# Patient Record
Sex: Male | Born: 1960 | Race: Black or African American | Hispanic: No | Smoking: Former smoker
Health system: Southern US, Community
[De-identification: ages and names within clinical notes are randomized; demographics above are authoritative.]

## PROBLEM LIST (undated history)

## (undated) DIAGNOSIS — I350 Nonrheumatic aortic (valve) stenosis: Secondary | ICD-10-CM

## (undated) DIAGNOSIS — R0600 Dyspnea, unspecified: Secondary | ICD-10-CM

## (undated) DIAGNOSIS — E119 Type 2 diabetes mellitus without complications: Secondary | ICD-10-CM

## (undated) DIAGNOSIS — K000 Anodontia: Secondary | ICD-10-CM

## (undated) DIAGNOSIS — R011 Cardiac murmur, unspecified: Secondary | ICD-10-CM

## (undated) DIAGNOSIS — J45909 Unspecified asthma, uncomplicated: Secondary | ICD-10-CM

## (undated) DIAGNOSIS — J449 Chronic obstructive pulmonary disease, unspecified: Secondary | ICD-10-CM

## (undated) DIAGNOSIS — K08109 Complete loss of teeth, unspecified cause, unspecified class: Secondary | ICD-10-CM

## (undated) DIAGNOSIS — E785 Hyperlipidemia, unspecified: Secondary | ICD-10-CM

## (undated) DIAGNOSIS — A0472 Enterocolitis due to Clostridium difficile, not specified as recurrent: Secondary | ICD-10-CM

## (undated) DIAGNOSIS — I739 Peripheral vascular disease, unspecified: Secondary | ICD-10-CM

## (undated) DIAGNOSIS — F1910 Other psychoactive substance abuse, uncomplicated: Secondary | ICD-10-CM

## (undated) DIAGNOSIS — H409 Unspecified glaucoma: Secondary | ICD-10-CM

## (undated) DIAGNOSIS — L309 Dermatitis, unspecified: Secondary | ICD-10-CM

## (undated) DIAGNOSIS — G709 Myoneural disorder, unspecified: Secondary | ICD-10-CM

## (undated) DIAGNOSIS — J42 Unspecified chronic bronchitis: Secondary | ICD-10-CM

## (undated) DIAGNOSIS — M1990 Unspecified osteoarthritis, unspecified site: Secondary | ICD-10-CM

## (undated) DIAGNOSIS — D649 Anemia, unspecified: Secondary | ICD-10-CM

## (undated) DIAGNOSIS — F419 Anxiety disorder, unspecified: Secondary | ICD-10-CM

## (undated) DIAGNOSIS — I10 Essential (primary) hypertension: Secondary | ICD-10-CM

## (undated) DIAGNOSIS — I2510 Atherosclerotic heart disease of native coronary artery without angina pectoris: Secondary | ICD-10-CM

## (undated) HISTORY — DX: Myoneural disorder, unspecified: G70.9

## (undated) HISTORY — DX: Essential (primary) hypertension: I10

## (undated) HISTORY — DX: Hyperlipidemia, unspecified: E78.5

## (undated) HISTORY — DX: Complete loss of teeth, unspecified cause, unspecified class: K08.109

## (undated) HISTORY — DX: Enterocolitis due to Clostridium difficile, not specified as recurrent: A04.72

## (undated) HISTORY — PX: GLAUCOMA SURGERY: SHX656

## (undated) HISTORY — DX: Dermatitis, unspecified: L30.9

## (undated) HISTORY — PX: APPENDECTOMY: SHX54

## (undated) HISTORY — DX: Other psychoactive substance abuse, uncomplicated: F19.10

## (undated) HISTORY — PX: MULTIPLE TOOTH EXTRACTIONS: SHX2053

## (undated) HISTORY — DX: Anodontia: K00.0

## (undated) HISTORY — PX: SKIN GRAFT: SHX250

---

## 2014-05-21 ENCOUNTER — Encounter (HOSPITAL_COMMUNITY): Payer: Self-pay | Admitting: Emergency Medicine

## 2014-05-21 ENCOUNTER — Emergency Department (HOSPITAL_COMMUNITY): Payer: Medicare (Managed Care)

## 2014-05-21 ENCOUNTER — Emergency Department (HOSPITAL_COMMUNITY)
Admission: EM | Admit: 2014-05-21 | Discharge: 2014-05-21 | Disposition: A | Discharge status: Home / Self Care | Payer: Medicare (Managed Care) | Attending: Emergency Medicine | Admitting: Emergency Medicine

## 2014-05-21 DIAGNOSIS — K529 Noninfective gastroenteritis and colitis, unspecified: Secondary | ICD-10-CM

## 2014-05-21 DIAGNOSIS — F17200 Nicotine dependence, unspecified, uncomplicated: Secondary | ICD-10-CM | POA: Insufficient documentation

## 2014-05-21 DIAGNOSIS — E119 Type 2 diabetes mellitus without complications: Secondary | ICD-10-CM | POA: Insufficient documentation

## 2014-05-21 DIAGNOSIS — Z9089 Acquired absence of other organs: Secondary | ICD-10-CM | POA: Insufficient documentation

## 2014-05-21 DIAGNOSIS — J45909 Unspecified asthma, uncomplicated: Secondary | ICD-10-CM | POA: Diagnosis not present

## 2014-05-21 DIAGNOSIS — R1031 Right lower quadrant pain: Secondary | ICD-10-CM | POA: Diagnosis not present

## 2014-05-21 DIAGNOSIS — K5289 Other specified noninfective gastroenteritis and colitis: Secondary | ICD-10-CM | POA: Insufficient documentation

## 2014-05-21 DIAGNOSIS — R109 Unspecified abdominal pain: Secondary | ICD-10-CM | POA: Diagnosis present

## 2014-05-21 DIAGNOSIS — Z79899 Other long term (current) drug therapy: Secondary | ICD-10-CM | POA: Diagnosis not present

## 2014-05-21 HISTORY — DX: Unspecified asthma, uncomplicated: J45.909

## 2014-05-21 LAB — COMPREHENSIVE METABOLIC PANEL
ALT: 35 U/L (ref 0–53)
AST: 25 U/L (ref 0–37)
Albumin: 3.5 g/dL (ref 3.5–5.2)
Alkaline Phosphatase: 145 U/L — ABNORMAL HIGH (ref 39–117)
Anion gap: 14 (ref 5–15)
BUN: 15 mg/dL (ref 6–23)
CO2: 23 mEq/L (ref 19–32)
Calcium: 8.9 mg/dL (ref 8.4–10.5)
Chloride: 101 mEq/L (ref 96–112)
Creatinine, Ser: 0.89 mg/dL (ref 0.50–1.35)
GFR calc Af Amer: >90 mL/min (ref >90)
GFR calc non Af Amer: >90 mL/min (ref >90)
Glucose, Bld: 259 mg/dL — ABNORMAL HIGH (ref 70–99)
Potassium: 4.7 mEq/L (ref 3.7–5.3)
Sodium: 138 mEq/L (ref 137–147)
Total Bilirubin: 0.2 mg/dL — ABNORMAL LOW (ref 0.3–1.2)
Total Protein: 7.7 g/dL (ref 6.0–8.3)

## 2014-05-21 LAB — URINALYSIS, ROUTINE W REFLEX MICROSCOPIC
Bilirubin Urine: NEGATIVE
Glucose, UA: NEGATIVE mg/dL
Hgb urine dipstick: NEGATIVE
Ketones, ur: NEGATIVE mg/dL
Leukocytes, UA: NEGATIVE
Nitrite: NEGATIVE
Protein, ur: NEGATIVE mg/dL
Specific Gravity, Urine: 1.021 (ref 1.005–1.030)
Urobilinogen, UA: 1 mg/dL (ref 0.0–1.0)
pH: 5.5 (ref 5.0–8.0)

## 2014-05-21 LAB — CBC WITH DIFFERENTIAL/PLATELET
Basophils Absolute: 0 10*3/uL (ref 0.0–0.1)
Basophils Relative: 1 % (ref 0–1)
Eosinophils Absolute: 0.6 10*3/uL (ref 0.0–0.7)
Eosinophils Relative: 13 % — ABNORMAL HIGH (ref 0–5)
HCT: 38.4 % — ABNORMAL LOW (ref 39.0–52.0)
Hemoglobin: 12.6 g/dL — ABNORMAL LOW (ref 13.0–17.0)
Lymphocytes Relative: 23 % (ref 12–46)
Lymphs Abs: 1.1 10*3/uL (ref 0.7–4.0)
MCH: 27.4 pg (ref 26.0–34.0)
MCHC: 32.8 g/dL (ref 30.0–36.0)
MCV: 83.5 fL (ref 78.0–100.0)
Monocytes Absolute: 0.5 10*3/uL (ref 0.1–1.0)
Monocytes Relative: 11 % (ref 3–12)
Neutro Abs: 2.5 10*3/uL (ref 1.7–7.7)
Neutrophils Relative %: 52 % (ref 43–77)
Platelets: 256 10*3/uL (ref 150–400)
RBC: 4.6 MIL/uL (ref 4.22–5.81)
RDW: 12.8 % (ref 11.5–15.5)
WBC: 4.7 10*3/uL (ref 4.0–10.5)

## 2014-05-21 LAB — LIPASE, BLOOD: Lipase: 34 U/L (ref 11–59)

## 2014-05-21 MED ORDER — IBUPROFEN 800 MG PO TABS: 800.0000 mg | ORAL_TABLET | Freq: Three times a day (TID) | ORAL | Status: DC | PRN

## 2014-05-21 MED ORDER — HYDROMORPHONE HCL PF 1 MG/ML IJ SOLN
1.0000 mg | Freq: Once | INTRAMUSCULAR | Status: AC
  Administered 2014-05-21: 1 mg via INTRAVENOUS
  Filled 2014-05-21: qty 1

## 2014-05-21 MED ORDER — IOHEXOL 300 MG/ML  SOLN
100.0000 mL | Freq: Once | INTRAMUSCULAR | Status: AC | PRN
  Administered 2014-05-21: 100 mL via INTRAVENOUS

## 2014-05-21 MED ORDER — METRONIDAZOLE 500 MG PO TABS: 500.0000 mg | ORAL_TABLET | Freq: Two times a day (BID) | ORAL | Status: DC

## 2014-05-21 MED ORDER — CIPROFLOXACIN HCL 500 MG PO TABS: 500.0000 mg | ORAL_TABLET | Freq: Two times a day (BID) | ORAL | Status: DC

## 2014-05-21 MED ORDER — OXYCODONE-ACETAMINOPHEN 5-325 MG PO TABS: 1.0000 | ORAL_TABLET | Freq: Four times a day (QID) | ORAL | Status: DC | PRN

## 2014-05-21 MED ORDER — SODIUM CHLORIDE 0.9 % IV BOLUS (SEPSIS)
1000.0000 mL | Freq: Once | INTRAVENOUS | Status: AC
  Administered 2014-05-21: 1000 mL via INTRAVENOUS

## 2014-05-21 MED ORDER — ONDANSETRON HCL 4 MG/2ML IJ SOLN
4.0000 mg | Freq: Once | INTRAMUSCULAR | Status: AC
  Administered 2014-05-21: 4 mg via INTRAVENOUS
  Filled 2014-05-21: qty 2

## 2014-05-21 NOTE — ED Notes (Signed)
Pt continues to be monitored by blood pressure and pulse ox. Pt asked to provide urine specimen. Pt stated he could not provide one at this time. Pt has urinal at bedside.

## 2014-05-21 NOTE — Discharge Instructions (Signed)

## 2014-05-21 NOTE — ED Provider Notes (Signed)
Medical screening examination/treatment/procedure(s) were performed by non-physician practitioner and as supervising physician I was immediately available for consultation/collaboration.   EKG Interpretation None        Hoy Morn, MD 05/21/14 2209

## 2014-05-21 NOTE — ED Notes (Signed)
Pt placed into gown and on monitor upon arrival to room. Pt monitored by blood pressure and pulse ox.  

## 2014-05-21 NOTE — ED Notes (Addendum)
Pt reportgs having abd pain for a long time with periods of it getting severe; pt crying and guarding at triage; pt reports being seen at [place] [facility] yesterday for same

## 2014-05-21 NOTE — ED Provider Notes (Signed)
CSN: [id number]     Arrival date & time 05/21/14  1305 History   First MD Initiated Contact with Patient 05/21/14 1328     Chief Complaint  Patient presents with  . Abdominal Pain     (Consider location/radiation/quality/duration/timing/severity/associated sxs/prior Treatment) HPI Patient presents to the emergency department with right lower abdominal pain in the area where his appendectomy scar is located.  Patient, states the pain is a burning type pain.  Patient, states, that he has not had fever, chest pain, shortness of breath, weakness, dizziness back pain, dysuria, nausea, vomiting, diarrhea, blurred vision, headache, or ectopy.  The patient, states, that he did not take any medications prior to arrival.  He states he was seen one month ago in [facility] for similar symptoms.  He states the pain medicine, again helped, but he ran out of the medicine Past Medical History  Diagnosis Date  . Asthma   . Diabetes mellitus without complication    Past Surgical History  Procedure Laterality Date  . Skin graft    . Appendectomy     History reviewed. No pertinent family history. History  Substance Use Topics  . Smoking status: Current Every Day Smoker -- 0.50 packs/day    Types: Cigarettes  . Smokeless tobacco: Not on file  . Alcohol Use: No    Review of Systems  All other systems negative except as documented in the HPI. All pertinent positives and negatives as reviewed in the HPI.  Allergies  Review of patient's allergies indicates no known allergies.  Home Medications   Prior to Admission medications   Medication Sig Start Date End Date Taking? Authorizing Provider  albuterol (PROVENTIL HFA;VENTOLIN HFA) 108 (90 BASE) MCG/ACT inhaler Inhale 2 puffs into the lungs every 6 (six) hours as needed for wheezing or shortness of breath.   Yes Historical Provider, MD  docusate sodium (COLACE) 100 MG capsule Take 200 mg by mouth daily as needed for mild constipation.   Yes  Historical Provider, MD  glipiZIDE (GLUCOTROL) 10 MG tablet Take 10 mg by mouth 2 (two) times daily.   Yes Historical Provider, MD  metFORMIN (GLUCOPHAGE) 1000 MG tablet Take 1,000 mg by mouth 2 (two) times daily with a meal.   Yes Historical Provider, MD  pregabalin (LYRICA) 75 MG capsule Take 75 mg by mouth 2 (two) times daily.   Yes Historical Provider, MD  Sennosides (LAXATIVE) 25 MG TABS Take 1 tablet by mouth daily as needed (for constipation).   Yes Historical Provider, MD   BP 138/70  Pulse 87  Temp(Src) 98.7 F (37.1 C) (Oral)  Resp 16  Ht 6\' 3"  (1.905 m)  Wt 208 lb (94.348 kg)  BMI 26.00 kg/m2  SpO2 97% Physical Exam  Nursing note and vitals reviewed. Constitutional: He appears well-developed and well-nourished. No distress.  HENT:  Head: Normocephalic and atraumatic.  Mouth/Throat: Oropharynx is clear and moist.  Eyes: Pupils are equal, round, and reactive to light.  Neck: Normal range of motion. Neck supple.  Cardiovascular: Normal rate, regular rhythm and normal heart sounds.  Exam reveals no gallop and no friction rub.   No murmur heard. Pulmonary/Chest: Effort normal and breath sounds normal. No respiratory distress.  Abdominal: Soft. Normal appearance and bowel sounds are normal. He exhibits no distension. There is tenderness. There is no rebound and no guarding. No hernia.    Skin: Skin is warm and dry. No rash noted. No erythema.    ED Course  Procedures (including critical care  time) Labs Review Labs Reviewed  CBC WITH DIFFERENTIAL - Abnormal; Notable for the following:    Hemoglobin 12.6 (*)    HCT 38.4 (*)    Eosinophils Relative 13 (*)    All other components within normal limits  COMPREHENSIVE METABOLIC PANEL - Abnormal; Notable for the following:    Glucose, Bld 259 (*)    Alkaline Phosphatase 145 (*)    Total Bilirubin 0.2 (*)    All other components within normal limits  LIPASE, BLOOD  URINALYSIS, ROUTINE W REFLEX MICROSCOPIC    Imaging  Review No results found.  The patient has relief of his symptoms following pain medication.  He is awaiting CT scan.   Brent General, PA-C 05/21/14 [phone]

## 2014-05-21 NOTE — ED Notes (Signed)
Pt continues to be monitored by blood pressure and pulse ox. Pt asked to provide urine specimen pt stated he could not provide one at this time. Pt provided with urinal.

## 2014-05-21 NOTE — ED Provider Notes (Signed)
[name]  Pain has been ongoing for 1 month, persistent, burning sensation, nonradiating, progressively worse now that he does not have his pain medication. No other associated symptoms. CT scan ordered.  Patient lives in [facility].  6:53 PM CT scan demonstrated a mild bowel wall thickening of the descending colon and sigmoid colon which is nonspecific but suggestive of a mild segmental colitis. Ischemic colitis is also on the differential although the mesenteric vasculature appear quite patent. With this specific finding, patient will be discharged with Cipro and Flagyl, pain medication, and recommend close followup with PCP for further care. At this time patient is stable for discharge. Return precautions discussed.  Pt has normal renal function and likely able to tolerates cipro.  BP 128/78  Pulse 81  Temp(Src) 98.7 F (37.1 C) (Oral)  Resp 16  Ht 6\' 3"  (1.905 m)  Wt 208 lb (94.348 kg)  BMI 26.00 kg/m2  SpO2 97%  I have reviewed nursing notes and vital signs. I personally reviewed the imaging tests through PACS system  I reviewed available ER/hospitalization records thought the EMR  Results for orders placed during the hospital encounter of 05/21/14  CBC WITH DIFFERENTIAL      Result Value Ref Range   WBC 4.7  4.0 - 10.5 K/uL   RBC 4.60  4.22 - 5.81 MIL/uL   Hemoglobin 12.6 (*) 13.0 - 17.0 g/dL   HCT 38.4 (*) 39.0 - 52.0 %   MCV 83.5  78.0 - 100.0 fL   MCH 27.4  26.0 - 34.0 pg   MCHC 32.8  30.0 - 36.0 g/dL   RDW 12.8  11.5 - 15.5 %   Platelets 256  150 - 400 K/uL   Neutrophils Relative % 52  43 - 77 %   Neutro Abs 2.5  1.7 - 7.7 K/uL   Lymphocytes Relative 23  12 - 46 %   Lymphs Abs 1.1  0.7 - 4.0 K/uL   Monocytes Relative 11  3 - 12 %   Monocytes Absolute 0.5  0.1 - 1.0 K/uL   Eosinophils Relative 13 (*) 0 - 5 %   Eosinophils Absolute 0.6  0.0 - 0.7 K/uL   Basophils Relative 1  0 - 1 %   Basophils  Absolute 0.0  0.0 - 0.1 K/uL  COMPREHENSIVE METABOLIC PANEL      Result Value Ref Range   Sodium 138  137 - 147 mEq/L   Potassium 4.7  3.7 - 5.3 mEq/L   Chloride 101  96 - 112 mEq/L   CO2 23  19 - 32 mEq/L   Glucose, Bld 259 (*) 70 - 99 mg/dL   BUN 15  6 - 23 mg/dL   Creatinine, Ser 0.89  0.50 - 1.35 mg/dL   Calcium 8.9  8.4 - 10.5 mg/dL   Total Protein 7.7  6.0 - 8.3 g/dL   Albumin 3.5  3.5 - 5.2 g/dL   AST 25  0 - 37 U/L   ALT 35  0 - 53 U/L   Alkaline Phosphatase 145 (*) 39 - 117 U/L   Total Bilirubin 0.2 (*) 0.3 - 1.2 mg/dL   GFR calc non Af Amer >90  >90 mL/min   GFR calc Af Amer >90  >90 mL/min   Anion gap 14  5 - 15  LIPASE, BLOOD      Result Value Ref Range   Lipase 34  11 -  59 U/L  URINALYSIS, ROUTINE W REFLEX MICROSCOPIC      Result Value Ref Range   Color, Urine YELLOW  YELLOW   APPearance CLEAR  CLEAR   Specific Gravity, Urine 1.021  1.005 - 1.030   pH 5.5  5.0 - 8.0   Glucose, UA NEGATIVE  NEGATIVE mg/dL   Hgb urine dipstick NEGATIVE  NEGATIVE   Bilirubin Urine NEGATIVE  NEGATIVE   Ketones, ur NEGATIVE  NEGATIVE mg/dL   Protein, ur NEGATIVE  NEGATIVE mg/dL   Urobilinogen, UA 1.0  0.0 - 1.0 mg/dL   Nitrite NEGATIVE  NEGATIVE   Leukocytes, UA NEGATIVE  NEGATIVE   Ct Abdomen Pelvis W Contrast  05/21/2014   CLINICAL DATA:  Severe abdominal pain,  EXAM: CT ABDOMEN AND PELVIS WITH CONTRAST  TECHNIQUE: Multidetector CT imaging of the abdomen and pelvis was performed using the standard protocol following bolus administration of intravenous contrast.  CONTRAST:  167mL OMNIPAQUE IOHEXOL 300 MG/ML  SOLN  COMPARISON:  CT 05/07/2014  FINDINGS: Lung bases are clear.  No pericardial fluid.  No focal hepatic lesion. Gallbladder is collapsed. The pancreas, spleen, adrenal glands, kidneys are normal.  The stomach, small bowel, and cecum are normal. Colon and rectosigmoid colon are normal. There is mild thickening of the bowel wall involving the mid descending colon and sigmoid  colon. Bowel wall thickening measure 4 to 6 mm on image 66.  Abdominal aorta normal caliber. The SMA and celiac trunk are widely patent. The IMA is patent. No retroperitoneal periportal lymphadenopathy.  No free fluid the pelvis. Moderate volume of stool in the rectum. Prostate gland and bladder normal. No pelvic lymphadenopathy. No inguinal hernia.  IMPRESSION: 1. Mild bowel wall thickening of the descending colon adn sigmoid colon is nonspecific but is suggestive of mild segmental colitis. Differential would include inflammatory infectious colitis including inflammatory bowel disease. Ischemic colitis is also in the differential although the mesenteric vasculature appears widely patent. 2. Gallbladder is collapsed. 3. Patient status post appendectomy.   Electronically Signed   By: Suzy Bouchard M.D.   On: 05/21/2014 18:31      Domenic Moras, PA-C 05/21/14 1859

## 2014-05-21 NOTE — ED Notes (Signed)
Pts vital signs updated pts iv removed pt awaiting discharge paperwork at bedside.

## 2014-05-21 NOTE — ED Notes (Signed)
Pt remains monitored by blood pressure and pulse ox.  

## 2014-05-22 NOTE — ED Provider Notes (Signed)
Medical screening examination/treatment/procedure(s) were performed by non-physician practitioner and as supervising physician I was immediately available for consultation/collaboration.   EKG Interpretation None       Orlie Dakin, MD 05/22/14 [phone]

## 2014-05-24 ENCOUNTER — Telehealth (HOSPITAL_BASED_OUTPATIENT_CLINIC_OR_DEPARTMENT_OTHER): Payer: Self-pay | Admitting: Emergency Medicine

## 2014-06-04 ENCOUNTER — Emergency Department (HOSPITAL_COMMUNITY)
Admission: EM | Admit: 2014-06-04 | Discharge: 2014-06-05 | Disposition: A | Discharge status: Home / Self Care | Payer: Medicare (Managed Care) | Attending: Emergency Medicine | Admitting: Emergency Medicine

## 2014-06-04 ENCOUNTER — Encounter (HOSPITAL_COMMUNITY): Payer: Self-pay | Admitting: Emergency Medicine

## 2014-06-04 DIAGNOSIS — E119 Type 2 diabetes mellitus without complications: Secondary | ICD-10-CM | POA: Diagnosis not present

## 2014-06-04 DIAGNOSIS — Z792 Long term (current) use of antibiotics: Secondary | ICD-10-CM | POA: Insufficient documentation

## 2014-06-04 DIAGNOSIS — R1031 Right lower quadrant pain: Secondary | ICD-10-CM | POA: Insufficient documentation

## 2014-06-04 DIAGNOSIS — F17200 Nicotine dependence, unspecified, uncomplicated: Secondary | ICD-10-CM | POA: Diagnosis not present

## 2014-06-04 DIAGNOSIS — Z79899 Other long term (current) drug therapy: Secondary | ICD-10-CM | POA: Diagnosis not present

## 2014-06-04 DIAGNOSIS — J45909 Unspecified asthma, uncomplicated: Secondary | ICD-10-CM | POA: Insufficient documentation

## 2014-06-04 DIAGNOSIS — Z8719 Personal history of other diseases of the digestive system: Secondary | ICD-10-CM | POA: Insufficient documentation

## 2014-06-04 DIAGNOSIS — R109 Unspecified abdominal pain: Secondary | ICD-10-CM

## 2014-06-04 LAB — CBC WITH DIFFERENTIAL/PLATELET
Basophils Absolute: 0 10*3/uL (ref 0.0–0.1)
Basophils Relative: 1 % (ref 0–1)
Eosinophils Absolute: 0.5 10*3/uL (ref 0.0–0.7)
Eosinophils Relative: 12 % — ABNORMAL HIGH (ref 0–5)
HCT: 39 % (ref 39.0–52.0)
Hemoglobin: 12.8 g/dL — ABNORMAL LOW (ref 13.0–17.0)
Lymphocytes Relative: 19 % (ref 12–46)
Lymphs Abs: 0.8 10*3/uL (ref 0.7–4.0)
MCH: 26.9 pg (ref 26.0–34.0)
MCHC: 32.8 g/dL (ref 30.0–36.0)
MCV: 81.9 fL (ref 78.0–100.0)
Monocytes Absolute: 0.6 10*3/uL (ref 0.1–1.0)
Monocytes Relative: 13 % — ABNORMAL HIGH (ref 3–12)
Neutro Abs: 2.5 10*3/uL (ref 1.7–7.7)
Neutrophils Relative %: 57 % (ref 43–77)
Platelets: 280 10*3/uL (ref 150–400)
RBC: 4.76 MIL/uL (ref 4.22–5.81)
RDW: 12.7 % (ref 11.5–15.5)
WBC: 4.4 10*3/uL (ref 4.0–10.5)

## 2014-06-04 LAB — COMPREHENSIVE METABOLIC PANEL
ALT: 44 U/L (ref 0–53)
AST: 32 U/L (ref 0–37)
Albumin: 3.5 g/dL (ref 3.5–5.2)
Alkaline Phosphatase: 135 U/L — ABNORMAL HIGH (ref 39–117)
Anion gap: 12 (ref 5–15)
BUN: 10 mg/dL (ref 6–23)
CO2: 26 mEq/L (ref 19–32)
Calcium: 9.1 mg/dL (ref 8.4–10.5)
Chloride: 98 mEq/L (ref 96–112)
Creatinine, Ser: 0.81 mg/dL (ref 0.50–1.35)
GFR calc Af Amer: >90 mL/min (ref >90)
GFR calc non Af Amer: >90 mL/min (ref >90)
Glucose, Bld: 138 mg/dL — ABNORMAL HIGH (ref 70–99)
Potassium: 4.3 mEq/L (ref 3.7–5.3)
Sodium: 136 mEq/L — ABNORMAL LOW (ref 137–147)
Total Bilirubin: 0.2 mg/dL — ABNORMAL LOW (ref 0.3–1.2)
Total Protein: 7.7 g/dL (ref 6.0–8.3)

## 2014-06-04 LAB — URINALYSIS, ROUTINE W REFLEX MICROSCOPIC
Bilirubin Urine: NEGATIVE
Glucose, UA: NEGATIVE mg/dL
Hgb urine dipstick: NEGATIVE
Ketones, ur: 15 mg/dL — AB
Leukocytes, UA: NEGATIVE
Nitrite: NEGATIVE
Protein, ur: NEGATIVE mg/dL
Specific Gravity, Urine: 1.029 (ref 1.005–1.030)
Urobilinogen, UA: 0.2 mg/dL (ref 0.0–1.0)
pH: 5.5 (ref 5.0–8.0)

## 2014-06-04 LAB — LIPASE, BLOOD: Lipase: 20 U/L (ref 11–59)

## 2014-06-04 MED ORDER — KETOROLAC TROMETHAMINE 15 MG/ML IJ SOLN
15.0000 mg | Freq: Once | INTRAMUSCULAR | Status: AC
  Administered 2014-06-04: 15 mg via INTRAMUSCULAR
  Filled 2014-06-04: qty 1

## 2014-06-04 MED ORDER — OXYCODONE-ACETAMINOPHEN 5-325 MG PO TABS
1.0000 | ORAL_TABLET | Freq: Once | ORAL | Status: AC
  Administered 2014-06-04: 1 via ORAL
  Filled 2014-06-04: qty 1

## 2014-06-04 MED ORDER — HYDROMORPHONE HCL PF 1 MG/ML IJ SOLN
1.0000 mg | Freq: Once | INTRAMUSCULAR | Status: AC
  Administered 2014-06-04: 1 mg via INTRAMUSCULAR
  Filled 2014-06-04: qty 1

## 2014-06-04 MED ORDER — TRAMADOL HCL 50 MG PO TABS: 50.0000 mg | ORAL_TABLET | Freq: Four times a day (QID) | ORAL | Status: DC | PRN

## 2014-06-04 MED ORDER — FENTANYL CITRATE 0.05 MG/ML IJ SOLN
50.0000 ug | Freq: Once | INTRAMUSCULAR | Status: AC
  Administered 2014-06-04: 50 ug via NASAL
  Filled 2014-06-04: qty 2

## 2014-06-04 NOTE — ED Notes (Signed)
Pt reports being seen here on 8/14 for RLQ pain, ct scan showed colitis. Reports taking all meds as prescribed but still having severe pain and now diarrhea.

## 2014-06-04 NOTE — ED Provider Notes (Signed)
CSN: [id number]     Arrival date & time 06/04/14  1658 History   First MD Initiated Contact with Patient 06/04/14 2229     Chief Complaint  Patient presents with  . Abdominal Pain     (Consider location/radiation/quality/duration/timing/severity/associated sxs/prior Treatment) HPI  53 year old male with abdominal pain. Right sided. Ongoing for over a month. Patient was recently seen in the emergency room and a CT abdomen pelvis which was significant for segmental colitis. He was treated with pain medication and antibiotics. Reports that the pain initially improved somewhat but not completely go away. He is out of his pain medication and is requesting more. No fevers or chills. No vomiting or diarrhea. No urinary complaints. Patient reports recently relocated from [place]. No established local care.  Past Medical History  Diagnosis Date  . Asthma   . Diabetes mellitus without complication    Past Surgical History  Procedure Laterality Date  . Skin graft    . Appendectomy     History reviewed. No pertinent family history. History  Substance Use Topics  . Smoking status: Current Every Day Smoker -- 0.50 packs/day    Types: Cigarettes  . Smokeless tobacco: Not on file  . Alcohol Use: No    Review of Systems  All systems reviewed and negative, other than as noted in HPI.   Allergies  Review of patient's allergies indicates no known allergies.  Home Medications   Prior to Admission medications   Medication Sig Start Date End Date Taking? Authorizing Provider  albuterol (PROVENTIL HFA;VENTOLIN HFA) 108 (90 BASE) MCG/ACT inhaler Inhale 2 puffs into the lungs every 6 (six) hours as needed for wheezing or shortness of breath.   Yes Historical Provider, MD  docusate sodium (COLACE) 100 MG capsule Take 200 mg by mouth daily as needed for mild constipation.   Yes Historical Provider, MD  glipiZIDE (GLUCOTROL) 10 MG tablet Take 10 mg by mouth 2 (two) times daily.   Yes  Historical Provider, MD  ibuprofen (ADVIL,MOTRIN) 800 MG tablet Take 800 mg by mouth every 8 (eight) hours as needed for moderate pain. 05/21/14  Yes Resa Miner Lawyer, PA-C  metFORMIN (GLUCOPHAGE) 1000 MG tablet Take 1,000 mg by mouth 2 (two) times daily with a meal.   Yes Historical Provider, MD  oxyCODONE-acetaminophen (PERCOCET/ROXICET) 5-325 MG per tablet Take 1 tablet by mouth every 6 (six) hours as needed for severe pain. 05/21/14  Yes Resa Miner Lawyer, PA-C  pregabalin (LYRICA) 75 MG capsule Take 75 mg by mouth 2 (two) times daily.   Yes Historical Provider, MD  Sennosides (LAXATIVE) 25 MG TABS Take 1 tablet by mouth daily as needed (for constipation).   Yes Historical Provider, MD  ciprofloxacin (CIPRO) 500 MG tablet Take 500 mg by mouth 2 (two) times daily. Completed course of medication on 06-03-14 05/21/14   Domenic Moras, PA-C  metroNIDAZOLE (FLAGYL) 500 MG tablet Take 500 mg by mouth 2 (two) times daily. Completed course of medication on 06-03-14 05/21/14   Domenic Moras, PA-C   BP 144/84  Pulse 78  Temp(Src) 99.2 F (37.3 C) (Oral)  Resp 20  SpO2 100% Physical Exam  Nursing note and vitals reviewed. Constitutional: He appears well-developed and well-nourished. No distress.  Laying in bed. No acute distress. Talkative/pleasant  HENT:  Head: Normocephalic and atraumatic.  Eyes: Conjunctivae are normal. Right eye exhibits no discharge. Left eye exhibits no discharge.  Neck: Neck supple.  Cardiovascular: Normal rate, regular rhythm and normal heart sounds.  Exam reveals  no gallop and no friction rub.   No murmur heard. Pulmonary/Chest: Effort normal and breath sounds normal. No respiratory distress.  Abdominal: Soft. He exhibits no distension. There is tenderness.  Mild right-sided abdominal tenderness without rebound or guarding. When distracted patient with talking and asking more questions palpation in same area did not illicit any obvious response.   Genitourinary:  No CVA  tenderness  Musculoskeletal: He exhibits no edema and no tenderness.  Neurological: He is alert.  Skin: Skin is warm and dry.  Psychiatric: He has a normal mood and affect. His behavior is normal. Thought content normal.    ED Course  Procedures (including critical care time) Labs Review Labs Reviewed  CBC WITH DIFFERENTIAL - Abnormal; Notable for the following:    Hemoglobin 12.8 (*)    Monocytes Relative 13 (*)    Eosinophils Relative 12 (*)    All other components within normal limits  COMPREHENSIVE METABOLIC PANEL - Abnormal; Notable for the following:    Sodium 136 (*)    Glucose, Bld 138 (*)    Alkaline Phosphatase 135 (*)    Total Bilirubin 0.2 (*)    All other components within normal limits  URINALYSIS, ROUTINE W REFLEX MICROSCOPIC - Abnormal; Notable for the following:    Color, Urine AMBER (*)    Ketones, ur 15 (*)    All other components within normal limits  LIPASE, BLOOD    Imaging Review No results found.   EKG Interpretation None      MDM   Final diagnoses:  Abdominal pain, unspecified abdominal location    53 year old male with abdominal pain. Recently seen for the same complaints. CT then significant for colitis. He reports compliance with his medications that his pain has been persistent. He is essentially here for pain medication. He is afebrile. He appears well. He does have some tenderness on exam but is actually distractible. He requested something to eat in the emergency room and had a [place] sandwich and ate it w/o complaint. Whether this is continued pain from previously noted colitis versus something else I am not sure. My suspicion for abscess, perforation or other potential MRSA pathology is very low. Overall he appears very well. His workup today has been pretty reassuring. Do not feel that he needs further imaging at this point. Will treat his pain. Return precautions were discussed. I feel is appropriate for discharge at this time.  Virgel Manifold, MD 06/09/14 1343

## 2014-06-04 NOTE — Discharge Instructions (Signed)
Abdominal Pain Many things can cause abdominal pain. Usually, abdominal pain is not caused by a disease and will improve without treatment. It can often be observed and treated at home. Your health care provider will do a physical exam and possibly order blood tests and X-rays to help determine the seriousness of your pain. However, in many cases, more time must pass before a clear cause of the pain can be found. Before that point, your health care provider may not know if you need more testing or further treatment. HOME CARE INSTRUCTIONS  Monitor your abdominal pain for any changes. The following actions may help to alleviate any discomfort you are experiencing:  Only take over-the-counter or prescription medicines as directed by your health care provider.  Do not take laxatives unless directed to do so by your health care provider.  Try a clear liquid diet (broth, tea, or water) as directed by your health care provider. Slowly move to a bland diet as tolerated. SEEK MEDICAL CARE IF:  You have unexplained abdominal pain.  You have abdominal pain associated with nausea or diarrhea.  You have pain when you urinate or have a bowel movement.  You experience abdominal pain that wakes you in the night.  You have abdominal pain that is worsened or improved by eating food.  You have abdominal pain that is worsened with eating fatty foods.  You have a fever. SEEK IMMEDIATE MEDICAL CARE IF:   Your pain does not go away within 2 hours.  You keep throwing up (vomiting).  Your pain is felt only in portions of the abdomen, such as the right side or the left lower portion of the abdomen.  You pass bloody or black tarry stools. MAKE SURE YOU:  Understand these instructions.   Will watch your condition.   Will get help right away if you are not doing well or get worse.  Document Released: 07/04/2005 Document Revised: 09/29/2013 Document Reviewed: 06/03/2013 [facility] Patient Information  2015 [place], [place]. This information is not intended to replace advice given to you by your health care provider. Make sure you discuss any questions you have with your health care provider.   Emergency Department Resource Guide 1) Find a Doctor and Pay Out of Pocket Although you won't have to find out who is covered by your insurance plan, it is a good idea to ask around and get recommendations. You will then need to call the office and see if the doctor you have chosen will accept you as a new patient and what types of options they offer for patients who are self-pay. Some doctors offer discounts or will set up payment plans for their patients who do not have insurance, but you will need to ask so you aren't surprised when you get to your appointment.  2) Contact Your Local Health Department Not all health departments have doctors that can see patients for sick visits, but many do, so it is worth a call to see if yours does. If you don't know where your local health department is, you can check in your phone book. The CDC also has a tool to help you locate your state's health department, and many state websites also have listings of all of their local health departments.  3) Find a [facility] If your illness is not likely to be very severe or complicated, you may want to try a walk in clinic. These are popping up all over the country in pharmacies, drugstores, and shopping centers. They're  usually staffed by nurse practitioners or physician assistants that have been trained to treat common illnesses and complaints. They're usually fairly quick and inexpensive. However, if you have serious medical issues or chronic medical problems, these are probably not your best option.  No Primary Care Doctor: - Call Health Connect at  [phone] - they can help you locate a primary care doctor that  accepts your insurance, provides certain services, etc. - Physician Referral Service- [phone]  Chronic  Pain Problems: Organization         Address  Phone   Notes  [facility]  [phone] Patients need to be referred by their primary care doctor.   Medication Assistance: Organization         Address  Phone   Notes  [facility] Medication [facility] [address] [place], [place] [address] [phone] --Must be a resident of [facility] -- Must have NO insurance coverage whatsoever (no Medicaid/ Medicare, etc.) -- The pt. MUST have a primary care doctor that directs their care regularly and follows them in the community   [facility]ist  [phone]   [employer]  [phone]    Agencies that provide inexpensive medical care: Organization         Address  Phone   Notes  [place]  [phone]   [facility]    [phone]   [facility] [place], [place] [address] [phone]   [place] [address] [place] [phone]   Planned Parenthood    [phone]   [facility]    [phone]   [place] and [place] [address], [place] Phone:  [phone], Fax:  [phone] Hours of Operation:  9 am - 6 pm, M-F.  Also accepts Medicaid/Medicare and self-pay.  [facility] for [place], [address], [place] Phone: [phone], Fax: [phone]. Hours of Operation:  8:30 am - 5:30 pm, M-F.  Also accepts Medicaid and self-pay.  [facility] [address], [place] Phone: [phone]   [place], [place], [place] [phone], Ext. [phone] Mondays & Thursdays: 7-9 AM.  First 15 patients are seen on a first come, first serve basis.    [place] Providers:  Organization         Address  Phone   Notes  [facility] [address], [place] [phone] Also  accepts self-pay patients.  [facility] [id number] [place], [place]  [phone]   [place], [address], [place] [phone]   [facility] [address] [place] [phone]   Lucianne Lei [address], [place]   [phone] Only accepts [place] Access [place] patients after they have their name applied to their card.   Self-Pay (no insurance) in [facility]:  Organization         Address  Phone   Notes  Sickle Cell Patients, [facility] Internal Medicine [place] [phone]   [facility] [place] [phone]   [facility] Urgent [place]  [place], [address], [place] [phone]   [facility]/Dr. Osei-Bonsu  [address], [place] or [address], [place] [phone] Phone number for both [place] and [place] locations is the same.  [facility] [address], [place] [phone]   [facility] [address], [place] or [address] [phone]   [facility] [address] [place] [phone], phone; [phone], fax Sees patients 1st and 3rd Saturday of every month.  Must not qualify for public or private insurance (i.e. Medicaid, Medicare, [place] Health Choice, Veterans' Benefits)  Household income should be no more than 200% of the poverty level The clinic cannot treat you if you are pregnant or think you are pregnant  Sexually transmitted diseases are not treated at the clinic.    Dental Care: Organization         Address  Phone  Notes  [facility] of [facility] [place] [phone] Accepts children up to age 33 who are enrolled in [place] or [place]; pregnant  women with a Medicaid card; and children who have applied for Medicaid or [place] Health Choice, but were declined, whose parents can pay a reduced fee at time of service.  [facility] Department of [facility]  [address], [place] [phone] Accepts children up to age 48 who are enrolled in [place] or [place]; pregnant women with a Medicaid card; and children who have applied for Medicaid or [place] Health Choice, but were declined, whose parents can pay a reduced fee at time of service.  [place] Adult Dental Access PROGRAM  [place] [phone] Patients are seen by appointment only. Walk-ins are not accepted. [place] will see patients 42 years of age and older. Monday - Tuesday (8am-5pm) Most Wednesdays (8:30-5pm) $30 per visit, cash only  [facility] Adult Dental Access PROGRAM  [address], [facility] [phone] Patients are seen by appointment only. Walk-ins are not accepted. [place] will see patients 31 years of age and older. One Wednesday Evening (Monthly: Volunteer Based).  $30 per visit, cash only  [place]  [phone] for adults; Children under age 65, call Graduate Pediatric Dentistry at [phone]. Children aged 5-14, please call [phone] to request a pediatric application.  Dental services are provided in all areas of dental care including fillings, crowns and bridges, complete and partial dentures, implants, gum treatment, root canals, and extractions. Preventive care is also provided. Treatment is provided to both adults and children. Patients are selected via a lottery and there is often a waiting list.   [facility] [address], [place]  [phone] [email or web address]   [facility] [address] [place], [place] [phone], Ext. [phone] Second and Fourth Thursday of each month, opens at 6:30 AM; Clinic ends at 9 AM.  Patients are  seen on a first-come first-served basis, and a limited number are seen during each clinic.   [facility]  [address] Hillard Danker [place], [place] [phone]   Eligibility Requirements You must have lived in [place], [place], or [place] counties for at least the last three months.   You cannot be eligible for state or federal sponsored [employer], including [employer], [place], or [employer].   You generally cannot be eligible for healthcare insurance through  your employer.    How to apply: Eligibility screenings are held every Tuesday and Wednesday afternoon from 1:00 pm until 4:00 pm. You do not need an appointment for the interview!  [facility] [address] [place], [place]   [place]  [place] Department  [place]  [phone]    Behavioral Health Resources in the Community: Intensive Outpatient Programs Organization         Address  Phone  Notes  [place]. [address] [place], [place] [phone]   [facility] Outpatient [address] [place], [place]   ADS: Alcohol & Drug Svcs [address], [place], [place]   [place] [address],  [place], [place] or [phone]   Substance Abuse Resources Organization         Address  Phone  Notes  Alcohol and Drug Services  [phone]   [place]  [phone]   [place]   Chinita Pester  [phone]   Residential & Outpatient Substance Abuse Program  [phone]   Psychological Services Organization         Address  Phone  Notes  [facility] [place]  [place]  [phone]   [place] [address], [place] or [phone]    Mobile Crisis  Teams Organization         Address  Phone  Notes  [facility], Mobile Crisis Care Unit  [phone]   [facility]ive Psychotherapeutic Services  [address] [place], [place]   [place] [address], [place] [phone]    Self-Help/Support Groups Organization         Address  Phone             Notes  [place]. of [place] - variety of support groups  [place] Call for more information  Narcotics Anonymous (NA), [facility] [address], [employer]   2 meetings at this location   Special educational needs teacher         Address  Phone  Notes  [facility]idential [facility] [place],    [place]  [phone]   [facility]  [address], [place] [phone], [place], [place]   [place], [place] [phone] Admissions: 8am-3pm M-F  Incentives Substance [place] [address],    [place], [place] [phone]   [facility] [address] [place], [place], [place]   [facility] [address],  [place], [place]   [facility] - Intensive Outpatient [address], [place], [place]   [facility].,  [place], [place] [phone] or [phone]   [facility] Services (RTS) [address] [place], [place] Accepts Medicaid  [facility] [place] [address],  [place] [phone] Substance Abuse/Addiction Treatment   [facility] Organization         Address  Phone  Notes  [facility]  [phone]   Domenic Schwab, PhD [address] [place], [place]   [phone] or [phone]   [place], [place] [phone]  [facility] [address], [place], [place] [phone]  Insurance/Medicaid/sponsorship through [facility]  °[facility] [address]                                    [place], [place] [phone] Therapy/tele-psych/case  °[facility] [address]  ° [place], [place] [phone]    °Dr. Arfeen  [phone]   °[facility] [place]  [employer] [place] [facility] 1) [address],  °2) [address], [place] °3)  [address], [place] [phone] °([phone] ° °([phone]   °[place] Child Abuse Hotline [phone]) [phone] or ([phone] (After Hours)    ° ° ° ° °

## 2014-09-06 ENCOUNTER — Emergency Department (HOSPITAL_COMMUNITY): Payer: Medicare (Managed Care)

## 2014-09-06 ENCOUNTER — Encounter (HOSPITAL_COMMUNITY): Payer: Self-pay | Admitting: *Deleted

## 2014-09-06 ENCOUNTER — Inpatient Hospital Stay (HOSPITAL_COMMUNITY)
Admission: EM | Admit: 2014-09-06 | Discharge: 2014-09-09 | DRG: 373 | Disposition: A | Discharge status: Home / Self Care | Payer: Medicare (Managed Care) | Attending: Internal Medicine | Admitting: Internal Medicine

## 2014-09-06 DIAGNOSIS — I10 Essential (primary) hypertension: Secondary | ICD-10-CM | POA: Diagnosis present

## 2014-09-06 DIAGNOSIS — A047 Enterocolitis due to Clostridium difficile: Secondary | ICD-10-CM | POA: Diagnosis present

## 2014-09-06 DIAGNOSIS — R1013 Epigastric pain: Secondary | ICD-10-CM | POA: Diagnosis not present

## 2014-09-06 DIAGNOSIS — E119 Type 2 diabetes mellitus without complications: Secondary | ICD-10-CM

## 2014-09-06 DIAGNOSIS — Z79899 Other long term (current) drug therapy: Secondary | ICD-10-CM

## 2014-09-06 DIAGNOSIS — K56609 Unspecified intestinal obstruction, unspecified as to partial versus complete obstruction: Secondary | ICD-10-CM

## 2014-09-06 DIAGNOSIS — A0472 Enterocolitis due to Clostridium difficile, not specified as recurrent: Secondary | ICD-10-CM | POA: Diagnosis present

## 2014-09-06 DIAGNOSIS — R109 Unspecified abdominal pain: Secondary | ICD-10-CM

## 2014-09-06 DIAGNOSIS — F17210 Nicotine dependence, cigarettes, uncomplicated: Secondary | ICD-10-CM | POA: Diagnosis present

## 2014-09-06 DIAGNOSIS — J45909 Unspecified asthma, uncomplicated: Secondary | ICD-10-CM | POA: Diagnosis present

## 2014-09-06 DIAGNOSIS — K529 Noninfective gastroenteritis and colitis, unspecified: Secondary | ICD-10-CM

## 2014-09-06 DIAGNOSIS — J4520 Mild intermittent asthma, uncomplicated: Secondary | ICD-10-CM

## 2014-09-06 LAB — CBC WITH DIFFERENTIAL/PLATELET
Basophils Absolute: 0 10*3/uL (ref 0.0–0.1)
Basophils Relative: 0 % (ref 0–1)
Eosinophils Absolute: 0.9 10*3/uL — ABNORMAL HIGH (ref 0.0–0.7)
Eosinophils Relative: 11 % — ABNORMAL HIGH (ref 0–5)
HEMATOCRIT: 44.4 % (ref 39.0–52.0)
Hemoglobin: 14.9 g/dL (ref 13.0–17.0)
LYMPHS ABS: 1.1 10*3/uL (ref 0.7–4.0)
LYMPHS PCT: 14 % (ref 12–46)
MCH: 27.1 pg (ref 26.0–34.0)
MCHC: 33.6 g/dL (ref 30.0–36.0)
MCV: 80.9 fL (ref 78.0–100.0)
Monocytes Absolute: 1 10*3/uL (ref 0.1–1.0)
Monocytes Relative: 13 % — ABNORMAL HIGH (ref 3–12)
NEUTROS ABS: 4.8 10*3/uL (ref 1.7–7.7)
Neutrophils Relative %: 62 % (ref 43–77)
PLATELETS: 332 10*3/uL (ref 150–400)
RBC: 5.49 MIL/uL (ref 4.22–5.81)
RDW: 13.1 % (ref 11.5–15.5)
WBC: 7.8 10*3/uL (ref 4.0–10.5)

## 2014-09-06 LAB — URINALYSIS, ROUTINE W REFLEX MICROSCOPIC
GLUCOSE, UA: 100 mg/dL — AB
Hgb urine dipstick: NEGATIVE
KETONES UR: 15 mg/dL — AB
Leukocytes, UA: NEGATIVE
Nitrite: NEGATIVE
Protein, ur: NEGATIVE mg/dL
Specific Gravity, Urine: 1.03 (ref 1.005–1.030)
Urobilinogen, UA: 0.2 mg/dL (ref 0.0–1.0)
pH: 5 (ref 5.0–8.0)

## 2014-09-06 LAB — COMPREHENSIVE METABOLIC PANEL
ALK PHOS: 120 U/L — AB (ref 39–117)
ALT: 26 U/L (ref 0–53)
AST: 18 U/L (ref 0–37)
Albumin: 3.4 g/dL — ABNORMAL LOW (ref 3.5–5.2)
Anion gap: 16 — ABNORMAL HIGH (ref 5–15)
BUN: 22 mg/dL (ref 6–23)
CO2: 21 meq/L (ref 19–32)
Calcium: 8.9 mg/dL (ref 8.4–10.5)
Chloride: 95 mEq/L — ABNORMAL LOW (ref 96–112)
Creatinine, Ser: 1.12 mg/dL (ref 0.50–1.35)
GFR, EST AFRICAN AMERICAN: 84 mL/min — AB (ref >90)
GFR, EST NON AFRICAN AMERICAN: 72 mL/min — AB (ref >90)
GLUCOSE: 174 mg/dL — AB (ref 70–99)
Potassium: 4.8 mEq/L (ref 3.7–5.3)
SODIUM: 132 meq/L — AB (ref 137–147)
Total Bilirubin: 0.4 mg/dL (ref 0.3–1.2)
Total Protein: 7.9 g/dL (ref 6.0–8.3)

## 2014-09-06 LAB — I-STAT CG4 LACTIC ACID, ED: LACTIC ACID, VENOUS: 0.88 mmol/L (ref 0.5–2.2)

## 2014-09-06 LAB — LIPASE, BLOOD: Lipase: 19 U/L (ref 11–59)

## 2014-09-06 MED ORDER — ACETAMINOPHEN 325 MG PO TABS: 650.0000 mg | ORAL_TABLET | Freq: Four times a day (QID) | ORAL | Status: DC | PRN

## 2014-09-06 MED ORDER — HYDROMORPHONE HCL 1 MG/ML IJ SOLN
1.0000 mg | Freq: Once | INTRAMUSCULAR | Status: AC
  Administered 2014-09-06: 1 mg via INTRAVENOUS
  Filled 2014-09-06: qty 1

## 2014-09-06 MED ORDER — METRONIDAZOLE IN NACL 5-0.79 MG/ML-% IV SOLN
500.0000 mg | Freq: Once | INTRAVENOUS | Status: AC
  Administered 2014-09-06: 500 mg via INTRAVENOUS
  Filled 2014-09-06: qty 100

## 2014-09-06 MED ORDER — SODIUM CHLORIDE 0.9 % IV BOLUS (SEPSIS)
1000.0000 mL | Freq: Once | INTRAVENOUS | Status: AC
  Administered 2014-09-06: 1000 mL via INTRAVENOUS

## 2014-09-06 MED ORDER — ONDANSETRON HCL 4 MG PO TABS: 4.0000 mg | ORAL_TABLET | Freq: Four times a day (QID) | ORAL | Status: DC | PRN

## 2014-09-06 MED ORDER — ONDANSETRON HCL 4 MG/2ML IJ SOLN
4.0000 mg | Freq: Once | INTRAMUSCULAR | Status: AC
  Administered 2014-09-06: 4 mg via INTRAVENOUS
  Filled 2014-09-06: qty 2

## 2014-09-06 MED ORDER — MORPHINE SULFATE 2 MG/ML IJ SOLN
1.0000 mg | INTRAMUSCULAR | Status: DC | PRN
  Administered 2014-09-07 (×3): 1 mg via INTRAVENOUS
  Filled 2014-09-06 (×3): qty 1

## 2014-09-06 MED ORDER — METRONIDAZOLE IN NACL 5-0.79 MG/ML-% IV SOLN
500.0000 mg | Freq: Three times a day (TID) | INTRAVENOUS | Status: DC
  Administered 2014-09-07 – 2014-09-09 (×7): 500 mg via INTRAVENOUS
  Filled 2014-09-06 (×9): qty 100

## 2014-09-06 MED ORDER — CIPROFLOXACIN IN D5W 400 MG/200ML IV SOLN
400.0000 mg | Freq: Once | INTRAVENOUS | Status: AC
  Administered 2014-09-06: 400 mg via INTRAVENOUS
  Filled 2014-09-06: qty 200

## 2014-09-06 MED ORDER — ALBUTEROL SULFATE (2.5 MG/3ML) 0.083% IN NEBU: 2.5000 mg | INHALATION_SOLUTION | Freq: Four times a day (QID) | RESPIRATORY_TRACT | Status: DC | PRN

## 2014-09-06 MED ORDER — ONDANSETRON HCL 4 MG/2ML IJ SOLN: 4.0000 mg | Freq: Four times a day (QID) | INTRAMUSCULAR | Status: DC | PRN

## 2014-09-06 MED ORDER — INSULIN ASPART 100 UNIT/ML ~~LOC~~ SOLN
0.0000 [IU] | SUBCUTANEOUS | Status: DC
  Administered 2014-09-07 (×2): 1 [IU] via SUBCUTANEOUS
  Administered 2014-09-07: 2 [IU] via SUBCUTANEOUS

## 2014-09-06 MED ORDER — IOHEXOL 300 MG/ML  SOLN
100.0000 mL | Freq: Once | INTRAMUSCULAR | Status: AC | PRN
  Administered 2014-09-06: 100 mL via INTRAVENOUS

## 2014-09-06 MED ORDER — SODIUM CHLORIDE 0.9 % IV SOLN
INTRAVENOUS | Status: DC
  Administered 2014-09-07 (×2): via INTRAVENOUS
  Administered 2014-09-07: 1000 mL via INTRAVENOUS

## 2014-09-06 MED ORDER — ACETAMINOPHEN 650 MG RE SUPP: 650.0000 mg | Freq: Four times a day (QID) | RECTAL | Status: DC | PRN

## 2014-09-06 NOTE — Progress Notes (Signed)
Received report from ED. Mariaisabel Bodiford Thacker, RN 

## 2014-09-06 NOTE — ED Provider Notes (Signed)
CSN: [id number]     Arrival date & time 09/06/14  1506 History   First MD Initiated Contact with Patient 09/06/14 1821     Chief Complaint  Patient presents with  . Abdominal Pain     (Consider location/radiation/quality/duration/timing/severity/associated sxs/prior Treatment) HPI Richard [name] is a 53 y.o. male who presents to emergency department complaining of abdominal pain for the last 3 days. Patient states his pain is diffuse. Associated nausea vomiting, states unable to have a bowel movement. States feels like his abdomen is distended. History of the same, several months ago, was diagnosed with colitis. Patient states pain is similar but it feels worse today. He denies any blood per rectum. No bloody emesis. States he is having fatigue, decreased appetite, body aches. States pain is burning. No radiation. No medications taken from a pain before coming in  Past Medical History  Diagnosis Date  . Asthma   . Diabetes mellitus without complication    Past Surgical History  Procedure Laterality Date  . Skin graft    . Appendectomy     History reviewed. No pertinent family history. History  Substance Use Topics  . Smoking status: Current Every Day Smoker -- 0.50 packs/day    Types: Cigarettes  . Smokeless tobacco: Not on file  . Alcohol Use: No    Review of Systems  Constitutional: Positive for chills and fatigue. Negative for fever.  Respiratory: Negative for cough, chest tightness and shortness of breath.   Cardiovascular: Negative for chest pain, palpitations and leg swelling.  Gastrointestinal: Positive for nausea, vomiting and abdominal pain. Negative for diarrhea, constipation, blood in stool and abdominal distention.  Genitourinary: Negative for dysuria, urgency, frequency and hematuria.  Musculoskeletal: Positive for myalgias and arthralgias. Negative for neck pain and neck stiffness.  Skin: Negative for rash.  Allergic/Immunologic: Negative for immunocompromised  state.  Neurological: Positive for weakness. Negative for dizziness, light-headedness, numbness and headaches.  All other systems reviewed and are negative.     Allergies  Review of patient's allergies indicates no known allergies.  Home Medications   Prior to Admission medications   Medication Sig Start Date End Date Taking? Authorizing Provider  albuterol (PROVENTIL HFA;VENTOLIN HFA) 108 (90 BASE) MCG/ACT inhaler Inhale 2 puffs into the lungs every 6 (six) hours as needed for wheezing or shortness of breath.   Yes Historical Provider, MD  docusate sodium (COLACE) 100 MG capsule Take 200 mg by mouth daily as needed for mild constipation.   Yes Historical Provider, MD  glipiZIDE (GLUCOTROL) 10 MG tablet Take 10 mg by mouth 2 (two) times daily.   Yes Historical Provider, MD  ibuprofen (ADVIL,MOTRIN) 800 MG tablet Take 800 mg by mouth every 8 (eight) hours as needed for moderate pain. 05/21/14  Yes Resa Miner Lawyer, PA-C  lisinopril (PRINIVIL,ZESTRIL) 2.5 MG tablet Take 2.5 mg by mouth daily.   Yes Historical Provider, MD  metFORMIN (GLUCOPHAGE) 1000 MG tablet Take 1,000 mg by mouth 2 (two) times daily with a meal.   Yes Historical Provider, MD  pregabalin (LYRICA) 75 MG capsule Take 75 mg by mouth 2 (two) times daily.   Yes Historical Provider, MD   BP 101/60 mmHg  Pulse 101  Temp(Src) 97.9 F (36.6 C) (Oral)  Resp 20  Ht 6\' 2"  (1.88 m)  Wt 200 lb (90.719 kg)  BMI 25.67 kg/m2  SpO2 100% Physical Exam  Constitutional: He appears well-developed and well-nourished.  Appears to be uncomfortable  HENT:  Head: Normocephalic and atraumatic.  Eyes: Conjunctivae  are normal.  Neck: Neck supple.  Cardiovascular: Normal rate, regular rhythm and normal heart sounds.   Pulmonary/Chest: Effort normal. No respiratory distress. He has no wheezes. He has no rales.  Abdominal: Soft. Bowel sounds are normal. He exhibits no distension. There is tenderness. There is guarding. There is no rebound.   Diffuse tenderness with guarding.  Musculoskeletal: He exhibits no edema.  Neurological: He is alert.  Skin: Skin is warm and dry.  Nursing note and vitals reviewed.   ED Course  Procedures (including critical care time) Labs Review Labs Reviewed  CBC WITH DIFFERENTIAL - Abnormal; Notable for the following:    Monocytes Relative 13 (*)    Eosinophils Relative 11 (*)    Eosinophils Absolute 0.9 (*)    All other components within normal limits  COMPREHENSIVE METABOLIC PANEL - Abnormal; Notable for the following:    Sodium 132 (*)    Chloride 95 (*)    Glucose, Bld 174 (*)    Albumin 3.4 (*)    Alkaline Phosphatase 120 (*)    GFR calc non Af Amer 72 (*)    GFR calc Af Amer 84 (*)    Anion gap 16 (*)    All other components within normal limits  URINALYSIS, ROUTINE W REFLEX MICROSCOPIC - Abnormal; Notable for the following:    Color, Urine ORANGE (*)    APPearance CLOUDY (*)    Glucose, UA 100 (*)    Bilirubin Urine SMALL (*)    Ketones, ur 15 (*)    All other components within normal limits  LIPASE, BLOOD    Imaging Review Ct Abdomen Pelvis W Contrast  09/06/2014   CLINICAL DATA:  Abdominal pain, lack of appetite, dizziness and nausea.  EXAM: CT ABDOMEN AND PELVIS WITH CONTRAST  TECHNIQUE: Multidetector CT imaging of the abdomen and pelvis was performed using the standard protocol following bolus administration of intravenous contrast.  CONTRAST:  165mL OMNIPAQUE IOHEXOL 300 MG/ML  SOLN  COMPARISON:  05/21/2014  FINDINGS: There are multiple left-sided jejunal small bowel loops demonstrating diffuse thickening and edema with wall thickness approaching 13 mm. These loops are also dilated with maximum diameter of approximately 3.5 cm. Oral contrast does transit through this region into more distal small bowel. At the level of thickened and dilated jejunum, there is prominent surrounding edema as well as some free fluid interspersed in bowel loops. There is some fecalization bowel  contents and thickened jejunum. No overt pneumatosis is identified.  Free fluid is also present around the liver and moderate free fluid is present in the pelvis. Small bowel findings are consistent with partial obstruction and significant inflammation/enteritis. Close clinical correlation is suggested as these loops are potentially necrotic or ischemic based on the significant inflammation and free fluid present. There is no evidence of free intraperitoneal air or focal abscess. No obvious internal hernia or closed loop obstruction by CT.  The rest of the bowel shows no significant abnormalities. The stomach is not distended. The liver, gallbladder, pancreas, spleen, adrenal glands and kidneys are unremarkable. Scattered small mesenteric lymph nodes are identified. No evidence of hernia. The bladder is decompressed. No abnormal calcifications identified. Degenerative changes are present at the L5-S1 level.  IMPRESSION: Multiple thickened, dilated and edematous left-sided jejunal small bowel loops. Oral contrast does transit through this level. Findings are consistent with significant inflammation/ enteritis as well as a probable component of partial obstruction. The presence of significant edema as well as moderate free fluid may imply potential ischemia/necrosis of  bowel.   Electronically Signed   By: Aletta Edouard M.D.   On: 09/06/2014 21:53     EKG Interpretation None      MDM   Final diagnoses:  Abdominal pain  Enteritis  SBO (small bowel obstruction)   Patient with diffuse abdominal pain, guarding on exam, consistent with possible acute abdomen. Slightly tachycardic otherwise normal vital signs. Labs are normal to this point. Given acute abdomen findings will get a CT scan. Premedications ordered.   Filed Vitals:   09/06/14 1519 09/06/14 1848 09/06/14 1900 09/06/14 1912  BP: 101/73 117/77 101/60 101/60  Pulse: 119  106 101  Temp: 97.9 F (36.6 C)     TempSrc: Oral     Resp: 20      Height: 6\' 2"  (1.88 m)     Weight: 200 lb (90.719 kg)     SpO2: 100%  97% 100%   11:01 PM CT showing enteritis with possible sbo, and possible ischemic bowel. Discussed with Dr. Hulen Skains with general surgery. Will consult. Asked for medicine to admit.  Pt states a this time pain is much better with IV dilaudid. Down to 48J systolic. Will give another IV fluid bolus  11:22 PM Spoke with triad will admit.   Renold Genta, PA-C 09/07/14 0007  Shaune Pollack, MD 09/09/14 [phone]

## 2014-09-06 NOTE — ED Notes (Signed)
Flagyl given before transport to the floor.

## 2014-09-06 NOTE — ED Notes (Signed)
PA AT BEDSIDE  

## 2014-09-06 NOTE — ED Notes (Signed)
Ct notified pt has completed oral contrast.

## 2014-09-06 NOTE — H&P (Signed)
[facility] History and Physical  Silver Parkey NVB:[id number] DOB: October 06, 1961 DOA: 09/06/2014  Referring physician: ER physician. PCP: Pcp Not In System Patient recently moved from [place].  Chief Complaint: Abdominal pain.  HPI: Richard [name] is a 53 y.o. male with history of diabetes mellitus and asthma presents to the ER because of abdominal pain. Patient has been abdominal pain over the last 3 days. Pain is mostly in the epigastric area. Pain is colicky in nature with nausea and no vomiting or diarrhea. Patient has not moved his bowels since Saturday 2 days ago. Patient had a bowel movement after he used laxative on Saturday. In the ER CT abdomen and pelvis done shows possible inflammation around the jejunal area. On call surgeon Dr. Dema Severin was consulted by the ER physician. Patient had a similar episode 2 months ago and at that time was treated with antibiotics. Patient stays with treatment of antibiotics his symptoms had resolved at that time.   Review of Systems: As presented in the history of presenting illness, rest negative.  Past Medical History  Diagnosis Date  . Asthma   . Diabetes mellitus without complication    Past Surgical History  Procedure Laterality Date  . Skin graft    . Appendectomy     Social History:  reports that he has been smoking Cigarettes.  He has been smoking about 0.50 packs per day. He does not have any smokeless tobacco history on file. He reports that he does not drink alcohol or use illicit drugs. Where does patient live at home. Can patient participate in ADLs? Yes.  No Known Allergies  Family History: History reviewed. No pertinent family history.    Prior to Admission medications   Medication Sig Start Date End Date Taking? Authorizing Provider  albuterol (PROVENTIL HFA;VENTOLIN HFA) 108 (90 BASE) MCG/ACT inhaler Inhale 2 puffs into the lungs every 6 (six) hours as needed for wheezing or shortness of breath.   Yes Historical  Provider, MD  docusate sodium (COLACE) 100 MG capsule Take 200 mg by mouth daily as needed for mild constipation.   Yes Historical Provider, MD  glipiZIDE (GLUCOTROL) 10 MG tablet Take 10 mg by mouth 2 (two) times daily.   Yes Historical Provider, MD  ibuprofen (ADVIL,MOTRIN) 800 MG tablet Take 800 mg by mouth every 8 (eight) hours as needed for moderate pain. 05/21/14  Yes Resa Miner Lawyer, PA-C  lisinopril (PRINIVIL,ZESTRIL) 2.5 MG tablet Take 2.5 mg by mouth daily.   Yes Historical Provider, MD  metFORMIN (GLUCOPHAGE) 1000 MG tablet Take 1,000 mg by mouth 2 (two) times daily with a meal.   Yes Historical Provider, MD  pregabalin (LYRICA) 75 MG capsule Take 75 mg by mouth 2 (two) times daily.   Yes Historical Provider, MD    Physical Exam: Filed Vitals:   09/06/14 2200 09/06/14 2230 09/06/14 2300 09/06/14 2330  BP: 102/58 94/62 109/74 128/73  Pulse: 99 101 98 96  Temp:      TempSrc:      Resp:      Height:      Weight:      SpO2: 98% 98% 99% 100%     General:  Well-developed and nourished.  Eyes: Anicteric.  ENT: No discharge from the ears eyes nose mouth.  Neck: No mass felt.  Cardiovascular: S1-S2 heard.  Respiratory: No rhonchi or crepitations.  Abdomen: Soft mild epigastric tenderness no guarding or rigidity. Bowel sounds appreciated.  Skin: No rash.  Musculoskeletal: No edema.  Psychiatric: Appears  normal.  Neurologic: Alert awake oriented to time place and person. Moves all extremities.  Labs on Admission:  Basic Metabolic Panel:  Recent Labs Lab 09/06/14 1541  NA 132*  K 4.8  CL 95*  CO2 21  GLUCOSE 174*  BUN 22  CREATININE 1.12  CALCIUM 8.9   Liver Function Tests:  Recent Labs Lab 09/06/14 1541  AST 18  ALT 26  ALKPHOS 120*  BILITOT 0.4  PROT 7.9  ALBUMIN 3.4*    Recent Labs Lab 09/06/14 1541  LIPASE 19   No results for input(s): AMMONIA in the last 168 hours. CBC:  Recent Labs Lab 09/06/14 1541  WBC 7.8  NEUTROABS 4.8   HGB 14.9  HCT 44.4  MCV 80.9  PLT 332   Cardiac Enzymes: No results for input(s): CKTOTAL, CKMB, CKMBINDEX, TROPONINI in the last 168 hours.  BNP (last 3 results) No results for input(s): PROBNP in the last 8760 hours. CBG: No results for input(s): GLUCAP in the last 168 hours.  Radiological Exams on Admission: Ct Abdomen Pelvis W Contrast  09/06/2014   CLINICAL DATA:  Abdominal pain, lack of appetite, dizziness and nausea.  EXAM: CT ABDOMEN AND PELVIS WITH CONTRAST  TECHNIQUE: Multidetector CT imaging of the abdomen and pelvis was performed using the standard protocol following bolus administration of intravenous contrast.  CONTRAST:  143mL OMNIPAQUE IOHEXOL 300 MG/ML  SOLN  COMPARISON:  05/21/2014  FINDINGS: There are multiple left-sided jejunal small bowel loops demonstrating diffuse thickening and edema with wall thickness approaching 13 mm. These loops are also dilated with maximum diameter of approximately 3.5 cm. Oral contrast does transit through this region into more distal small bowel. At the level of thickened and dilated jejunum, there is prominent surrounding edema as well as some free fluid interspersed in bowel loops. There is some fecalization bowel contents and thickened jejunum. No overt pneumatosis is identified.  Free fluid is also present around the liver and moderate free fluid is present in the pelvis. Small bowel findings are consistent with partial obstruction and significant inflammation/enteritis. Close clinical correlation is suggested as these loops are potentially necrotic or ischemic based on the significant inflammation and free fluid present. There is no evidence of free intraperitoneal air or focal abscess. No obvious internal hernia or closed loop obstruction by CT.  The rest of the bowel shows no significant abnormalities. The stomach is not distended. The liver, gallbladder, pancreas, spleen, adrenal glands and kidneys are unremarkable. Scattered small  mesenteric lymph nodes are identified. No evidence of hernia. The bladder is decompressed. No abnormal calcifications identified. Degenerative changes are present at the L5-S1 level.  IMPRESSION: Multiple thickened, dilated and edematous left-sided jejunal small bowel loops. Oral contrast does transit through this level. Findings are consistent with significant inflammation/ enteritis as well as a probable component of partial obstruction. The presence of significant edema as well as moderate free fluid may imply potential ischemia/necrosis of bowel.   Electronically Signed   By: Aletta Edouard M.D.   On: 09/06/2014 21:53     Assessment/Plan Active Problems:   Abdominal pain   Diabetes mellitus type 2, controlled   Asthma   1. Abdominal pain with jejunal enteritis/inflammation with possible partial obstruction - I have discussed with the provider on-call surgeon who at this time is recommended to get gastroenterologist consult. Patient will be kept nothing by mouth until then and on Cipro and Flagyl. Continue with gentle hydration and pain relief medications. Patient's lactic acid levels were normal. 2. Diabetes  mellitus type 2 - since patient is nothing by mouth patient's antidiabetic medications will be on hold and patient has been placed on CABG with sliding-scale coverage. 3. Asthma presently not wheezing continue when necessary inhalers.    Code Status: Full code.  Family Communication: None.  Disposition Plan: Admit to inpatient.    Darlette Dubow N. [facility] Pager [phone].  If 7PM-7AM, please contact night-coverage [email or web address] Password [email or web address] 09/06/2014, 11:50 PM

## 2014-09-06 NOTE — ED Notes (Signed)
Pt given oral contrast, instructed to notify staff when complete.

## 2014-09-06 NOTE — ED Notes (Addendum)
Pt in c/o abd pain since Friday, history of similar symptoms in the past, pt denies n/v/d but reports burning sensation in stomach, chills, body aches, fatigue and decreased appetite. No distress noted. Last CBG check today was 200, this is in patients normal levels.

## 2014-09-06 NOTE — ED Notes (Signed)
Pt Cipro IV running, Flagyl still pending to be given [place] on 5W notified.

## 2014-09-06 NOTE — ED Notes (Signed)
PATIENT STATES THAT HE HAS HAD INCREASING ABDOMINAL PAIN SINCE Friday,. STATES HE HAS NOT FELT LIKE EATING OR DRINKING ANYTHING SINCE ONSET. STATES TODAY HE HAS FELT DIZZY WHEN HE STANDS UP. STATES HE HAS HAD SOME NAUSEA. HE DENIES ANY FEVER. STATES THE PAIN IS CONSTANT IN THE MIDDLE OF HIS ABDOMEN BUT GETS WORSE IN WAVES THAT LAST 2-3 MINUTES.

## 2014-09-07 DIAGNOSIS — K529 Noninfective gastroenteritis and colitis, unspecified: Secondary | ICD-10-CM

## 2014-09-07 DIAGNOSIS — I10 Essential (primary) hypertension: Secondary | ICD-10-CM

## 2014-09-07 DIAGNOSIS — R1013 Epigastric pain: Secondary | ICD-10-CM

## 2014-09-07 LAB — CBC WITH DIFFERENTIAL/PLATELET
BASOS PCT: 0 % (ref 0–1)
Basophils Absolute: 0 10*3/uL (ref 0.0–0.1)
Eosinophils Absolute: 0.7 10*3/uL (ref 0.0–0.7)
Eosinophils Relative: 12 % — ABNORMAL HIGH (ref 0–5)
HEMATOCRIT: 37.9 % — AB (ref 39.0–52.0)
Hemoglobin: 12.4 g/dL — ABNORMAL LOW (ref 13.0–17.0)
Lymphocytes Relative: 20 % (ref 12–46)
Lymphs Abs: 1.1 10*3/uL (ref 0.7–4.0)
MCH: 27.3 pg (ref 26.0–34.0)
MCHC: 32.7 g/dL (ref 30.0–36.0)
MCV: 83.5 fL (ref 78.0–100.0)
MONO ABS: 0.7 10*3/uL (ref 0.1–1.0)
Monocytes Relative: 13 % — ABNORMAL HIGH (ref 3–12)
Neutro Abs: 3.2 10*3/uL (ref 1.7–7.7)
Neutrophils Relative %: 55 % (ref 43–77)
Platelets: 262 10*3/uL (ref 150–400)
RBC: 4.54 MIL/uL (ref 4.22–5.81)
RDW: 13.3 % (ref 11.5–15.5)
WBC: 5.7 10*3/uL (ref 4.0–10.5)

## 2014-09-07 LAB — GLUCOSE, CAPILLARY
GLUCOSE-CAPILLARY: 125 mg/dL — AB (ref 70–99)
GLUCOSE-CAPILLARY: 167 mg/dL — AB (ref 70–99)
Glucose-Capillary: 148 mg/dL — ABNORMAL HIGH (ref 70–99)
Glucose-Capillary: 115 mg/dL — ABNORMAL HIGH (ref 70–99)
Glucose-Capillary: 106 mg/dL — ABNORMAL HIGH (ref 70–99)
Glucose-Capillary: 189 mg/dL — ABNORMAL HIGH (ref 70–99)

## 2014-09-07 LAB — COMPREHENSIVE METABOLIC PANEL
ALK PHOS: 96 U/L (ref 39–117)
ALT: 20 U/L (ref 0–53)
ANION GAP: 13 (ref 5–15)
AST: 16 U/L (ref 0–37)
Albumin: 2.9 g/dL — ABNORMAL LOW (ref 3.5–5.2)
BILIRUBIN TOTAL: 0.4 mg/dL (ref 0.3–1.2)
BUN: 25 mg/dL — AB (ref 6–23)
CHLORIDE: 99 meq/L (ref 96–112)
CO2: 22 mEq/L (ref 19–32)
Calcium: 8 mg/dL — ABNORMAL LOW (ref 8.4–10.5)
Creatinine, Ser: 1.21 mg/dL (ref 0.50–1.35)
GFR calc Af Amer: 76 mL/min — ABNORMAL LOW (ref >90)
GFR calc non Af Amer: 66 mL/min — ABNORMAL LOW (ref >90)
Glucose, Bld: 117 mg/dL — ABNORMAL HIGH (ref 70–99)
Potassium: 4.5 mEq/L (ref 3.7–5.3)
Sodium: 134 mEq/L — ABNORMAL LOW (ref 137–147)
Total Protein: 6.4 g/dL (ref 6.0–8.3)

## 2014-09-07 LAB — C-REACTIVE PROTEIN: CRP: 2.8 mg/dL — AB (ref <0.60)

## 2014-09-07 LAB — LACTIC ACID, PLASMA: Lactic Acid, Venous: 1 mmol/L (ref 0.5–2.2)

## 2014-09-07 LAB — SEDIMENTATION RATE: SED RATE: 9 mm/h (ref 0–16)

## 2014-09-07 MED ORDER — INSULIN ASPART 100 UNIT/ML ~~LOC~~ SOLN
0.0000 [IU] | Freq: Three times a day (TID) | SUBCUTANEOUS | Status: DC
  Administered 2014-09-08: 2 [IU] via SUBCUTANEOUS
  Administered 2014-09-08 – 2014-09-09 (×2): 3 [IU] via SUBCUTANEOUS

## 2014-09-07 MED ORDER — SODIUM CHLORIDE 0.9 % IV SOLN
INTRAVENOUS | Status: DC
  Administered 2014-09-07: 1000 mL via INTRAVENOUS
  Administered 2014-09-08 – 2014-09-09 (×2): via INTRAVENOUS

## 2014-09-07 MED ORDER — CIPROFLOXACIN IN D5W 400 MG/200ML IV SOLN
400.0000 mg | Freq: Two times a day (BID) | INTRAVENOUS | Status: DC
  Administered 2014-09-07 – 2014-09-08 (×4): 400 mg via INTRAVENOUS
  Filled 2014-09-07 (×6): qty 200

## 2014-09-07 MED ORDER — CETYLPYRIDINIUM CHLORIDE 0.05 % MT LIQD
7.0000 mL | Freq: Two times a day (BID) | OROMUCOSAL | Status: DC
  Administered 2014-09-07 – 2014-09-08 (×3): 7 mL via OROMUCOSAL

## 2014-09-07 NOTE — Plan of Care (Signed)
Problem: Phase I Progression Outcomes Goal: Pain controlled with appropriate interventions Outcome: Progressing Goal: Initial discharge plan identified Outcome: Completed/Met Date Met:  09/07/14  Problem: Phase II Progression Outcomes Goal: Vital signs remain stable Outcome: Progressing  Problem: Phase III Progression Outcomes Goal: Pain controlled on oral analgesia Outcome: Progressing

## 2014-09-07 NOTE — Consult Note (Signed)
Subjective:   HPI  The patient is a 53 year old male who presented to the hospital emergency room with complaints of abdominal pain each had been going on for about 3 days. He states the pain was quite severe and intense and it was all around his abdomen but worse on the right side. He had a CT scan done of the abdomen and pelvis which showed evidence of inflammatory findings in the jejunum with multiple thickened dilated and edematous left-sided jejunal small bowel loops it was felt that this was consistent with significant inflammatory enteritis as well as probable component of partial obstruction. It was felt by the radiologist that close clinical correlation needed to be done because of the potential of necrosis of these areas. The patient was in the emergency room in August of this year with similar complaints of abdominal pain according to him. In reviewing the records he had a CT of the abdomen and pelvis in August which showed inflammatory findings in the sigmoid and descending colon. Air was no mention of small bowel inflammation. He was sent home at that time on Cipro and Flagyl thinking that this problem was an infectious enteritis. He is currently again on antibiotic therapy.  Review of Systems No chest pain or shortness of breath denies definite fever  Past Medical History  Diagnosis Date  . Asthma   . Diabetes mellitus without complication    Past Surgical History  Procedure Laterality Date  . Skin graft    . Appendectomy     History   Social History  . Marital Status: Single    Spouse Name: N/A    Number of Children: N/A  . Years of Education: N/A   Occupational History  . Not on file.   Social History Main Topics  . Smoking status: Current Every Day Smoker -- 0.50 packs/day    Types: Cigarettes  . Smokeless tobacco: Not on file  . Alcohol Use: No  . Drug Use: No  . Sexual Activity: Not on file   Other Topics Concern  . Not on file   Social History Narrative    family history is not on file. Current facility-administered medications: 0.9 %  sodium chloride infusion, , Intravenous, Continuous, Rise Patience, MD, Last Rate: 125 mL/hr at 09/07/14 1107, 1,000 mL at 09/07/14 1107;  acetaminophen (TYLENOL) tablet 650 mg, 650 mg, Oral, Q6H PRN **OR** acetaminophen (TYLENOL) suppository 650 mg, 650 mg, Rectal, Q6H PRN, Rise Patience, MD albuterol (PROVENTIL) (2.5 MG/3ML) 0.083% nebulizer solution 2.5 mg, 2.5 mg, Inhalation, Q6H PRN, Rise Patience, MD;  antiseptic oral rinse (CPC / CETYLPYRIDINIUM CHLORIDE 0.05%) solution 7 mL, 7 mL, Mouth Rinse, BID, Rise Patience, MD, 7 mL at 09/07/14 0904;  ciprofloxacin (CIPRO) IVPB 400 mg, 400 mg, Intravenous, Q12H, Rise Patience, MD, 400 mg at 09/07/14 0901 insulin aspart (novoLOG) injection 0-9 Units, 0-9 Units, Subcutaneous, Q4H, Rise Patience, MD, 1 Units at 09/07/14 0857;  metroNIDAZOLE (FLAGYL) IVPB 500 mg, 500 mg, Intravenous, Q8H, Rise Patience, MD, 500 mg at 09/07/14 1323;  morphine 2 MG/ML injection 1 mg, 1 mg, Intravenous, Q3H PRN, Rise Patience, MD, 1 mg at 09/07/14 1005 ondansetron (ZOFRAN) tablet 4 mg, 4 mg, Oral, Q6H PRN **OR** ondansetron (ZOFRAN) injection 4 mg, 4 mg, Intravenous, Q6H PRN, Rise Patience, MD No Known Allergies   Objective:     BP 103/63 mmHg  Pulse 91  Temp(Src) 98.8 F (37.1 C) (Oral)  Resp 15  Ht 6\' 2"  (  1.88 m)  Wt 94.5 kg (208 lb 5.4 oz)  BMI 26.74 kg/m2  SpO2 98%  He is alert and oriented and in no acute distress  Heart regular no murmurs  Lungs clear  Abdomen: Bowel sounds are normal, soft, there is tenderness in the midabdomen and along the right side of the abdomen without rebound tenderness  Laboratory No components found for: D1    Assessment:     Enteritis. On the CT it is affecting the jejunum. Etiology of this at this time is unclear. It could be infectious, ischemic, although on prior CT scan abdominal  vessels were felt to be patent. Another consideration could be Crohn's disease. Another consideration could be eosinophilic enteritis. It is noted that his eosinophils are elevated and were elevated during his emergency room visit in August.      Plan:     Continue supportive care. Continue antibiotic therapy. Would obtain GI pathogens panel. If he does not respond in the next day or 2 to antibiotic therapy from a clinical standpoint I will consider adding steroids. If he has Crohn's steroids would help. If he has eosinophilic enteritis steroids will help. He asked about colonoscopy. He has never had a colonoscopy. I think that given the potential of small bowel obstruction as described on the CT scan it would not be prudent to prep him for colonoscopy right now but wait a few days and see how he does clinically. Lab Results  Component Value Date   HGB 12.4* 09/07/2014   HGB 14.9 09/06/2014   HGB 12.8* 06/04/2014   HCT 37.9* 09/07/2014   HCT 44.4 09/06/2014   HCT 39.0 06/04/2014   ALKPHOS 96 09/07/2014   ALKPHOS 120* 09/06/2014   ALKPHOS 135* 06/04/2014   AST 16 09/07/2014   AST 18 09/06/2014   AST 32 06/04/2014   ALT 20 09/07/2014   ALT 26 09/06/2014   ALT 44 06/04/2014

## 2014-09-07 NOTE — Progress Notes (Signed)
[facility] PROGRESS NOTE  Richard [name] [id number] DOB: [birth year] DOA: 09/06/2014 PCP: Pcp Not In System  Interim summary Patient is a 53 year old gentleman having a past medical history of diabetes mellitus who was admitted to the medicine service on 09/06/2014. He presented with complaints of abdominal pain located in the epigastric and right upper quadrant region characterized as sharp and stabbing that started 3 days ago becoming progressively worse over time. He denies associated diarrhea, bloody stools, melena, hematemesis, nausea or vomiting. He states having a similar episode about 3 months ago at which time he was treated with antibiotics which resolved. Initial workup included a CT scan of abdomen and pelvis which showed multiple thickened dilated and edematous left-sided jejunal small bowel loops, findings consistent with significant inflammation/enteritis.  GI was consulted.                                                                                                                                                                                                                             Assessment/Plan: 1. Enteritis. -Patient presenting with a 3 day history of abdominal pain, with initial CT scan of abdomen and pelvis showing significant inflammation/enteritis involving jejunal small bowel loops. -Denied associated nausea vomiting, diarrhea, bloody stools. -GI was consulted as patient was evaluated by Dr Penelope Coop, who recommended continuing supportive care, empiric IV antibiotic therapy with ciprofloxacin and Flagyl, obtaining GI pathogen panel, consider adding systemic steroids if there is no significant improvement. -Lab showed sedimentation rate of 9, CRP of 2.8, white count of 5700 -GI did not recommend colonoscopy at this point in time -Diet advanced to clears.  2.   Diabetes mellitus -Patient was initially made nothing by mouth for which metformin and glipizide were  discontinued on admission -Blood sugars remain stable, now on clear liquid diet -Provide sliding scale coverage  3.  Hypertension  -Blood pressures are soft, having last blood pressure 103/63, holding antihypertensive agents for now  Code Status: Full code Family Communication:  Disposition Plan:    Consultants:  Gastroenterology   Antibiotics:  Ciprofloxacin 400 mg IV every 12 hours (started on 09/06/2014)  Flagyl 500 mg IV every 8 hours (started on 09/06/2014)  HPI/Subjective: Patient complaining of ongoing abdominal pain located in the epigastric and left upper quadrant, denies diarrhea, nausea, vomiting, GI bleed.   Objective: Filed Vitals:   09/07/14 1359  BP: 103/63  Pulse:   Temp: 98.8 F (37.1 C)  Resp: 15  Intake/Output Summary (Last 24 hours) at 09/07/14 1620 Last data filed at 09/07/14 1039  Gross per 24 hour  Intake  812.5 ml  Output      0 ml  Net  812.5 ml   Filed Weights   09/06/14 1519 09/07/14 0013  Weight: 90.719 kg (200 lb) 94.5 kg (208 lb 5.4 oz)    Exam:   General:  Appears uncomfortable although no acute distress awake alert oriented  Cardiovascular: Regular rate and rhythm normal S1-S2 no murmurs rubs or gallops  Respiratory: Clear to auscultation bilaterally no wheezing rhonchi or rales  Abdomen: Patient having pain to palpation across epigastric region, had positive bowel sounds to all quadrants, no rebound tenderness or peritoneal signs  Musculoskeletal: No edema  Data Reviewed: Basic Metabolic Panel:  Recent Labs Lab 09/06/14 1541 09/07/14 0351  NA 132* 134*  K 4.8 4.5  CL 95* 99  CO2 21 22  GLUCOSE 174* 117*  BUN 22 25*  CREATININE 1.12 1.21  CALCIUM 8.9 8.0*   Liver Function Tests:  Recent Labs Lab 09/06/14 1541 09/07/14 0351  AST 18 16  ALT 26 20  ALKPHOS 120* 96  BILITOT 0.4 0.4  PROT 7.9 6.4  ALBUMIN 3.4* 2.9*    Recent Labs Lab 09/06/14 1541  LIPASE 19   No results for input(s): AMMONIA  in the last 168 hours. CBC:  Recent Labs Lab 09/06/14 1541 09/07/14 0351  WBC 7.8 5.7  NEUTROABS 4.8 3.2  HGB 14.9 12.4*  HCT 44.4 37.9*  MCV 80.9 83.5  PLT 332 262   Cardiac Enzymes: No results for input(s): CKTOTAL, CKMB, CKMBINDEX, TROPONINI in the last 168 hours. BNP (last 3 results) No results for input(s): PROBNP in the last 8760 hours. CBG:  Recent Labs Lab 09/07/14 0018 09/07/14 0438 09/07/14 0811 09/07/14 1232  GLUCAP 148* 115* 125* 106*    No results found for this or any previous visit (from the past 240 hour(s)).   Studies: Ct Abdomen Pelvis W Contrast  09/06/2014   CLINICAL DATA:  Abdominal pain, lack of appetite, dizziness and nausea.  EXAM: CT ABDOMEN AND PELVIS WITH CONTRAST  TECHNIQUE: Multidetector CT imaging of the abdomen and pelvis was performed using the standard protocol following bolus administration of intravenous contrast.  CONTRAST:  142mL OMNIPAQUE IOHEXOL 300 MG/ML  SOLN  COMPARISON:  05/21/2014  FINDINGS: There are multiple left-sided jejunal small bowel loops demonstrating diffuse thickening and edema with wall thickness approaching 13 mm. These loops are also dilated with maximum diameter of approximately 3.5 cm. Oral contrast does transit through this region into more distal small bowel. At the level of thickened and dilated jejunum, there is prominent surrounding edema as well as some free fluid interspersed in bowel loops. There is some fecalization bowel contents and thickened jejunum. No overt pneumatosis is identified.  Free fluid is also present around the liver and moderate free fluid is present in the pelvis. Small bowel findings are consistent with partial obstruction and significant inflammation/enteritis. Close clinical correlation is suggested as these loops are potentially necrotic or ischemic based on the significant inflammation and free fluid present. There is no evidence of free intraperitoneal air or focal abscess. No obvious  internal hernia or closed loop obstruction by CT.  The rest of the bowel shows no significant abnormalities. The stomach is not distended. The liver, gallbladder, pancreas, spleen, adrenal glands and kidneys are unremarkable. Scattered small mesenteric lymph nodes are identified. No evidence of hernia. The bladder is decompressed. No abnormal  calcifications identified. Degenerative changes are present at the L5-S1 level.  IMPRESSION: Multiple thickened, dilated and edematous left-sided jejunal small bowel loops. Oral contrast does transit through this level. Findings are consistent with significant inflammation/ enteritis as well as a probable component of partial obstruction. The presence of significant edema as well as moderate free fluid may imply potential ischemia/necrosis of bowel.   Electronically Signed   By: Aletta Edouard M.D.   On: 09/06/2014 21:53    Scheduled Meds: . antiseptic oral rinse  7 mL Mouth Rinse BID  . ciprofloxacin  400 mg Intravenous Q12H  . insulin aspart  0-9 Units Subcutaneous Q4H  . metronidazole  500 mg Intravenous Q8H   Continuous Infusions: . sodium chloride 1,000 mL (09/07/14 1107)    Active Problems:   Abdominal pain   Diabetes mellitus type 2, controlled   Asthma    Time spent: 35 minutes    Kelvin Cellar  [facility] Pager [phone]. If 7PM-7AM, please contact night-coverage at [email or web address], password [facility] 09/07/2014, 4:20 PM  LOS: 1 day

## 2014-09-07 NOTE — Plan of Care (Signed)
Problem: Consults Goal: General Medical Patient Education See Patient Education Module for specific education.  Outcome: Completed/Met Date Met:  09/07/14 Goal: Skin Care Protocol Initiated - if Braden Score 18 or less If consults are not indicated, leave blank or document N/A  Outcome: Not Applicable Date Met:  91/44/45  Problem: Phase I Progression Outcomes Goal: Pain controlled with appropriate interventions Outcome: Progressing Goal: OOB as tolerated unless otherwise ordered Outcome: Completed/Met Date Met:  09/07/14 Goal: Voiding-avoid urinary catheter unless indicated Outcome: Not Applicable Date Met:  84/83/50  Problem: Phase II Progression Outcomes Goal: Progress activity as tolerated unless otherwise ordered Outcome: Completed/Met Date Met:  09/07/14 Goal: Obtain order to discontinue catheter if appropriate Outcome: Not Applicable Date Met:  75/73/22  Problem: Phase III Progression Outcomes Goal: Activity at appropriate level-compared to baseline (UP IN CHAIR FOR HEMODIALYSIS)  Outcome: Completed/Met Date Met:  09/07/14 Goal: Voiding independently Outcome: Completed/Met Date Met:  09/07/14 Goal: Foley discontinued Outcome: Not Applicable Date Met:  56/72/09  Problem: Discharge Progression Outcomes Goal: Pain controlled with appropriate interventions Outcome: Progressing

## 2014-09-07 NOTE — Progress Notes (Signed)
Pt arrive to unit in no s/s of distress. Pt A&Ox4. Pt oriented to unit and room. Charge nurse receive report on pt prior to pt's arrival. Whiteboard updated. Callbell within reach. Will continue to monitor. VS stable.

## 2014-09-07 NOTE — Progress Notes (Signed)
Utilization review completed.  

## 2014-09-08 DIAGNOSIS — J45909 Unspecified asthma, uncomplicated: Secondary | ICD-10-CM

## 2014-09-08 DIAGNOSIS — R1011 Right upper quadrant pain: Secondary | ICD-10-CM

## 2014-09-08 LAB — COMPREHENSIVE METABOLIC PANEL
ALK PHOS: 108 U/L (ref 39–117)
ALT: 27 U/L (ref 0–53)
ANION GAP: 11 (ref 5–15)
AST: 25 U/L (ref 0–37)
Albumin: 3.1 g/dL — ABNORMAL LOW (ref 3.5–5.2)
BILIRUBIN TOTAL: 0.4 mg/dL (ref 0.3–1.2)
BUN: 11 mg/dL (ref 6–23)
CHLORIDE: 102 meq/L (ref 96–112)
CO2: 24 meq/L (ref 19–32)
Calcium: 8.6 mg/dL (ref 8.4–10.5)
Creatinine, Ser: 0.73 mg/dL (ref 0.50–1.35)
GLUCOSE: 164 mg/dL — AB (ref 70–99)
POTASSIUM: 4.5 meq/L (ref 3.7–5.3)
Sodium: 137 mEq/L (ref 137–147)
Total Protein: 6.9 g/dL (ref 6.0–8.3)

## 2014-09-08 LAB — GI PATHOGEN PANEL BY PCR, STOOL
C difficile toxin A/B: POSITIVE
Campylobacter by PCR: NEGATIVE
Cryptosporidium by PCR: NEGATIVE
E coli (ETEC) LT/ST: NEGATIVE
E coli (STEC): NEGATIVE
E coli 0157 by PCR: NEGATIVE
G LAMBLIA BY PCR: NEGATIVE
NOROVIRUS G1/G2: NEGATIVE
Rotavirus A by PCR: NEGATIVE
SHIGELLA BY PCR: NEGATIVE
Salmonella by PCR: NEGATIVE

## 2014-09-08 LAB — CBC
HCT: 35.3 % — ABNORMAL LOW (ref 39.0–52.0)
Hemoglobin: 11.5 g/dL — ABNORMAL LOW (ref 13.0–17.0)
MCH: 26.7 pg (ref 26.0–34.0)
MCHC: 32.6 g/dL (ref 30.0–36.0)
MCV: 82.1 fL (ref 78.0–100.0)
PLATELETS: 243 10*3/uL (ref 150–400)
RBC: 4.3 MIL/uL (ref 4.22–5.81)
RDW: 13.3 % (ref 11.5–15.5)
WBC: 4.3 10*3/uL (ref 4.0–10.5)

## 2014-09-08 LAB — GLUCOSE, CAPILLARY
GLUCOSE-CAPILLARY: 115 mg/dL — AB (ref 70–99)
Glucose-Capillary: 170 mg/dL — ABNORMAL HIGH (ref 70–99)
Glucose-Capillary: 209 mg/dL — ABNORMAL HIGH (ref 70–99)
Glucose-Capillary: 106 mg/dL — ABNORMAL HIGH (ref 70–99)

## 2014-09-08 LAB — HEMOGLOBIN A1C
Hgb A1c MFr Bld: 6.8 % — ABNORMAL HIGH (ref <5.7)
Mean Plasma Glucose: 148 mg/dL — ABNORMAL HIGH (ref <117)

## 2014-09-08 MED ORDER — PREGABALIN 75 MG PO CAPS
75.0000 mg | ORAL_CAPSULE | Freq: Two times a day (BID) | ORAL | Status: DC
  Administered 2014-09-08 – 2014-09-09 (×3): 75 mg via ORAL
  Filled 2014-09-08 (×3): qty 1

## 2014-09-08 MED ORDER — DIPHENHYDRAMINE HCL 50 MG/ML IJ SOLN
25.0000 mg | Freq: Once | INTRAMUSCULAR | Status: AC
  Administered 2014-09-08: 25 mg via INTRAVENOUS
  Filled 2014-09-08: qty 1

## 2014-09-08 MED ORDER — ENOXAPARIN SODIUM 40 MG/0.4ML ~~LOC~~ SOLN
40.0000 mg | SUBCUTANEOUS | Status: DC
  Administered 2014-09-08: 40 mg via SUBCUTANEOUS
  Filled 2014-09-08 (×2): qty 0.4

## 2014-09-08 MED ORDER — DIPHENHYDRAMINE HCL 25 MG PO CAPS: 25.0000 mg | ORAL_CAPSULE | Freq: Four times a day (QID) | ORAL | Status: DC | PRN

## 2014-09-08 NOTE — Progress Notes (Signed)
[facility] Progress Note  Subjective: Feels better today. Less abdominal pain. Had a BM.  Objective: Vital signs in last 24 hours: Temp:  [98.3 F (36.8 C)-98.8 F (37.1 C)] 98.3 F (36.8 C) (12/02 0635) Pulse Rate:  [92] 92 (12/02 0635) Resp:  [12-20] 20 (12/02 0635) BP: (103-127)/(63-73) 127/73 mmHg (12/02 0635) SpO2:  [97 %-98 %] 98 % (12/02 0635) Weight change:    PE: Abdomen less tender.  Lab Results: Results for orders placed or performed during the hospital encounter of 09/06/14 (from the past 24 hour(s))  Glucose, capillary     Status: Abnormal   Collection Time: 09/07/14 12:32 PM  Result Value Ref Range   Glucose-Capillary 106 (H) 70 - 99 mg/dL  Glucose, capillary     Status: Abnormal   Collection Time: 09/07/14  5:24 PM  Result Value Ref Range   Glucose-Capillary 189 (H) 70 - 99 mg/dL  Glucose, capillary     Status: Abnormal   Collection Time: 09/07/14  7:42 PM  Result Value Ref Range   Glucose-Capillary 167 (H) 70 - 99 mg/dL   Comment 1 Notify RN    Comment 2 Documented in Chart   Comprehensive metabolic panel     Status: Abnormal   Collection Time: 09/08/14  5:56 AM  Result Value Ref Range   Sodium 137 137 - 147 mEq/L   Potassium 4.5 3.7 - 5.3 mEq/L   Chloride 102 96 - 112 mEq/L   CO2 24 19 - 32 mEq/L   Glucose, Bld 164 (H) 70 - 99 mg/dL   BUN 11 6 - 23 mg/dL   Creatinine, Ser 0.73 0.50 - 1.35 mg/dL   Calcium 8.6 8.4 - 10.5 mg/dL   Total Protein 6.9 6.0 - 8.3 g/dL   Albumin 3.1 (L) 3.5 - 5.2 g/dL   AST 25 0 - 37 U/L   ALT 27 0 - 53 U/L   Alkaline Phosphatase 108 39 - 117 U/L   Total Bilirubin 0.4 0.3 - 1.2 mg/dL   GFR calc non Af Amer >90 >90 mL/min   GFR calc Af Amer >90 >90 mL/min   Anion gap 11 5 - 15  CBC     Status: Abnormal   Collection Time: 09/08/14  5:56 AM  Result Value Ref Range   WBC 4.3 4.0 - 10.5 K/uL   RBC 4.30 4.22 - 5.81 MIL/uL   Hemoglobin 11.5 (L) 13.0 - 17.0 g/dL   HCT 35.3 (L) 39.0 - 52.0 %   MCV 82.1 78.0 -  100.0 fL   MCH 26.7 26.0 - 34.0 pg   MCHC 32.6 30.0 - 36.0 g/dL   RDW 13.3 11.5 - 15.5 %   Platelets 243 150 - 400 K/uL  Glucose, capillary     Status: Abnormal   Collection Time: 09/08/14  7:57 AM  Result Value Ref Range   Glucose-Capillary 170 (H) 70 - 99 mg/dL    Studies/Results: No results found.    Assessment: Abdominal pain with abnormal CT scan. Rule out infectious enteritis. GI pathogens panel pending. Rule out Crohn's. Rule out eosinophilic enteritis.  Plan: If no infection is found to explain this, I would start him on Prednisone.  Advance to full liquid.    GANEM,SALEM F 09/08/2014, 11:38 AM

## 2014-09-08 NOTE — Plan of Care (Signed)
Problem: Phase I Progression Outcomes Goal: Pain controlled with appropriate interventions Outcome: Completed/Met Date Met:  09/08/14 Goal: Hemodynamically stable Outcome: Completed/Met Date Met:  09/08/14 Goal: Other Phase I Outcomes/Goals Outcome: Completed/Met Date Met:  09/08/14  Problem: Phase II Progression Outcomes Goal: Vital signs remain stable Outcome: Completed/Met Date Met:  09/08/14  Problem: Phase III Progression Outcomes Goal: Pain controlled on oral analgesia Outcome: Not Applicable Date Met:  47/20/72  Problem: Discharge Progression Outcomes Goal: Pain controlled with appropriate interventions Outcome: Completed/Met Date Met:  09/08/14 Goal: Hemodynamically stable Outcome: Completed/Met Date Met:  09/08/14

## 2014-09-08 NOTE — Progress Notes (Signed)
[facility] PROGRESS NOTE  Richard [name] [id number]:[id number] DOB: [birth year] DOA: 09/06/2014 PCP: Pcp Not In System  Assessment/Plan: #1 abdominal pain Questionable etiology. Differential includes infectious versus inflammatory versus eosinophilic enteritis. GI pathogen panel is pending. Patient with clinical improvement. Continue current empiric Ciprofloxacin and IV Flagyl. Diet has been advanced to full liquids per GI. Per GI if infectious workup is negative patient will probably be started on prednisone. GI following and appreciate input and recommendations.  #2 diabetes mellitus Check a hemoglobin A1c. Hold oral hypoglycemic agents. Sliding scale insulin. We'll resume patient's Lyrica.  #3 asthma Stable.  #4 hypertension Stable. Oral antihypertensive medications on hold. Follow.  #5 prophylaxis Lovenox for DVT prophylaxis.    Code Status: Full Family Communication: Updated patient. No family present. Disposition Plan: Home when medically stable.   Consultants:  Gastroenterology: Dr. Penelope Coop 09/07/2014  Procedures:  CT abdomen and pelvis 09/06/2014  Antibiotics:  IV ciprofloxacin 09/07/2014  IV Flagyl 09/07/2014  HPI/Subjective: Patient denies any nausea or vomiting. Patient states abdominal pain has improved since admission. Patient states had a bowel movement yesterday. Patient passing gas. Patient complaining of burning in his feet.  Objective: Filed Vitals:   09/08/14 0635  BP: 127/73  Pulse: 92  Temp: 98.3 F (36.8 C)  Resp: 20    Intake/Output Summary (Last 24 hours) at 09/08/14 1155 Last data filed at 09/08/14 0957  Gross per 24 hour  Intake 4743.42 ml  Output      0 ml  Net 4743.42 ml   Filed Weights   09/06/14 1519 09/07/14 0013  Weight: 90.719 kg (200 lb) 94.5 kg (208 lb 5.4 oz)    Exam:   General:  NAD  Cardiovascular: RRR  Respiratory: CTAB  Abdomen: Soft, positive bowel sounds, tenderness to palpation right upper quadrant and  epigastric region, nondistended  Musculoskeletal: No clubbing cyanosis or edema   Data Reviewed: Basic Metabolic Panel:  Recent Labs Lab 09/06/14 1541 09/07/14 0351 09/08/14 0556  NA 132* 134* 137  K 4.8 4.5 4.5  CL 95* 99 102  CO2 21 22 24   GLUCOSE 174* 117* 164*  BUN 22 25* 11  CREATININE 1.12 1.21 0.73  CALCIUM 8.9 8.0* 8.6   Liver Function Tests:  Recent Labs Lab 09/06/14 1541 09/07/14 0351 09/08/14 0556  AST 18 16 25   ALT 26 20 27   ALKPHOS 120* 96 108  BILITOT 0.4 0.4 0.4  PROT 7.9 6.4 6.9  ALBUMIN 3.4* 2.9* 3.1*    Recent Labs Lab 09/06/14 1541  LIPASE 19   No results for input(s): AMMONIA in the last 168 hours. CBC:  Recent Labs Lab 09/06/14 1541 09/07/14 0351 09/08/14 0556  WBC 7.8 5.7 4.3  NEUTROABS 4.8 3.2  --   HGB 14.9 12.4* 11.5*  HCT 44.4 37.9* 35.3*  MCV 80.9 83.5 82.1  PLT 332 262 243   Cardiac Enzymes: No results for input(s): CKTOTAL, CKMB, CKMBINDEX, TROPONINI in the last 168 hours. BNP (last 3 results) No results for input(s): PROBNP in the last 8760 hours. CBG:  Recent Labs Lab 09/07/14 1232 09/07/14 1724 09/07/14 1942 09/08/14 0757 09/08/14 1145  GLUCAP 106* 189* 167* 170* 115*    No results found for this or any previous visit (from the past 240 hour(s)).   Studies: Ct Abdomen Pelvis W Contrast  09/06/2014   CLINICAL DATA:  Abdominal pain, lack of appetite, dizziness and nausea.  EXAM: CT ABDOMEN AND PELVIS WITH CONTRAST  TECHNIQUE: Multidetector CT imaging of the abdomen and pelvis was performed  using the standard protocol following bolus administration of intravenous contrast.  CONTRAST:  137mL OMNIPAQUE IOHEXOL 300 MG/ML  SOLN  COMPARISON:  05/21/2014  FINDINGS: There are multiple left-sided jejunal small bowel loops demonstrating diffuse thickening and edema with wall thickness approaching 13 mm. These loops are also dilated with maximum diameter of approximately 3.5 cm. Oral contrast does transit through this  region into more distal small bowel. At the level of thickened and dilated jejunum, there is prominent surrounding edema as well as some free fluid interspersed in bowel loops. There is some fecalization bowel contents and thickened jejunum. No overt pneumatosis is identified.  Free fluid is also present around the liver and moderate free fluid is present in the pelvis. Small bowel findings are consistent with partial obstruction and significant inflammation/enteritis. Close clinical correlation is suggested as these loops are potentially necrotic or ischemic based on the significant inflammation and free fluid present. There is no evidence of free intraperitoneal air or focal abscess. No obvious internal hernia or closed loop obstruction by CT.  The rest of the bowel shows no significant abnormalities. The stomach is not distended. The liver, gallbladder, pancreas, spleen, adrenal glands and kidneys are unremarkable. Scattered small mesenteric lymph nodes are identified. No evidence of hernia. The bladder is decompressed. No abnormal calcifications identified. Degenerative changes are present at the L5-S1 level.  IMPRESSION: Multiple thickened, dilated and edematous left-sided jejunal small bowel loops. Oral contrast does transit through this level. Findings are consistent with significant inflammation/ enteritis as well as a probable component of partial obstruction. The presence of significant edema as well as moderate free fluid may imply potential ischemia/necrosis of bowel.   Electronically Signed   By: Aletta Edouard M.D.   On: 09/06/2014 21:53    Scheduled Meds: . antiseptic oral rinse  7 mL Mouth Rinse BID  . ciprofloxacin  400 mg Intravenous Q12H  . insulin aspart  0-9 Units Subcutaneous TID WC  . metronidazole  500 mg Intravenous Q8H  . pregabalin  75 mg Oral BID   Continuous Infusions: . sodium chloride 125 mL/hr at 09/08/14 0424    Principal Problem:   Abdominal pain Active Problems:    Diabetes mellitus type 2, controlled   Asthma    Time spent: 35 minutes    [facility] MD [facility] Pager [phone]. If 7PM-7AM, please contact night-coverage at [email or web address], password [facility] 09/08/2014, 11:55 AM  LOS: 2 days

## 2014-09-09 DIAGNOSIS — A0472 Enterocolitis due to Clostridium difficile, not specified as recurrent: Secondary | ICD-10-CM

## 2014-09-09 DIAGNOSIS — K529 Noninfective gastroenteritis and colitis, unspecified: Secondary | ICD-10-CM | POA: Insufficient documentation

## 2014-09-09 DIAGNOSIS — A047 Enterocolitis due to Clostridium difficile: Principal | ICD-10-CM

## 2014-09-09 HISTORY — DX: Enterocolitis due to Clostridium difficile, not specified as recurrent: A04.72

## 2014-09-09 LAB — CBC
HCT: 34.1 % — ABNORMAL LOW (ref 39.0–52.0)
Hemoglobin: 11.1 g/dL — ABNORMAL LOW (ref 13.0–17.0)
MCH: 26.7 pg (ref 26.0–34.0)
MCHC: 32.6 g/dL (ref 30.0–36.0)
MCV: 82 fL (ref 78.0–100.0)
PLATELETS: 233 10*3/uL (ref 150–400)
RBC: 4.16 MIL/uL — AB (ref 4.22–5.81)
RDW: 13.3 % (ref 11.5–15.5)
WBC: 3.4 10*3/uL — AB (ref 4.0–10.5)

## 2014-09-09 LAB — BASIC METABOLIC PANEL
ANION GAP: 13 (ref 5–15)
BUN: 6 mg/dL (ref 6–23)
CHLORIDE: 105 meq/L (ref 96–112)
CO2: 21 meq/L (ref 19–32)
Calcium: 8.6 mg/dL (ref 8.4–10.5)
Creatinine, Ser: 0.7 mg/dL (ref 0.50–1.35)
GFR calc Af Amer: >90 mL/min (ref >90)
GFR calc non Af Amer: >90 mL/min (ref >90)
Glucose, Bld: 120 mg/dL — ABNORMAL HIGH (ref 70–99)
Potassium: 4.5 mEq/L (ref 3.7–5.3)
Sodium: 139 mEq/L (ref 137–147)

## 2014-09-09 LAB — GLUCOSE, CAPILLARY
GLUCOSE-CAPILLARY: 114 mg/dL — AB (ref 70–99)
Glucose-Capillary: 226 mg/dL — ABNORMAL HIGH (ref 70–99)

## 2014-09-09 MED ORDER — ACETAMINOPHEN 325 MG PO TABS: 650.0000 mg | ORAL_TABLET | Freq: Four times a day (QID) | ORAL | Status: DC | PRN

## 2014-09-09 MED ORDER — METRONIDAZOLE 500 MG PO TABS
500.0000 mg | ORAL_TABLET | Freq: Three times a day (TID) | ORAL | Status: DC
Start: 2014-09-09 — End: 2015-09-26

## 2014-09-09 MED ORDER — METRONIDAZOLE 500 MG PO TABS
500.0000 mg | ORAL_TABLET | Freq: Three times a day (TID) | ORAL | Status: DC
  Administered 2014-09-09: 500 mg via ORAL
  Filled 2014-09-09 (×3): qty 1

## 2014-09-09 NOTE — Plan of Care (Signed)
Problem: Phase II Progression Outcomes Goal: Discharge plan established Outcome: Progressing  Problem: Phase III Progression Outcomes Goal: Discharge plan remains appropriate-arrangements made Outcome: Progressing  Problem: Discharge Progression Outcomes Goal: Discharge plan in place and appropriate Outcome: Progressing

## 2014-09-09 NOTE — Progress Notes (Signed)
Pt verbally understood DC instructions, no questions asked, Pt education on C diff printed and given to pt.Marland Kitchen

## 2014-09-09 NOTE — Care Management Note (Signed)
    Page 1 of 1   09/09/2014     2:54:21 PM CARE MANAGEMENT NOTE 09/09/2014  Patient:  Richard [name], Richard [name]   Account Number:  [id number]  Date Initiated:  09/09/2014  Documentation initiated by:  Tomi Bamberger  Subjective/Objective Assessment:   dx c diff, abd pain  admit- lives with daughter.     Action/Plan:   Anticipated DC Date:  09/09/2014   Anticipated DC Plan:  [place]  CM consult      Choice offered to / List presented to:             Status of service:  Completed, signed off Medicare Important Message given?  YES (If response is "NO", the following Medicare IM given date fields will be blank) Date Medicare IM given:  09/09/2014 Medicare IM given by:  Tomi Bamberger Date Additional Medicare IM given:   Additional Medicare IM given by:    Discharge Disposition:  HOME/SELF CARE  Per UR Regulation:  Reviewed for med. necessity/level of care/duration of stay  If discussed at [place] of Stay Meetings, dates discussed:    Comments:  09/09/14 [place], BSN [phone] patient is for dc today, on oral flagyl.

## 2014-09-09 NOTE — Progress Notes (Signed)
[facility] Progress Note  Subjective: The patient states that he feels great today. He has no abdominal pain. He is hungry. Stool GI pathogens panel showed positive C. difficile.  Objective: Vital signs in last 24 hours: Temp:  [98 F (36.7 C)-98.5 F (36.9 C)] 98.5 F (36.9 C) (12/03 0616) Pulse Rate:  [80-85] 80 (12/03 0616) Resp:  [18] 18 (12/03 0616) BP: (133-152)/(73-77) 133/73 mmHg (12/03 0616) SpO2:  [98 %-100 %] 98 % (12/03 0616) Weight change:    PE:  He is in no distress  Heart regular rhythm  Abdomen: Soft and nontender  Lab Results: Results for orders placed or performed during the hospital encounter of 09/06/14 (from the past 24 hour(s))  Glucose, capillary     Status: Abnormal   Collection Time: 09/08/14 11:45 AM  Result Value Ref Range   Glucose-Capillary 115 (H) 70 - 99 mg/dL  Glucose, capillary     Status: Abnormal   Collection Time: 09/08/14  5:38 PM  Result Value Ref Range   Glucose-Capillary 209 (H) 70 - 99 mg/dL  Glucose, capillary     Status: Abnormal   Collection Time: 09/08/14 10:11 PM  Result Value Ref Range   Glucose-Capillary 106 (H) 70 - 99 mg/dL  CBC     Status: Abnormal   Collection Time: 09/09/14  5:20 AM  Result Value Ref Range   WBC 3.4 (L) 4.0 - 10.5 K/uL   RBC 4.16 (L) 4.22 - 5.81 MIL/uL   Hemoglobin 11.1 (L) 13.0 - 17.0 g/dL   HCT 34.1 (L) 39.0 - 52.0 %   MCV 82.0 78.0 - 100.0 fL   MCH 26.7 26.0 - 34.0 pg   MCHC 32.6 30.0 - 36.0 g/dL   RDW 13.3 11.5 - 15.5 %   Platelets 233 150 - 400 K/uL  Basic metabolic panel     Status: Abnormal   Collection Time: 09/09/14  5:20 AM  Result Value Ref Range   Sodium 139 137 - 147 mEq/L   Potassium 4.5 3.7 - 5.3 mEq/L   Chloride 105 96 - 112 mEq/L   CO2 21 19 - 32 mEq/L   Glucose, Bld 120 (H) 70 - 99 mg/dL   BUN 6 6 - 23 mg/dL   Creatinine, Ser 0.70 0.50 - 1.35 mg/dL   Calcium 8.6 8.4 - 10.5 mg/dL   GFR calc non Af Amer >90 >90 mL/min   GFR calc Af Amer >90 >90 mL/min   Anion gap 13 5 - 15  Glucose, capillary     Status: Abnormal   Collection Time: 09/09/14  8:09 AM  Result Value Ref Range   Glucose-Capillary 114 (H) 70 - 99 mg/dL    Studies/Results: No results found.    Assessment: Clostridium difficile  Plan: Cipro discontinued. By mouth Flagyl started. Advance diet. If he does well he can go home.    Tamala Manzer F 09/09/2014, 10:06 AM

## 2014-09-09 NOTE — Plan of Care (Signed)
Problem: Phase III Progression Outcomes Goal: IV/normal saline lock discontinued Outcome: Completed/Met Date Met:  09/09/14

## 2014-09-09 NOTE — Progress Notes (Signed)
DC home with daughter at this time

## 2014-09-09 NOTE — Plan of Care (Signed)
Problem: Phase III Progression Outcomes Goal: Discharge plan remains appropriate-arrangements made Outcome: Progressing     

## 2014-09-09 NOTE — Discharge Summary (Signed)
Physician Discharge Summary  Richard [name] DGL:[id number] DOB: 1960-10-14 DOA: 09/06/2014  PCP: Pcp Not In System  Admit date: 09/06/2014 Discharge date: 09/09/2014  Time spent: 65 minutes  Recommendations for Outpatient Follow-up:  1. Follow-up with PCP as scheduled on 09/14/2014. On follow-up patient will need a basic metabolic profile done to follow-up on his electrolytes and renal function. Patient also needs a magnesium level checked.  Discharge Diagnoses:  Principal Problem:   Clostridium difficile colitis Active Problems:   Abdominal pain   Diabetes mellitus type 2, controlled   Asthma   Discharge Condition: Stable and improved area  Diet recommendation: Carb modified diet  Filed Weights   09/06/14 1519 09/07/14 0013  Weight: 90.719 kg (200 lb) 94.5 kg (208 lb 5.4 oz)    History of present illness:  Richard [name] is a 53 y.o. male with history of diabetes mellitus and asthma who presented to the ER because of abdominal pain. Patient had been having abdominal pain over the last 3 days prior to admission. Pain was mostly in the epigastric area. Pain was colicky in nature with nausea and no vomiting or diarrhea. Patient had not moved his bowels since Saturday, 2 days prior to admission. Patient had a bowel movement after he used laxative on Saturday. In the ER CT abdomen and pelvis done showed possible inflammation around the jejunal area. On call surgeon Dr. Hulen Skains was consulted by the ER physician. Patient had a similar episode 2 months prior to admission. Patient stated with treatment of antibiotics his symptoms had resolved at that time.    Hospital Course:  #1 C. difficile enteritis/colitis Patient had presented with abdominal pain ongoing for 3 days prior to admission. Abdominal pain was colicky in nature with nausea. Patient denied any emesis or diarrhea. In the ED CT of the abdomen and pelvis which was done showed possible inflammation around the jejunal area. Patient  empirically started on IV ciprofloxacin and IV Flagyl and admitted to the [place] floor. Patient was initially maintained nothing by mouth and GI consultation was obtained. Patient was seen in consultation by Dr. Penelope Coop of [facility] gastroenterology. It was felt that in the differential include an infectious enteritis versus inflammatory inflammatory/Crohn's versus an eosinophilic enteritis as patient was noted to have elevated eosinophils. Patient was placed on clear liquid diet. Patient improved clinically during the hospitalization. GI pathogen panel came back positive for C. difficile colitis. IV ciprofloxacin was subsequently discontinued. Patient was transitioned to oral Flagyl which he tolerated. Patient's diet was advanced to a carbohydrate modified diet which he tolerated. Patient improved clinically did not have any further abdominal pain. Patient tolerated oral Flagyl. Patient will be discharged home on 9 more days of oral Flagyl to complete a ten-day course of antibiotic therapy. Patient will follow-up with PCP as outpatient.  #2 well-controlled diabetes mellitus Hemoglobin A1c which was checked during the hospitalization came back at 6.8 which was drawn on 09/08/2014. Patient's oral hypoglycemic agents were held during the hospitalization and patient was maintained on a sliding scale insulin. Patient will be discharged on his oral hypoglycemic agents and will follow-up with PCP as outpatient.   #3 asthma Stable throughout the hospitalization.  #4 hypertension Stable. Oral antihypertensive medications were held initially during the hospitalization secondary to borderline blood pressure. Oral hypertensives will be resumed on discharge.    Procedures:  CT abdomen and pelvis 09/06/2014  Consultations:  Gastroenterology: Dr. Penelope Coop 09/07/2014  Discharge Exam: Filed Vitals:   09/09/14 1417  BP: 150/68  Pulse: 80  Temp: 98.4 F (36.9 C)  Resp: 16    General: NAD Cardiovascular:  RRR Respiratory: CTAB Abdomen: Soft, nontender, nondistended, positive bowel sounds.  Discharge Instructions You were cared for by a hospitalist during your hospital stay. If you have any questions about your discharge medications or the care you received while you were in the hospital after you are discharged, you can call the unit and asked to speak with the hospitalist on call if the hospitalist that took care of you is not available. Once you are discharged, your primary care physician will handle any further medical issues. Please note that NO REFILLS for any discharge medications will be authorized once you are discharged, as it is imperative that you return to your primary care physician (or establish a relationship with a primary care physician if you do not have one) for your aftercare needs so that they can reassess your need for medications and monitor your lab values.  Discharge Instructions    Diet Carb Modified    Complete by:  As directed      Discharge instructions    Complete by:  As directed   Follow up with MD/PCP as outpatient     Increase activity slowly    Complete by:  As directed           Current Discharge Medication List    START taking these medications   Details  acetaminophen (TYLENOL) 325 MG tablet Take 2 tablets (650 mg total) by mouth every 6 (six) hours as needed for mild pain (or Fever >/= 101).    metroNIDAZOLE (FLAGYL) 500 MG tablet Take 1 tablet (500 mg total) by mouth every 8 (eight) hours. Take for 9 days then stop. Qty: 27 tablet, Refills: 0      CONTINUE these medications which have NOT CHANGED   Details  albuterol (PROVENTIL HFA;VENTOLIN HFA) 108 (90 BASE) MCG/ACT inhaler Inhale 2 puffs into the lungs every 6 (six) hours as needed for wheezing or shortness of breath.    docusate sodium (COLACE) 100 MG capsule Take 200 mg by mouth daily as needed for mild constipation.    glipiZIDE (GLUCOTROL) 10 MG tablet Take 10 mg by mouth 2 (two) times  daily.    ibuprofen (ADVIL,MOTRIN) 800 MG tablet Take 800 mg by mouth every 8 (eight) hours as needed for moderate pain.    lisinopril (PRINIVIL,ZESTRIL) 2.5 MG tablet Take 2.5 mg by mouth daily.    metFORMIN (GLUCOPHAGE) 1000 MG tablet Take 1,000 mg by mouth 2 (two) times daily with a meal.    pregabalin (LYRICA) 75 MG capsule Take 75 mg by mouth 2 (two) times daily.       No Known Allergies Follow-up Information    Follow up with Pcp Not In System On 09/14/2014.   Why:  f/u with PCP as scheduled       The results of significant diagnostics from this hospitalization (including imaging, microbiology, ancillary and laboratory) are listed below for reference.    Significant Diagnostic Studies: Ct Abdomen Pelvis W Contrast  09/06/2014   CLINICAL DATA:  Abdominal pain, lack of appetite, dizziness and nausea.  EXAM: CT ABDOMEN AND PELVIS WITH CONTRAST  TECHNIQUE: Multidetector CT imaging of the abdomen and pelvis was performed using the standard protocol following bolus administration of intravenous contrast.  CONTRAST:  153mL OMNIPAQUE IOHEXOL 300 MG/ML  SOLN  COMPARISON:  05/21/2014  FINDINGS: There are multiple left-sided jejunal small bowel loops demonstrating diffuse thickening and edema with wall thickness approaching  13 mm. These loops are also dilated with maximum diameter of approximately 3.5 cm. Oral contrast does transit through this region into more distal small bowel. At the level of thickened and dilated jejunum, there is prominent surrounding edema as well as some free fluid interspersed in bowel loops. There is some fecalization bowel contents and thickened jejunum. No overt pneumatosis is identified.  Free fluid is also present around the liver and moderate free fluid is present in the pelvis. Small bowel findings are consistent with partial obstruction and significant inflammation/enteritis. Close clinical correlation is suggested as these loops are potentially necrotic or  ischemic based on the significant inflammation and free fluid present. There is no evidence of free intraperitoneal air or focal abscess. No obvious internal hernia or closed loop obstruction by CT.  The rest of the bowel shows no significant abnormalities. The stomach is not distended. The liver, gallbladder, pancreas, spleen, adrenal glands and kidneys are unremarkable. Scattered small mesenteric lymph nodes are identified. No evidence of hernia. The bladder is decompressed. No abnormal calcifications identified. Degenerative changes are present at the L5-S1 level.  IMPRESSION: Multiple thickened, dilated and edematous left-sided jejunal small bowel loops. Oral contrast does transit through this level. Findings are consistent with significant inflammation/ enteritis as well as a probable component of partial obstruction. The presence of significant edema as well as moderate free fluid may imply potential ischemia/necrosis of bowel.   Electronically Signed   By: Aletta Edouard M.D.   On: 09/06/2014 21:53    Microbiology: No results found for this or any previous visit (from the past 240 hour(s)).   Labs: Basic Metabolic Panel:  Recent Labs Lab 09/06/14 1541 09/07/14 0351 09/08/14 0556 09/09/14 0520  NA 132* 134* 137 139  K 4.8 4.5 4.5 4.5  CL 95* 99 102 105  CO2 21 22 24 21   GLUCOSE 174* 117* 164* 120*  BUN 22 25* 11 6  CREATININE 1.12 1.21 0.73 0.70  CALCIUM 8.9 8.0* 8.6 8.6   Liver Function Tests:  Recent Labs Lab 09/06/14 1541 09/07/14 0351 09/08/14 0556  AST 18 16 25   ALT 26 20 27   ALKPHOS 120* 96 108  BILITOT 0.4 0.4 0.4  PROT 7.9 6.4 6.9  ALBUMIN 3.4* 2.9* 3.1*    Recent Labs Lab 09/06/14 1541  LIPASE 19   No results for input(s): AMMONIA in the last 168 hours. CBC:  Recent Labs Lab 09/06/14 1541 09/07/14 0351 09/08/14 0556 09/09/14 0520  WBC 7.8 5.7 4.3 3.4*  NEUTROABS 4.8 3.2  --   --   HGB 14.9 12.4* 11.5* 11.1*  HCT 44.4 37.9* 35.3* 34.1*  MCV 80.9  83.5 82.1 82.0  PLT 332 262 243 233   Cardiac Enzymes: No results for input(s): CKTOTAL, CKMB, CKMBINDEX, TROPONINI in the last 168 hours. BNP: BNP (last 3 results) No results for input(s): PROBNP in the last 8760 hours. CBG:  Recent Labs Lab 09/08/14 1145 09/08/14 1738 09/08/14 2211 09/09/14 0809 09/09/14 1200  GLUCAP 115* 209* 106* 114* 226*       Signed:  THOMPSON,DANIEL MD [facility] 09/09/2014, 2:29 PM

## 2015-09-24 ENCOUNTER — Encounter (HOSPITAL_COMMUNITY): Payer: Self-pay | Admitting: *Deleted

## 2015-09-24 ENCOUNTER — Inpatient Hospital Stay (HOSPITAL_COMMUNITY)
Admission: EM | Admit: 2015-09-24 | Discharge: 2015-09-26 | DRG: 287 | Disposition: A | Discharge status: Home / Self Care | Payer: Medicare Other | Attending: Internal Medicine | Admitting: Internal Medicine

## 2015-09-24 ENCOUNTER — Emergency Department (HOSPITAL_COMMUNITY): Payer: Medicare Other

## 2015-09-24 DIAGNOSIS — F419 Anxiety disorder, unspecified: Secondary | ICD-10-CM | POA: Diagnosis present

## 2015-09-24 DIAGNOSIS — I10 Essential (primary) hypertension: Secondary | ICD-10-CM | POA: Insufficient documentation

## 2015-09-24 DIAGNOSIS — I2510 Atherosclerotic heart disease of native coronary artery without angina pectoris: Secondary | ICD-10-CM | POA: Diagnosis present

## 2015-09-24 DIAGNOSIS — E7800 Pure hypercholesterolemia, unspecified: Secondary | ICD-10-CM | POA: Diagnosis present

## 2015-09-24 DIAGNOSIS — J4541 Moderate persistent asthma with (acute) exacerbation: Secondary | ICD-10-CM | POA: Diagnosis present

## 2015-09-24 DIAGNOSIS — Z79899 Other long term (current) drug therapy: Secondary | ICD-10-CM | POA: Diagnosis not present

## 2015-09-24 DIAGNOSIS — Z7984 Long term (current) use of oral hypoglycemic drugs: Secondary | ICD-10-CM

## 2015-09-24 DIAGNOSIS — I200 Unstable angina: Secondary | ICD-10-CM | POA: Diagnosis present

## 2015-09-24 DIAGNOSIS — R079 Chest pain, unspecified: Secondary | ICD-10-CM | POA: Diagnosis present

## 2015-09-24 DIAGNOSIS — R0789 Other chest pain: Principal | ICD-10-CM | POA: Diagnosis present

## 2015-09-24 DIAGNOSIS — J069 Acute upper respiratory infection, unspecified: Secondary | ICD-10-CM | POA: Diagnosis present

## 2015-09-24 DIAGNOSIS — F17210 Nicotine dependence, cigarettes, uncomplicated: Secondary | ICD-10-CM | POA: Diagnosis present

## 2015-09-24 DIAGNOSIS — J449 Chronic obstructive pulmonary disease, unspecified: Secondary | ICD-10-CM | POA: Diagnosis present

## 2015-09-24 DIAGNOSIS — E1151 Type 2 diabetes mellitus with diabetic peripheral angiopathy without gangrene: Secondary | ICD-10-CM | POA: Insufficient documentation

## 2015-09-24 DIAGNOSIS — I340 Nonrheumatic mitral (valve) insufficiency: Secondary | ICD-10-CM | POA: Diagnosis present

## 2015-09-24 DIAGNOSIS — E785 Hyperlipidemia, unspecified: Secondary | ICD-10-CM | POA: Diagnosis present

## 2015-09-24 DIAGNOSIS — E1139 Type 2 diabetes mellitus with other diabetic ophthalmic complication: Secondary | ICD-10-CM | POA: Insufficient documentation

## 2015-09-24 DIAGNOSIS — F17201 Nicotine dependence, unspecified, in remission: Secondary | ICD-10-CM | POA: Insufficient documentation

## 2015-09-24 DIAGNOSIS — E1140 Type 2 diabetes mellitus with diabetic neuropathy, unspecified: Secondary | ICD-10-CM | POA: Diagnosis present

## 2015-09-24 DIAGNOSIS — J45909 Unspecified asthma, uncomplicated: Secondary | ICD-10-CM | POA: Diagnosis present

## 2015-09-24 DIAGNOSIS — J4489 Other specified chronic obstructive pulmonary disease: Secondary | ICD-10-CM | POA: Diagnosis present

## 2015-09-24 DIAGNOSIS — E1165 Type 2 diabetes mellitus with hyperglycemia: Secondary | ICD-10-CM | POA: Insufficient documentation

## 2015-09-24 LAB — COMPREHENSIVE METABOLIC PANEL
ALK PHOS: 141 U/L — AB (ref 38–126)
ALT: 36 U/L (ref 17–63)
AST: 21 U/L (ref 15–41)
Albumin: 3.4 g/dL — ABNORMAL LOW (ref 3.5–5.0)
Anion gap: 9 (ref 5–15)
BUN: 12 mg/dL (ref 6–20)
CALCIUM: 9.4 mg/dL (ref 8.9–10.3)
CHLORIDE: 100 mmol/L — AB (ref 101–111)
CO2: 24 mmol/L (ref 22–32)
CREATININE: 0.88 mg/dL (ref 0.61–1.24)
GFR calc non Af Amer: >60 mL/min (ref >60)
Glucose, Bld: 265 mg/dL — ABNORMAL HIGH (ref 65–99)
Potassium: 4.8 mmol/L (ref 3.5–5.1)
Sodium: 133 mmol/L — ABNORMAL LOW (ref 135–145)
Total Bilirubin: 0.5 mg/dL (ref 0.3–1.2)
Total Protein: 7.8 g/dL (ref 6.5–8.1)

## 2015-09-24 LAB — CBC WITH DIFFERENTIAL/PLATELET
Basophils Absolute: 0.1 10*3/uL (ref 0.0–0.1)
Basophils Relative: 1 %
EOS PCT: 10 %
Eosinophils Absolute: 0.5 10*3/uL (ref 0.0–0.7)
HCT: 39.1 % (ref 39.0–52.0)
Hemoglobin: 13 g/dL (ref 13.0–17.0)
LYMPHS ABS: 1.1 10*3/uL (ref 0.7–4.0)
Lymphocytes Relative: 21 %
MCH: 27.8 pg (ref 26.0–34.0)
MCHC: 33.2 g/dL (ref 30.0–36.0)
MCV: 83.7 fL (ref 78.0–100.0)
MONOS PCT: 10 %
Monocytes Absolute: 0.5 10*3/uL (ref 0.1–1.0)
NEUTROS PCT: 58 %
Neutro Abs: 3.1 10*3/uL (ref 1.7–7.7)
PLATELETS: 284 10*3/uL (ref 150–400)
RBC: 4.67 MIL/uL (ref 4.22–5.81)
RDW: 13.3 % (ref 11.5–15.5)
WBC: 5.3 10*3/uL (ref 4.0–10.5)

## 2015-09-24 LAB — LIPID PANEL
Cholesterol: 195 mg/dL (ref 0–200)
HDL: 44 mg/dL (ref >40)
LDL CALC: 113 mg/dL — AB (ref 0–99)
Total CHOL/HDL Ratio: 4.4 RATIO
Triglycerides: 188 mg/dL — ABNORMAL HIGH (ref <150)
VLDL: 38 mg/dL (ref 0–40)

## 2015-09-24 LAB — GLUCOSE, CAPILLARY
GLUCOSE-CAPILLARY: 230 mg/dL — AB (ref 65–99)
GLUCOSE-CAPILLARY: 268 mg/dL — AB (ref 65–99)
GLUCOSE-CAPILLARY: 137 mg/dL — AB (ref 65–99)

## 2015-09-24 LAB — TROPONIN I

## 2015-09-24 LAB — I-STAT TROPONIN, ED: TROPONIN I, POC: 0 ng/mL (ref 0.00–0.08)

## 2015-09-24 LAB — HEPARIN LEVEL (UNFRACTIONATED): HEPARIN UNFRACTIONATED: 0.4 [IU]/mL (ref 0.30–0.70)

## 2015-09-24 MED ORDER — ENOXAPARIN SODIUM 40 MG/0.4ML ~~LOC~~ SOLN: 40.0000 mg | SUBCUTANEOUS | Status: DC

## 2015-09-24 MED ORDER — ACETAMINOPHEN 325 MG PO TABS: 650.0000 mg | ORAL_TABLET | Freq: Four times a day (QID) | ORAL | Status: DC | PRN

## 2015-09-24 MED ORDER — INSULIN ASPART 100 UNIT/ML ~~LOC~~ SOLN
0.0000 [IU] | Freq: Three times a day (TID) | SUBCUTANEOUS | Status: DC
  Administered 2015-09-24: 5 [IU] via SUBCUTANEOUS
  Administered 2015-09-25: 2 [IU] via SUBCUTANEOUS
  Administered 2015-09-25: 1 [IU] via SUBCUTANEOUS
  Administered 2015-09-25 – 2015-09-26 (×3): 2 [IU] via SUBCUTANEOUS

## 2015-09-24 MED ORDER — MORPHINE SULFATE (PF) 4 MG/ML IV SOLN
4.0000 mg | Freq: Once | INTRAVENOUS | Status: AC
  Administered 2015-09-24: 4 mg via INTRAVENOUS
  Filled 2015-09-24: qty 1

## 2015-09-24 MED ORDER — INSULIN ASPART 100 UNIT/ML ~~LOC~~ SOLN: 0.0000 [IU] | Freq: Every day | SUBCUTANEOUS | Status: DC

## 2015-09-24 MED ORDER — ALBUTEROL SULFATE (2.5 MG/3ML) 0.083% IN NEBU
5.0000 mg | INHALATION_SOLUTION | Freq: Once | RESPIRATORY_TRACT | Status: AC
  Administered 2015-09-24: 5 mg via RESPIRATORY_TRACT
  Filled 2015-09-24: qty 6

## 2015-09-24 MED ORDER — ONDANSETRON HCL 4 MG/2ML IJ SOLN: 4.0000 mg | Freq: Four times a day (QID) | INTRAMUSCULAR | Status: DC | PRN

## 2015-09-24 MED ORDER — SODIUM CHLORIDE 0.9 % IJ SOLN: 3.0000 mL | Freq: Two times a day (BID) | INTRAMUSCULAR | Status: DC

## 2015-09-24 MED ORDER — ONDANSETRON HCL 4 MG/2ML IJ SOLN: 4.0000 mg | Freq: Three times a day (TID) | INTRAMUSCULAR | Status: DC | PRN

## 2015-09-24 MED ORDER — MORPHINE SULFATE (PF) 2 MG/ML IV SOLN
2.0000 mg | INTRAVENOUS | Status: DC | PRN
  Filled 2015-09-24: qty 1

## 2015-09-24 MED ORDER — HEPARIN (PORCINE) IN NACL 100-0.45 UNIT/ML-% IJ SOLN
1350.0000 [IU]/h | INTRAMUSCULAR | Status: DC
  Administered 2015-09-24 – 2015-09-26 (×3): 1350 [IU]/h via INTRAVENOUS
  Filled 2015-09-24 (×3): qty 250

## 2015-09-24 MED ORDER — PREGABALIN 50 MG PO CAPS
75.0000 mg | ORAL_CAPSULE | Freq: Three times a day (TID) | ORAL | Status: DC
  Administered 2015-09-24 – 2015-09-26 (×7): 75 mg via ORAL
  Filled 2015-09-24 (×7): qty 1

## 2015-09-24 MED ORDER — HEPARIN BOLUS VIA INFUSION
4000.0000 [IU] | Freq: Once | INTRAVENOUS | Status: AC
  Administered 2015-09-24: 4000 [IU] via INTRAVENOUS
  Filled 2015-09-24: qty 4000

## 2015-09-24 MED ORDER — NITROGLYCERIN 0.4 MG SL SUBL
0.4000 mg | SUBLINGUAL_TABLET | SUBLINGUAL | Status: DC | PRN
  Administered 2015-09-25: 0.4 mg via SUBLINGUAL
  Filled 2015-09-24: qty 1

## 2015-09-24 MED ORDER — NICOTINE 7 MG/24HR TD PT24
7.0000 mg | MEDICATED_PATCH | Freq: Every day | TRANSDERMAL | Status: DC
  Administered 2015-09-24 – 2015-09-26 (×3): 7 mg via TRANSDERMAL
  Filled 2015-09-24 (×4): qty 1

## 2015-09-24 MED ORDER — ONDANSETRON HCL 4 MG/2ML IJ SOLN
4.0000 mg | Freq: Once | INTRAMUSCULAR | Status: AC
  Administered 2015-09-24: 4 mg via INTRAVENOUS
  Filled 2015-09-24: qty 2

## 2015-09-24 MED ORDER — ALBUTEROL SULFATE (2.5 MG/3ML) 0.083% IN NEBU
2.5000 mg | INHALATION_SOLUTION | Freq: Four times a day (QID) | RESPIRATORY_TRACT | Status: DC | PRN
Start: 2015-09-24 — End: 2015-09-26
  Administered 2015-09-25: 2.5 mg via RESPIRATORY_TRACT
  Filled 2015-09-24 (×3): qty 3

## 2015-09-24 MED ORDER — ASPIRIN EC 81 MG PO TBEC
81.0000 mg | DELAYED_RELEASE_TABLET | Freq: Every day | ORAL | Status: DC
  Administered 2015-09-25: 81 mg via ORAL
  Filled 2015-09-24 (×2): qty 1

## 2015-09-24 MED ORDER — LORAZEPAM 2 MG/ML IJ SOLN: 1.0000 mg | Freq: Once | INTRAMUSCULAR | Status: DC | PRN

## 2015-09-24 MED ORDER — ONDANSETRON HCL 4 MG PO TABS: 4.0000 mg | ORAL_TABLET | Freq: Four times a day (QID) | ORAL | Status: DC | PRN

## 2015-09-24 MED ORDER — ACETAMINOPHEN 650 MG RE SUPP: 650.0000 mg | Freq: Four times a day (QID) | RECTAL | Status: DC | PRN

## 2015-09-24 MED ORDER — LISINOPRIL 10 MG PO TABS
10.0000 mg | ORAL_TABLET | Freq: Every day | ORAL | Status: DC
  Administered 2015-09-25 – 2015-09-26 (×2): 10 mg via ORAL
  Filled 2015-09-24 (×2): qty 1

## 2015-09-24 MED ORDER — ASPIRIN 325 MG PO TABS: 325.0000 mg | ORAL_TABLET | Freq: Every day | ORAL | Status: DC

## 2015-09-24 MED ORDER — IPRATROPIUM BROMIDE 0.02 % IN SOLN
0.5000 mg | Freq: Once | RESPIRATORY_TRACT | Status: AC
  Administered 2015-09-24: 0.5 mg via RESPIRATORY_TRACT
  Filled 2015-09-24: qty 2.5

## 2015-09-24 NOTE — H&P (Signed)
Date: 09/24/2015               Patient Name:  Richard [name] MRN: [id number]  DOB: [birth year] Age / Sex: 54 y.o., male   PCP: Pcp Not In System         Medical Service: Internal Medicine Teaching Service         Attending Physician: Dr. Oval Linsey, MD    First Contact: Dr. Marlowe Sax Pager: [phone]  Second Contact: Dr. Marvel Plan Pager: [phone]       After Hours (After 5p/  First Contact Pager: [phone]  weekends / holidays): Second Contact Pager: [phone]   Chief Complaint: chest pain  History of Present Illness: Mr. Housholder is a 54 yo male with PMHx of T2DM (A1c 6.8 in 2015), asthma, and HTN who presents to the ED with a chief complaint of chest pain. Patient reports having a "bad cold" for the past 1 month. States he's been having non-productive cough and chest congestion. Reports having chest pain for the past 2 weeks that has been getting progressively worse. States the pain is 7-8/ 10 intensity, intermittent, and sharp in character. States it is located substernally and radiates to the right side of his chest. States the chest pain is better with rest and rubbing his chest; worse with sitting up and coughing. As per patient, the chest pain has been getting stronger and lasting longer over time - initially when the pain started it used to last 2-3 minutes each time but now each episode lasts 10 minutes. States the pain was unbearable early this morning and woke him up at 5 AM. This time the pain lasted 1 minute. Patient states he gets "chest tightness" every time he coughs but this morning the pain felt like "a cam truck running over my chest." States his chest pain is associated with tingling in his hands and "dizziness" which he described as a sensation of room spinning around him. States the "dizziness" is sometimes associated with the chest pain and sometimes when going from supine/ sitting to standing position, however, gets better when he sits down and rests. Also reports having  dyspnea with exertion. Denies having any fevers, chills, palpitations, or nausea. Denies having any GERD symptoms. Denies having any musculoskeletal injury to the chest area. Denies any history of recent travel and states he stays busy doing chores at home. He does have a history of asthma and states earlier he was using his inhaler every other day but now he has been using it up to 3 times a day. States he has been smoking one half pack per day of cigarettes since age of 48. Denies any drug or alcohol use. States his mother had congestive heart failure at the age of 8 and sister had a brain aneurysm at age 29.                                                                                                                   Meds: No current facility-administered medications for  this encounter.   Current Outpatient Prescriptions  Medication Sig Dispense Refill  . acetaminophen (TYLENOL) 325 MG tablet Take 2 tablets (650 mg total) by mouth every 6 (six) hours as needed for mild pain (or Fever >/= 101).    Marland Kitchen albuterol (PROVENTIL HFA;VENTOLIN HFA) 108 (90 BASE) MCG/ACT inhaler Inhale 2 puffs into the lungs every 6 (six) hours as needed for wheezing or shortness of breath.    . docusate sodium (COLACE) 100 MG capsule Take 200 mg by mouth daily as needed for mild constipation.    Marland Kitchen glipiZIDE (GLUCOTROL) 10 MG tablet Take 10 mg by mouth 2 (two) times daily.    Marland Kitchen ibuprofen (ADVIL,MOTRIN) 800 MG tablet Take 800 mg by mouth every 8 (eight) hours as needed for moderate pain.    Marland Kitchen lisinopril (PRINIVIL,ZESTRIL) 10 MG tablet Take 1 tablet by mouth daily.    . metFORMIN (GLUCOPHAGE) 1000 MG tablet Take 1,000 mg by mouth 2 (two) times daily with a meal.    . pregabalin (LYRICA) 75 MG capsule Take 75 mg by mouth 2 (two) times daily.    . metroNIDAZOLE (FLAGYL) 500 MG tablet Take 1 tablet (500 mg total) by mouth every 8 (eight) hours. Take for 9 days then stop. (Patient not taking: Reported on 09/24/2015) 27 tablet 0      Allergies: Allergies as of 09/24/2015  . (No Known Allergies)   Past Medical History  Diagnosis Date  . Asthma   . Diabetes mellitus without complication [facility])    Past Surgical History  Procedure Laterality Date  . Skin graft    . Appendectomy     History reviewed. No pertinent family history. Social History   Social History  . Marital Status: Single    Spouse Name: N/A  . Number of Children: N/A  . Years of Education: N/A   Occupational History  . Not on file.   Social History Main Topics  . Smoking status: Current Every Day Smoker -- 0.50 packs/day    Types: Cigarettes  . Smokeless tobacco: Not on file  . Alcohol Use: No  . Drug Use: No  . Sexual Activity: Not on file   Other Topics Concern  . Not on file   Social History Narrative    Review of Systems: General: Denies fever, chills, fatigue, change in appetite and diaphoresis.  Respiratory: Positive for SOB, cough, chest tightness, DOE, and wheezing.   Cardiovascular: Positive for chest pain. Negative for palpitations or leg swelling.   Gastrointestinal: Denies nausea, vomiting, abdominal pain. Genitourinary: Denies dysuria or difficulty with urination.  Musculoskeletal: Denies myalgias, back pain, joint swelling, or arthralgias. Skin: Denies pallor, rash and wounds.  Neurological: Positive for dizziness. Denies headaches, weakness, lightheadedness, numbness, seizures, or syncope. Psychiatric/Behavioral: Denies mood changes, confusion, nervousness, sleep disturbance, or agitation.  Physical Exam: Filed Vitals:   09/24/15 1030 09/24/15 1100 09/24/15 1130 09/24/15 1145  BP: 139/84 124/75 128/74 124/78  Pulse: 92 89 90 84  Temp:      TempSrc:      Resp: 19 23 15 22   Height:      Weight:      SpO2: 96% 97% 98% 100%   General: Vital signs reviewed.  Patient is well-developed and well-nourished, in no acute distress and cooperative with exam.  Head: Normocephalic and atraumatic. Eyes: EOMI,  conjunctivae normal, no scleral icterus.  Neck: Supple, trachea midline, normal ROM, no JVD, masses, thyromegaly, or carotid bruit present.  Cardiovascular: RRR, S1 normal, S2 normal, no murmurs, gallops,  or rubs. Pulmonary/Chest: Decreased breath sounds diffusely. End-expiratory wheezing in the upper lung fields. No rales or rhonchi. Abdominal: Soft, non-tender, non-distended, BS +. Musculoskeletal: No joint deformities, erythema, or stiffness, ROM full and nontender. Extremities: No lower extremity edema bilaterally,  pulses symmetric and intact bilaterally.  Neurological: A&O x3 Skin: Warm, dry and intact. No rashes or erythema. Psychiatric: Normal mood and affect. Speech and behavior is normal. Cognition and memory are normal.    Lab results: Basic Metabolic Panel:  Recent Labs  09/24/15 0858  NA 133*  K 4.8  CL 100*  CO2 24  GLUCOSE 265*  BUN 12  CREATININE 0.88  CALCIUM 9.4   Liver Function Tests:  Recent Labs  09/24/15 0858  AST 21  ALT 36  ALKPHOS 141*  BILITOT 0.5  PROT 7.8  ALBUMIN 3.4*   CBC:  Recent Labs  09/24/15 0858  WBC 5.3  NEUTROABS 3.1  HGB 13.0  HCT 39.1  MCV 83.7  PLT 284   Imaging results:  Dg Chest 2 View  09/24/2015  CLINICAL DATA:  Chest pain radiating to the right for 1 week EXAM: CHEST - 2 VIEW COMPARISON:  None. FINDINGS: The heart size and mediastinal contours are within normal limits. Both lungs are clear. The visualized skeletal structures are unremarkable. IMPRESSION: No active disease. Electronically Signed   By: Inez Catalina M.D.   On: 09/24/2015 08:56   Other results: EKG: TWI noted in leads aVL, V1 and V2. TWI in aVL and V1 are old, V2 is new.   Assessment & Plan by Problem: Active Problems:   Chest pain  Chest Pain:  Patient presented with complaint of chest pain - 7-8/ 10 intensity, intermittent, sharp, located substernally and radiates to the right side of his chest. Chest pain has been getting progressively worse  over time with each episode lasting for longer period of time. Patient describes chest pain earlier this morning as pressure-like. He has significant risk factors of HTN, smoking, and T2DM giving him heart score of at least 4 with moderate risk. Vital signs on admission were stable, but mildly hypertensive at 156/91, HR 92. BMET and CBC within normal limits. Normal liver enzymes. POC troponin negative. CXR was without active disease, without obvious cardiomegaly or mediastinal widening. EKG revealed TWI in aVL, V1 and V2. On comparison with previous EKG from November 2015, TWI in aVL and V1 are old, but V2 is new. Not likely PE; his Wells score is 0. Not likely aortic dissection as CXR is not showing mediastinal widening and blood pressure in both arms is equal (140s/ 80s). Not likely GERD because patient denies having any GERD symptoms. Not likely musculoskeletal pain because chest wall was nontender to palpation and patient denied having any history of musculoskeletal injury to the chest area. Patient does report having pleuritic cough and congestion for the past 1 month, however, chest pain is not likely due to pneumonia because he is afebrile and chest x-ray is normal. Patient was given morphine 4 mg injection in the ED. Patient has been given aspirin 325 mg and will be continued on aspirin 81 mg daily. He has been given nebulizer treatment as well because he reports having chest tightness and wheezing. We will speak to cardiology because his symptoms are concerning for possible unstable angina. -Admitted to telemetry -Heparin drip  -Aspirin 81 mg daily -Sublingual nitroglycerin 0.4 mg as needed -Zofran prn nausea -Follow-up a.m. EKG -Echo pending -Trend troponin 3 -Pending labs: BMP, CBC, A1c, HIV antibody,  lipid panel  T2DM: Last A1c 6.8 in 2015. Patient is on glipizide 10 mg BID and metformin 1000 mg BID at home.  -Sliding scale insulin-sensitive  Diabetic neuropathy -Continue Lyrica 75 mg 3  times a day  Asthma: Given albuterol nebulizer treatment. -Albuterol nebulizer q6 prn -Ipratropium nebulizer  Hypertension: Blood pressure stable at present 116/78. -cont home Lisinopril 10 mg daily  Tobacco use: He has been smoking one half pack per day since age of 89. -Nicotine patch 7 mg daily  Diet: Carb modified  DVT prophylaxis: Lovenox   Dispo: Disposition is deferred at this time, awaiting improvement of current medical problems. Anticipated discharge in approximately 1-2 day(s).   The patient does have a current PCP (Pcp Not In System) and does not need an [facility] hospital follow-up appointment after discharge.  The patient does not have transportation limitations that hinder transportation to clinic appointments.  Signed:

## 2015-09-24 NOTE — Progress Notes (Signed)
Patient having 7/10 left sided chest pain.  EKG obtained.  IV morphine given with reduction of pain to 5/10.  Dr. Marlowe Sax paged and notified.  Sanda Linger

## 2015-09-24 NOTE — ED Provider Notes (Signed)
CSN: [id number]     Arrival date & time 09/24/15  0741 History   First MD Initiated Contact with Patient 09/24/15 [id number]     Chief Complaint  Patient presents with  . Chest Pain     (Consider location/radiation/quality/duration/timing/severity/associated sxs/prior Treatment) HPI Comments: Patient with history of diabetes, hypertension, high cholesterol, smoking -- presents with complaint of acute onset of intermittent chest pains, described as sharp, in the mid chest with radiation to the right chest. Symptoms occur spontaneously and are not related to activity or cough. Patient has had a persistent nonproductive cough for the past several weeks as well. No hemoptysis or productive cough. Patient denies vomiting with the chest pain or diaphoresis. Pain was severe this morning, woke him from sleep which prompted emergency department visit. No lower extremity swelling or pain. Patient denies risk factors for pulmonary embolism including: unilateral leg swelling, history of DVT/PE/other blood clots, use of estrogens, recent immobilizations, recent surgery, recent travel (>4hr segment), malignancy, hemoptysis.     Patient is a 54 y.o. male presenting with chest pain. The history is provided by the patient and medical records.  Chest Pain Associated symptoms: cough   Associated symptoms: no abdominal pain, no back pain, no diaphoresis, no fever, no nausea, no palpitations, no shortness of breath and not vomiting     Past Medical History  Diagnosis Date  . Asthma   . Diabetes mellitus without complication [facility])    Past Surgical History  Procedure Laterality Date  . Skin graft    . Appendectomy     History reviewed. No pertinent family history. Social History  Substance Use Topics  . Smoking status: Current Every Day Smoker -- 0.50 packs/day    Types: Cigarettes  . Smokeless tobacco: None  . Alcohol Use: No    Review of Systems  Constitutional: Negative for fever and diaphoresis.   Eyes: Negative for redness.  Respiratory: Positive for cough. Negative for shortness of breath.   Cardiovascular: Positive for chest pain. Negative for palpitations and leg swelling.  Gastrointestinal: Negative for nausea, vomiting and abdominal pain.  Genitourinary: Negative for dysuria.  Musculoskeletal: Negative for back pain and neck pain.  Skin: Negative for rash.  Neurological: Negative for syncope and light-headedness.  Psychiatric/Behavioral: The patient is not nervous/anxious.       Allergies  Review of patient's allergies indicates no known allergies.  Home Medications   Prior to Admission medications   Medication Sig Start Date End Date Taking? Authorizing Provider  acetaminophen (TYLENOL) 325 MG tablet Take 2 tablets (650 mg total) by mouth every 6 (six) hours as needed for mild pain (or Fever >/= 101). 09/09/14   Eugenie Filler, MD  albuterol (PROVENTIL HFA;VENTOLIN HFA) 108 (90 BASE) MCG/ACT inhaler Inhale 2 puffs into the lungs every 6 (six) hours as needed for wheezing or shortness of breath.    Historical Provider, MD  docusate sodium (COLACE) 100 MG capsule Take 200 mg by mouth daily as needed for mild constipation.    Historical Provider, MD  glipiZIDE (GLUCOTROL) 10 MG tablet Take 10 mg by mouth 2 (two) times daily.    Historical Provider, MD  ibuprofen (ADVIL,MOTRIN) 800 MG tablet Take 800 mg by mouth every 8 (eight) hours as needed for moderate pain. 05/21/14   Dalia Heading, PA-C  lisinopril (PRINIVIL,ZESTRIL) 2.5 MG tablet Take 2.5 mg by mouth daily.    Historical Provider, MD  metFORMIN (GLUCOPHAGE) 1000 MG tablet Take 1,000 mg by mouth 2 (two) times daily with a  meal.    Historical Provider, MD  metroNIDAZOLE (FLAGYL) 500 MG tablet Take 1 tablet (500 mg total) by mouth every 8 (eight) hours. Take for 9 days then stop. 09/09/14   Eugenie Filler, MD  pregabalin (LYRICA) 75 MG capsule Take 75 mg by mouth 2 (two) times daily.    Historical Provider, MD    BP 156/91 mmHg  Pulse 92  Temp(Src) 98.7 F (37.1 C) (Oral)  Resp 17  Ht 6\' 3"  (1.905 m)  Wt 97.523 kg  BMI 26.87 kg/m2  SpO2 100%   Physical Exam  Constitutional: He appears well-developed and well-nourished.  HENT:  Head: Normocephalic and atraumatic.  Mouth/Throat: Oropharynx is clear and moist and mucous membranes are normal. Mucous membranes are not dry.  Eyes: Conjunctivae are normal.  Neck: Trachea normal and normal range of motion. Neck supple. Normal carotid pulses and no JVD present. No muscular tenderness present. Carotid bruit is not present. No tracheal deviation present.  Cardiovascular: Normal rate, regular rhythm, S1 normal, S2 normal, normal heart sounds and intact distal pulses.  Exam reveals no distant heart sounds and no decreased pulses.   No murmur heard. Pulmonary/Chest: Effort normal. No respiratory distress. He has wheezes (Moderate scattered expiratory wheezing). He exhibits no tenderness.  Abdominal: Soft. Normal aorta and bowel sounds are normal. There is no tenderness. There is no rebound and no guarding.  Musculoskeletal: He exhibits no edema.  Neurological: He is alert.  Skin: Skin is warm and dry. He is not diaphoretic. No cyanosis. No pallor.  Psychiatric: He has a normal mood and affect.  Nursing note and vitals reviewed.   ED Course  Procedures (including critical care time) Labs Review Labs Reviewed - No data to display  Imaging Review Dg Chest 2 View  09/24/2015  CLINICAL DATA:  Chest pain radiating to the right for 1 week EXAM: CHEST - 2 VIEW COMPARISON:  None. FINDINGS: The heart size and mediastinal contours are within normal limits. Both lungs are clear. The visualized skeletal structures are unremarkable. IMPRESSION: No active disease. Electronically Signed   By: Inez Catalina M.D.   On: 09/24/2015 08:56   I have personally reviewed and evaluated these images and lab results as part of my medical decision-making.   EKG  Interpretation   Date/Time:  Saturday September 24 2015 07:48:02 EST Ventricular Rate:  91 PR Interval:  184 QRS Duration: 79 QT Interval:  343 QTC Calculation: 422 R Axis:   24 Text Interpretation:  Sinus rhythm Probable left atrial enlargement  Anteroseptal infarct, age indeterminate Lateral leads are also involved No  significant change was found Confirmed by CAMPOS  MD, KEVIN ([id number]) on  09/24/2015 10:25:04 AM       8:02 AM Patient seen and examined. Work-up initiated. Medications ordered.   Vital signs reviewed and are as follows: BP 156/91 mmHg  Pulse 92  Temp(Src) 98.7 F (37.1 C) (Oral)  Resp 17  Ht 6\' 3"  (1.905 m)  Wt 97.523 kg  BMI 26.87 kg/m2  SpO2 100%  11:45 AM Patient stable. He did have an episode of constant pain here, described as pressure, improved with morphine. Discussed with Dr. Venora Maples. HEART score = 4. Will admit for CP observation/rule-out given risk factor profile. Patient agrees. Spoke with daughter by telephone as well.   OPC, Dr. Marvel Plan to admit.   MDM   Final diagnoses:  Chest pain, unspecified chest pain type   Admit.    Carlisle Cater, PA-C 09/24/15 [place], MD  09/24/15 1250 

## 2015-09-24 NOTE — Progress Notes (Signed)
Inspiratory and Expiratory wheezing noted, breathing tx given, and patient started on 2 liters of oxygen, stated he felt better after breathing tx

## 2015-09-24 NOTE — ED Notes (Signed)
Attempted report 

## 2015-09-24 NOTE — ED Notes (Signed)
Patient transported to X-ray 

## 2015-09-24 NOTE — ED Notes (Signed)
Pt states he has had a cold for 40month with coughing.  Pt states he has central chest pain radiating to the right chest that feels sharp and intermittent and/or when coughing 7/10 for 2 weeks.  Pt states he feels like he is having an asthma attack also.  SAT 100% on RA

## 2015-09-24 NOTE — Progress Notes (Signed)
ANTICOAGULATION CONSULT NOTE - Initial Consult  Pharmacy Consult for heparin Indication: chest pain/ACS  No Known Allergies  Patient Measurements: Height: 6\' 3"  (190.5 cm) Weight: 213 lb 4.8 oz (96.752 kg) IBW/kg (Calculated) : 84.5   Vital Signs: Temp: 98.2 F (36.8 C) (12/17 1335) Temp Source: Oral (12/17 1335) BP: 116/78 mmHg (12/17 1335) Pulse Rate: 85 (12/17 1245)  Labs:  Recent Labs  09/24/15 0858 09/24/15 1445  HGB 13.0  --   HCT 39.1  --   PLT 284  --   CREATININE 0.88  --   TROPONINI  --  <0.03    Estimated Creatinine Clearance: 112 mL/min (by C-G formula based on Cr of 0.88).   Medical History: Past Medical History  Diagnosis Date  . Asthma   . Diabetes mellitus without complication ([place])     Medications:  Prescriptions prior to admission  Medication Sig Dispense Refill Last Dose  . acetaminophen (TYLENOL) 325 MG tablet Take 2 tablets (650 mg total) by mouth every 6 (six) hours as needed for mild pain (or Fever >/= 101).     Marland Kitchen albuterol (PROVENTIL HFA;VENTOLIN HFA) 108 (90 BASE) MCG/ACT inhaler Inhale 2 puffs into the lungs every 6 (six) hours as needed for wheezing or shortness of breath.   09/23/2015 at Unknown time  . docusate sodium (COLACE) 100 MG capsule Take 200 mg by mouth daily as needed for mild constipation.   Past Month at Unknown time  . glipiZIDE (GLUCOTROL) 10 MG tablet Take 10 mg by mouth 2 (two) times daily.   09/23/2015 at Unknown time  . ibuprofen (ADVIL,MOTRIN) 800 MG tablet Take 800 mg by mouth every 8 (eight) hours as needed for moderate pain.   Past Month at Unknown time  . lisinopril (PRINIVIL,ZESTRIL) 10 MG tablet Take 1 tablet by mouth daily.   09/23/2015 at Unknown time  . metFORMIN (GLUCOPHAGE) 1000 MG tablet Take 1,000 mg by mouth 2 (two) times daily with a meal.   09/23/2015 at Unknown time  . pregabalin (LYRICA) 75 MG capsule Take 75 mg by mouth 2 (two) times daily.   09/23/2015 at Unknown time  . metroNIDAZOLE (FLAGYL)  500 MG tablet Take 1 tablet (500 mg total) by mouth every 8 (eight) hours. Take for 9 days then stop. (Patient not taking: Reported on 09/24/2015) 27 tablet 0 Completed Course at Unknown time   Scheduled:  . [START ON 09/25/2015] aspirin EC  81 mg Oral Daily  . insulin aspart  0-5 Units Subcutaneous QHS  . insulin aspart  0-9 Units Subcutaneous TID WC  . [START ON 09/25/2015] lisinopril  10 mg Oral Daily  . nicotine  7 mg Transdermal Daily  . pregabalin  75 mg Oral TID  . sodium chloride  3 mL Intravenous Q12H    Assessment: 54 yo male with CP to begin heparin for r/o ACS.   Goal of Therapy:  Heparin level 0.3-0.7 units/ml Monitor platelets by anticoagulation protocol: Yes   Plan:   -Heparin bolus 4000 units IV followed by  1350 units/hr (~ 14 units/kg/hr) -Heparin level in 6 hours and daily wth CBC daily  Hildred Laser, Pharm D 09/24/2015 4:33 PM

## 2015-09-24 NOTE — ED Notes (Signed)
Pt off unit with xray 

## 2015-09-24 NOTE — Progress Notes (Signed)
ANTICOAGULATION CONSULT NOTE - Follow Up Consult  Pharmacy Consult for heparin Indication: chest pain/ACS   Labs:  Recent Labs  09/24/15 0858 09/24/15 1445 09/24/15 2030 09/24/15 2307  HGB 13.0  --   --   --   HCT 39.1  --   --   --   PLT 284  --   --   --   HEPARINUNFRC  --   --   --  0.40  CREATININE 0.88  --   --   --   TROPONINI  --  <0.03 <0.03  --      Assessment/Plan:  54yo male therapeutic on heparin with initial dosing for CP. Will continue gtt at current rate and confirm stable with am labs.   Wynona Neat, PharmD, BCPS  09/24/2015,11:37 PM

## 2015-09-25 ENCOUNTER — Inpatient Hospital Stay (HOSPITAL_COMMUNITY): Payer: Medicare Other

## 2015-09-25 DIAGNOSIS — J4489 Other specified chronic obstructive pulmonary disease: Secondary | ICD-10-CM | POA: Diagnosis present

## 2015-09-25 DIAGNOSIS — Z720 Tobacco use: Secondary | ICD-10-CM

## 2015-09-25 DIAGNOSIS — E119 Type 2 diabetes mellitus without complications: Secondary | ICD-10-CM

## 2015-09-25 DIAGNOSIS — I10 Essential (primary) hypertension: Secondary | ICD-10-CM

## 2015-09-25 DIAGNOSIS — E1140 Type 2 diabetes mellitus with diabetic neuropathy, unspecified: Secondary | ICD-10-CM

## 2015-09-25 DIAGNOSIS — J4541 Moderate persistent asthma with (acute) exacerbation: Secondary | ICD-10-CM

## 2015-09-25 DIAGNOSIS — I200 Unstable angina: Secondary | ICD-10-CM

## 2015-09-25 DIAGNOSIS — Z7984 Long term (current) use of oral hypoglycemic drugs: Secondary | ICD-10-CM

## 2015-09-25 DIAGNOSIS — J45909 Unspecified asthma, uncomplicated: Secondary | ICD-10-CM | POA: Diagnosis present

## 2015-09-25 DIAGNOSIS — E785 Hyperlipidemia, unspecified: Secondary | ICD-10-CM

## 2015-09-25 DIAGNOSIS — Z79899 Other long term (current) drug therapy: Secondary | ICD-10-CM

## 2015-09-25 DIAGNOSIS — F17200 Nicotine dependence, unspecified, uncomplicated: Secondary | ICD-10-CM

## 2015-09-25 DIAGNOSIS — R079 Chest pain, unspecified: Secondary | ICD-10-CM

## 2015-09-25 DIAGNOSIS — J449 Chronic obstructive pulmonary disease, unspecified: Secondary | ICD-10-CM | POA: Diagnosis present

## 2015-09-25 LAB — BASIC METABOLIC PANEL
Anion gap: 7 (ref 5–15)
BUN: 11 mg/dL (ref 6–20)
CHLORIDE: 101 mmol/L (ref 101–111)
CO2: 26 mmol/L (ref 22–32)
Calcium: 9 mg/dL (ref 8.9–10.3)
Creatinine, Ser: 0.82 mg/dL (ref 0.61–1.24)
GFR calc Af Amer: >60 mL/min (ref >60)
GFR calc non Af Amer: >60 mL/min (ref >60)
Glucose, Bld: 187 mg/dL — ABNORMAL HIGH (ref 65–99)
POTASSIUM: 4.9 mmol/L (ref 3.5–5.1)
SODIUM: 134 mmol/L — AB (ref 135–145)

## 2015-09-25 LAB — CBC
HEMATOCRIT: 37.7 % — AB (ref 39.0–52.0)
Hemoglobin: 12.1 g/dL — ABNORMAL LOW (ref 13.0–17.0)
MCH: 27.4 pg (ref 26.0–34.0)
MCHC: 32.1 g/dL (ref 30.0–36.0)
MCV: 85.3 fL (ref 78.0–100.0)
Platelets: 257 10*3/uL (ref 150–400)
RBC: 4.42 MIL/uL (ref 4.22–5.81)
RDW: 13.3 % (ref 11.5–15.5)
WBC: 5.6 10*3/uL (ref 4.0–10.5)

## 2015-09-25 LAB — GLUCOSE, CAPILLARY
GLUCOSE-CAPILLARY: 182 mg/dL — AB (ref 65–99)
Glucose-Capillary: 178 mg/dL — ABNORMAL HIGH (ref 65–99)
Glucose-Capillary: 145 mg/dL — ABNORMAL HIGH (ref 65–99)
Glucose-Capillary: 165 mg/dL — ABNORMAL HIGH (ref 65–99)

## 2015-09-25 LAB — HEPARIN LEVEL (UNFRACTIONATED): Heparin Unfractionated: 0.47 IU/mL (ref 0.30–0.70)

## 2015-09-25 LAB — HIV ANTIBODY (ROUTINE TESTING W REFLEX): HIV SCREEN 4TH GENERATION: NONREACTIVE

## 2015-09-25 MED ORDER — SODIUM CHLORIDE 0.9 % IJ SOLN: 3.0000 mL | Freq: Two times a day (BID) | INTRAMUSCULAR | Status: DC

## 2015-09-25 MED ORDER — SODIUM CHLORIDE 0.9 % IV SOLN: 250.0000 mL | INTRAVENOUS | Status: DC | PRN

## 2015-09-25 MED ORDER — HYDROCOD POLST-CPM POLST ER 10-8 MG/5ML PO SUER
5.0000 mL | Freq: Two times a day (BID) | ORAL | Status: DC | PRN
  Administered 2015-09-25 – 2015-09-26 (×2): 5 mL via ORAL
  Filled 2015-09-25 (×2): qty 5

## 2015-09-25 MED ORDER — SODIUM CHLORIDE 0.9 % WEIGHT BASED INFUSION
3.0000 mL/kg/h | INTRAVENOUS | Status: DC
  Administered 2015-09-26: 3 mL/kg/h via INTRAVENOUS

## 2015-09-25 MED ORDER — MOMETASONE FURO-FORMOTEROL FUM 100-5 MCG/ACT IN AERO
2.0000 | INHALATION_SPRAY | Freq: Two times a day (BID) | RESPIRATORY_TRACT | Status: DC
  Administered 2015-09-25 – 2015-09-26 (×3): 2 via RESPIRATORY_TRACT
  Filled 2015-09-25 (×2): qty 8.8

## 2015-09-25 MED ORDER — SODIUM CHLORIDE 0.9 % WEIGHT BASED INFUSION: 1.0000 mL/kg/h | INTRAVENOUS | Status: DC

## 2015-09-25 MED ORDER — GUAIFENESIN ER 600 MG PO TB12
600.0000 mg | ORAL_TABLET | Freq: Two times a day (BID) | ORAL | Status: DC
  Administered 2015-09-25 – 2015-09-26 (×3): 600 mg via ORAL
  Filled 2015-09-25 (×3): qty 1

## 2015-09-25 MED ORDER — SODIUM CHLORIDE 0.9 % IJ SOLN: 3.0000 mL | INTRAMUSCULAR | Status: DC | PRN

## 2015-09-25 MED ORDER — ASPIRIN 81 MG PO CHEW
81.0000 mg | CHEWABLE_TABLET | ORAL | Status: AC
  Administered 2015-09-26: 81 mg via ORAL
  Filled 2015-09-25: qty 1

## 2015-09-25 NOTE — Progress Notes (Signed)
Pt awakened by phlebotomy for am labs.  Pt reports 5/10 chest tightness mid chest on awakening, vital signs stable with oxygen sat 100 % on 2L per [place], lungs with expiratory wheezes in upper lobes.  Pt states pain is worsened with cough.  AM EKG obtained with no acute changes noted.  1 SL ntg given and albuterol neb per prn orders with relief.

## 2015-09-25 NOTE — Consult Note (Signed)
CARDIOLOGY CONSULT NOTE  Patient ID: Richard [name] MRN: [id number] DOB/AGE: 56-Sep-1962 56 y.o.  Admit date: 09/24/2015 Primary Physician Pcp Not In System Consulting physician: Marvel Plan  Reason for Consultation: chest pain/shortness of breath  HPI: The patient is a 54 yr old male with a h/o type 2 diabetes, hypertension, and tobacco abuse. For the past 3 weeks, he has been having intermittent episodes of retrosternal chest pain occasionally radiating to the right axilla and sometimes associated with right finger numbness. He has had one episode accompanied by dizziness but denies syncope. They have been occurring more frequently, made worse with exertion. Says he's also had a cold for the past month with non-productive cough. At 5 am yesterday, he was awoken by severe chest pain. He has had some episodes while hospitalized, alleviated with SL nitroglycerin and morphine. ECG on admission was without ischemic ST-T abnormalities. Troponins have been normal. Due to concerns for unstable angina, he has been started on heparin and ASA.  Soc: Smokes 0.5 ppd since age 69.    No Known Allergies  Current Facility-Administered Medications  Medication Dose Route Frequency Provider Last Rate Last Dose  . acetaminophen (TYLENOL) tablet 650 mg  650 mg Oral Q6H PRN Alexa Sherral Hammers, MD       Or  . acetaminophen (TYLENOL) suppository 650 mg  650 mg Rectal Q6H PRN Alexa Sherral Hammers, MD      . albuterol (PROVENTIL) (2.5 MG/3ML) 0.083% nebulizer solution 2.5 mg  2.5 mg Nebulization Q6H PRN Alexa Sherral Hammers, MD   2.5 mg at 09/25/15 0417  . aspirin EC tablet 81 mg  81 mg Oral Daily Alexa Sherral Hammers, MD   81 mg at 09/25/15 1043  . chlorpheniramine-HYDROcodone (TUSSIONEX) 10-8 MG/5ML suspension 5 mL  5 mL Oral Q12H PRN Alexa Sherral Hammers, MD      . guaiFENesin (MUCINEX) 12 hr tablet 600 mg  600 mg Oral BID Alexa Sherral Hammers, MD   600 mg at 09/25/15 1043  . heparin ADULT infusion 100  units/mL (25000 units/250 mL)  1,350 Units/hr Intravenous Continuous Kris Mouton, RPH 13.5 mL/hr at 09/25/15 0848 1,350 Units/hr at 09/25/15 0848  . insulin aspart (novoLOG) injection 0-5 Units  0-5 Units Subcutaneous QHS Alexa Sherral Hammers, MD      . insulin aspart (novoLOG) injection 0-9 Units  0-9 Units Subcutaneous TID WC Alexa Sherral Hammers, MD   1 Units at 09/25/15 1254  . lisinopril (PRINIVIL,ZESTRIL) tablet 10 mg  10 mg Oral Daily Alexa Sherral Hammers, MD   10 mg at 09/25/15 1043  . LORazepam (ATIVAN) injection 1 mg  1 mg Intravenous Once PRN Alexa Sherral Hammers, MD      . mometasone-formoterol [facility]) 100-5 MCG/ACT inhaler 2 puff  2 puff Inhalation BID Alexa Sherral Hammers, MD   2 puff at 09/25/15 1218  . morphine 2 MG/ML injection 2 mg  2 mg Intravenous Q4H PRN Alexa Sherral Hammers, MD      . nicotine (NICODERM CQ - dosed in mg/24 hr) patch 7 mg  7 mg Transdermal Daily Alexa Sherral Hammers, MD   7 mg at 09/25/15 1043  . nitroGLYCERIN (NITROSTAT) SL tablet 0.4 mg  0.4 mg Sublingual Q5 min PRN Alexa Sherral Hammers, MD   0.4 mg at 09/25/15 0416  . ondansetron (ZOFRAN) tablet 4 mg  4 mg Oral Q6H PRN Alexa Sherral Hammers, MD       Or  . ondansetron [facility]) injection 4 mg  4 mg  Intravenous Q6H PRN Alexa Sherral Hammers, MD      . pregabalin (LYRICA) capsule 75 mg  75 mg Oral TID Alexa Sherral Hammers, MD   75 mg at 09/25/15 1043  . sodium chloride 0.9 % injection 3 mL  3 mL Intravenous Q12H Alexa Sherral Hammers, MD        Past Medical History  Diagnosis Date  . Asthma   . Diabetes mellitus without complication [facility])     Past Surgical History  Procedure Laterality Date  . Skin graft    . Appendectomy      Social History   Social History  . Marital Status: Single    Spouse Name: N/A  . Number of Children: N/A  . Years of Education: N/A   Occupational History  . Not on file.   Social History Main Topics  . Smoking status: Current Every Day Smoker -- 0.50 packs/day    Types: Cigarettes  .  Smokeless tobacco: Not on file  . Alcohol Use: No  . Drug Use: No  . Sexual Activity: Not on file   Other Topics Concern  . Not on file   Social History Narrative     No family history of premature CAD in 1st degree relatives.  Prior to Admission medications   Medication Sig Start Date End Date Taking? Authorizing Provider  acetaminophen (TYLENOL) 325 MG tablet Take 2 tablets (650 mg total) by mouth every 6 (six) hours as needed for mild pain (or Fever >/= 101). 09/09/14  Yes Eugenie Filler, MD  albuterol (PROVENTIL HFA;VENTOLIN HFA) 108 (90 BASE) MCG/ACT inhaler Inhale 2 puffs into the lungs every 6 (six) hours as needed for wheezing or shortness of breath.   Yes Historical Provider, MD  docusate sodium (COLACE) 100 MG capsule Take 200 mg by mouth daily as needed for mild constipation.   Yes Historical Provider, MD  glipiZIDE (GLUCOTROL) 10 MG tablet Take 10 mg by mouth 2 (two) times daily.   Yes Historical Provider, MD  ibuprofen (ADVIL,MOTRIN) 800 MG tablet Take 800 mg by mouth every 8 (eight) hours as needed for moderate pain. 05/21/14  Yes Christopher Lawyer, PA-C  lisinopril (PRINIVIL,ZESTRIL) 10 MG tablet Take 1 tablet by mouth daily. 06/17/15  Yes Historical Provider, MD  metFORMIN (GLUCOPHAGE) 1000 MG tablet Take 1,000 mg by mouth 2 (two) times daily with a meal.   Yes Historical Provider, MD  pregabalin (LYRICA) 75 MG capsule Take 75 mg by mouth 2 (two) times daily.   Yes Historical Provider, MD  metroNIDAZOLE (FLAGYL) 500 MG tablet Take 1 tablet (500 mg total) by mouth every 8 (eight) hours. Take for 9 days then stop. Patient not taking: Reported on 09/24/2015 09/09/14   Eugenie Filler, MD     Review of systems complete and found to be negative unless listed above in HPI     Physical exam Blood pressure 122/72, pulse 88, temperature 98.7 F (37.1 C), temperature source Oral, resp. rate 18, height 6\' 3"  (1.905 m), weight 210 lb 6.4 oz (95.437 kg), SpO2 99 %. General:  NAD Neck: No JVD, no thyromegaly or thyroid nodule.  Lungs: Diminished throughout, no rales CV: Nondisplaced PMI. Regular rate and rhythm, normal S1/S2, no S3/S4, no murmur.  No peripheral edema.  No carotid bruit.   Abdomen: Soft, nontender, no distention.  Skin: Intact without lesions or rashes.  Neurologic: Alert and oriented x 3.  Psych: Normal affect. Extremities: No clubbing or cyanosis.  HEENT: Normal.   ECG: Most  recent ECG reviewed.  Labs:   Lab Results  Component Value Date   WBC 5.6 09/25/2015   HGB 12.1* 09/25/2015   HCT 37.7* 09/25/2015   MCV 85.3 09/25/2015   PLT 257 09/25/2015    Recent Labs Lab 09/24/15 0858 09/25/15 0350  NA 133* 134*  K 4.8 4.9  CL 100* 101  CO2 24 26  BUN 12 11  CREATININE 0.88 0.82  CALCIUM 9.4 9.0  PROT 7.8  --   BILITOT 0.5  --   ALKPHOS 141*  --   ALT 36  --   AST 21  --   GLUCOSE 265* 187*   Lab Results  Component Value Date   TROPONINI <0.03 09/24/2015    Lab Results  Component Value Date   CHOL 195 09/24/2015   Lab Results  Component Value Date   HDL 44 09/24/2015   Lab Results  Component Value Date   LDLCALC 113* 09/24/2015   Lab Results  Component Value Date   TRIG 188* 09/24/2015   Lab Results  Component Value Date   CHOLHDL 4.4 09/24/2015   No results found for: LDLDIRECT       Studies: Dg Chest 2 View  09/24/2015  CLINICAL DATA:  Chest pain radiating to the right for 1 week EXAM: CHEST - 2 VIEW COMPARISON:  None. FINDINGS: The heart size and mediastinal contours are within normal limits. Both lungs are clear. The visualized skeletal structures are unremarkable. IMPRESSION: No active disease. Electronically Signed   By: Inez Catalina M.D.   On: 09/24/2015 08:56    ASSESSMENT AND PLAN:  1. Chest pain: Multiple risk factors for CAD. Symptoms concerning for unstable angina. On heparin and ASA. Will add statin therapy as his 10 yr risk for ASCVD is 31.4% (Lipitor 40 mg). Already on lisinopril. Will  hold off on beta blocker for now given COPD and recent and frequent wheezing episodes. Will arrange for coronary angiography on 12/19 to definitively exclude CAD. Echocardiogram performed, will follow up.  2. Essential HTN: Controlled on lisinopril. No changes.  3. Hyperlipidemia: LDL 113. Given concomitant diabetes, smoking, and HTN, he warrants high-intensity statin therapy. Will add Lipitor 40 mg.  4. Tobacco abuse: Wearing patch. Wants to quit.  5. Type 2 DM: On insulin while hospitalized.   Signed: Kate Sable, M.D., F.A.C.C.  09/25/2015, 2:44 PM

## 2015-09-25 NOTE — H&P (Signed)
Internal Medicine Attending Admission Note Date: 09/25/2015  Patient name: Richard [name] Medical record number: [id number] Date of birth: 08-Sep-1961 Age: 54 y.o. Gender: male  I saw and evaluated the patient. I reviewed the resident's note and I agree with the resident's findings and plan as documented in the resident's note.  Chief Complaint(s): Shortness of breath, cough, chest tightness 1 week.  History - key components related to admission:  Richard [name] is a 54 year old man with a history of type 2 diabetes, hypertension, and mild intermittent asthma who presents to the emergency department complaining of chest pain for the last 2 weeks which has progressively worsened during that time. The pain is substernal and sharp in character. It radiates to the right side of his chest and is occasionally associated with shortness of breath. It is inconsistently associated with exertion. There are no associated symptoms of nausea, dizziness, or diaphoresis. In addition to the shortness of breath at rest, on occasion with the chest tightness he also notes progressive dyspnea on exertion and cough. Of note, he is had to use his albuterol inhaler on a daily basis which is a progression from his usual use of the rescue inhaler. As the chest pain has worsened he presented to the emergency department and was admitted to the internal medicine teaching service for further evaluation and care. When asked about stresses at home he notes that his granddaughter, whom he is close to, ran away from home. Fortunately she has returned but is now having difficulty in school. She is only 56 years old. This has been causing considerable anxiety and stress for Richard [name].  Physical Exam - key components related to admission:  Filed Vitals:   09/24/15 1335 09/24/15 1950 09/25/15 0405 09/25/15 1414  BP: 116/78 119/74 125/76 122/72  Pulse:  89  88  Temp: 98.2 F (36.8 C) 98.9 F (37.2 C) 97.9 F (36.6 C) 98.7 F (37.1 C)   TempSrc: Oral  Oral Oral  Resp: 17 20 16 18   Height:      Weight:   210 lb 6.4 oz (95.437 kg)   SpO2: 95% 98% 100% 99%   Gen.: Well-developed, well-nourished, anxious man sitting comfortably in bed in no acute distress. Lungs: Poor air movement but no wheezes or prolonged expiratory phase. Heart: Regular rate and rhythm without murmurs, rubs, or gallops. Abdomen: Soft, nontender, active bowel sounds. Extremities: Without edema.  Lab results:  Basic Metabolic Panel:  Recent Labs  09/24/15 0858 09/25/15 0350  NA 133* 134*  K 4.8 4.9  CL 100* 101  CO2 24 26  GLUCOSE 265* 187*  BUN 12 11  CREATININE 0.88 0.82  CALCIUM 9.4 9.0   Liver Function Tests:  Recent Labs  09/24/15 0858  AST 21  ALT 36  ALKPHOS 141*  BILITOT 0.5  PROT 7.8  ALBUMIN 3.4*   CBC:  Recent Labs  09/24/15 0858 09/25/15 0350  WBC 5.3 5.6  NEUTROABS 3.1  --   HGB 13.0 12.1*  HCT 39.1 37.7*  MCV 83.7 85.3  PLT 284 257   Cardiac Enzymes:  Recent Labs  09/24/15 1445 09/24/15 2030  TROPONINI <0.03 <0.03   CBG:  Recent Labs  09/24/15 1336 09/24/15 1653 09/24/15 2117 09/25/15 0710 09/25/15 1113  GLUCAP 230* 268* 137* 178* 145*   Fasting Lipid Panel:  Recent Labs  09/24/15 1445  CHOL 195  HDL 44  LDLCALC 113*  TRIG 188*  CHOLHDL 4.4   Misc. Labs:  HIV negative  Imaging results:  Dg Chest 2 View  09/24/2015  CLINICAL DATA:  Chest pain radiating to the right for 1 week EXAM: CHEST - 2 VIEW COMPARISON:  None. FINDINGS: The heart size and mediastinal contours are within normal limits. Both lungs are clear. The visualized skeletal structures are unremarkable. IMPRESSION: No active disease. Electronically Signed   By: Inez Catalina M.D.   On: 09/24/2015 08:56   Other results:  EKG: Normal sinus rhythm at 91 bpm, normal axis, normal intervals, Q waves in V1 and V2, no LVH by voltage, good R wave progression, isolated T-wave inversion in a strain pattern in aVL, unchanged  from the previous ECG on 09/06/2014.  Assessment & Plan by Problem:  Richard [name] is a 54 year old man with a history of type 2 diabetes, hypertension, and asthma who presents with a chief complaint of worsening chest pain, dyspnea on exertion, and cough. He has both atypical and typical features of his chest pain with regards to the possible etiology of a cardiac source. Unfortunately, he does have cardiac risk factors including hypertension, diabetes, and tobacco abuse. Thus, despite some of the atypical features of his chest pain, reversible ischemia cannot be ruled out clinically with his evaluation thus far. He also had mild intermittent asthma which seems to have progressed to moderate persistent asthma. This has been inadequately controlled with his previous regimen of as needed albuterol and may be contributing to his symptoms.  1) Unstable angina: He has ruled out for myocardial infarction with serial enzymes and ECGs. We will continue the IV heparin drip and aspirin therapy. Chest pain will be treated with sublingual nitroglycerin as needed. An echocardiogram was performed and is pending at the time of this dictation. As we cannot completely rule out the possibility of reversible ischemia we decided to consult cardiology to ask if he is a candidate for a Myoview to further assess. In the meantime, we will continue with telemetric monitoring.  2) Moderate persistent asthma: Given his asthma is has worsened, likely related to a viral upper respiratory tract infection, we will initiate appropriate therapy which includes a combination inhaled steroid and long-acting beta agonist and as needed short acting beta agonist. We will reassess his symptomatology on this stepped up regimen.  3) Disposition: If he is a candidate for a Myoview and this is negative he can be discharged soon thereafter. If he is a candidate for a Myoview and it is abnormal further evaluation will be necessary. If he is not a  candidate for a Myoview he can be safely discharged today with follow-up in his primary care provider's office.

## 2015-09-25 NOTE — Progress Notes (Signed)
Subjective:  Patient was seen and examined this morning. Overnight, he had recurrent pressure like chest pain substernal with radiation to the right. He feels his main issue at this point is shortness of breath. The breathing treatments, morphine, and nitroglycerin have helped. He is anxious that this pain is from his heart. Patient also admits to increased anxiety over social situations with his granddaughter who recently ran away from home, but has returned.   As before, pain does not radiate to the left, neck or back. He denies associated nausea, vomiting or diaphoresis, but does have shortness of breath. Pain is worse with cough, but can occur at rest without any provoking cough.   Objective: Vital signs in last 24 hours: Filed Vitals:   09/24/15 1300 09/24/15 1335 09/24/15 1950 09/25/15 0405  BP:  116/78 119/74 125/76  Pulse:   89   Temp:  98.2 F (36.8 C) 98.9 F (37.2 C) 97.9 F (36.6 C)  TempSrc:  Oral  Oral  Resp:  17 20 16   Height: 6\' 3"  (1.905 m)     Weight: 213 lb 4.8 oz (96.752 kg)   210 lb 6.4 oz (95.437 kg)  SpO2:  95% 98% 100%   Weight change:   Intake/Output Summary (Last 24 hours) at 09/25/15 1016 Last data filed at 09/25/15 0500  Gross per 24 hour  Intake 864.46 ml  Output      0 ml  Net 864.46 ml   Filed Vitals:   09/24/15 1300 09/24/15 1335 09/24/15 1950 09/25/15 0405  BP:  116/78 119/74 125/76  Pulse:   89   Temp:  98.2 F (36.8 C) 98.9 F (37.2 C) 97.9 F (36.6 C)  TempSrc:  Oral  Oral  Resp:  17 20 16   Height: 6\' 3"  (1.905 m)     Weight: 213 lb 4.8 oz (96.752 kg)   210 lb 6.4 oz (95.437 kg)  SpO2:  95% 98% 100%   General: Vital signs reviewed.  Patient is well-developed and well-nourished, in no acute distress and cooperative with exam.  Cardiovascular: RRR, S1 normal, S2 normal, no murmurs, gallops, or rubs. Pulmonary/Chest: Decreased breath sounds, no wheezes, rales, or rhonchi. Abdominal: Soft, non-tender, non-distended, BS  + Extremities: No lower extremity edema bilaterally Skin: Warm, dry and intact.  Psychiatric: Speech and behavior is normal.    Lab Results: Basic Metabolic Panel:  Recent Labs Lab 09/24/15 0858 09/25/15 0350  NA 133* 134*  K 4.8 4.9  CL 100* 101  CO2 24 26  GLUCOSE 265* 187*  BUN 12 11  CREATININE 0.88 0.82  CALCIUM 9.4 9.0   Liver Function Tests:  Recent Labs Lab 09/24/15 0858  AST 21  ALT 36  ALKPHOS 141*  BILITOT 0.5  PROT 7.8  ALBUMIN 3.4*   CBC:  Recent Labs Lab 09/24/15 0858 09/25/15 0350  WBC 5.3 5.6  NEUTROABS 3.1  --   HGB 13.0 12.1*  HCT 39.1 37.7*  MCV 83.7 85.3  PLT 284 257   Cardiac Enzymes:  Recent Labs Lab 09/24/15 1445 09/24/15 2030  TROPONINI <0.03 <0.03   CBG:  Recent Labs Lab 09/24/15 1336 09/24/15 1653 09/24/15 2117 09/25/15 0710  GLUCAP 230* 268* 137* 178*   Fasting Lipid Panel:  Recent Labs Lab 09/24/15 1445  CHOL 195  HDL 44  LDLCALC 113*  TRIG 188*  CHOLHDL 4.4   Studies/Results: Dg Chest 2 View  09/24/2015  CLINICAL DATA:  Chest pain radiating to the right for 1 week EXAM: CHEST -  2 VIEW COMPARISON:  None. FINDINGS: The heart size and mediastinal contours are within normal limits. Both lungs are clear. The visualized skeletal structures are unremarkable. IMPRESSION: No active disease. Electronically Signed   By: Inez Catalina M.D.   On: 09/24/2015 08:56   Medications:  I have reviewed the patient's current medications. Prior to Admission:  Prescriptions prior to admission  Medication Sig Dispense Refill Last Dose  . acetaminophen (TYLENOL) 325 MG tablet Take 2 tablets (650 mg total) by mouth every 6 (six) hours as needed for mild pain (or Fever >/= 101).     Marland Kitchen albuterol (PROVENTIL HFA;VENTOLIN HFA) 108 (90 BASE) MCG/ACT inhaler Inhale 2 puffs into the lungs every 6 (six) hours as needed for wheezing or shortness of breath.   09/23/2015 at Unknown time  . docusate sodium (COLACE) 100 MG capsule Take 200 mg  by mouth daily as needed for mild constipation.   Past Month at Unknown time  . glipiZIDE (GLUCOTROL) 10 MG tablet Take 10 mg by mouth 2 (two) times daily.   09/23/2015 at Unknown time  . ibuprofen (ADVIL,MOTRIN) 800 MG tablet Take 800 mg by mouth every 8 (eight) hours as needed for moderate pain.   Past Month at Unknown time  . lisinopril (PRINIVIL,ZESTRIL) 10 MG tablet Take 1 tablet by mouth daily.   09/23/2015 at Unknown time  . metFORMIN (GLUCOPHAGE) 1000 MG tablet Take 1,000 mg by mouth 2 (two) times daily with a meal.   09/23/2015 at Unknown time  . pregabalin (LYRICA) 75 MG capsule Take 75 mg by mouth 2 (two) times daily.   09/23/2015 at Unknown time  . metroNIDAZOLE (FLAGYL) 500 MG tablet Take 1 tablet (500 mg total) by mouth every 8 (eight) hours. Take for 9 days then stop. (Patient not taking: Reported on 09/24/2015) 27 tablet 0 Completed Course at Unknown time   Scheduled Meds: . aspirin EC  81 mg Oral Daily  . guaiFENesin  600 mg Oral BID  . insulin aspart  0-5 Units Subcutaneous QHS  . insulin aspart  0-9 Units Subcutaneous TID WC  . lisinopril  10 mg Oral Daily  . mometasone-formoterol  2 puff Inhalation BID  . nicotine  7 mg Transdermal Daily  . pregabalin  75 mg Oral TID  . sodium chloride  3 mL Intravenous Q12H   Continuous Infusions: . heparin 1,350 Units/hr (09/25/15 0848)   PRN Meds:.acetaminophen **OR** acetaminophen, albuterol, chlorpheniramine-HYDROcodone, LORazepam, morphine injection, nitroGLYCERIN, ondansetron **OR** ondansetron (ZOFRAN) IV Assessment/Plan: Active Problems:   Chest pain  Unstable Angina:Patient presented with complaint of substernal chest pressure with radiation to the right side of his chest that was intermittent, but worsening in intensity and in frequency over the last one month. EKG was without acute ischemic changes (old TWI in aVL, V1) and troponins negative x 3. Given his history of what sounds like stable angina (DOE and chest pain with  exertion) and his significant risk factors of HTN, HLD, Tobacco Abuse and T2DM (Heart Score 4+), I am concerned for unstable angina. I have discussed this patient's case with Cardiology who has kindly agreed to see him for consideration of myoview.  -Cardiology consulted, appreciate recommendations -Telemetry -Heparin gtt -Aspirin 81 mg daily -Morphine 2 mg Q4H prn -Nitroglycerin SL prn  -Zofran prn nausea -Echo pending  Moderate Persistent Asthma: Patient admits to using his home albuterol inhaler multiple times per day for his asthma symptoms. He was previously only on an albuterol inhaler at home for his asthma. Decreased breath sounds  without wheezing heard on examination today. Patient also admits to recent increased cough and URI symptoms. If cardiac cause of chest pain is ruled out, this may be the cause of his chest pain.  -Albuterol nebulizer Q6H prn -Start LABA+ICS: Dulera (mometasone-formoterol) 100-5 mcg/act 2 puffs BID -Mucinex 600 mg BID -Tussionex Q12H prn   T2DM with Neuropathy: Last A1c 6.8 in 2015. Patient is on glipizide 10 mg BID, metformin 1000 mg BID, and Lyrica 75 mg at home.  -SSI-S -HgbA1c pending -Continue home Lyrica 75 mg TID -Carb Modified Diet  Hypertension: Blood pressure stable at present 116/78. -cont home Lisinopril 10 mg daily  Tobacco Abuse: 20+ pack year history.  -Nicotine patch 7 mg daily  Diet: Carb modified DVT/PE ppx: Heparin gtt  Dispo: Disposition is deferred at this time, awaiting improvement of current medical problems.  Anticipated discharge in approximately 1-2 day(s).   The patient does have a current PCP (Pcp Not In System) and does not need an [facility] hospital follow-up appointment after discharge.  The patient does not have transportation limitations that hinder transportation to clinic appointments.   LOS: 1 day   Osa Craver, DO PGY-2 Internal Medicine Resident Pager # [phone] 09/25/2015 10:16 AM

## 2015-09-25 NOTE — Care Management Note (Signed)
Case Management Note  Patient Details  Name: Richard [name] MRN: [id number] Date of Birth: [birth year]  Subjective/Objective:                  Chest pain  Action/Plan: CM spoke with patient at the bedside. Provided patient with the [facility] contact information for assistance with locating a PCP. Also discussed calling the telephone number on his insurance card for a list of in-network PCP's.   Expected Discharge Date:   09/26/15              Expected Discharge Plan:  Home/Self Care  In-House Referral:     Discharge planning Services  CM Consult  Post Acute Care Choice:  NA Choice offered to:  NA  DME Arranged:  N/A DME Agency:     HH Arranged:  NA HH Agency:     Status of Service:  In process, will continue to follow  Medicare Important Message Given:    Date Medicare IM Given:    Medicare IM give by:    Date Additional Medicare IM Given:    Additional Medicare Important Message give by:     If discussed at [place] of Stay Meetings, dates discussed:    Additional Comments:  Apolonio Schneiders, RN 09/25/2015, 10:16 AM

## 2015-09-25 NOTE — Progress Notes (Signed)
ANTICOAGULATION CONSULT NOTE - Initial Consult  Pharmacy Consult for heparin Indication: chest pain/ACS  No Known Allergies  Patient Measurements: Height: 6\' 3"  (190.5 cm) Weight: 210 lb 6.4 oz (95.437 kg) IBW/kg (Calculated) : 84.5   Vital Signs: Temp: 97.9 F (36.6 C) (12/18 0405) Temp Source: Oral (12/18 0405) BP: 125/76 mmHg (12/18 0405)  Labs:  Recent Labs  09/24/15 0858 09/24/15 1445 09/24/15 2030 09/24/15 2307 09/25/15 0350  HGB 13.0  --   --   --  12.1*  HCT 39.1  --   --   --  37.7*  PLT 284  --   --   --  257  HEPARINUNFRC  --   --   --  0.40 0.47  CREATININE 0.88  --   --   --  0.82  TROPONINI  --  <0.03 <0.03  --   --     Assessment: 53 yo male with CP to begin heparin for r/o ACS. HL therapeutic on 1350 units/hr.   Goal of Therapy:  Heparin level 0.3-0.7 units/ml Monitor platelets by anticoagulation protocol: Yes   Plan:   - Continue heparin gtt at 1350 units/hr (~14 units/kg/hr) - Heparin level daily wth CBC daily - Monitor for s/sx bleeding  Joya San, PharmD Clinical Pharmacy Resident Pager # [phone] 09/25/2015 10:07 AM

## 2015-09-25 NOTE — Progress Notes (Signed)
  Echocardiogram 2D Echocardiogram has been performed.  Johny Chess 09/25/2015, 1:40 PM

## 2015-09-26 ENCOUNTER — Encounter (HOSPITAL_COMMUNITY)
Admission: EM | Disposition: A | Discharge status: Home / Self Care | Payer: Self-pay | Source: Home / Self Care | Attending: Internal Medicine

## 2015-09-26 DIAGNOSIS — R079 Chest pain, unspecified: Secondary | ICD-10-CM | POA: Diagnosis present

## 2015-09-26 DIAGNOSIS — R0789 Other chest pain: Principal | ICD-10-CM

## 2015-09-26 DIAGNOSIS — F17210 Nicotine dependence, cigarettes, uncomplicated: Secondary | ICD-10-CM

## 2015-09-26 HISTORY — PX: CARDIAC CATHETERIZATION: SHX172

## 2015-09-26 LAB — BASIC METABOLIC PANEL
ANION GAP: 6 (ref 5–15)
BUN: 13 mg/dL (ref 6–20)
CHLORIDE: 99 mmol/L — AB (ref 101–111)
CO2: 28 mmol/L (ref 22–32)
CREATININE: 0.9 mg/dL (ref 0.61–1.24)
Calcium: 9.1 mg/dL (ref 8.9–10.3)
GFR calc non Af Amer: >60 mL/min (ref >60)
Glucose, Bld: 196 mg/dL — ABNORMAL HIGH (ref 65–99)
Potassium: 4.7 mmol/L (ref 3.5–5.1)
SODIUM: 133 mmol/L — AB (ref 135–145)

## 2015-09-26 LAB — CBC
HCT: 38 % — ABNORMAL LOW (ref 39.0–52.0)
Hemoglobin: 12 g/dL — ABNORMAL LOW (ref 13.0–17.0)
MCH: 27 pg (ref 26.0–34.0)
MCHC: 31.6 g/dL (ref 30.0–36.0)
MCV: 85.4 fL (ref 78.0–100.0)
Platelets: 282 10*3/uL (ref 150–400)
RBC: 4.45 MIL/uL (ref 4.22–5.81)
RDW: 13.1 % (ref 11.5–15.5)
WBC: 4.7 10*3/uL (ref 4.0–10.5)

## 2015-09-26 LAB — HEPARIN LEVEL (UNFRACTIONATED): Heparin Unfractionated: 0.43 IU/mL (ref 0.30–0.70)

## 2015-09-26 LAB — GLUCOSE, CAPILLARY
GLUCOSE-CAPILLARY: 181 mg/dL — AB (ref 65–99)
GLUCOSE-CAPILLARY: 120 mg/dL — AB (ref 65–99)
Glucose-Capillary: 151 mg/dL — ABNORMAL HIGH (ref 65–99)

## 2015-09-26 LAB — HEMOGLOBIN A1C
Hgb A1c MFr Bld: 10.9 % — ABNORMAL HIGH (ref 4.8–5.6)
Mean Plasma Glucose: 266 mg/dL

## 2015-09-26 LAB — PROTIME-INR
INR: 1.07 (ref 0.00–1.49)
PROTHROMBIN TIME: 14.1 s (ref 11.6–15.2)

## 2015-09-26 LAB — MAGNESIUM: MAGNESIUM: 1.9 mg/dL (ref 1.7–2.4)

## 2015-09-26 SURGERY — LEFT HEART CATH AND CORONARY ANGIOGRAPHY
Anesthesia: LOCAL

## 2015-09-26 MED ORDER — MIDAZOLAM HCL 2 MG/2ML IJ SOLN
INTRAMUSCULAR | Status: AC
  Filled 2015-09-26: qty 2

## 2015-09-26 MED ORDER — LIDOCAINE HCL (PF) 1 % IJ SOLN
INTRAMUSCULAR | Status: AC
  Filled 2015-09-26: qty 30

## 2015-09-26 MED ORDER — VERAPAMIL HCL 2.5 MG/ML IV SOLN
INTRAVENOUS | Status: AC
  Filled 2015-09-26: qty 2

## 2015-09-26 MED ORDER — ASPIRIN 81 MG PO TBEC: 81.0000 mg | DELAYED_RELEASE_TABLET | Freq: Every day | ORAL | Status: DC

## 2015-09-26 MED ORDER — SODIUM CHLORIDE 0.9 % IV SOLN: 250.0000 mL | INTRAVENOUS | Status: DC | PRN

## 2015-09-26 MED ORDER — VERAPAMIL HCL 2.5 MG/ML IV SOLN
INTRAVENOUS | Status: DC | PRN
  Administered 2015-09-26: 15:00:00 via INTRA_ARTERIAL

## 2015-09-26 MED ORDER — ALBUTEROL SULFATE HFA 108 (90 BASE) MCG/ACT IN AERS: 2.0000 | INHALATION_SPRAY | Freq: Four times a day (QID) | RESPIRATORY_TRACT | Status: DC | PRN

## 2015-09-26 MED ORDER — ONDANSETRON HCL 4 MG/2ML IJ SOLN: 4.0000 mg | Freq: Four times a day (QID) | INTRAMUSCULAR | Status: DC | PRN

## 2015-09-26 MED ORDER — MIDAZOLAM HCL 2 MG/2ML IJ SOLN
INTRAMUSCULAR | Status: DC | PRN
Start: 2015-09-26 — End: 2015-09-26
  Administered 2015-09-26: 2 mg via INTRAVENOUS

## 2015-09-26 MED ORDER — FENTANYL CITRATE (PF) 100 MCG/2ML IJ SOLN
INTRAMUSCULAR | Status: AC
  Filled 2015-09-26: qty 2

## 2015-09-26 MED ORDER — MOMETASONE FURO-FORMOTEROL FUM 100-5 MCG/ACT IN AERO: 2.0000 | INHALATION_SPRAY | Freq: Two times a day (BID) | RESPIRATORY_TRACT | Status: DC

## 2015-09-26 MED ORDER — SODIUM CHLORIDE 0.9 % WEIGHT BASED INFUSION: 1.0000 mL/kg/h | INTRAVENOUS | Status: DC

## 2015-09-26 MED ORDER — NICOTINE 7 MG/24HR TD PT24: 7.0000 mg | MEDICATED_PATCH | Freq: Every day | TRANSDERMAL | Status: DC

## 2015-09-26 MED ORDER — HEPARIN SODIUM (PORCINE) 1000 UNIT/ML IJ SOLN
INTRAMUSCULAR | Status: DC | PRN
  Administered 2015-09-26: 5000 [IU] via INTRAVENOUS

## 2015-09-26 MED ORDER — ATORVASTATIN CALCIUM 40 MG PO TABS
40.0000 mg | ORAL_TABLET | Freq: Every day | ORAL | Status: DC
  Administered 2015-09-26: 40 mg via ORAL
  Filled 2015-09-26: qty 1

## 2015-09-26 MED ORDER — IOHEXOL 350 MG/ML SOLN
INTRAVENOUS | Status: DC | PRN
  Administered 2015-09-26: 55 mL via INTRA_ARTERIAL

## 2015-09-26 MED ORDER — HEPARIN (PORCINE) IN NACL 2-0.9 UNIT/ML-% IJ SOLN
INTRAMUSCULAR | Status: AC
  Filled 2015-09-26: qty 1500

## 2015-09-26 MED ORDER — HEPARIN SODIUM (PORCINE) 1000 UNIT/ML IJ SOLN
INTRAMUSCULAR | Status: AC
  Filled 2015-09-26: qty 1

## 2015-09-26 MED ORDER — ATORVASTATIN CALCIUM 40 MG PO TABS
40.0000 mg | ORAL_TABLET | Freq: Every day | ORAL | Status: DC
Start: 2015-09-26 — End: 2016-04-12

## 2015-09-26 MED ORDER — LIDOCAINE HCL (PF) 1 % IJ SOLN
INTRAMUSCULAR | Status: DC | PRN
  Administered 2015-09-26: 16:00:00

## 2015-09-26 MED ORDER — ACETAMINOPHEN 325 MG PO TABS: 650.0000 mg | ORAL_TABLET | ORAL | Status: DC | PRN

## 2015-09-26 MED ORDER — SODIUM CHLORIDE 0.9 % IJ SOLN: 3.0000 mL | Freq: Two times a day (BID) | INTRAMUSCULAR | Status: DC

## 2015-09-26 MED ORDER — SODIUM CHLORIDE 0.9 % IJ SOLN: 3.0000 mL | INTRAMUSCULAR | Status: DC | PRN

## 2015-09-26 MED ORDER — FENTANYL CITRATE (PF) 100 MCG/2ML IJ SOLN
INTRAMUSCULAR | Status: DC | PRN
  Administered 2015-09-26: 25 ug via INTRAVENOUS

## 2015-09-26 SURGICAL SUPPLY — 10 items
CATH EXPO 5F MPA-1 (CATHETERS) ×2 IMPLANT
CATH INFINITI 5 FR JL3.5 (CATHETERS) ×2 IMPLANT
CATH INFINITI JR4 5F (CATHETERS) ×2 IMPLANT
DEVICE RAD COMP TR BAND LRG (VASCULAR PRODUCTS) ×2 IMPLANT
GLIDESHEATH SLEND SS 6F .021 (SHEATH) ×2 IMPLANT
KIT HEART LEFT (KITS) ×2 IMPLANT
PACK CARDIAC CATHETERIZATION (CUSTOM PROCEDURE TRAY) ×2 IMPLANT
TRANSDUCER W/STOPCOCK (MISCELLANEOUS) ×2 IMPLANT
TUBING CIL FLEX 10 FLL-RA (TUBING) ×2 IMPLANT
WIRE SAFE-T 1.5MM-J .035X260CM (WIRE) ×2 IMPLANT

## 2015-09-26 NOTE — Progress Notes (Signed)
Patient ID: Richard [name], male   DOB: 1961/01/07, 54 y.o.   MRN: [id number] Medicine attending: I personally examined this patient this morning together with resident physician Dr. Shela Leff and I concur with her evaluation and management plan which we discussed together. In view of underlying cardiac risk factors, despite negative elevation of troponin enzymes, he will be taken to cardiac cath today for further evaluation in view of symptoms suggesting unstable angina in a man with a number of cardiac risk factors detailed in Dr. Elon Jester note. It does appear that many of his symptoms are lung related with asthmatic bronchitis in an  ongoing cigarette smoker.

## 2015-09-26 NOTE — Progress Notes (Signed)
Subjective:  Patient was seen and examined this morning. States the breathing treatments he received yesterday helped with his chest pain and SOB. However, he still continues to have symptoms. Reports having CP (substernal and R sided) which feels like "chest tightness" and SOB last night. As before, pain does not radiate to the left, neck or back. He denies associated nausea, vomiting or diaphoresis.    Objective: Vital signs in last 24 hours: Filed Vitals:   09/25/15 1414 09/25/15 2022 09/25/15 2100 09/26/15 0500  BP: 122/72  121/70 107/61  Pulse: 88  94 91  Temp: 98.7 F (37.1 C)  98.7 F (37.1 C) 98.4 F (36.9 C)  TempSrc: Oral     Resp: 18  18 23   Height:      Weight:    94.802 kg (209 lb)  SpO2: 99% 97% 98% 99%   Weight change: -2.722 kg (-6 lb)  Intake/Output Summary (Last 24 hours) at 09/26/15 0831 Last data filed at 09/26/15 [id number]  Gross per 24 hour  Intake 1262.52 ml  Output      0 ml  Net 1262.52 ml   Filed Vitals:   09/25/15 1414 09/25/15 2022 09/25/15 2100 09/26/15 0500  BP: 122/72  121/70 107/61  Pulse: 88  94 91  Temp: 98.7 F (37.1 C)  98.7 F (37.1 C) 98.4 F (36.9 C)  TempSrc: Oral     Resp: 18  18 23   Height:      Weight:    94.802 kg (209 lb)  SpO2: 99% 97% 98% 99%   General: Vital signs reviewed.  Patient is well-developed and well-nourished, in no acute distress and cooperative with exam.  Cardiovascular: RRR, S1 normal, S2 normal, no murmurs, gallops, or rubs. Pulmonary/Chest: Decreased breath sounds (improved since yesterday), no wheezes, rales, or rhonchi. Abdominal: Soft, non-tender, non-distended, BS + Extremities: No lower extremity edema bilaterally Skin: Warm, dry and intact.  Psychiatric: Speech and behavior is normal.    Lab Results: Basic Metabolic Panel:  Recent Labs Lab 09/25/15 0350 09/26/15 0450  NA 134* 133*  K 4.9 4.7  CL 101 99*  CO2 26 28  GLUCOSE 187* 196*  BUN 11 13  CREATININE 0.82 0.90  CALCIUM 9.0 9.1    Liver Function Tests:  Recent Labs Lab 09/24/15 0858  AST 21  ALT 36  ALKPHOS 141*  BILITOT 0.5  PROT 7.8  ALBUMIN 3.4*   CBC:  Recent Labs Lab 09/24/15 0858 09/25/15 0350 09/26/15 0450  WBC 5.3 5.6 4.7  NEUTROABS 3.1  --   --   HGB 13.0 12.1* 12.0*  HCT 39.1 37.7* 38.0*  MCV 83.7 85.3 85.4  PLT 284 257 282   Cardiac Enzymes:  Recent Labs Lab 09/24/15 1445 09/24/15 2030  TROPONINI <0.03 <0.03   CBG:  Recent Labs Lab 09/24/15 2117 09/25/15 0710 09/25/15 1113 09/25/15 1653 09/25/15 2128 09/26/15 0730  GLUCAP 137* 178* 145* 182* 165* 181*   Fasting Lipid Panel:  Recent Labs Lab 09/24/15 1445  CHOL 195  HDL 44  LDLCALC 113*  TRIG 188*  CHOLHDL 4.4   Studies/Results: Dg Chest 2 View  09/24/2015  CLINICAL DATA:  Chest pain radiating to the right for 1 week EXAM: CHEST - 2 VIEW COMPARISON:  None. FINDINGS: The heart size and mediastinal contours are within normal limits. Both lungs are clear. The visualized skeletal structures are unremarkable. IMPRESSION: No active disease. Electronically Signed   By: Inez Catalina M.D.   On: 09/24/2015 08:56  Medications:  I have reviewed the patient's current medications. Prior to Admission:  Prescriptions prior to admission  Medication Sig Dispense Refill Last Dose  . acetaminophen (TYLENOL) 325 MG tablet Take 2 tablets (650 mg total) by mouth every 6 (six) hours as needed for mild pain (or Fever >/= 101).     Marland Kitchen albuterol (PROVENTIL HFA;VENTOLIN HFA) 108 (90 BASE) MCG/ACT inhaler Inhale 2 puffs into the lungs every 6 (six) hours as needed for wheezing or shortness of breath.   09/23/2015 at Unknown time  . docusate sodium (COLACE) 100 MG capsule Take 200 mg by mouth daily as needed for mild constipation.   Past Month at Unknown time  . glipiZIDE (GLUCOTROL) 10 MG tablet Take 10 mg by mouth 2 (two) times daily.   09/23/2015 at Unknown time  . ibuprofen (ADVIL,MOTRIN) 800 MG tablet Take 800 mg by mouth every 8  (eight) hours as needed for moderate pain.   Past Month at Unknown time  . lisinopril (PRINIVIL,ZESTRIL) 10 MG tablet Take 1 tablet by mouth daily.   09/23/2015 at Unknown time  . metFORMIN (GLUCOPHAGE) 1000 MG tablet Take 1,000 mg by mouth 2 (two) times daily with a meal.   09/23/2015 at Unknown time  . pregabalin (LYRICA) 75 MG capsule Take 75 mg by mouth 2 (two) times daily.   09/23/2015 at Unknown time  . metroNIDAZOLE (FLAGYL) 500 MG tablet Take 1 tablet (500 mg total) by mouth every 8 (eight) hours. Take for 9 days then stop. (Patient not taking: Reported on 09/24/2015) 27 tablet 0 Completed Course at Unknown time   Scheduled Meds: . aspirin EC  81 mg Oral Daily  . atorvastatin  40 mg Oral q1800  . guaiFENesin  600 mg Oral BID  . insulin aspart  0-5 Units Subcutaneous QHS  . insulin aspart  0-9 Units Subcutaneous TID WC  . lisinopril  10 mg Oral Daily  . mometasone-formoterol  2 puff Inhalation BID  . nicotine  7 mg Transdermal Daily  . pregabalin  75 mg Oral TID  . sodium chloride  3 mL Intravenous Q12H  . sodium chloride  3 mL Intravenous Q12H   Continuous Infusions: . sodium chloride 1 mL/kg/hr (09/26/15 0700)  . heparin 1,350 Units/hr (09/26/15 0559)   PRN Meds:.sodium chloride, acetaminophen **OR** acetaminophen, albuterol, chlorpheniramine-HYDROcodone, LORazepam, morphine injection, nitroGLYCERIN, ondansetron **OR** ondansetron (ZOFRAN) IV, sodium chloride Assessment/Plan: Active Problems:   Chest pain   Unstable angina (HCC)   Moderate persistent asthma with acute exacerbation in adult  Unstable Angina:Patient presented with complaint of substernal chest pressure with radiation to the right side of his chest that was intermittent, but worsening in intensity and in frequency over the last one month. EKG was without acute ischemic changes (old TWI in aVL, V1) and troponins negative x 3. Given his history of what sounds like stable angina (DOE and chest pain with exertion)  and his significant risk factors of HTN, HLD, tobacco use, and T2DM (Heart Score 4+), we are concerned about unstable angina. Echocardiogram performed, shows normal LV systolic function with ejection fraction 50-55% and no wall motion abnormalities.The aortic valve shows thickening and decreased valve mobility which would account for his basilar systolic murmur.There is also mild mitral regurgitation and mild LA dilation. We have discussed this patient's case with Cardiology and they are planning on doing a coronary angiography today to definitively exclude CAD. They are recommending holding off on beta blocker for now given COPD and recent and frequent wheezing episodes. -Cardiology consulted,  appreciate recommendations -Pending cath results  -Telemetry -Heparin gtt -Aspirin 81 mg daily -Lipitor 40 mg daily started  -Morphine 2 mg Q4H prn -Nitroglycerin SL prn  -Zofran prn nausea   Moderate Persistent Asthma: Patient admits to using his home albuterol inhaler multiple times per day for his asthma symptoms. He was previously only on an albuterol inhaler at home for his asthma. Decreased breath sounds without wheezing heard on examination. Patient also admits to recent increased cough and URI symptoms. If cardiac cause of chest pain is ruled out, this may be the cause of his chest pain.  -Albuterol nebulizer Q6H prn -Start LABA+ICS: Dulera (mometasone-formoterol) 100-5 mcg/act 2 puffs BID -Mucinex 600 mg BID -Tussionex Q12H prn   HLD: His 10 yr risk for ASCVD is 25.4%. 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 Patient is on glipizide 10 mg BID, metformin 1000 mg BID, and Lyrica 75 mg at home.  -SSI-S -HgbA1c pending -Continue home Lyrica 75 mg TID -Carb Modified Diet  Hypertension: Blood pressure stable at present 116/78. -cont home Lisinopril 10 mg daily  Tobacco Abuse: 20+ pack year history.  -Nicotine patch 7 mg daily  Diet: Carb  modified  DVT/PE ppx: Heparin gtt  Dispo: Disposition is deferred at this time, awaiting improvement of current medical problems.  Anticipated discharge in approximately 1-2 day(s).   The patient does have a current PCP (Pcp Not In System) and does not need an [facility] hospital follow-up appointment after discharge.  The patient does not have transportation limitations that hinder transportation to clinic appointments.   LOS: 2 days   Osa Craver, DO PGY-2 Internal Medicine Resident Pager # [phone] 09/26/2015 8:31 AM

## 2015-09-26 NOTE — Interval H&P Note (Signed)
Cath Lab Visit (complete for each Cath Lab visit)  Clinical Evaluation Leading to the Procedure:   ACS: Yes.    Non-ACS:    Anginal Classification: CCS IV  Anti-ischemic medical therapy: Minimal Therapy (1 class of medications)  Non-Invasive Test Results: No non-invasive testing performed  Prior CABG: No previous CABG      History and Physical Interval Note:  09/26/2015 2:47 PM  Richard [name]  has presented today for surgery, with the diagnosis of unstable angina  The various methods of treatment have been discussed with the patient and family. After consideration of risks, benefits and other options for treatment, the patient has consented to  Procedure(s): Left Heart Cath and Coronary Angiography (N/A) as a surgical intervention .  The patient's history has been reviewed, patient examined, no change in status, stable for surgery.  I have reviewed the patient's chart and labs.  Questions were answered to the patient's satisfaction.     Richard [name] S.

## 2015-09-26 NOTE — Plan of Care (Signed)
Problem: Phase I Progression Outcomes Goal: MD aware of Cardiac Marker results Outcome: Completed/Met Date Met:  09/26/15 Pt had negative troponins

## 2015-09-26 NOTE — Progress Notes (Signed)
Patient Name: Richard [name] Date of Encounter: 09/26/2015     Active Problems:   Chest pain   Unstable angina (HCC)   Moderate persistent asthma with acute exacerbation in adult    SUBJECTIVE  Mild chest discomfort overnight which the patient thinks was his "asthma".  Cardiac enzymes are normal.  Awaiting cardiac catheterization today.  CURRENT MEDS . aspirin EC  81 mg Oral Daily  . guaiFENesin  600 mg Oral BID  . insulin aspart  0-5 Units Subcutaneous QHS  . insulin aspart  0-9 Units Subcutaneous TID WC  . lisinopril  10 mg Oral Daily  . mometasone-formoterol  2 puff Inhalation BID  . nicotine  7 mg Transdermal Daily  . pregabalin  75 mg Oral TID  . sodium chloride  3 mL Intravenous Q12H  . sodium chloride  3 mL Intravenous Q12H    OBJECTIVE  Filed Vitals:   09/25/15 1414 09/25/15 2022 09/25/15 2100 09/26/15 0500  BP: 122/72  121/70 107/61  Pulse: 88  94 91  Temp: 98.7 F (37.1 C)  98.7 F (37.1 C) 98.4 F (36.9 C)  TempSrc: Oral     Resp: 18  18 23   Height:      Weight:    209 lb (94.802 kg)  SpO2: 99% 97% 98% 99%    Intake/Output Summary (Last 24 hours) at 09/26/15 0815 Last data filed at 09/26/15 [id number]  Gross per 24 hour  Intake 1262.52 ml  Output      0 ml  Net 1262.52 ml   Filed Weights   09/24/15 1300 09/25/15 0405 09/26/15 0500  Weight: 213 lb 4.8 oz (96.752 kg) 210 lb 6.4 oz (95.437 kg) 209 lb (94.802 kg)    PHYSICAL EXAM  General: Pleasant, NAD. Neuro: Alert and oriented X 3. Moves all extremities spontaneously. Psych: Normal affect. HEENT:  Normal  Neck: Supple without bruits or JVD. Lungs:  Resp regular and unlabored, CTA. Heart: RRR no s3, s4 there is a soft systolic ejection murmur at the base. Abdomen: Soft, non-tender, non-distended, BS + x 4.  Extremities: No clubbing, cyanosis or edema. DP/PT/Radials 2+ and equal bilaterally.  Accessory Clinical Findings  CBC  Recent Labs  09/24/15 0858 09/25/15 0350 09/26/15 0450    WBC 5.3 5.6 4.7  NEUTROABS 3.1  --   --   HGB 13.0 12.1* 12.0*  HCT 39.1 37.7* 38.0*  MCV 83.7 85.3 85.4  PLT 284 257 Q000111Q   Basic Metabolic Panel  Recent Labs  09/25/15 0350 09/26/15 0450  NA 134* 133*  K 4.9 4.7  CL 101 99*  CO2 26 28  GLUCOSE 187* 196*  BUN 11 13  CREATININE 0.82 0.90  CALCIUM 9.0 9.1   Liver Function Tests  Recent Labs  09/24/15 0858  AST 21  ALT 36  ALKPHOS 141*  BILITOT 0.5  PROT 7.8  ALBUMIN 3.4*   No results for input(s): LIPASE, AMYLASE in the last 72 hours. Cardiac Enzymes  Recent Labs  09/24/15 1445 09/24/15 2030  TROPONINI <0.03 <0.03   BNP Invalid input(s): POCBNP D-Dimer No results for input(s): DDIMER in the last 72 hours. Hemoglobin A1C No results for input(s): HGBA1C in the last 72 hours. Fasting Lipid Panel  Recent Labs  09/24/15 1445  CHOL 195  HDL 44  LDLCALC 113*  TRIG 188*  CHOLHDL 4.4   Thyroid Function Tests No results for input(s): TSH, T4TOTAL, T3FREE, THYROIDAB in the last 72 hours.  Invalid input(s): FREET3  TELE  Normal  sinus rhythm  ECG    Radiology/Studies  Dg Chest 2 View  09/24/2015  CLINICAL DATA:  Chest pain radiating to the right for 1 week EXAM: CHEST - 2 VIEW COMPARISON:  None. FINDINGS: The heart size and mediastinal contours are within normal limits. Both lungs are clear. The visualized skeletal structures are unremarkable. IMPRESSION: No active disease. Electronically Signed   By: Inez Catalina M.D.   On: 09/24/2015 08:56    ASSESSMENT AND PLAN  1. Chest pain: Multiple risk factors for CAD. Symptoms concerning for unstable angina. On heparin and ASA. Will add statin therapy as his 10 yr risk for ASCVD is 31.4% (Lipitor 40 mg). Already on lisinopril. Will hold off on beta blocker for now given COPD and recent and frequent wheezing episodes. Will arrange for coronary angiography on 12/19 to definitively exclude CAD. Echocardiogram performed, shows normal LV systolic function  with ejection fraction 50-55% and no wall motion abnormalities.  The aortic valve shows thickening and decreased valve mobility which would account for his basilar systolic murmur.  There is also mild mitral regurgitation by echo  2. Essential HTN: Controlled on lisinopril. No changes.  3. Hyperlipidemia: LDL 113. Given concomitant diabetes, smoking, and HTN, he warrants high-intensity statin therapy. Will add Lipitor 40 mg.  4. Tobacco abuse: Wearing patch. Wants to quit.  5. Type 2 DM: On insulin while hospitalized.  Plan: Cardiac catheterization later today.  Signed, Warren Danes MD

## 2015-09-26 NOTE — Research (Signed)
CADLAD Informed Consent   Subject Name: Richard [name]  Subject met inclusion and exclusion criteria.  The informed consent form, study requirements and expectations were reviewed with the subject and questions and concerns were addressed prior to the signing of the consent form.  The subject verbalized understanding of the trail requirements.  The subject agreed to participate in the CADLAD trial and signed the informed consent.  The informed consent was obtained prior to performance of any protocol-specific procedures for the subject.  A copy of the signed informed consent was given to the subject and a copy was placed in the subject's medical record.  [name] 09/26/2015,1310 pm

## 2015-09-26 NOTE — Progress Notes (Signed)
ANTICOAGULATION CONSULT NOTE - Follow Up Consult  Pharmacy Consult for Heparin Indication: chest pain/ACS  No Known Allergies  Patient Measurements: Height: 6\' 3"  (190.5 cm) Weight: 209 lb (94.802 kg) IBW/kg (Calculated) : 84.5 Heparin Dosing Weight: 94.8 kg  Vital Signs: Temp: 98.4 F (36.9 C) (12/19 0500) BP: 107/61 mmHg (12/19 0500) Pulse Rate: 91 (12/19 0500)  Labs:  Recent Labs  09/24/15 0858 09/24/15 1445 09/24/15 2030 09/24/15 2307 09/25/15 0350 09/26/15 0450  HGB 13.0  --   --   --  12.1* 12.0*  HCT 39.1  --   --   --  37.7* 38.0*  PLT 284  --   --   --  257 282  LABPROT  --   --   --   --   --  14.1  INR  --   --   --   --   --  1.07  HEPARINUNFRC  --   --   --  0.40 0.47 0.43  CREATININE 0.88  --   --   --  0.82 0.90  TROPONINI  --  <0.03 <0.03  --   --   --     Estimated Creatinine Clearance: 109.5 mL/min (by C-G formula based on Cr of 0.9).  Assessment:   Heparin level remains therapeutic (0.43) on 1350 units/hr.   For cardiac cath later today.  Goal of Therapy:  Heparin level 0.3-0.7 units/ml Monitor platelets by anticoagulation protocol: Yes   Plan:   Continue heparin drip at 1350 units/hr.  Daily heparin level and CBC while on heparin.  Follow up post-cath.  Arty Baumgartner, [place] Pager: [phone] 09/26/2015,11:43 AM

## 2015-09-26 NOTE — H&P (View-Only) (Signed)
Patient Name: Richard [name] Date of Encounter: 09/26/2015     Active Problems:   Chest pain   Unstable angina (HCC)   Moderate persistent asthma with acute exacerbation in adult    SUBJECTIVE  Mild chest discomfort overnight which the patient thinks was his "asthma".  Cardiac enzymes are normal.  Awaiting cardiac catheterization today.  CURRENT MEDS . aspirin EC  81 mg Oral Daily  . guaiFENesin  600 mg Oral BID  . insulin aspart  0-5 Units Subcutaneous QHS  . insulin aspart  0-9 Units Subcutaneous TID WC  . lisinopril  10 mg Oral Daily  . mometasone-formoterol  2 puff Inhalation BID  . nicotine  7 mg Transdermal Daily  . pregabalin  75 mg Oral TID  . sodium chloride  3 mL Intravenous Q12H  . sodium chloride  3 mL Intravenous Q12H    OBJECTIVE  Filed Vitals:   09/25/15 1414 09/25/15 2022 09/25/15 2100 09/26/15 0500  BP: 122/72  121/70 107/61  Pulse: 88  94 91  Temp: 98.7 F (37.1 C)  98.7 F (37.1 C) 98.4 F (36.9 C)  TempSrc: Oral     Resp: 18  18 23   Height:      Weight:    209 lb (94.802 kg)  SpO2: 99% 97% 98% 99%    Intake/Output Summary (Last 24 hours) at 09/26/15 0815 Last data filed at 09/26/15 [id number]  Gross per 24 hour  Intake 1262.52 ml  Output      0 ml  Net 1262.52 ml   Filed Weights   09/24/15 1300 09/25/15 0405 09/26/15 0500  Weight: 213 lb 4.8 oz (96.752 kg) 210 lb 6.4 oz (95.437 kg) 209 lb (94.802 kg)    PHYSICAL EXAM  General: Pleasant, NAD. Neuro: Alert and oriented X 3. Moves all extremities spontaneously. Psych: Normal affect. HEENT:  Normal  Neck: Supple without bruits or JVD. Lungs:  Resp regular and unlabored, CTA. Heart: RRR no s3, s4 there is a soft systolic ejection murmur at the base. Abdomen: Soft, non-tender, non-distended, BS + x 4.  Extremities: No clubbing, cyanosis or edema. DP/PT/Radials 2+ and equal bilaterally.  Accessory Clinical Findings  CBC  Recent Labs  09/24/15 0858 09/25/15 0350 09/26/15 0450    WBC 5.3 5.6 4.7  NEUTROABS 3.1  --   --   HGB 13.0 12.1* 12.0*  HCT 39.1 37.7* 38.0*  MCV 83.7 85.3 85.4  PLT 284 257 Q000111Q   Basic Metabolic Panel  Recent Labs  09/25/15 0350 09/26/15 0450  NA 134* 133*  K 4.9 4.7  CL 101 99*  CO2 26 28  GLUCOSE 187* 196*  BUN 11 13  CREATININE 0.82 0.90  CALCIUM 9.0 9.1   Liver Function Tests  Recent Labs  09/24/15 0858  AST 21  ALT 36  ALKPHOS 141*  BILITOT 0.5  PROT 7.8  ALBUMIN 3.4*   No results for input(s): LIPASE, AMYLASE in the last 72 hours. Cardiac Enzymes  Recent Labs  09/24/15 1445 09/24/15 2030  TROPONINI <0.03 <0.03   BNP Invalid input(s): POCBNP D-Dimer No results for input(s): DDIMER in the last 72 hours. Hemoglobin A1C No results for input(s): HGBA1C in the last 72 hours. Fasting Lipid Panel  Recent Labs  09/24/15 1445  CHOL 195  HDL 44  LDLCALC 113*  TRIG 188*  CHOLHDL 4.4   Thyroid Function Tests No results for input(s): TSH, T4TOTAL, T3FREE, THYROIDAB in the last 72 hours.  Invalid input(s): FREET3  TELE  Normal  sinus rhythm  ECG    Radiology/Studies  Dg Chest 2 View  09/24/2015  CLINICAL DATA:  Chest pain radiating to the right for 1 week EXAM: CHEST - 2 VIEW COMPARISON:  None. FINDINGS: The heart size and mediastinal contours are within normal limits. Both lungs are clear. The visualized skeletal structures are unremarkable. IMPRESSION: No active disease. Electronically Signed   By: Inez Catalina M.D.   On: 09/24/2015 08:56    ASSESSMENT AND PLAN  1. Chest pain: Multiple risk factors for CAD. Symptoms concerning for unstable angina. On heparin and ASA. Will add statin therapy as his 10 yr risk for ASCVD is 31.4% (Lipitor 40 mg). Already on lisinopril. Will hold off on beta blocker for now given COPD and recent and frequent wheezing episodes. Will arrange for coronary angiography on 12/19 to definitively exclude CAD. Echocardiogram performed, shows normal LV systolic function  with ejection fraction 50-55% and no wall motion abnormalities.  The aortic valve shows thickening and decreased valve mobility which would account for his basilar systolic murmur.  There is also mild mitral regurgitation by echo  2. Essential HTN: Controlled on lisinopril. No changes.  3. Hyperlipidemia: LDL 113. Given concomitant diabetes, smoking, and HTN, he warrants high-intensity statin therapy. Will add Lipitor 40 mg.  4. Tobacco abuse: Wearing patch. Wants to quit.  5. Type 2 DM: On insulin while hospitalized.  Plan: Cardiac catheterization later today.  Signed, Warren Danes MD

## 2015-09-26 NOTE — Plan of Care (Signed)
Problem: Phase I Progression Outcomes Goal: Aspirin unless contraindicated Outcome: Completed/Met Date Met:  09/26/15 Pt receives 81 mg of aspirin daily

## 2015-09-26 NOTE — Progress Notes (Signed)
Pt has no current PCP. I will arrange for OP f/u with cardiology in 2 weeks. If he is doing well he can see cardiology PRN after that.  Kerin Ransom PA-C 09/26/2015 6:47 PM

## 2015-09-26 NOTE — Discharge Instructions (Signed)
Your heart catheterization did not show any blockages. Your chest pain was likely due to your uncontrolled asthma. However, you are at risk for developing heart disease given that you are a male, smoker, have diabetes and high cholesterol. We recommend you stop smoking and take the following medications for your heart and lungs:  Albuterol inhaler as needed Dulera inhaler twice a day Aspirin 81 mg once a day Atorvastatin 40 mg once a day Lisinopril 10 mg once a day Nicotine patches   Please follow up with your primary care doctor in [place].

## 2015-09-27 ENCOUNTER — Encounter (HOSPITAL_COMMUNITY): Payer: Self-pay | Admitting: Interventional Cardiology

## 2015-09-27 NOTE — Discharge Summary (Signed)
Name: Richard [name] MRN: [id number] DOB: 12-01-1960 54 y.o. PCP: Pcp Not In System  Date of Admission: 09/24/2015  7:42 AM Date of Discharge: 09/29/2015 Attending Physician: No att. providers found  Discharge Diagnosis:  Active Problems:   Chest pain   Unstable angina (HCC)   Moderate persistent asthma with acute exacerbation in adult   Pain in the chest  Discharge Medications:   Medication List    STOP taking these medications        metroNIDAZOLE 500 MG tablet  Commonly known as:  FLAGYL      TAKE these medications        acetaminophen 325 MG tablet  Commonly known as:  TYLENOL  Take 2 tablets (650 mg total) by mouth every 6 (six) hours as needed for mild pain (or Fever >/= 101).  Notes to Patient:  AS NEEDED     albuterol 108 (90 BASE) MCG/ACT inhaler  Commonly known as:  PROVENTIL HFA;VENTOLIN HFA  Inhale 2 puffs into the lungs every 6 (six) hours as needed for wheezing or shortness of breath.  Notes to Patient:  AS NEEDED     aspirin 81 MG EC tablet  Take 1 tablet (81 mg total) by mouth daily.     atorvastatin 40 MG tablet  Commonly known as:  LIPITOR  Take 1 tablet (40 mg total) by mouth daily at 6 PM.     docusate sodium 100 MG capsule  Commonly known as:  COLACE  Take 200 mg by mouth daily as needed for mild constipation.  Notes to Patient:  AS NEEDED     glipiZIDE 10 MG tablet  Commonly known as:  GLUCOTROL  Take 10 mg by mouth 2 (two) times daily.     ibuprofen 800 MG tablet  Commonly known as:  ADVIL,MOTRIN  Take 800 mg by mouth every 8 (eight) hours as needed for moderate pain.     lisinopril 10 MG tablet  Commonly known as:  PRINIVIL,ZESTRIL  Take 1 tablet by mouth daily.     metFORMIN 1000 MG tablet  Commonly known as:  GLUCOPHAGE  Take 1,000 mg by mouth 2 (two) times daily with a meal.  Notes to Patient:  Wait 48 hours after Catheterization to resume     mometasone-formoterol 100-5 MCG/ACT Aero  Commonly known as:  DULERA  Inhale  2 puffs into the lungs 2 (two) times daily.     nicotine 7 mg/24hr patch  Commonly known as:  NICODERM CQ - dosed in mg/24 hr  Place 1 patch (7 mg total) onto the skin daily.     pregabalin 75 MG capsule  Commonly known as:  LYRICA  Take 75 mg by mouth 2 (two) times daily.        Disposition and follow-up:   [name] was discharged from [facility] in Good condition.  At the hospital follow up visit please address:  1.  Please assess compliance to nicotine patches at follow-up visit.   2.  Labs / imaging needed at time of follow-up:  3.  Pending labs/ test needing follow-up:   Follow-up Appointments:     Follow-up Information    Follow up with [name]., MD.   Specialties:  Cardiology, Radiology, Interventional Cardiology   Why:  office will contact you for follow up   Contact information:   [address] [address] [phone]       Discharge Instructions: Discharge Instructions    Diet -  low sodium heart healthy    Complete by:  As directed      Increase activity slowly    Complete by:  As directed           Your heart catheterization did not show any blockages. Your chest pain was likely due to your uncontrolled asthma. However, you are at risk for developing heart disease given that you are a male, smoker, have diabetes and high cholesterol. We recommend you stop smoking and take the following medications for your heart and lungs:  Albuterol inhaler as needed Dulera inhaler twice a day Aspirin 81 mg once a day Atorvastatin 40 mg once a day Lisinopril 10 mg once a day Nicotine patches   Please follow up with your primary care doctor in [place].    Consultations: Treatment Team:  Jerline Pain, MD  Procedures Performed:  Dg Chest 2 View  09/24/2015  CLINICAL DATA:  Chest pain radiating to the right for 1 week EXAM: CHEST - 2 VIEW COMPARISON:  None. FINDINGS: The heart size and  mediastinal contours are within normal limits. Both lungs are clear. The visualized skeletal structures are unremarkable. IMPRESSION: No active disease. Electronically Signed   By: Inez Catalina M.D.   On: 09/24/2015 08:56    2D Echo:  09/25/15 Study Conclusions  - Left ventricle: The cavity size was normal. Wall thickness was normal. Systolic function was normal. The estimated ejection fraction was in the range of 50% to 55%. Wall motion was normal; there were no regional wall motion abnormalities. - Aortic valve: Trileaflet; moderately thickened, moderately calcified leaflets. Valve mobility was restricted. - Mitral valve: Calcified annulus. Moderately thickened, mildly calcified leaflets . There was mild regurgitation. - Left atrium: The atrium was mildly dilated.   Cardiac Cath:  Left Heart Cath and Coronary Angiography    Conclusion     Mild nonobstructive CAD.  LVEDP 17 mm Hg.  Continue aggressive preventive therapy. He needs to stop smoking.      Admission HPI: Mr. [name] is a 54 yo male with PMHx of T2DM (A1c 6.8 in 2015), asthma, and HTN who presents to the ED with a chief complaint of chest pain. Patient reports having a "bad cold" for the past 1 month. States he's been having non-productive cough and chest congestion. Reports having chest pain for the past 2 weeks that has been getting progressively worse. States the pain is 7-8/ 10 intensity, intermittent, and sharp in character. States it is located substernally and radiates to the right side of his chest. States the chest pain is better with rest and rubbing his chest; worse with sitting up and coughing. As per patient, the chest pain has been getting stronger and lasting longer over time - initially when the pain started it used to last 2-3 minutes each time but now each episode lasts 10 minutes. States the pain was unbearable early this morning and woke him up at 5 AM. This time the pain lasted 1 minute.  Patient states he gets "chest tightness" every time he coughs but this morning the pain felt like "a cam truck running over my chest." States his chest pain is associated with tingling in his hands and "dizziness" which he described as a sensation of room spinning around him. States the "dizziness" is sometimes associated with the chest pain and sometimes when going from supine/ sitting to standing position, however, gets better when he sits down and rests. Also reports having dyspnea with exertion. Denies having any  fevers, chills, palpitations, or nausea. Denies having any GERD symptoms. Denies having any musculoskeletal injury to the chest area. Denies any history of recent travel and states he stays busy doing chores at home. He does have a history of asthma and states earlier he was using his inhaler every other day but now he has been using it up to 3 times a day. States he has been smoking one half pack per day of cigarettes since age of 89. Denies any drug or alcohol use. States his mother had congestive heart failure at the age of 41 and sister had a brain aneurysm at age 58.    Hospital Course by problem list: Active Problems:   Chest pain   Unstable angina (HCC)   Moderate persistent asthma with acute exacerbation in adult   Pain in the chest   Chest pain:Patient presented with complaint of substernal chest pressure with radiation to the right side of his chest that was intermittent, but worsening in intensity and in frequency over the last one month. EKG was without acute ischemic changes (old TWI in aVL, V1) and troponins negative x 3. CXR was without active disease, without obvious cardiomegaly or mediastinal widening. He was given morphine 4 mg injection and Aspirin 325 mg in the ED. He was started on Heparin gtt and continued on aspirin 81 mg daily and sublingual nitroglycerin 0.4 mg as needed. He was continued on home medication ACE inhibitor for blood pressure control and  started on a statin. Echocardiogram performed, showing normal LV systolic function with ejection fraction 50-55% and no wall motion abnormalities.The aortic valve showed thickening and decreased valve mobility which could account for his basilar systolic murmur.There was also mild mitral regurgitation and mild LA dilation. Given his history and his significant risk factors of HTN, HLD, Tobacco Abuse and T2DM (Heart Score 4+), we were concerned for unstable angina. Cardiology was consulted and they did left heart cath and coronary angiography on 09/26/15 - showing mild non-obstructive CAD and LVEDP of 17 mm Hg. Cardiology recommended aggressive preventative therapy and the need for the patient to stop smoking. Since patient does not have a PCP in [facility], cardiology said they will arrange outpatient follow-up for the patient for a visit with them in 2 weeks. We have advised the patient to follow-up with his PCP in [place]. Patient was stable after the cardiac cath procedure and discharged to home the same day. Patient was started on Nicotine patches for smoking cessation.   Moderate Persistent Asthma with acute exacerbation: Patient reported using his home albuterol inhaler multiple times per day for his asthma symptoms. He also reported having recent increased cough and URI symptoms. Given his asthma had worsened, likely related to a viral upper respiratory tract infection, we initiated appropriate therapy which included a combination inhaled steroid and long-acting beta agonist and as needed short acting beta agonist. Patients symptoms improved and he was stable on the day of discharge.    Discharge Vitals:   BP 119/54 mmHg  Pulse 84  Temp(Src) 98.4 F (36.9 C) (Oral)  Resp 4  Ht 6\' 3"  (1.905 m)  Wt 94.802 kg (209 lb)  BMI 26.12 kg/m2  SpO2 96%  Discharge Labs:  No results found for this or any previous visit (from the past 24 hour(s)).  Signed: Shela Leff, MD 09/29/2015, 3:36  PM    Services Ordered on Discharge:  Equipment Ordered on Discharge:

## 2015-09-28 ENCOUNTER — Encounter: Payer: Self-pay | Admitting: Cardiology

## 2015-09-29 ENCOUNTER — Telehealth: Payer: Self-pay

## 2015-09-29 DIAGNOSIS — F17201 Nicotine dependence, unspecified, in remission: Secondary | ICD-10-CM | POA: Insufficient documentation

## 2015-09-29 DIAGNOSIS — E1139 Type 2 diabetes mellitus with other diabetic ophthalmic complication: Secondary | ICD-10-CM | POA: Insufficient documentation

## 2015-09-29 DIAGNOSIS — E1151 Type 2 diabetes mellitus with diabetic peripheral angiopathy without gangrene: Secondary | ICD-10-CM | POA: Insufficient documentation

## 2015-09-29 DIAGNOSIS — E1165 Type 2 diabetes mellitus with hyperglycemia: Secondary | ICD-10-CM | POA: Insufficient documentation

## 2015-09-29 DIAGNOSIS — I10 Essential (primary) hypertension: Secondary | ICD-10-CM | POA: Insufficient documentation

## 2015-09-29 MED ORDER — FLUTICASONE-SALMETEROL 250-50 MCG/DOSE IN AEPB: 1.0000 | INHALATION_SPRAY | Freq: Two times a day (BID) | RESPIRATORY_TRACT | Status: DC

## 2015-09-29 NOTE — Telephone Encounter (Signed)
Switched Dulera to Advair.

## 2015-09-29 NOTE — Telephone Encounter (Signed)
Dr. Marvel Plan, I rec'd a fax from the pharmacy that [facility] ordered is not covered by the patients insurance.  Would you like to change it to something else?

## 2015-10-12 ENCOUNTER — Ambulatory Visit: Payer: Medicare Other | Admitting: Cardiology

## 2015-10-20 ENCOUNTER — Encounter: Payer: Self-pay | Admitting: Cardiology

## 2015-10-20 ENCOUNTER — Ambulatory Visit (INDEPENDENT_AMBULATORY_CARE_PROVIDER_SITE_OTHER): Payer: Medicare Other | Admitting: Cardiology

## 2015-10-20 VITALS — BP 142/70 | HR 92 | Ht 75.0 in | Wt 217.5 lb

## 2015-10-20 DIAGNOSIS — R079 Chest pain, unspecified: Secondary | ICD-10-CM | POA: Diagnosis not present

## 2015-10-20 DIAGNOSIS — E119 Type 2 diabetes mellitus without complications: Secondary | ICD-10-CM

## 2015-10-20 DIAGNOSIS — F17210 Nicotine dependence, cigarettes, uncomplicated: Secondary | ICD-10-CM | POA: Diagnosis not present

## 2015-10-20 DIAGNOSIS — J449 Chronic obstructive pulmonary disease, unspecified: Secondary | ICD-10-CM

## 2015-10-20 DIAGNOSIS — J45909 Unspecified asthma, uncomplicated: Secondary | ICD-10-CM

## 2015-10-20 DIAGNOSIS — I10 Essential (primary) hypertension: Secondary | ICD-10-CM

## 2015-10-20 DIAGNOSIS — I2510 Atherosclerotic heart disease of native coronary artery without angina pectoris: Secondary | ICD-10-CM | POA: Diagnosis not present

## 2015-10-20 DIAGNOSIS — Z720 Tobacco use: Secondary | ICD-10-CM

## 2015-10-20 MED ORDER — LISINOPRIL 10 MG PO TABS: ORAL_TABLET | ORAL | Status: DC

## 2015-10-20 NOTE — Assessment & Plan Note (Signed)
On inhalers

## 2015-10-20 NOTE — Assessment & Plan Note (Signed)
Attempting to quit

## 2015-10-20 NOTE — Assessment & Plan Note (Signed)
Controlled, possibly some orthostatic symptoms at times during the day

## 2015-10-20 NOTE — Patient Instructions (Signed)
Schedule appointment with new primary Dr   Take Lisinopril 10 mg every night    Your physician wants you to follow-up in: 6 months with Kerin Ransom PA. You will receive a reminder letter in the mail two months in advance. If you don't receive a letter, please call our office to schedule the follow-up appointment.

## 2015-10-20 NOTE — Assessment & Plan Note (Signed)
On Glucotrol and Lyrica

## 2015-10-20 NOTE — Assessment & Plan Note (Signed)
Admitted 09/25/15-/09/26/15

## 2015-10-20 NOTE — Progress Notes (Signed)
10/20/2015 Richard [name]   [birth year]  [id number]  Primary Physician Pcp Not In System Primary Cardiologist: Dr Irish Lack   HPI:  55 y/o AA male from [facility] [address], just moved here. He is on disability and has no PCP yet here. He has a history of HTN, COPD, and HLD. He was admitted to [facility] 09/26/15 with chest pain. He had multiple cardiac risk factors and his EKG had questionable anterior ST changes. It was decided to proceed with coronary angiogram. This was done 09/26/15 by Dr Irish Lack and showed mild non obstructive CAD. Echo showed an EF of 50-55%. Hgb A1c was 10.           He is seen in the office today for follow up. He is working on quitting, still smokes < 1/2 pk a day. He has occasional chest pain and I reassured him this was most likely non cardiac.    Current Outpatient Prescriptions  Medication Sig Dispense Refill  . acetaminophen (TYLENOL) 325 MG tablet Take 2 tablets (650 mg total) by mouth every 6 (six) hours as needed for mild pain (or Fever >/= 101).    Marland Kitchen albuterol (PROVENTIL HFA;VENTOLIN HFA) 108 (90 BASE) MCG/ACT inhaler Inhale 2 puffs into the lungs every 6 (six) hours as needed for wheezing or shortness of breath. 1 Inhaler 11  . aspirin EC 81 MG EC tablet Take 1 tablet (81 mg total) by mouth daily. 30 tablet 3  . atorvastatin (LIPITOR) 40 MG tablet Take 1 tablet (40 mg total) by mouth daily at 6 PM. 30 tablet 3  . glipiZIDE (GLUCOTROL) 10 MG tablet Take 10 mg by mouth 2 (two) times daily.    Marland Kitchen ibuprofen (ADVIL,MOTRIN) 800 MG tablet Take 800 mg by mouth every 8 (eight) hours as needed for moderate pain.    Marland Kitchen lisinopril (PRINIVIL,ZESTRIL) 10 MG tablet Take 1 tablet by mouth daily.    . metFORMIN (GLUCOPHAGE) 1000 MG tablet Take 1,000 mg by mouth 2 (two) times daily with a meal.    . nicotine (NICODERM CQ - DOSED IN MG/24 HR) 7 mg/24hr patch Place 1 patch (7 mg total) onto the skin daily. 28 patch 3  . pregabalin (LYRICA) 75 MG capsule Take 75 mg by mouth 2 (two) times daily.    Marland Kitchen  docusate sodium (COLACE) 100 MG capsule Take 200 mg by mouth daily as needed for mild constipation. Reported on 10/20/2015    . DULERA 100-5 MCG/ACT AERO Inhale 2 puffs into the lungs 2 (two) times daily as needed. Inhale 2 puffs by mouth into the lungs twice a day  3   No current facility-administered medications for this visit.    No Known Allergies  Social History   Social History  . Marital Status: Single    Spouse Name: N/A  . Number of Children: N/A  . Years of Education: N/A   Occupational History  . Not on file.   Social History Main Topics  . Smoking status: Current Every Day Smoker -- 0.50 packs/day    Types: Cigarettes  . Smokeless tobacco: Not on file  . Alcohol Use: No  . Drug Use: No  . Sexual Activity: Not on file   Other Topics Concern  . Not on file   Social History Narrative     Review of Systems: General: negative for chills, fever, night sweats or weight changes.  Cardiovascular: negative for chest pain, dyspnea on exertion, edema, orthopnea, palpitations, paroxysmal nocturnal dyspnea or shortness of breath Dermatological: negative for  rash Respiratory: negative for cough or wheezing Urologic: negative for hematuria Abdominal: negative for nausea, vomiting, diarrhea, bright red blood per rectum, melena, or hematemesis Neurologic: negative for visual changes, syncope, or dizziness All other systems reviewed and are otherwise negative except as noted above.    Blood pressure 142/70, pulse 92, height 6\' 3"  (1.905 m), weight 217 lb 8 oz (98.657 kg).  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 I encouraged him to get a PCP in the area and provided him with some contacts. F/U in 6 months.   Kerin Ransom K PA-C 10/20/2015 2:48 PM

## 2015-11-30 ENCOUNTER — Ambulatory Visit (INDEPENDENT_AMBULATORY_CARE_PROVIDER_SITE_OTHER): Payer: Medicare (Managed Care) | Admitting: Internal Medicine

## 2015-11-30 ENCOUNTER — Encounter: Payer: Self-pay | Admitting: Internal Medicine

## 2015-11-30 VITALS — BP 126/74 | HR 90 | Temp 98.4°F | Ht 75.0 in | Wt 222.4 lb

## 2015-11-30 DIAGNOSIS — Z7984 Long term (current) use of oral hypoglycemic drugs: Secondary | ICD-10-CM

## 2015-11-30 DIAGNOSIS — D649 Anemia, unspecified: Secondary | ICD-10-CM | POA: Diagnosis not present

## 2015-11-30 DIAGNOSIS — I2510 Atherosclerotic heart disease of native coronary artery without angina pectoris: Secondary | ICD-10-CM

## 2015-11-30 DIAGNOSIS — L309 Dermatitis, unspecified: Secondary | ICD-10-CM | POA: Insufficient documentation

## 2015-11-30 DIAGNOSIS — F17211 Nicotine dependence, cigarettes, in remission: Secondary | ICD-10-CM

## 2015-11-30 DIAGNOSIS — E875 Hyperkalemia: Secondary | ICD-10-CM

## 2015-11-30 DIAGNOSIS — E11319 Type 2 diabetes mellitus with unspecified diabetic retinopathy without macular edema: Secondary | ICD-10-CM | POA: Diagnosis not present

## 2015-11-30 DIAGNOSIS — Z79899 Other long term (current) drug therapy: Secondary | ICD-10-CM

## 2015-11-30 DIAGNOSIS — I10 Essential (primary) hypertension: Secondary | ICD-10-CM

## 2015-11-30 DIAGNOSIS — Z0000 Encounter for general adult medical examination without abnormal findings: Secondary | ICD-10-CM

## 2015-11-30 DIAGNOSIS — E1142 Type 2 diabetes mellitus with diabetic polyneuropathy: Secondary | ICD-10-CM | POA: Diagnosis not present

## 2015-11-30 DIAGNOSIS — F17201 Nicotine dependence, unspecified, in remission: Secondary | ICD-10-CM

## 2015-11-30 DIAGNOSIS — J984 Other disorders of lung: Secondary | ICD-10-CM

## 2015-11-30 DIAGNOSIS — J449 Chronic obstructive pulmonary disease, unspecified: Secondary | ICD-10-CM

## 2015-11-30 DIAGNOSIS — E663 Overweight: Secondary | ICD-10-CM | POA: Insufficient documentation

## 2015-11-30 DIAGNOSIS — E1165 Type 2 diabetes mellitus with hyperglycemia: Secondary | ICD-10-CM | POA: Diagnosis not present

## 2015-11-30 DIAGNOSIS — E1140 Type 2 diabetes mellitus with diabetic neuropathy, unspecified: Secondary | ICD-10-CM | POA: Insufficient documentation

## 2015-11-30 DIAGNOSIS — Z6827 Body mass index (BMI) 27.0-27.9, adult: Secondary | ICD-10-CM

## 2015-11-30 MED ORDER — TRIAMCINOLONE ACETONIDE 0.1 % EX CREA: 1.0000 "application " | TOPICAL_CREAM | Freq: Two times a day (BID) | CUTANEOUS | Status: DC

## 2015-11-30 NOTE — Assessment & Plan Note (Signed)
HPI: he takes lisinopril 10mg  once daily.  A: Essential HTN well controlled  P: -Continue Lisinopril 10mg  daily.

## 2015-11-30 NOTE — Assessment & Plan Note (Addendum)
HPI: He has been told that he has both Asthma and COPD, He has never had a formal PFTs.  He reports that he previously got short of breath especially with smoking.  During his previous hospitalization he was started on dulera and he stopped smoking.  Since that time he rarely needs his albuterol inhaler.  A: COPD with asthma - I have reviewed his previous CXR which does not have the chacteristic findings of COPD, he has been a heavy smoker for many years.   P: Will try to clarify if he has an obstructive lung disease and the severity with formal PFTS, in the mean time I will have him continue dulera and albuterol PRN.

## 2015-11-30 NOTE — Patient Instructions (Addendum)
General Instructions:  I want you to get Pulmonary function tests.  Call me if your sugars are high.  I am also sending you for a colonoscopy.   Please bring your medicines with you each time you come to clinic.  Medicines may include prescription medications, over-the-counter medications, herbal remedies, eye drops, vitamins, or other pills.   Progress Toward Treatment Goals:  No flowsheet data found.  Self Care Goals & Plans:  No flowsheet data found.  No flowsheet data found.   Care Management & Community Referrals:  No flowsheet data found.     Sitagliptin oral tablet What is this medicine? SITAGLIPTIN (sit a GLIP tin) helps to treat type 2 diabetes. It helps to control blood sugar. Treatment is combined with diet and exercise. This medicine may be used for other purposes; ask your health care provider or pharmacist if you have questions. What should I tell my health care provider before I take this medicine? They need to know if you have any of these conditions: -diabetic ketoacidosis -kidney disease -pancreatitis -previous swelling of the tongue, face, or lips with difficulty breathing, difficulty swallowing, hoarseness, or tightening of the throat -type 1 diabetes -an unusual or allergic reaction to sitagliptin, other medicines, foods, dyes, or preservatives -pregnant or trying to get pregnant -breast-feeding How should I use this medicine? Take this medicine by mouth with a glass of water. Follow the directions on the prescription label. You can take it with or without food. Do not cut, crush or chew this medicine. Take your dose at the same time each day. Do not take more often than directed. Do not stop taking except on your doctor's advice. Talk to your pediatrician regarding the use of this medicine in children. Special care may be needed. Overdosage: If you think you have taken too much of this medicine contact a poison control center or emergency room at  once. NOTE: This medicine is only for you. Do not share this medicine with others. What if I miss a dose? If you miss a dose, take it as soon as you can. If it is almost time for your next dose, take only that dose. Do not take double or extra doses. What may interact with this medicine? Do not take this medicine with any of the following medications: -gatifloxacin This medicine may also interact with the following medications: -alcohol -digoxin -insulin -sulfonylureas like glimepiride, glipizide, glyburide This list may not describe all possible interactions. Give your health care provider a list of all the medicines, herbs, non-prescription drugs, or dietary supplements you use. Also tell them if you smoke, drink alcohol, or use illegal drugs. Some items may interact with your medicine. What should I watch for while using this medicine? Visit your doctor or health care professional for regular checks on your progress. A test called the HbA1C (A1C) will be monitored. This is a simple blood test. It measures your blood sugar control over the last 2 to 3 months. You will receive this test every 3 to 6 months. Learn how to check your blood sugar. Learn the symptoms of low and high blood sugar and how to manage them. Always carry a quick-source of sugar with you in case you have symptoms of low blood sugar. Examples include hard sugar candy or glucose tablets. Make sure others know that you can choke if you eat or drink when you develop serious symptoms of low blood sugar, such as seizures or unconsciousness. They must get medical help at once. Tell  your doctor or health care professional if you have high blood sugar. You might need to change the dose of your medicine. If you are sick or exercising more than usual, you might need to change the dose of your medicine. Do not skip meals. Ask your doctor or health care professional if you should avoid alcohol. Many nonprescription cough and cold products  contain sugar or alcohol. These can affect blood sugar. Wear a medical ID bracelet or chain, and carry a card that describes your disease and details of your medicine and dosage times. What side effects may I notice from receiving this medicine? Side effects that you should report to your doctor or health care professional as soon as possible: -allergic reactions like skin rash, itching or hives, swelling of the face, lips, or tongue -breathing problems -fever, chills -joint pain -loss of appetite -signs and symptoms of low blood sugar such as feeling anxious, confusion, dizziness, increased hunger, unusually weak or tired, sweating, shakiness, cold, irritable, headache, blurred vision, fast heartbeat, loss of consciousness -unusual stomach pain or discomfort -vomiting Side effects that usually do not require medical attention (report to your doctor or health care professional if they continue or are bothersome): -diarrhea -headache -sore throat -stomach upset -stuffy or runny nose This list may not describe all possible side effects. Call your doctor for medical advice about side effects. You may report side effects to FDA at 1-800-FDA-1088. Where should I keep my medicine? Keep out of the reach of children. Store at room temperature between 15 and 30 degrees C (59 and 86 degrees F). Throw away any unused medicine after the expiration date. NOTE: This sheet is a summary. It may not cover all possible information. If you have questions about this medicine, talk to your doctor, pharmacist, or health care provider.    2016, Elsevier/Gold Standard. (2014-06-04 17:46:21)

## 2015-11-30 NOTE — Progress Notes (Addendum)
[facility] Subjective:   Patient ID: Richard [name] male   DOB: 1961/06/04 55 y.o.   MRN: [id number]  HPI: [name] is a 55 y.o. male with a PMH detailed below who presents to establish care with a new PCP.  He is from [facility] and moved to the [place] area about a year ago however has been traveling back and forth to [facility], he has now decided to make [place] his permeant home.  In December he was admitted to the hospital with chest pain, workup at that time included a LHC which showed mild non obstructive CAD.  He subsequently followed up with Cardiology in January and was recommended to establish with a PCP.  Please see problem based charting below for the status of his chronic medical problems.   Past Medical History  Diagnosis Date  . Asthma   . Diabetes mellitus without complication ([place])   . Essential hypertension    Current Outpatient Prescriptions  Medication Sig Dispense Refill  . acetaminophen (TYLENOL) 325 MG tablet Take 2 tablets (650 mg total) by mouth every 6 (six) hours as needed for mild pain (or Fever >/= 101).    Marland Kitchen albuterol (PROVENTIL HFA;VENTOLIN HFA) 108 (90 BASE) MCG/ACT inhaler Inhale 2 puffs into the lungs every 6 (six) hours as needed for wheezing or shortness of breath. 1 Inhaler 11  . aspirin EC 81 MG EC tablet Take 1 tablet (81 mg total) by mouth daily. 30 tablet 3  . atorvastatin (LIPITOR) 40 MG tablet Take 1 tablet (40 mg total) by mouth daily at 6 PM. 30 tablet 3  . docusate sodium (COLACE) 100 MG capsule Take 200 mg by mouth daily as needed for mild constipation. Reported on 10/20/2015    . DULERA 100-5 MCG/ACT AERO Inhale 2 puffs into the lungs 2 (two) times daily as needed. Inhale 2 puffs by mouth into the lungs twice a day  3  . glipiZIDE (GLUCOTROL) 10 MG tablet Take 10 mg by mouth 2 (two) times daily.    Marland Kitchen ibuprofen (ADVIL,MOTRIN) 800 MG tablet Take 800 mg by mouth every 8 (eight) hours as needed for moderate pain.    Marland Kitchen  lisinopril (PRINIVIL,ZESTRIL) 10 MG tablet Take 10 mg every night 30 tablet 6  . metFORMIN (GLUCOPHAGE) 1000 MG tablet Take 1,000 mg by mouth 2 (two) times daily with a meal.    . nicotine (NICODERM CQ - DOSED IN MG/24 HR) 7 mg/24hr patch Place 1 patch (7 mg total) onto the skin daily. 28 patch 3  . pregabalin (LYRICA) 75 MG capsule Take 75 mg by mouth 2 (two) times daily.    Marland Kitchen triamcinolone cream (KENALOG) 0.1 % Apply 1 application topically 2 (two) times daily. 80 g 1   No current facility-administered medications for this visit.   No family history on file. Social History   Social History  . Marital Status: Single    Spouse Name: N/A  . Number of Children: N/A  . Years of Education: N/A   Social History Main Topics  . Smoking status: Current Every Day Smoker -- 0.50 packs/day    Types: Cigarettes  . Smokeless tobacco: Not on file  . Alcohol Use: No  . Drug Use: No  . Sexual Activity: Not on file   Other Topics Concern  . Not on file   Social History Narrative   Review of Systems: Review of Systems  Constitutional: Negative for fever and chills.  Eyes: Negative for blurred vision.  Respiratory:  Negative for cough and shortness of breath.   Cardiovascular: Negative for chest pain and leg swelling.  Gastrointestinal: Negative for abdominal pain.  Genitourinary: Negative for dysuria.  Musculoskeletal: Negative for falls.  Skin: Positive for itching and rash (legs chronic).  Neurological: Negative for dizziness and headaches.  Endo/Heme/Allergies: Negative for polydipsia.  Psychiatric/Behavioral: Negative for substance abuse.  All other systems reviewed and are negative.    Objective:  Physical Exam: Filed Vitals:   11/30/15 1349  BP: 126/74  Pulse: 90  Temp: 98.4 F (36.9 C)  TempSrc: Oral  Height: 6\' 3"  (1.905 m)  Weight: 222 lb 6.4 oz (100.88 kg)  SpO2: 100%  Physical Exam  Constitutional: He is oriented to person, place, and time and well-developed,  well-nourished, and in no distress.  HENT:  Head: Normocephalic and atraumatic.  Mouth/Throat: Oropharynx is clear and moist.  Eyes: Conjunctivae are normal.  Cardiovascular: Normal rate and regular rhythm.   Pulses:      Dorsalis pedis pulses are 2+ on the right side, and 2+ on the left side.       Posterior tibial pulses are 2+ on the right side, and 2+ on the left side.  Pulmonary/Chest: Effort normal and breath sounds normal.  Abdominal: Soft. Bowel sounds are normal. There is no tenderness.  Musculoskeletal: He exhibits no edema.  Neurological: He is alert and oriented to person, place, and time.  Skin:  Dry scaly rash on dorsum of bilateral feet and anterior lower legs bilaterally  Psychiatric: Affect normal.  Nursing note and vitals reviewed.   Assessment & Plan:  Case discussed with Dr. Dareen Piano  Essential hypertension HPI: he takes lisinopril 10mg  once daily.  A: Essential HTN well controlled  P: -Continue Lisinopril 10mg  daily.  COPD with asthma ([place]) HPI: He has been told that he has both Asthma and COPD, He has never had a formal PFTs.  He reports that he previously got short of breath especially with smoking.  During his previous hospitalization he was started on dulera and he stopped smoking.  Since that time he rarely needs his albuterol inhaler.  A: COPD with asthma - I have reviewed his previous CXR which does not have the chacteristic findings of COPD, he has been a heavy smoker for many years.   P: Will try to clarify if he has an obstructive lung disease and the severity with formal PFTS, in the mean time I will have him continue dulera and albuterol PRN.  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  He has an opthalmology and he reports he has an appointment for "Laser surgery" soon.  His  medications include Metformin 1g BID, Glipizide 10mg  BID.  His last A1c in the hospital was 10.9 but at a recent visit 2 weeks ago he reports his A1c was in the 8 range.  He currently feels well without complaints.  A:  Uncontrolled Type 2 DM with opthalmic complication and diabetic retinopathy  P: -Obtain records from previous PCP - Continue metformin and glipizide - Discussed weight loss and nutrition, willing to get additional help with diabetic education. - Discussed adding medication to get to A1c <7.  He is hesitent and "hates needles,"  Wants to do research, I discussed that if his A1c is 8 we may consider Januiva. - Continue Lyrica for diabetic nerve pain  Eczema A: Eczema of  dorsum of foot bilateral and anterior legs.  P: Trial of Kenalog cream  Tobacco abuse, in remission  Assessment: Progress toward smoking cessation:  stopped smoking Barriers to progress toward smoking cessation:    Comments:   Plan: Instruction/counseling given:  I commended patient for quitting and reviewed strategies for preventing relapses. Educational resources provided:    Self management tools provided:    Medications to assist with smoking cessation:  None Patient agreed to the following self-care plans for smoking cessation:    Other plans:     Overweight (BMI 25.0-29.9) A: Overweight  P:  Discussed diet and weight loss strategies, referral placed for diabetic education.  CAD in native artery HPI: Was previously told he needed Atorvastatin since he was a smoker, now that he has stopped smoking he has stopped atorvastatin.  A: Mild non obstructive CAD, 10-year ASCVD risk ~20%  P: I went through the risk caluclator tool with him and showed that by stopping smoking he reduced his risk from >30% to ~20% however we would still recommend a high intensty statin and shoot for a goal LDL <100.  He reports that he understands this and will restart Atorvastatin.  Absolute anemia HPI: Mild  normocytic anemia. hgb of 11.5 to 12 in the hospital.  A: Unspecified anemia  P:  Repeat CBC, anemia panel  - CBC returned with Hgb improved - He is still due for a screening colonoscopy which I ordered today    Medications Ordered Meds ordered this encounter  Medications  . triamcinolone cream (KENALOG) 0.1 %    Sig: Apply 1 application topically 2 (two) times daily.    Dispense:  80 g    Refill:  1   Other Orders Orders Placed This Encounter  Procedures  . Anemia panel  . CBC no Diff  . BMP8+Anion Gap  . Ambulatory referral to Gastroenterology    Referral Priority:  Routine    Referral Type:  Consultation    Referral Reason:  Specialty Services Required    Number of Visits Requested:  1  . Ambulatory referral to diabetic education    Referral Priority:  Routine    Referral Type:  Consultation    Referral Reason:  Specialty Services Required    Referred to Provider:  Chauncey Reading Plyler, RD    Number of Visits Requested:  1  . Pulmonary function test    Standing Status: Future     Number of Occurrences:      Standing Expiration Date: 11/29/2016    Order Specific Question:  Where should this test be performed?    Answer:  [facility]    Order Specific Question:  Full PFT: includes the following: basic spirometry, spirometry pre & post bronchodilator, diffusion capacity (DLCO), lung volumes    Answer:  Full PFT   Follow Up: Return in about 2 months (around 01/28/2016).

## 2015-12-01 ENCOUNTER — Encounter: Payer: Self-pay | Admitting: Internal Medicine

## 2015-12-01 DIAGNOSIS — D649 Anemia, unspecified: Secondary | ICD-10-CM | POA: Insufficient documentation

## 2015-12-01 DIAGNOSIS — D62 Acute posthemorrhagic anemia: Secondary | ICD-10-CM | POA: Insufficient documentation

## 2015-12-01 LAB — BMP8+ANION GAP
ANION GAP: 16 mmol/L (ref 10.0–18.0)
BUN/Creatinine Ratio: 10 (ref 9–20)
BUN: 10 mg/dL (ref 6–24)
CO2: 23 mmol/L (ref 18–29)
CREATININE: 0.98 mg/dL (ref 0.76–1.27)
Calcium: 8.9 mg/dL (ref 8.7–10.2)
Chloride: 102 mmol/L (ref 96–106)
GFR calc Af Amer: 99 mL/min/{1.73_m2} (ref >59)
GFR calc non Af Amer: 86 mL/min/{1.73_m2} (ref >59)
Glucose: 82 mg/dL (ref 65–99)
Potassium: 5.3 mmol/L — ABNORMAL HIGH (ref 3.5–5.2)
SODIUM: 141 mmol/L (ref 134–144)

## 2015-12-01 LAB — ANEMIA PANEL
FERRITIN: 115 ng/mL (ref 30–400)
FOLATE, RBC: 1111 ng/mL (ref >498)
Folate, Hemolysate: 413.4 ng/mL
Hematocrit: 37.2 % — ABNORMAL LOW (ref 37.5–51.0)
Iron Saturation: 31 % (ref 15–55)
Iron: 103 ug/dL (ref 38–169)
RETIC CT PCT: 1.3 % (ref 0.6–2.6)
TIBC: 337 ug/dL (ref 250–450)
UIBC: 234 ug/dL (ref 111–343)
Vitamin B-12: 645 pg/mL (ref 211–946)

## 2015-12-01 LAB — CBC
HEMOGLOBIN: 12.6 g/dL (ref 12.6–17.7)
MCH: 27.8 pg (ref 26.6–33.0)
MCHC: 33.9 g/dL (ref 31.5–35.7)
MCV: 82 fL (ref 79–97)
Platelets: 294 10*3/uL (ref 150–379)
RBC: 4.54 x10E6/uL (ref 4.14–5.80)
RDW: 14.7 % (ref 12.3–15.4)
WBC: 4.9 10*3/uL (ref 3.4–10.8)

## 2015-12-01 NOTE — Assessment & Plan Note (Signed)
A: Eczema of dorsum of foot bilateral and anterior legs.  P: Trial of Kenalog cream

## 2015-12-01 NOTE — Assessment & Plan Note (Signed)
  Assessment: Progress toward smoking cessation:  stopped smoking Barriers to progress toward smoking cessation:    Comments:   Plan: Instruction/counseling given:  I commended patient for quitting and reviewed strategies for preventing relapses. Educational resources provided:    Self management tools provided:    Medications to assist with smoking cessation:  None Patient agreed to the following self-care plans for smoking cessation:    Other plans:

## 2015-12-01 NOTE — Assessment & Plan Note (Signed)
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  He has an opthalmology and he reports he has an appointment for "Laser surgery" soon.  His medications include Metformin 1g BID, Glipizide 10mg  BID.  His last A1c in the hospital was 10.9 but at a recent visit 2 weeks ago he reports his A1c was in the 8 range.  He currently feels well without complaints.  A:  Uncontrolled Type 2 DM with opthalmic complication and diabetic retinopathy  P: -Obtain records from previous PCP - Continue metformin and glipizide - Discussed weight loss and nutrition, willing to get additional help with diabetic education. - Discussed adding medication to get to A1c <7.  He is hesitent and "hates needles,"  Wants to do research, I discussed that if his A1c is 8 we may consider Januiva. - Continue Lyrica for diabetic nerve pain

## 2015-12-01 NOTE — Addendum Note (Signed)
Addended by: [name] on: 12/01/2015 05:11 PM   Modules accepted: Orders

## 2015-12-01 NOTE — Assessment & Plan Note (Signed)
HPI: Was previously told he needed Atorvastatin since he was a smoker, now that he has stopped smoking he has stopped atorvastatin.  A: Mild non obstructive CAD, 10-year ASCVD risk ~20%  P: I went through the risk caluclator tool with him and showed that by stopping smoking he reduced his risk from >30% to ~20% however we would still recommend a high intensty statin and shoot for a goal LDL <100.  He reports that he understands this and will restart Atorvastatin.

## 2015-12-01 NOTE — Assessment & Plan Note (Signed)
HPI: Mild normocytic anemia. hgb of 11.5 to 12 in the hospital.  A: Unspecified anemia  P:  Repeat CBC, anemia panel  - CBC returned with Hgb improved - He is still due for a screening colonoscopy which I ordered today

## 2015-12-01 NOTE — Assessment & Plan Note (Signed)
A: Overweight  P:  Discussed diet and weight loss strategies, referral placed for diabetic education.

## 2015-12-02 ENCOUNTER — Encounter: Payer: Self-pay | Admitting: Gastroenterology

## 2015-12-05 NOTE — Progress Notes (Signed)
Internal Medicine Clinic Attending  Case discussed with Dr. Hoffman soon after the resident saw the patient.  We reviewed the resident's history and exam and pertinent patient test results.  I agree with the assessment, diagnosis, and plan of care documented in the resident's note. 

## 2015-12-20 ENCOUNTER — Ambulatory Visit (HOSPITAL_COMMUNITY)
Admission: RE | Admit: 2015-12-20 | Discharge: 2015-12-20 | Disposition: A | Discharge status: Home / Self Care | Payer: Medicare Other | Source: Ambulatory Visit | Attending: Internal Medicine | Admitting: Internal Medicine

## 2015-12-20 DIAGNOSIS — J449 Chronic obstructive pulmonary disease, unspecified: Secondary | ICD-10-CM | POA: Diagnosis present

## 2015-12-20 LAB — PULMONARY FUNCTION TEST
DL/VA % pred: 83 %
DL/VA: 3.98 ml/min/mmHg/L
DLCO UNC % PRED: 51 %
DLCO unc: 18.71 ml/min/mmHg
FEF 25-75 PRE: 1.53 L/s
FEF 25-75 Post: 2.67 L/sec
FEF2575-%CHANGE-POST: 74 %
FEF2575-%PRED-POST: 79 %
FEF2575-%Pred-Pre: 45 %
FEV1-%CHANGE-POST: 17 %
FEV1-%PRED-POST: 66 %
FEV1-%PRED-PRE: 56 %
FEV1-POST: 2.39 L
FEV1-PRE: 2.02 L
FEV1FVC-%CHANGE-POST: -6 %
FEV1FVC-%Pred-Pre: 94 %
FEV6-%CHANGE-POST: 26 %
FEV6-%PRED-POST: 75 %
FEV6-%Pred-Pre: 59 %
FEV6-POST: 3.33 L
FEV6-PRE: 2.63 L
FEV6FVC-%Change-Post: 0 %
FEV6FVC-%PRED-PRE: 100 %
FEV6FVC-%Pred-Post: 100 %
FVC-%CHANGE-POST: 26 %
FVC-%PRED-POST: 75 %
FVC-%Pred-Pre: 59 %
FVC-Post: 3.41 L
FVC-Pre: 2.7 L
POST FEV6/FVC RATIO: 97 %
PRE FEV6/FVC RATIO: 97 %
Post FEV1/FVC ratio: 70 %
Pre FEV1/FVC ratio: 75 %
RV % PRED: 87 %
RV: 2.06 L
TLC % pred: 65 %
TLC: 4.94 L

## 2015-12-20 MED ORDER — ALBUTEROL SULFATE (2.5 MG/3ML) 0.083% IN NEBU
2.5000 mg | INHALATION_SOLUTION | Freq: Once | RESPIRATORY_TRACT | Status: AC
  Administered 2015-12-20: 2.5 mg via RESPIRATORY_TRACT

## 2016-01-10 ENCOUNTER — Ambulatory Visit (AMBULATORY_SURGERY_CENTER): Payer: Self-pay | Admitting: *Deleted

## 2016-01-10 VITALS — Ht 75.0 in | Wt 220.4 lb

## 2016-01-10 DIAGNOSIS — Z1211 Encounter for screening for malignant neoplasm of colon: Secondary | ICD-10-CM

## 2016-01-10 MED ORDER — SUPREP BOWEL PREP KIT 17.5-3.13-1.6 GM/177ML PO SOLN: 1.0000 | Freq: Once | ORAL | Status: DC

## 2016-01-10 NOTE — Progress Notes (Signed)
Patient denies any allergies to egg or soy products. Patient denies complications with anesthesia/sedation.  Patient denies oxygen use at home and denies diet medications. Emmi instructions for colonoscopy explained but patient denied.     

## 2016-01-11 ENCOUNTER — Telehealth: Payer: Self-pay | Admitting: *Deleted

## 2016-01-12 NOTE — Telephone Encounter (Signed)
Encounter accidentally opened during chart review - erroneous encounter - pt not actually contacted by phone. Yvonna Alanis, RN, 01/12/16, 12:40 P

## 2016-01-24 ENCOUNTER — Ambulatory Visit (AMBULATORY_SURGERY_CENTER): Payer: 59 | Admitting: Gastroenterology

## 2016-01-24 ENCOUNTER — Encounter: Payer: Self-pay | Admitting: Gastroenterology

## 2016-01-24 VITALS — BP 122/78 | HR 77 | Temp 97.8°F | Resp 19 | Ht 75.0 in | Wt 220.0 lb

## 2016-01-24 DIAGNOSIS — D123 Benign neoplasm of transverse colon: Secondary | ICD-10-CM | POA: Diagnosis not present

## 2016-01-24 DIAGNOSIS — Z1211 Encounter for screening for malignant neoplasm of colon: Secondary | ICD-10-CM | POA: Diagnosis not present

## 2016-01-24 LAB — GLUCOSE, CAPILLARY
Glucose-Capillary: 154 mg/dL — ABNORMAL HIGH (ref 65–99)
Glucose-Capillary: 154 mg/dL — ABNORMAL HIGH (ref 65–99)

## 2016-01-24 MED ORDER — SODIUM CHLORIDE 0.9 % IV SOLN: 500.0000 mL | INTRAVENOUS | Status: DC

## 2016-01-24 NOTE — Progress Notes (Signed)
No egg or soy allergy known to patient  No issues with past sedation with any surgeries  or procedures, no intubation problems  No diet pills per patient No home 02 use per patient  No blood thinners per patient  Pt denies issues with constipation   

## 2016-01-24 NOTE — Op Note (Signed)
[facility] Patient Name: Richard [name] Procedure Date: 01/24/2016 8:20 AM MRN: [id number] Endoscopist: Mauri Pole , MD Age: 55 Date of Birth: 03/13/1961 Gender: Male Procedure:                Colonoscopy Indications:              Screening for colorectal malignant neoplasm Medicines:                Monitored Anesthesia Care Procedure:                Pre-Anesthesia Assessment:                           - Prior to the procedure, a History and Physical                            was performed, and patient medications and                            allergies were reviewed. The patient's tolerance of                            previous anesthesia was also reviewed. The risks                            and benefits of the procedure and the sedation                            options and risks were discussed with the patient.                            All questions were answered, and informed consent                            was obtained. Prior Anticoagulants: The patient has                            taken no previous anticoagulant or antiplatelet                            agents. ASA Grade Assessment: II - A patient with                            mild systemic disease. After reviewing the risks                            and benefits, the patient was deemed in                            satisfactory condition to undergo the procedure.                           After obtaining informed consent, the colonoscope  was passed under direct vision. Throughout the                            procedure, the patient's blood pressure, pulse, and                            oxygen saturations were monitored continuously. The                            Model CF-HQ190L [id number]) scope was introduced                            through the anus and advanced to the the terminal                            ileum, with identification of the appendiceal                          orifice and IC valve. The colonoscopy was performed                            without difficulty. The patient tolerated the                            procedure well. The quality of the bowel                            preparation was good. The ileocecal valve,                            appendiceal orifice, and rectum were photographed. Scope In: 8:25:11 AM Scope Out: 8:38:30 AM Scope Withdrawal Time: 0 hours 10 minutes 12 seconds  Total Procedure Duration: 0 hours 13 minutes 19 seconds  Findings:                 The perianal and digital rectal examinations were                            normal.                           A 3 mm polyp was found in the transverse colon. The                            polyp was sessile. The polyp was removed with a                            cold biopsy forceps. Resection and retrieval were                            complete.                           Non-bleeding internal hemorrhoids were found during  retroflexion. The hemorrhoids were small. Complications:            No immediate complications. Estimated Blood Loss:     Estimated blood loss: none. Impression:               - One 3 mm polyp in the transverse colon, removed                            with a cold biopsy forceps. Resected and retrieved.                           - Non-bleeding internal hemorrhoids. Recommendation:           - Patient has a contact number available for                            emergencies. The signs and symptoms of potential                            delayed complications were discussed with the                            patient. Return to normal activities tomorrow.                            Written discharge instructions were provided to the                            patient.                           - Resume previous diet.                           - Continue present medications.                           - Await  pathology results.                           - Repeat colonoscopy in 5-10 years for surveillance.                           - Return to GI clinic PRN. Mauri Pole, MD 01/24/2016 8:44:44 AM This report has been signed electronically.

## 2016-01-24 NOTE — Patient Instructions (Signed)
YOU HAD AN ENDOSCOPIC PROCEDURE TODAY AT [facility]:   Refer to the procedure report that was given to you for any specific questions about what was found during the examination.  If the procedure report does not answer your questions, please call your gastroenterologist to clarify.  If you requested that your care partner not be given the details of your procedure findings, then the procedure report has been included in a sealed envelope for you to review at your convenience later.  YOU SHOULD EXPECT: Some feelings of bloating in the abdomen. Passage of more gas than usual.  Walking can help get rid of the air that was put into your GI tract during the procedure and reduce the bloating. If you had a lower endoscopy (such as a colonoscopy or flexible sigmoidoscopy) you may notice spotting of blood in your stool or on the toilet paper. If you underwent a bowel prep for your procedure, you may not have a normal bowel movement for a few days.  Please Note:  You might notice some irritation and congestion in your nose or some drainage.  This is from the oxygen used during your procedure.  There is no need for concern and it should clear up in a day or so.  SYMPTOMS TO REPORT IMMEDIATELY:   Following lower endoscopy (colonoscopy or flexible sigmoidoscopy):  Excessive amounts of blood in the stool  Significant tenderness or worsening of abdominal pains  Swelling of the abdomen that is new, acute  Fever of 100F or higher   For urgent or emergent issues, a gastroenterologist can be reached at any hour by calling [phone].   DIET: Your first meal following the procedure should be a small meal and then it is ok to progress to your normal diet. Heavy or fried foods are harder to digest and may make you feel nauseous or bloated.  Likewise, meals heavy in dairy and vegetables can increase bloating.  Drink plenty of fluids but you should avoid alcoholic beverages for 24  hours.  ACTIVITY:  You should plan to take it easy for the rest of today and you should NOT DRIVE or use heavy machinery until tomorrow (because of the sedation medicines used during the test).    FOLLOW UP: Our staff will call the number listed on your records the next business day following your procedure to check on you and address any questions or concerns that you may have regarding the information given to you following your procedure. If we do not reach you, we will leave a message.  However, if you are feeling well and you are not experiencing any problems, there is no need to return our call.  We will assume that you have returned to your regular daily activities without incident.  If any biopsies were taken you will be contacted by phone or by letter within the next 1-3 weeks.  Please call us at [phone] if you have not heard about the biopsies in 3 weeks.    SIGNATURES/CONFIDENTIALITY: You and/or your care partner have signed paperwork which will be entered into your electronic medical record.  These signatures attest to the fact that that the information above on your After Visit Summary has been reviewed and is understood.  Full responsibility of the confidentiality of this discharge information lies with you and/or your care-partner.  Please review polyp and hemorrhoid handouts provided. Next colonoscopy determined by pathology results.

## 2016-01-24 NOTE — Progress Notes (Signed)
Patient awakening,vss,report to rn 

## 2016-01-24 NOTE — Progress Notes (Signed)
Called to room to assist during endoscopic procedure.  Patient ID and intended procedure confirmed with present staff. Received instructions for my participation in the procedure from the performing physician.  

## 2016-01-25 ENCOUNTER — Telehealth: Payer: Self-pay | Admitting: *Deleted

## 2016-01-25 NOTE — Telephone Encounter (Signed)
  Follow up Call-  Call back number 01/24/2016  Post procedure Call Back phone  # [phone]  Permission to leave phone message Yes     Patient questions:  Do you have a fever, pain , or abdominal swelling? No. Pain Score  0 *  Have you tolerated food without any problems? Yes.    Have you been able to return to your normal activities? Yes.    Do you have any questions about your discharge instructions: Diet   No. Medications  No. Follow up visit  No.  Do you have questions or concerns about your Care? No.  Actions: * If pain score is 4 or above: No action needed, pain <4.

## 2016-01-27 ENCOUNTER — Telehealth: Payer: Self-pay | Admitting: *Deleted

## 2016-01-27 NOTE — Telephone Encounter (Signed)
Encounter opened in error - erroneous encounter - no phone call made - Yvonna Alanis, RN, 01/27/16 - 6:12 P

## 2016-01-31 ENCOUNTER — Telehealth: Payer: Self-pay

## 2016-01-31 ENCOUNTER — Encounter: Payer: Self-pay | Admitting: Gastroenterology

## 2016-01-31 NOTE — Telephone Encounter (Signed)
Can you change Proventil to Proair?  Insurance updates no longer cover the Proventil

## 2016-02-01 MED ORDER — ALBUTEROL SULFATE HFA 108 (90 BASE) MCG/ACT IN AERS: 1.0000 | INHALATION_SPRAY | Freq: Four times a day (QID) | RESPIRATORY_TRACT | Status: DC | PRN

## 2016-02-01 NOTE — Telephone Encounter (Signed)
Change to proair per request

## 2016-02-06 ENCOUNTER — Encounter: Payer: Self-pay | Admitting: Internal Medicine

## 2016-02-06 DIAGNOSIS — D126 Benign neoplasm of colon, unspecified: Secondary | ICD-10-CM | POA: Insufficient documentation

## 2016-02-06 NOTE — Telephone Encounter (Signed)
LVM for pt informing change made

## 2016-02-13 ENCOUNTER — Other Ambulatory Visit: Payer: Self-pay | Admitting: Internal Medicine

## 2016-02-23 ENCOUNTER — Encounter: Payer: Self-pay | Admitting: Internal Medicine

## 2016-02-23 ENCOUNTER — Encounter: Payer: Self-pay | Admitting: Dietician

## 2016-02-23 ENCOUNTER — Ambulatory Visit (INDEPENDENT_AMBULATORY_CARE_PROVIDER_SITE_OTHER): Payer: 59 | Admitting: Internal Medicine

## 2016-02-23 ENCOUNTER — Other Ambulatory Visit: Payer: Self-pay | Admitting: Dietician

## 2016-02-23 ENCOUNTER — Ambulatory Visit (INDEPENDENT_AMBULATORY_CARE_PROVIDER_SITE_OTHER): Payer: 59 | Admitting: Dietician

## 2016-02-23 VITALS — BP 127/81 | HR 92 | Temp 98.7°F | Ht 75.0 in | Wt 219.4 lb

## 2016-02-23 DIAGNOSIS — E1165 Type 2 diabetes mellitus with hyperglycemia: Secondary | ICD-10-CM

## 2016-02-23 DIAGNOSIS — Z720 Tobacco use: Secondary | ICD-10-CM

## 2016-02-23 DIAGNOSIS — F17210 Nicotine dependence, cigarettes, uncomplicated: Secondary | ICD-10-CM

## 2016-02-23 DIAGNOSIS — I10 Essential (primary) hypertension: Secondary | ICD-10-CM

## 2016-02-23 DIAGNOSIS — Z713 Dietary counseling and surveillance: Secondary | ICD-10-CM | POA: Diagnosis not present

## 2016-02-23 DIAGNOSIS — E1139 Type 2 diabetes mellitus with other diabetic ophthalmic complication: Secondary | ICD-10-CM

## 2016-02-23 DIAGNOSIS — E875 Hyperkalemia: Secondary | ICD-10-CM

## 2016-02-23 DIAGNOSIS — J449 Chronic obstructive pulmonary disease, unspecified: Secondary | ICD-10-CM

## 2016-02-23 DIAGNOSIS — Z7984 Long term (current) use of oral hypoglycemic drugs: Secondary | ICD-10-CM

## 2016-02-23 DIAGNOSIS — E11319 Type 2 diabetes mellitus with unspecified diabetic retinopathy without macular edema: Secondary | ICD-10-CM

## 2016-02-23 DIAGNOSIS — L309 Dermatitis, unspecified: Secondary | ICD-10-CM | POA: Diagnosis not present

## 2016-02-23 LAB — GLUCOSE, CAPILLARY: GLUCOSE-CAPILLARY: 302 mg/dL — AB (ref 65–99)

## 2016-02-23 LAB — POCT GLYCOSYLATED HEMOGLOBIN (HGB A1C): Hemoglobin A1C: 9.9

## 2016-02-23 MED ORDER — CANAGLIFLOZIN 100 MG PO TABS: 100.0000 mg | ORAL_TABLET | Freq: Every day | ORAL | Status: DC

## 2016-02-23 MED ORDER — DESOXIMETASONE 0.25 % EX CREA: 1.0000 "application " | TOPICAL_CREAM | Freq: Two times a day (BID) | CUTANEOUS | Status: DC

## 2016-02-23 MED ORDER — ONETOUCH VERIO FLEX SYSTEM W/DEVICE KIT: 1.0000 | PACK | Freq: Two times a day (BID) | Status: DC

## 2016-02-23 MED ORDER — VARENICLINE TARTRATE 1 MG PO TABS: 1.0000 mg | ORAL_TABLET | Freq: Two times a day (BID) | ORAL | Status: DC

## 2016-02-23 MED ORDER — DULERA 100-5 MCG/ACT IN AERO: 2.0000 | INHALATION_SPRAY | Freq: Two times a day (BID) | RESPIRATORY_TRACT | Status: DC | PRN

## 2016-02-23 MED ORDER — VARENICLINE TARTRATE 0.5 MG X 11 & 1 MG X 42 PO MISC: ORAL | Status: DC

## 2016-02-23 NOTE — Patient Instructions (Addendum)
Changes you talked about trying to make to lower my weight and blood sugar    Less sugar in my coffee or supplementing it with fake sugar  Trying to use 2% milk instead of whole  Adding vegetables to my diet   Eating fruit like banana apple or orange instead of chips  Eta baked grilled, oven fried, or broiled foods instead of fried foods

## 2016-02-23 NOTE — Progress Notes (Signed)
Patient wants to attend diabetes self management classes at [facility] and M[facility] here at [facility]. Request refferal to classes at [facility] on patient's behalf.

## 2016-02-23 NOTE — Patient Instructions (Signed)
I want you to change to Topicort cream.  I am starting Invokana 100mg  a day for your diabetes.  I want you to follow up in 3 months.  I have placed a referral to Podiatry.  I have ordered you Chantix to help stop smoking,  I want you to gradually stop smoking but to continue the medication for 2 months.

## 2016-02-23 NOTE — Progress Notes (Signed)
  Medical Nutrition Therapy:  Appt start time: [id number] end time:  1634. Visit # 1  Assessment:  Primary concerns today: blood sugar control via meal panningu.  Wants a referral to diabetes classes and to continue here to work on his diet. He just moved from [place] and does his own food shopping and cooking. He reports wanting to quit smoking also at this time and has severe neuropathy in his feet that prevent too much walking. He describes his current meal plan as eating what he wants without regard to how it affects his blood sugar - his 24 hour recall shows ~ 500 gram carb/day. His estimated need is ~ 300 grams per day. He says his problem is he does not like many vegetables- only collards, broccoli, corn, potatoes.  Preferred Learning Style:No preference indicated  Learning Readiness: he says he is ready to begin to make changes  ANTHROPOMETRICS: weight-219#, height-75", BMI-27, IBW-196- 216# WEIGHT HISTORY:says he feels better about 200# and his sugars are better also SLEEP:not discussed today in detail althouth he says he goes to bed about 9 Pm and wakes about 5 am. MEDICATIONS:he reports adherence to max dose metformin & glipizide BLOOD SUGAR:a1c > 11%, has meter that we cannot download, reports blood sugars are 300s DIETARY INTAKE: Usual eating pattern includes 3 meals and 2 snacks per day.   24-hr recall:  Wakes at 5 AM- has 2 cups of coffee with 2 tablespoons sugar each and a good 1/4 cup creamer ( 48 g carb) B ( 8-9 AM): eggs, bacon, waffles or toast, if waffles has syrup and butter, 12 oz milk or his 2nd cup coffee ( ~100 grams)  L ( 1 PM): 2 hot dogs with mustard, ketchup, sauerkraut, relish, sugar free drink ( ~ 70-80 grams carb)  Snk ( 3-4 PM): 7-10 chips ahoy clookies,  12 oz whole milk and a bowl of chips, sugar free drink (~ 100 g carb)  D (7  PM): fried chicken wings x3, rice and gravy, 2-3 slices bread, corn, ~ 90 g carb)  Snk ( PM): 7-10 chips ahoy clookies,  12 oz whole  milk and a bowl of chips, sugar free drink (~ 100 g carb)  Beverages: milk, coffee, sugar free drink  Usual physical activity: ADLs andn light walking  Estimated energy needs: 2200-2500 calories 275-300 g carbohydrates   Progress Towards Goal(s):  In progress.   Nutritional Diagnosis:  NI-5.8.2 Excessive carbohydrate intake As related to his snack choices and sugar intake.  As evidenced by his 24 horus recall and report.    Intervention:  Nutrition education about carbohydrates, portion sizes and their affect on his blood sugar. He was provided with a one touch verio meter today. He needs testing supplies.  Coordination of care:   Teaching Method Utilized: Visual,, Auditory,Hands on  Handouts given during visit include: AVS Barriers to learning/adherence to lifestyle change:  None noted today Demonstrated degree of understanding via:  Teach Back   Monitoring/Evaluation:  Dietary intake, exercise,meter, and body weight in 4 week(s).

## 2016-02-24 ENCOUNTER — Other Ambulatory Visit: Payer: Self-pay | Admitting: Dietician

## 2016-02-24 DIAGNOSIS — E1165 Type 2 diabetes mellitus with hyperglycemia: Principal | ICD-10-CM

## 2016-02-24 DIAGNOSIS — E11319 Type 2 diabetes mellitus with unspecified diabetic retinopathy without macular edema: Secondary | ICD-10-CM

## 2016-02-24 LAB — BMP8+ANION GAP
Anion Gap: 15 mmol/L (ref 10.0–18.0)
BUN / CREAT RATIO: 16 (ref 9–20)
BUN: 17 mg/dL (ref 6–24)
CHLORIDE: 98 mmol/L (ref 96–106)
CO2: 21 mmol/L (ref 18–29)
Calcium: 9.1 mg/dL (ref 8.7–10.2)
Creatinine, Ser: 1.09 mg/dL (ref 0.76–1.27)
GFR calc Af Amer: 87 mL/min/{1.73_m2} (ref >59)
GFR calc non Af Amer: 75 mL/min/{1.73_m2} (ref >59)
GLUCOSE: 275 mg/dL — AB (ref 65–99)
Potassium: 5.5 mmol/L — ABNORMAL HIGH (ref 3.5–5.2)
SODIUM: 134 mmol/L (ref 134–144)

## 2016-02-24 NOTE — Assessment & Plan Note (Signed)
HPI: Sugars have been running higher in 300 range over the past month or so, he attributes this to his diet which has had a lot of carbs.  He wants to avoid injections and remain on oral hypoglycemic agents.  He does have some polyuria and polydispia.  A: Uncontrolled Type 2 DM without insulin  P: Continue metformin and glipizide Add Invokana, will titrate up to 300mg  a day. If still not controlled we may need to reconsider an injection would prefer GLP-1 agonist (maybe once weekly bydueron)

## 2016-02-24 NOTE — Assessment & Plan Note (Signed)
HPI: He has replapsed with his smoking, he is smoking about 1ppd.  He would like to quit and wants to try medication to help him quit.  A: Tobacco use disorder  P: Discussed Chantix, Rx 2 month course, instructed to gradually reduce use until he is able to quit completely.  Follow up in 2-3 months.  Spent dedicated 4 minutes to cessation counseling.

## 2016-02-24 NOTE — Telephone Encounter (Signed)
Needs supplies for his new meter to check blood sugar

## 2016-02-24 NOTE — Assessment & Plan Note (Signed)
HPI: He reports advair makes him cough when he uses it, he did much better with dulera.  A: COPD with asthma  P: Will restart Dulera.

## 2016-02-24 NOTE — Assessment & Plan Note (Signed)
HPI: Doing well, no issues with lisinopril.  A: Essential HTN, well controlled  P: Repeat BMP>>> K+5.5, called patient will have him stop lisinopril for 1 week and repeat Labs Starting Invokana for DM, may help with HTN.

## 2016-02-24 NOTE — Assessment & Plan Note (Signed)
HPI: Kenalog cream helped some but still has failed to resolve his eczema completely.  A: Eczema  P: WIll increase potency of steroid, Rx Topicort cream

## 2016-02-24 NOTE — Progress Notes (Signed)
[facility] Subjective:   Patient ID: Richard [name] male   DOB: 10/14/1960 55 y.o.   MRN: [id number]  HPI: Richard [name] is a 55 y.o. male with a PMH detailed below who presents for 3 month follow up of DM, HTN.  Please see problem based charting below for the status of his chronic medical problems.    Past Medical History  Diagnosis Date  . Asthma   . Diabetes mellitus without complication ([place])     diagnosed in 2013  . Essential hypertension   . Hyperlipidemia   . Neuromuscular disorder (HCC)     neuropathy in feet  . Glaucoma     bilateral, surgery on left eye but not right  . Substance abuse     crack - recovered x 30 yrs  . 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 albuterol (PROAIR HFA) 108 (90 Base) MCG/ACT inhaler Inhale 1 puff into the lungs every 6 (six) hours as needed for wheezing or shortness of breath. 1 Inhaler 3  . aspirin EC 81 MG EC tablet Take 1 tablet (81 mg total) by mouth daily. (Patient not taking: Reported on 01/10/2016) 30 tablet 3  . atorvastatin (LIPITOR) 40 MG tablet Take 1 tablet (40 mg total) by mouth daily at 6 PM. 30 tablet 3  . Blood Glucose Monitoring Suppl (ONETOUCH VERIO FLEX SYSTEM) w/Device KIT 1 each by Does not apply route 2 (two) times daily. 1 kit 0  . canagliflozin (INVOKANA) 100 MG TABS tablet Take 1 tablet (100 mg total) by mouth daily before breakfast. 30 tablet 5  . desoximetasone (TOPICORT) 0.25 % cream Apply 1 application topically 2 (two) times daily. 100 g 2  . DULERA 100-5 MCG/ACT AERO Inhale 2 puffs into the lungs 2 (two) times daily as needed. Inhale 2 puffs by mouth into the lungs twice a day 1 Inhaler 3  . glipiZIDE (GLUCOTROL) 10 MG tablet Take 10 mg by mouth 2 (two) times daily.    Marland Kitchen latanoprost (XALATAN) 0.005 % ophthalmic solution INSTILL 1 DROP INTO BOTH EYES IN THE EVENING  11  . lisinopril (PRINIVIL,ZESTRIL) 10 MG tablet Take 10 mg every night  (Patient taking differently: daily. Take 10 mg every night) 30 tablet 6  . metFORMIN (GLUCOPHAGE) 1000 MG tablet Take 1,000 mg by mouth 2 (two) times daily with a meal.    . nicotine (NICODERM CQ - DOSED IN MG/24 HR) 7 mg/24hr patch Place 1 patch (7 mg total) onto the skin daily. (Patient not taking: Reported on 01/10/2016) 28 patch 3  . pregabalin (LYRICA) 75 MG capsule Take 75 mg by mouth 2 (two) times daily.    Marland Kitchen triamcinolone cream (KENALOG) 0.1 % APPLY TO THE AFFECTED AREA TWICE DAILY 80 g 0  . [START ON 03/24/2016] varenicline (CHANTIX CONTINUING MONTH PAK) 1 MG tablet Take 1 tablet (1 mg total) by mouth 2 (two) times daily. 60 tablet 0  . varenicline (CHANTIX STARTING MONTH PAK) 0.5 MG X 11 & 1 MG X 42 tablet Take one 0.5 mg tablet by mouth once daily for 3 days, then increase to one 0.5 mg tablet twice daily for 4 days, then increase to one 1 mg tablet twice daily. 53 tablet 0   No current facility-administered medications for this visit.   Family History  Problem Relation Age of Onset  . Hypertension Mother   . Colon cancer Neg Hx   . Colon polyps Neg Hx   .  Esophageal cancer Neg Hx   . Rectal cancer Neg Hx   . Stomach cancer Neg Hx    Social History   Social History  . Marital Status: Single    Spouse Name: N/A  . Number of Children: N/A  . Years of Education: N/A   Social History Main Topics  . Smoking status: Current Every Day Smoker -- 1.00 packs/day    Types: Cigarettes  . Smokeless tobacco: Never Used     Comment: 5-6 CIAGARETTES A DAY  . Alcohol Use: No  . Drug Use: No  . Sexual Activity: Not Asked   Other Topics Concern  . None   Social History Narrative   Review of Systems: Review of Systems  Constitutional: Negative for fever and malaise/fatigue.  Eyes: Positive for blurred vision.  Respiratory: Positive for cough. Negative for shortness of breath and wheezing.   Cardiovascular: Negative for chest pain, claudication and leg swelling.  Gastrointestinal:  Negative for abdominal pain.  Genitourinary: Positive for frequency.  Musculoskeletal: Negative for myalgias.  Neurological: Negative for headaches.  Endo/Heme/Allergies: Positive for polydipsia.  Psychiatric/Behavioral: Negative for depression.     Objective:  Physical Exam: Filed Vitals:   02/23/16 1439  BP: 127/81  Pulse: 92  Temp: 98.7 F (37.1 C)  TempSrc: Oral  Height: '6\' 3"'$  (1.905 m)  Weight: 219 lb 6.4 oz (99.519 kg)  SpO2: 100%   Physical Exam  Constitutional: He is well-developed, well-nourished, and in no distress.  Eyes: Conjunctivae are normal.  Cardiovascular: Normal rate, regular rhythm and intact distal pulses.   Pulmonary/Chest: Effort normal and breath sounds normal. He has no wheezes.  Abdominal: Soft. Bowel sounds are normal.  Musculoskeletal: He exhibits no edema.  Skin:  Diffuse rash over extensor surfaces  Nursing note and vitals reviewed.   Assessment & Plan:  Case discussed with Dr. Dareen Piano  Essential hypertension HPI: Doing well, no issues with lisinopril.  A: Essential HTN, well controlled  P: Repeat BMP>>> K+5.5, called patient will have him stop lisinopril for 1 week and repeat Labs Starting Invokana for DM, may help with HTN.  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  He wants to avoid injections and remain on oral hypoglycemic agents.  He does have some polyuria and polydispia.  A: Uncontrolled Type 2 DM without insulin  P: Continue metformin and glipizide Add Invokana, will titrate up to '300mg'$  a day. If still not controlled we may need to reconsider an injection would prefer GLP-1 agonist (maybe once weekly bydueron)  Eczema HPI: Kenalog cream helped some but still has failed to resolve his eczema completely.  A:  Eczema  P: WIll increase potency of steroid, Rx Topicort cream  Tobacco abuse HPI: He has replapsed with his smoking, he is smoking about 1ppd.  He would like to quit and wants to try medication to help him quit.  A: Tobacco use disorder  P: Discussed Chantix, Rx 2 month course, instructed to gradually reduce use until he is able to quit completely.  Follow up in 2-3 months.  Spent dedicated 4 minutes to cessation counseling.  COPD with asthma ([place]) HPI: He reports advair makes him cough when he uses it, he did much better with dulera.  A: COPD with asthma  P: Will restart Dulera.  Medications Ordered Meds ordered this encounter  Medications  . varenicline (CHANTIX STARTING MONTH PAK) 0.5 MG X 11 & 1 MG X 42 tablet    Sig: Take one 0.5 mg tablet by mouth once daily for 3 days, then increase to one 0.5 mg tablet twice daily for 4 days, then increase to one 1 mg tablet twice daily.    Dispense:  53 tablet    Refill:  0  . varenicline (CHANTIX CONTINUING MONTH PAK) 1 MG tablet    Sig: Take 1 tablet (1 mg total) by mouth 2 (two) times daily.    Dispense:  60 tablet    Refill:  0  . DULERA 100-5 MCG/ACT AERO    Sig: Inhale 2 puffs into the lungs 2 (two) times daily as needed. Inhale 2 puffs by mouth into the lungs twice a day    Dispense:  1 Inhaler    Refill:  3  . canagliflozin (INVOKANA) 100 MG TABS tablet    Sig: Take 1 tablet (100 mg total) by mouth daily before breakfast.    Dispense:  30 tablet    Refill:  5  . desoximetasone (TOPICORT) 0.25 % cream    Sig: Apply 1 application topically 2 (two) times daily.    Dispense:  100 g    Refill:  2   Other Orders Orders Placed This Encounter  Procedures  . BMP8+Anion Gap  . BMP8+Anion Gap    Standing Status: Future     Number of Occurrences:      Standing Expiration Date: 03/26/2016  . Ambulatory referral to Podiatry    Referral Priority:  Routine    Referral Type:  Consultation    Referral Reason:  Specialty  Services Required    Requested Specialty:  Podiatry    Number of Visits Requested:  1  . POC Hbg A1C   Follow Up: No Follow-up on file.

## 2016-02-27 ENCOUNTER — Telehealth: Payer: Self-pay

## 2016-02-27 NOTE — Progress Notes (Signed)
[facility] Attending  Case discussed with Dr. Hoffman at the time of the visit.  We reviewed the resident's history and exam and pertinent patient test results.  I agree with the assessment, diagnosis, and plan of care documented in the resident's note.  

## 2016-02-27 NOTE — Telephone Encounter (Signed)
Spoke with patient, no further needs

## 2016-02-27 NOTE — Telephone Encounter (Signed)
Please call pt back regarding Chantix med.

## 2016-02-28 ENCOUNTER — Telehealth: Payer: Self-pay

## 2016-02-28 NOTE — Telephone Encounter (Signed)
Pt calls and states he cannot get the cream for his eczema, insurance co will not pay for it, he would like for you to order something else as he is itching really bad

## 2016-02-28 NOTE — Telephone Encounter (Signed)
Please call pt back regarding eczema cream.

## 2016-02-29 NOTE — Telephone Encounter (Signed)
Pt called back, requesting the nurse to call back regarding eczema cream.

## 2016-03-01 MED ORDER — ONETOUCH DELICA LANCETS FINE MISC: Status: DC

## 2016-03-01 MED ORDER — FLUOCINONIDE-E 0.05 % EX CREA: 1.0000 "application " | TOPICAL_CREAM | Freq: Two times a day (BID) | CUTANEOUS | Status: DC

## 2016-03-01 MED ORDER — GLUCOSE BLOOD VI STRP: ORAL_STRIP | Status: DC

## 2016-03-01 NOTE — Telephone Encounter (Signed)
Called patient to notify him of his coverage for diabetes testing supplies: The one touch verio meter is preferred and [facility] is in  Network so his cost will be 20% of the cost of the strips. He verbalized understanding

## 2016-03-01 NOTE — Telephone Encounter (Signed)
Pt has not picked up yet, he will call if there is a problem

## 2016-03-01 NOTE — Telephone Encounter (Signed)
I D/C Topicort, I am not sure what steroid cream his insurance will cover, I went ahead and prescribed Lidex.  If this one is not covered it may be best to ask for the preferred High potency steroid cream for his insurance.   -Cindee Salt

## 2016-03-01 NOTE — Telephone Encounter (Signed)
Informed pt this am, he will call if it is not covered

## 2016-03-23 ENCOUNTER — Ambulatory Visit (INDEPENDENT_AMBULATORY_CARE_PROVIDER_SITE_OTHER): Payer: Commercial Managed Care - HMO | Admitting: Podiatry

## 2016-03-23 ENCOUNTER — Encounter: Payer: Self-pay | Admitting: Podiatry

## 2016-03-23 VITALS — BP 144/94 | HR 89 | Resp 14

## 2016-03-23 DIAGNOSIS — M79676 Pain in unspecified toe(s): Secondary | ICD-10-CM

## 2016-03-23 DIAGNOSIS — B351 Tinea unguium: Secondary | ICD-10-CM | POA: Diagnosis not present

## 2016-03-23 DIAGNOSIS — E1140 Type 2 diabetes mellitus with diabetic neuropathy, unspecified: Secondary | ICD-10-CM | POA: Diagnosis not present

## 2016-03-23 NOTE — Progress Notes (Addendum)
   Subjective:    Patient ID: Richard [name], male    DOB: 08-25-1961, 55 y.o.   MRN: [id number]  HPI this patient presents to the office with chief complaint of painful long thick nails. Patient states she has painful thick nails, which hurt walking and wearing his shoes. He states he lived in [place] and was seen every 3 months for his nails. He also presents the office having a thick, blackened eschar looking lesion on the fifth top of his right and his left foot. This patient is diabetic with neuropathy. He presents the office today for an evaluation of his feet and treatment of his painful nails    Review of Systems  All other systems reviewed and are negative.      Objective:   Physical Exam GENERAL APPEARANCE: Alert, conversant. Appropriately groomed. No acute distress.  VASCULAR: Pedal pulses are  palpable at  [facility] and PT bilateral.  Capillary refill time is immediate to all digits,  Normal temperature gradient.  Digital hair growth is present bilateral  NEUROLOGIC: sensation is absent  to 5.07 monofilament at 5/5 sites bilateral.  Light touch is intact bilateral, Muscle strength normal.  MUSCULOSKELETAL: acceptable muscle strength, tone and stability bilateral.  Intrinsic muscluature intact bilateral.  Rectus appearance of foot and digits noted bilateral.   DERMATOLOGIC: skin color, texture, and turgor are within normal limits.  No preulcerative lesions or ulcers  are seen, no interdigital maceration noted.  No open lesions present.   No drainage noted. Thickened eschar appearing skin on dorsum of both feet.  NAILS  Thick disfigured discolored nails both feet.         Assessment & Plan:  Onychomycosis B/L  Diabetes with neuropathy.  IE  Debridement of nails B/L  ROV  3 months   Gardiner Barefoot DPM

## 2016-03-26 ENCOUNTER — Ambulatory Visit (INDEPENDENT_AMBULATORY_CARE_PROVIDER_SITE_OTHER): Payer: Medicare HMO | Admitting: Dietician

## 2016-03-26 VITALS — Wt 218.4 lb

## 2016-03-26 DIAGNOSIS — Z7984 Long term (current) use of oral hypoglycemic drugs: Secondary | ICD-10-CM

## 2016-03-26 DIAGNOSIS — E1165 Type 2 diabetes mellitus with hyperglycemia: Secondary | ICD-10-CM | POA: Diagnosis not present

## 2016-03-26 DIAGNOSIS — Z713 Dietary counseling and surveillance: Secondary | ICD-10-CM | POA: Diagnosis not present

## 2016-03-26 DIAGNOSIS — E11319 Type 2 diabetes mellitus with unspecified diabetic retinopathy without macular edema: Secondary | ICD-10-CM

## 2016-03-26 NOTE — Progress Notes (Signed)
  Medical Nutrition Therapy:  Appt start time: 845 end time:  [id number] Visit # 2  Assessment:  Primary concerns today: blood sugar control via meal planning  Richard [name] is here for his follow up visit. He did not bring his meter. He reports eating more vegetables and using less sugar. He is on his fourth week of chantix for quitting smoking and is smoking 3-4 cigarettes per day.Marland Kitchen He describes his current meal plan as more vegetables now and less starchy foods, but maybe too much rice and pasta still. He wants his A1C to be 7 or less. He was signed up for [employer] today and provided with a list of locations closest to him.   ANTHROPOMETRICS: weight-218.4#, height-75", BMI-27, IBW-196- 216# SLEEP:not through the night for the past few nights.  MEDICATIONS:he reports adherence to max dose metformin & glipizide and invokana BLOOD SUGAR:a1c 9.9% which is improved. He reports blood sugars are 199-245 recently and he check them twice daily DIETARY INTAKE: Usual eating pattern includes 3 meals and 2 snacks per day.   24-hr recall:  Wakes at 5 AM- has 2 cups of coffee with 2-3 teaspoons sugar each and a good 1/4 cup creamer (32 g carb) B ( 8-9 AM): eggs, bacon, toast x2 or cold cereal and  12 oz milk or his 2nd cup coffee  L ( 1 PM): leftovers, pasta or rice, meat, vegetables, sugar free drink ( ~ 70-80 grams carb)  Snk ( 3-4 PM): cheese doodles sugar free drink (~ 100 g carb)  D (7  PM): fried chicken wings x6, rib eye steak or pork chop,  1 cup or more rice or pasta, pease, greens, green beans, broccoli (~ 90 g carb)  Snk ( PM): has not been snacking of chips, sugar free drink   Usual physical activity: ADLs and light walking 15-30 minutes most days  Estimated energy needs: 2200-2500 calories 275-300 g carbohydrates   Progress Towards Goal(s):  In progress.   Nutritional Diagnosis:  NI-5.8.2 Excessive carbohydrate intake As related to his snack choices and sugar intake improving As evidenced  by his 24 horus recall and report of lower blood sugars.    Intervention:  Nutrition education about interpretation of self monitoring results, optional carbohydrates and portion sizes.   Coordination of care: may benefit from visit with pharmacist  Teaching Method Utilized: Visual,, Auditory,Hands on  Handouts given during visit include: AVS Barriers to learning/adherence to lifestyle change:  None noted today Demonstrated degree of understanding via:  Teach Back   Monitoring/Evaluation:  Dietary intake, exercise,meter, and body weight in 4 week(s).

## 2016-03-26 NOTE — Patient Instructions (Signed)
Try to eat more vegetables and protein than rice and pasta.   Chicken wings-  Stewed or baked it good- may want to eat 6-8.  Sleep is important- suggest you keep a check on it.  Silver sneakers- have fun!!!

## 2016-04-12 ENCOUNTER — Encounter: Payer: Self-pay | Admitting: Cardiology

## 2016-04-12 ENCOUNTER — Ambulatory Visit (INDEPENDENT_AMBULATORY_CARE_PROVIDER_SITE_OTHER): Payer: Medicare HMO | Admitting: Cardiology

## 2016-04-12 VITALS — BP 124/68 | HR 89 | Ht 75.0 in | Wt 218.0 lb

## 2016-04-12 DIAGNOSIS — E11311 Type 2 diabetes mellitus with unspecified diabetic retinopathy with macular edema: Secondary | ICD-10-CM

## 2016-04-12 DIAGNOSIS — J4489 Other specified chronic obstructive pulmonary disease: Secondary | ICD-10-CM

## 2016-04-12 DIAGNOSIS — E1165 Type 2 diabetes mellitus with hyperglycemia: Secondary | ICD-10-CM

## 2016-04-12 DIAGNOSIS — J45909 Unspecified asthma, uncomplicated: Secondary | ICD-10-CM

## 2016-04-12 DIAGNOSIS — I10 Essential (primary) hypertension: Secondary | ICD-10-CM | POA: Diagnosis not present

## 2016-04-12 DIAGNOSIS — I2510 Atherosclerotic heart disease of native coronary artery without angina pectoris: Secondary | ICD-10-CM | POA: Diagnosis not present

## 2016-04-12 DIAGNOSIS — J449 Chronic obstructive pulmonary disease, unspecified: Secondary | ICD-10-CM

## 2016-04-12 NOTE — Assessment & Plan Note (Signed)
Mild CAD at cath 09/26/15 No chest pain

## 2016-04-12 NOTE — Progress Notes (Signed)
04/12/2016 Richard [name]   01/02/1961  [id number]  Primary Physician [name], DO Primary Cardiologist: Dr Irish Lack  HPI:  55 y/o AA male from [facility], recently moved here. He has a history of HTN, COPD, and HLD. He was admitted to [facility] 09/26/15 with chest pain. He had multiple cardiac risk factors and his EKG had questionable anterior ST changes. It was decided to proceed with coronary angiogram. This was done 09/26/15 by Dr Irish Lack and showed mild non obstructive CAD. Echo showed an EF of 50-55%. Hgb A1c was 10. He is here for a 6 month f/u. He has arranged for a PCP and Dr Heber Buckingham is following his DM, HTN, and COPD. The pt denies chest pain. He says he has cut way back on smoking with Chantix. He is eating well and taking his medications though I see he is not on Lipitor, he thinks Dr Heber [place] stopped it.      Current Outpatient Prescriptions  Medication Sig Dispense Refill  . albuterol (PROAIR HFA) 108 (90 Base) MCG/ACT inhaler Inhale 1 puff into the lungs every 6 (six) hours as needed for wheezing or shortness of breath. 1 Inhaler 3  . aspirin EC 81 MG EC tablet Take 1 tablet (81 mg total) by mouth daily. 30 tablet 3  . Blood Glucose Monitoring Suppl (ONETOUCH VERIO FLEX SYSTEM) w/Device KIT 1 each by Does not apply route 2 (two) times daily. 1 kit 0  . canagliflozin (INVOKANA) 100 MG TABS tablet Take 1 tablet (100 mg total) by mouth daily before breakfast. 30 tablet 5  . DULERA 100-5 MCG/ACT AERO Inhale 2 puffs into the lungs 2 (two) times daily as needed. Inhale 2 puffs by mouth into the lungs twice a day 1 Inhaler 3  . fluocinonide-emollient (LIDEX-E) 0.05 % cream Apply 1 application topically 2 (two) times daily. D/C Topicort 60 g 2  . glipiZIDE (GLUCOTROL) 10 MG tablet Take 10 mg by mouth 2 (two) times daily.    Marland Kitchen glucose blood (ONETOUCH VERIO) test strip Check blood sugar 1 time a day 50 each 5  . latanoprost (XALATAN) 0.005 % ophthalmic solution INSTILL 1 DROP INTO BOTH  EYES IN THE EVENING  11  . lisinopril (PRINIVIL,ZESTRIL) 10 MG tablet Take 10 mg every night (Patient taking differently: daily. Take 10 mg every night) 30 tablet 6  . LYRICA 100 MG capsule     . metFORMIN (GLUCOPHAGE) 1000 MG tablet Take 1,000 mg by mouth 2 (two) times daily with a meal.    . nicotine (NICODERM CQ - DOSED IN MG/24 HR) 7 mg/24hr patch Place 1 patch (7 mg total) onto the skin daily. 28 patch 3  . ONETOUCH DELICA LANCETS FINE MISC Check blood sugar 1 time a day 100 each 3  . pregabalin (LYRICA) 75 MG capsule Take 75 mg by mouth 2 (two) times daily.    . varenicline (CHANTIX CONTINUING MONTH PAK) 1 MG tablet Take 1 tablet (1 mg total) by mouth 2 (two) times daily. 60 tablet 0  . varenicline (CHANTIX STARTING MONTH PAK) 0.5 MG X 11 & 1 MG X 42 tablet Take one 0.5 mg tablet by mouth once daily for 3 days, then increase to one 0.5 mg tablet twice daily for 4 days, then increase to one 1 mg tablet twice daily. 53 tablet 0   No current facility-administered medications for this visit.    No Known Allergies  Social History   Social History  . Marital Status: Single  Spouse Name: N/A  . Number of Children: N/A  . Years of Education: N/A   Occupational History  . Not on file.   Social History Main Topics  . Smoking status: Current Some Day Smoker -- 1.00 packs/day    Types: Cigarettes  . Smokeless tobacco: Never Used     Comment: 5-6 CIAGARETTES A DAY  . Alcohol Use: No  . Drug Use: No  . Sexual Activity: Yes   Other Topics Concern  . Not on file   Social History Narrative     Review of Systems: General: negative for chills, fever, night sweats or weight changes.  Cardiovascular: negative for chest pain, dyspnea on exertion, edema, orthopnea, palpitations, paroxysmal nocturnal dyspnea or shortness of breath Dermatological: negative for rash Respiratory: negative for cough or wheezing Urologic: negative for hematuria Abdominal: negative for nausea, vomiting,  diarrhea, bright red blood per rectum, melena, or hematemesis Neurologic: negative for visual changes, syncope, or dizziness All other systems reviewed and are otherwise negative except as noted above.    Blood pressure 124/68, pulse 89, height '6\' 3"'$  (1.905 m), weight 218 lb (98.884 kg).  General appearance: alert, cooperative and no distress Neck: no carotid bruit and no JVD Lungs: clear to auscultation bilaterally and decreased breath sounds Heart: regular rate and rhythm and short 2/6 systolic murmur AOV, LSB Abdomen: soft, non-tender; bowel sounds normal; no masses,  no organomegaly Extremities: extremities normal, atraumatic, no cyanosis or edema Skin: Skin color, texture, turgor normal. 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 He had a thick AOV on echo- no stenosis. I asked him to check with Dr Heber  about the Lipitor.  Kerin Ransom PA-C 04/12/2016 9:21 AM

## 2016-04-12 NOTE — Patient Instructions (Signed)
Your physician wants you to follow-up in: 1 Year. You will receive a reminder letter in the mail two months in advance. If you don't receive a letter, please call our office to schedule the follow-up appointment.  

## 2016-04-12 NOTE — Assessment & Plan Note (Signed)
Followed by PCP

## 2016-04-12 NOTE — Assessment & Plan Note (Addendum)
Stable-on Chantix for smoking cessation

## 2016-04-12 NOTE — Assessment & Plan Note (Signed)
Controlled.  

## 2016-04-18 DIAGNOSIS — R6889 Other general symptoms and signs: Secondary | ICD-10-CM | POA: Diagnosis not present

## 2016-04-18 DIAGNOSIS — H40001 Preglaucoma, unspecified, right eye: Secondary | ICD-10-CM | POA: Diagnosis not present

## 2016-04-18 DIAGNOSIS — H524 Presbyopia: Secondary | ICD-10-CM | POA: Diagnosis not present

## 2016-04-18 DIAGNOSIS — H401122 Primary open-angle glaucoma, left eye, moderate stage: Secondary | ICD-10-CM | POA: Diagnosis not present

## 2016-04-19 DIAGNOSIS — Z0100 Encounter for examination of eyes and vision without abnormal findings: Secondary | ICD-10-CM | POA: Diagnosis not present

## 2016-04-27 ENCOUNTER — Ambulatory Visit: Payer: Medicare HMO | Admitting: Dietician

## 2016-05-04 ENCOUNTER — Encounter (INDEPENDENT_AMBULATORY_CARE_PROVIDER_SITE_OTHER): Payer: Self-pay

## 2016-05-04 ENCOUNTER — Encounter: Payer: Self-pay | Admitting: Dietician

## 2016-05-04 ENCOUNTER — Ambulatory Visit (INDEPENDENT_AMBULATORY_CARE_PROVIDER_SITE_OTHER): Payer: Commercial Managed Care - HMO | Admitting: Dietician

## 2016-05-04 ENCOUNTER — Other Ambulatory Visit: Payer: Self-pay | Admitting: *Deleted

## 2016-05-04 DIAGNOSIS — Z713 Dietary counseling and surveillance: Secondary | ICD-10-CM | POA: Diagnosis not present

## 2016-05-04 DIAGNOSIS — E1140 Type 2 diabetes mellitus with diabetic neuropathy, unspecified: Secondary | ICD-10-CM

## 2016-05-04 DIAGNOSIS — E11311 Type 2 diabetes mellitus with unspecified diabetic retinopathy with macular edema: Secondary | ICD-10-CM

## 2016-05-04 DIAGNOSIS — R6889 Other general symptoms and signs: Secondary | ICD-10-CM | POA: Diagnosis not present

## 2016-05-04 DIAGNOSIS — E1165 Type 2 diabetes mellitus with hyperglycemia: Secondary | ICD-10-CM | POA: Diagnosis not present

## 2016-05-04 NOTE — Progress Notes (Signed)
  Medical Nutrition Therapy:  Appt start time: 1450 end time:  [id number] Visit # 3  Assessment:  Primary concerns today: blood sugar control via meal planning  [name] is here for his follow up visit. He brought his meter. He reports neuropathy in feet bothering him . He says he is eating more vegetables, fruit,brown rice and bread and less sugar, sweets. His diet recall is not consistent with this. . He got his silver sneakers packet in the mail and his goal this month is to check it out.   ANTHROPOMETRICS: weight-221.7#, no change BLOOD SUGAR:Meter download shows improvement ,fasting blood sugar 170-190, midday 109-130 DIETARY INTAKE: Usual eating pattern includes 3 meals and 2 snacks per day.   24-hr recall:  B ( 8-9 AM):large glass of orange juice with his medicine L ( 1 PM): 2 cups tater tots with hot sauce, water   D (7  PM): 2 hot dogs, water   Usual physical activity: ADLs and light walking 15-30 minutes most days  Estimated energy needs: 2200-2500 calories 275-300 g carbohydrates   Progress Towards Goal(s):  In progress.   Nutritional Diagnosis:  NI-5.8.2 Excessive carbohydrate intake As related to his snack choices and sugar intake improving As evidenced by his meter download showing trend in April 180-300 and now 100-180.    Intervention:  Nutrition education about interpretation of self monitoring results, reading labels for carbs, importance of eating whole grains, legumes.   Coordination of care: may benefit from visit with pharmacist  Teaching Method Utilized: Visual,, Auditory,Hands on  Handouts given during visit include: AVS Barriers to learning/adherence to lifestyle change:  None noted today Demonstrated degree of understanding via:  Teach Back   Monitoring/Evaluation:  Dietary intake, exercise,meter, and body weight in 6 week(s).

## 2016-05-04 NOTE — Patient Instructions (Addendum)
for foot pain.   Keep up the good work trying to put vegetables in your diet and cutting back on starch and sugar.   See you in September with your meter.

## 2016-05-07 ENCOUNTER — Other Ambulatory Visit: Payer: Self-pay | Admitting: Internal Medicine

## 2016-05-07 DIAGNOSIS — E11319 Type 2 diabetes mellitus with unspecified diabetic retinopathy without macular edema: Secondary | ICD-10-CM

## 2016-05-07 DIAGNOSIS — E1165 Type 2 diabetes mellitus with hyperglycemia: Principal | ICD-10-CM

## 2016-05-07 NOTE — Telephone Encounter (Signed)
Patient requesting RX to be sent to [facility] mail order

## 2016-05-07 NOTE — Telephone Encounter (Signed)
Desoximetasone canagliflozin (INVOKANA) 100 MG TABS tablet LYRICA 100 MG capsule Accucheck meter Pt needs these medications

## 2016-05-08 MED ORDER — CANAGLIFLOZIN 100 MG PO TABS: 100.0000 mg | ORAL_TABLET | Freq: Every day | ORAL | 5 refills | Status: DC

## 2016-05-08 MED ORDER — FLUOCINONIDE-E 0.05 % EX CREA: 1.0000 "application " | TOPICAL_CREAM | Freq: Two times a day (BID) | CUTANEOUS | 2 refills | Status: DC

## 2016-05-08 MED ORDER — ONETOUCH VERIO FLEX SYSTEM W/DEVICE KIT: 1.0000 | PACK | Freq: Two times a day (BID) | 0 refills | Status: DC

## 2016-05-08 MED ORDER — PREGABALIN 75 MG PO CAPS
75.0000 mg | ORAL_CAPSULE | Freq: Two times a day (BID) | ORAL | 2 refills | Status: DC
Start: 2016-05-08 — End: 2016-05-16

## 2016-05-08 NOTE — Telephone Encounter (Signed)
Lyrica rx called to [place].

## 2016-05-08 NOTE — Telephone Encounter (Signed)
Will you phone in the Lyrica to his pharmacy

## 2016-05-14 ENCOUNTER — Telehealth: Payer: Self-pay

## 2016-05-14 NOTE — Telephone Encounter (Signed)
Lm for rtc 

## 2016-05-14 NOTE — Telephone Encounter (Signed)
Needs to speak with a nurse regarding Lyrica.

## 2016-05-15 ENCOUNTER — Other Ambulatory Visit: Payer: Self-pay | Admitting: *Deleted

## 2016-05-15 NOTE — Telephone Encounter (Signed)
Pt called back, requesting the nurse to speak with a nurse about Lyrica.

## 2016-05-15 NOTE — Telephone Encounter (Signed)
appt made

## 2016-05-16 ENCOUNTER — Ambulatory Visit (INDEPENDENT_AMBULATORY_CARE_PROVIDER_SITE_OTHER): Payer: Commercial Managed Care - HMO | Admitting: Internal Medicine

## 2016-05-16 ENCOUNTER — Encounter: Payer: Self-pay | Admitting: Internal Medicine

## 2016-05-16 VITALS — BP 132/73 | HR 92 | Temp 98.4°F | Wt 217.9 lb

## 2016-05-16 DIAGNOSIS — Z7984 Long term (current) use of oral hypoglycemic drugs: Secondary | ICD-10-CM

## 2016-05-16 DIAGNOSIS — E1142 Type 2 diabetes mellitus with diabetic polyneuropathy: Secondary | ICD-10-CM

## 2016-05-16 DIAGNOSIS — E1165 Type 2 diabetes mellitus with hyperglycemia: Principal | ICD-10-CM

## 2016-05-16 DIAGNOSIS — E1139 Type 2 diabetes mellitus with other diabetic ophthalmic complication: Secondary | ICD-10-CM | POA: Diagnosis not present

## 2016-05-16 DIAGNOSIS — E11311 Type 2 diabetes mellitus with unspecified diabetic retinopathy with macular edema: Secondary | ICD-10-CM

## 2016-05-16 MED ORDER — PREGABALIN 100 MG PO CAPS: 100.0000 mg | ORAL_CAPSULE | Freq: Three times a day (TID) | ORAL | 3 refills | Status: DC

## 2016-05-16 NOTE — Telephone Encounter (Signed)
Lyrica 100 mg three times daily phoned in to [facility] mail order pharmacy.[place], Darlene Cassady8/9/20179:22 AM     (Length of call 39mins)

## 2016-05-16 NOTE — Patient Instructions (Signed)
Thank you for your visit today Please take the lyrica 3 times a day as you were.  If you notice your blood sugar dropping to < 70 in the morning fasting, or after meals, then cut the glipizide to 10 mg daily from twice a day. I will not be making the change today but pl discuss it with your regular doctor.

## 2016-05-16 NOTE — Progress Notes (Signed)
Medicine attending: Medical history, presenting problems, physical findings, and medications, reviewed with resident physician Dr Burgess Estelle on the day of the patient visit and I concur with his evaluation and management plan. Patient in for med dose adjustment.

## 2016-05-16 NOTE — Progress Notes (Signed)
   CC: medication change HPI: Mr.Conlan Cheslock is a 55 y.o. man with PMH noted below here to change the lyrica dose as it was incorrect  Please see Problem List/A&P for the status of the patient's chronic medical problems   Past Medical History:  Diagnosis Date  . Asthma   . Diabetes mellitus without complication ([place])    diagnosed in 2013  . Essential hypertension   . Glaucoma    bilateral, surgery on left eye but not right  . Hyperlipidemia   . Neuromuscular disorder (HCC)    neuropathy in feet  . 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 Substance abuse    crack - recovered x 30 yrs    Review of Systems: Denies fevers, chills Has diabetic neuropathy  Physical Exam: Vitals:   05/16/16 0840  BP: 132/73  Pulse: 92  Temp: 98.4 F (36.9 C)  TempSrc: Oral  SpO2: 100%  Weight: 217 lb 14.4 oz (98.8 kg)    General: A&O, in NAD CV: RRR, normal s1, s2, no m/r/g, Resp: equal and symmetric breath sounds, no wheezing or crackles  Abdomen: soft, nontender, nondistended, +BS   Assessment & Plan:   See encounters tab for problem based medical decision making. Patient discussed with Dr. Beryle Beams

## 2016-05-16 NOTE — Assessment & Plan Note (Signed)
Patient is doing well. He is here to change the lyrica dose as he was getting 100 mg TID from [facility], but when he moved here he was getting 75 mg BID. He says he has severe peripheral neuropathy and the 100 mg TID helps him.  -Changed Lyrica to 100 mg TID -I also explained to him that he was on glipizide 10 mg BID, which was a little high in my opinion and has high risk of hypoglycemic events, especially given that he was started on Invokana, and is also on metformin. He mentioned that his lowest CBG is 104 and he checkes them BID. I explained that if he notices low CBGs < 70,then he should cut back the glipizide to once a day, but I am not changing the dose today, as he has appt next month with his PCP.

## 2016-05-18 ENCOUNTER — Other Ambulatory Visit (INDEPENDENT_AMBULATORY_CARE_PROVIDER_SITE_OTHER): Payer: Commercial Managed Care - HMO

## 2016-05-18 DIAGNOSIS — E875 Hyperkalemia: Secondary | ICD-10-CM

## 2016-05-18 LAB — BASIC METABOLIC PANEL
Anion gap: 9 (ref 5–15)
BUN: 13 mg/dL (ref 6–20)
CALCIUM: 9.4 mg/dL (ref 8.9–10.3)
CO2: 23 mmol/L (ref 22–32)
Chloride: 102 mmol/L (ref 101–111)
Creatinine, Ser: 0.97 mg/dL (ref 0.61–1.24)
Glucose, Bld: 272 mg/dL — ABNORMAL HIGH (ref 65–99)
Potassium: 4.4 mmol/L (ref 3.5–5.1)
SODIUM: 134 mmol/L — AB (ref 135–145)

## 2016-06-08 NOTE — Addendum Note (Signed)
Addended by: Orson Gear on: 06/08/2016 01:37 PM   Modules accepted: Orders

## 2016-06-12 ENCOUNTER — Encounter: Payer: Self-pay | Admitting: Podiatry

## 2016-06-12 ENCOUNTER — Ambulatory Visit (INDEPENDENT_AMBULATORY_CARE_PROVIDER_SITE_OTHER): Payer: Commercial Managed Care - HMO | Admitting: Podiatry

## 2016-06-12 DIAGNOSIS — L84 Corns and callosities: Secondary | ICD-10-CM

## 2016-06-12 DIAGNOSIS — M79676 Pain in unspecified toe(s): Secondary | ICD-10-CM | POA: Diagnosis not present

## 2016-06-12 DIAGNOSIS — E1140 Type 2 diabetes mellitus with diabetic neuropathy, unspecified: Secondary | ICD-10-CM

## 2016-06-12 DIAGNOSIS — B351 Tinea unguium: Secondary | ICD-10-CM

## 2016-06-12 DIAGNOSIS — R6889 Other general symptoms and signs: Secondary | ICD-10-CM | POA: Diagnosis not present

## 2016-06-12 NOTE — Progress Notes (Signed)
   Subjective:    Patient ID: Richard [name], male    DOB: 1961-08-14, 55 y.o.   MRN: [id number]  HPI this patient presents to the office with chief complaint of painful long thick nails. Patient states she has painful thick nails, which hurt walking and wearing his shoes. He states he lived in [place] and was seen every 3 months for his nails. . This patient is diabetic with neuropathy. He presents the office today for an evaluation of his feet and treatment of his painful nails.  He also says he has developed a painful callus under the ball of his right foot.  He denies drainage from this area.  He is diabetic.    Review of Systems  All other systems reviewed and are negative.      Objective:   Physical Exam GENERAL APPEARANCE: Alert, conversant. Appropriately groomed. No acute distress.  VASCULAR: Pedal pulses are  palpable at  [facility] and PT bilateral.  Capillary refill time is immediate to all digits,  Normal temperature gradient.   NEUROLOGIC: sensation is absent  to 5.07 monofilament at 5/5 sites bilateral.  Light touch is intact bilateral, Muscle strength normal.  MUSCULOSKELETAL: acceptable muscle strength, tone and stability bilateral.  Intrinsic muscluature intact bilateral.  Rectus appearance of foot and digits noted bilateral.   DERMATOLOGIC: skin color, texture, and turgor are within normal limits.  No preulcerative lesions or ulcers  are seen, no interdigital maceration noted.  No open lesions present.   No drainage noted.   Plantar tyloma sub 2,3 metatarsal head right foot.  NAILS  Thick disfigured discolored nails both feet.         Assessment & Plan:  Onychomycosis B/L  Diabetes with neuropathy.    Debridement of nails B/L  ROV  Debridement of tyloma right foot.  No drainage or ulceration noted.RTC 3 months.   Gardiner Barefoot DPM

## 2016-06-19 ENCOUNTER — Other Ambulatory Visit: Payer: Self-pay | Admitting: *Deleted

## 2016-06-19 MED ORDER — DULERA 100-5 MCG/ACT IN AERO: 2.0000 | INHALATION_SPRAY | Freq: Two times a day (BID) | RESPIRATORY_TRACT | 5 refills | Status: DC | PRN

## 2016-06-21 NOTE — Addendum Note (Signed)
Addended by: Truddie Crumble on: 06/21/2016 04:49 PM   Modules accepted: Orders

## 2016-06-22 ENCOUNTER — Ambulatory Visit: Payer: Commercial Managed Care - HMO | Admitting: Podiatry

## 2016-06-27 ENCOUNTER — Telehealth: Payer: Self-pay | Admitting: *Deleted

## 2016-06-27 NOTE — Telephone Encounter (Signed)
Fax from [facility] too expensive- pt requesting a cheaper medication. Thanks

## 2016-06-28 ENCOUNTER — Ambulatory Visit (HOSPITAL_COMMUNITY)
Admission: RE | Admit: 2016-06-28 | Discharge: 2016-06-28 | Disposition: A | Discharge status: Home / Self Care | Payer: Commercial Managed Care - HMO | Source: Ambulatory Visit | Attending: Internal Medicine | Admitting: Internal Medicine

## 2016-06-28 ENCOUNTER — Ambulatory Visit (INDEPENDENT_AMBULATORY_CARE_PROVIDER_SITE_OTHER): Payer: Commercial Managed Care - HMO | Admitting: Internal Medicine

## 2016-06-28 ENCOUNTER — Other Ambulatory Visit: Payer: Self-pay

## 2016-06-28 ENCOUNTER — Encounter: Payer: Medicare HMO | Admitting: Internal Medicine

## 2016-06-28 ENCOUNTER — Other Ambulatory Visit: Payer: Self-pay | Admitting: *Deleted

## 2016-06-28 ENCOUNTER — Ambulatory Visit (INDEPENDENT_AMBULATORY_CARE_PROVIDER_SITE_OTHER): Payer: Commercial Managed Care - HMO | Admitting: Dietician

## 2016-06-28 ENCOUNTER — Encounter: Payer: Self-pay | Admitting: Internal Medicine

## 2016-06-28 VITALS — BP 145/81 | HR 79 | Temp 98.4°F | Ht 75.0 in | Wt 219.8 lb

## 2016-06-28 DIAGNOSIS — R079 Chest pain, unspecified: Secondary | ICD-10-CM

## 2016-06-28 DIAGNOSIS — R072 Precordial pain: Secondary | ICD-10-CM | POA: Diagnosis not present

## 2016-06-28 DIAGNOSIS — R748 Abnormal levels of other serum enzymes: Secondary | ICD-10-CM | POA: Diagnosis not present

## 2016-06-28 DIAGNOSIS — J4489 Other specified chronic obstructive pulmonary disease: Secondary | ICD-10-CM

## 2016-06-28 DIAGNOSIS — L309 Dermatitis, unspecified: Secondary | ICD-10-CM

## 2016-06-28 DIAGNOSIS — E1140 Type 2 diabetes mellitus with diabetic neuropathy, unspecified: Secondary | ICD-10-CM | POA: Diagnosis not present

## 2016-06-28 DIAGNOSIS — F17210 Nicotine dependence, cigarettes, uncomplicated: Secondary | ICD-10-CM

## 2016-06-28 DIAGNOSIS — Z7984 Long term (current) use of oral hypoglycemic drugs: Secondary | ICD-10-CM | POA: Diagnosis not present

## 2016-06-28 DIAGNOSIS — E1165 Type 2 diabetes mellitus with hyperglycemia: Secondary | ICD-10-CM

## 2016-06-28 DIAGNOSIS — E11311 Type 2 diabetes mellitus with unspecified diabetic retinopathy with macular edema: Secondary | ICD-10-CM

## 2016-06-28 DIAGNOSIS — I10 Essential (primary) hypertension: Secondary | ICD-10-CM | POA: Diagnosis not present

## 2016-06-28 DIAGNOSIS — R6889 Other general symptoms and signs: Secondary | ICD-10-CM | POA: Diagnosis not present

## 2016-06-28 DIAGNOSIS — R0789 Other chest pain: Secondary | ICD-10-CM | POA: Insufficient documentation

## 2016-06-28 DIAGNOSIS — Z79899 Other long term (current) drug therapy: Secondary | ICD-10-CM

## 2016-06-28 DIAGNOSIS — J449 Chronic obstructive pulmonary disease, unspecified: Secondary | ICD-10-CM

## 2016-06-28 DIAGNOSIS — Z713 Dietary counseling and surveillance: Secondary | ICD-10-CM

## 2016-06-28 DIAGNOSIS — I2510 Atherosclerotic heart disease of native coronary artery without angina pectoris: Secondary | ICD-10-CM | POA: Diagnosis not present

## 2016-06-28 DIAGNOSIS — E119 Type 2 diabetes mellitus without complications: Secondary | ICD-10-CM

## 2016-06-28 HISTORY — DX: Chest pain, unspecified: R07.9

## 2016-06-28 LAB — GLUCOSE, CAPILLARY: Glucose-Capillary: 163 mg/dL — ABNORMAL HIGH (ref 65–99)

## 2016-06-28 LAB — POCT GLYCOSYLATED HEMOGLOBIN (HGB A1C): Hemoglobin A1C: 8.3

## 2016-06-28 MED ORDER — VARENICLINE TARTRATE 1 MG PO TABS: 1.0000 mg | ORAL_TABLET | Freq: Two times a day (BID) | ORAL | 0 refills | Status: DC

## 2016-06-28 MED ORDER — GLIPIZIDE 10 MG PO TABS: 10.0000 mg | ORAL_TABLET | Freq: Two times a day (BID) | ORAL | 1 refills | Status: DC

## 2016-06-28 MED ORDER — OMEPRAZOLE 40 MG PO CPDR: 40.0000 mg | DELAYED_RELEASE_CAPSULE | Freq: Every day | ORAL | 0 refills | Status: DC

## 2016-06-28 MED ORDER — VARENICLINE TARTRATE 0.5 MG X 11 & 1 MG X 42 PO MISC: ORAL | 0 refills | Status: DC

## 2016-06-28 MED ORDER — ATORVASTATIN CALCIUM 40 MG PO TABS: 40.0000 mg | ORAL_TABLET | Freq: Every day | ORAL | 3 refills | Status: DC

## 2016-06-28 MED ORDER — FLUTICASONE-SALMETEROL 100-50 MCG/DOSE IN AEPB: 1.0000 | INHALATION_SPRAY | Freq: Two times a day (BID) | RESPIRATORY_TRACT | 5 refills | Status: DC

## 2016-06-28 MED ORDER — OMEPRAZOLE 40 MG PO CPDR: 40.0000 mg | DELAYED_RELEASE_CAPSULE | Freq: Every day | ORAL | 1 refills | Status: DC

## 2016-06-28 MED ORDER — NAPROXEN 500 MG PO TABS: 500.0000 mg | ORAL_TABLET | Freq: Two times a day (BID) | ORAL | 0 refills | Status: DC

## 2016-06-28 MED ORDER — METFORMIN HCL 1000 MG PO TABS: 1000.0000 mg | ORAL_TABLET | Freq: Two times a day (BID) | ORAL | 3 refills | Status: DC

## 2016-06-28 NOTE — Telephone Encounter (Signed)
Requesting 90 day supply. Thanks 

## 2016-06-28 NOTE — Telephone Encounter (Signed)
Does not need 90 day supply of Naproxen, should be short term

## 2016-06-28 NOTE — Progress Notes (Addendum)
  Medical Nutrition Therapy:  Appt start time: 815 end time:  835 Visit # 4  Assessment:  Primary concerns today: blood sugar control via meal planning  Richard [name] is here for his follow up visit. He brought his meter. He reports he is exercising 2-3 times a week now using his silver sneakers. He says he is eating more vegetables, larger portions of fruit,brown rice and bread and less sugar, sweets.    ANTHROPOMETRICS: weight-219.7#, no change, waist circumference 42", goal is <40" for males.  BLOOD SUGAR:Meter download shows improvement ,fasting blood sugar  Still higher than daytime DIETARY INTAKE: Eating 2-3 banana a day and more rice and starchy foods.   Usual physical activity: ADLs and light walking 15-30 minutes most days  Estimated energy needs: 2200-2500 calories 275-300 g carbohydrates   Progress Towards Goal(s):  In progress.   Nutritional Diagnosis:  NI-5.8.2 Excessive carbohydrate intake As related to his snack choice and skipping meals improving As evidenced by his meter download and drop in A1C.    Intervention:  Nutrition education about interpretation of self monitoring results, importance of eating balanced meals and not skipping them    Coordination of care: may benefit from visit with pharmacist  Teaching Method Utilized: Visual,, Auditory,Hands on  Handouts given during visit include: AVS Barriers to learning/adherence to lifestyle change:  None noted today Demonstrated degree of understanding via:  Teach Back   Monitoring/Evaluation:  Dietary intake, exercise,meter, and body weight in 3 month(s).

## 2016-06-28 NOTE — Patient Instructions (Signed)
Notes from visit with Diabetes Educator- Richard [name]  Your A1C is closer to your target of 7%.  Keep exercising- try to add one more day a week so you do not skip more than 2 days in a row.  Try to eat at least 3 balanced meals a day- ( quick and easy to grab ideas- peanut butter and banana sandwich or a milkshake or cheese and crackers or a bowl of high fiber cereal or yogurt)  Please make an appointment to see Richard Penny in 3 months ( December)

## 2016-06-28 NOTE — Telephone Encounter (Signed)
I sent a Rx for Advair

## 2016-06-28 NOTE — Patient Instructions (Addendum)
Notes from visit with Diabetes Educator- Richard [name]  Your A1C is closer to your target of 7%.  Keep exercising- try to add one more day a week so you do not skip more than 2 days in a row.  Try to eat at least 3 balanced meals a day- ( quick and easy to grab ideas- peanut butter and banana sandwich or a milkshake or cheese and crackers or a bowl of high fiber cereal or yogurt)  Please make an appointment to see Richard Penny in 3 months ( December)    Please drink more water while taking Invokanna.  I have ordered you you Omeprazole (Acid reducer) take this once a day 30 minutes before your eat.  I have ordered you Naproxen 500mg  take twice a day for your chest pain.

## 2016-06-29 ENCOUNTER — Ambulatory Visit: Payer: Medicare Other | Admitting: Podiatry

## 2016-06-29 LAB — CMP14 + ANION GAP
ALT: 33 IU/L (ref 0–44)
AST: 23 IU/L (ref 0–40)
Albumin/Globulin Ratio: 1.2 (ref 1.2–2.2)
Albumin: 4.4 g/dL (ref 3.5–5.5)
Alkaline Phosphatase: 132 IU/L — ABNORMAL HIGH (ref 39–117)
Anion Gap: 18 mmol/L (ref 10.0–18.0)
BUN/Creatinine Ratio: 13 (ref 9–20)
BUN: 13 mg/dL (ref 6–24)
Bilirubin Total: 0.3 mg/dL (ref 0.0–1.2)
CALCIUM: 9.5 mg/dL (ref 8.7–10.2)
CO2: 23 mmol/L (ref 18–29)
Chloride: 97 mmol/L (ref 96–106)
Creatinine, Ser: 1.01 mg/dL (ref 0.76–1.27)
GFR, EST AFRICAN AMERICAN: 95 mL/min/{1.73_m2} (ref >59)
GFR, EST NON AFRICAN AMERICAN: 82 mL/min/{1.73_m2} (ref >59)
GLUCOSE: 116 mg/dL — AB (ref 65–99)
Globulin, Total: 3.7 g/dL (ref 1.5–4.5)
Potassium: 5.4 mmol/L — ABNORMAL HIGH (ref 3.5–5.2)
Sodium: 138 mmol/L (ref 134–144)
TOTAL PROTEIN: 8.1 g/dL (ref 6.0–8.5)

## 2016-06-29 LAB — MAGNESIUM: Magnesium: 2 mg/dL (ref 1.6–2.3)

## 2016-07-02 DIAGNOSIS — E559 Vitamin D deficiency, unspecified: Secondary | ICD-10-CM | POA: Insufficient documentation

## 2016-07-02 NOTE — Addendum Note (Signed)
Addended by: [name] C on: 07/02/2016 04:00 PM   Modules accepted: Orders

## 2016-07-02 NOTE — Progress Notes (Signed)
[facility] Subjective:  HPI: Richard [name] is a 55 y.o. male with a PMH of HTN, DM, COPD with Asthma who presents for 3 month follow up of DM.  Please see problem based charting below for the status of his chronic medical problems.     Review of Systems: Review of Systems  Constitutional: Negative for fever.  Cardiovascular: Positive for chest pain. Negative for palpitations, claudication and leg swelling.  Gastrointestinal: Positive for heartburn. Negative for abdominal pain, constipation and diarrhea.  Neurological: Negative for headaches.    Objective:  Physical Exam: Vitals:   06/28/16 0857 06/28/16 1044  BP: 132/71 (!) 145/81  Pulse: 86 79  Temp: 98.4 F (36.9 C)   TempSrc: Oral   SpO2: 100%   Weight: 219 lb 12.8 oz (99.7 kg)   Height: 6\' 3"  (1.905 m)    Physical Exam  Cardiovascular: Normal rate and regular rhythm.   Pulmonary/Chest: Effort normal and breath sounds normal. He has no wheezes.  Abdominal: Soft. Bowel sounds are normal.  Musculoskeletal:  Tenderness to palpation of right anterior chest and sternum.  Skin:  Multiple areas of hyperpigmented raised skin over extensor surfaces of arms legs as well as back.  Nursing note and vitals reviewed.  Assessment & Plan:  Essential hypertension HPI: Taking lisinopril, no issues  A: Essential HTN Controlled  P: Continue lisinopril 10mg  daily   CAD in native artery HPI: He had stopped taking Atorvastatin, not sure why, does not recall myalgias, thought that I had told him to stop.  A: CAD  P: Resume Atorvastatin 40mg  daily  COPD with asthma ([place]) HPI: Dulera not covered by his insurance.  He has been using albuterol multiple times a week and sometimes multiple times a day.  A: COPD with Asthma  P: Change dulera to Advair, discussed importance of taking. Encouraged smoking cessation, will resume Chantix.  Uncontrolled type 2 diabetes mellitus with ophthalmic  complication, without long-term current use of insulin (HCC) HPI: Has been taking Metformin 1g BID and Glipizide 10mg  BID, as well as 100mg  of Invokana.  Occasionally feels light headed when standing that started after using invokana.  A: Uncontrolled Type 2 DM, with peripheral neuropathy, Improving  P: Continue current regimen.  Advised to increase water intake as invokana may be dehydrating him some.  Eczema A: Eczema  P: Kenalog cream was not effective, LIDEX cream also not effective.  Will refer to dermatology.  Chest pain Patient reports pain in right side of his chest, worse with movement also present at rest. Currently rates pain as a 3/10.  Hurts worse in the morning.  Has been going on for the last few days.  A: MSK chest pain  P: Pain is reproducible on exam.  Recommend rest and short course of naproxen.  Do not suspect cardiac cause as pain is atypical, he has a cardiac cath <1 year ago with only 10% stenosis.  I did obtain an EKG which shows a NSR with no ST changes or T wave abnormalities and is unchanged from his previous.   Medications Ordered Meds ordered this encounter  Medications  . DISCONTD: omeprazole (PRILOSEC) 40 MG capsule    Sig: Take 1 capsule (40 mg total) by mouth daily.    Dispense:  30 capsule    Refill:  1  . varenicline (CHANTIX STARTING MONTH PAK) 0.5 MG X 11 & 1 MG X 42 tablet    Sig: Take one 0.5 mg tablet by mouth once daily for  3 days, then increase to one 0.5 mg tablet twice daily for 4 days, then increase to one 1 mg tablet twice daily.    Dispense:  53 tablet    Refill:  0  . varenicline (CHANTIX CONTINUING MONTH PAK) 1 MG tablet    Sig: Take 1 tablet (1 mg total) by mouth 2 (two) times daily.    Dispense:  60 tablet    Refill:  0    Please start after starting month pack.  . naproxen (NAPROSYN) 500 MG tablet    Sig: Take 1 tablet (500 mg total) by mouth 2 (two) times daily with a meal.    Dispense:  60 tablet    Refill:  0  .  metFORMIN (GLUCOPHAGE) 1000 MG tablet    Sig: Take 1 tablet (1,000 mg total) by mouth 2 (two) times daily with a meal.    Dispense:  180 tablet    Refill:  3  . glipiZIDE (GLUCOTROL) 10 MG tablet    Sig: Take 1 tablet (10 mg total) by mouth 2 (two) times daily.    Dispense:  180 tablet    Refill:  1  . Fluticasone-Salmeterol (ADVAIR DISKUS) 100-50 MCG/DOSE AEPB    Sig: Inhale 1 puff into the lungs 2 (two) times daily.    Dispense:  60 each    Refill:  5    D/C dulera, please contact if Advair also too expensive.  Marland Kitchen atorvastatin (LIPITOR) 40 MG tablet    Sig: Take 1 tablet (40 mg total) by mouth daily.    Dispense:  90 tablet    Refill:  3   Other Orders Orders Placed This Encounter  Procedures  . Glucose, capillary  . CMP14 + Anion Gap  . Magnesium  . Ambulatory referral to Dermatology    Referral Priority:   Routine    Referral Type:   Consultation    Referral Reason:   Specialty Services Required    Requested Specialty:   Dermatology    Number of Visits Requested:   1  . EKG 12-Lead   Follow Up: Return in about 3 months (around 09/27/2016).

## 2016-07-02 NOTE — Assessment & Plan Note (Signed)
Elevated alk phos noted on labs. Has had intermittent elveations in the past.  This may be a sign of vitamin d deficiency.  Will check Vitamin D and GGT at next visit.

## 2016-07-02 NOTE — Assessment & Plan Note (Signed)
HPI: Has been taking Metformin 1g BID and Glipizide 10mg  BID, as well as 100mg  of Invokana.  Occasionally feels light headed when standing that started after using invokana.  A: Uncontrolled Type 2 DM, with peripheral neuropathy, Improving  P: Continue current regimen.  Advised to increase water intake as invokana may be dehydrating him some.

## 2016-07-02 NOTE — Assessment & Plan Note (Signed)
Patient reports pain in right side of his chest, worse with movement also present at rest. Currently rates pain as a 3/10.  Hurts worse in the morning.  Has been going on for the last few days.  A: MSK chest pain  P: Pain is reproducible on exam.  Recommend rest and short course of naproxen.  Do not suspect cardiac cause as pain is atypical, he has a cardiac cath <1 year ago with only 10% stenosis.  I did obtain an EKG which shows a NSR with no ST changes or T wave abnormalities and is unchanged from his previous.

## 2016-07-02 NOTE — Assessment & Plan Note (Signed)
A: Eczema  P: Kenalog cream was not effective, LIDEX cream also not effective.  Will refer to dermatology.

## 2016-07-02 NOTE — Assessment & Plan Note (Signed)
HPI: Richard [name] not covered by his insurance.  He has been using albuterol multiple times a week and sometimes multiple times a day.  A: COPD with Asthma  P: Change dulera to Advair, discussed importance of taking. Encouraged smoking cessation, will resume Chantix.

## 2016-07-02 NOTE — Assessment & Plan Note (Signed)
HPI: He had stopped taking Atorvastatin, not sure why, does not recall myalgias, thought that I had told him to stop.  A: CAD  P: Resume Atorvastatin 40mg  daily

## 2016-07-02 NOTE — Assessment & Plan Note (Signed)
HPI: Taking lisinopril, no issues  A: Essential HTN Controlled  P: Continue lisinopril 10mg daily  

## 2016-07-13 ENCOUNTER — Other Ambulatory Visit (INDEPENDENT_AMBULATORY_CARE_PROVIDER_SITE_OTHER): Payer: Commercial Managed Care - HMO

## 2016-07-13 DIAGNOSIS — R748 Abnormal levels of other serum enzymes: Secondary | ICD-10-CM | POA: Diagnosis not present

## 2016-07-13 DIAGNOSIS — I10 Essential (primary) hypertension: Secondary | ICD-10-CM | POA: Diagnosis not present

## 2016-07-13 DIAGNOSIS — E559 Vitamin D deficiency, unspecified: Secondary | ICD-10-CM

## 2016-07-14 LAB — BMP8+ANION GAP
Anion Gap: 18 mmol/L (ref 10.0–18.0)
BUN / CREAT RATIO: 14 (ref 9–20)
BUN: 14 mg/dL (ref 6–24)
CHLORIDE: 101 mmol/L (ref 96–106)
CO2: 20 mmol/L (ref 18–29)
Calcium: 9.7 mg/dL (ref 8.7–10.2)
Creatinine, Ser: 1 mg/dL (ref 0.76–1.27)
GFR calc non Af Amer: 83 mL/min/{1.73_m2} (ref >59)
GFR, EST AFRICAN AMERICAN: 96 mL/min/{1.73_m2} (ref >59)
GLUCOSE: 153 mg/dL — AB (ref 65–99)
Potassium: 5 mmol/L (ref 3.5–5.2)
Sodium: 139 mmol/L (ref 134–144)

## 2016-07-14 LAB — VITAMIN D 25 HYDROXY (VIT D DEFICIENCY, FRACTURES): Vit D, 25-Hydroxy: 20.4 ng/mL — ABNORMAL LOW (ref 30.0–100.0)

## 2016-07-17 NOTE — Assessment & Plan Note (Signed)
Vit D found to be low at 20, called patient and he has  started taking a multiviatmin 4 days ago, he looked on the label and it says that it has 700 IU of vitamin D.  I told him he can continue this and we will recheck vitamin d at next visit.

## 2016-07-19 DIAGNOSIS — L309 Dermatitis, unspecified: Secondary | ICD-10-CM | POA: Diagnosis not present

## 2016-07-19 DIAGNOSIS — R6889 Other general symptoms and signs: Secondary | ICD-10-CM | POA: Diagnosis not present

## 2016-08-01 ENCOUNTER — Telehealth: Payer: Self-pay | Admitting: *Deleted

## 2016-08-01 MED ORDER — PREGABALIN 100 MG PO CAPS: 100.0000 mg | ORAL_CAPSULE | Freq: Three times a day (TID) | ORAL | 5 refills | Status: DC

## 2016-08-01 NOTE — Telephone Encounter (Signed)
Called to pharmacy 

## 2016-08-01 NOTE — Telephone Encounter (Signed)
Ulis Rias, could you phone in the prescription to the pharmacy, Lyrica 100mg  TID, number 90 with 5 refills.

## 2016-08-01 NOTE — Telephone Encounter (Signed)
Received faxed refill request from [facility] for Lyrica 100mg  caps (no sig).  Medication is no longer on pt's list, will send to pcp for review, please advise.Regenia Skeeter, Zamiyah Resendes Cassady10/25/201710:01 AM

## 2016-08-17 ENCOUNTER — Ambulatory Visit: Payer: Commercial Managed Care - HMO | Admitting: Podiatry

## 2016-08-20 DIAGNOSIS — L309 Dermatitis, unspecified: Secondary | ICD-10-CM | POA: Diagnosis not present

## 2016-09-11 ENCOUNTER — Encounter: Payer: Self-pay | Admitting: Podiatry

## 2016-09-11 ENCOUNTER — Ambulatory Visit (INDEPENDENT_AMBULATORY_CARE_PROVIDER_SITE_OTHER): Payer: Commercial Managed Care - HMO | Admitting: Podiatry

## 2016-09-11 VITALS — BP 168/119 | HR 100 | Resp 16

## 2016-09-11 DIAGNOSIS — E11621 Type 2 diabetes mellitus with foot ulcer: Secondary | ICD-10-CM

## 2016-09-11 DIAGNOSIS — B351 Tinea unguium: Secondary | ICD-10-CM | POA: Diagnosis not present

## 2016-09-11 DIAGNOSIS — E1149 Type 2 diabetes mellitus with other diabetic neurological complication: Secondary | ICD-10-CM

## 2016-09-11 DIAGNOSIS — L97509 Non-pressure chronic ulcer of other part of unspecified foot with unspecified severity: Secondary | ICD-10-CM | POA: Diagnosis not present

## 2016-09-11 DIAGNOSIS — M79676 Pain in unspecified toe(s): Secondary | ICD-10-CM | POA: Diagnosis not present

## 2016-09-11 DIAGNOSIS — E1140 Type 2 diabetes mellitus with diabetic neuropathy, unspecified: Secondary | ICD-10-CM

## 2016-09-11 NOTE — Progress Notes (Signed)
   Subjective:    Patient ID: Richard [name], male    DOB: 1961-04-10, 55 y.o.   MRN: [id number]  HPI this patient presents to the office with chief complaint of painful long thick nails. Patient states she has painful thick nails, which hurt walking and wearing his shoes. He states he lived in [place] and was seen every 3 months for his nails. .He states that he is a diabetic with neuropathy. He presents the office stating that he noticed that there was bleeding over the weekend and all today underneath the ball of his right foot as he doesn't experience any pain or discomfort and he noticed the bleeding and drainage from the ball of the right foot. He presents the office today for preventative foot care services, but also will be seen for the drainage from the callus on the bottom of his right forefoot    Review of Systems  All other systems reviewed and are negative.      Objective:   Physical Exam GENERAL APPEARANCE: Alert, conversant. Appropriately groomed. No acute distress.  VASCULAR: Pedal pulses are  palpable at  [facility] and PT bilateral.  Capillary refill time is immediate to all digits,  Normal temperature gradient.   NEUROLOGIC: sensation is absent  to 5.07 monofilament at 5/5 sites bilateral.  Light touch is intact bilateral, Muscle strength normal.  MUSCULOSKELETAL: acceptable muscle strength, tone and stability bilateral.  Intrinsic muscluature intact bilateral.  Rectus appearance of foot and digits noted bilateral.   DERMATOLOGIC: skin color, texture, and turgor are within normal limits.  No preulcerative lesions or ulcers  are seen, no interdigital maceration noted.  Patient has an approximate 20 x  20  millimeter ulcerated lesion under the second and third metatarsals of the right foot. There appears to be a blister that has burst distal to the callus. The skin appears to be healing except for one longitudinal break in skin under his third toe. There is necrotic tissue noted  under the callus of the plantar tyloma, right foot. No evidence of any drainage or infection noted. No malodor noted  NAILS  Thick disfigured discolored nails both feet.         Assessment & Plan:  Onychomycosis B/L  Diabetes with neuropathy.    Debridement of nails B/L  Debridement of necrotic tissue noted under the right forefoot, sub-23 tissue is removed and the ulcer measures approximately 20 x 20 mm no evidence of any drainage, redness or infection noted. No malodor noted. Neosporin and dry sterile dressing was applied. Patient was dispensed and OrthoWedge shoe to ambulate to off weight bear his right forefoot. He is to perform soaks at home.  If this condition worsens or becomes very painful, the patient was told to contact this office or go to the Emergency Department at the hospital. RTC 3 weeks.     Gardiner Barefoot DPM   Gardiner Barefoot DPM

## 2016-09-25 ENCOUNTER — Encounter: Payer: Self-pay | Admitting: Podiatry

## 2016-09-25 ENCOUNTER — Ambulatory Visit (INDEPENDENT_AMBULATORY_CARE_PROVIDER_SITE_OTHER): Payer: Commercial Managed Care - HMO | Admitting: Podiatry

## 2016-09-25 VITALS — BP 186/87 | HR 100 | Resp 14

## 2016-09-25 DIAGNOSIS — E11621 Type 2 diabetes mellitus with foot ulcer: Secondary | ICD-10-CM | POA: Diagnosis not present

## 2016-09-25 DIAGNOSIS — L97509 Non-pressure chronic ulcer of other part of unspecified foot with unspecified severity: Secondary | ICD-10-CM

## 2016-09-25 DIAGNOSIS — L84 Corns and callosities: Secondary | ICD-10-CM

## 2016-09-25 DIAGNOSIS — Q828 Other specified congenital malformations of skin: Secondary | ICD-10-CM

## 2016-09-25 NOTE — Progress Notes (Signed)
   Subjective:    Patient ID: Richard [name], male    DOB: 12-08-1960, 56 y.o.   MRN: [id number]  HPI this patient presents to the office stating his ulcer has closed and healed.  He has been using cam walker and diabetic shoes.  He says there is no drainage and there is no pain associated with his diabetic ulcer.    Review of Systems  All other systems reviewed and are negative.      Objective:   Physical Exam GENERAL APPEARANCE: Alert, conversant. Appropriately groomed. No acute distress.  VASCULAR: Pedal pulses are  palpable at  [facility] and PT bilateral.  Capillary refill time is immediate to all digits,  Normal temperature gradient.   NEUROLOGIC: sensation is absent  to 5.07 monofilament at 5/5 sites bilateral.  Light touch is intact bilateral, Muscle strength normal.  MUSCULOSKELETAL: acceptable muscle strength, tone and stability bilateral.  Intrinsic muscluature intact bilateral.  Rectus appearance of foot and digits noted bilateral.   DERMATOLOGIC: skin color, texture, and turgor are within normal limits.  The previous ulcer right forefoot has closed with no evidence of redness or swelling or drainage.  Ulcer has been replaced with plantar tyloma.  NAILS  Thick disfigured discolored nails both feet.         Assessment & Plan:    Diabetes with neuropathy.  Plantar callossity.      Debridement of necrotic tissue noted under the right forefoot, sub-23 tissue is removed .  Told to discontinue soaks.  Wear cam walker and diabetic shoe.  Examine foot for drainage.  RTC 9 weeks.      Gardiner Barefoot DPM

## 2016-09-28 ENCOUNTER — Other Ambulatory Visit: Payer: Self-pay | Admitting: *Deleted

## 2016-09-28 MED ORDER — FLUOCINONIDE-E 0.05 % EX CREA: 1.0000 "application " | TOPICAL_CREAM | Freq: Two times a day (BID) | CUTANEOUS | 0 refills | Status: DC

## 2016-09-28 NOTE — Telephone Encounter (Signed)
I will refill but without additional refills. At his last visit he noted this was not helping.  I had planned to refer to dermatology.  Would like to have him seen again by [place] to dermatology before additional refills.

## 2016-10-16 ENCOUNTER — Other Ambulatory Visit: Payer: Self-pay

## 2016-10-16 NOTE — Telephone Encounter (Signed)
pregabalin (LYRICA) 100 MG capsule, refill request @ [facility] on [address].

## 2016-10-17 ENCOUNTER — Telehealth: Payer: Self-pay | Admitting: Internal Medicine

## 2016-10-17 MED ORDER — PREGABALIN 100 MG PO CAPS: 100.0000 mg | ORAL_CAPSULE | Freq: Three times a day (TID) | ORAL | 5 refills | Status: DC

## 2016-10-17 NOTE — Telephone Encounter (Signed)
APT. REMINDER CALL, LMTCB °

## 2016-10-18 ENCOUNTER — Ambulatory Visit (INDEPENDENT_AMBULATORY_CARE_PROVIDER_SITE_OTHER): Payer: Medicare HMO | Admitting: Internal Medicine

## 2016-10-18 ENCOUNTER — Encounter: Payer: Self-pay | Admitting: Internal Medicine

## 2016-10-18 VITALS — BP 144/76 | HR 94 | Temp 98.1°F | Ht 73.0 in | Wt 227.3 lb

## 2016-10-18 DIAGNOSIS — Z872 Personal history of diseases of the skin and subcutaneous tissue: Secondary | ICD-10-CM

## 2016-10-18 DIAGNOSIS — Z7984 Long term (current) use of oral hypoglycemic drugs: Secondary | ICD-10-CM | POA: Diagnosis not present

## 2016-10-18 DIAGNOSIS — J449 Chronic obstructive pulmonary disease, unspecified: Secondary | ICD-10-CM

## 2016-10-18 DIAGNOSIS — E11319 Type 2 diabetes mellitus with unspecified diabetic retinopathy without macular edema: Secondary | ICD-10-CM

## 2016-10-18 DIAGNOSIS — I10 Essential (primary) hypertension: Secondary | ICD-10-CM

## 2016-10-18 DIAGNOSIS — L309 Dermatitis, unspecified: Secondary | ICD-10-CM

## 2016-10-18 DIAGNOSIS — J4489 Other specified chronic obstructive pulmonary disease: Secondary | ICD-10-CM

## 2016-10-18 DIAGNOSIS — Z79899 Other long term (current) drug therapy: Secondary | ICD-10-CM

## 2016-10-18 DIAGNOSIS — E1142 Type 2 diabetes mellitus with diabetic polyneuropathy: Secondary | ICD-10-CM | POA: Diagnosis not present

## 2016-10-18 DIAGNOSIS — E11311 Type 2 diabetes mellitus with unspecified diabetic retinopathy with macular edema: Secondary | ICD-10-CM

## 2016-10-18 DIAGNOSIS — F17211 Nicotine dependence, cigarettes, in remission: Secondary | ICD-10-CM

## 2016-10-18 DIAGNOSIS — J069 Acute upper respiratory infection, unspecified: Secondary | ICD-10-CM | POA: Insufficient documentation

## 2016-10-18 DIAGNOSIS — E1140 Type 2 diabetes mellitus with diabetic neuropathy, unspecified: Secondary | ICD-10-CM

## 2016-10-18 DIAGNOSIS — E559 Vitamin D deficiency, unspecified: Secondary | ICD-10-CM | POA: Diagnosis not present

## 2016-10-18 DIAGNOSIS — E1165 Type 2 diabetes mellitus with hyperglycemia: Principal | ICD-10-CM

## 2016-10-18 LAB — POCT GLYCOSYLATED HEMOGLOBIN (HGB A1C): Hemoglobin A1C: 8.8

## 2016-10-18 LAB — GLUCOSE, CAPILLARY: Glucose-Capillary: 146 mg/dL — ABNORMAL HIGH (ref 65–99)

## 2016-10-18 MED ORDER — ALBUTEROL SULFATE HFA 108 (90 BASE) MCG/ACT IN AERS: 1.0000 | INHALATION_SPRAY | Freq: Four times a day (QID) | RESPIRATORY_TRACT | 5 refills | Status: DC | PRN

## 2016-10-18 MED ORDER — DULERA 100-5 MCG/ACT IN AERO: 2.0000 | INHALATION_SPRAY | Freq: Two times a day (BID) | RESPIRATORY_TRACT | 5 refills | Status: DC | PRN

## 2016-10-18 MED ORDER — BENZONATATE 200 MG PO CAPS
200.0000 mg | ORAL_CAPSULE | Freq: Three times a day (TID) | ORAL | 0 refills | Status: DC | PRN
Start: 2016-10-18 — End: 2017-04-05

## 2016-10-18 MED ORDER — CANAGLIFLOZIN 300 MG PO TABS: 300.0000 mg | ORAL_TABLET | Freq: Every day | ORAL | 11 refills | Status: DC

## 2016-10-18 NOTE — Assessment & Plan Note (Signed)
HPI: He reports that he needs refill of dulera, I had previously ordered him Advair as Ruthe Mannan was not covered.  Otherwise since I last saw him he has stopped smoking and reports he is doing great.  A: COPD with Asthma  P: Continue dulera, or advair if he again is unable to get dulera.

## 2016-10-18 NOTE — Assessment & Plan Note (Signed)
He reports 1.5 weeks of non productive cough, nasal congestion, and some sinus headaches.  He feels he is getting slightly better,  Feels like the "common cold", he denies any myalgias or fever.  A: viral URI  P: Continue supportive care Tessalon pearles for cough.

## 2016-10-18 NOTE — Assessment & Plan Note (Signed)
HPI: Taking lisinopril, no issues  A: Essential HTN Controlled  P: Continue lisinopril 10mg daily  

## 2016-10-18 NOTE — Patient Instructions (Signed)
Upper Respiratory Infection, Adult Most upper respiratory infections (URIs) are a viral infection of the air passages leading to the lungs. A URI affects the nose, throat, and upper air passages. The most common type of URI is nasopharyngitis and is typically referred to as "the common cold." URIs run their course and usually go away on their own. Most of the time, a URI does not require medical attention, but sometimes a bacterial infection in the upper airways can follow a viral infection. This is called a secondary infection. Sinus and middle ear infections are common types of secondary upper respiratory infections. Bacterial pneumonia can also complicate a URI. A URI can worsen asthma and chronic obstructive pulmonary disease (COPD). Sometimes, these complications can require emergency medical care and may be life threatening. What are the causes? Almost all URIs are caused by viruses. A virus is a type of germ and can spread from one person to another. What increases the risk? You may be at risk for a URI if:  You smoke.  You have chronic heart or lung disease.  You have a weakened defense (immune) system.  You are very young or very old.  You have nasal allergies or asthma.  You work in crowded or poorly ventilated areas.  You work in health care facilities or schools.  What are the signs or symptoms? Symptoms typically develop 2-3 days after you come in contact with a cold virus. Most viral URIs last 7-10 days. However, viral URIs from the influenza virus (flu virus) can last 14-18 days and are typically more severe. Symptoms may include:  Runny or stuffy (congested) nose.  Sneezing.  Cough.  Sore throat.  Headache.  Fatigue.  Fever.  Loss of appetite.  Pain in your forehead, behind your eyes, and over your cheekbones (sinus pain).  Muscle aches.  How is this diagnosed? Your health care provider may diagnose a URI by:  Physical exam.  Tests to check that your  symptoms are not due to another condition such as: ? Strep throat. ? Sinusitis. ? Pneumonia. ? Asthma.  How is this treated? A URI goes away on its own with time. It cannot be cured with medicines, but medicines may be prescribed or recommended to relieve symptoms. Medicines may help:  Reduce your fever.  Reduce your cough.  Relieve nasal congestion.  Follow these instructions at home:  Take medicines only as directed by your health care provider.  Gargle warm saltwater or take cough drops to comfort your throat as directed by your health care provider.  Use a warm mist humidifier or inhale steam from a shower to increase air moisture. This may make it easier to breathe.  Drink enough fluid to keep your urine clear or pale yellow.  Eat soups and other clear broths and maintain good nutrition.  Rest as needed.  Return to work when your temperature has returned to normal or as your health care provider advises. You may need to stay home longer to avoid infecting others. You can also use a face mask and careful hand washing to prevent spread of the virus.  Increase the usage of your inhaler if you have asthma.  Do not use any tobacco products, including cigarettes, chewing tobacco, or electronic cigarettes. If you need help quitting, ask your health care provider. How is this prevented? The best way to protect yourself from getting a cold is to practice good hygiene.  Avoid oral or hand contact with people with cold symptoms.  Wash your   hands often if contact occurs.  There is no clear evidence that vitamin C, vitamin E, echinacea, or exercise reduces the chance of developing a cold. However, it is always recommended to get plenty of rest, exercise, and practice good nutrition. Contact a health care provider if:  You are getting worse rather than better.  Your symptoms are not controlled by medicine.  You have chills.  You have worsening shortness of breath.  You have  brown or red mucus.  You have yellow or brown nasal discharge.  You have pain in your face, especially when you bend forward.  You have a fever.  You have swollen neck glands.  You have pain while swallowing.  You have white areas in the back of your throat. Get help right away if:  You have severe or persistent: ? Headache. ? Ear pain. ? Sinus pain. ? Chest pain.  You have chronic lung disease and any of the following: ? Wheezing. ? Prolonged cough. ? Coughing up blood. ? A change in your usual mucus.  You have a stiff neck.  You have changes in your: ? Vision. ? Hearing. ? Thinking. ? Mood. This information is not intended to replace advice given to you by your health care provider. Make sure you discuss any questions you have with your health care provider. Document Released: 03/20/2001 Document Revised: 05/27/2016 Document Reviewed: 12/30/2013 Elsevier Interactive Patient Education  2017 Elsevier Inc.  

## 2016-10-18 NOTE — Assessment & Plan Note (Signed)
HPI: At our last visit he was concerned about a rash that we had throught was ezcema that was not responding to steroids.  I referred him to dermatology.  They were concerned about scabies versus contact dermatitis.  He changes to perfume free soaps and detergents and followed their recommendations, he was also prescribed medication for scabies which he took.  His dermatitis has now resolved.  A: Dermatitis resolved  P: Continue to monitor.

## 2016-10-18 NOTE — Assessment & Plan Note (Signed)
HPI: Lyrica is helping, takes 3 times a day.  A: Diabetic neuropathy  P: Continue Lyrica 100mg  TID.

## 2016-10-18 NOTE — Assessment & Plan Note (Signed)
HPI: Has been taking Metformin 1g BID and Glipizide 10mg  BID, as well as 100mg  of Invokana.  Admits some dietary indiscretion over the holidays.  A: Uncontrolled Type 2 DM, with peripheral neuropathy, and retinopahty  P: Increase invokana to 300mg  daily. Discussed diet.  Will try to get his retinopathy records from Dr Dannielle Burn office.

## 2016-10-18 NOTE — Progress Notes (Signed)
[facility] Subjective:  HPI: Richard [name] is a 56 y.o. male who presents for routine follow of of HTN and DM.   Please see problem based charting below for the status of his chronic medical problems. Review of Systems: Review of Systems  Constitutional: Negative for chills, fever, malaise/fatigue and weight loss.  HENT: Positive for congestion and sore throat. Negative for hearing loss and tinnitus.   Eyes: Negative for blurred vision.  Respiratory: Positive for cough. Negative for sputum production, shortness of breath and wheezing.   Cardiovascular: Negative for chest pain.  Gastrointestinal: Negative for abdominal pain, diarrhea and vomiting.  Musculoskeletal: Negative for myalgias.  Neurological: Positive for headaches.    Objective:  Physical Exam: Vitals:   10/18/16 0943  BP: (!) 144/76  Pulse: 94  Temp: 98.1 F (36.7 C)  TempSrc: Oral  SpO2: 100%  Weight: 227 lb 4.8 oz (103.1 kg)  Height: 6\' 1"  (1.854 m)   Physical Exam  Constitutional: He is oriented to person, place, and time and well-developed, well-nourished, and in no distress.  HENT:  Nose: Nose normal.  Mouth/Throat: Oropharynx is clear and moist. No oropharyngeal exudate.  Eyes: Conjunctivae are normal.  Cardiovascular: Normal rate and regular rhythm.   Pulmonary/Chest: Effort normal and breath sounds normal. He has no wheezes.  Abdominal: Soft. Bowel sounds are normal.  Musculoskeletal: He exhibits no edema.  Neurological: He is alert and oriented to person, place, and time.  Skin: Skin is warm and dry. No rash noted.  Nursing note and vitals reviewed.  Assessment & Plan:  Essential hypertension HPI: Taking lisinopril, no issues  A: Essential HTN Controlled  P: Continue lisinopril 10mg  daily   COPD with asthma ([place]) HPI: He reports that he needs refill of dulera, I had previously ordered him Advair as Ruthe Mannan was not covered.  Otherwise since I last saw him he has  stopped smoking and reports he is doing great.  A: COPD with Asthma  P: Continue dulera, or advair if he again is unable to get dulera.  URI (upper respiratory infection) He reports 1.5 weeks of non productive cough, nasal congestion, and some sinus headaches.  He feels he is getting slightly better,  Feels like the "common cold", he denies any myalgias or fever.  A: viral URI  P: Continue supportive care Tessalon pearles for cough.  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  Admits some dietary indiscretion over the holidays.  A: Uncontrolled Type 2 DM, with peripheral neuropathy, and retinopahty  P: Increase invokana to 300mg  daily. Discussed diet.  Will try to get his retinopathy records from Dr Dannielle Burn office.  Diabetic neuropathy, painful (HCC) HPI: Lyrica is helping, takes 3 times a day.  A: Diabetic neuropathy  P: Continue Lyrica 100mg  TID.  Dermatitis HPI: At our last visit he was concerned about a rash that we had throught was ezcema that was not responding to steroids.  I referred him to dermatology.  They were concerned about scabies versus contact dermatitis.  He changes to perfume free soaps and detergents and followed their recommendations, he was also prescribed medication for scabies which he took.  His dermatitis has now resolved.  A: Dermatitis resolved  P: Continue to monitor.  Vitamin D deficiency A: Vitamin D deficiency  P: Repeat Vitamin D measurement.   Medications Ordered Meds ordered this  encounter  Medications  . benzonatate (TESSALON) 200 MG capsule    Sig: Take 1 capsule (200 mg total) by mouth 3 (three) times daily as needed for cough.    Dispense:  30 capsule    Refill:  0  . canagliflozin (INVOKANA) 300 MG TABS tablet    Sig: Take 1 tablet (300 mg total) by mouth daily  before breakfast.    Dispense:  30 tablet    Refill:  11  . albuterol (PROAIR HFA) 108 (90 Base) MCG/ACT inhaler    Sig: Inhale 1 puff into the lungs every 6 (six) hours as needed for wheezing or shortness of breath.    Dispense:  1 Inhaler    Refill:  5  . DULERA 100-5 MCG/ACT AERO    Sig: Inhale 2 puffs into the lungs 2 (two) times daily as needed. Inhale 2 puffs by mouth into the lungs twice a day    Dispense:  1 Inhaler    Refill:  5   Other Orders Orders Placed This Encounter  Procedures  . Vitamin D (25 hydroxy)  . Glucose, capillary  . POC Hbg A1C   Follow Up: Return in about 3 months (around 01/16/2017).

## 2016-10-18 NOTE — Assessment & Plan Note (Signed)
A: Vitamin D deficiency  P: Repeat Vitamin D measurement.

## 2016-10-19 LAB — VITAMIN D 25 HYDROXY (VIT D DEFICIENCY, FRACTURES): VIT D 25 HYDROXY: 19.6 ng/mL — AB (ref 30.0–100.0)

## 2016-10-29 ENCOUNTER — Ambulatory Visit (INDEPENDENT_AMBULATORY_CARE_PROVIDER_SITE_OTHER): Payer: Medicare HMO | Admitting: Dietician

## 2016-10-29 DIAGNOSIS — E11311 Type 2 diabetes mellitus with unspecified diabetic retinopathy with macular edema: Secondary | ICD-10-CM

## 2016-10-29 DIAGNOSIS — Z87891 Personal history of nicotine dependence: Secondary | ICD-10-CM

## 2016-10-29 DIAGNOSIS — Z713 Dietary counseling and surveillance: Secondary | ICD-10-CM

## 2016-10-29 DIAGNOSIS — E1165 Type 2 diabetes mellitus with hyperglycemia: Secondary | ICD-10-CM

## 2016-10-29 NOTE — Progress Notes (Signed)
  Medical Nutrition Therapy:  Appt start time: 1010 end time:  1055 Visit # 1 this year, 5 in total  Assessment:  Primary concerns today: blood sugar control via meal planning  Richard [name] is here for his follow up visit. He quit smoking and would like his weight to be ~ 200 and his A1C to be 6-7%. He did not bring his meter today. He is opposed to ever using insulin.  He is not exercising because he has to use public transportation to get there.  He says he is eating more vegetables, larger portions of fruit,brown rice and bread and less sugar, sweets, but is snacking more because he is in the house more. He has not increased his Invokana yet. He notes that blood sugars are higher when he eats pancakes and egg and sausage sandwich    ANTHROPOMETRICS: weight-226.5#, increased ~ 7 pounds, waist circumference 42", goal is <40" for males.  BLOOD SUGAR: A1C increased to 8.8%, he reports that his before meal blood sugars are 150-155 before breakfast and 160-200 before dinner DIETARY INTAKE: Eating 2 meals and 1-2 snacks  today: coffee with cream and sugar,  Meal 1- eggs and sausage and toast or 2-3 pancakes with light syrup or an egg, cheese and sausage sandwich with mayo.  Meal 2- largest- baked chicken, peas & carrots, mac and cheese, diet drink powder added to water  Usual physical activity: ADLs and light walking 15 minutes most days  Estimated energy needs: 2200-2500 calories 275-300 g carbohydrates   Progress Towards Goal(s):  In progress.   Nutritional Diagnosis:  NI-5.8.2 Excessive carbohydrate intake As related to his snacking and skipping meals regressed As evidenced by his increased weight and A1C.    Intervention:  Nutrition education about interpretation of self monitoring results, and how to use this to adjust food intake to help with both weight and blood sugars.  He plans to check blood sugar before and after one meal each day until he returns and if values are higher than goals  write down food eaten and try adjusting it.  Education about supplementing vitamin D.    Coordination of care: consider education about GLP-1s.   Teaching Method Utilized: Visual,, Auditory,Hands on  Handouts given during visit include: AVS Barriers to learning/adherence to lifestyle change:  None noted today Demonstrated degree of understanding via:  Teach Back   Monitoring/Evaluation:  Dietary intake, exercise,meter, and body weight in 1 month(s). [place], RD 10/29/2016 11:11 AM.  .

## 2016-10-29 NOTE — Patient Instructions (Addendum)
This has vitamin D in it- can drink small cup- 4 ounces a day

## 2016-11-15 ENCOUNTER — Telehealth: Payer: Self-pay | Admitting: Dietician

## 2016-11-15 DIAGNOSIS — E11311 Type 2 diabetes mellitus with unspecified diabetic retinopathy with macular edema: Secondary | ICD-10-CM

## 2016-11-15 DIAGNOSIS — E1165 Type 2 diabetes mellitus with hyperglycemia: Principal | ICD-10-CM

## 2016-11-15 NOTE — Telephone Encounter (Signed)
[name] called for more terst strips and t know when his appointment with me is. He is testing twice a day and running out of strips early. He says he uses the information from his meter to adjust what he is does/eats/etc for example if it is high, he may delay eating until it is lower or eat less if it too low he would eat something to bring it up and then retest.  I explained to him that Medicaid often requires proof that he is using the strips and to know that he is using the results to help self manage his diabetes. He agreed to bring his meter in to his appointment on Monday

## 2016-11-19 ENCOUNTER — Ambulatory Visit (INDEPENDENT_AMBULATORY_CARE_PROVIDER_SITE_OTHER): Payer: Medicare HMO | Admitting: Dietician

## 2016-11-19 ENCOUNTER — Encounter (INDEPENDENT_AMBULATORY_CARE_PROVIDER_SITE_OTHER): Payer: Self-pay

## 2016-11-19 DIAGNOSIS — R6889 Other general symptoms and signs: Secondary | ICD-10-CM | POA: Diagnosis not present

## 2016-11-19 DIAGNOSIS — E11311 Type 2 diabetes mellitus with unspecified diabetic retinopathy with macular edema: Secondary | ICD-10-CM

## 2016-11-19 DIAGNOSIS — Z713 Dietary counseling and surveillance: Secondary | ICD-10-CM

## 2016-11-19 DIAGNOSIS — E1165 Type 2 diabetes mellitus with hyperglycemia: Secondary | ICD-10-CM

## 2016-11-19 NOTE — Telephone Encounter (Signed)
OK thank you 

## 2016-11-19 NOTE — Progress Notes (Signed)
  Medical Nutrition Therapy:  Appt start time: 0920 end time:  [id number] Visit # 2 this year, 6 in total  Assessment:  Primary concerns today: blood sugar control via meal planning  Richard [name] is here for his follow up visit. He would like his weight to be ~ 200 and his A1C to be 6-7%. He brought his meter today. He is opposed to using insulin.  He is not exercising because he has to use public transportation to get there.  He says he is eating more vegetables and thinks he can eat  More. He identifies that exercising in the winter is a problem.     ANTHROPOMETRICS: weight-223.2#, UBW- 220#, IBW ~ 210#, waist circumference not measured today.  BLOOD SUGAR: Last A1C 8.8%, meter download shows average of 196 ( ~ a1c of 8.5%), range 123-326,  DIETARY INTAKE: Not done today as patient was in a hurry to get to his eye doctor appointment.  Usual physical activity: ADLs and light walking 15 minutes most days  Estimated energy needs: 2200-2500 calories 275-300 g carbohydrates  Progress Towards Goal(s):  In progress.   Nutritional Diagnosis:  NI-5.8.2 Excessive carbohydrate intake As related to his snacking and skipping meals regressed As evidenced by his increased weight and A1C.    Intervention:  Nutrition education about interpretation of self monitoring results, and how to use this to adjust food intake to help with both weight and blood sugars. .    Coordination of care: none   Teaching Method Utilized: Visual,, Auditory Handouts given during visit include: AVS Barriers to learning/adherence to lifestyle change:  Lack of material resources, transportation, activity in winter Demonstrated degree of understanding via:  Teach Back   Monitoring/Evaluation:  Dietary intake, exercise,meter, and body weight in 2 month(s). Plyler, Richard [name], RD 11/19/2016 9:55 AM.  .

## 2016-11-19 NOTE — Patient Instructions (Addendum)
Your goals are:  1-  to try to  Eat more earlier in the day.   2- Have a big salad or lot's of vegetables for dinner with protein to try to keep night times sugars ;pwer.  3- ? start 300 mg Invokana on Tuesday  4- Download the One touch reveal APP, pair your phone with the meter via bluetooth,  so your meter automatically uploads to your phone and you can see reports at home yourself

## 2016-11-20 ENCOUNTER — Telehealth: Payer: Self-pay | Admitting: *Deleted

## 2016-11-20 MED ORDER — ONETOUCH DELICA LANCETS FINE MISC: 5 refills | Status: DC

## 2016-11-20 MED ORDER — GLUCOSE BLOOD VI STRP: ORAL_STRIP | 5 refills | Status: DC

## 2016-11-20 NOTE — Telephone Encounter (Signed)
Received PA request from pt's pharmacy for his [facility].  Pt has been on advair in the past.  Request submitted online via Cover My Meds.  Request sent for "reveiw"    Cover my meds info: Key: [id number] Last name: Mccarther Dob: 09/21/1961  Questionnaire submitted. PA Case [id number] Status: Pending review.

## 2016-11-21 ENCOUNTER — Other Ambulatory Visit: Payer: Self-pay | Admitting: Dietician

## 2016-11-21 DIAGNOSIS — E11311 Type 2 diabetes mellitus with unspecified diabetic retinopathy with macular edema: Secondary | ICD-10-CM

## 2016-11-21 DIAGNOSIS — E1165 Type 2 diabetes mellitus with hyperglycemia: Principal | ICD-10-CM

## 2016-11-21 MED ORDER — ACCU-CHEK FASTCLIX LANCETS MISC: 7 refills | Status: DC

## 2016-11-21 MED ORDER — GLUCOSE BLOOD VI STRP: ORAL_STRIP | 7 refills | Status: DC

## 2016-11-21 MED ORDER — ACCU-CHEK GUIDE W/DEVICE KIT
1.0000 | PACK | Freq: Two times a day (BID) | 1 refills | Status: DC
Start: 2016-11-21 — End: 2020-08-10

## 2016-11-21 NOTE — Telephone Encounter (Signed)
Patient called saying he still has not gotten his test strips. Note the order was sent to [facility] per his request. CDE called [employer] to try to expedite the filling of this order. [employer] confirmed receipt of order, however they do not cover the One touch products anymore. They would like all  New prescriptions for one of the meters they do cover including the meter.  Patient notified.

## 2016-11-23 NOTE — Addendum Note (Signed)
Addended by: [name] C on: 11/23/2016 11:29 AM   Modules accepted: Orders

## 2016-11-26 MED ORDER — FLUTICASONE FUROATE-VILANTEROL 100-25 MCG/INH IN AEPB: 1.0000 | INHALATION_SPRAY | Freq: Every day | RESPIRATORY_TRACT | 5 refills | Status: DC

## 2016-11-26 NOTE — Telephone Encounter (Signed)
Received fax from [facility] requesting additional info "Please explain why the following covered formulary alternative(s) would be ineffective or inappropriate for the patient: Symbicort HFA aerosol inhaler, Breo Ellipta powder for inhalation?"  Per fax-the above info needed to be received by [facility] no later than 11/22/2016 21:53:52. Since we are now past that date/time, I will send info to pcp.  Can we change patient to one of the preferred medications above-if appropriate? Despina Hidden Cassady2/19/20182:38 PM

## 2016-11-26 NOTE — Telephone Encounter (Addendum)
Received fax from [facility] with the following statement "Rx for accu-chek guide meter and strips.  Please advise, can we fill rx as accu-check nano meter and smartview strips (as per patient's request)? Yes or no (specify). Thanks."    I spoke with patient-he did not authorize change or atleast he didn't mean to.  Both accu-chek guide and nano are covered meters.  Will leave rx as is. No further acton needed.Despina Hidden Cassady2/19/20184:39 PM

## 2016-11-26 NOTE — Telephone Encounter (Signed)
Noted, changed to breo ellipta

## 2016-11-29 ENCOUNTER — Telehealth: Payer: Self-pay | Admitting: Dietician

## 2016-11-29 NOTE — Telephone Encounter (Signed)
Patient called and left a message that he still has not heard about a blood glucose meter or strips. Note that [employer] wanted to fill the prescription with an alternative Accu chek meter.   On the 16th the patient told [employer] he wanted the Nano. [facility] pharmacy said there was some confusion, but it has been resolved. The Accu chek Guide and strips and lancets will be sent out today.   Called patient and let him know. He has [employer]'s number and agreed to call them as needed.

## 2016-11-30 ENCOUNTER — Ambulatory Visit: Payer: Commercial Managed Care - HMO | Admitting: Podiatry

## 2016-12-18 ENCOUNTER — Other Ambulatory Visit: Payer: Self-pay | Admitting: *Deleted

## 2016-12-18 MED ORDER — GLIPIZIDE 10 MG PO TABS: 10.0000 mg | ORAL_TABLET | Freq: Two times a day (BID) | ORAL | 1 refills | Status: DC

## 2016-12-25 ENCOUNTER — Ambulatory Visit: Payer: Commercial Managed Care - HMO | Admitting: Podiatry

## 2017-01-04 ENCOUNTER — Ambulatory Visit: Payer: Medicare HMO | Admitting: Podiatry

## 2017-01-04 DIAGNOSIS — R6889 Other general symptoms and signs: Secondary | ICD-10-CM | POA: Diagnosis not present

## 2017-01-07 ENCOUNTER — Encounter (HOSPITAL_COMMUNITY): Payer: Self-pay | Admitting: Emergency Medicine

## 2017-01-07 ENCOUNTER — Ambulatory Visit (HOSPITAL_COMMUNITY)
Admission: EM | Admit: 2017-01-07 | Discharge: 2017-01-07 | Disposition: A | Discharge status: Home / Self Care | Payer: Medicare HMO | Attending: Family Medicine | Admitting: Family Medicine

## 2017-01-07 DIAGNOSIS — R0602 Shortness of breath: Secondary | ICD-10-CM | POA: Diagnosis not present

## 2017-01-07 DIAGNOSIS — J441 Chronic obstructive pulmonary disease with (acute) exacerbation: Secondary | ICD-10-CM

## 2017-01-07 MED ORDER — PREDNISONE 20 MG PO TABS: ORAL_TABLET | ORAL | 0 refills | Status: DC

## 2017-01-07 MED ORDER — ALBUTEROL SULFATE (2.5 MG/3ML) 0.083% IN NEBU
INHALATION_SOLUTION | RESPIRATORY_TRACT | Status: AC
  Filled 2017-01-07: qty 3

## 2017-01-07 MED ORDER — ALBUTEROL SULFATE (2.5 MG/3ML) 0.083% IN NEBU
2.5000 mg | INHALATION_SOLUTION | Freq: Once | RESPIRATORY_TRACT | Status: AC
  Administered 2017-01-07: 2.5 mg via RESPIRATORY_TRACT

## 2017-01-07 NOTE — ED Triage Notes (Addendum)
SOB for 2 days.  Started a new medicine on Thursday-Breo Felt like he could not breathe in his sleep, so he did not use this again  Patient reports pcp has bee trying different medicines for his asthma.    Patient has felt this way before, just needing a treatment.  Audible respirations

## 2017-01-07 NOTE — ED Provider Notes (Signed)
[place]    CSN: [id number] Arrival date & time: 01/07/17  1213     History   Chief Complaint Chief Complaint  Patient presents with  . Shortness of Breath    HPI Richard [name] is a 56 y.o. male.   SOB for 2 days.  Started a new medicine on Thursday-Breo Felt like he could not breathe in his sleep, so he did not use this again  Patient reports pcp has bee trying different medicines for his asthma.    Patient has felt this way before, just needing a treatment.  Audible respirations  Is a patient with COPD and is going to school. He is not in a dusty environment. Beginning last Wednesday he developed sneezing and by Thursday he was wheezing. He tried a new inhaler Thursday which is once a day and in the middle of night between Thursday and Friday he had an asthma attack which is really persisted since. He gets some benefit from albuterol but he feels that the tightness is not breaking. He's had no fever and no vomiting.  Patient has a history of diabetes as well. It's been well controlled      Past Medical History:  Diagnosis Date  . Asthma   . Clostridium difficile colitis 09/09/2014  . Diabetes mellitus without complication ([place])    diagnosed in 2013  . Essential hypertension   . Glaucoma    bilateral, surgery on left eye but not right  . Hyperlipidemia   . Neuromuscular disorder (HCC)    neuropathy in feet  . 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 Substance abuse    crack - recovered x 30 yrs    Patient Active Problem List   Diagnosis Date Noted  . URI (upper respiratory infection) 10/18/2016  . Vitamin D deficiency 07/02/2016  . Tubular adenoma of colon 02/06/2016  . Diabetic neuropathy, painful ([place]) 11/30/2015  . Dermatitis 11/30/2015  . Overweight (BMI 25.0-29.9) 11/30/2015  . CAD in native artery 10/20/2015  . Tobacco abuse   . Essential hypertension   . 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)   . COPD with asthma [facility])     Past Surgical History:  Procedure Laterality Date  . APPENDECTOMY    . CARDIAC CATHETERIZATION N/A 09/26/2015   Procedure: Left Heart Cath and Coronary Angiography;  Surgeon: [name], MD;  Location: [place] [facility];  Service: Cardiovascular;  Laterality: N/A;  . left eye surgery Left    glaucoma  . SKIN GRAFT         Home Medications    Prior to Admission medications   Medication Sig Start Date End Date Taking? Authorizing Provider  ACCU-CHEK FASTCLIX LANCETS MISC Check blood sugar two times a day 11/21/16   [name], DO  albuterol (PROAIR HFA) 108 (90 Base) MCG/ACT inhaler Inhale 1 puff into the lungs every 6 (six) hours as needed for wheezing or shortness of breath. 10/18/16   [name], DO  aspirin EC 81 MG EC tablet Take 1 tablet (81 mg total) by mouth daily. 09/26/15   Florinda Marker, MD  atorvastatin (LIPITOR) 40 MG tablet Take 1 tablet (40 mg total) by mouth daily. 06/28/16 06/28/17  [name], DO  benzonatate (TESSALON) 200 MG capsule Take 1 capsule (200 mg total) by mouth 3 (three) times daily as needed for cough. 10/18/16   [name], DO  Blood Glucose Monitoring Suppl (  ACCU-CHEK GUIDE) w/Device KIT 1 each by Does not apply route 2 (two) times daily. 11/21/16   [name], DO  canagliflozin (INVOKANA) 300 MG TABS tablet Take 1 tablet (300 mg total) by mouth daily before breakfast. 10/18/16   [name], DO  fluticasone furoate-vilanterol (BREO ELLIPTA) 100-25 MCG/INH AEPB Inhale 1 puff into the lungs daily. 11/26/16   [name], DO  glipiZIDE (GLUCOTROL) 10 MG tablet Take 1 tablet (10 mg total) by mouth 2 (two) times daily. 12/18/16   [name], DO  glucose blood (ACCU-CHEK GUIDE) test strip Check blood sugar two times a day 11/21/16   [name], DO  latanoprost (XALATAN) 0.005 % ophthalmic solution INSTILL 1 DROP INTO BOTH EYES IN THE EVENING 10/21/15   Historical Provider, MD    lisinopril (PRINIVIL,ZESTRIL) 10 MG tablet Take 10 mg every night Patient taking differently: daily. Take 10 mg every night 10/20/15   Erlene Quan, PA-C  metFORMIN (GLUCOPHAGE) 1000 MG tablet Take 1 tablet (1,000 mg total) by mouth 2 (two) times daily with a meal. 06/28/16   [name], DO  predniSONE (DELTASONE) 20 MG tablet Two daily with food 01/07/17   Robyn Haber, MD  pregabalin (LYRICA) 100 MG capsule Take 1 capsule (100 mg total) by mouth 3 (three) times daily. 10/17/16   [name], DO    Family History Family History  Problem Relation Age of Onset  . Hypertension Mother   . Colon cancer Neg Hx   . Colon polyps Neg Hx   . Esophageal cancer Neg Hx   . Rectal cancer Neg Hx   . Stomach cancer Neg Hx     Social History Social History  Substance Use Topics  . Smoking status: Former Smoker    Packs/day: 1.00    Types: Cigarettes    Quit date: 08/13/2016  . Smokeless tobacco: Former Systems developer    Quit date: 08/24/2016     Comment: 5-6 CIAGARETTES A DAY  . Alcohol use No     Allergies   Patient has no known allergies.   Review of Systems Review of Systems  Constitutional: Negative.   HENT: Positive for sneezing.   Respiratory: Positive for chest tightness, shortness of breath and wheezing.   Cardiovascular: Negative.   Gastrointestinal: Negative.   Genitourinary: Negative.   Musculoskeletal: Negative.   Neurological: Negative.      Physical Exam Triage Vital Signs ED Triage Vitals [01/07/17 1300]  Enc Vitals Group     BP 125/70     Pulse Rate 91     Resp (!) 22     Temp 98.6 F (37 C)     Temp Source Oral     SpO2 96 %     Weight      Height      Head Circumference      Peak Flow      Pain Score      Pain Loc      Pain Edu?      Excl. in [place]?    No data found.   Updated Vital Signs BP 125/70 (BP Location: Left Arm)   Pulse 91   Temp 98.6 F (37 C) (Oral)   Resp (!) 22 Comment: notified rn  SpO2 96%    Physical Exam  Constitutional:  He is oriented to person, place, and time. He appears well-developed and well-nourished.  HENT:  Mouth/Throat: Oropharynx is clear and moist.  Deformed right ear  Eyes:  Conjunctivae are normal. Pupils are equal, round, and reactive to light.  Neck: Normal range of motion. Neck supple.  Cardiovascular: Normal rate, regular rhythm and normal heart sounds.   Pulmonary/Chest: He is in respiratory distress. He has wheezes.  Musculoskeletal: Normal range of motion.  Neurological: He is alert and oriented to person, place, and time.  Skin: Skin is warm and dry.  Nursing note and vitals reviewed.    UC Treatments / Results  Labs (all labs ordered are listed, but only abnormal results are displayed) Labs Reviewed - No data to display  EKG  EKG Interpretation None       Radiology No results found.  Procedures Procedures (including critical care time)  Medications Ordered in UC Medications  albuterol (PROVENTIL) (2.5 MG/3ML) 0.083% nebulizer solution 2.5 mg (not administered)     Initial Impression / Assessment and Plan / UC Course  I have reviewed the triage vital signs and the nursing notes.  Pertinent labs & imaging results that were available during my care of the patient were reviewed by me and considered in my medical decision making (see chart for details).     Final Clinical Impressions(s) / UC Diagnoses   Final diagnoses:  COPD exacerbation (HCC)    New Prescriptions New Prescriptions   PREDNISONE (DELTASONE) 20 MG TABLET    Two daily with food     Robyn Haber, MD 01/07/17 1327

## 2017-01-11 ENCOUNTER — Ambulatory Visit: Payer: Medicare HMO | Admitting: Podiatry

## 2017-01-16 NOTE — Progress Notes (Signed)
[facility] Subjective:  HPI: Richard [name] is a 56 y.o. male who presents for follow up of HTN and DM  Please see problem based charting below for the status of his chronic medical problems.  Review of Systems: Review of Systems  Constitutional: Negative for fever.  Eyes: Negative for blurred vision.  Respiratory: Negative for cough and shortness of breath.   Cardiovascular: Negative for chest pain.  Gastrointestinal: Negative for diarrhea.    Objective:  Physical Exam: Vitals:   01/17/17 1034  BP: 129/73  Pulse: 80  Temp: 98.3 F (36.8 C)  TempSrc: Oral  SpO2: 100%  Height: 6\' 3"  (1.905 m)   Physical Exam  Constitutional: He is well-developed, well-nourished, and in no distress.  HENT:  Mouth/Throat: Oropharynx is clear and moist.  Cardiovascular: Normal rate, regular rhythm and normal heart sounds.   PT and DP pulses present bilaterally  Pulmonary/Chest: Effort normal and breath sounds normal. He has no wheezes.  Abdominal: Soft. Bowel sounds are normal.  Musculoskeletal: He exhibits no edema.  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  Psychiatric: Mood normal.  Nursing note and vitals reviewed.   Assessment & Plan:  Tobacco abuse HPI: Patient currently smoking 1/3 ppd. Previously quit using chantix. Wants to try again.  A: Tobacco abuse  P: Start chantix I spent 4 minutes discussing importance of smoking cessation and strategies for success.  Vitamin D deficiency HPI: reports he is taking 1000 IU of vitamin D3 daily  A: Vitamin D deficency  P: Repeat Vitamin D level>> now normal will continue current supplementation dose.  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  He is pleased his A1c is down to 7.8%.  He notes that he has recently started  trying to get his GED, this has led him to eat healthier and start exercising more (walking around campus).  He feels like he can take more control of his medical problems now.  A: Uncontrolled type 2 DM with retinopathy, neuropathy  P: -Continue Metformin 1g BID Continue invokana 300mg  daily -Continue glipizde 10mg  BID -Encouraged weight loss and exercise,  If not at goal next visit will consider adding GLP-1 agonist. -Continue lyrica 100mg  TID for neuropathy. - Continued follow up with Dr Arlina Robes for optho and Dr Prudence Davidson for podiatry.  COPD with asthma ([place]) HPI: Since our last visit he had to change to [place] per his insurance formulary.  He is doing well with this and has no complaints.  HE did go to the ED once for COPD exacerbation, was treated with nebulizers and steroid injection.  He did not take prescribed prednisone due to concern about it worsening his DM.  However he notes that he recovered without the prednisone.  A: COPD with asthma  P: Cotnine breo-elipta, albuterol PRN -Smoking cessation  Abdominal aortic atherosclerosis ([place]) Review of pervious imaging revelaed that he was noted to have abdominal aortic atherosclerosis on CT back in 2015. He is currently appropriately treated with Diona Fanti 81mg  daily and Atorvastatin 40mg  daily given he also has CAD.   Medications Ordered Meds ordered this encounter  Medications  . varenicline (CHANTIX STARTING MONTH PAK) 0.5 MG X 11 & 1 MG X 42 tablet    Sig: Take 0.5 mg PO once daily for 3 days, then increase to 0.5 mg tablet twice daily for 4 days, then  increase to 1 mg tablet twice daily.    Dispense:  53 tablet    Refill:  0  . varenicline (CHANTIX CONTINUING MONTH PAK) 1 MG tablet    Sig: Take 1 tablet (1 mg total) by mouth 2 (two) times daily.    Dispense:  60 tablet    Refill:  0    Continuing month, to start 02/16/17  . albuterol (PROAIR HFA) 108 (90 Base) MCG/ACT inhaler    Sig: Inhale 1 puff into the lungs every 6 (six)  hours as needed for wheezing or shortness of breath.    Dispense:  1 Inhaler    Refill:  5  . Cholecalciferol (VITAMIN D-3) 1000 units CAPS    Sig: Take 1 capsule (1,000 Units total) by mouth once.    Dispense:  1 capsule   Other Orders Orders Placed This Encounter  Procedures  . Vitamin D (25 hydroxy)  . Lipid Profile   Follow Up: Return in about 3 months (around 04/18/2017).

## 2017-01-17 ENCOUNTER — Encounter: Payer: Self-pay | Admitting: Internal Medicine

## 2017-01-17 ENCOUNTER — Encounter (INDEPENDENT_AMBULATORY_CARE_PROVIDER_SITE_OTHER): Payer: Self-pay

## 2017-01-17 ENCOUNTER — Ambulatory Visit (INDEPENDENT_AMBULATORY_CARE_PROVIDER_SITE_OTHER): Payer: Medicare HMO | Admitting: Dietician

## 2017-01-17 ENCOUNTER — Ambulatory Visit (INDEPENDENT_AMBULATORY_CARE_PROVIDER_SITE_OTHER): Payer: Medicare HMO | Admitting: Internal Medicine

## 2017-01-17 VITALS — Wt 214.3 lb

## 2017-01-17 VITALS — BP 129/73 | HR 80 | Temp 98.3°F | Ht 75.0 in

## 2017-01-17 DIAGNOSIS — I10 Essential (primary) hypertension: Secondary | ICD-10-CM

## 2017-01-17 DIAGNOSIS — I700 Atherosclerosis of aorta: Secondary | ICD-10-CM | POA: Diagnosis not present

## 2017-01-17 DIAGNOSIS — E11319 Type 2 diabetes mellitus with unspecified diabetic retinopathy without macular edema: Secondary | ICD-10-CM

## 2017-01-17 DIAGNOSIS — Z713 Dietary counseling and surveillance: Secondary | ICD-10-CM | POA: Diagnosis not present

## 2017-01-17 DIAGNOSIS — Z7984 Long term (current) use of oral hypoglycemic drugs: Secondary | ICD-10-CM

## 2017-01-17 DIAGNOSIS — E1165 Type 2 diabetes mellitus with hyperglycemia: Secondary | ICD-10-CM

## 2017-01-17 DIAGNOSIS — F17210 Nicotine dependence, cigarettes, uncomplicated: Secondary | ICD-10-CM

## 2017-01-17 DIAGNOSIS — Z720 Tobacco use: Secondary | ICD-10-CM

## 2017-01-17 DIAGNOSIS — Z7951 Long term (current) use of inhaled steroids: Secondary | ICD-10-CM

## 2017-01-17 DIAGNOSIS — I2510 Atherosclerotic heart disease of native coronary artery without angina pectoris: Secondary | ICD-10-CM | POA: Diagnosis not present

## 2017-01-17 DIAGNOSIS — J449 Chronic obstructive pulmonary disease, unspecified: Secondary | ICD-10-CM | POA: Diagnosis not present

## 2017-01-17 DIAGNOSIS — Z79899 Other long term (current) drug therapy: Secondary | ICD-10-CM

## 2017-01-17 DIAGNOSIS — L602 Onychogryphosis: Secondary | ICD-10-CM | POA: Diagnosis not present

## 2017-01-17 DIAGNOSIS — E1140 Type 2 diabetes mellitus with diabetic neuropathy, unspecified: Secondary | ICD-10-CM | POA: Diagnosis not present

## 2017-01-17 DIAGNOSIS — J4489 Other specified chronic obstructive pulmonary disease: Secondary | ICD-10-CM

## 2017-01-17 DIAGNOSIS — E11311 Type 2 diabetes mellitus with unspecified diabetic retinopathy with macular edema: Secondary | ICD-10-CM

## 2017-01-17 DIAGNOSIS — Z7982 Long term (current) use of aspirin: Secondary | ICD-10-CM | POA: Diagnosis not present

## 2017-01-17 DIAGNOSIS — E559 Vitamin D deficiency, unspecified: Secondary | ICD-10-CM

## 2017-01-17 DIAGNOSIS — R6889 Other general symptoms and signs: Secondary | ICD-10-CM | POA: Diagnosis not present

## 2017-01-17 LAB — POCT GLYCOSYLATED HEMOGLOBIN (HGB A1C): Hemoglobin A1C: 7.8

## 2017-01-17 LAB — GLUCOSE, CAPILLARY: Glucose-Capillary: 106 mg/dL — ABNORMAL HIGH (ref 65–99)

## 2017-01-17 MED ORDER — VARENICLINE TARTRATE 0.5 MG X 11 & 1 MG X 42 PO MISC: ORAL | 0 refills | Status: AC

## 2017-01-17 MED ORDER — ALBUTEROL SULFATE HFA 108 (90 BASE) MCG/ACT IN AERS: 1.0000 | INHALATION_SPRAY | Freq: Four times a day (QID) | RESPIRATORY_TRACT | 5 refills | Status: DC | PRN

## 2017-01-17 MED ORDER — VARENICLINE TARTRATE 1 MG PO TABS: 1.0000 mg | ORAL_TABLET | Freq: Two times a day (BID) | ORAL | 0 refills | Status: DC

## 2017-01-17 MED ORDER — VITAMIN D-3 25 MCG (1000 UT) PO CAPS: 1.0000 | ORAL_CAPSULE | Freq: Once | ORAL | Status: AC

## 2017-01-17 NOTE — Progress Notes (Signed)
  Medical Nutrition Therapy:  Appt start time: 0920 end time:  940 Visit # 3 this year, 7 in total  Assessment:  Primary concerns today: blood sugar control via meal planning  Mr. Colpitts is here for his follow up visit. He would like his weight to be ~ 200 and his A1C to be 6-7%.  He is smoking again and wants help quitting. He is also attenidng school 830-230 pm for his GED and plans to continue after he gets his GED to get a culinary degree. He is snacking less and eating smaller meals because his mind is on other things as well. He was prescribed steroids recently for COPD/asthma, but did not take them  ANTHROPOMETRICS: weight-214.3#, UBW- 220#, IBW ~ 210#, waist circumference 39.5".  BLOOD SUGAR: Last A1C 8.8%, today it is 7.8%, he did not bring his meter  DIETARY INTAKE: Breakfast- orange or banana or nutragrain bar Lunch- skips or today packed leftovers Dinner- he cooks a protein (meat) and vegetables  Usual physical activity: ADLs and light walking, more than 15 minutes most days now with school  Estimated energy needs: 2200-2500 calories 275-300 g carbohydrates  Progress Towards Goal(s):  In progress.   Nutritional Diagnosis:  NI-5.8.2 Excessive carbohydrate intake As related to his snacking and skipping meals improved significantly As evidenced by his decreased weight and A1C.    Intervention:  Nutrition education about [place] quitline, easy meals to grab and pack for healthy eating away from home.    Coordination of care: none   Teaching Method Utilized: Visual,, Auditory Handouts given during visit include: AVS Barriers to learning/adherence to lifestyle change:  Lack of material resources, transportation, activity in winter Demonstrated degree of understanding via:  Teach Back   Monitoring/Evaluation:  Dietary intake, exercise,meter, and body weight in 2 month(s). Dulcey Riederer, Butch Penny, RD 01/17/2017 9:00 AM.  .

## 2017-01-17 NOTE — Patient Instructions (Signed)
Way to go lowering your weight, waistline and A1C!  Here are some ideas for easy meals/food to pack when you are away from home for meals:  Meals: Whole-grain pasta salad or quinoa salad [place] and cheese sandwich on whole-wheat bread Hard-boiled egg and cheese in a whole-wheat pita Snacks: Sliced fresh fruit like melon and berries Snack bar Greek yogurt Cheese and whole-grain crackers  If you end up having to hit the quick mart anyway, look for the smarter choices:  Whole-grain pretzels Hummus cups Coffee or tea (nothing fancy) Fresh or dried fruit Small bowl of oatmeal  pack a few sandwiches and snacks such as:  Sandwiches: Cucumber and whipped cream cheese on whole-wheat bread Peanut butter and jelly on rye Grilled chicken, lettuce, tomato and mustard in a whole-grain wrap Hummus with sliced tomato, pepper and cucumber in a whole-wheat pita Snacks: String cheese Hummus cups Homemade trail mix Nonfat Greek yogurt Snack bars Whole fruit (plums, peaches, banana) Cut vegetables (carrots, bell peppers, cucumbers, celery)  Snacks on the go:   hard-boiled eggs sweet potato chips dehydrated green beans baked apple chips crispy roasted chickpeas (in Healthy Snacks to Go.now why don't you have a copy yet?) fresh fruit, cut for easy eating if necessary your favorite muffin recipe healthy pumpkin muffins banana flax muffins dried bananas (how to dehydrate fruit) homemade granola bars celery sticks with cream cheese or peanut butter (and napkins!) popcorn (mmm, butter)

## 2017-01-17 NOTE — Patient Instructions (Signed)
You are doing great with diabetes and blood pressure.  I want you to make sure you are taking about 1000 units of vitamin D3 a day.  I have sent in a prescription for chantix to help you stop smoking.

## 2017-01-18 DIAGNOSIS — I700 Atherosclerosis of aorta: Secondary | ICD-10-CM | POA: Insufficient documentation

## 2017-01-18 LAB — LIPID PANEL
CHOLESTEROL TOTAL: 114 mg/dL (ref 100–199)
Chol/HDL Ratio: 2.2 ratio (ref 0.0–5.0)
HDL: 51 mg/dL (ref >39)
LDL CALC: 50 mg/dL (ref 0–99)
TRIGLYCERIDES: 66 mg/dL (ref 0–149)
VLDL Cholesterol Cal: 13 mg/dL (ref 5–40)

## 2017-01-18 LAB — VITAMIN D 25 HYDROXY (VIT D DEFICIENCY, FRACTURES): Vit D, 25-Hydroxy: 33.5 ng/mL (ref 30.0–100.0)

## 2017-01-18 NOTE — Assessment & Plan Note (Addendum)
HPI: He is an obvious good mood when I entered.  He is pleased his A1c is down to 7.8%.  He notes that he has recently started trying to get his GED, this has led him to eat healthier and start exercising more (walking around campus).  He feels like he can take more control of his medical problems now.  A: Uncontrolled type 2 DM with retinopathy, neuropathy  P: -Continue Metformin 1g BID Continue invokana 300mg  daily -Continue glipizde 10mg  BID -Encouraged weight loss and exercise,  If not at goal next visit will consider adding GLP-1 agonist. -Continue lyrica 100mg  TID for neuropathy. - Continued follow up with Dr Arlina Robes for optho and Dr Prudence Davidson for podiatry.

## 2017-01-18 NOTE — Assessment & Plan Note (Signed)
HPI: Since our last visit he had to change to [place] per his insurance formulary.  He is doing well with this and has no complaints.  HE did go to the ED once for COPD exacerbation, was treated with nebulizers and steroid injection.  He did not take prescribed prednisone due to concern about it worsening his DM.  However he notes that he recovered without the prednisone.  A: COPD with asthma  P: Cotnine breo-elipta, albuterol PRN -Smoking cessation

## 2017-01-18 NOTE — Assessment & Plan Note (Signed)
Review of pervious imaging revelaed that he was noted to have abdominal aortic atherosclerosis on CT back in 2015. He is currently appropriately treated with Diona Fanti 81mg  daily and Atorvastatin 40mg  daily given he also has CAD.

## 2017-01-18 NOTE — Assessment & Plan Note (Signed)
HPI: reports he is taking 1000 IU of vitamin D3 daily  A: Vitamin D deficency  P: Repeat Vitamin D level>> now normal will continue current supplementation dose.

## 2017-01-18 NOTE — Assessment & Plan Note (Signed)
HPI: Patient currently smoking 1/3 ppd. Previously quit using chantix. Wants to try again.  A: Tobacco abuse  P: Start chantix I spent 4 minutes discussing importance of smoking cessation and strategies for success.

## 2017-01-22 ENCOUNTER — Telehealth: Payer: Self-pay

## 2017-01-22 NOTE — Telephone Encounter (Signed)
pregabalin (LYRICA) 100 MG capsule, refill request @ [facility] on [address]. Also please call pt back.
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
## 2017-01-23 NOTE — Telephone Encounter (Signed)
Ok thank you 

## 2017-02-11 ENCOUNTER — Emergency Department (HOSPITAL_COMMUNITY)
Admission: EM | Admit: 2017-02-11 | Discharge: 2017-02-11 | Disposition: A | Discharge status: Home / Self Care | Payer: Medicare HMO | Attending: Emergency Medicine | Admitting: Emergency Medicine

## 2017-02-11 DIAGNOSIS — E1140 Type 2 diabetes mellitus with diabetic neuropathy, unspecified: Secondary | ICD-10-CM | POA: Insufficient documentation

## 2017-02-11 DIAGNOSIS — Z7982 Long term (current) use of aspirin: Secondary | ICD-10-CM | POA: Insufficient documentation

## 2017-02-11 DIAGNOSIS — J449 Chronic obstructive pulmonary disease, unspecified: Secondary | ICD-10-CM | POA: Diagnosis not present

## 2017-02-11 DIAGNOSIS — Z79899 Other long term (current) drug therapy: Secondary | ICD-10-CM | POA: Insufficient documentation

## 2017-02-11 DIAGNOSIS — I10 Essential (primary) hypertension: Secondary | ICD-10-CM | POA: Insufficient documentation

## 2017-02-11 DIAGNOSIS — E08621 Diabetes mellitus due to underlying condition with foot ulcer: Secondary | ICD-10-CM

## 2017-02-11 DIAGNOSIS — E11621 Type 2 diabetes mellitus with foot ulcer: Secondary | ICD-10-CM | POA: Insufficient documentation

## 2017-02-11 DIAGNOSIS — L97511 Non-pressure chronic ulcer of other part of right foot limited to breakdown of skin: Secondary | ICD-10-CM | POA: Diagnosis not present

## 2017-02-11 DIAGNOSIS — Z5189 Encounter for other specified aftercare: Secondary | ICD-10-CM

## 2017-02-11 DIAGNOSIS — Z7984 Long term (current) use of oral hypoglycemic drugs: Secondary | ICD-10-CM | POA: Insufficient documentation

## 2017-02-11 DIAGNOSIS — E11622 Type 2 diabetes mellitus with other skin ulcer: Secondary | ICD-10-CM | POA: Diagnosis not present

## 2017-02-11 DIAGNOSIS — Z4800 Encounter for change or removal of nonsurgical wound dressing: Secondary | ICD-10-CM | POA: Diagnosis present

## 2017-02-11 DIAGNOSIS — Z87891 Personal history of nicotine dependence: Secondary | ICD-10-CM | POA: Diagnosis not present

## 2017-02-11 LAB — CBG MONITORING, ED: Glucose-Capillary: 211 mg/dL — ABNORMAL HIGH (ref 65–99)

## 2017-02-11 NOTE — ED Provider Notes (Signed)
[facility] DEPT Provider Note   CSN: [id number] Arrival date & time: 02/11/17  1318   By signing my name below, I, Hilbert Odor, attest that this documentation has been prepared under the direction and in the presence of Eliezer Mccoy, PA-C. Electronically Signed: Hilbert Odor, Scribe. 02/11/17. 2:45 PM. History   Chief Complaint Chief Complaint  Patient presents with  . Wound Check    Diabetic ? ulcer    The history is provided by the patient. No language interpreter was used.  HPI Comments: Richard [name] is a 56 y.o. male with hx of diabetes and neuropathy who presents to the Emergency Department after having a blister pop on the bottom of his right big toe earlier today. He also reports some clear and bloody drainage from the area recently. He wrapped the area with gauze and coband bandage. He reports current tingling in his feet but states that this is due to his hx of neuropathy. He is currently seeing a podiatrist every 3 months for his diabetes. He states that he was unable to see his podiatrist today so he decided to come to the ED for evaluation. He reports no other symptoms. He denies any fevers or pain currently.  Past Medical History:  Diagnosis Date  . Asthma   . Clostridium difficile colitis 09/09/2014  . Diabetes mellitus without complication ([place])    diagnosed in 2013  . Essential hypertension   . Glaucoma    bilateral, surgery on left eye but not right  . Hyperlipidemia   . Neuromuscular disorder (HCC)    neuropathy in feet  . 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 Substance abuse    crack - recovered x 30 yrs    Patient Active Problem List   Diagnosis Date Noted  . Abdominal aortic atherosclerosis ([place]) 01/18/2017  . URI (upper respiratory infection) 10/18/2016  . Vitamin D deficiency 07/02/2016  . Tubular adenoma of colon 02/06/2016  . Diabetic neuropathy, painful ([place]) 11/30/2015  . Dermatitis 11/30/2015  . Overweight (BMI 25.0-29.9) 11/30/2015  . CAD in  native artery 10/20/2015  . Tobacco abuse   . Essential hypertension   . 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 COPD with asthma [facility])     Past Surgical History:  Procedure Laterality Date  . APPENDECTOMY    . CARDIAC CATHETERIZATION N/A 09/26/2015   Procedure: Left Heart Cath and Coronary Angiography;  Surgeon: [name], MD;  Location: [facility];  Service: Cardiovascular;  Laterality: N/A;  . left eye surgery Left    glaucoma  . SKIN GRAFT         Home Medications    Prior to Admission medications   Medication Sig Start Date End Date Taking? Authorizing Provider  ACCU-CHEK FASTCLIX LANCETS MISC Check blood sugar two times a day 11/21/16   [name], DO  albuterol (PROAIR HFA) 108 (90 Base) MCG/ACT inhaler Inhale 1 puff into the lungs every 6 (six) hours as needed for wheezing or shortness of breath. 01/17/17   [name], DO  aspirin EC 81 MG EC tablet Take 1 tablet (81 mg total) by mouth daily. 09/26/15   Burns, Arloa Koh, MD  atorvastatin (LIPITOR) 40 MG tablet Take 1 tablet (40 mg total) by mouth daily. 06/28/16 06/28/17  [name], DO  benzonatate (TESSALON) 200 MG capsule Take 1 capsule (200 mg total) by mouth 3 (three) times daily as needed for cough.  10/18/16   [name], DO  Blood Glucose Monitoring Suppl (ACCU-CHEK GUIDE) w/Device KIT 1 each by Does not apply route 2 (two) times daily. 11/21/16   [name], DO  canagliflozin (INVOKANA) 300 MG TABS tablet Take 1 tablet (300 mg total) by mouth daily before breakfast. 10/18/16   [name], DO  fluticasone furoate-vilanterol (BREO ELLIPTA) 100-25 MCG/INH AEPB Inhale 1 puff into the lungs daily. 11/26/16   [name], DO  glipiZIDE (GLUCOTROL) 10 MG tablet Take 1 tablet (10 mg total) by mouth 2 (two) times daily. 12/18/16   [name], DO  glucose blood (ACCU-CHEK GUIDE) test strip Check blood  sugar two times a day 11/21/16   [name], DO  latanoprost (XALATAN) 0.005 % ophthalmic solution INSTILL 1 DROP INTO BOTH EYES IN THE EVENING 10/21/15   [provider]  lisinopril (PRINIVIL,ZESTRIL) 10 MG tablet Take 10 mg every night Patient taking differently: daily. Take 10 mg every night 10/20/15   Erlene Quan, PA-C  metFORMIN (GLUCOPHAGE) 1000 MG tablet Take 1 tablet (1,000 mg total) by mouth 2 (two) times daily with a meal. 06/28/16   [name], DO  pregabalin (LYRICA) 100 MG capsule Take 1 capsule (100 mg total) by mouth 3 (three) times daily. 10/17/16   [name], DO  varenicline (CHANTIX CONTINUING MONTH PAK) 1 MG tablet Take 1 tablet (1 mg total) by mouth 2 (two) times daily. 02/16/17   [name], DO  varenicline (CHANTIX STARTING MONTH PAK) 0.5 MG X 11 & 1 MG X 42 tablet Take 0.5 mg PO once daily for 3 days, then increase to 0.5 mg tablet twice daily for 4 days, then increase to 1 mg tablet twice daily. 01/17/17 02/15/17  [name], DO    Family History Family History  Problem Relation Age of Onset  . Hypertension Mother   . Colon cancer Neg Hx   . Colon polyps Neg Hx   . Esophageal cancer Neg Hx   . Rectal cancer Neg Hx   . Stomach cancer Neg Hx     Social History Social History  Substance Use Topics  . Smoking status: Former Smoker    Packs/day: 1.00    Types: Cigarettes    Quit date: 08/13/2016  . Smokeless tobacco: Former Systems developer    Quit date: 08/24/2016     Comment: 5-6 CIAGARETTES A DAY  . Alcohol use No     Allergies   Patient has no known allergies.   Review of Systems Review of Systems  Constitutional: Negative for chills and fever.  Musculoskeletal: Negative for arthralgias and myalgias.  Skin: Positive for wound.  Neurological: Positive for numbness.     Physical Exam Updated Vital Signs BP 111/71   Pulse 96   Temp 98 F (36.7 C) (Oral)   Resp 18   SpO2 99%   Physical Exam  Constitutional: He appears  well-developed and well-nourished. No distress.  HENT:  Head: Normocephalic and atraumatic.  Mouth/Throat: Oropharynx is clear and moist. No oropharyngeal exudate.  Eyes: Conjunctivae are normal. Pupils are equal, round, and reactive to light. Right eye exhibits no discharge. Left eye exhibits no discharge. No scleral icterus.  Neck: Normal range of motion. Neck supple. No thyromegaly present.  Cardiovascular: Normal rate, regular rhythm, normal heart sounds and intact distal pulses.  Exam reveals no gallop and no friction rub.   No murmur heard. Pulmonary/Chest: Effort normal and breath sounds normal. No stridor.  No respiratory distress. He has no wheezes. He has no rales.  Abdominal: Soft. Bowel sounds are normal. He exhibits no distension. There is no tenderness. There is no rebound and no guarding.  Musculoskeletal: He exhibits no edema.  Lymphadenopathy:    He has no cervical adenopathy.  Neurological: He is alert. Coordination normal.  Skin: Skin is warm and dry. No rash noted. He is not diaphoretic. No pallor.  Diabetic ulcer to right great toe; no active drainage, no surrounding erythema or edema; no tenderness to foot or wound, however sensation is decreased due to patient's underlying neuropathy  Psychiatric: He has a normal mood and affect.  Nursing note and vitals reviewed.      ED Treatments / Results  DIAGNOSTIC STUDIES: Oxygen Saturation is 99% on RA, normal by my interpretation.    COORDINATION OF CARE: 2:30 PM Discussed treatment plan with pt at bedside and pt agreed to plan. I advised him to see his podiatrist as soon as possible.  Labs (all labs ordered are listed, but only abnormal results are displayed) Labs Reviewed  CBG MONITORING, ED - Abnormal; Notable for the following:       Result Value   Glucose-Capillary 211 (*)    All other components within normal limits    EKG  EKG Interpretation None       Radiology No results  found.  Procedures Procedures (including critical care time)  Medications Ordered in ED Medications - No data to display   Initial Impression / Assessment and Plan / ED Course  I have reviewed the triage vital signs and the nursing notes.  Pertinent labs & imaging results that were available during my care of the patient were reviewed by me and considered in my medical decision making (see chart for details).     Patient with diabetic foot ulcer to right great toe. No signs of active infection. We'll initiate antibiotic ointment and stressed regular dressing changes. I also stressed the importance of foot care and checking feet daily. Patient to follow-up with podiatrist as soon as possible for recheck of wound. Strict return precautions discussed with patient. Patient understands and agrees with plan. Patient vitals stable throughout ED course and discharged in satisfactory condition. Patient also evaluated by Dr. Ashok Cordia who guided the patient's management and agrees with plan.  Final Clinical Impressions(s) / ED Diagnoses   Final diagnoses:  Visit for wound check  Diabetic ulcer of toe of right foot associated with diabetes mellitus due to underlying condition, limited to breakdown of skin [facility])    New Prescriptions New Prescriptions   No medications on file   I personally performed the services described in this documentation, which was scribed in my presence. The recorded information has been reviewed and is accurate.    Frederica Kuster, PA-C 02/11/17 1534    Lajean Saver, MD 02/18/17 [phone]

## 2017-02-11 NOTE — ED Notes (Signed)
Declined W/C at D/C and was escorted to lobby by RN. 

## 2017-02-11 NOTE — Discharge Instructions (Signed)
Change dressing daily. Apply antibiotic ointment daily. Check your feet daily to make sure that there is no redness or red streaking from the area. If so, see your foot doctor immediately or return to the emergency department. Please see your foot doctor as possible for further evaluation and treatment.

## 2017-02-11 NOTE — ED Triage Notes (Signed)
Pt had a blister pop on bottom of right big toe.  Assessed it and it appears to be a dime sized ulcer to bottom of right big toe.

## 2017-02-26 ENCOUNTER — Encounter (HOSPITAL_COMMUNITY): Payer: Self-pay

## 2017-02-26 ENCOUNTER — Ambulatory Visit: Payer: Medicare HMO

## 2017-02-26 ENCOUNTER — Emergency Department (HOSPITAL_COMMUNITY): Payer: Medicare HMO

## 2017-02-26 ENCOUNTER — Emergency Department (HOSPITAL_COMMUNITY)
Admission: EM | Admit: 2017-02-26 | Discharge: 2017-02-26 | Disposition: A | Discharge status: Home / Self Care | Payer: Medicare HMO | Attending: Emergency Medicine | Admitting: Emergency Medicine

## 2017-02-26 DIAGNOSIS — I10 Essential (primary) hypertension: Secondary | ICD-10-CM | POA: Insufficient documentation

## 2017-02-26 DIAGNOSIS — R0602 Shortness of breath: Secondary | ICD-10-CM | POA: Diagnosis not present

## 2017-02-26 DIAGNOSIS — Z7982 Long term (current) use of aspirin: Secondary | ICD-10-CM | POA: Insufficient documentation

## 2017-02-26 DIAGNOSIS — Z7984 Long term (current) use of oral hypoglycemic drugs: Secondary | ICD-10-CM | POA: Insufficient documentation

## 2017-02-26 DIAGNOSIS — J441 Chronic obstructive pulmonary disease with (acute) exacerbation: Secondary | ICD-10-CM | POA: Diagnosis not present

## 2017-02-26 DIAGNOSIS — Z79899 Other long term (current) drug therapy: Secondary | ICD-10-CM | POA: Diagnosis not present

## 2017-02-26 DIAGNOSIS — E1140 Type 2 diabetes mellitus with diabetic neuropathy, unspecified: Secondary | ICD-10-CM | POA: Diagnosis not present

## 2017-02-26 DIAGNOSIS — R079 Chest pain, unspecified: Secondary | ICD-10-CM | POA: Diagnosis not present

## 2017-02-26 DIAGNOSIS — Z87891 Personal history of nicotine dependence: Secondary | ICD-10-CM | POA: Diagnosis not present

## 2017-02-26 LAB — BASIC METABOLIC PANEL
Anion gap: 10 (ref 5–15)
BUN: 17 mg/dL (ref 6–20)
CALCIUM: 9.4 mg/dL (ref 8.9–10.3)
CHLORIDE: 101 mmol/L (ref 101–111)
CO2: 24 mmol/L (ref 22–32)
CREATININE: 1.06 mg/dL (ref 0.61–1.24)
GFR calc Af Amer: >60 mL/min (ref >60)
Glucose, Bld: 107 mg/dL — ABNORMAL HIGH (ref 65–99)
Potassium: 4 mmol/L (ref 3.5–5.1)
SODIUM: 135 mmol/L (ref 135–145)

## 2017-02-26 LAB — CBC
HCT: 41.2 % (ref 39.0–52.0)
Hemoglobin: 13.3 g/dL (ref 13.0–17.0)
MCH: 26.3 pg (ref 26.0–34.0)
MCHC: 32.3 g/dL (ref 30.0–36.0)
MCV: 81.6 fL (ref 78.0–100.0)
PLATELETS: 285 10*3/uL (ref 150–400)
RBC: 5.05 MIL/uL (ref 4.22–5.81)
RDW: 13.6 % (ref 11.5–15.5)
WBC: 5.2 10*3/uL (ref 4.0–10.5)

## 2017-02-26 LAB — I-STAT TROPONIN, ED: TROPONIN I, POC: 0 ng/mL (ref 0.00–0.08)

## 2017-02-26 MED ORDER — ALBUTEROL SULFATE (2.5 MG/3ML) 0.083% IN NEBU
5.0000 mg | INHALATION_SOLUTION | Freq: Once | RESPIRATORY_TRACT | Status: AC
  Administered 2017-02-26: 5 mg via RESPIRATORY_TRACT

## 2017-02-26 MED ORDER — PREDNISONE 10 MG PO TABS: 20.0000 mg | ORAL_TABLET | Freq: Every day | ORAL | 0 refills | Status: DC

## 2017-02-26 MED ORDER — ALBUTEROL SULFATE HFA 108 (90 BASE) MCG/ACT IN AERS: 2.0000 | INHALATION_SPRAY | RESPIRATORY_TRACT | 0 refills | Status: DC | PRN

## 2017-02-26 MED ORDER — GI COCKTAIL ~~LOC~~
30.0000 mL | Freq: Once | ORAL | Status: AC
  Administered 2017-02-26: 30 mL via ORAL
  Filled 2017-02-26: qty 30

## 2017-02-26 MED ORDER — IPRATROPIUM-ALBUTEROL 0.5-2.5 (3) MG/3ML IN SOLN
3.0000 mL | Freq: Once | RESPIRATORY_TRACT | Status: AC
  Administered 2017-02-26: 3 mL via RESPIRATORY_TRACT
  Filled 2017-02-26: qty 3

## 2017-02-26 NOTE — Discharge Instructions (Signed)
Take prednisone as directed. Use inhaler as needed.  Follow-up with her primary care doctor in the next 24-48 hours for further evaluation.  Return to the emergency department for any worsening breathing, chest pain, nausea/vomiting, sweating or any other worsening or concerning symptoms.

## 2017-02-26 NOTE — ED Triage Notes (Addendum)
Pt arrives with c/o SOB, exp wheezing and reports asthma exacerbation associated with sharp pains in the middle of his chest. Pt taking deep, labored breaths at triage but he is able to speak in clear complete sentences and O2 100&%, onset yesterday.  Pt states he used his rescue inhaler multiple times today and yesterday without relief.

## 2017-02-26 NOTE — ED Provider Notes (Signed)
[facility] DEPT [facility] Provider Note   CSN: [id number] Arrival date & time: 02/26/17  1338     History   Chief Complaint Chief Complaint  Patient presents with  . Shortness of Breath    HPI Richard [name] is a 56 y.o. male who presents with 1 week of worsening intermittent shortness of breath in 2-3 days of intermittent right-sided chest pain. Patient states that the episodes of chest pain last summer between 2-3 minutes before resolving. He states that he can go 30 minutes to 45 minutes between episodes of chest pain. He states that the chest pain feels similar to his past history of indigestion and he describes it as a "burning to the middle of my chest and goes to the right side." He denies any association with meals. He states that he was previously diagnosed with GERD and was started on Zantac and Prilosec but stopped taking them because he had not been experiencing symptoms. He denies any diaphoresis or nausea with the episodes of chest pain. He also reports that he has been recently helping his daughter move this past weekend prior to onset of pain. Patient was being evaluated by his primary care doctor upstairs when he started having acute shortness of breath consistent with his past episodes of asthma attacks. He got one nebulizer treatment prior to ED arrival. He reports over the past week that he has been using his albuterol inhaler more frequently but not having relief. He denies any recent sicknesses, fevers, nausea/vomiting, dysuria, hematuria.    The history is provided by the patient.    Past Medical History:  Diagnosis Date  . Asthma   . Clostridium difficile colitis 09/09/2014  . Diabetes mellitus without complication ([place])    diagnosed in 2013  . Essential hypertension   . Glaucoma    bilateral, surgery on left eye but not right  . Hyperlipidemia   . Neuromuscular disorder (HCC)    neuropathy in feet  . 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 Substance abuse    crack - recovered  x 30 yrs    Patient Active Problem List   Diagnosis Date Noted  . Abdominal aortic atherosclerosis ([place]) 01/18/2017  . URI (upper respiratory infection) 10/18/2016  . Vitamin D deficiency 07/02/2016  . Tubular adenoma of colon 02/06/2016  . Diabetic neuropathy, painful ([place]) 11/30/2015  . Dermatitis 11/30/2015  . Overweight (BMI 25.0-29.9) 11/30/2015  . CAD in native artery 10/20/2015  . Tobacco abuse   . Essential hypertension   . 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 COPD with asthma [facility])     Past Surgical History:  Procedure Laterality Date  . APPENDECTOMY    . CARDIAC CATHETERIZATION N/A 09/26/2015   Procedure: Left Heart Cath and Coronary Angiography;  Surgeon: [name], MD;  Location: [facility];  Service: Cardiovascular;  Laterality: N/A;  . left eye surgery Left    glaucoma  . SKIN GRAFT         Home Medications    Prior to Admission medications   Medication Sig Start Date End Date Taking? Authorizing Provider  ACCU-CHEK FASTCLIX LANCETS MISC Check blood sugar two times a day 11/21/16   [name], DO  albuterol (PROVENTIL HFA;VENTOLIN HFA) 108 (90 Base) MCG/ACT inhaler Inhale 2 puffs into the lungs every 4 (four) hours as needed for wheezing or shortness of breath. 02/27/17   Axel Filler, MD  aspirin EC  81 MG EC tablet Take 1 tablet (81 mg total) by mouth daily. 09/26/15   Burns, Arloa Koh, MD  atorvastatin (LIPITOR) 40 MG tablet Take 1 tablet (40 mg total) by mouth daily. 06/28/16 06/28/17  [name], DO  benzonatate (TESSALON) 200 MG capsule Take 1 capsule (200 mg total) by mouth 3 (three) times daily as needed for cough. 10/18/16   [name], DO  Blood Glucose Monitoring Suppl (ACCU-CHEK GUIDE) w/Device KIT 1 each by Does not apply route 2 (two) times daily. 11/21/16   [name], DO  canagliflozin (INVOKANA) 300 MG TABS tablet Take 1  tablet (300 mg total) by mouth daily before breakfast. 10/18/16   [name], DO  fluticasone furoate-vilanterol (BREO ELLIPTA) 100-25 MCG/INH AEPB Inhale 1 puff into the lungs daily. 11/26/16   [name], DO  glipiZIDE (GLUCOTROL) 10 MG tablet Take 1 tablet (10 mg total) by mouth 2 (two) times daily. 12/18/16   [name], DO  glucose blood (ACCU-CHEK GUIDE) test strip Check blood sugar two times a day 11/21/16   [name], DO  latanoprost (XALATAN) 0.005 % ophthalmic solution INSTILL 1 DROP INTO BOTH EYES IN THE EVENING 10/21/15   [provider]  lisinopril (PRINIVIL,ZESTRIL) 10 MG tablet Take 10 mg every night Patient taking differently: daily. Take 10 mg every night 10/20/15   Erlene Quan, PA-C  metFORMIN (GLUCOPHAGE) 1000 MG tablet Take 1 tablet (1,000 mg total) by mouth 2 (two) times daily with a meal. 06/28/16   He[place], Rachel Moulds, DO  predniSONE (DELTASONE) 10 MG tablet Take 2 tablets (20 mg total) by mouth daily. 02/26/17   Volanda Napoleon, PA-C  pregabalin (LYRICA) 100 MG capsule Take 1 capsule (100 mg total) by mouth 3 (three) times daily. 10/17/16   [name], DO  varenicline (CHANTIX CONTINUING MONTH PAK) 1 MG tablet Take 1 tablet (1 mg total) by mouth 2 (two) times daily. 02/16/17   [name], DO    Family History Family History  Problem Relation Age of Onset  . Hypertension Mother   . Colon cancer Neg Hx   . Colon polyps Neg Hx   . Esophageal cancer Neg Hx   . Rectal cancer Neg Hx   . Stomach cancer Neg Hx     Social History Social History  Substance Use Topics  . Smoking status: Former Smoker    Packs/day: 1.00    Types: Cigarettes    Quit date: 08/13/2016  . Smokeless tobacco: Former Systems developer    Quit date: 08/24/2016     Comment: 5-6 CIAGARETTES A DAY  . Alcohol use No     Allergies   Patient has no known allergies.   Review of Systems Review of Systems  Constitutional: Negative for chills and fever.  HENT: Negative for  congestion, rhinorrhea and sore throat.   Eyes: Negative for visual disturbance.  Respiratory: Positive for shortness of breath. Negative for cough.   Cardiovascular: Positive for chest pain.  Gastrointestinal: Negative for abdominal pain, diarrhea, nausea and vomiting.  Genitourinary: Negative for dysuria and hematuria.  Musculoskeletal: Negative for back pain and neck pain.  Skin: Negative for rash.  Neurological: Negative for dizziness, weakness, numbness and headaches.  Psychiatric/Behavioral: Negative for confusion.  All other systems reviewed and are negative.    Physical Exam Updated Vital Signs BP 135/79   Pulse 77   Temp 98.1 F (36.7 C) (Oral)   Resp 17   SpO2 93%  Physical Exam  Constitutional: He is oriented to person, place, and time. He appears well-developed and well-nourished.  Sitting comfortably on examination table.   HENT:  Head: Normocephalic and atraumatic.  Mouth/Throat: Oropharynx is clear and moist and mucous membranes are normal.  Eyes: Conjunctivae, EOM and lids are normal. Pupils are equal, round, and reactive to light.  Neck: Full passive range of motion without pain.  Cardiovascular: Normal rate, regular rhythm, normal heart sounds and normal pulses.  Exam reveals no gallop and no friction rub.   No murmur heard. Pulmonary/Chest: Effort normal. He has wheezes.  No evidence of respiratory distress. Able to speak in full sentences without difficulty. Diffuse wheezing to lower lung fields.  Tenderness palpation to the right anterior chest wall. No deformities or crepitus noted. Pain is reproducible with abduction and abduction of right upper extremity.  Abdominal: Soft. Normal appearance. There is no tenderness. There is no rigidity and no guarding.  Musculoskeletal: Normal range of motion.  Neurological: He is alert and oriented to person, place, and time.  Skin: Skin is warm and dry. Capillary refill takes less than 2 seconds.  Psychiatric: He has  a normal mood and affect. His speech is normal.  Nursing note and vitals reviewed.    ED Treatments / Results  Labs (all labs ordered are listed, but only abnormal results are displayed) Labs Reviewed  BASIC METABOLIC PANEL - Abnormal; Notable for the following:       Result Value   Glucose, Bld 107 (*)    All other components within normal limits  CBC  I-STAT TROPOININ, ED    EKG  EKG Interpretation  Date/Time:  Tuesday Feb 26 2017 13:46:19 EDT Ventricular Rate:  98 PR Interval:  150 QRS Duration: 76 QT Interval:  352 QTC Calculation: 449 R Axis:   60 Text Interpretation:  Normal sinus rhythm Possible Left atrial enlargement Borderline ECG When compared to prior, no significant chcanges seen.  No STEMI Confirmed by Antony Blackbird [phone]) on 02/26/2017 4:48:19 PM       Radiology No results found.  Procedures Procedures (including critical care time)  Medications Ordered in ED Medications  albuterol (PROVENTIL) (2.5 MG/3ML) 0.083% nebulizer solution 5 mg (5 mg Nebulization Given 02/26/17 1341)  gi cocktail (Maalox,Lidocaine,Donnatal) (30 mLs Oral Given 02/26/17 1721)  ipratropium-albuterol (DUONEB) 0.5-2.5 (3) MG/3ML nebulizer solution 3 mL (3 mLs Nebulization Given 02/26/17 1938)     Initial Impression / Assessment and Plan / ED Course  I have reviewed the triage vital signs and the nursing notes.  Pertinent labs & imaging results that were available during my care of the patient were reviewed by me and considered in my medical decision making (see chart for details).     56 year old male who presents with 1 week of intermittent shortness of breath in 2-3 days of intermittent right-sided chest pain. Patient had an asthma attack earlier this morning while being evaluated by his primary care doctor. He received 1 nebulizer  And had improvement in shortness of breath symptoms. Consider GOPD exacerbation vs ACS versus musculoskeletal strain versus GERD, especially since  this feels consistent with his past episodes of indigestion and he has been off of his Prilosec and Zantac. Consider also PE but low suspicion given history/physical. Patient has a Wells Score of 0 and is at low risk. Initial labs including CBC, troponin, EKG, chest x-ray, BMP ordered at triage.Will check additional D-Dimer Patient given GI cocktail for pain.   Labs and imaging reviewed. CBC with no  elevation of white blood cell count. BMP with slight elevation in glucose, otherwise within normal limits. Initial troponin was negative. Chest x-ray negative for any acute infectious etiology. EKG normal sinus rhythm, rate 98.  Re-evaluation: Patient reports improvement in chest pain after GI cocktail. Repeat lung exam shows that he still has some diffuse wheezing to the lower lung fields. Plan to repeat DuoNeb. D-dimer is still pending.  Patient is very frustrated that he has been waiting so long. He reports that this chest pain has completely resolved after medication. He has not had any additional shortness breath after 2 DuoNeb treatments. He reports improvement of symptoms. Explained that we are still awaiting the d-dimer.  D-dimer has still not been resulted. Nurse called down to lab and he states that they never received d-dimer.   Discussed at length with patient regarding the unresulted D-Dimer test. Clinically patient appears to be having a COPD exacerbation. His O2 sats have remained >95% on RA. He reports improvement in symptoms and has improvement in clinical wheezing after duo nebs. His pain and SOB have completely resolved while being in the department. Explained this to patient and he would like to go home without the D-Dimer resulted. Given the clinical picture, I feel that is reasonable. Will plan to provide symptomatic treatment for COPD exacerbation. Instructed patient to follow-up with PCP in 2 days. Return precautions discussed. Patient expresses understanding and agreement to plan.     Final Clinical Impressions(s) / ED Diagnoses   Final diagnoses:  COPD exacerbation ([place])    New Prescriptions Discharge Medication List as of 02/26/2017  8:26 PM    START taking these medications   Details  predniSONE (DELTASONE) 10 MG tablet Take 2 tablets (20 mg total) by mouth daily., Starting Tue 02/26/2017, Print         Volanda Napoleon, PA-C 02/28/17 2145    Tegeler, Gwenyth Allegra, MD 03/06/17 1255

## 2017-02-27 ENCOUNTER — Other Ambulatory Visit: Payer: Self-pay | Admitting: Student in an Organized Health Care Education/Training Program

## 2017-02-27 ENCOUNTER — Other Ambulatory Visit: Payer: Self-pay

## 2017-02-27 MED ORDER — ALBUTEROL SULFATE HFA 108 (90 BASE) MCG/ACT IN AERS: 2.0000 | INHALATION_SPRAY | RESPIRATORY_TRACT | 0 refills | Status: DC | PRN

## 2017-03-05 ENCOUNTER — Other Ambulatory Visit: Payer: Self-pay

## 2017-03-05 DIAGNOSIS — E1165 Type 2 diabetes mellitus with hyperglycemia: Principal | ICD-10-CM

## 2017-03-05 DIAGNOSIS — E11311 Type 2 diabetes mellitus with unspecified diabetic retinopathy with macular edema: Secondary | ICD-10-CM

## 2017-03-05 MED ORDER — ONETOUCH DELICA LANCING DEV MISC: 1.0000 [IU] | Freq: Two times a day (BID) | 11 refills | Status: DC

## 2017-03-05 NOTE — Telephone Encounter (Signed)
Sent in Rx for one touch delica lancets

## 2017-03-05 NOTE — Telephone Encounter (Signed)
Received fax from [facility] refill request for One touch Delica 47X Lancets 252'V

## 2017-03-06 ENCOUNTER — Ambulatory Visit (INDEPENDENT_AMBULATORY_CARE_PROVIDER_SITE_OTHER): Payer: Medicare HMO | Admitting: Internal Medicine

## 2017-03-06 VITALS — BP 129/72 | HR 88 | Temp 97.5°F | Wt 215.8 lb

## 2017-03-06 DIAGNOSIS — F17211 Nicotine dependence, cigarettes, in remission: Secondary | ICD-10-CM

## 2017-03-06 DIAGNOSIS — N529 Male erectile dysfunction, unspecified: Secondary | ICD-10-CM | POA: Diagnosis not present

## 2017-03-06 DIAGNOSIS — J449 Chronic obstructive pulmonary disease, unspecified: Secondary | ICD-10-CM

## 2017-03-06 DIAGNOSIS — E1169 Type 2 diabetes mellitus with other specified complication: Secondary | ICD-10-CM | POA: Insufficient documentation

## 2017-03-06 MED ORDER — SILDENAFIL CITRATE 20 MG PO TABS: ORAL_TABLET | ORAL | 0 refills | Status: DC

## 2017-03-06 MED ORDER — ALBUTEROL SULFATE HFA 108 (90 BASE) MCG/ACT IN AERS: 2.0000 | INHALATION_SPRAY | RESPIRATORY_TRACT | 0 refills | Status: DC | PRN

## 2017-03-06 MED ORDER — FLUTICASONE FUROATE-VILANTEROL 100-25 MCG/INH IN AEPB: 1.0000 | INHALATION_SPRAY | Freq: Every day | RESPIRATORY_TRACT | 5 refills | Status: DC

## 2017-03-06 NOTE — Assessment & Plan Note (Signed)
The patient presents with a new complaint of erectile dysfunction. He states he has not had sex within the last year but recently has gotten into a new relationship and has tried twice but been unable to maintain an erection. He states he gets "halfway there" but is unable to go further. He tried to masturbate on his own and was able to ejaculate but did not achieve an erection. He has no signs or symptoms to suggest underlying depression. He states he is very happy with his relationship and has never been happier. At this time the most likely etiology for the patient's erectile dysfunction is secondary to diabetes and microvascular damage. He'll most likely benefit from a vasodilator such as sildenafil. I will send this to a drugstore in [place] which should be affordable for the patient. He is advised to take 2-3 20 mg tablets as needed to achieve a successful erection for intercourse. He endorsed understanding. -- Sildenafil 20 mg as it is cheaper and I will instruct the patient to take 2-3 pills as needed to achieve a successful erection

## 2017-03-06 NOTE — Telephone Encounter (Signed)
Is there a problem with the lancets that I sent in yesterday?

## 2017-03-06 NOTE — Patient Instructions (Signed)
It was a pleasure seeing you today. Thank you for choosing [facility] for your healthcare needs.   I have started a new medication called sildenafil. You're to take 2-3 pills as needed to achieve an erection. Please do not take more than 3 pills at a time.  This has been sent to a drugstore in [place].

## 2017-03-06 NOTE — Progress Notes (Signed)
   CC: Erectile dysfunction  HPI: [name] is a 56 y.o. male with a past medical history as listed below who presents with erectile dysfunction.  Past Medical History:  Diagnosis Date  . Asthma   . Clostridium difficile colitis 09/09/2014  . Diabetes mellitus without complication ([place])    diagnosed in 2013  . Essential hypertension   . Glaucoma    bilateral, surgery on left eye but not right  . Hyperlipidemia   . Neuromuscular disorder (HCC)    neuropathy in feet  . 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 Substance abuse    crack - recovered x 30 yrs     Review of Systems: Patient endorses erectile dysfunction. He denies polyuria or dysuria. He denies any depressive symptoms. Physical Exam: Vitals:   03/06/17 1547  BP: 129/72  Pulse: 88  Temp: 97.5 F (36.4 C)  TempSrc: Oral  SpO2: 99%  Weight: 215 lb 12.8 oz (97.9 kg)   BP 129/72 (BP Location: Left Arm, Patient Position: Sitting, Cuff Size: Normal)   Pulse 88   Temp 97.5 F (36.4 C) (Oral)   Wt 215 lb 12.8 oz (97.9 kg)   SpO2 99%   BMI 26.97 kg/m  General appearance: alert and cooperative Head: Normocephalic, without obvious abnormality, atraumatic Lungs: clear to auscultation bilaterally Heart: regular rate and rhythm, S1, S2 normal, no murmur, click, rub or gallop Abdomen: soft, non-tender; bowel sounds normal; no masses,  no organomegaly  Assessment & Plan:  See encounters tab for problem based medical decision making. Patient discussed with Dr. Angelia Mould  Signed: Ophelia Shoulder, MD 03/06/2017, 4:00 PM  Pager: [phone]

## 2017-03-06 NOTE — Assessment & Plan Note (Signed)
Patient presents after an emergency department visit for an asthma exacerbation. He states he was out of his inhalers. These have been refilled for him. -- Inhalers refilled

## 2017-03-07 ENCOUNTER — Other Ambulatory Visit: Payer: Self-pay

## 2017-03-07 MED ORDER — PREGABALIN 100 MG PO CAPS: 100.0000 mg | ORAL_CAPSULE | Freq: Three times a day (TID) | ORAL | 5 refills | Status: DC

## 2017-03-07 NOTE — Telephone Encounter (Signed)
Please phone in

## 2017-03-08 ENCOUNTER — Telehealth: Payer: Self-pay

## 2017-03-08 NOTE — Telephone Encounter (Signed)
Lyrica called in to [facility]

## 2017-03-11 NOTE — Progress Notes (Signed)
Internal Medicine Clinic Attending  Case discussed with Dr. Taylor at the time of the visit.  We reviewed the resident's history and exam and pertinent patient test results.  I agree with the assessment, diagnosis, and plan of care documented in the resident's note. 

## 2017-03-12 ENCOUNTER — Telehealth: Payer: Self-pay

## 2017-03-12 NOTE — Telephone Encounter (Signed)
I don't know of any cheaper options.   I will cc Dr. Maudie Mercury on this email.   No need for me to call him now since I have no solution. I thought that pharmacy in [place] was the cheapest option.   Dr. Maudie Mercury do you know any where Viagra can be purchased at a more affordable price than $100.00?

## 2017-03-12 NOTE — Telephone Encounter (Signed)
sildenafil (REVATIO) 20 MG tablet, cost $100.00, unable to purchased med. Per patient want something cheaper. Please call pt back.

## 2017-03-12 NOTE — Telephone Encounter (Signed)
It would be around $20 with the GoodRx card.

## 2017-03-13 NOTE — Telephone Encounter (Signed)
Pt called - he will pick up GoodRx card today after school.

## 2017-03-13 NOTE — Telephone Encounter (Signed)
Thank you all for your help with this gentlemen.

## 2017-04-05 ENCOUNTER — Emergency Department (HOSPITAL_COMMUNITY): Payer: Medicare HMO

## 2017-04-05 ENCOUNTER — Encounter (HOSPITAL_COMMUNITY): Payer: Self-pay | Admitting: *Deleted

## 2017-04-05 ENCOUNTER — Emergency Department (HOSPITAL_COMMUNITY)
Admission: EM | Admit: 2017-04-05 | Discharge: 2017-04-05 | Disposition: A | Discharge status: Home / Self Care | Payer: Medicare HMO | Attending: Emergency Medicine | Admitting: Emergency Medicine

## 2017-04-05 DIAGNOSIS — Z87891 Personal history of nicotine dependence: Secondary | ICD-10-CM | POA: Insufficient documentation

## 2017-04-05 DIAGNOSIS — J449 Chronic obstructive pulmonary disease, unspecified: Secondary | ICD-10-CM | POA: Insufficient documentation

## 2017-04-05 DIAGNOSIS — S91301A Unspecified open wound, right foot, initial encounter: Secondary | ICD-10-CM | POA: Diagnosis not present

## 2017-04-05 DIAGNOSIS — I2510 Atherosclerotic heart disease of native coronary artery without angina pectoris: Secondary | ICD-10-CM | POA: Insufficient documentation

## 2017-04-05 DIAGNOSIS — E11621 Type 2 diabetes mellitus with foot ulcer: Secondary | ICD-10-CM | POA: Diagnosis not present

## 2017-04-05 DIAGNOSIS — E1140 Type 2 diabetes mellitus with diabetic neuropathy, unspecified: Secondary | ICD-10-CM | POA: Insufficient documentation

## 2017-04-05 DIAGNOSIS — L97511 Non-pressure chronic ulcer of other part of right foot limited to breakdown of skin: Secondary | ICD-10-CM

## 2017-04-05 DIAGNOSIS — M79671 Pain in right foot: Secondary | ICD-10-CM | POA: Diagnosis present

## 2017-04-05 LAB — CBC WITH DIFFERENTIAL/PLATELET
Basophils Absolute: 0.1 10*3/uL (ref 0.0–0.1)
Basophils Relative: 1 %
EOS ABS: 0.6 10*3/uL (ref 0.0–0.7)
Eosinophils Relative: 11 %
HEMATOCRIT: 40.6 % (ref 39.0–52.0)
HEMOGLOBIN: 13.3 g/dL (ref 13.0–17.0)
LYMPHS ABS: 1.2 10*3/uL (ref 0.7–4.0)
LYMPHS PCT: 26 %
MCH: 27 pg (ref 26.0–34.0)
MCHC: 32.8 g/dL (ref 30.0–36.0)
MCV: 82.5 fL (ref 78.0–100.0)
MONOS PCT: 8 %
Monocytes Absolute: 0.4 10*3/uL (ref 0.1–1.0)
Neutro Abs: 2.6 10*3/uL (ref 1.7–7.7)
Neutrophils Relative %: 54 %
Platelets: 248 10*3/uL (ref 150–400)
RBC: 4.92 MIL/uL (ref 4.22–5.81)
RDW: 14.1 % (ref 11.5–15.5)
WBC: 4.8 10*3/uL (ref 4.0–10.5)

## 2017-04-05 LAB — BASIC METABOLIC PANEL
Anion gap: 9 (ref 5–15)
BUN: 16 mg/dL (ref 6–20)
CHLORIDE: 104 mmol/L (ref 101–111)
CO2: 22 mmol/L (ref 22–32)
CREATININE: 1.02 mg/dL (ref 0.61–1.24)
Calcium: 9.4 mg/dL (ref 8.9–10.3)
GFR calc Af Amer: >60 mL/min (ref >60)
GFR calc non Af Amer: >60 mL/min (ref >60)
GLUCOSE: 101 mg/dL — AB (ref 65–99)
POTASSIUM: 4.8 mmol/L (ref 3.5–5.1)
SODIUM: 135 mmol/L (ref 135–145)

## 2017-04-05 NOTE — Discharge Instructions (Signed)
You were seen in the ED today with a foot ulcer. We performed an x-ray and labs which do not show any infection at this time. You will need to see a Podiatrist at their earliest appointment. Call the provider listed to see if they can see you sooner.   Stay off the foot to help with healing and continue dressing changes daily. Return to the ED with any foot swelling, redness, wound drainage, fever, or chills.

## 2017-04-05 NOTE — ED Triage Notes (Signed)
Pt c/o x 2 blisters on R foot, one on the big toe and one on the heel of foot, pt reports pain with ambulation, pt is a diabetic who is unable to see podiatrist until 7/18 & his DM visit is next mth, reports bleeding from the area, A&O x4

## 2017-04-05 NOTE — ED Provider Notes (Signed)
Emergency Department Provider Note   I have reviewed the triage vital signs and the nursing notes.   HISTORY  Chief Complaint Foot Pain   HPI Richard [name] is a 56 y.o. male with PMH of DM, HLD, c. Diff, and neuropathy presents to the emergency department for evaluation of worsening wound to the bottom of the right foot. Patient has had increasing pain in that area over the past 1-2 weeks. He states he's been walking more because he is going to school. He continues to dress the wound and has an appointment with his podiatrist on 04/24/17. Denies any fevers or chills. No foot swelling. No noticeable redness. No radiation of symptoms. Does have baseline LE neuropathy that is not significantly worse.    Past Medical History:  Diagnosis Date  . Asthma   . Clostridium difficile colitis 09/09/2014  . Diabetes mellitus without complication ([place])    diagnosed in 2013  . Essential hypertension   . Glaucoma    bilateral, surgery on left eye but not right  . Hyperlipidemia   . Neuromuscular disorder (HCC)    neuropathy in feet  . 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 Substance abuse    crack - recovered x 30 yrs    Patient Active Problem List   Diagnosis Date Noted  . Erectile dysfunction 03/06/2017  . Abdominal aortic atherosclerosis ([place]) 01/18/2017  . URI (upper respiratory infection) 10/18/2016  . Vitamin D deficiency 07/02/2016  . Tubular adenoma of colon 02/06/2016  . Diabetic neuropathy, painful ([place]) 11/30/2015  . Dermatitis 11/30/2015  . Overweight (BMI 25.0-29.9) 11/30/2015  . CAD in native artery 10/20/2015  . Tobacco abuse   . Essential hypertension   . 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 APPENDECTOMY    . CARDIAC CATHETERIZATION N/A 09/26/2015   Procedure: Left Heart Cath and Coronary Angiography;  Surgeon:  [name], MD;  Location: [facility];  Service: Cardiovascular;  Laterality: N/A;  . left eye surgery Left    glaucoma  . SKIN GRAFT      Current Outpatient Rx  . Order #: [id number] Class: Normal  . Order #: [id number] Class: Normal  . Order #: [id number] Class: Normal  . Order #: [id number] Class: Normal  . Order #: [id number] Class: Normal  . Order #: [id number] Class: Normal  . Order #: [id number] Class: Historical Med  . Order #: [id number] Class: Normal  . Order #: [id number] Class: Normal  . Order #: [id number] Class: Phone In  . Order #: [id number] Class: Normal  . Order #: [id number] Class: Normal  . Order #: [id number] Class: Normal  . Order #: [id number] Class: Print    Allergies Patient has no known allergies.  Family History  Problem Relation Age of Onset  . Hypertension Mother   . Colon cancer Neg Hx   . Colon polyps Neg Hx   . Esophageal cancer Neg Hx   . Rectal cancer Neg Hx   . Stomach cancer Neg Hx     Social History Social History  Substance Use Topics  . Smoking status: Former Smoker    Packs/day: 1.00    Types: Cigarettes    Quit date: 08/13/2016  . Smokeless tobacco: Former Systems developer    Quit date: 08/24/2016     Comment: 5-6 CIAGARETTES A DAY  . Alcohol use No    Review of Systems  Constitutional: No fever/chills Eyes:  No visual changes. ENT: No sore throat. Cardiovascular: Denies chest pain. Respiratory: Denies shortness of breath. Gastrointestinal: No abdominal pain.  No nausea, no vomiting.  No diarrhea.  No constipation. Genitourinary: Negative for dysuria. Musculoskeletal: Negative for back pain. Skin: Positive right foot ulcer and pain.  Neurological: Negative for headaches, focal weakness or numbness.  10-point ROS otherwise negative.  ____________________________________________   PHYSICAL EXAM:  VITAL SIGNS: ED Triage Vitals  Enc Vitals Group     BP 04/05/17 0901 123/78     Pulse Rate 04/05/17 0901 92     Resp 04/05/17 0901 18       Temp 04/05/17 0901 97.9 F (36.6 C)     Temp Source 04/05/17 0901 Oral     SpO2 04/05/17 0901 98 %     Pain Score 04/05/17 0918 9    Constitutional: Alert and oriented. Well appearing and in no acute distress. Eyes: Conjunctivae are normal. Head: Atraumatic. Nose: No congestion/rhinnorhea. Mouth/Throat: Mucous membranes are moist. Neck: No stridor.   Cardiovascular: Normal rate, regular rhythm. Good peripheral circulation. Grossly normal heart sounds.   Respiratory: Normal respiratory effort.  No retractions. Lungs CTAB. Gastrointestinal: Soft and nontender. No distention.  Musculoskeletal: No lower extremity tenderness nor edema. No gross deformities of extremities. Neurologic:  Normal speech and language. No gross motor deficits. Decreased sensation to palpation of the B/L LEs from the ankles distally.  Skin:  Skin is warm and dry. Sole of right foot with skin thickening and 1 x 1 cm ulceration to the ball of the foot. No drainage, swelling, or surrounding erythema. Normal surrounding skin. No visible involvement of muscle or bone. Smaller 0.5 cm area of focal erythema at the base of the right great toe. No ulceration or erythema.Normal DP pulse on the right foot.    ____________________________________________   LABS (all labs ordered are listed, but only abnormal results are displayed)  Labs Reviewed  BASIC METABOLIC PANEL - Abnormal; Notable for the following:       Result Value   Glucose, Bld 101 (*)    All other components within normal limits  CBC WITH DIFFERENTIAL/PLATELET   ____________________________________________  RADIOLOGY  Dg Foot Complete Right  Result Date: 04/05/2017 CLINICAL DATA:  Right foot wound with drainage, initial encounter EXAM: RIGHT FOOT COMPLETE - 3+ VIEW COMPARISON:  None. FINDINGS: Soft tissue wound is noted with air within. No bony erosive changes to suggest osteomyelitis are noted. No other focal abnormality is seen. IMPRESSION: Soft  tissue wound without definitive osteomyelitis. Electronically Signed   By: Inez Catalina M.D.   On: 04/05/2017 12:18    ____________________________________________   PROCEDURES  Procedure(s) performed:   Procedures  None ____________________________________________   INITIAL IMPRESSION / ASSESSMENT AND PLAN / ED COURSE  Pertinent labs & imaging results that were available during my care of the patient were reviewed by me and considered in my medical decision making (see chart for details).  Patient presents to the emergency department for evaluation of wound to the bottom of the right foot. The area is nonerythematous with no fluctuance or drainage. There is a second, non-open wound to the bottom of the right great toe. Normal TP pulse on exam. Baseline sensory deficits in the foot. There is no foul odor to the foot. Plan for x-ray to rule out osteomyelitis and will obtain basic lab work. Plan for wound care and crutches to help keep pressure off the wound. If workup is negative for infection or osteomyelitis would consider close podiatry  follow-up with no antibiotics at this time.   Normal plain films and labs. I dressed the ulcer with gauze and provided crutches to keep weight off the wound. Provided f/u information for Podiatry. He will call to see if a sooner apt is available. Discussed signs/symptoms of infected ulcer and return precautions in detail.   At this time, I do not feel there is any life-threatening condition present. I have reviewed and discussed all results (EKG, imaging, lab, urine as appropriate), exam findings with patient. I have reviewed nursing notes and appropriate previous records.  I feel the patient is safe to be discharged home without further emergent workup. Discussed usual and customary return precautions. Patient and family (if present) verbalize understanding and are comfortable with this plan.  Patient will follow-up with their primary care provider. If  they do not have a primary care provider, information for follow-up has been provided to them. All questions have been answered.  ____________________________________________  FINAL CLINICAL IMPRESSION(S) / ED DIAGNOSES  Final diagnoses:  Skin ulcer of right foot, limited to breakdown of skin ([place])    MEDICATIONS GIVEN DURING THIS VISIT:  None  NEW OUTPATIENT MEDICATIONS STARTED DURING THIS VISIT:  None   Note:  This document was prepared using Dragon voice recognition software and may include unintentional dictation errors.  Nanda Quinton, MD Emergency Medicine   Long, [facility], MD 04/05/17 2011

## 2017-04-05 NOTE — ED Notes (Signed)
ED Provider at bedside. 

## 2017-04-11 ENCOUNTER — Ambulatory Visit (INDEPENDENT_AMBULATORY_CARE_PROVIDER_SITE_OTHER): Payer: Medicare HMO | Admitting: Podiatry

## 2017-04-11 ENCOUNTER — Encounter: Payer: Self-pay | Admitting: Podiatry

## 2017-04-11 DIAGNOSIS — E11621 Type 2 diabetes mellitus with foot ulcer: Secondary | ICD-10-CM | POA: Diagnosis not present

## 2017-04-11 DIAGNOSIS — M79676 Pain in unspecified toe(s): Secondary | ICD-10-CM | POA: Diagnosis not present

## 2017-04-11 DIAGNOSIS — B351 Tinea unguium: Secondary | ICD-10-CM | POA: Diagnosis not present

## 2017-04-11 DIAGNOSIS — L97511 Non-pressure chronic ulcer of other part of right foot limited to breakdown of skin: Secondary | ICD-10-CM | POA: Diagnosis not present

## 2017-04-11 DIAGNOSIS — L97509 Non-pressure chronic ulcer of other part of unspecified foot with unspecified severity: Secondary | ICD-10-CM

## 2017-04-11 LAB — HM DIABETES FOOT EXAM

## 2017-04-11 NOTE — Progress Notes (Signed)
This patient presents the office for evaluation of his 2 diabetic ulcers on his right foot. He says that he was seen in the emergency room and are only Monday for a callus that was developing under his right big toe.  He then says he was seen in the emergency room for diabetic ulcer under the right forefoot.  He was evaluated in the emergency room and no  evidence of osteomyelitis was noted.  He presents the office today for an evaluation of these 2 diabetic ulcers that are present on his right foot. He says that both the ulcers have been breaking down and there has been bleeding and drainage from both ulcers.  He presents the office today wearing no bandage. since there has been no drainage at either site of the ulcer for the last 2 weeks.  He says he has been soaking his right foot in Epsom salts until he presented to this office for this appointment.  He presents for an evaluation and treatment of his 2 diabetic ulcers.  He also says he has long thick painful nails  both feet.     GENERAL APPEARANCE: Alert, conversant. Appropriately groomed. No acute distress.  VASCULAR: Pedal pulses are  palpable at  [facility] and PT bilateral.  Capillary refill time is immediate to all digits,  Normal temperature gradient.  Digital hair growth is present bilateral  NEUROLOGIC: sensation is absent  to 5.07 monofilament at 5/5 sites bilateral.  Light touch is absent  bilateral, Muscle strength normal.  MUSCULOSKELETAL: acceptable muscle strength, tone and stability bilateral.  Intrinsic muscluature intact bilateral.  Rectus appearance of foot and digits noted bilateral.  NAILS  thick disfigured discolored nails with subungual debris with no evidence of bacterial infection DERMATOLOGIC: skin color, texture, and turgor are within normal limits except at the sites of the 2 diabetic ulcers.  The first ulcer reveals a 5 x 5 mm ulcer under the medial aspect of the right hallux. This ulcer is covered with hyperkeratotic roofing.  There  is no redness, swelling or infection or drainage noted.  The diabetic ulcer under the third  metatarsal of the right foot measures 20 mm x 8 mm.  This ulcer is covered with a hyperkeratotic roof and necrotic tissue.  There is no evidence of any redness, swelling or infection or drainage noted  Onychomycosis  B/L  Diabetic ulcers  Right foot.   [place] of long thick painful nails.  Debridement  of necrotic tissue at both diabetic ulcer sites.  The ulcers were then bandaged with Neosporin and a dry sterile dressing and Coban.  Home instructions for soaks was given this patient.  Patient was instructed to go home and to wear his Cam Walker for the next week.  He should return to the office in one week for further evaluation and treatment of his diabetic ulcers.   Gardiner Barefoot DPM

## 2017-04-16 ENCOUNTER — Encounter: Payer: Self-pay | Admitting: Internal Medicine

## 2017-04-17 ENCOUNTER — Ambulatory Visit: Payer: Medicare HMO | Admitting: Podiatry

## 2017-04-18 ENCOUNTER — Ambulatory Visit (INDEPENDENT_AMBULATORY_CARE_PROVIDER_SITE_OTHER): Payer: Medicare HMO | Admitting: Internal Medicine

## 2017-04-18 ENCOUNTER — Encounter: Payer: Self-pay | Admitting: Internal Medicine

## 2017-04-18 VITALS — BP 104/85 | HR 86 | Temp 98.2°F | Ht 75.0 in | Wt 208.0 lb

## 2017-04-18 DIAGNOSIS — F17210 Nicotine dependence, cigarettes, uncomplicated: Secondary | ICD-10-CM

## 2017-04-18 DIAGNOSIS — E1121 Type 2 diabetes mellitus with diabetic nephropathy: Secondary | ICD-10-CM

## 2017-04-18 DIAGNOSIS — E1165 Type 2 diabetes mellitus with hyperglycemia: Secondary | ICD-10-CM

## 2017-04-18 DIAGNOSIS — N529 Male erectile dysfunction, unspecified: Secondary | ICD-10-CM | POA: Diagnosis not present

## 2017-04-18 DIAGNOSIS — Z8249 Family history of ischemic heart disease and other diseases of the circulatory system: Secondary | ICD-10-CM | POA: Diagnosis not present

## 2017-04-18 DIAGNOSIS — E11311 Type 2 diabetes mellitus with unspecified diabetic retinopathy with macular edema: Secondary | ICD-10-CM | POA: Diagnosis not present

## 2017-04-18 DIAGNOSIS — Z720 Tobacco use: Secondary | ICD-10-CM

## 2017-04-18 DIAGNOSIS — I10 Essential (primary) hypertension: Secondary | ICD-10-CM

## 2017-04-18 DIAGNOSIS — Z794 Long term (current) use of insulin: Secondary | ICD-10-CM

## 2017-04-18 DIAGNOSIS — Z79899 Other long term (current) drug therapy: Secondary | ICD-10-CM | POA: Diagnosis not present

## 2017-04-18 LAB — GLUCOSE, CAPILLARY: Glucose-Capillary: 212 mg/dL — ABNORMAL HIGH (ref 65–99)

## 2017-04-18 LAB — POCT GLYCOSYLATED HEMOGLOBIN (HGB A1C): Hemoglobin A1C: 8.6

## 2017-04-18 MED ORDER — VARENICLINE TARTRATE 1 MG PO TABS: 1.0000 mg | ORAL_TABLET | Freq: Two times a day (BID) | ORAL | 0 refills | Status: DC

## 2017-04-18 MED ORDER — SILDENAFIL CITRATE 100 MG PO TABS: 100.0000 mg | ORAL_TABLET | ORAL | 2 refills | Status: DC | PRN

## 2017-04-18 NOTE — Progress Notes (Signed)
[facility] Subjective:  HPI: [name] is a 56 y.o. male who presents for follow up of DM.  Please see Assessment and Plan below for the status of his chronic medical problems.  Review of Systems: Review of Systems  Constitutional: Negative for chills, fever and weight loss.  Respiratory: Negative for cough.   Genitourinary: Negative for frequency.  Endo/Heme/Allergies: Negative for polydipsia.   Objective:  Physical Exam: Vitals:   04/18/17 1008  BP: 104/85  Pulse: 86  Temp: 98.2 F (36.8 C)  TempSrc: Oral  SpO2: 100%  Weight: 208 lb (94.3 kg)  Height: 6\' 3"  (1.905 m)  Physical Exam  Constitutional: He is well-developed, well-nourished, and in no distress. No distress.  HENT:  Head: Normocephalic and atraumatic.  Eyes: Conjunctivae are normal.  Cardiovascular: Normal rate, regular rhythm, normal heart sounds and intact distal pulses.   No murmur heard. Pulmonary/Chest: Effort normal and breath sounds normal. No respiratory distress. He has no wheezes. He has no rales.  Abdominal: Soft. Bowel sounds are normal. He exhibits no distension. There is no tenderness.  Musculoskeletal: He exhibits no edema.  Skin: Skin is warm and dry. He is not diaphoretic.  Psychiatric: Affect and judgment normal.  Nursing note and vitals reviewed.   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 He is frustrated by this. For example he reports his blood sugar of 212 today despite fasting.  Upon further questioning he ate 2 hamburgers last night. He did recently see Dr Richard [name] for diabetic foot ulcers, he notes this is healing well.  A: Uncontrolled type 2 DM with retinopathy,  neuropathy  P: -Continue Metformin 1g BID -Continue invokana 300mg  daily -Continue glipizde 10mg  BID (discussed we may remove this in the near future as likely less effective at this point) -Encouraged weight loss and exercise, discussed starting GLP-1 agonist patient wants to avoid injections so he will try to get to goal before next visit otherwise he is agreeable to additional agent. -Continue lyrica 100mg  TID for neuropathy. - Continued follow up with Dr Richard [name] for optho and Dr Richard [name] for podiatry. - Would also like to obtain ABI at next opportunity.  Erectile dysfunction HPI: At last visit he was given prescription for slidenafil for ED.  He reports that he has a new GF and desire is there however he has not been able to obtain a full erection.  He denies spontanious errections or morning errections.  Not exactly sure when this started.  Denies depression or anxiety. He took 60mg  of slidenafil and was unable to obtain an errection 1 hour later.  A: Erectile dysfunction- likely due to arterial insuffiency  P; Instructed to increase to 100mg  of viagra.  Encouraged smoking cessation.    Tobacco abuse HPI: Chantix has worked well to curb cravings, now down to 1-2 cig a day.    A: Tobacco abuse  P: Refilled 1 month chantix and encouraged complete cessation   Medications Ordered Meds ordered this encounter  Medications  . sildenafil (VIAGRA) 100 MG tablet    Sig: Take 1 tablet (100 mg total) by mouth as needed for erectile dysfunction.    Dispense:  5 tablet    Refill:  2  . varenicline (CHANTIX CONTINUING MONTH PAK)  1 MG tablet    Sig: Take 1 tablet (1 mg total) by mouth 2 (two) times daily.    Dispense:  60 tablet    Refill:  0    Continuing month, to start 02/16/17   Other Orders Orders Placed This Encounter  Procedures  . Glucose, capillary  . POC Hbg A1C   Follow Up: Return in about 3 months (around 07/19/2017).

## 2017-04-18 NOTE — Patient Instructions (Signed)
Work on cutting out carbohydrates.  If you can get your A1c down we can avoid adding another medication.

## 2017-04-19 NOTE — Assessment & Plan Note (Signed)
HPI: Chantix has worked well to curb cravings, now down to 1-2 cig a day.    A: Tobacco abuse  P: Refilled 1 month chantix and encouraged complete cessation

## 2017-04-19 NOTE — Assessment & Plan Note (Signed)
HPI: Taking lisinopril, no issues  A: Essential HTN Controlled  P: Continue lisinopril 10mg  daily

## 2017-04-19 NOTE — Assessment & Plan Note (Addendum)
HPI: He admits his blood glucose has been less well controlled. He is frustrated by this. For example he reports his blood sugar of 212 today despite fasting.  Upon further questioning he ate 2 hamburgers last night. He did recently see Dr Prudence Davidson for diabetic foot ulcers, he notes this is healing well.  A: Uncontrolled type 2 DM with retinopathy, neuropathy  P: -Continue Metformin 1g BID -Continue invokana 300mg  daily -Continue glipizde 10mg  BID (discussed we may remove this in the near future as likely less effective at this point) -Encouraged weight loss and exercise, discussed starting GLP-1 agonist patient wants to avoid injections so he will try to get to goal before next visit otherwise he is agreeable to additional agent. -Continue lyrica 100mg  TID for neuropathy. - Continued follow up with Dr Arlina Robes for optho and Dr Prudence Davidson for podiatry. - Would also like to obtain ABI at next opportunity.

## 2017-04-19 NOTE — Assessment & Plan Note (Signed)
HPI: At last visit he was given prescription for slidenafil for ED.  He reports that he has a new GF and desire is there however he has not been able to obtain a full erection.  He denies spontanious errections or morning errections.  Not exactly sure when this started.  Denies depression or anxiety. He took 60mg  of slidenafil and was unable to obtain an errection 1 hour later.  A: Erectile dysfunction- likely due to arterial insuffiency  P; Instructed to increase to 100mg  of viagra.  Encouraged smoking cessation.

## 2017-04-25 ENCOUNTER — Ambulatory Visit: Payer: Medicare HMO | Admitting: Podiatry

## 2017-04-25 ENCOUNTER — Other Ambulatory Visit: Payer: Self-pay | Admitting: *Deleted

## 2017-04-25 MED ORDER — ATORVASTATIN CALCIUM 40 MG PO TABS: 40.0000 mg | ORAL_TABLET | Freq: Every day | ORAL | 3 refills | Status: DC

## 2017-04-25 MED ORDER — METFORMIN HCL 1000 MG PO TABS: 1000.0000 mg | ORAL_TABLET | Freq: Two times a day (BID) | ORAL | 3 refills | Status: DC

## 2017-04-25 MED ORDER — GLIPIZIDE 10 MG PO TABS: 10.0000 mg | ORAL_TABLET | Freq: Two times a day (BID) | ORAL | 0 refills | Status: DC

## 2017-04-26 ENCOUNTER — Other Ambulatory Visit: Payer: Self-pay

## 2017-04-26 ENCOUNTER — Other Ambulatory Visit: Payer: Self-pay | Admitting: *Deleted

## 2017-04-26 DIAGNOSIS — J449 Chronic obstructive pulmonary disease, unspecified: Secondary | ICD-10-CM

## 2017-04-26 MED ORDER — FLUTICASONE FUROATE-VILANTEROL 100-25 MCG/INH IN AEPB: 1.0000 | INHALATION_SPRAY | Freq: Every day | RESPIRATORY_TRACT | 3 refills | Status: DC

## 2017-04-26 NOTE — Telephone Encounter (Signed)
Needs to speak with a nurse about meds. Please call back.  

## 2017-04-26 NOTE — Telephone Encounter (Signed)
Pt changing pharmacy to [employer].  Also received call from [facility] requesting a refill on pt's "cream" and prednisone.  Pt states he only needs the [facility].  He will pick up rx from local pharmacy for now, but needs a new rx sent to [facility].Despina Hidden Cassady7/20/20184:59 PM

## 2017-04-29 MED ORDER — FLUTICASONE FUROATE-VILANTEROL 100-25 MCG/INH IN AEPB: 1.0000 | INHALATION_SPRAY | Freq: Every day | RESPIRATORY_TRACT | 3 refills | Status: DC

## 2017-05-01 ENCOUNTER — Other Ambulatory Visit: Payer: Self-pay | Admitting: *Deleted

## 2017-05-01 DIAGNOSIS — J449 Chronic obstructive pulmonary disease, unspecified: Secondary | ICD-10-CM

## 2017-05-02 MED ORDER — FLUTICASONE FUROATE-VILANTEROL 100-25 MCG/INH IN AEPB: 1.0000 | INHALATION_SPRAY | Freq: Every day | RESPIRATORY_TRACT | 0 refills | Status: DC

## 2017-05-09 ENCOUNTER — Other Ambulatory Visit: Payer: Self-pay | Admitting: Dietician

## 2017-05-09 DIAGNOSIS — E11311 Type 2 diabetes mellitus with unspecified diabetic retinopathy with macular edema: Secondary | ICD-10-CM

## 2017-05-09 DIAGNOSIS — E1165 Type 2 diabetes mellitus with hyperglycemia: Principal | ICD-10-CM

## 2017-05-09 MED ORDER — ACCU-CHEK FASTCLIX LANCETS MISC: 5 refills | Status: DC

## 2017-05-09 MED ORDER — GLUCOSE BLOOD VI STRP: ORAL_STRIP | 5 refills | Status: DC

## 2017-05-09 NOTE — Telephone Encounter (Signed)
Calls asking for refills for testing supplies be sent to [facility] mail order.

## 2017-05-16 ENCOUNTER — Encounter (HOSPITAL_COMMUNITY): Payer: Self-pay | Admitting: Emergency Medicine

## 2017-05-16 DIAGNOSIS — Z79899 Other long term (current) drug therapy: Secondary | ICD-10-CM | POA: Insufficient documentation

## 2017-05-16 DIAGNOSIS — E119 Type 2 diabetes mellitus without complications: Secondary | ICD-10-CM | POA: Insufficient documentation

## 2017-05-16 DIAGNOSIS — J449 Chronic obstructive pulmonary disease, unspecified: Secondary | ICD-10-CM | POA: Insufficient documentation

## 2017-05-16 DIAGNOSIS — M79671 Pain in right foot: Secondary | ICD-10-CM | POA: Diagnosis present

## 2017-05-16 DIAGNOSIS — I10 Essential (primary) hypertension: Secondary | ICD-10-CM | POA: Diagnosis not present

## 2017-05-16 DIAGNOSIS — L84 Corns and callosities: Secondary | ICD-10-CM | POA: Diagnosis not present

## 2017-05-16 DIAGNOSIS — I2510 Atherosclerotic heart disease of native coronary artery without angina pectoris: Secondary | ICD-10-CM | POA: Diagnosis not present

## 2017-05-16 DIAGNOSIS — S91301A Unspecified open wound, right foot, initial encounter: Secondary | ICD-10-CM | POA: Diagnosis not present

## 2017-05-16 DIAGNOSIS — Z7984 Long term (current) use of oral hypoglycemic drugs: Secondary | ICD-10-CM | POA: Insufficient documentation

## 2017-05-16 DIAGNOSIS — L03115 Cellulitis of right lower limb: Secondary | ICD-10-CM | POA: Diagnosis not present

## 2017-05-16 DIAGNOSIS — J45909 Unspecified asthma, uncomplicated: Secondary | ICD-10-CM | POA: Insufficient documentation

## 2017-05-16 DIAGNOSIS — Z87891 Personal history of nicotine dependence: Secondary | ICD-10-CM | POA: Insufficient documentation

## 2017-05-16 DIAGNOSIS — Z7982 Long term (current) use of aspirin: Secondary | ICD-10-CM | POA: Diagnosis not present

## 2017-05-16 LAB — COMPREHENSIVE METABOLIC PANEL
ALBUMIN: 3.7 g/dL (ref 3.5–5.0)
ALK PHOS: 142 U/L — AB (ref 38–126)
ALT: 44 U/L (ref 17–63)
AST: 32 U/L (ref 15–41)
Anion gap: 7 (ref 5–15)
BUN: 12 mg/dL (ref 6–20)
CO2: 26 mmol/L (ref 22–32)
Calcium: 9.2 mg/dL (ref 8.9–10.3)
Chloride: 104 mmol/L (ref 101–111)
Creatinine, Ser: 1.18 mg/dL (ref 0.61–1.24)
GFR calc Af Amer: >60 mL/min (ref >60)
GFR calc non Af Amer: >60 mL/min (ref >60)
GLUCOSE: 206 mg/dL — AB (ref 65–99)
POTASSIUM: 4.5 mmol/L (ref 3.5–5.1)
SODIUM: 137 mmol/L (ref 135–145)
Total Bilirubin: 0.3 mg/dL (ref 0.3–1.2)
Total Protein: 7.5 g/dL (ref 6.5–8.1)

## 2017-05-16 LAB — CBC WITH DIFFERENTIAL/PLATELET
BASOS PCT: 1 %
Basophils Absolute: 0 10*3/uL (ref 0.0–0.1)
EOS ABS: 0.5 10*3/uL (ref 0.0–0.7)
Eosinophils Relative: 9 %
HCT: 36.6 % — ABNORMAL LOW (ref 39.0–52.0)
HEMOGLOBIN: 11.7 g/dL — AB (ref 13.0–17.0)
Lymphocytes Relative: 19 %
Lymphs Abs: 1.1 10*3/uL (ref 0.7–4.0)
MCH: 25.8 pg — ABNORMAL LOW (ref 26.0–34.0)
MCHC: 32 g/dL (ref 30.0–36.0)
MCV: 80.6 fL (ref 78.0–100.0)
MONOS PCT: 10 %
Monocytes Absolute: 0.6 10*3/uL (ref 0.1–1.0)
Neutro Abs: 3.3 10*3/uL (ref 1.7–7.7)
Neutrophils Relative %: 61 %
Platelets: 299 10*3/uL (ref 150–400)
RBC: 4.54 MIL/uL (ref 4.22–5.81)
RDW: 14.2 % (ref 11.5–15.5)
WBC: 5.5 10*3/uL (ref 4.0–10.5)

## 2017-05-16 NOTE — ED Notes (Signed)
Patient didn't respond for vital sign reassessment

## 2017-05-16 NOTE — ED Triage Notes (Signed)
Pt st's he is diabetic and has a hole in the bottom of right foot.  St's he was seen by his MD approx 3 weeks ago but continues to hurt and now right ankle is beginning to swell

## 2017-05-17 ENCOUNTER — Emergency Department (HOSPITAL_COMMUNITY)
Admission: EM | Admit: 2017-05-17 | Discharge: 2017-05-17 | Disposition: A | Discharge status: Home / Self Care | Payer: Medicare HMO | Attending: Emergency Medicine | Admitting: Emergency Medicine

## 2017-05-17 ENCOUNTER — Emergency Department (HOSPITAL_COMMUNITY): Payer: Medicare HMO

## 2017-05-17 ENCOUNTER — Ambulatory Visit: Payer: Medicare HMO | Admitting: Dietician

## 2017-05-17 DIAGNOSIS — L03115 Cellulitis of right lower limb: Secondary | ICD-10-CM

## 2017-05-17 DIAGNOSIS — S91301A Unspecified open wound, right foot, initial encounter: Secondary | ICD-10-CM | POA: Diagnosis not present

## 2017-05-17 MED ORDER — DOXYCYCLINE HYCLATE 100 MG PO CAPS: 100.0000 mg | ORAL_CAPSULE | Freq: Two times a day (BID) | ORAL | 0 refills | Status: DC

## 2017-05-17 MED ORDER — DOXYCYCLINE HYCLATE 100 MG PO TABS
100.0000 mg | ORAL_TABLET | Freq: Once | ORAL | Status: AC
  Administered 2017-05-17: 100 mg via ORAL
  Filled 2017-05-17: qty 1

## 2017-05-17 NOTE — ED Notes (Signed)
Patient transported to X-ray 

## 2017-05-17 NOTE — ED Notes (Signed)
No answer when called for vitals. 

## 2017-05-17 NOTE — ED Provider Notes (Signed)
[facility]-EMERGENCY DEPT Provider Note   CSN: [id number] Arrival date & time: 05/16/17  1830   By signing my name below, I, Xavier Herndon, attest that this documentation has been prepared under the direction and in the presence of No att. providers found. Electronically signed, Xavier Herndon, ED Scribe. 05/17/17. 4:07 AM.  History   Chief Complaint Chief Complaint  Patient presents with  . Foot Pain   The history is provided by the patient and medical records. No language interpreter was used.    Richard [name] is a 56 y.o. male with h/o DM, HTN and substance abuse presenting to the Emergency Department concerning a wound on the bottom of the R foot that has worsened x 1 month. He expresses concern for continued bleeding from the wound and foot/ ankle swelling on the R. He states he has applied neosporin without improvement. Pt followed by Dr. Greg Meyer for podiatry; pt states Dr. Meyer cut the pt's callous during his evaluation on 04/11/2017. Pt reports recent CBG readings between 150-200. No fever. No other complaints at this time.   Past Medical History:  Diagnosis Date  . Asthma   . Clostridium difficile colitis 09/09/2014  . Diabetes mellitus without complication (HCC)    diagnosed in 2013  . Essential hypertension   . Glaucoma    bilateral, surgery on left eye but not right  . Hyperlipidemia   . Neuromuscular disorder (HCC)    neuropathy in feet  . 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 Substance abuse    crack - recovered x 30 yrs    Patient Active Problem List   Diagnosis Date Noted  . Erectile dysfunction 03/06/2017  . Abdominal aortic atherosclerosis (HCC) 01/18/2017  . URI (upper respiratory infection) 10/18/2016  . Vitamin D deficiency 07/02/2016  . Tubular adenoma of colon 02/06/2016  . Diabetic neuropathy, painful (HCC) 11/30/2015  . Overweight (BMI 25.0-29.9) 11/30/2015  . CAD in native artery 10/20/2015  . Tobacco abuse   . Essential hypertension   . 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 COPD with asthma (HCC)     Past Surgical History:  Procedure Laterality Date  . APPENDECTOMY    . CARDIAC CATHETERIZATION N/A 09/26/2015   Procedure: Left Heart Cath and Coronary Angiography;  Surgeon: Jayadeep S Varanasi, MD;  Location: [facility];  Service: Cardiovascular;  Laterality: N/A;  . left eye surgery Left    glaucoma  . SKIN GRAFT         Home Medications    Prior to Admission medications   Medication Sig Start Date End Date Taking? Authorizing Provider  ACCU-CHEK FASTCLIX LANCETS MISC Check blood sugar two times a day 05/09/17   Hoffman, Erik C, DO  albuterol (PROVENTIL HFA;VENTOLIN HFA) 108 (90 Base) MCG/ACT inhaler Inhale 2 puffs into the lungs every 4 (four) hours as needed for wheezing or shortness of breath. 03/06/17   Taylor, James, MD  aspirin EC 81 MG EC tablet Take 1 tablet (81 mg total) by mouth daily. 09/26/15   Burns, Alexa R, MD  atorvastatin (LIPITOR) 40 MG tablet Take 1 tablet (40 mg total) by mouth daily. 04/25/17 04/25/18  Hoffman, Erik C, DO  Blood Glucose Monitoring Suppl (ACCU-CHEK GUIDE) w/Device KIT 1 each by Does not apply route 2 (two) times daily. 11/21/16   Hoffman, Erik C, DO  canagliflozin (INVOKANA) 300 MG TABS tablet Take 1 tablet (300 mg total) by mouth daily before   breakfast. 10/18/16   [name], DO  fluticasone furoate-vilanterol (BREO ELLIPTA) 100-25 MCG/INH AEPB Inhale 1 puff into the lungs daily. 05/02/17   [name], DO  glipiZIDE (GLUCOTROL) 10 MG tablet Take 1 tablet (10 mg total) by mouth 2 (two) times daily. 04/25/17   [name], DO  glucose blood (ACCU-CHEK GUIDE) test strip Check blood sugar two times a day 05/09/17   [name], DO  latanoprost (XALATAN) 0.005 % ophthalmic solution INSTILL 1 DROP INTO BOTH EYES IN THE EVENING 10/21/15   [provider]  lisinopril (PRINIVIL,ZESTRIL) 10 MG tablet Take 10  mg every night Patient taking differently: daily. Take 10 mg every night 10/20/15   Erlene Quan, PA-C  metFORMIN (GLUCOPHAGE) 1000 MG tablet Take 1 tablet (1,000 mg total) by mouth 2 (two) times daily with a meal. 04/25/17   [name], DO  pregabalin (LYRICA) 100 MG capsule Take 1 capsule (100 mg total) by mouth 3 (three) times daily. 03/07/17   [name], DO  sildenafil (VIAGRA) 100 MG tablet Take 1 tablet (100 mg total) by mouth as needed for erectile dysfunction. 04/18/17 04/18/18  [name], DO  varenicline (CHANTIX CONTINUING MONTH PAK) 1 MG tablet Take 1 tablet (1 mg total) by mouth 2 (two) times daily. 04/18/17   [name], DO    Family History Family History  Problem Relation Age of Onset  . Hypertension Mother   . Colon cancer Neg Hx   . Colon polyps Neg Hx   . Esophageal cancer Neg Hx   . Rectal cancer Neg Hx   . Stomach cancer Neg Hx     Social History Social History  Substance Use Topics  . Smoking status: Former Smoker    Packs/day: 1.00    Types: Cigarettes    Quit date: 08/13/2016  . Smokeless tobacco: Former Systems developer    Quit date: 08/24/2016     Comment: 5-6 CIAGARETTES A DAY  . Alcohol use No     Allergies   Patient has no known allergies.   Review of Systems Review of Systems  Musculoskeletal: Positive for arthralgias.  All other systems reviewed and are negative.    Physical Exam Updated Vital Signs BP 117/70 (BP Location: Left Arm)   Pulse 82   Temp 98.8 F (37.1 C) (Oral)   Resp 16   Ht 6' 3" (1.905 m)   Wt 215 lb (97.5 kg)   SpO2 100%   BMI 26.87 kg/m   Physical Exam  Constitutional: He is oriented to person, place, and time. He appears well-developed and well-nourished.  HENT:  Head: Normocephalic and atraumatic.  Eyes: EOM are normal.  Neck: Normal range of motion.  Cardiovascular: Normal rate, regular rhythm, normal heart sounds and intact distal pulses.   Pulmonary/Chest: Effort normal and breath sounds normal.  No respiratory distress.  Abdominal: Soft. He exhibits no distension. There is no tenderness.  Musculoskeletal: Normal range of motion.  Callus formation of the plantar surface of his right foot without surrounding erythema or drainage.  Normal PT and DP pulse in right foot.  Mild erythema of the dorsum of his right foot up to the lower third of his right lower leg consistent with cellulitis  Neurological: He is alert and oriented to person, place, and time.  Skin: Skin is warm and dry.  Psychiatric: He has a normal mood and affect. Judgment normal.  Nursing note and vitals reviewed.    ED Treatments /  Results  DIAGNOSTIC STUDIES: Oxygen Saturation is 100% on RA, NL by my interpretation.    COORDINATION OF CARE: 3:48 AM-Discussed next steps with pt. Pt verbalized understanding and is agreeable with the plan.   Labs (all labs ordered are listed, but only abnormal results are displayed) Labs Reviewed  CBC WITH DIFFERENTIAL/PLATELET - Abnormal; Notable for the following:       Result Value   Hemoglobin 11.7 (*)    HCT 36.6 (*)    MCH 25.8 (*)    All other components within normal limits  COMPREHENSIVE METABOLIC PANEL - Abnormal; Notable for the following:    Glucose, Bld 206 (*)    Alkaline Phosphatase 142 (*)    All other components within normal limits     EKG  EKG Interpretation None       Radiology Dg Foot Complete Right  Result Date: 05/17/2017 CLINICAL DATA:  RIGHT plantar wound for 1 month, pain and swelling. EXAM: RIGHT FOOT COMPLETE - 3+ VIEW COMPARISON:  RIGHT foot radiograph April 05, 2017 FINDINGS: No acute fracture deformity or dislocation. No destructive bony lesions. Plantar forefoot soft tissue swelling and focal ulcer. No radiopaque foreign bodies. Moderate vascular calcifications. IMPRESSION: Persistent ulcer plantar forefoot. Moderate vascular calcifications. No acute osseous process. Electronically Signed   By: Courtnay  Bloomer M.D.   On: 05/17/2017 04:51      Procedures Procedures (including critical care time)  Medications Ordered in ED Medications  doxycycline (VIBRA-TABS) tablet 100 mg (100 mg Oral Given 05/17/17 0446)     Initial Impression / Assessment and Plan / ED Course  I have reviewed the triage vital signs and the nursing notes.  Pertinent labs & imaging results that were available during my care of the patient were reviewed by me and considered in my medical decision making (see chart for details).      Patient is overall well-appearing.  He does have cellulitis of the right lower extremity.  His vital signs are stable.  X-ray shows no signs of osteoid this time.  He will be referred back to his podiatrist for ongoing follow-up.  He understands to return to the ER for any new or worsening symptoms.   Final Clinical Impressions(s) / ED Diagnoses   Final diagnoses:  Cellulitis of right leg    New Prescriptions New Prescriptions   DOXYCYCLINE (VIBRAMYCIN) 100 MG CAPSULE    Take 1 capsule (100 mg total) by mouth 2 (two) times daily.   I personally performed the services described in this documentation, which was scribed in my presence. The recorded information has been reviewed and is accurate.        Campos, Kevin, MD 05/17/17 0559  

## 2017-05-17 NOTE — Discharge Instructions (Signed)
Please call your podiatrist for follow-up and return to the emergency department for any new or worsening symptoms.

## 2017-05-28 ENCOUNTER — Emergency Department (HOSPITAL_COMMUNITY): Payer: Medicare HMO

## 2017-05-28 ENCOUNTER — Emergency Department (HOSPITAL_COMMUNITY)
Admission: EM | Admit: 2017-05-28 | Discharge: 2017-05-28 | Disposition: A | Discharge status: Home / Self Care | Payer: Medicare HMO | Attending: Emergency Medicine | Admitting: Emergency Medicine

## 2017-05-28 ENCOUNTER — Encounter (HOSPITAL_COMMUNITY): Payer: Self-pay | Admitting: Emergency Medicine

## 2017-05-28 DIAGNOSIS — Z7984 Long term (current) use of oral hypoglycemic drugs: Secondary | ICD-10-CM | POA: Insufficient documentation

## 2017-05-28 DIAGNOSIS — K5900 Constipation, unspecified: Secondary | ICD-10-CM

## 2017-05-28 DIAGNOSIS — I2510 Atherosclerotic heart disease of native coronary artery without angina pectoris: Secondary | ICD-10-CM | POA: Diagnosis not present

## 2017-05-28 DIAGNOSIS — E785 Hyperlipidemia, unspecified: Secondary | ICD-10-CM | POA: Diagnosis not present

## 2017-05-28 DIAGNOSIS — Z7982 Long term (current) use of aspirin: Secondary | ICD-10-CM | POA: Diagnosis not present

## 2017-05-28 DIAGNOSIS — L089 Local infection of the skin and subcutaneous tissue, unspecified: Secondary | ICD-10-CM | POA: Diagnosis not present

## 2017-05-28 DIAGNOSIS — Z79899 Other long term (current) drug therapy: Secondary | ICD-10-CM | POA: Diagnosis not present

## 2017-05-28 DIAGNOSIS — R112 Nausea with vomiting, unspecified: Secondary | ICD-10-CM | POA: Diagnosis not present

## 2017-05-28 DIAGNOSIS — J449 Chronic obstructive pulmonary disease, unspecified: Secondary | ICD-10-CM | POA: Diagnosis not present

## 2017-05-28 DIAGNOSIS — E119 Type 2 diabetes mellitus without complications: Secondary | ICD-10-CM | POA: Diagnosis not present

## 2017-05-28 DIAGNOSIS — R109 Unspecified abdominal pain: Secondary | ICD-10-CM | POA: Diagnosis not present

## 2017-05-28 DIAGNOSIS — I10 Essential (primary) hypertension: Secondary | ICD-10-CM | POA: Insufficient documentation

## 2017-05-28 DIAGNOSIS — R1084 Generalized abdominal pain: Secondary | ICD-10-CM | POA: Diagnosis not present

## 2017-05-28 DIAGNOSIS — R1030 Lower abdominal pain, unspecified: Secondary | ICD-10-CM | POA: Diagnosis present

## 2017-05-28 DIAGNOSIS — R1110 Vomiting, unspecified: Secondary | ICD-10-CM | POA: Diagnosis not present

## 2017-05-28 DIAGNOSIS — Z87891 Personal history of nicotine dependence: Secondary | ICD-10-CM | POA: Diagnosis not present

## 2017-05-28 DIAGNOSIS — K5641 Fecal impaction: Secondary | ICD-10-CM | POA: Insufficient documentation

## 2017-05-28 DIAGNOSIS — S022XXA Fracture of nasal bones, initial encounter for closed fracture: Secondary | ICD-10-CM | POA: Diagnosis not present

## 2017-05-28 LAB — URINALYSIS, ROUTINE W REFLEX MICROSCOPIC
Bacteria, UA: NONE SEEN
Bilirubin Urine: NEGATIVE
HGB URINE DIPSTICK: NEGATIVE
KETONES UR: NEGATIVE mg/dL
LEUKOCYTES UA: NEGATIVE
NITRITE: NEGATIVE
PH: 5 (ref 5.0–8.0)
Protein, ur: NEGATIVE mg/dL
RBC / HPF: NONE SEEN RBC/hpf (ref 0–5)
Specific Gravity, Urine: 1.029 (ref 1.005–1.030)

## 2017-05-28 LAB — LIPASE, BLOOD: Lipase: 27 U/L (ref 11–51)

## 2017-05-28 LAB — CBC WITH DIFFERENTIAL/PLATELET
BASOS ABS: 0 10*3/uL (ref 0.0–0.1)
BASOS PCT: 0 %
EOS PCT: 6 %
Eosinophils Absolute: 0.4 10*3/uL (ref 0.0–0.7)
HCT: 37.8 % — ABNORMAL LOW (ref 39.0–52.0)
Hemoglobin: 12.1 g/dL — ABNORMAL LOW (ref 13.0–17.0)
Lymphocytes Relative: 9 %
Lymphs Abs: 0.6 10*3/uL — ABNORMAL LOW (ref 0.7–4.0)
MCH: 25.9 pg — ABNORMAL LOW (ref 26.0–34.0)
MCHC: 32 g/dL (ref 30.0–36.0)
MCV: 80.8 fL (ref 78.0–100.0)
MONO ABS: 0.7 10*3/uL (ref 0.1–1.0)
Monocytes Relative: 10 %
NEUTROS ABS: 5.2 10*3/uL (ref 1.7–7.7)
Neutrophils Relative %: 75 %
PLATELETS: 248 10*3/uL (ref 150–400)
RBC: 4.68 MIL/uL (ref 4.22–5.81)
RDW: 14.1 % (ref 11.5–15.5)
WBC: 6.9 10*3/uL (ref 4.0–10.5)

## 2017-05-28 LAB — COMPREHENSIVE METABOLIC PANEL
ALBUMIN: 3.7 g/dL (ref 3.5–5.0)
ALT: 36 U/L (ref 17–63)
AST: 26 U/L (ref 15–41)
Alkaline Phosphatase: 122 U/L (ref 38–126)
Anion gap: 8 (ref 5–15)
BUN: 17 mg/dL (ref 6–20)
CHLORIDE: 105 mmol/L (ref 101–111)
CO2: 24 mmol/L (ref 22–32)
Calcium: 9.2 mg/dL (ref 8.9–10.3)
Creatinine, Ser: 0.99 mg/dL (ref 0.61–1.24)
GFR calc Af Amer: >60 mL/min (ref >60)
GFR calc non Af Amer: >60 mL/min (ref >60)
GLUCOSE: 165 mg/dL — AB (ref 65–99)
POTASSIUM: 4.8 mmol/L (ref 3.5–5.1)
Sodium: 137 mmol/L (ref 135–145)
Total Bilirubin: 0.6 mg/dL (ref 0.3–1.2)
Total Protein: 7.2 g/dL (ref 6.5–8.1)

## 2017-05-28 LAB — PROTIME-INR
INR: 1.07
Prothrombin Time: 14 seconds (ref 11.4–15.2)

## 2017-05-28 LAB — I-STAT CG4 LACTIC ACID, ED: Lactic Acid, Venous: 0.98 mmol/L (ref 0.5–1.9)

## 2017-05-28 MED ORDER — POLYETHYLENE GLYCOL 3350 17 G PO PACK: 17.0000 g | PACK | Freq: Every day | ORAL | 0 refills | Status: DC

## 2017-05-28 MED ORDER — SODIUM CHLORIDE 0.9 % IV BOLUS (SEPSIS)
1000.0000 mL | Freq: Once | INTRAVENOUS | Status: AC
  Administered 2017-05-28: 1000 mL via INTRAVENOUS

## 2017-05-28 MED ORDER — FLEET ENEMA 7-19 GM/118ML RE ENEM: 1.0000 | ENEMA | Freq: Once | RECTAL | Status: DC

## 2017-05-28 MED ORDER — ONDANSETRON HCL 4 MG/2ML IJ SOLN
4.0000 mg | Freq: Once | INTRAMUSCULAR | Status: AC
  Administered 2017-05-28: 4 mg via INTRAVENOUS
  Filled 2017-05-28: qty 2

## 2017-05-28 MED ORDER — IPRATROPIUM-ALBUTEROL 0.5-2.5 (3) MG/3ML IN SOLN
3.0000 mL | Freq: Once | RESPIRATORY_TRACT | Status: DC
  Filled 2017-05-28: qty 3

## 2017-05-28 MED ORDER — IOPAMIDOL (ISOVUE-300) INJECTION 61%
INTRAVENOUS | Status: AC
  Administered 2017-05-28: 100 mL via INTRAVENOUS
  Filled 2017-05-28: qty 100

## 2017-05-28 MED ORDER — ONDANSETRON HCL 4 MG PO TABS: 4.0000 mg | ORAL_TABLET | Freq: Three times a day (TID) | ORAL | 0 refills | Status: DC | PRN

## 2017-05-28 MED ORDER — MORPHINE SULFATE (PF) 4 MG/ML IV SOLN
4.0000 mg | Freq: Once | INTRAVENOUS | Status: AC
  Administered 2017-05-28: 4 mg via INTRAVENOUS
  Filled 2017-05-28: qty 1

## 2017-05-28 MED ORDER — HYDROMORPHONE HCL 1 MG/ML IJ SOLN
1.0000 mg | Freq: Once | INTRAMUSCULAR | Status: AC
  Administered 2017-05-28: 1 mg via INTRAVENOUS
  Filled 2017-05-28: qty 1

## 2017-05-28 NOTE — ED Notes (Signed)
Patient transported to CT 

## 2017-05-28 NOTE — Discharge Instructions (Signed)
Please use the MiraLAX to help with her constipation. Please stay hydrated. Please use the nausea medicine to help with vomiting to make sure he is dehydrated. Please follow-up with your primary care physician for further management. Please watch for signs and symptoms of infection. If you begin having any new or worsened symptoms, please return to the nearest emergency department.

## 2017-05-28 NOTE — ED Provider Notes (Signed)
[facility] DEPT Provider Note   CSN: [id number] Arrival date & time: 05/28/17  4196     History   Chief Complaint Chief Complaint  Patient presents with  . Emesis    HPI Richard [name] is a 56 y.o. male.  The history is provided by the patient.  Abdominal Pain   This is a new problem. The current episode started more than 2 days ago. The problem occurs constantly. The problem has been gradually worsening. The pain is associated with an unknown factor. The pain is located in the LLQ, RLQ and suprapubic region. The quality of the pain is aching, cramping and pressure-like. The pain is at a severity of 10/10. The pain is severe. Associated symptoms include fever, nausea, vomiting and constipation. Pertinent negatives include diarrhea, flatus (decreased), dysuria, frequency, hematuria, headaches and myalgias. The symptoms are aggravated by palpation. Nothing relieves the symptoms.    Past Medical History:  Diagnosis Date  . Asthma   . Clostridium difficile colitis 09/09/2014  . Diabetes mellitus without complication ([place])    diagnosed in 2013  . Essential hypertension   . Glaucoma    bilateral, surgery on left eye but not right  . Hyperlipidemia   . Neuromuscular disorder (HCC)    neuropathy in feet  . 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 Substance abuse    crack - recovered x 30 yrs    Patient Active Problem List   Diagnosis Date Noted  . Erectile dysfunction 03/06/2017  . Abdominal aortic atherosclerosis ([place]) 01/18/2017  . URI (upper respiratory infection) 10/18/2016  . Vitamin D deficiency 07/02/2016  . Tubular adenoma of colon 02/06/2016  . Diabetic neuropathy, painful ([place]) 11/30/2015  . Overweight (BMI 25.0-29.9) 11/30/2015  . CAD in native artery 10/20/2015  . Tobacco abuse   . Essential hypertension   . 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 APPENDECTOMY    . CARDIAC CATHETERIZATION N/A 09/26/2015   Procedure: Left Heart Cath and Coronary Angiography;  Surgeon: [name], MD;  Location: [facility];  Service: Cardiovascular;  Laterality: N/A;  . left eye surgery Left    glaucoma  . SKIN GRAFT         Home Medications    Prior to Admission medications   Medication Sig Start Date End Date Taking? Authorizing Provider  ACCU-CHEK FASTCLIX LANCETS MISC Check blood sugar two times a day 05/09/17   [name], DO  albuterol (PROVENTIL HFA;VENTOLIN HFA) 108 (90 Base) MCG/ACT inhaler Inhale 2 puffs into the lungs every 4 (four) hours as needed for wheezing or shortness of breath. 03/06/17   Ophelia Shoulder, MD  aspirin EC 81 MG EC tablet Take 1 tablet (81 mg total) by mouth daily. 09/26/15   Burns, Arloa Koh, MD  atorvastatin (LIPITOR) 40 MG tablet Take 1 tablet (40 mg total) by mouth daily. 04/25/17 04/25/18  [name], DO  Blood Glucose Monitoring Suppl (ACCU-CHEK GUIDE) w/Device KIT 1 each by Does not apply route 2 (two) times daily. 11/21/16   [name], DO  canagliflozin (INVOKANA) 300 MG TABS tablet Take 1 tablet (300 mg total) by mouth daily before breakfast. 10/18/16   [name], DO  doxycycline (VIBRAMYCIN) 100 MG capsule Take 1 capsule (100 mg total) by mouth 2 (two) times daily. 05/17/17   Jola Schmidt,  MD  fluticasone furoate-vilanterol (BREO ELLIPTA) 100-25 MCG/INH AEPB Inhale 1 puff into the lungs daily. 05/02/17   [name], DO  glipiZIDE (GLUCOTROL) 10 MG tablet Take 1 tablet (10 mg total) by mouth 2 (two) times daily. 04/25/17   [name], DO  glucose blood (ACCU-CHEK GUIDE) test strip Check blood sugar two times a day 05/09/17   [name], DO  latanoprost (XALATAN) 0.005 % ophthalmic solution INSTILL 1 DROP INTO BOTH EYES IN THE EVENING 10/21/15   [provider]  lisinopril (PRINIVIL,ZESTRIL) 10 MG tablet Take 10 mg every  night Patient taking differently: Take 10 mg by mouth daily.  10/20/15   Erlene Quan, PA-C  metFORMIN (GLUCOPHAGE) 1000 MG tablet Take 1 tablet (1,000 mg total) by mouth 2 (two) times daily with a meal. 04/25/17   [name], DO  pregabalin (LYRICA) 100 MG capsule Take 1 capsule (100 mg total) by mouth 3 (three) times daily. 03/07/17   [name], DO  sildenafil (VIAGRA) 100 MG tablet Take 1 tablet (100 mg total) by mouth as needed for erectile dysfunction. 04/18/17 04/18/18  [name], DO  varenicline (CHANTIX CONTINUING MONTH PAK) 1 MG tablet Take 1 tablet (1 mg total) by mouth 2 (two) times daily. 04/18/17   [name], DO    Family History Family History  Problem Relation Age of Onset  . Hypertension Mother   . Colon cancer Neg Hx   . Colon polyps Neg Hx   . Esophageal cancer Neg Hx   . Rectal cancer Neg Hx   . Stomach cancer Neg Hx     Social History Social History  Substance Use Topics  . Smoking status: Former Smoker    Packs/day: 1.00    Types: Cigarettes    Quit date: 08/13/2016  . Smokeless tobacco: Former Systems developer    Quit date: 08/24/2016     Comment: 5-6 CIAGARETTES A DAY  . Alcohol use No     Allergies   Patient has no known allergies.   Review of Systems Review of Systems  Constitutional: Positive for chills and fever. Negative for diaphoresis and fatigue.  HENT: Negative for congestion and rhinorrhea.   Respiratory: Negative for chest tightness, wheezing and stridor.   Cardiovascular: Negative for chest pain.  Gastrointestinal: Positive for abdominal pain, constipation, nausea, rectal pain and vomiting. Negative for diarrhea and flatus (decreased).  Genitourinary: Negative for dysuria, flank pain, frequency and hematuria.  Musculoskeletal: Negative for back pain, myalgias and neck stiffness.  Skin: Negative for rash and wound.  Neurological: Negative for headaches.  Psychiatric/Behavioral: Negative for agitation.  All other systems reviewed  and are negative.    Physical Exam Updated Vital Signs BP 100/60 (BP Location: Left Arm)   Pulse (!) 108   Temp 97.9 F (36.6 C) (Oral)   Resp 16   SpO2 100%   Physical Exam  Constitutional: He is oriented to person, place, and time. He appears well-developed and well-nourished. No distress.  HENT:  Head: Normocephalic.  Mouth/Throat: Oropharynx is clear and moist. No oropharyngeal exudate.  Eyes: Pupils are equal, round, and reactive to light. Conjunctivae and EOM are normal.  Neck: Normal range of motion.  Cardiovascular: Normal rate and intact distal pulses.   No murmur heard. Pulmonary/Chest: Effort normal. No stridor. No respiratory distress. He has no wheezes. He exhibits no tenderness.  Abdominal: There is tenderness.  Genitourinary: Rectal exam shows tenderness. Rectal exam shows no external hemorrhoid.  Genitourinary Comments:  Fecal impaction present. No erythema or evidence of abscess.   Musculoskeletal: He exhibits no tenderness.  Neurological: He is alert and oriented to person, place, and time. He exhibits normal muscle tone.  Skin: Capillary refill takes less than 2 seconds. He is not diaphoretic. No erythema.  Psychiatric: He has a normal mood and affect.  Nursing note and vitals reviewed.    ED Treatments / Results  Labs (all labs ordered are listed, but only abnormal results are displayed) Labs Reviewed  CBC WITH DIFFERENTIAL/PLATELET - Abnormal; Notable for the following:       Result Value   Hemoglobin 12.1 (*)    HCT 37.8 (*)    MCH 25.9 (*)    Lymphs Abs 0.6 (*)    All other components within normal limits  COMPREHENSIVE METABOLIC PANEL - Abnormal; Notable for the following:    Glucose, Bld 165 (*)    All other components within normal limits  URINALYSIS, ROUTINE W REFLEX MICROSCOPIC - Abnormal; Notable for the following:    Glucose, UA >=500 (*)    Squamous Epithelial / LPF 0-5 (*)    All other components within normal limits  URINE CULTURE   LIPASE, BLOOD  PROTIME-INR  I-STAT CG4 LACTIC ACID, ED  I-STAT CG4 LACTIC ACID, ED    EKG  EKG Interpretation None       Radiology Dg Chest 2 View  Result Date: 05/28/2017 CLINICAL DATA:  Foot infection, hyperventilation, some abdominal pain EXAM: CHEST  2 VIEW COMPARISON:  Chest x-ray of 02/26/2017 FINDINGS: There are somewhat prominent interstitial markings better seen on the lateral view anteriorly which may represent chronic lung disease and fibrosis. No definite pneumonia or effusion is currently seen. Mediastinal and hilar contours are unremarkable. The heart is within normal limits in size. No bony abnormality is seen. IMPRESSION: Suspect fibrotic change. Consider CT of the chest to assess further if warranted. No pneumonia or effusion. Electronically Signed   By: Ivar Drape M.D.   On: 05/28/2017 09:54   Ct Abdomen Pelvis W Contrast  Result Date: 05/28/2017 CLINICAL DATA:  Periumbilical abdominal pain, abdominal distention, nausea and vomiting for several days. Prior appendectomy. EXAM: CT ABDOMEN AND PELVIS WITH CONTRAST TECHNIQUE: Multidetector CT imaging of the abdomen and pelvis was performed using the standard protocol following bolus administration of intravenous contrast. CONTRAST:  100 cc ISOVUE-300 IOPAMIDOL (ISOVUE-300) INJECTION 61% COMPARISON:  09/06/2014 CT abdomen/ pelvis. FINDINGS: Lower chest: No significant pulmonary nodules or acute consolidative airspace disease. Hepatobiliary: Normal liver with no liver mass. Normal gallbladder with no radiopaque cholelithiasis. No biliary ductal dilatation. Pancreas: Normal, with no mass or duct dilation. Spleen: Normal size. No mass. Adrenals/Urinary Tract: Normal adrenals. Subcentimeter hypodense renal cortical lesion in the anterior interpolar left kidney, too small to characterize, stable, considered benign. Otherwise normal kidneys, with no hydronephrosis. Mildly distended and otherwise normal bladder. Stomach/Bowel: Grossly  normal stomach. Normal caliber small bowel with no small bowel wall thickening. Appendectomy. There is moderate rectal distention by stool up to 7.4 cm diameter with borderline mild rectal wall thickening and associated perirectal fat stranding and ill-defined fluid in the presacral space. There is scattered fat stranding tracking into the lower para-aortic retroperitoneum. Moderate stool throughout the colon. No additional sites of large bowel wall thickening or pericolonic fat stranding. Vascular/Lymphatic: Atherosclerotic nonaneurysmal abdominal aorta. Patent portal, splenic, hepatic and renal veins. No pathologically enlarged lymph nodes in the abdomen or pelvis. Reproductive: Normal size prostate. Other: No pneumoperitoneum, ascites or focal fluid collection. Musculoskeletal: No  aggressive appearing focal osseous lesions. Moderate thoracolumbar spondylosis. IMPRESSION: 1. Spectrum of findings worrisome for stercoral colitis, including moderate rectal fecal impaction, borderline rectal wall thickening and prominent inflammatory fat stranding and ill-defined fluid in the perirectal, presacral and lower retroperitoneal space . 2. No abscess or free air. 3. Moderate colonic stool volume, suggesting constipation. No evidence of bowel obstruction . 4.  Aortic Atherosclerosis (ICD10-I70.0). Electronically Signed   By: Ilona Sorrel M.D.   On: 05/28/2017 12:03   Dg Foot Complete Right  Result Date: 05/28/2017 CLINICAL DATA:  Foot infection at base of toes. EXAM: RIGHT FOOT COMPLETE - 3+ VIEW COMPARISON:  05/17/2017 FINDINGS: No acute bony abnormality. Specifically, no fracture, subluxation, or dislocation. Soft tissues are intact. No radiographic changes of osteomyelitis. IMPRESSION: No acute bony abnormality. Electronically Signed   By: Rolm Baptise M.D.   On: 05/28/2017 09:58    Procedures Fecal disimpaction Date/Time: 05/28/2017 7:23 PM Performed by: Courtney Paris Authorized by: Courtney Paris  Consent: Verbal consent obtained. Risks and benefits: risks, benefits and alternatives were discussed Consent given by: patient Patient identity confirmed: verbally with patient Time out: Immediately prior to procedure a "time out" was called to verify the correct patient, procedure, equipment, support staff and site/side marked as required. Local anesthesia used: no  Anesthesia: Local anesthesia used: no  Sedation: Patient sedated: no Patient tolerance: Patient tolerated the procedure well with no immediate complications    (including critical care time)  Medications Ordered in ED Medications  ipratropium-albuterol (DUONEB) 0.5-2.5 (3) MG/3ML nebulizer solution 3 mL (3 mLs Nebulization Refused 05/28/17 0956)  sodium phosphate (FLEET) 7-19 GM/118ML enema 1 enema (1 enema Rectal Not Given 05/28/17 1607)  sodium chloride 0.9 % bolus 1,000 mL (0 mLs Intravenous Stopped 05/28/17 1317)  ondansetron (ZOFRAN) injection 4 mg (4 mg Intravenous Given 05/28/17 0956)  morphine 4 MG/ML injection 4 mg (4 mg Intravenous Given 05/28/17 0956)  iopamidol (ISOVUE-300) 61 % injection (100 mLs Intravenous Contrast Given 05/28/17 1140)  HYDROmorphone (DILAUDID) injection 1 mg (1 mg Intravenous Given 05/28/17 1416)     Initial Impression / Assessment and Plan / ED Course  I have reviewed the triage vital signs and the nursing notes.  Pertinent labs & imaging results that were available during my care of the patient were reviewed by me and considered in my medical decision making (see chart for details).     Richard [name] is a 56 y.o. male  with a past medical history significant for asthma, COPD, CAD, diabetes, hypertension, recent foot infection, and prior C. difficile who presents with several days of fevers, abdominal pain/distention, nausea, vomiting, intolerance of oral intake, diarrhea and subsequent constipation. Patient says that history he had a temperature of 103 orally. He says  that he has had no oral intake in the last 3 days due to his severe nausea and vomiting. He says he has had abdominal pain and cramping across his lower abdomen. He does have a history of an appendectomy. He says that he had some diarrhea initially but has had constipation today. No rectal bleeding. No hematemesis. He says that his movement has felt distended and growing. He says that he has had decreased flatus. He denies any history of obstruction. He denies any groin pain. He says that he has had decrease in urination but denies dysuria. He denies any change in his foot pain that he chronically has with his diabetic neuropathies. He also recently completed a course of antibiotics for a right foot  infection. He still thinks that there is some drainage and foul smell coming from the wound. He completed doxycycline 3 days ago. Patient denies any rhinorrhea, congestion, cough, or chest pain. He does report some mild wheezing.  On exam, patient does have wheezing in all lung fields. Chest is nontender. No CVA tenderness. Abdomen is diffusely tender and distended. Patient has audible bowel sounds. Leg exam showed bilateral DP pulses. Patient has symmetric decreased sensation in his feet. Patient has a ulcer on the bottom of his right foot with no significant drainage or erythema. No tenderness. Patient had no focal neurologic deficits.  Given patient's history of foot infection and his report of continued drainage and foul smell, visual x-ray to look for osteoarthritis. Patient will have screening laboratory testing given his recent fever and his soft blood pressures today. Patient's pressure was in the low 100s and 90s on arrival. Patient was given fluids as he feels dehydrated and has not been taking oral intake in the setting of recent diarrhea. Given the prior abdominal surgery and his report of distention and decreased flatus, patient will have imaging to look for obstruction or other intra-abdominal pathology  such as diverticulitis. Patient will be given pain medicine, nausea medicine, and fluids during workup. Anticipate reassessment after workup.  Diagnostic testing showed evidence of severe fecal impaction with possible developing stercoral colitis. No evidence of obstruction or perforation. X-ray of the foot showed no evidence of osteomyelitis. Chest x-ray showed fibrotic changes with no pneumonia or effusion.  Due to fecal impaction, decision made to try manual disimpaction. Patient given pain medicine and his impaction was attempted. Patient had large amount of stool removed. Patient then had a large bowel movement with near complete resolution of his abdominal pain and symptoms. Next  Patient will be given pressure for MiraLAX and nausea medicine to stay hydrated and to help with his constipation. Patient will follow-up with his PCP for further management of the foot and the fibrous changes on the lungs. Patient understood return precautions for any signs or symptoms of infection. Patient had no other questions or concerns and will be discharged.    Final Clinical Impressions(s) / ED Diagnoses   Final diagnoses:  Fecal impaction in rectum [facility])  Generalized abdominal pain  Constipation, unspecified constipation type    New Prescriptions Discharge Medication List as of 05/28/2017  4:06 PM    START taking these medications   Details  ondansetron (ZOFRAN) 4 MG tablet Take 1 tablet (4 mg total) by mouth every 8 (eight) hours as needed for nausea or vomiting., Starting Tue 05/28/2017, Print    polyethylene glycol (MIRALAX) packet Take 17 g by mouth daily., Starting Tue 05/28/2017, Print        Clinical Impression: 1. Fecal impaction in rectum (HCC)   2. Generalized abdominal pain   3. Constipation, unspecified constipation type     Disposition: Discharge  Condition: Good  I have discussed the results, Dx and Tx plan with the pt(& family if present). He/she/they expressed  understanding and agree(s) with the plan. Discharge instructions discussed at great length. Strict return precautions discussed and pt &/or family have verbalized understanding of the instructions. No further questions at time of discharge.    Discharge Medication List as of 05/28/2017  4:06 PM    START taking these medications   Details  ondansetron (ZOFRAN) 4 MG tablet Take 1 tablet (4 mg total) by mouth every 8 (eight) hours as needed for nausea or vomiting., Starting Tue 05/28/2017, Print  polyethylene glycol (MIRALAX) packet Take 17 g by mouth daily., Starting Tue 05/28/2017, Print        Follow Up: [name], DO [place] [id number] [phone]  Schedule an appointment as soon as possible for a visit    [place] [address] [id number] [place] [phone]  If symptoms worsen     Dantavious Snowball, Gwenyth Allegra, MD 05/28/17 [id number]

## 2017-05-28 NOTE — ED Triage Notes (Signed)
Pt here with N/V being treated recently for foot infection; pt hyperventilating and c/o abd pain

## 2017-05-29 LAB — URINE CULTURE

## 2017-06-13 ENCOUNTER — Other Ambulatory Visit: Payer: Self-pay | Admitting: *Deleted

## 2017-06-13 NOTE — Telephone Encounter (Signed)
Received refill request from [facility] for Richard [name].  Upon further review of pt's chart-he is no longer using [facility] and now uses [facility].  Information was confirmed with patient and [facility] was removed from pt's profile.  Will make [facility] aware.  No further action needed, phone call complete.Despina Hidden Cassady9/6/20183:21 PM

## 2017-06-19 ENCOUNTER — Encounter: Payer: Self-pay | Admitting: Podiatry

## 2017-06-19 ENCOUNTER — Ambulatory Visit (INDEPENDENT_AMBULATORY_CARE_PROVIDER_SITE_OTHER): Payer: Medicare Other | Admitting: Podiatry

## 2017-06-19 VITALS — BP 151/97 | HR 78

## 2017-06-19 DIAGNOSIS — E11621 Type 2 diabetes mellitus with foot ulcer: Secondary | ICD-10-CM

## 2017-06-19 DIAGNOSIS — L97511 Non-pressure chronic ulcer of other part of right foot limited to breakdown of skin: Secondary | ICD-10-CM

## 2017-06-19 DIAGNOSIS — L97509 Non-pressure chronic ulcer of other part of unspecified foot with unspecified severity: Secondary | ICD-10-CM

## 2017-06-19 NOTE — Progress Notes (Signed)
This patient presents the office with chief complaint of a painful area on his right forefoot.  Patient was seen by myself on July 5  For 2 diabetic ulcers on his right foot.  One ulcer was noted under his third metatarsal and 1 under his right hallux.  The ulcer on his right hallux has healed uneventfully.  He states that on August 10. He proceeded to the emergency room for an evaluation of this skin lesion.  He says that he was unable to keep his appointment with me as a follow-up in July due to financial difficulties.  He went to the emergency room and he was diagnosed with a diabetic ulcer with cellulitis in his foot and ankle.  X-rays were taken and the x-rays revealed no bony pathology.  Patient was sent home and told to take antibiotics which helped to resolve his infection.  He now presents the office unable to put and bear weight on his right forefoot due to this sk in lesion under the ball of his right foot. He presents the office wearing his regular shoes for this visit.  He presents the office today for continued evaluation of this diabetic ulcer on his right forefoot.    GENERAL APPEARANCE: Alert, conversant. Appropriately groomed. No acute distress.  VASCULAR: Pedal pulses are  palpable at  [facility] and PT bilateral.  Capillary refill time is immediate to all digits,  Normal temperature gradient.  Digital hair growth is present bilateral  NEUROLOGIC: sensation is absent  to 5.07 monofilament at 5/5 sites bilateral.  Light touch is absent  bilateral, Muscle strength normal.  MUSCULOSKELETAL: acceptable muscle strength, tone and stability bilateral.  Intrinsic muscluature intact bilateral.  Rectus appearance of foot and digits noted bilateral.  NAILS  thick disfigured discolored nails with subungual debris noted 10.  No evidence of bacterial infection or drainage. DERMATOLOGIC: skin color, texture, and turgor are within normal limits.   no interdigital maceration noted.  No open lesions present.  .  No drainage noted. ULCER  . There is a 20 mm x 25 mm diabetic ulcer noted under the third metatarsal of the right foot.  This ulcer is covered with significant amount of keratotic and necrotic tissue which makes it painful to walk.  There is odor emanating from the diabetic ulcer, but this is due to the necrotic skin.  There is no evidence of any redness, swelling or infection noted.  The above picture is a picture of the debridement of the base of the necrotic skin ulcer.    Diabetic ulcer right forefoot  ROV  debridement of necrotic tissue at the site of diabetic ulcer.  No evidence of any pus or drainage or swelling or redness noted.  Ulcer was bandaged with Neosporin and dry sterile dressing  . Discussed the cause of the diabetic ulcer and explained he needs to our office. Weight-bear.  He says he has both a OrthoWedge shoe and a Dispensing optician at home.  I told him that he needs to faithfully wear them and treatment return to the office in 2-3 weeks for further evaluation and treatment.   Gardiner Barefoot DPM.

## 2017-06-25 ENCOUNTER — Other Ambulatory Visit: Payer: Self-pay | Admitting: *Deleted

## 2017-06-25 DIAGNOSIS — J449 Chronic obstructive pulmonary disease, unspecified: Secondary | ICD-10-CM

## 2017-06-26 MED ORDER — ALBUTEROL SULFATE HFA 108 (90 BASE) MCG/ACT IN AERS: 2.0000 | INHALATION_SPRAY | RESPIRATORY_TRACT | 5 refills | Status: DC | PRN

## 2017-06-26 NOTE — Progress Notes (Deleted)
[facility] Subjective:  HPI: [name] is a 56 y.o. male who presents for ***  Please see Assessment and Plan below for the status of his chronic medical problems.  Review of Systems: *** Objective:  Physical Exam: There were no vitals filed for this visit. *** Assessment & Plan:  No problem-specific Assessment & Plan notes found for this encounter.   Medications Ordered No orders of the defined types were placed in this encounter.  Other Orders No orders of the defined types were placed in this encounter.  Follow Up: No Follow-up on file.

## 2017-06-27 ENCOUNTER — Encounter: Payer: Medicare HMO | Admitting: Dietician

## 2017-06-27 ENCOUNTER — Ambulatory Visit: Payer: Medicare HMO | Admitting: Internal Medicine

## 2017-06-27 ENCOUNTER — Encounter: Payer: Self-pay | Admitting: Internal Medicine

## 2017-07-05 ENCOUNTER — Encounter: Payer: Self-pay | Admitting: Podiatry

## 2017-07-05 ENCOUNTER — Ambulatory Visit (INDEPENDENT_AMBULATORY_CARE_PROVIDER_SITE_OTHER): Payer: Medicare Other | Admitting: Podiatry

## 2017-07-05 VITALS — BP 174/104 | HR 78 | Temp 97.6°F

## 2017-07-05 DIAGNOSIS — L97511 Non-pressure chronic ulcer of other part of right foot limited to breakdown of skin: Secondary | ICD-10-CM | POA: Diagnosis not present

## 2017-07-05 DIAGNOSIS — L97509 Non-pressure chronic ulcer of other part of unspecified foot with unspecified severity: Secondary | ICD-10-CM

## 2017-07-05 DIAGNOSIS — E11621 Type 2 diabetes mellitus with foot ulcer: Secondary | ICD-10-CM

## 2017-07-05 DIAGNOSIS — B351 Tinea unguium: Secondary | ICD-10-CM

## 2017-07-05 DIAGNOSIS — M79676 Pain in unspecified toe(s): Secondary | ICD-10-CM

## 2017-07-05 NOTE — Progress Notes (Signed)
This patient presents the office for continued evaluation of a diabetic ulcer on his right forefoot.  He was diagnosed with a diabetic ulcer on his right forefoot and bandaged with Neosporin and a dry sterile dressing.  He was instructed to perform home soaking instructions.  He also is to wear his Cam Walker and/or OrthoWedge shoe.  He presents the office today feeling that his foot has improved.  Patient says he has been soaking and bandaging his foot is prescribed.  He presents the office today for continued evaluation and treatment of this diabetic.     General Appearance  Alert, conversant and in no acute stress.  Vascular  Dorsalis pedis and posterior pulses are palpable  bilaterally.  Capillary return is within normal limits  Bilaterally. Temperature is within normal limits  Bilaterally  Neurologic  Senn-Weinstein monofilament wire test absent   bilaterally. Muscle power  Within normal limits bilaterally.  Nails Thick disfigured discolored nails with subungual debride bilaterally from hallux to fifth toes bilaterally. No evidence of bacterial infection or drainage bilaterally.  Orthopedic  No limitations of motion of motion feet bilaterally.  No crepitus or effusions noted.  No bony pathology or digital deformities noted.  Skin  normotropic skin with no porokeratosis noted bilaterally.  Patient has a diabetic ulcer noted under the third metatarsal of the right foot.  The ulcer has keratotic tissue around the center of the ulcer itself.  This necrotic tissue is much reduced.  No evidence of any drainage, redness or swelling noted from the diabetic ulcer itself.  The center of the ulcer measures 5 mm x 5 mm.     Onychomycosis  X 10  Diabetic ulcer right foot.   ROV  debridement of necrotic tissue surrounding the diabetic ulcer.  Neosporin pressure dressing was applied.  Patient was told to continue to walk with the Cam Walker and/or the OrthoWedge shoe.  He is to return the office in 2 weeks  for further evaluation of his diabetic ulcer.  Debridement and grinding of long thick painful nails 10 was performed   Gardiner Barefoot DPM

## 2017-07-17 ENCOUNTER — Ambulatory Visit (INDEPENDENT_AMBULATORY_CARE_PROVIDER_SITE_OTHER): Payer: Medicare Other | Admitting: Podiatry

## 2017-07-17 ENCOUNTER — Encounter: Payer: Self-pay | Admitting: Podiatry

## 2017-07-17 VITALS — BP 137/86 | HR 75

## 2017-07-17 DIAGNOSIS — L97511 Non-pressure chronic ulcer of other part of right foot limited to breakdown of skin: Secondary | ICD-10-CM

## 2017-07-17 DIAGNOSIS — L97509 Non-pressure chronic ulcer of other part of unspecified foot with unspecified severity: Secondary | ICD-10-CM

## 2017-07-17 DIAGNOSIS — E11621 Type 2 diabetes mellitus with foot ulcer: Secondary | ICD-10-CM

## 2017-07-17 NOTE — Progress Notes (Signed)
This patient presents the office for continued evaluation of a diabetic ulcer on his right forefoot.  He was diagnosed with a diabetic ulcer on his right forefoot and bandaged with Neosporin and a dry sterile dressing.  He was instructed to perform home soaking instructions.  He also is to wear his Cam Walker and/or OrthoWedge shoe.  He presents the office today feeling that his foot has improved.  Patient says he has been soaking and bandaging his foot is prescribed.  He presents the office today for continued evaluation and treatment of this diabetic.     General Appearance  Alert, conversant and in no acute stress.  Vascular  Dorsalis pedis and posterior pulses are palpable  bilaterally.  Capillary return is within normal limits  Bilaterally. Temperature is within normal limits  Bilaterally  Neurologic  Senn-Weinstein monofilament wire test absent   bilaterally. Muscle power  Within normal limits bilaterally.  Nails Thick disfigured discolored nails with subungual debride bilaterally from hallux to fifth toes bilaterally. No evidence of bacterial infection or drainage bilaterally.  Orthopedic  No limitations of motion of motion feet bilaterally.  No crepitus or effusions noted.  No bony pathology or digital deformities noted.  Skin  normotropic skin with no porokeratosis noted bilaterally.  Patient has a diabetic ulcer noted under the third metatarsal of the right foot.  The ulcer has keratotic tissue around the center of the ulcer itself.  This necrotic tissue is much reduced.  No evidence of any drainage, redness or swelling noted from the diabetic ulcer itself.  The center of the ulcer measures 3 mm. X 5 mm.     \  Diabetic ulcer right foot.   ROV  debridement of necrotic tissue surrounding the diabetic ulcer.  Neosporin pressure dressing was applied.  Patient was told to continue to walk with the Cam Walker and/or the OrthoWedge shoe.  Padding was applied to the orthowedge shoe right foot.  Continue home soaks.  RTC 2 weeks.   Gardiner Barefoot DPM

## 2017-08-09 ENCOUNTER — Encounter: Payer: Self-pay | Admitting: Podiatry

## 2017-08-09 ENCOUNTER — Ambulatory Visit (INDEPENDENT_AMBULATORY_CARE_PROVIDER_SITE_OTHER): Payer: Medicare Other | Admitting: Podiatry

## 2017-08-09 VITALS — BP 144/99 | HR 81

## 2017-08-09 DIAGNOSIS — L84 Corns and callosities: Secondary | ICD-10-CM | POA: Diagnosis not present

## 2017-08-09 DIAGNOSIS — L97511 Non-pressure chronic ulcer of other part of right foot limited to breakdown of skin: Secondary | ICD-10-CM | POA: Diagnosis not present

## 2017-08-09 DIAGNOSIS — E11621 Type 2 diabetes mellitus with foot ulcer: Secondary | ICD-10-CM | POA: Diagnosis not present

## 2017-08-09 DIAGNOSIS — L97509 Non-pressure chronic ulcer of other part of unspecified foot with unspecified severity: Secondary | ICD-10-CM

## 2017-08-09 NOTE — Progress Notes (Signed)
This patient presents the office for continued evaluation of a diabetic ulcer on his right forefoot.  He was diagnosed with a diabetic ulcer on his right forefoot and bandaged with Neosporin and a dry sterile dressing.  He was instructed to perform home soaking instructions.  He also is to wear his Cam Walker and/or OrthoWedge shoe.  He presents the office today feeling that his foot has improved.  Patient says he has been soaking and bandaging his foot is prescribed.  He presents the office today for continued evaluation and treatment of this diabetic ulcer.  He has also noticed callus on the outside ball of left foot.   General Appearance  Alert, conversant and in no acute stress.  Vascular  Dorsalis pedis and posterior pulses are palpable  bilaterally.  Capillary return is within normal limits  Bilaterally. Temperature is within normal limits  Bilaterally  Neurologic  Senn-Weinstein monofilament wire test absent   bilaterally. Muscle power  Within normal limits bilaterally.  Nails Thick disfigured discolored nails with subungual debride bilaterally from hallux to fifth toes bilaterally. No evidence of bacterial infection or drainage bilaterally.  Orthopedic  No limitations of motion of motion feet bilaterally.  No crepitus or effusions noted.  No bony pathology or digital deformities noted.  Skin  normotropic skin  noted bilaterally.  Patient has a diabetic ulcer noted under the third metatarsal of the right foot.  The ulcer has keratotic tissue around the center of the ulcer itself.  This necrotic tissue is much reduced.  No evidence of any drainage, redness or swelling noted from the diabetic ulcer itself.  The center of the ulcer measures 5 mm. X 5 mm.     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    \Diabetic ulcer right foot. Porokeratosis  Left foot.   ROV  debridement of necrotic tissue surrounding the diabetic ulcer.  Neosporin pressure dressing was applied.  Patient was told to  continue to walk with the Cam Walker and/or the OrthoWedge shoe.  Padding was applied to the orthowedge shoe right foot. Continue home soaks. Debridement of porokeratosis  Left foot.   RTC 3 weeks.   Gardiner Barefoot DPM

## 2017-08-23 DIAGNOSIS — E119 Type 2 diabetes mellitus without complications: Secondary | ICD-10-CM | POA: Diagnosis not present

## 2017-09-03 ENCOUNTER — Ambulatory Visit: Payer: Medicare Other | Admitting: Internal Medicine

## 2017-09-05 ENCOUNTER — Ambulatory Visit: Payer: Medicare Other | Admitting: Internal Medicine

## 2017-09-06 ENCOUNTER — Encounter: Payer: Self-pay | Admitting: Podiatry

## 2017-09-06 ENCOUNTER — Ambulatory Visit (INDEPENDENT_AMBULATORY_CARE_PROVIDER_SITE_OTHER): Payer: Medicare Other | Admitting: Podiatry

## 2017-09-06 VITALS — BP 149/78 | HR 87

## 2017-09-06 DIAGNOSIS — L84 Corns and callosities: Secondary | ICD-10-CM | POA: Diagnosis not present

## 2017-09-06 DIAGNOSIS — B351 Tinea unguium: Secondary | ICD-10-CM | POA: Diagnosis not present

## 2017-09-06 DIAGNOSIS — L97509 Non-pressure chronic ulcer of other part of unspecified foot with unspecified severity: Secondary | ICD-10-CM

## 2017-09-06 DIAGNOSIS — M79676 Pain in unspecified toe(s): Secondary | ICD-10-CM

## 2017-09-06 DIAGNOSIS — E11621 Type 2 diabetes mellitus with foot ulcer: Secondary | ICD-10-CM

## 2017-09-06 DIAGNOSIS — L97511 Non-pressure chronic ulcer of other part of right foot limited to breakdown of skin: Secondary | ICD-10-CM

## 2017-09-06 NOTE — Progress Notes (Signed)
This patient presents the office for continued evaluation of a diabetic ulcer on his right forefoot.  He was previously  diagnosed with a diabetic ulcer on his right forefoot which was  bandaged with Neosporin and a dry sterile dressing.  He was instructed to perform home soaking instructions.  He also is to wear his Cam Walker and/or OrthoWedge shoe.  He presents the office today feeling that his foot is the same size with callus noted around the ulcer and under the outside ball of left foot.  Patient says he has been soaking and bandaging his  right foot .  He presents the office today for continued evaluation and treatment of this diabetic ulcer.  He has also noticed callus on the outside ball of left foot. He also says his nails have grown thick and long and requests treatment.   General Appearance  Alert, conversant and in no acute stress.  Vascular  Dorsalis pedis and posterior pulses are palpable  bilaterally.  Capillary return is within normal limits  Bilaterally. Temperature is within normal limits  Bilaterally  Neurologic  Senn-Weinstein monofilament wire test absent   bilaterally. Muscle power  Within normal limits bilaterally.  Nails Thick disfigured discolored nails with subungual debride bilaterally from hallux to fifth toes bilaterally. No evidence of bacterial infection or drainage bilaterally.  Orthopedic  No limitations of motion of motion feet bilaterally.  No crepitus or effusions noted.  No bony pathology or digital deformities noted.  Skin  normotropic skin  noted bilaterally.  Patient has a diabetic ulcer noted under the second metatarsal of the right foot.  The ulcer has keratotic tissue around the center of the ulcer itself.  This necrotic tissue is much reduced.  No evidence of any drainage, redness or swelling noted from the diabetic ulcer itself.  The center of the ulcer measures 5 mm. X 5 mm.     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    \Diabetic ulcer right foot.  Porokeratosis  Left foot.   ROV  debridement of necrotic tissue surrounding the diabetic ulcer.  Neosporin pressure dressing was applied.  Patient was told to continue to walk with the Cam Walker and/or the OrthoWedge shoe.  Padding was applied to the orthowedge shoe right foot. Continue home soaks. Debridement of porokeratosis  Left foot. Debridement of nails  X 10.  During my visit. He told me that the exercises and walks on for an hour a day to maintain his weight.  I told this patient that there has been minimal improvement and that we should refer him to one of the younger doctors.  . He says he would like to continue this process and decided to have him use Silvadene in an effort to close the ulcer.  . We also discussed him returning to the office within the next few weeks for an application of an [place]na boot to help promote healing of the right forefoot ulcer.  RTC 3 prn.   Gardiner Barefoot DPM

## 2017-09-09 ENCOUNTER — Telehealth: Payer: Self-pay | Admitting: Podiatry

## 2017-09-13 ENCOUNTER — Ambulatory Visit (INDEPENDENT_AMBULATORY_CARE_PROVIDER_SITE_OTHER): Payer: Medicare Other

## 2017-09-13 ENCOUNTER — Ambulatory Visit (INDEPENDENT_AMBULATORY_CARE_PROVIDER_SITE_OTHER): Payer: Medicare Other | Admitting: Podiatry

## 2017-09-13 ENCOUNTER — Encounter: Payer: Self-pay | Admitting: Podiatry

## 2017-09-13 VITALS — BP 127/71 | HR 88

## 2017-09-13 DIAGNOSIS — L97502 Non-pressure chronic ulcer of other part of unspecified foot with fat layer exposed: Secondary | ICD-10-CM

## 2017-09-13 DIAGNOSIS — I739 Peripheral vascular disease, unspecified: Secondary | ICD-10-CM

## 2017-09-13 DIAGNOSIS — M19071 Primary osteoarthritis, right ankle and foot: Secondary | ICD-10-CM | POA: Diagnosis not present

## 2017-09-13 DIAGNOSIS — M869 Osteomyelitis, unspecified: Secondary | ICD-10-CM | POA: Diagnosis not present

## 2017-09-14 LAB — CBC WITH DIFFERENTIAL/PLATELET
BASOS ABS: 70 {cells}/uL (ref 0–200)
BASOS PCT: 1.3 %
EOS ABS: 610 {cells}/uL — AB (ref 15–500)
Eosinophils Relative: 11.3 %
HCT: 42.2 % (ref 38.5–50.0)
Hemoglobin: 13.7 g/dL (ref 13.2–17.1)
Lymphs Abs: 1096 cells/uL (ref 850–3900)
MCH: 26.6 pg — AB (ref 27.0–33.0)
MCHC: 32.5 g/dL (ref 32.0–36.0)
MCV: 81.9 fL (ref 80.0–100.0)
MPV: 10 fL (ref 7.5–12.5)
Monocytes Relative: 10.3 %
NEUTROS PCT: 56.8 %
Neutro Abs: 3067 cells/uL (ref 1500–7800)
PLATELETS: 303 10*3/uL (ref 140–400)
RBC: 5.15 10*6/uL (ref 4.20–5.80)
RDW: 13.3 % (ref 11.0–15.0)
TOTAL LYMPHOCYTE: 20.3 %
WBC: 5.4 10*3/uL (ref 3.8–10.8)
WBCMIX: 556 {cells}/uL (ref 200–950)

## 2017-09-14 LAB — C-REACTIVE PROTEIN: CRP: 5.2 mg/L (ref <8.0)

## 2017-09-14 LAB — SEDIMENTATION RATE: Sed Rate: 22 mm/h — ABNORMAL HIGH (ref 0–20)

## 2017-09-17 NOTE — Progress Notes (Signed)
Subjective: Richard [name] presents to the office today for evaluation of ulceration of the right fourth toe which is been ongoing for several months he has been under the active care of Dr. Prudence Davidson for this.  He presents today for second opinion and further evaluation of the wound.  He states that he wears a surgical shoe with offloading pads intermittently but he is currently in school he does not wear this all the time he does wear a regular shoe he does quite a bit of walking.  His blood sugar runs between 87-200.  He denies any drainage or pus.  He has been using either Silvadene or Neosporin to the wound on a daily basis.  He denies any claudication symptoms.  He does have numbness to his feet.  He has no other concerns today. Denies any systemic complaints such as fevers, chills, nausea, vomiting. No acute changes since last appointment, and no other complaints at this time.   Objective: AAO x3, NAD DP/PT pulses palpable bilaterally, CRT less than 3 seconds Sensation decreased with Simms Weinstein monofilament Ulceration present to the plantar aspect of the foot submetatarsal 3 area.  There is hyperkeratotic periwound.  After debridement the wound measures 0.9 x 0.5 x 0.2cm.  There is no probing to bone, undermining or tunneling.  There is no surrounding erythema, ascending cellulitis.  There is no fluctuance or crepitus.  There is no malodor.  There is prominence of metatarsal heads plantarly with atrophy of the fat pad. No open lesions or other pre-ulcerative lesions.  No pain with calf compression, swelling, warmth, erythema  Assessment: Chronic ulceration right foot  Plan: -All treatment options discussed with the patient including all alternatives, risks, complications.  -X-rays were obtained and reviewed.  No definitive evidence of acute osteomyelitis.  No soft tissue emphysema present. -I did an ABI in the office today on the left side is 1.26 on the right was 0.96 -The wound was sharply  debrided today after verbal consent was obtained with a scalpel to healthy, granular tissue.  I ordered a collagen, silver dressing today through [facility].  -Recommend continue with surgical shoe and offloading pads at all times.  Discussed the importance of offloading to help with this wound to heal. -Follow-up as scheduled -Patient encouraged to call the office with any questions, concerns, change in symptoms.   Trula Slade DPM

## 2017-09-20 NOTE — Addendum Note (Signed)
Addended by: Cranford Mon R on: 09/20/2017 11:11 AM   Modules accepted: Orders

## 2017-09-24 ENCOUNTER — Telehealth: Payer: Self-pay | Admitting: Podiatry

## 2017-09-24 NOTE — Telephone Encounter (Signed)
I told Richard [name] - [facility] there was not exudate to the wound, please offer the discounted price for the collagen.

## 2017-09-24 NOTE — Telephone Encounter (Signed)
This is [place] with [employer]. We did receive the updated order but I just wanted to verify it appears the drainage is listed as none as the order that came back to [place] this last time is very dark so I just wanted to verify this is correct. With no drainage this collagen will not be covered. So we can offer a discounted price if you would like. We already previously shipped out all the other supplies that were requested. If the medical records show that it is low or moderate you can update that on the order and fax it [phone]. If it is not, you can call us back at [phone] and just let us know so we know if the pt qualifies for the discounted price. Also, under the measurements, there is a yes or a no box for the qualifying wound, if you will please update that on the order as well just to verify this is a qualifying wound and fax back over to [place]. Thanks so much and have a good day.

## 2017-09-25 NOTE — Telephone Encounter (Signed)
Entered by error

## 2017-09-27 ENCOUNTER — Ambulatory Visit (INDEPENDENT_AMBULATORY_CARE_PROVIDER_SITE_OTHER): Payer: Medicare Other | Admitting: Podiatry

## 2017-09-27 ENCOUNTER — Telehealth: Payer: Self-pay | Admitting: Podiatry

## 2017-09-27 DIAGNOSIS — L97502 Non-pressure chronic ulcer of other part of unspecified foot with fat layer exposed: Secondary | ICD-10-CM | POA: Diagnosis not present

## 2017-09-27 NOTE — Telephone Encounter (Signed)
Per Dr. Leigh Aurora request I called [facility] and spoke with [facility]. I asked her if Dr. Jacqualyn Posey ordered silver collagen dressing. She replied that they did get the order but because it was marked as no drainage it would not be covered. She stated that they attempted to call the pt and offer pricing but could not leave a message because no voicemail was set up. I told her thank you and that I would let Dr. Jacqualyn Posey know.

## 2017-10-03 DIAGNOSIS — H401122 Primary open-angle glaucoma, left eye, moderate stage: Secondary | ICD-10-CM | POA: Diagnosis not present

## 2017-10-03 DIAGNOSIS — H2513 Age-related nuclear cataract, bilateral: Secondary | ICD-10-CM | POA: Diagnosis not present

## 2017-10-03 DIAGNOSIS — H40001 Preglaucoma, unspecified, right eye: Secondary | ICD-10-CM | POA: Diagnosis not present

## 2017-10-03 DIAGNOSIS — H524 Presbyopia: Secondary | ICD-10-CM | POA: Diagnosis not present

## 2017-10-03 NOTE — Progress Notes (Signed)
Subjective: Richard [name] presents the office today for follow-up evaluation of a wound to the right foot.  He states he only wears the surgical shoe, offloading shoe while in the house and goes outside or he goes for anywhere outside the house he wears a regular shoe.  He states that the wound is getting somewhat better.  Denies any drainage or pus coming from the area denies any surrounding redness or drainage.  Denies any increase in swelling to his foot denies any red streaks.  He has no other concerns or changes noted today. Denies any systemic complaints such as fevers, chills, nausea, vomiting. No acute changes since last appointment, and no other complaints at this time.   Objective: AAO x3, NAD DP/PT pulses palpable bilaterally, CRT less than 3 seconds On the plantar aspect of the right foot submetatarsal 3 continue to be ulceration but today measures 0.5 x 0.5 x 0.2 cm.  Is no probing to bone, undermining or tunneling.  There is no surrounding erythema, ascending cellulitis.  There is no fluctuation or crepitation.  There is no malodor.  There is no clinical signs of infection noted. No open lesions or pre-ulcerative lesions.  No pain with calf compression, swelling, warmth, erythema  Assessment: Right foot chronic ulceration  Plan: -All treatment options discussed with the patient including all alternatives, risks, complications.  -After verbal consent was obtained the wound was sharply debrided with a #312 scalpel to healthy, granular tissue.  Silvadene was applied followed by a bandage.  I want to continue with daily dressing changes at home.  Previously did order him a collagen, silver dressing he did not receive this may receive the other gauze and dressing supplies.  We did contact the company and given that there is no drainage it was not can be covered by insurance.  Apparently the company is try to reach out to him on several occasions but unable to reach him. -Monitor for any clinical  signs or symptoms of infection and directed to call the office immediately should any occur or go to the ER. -Continue offloading at all times. -Patient encouraged to call the office with any questions, concerns, change in symptoms.    Trula Slade DPM

## 2017-10-03 NOTE — Telephone Encounter (Signed)
Val- can you please have him do silvadene if not allergic. Please let him know that [facility] has been trying to calling him.

## 2017-10-04 MED ORDER — SILVER SULFADIAZINE 1 % EX CREA: 1.0000 "application " | TOPICAL_CREAM | Freq: Every day | CUTANEOUS | 0 refills | Status: DC

## 2017-10-04 NOTE — Addendum Note (Signed)
Addended by: Harriett Sine D on: 10/04/2017 09:41 AM   Modules accepted: Orders

## 2017-10-04 NOTE — Telephone Encounter (Addendum)
I informed pt Dr. Jacqualyn Posey had ordered Silvadene Cream for the wound, due to the Silver Collagen was not covered by his insurance and that [facility] had been trying to contact him, but voicemail had not been set up. Pt states have [facility] contact by email at [email or web address]. I spoke with [facility] and he said they would continue to send the gauze and tape ordered to pt and he could contact them at their general number to discuss the silver collagen dressing. I informed pt of Dustin - [facility] main phone.

## 2017-10-09 DIAGNOSIS — L97519 Non-pressure chronic ulcer of other part of right foot with unspecified severity: Secondary | ICD-10-CM

## 2017-10-09 DIAGNOSIS — E11621 Type 2 diabetes mellitus with foot ulcer: Secondary | ICD-10-CM | POA: Insufficient documentation

## 2017-10-09 DIAGNOSIS — L97419 Non-pressure chronic ulcer of right heel and midfoot with unspecified severity: Secondary | ICD-10-CM | POA: Insufficient documentation

## 2017-10-09 NOTE — Progress Notes (Signed)
Subjective:  HPI: [name] is a 57 y.o. male who presents for follow up of diabetes and HTN.  Our last visit was about 6 months ago.  He has been dealing with a non healing right foot ulcer with podiatry since that time.  Unfortunately he missed appointments with me, he reports he was told that I was not covered by his insurance and he switched to the "minute clinic" however was recently getting frustrated with his care there and called back his insurance for a list of providers and learned that I was indeed covered by his insurance, thus he scheduled a visit.  Please see Assessment and Plan below for the status of his chronic medical problems.  Review of Systems: Review of Systems  Constitutional: Negative for chills, fever, malaise/fatigue and weight loss.  Respiratory: Negative for cough and shortness of breath.   Cardiovascular: Negative for chest pain and claudication.  Gastrointestinal: Negative for abdominal pain.  Genitourinary: Negative for dysuria and frequency.  Musculoskeletal: Positive for joint pain.  Endo/Heme/Allergies: Negative for polydipsia.  Psychiatric/Behavioral: Negative for depression.  All other systems reviewed and are negative.   Objective:  Physical Exam: Vitals:   10/10/17 0906  BP: 130/70  Pulse: 75  Temp: 98.2 F (36.8 C)  TempSrc: Oral  SpO2: 100%  Weight: 227 lb 12.8 oz (103.3 kg)  Height: 6\' 3"  (1.905 m)   Physical Exam  Constitutional: He is well-developed, well-nourished, and in no distress.  HENT:  Mouth/Throat: Oropharynx is clear and moist.  Cardiovascular: Normal rate and regular rhythm.  Pulmonary/Chest: Effort normal and breath sounds normal.  Abdominal: Soft. Bowel sounds are normal.  Musculoskeletal: He exhibits no edema.  Nursing note and vitals reviewed.  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   Last month I obtained ABI in office, left normal, right with 0.96 boarderline result, and abonrmal waveform.  He reports ulcer is currently doing well, almost healed.  A: Diabetic right foot ulcer  P: Will obtain U/S ABI for further investigate for suspected PAD of the right leg. Has follow up with podiatry tomorrow.  Essential hypertension HPI: No complaints, lisinopril listed on medication list, he reporst he has not taken in at least several months.  A: Essential HTN well controlled  P: Continue SGLT2 inhibitor- change from Invokana to Jardiance  Abdominal aortic atherosclerosis (HCC) Remains on ASA 81mg  +Atorvastatin 40mg  daily  COPD with asthma ([place]) Reports he is doing well, did have 1 ER visit for COPD exacerbation since I last saw him.  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  Agreeable to change to Jardiance 10mg  daily d/c invokana D/C Glipizide, has high ASCVD risk- start Trulicity 7.35 weekly  Diabetic neuropathy, painful (HCC) Reports Lyrica helping tremendously.  Continue lyrica 100mg  TID.  Erectile dysfunction Discussed importance of tobacco cessation and diabetic control.  He reports viagra was helpful but only at 100mg  dose.  -Refilled viagra 100mg  daily  Tobacco abuse  Chantix helped him quit smoking for several weeks- got stressed and started again.  Ready to quit again, has cut down to 1 pack weekly.  -Refilled Chantix.  Overweight (BMI  25.0-29.9) -Discussed wegiht loss, starting on GLP-1 agonist.   Medications Ordered Meds ordered this encounter  Medications  . varenicline (CHANTIX CONTINUING MONTH PAK) 1 MG tablet    Sig: Take 1 tablet (1 mg total) by mouth daily for 3 days, THEN 1 tablet (1 mg total) 2 (two) times daily for 27 days.    Dispense:  60 tablet    Refill:  1  . pregabalin (LYRICA) 100 MG capsule    Sig: Take 1 capsule (100 mg total) by mouth 3 (three) times daily.    Dispense:  90 capsule    Refill:  5  . metFORMIN (GLUCOPHAGE) 1000 MG tablet    Sig: Take 1 tablet (1,000 mg total) by mouth 2 (two) times daily with a meal.    Dispense:  180 tablet    Refill:  3  . mometasone-formoterol (DULERA) 100-5 MCG/ACT AERO    Sig: Inhale 2 puffs into the lungs 2 (two) times daily.    Dispense:  13 g    Refill:  3  . empagliflozin (JARDIANCE) 10 MG TABS tablet    Sig: Take 10 mg by mouth daily.    Dispense:  90 tablet    Refill:  3    D/C Invokana  . atorvastatin (LIPITOR) 40 MG tablet    Sig: Take 1 tablet (40 mg total) by mouth daily.    Dispense:  90 tablet    Refill:  3  . albuterol (PROVENTIL HFA;VENTOLIN HFA) 108 (90 Base) MCG/ACT inhaler    Sig: Inhale 2 puffs into the lungs every 4 (four) hours as needed for wheezing or shortness of breath.    Dispense:  6.7 g    Refill:  5  . Dulaglutide (TRULICITY) 5.91 MB/8.4YK SOPN    Sig: Inject 0.75 mg into the skin once a week.    Dispense:  8 mL    Refill:  3  . sildenafil (VIAGRA) 100 MG tablet    Sig: Take 1 tablet (100 mg total) by mouth as needed for erectile dysfunction.    Dispense:  10 tablet    Refill:  3   Other Orders Orders Placed This Encounter  Procedures  . Glucose, capillary  . POC Hbg A1C   Follow Up: Return in about 3 months (around 01/08/2018).

## 2017-10-09 NOTE — Assessment & Plan Note (Addendum)
HPI: He has been following with Podiatry Drs Jacqualyn Posey and Prudence Davidson for treatment.  Last month I obtained ABI in office, left normal, right with 0.96 boarderline result, and abonrmal waveform.  He reports ulcer is currently doing well, almost healed.  A: Diabetic right foot ulcer  P: Will obtain U/S ABI for further investigate for suspected PAD of the right leg. Has follow up with podiatry tomorrow.

## 2017-10-10 ENCOUNTER — Ambulatory Visit (INDEPENDENT_AMBULATORY_CARE_PROVIDER_SITE_OTHER): Payer: Medicare Other | Admitting: Internal Medicine

## 2017-10-10 ENCOUNTER — Telehealth: Payer: Self-pay | Admitting: Dietician

## 2017-10-10 ENCOUNTER — Encounter (INDEPENDENT_AMBULATORY_CARE_PROVIDER_SITE_OTHER): Payer: Self-pay

## 2017-10-10 ENCOUNTER — Other Ambulatory Visit: Payer: Self-pay

## 2017-10-10 ENCOUNTER — Encounter: Payer: Self-pay | Admitting: Internal Medicine

## 2017-10-10 VITALS — BP 130/70 | HR 75 | Temp 98.2°F | Ht 75.0 in | Wt 227.8 lb

## 2017-10-10 DIAGNOSIS — E1140 Type 2 diabetes mellitus with diabetic neuropathy, unspecified: Secondary | ICD-10-CM | POA: Diagnosis not present

## 2017-10-10 DIAGNOSIS — I10 Essential (primary) hypertension: Secondary | ICD-10-CM | POA: Diagnosis not present

## 2017-10-10 DIAGNOSIS — E1139 Type 2 diabetes mellitus with other diabetic ophthalmic complication: Secondary | ICD-10-CM

## 2017-10-10 DIAGNOSIS — J449 Chronic obstructive pulmonary disease, unspecified: Secondary | ICD-10-CM

## 2017-10-10 DIAGNOSIS — E1165 Type 2 diabetes mellitus with hyperglycemia: Secondary | ICD-10-CM

## 2017-10-10 DIAGNOSIS — Z7982 Long term (current) use of aspirin: Secondary | ICD-10-CM | POA: Diagnosis not present

## 2017-10-10 DIAGNOSIS — N521 Erectile dysfunction due to diseases classified elsewhere: Secondary | ICD-10-CM

## 2017-10-10 DIAGNOSIS — IMO0002 Reserved for concepts with insufficient information to code with codable children: Secondary | ICD-10-CM

## 2017-10-10 DIAGNOSIS — E663 Overweight: Secondary | ICD-10-CM

## 2017-10-10 DIAGNOSIS — F17210 Nicotine dependence, cigarettes, uncomplicated: Secondary | ICD-10-CM

## 2017-10-10 DIAGNOSIS — L97519 Non-pressure chronic ulcer of other part of right foot with unspecified severity: Secondary | ICD-10-CM

## 2017-10-10 DIAGNOSIS — I700 Atherosclerosis of aorta: Secondary | ICD-10-CM | POA: Diagnosis not present

## 2017-10-10 DIAGNOSIS — E11621 Type 2 diabetes mellitus with foot ulcer: Secondary | ICD-10-CM

## 2017-10-10 DIAGNOSIS — Z6825 Body mass index (BMI) 25.0-25.9, adult: Secondary | ICD-10-CM | POA: Diagnosis not present

## 2017-10-10 DIAGNOSIS — Z79899 Other long term (current) drug therapy: Secondary | ICD-10-CM

## 2017-10-10 DIAGNOSIS — N529 Male erectile dysfunction, unspecified: Secondary | ICD-10-CM

## 2017-10-10 DIAGNOSIS — Z7984 Long term (current) use of oral hypoglycemic drugs: Secondary | ICD-10-CM

## 2017-10-10 DIAGNOSIS — Z720 Tobacco use: Secondary | ICD-10-CM

## 2017-10-10 LAB — POCT GLYCOSYLATED HEMOGLOBIN (HGB A1C): Hemoglobin A1C: 8

## 2017-10-10 LAB — GLUCOSE, CAPILLARY: Glucose-Capillary: 216 mg/dL — ABNORMAL HIGH (ref 65–99)

## 2017-10-10 MED ORDER — METFORMIN HCL 1000 MG PO TABS: 1000.0000 mg | ORAL_TABLET | Freq: Two times a day (BID) | ORAL | 3 refills | Status: DC

## 2017-10-10 MED ORDER — SILDENAFIL CITRATE 100 MG PO TABS: 100.0000 mg | ORAL_TABLET | ORAL | 3 refills | Status: DC | PRN

## 2017-10-10 MED ORDER — DULAGLUTIDE 0.75 MG/0.5ML ~~LOC~~ SOAJ: 0.7500 mg | SUBCUTANEOUS | 3 refills | Status: DC

## 2017-10-10 MED ORDER — PREGABALIN 100 MG PO CAPS: 100.0000 mg | ORAL_CAPSULE | Freq: Three times a day (TID) | ORAL | 5 refills | Status: DC

## 2017-10-10 MED ORDER — ATORVASTATIN CALCIUM 40 MG PO TABS: 40.0000 mg | ORAL_TABLET | Freq: Every day | ORAL | 3 refills | Status: DC

## 2017-10-10 MED ORDER — ALBUTEROL SULFATE HFA 108 (90 BASE) MCG/ACT IN AERS: 2.0000 | INHALATION_SPRAY | RESPIRATORY_TRACT | 5 refills | Status: DC | PRN

## 2017-10-10 MED ORDER — VARENICLINE TARTRATE 1 MG PO TABS: ORAL_TABLET | ORAL | 1 refills | Status: AC

## 2017-10-10 MED ORDER — MOMETASONE FURO-FORMOTEROL FUM 100-5 MCG/ACT IN AERO: 2.0000 | INHALATION_SPRAY | Freq: Two times a day (BID) | RESPIRATORY_TRACT | 3 refills | Status: DC

## 2017-10-10 MED ORDER — EMPAGLIFLOZIN 10 MG PO TABS: 10.0000 mg | ORAL_TABLET | Freq: Every day | ORAL | 3 refills | Status: DC

## 2017-10-10 NOTE — Telephone Encounter (Signed)
Patient desires MNT and DSMT follow up. Appointment schedule for 10/25/17 at 9:15 am. Request referral signature.

## 2017-10-10 NOTE — Assessment & Plan Note (Signed)
Chantix helped him quit smoking for several weeks- got stressed and started again.  Ready to quit again, has cut down to 1 pack weekly.  -Refilled Chantix.

## 2017-10-10 NOTE — Patient Instructions (Signed)
I want you to stop glipizide.  You will start trulicity- 0.75mg  injection once weekly.  I want you to stop Invokana, I am replacing this with Jardiance (empagliflozin 10mg  daily)

## 2017-10-10 NOTE — Assessment & Plan Note (Signed)
Remains on ASA 81mg  +Atorvastatin 40mg  daily

## 2017-10-10 NOTE — Assessment & Plan Note (Signed)
Discussed importance of tobacco cessation and diabetic control.  He reports viagra was helpful but only at 100mg  dose.  -Refilled viagra 100mg  daily

## 2017-10-10 NOTE — Assessment & Plan Note (Signed)
HPI: No complaints, lisinopril listed on medication list, he reporst he has not taken in at least several months.  A: Essential HTN well controlled  P: Continue SGLT2 inhibitor- change from Invokana to [employer]

## 2017-10-10 NOTE — Assessment & Plan Note (Signed)
No polyuria or polydispia, overall no complaints with medications.  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  Agreeable to change to Jardiance 10mg  daily d/c invokana D/C Glipizide, has high ASCVD risk- start Trulicity 1.29 weekly

## 2017-10-10 NOTE — Assessment & Plan Note (Signed)
Reports Lyrica helping tremendously.  Continue lyrica 100mg  TID.

## 2017-10-10 NOTE — Assessment & Plan Note (Signed)
Reports he is doing well, did have 1 ER visit for COPD exacerbation since I last saw him.  A: COPD with ASTHMA P: Continue dulera 100-5 BID Albuterol PRN Discussed smoking cessation- Rx Chantix

## 2017-10-10 NOTE — Assessment & Plan Note (Signed)
-  Discussed wegiht loss, starting on GLP-1 agonist.

## 2017-10-11 ENCOUNTER — Encounter: Payer: Self-pay | Admitting: Podiatry

## 2017-10-11 ENCOUNTER — Ambulatory Visit (INDEPENDENT_AMBULATORY_CARE_PROVIDER_SITE_OTHER): Payer: Medicare Other | Admitting: Podiatry

## 2017-10-11 VITALS — Temp 98.3°F

## 2017-10-11 DIAGNOSIS — L97502 Non-pressure chronic ulcer of other part of unspecified foot with fat layer exposed: Secondary | ICD-10-CM | POA: Diagnosis not present

## 2017-10-11 NOTE — Telephone Encounter (Signed)
Completed.

## 2017-10-11 NOTE — Addendum Note (Signed)
Addended by: [name] C on: 10/11/2017 01:12 PM   Modules accepted: Orders

## 2017-10-13 NOTE — Progress Notes (Signed)
Subjective: Richard [name] presents the office today for follow-up evaluation of a wound to the right foot.  He still does not wear the surgical shoe at all times and he does admit that he wears a regular shoe.  He does have his maternal wear it more frequently but not all the time.  He also followed up with his primary care physician and they have ordered arterial study for him.  He denies any drainage or pus coming from the area.  He has been using a small amount of Silvadene to the area daily.  This is been ongoing for quite some time. Denies any systemic complaints such as fevers, chills, nausea, vomiting. No acute changes since last appointment, and no other complaints at this time.   Objective: AAO x3, NAD DP/PT pulses palpable bilaterally, CRT less than 3 seconds On the plantar aspect of the right foot submetatarsal 3 continue to be ulceration but today measures 0.4 x 0.4 x 0.5 cm.  The wound does appear to be deeper today but there is no probing to bone and there is no undermining or tunneling.  There is no edema to the area there is no surrounding erythema or ascending sialitis.  There is no malodor.  There is no clinical signs of infection noted. No open lesions or pre-ulcerative lesions.  No pain with calf compression, swelling, warmth, erythema  Assessment: Right foot chronic ulceration  Plan: -All treatment options discussed with the patient including all alternatives, risks, complications.  -X-rays were obtained and reviewed.  No evidence of acute fracture, osteomyelitis or soft tissue emphysema. -After verbal consent was obtained the wound was sharply debrided with a #312 scalpel to healthy, granular tissue.  For now we will continue the small amount of Silvadene.  We discussed total contact cast but discussed that we do that he has been completely nonweightbearing.  He states that he cannot do that right now.  I do encourage him to continue with a Darco shoe with a heel to help take  pressure off the forefoot.  I discussed these with his at all times with walking and not intermittently.  He states he can be more consistent about doing this.  There is no signs of infection but I want him to continue to monitor closely.  Also although he had normal ABIs on the exam that I did the office will follow up with the arterial study.  Also discussed wound care consult.  Trula Slade DPM

## 2017-10-18 ENCOUNTER — Ambulatory Visit (HOSPITAL_COMMUNITY)
Admission: RE | Admit: 2017-10-18 | Discharge: 2017-10-18 | Disposition: A | Discharge status: Home / Self Care | Payer: Medicare Other | Source: Ambulatory Visit | Attending: Internal Medicine | Admitting: Internal Medicine

## 2017-10-18 ENCOUNTER — Encounter (HOSPITAL_COMMUNITY): Payer: Self-pay | Admitting: Internal Medicine

## 2017-10-18 DIAGNOSIS — L97519 Non-pressure chronic ulcer of other part of right foot with unspecified severity: Secondary | ICD-10-CM | POA: Insufficient documentation

## 2017-10-18 DIAGNOSIS — E11621 Type 2 diabetes mellitus with foot ulcer: Secondary | ICD-10-CM | POA: Insufficient documentation

## 2017-10-18 NOTE — Progress Notes (Signed)
ABI's have been completed. Right 1.12 Left 1.35  10/18/17 9:41 AM Richard [name] RVT

## 2017-10-21 ENCOUNTER — Encounter: Payer: Self-pay | Admitting: Internal Medicine

## 2017-10-21 DIAGNOSIS — I739 Peripheral vascular disease, unspecified: Secondary | ICD-10-CM | POA: Insufficient documentation

## 2017-10-25 ENCOUNTER — Ambulatory Visit (INDEPENDENT_AMBULATORY_CARE_PROVIDER_SITE_OTHER): Payer: Medicare Other | Admitting: Dietician

## 2017-10-25 ENCOUNTER — Ambulatory Visit (INDEPENDENT_AMBULATORY_CARE_PROVIDER_SITE_OTHER): Payer: Medicare Other | Admitting: Podiatry

## 2017-10-25 ENCOUNTER — Encounter: Payer: Self-pay | Admitting: Podiatry

## 2017-10-25 DIAGNOSIS — L97519 Non-pressure chronic ulcer of other part of right foot with unspecified severity: Secondary | ICD-10-CM | POA: Diagnosis not present

## 2017-10-25 DIAGNOSIS — B351 Tinea unguium: Secondary | ICD-10-CM | POA: Diagnosis not present

## 2017-10-25 DIAGNOSIS — L97511 Non-pressure chronic ulcer of other part of right foot limited to breakdown of skin: Secondary | ICD-10-CM

## 2017-10-25 DIAGNOSIS — E11621 Type 2 diabetes mellitus with foot ulcer: Secondary | ICD-10-CM | POA: Diagnosis not present

## 2017-10-25 DIAGNOSIS — Z713 Dietary counseling and surveillance: Secondary | ICD-10-CM | POA: Diagnosis not present

## 2017-10-25 DIAGNOSIS — M79676 Pain in unspecified toe(s): Secondary | ICD-10-CM | POA: Diagnosis not present

## 2017-10-25 NOTE — Patient Instructions (Signed)
What we talked about today:   Things your chart shows are being due:  Hepatitis C Screening     04/14/1963 PNEUMOCOCCAL POLYSACCHARIDE VACCINE (1)    04/14/1969 URINE MICROALBUMIN    04/14/1978 TETANUS/TDAP      The plan:   To bring your meter  Call anytime with questions or concerns  Debera Lat Diabetes Educator [phone]

## 2017-10-25 NOTE — Progress Notes (Signed)
  Medical Nutrition Therapy:  Appt start time: 053 am end time:  945 am Visit # 1 this year, 8 in total  Assessment:  Primary concerns today: blood sugar control, support and meal planning  Richard [name] is here for a follow up visit. He is fine with his weight where it is ~ 230# he reports. His goal for his A1C is still 6-7%.  He is smoking on and off, currently 2 months without and feels like the smallest things or getting a congratulated and a big head can tilt him back to smoking.  He is attending school 830-230 pm for his GED Monday through Thursday and finishes in May. He is snacking less and eating two main meals breakfast and dinner.   Diabetes Medicines: tolerating jardiance and trulicity and has not noticed much difference in how he feels ANTHROPOMETRICS: weight-not done today as he has a shoes for his healing ulcer and also was not interested in his weight today.  BLOOD SUGAR:  Lab Results  Component Value Date   HGBA1C 8.0 10/10/2017     he did not bring his meter today and asked for another sample meter which was provided. He reports checking 3x/day and that they run 127-150  through the day and maybe a bit higher later in the day DIETARY INTAKE: Breakfast- oatmeal or cold cereal 1% milk, banana x 1-3, coffee with splenda Lunch- skips or has small snack Dinner- a protein (meat) and continues with several vegetables or grilled chicken on salad with blue cheese  Usual physical activity: ADLs and light walking, more than 15 minutes most days now with school, wants to start back to gym  Estimated energy needs: 2200-2500 calories 275-300 g carbohydrates  Progress Towards Goal(s):  In progress.   Nutritional Diagnosis:  NI-5.8.2 Excessive carbohydrate intake As related to his snacking and skipping meals reportedly improved As evidenced by his reported blood sugars at home.    Intervention:  Nutrition education and support about wound healing, health maintenance items due.     Coordination of care: none  Teaching Method Utilized: Visual,, Auditory Handouts given during visit include: AVS Barriers to learning/adherence to lifestyle change:  Lack of material resources, transportation, activity in winter Demonstrated degree of understanding via:  Teach Back   Monitoring/Evaluation:  Dietary intake, exercise,meter, and body weight in 1 month(s). Richard [name], RD 10/25/2017 9:14 AM.  .

## 2017-10-28 NOTE — Progress Notes (Signed)
Subjective: Richard [name] presents the office today for follow-up evaluation of a wound to the right foot.  He states the area is better.  He has been more compliant with wearing the Darco offloading shoe.  He denies any drainage or pus coming from the area he has been continue with Silvadene dressing changes.  He denies any increase in swelling or redness to his feet.  He also states his nails are thick, painful, discolored and they are tender inside his shoes and pressure.  He would like to have the nails trimmed today.  He has no other concerns today. Denies any systemic complaints such as fevers, chills, nausea, vomiting. No acute changes since last appointment, and no other complaints at this time.   Objective: AAO x3, NAD DP/PT pulses palpable bilaterally, CRT less than 3 seconds On the plantar aspect of the right foot submetatarsal 3 continue to be ulceration but today measures 0.2 x 0.2 x 0.2 cm.  The wound appears to be much improved.  There is hyperkeratotic tissue on the periphery of the wound.  There is no surrounding erythema, ascending sialitis.  There is no fluctuation or crepitation.  There is no malodor.  Nails are hypertrophic, dystrophic, brittle, discolored, elongated 10. No surrounding redness or drainage. Tenderness nails 1-5 bilaterally. No open lesions or pre-ulcerative lesions are identified today. No other open lesions or pre-ulcerative lesions.  No pain with calf compression, swelling, warmth, erythema  Assessment: Right foot chronic ulceration; symptomatic onychomycosis  Plan: -All treatment options discussed with the patient including all alternatives, risks, complications.  -After verbal consent was obtained the area, wound was cleaned with alcohol and the wound was sharply debrided with a #312 blade scalpel to healthy, granular tissue.  The wound appears to be much improved.  I want to continue with Silvadene dressing changes daily as well as offloading at all times and  continue with the surgical shoe.  Mild sprain signs or symptoms of infection which we again discussed today. -Nails are sharply debrided x10 without any complications or bleeding -He recently had an ABI done that was ordered by his primary care physician on the right side the ABI is normal although TBI is abnormal.  He has no symptoms of claudications bilaterally. -Follow-up as scheduled or sooner if needed.  He has no other questions or concerns today.  He agrees this plan.  Trula Slade DPM

## 2017-11-07 ENCOUNTER — Other Ambulatory Visit: Payer: Self-pay | Admitting: Internal Medicine

## 2017-11-07 NOTE — Telephone Encounter (Signed)
Pt called / informed Lyrica rx was called to the pharmacy today by Bonnita Nasuti A per chart.

## 2017-11-07 NOTE — Telephone Encounter (Signed)
Patient calling checking on medicine

## 2017-11-07 NOTE — Telephone Encounter (Signed)
PT NEEDS REFILL ON LYRICA 100MG , [facility]

## 2017-11-07 NOTE — Telephone Encounter (Signed)
Dr Heber Eldon thought he sent the script electronically 1/3, it printed, called pharm gave verbal to pharmacist

## 2017-11-15 ENCOUNTER — Encounter: Payer: Self-pay | Admitting: Podiatry

## 2017-11-15 ENCOUNTER — Ambulatory Visit (INDEPENDENT_AMBULATORY_CARE_PROVIDER_SITE_OTHER): Payer: Medicare Other | Admitting: Podiatry

## 2017-11-15 DIAGNOSIS — E11621 Type 2 diabetes mellitus with foot ulcer: Secondary | ICD-10-CM

## 2017-11-15 DIAGNOSIS — L84 Corns and callosities: Secondary | ICD-10-CM | POA: Diagnosis not present

## 2017-11-15 DIAGNOSIS — L97509 Non-pressure chronic ulcer of other part of unspecified foot with unspecified severity: Secondary | ICD-10-CM | POA: Diagnosis not present

## 2017-11-19 NOTE — Progress Notes (Signed)
Subjective: Clell presents the office today for follow-up evaluation of a wound to the right foot.  He states that he is doing well.  He has not been wearing the offloading boot.  He does try to keep a little bit of Silvadene on the wound daily.  Denies any drainage or pus or any redness or drainage or any swelling to his feet.  He has no new concerns today.  Denies any systemic complaints such as fevers, chills, nausea, vomiting. No acute changes since last appointment, and no other complaints at this time.   Objective: AAO x3, NAD DP/PT pulses palpable bilaterally, CRT less than 3 seconds On the plantar aspect of the right foot submetatarsal 3 is a hyperkeratotic lesion which appears to have dried blood.  Upon debridement the underlying wound appears to be healed and there is no ulceration identified today.  There is no drainage or pus or any surrounding erythema, ascending cellulitis.  There is no evidence of infection or abscess.  There is prominence of metatarsal heads plantarly. No other open lesions or pre-ulcerative lesions.  No pain with calf compression, swelling, warmth, erythema  Assessment: Right foot chronic ulceration which appears to be healed today.  Plan: -All treatment options discussed with the patient including all alternatives, risks, complications.  -After verbal consent was obtained I sharply debrided the hyperkeratotic lesion today without any complications or bleeding to reveal the underlying wound is healed.  There is no clinical signs of infection.  I told him to keep an offloading pad to this area and continue to monitor.  He has been wearing a regular shoe will continue with this.  Monitor for any further skin breakdown.  I will see him back next couple of weeks to ensure healing or sooner if any issues are to arise.  I encouraged him to call any questions or concerns or any change in symptoms.  He agrees with this plan is very thankful today.  Trula Slade  DPM

## 2017-11-22 ENCOUNTER — Ambulatory Visit: Payer: Medicare Other | Admitting: Dietician

## 2017-11-29 ENCOUNTER — Ambulatory Visit: Payer: Medicare Other | Admitting: Dietician

## 2017-12-06 ENCOUNTER — Ambulatory Visit (INDEPENDENT_AMBULATORY_CARE_PROVIDER_SITE_OTHER): Payer: Medicare Other | Admitting: Podiatry

## 2017-12-06 ENCOUNTER — Ambulatory Visit: Payer: Medicare Other | Admitting: Dietician

## 2017-12-06 DIAGNOSIS — L84 Corns and callosities: Secondary | ICD-10-CM

## 2017-12-06 DIAGNOSIS — L97509 Non-pressure chronic ulcer of other part of unspecified foot with unspecified severity: Secondary | ICD-10-CM | POA: Diagnosis not present

## 2017-12-06 DIAGNOSIS — E11621 Type 2 diabetes mellitus with foot ulcer: Secondary | ICD-10-CM | POA: Diagnosis not present

## 2017-12-06 NOTE — Progress Notes (Addendum)
Subjective: Richard [name] presents the office today for follow-up evaluation of a wound to the right foot.  Toe will be released today because he wants to go back into the water and start exercising.  He has not has any open sores and he denies any redness or swelling or any drainage coming from the area he has no new concerns today.  He has been wearing a regular shoe without any issues.  Denies any systemic complaints such as fevers, chills, nausea, vomiting. No acute changes since last appointment, and no other complaints at this time.   Objective: AAO x3, NAD DP/PT pulses palpable bilaterally, CRT less than 3 seconds On the plantar aspect of the right foot submetatarsal 3 is a hyperkeratotic lesion.  Upon debridement there is no underlying ulceration, drainage or any signs of infection.  The wound appears to be completely healed and remain closed.  There is no surrounding erythema or signs of infection present.  There is no drainage.  No other open lesions or pre-ulcerative lesions were identified bilaterally.  Hammertoes present No other open lesions or pre-ulcerative lesions.  No pain with calf compression, swelling, warmth, erythema  Assessment: Pre-ulcerative callus right foot  Plan: -All treatment options discussed with the patient including all alternatives, risks, complications.  -After verbal consent was obtained I sharply debrided the hyperkeratotic lesion today without any complications or bleeding to reveal the underlying wound is healed.  There is no signs of infection.  Is been wearing a regular shoe without any issues.  Point he can go back into the water but I want him to monitor his feet daily.  If there is any issues to call the office otherwise I will see him back in 3 months for diabetic foot evaluation given his history of ulceration with neuropathy.  He is very thankful today. -Given history of ulceration and neuropathy I do recommend diabetic shoes. Paperwork was completed today  for pre-certification of diabetic shoes. Will schedule him with [facility] for molding.   Trula Slade DPM

## 2017-12-12 ENCOUNTER — Other Ambulatory Visit: Payer: Self-pay | Admitting: *Deleted

## 2017-12-12 DIAGNOSIS — J449 Chronic obstructive pulmonary disease, unspecified: Secondary | ICD-10-CM

## 2017-12-12 MED ORDER — ALBUTEROL SULFATE HFA 108 (90 BASE) MCG/ACT IN AERS: 2.0000 | INHALATION_SPRAY | RESPIRATORY_TRACT | 5 refills | Status: DC | PRN

## 2017-12-23 ENCOUNTER — Telehealth: Payer: Self-pay | Admitting: Podiatry

## 2017-12-23 NOTE — Telephone Encounter (Signed)
lvm for pt to call to schedule an appt for diabetic shoe measurements °

## 2018-01-01 ENCOUNTER — Ambulatory Visit: Payer: Medicare Other | Admitting: Orthotics

## 2018-01-01 DIAGNOSIS — I739 Peripheral vascular disease, unspecified: Secondary | ICD-10-CM

## 2018-01-01 DIAGNOSIS — L84 Corns and callosities: Secondary | ICD-10-CM

## 2018-01-01 DIAGNOSIS — L97509 Non-pressure chronic ulcer of other part of unspecified foot with unspecified severity: Secondary | ICD-10-CM

## 2018-01-01 DIAGNOSIS — L97511 Non-pressure chronic ulcer of other part of right foot limited to breakdown of skin: Secondary | ICD-10-CM

## 2018-01-01 DIAGNOSIS — E11621 Type 2 diabetes mellitus with foot ulcer: Secondary | ICD-10-CM

## 2018-01-01 NOTE — Progress Notes (Signed)
Patient presents today for diabetic shoe measurement and foam casting.  Goals of diabetic shoes/inserts to offer protection from conditions secondary to DM2, offer relief from sheer forces that could lead to ulcerations, protect the foot, and offer greater stability. Patient is under supervision of DPM Wagoner Physician managing patients DM2: [place] Patient has following documented conditions to qualify for diabetic shoes/inserts: DM2, hx of ulcter Patient measured with brannock device: 13w

## 2018-01-08 NOTE — Progress Notes (Signed)
Subjective:  HPI: Richard [name] is a 57 y.o. male who presents for 3 month follow up of DM  Please see Assessment and Plan below for the status of his chronic medical problems.  Review of Systems: Review of Systems  Constitutional: Negative for chills and fever.  Respiratory: Negative for cough and shortness of breath.   Cardiovascular: Negative for chest pain.  Gastrointestinal: Negative for abdominal pain, constipation, diarrhea and vomiting.  Genitourinary: Negative for dysuria and frequency.  Endo/Heme/Allergies: Negative for polydipsia.  Psychiatric/Behavioral: Negative for depression and substance abuse.    Objective:  Physical Exam: Vitals:   01/09/18 0916  BP: 121/70  Pulse: 87  Temp: 98 F (36.7 C)  TempSrc: Oral  SpO2: 98%  Weight: 219 lb 6.4 oz (99.5 kg)  Height: 6\' 3"  (1.905 m)   Physical Exam  Constitutional: He is well-developed, well-nourished, and in no distress.  Cardiovascular: Normal rate and regular rhythm.  Pulmonary/Chest: Effort normal and breath sounds normal.  Abdominal: Soft. There is no tenderness.  Musculoskeletal: He exhibits no edema.       Right shoulder: He exhibits decreased range of motion and tenderness. He exhibits no effusion.       Left shoulder: He exhibits normal range of motion and no tenderness.       Right knee: He exhibits normal range of motion, no swelling and no effusion.       Left knee: He exhibits no swelling, no effusion, normal alignment, no LCL laxity, normal meniscus and no MCL laxity. Tenderness found. Medial joint line and lateral joint line tenderness noted.  Right shoulder. Decreased ROM with back scratch, abduction of right compared to left, painful arc from 90-130degrees, tenderness at tip of acromion, negative yergsons, negative empty can.  Nursing note and vitals reviewed.  Assessment & Plan:  Peripheral arterial disease ([place]) Had stopped ASA due to news stories about primary prevention,  Given his PAD  and CAD I have encouraged him to resume 81mg  daily  Uncontrolled type 2 diabetes mellitus with ophthalmic complication, without long-term current use of insulin (HCC) HPI: Feels he is doing a little better, no complaints of high or low blood sugars, no overt sympotms. Trulicity has been easy to use.  A: Uncontrolled Type 2 DM  P:A1c down to 7.8, congratulated but we can probably due better Increase Trulicity to 1.5mg  weekly, Continue empagliflozin 10mg  daily Continue metformin 1g BID  Erectile dysfunction HPI: reports Viagra even at 100mg  dose is not helping maintain erection.  Continues to have insterest however wondering what else he can do, asking about commercials on TV.  A: Erectile dysfunction  P: Discussed ongoing importance of DM control. Smoking cessation.  I discussed I do not want him taking OTC ED supplements rather I will have him seen by urology.  Bursitis of right shoulder HPI: reports shoulder pain with certain motions, worse behind back and lifiting arm up. Worse at night laying on right side, no injury, no fever/chills.  A: Burisitis of right shoulder  P: Trial of voltaren gel to area.  Osteoarthritis of left knee HPI: Left knee pain, intermittent, some days worse than others, worse going to standing from stitting, some stiffness maybe 10-15 minutes.  Occasionally feels knee may "give out" on "bad days" but generally does not.  Does not take anything for this.  No hx of injury to knee.  A: Primary OA of left knee  P: Trial of Voltaren gel. May consider PT referral versus injection if not better.  Medications Ordered Meds ordered this encounter  Medications  . levocetirizine (XYZAL) 5 MG tablet    Sig: Take 1 tablet (5 mg total) by mouth every evening.    Dispense:  90 tablet    Refill:  3  . Dulaglutide (TRULICITY) 1.5 YT/0.1SW SOPN    Sig: Inject 1.5 mg into the skin once a week.    Dispense:  12 pen    Refill:  3    Place on file for next refill.  Dose increase from last perscription.  . diclofenac sodium (VOLTAREN) 1 % GEL    Sig: Apply 2-4 g topically 4 (four) times daily. Apply 2g to right shoulder, 4 grams to left knee.    Dispense:  100 g    Refill:  3   Other Orders Orders Placed This Encounter  Procedures  . Microalbumin / Creatinine Urine Ratio  . Glucose, capillary  . Ambulatory referral to Urology    Referral Priority:   Routine    Referral Type:   Consultation    Referral Reason:   Specialty Services Required    Requested Specialty:   Urology    Number of Visits Requested:   1  . POC Hbg A1C   Follow Up: Return in about 3 months (around 04/10/2018).

## 2018-01-09 ENCOUNTER — Ambulatory Visit (INDEPENDENT_AMBULATORY_CARE_PROVIDER_SITE_OTHER): Payer: Medicare Other | Admitting: Internal Medicine

## 2018-01-09 ENCOUNTER — Encounter: Payer: Self-pay | Admitting: Internal Medicine

## 2018-01-09 ENCOUNTER — Other Ambulatory Visit: Payer: Self-pay

## 2018-01-09 VITALS — BP 121/70 | HR 87 | Temp 98.0°F | Ht 75.0 in | Wt 219.4 lb

## 2018-01-09 DIAGNOSIS — I2510 Atherosclerotic heart disease of native coronary artery without angina pectoris: Secondary | ICD-10-CM

## 2018-01-09 DIAGNOSIS — IMO0002 Reserved for concepts with insufficient information to code with codable children: Secondary | ICD-10-CM

## 2018-01-09 DIAGNOSIS — Z7982 Long term (current) use of aspirin: Secondary | ICD-10-CM

## 2018-01-09 DIAGNOSIS — N529 Male erectile dysfunction, unspecified: Secondary | ICD-10-CM | POA: Diagnosis not present

## 2018-01-09 DIAGNOSIS — I739 Peripheral vascular disease, unspecified: Secondary | ICD-10-CM

## 2018-01-09 DIAGNOSIS — E1165 Type 2 diabetes mellitus with hyperglycemia: Principal | ICD-10-CM

## 2018-01-09 DIAGNOSIS — E1151 Type 2 diabetes mellitus with diabetic peripheral angiopathy without gangrene: Secondary | ICD-10-CM

## 2018-01-09 DIAGNOSIS — Z79899 Other long term (current) drug therapy: Secondary | ICD-10-CM | POA: Diagnosis not present

## 2018-01-09 DIAGNOSIS — Z96651 Presence of right artificial knee joint: Secondary | ICD-10-CM | POA: Insufficient documentation

## 2018-01-09 DIAGNOSIS — M1712 Unilateral primary osteoarthritis, left knee: Secondary | ICD-10-CM | POA: Diagnosis not present

## 2018-01-09 DIAGNOSIS — Z7984 Long term (current) use of oral hypoglycemic drugs: Secondary | ICD-10-CM | POA: Diagnosis not present

## 2018-01-09 DIAGNOSIS — M1711 Unilateral primary osteoarthritis, right knee: Secondary | ICD-10-CM | POA: Insufficient documentation

## 2018-01-09 DIAGNOSIS — M7551 Bursitis of right shoulder: Secondary | ICD-10-CM

## 2018-01-09 DIAGNOSIS — E1139 Type 2 diabetes mellitus with other diabetic ophthalmic complication: Secondary | ICD-10-CM

## 2018-01-09 LAB — POCT GLYCOSYLATED HEMOGLOBIN (HGB A1C): HEMOGLOBIN A1C: 7.8

## 2018-01-09 LAB — GLUCOSE, CAPILLARY: Glucose-Capillary: 120 mg/dL — ABNORMAL HIGH (ref 65–99)

## 2018-01-09 MED ORDER — DICLOFENAC SODIUM 1 % TD GEL: 2.0000 g | Freq: Four times a day (QID) | TRANSDERMAL | 3 refills | Status: DC

## 2018-01-09 MED ORDER — DULAGLUTIDE 1.5 MG/0.5ML ~~LOC~~ SOAJ: 1.5000 mg | SUBCUTANEOUS | 3 refills | Status: DC

## 2018-01-09 MED ORDER — LEVOCETIRIZINE DIHYDROCHLORIDE 5 MG PO TABS: 5.0000 mg | ORAL_TABLET | Freq: Every evening | ORAL | 3 refills | Status: DC

## 2018-01-09 NOTE — Assessment & Plan Note (Signed)
HPI: Feels he is doing a little better, no complaints of high or low blood sugars, no overt sympotms. Trulicity has been easy to use.  A: Uncontrolled Type 2 DM  P:A1c down to 7.8, congratulated but we can probably due better Increase Trulicity to 1.5mg  weekly, Continue empagliflozin 10mg  daily Continue metformin 1g BID

## 2018-01-09 NOTE — Assessment & Plan Note (Signed)
Had stopped ASA due to news stories about primary prevention,  Given his PAD and CAD I have encouraged him to resume 81mg  daily

## 2018-01-09 NOTE — Patient Instructions (Signed)
I am increasing trulicity to 1.5mg  dose once a week.  I want you to try voltaren gel on your shoulder and knee, if this doesn't work we may consider an injection or Xrays

## 2018-01-09 NOTE — Assessment & Plan Note (Signed)
HPI: reports Viagra even at 100mg  dose is not helping maintain erection.  Continues to have insterest however wondering what else he can do, asking about commercials on TV.  A: Erectile dysfunction  P: Discussed ongoing importance of DM control. Smoking cessation.  I discussed I do not want him taking OTC ED supplements rather I will have him seen by urology.

## 2018-01-09 NOTE — Assessment & Plan Note (Signed)
HPI: reports shoulder pain with certain motions, worse behind back and lifiting arm up. Worse at night laying on right side, no injury, no fever/chills.  A: Burisitis of right shoulder  P: Trial of voltaren gel to area.

## 2018-01-09 NOTE — Assessment & Plan Note (Addendum)
HPI: Left knee pain, intermittent, some days worse than others, worse going to standing from stitting, some stiffness maybe 10-15 minutes.  Occasionally feels knee may "give out" on "bad days" but generally does not.  Does not take anything for this.  No hx of injury to knee.  A: Primary OA of left knee  P: Trial of Voltaren gel. May consider PT referral versus injection if not better.

## 2018-01-10 DIAGNOSIS — H401122 Primary open-angle glaucoma, left eye, moderate stage: Secondary | ICD-10-CM | POA: Diagnosis not present

## 2018-01-10 DIAGNOSIS — H04123 Dry eye syndrome of bilateral lacrimal glands: Secondary | ICD-10-CM | POA: Diagnosis not present

## 2018-01-10 LAB — MICROALBUMIN / CREATININE URINE RATIO
Creatinine, Urine: 81.6 mg/dL
MICROALB/CREAT RATIO: 6.7 mg/g{creat} (ref 0.0–30.0)
MICROALBUM., U, RANDOM: 5.5 ug/mL

## 2018-01-13 ENCOUNTER — Encounter (HOSPITAL_COMMUNITY): Payer: Self-pay | Admitting: Neurology

## 2018-01-13 ENCOUNTER — Inpatient Hospital Stay (HOSPITAL_COMMUNITY)
Admission: EM | Admit: 2018-01-13 | Discharge: 2018-01-15 | DRG: 202 | Disposition: A | Discharge status: Home / Self Care | Payer: Medicare Other | Attending: Student in an Organized Health Care Education/Training Program | Admitting: Student in an Organized Health Care Education/Training Program

## 2018-01-13 ENCOUNTER — Emergency Department (HOSPITAL_COMMUNITY): Payer: Medicare Other

## 2018-01-13 ENCOUNTER — Other Ambulatory Visit: Payer: Self-pay

## 2018-01-13 DIAGNOSIS — Z7951 Long term (current) use of inhaled steroids: Secondary | ICD-10-CM | POA: Diagnosis not present

## 2018-01-13 DIAGNOSIS — F17211 Nicotine dependence, cigarettes, in remission: Secondary | ICD-10-CM | POA: Diagnosis not present

## 2018-01-13 DIAGNOSIS — R05 Cough: Secondary | ICD-10-CM | POA: Diagnosis not present

## 2018-01-13 DIAGNOSIS — J9601 Acute respiratory failure with hypoxia: Secondary | ICD-10-CM | POA: Diagnosis not present

## 2018-01-13 DIAGNOSIS — H409 Unspecified glaucoma: Secondary | ICD-10-CM | POA: Diagnosis present

## 2018-01-13 DIAGNOSIS — J984 Other disorders of lung: Secondary | ICD-10-CM | POA: Diagnosis not present

## 2018-01-13 DIAGNOSIS — Z8249 Family history of ischemic heart disease and other diseases of the circulatory system: Secondary | ICD-10-CM

## 2018-01-13 DIAGNOSIS — Z79899 Other long term (current) drug therapy: Secondary | ICD-10-CM

## 2018-01-13 DIAGNOSIS — J441 Chronic obstructive pulmonary disease with (acute) exacerbation: Secondary | ICD-10-CM | POA: Diagnosis not present

## 2018-01-13 DIAGNOSIS — I2510 Atherosclerotic heart disease of native coronary artery without angina pectoris: Secondary | ICD-10-CM | POA: Diagnosis present

## 2018-01-13 DIAGNOSIS — J849 Interstitial pulmonary disease, unspecified: Secondary | ICD-10-CM | POA: Diagnosis not present

## 2018-01-13 DIAGNOSIS — R0981 Nasal congestion: Secondary | ICD-10-CM | POA: Diagnosis not present

## 2018-01-13 DIAGNOSIS — E1140 Type 2 diabetes mellitus with diabetic neuropathy, unspecified: Secondary | ICD-10-CM | POA: Diagnosis not present

## 2018-01-13 DIAGNOSIS — R0789 Other chest pain: Secondary | ICD-10-CM | POA: Diagnosis not present

## 2018-01-13 DIAGNOSIS — E785 Hyperlipidemia, unspecified: Secondary | ICD-10-CM | POA: Diagnosis present

## 2018-01-13 DIAGNOSIS — Z87891 Personal history of nicotine dependence: Secondary | ICD-10-CM | POA: Diagnosis not present

## 2018-01-13 DIAGNOSIS — J45901 Unspecified asthma with (acute) exacerbation: Secondary | ICD-10-CM | POA: Diagnosis present

## 2018-01-13 DIAGNOSIS — Z7982 Long term (current) use of aspirin: Secondary | ICD-10-CM

## 2018-01-13 DIAGNOSIS — R0602 Shortness of breath: Secondary | ICD-10-CM | POA: Diagnosis not present

## 2018-01-13 DIAGNOSIS — Z7984 Long term (current) use of oral hypoglycemic drugs: Secondary | ICD-10-CM

## 2018-01-13 DIAGNOSIS — R062 Wheezing: Secondary | ICD-10-CM | POA: Diagnosis not present

## 2018-01-13 DIAGNOSIS — I10 Essential (primary) hypertension: Secondary | ICD-10-CM | POA: Diagnosis not present

## 2018-01-13 HISTORY — DX: Chronic obstructive pulmonary disease, unspecified: J44.9

## 2018-01-13 LAB — URINALYSIS, ROUTINE W REFLEX MICROSCOPIC
BACTERIA UA: NONE SEEN
BILIRUBIN URINE: NEGATIVE
Glucose, UA: >=500 mg/dL — AB
Hgb urine dipstick: NEGATIVE
Ketones, ur: 5 mg/dL — AB
Leukocytes, UA: NEGATIVE
NITRITE: NEGATIVE
Protein, ur: NEGATIVE mg/dL
RBC / HPF: NONE SEEN RBC/hpf (ref 0–5)
SPECIFIC GRAVITY, URINE: 1.032 — AB (ref 1.005–1.030)
Squamous Epithelial / LPF: NONE SEEN
pH: 5 (ref 5.0–8.0)

## 2018-01-13 LAB — CBC WITH DIFFERENTIAL/PLATELET
Basophils Absolute: 0.1 10*3/uL (ref 0.0–0.1)
Basophils Relative: 1 %
Eosinophils Absolute: 0.6 10*3/uL (ref 0.0–0.7)
Eosinophils Relative: 9 %
HCT: 42.8 % (ref 39.0–52.0)
Hemoglobin: 13.7 g/dL (ref 13.0–17.0)
Lymphocytes Relative: 13 %
Lymphs Abs: 0.9 10*3/uL (ref 0.7–4.0)
MCH: 26.9 pg (ref 26.0–34.0)
MCHC: 32 g/dL (ref 30.0–36.0)
MCV: 84.1 fL (ref 78.0–100.0)
Monocytes Absolute: 0.7 10*3/uL (ref 0.1–1.0)
Monocytes Relative: 10 %
Neutro Abs: 4.2 10*3/uL (ref 1.7–7.7)
Neutrophils Relative %: 67 %
Platelets: 278 10*3/uL (ref 150–400)
RBC: 5.09 MIL/uL (ref 4.22–5.81)
RDW: 13.2 % (ref 11.5–15.5)
WBC: 6.4 10*3/uL (ref 4.0–10.5)

## 2018-01-13 LAB — CBG MONITORING, ED: GLUCOSE-CAPILLARY: 169 mg/dL — AB (ref 65–99)

## 2018-01-13 LAB — BASIC METABOLIC PANEL
Anion gap: 16 — ABNORMAL HIGH (ref 5–15)
BUN: 16 mg/dL (ref 6–20)
CO2: 21 mmol/L — ABNORMAL LOW (ref 22–32)
Calcium: 9.4 mg/dL (ref 8.9–10.3)
Chloride: 100 mmol/L — ABNORMAL LOW (ref 101–111)
Creatinine, Ser: 0.98 mg/dL (ref 0.61–1.24)
GFR calc Af Amer: >60 mL/min (ref >60)
GFR calc non Af Amer: >60 mL/min (ref >60)
Glucose, Bld: 197 mg/dL — ABNORMAL HIGH (ref 65–99)
Potassium: 4.4 mmol/L (ref 3.5–5.1)
Sodium: 137 mmol/L (ref 135–145)

## 2018-01-13 LAB — TROPONIN I: Troponin I: <0.03 ng/mL (ref <0.03)

## 2018-01-13 MED ORDER — AZITHROMYCIN 250 MG PO TABS: 500.0000 mg | ORAL_TABLET | Freq: Every day | ORAL | Status: DC

## 2018-01-13 MED ORDER — TIMOLOL MALEATE 0.5 % OP SOLN
1.0000 [drp] | Freq: Two times a day (BID) | OPHTHALMIC | Status: DC
  Administered 2018-01-13 – 2018-01-15 (×4): 1 [drp] via OPHTHALMIC
  Filled 2018-01-13 (×2): qty 5

## 2018-01-13 MED ORDER — IPRATROPIUM-ALBUTEROL 0.5-2.5 (3) MG/3ML IN SOLN
3.0000 mL | RESPIRATORY_TRACT | Status: DC
  Administered 2018-01-13 – 2018-01-14 (×4): 3 mL via RESPIRATORY_TRACT
  Filled 2018-01-13 (×4): qty 3

## 2018-01-13 MED ORDER — ONDANSETRON HCL 4 MG/2ML IJ SOLN
4.0000 mg | Freq: Four times a day (QID) | INTRAMUSCULAR | Status: DC | PRN
Start: 2018-01-13 — End: 2018-01-15

## 2018-01-13 MED ORDER — ENOXAPARIN SODIUM 40 MG/0.4ML ~~LOC~~ SOLN
40.0000 mg | SUBCUTANEOUS | Status: DC
  Administered 2018-01-14 – 2018-01-15 (×2): 40 mg via SUBCUTANEOUS
  Filled 2018-01-13 (×2): qty 0.4

## 2018-01-13 MED ORDER — INSULIN ASPART 100 UNIT/ML ~~LOC~~ SOLN
0.0000 [IU] | Freq: Three times a day (TID) | SUBCUTANEOUS | Status: DC
  Administered 2018-01-14: 7 [IU] via SUBCUTANEOUS
  Administered 2018-01-14: 4 [IU] via SUBCUTANEOUS
  Administered 2018-01-14: 3 [IU] via SUBCUTANEOUS
  Administered 2018-01-15: 4 [IU] via SUBCUTANEOUS
  Administered 2018-01-15: 7 [IU] via SUBCUTANEOUS

## 2018-01-13 MED ORDER — ALBUTEROL SULFATE (2.5 MG/3ML) 0.083% IN NEBU
5.0000 mg | INHALATION_SOLUTION | Freq: Once | RESPIRATORY_TRACT | Status: AC
  Administered 2018-01-13: 5 mg via RESPIRATORY_TRACT

## 2018-01-13 MED ORDER — POLYETHYLENE GLYCOL 3350 17 G PO PACK: 17.0000 g | PACK | Freq: Every day | ORAL | Status: DC | PRN

## 2018-01-13 MED ORDER — MAGNESIUM SULFATE 2 GM/50ML IV SOLN
2.0000 g | Freq: Once | INTRAVENOUS | Status: AC
  Administered 2018-01-13: 2 g via INTRAVENOUS
  Filled 2018-01-13: qty 50

## 2018-01-13 MED ORDER — ALBUTEROL (5 MG/ML) CONTINUOUS INHALATION SOLN
10.0000 mg/h | INHALATION_SOLUTION | Freq: Once | RESPIRATORY_TRACT | Status: AC
  Administered 2018-01-13: 10 mg/h via RESPIRATORY_TRACT

## 2018-01-13 MED ORDER — INSULIN ASPART 100 UNIT/ML ~~LOC~~ SOLN: 0.0000 [IU] | Freq: Every day | SUBCUTANEOUS | Status: DC

## 2018-01-13 MED ORDER — PREDNISONE 20 MG PO TABS
60.0000 mg | ORAL_TABLET | Freq: Once | ORAL | Status: AC
  Administered 2018-01-13: 60 mg via ORAL
  Filled 2018-01-13: qty 3

## 2018-01-13 MED ORDER — ASPIRIN EC 81 MG PO TBEC
81.0000 mg | DELAYED_RELEASE_TABLET | Freq: Every day | ORAL | Status: DC
  Administered 2018-01-14 – 2018-01-15 (×2): 81 mg via ORAL
  Filled 2018-01-13 (×2): qty 1

## 2018-01-13 MED ORDER — ALBUTEROL (5 MG/ML) CONTINUOUS INHALATION SOLN
7.5000 mg/h | INHALATION_SOLUTION | Freq: Once | RESPIRATORY_TRACT | Status: DC
  Filled 2018-01-13: qty 20

## 2018-01-13 MED ORDER — ATORVASTATIN CALCIUM 40 MG PO TABS
40.0000 mg | ORAL_TABLET | Freq: Every day | ORAL | Status: DC
  Administered 2018-01-14 – 2018-01-15 (×2): 40 mg via ORAL
  Filled 2018-01-13 (×2): qty 1

## 2018-01-13 MED ORDER — PREDNISONE 50 MG PO TABS
50.0000 mg | ORAL_TABLET | Freq: Every day | ORAL | Status: DC
  Administered 2018-01-14 – 2018-01-15 (×2): 50 mg via ORAL
  Filled 2018-01-13 (×2): qty 1

## 2018-01-13 MED ORDER — ACETAMINOPHEN 325 MG PO TABS: 650.0000 mg | ORAL_TABLET | Freq: Four times a day (QID) | ORAL | Status: DC | PRN

## 2018-01-13 MED ORDER — ALBUTEROL SULFATE (2.5 MG/3ML) 0.083% IN NEBU
2.5000 mg | INHALATION_SOLUTION | RESPIRATORY_TRACT | Status: DC | PRN
  Administered 2018-01-13: 2.5 mg via RESPIRATORY_TRACT
  Filled 2018-01-13: qty 3

## 2018-01-13 MED ORDER — ALBUTEROL SULFATE (2.5 MG/3ML) 0.083% IN NEBU
5.0000 mg | INHALATION_SOLUTION | Freq: Once | RESPIRATORY_TRACT | Status: AC
  Administered 2018-01-13: 5 mg via RESPIRATORY_TRACT
  Filled 2018-01-13: qty 6

## 2018-01-13 MED ORDER — BENZONATATE 100 MG PO CAPS
200.0000 mg | ORAL_CAPSULE | Freq: Two times a day (BID) | ORAL | Status: DC | PRN
  Administered 2018-01-13: 200 mg via ORAL
  Filled 2018-01-13: qty 2

## 2018-01-13 MED ORDER — ALBUTEROL (5 MG/ML) CONTINUOUS INHALATION SOLN
10.0000 mg/h | INHALATION_SOLUTION | RESPIRATORY_TRACT | Status: DC
  Administered 2018-01-13: 10 mg/h via RESPIRATORY_TRACT

## 2018-01-13 MED ORDER — ALBUTEROL SULFATE (2.5 MG/3ML) 0.083% IN NEBU
INHALATION_SOLUTION | RESPIRATORY_TRACT | Status: AC
  Filled 2018-01-13: qty 6

## 2018-01-13 MED ORDER — AZITHROMYCIN 250 MG PO TABS: 250.0000 mg | ORAL_TABLET | Freq: Every day | ORAL | Status: DC

## 2018-01-13 MED ORDER — LORATADINE 10 MG PO TABS
10.0000 mg | ORAL_TABLET | Freq: Every evening | ORAL | Status: DC
  Administered 2018-01-14: 10 mg via ORAL
  Filled 2018-01-13: qty 1

## 2018-01-13 MED ORDER — PREGABALIN 100 MG PO CAPS
100.0000 mg | ORAL_CAPSULE | Freq: Three times a day (TID) | ORAL | Status: DC
  Administered 2018-01-14 – 2018-01-15 (×5): 100 mg via ORAL
  Filled 2018-01-13 (×5): qty 1

## 2018-01-13 MED ORDER — LATANOPROST 0.005 % OP SOLN
1.0000 [drp] | Freq: Every day | OPHTHALMIC | Status: DC
  Administered 2018-01-14: 1 [drp] via OPHTHALMIC
  Filled 2018-01-13: qty 2.5

## 2018-01-13 MED ORDER — ONDANSETRON HCL 4 MG PO TABS: 4.0000 mg | ORAL_TABLET | Freq: Four times a day (QID) | ORAL | Status: DC | PRN

## 2018-01-13 MED ORDER — ACETAMINOPHEN 650 MG RE SUPP: 650.0000 mg | Freq: Four times a day (QID) | RECTAL | Status: DC | PRN

## 2018-01-13 NOTE — ED Notes (Signed)
Pt ambulated with pulse ox, saturation dropped to 89%

## 2018-01-13 NOTE — ED Notes (Signed)
Pt changed into gown.

## 2018-01-13 NOTE — ED Notes (Signed)
Lab notified of add-on orders.

## 2018-01-13 NOTE — ED Provider Notes (Signed)
[facility] EMERGENCY DEPARTMENT Provider Note   CSN: [id number] Arrival date & time: 01/13/18  1049   History   Chief Complaint Chief Complaint  Patient presents with  . Shortness of Breath    HPI Richard [name] is a 57 y.o. male.  HPI   57-year-old with a history of COPD presents today with complaints of COPD exacerbation.  Patient notes last week under nasal congestion and cough with intermittent tightness in his chest.  He notes this feels similar to previous COPD exacerbations, he reports that he was using an inhaler at home without improvement in symptoms.  He notes that this worsened over the next several days noting yesterday severe chest tightness to the point where he almost passed out.  Patient reports today unable to ambulate due to significant dyspnea.  Patient reports tightness and pressure in his chest today.  Patient denies any fever but notes ongoing cough and nasal congestion.  Patient remote history of smoking, no longer a smoker.   Past Medical History:  Diagnosis Date  . Asthma   . Clostridium difficile colitis 09/09/2014  . COPD (chronic obstructive pulmonary disease) (HCC)   . Diabetes mellitus without complication (HCC)    diagnosed in 2013  . Essential hypertension   . Glaucoma    bilateral, surgery on left eye but not right  . Hyperlipidemia   . Neuromuscular disorder (HCC)    neuropathy in feet  . 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 Substance abuse (HCC)    crack - recovered x 30 yrs    Patient Active Problem List   Diagnosis Date Noted  . Bursitis of right shoulder 01/09/2018  . Osteoarthritis of left knee 01/09/2018  . Peripheral arterial disease (HCC) 10/21/2017  . 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 Erectile dysfunction 03/06/2017  . Abdominal aortic atherosclerosis (HCC) 01/18/2017  . Vitamin D deficiency 07/02/2016  . Tubular adenoma of colon 02/06/2016  . Diabetic neuropathy, painful (HCC)  11/30/2015  . Overweight (BMI 25.0-29.9) 11/30/2015  . CAD in native artery 10/20/2015  . Tobacco abuse   . Essential hypertension   . 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 COPD with asthma (HCC)     Past Surgical History:  Procedure Laterality Date  . APPENDECTOMY    . CARDIAC CATHETERIZATION N/A 09/26/2015   Procedure: Left Heart Cath and Coronary Angiography;  Surgeon: Jayadeep S Varanasi, MD;  Location: [facility] INVASIVE CV LAB;  Service: Cardiovascular;  Laterality: N/A;  . left eye surgery Left    glaucoma  . SKIN GRAFT          Home Medications    Prior to Admission medications   Medication Sig Start Date End Date Taking? Authorizing Provider  albuterol (PROAIR HFA) 108 (90 Base) MCG/ACT inhaler Inhale 2 puffs into the lungs every 4 (four) hours as needed for wheezing or shortness of breath.   Yes [provider]  Artificial Tear Ointment (DRY EYES OP) Place 1-2 drops into both eyes every 6 (six) hours as needed (for dry eyes).   Yes [provider]  aspirin EC 81 MG EC tablet Take 1 tablet (81 mg total) by mouth daily. 09/26/15  Yes Burns, Alexa R, MD  atorvastatin (LIPITOR) 40 MG tablet Take 1 tablet (40 mg total) by mouth daily. 10/10/17 10/10/18 Yes Hoffman, Erik C, DO  BREO ELLIPTA 100-25 MCG/INH AEPB Take 1 puff by   mouth daily. 11/02/17  Yes [provider]  diclofenac sodium (VOLTAREN) 1 % GEL Apply 2-4 g topically 4 (four) times daily. Apply 2g to right shoulder, 4 grams to left knee. 01/09/18  Yes [name], DO  Dulaglutide (TRULICITY) 1.5 GD/9.2EQ SOPN Inject 1.5 mg into the skin once a week. Patient taking differently: Inject 1.5 mg into the skin every Friday.  01/09/18  Yes [name], DO  empagliflozin (JARDIANCE) 10 MG TABS tablet Take 10 mg by mouth daily. 10/10/17  Yes [name], DO  latanoprost (XALATAN) 0.005 % ophthalmic solution Instill 1 drop into  both eyes at bedtime 10/21/15  Yes [provider]  levocetirizine (XYZAL) 5 MG tablet Take 1 tablet (5 mg total) by mouth every evening. 01/09/18  Yes [name], DO  metFORMIN (GLUCOPHAGE) 1000 MG tablet Take 1 tablet (1,000 mg total) by mouth 2 (two) times daily with a meal. 10/10/17  Yes Hoffman, Rachel Moulds, DO  mometasone-formoterol (DULERA) 100-5 MCG/ACT AERO Inhale 2 puffs into the lungs 2 (two) times daily. 10/10/17  Yes [name], DO  Multiple Vitamins-Minerals (MULTIVITAMIN ADULT PO) Take 1 tablet by mouth daily. 05/20/17  Yes [provider]  pregabalin (LYRICA) 100 MG capsule Take 1 capsule (100 mg total) by mouth 3 (three) times daily. 10/10/17  Yes [name], DO  sildenafil (VIAGRA) 100 MG tablet Take 1 tablet (100 mg total) by mouth as needed for erectile dysfunction. 10/10/17 10/10/18 Yes [name], DO  timolol (TIMOPTIC) 0.5 % ophthalmic solution Place 1 drop into both eyes 2 (two) times daily. 01/10/18  Yes [provider]  ACCU-CHEK FASTCLIX LANCETS MISC Check blood sugar two times a day 05/09/17   [name], DO  albuterol (PROVENTIL HFA;VENTOLIN HFA) 108 (90 Base) MCG/ACT inhaler Inhale 2 puffs into the lungs every 4 (four) hours as needed for wheezing or shortness of breath. Patient not taking: Reported on 01/13/2018 12/12/17   [name], DO  Blood Glucose Monitoring Suppl (ACCU-CHEK GUIDE) w/Device KIT 1 each by Does not apply route 2 (two) times daily. 11/21/16   [name], DO  glucose blood (ACCU-CHEK GUIDE) test strip Check blood sugar two times a day 05/09/17   [name], DO    Family History Family History  Problem Relation Age of Onset  . Hypertension Mother   . Colon cancer Neg Hx   . Colon polyps Neg Hx   . Esophageal cancer Neg Hx   . Rectal cancer Neg Hx   . Stomach cancer Neg Hx     Social History Social History   Tobacco Use  . Smoking status: Former Smoker    Packs/day: 1.00    Types: Cigarettes    Last  attempt to quit: 08/13/2016    Years since quitting: 1.4  . Smokeless tobacco: Former Systems developer    Quit date: 08/24/2016  . Tobacco comment: 5-6 CIAGARETTES A DAY  Substance Use Topics  . Alcohol use: No    Alcohol/week: 0.0 oz  . Drug use: No     Allergies   Patient has no known allergies.   Review of Systems Review of Systems  All other systems reviewed and are negative.   Physical Exam Updated Vital Signs BP 128/80   Pulse 99   Temp 98.2 F (36.8 C) (Oral)   Resp 17   SpO2 94%   Physical Exam  Constitutional: He is oriented to person, place, and time. He appears well-developed  and well-nourished.  HENT:  Head: Normocephalic and atraumatic.  Eyes: Pupils are equal, round, and reactive to light. Conjunctivae are normal. Right eye exhibits no discharge. Left eye exhibits no discharge. No scleral icterus.  Neck: Normal range of motion. No JVD present. No tracheal deviation present.  Cardiovascular: Normal rate, regular rhythm, normal heart sounds and intact distal pulses.  Pulmonary/Chest: Effort normal. No stridor.  Diffuse inspiratory and expiratory wheeze  Neurological: He is alert and oriented to person, place, and time. Coordination normal.  Psychiatric: He has a normal mood and affect. His behavior is normal. Judgment and thought content normal.  Nursing note and vitals reviewed.   ED Treatments / Results  Labs (all labs ordered are listed, but only abnormal results are displayed) Labs Reviewed  BASIC METABOLIC PANEL - Abnormal; Notable for the following components:      Result Value   Chloride 100 (*)    CO2 21 (*)    Glucose, Bld 197 (*)    Anion gap 16 (*)    All other components within normal limits  CBC WITH DIFFERENTIAL/PLATELET  TROPONIN I  INFLUENZA PANEL BY PCR (TYPE A & B)  URINALYSIS, ROUTINE W REFLEX MICROSCOPIC    EKG EKG Interpretation  Date/Time:  Monday January 13 2018 10:52:48 EDT Ventricular Rate:  98 PR Interval:  176 QRS  Duration: 74 QT Interval:  336 QTC Calculation: 428 R Axis:   64 Text Interpretation:  Normal sinus rhythm Early repolarization Normal ECG Confirmed by Quintella Reichert [id number] on 01/13/2018 5:51:17 PM   Radiology Dg Chest 2 View  Result Date: 01/13/2018 CLINICAL DATA:  Shortness of breath, asthma, COPD, chest tightness EXAM: CHEST - 2 VIEW COMPARISON:  05/28/2017 FINDINGS: Heart is normal size. Prominent interstitial markings again noted in the lungs, similar to prior study, likely chronic interstitial changes. No confluent airspace opacities or effusions. No acute bony abnormality. IMPRESSION: Stable interstitial prominence, favor chronic interstitial lung disease. No active disease. Electronically Signed   By: Rolm Baptise M.D.   On: 01/13/2018 12:03    Procedures Procedures (including critical care time)  CRITICAL CARE Performed by: Stevie Kern Cornel Werber   Total critical care time: 35 minutes  Critical care time was exclusive of separately billable procedures and treating other patients.  Critical care was necessary to treat or prevent imminent or life-threatening deterioration.  Critical care was time spent personally by me on the following activities: development of treatment plan with patient and/or surrogate as well as nursing, discussions with consultants, evaluation of patient's response to treatment, examination of patient, obtaining history from patient or surrogate, ordering and performing treatments and interventions, ordering and review of laboratory studies, ordering and review of radiographic studies, pulse oximetry and re-evaluation of patient's condition.   Medications Ordered in ED Medications  albuterol (PROVENTIL,VENTOLIN) solution continuous neb (0 mg/hr Nebulization Stopped 01/13/18 2049)  albuterol (PROVENTIL) (2.5 MG/3ML) 0.083% nebulizer solution 5 mg (5 mg Nebulization Given 01/13/18 1134)  albuterol (PROVENTIL) (2.5 MG/3ML) 0.083% nebulizer solution 5 mg (5 mg  Nebulization Given 01/13/18 1457)  predniSONE (DELTASONE) tablet 60 mg (60 mg Oral Given 01/13/18 1517)  albuterol (PROVENTIL,VENTOLIN) solution continuous neb (10 mg/hr Nebulization Given 01/13/18 1916)  magnesium sulfate IVPB 2 g 50 mL (0 g Intravenous Stopped 01/13/18 2113)     Initial Impression / Assessment and Plan / ED Course  I have reviewed the triage vital signs and the nursing notes.  Pertinent labs & imaging results that were available during my care of the patient  were reviewed by me and considered in my medical decision making (see chart for details).     Final Clinical Impressions(s) / ED Diagnoses   Final diagnoses:  COPD exacerbation ([place])    Labs: CBC, BMP  Imaging: DG chest 2 view  Consults: Internal medicine teaching service  Therapeutics: Albuterol, ipratropium, magnesium, prednisone  Discharge Meds:   Assessment/Plan: 57 year old male presents today with likely COPD exacerbation.  Patient received 4 breathing treatments while here in the ED, after the third 1 he was still hypoxic with ambulation, no hypoxic episodes at rest.  Patient was frequently monitored throughout his stay with frequent assessments of pulmonary function given his acute nature.  Patient has diffuse wheezes, no focal findings of infection.  Patient care discussed with internal medicine teaching service who would admit the patient.    ED Discharge Orders    None       Francee Gentile 01/13/18 2140    Quintella Reichert, MD 01/13/18 2259

## 2018-01-13 NOTE — ED Notes (Signed)
No flu swabs in ED. Will call main lab

## 2018-01-13 NOTE — ED Triage Notes (Signed)
Pt reports asthma/COPD exacerbation, inspiratory/expiratory wheezing now. Has been having trouble since yesterday. Using inhalers, but it isn't working anymore. Pt is a x 4, 98% RA.

## 2018-01-13 NOTE — H&P (Signed)
Date: 01/13/2018               Patient Name:  Richard [name] MRN: [id number]  DOB: Oct 19, 1960 Age / Sex: 57 y.o., male   PCP: [name], DO         Medical Service: Internal Medicine Teaching Service         Attending Physician: Dr. Evette Doffing, Mallie Mussel, *    First Contact: Dr. Pearson Grippe Pager: [phone]  Second Contact: Dr. Ledell Noss Pager: [phone]       After Hours (After 5p/  First Contact Pager: [phone]  weekends / holidays): Second Contact Pager: [phone]   Chief Complaint: Dyspnea  History of Present Illness:  Richard [name] is a 57 yo with PMH of T2DM, CAD, HTN, COPD, asthma, and previous tobacco use who presents for evaluation of dyspnea. History obtained directly from the patient. Patient was in usual state of health until Friday evening when he first noticed some shortness of breath and difficulty breathing. He noticed these symptoms after running around his house while playing with his granddaughter. His shortness of breath was associated with increased chest tightness, stating that he feels as if a something had been laid across his chest and was squeezing across him, and intermittent dry, nonproductive cough. His symptoms worsen with deep inspiration and improve with use of albuterol and Dulera inhalers. Patient continued to have symptoms throughout the next day, but thought that they were getting better with use of his inhalers every 2 hours. He went to church on Sunday and noticed worsening SOB and increased cough while singing with his choir. His symptoms resolved with use of inhalers and rest. Patient reported worsening dyspnea after leaving church which did not seem to improve with use of inhalers. ON the morning of presentation the patient states he woke up gasping for air and with a chest tightness that was worse than what he experienced on previous days. His girlfriend encouraged him to come to ED for evaluation. He denies fevers, sick contacts, sputum  production, abdominal pain, diaphoresis, nausea/vomiting, lower extremity swelling, and change in bowel/bladder habits.  Upon arrival to the ED the patient was afebrile, non-tachycardic (90s), normotensive (120-130s/70s), and saturating >93% on room air but desaturated to 89% with ambulation. CBC revealed a WBC = 6.4 and Hgb 13.7. BMP significant for bicarbonate of 21, glucose of 197, and anion gap of 16. EKG showed normal sinus rhythm without acute changes to suggest ischemia and CXR showed chronic interstitial prominence that was previously observed on prior imaging, consistent with chronic interstitial lung disease. Patient was noted to be diffusely wheezing by ED physician and given continuous albuterol, prednisone 60 mg, and 2g IV magnesium. Patient continued to have dyspnea, so IMTS was called for admission.   Meds:  Current Meds  Medication Sig  . albuterol (PROAIR HFA) 108 (90 Base) MCG/ACT inhaler Inhale 2 puffs into the lungs every 4 (four) hours as needed for wheezing or shortness of breath.  . Artificial Tear Ointment (DRY EYES OP) Place 1-2 drops into both eyes every 6 (six) hours as needed (for dry eyes).  Marland Kitchen aspirin EC 81 MG EC tablet Take 1 tablet (81 mg total) by mouth daily.  Marland Kitchen atorvastatin (LIPITOR) 40 MG tablet Take 1 tablet (40 mg total) by mouth daily.  Marland Kitchen BREO ELLIPTA 100-25 MCG/INH AEPB Take 1 puff by mouth daily.  . diclofenac sodium (VOLTAREN) 1 % GEL Apply 2-4 g topically 4 (four) times daily. Apply 2g to  right shoulder, 4 grams to left knee.  . Dulaglutide (TRULICITY) 1.5 PP/2.9JJ SOPN Inject 1.5 mg into the skin once a week. (Patient taking differently: Inject 1.5 mg into the skin every Friday. )  . empagliflozin (JARDIANCE) 10 MG TABS tablet Take 10 mg by mouth daily.  Marland Kitchen latanoprost (XALATAN) 0.005 % ophthalmic solution Instill 1 drop into both eyes at bedtime  . levocetirizine (XYZAL) 5 MG tablet Take 1 tablet (5 mg total) by mouth every evening.  . metFORMIN  (GLUCOPHAGE) 1000 MG tablet Take 1 tablet (1,000 mg total) by mouth 2 (two) times daily with a meal.  . mometasone-formoterol (DULERA) 100-5 MCG/ACT AERO Inhale 2 puffs into the lungs 2 (two) times daily.  . Multiple Vitamins-Minerals (MULTIVITAMIN ADULT PO) Take 1 tablet by mouth daily.  . pregabalin (LYRICA) 100 MG capsule Take 1 capsule (100 mg total) by mouth 3 (three) times daily.  . sildenafil (VIAGRA) 100 MG tablet Take 1 tablet (100 mg total) by mouth as needed for erectile dysfunction.  . timolol (TIMOPTIC) 0.5 % ophthalmic solution Place 1 drop into both eyes 2 (two) times daily.   Allergies: Allergies as of 01/13/2018  . (No Known Allergies)   Past Medical History: Past Medical History:  Diagnosis Date  . Asthma   . Clostridium difficile colitis 09/09/2014  . COPD (chronic obstructive pulmonary disease) ([place])   . Diabetes mellitus without complication ([place])    diagnosed in 2013  . Essential hypertension   . Glaucoma    bilateral, surgery on left eye but not right  . Hyperlipidemia   . Neuromuscular disorder (HCC)    neuropathy in feet  . 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 Substance abuse ([place])    crack - recovered x 30 yrs   Past Surgical History: Past Surgical History:  Procedure Laterality Date  . APPENDECTOMY    . CARDIAC CATHETERIZATION N/A 09/26/2015   Procedure: Left Heart Cath and Coronary Angiography;  Surgeon: [name], MD;  Location: [place] [facility];  Service: Cardiovascular;  Laterality: N/A;  . left eye surgery Left    glaucoma  . SKIN GRAFT     Family History:  Family History  Problem Relation Age of Onset  . Hypertension Mother   . Colon cancer Neg Hx   . Colon polyps Neg Hx   . Esophageal cancer Neg Hx   . Rectal cancer Neg Hx   . Stomach cancer Neg Hx    Social History:  Patient lives alone, has girlfriend and granddaughter that live nearby and visit often. Currently in school working on GED. Patient previously smoked 1/2 PPD since age 79,  but recently quit smoking (about 2-3 months ago) with assistance of chantix. Patient denies alcohol use and illicit drug use.   Review of Systems: A complete ROS was negative except as per HPI.   Physical Exam: Blood pressure 123/74, pulse 92, temperature 98.2 F (36.8 C), temperature source Oral, resp. rate 19, SpO2 100 %.   Physical Exam  Constitutional: He appears well-developed and well-nourished. No distress.  HENT:  Mouth/Throat: Oropharynx is clear and moist. No oropharyngeal exudate.  Eyes: Pupils are equal, round, and reactive to light. Conjunctivae and EOM are normal.  Cardiovascular: Normal rate, regular rhythm and intact distal pulses. Exam reveals no friction rub.  No murmur heard. Respiratory:  Patient unable to speak in full sentences during exam. No accessory muscle use, nasal flaring, or signs of cyanosis. Diffuse inspiratory and expiratory wheezing throughout all lung fields with  decreased air movement throughout.   GI: Soft. Bowel sounds are normal. He exhibits no distension. There is no tenderness.  Musculoskeletal: He exhibits no edema (of bilateral lower extremities) or tenderness (of bilateral lower extremities).  Lymphadenopathy:    He has no cervical adenopathy.  Neurological:  Face strength and sensation intact bilaterally. Tongue midline. Gross motor and sensation to light touch of upper and lower extremities intact bilaterally.   Skin: Skin is warm and dry. No rash noted. He is not diaphoretic. No erythema.   EKG: personally reviewed my interpretation is sinus rhythm, regular rate,  J-point elevation in V3-6 that is unchanged from previous EKG on 02/2017. No other signs of ST elevation or TWI to suggest ischemia.   CXR: personally reviewed my interpretation is no hyperinflation or flattening of diaphragm. Increased interstitial markings without vascular congestion, stable from previous exams in 2016. No pleural effusions or acute opacities.   Assessment & Plan  by Problem: Active Problems:   Asthma exacerbation  Richard [name] is a 57 yo with PMH of T2DM, CAD, HTN, COPD, asthma, and previous tobacco use who presented for evaluation of ongoing dyspnea and was found to have clinical signs/symptoms consistent with asthma exacerbation. He was admitted to the internal medicine teaching service for management. The specific problems addressed during admission are as follows:  Asthma Exacerbation: Patient has reported history of asthma and COPD. PFT's performed in 2016 show moderate to severe airway obstruction which is reversible with bronchodilators which is most consistent with asthma rather than COPD, as COPD does not traditionally respond well to bronchodilators. Moderate to severe restrictive lung disease also observed on this exam. Patient presents with chest tightness, dry cough, and dypsnea which worsens with increased activity (playing with daughter, singing at church) and improves with use of bronchodilators. Patient denies history of allergies. ON initial evaluation diffuse inspiratory and expiratory wheezing were noted , with intermittent dry cough during deep inspiration on PE. Patient also has mild dyspnea while speaking during interview. Patient reports symptoms have improved with use of breathing treatments but is still not back to baseline. Patient denies sputum production, no recent sick contacts, no fevers - all of which would be expected in acute COPD exacerbation. This is further supported by absence of leukocytosis or other systemic signs of infection. Will treat for asthma exacerbation and have low threshold to add azithromycin for treatment of possible COPD exacerbation if patient develops fever and/or cough with sputum production.  -Admit to med-surg -Albuterol nebulizer q1 hour PRN, wean frequency as tolerated -Duonebs q4 hours -Prednisone 50 mg daily for 5 days total (s/p 1 dose in ED) -Home levocetirizine 5 mg daily  T5VV complicated by  diabetic neuropathy: Last A1C = 7.8% indicating moderate control of T2DM on home regimen of daily metformin 1000 mg BID, empagliflozin 10 mg daily, and dulaglutid 1.5 mg qWeekly. Patient's BG elevated to 197 on arrival, repeat CBG = 169. Since patient will be receiving steroids for treatment of asthma exacerbation as described above, will need to closely monitor glucose levels while inpatient.  -CBG monitoring TID with meals and qHS -SSI-R TID with meals and qHS -Home pregabalin 100 mg TID  HTN: Patient currently normotensive. Per chart review patient previously on lisinopril 10 mg daily, but self-discontinued that prior to last clinic visit in January 2019. He was normotensive after discontinuation of medications, so has remained on diet and lifestyle modifications for management. Consider restarting this medication if needed during admission.  -Continue to monitor   FEN/GI: -  Heart Healthy/Carb Modified diet with 2L volume restriction -No IVF, replace electrolytes as needed  VTE Prophylaxis: Lovenox daily Code Status: Full  Dispo: Admit patient to Observation with expected length of stay less than 2 midnights.  SignedThomasene Ripple, MD 01/13/2018, 11:10 PM  Pager: [phone]

## 2018-01-14 DIAGNOSIS — E785 Hyperlipidemia, unspecified: Secondary | ICD-10-CM | POA: Diagnosis present

## 2018-01-14 DIAGNOSIS — Z7984 Long term (current) use of oral hypoglycemic drugs: Secondary | ICD-10-CM | POA: Diagnosis not present

## 2018-01-14 DIAGNOSIS — F17211 Nicotine dependence, cigarettes, in remission: Secondary | ICD-10-CM | POA: Diagnosis not present

## 2018-01-14 DIAGNOSIS — J441 Chronic obstructive pulmonary disease with (acute) exacerbation: Secondary | ICD-10-CM | POA: Diagnosis present

## 2018-01-14 DIAGNOSIS — E1140 Type 2 diabetes mellitus with diabetic neuropathy, unspecified: Secondary | ICD-10-CM | POA: Diagnosis not present

## 2018-01-14 DIAGNOSIS — Z79899 Other long term (current) drug therapy: Secondary | ICD-10-CM

## 2018-01-14 DIAGNOSIS — Z7951 Long term (current) use of inhaled steroids: Secondary | ICD-10-CM | POA: Diagnosis not present

## 2018-01-14 DIAGNOSIS — Z87891 Personal history of nicotine dependence: Secondary | ICD-10-CM | POA: Diagnosis not present

## 2018-01-14 DIAGNOSIS — I2510 Atherosclerotic heart disease of native coronary artery without angina pectoris: Secondary | ICD-10-CM

## 2018-01-14 DIAGNOSIS — H409 Unspecified glaucoma: Secondary | ICD-10-CM | POA: Diagnosis present

## 2018-01-14 DIAGNOSIS — I10 Essential (primary) hypertension: Secondary | ICD-10-CM | POA: Diagnosis present

## 2018-01-14 DIAGNOSIS — J849 Interstitial pulmonary disease, unspecified: Secondary | ICD-10-CM | POA: Diagnosis not present

## 2018-01-14 DIAGNOSIS — J45901 Unspecified asthma with (acute) exacerbation: Secondary | ICD-10-CM | POA: Diagnosis not present

## 2018-01-14 DIAGNOSIS — Z7982 Long term (current) use of aspirin: Secondary | ICD-10-CM | POA: Diagnosis not present

## 2018-01-14 DIAGNOSIS — R062 Wheezing: Secondary | ICD-10-CM | POA: Diagnosis present

## 2018-01-14 DIAGNOSIS — Z8249 Family history of ischemic heart disease and other diseases of the circulatory system: Secondary | ICD-10-CM | POA: Diagnosis not present

## 2018-01-14 DIAGNOSIS — J9601 Acute respiratory failure with hypoxia: Secondary | ICD-10-CM | POA: Diagnosis not present

## 2018-01-14 DIAGNOSIS — J984 Other disorders of lung: Secondary | ICD-10-CM | POA: Diagnosis present

## 2018-01-14 LAB — HIV ANTIBODY (ROUTINE TESTING W REFLEX): HIV Screen 4th Generation wRfx: NONREACTIVE

## 2018-01-14 LAB — INFLUENZA PANEL BY PCR (TYPE A & B)
Influenza A By PCR: NEGATIVE
Influenza B By PCR: NEGATIVE

## 2018-01-14 LAB — BASIC METABOLIC PANEL
Anion gap: 11 (ref 5–15)
BUN: 16 mg/dL (ref 6–20)
CHLORIDE: 102 mmol/L (ref 101–111)
CO2: 23 mmol/L (ref 22–32)
CREATININE: 1 mg/dL (ref 0.61–1.24)
Calcium: 9.1 mg/dL (ref 8.9–10.3)
GFR calc Af Amer: >60 mL/min (ref >60)
GFR calc non Af Amer: >60 mL/min (ref >60)
Glucose, Bld: 159 mg/dL — ABNORMAL HIGH (ref 65–99)
Potassium: 4.7 mmol/L (ref 3.5–5.1)
Sodium: 136 mmol/L (ref 135–145)

## 2018-01-14 LAB — GLUCOSE, CAPILLARY
GLUCOSE-CAPILLARY: 186 mg/dL — AB (ref 65–99)
Glucose-Capillary: 137 mg/dL — ABNORMAL HIGH (ref 65–99)
Glucose-Capillary: 197 mg/dL — ABNORMAL HIGH (ref 65–99)
Glucose-Capillary: 204 mg/dL — ABNORMAL HIGH (ref 65–99)
Glucose-Capillary: 213 mg/dL — ABNORMAL HIGH (ref 65–99)

## 2018-01-14 LAB — CBC
HCT: 42.5 % (ref 39.0–52.0)
Hemoglobin: 13.6 g/dL (ref 13.0–17.0)
MCH: 26.6 pg (ref 26.0–34.0)
MCHC: 32 g/dL (ref 30.0–36.0)
MCV: 83 fL (ref 78.0–100.0)
PLATELETS: 298 10*3/uL (ref 150–400)
RBC: 5.12 MIL/uL (ref 4.22–5.81)
RDW: 13.3 % (ref 11.5–15.5)
WBC: 9.4 10*3/uL (ref 4.0–10.5)

## 2018-01-14 MED ORDER — ALBUTEROL SULFATE (2.5 MG/3ML) 0.083% IN NEBU
2.5000 mg | INHALATION_SOLUTION | RESPIRATORY_TRACT | Status: DC | PRN
  Administered 2018-01-14 – 2018-01-15 (×3): 2.5 mg via RESPIRATORY_TRACT
  Filled 2018-01-14 (×3): qty 3

## 2018-01-14 MED ORDER — METFORMIN HCL 500 MG PO TABS
1000.0000 mg | ORAL_TABLET | Freq: Two times a day (BID) | ORAL | Status: DC
  Administered 2018-01-14 – 2018-01-15 (×2): 1000 mg via ORAL
  Filled 2018-01-14 (×2): qty 2

## 2018-01-14 MED ORDER — MOMETASONE FURO-FORMOTEROL FUM 100-5 MCG/ACT IN AERO
2.0000 | INHALATION_SPRAY | Freq: Two times a day (BID) | RESPIRATORY_TRACT | Status: DC
Start: 2018-01-14 — End: 2018-01-15
  Administered 2018-01-14 – 2018-01-15 (×2): 2 via RESPIRATORY_TRACT
  Filled 2018-01-14: qty 8.8

## 2018-01-14 NOTE — Progress Notes (Signed)
Pt admitted from ED per stretcher, fully alert and oriented, introduced to self and pt care equipment, fall risk assessment done been encourage to call for help when needed, has rashes on both arm, vital  Signs stable, prescribed treatment stated call light and phone within reach pt able to demonstrate how to use them, will continue to monitor pt

## 2018-01-14 NOTE — Progress Notes (Signed)
Nutrition Brief Note  RD Consulted to see patient for COPD Gold protocol  Wt Readings from Last 15 Encounters:  01/14/18 214 lb 15.2 oz (97.5 kg)  01/09/18 219 lb 6.4 oz (99.5 kg)  10/10/17 227 lb 12.8 oz (103.3 kg)  05/16/17 215 lb (97.5 kg)  04/18/17 208 lb (94.3 kg)  03/06/17 215 lb 12.8 oz (97.9 kg)  01/17/17 214 lb 4.8 oz (97.2 kg)  10/18/16 227 lb 4.8 oz (103.1 kg)  06/28/16 219 lb 12.8 oz (99.7 kg)  05/16/16 217 lb 14.4 oz (98.8 kg)  05/04/16 221 lb 11.2 oz (100.6 kg)  04/12/16 218 lb (98.9 kg)  03/26/16 218 lb 6.4 oz (99.1 kg)  02/23/16 219 lb 6.4 oz (99.5 kg)  01/24/16 220 lb (99.8 kg)   Declines weight loss or decreased appetite. Per chart appears weight fluctuates. No current needs  Body mass index is 26.87 kg/m. Patient meets criteria for overweight based on current BMI.   Current diet order is carb modified, patient is consuming approximately 100% of meals at this time. Labs and medications reviewed.   No nutrition interventions warranted at this time. If nutrition issues arise, please consult RD.   Satira Anis. Becka Lagasse, MS, RD LDN Inpatient Clinical Dietitian Pager [phone]

## 2018-01-14 NOTE — Progress Notes (Signed)
   Subjective: Richard [name] was seen resting in his bed on rounds. He states he is feeling better, but continues to get short of breath after longer conversations. He also continues to endorse dry cough, wheezing, and pain with deep inspiration. Reports a history consistent with season allergies. He has no other complaints or concerns today.  Objective:  Vital signs in last 24 hours: Vitals:   01/13/18 2342 01/13/18 2351 01/14/18 0409 01/14/18 0536  BP: (!) 144/82   115/82  Pulse: 98 (!) 101 95 91  Resp: (!) 22 (!) 22 20 18   Temp: 98.1 F (36.7 C)   98.6 F (37 C)  TempSrc: Oral   Oral  SpO2: 97% 90% 95% 96%   Physical Exam  Constitutional: He is oriented to person, place, and time. He appears well-developed and well-nourished. No distress.  HENT:  Head: Normocephalic and atraumatic.  Cardiovascular: Normal rate, regular rhythm, normal heart sounds and intact distal pulses.  Pulmonary/Chest: He has no wheezes.  Mildly increased work of breathing  Abdominal: Soft. Bowel sounds are normal. He exhibits no distension. There is no tenderness.  Musculoskeletal: He exhibits no edema or deformity.  Neurological: He is alert and oriented to person, place, and time.  Skin: Skin is warm and dry.   Assessment/Plan: 57 yo with PMH of T2DM, CAD, HTN, COPD, asthma, and previous tobacco use who presented for evaluation of ongoing dyspnea and was found to have clinical signs/symptoms consistent with asthma exacerbation.  Asthma Exacerbation: Hx of asthma and COPD. PFTs (2016): mod-severe obstruction, improved with bronchodilators consistent with asthma. Also shown to have Mod-severe restrictive lung disease. Flu Negative. - Chest tightness, dry cough, mild SOB persists today; Inspiratory and expiratory wheezing on exam.  - Albuterol nebs q3h PRN - Prednisone 50 mg daily (Day 2 of 5) - Home levocetirizine 5 mg daily - Restart home Dulera  Q3ES complicated by diabetic neuropathy: Last A1C = 7.8%.   On metformin 1000mg  BID, Empagliflozin 10mg  Daily, and Dulaglutid 1.5mg  qWeekly. BG 160-190s. On Prednisone here as above. - Last Dulaglutid on Friday - Continue home Metformin - CBG monitoring TID with meals and qHS - SSI-R TID with meals and qHS - Home pregabalin 100 mg TID  Hx of HTN: Patient currently normotensive. Previously on lisinopril 10 mg Daily. Normotensive after he discontinued this.  -Continue to monitor   FENI: Regular Diet VTE Prophylaxis: Lovenox Code Status: Full  Dispo: Anticipated discharge in approximately 1-2 day(s).   [name], MD 01/14/2018, 6:51 AM Pager: [phone]

## 2018-01-15 ENCOUNTER — Telehealth: Payer: Self-pay | Admitting: Internal Medicine

## 2018-01-15 LAB — GLUCOSE, CAPILLARY
GLUCOSE-CAPILLARY: 170 mg/dL — AB (ref 65–99)
GLUCOSE-CAPILLARY: 245 mg/dL — AB (ref 65–99)

## 2018-01-15 MED ORDER — FLUTICASONE FUROATE-VILANTEROL 100-25 MCG/INH IN AEPB: 1.0000 | INHALATION_SPRAY | Freq: Every day | RESPIRATORY_TRACT | Status: DC

## 2018-01-15 MED ORDER — PREDNISONE 50 MG PO TABS: 50.0000 mg | ORAL_TABLET | Freq: Every day | ORAL | 0 refills | Status: DC

## 2018-01-15 MED ORDER — PNEUMOCOCCAL VAC POLYVALENT 25 MCG/0.5ML IJ INJ: 0.5000 mL | INJECTION | INTRAMUSCULAR | Status: DC

## 2018-01-15 NOTE — Progress Notes (Signed)
SATURATION QUALIFICATIONS: (This note is used to comply with regulatory documentation for home oxygen)  Patient Saturations on Room Air at Rest = 95%  Patient Saturations on Room Air while Ambulating = 93%  Patient Saturations on0 Liters of oxygen while Ambulating = 0%  Please briefly explain why patient needs home oxygen: 

## 2018-01-15 NOTE — Telephone Encounter (Signed)
Hospital f/u per Dr Trilby Drummer; patient appt 04/17 115pm/NW

## 2018-01-15 NOTE — Progress Notes (Signed)
   Subjective: Richard [name] on rounds this morning. He states he continues to get short of breath and have some chest tightness with long conversations, but states he feels much better today. He endorses continued cough (worse at night). He has no other complaints or concerns today and is looking forward to going home when he is able.  Objective:  Vital signs in last 24 hours: Vitals:   01/14/18 2209 01/15/18 0627 01/15/18 0657 01/15/18 0823  BP: 132/82  124/77   Pulse: 92  97   Resp: 18  20   Temp:   98.1 F (36.7 C)   TempSrc:   Oral   SpO2: 98% 91% 97% 96%  Weight:       Physical Exam  Constitutional: He is oriented to person, place, and time. He appears well-developed and well-nourished. No distress.  HENT:  Head: Normocephalic and atraumatic.  Cardiovascular: Normal rate, regular rhythm, normal heart sounds and intact distal pulses.  Pulmonary/Chest: He has wheezes.  Normal work of breathing, breath sounds improved  Abdominal: Soft. Bowel sounds are normal. He exhibits no distension. There is no tenderness.  Musculoskeletal: He exhibits no edema or deformity.  Neurological: He is alert and oriented to person, place, and time.  Skin: Skin is warm and dry.   Assessment/Plan: 57 yo with PMH of T2DM, CAD, HTN, COPD, asthma, and previous tobacco use who presented for evaluation of ongoing dyspnea and was found to have clinical signs/symptoms consistent with asthma exacerbation.  Asthma Exacerbation: Hx of asthma and COPD. PFTs (2016): mod-severe obstruction, improved with bronchodilators consistent with asthma. Also shown to have Mod-severe restrictive lung disease. Flu Negative. - Improving. If able to maintain saturation with ambulation, will be able to complete recovery at home. - Albuterol nebs q3h PRN - Prednisone 50 mg daily (Day 3 of 5) - Home levocetirizine 5 mg daily - Continue Home Dulera - Ambulate with pulse ox  M5HQ complicated by diabetic neuropathy: Stable. Last  A1C = 7.8%.  On metformin 1000mg  BID, Empagliflozin 10mg  Daily, and Dulaglutid 1.5mg  qWeekly. BG 160-190s. On Prednisone here as above. - Last Dulaglutid on Friday - Continue home Metformin - CBG monitoring TID with meals and qHS - SSI-R TID with meals and qHS - Home pregabalin 100 mg TID  FENI: Regular Diet VTE Prophylaxis: Lovenox Code Status: Full  Dispo: Anticipated discharge 0-1 day(s).   Richard Seat, MD 01/15/2018, 10:52 AM Pager: [phone]

## 2018-01-15 NOTE — Progress Notes (Signed)
Patient was discharged home by MD order; discharged instructions review and give to patient with care notes; IV DIC; patient will be escorted to the car by a volunteer via wheelchair.  

## 2018-01-15 NOTE — Progress Notes (Signed)
Patient ambulating in the hallway with no help, approximately 200 feet. On ambulation room air his Sat O2 93-95%. No complaints of SOB. Will continue to monitor.

## 2018-01-15 NOTE — Consult Note (Signed)
            [facility] CM Primary Care Navigator  01/15/2018  Richard [name] 23-Apr-1961 [id number]  Went toseepatient at the bedside to identify possible discharge needsbuthe dischargedhome today per staff report.  Per MD note, patient was seen and treated for asthma exacerbation.  Providers from patient's primary care provider's officewere following him up during this hospitalization.  Patient has discharge instruction to follow-up with primary care provider on 01/22/18.   For additional questions please contact:  Edwena Felty A. Zamya Culhane, BSN, RN-BC [facility] PRIMARY CARE Navigator Cell: [phone]

## 2018-01-15 NOTE — Discharge Summary (Signed)
Name: Richard [name] MRN: [id number] DOB: January 08, 1961 57 y.o. PCP: [name], DO  Date of Admission: 01/13/2018 10:56 AM Date of Discharge: 01/15/2018 Attending Physician: Axel Filler, *  Discharge Diagnosis:  Active Problems:   Asthma exacerbation   Discharge Medications: Allergies as of 01/15/2018   No Known Allergies     Medication List    STOP taking these medications   BREO ELLIPTA 100-25 MCG/INH Aepb Generic drug:  fluticasone furoate-vilanterol     TAKE these medications   ACCU-CHEK FASTCLIX LANCETS Misc Check blood sugar two times a day   ACCU-CHEK GUIDE w/Device Kit 1 each by Does not apply route 2 (two) times daily.   PROAIR HFA 108 (90 Base) MCG/ACT inhaler Generic drug:  albuterol Inhale 2 puffs into the lungs every 4 (four) hours as needed for wheezing or shortness of breath.   albuterol 108 (90 Base) MCG/ACT inhaler Commonly known as:  PROVENTIL HFA;VENTOLIN HFA Inhale 2 puffs into the lungs every 4 (four) hours as needed for wheezing or shortness of breath.   aspirin 81 MG EC tablet Take 1 tablet (81 mg total) by mouth daily.   atorvastatin 40 MG tablet Commonly known as:  LIPITOR Take 1 tablet (40 mg total) by mouth daily.   diclofenac sodium 1 % Gel Commonly known as:  VOLTAREN Apply 2-4 g topically 4 (four) times daily. Apply 2g to right shoulder, 4 grams to left knee.   DRY EYES OP Place 1-2 drops into both eyes every 6 (six) hours as needed (for dry eyes).   Dulaglutide 1.5 MG/0.5ML Sopn Commonly known as:  TRULICITY Inject 1.5 mg into the skin once a week. What changed:  when to take this   empagliflozin 10 MG Tabs tablet Commonly known as:  JARDIANCE Take 10 mg by mouth daily.   glucose blood test strip Commonly known as:  ACCU-CHEK GUIDE Check blood sugar two times a day   latanoprost 0.005 % ophthalmic solution Commonly known as:  XALATAN Instill 1 drop into both eyes at bedtime   levocetirizine 5 MG  tablet Commonly known as:  XYZAL Take 1 tablet (5 mg total) by mouth every evening.   metFORMIN 1000 MG tablet Commonly known as:  GLUCOPHAGE Take 1 tablet (1,000 mg total) by mouth 2 (two) times daily with a meal.   mometasone-formoterol 100-5 MCG/ACT Aero Commonly known as:  DULERA Inhale 2 puffs into the lungs 2 (two) times daily.   MULTIVITAMIN ADULT PO Take 1 tablet by mouth daily.   predniSONE 50 MG tablet Commonly known as:  DELTASONE Take 1 tablet (50 mg total) by mouth daily with breakfast for 2 days.   pregabalin 100 MG capsule Commonly known as:  LYRICA Take 1 capsule (100 mg total) by mouth 3 (three) times daily.   sildenafil 100 MG tablet Commonly known as:  VIAGRA Take 1 tablet (100 mg total) by mouth as needed for erectile dysfunction.   timolol 0.5 % ophthalmic solution Commonly known as:  TIMOPTIC Place 1 drop into both eyes 2 (two) times daily.       Disposition and follow-up:   Mr.Damion Gelpi was discharged from [facility] in Stable condition.  At the hospital follow up visit please address:  Asthma Exacerbation: - Evaluate for recurrence of symptoms - Dulera and Breo both prescribed on admission, appears to be duplication of therapy - Breo discontinued on discharge, ensure medication list is up to date - Discuss options for management of possible seasonal  allergies  2.  Labs / imaging needed at time of follow-up: None  3.  Pending labs/ test needing follow-up: None  Follow-up Appointments:   Hospital Course by problem list: Active Problems:   Asthma exacerbation  Asthma Exacerbation: Patient presented with 2-3 days of shortness of breath and chest tightness in the setting of a history of asthma. He was treated for asthma exacerbation with albuterol nebulizations, Prednisone, and home Dulera. He improved on this therapy over the next couple of days. On day of discharge patient's lungs sounded much better (with mild  residual wheezing) and her maintained O2 Saturations of 93% on Room Air with Ambulation. He had apparently duplication of therapy with Ruthe Mannan and Memory Dance Memory Dance was discontinued on discharge). He was also discharged on Prednisone '50mg'$  Day for 2 additional days.  Discharge Vitals:   BP 124/77 (BP Location: Right Arm)   Pulse 97   Temp 98.1 F (36.7 C) (Oral)   Resp 20   Wt 214 lb 15.2 oz (97.5 kg)   SpO2 96%   BMI 26.87 kg/m   Pertinent Labs, Studies, and Procedures:  CBC Latest Ref Rng & Units 01/14/2018 01/13/2018 09/13/2017  WBC 4.0 - 10.5 K/uL 9.4 6.4 5.4  Hemoglobin 13.0 - 17.0 g/dL 13.6 13.7 13.7  Hematocrit 39.0 - 52.0 % 42.5 42.8 42.2  Platelets 150 - 400 K/uL 298 278 303   BMP Latest Ref Rng & Units 01/14/2018 01/13/2018 05/28/2017  Glucose 65 - 99 mg/dL 159(H) 197(H) 165(H)  BUN 6 - 20 mg/dL '16 16 17  '$ Creatinine 0.61 - 1.24 mg/dL 1.00 0.98 0.99  BUN/Creat Ratio 9 - 20 - - -  Sodium 135 - 145 mmol/L 136 137 137  Potassium 3.5 - 5.1 mmol/L 4.7 4.4 4.8  Chloride 101 - 111 mmol/L 102 100(L) 105  CO2 22 - 32 mmol/L 23 21(L) 24  Calcium 8.9 - 10.3 mg/dL 9.1 9.4 9.2   Admission EKG  EKG Interpretation  Date/Time:  Monday January 13 2018 10:52:48 EDT Ventricular Rate:  98 PR Interval:  176 QRS Duration: 74 QT Interval:  336 QTC Calculation: 428 R Axis:   64 Text Interpretation:  Normal sinus rhythm Early repolarization Normal ECG Confirmed by Quintella Reichert [phone]) on 01/13/2018 5:51:17 PM      CXR: IMPRESSION: Stable interstitial prominence, favor chronic interstitial lung disease. No active disease.  Discharge Instructions: Discharge Instructions    Call MD for:  persistant dizziness or light-headedness   Complete by:  As directed    Call MD for:  persistant nausea and vomiting   Complete by:  As directed    Call MD for:  severe uncontrolled pain   Complete by:  As directed    Diet - low sodium heart healthy   Complete by:  As directed    Discharge instructions   Complete  by:  As directed    Thank you for allowing [place] to care for you  Your symptoms were due to an Asthma exacerbation: - Continue to take your home inhalers and medications - We have prescribed 2 additional days of Prednisone for you - Continue to take [facility] and Stop taking Breo as this medications are very similar and you only need to be taking one  Please follow up in the [place] on April 17th @ 1:15   Increase activity slowly   Complete by:  As directed       Signed: [name], MD 01/15/2018, 11:39 AM   Pager: [phone]

## 2018-01-16 ENCOUNTER — Ambulatory Visit: Payer: Medicare Other

## 2018-01-16 ENCOUNTER — Other Ambulatory Visit: Payer: Self-pay

## 2018-01-16 ENCOUNTER — Observation Stay (HOSPITAL_COMMUNITY)
Admission: EM | Admit: 2018-01-16 | Discharge: 2018-01-17 | Disposition: A | Discharge status: Home / Self Care | Payer: Medicare Other | Attending: Student in an Organized Health Care Education/Training Program | Admitting: Student in an Organized Health Care Education/Training Program

## 2018-01-16 ENCOUNTER — Encounter (HOSPITAL_COMMUNITY): Payer: Self-pay | Admitting: Emergency Medicine

## 2018-01-16 ENCOUNTER — Emergency Department (HOSPITAL_COMMUNITY): Payer: Medicare Other

## 2018-01-16 DIAGNOSIS — I2510 Atherosclerotic heart disease of native coronary artery without angina pectoris: Secondary | ICD-10-CM | POA: Insufficient documentation

## 2018-01-16 DIAGNOSIS — I10 Essential (primary) hypertension: Secondary | ICD-10-CM | POA: Insufficient documentation

## 2018-01-16 DIAGNOSIS — E1140 Type 2 diabetes mellitus with diabetic neuropathy, unspecified: Secondary | ICD-10-CM | POA: Insufficient documentation

## 2018-01-16 DIAGNOSIS — E785 Hyperlipidemia, unspecified: Secondary | ICD-10-CM | POA: Diagnosis not present

## 2018-01-16 DIAGNOSIS — J45901 Unspecified asthma with (acute) exacerbation: Secondary | ICD-10-CM

## 2018-01-16 DIAGNOSIS — J441 Chronic obstructive pulmonary disease with (acute) exacerbation: Secondary | ICD-10-CM | POA: Diagnosis not present

## 2018-01-16 DIAGNOSIS — E1151 Type 2 diabetes mellitus with diabetic peripheral angiopathy without gangrene: Secondary | ICD-10-CM | POA: Diagnosis not present

## 2018-01-16 DIAGNOSIS — Z7984 Long term (current) use of oral hypoglycemic drugs: Secondary | ICD-10-CM | POA: Diagnosis not present

## 2018-01-16 DIAGNOSIS — R0602 Shortness of breath: Secondary | ICD-10-CM | POA: Diagnosis not present

## 2018-01-16 DIAGNOSIS — H409 Unspecified glaucoma: Secondary | ICD-10-CM | POA: Insufficient documentation

## 2018-01-16 DIAGNOSIS — R011 Cardiac murmur, unspecified: Secondary | ICD-10-CM | POA: Diagnosis not present

## 2018-01-16 DIAGNOSIS — Z7982 Long term (current) use of aspirin: Secondary | ICD-10-CM | POA: Diagnosis not present

## 2018-01-16 DIAGNOSIS — Z87891 Personal history of nicotine dependence: Secondary | ICD-10-CM | POA: Insufficient documentation

## 2018-01-16 DIAGNOSIS — R069 Unspecified abnormalities of breathing: Secondary | ICD-10-CM | POA: Diagnosis not present

## 2018-01-16 DIAGNOSIS — R05 Cough: Secondary | ICD-10-CM | POA: Diagnosis not present

## 2018-01-16 LAB — CBC WITH DIFFERENTIAL/PLATELET
Basophils Absolute: 0 10*3/uL (ref 0.0–0.1)
Basophils Relative: 0 %
EOS ABS: 0 10*3/uL (ref 0.0–0.7)
Eosinophils Relative: 0 %
HEMATOCRIT: 42.8 % (ref 39.0–52.0)
HEMOGLOBIN: 13.8 g/dL (ref 13.0–17.0)
LYMPHS ABS: 0.5 10*3/uL — AB (ref 0.7–4.0)
Lymphocytes Relative: 7 %
MCH: 26.9 pg (ref 26.0–34.0)
MCHC: 32.2 g/dL (ref 30.0–36.0)
MCV: 83.4 fL (ref 78.0–100.0)
MONO ABS: 0.2 10*3/uL (ref 0.1–1.0)
MONOS PCT: 2 %
NEUTROS ABS: 6.6 10*3/uL (ref 1.7–7.7)
NEUTROS PCT: 91 %
Platelets: 284 10*3/uL (ref 150–400)
RBC: 5.13 MIL/uL (ref 4.22–5.81)
RDW: 13.6 % (ref 11.5–15.5)
WBC: 7.3 10*3/uL (ref 4.0–10.5)

## 2018-01-16 LAB — BASIC METABOLIC PANEL
ANION GAP: 12 (ref 5–15)
ANION GAP: 12 (ref 5–15)
BUN: 22 mg/dL — ABNORMAL HIGH (ref 6–20)
BUN: 21 mg/dL — ABNORMAL HIGH (ref 6–20)
CALCIUM: 9.1 mg/dL (ref 8.9–10.3)
CHLORIDE: 101 mmol/L (ref 101–111)
CHLORIDE: 101 mmol/L (ref 101–111)
CO2: 21 mmol/L — AB (ref 22–32)
CO2: 20 mmol/L — AB (ref 22–32)
Calcium: 9.3 mg/dL (ref 8.9–10.3)
Creatinine, Ser: 0.93 mg/dL (ref 0.61–1.24)
Creatinine, Ser: 0.95 mg/dL (ref 0.61–1.24)
GFR calc non Af Amer: >60 mL/min (ref >60)
GFR calc non Af Amer: >60 mL/min (ref >60)
GLUCOSE: 381 mg/dL — AB (ref 65–99)
Glucose, Bld: 364 mg/dL — ABNORMAL HIGH (ref 65–99)
POTASSIUM: 6 mmol/L — AB (ref 3.5–5.1)
POTASSIUM: 5 mmol/L (ref 3.5–5.1)
Sodium: 134 mmol/L — ABNORMAL LOW (ref 135–145)
Sodium: 133 mmol/L — ABNORMAL LOW (ref 135–145)

## 2018-01-16 LAB — GLUCOSE, CAPILLARY: Glucose-Capillary: 400 mg/dL — ABNORMAL HIGH (ref 65–99)

## 2018-01-16 LAB — BRAIN NATRIURETIC PEPTIDE: B Natriuretic Peptide: 34.5 pg/mL (ref 0.0–100.0)

## 2018-01-16 LAB — I-STAT TROPONIN, ED: Troponin i, poc: 0 ng/mL (ref 0.00–0.08)

## 2018-01-16 MED ORDER — DICLOFENAC SODIUM 1 % TD GEL
2.0000 g | Freq: Four times a day (QID) | TRANSDERMAL | Status: DC
  Administered 2018-01-17: 2 g via TOPICAL
  Filled 2018-01-16: qty 100

## 2018-01-16 MED ORDER — ALBUTEROL (5 MG/ML) CONTINUOUS INHALATION SOLN
10.0000 mg/h | INHALATION_SOLUTION | Freq: Once | RESPIRATORY_TRACT | Status: AC
Start: 2018-01-16 — End: 2018-01-16
  Administered 2018-01-16: 10 mg/h via RESPIRATORY_TRACT
  Filled 2018-01-16: qty 20

## 2018-01-16 MED ORDER — TIMOLOL MALEATE 0.5 % OP SOLN
1.0000 [drp] | Freq: Two times a day (BID) | OPHTHALMIC | Status: DC
  Administered 2018-01-16 – 2018-01-17 (×2): 1 [drp] via OPHTHALMIC
  Filled 2018-01-16: qty 5

## 2018-01-16 MED ORDER — LORATADINE 10 MG PO TABS
10.0000 mg | ORAL_TABLET | Freq: Every day | ORAL | Status: DC
Start: 2018-01-16 — End: 2018-01-17
  Administered 2018-01-16 – 2018-01-17 (×2): 10 mg via ORAL
  Filled 2018-01-16 (×2): qty 1

## 2018-01-16 MED ORDER — INSULIN ASPART 100 UNIT/ML ~~LOC~~ SOLN
0.0000 [IU] | Freq: Three times a day (TID) | SUBCUTANEOUS | Status: DC
  Administered 2018-01-17: 3 [IU] via SUBCUTANEOUS
  Administered 2018-01-17: 5 [IU] via SUBCUTANEOUS

## 2018-01-16 MED ORDER — IPRATROPIUM-ALBUTEROL 0.5-2.5 (3) MG/3ML IN SOLN
3.0000 mL | Freq: Four times a day (QID) | RESPIRATORY_TRACT | Status: DC
  Administered 2018-01-16 – 2018-01-17 (×3): 3 mL via RESPIRATORY_TRACT
  Filled 2018-01-16 (×4): qty 3

## 2018-01-16 MED ORDER — LEVOCETIRIZINE DIHYDROCHLORIDE 5 MG PO TABS: 5.0000 mg | ORAL_TABLET | Freq: Every evening | ORAL | Status: DC

## 2018-01-16 MED ORDER — LATANOPROST 0.005 % OP SOLN
1.0000 [drp] | Freq: Every day | OPHTHALMIC | Status: DC
  Administered 2018-01-16: 1 [drp] via OPHTHALMIC
  Filled 2018-01-16: qty 2.5

## 2018-01-16 MED ORDER — SODIUM CHLORIDE 0.9 % IV SOLN
1.0000 g | Freq: Once | INTRAVENOUS | Status: AC
  Administered 2018-01-16: 1 g via INTRAVENOUS
  Filled 2018-01-16: qty 10

## 2018-01-16 MED ORDER — INSULIN GLARGINE 100 UNIT/ML ~~LOC~~ SOLN
10.0000 [IU] | Freq: Every day | SUBCUTANEOUS | Status: DC
  Administered 2018-01-16: 10 [IU] via SUBCUTANEOUS
  Filled 2018-01-16 (×2): qty 0.1

## 2018-01-16 MED ORDER — ENOXAPARIN SODIUM 40 MG/0.4ML ~~LOC~~ SOLN
40.0000 mg | SUBCUTANEOUS | Status: DC
  Administered 2018-01-16: 40 mg via SUBCUTANEOUS
  Filled 2018-01-16: qty 0.4

## 2018-01-16 MED ORDER — METHYLPREDNISOLONE SODIUM SUCC 125 MG IJ SOLR
125.0000 mg | Freq: Once | INTRAMUSCULAR | Status: AC
  Administered 2018-01-16: 125 mg via INTRAVENOUS
  Filled 2018-01-16: qty 2

## 2018-01-16 MED ORDER — ATORVASTATIN CALCIUM 40 MG PO TABS
40.0000 mg | ORAL_TABLET | Freq: Every day | ORAL | Status: DC
  Administered 2018-01-17: 40 mg via ORAL
  Filled 2018-01-16: qty 1

## 2018-01-16 MED ORDER — ASPIRIN 81 MG PO CHEW
81.0000 mg | CHEWABLE_TABLET | Freq: Every day | ORAL | Status: DC
  Administered 2018-01-16 – 2018-01-17 (×2): 81 mg via ORAL
  Filled 2018-01-16 (×2): qty 1

## 2018-01-16 MED ORDER — INSULIN ASPART 100 UNIT/ML ~~LOC~~ SOLN
15.0000 [IU] | Freq: Once | SUBCUTANEOUS | Status: AC
  Administered 2018-01-16: 15 [IU] via SUBCUTANEOUS

## 2018-01-16 MED ORDER — PREGABALIN 100 MG PO CAPS
100.0000 mg | ORAL_CAPSULE | Freq: Three times a day (TID) | ORAL | Status: DC
  Administered 2018-01-16 – 2018-01-17 (×2): 100 mg via ORAL
  Filled 2018-01-16 (×2): qty 1

## 2018-01-16 NOTE — ED Notes (Signed)
Calcium Gluconate stopped per admitting MD due to repeat Potassium resulting 5.0

## 2018-01-16 NOTE — H&P (Addendum)
Date: 01/16/2018               Patient Name:  Richard [name] MRN: [id number]  DOB: August 12, 1961 Age / Sex: 57 y.o., male   PCP: [name], DO         Medical Service: Internal Medicine Teaching Service         Attending Physician: Dr. Annia Belt, MD    First Contact: Dr. Pearson Grippe Pager: [phone]  Second Contact: Dr. Ledell Noss Pager: [phone]       After Hours (After 5p/  First Contact Pager: [phone]  weekends / holidays): Second Contact Pager: [phone]   Chief Complaint: SOB  History of Present Illness: 57 yo male with PMH of COPD, Asthma, T2DM, tobacco use disorder, CAD.  He presents to the ED with shortness of breath that began upon waking up in the morning.  He reports feeling his chest was very tight and that he was wheezing.  He endorses a productive cough with clear to whitish sputum.  He denies any fevers or chills.  He has associated pleuritic chest pain worsened by cough.  He reports using his albuterol inhaler without relief.  He started using his new Dulera inhaler and taking his prednisone.  He has had no vomiting/nausea or diarrhea.    He was admitted to our service for 2 days for asthma exacerbation that was causing significant dyspnea with minimal exertion.  He improved significantly with systemic steroids and frequent nebulized bronchodilators.  We discharged him to home on Tuesday.  He reports doing very well that day and Wednesday.  He returned to his normal functional status.  He was outside for a large amount of time on Wednesday.  On Thursday morning he says that he woke up and felt a return of significant wheezing.  He was very short of breath, often short of breath with just talking.  He tried his albuterol inhaler at home but with minimal relief.  He reports good compliance with prednisone, and his other inhalers.  Meds:  Current Meds  Medication Sig  . albuterol (PROAIR HFA) 108 (90 Base) MCG/ACT inhaler Inhale 2 puffs into the lungs every 4  (four) hours as needed for wheezing or shortness of breath.  Marland Kitchen albuterol (PROVENTIL HFA;VENTOLIN HFA) 108 (90 Base) MCG/ACT inhaler Inhale 2 puffs into the lungs every 4 (four) hours as needed for wheezing or shortness of breath.  . Artificial Tear Ointment (DRY EYES OP) Place 1-2 drops into both eyes every 6 (six) hours as needed (for dry eyes).  Marland Kitchen aspirin EC 81 MG EC tablet Take 1 tablet (81 mg total) by mouth daily.  Marland Kitchen atorvastatin (LIPITOR) 40 MG tablet Take 1 tablet (40 mg total) by mouth daily.  . diclofenac sodium (VOLTAREN) 1 % GEL Apply 2-4 g topically 4 (four) times daily. Apply 2g to right shoulder, 4 grams to left knee.  . Dulaglutide (TRULICITY) 1.5 OI/7.1IW SOPN Inject 1.5 mg into the skin once a week. (Patient taking differently: Inject 1.5 mg into the skin every Friday. )  . empagliflozin (JARDIANCE) 10 MG TABS tablet Take 10 mg by mouth daily.  Marland Kitchen latanoprost (XALATAN) 0.005 % ophthalmic solution Instill 1 drop into both eyes at bedtime  . levocetirizine (XYZAL) 5 MG tablet Take 1 tablet (5 mg total) by mouth every evening.  . metFORMIN (GLUCOPHAGE) 1000 MG tablet Take 1 tablet (1,000 mg total) by mouth 2 (two) times daily with a meal.  . mometasone-formoterol (DULERA) 100-5  MCG/ACT AERO Inhale 2 puffs into the lungs 2 (two) times daily.  . Multiple Vitamins-Minerals (MULTIVITAMIN ADULT PO) Take 1 tablet by mouth daily.  . predniSONE (DELTASONE) 50 MG tablet Take 1 tablet (50 mg total) by mouth daily with breakfast for 2 days.  . pregabalin (LYRICA) 100 MG capsule Take 1 capsule (100 mg total) by mouth 3 (three) times daily.  . sildenafil (VIAGRA) 100 MG tablet Take 1 tablet (100 mg total) by mouth as needed for erectile dysfunction.  . timolol (TIMOPTIC) 0.5 % ophthalmic solution Place 1 drop into both eyes 2 (two) times daily.     Allergies: Allergies as of 01/16/2018  . (No Known Allergies)   Past Medical History:  Diagnosis Date  . Asthma   . Clostridium difficile  colitis 09/09/2014  . COPD (chronic obstructive pulmonary disease) ([place])   . Diabetes mellitus without complication ([place])    diagnosed in 2013  . Essential hypertension   . Glaucoma    bilateral, surgery on left eye but not right  . Hyperlipidemia   . Neuromuscular disorder ([facility])    neuropathy in feet  . 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 Hypertension Mother   . Colon cancer Neg Hx   . Colon polyps Neg Hx   . Esophageal cancer Neg Hx   . Rectal cancer Neg Hx   . Stomach cancer Neg Hx     Social History:  Social History   Socioeconomic History  . Marital status: Single  . Smoking status: Former Smoker    Packs/day: 1.00    Types: Cigarettes    Last attempt to quit: 08/13/2016    Years since quitting: 1.4  . Smokeless tobacco: Former Systems developer    Quit date: 08/24/2016  . Tobacco comment: 5-6 CIAGARETTES A DAY   Quit smoking a few months ago per pt using chantix No alcohol or illicit drug use   Review of Systems: A complete ROS was negative except as per HPI.   Physical Exam: Blood pressure (!) 143/86, pulse 95, temperature 98.7 F (37.1 C), temperature source Oral, resp. rate (!) 26, height 6\' 3"  (1.905 m), weight 215 lb (97.5 kg), SpO2 96 %. Physical Exam  Constitutional: He appears well-developed and well-nourished.  Eyes: Right eye exhibits no discharge. Left eye exhibits no discharge. No scleral icterus.  Cardiovascular: Normal rate, regular rhythm and intact distal pulses. Exam reveals no gallop and no friction rub.  Murmur heard.  Systolic murmur is present with a grade of 2/6. Best heard in mitral area  Pulmonary/Chest: Effort normal. No respiratory distress. He has wheezes (faint wheezing in bilateral lower lung fields, worsens as you move superiorly). He has no rales.  Abdominal: Soft. Bowel sounds are normal. He exhibits no distension and no mass. There  is no tenderness. There is no guarding.  Neurological: He is alert.  Ext: warm and well perfused, no edema Psych:  Normal mood and affect, pleasant and polite, well put together, making his bed this morning.  EKG: personally reviewed my interpretation is sinus tach, LAE, NER   CXR: personally reviewed my interpretation is no active cardiopulmonary disease  Assessment & Plan by Problem: Active Problems:   Asthma exacerbation  57 year old man with reactive airway disease and restrictive lung disease is readmitted because of persistent dyspnea with exertion and wheezing,  currently doing well.  Asthma Exacerbation: This is a continuation of his respiratory illness from the prior admission a few days ago. Overall, he is still improved from his initial presentation.  I think this is related to the very severe environmental pollen that has erupted over the last 1 week in [place].  He reports being outside for a long time on Wednesday and then his symptoms quickly began shortly after that exposure.  I am encouraged by his exam today, there is minimal wheezing and he has had no oxygen requirement this admission.  I think we can make a few changes to our original discharge plan to try to give him some more support at home. - Continue with prednisone and instead of stopping after a 5-day burst, we will do a 2-week slow taper. - We will order him a nebulizer machine so he can use nebulized albuterol as needed for severe wheezing at home - Start Singulair - Duo nebs every 4 hours while awake -  ambulate with pulse oximetry - Continue home bronchodilators.   T2DM: Last a1c 7.8% April 4th 2019 -Started on Lantus 10 units daily -continue lyrica 100mg  TID   Dispo: Admit patient to Observation with expected length of stay less than 2 midnights.  Signed: Katherine Roan, MD 01/16/2018, 6:14 PM     Internal Medicine Attending:   I saw and examined the patient. I reviewed the resident's  note and I agree with the resident's findings and plan as documented in the resident's note.

## 2018-01-16 NOTE — ED Provider Notes (Signed)
[facility] EMERGENCY DEPARTMENT Provider Note   CSN: [id number] Arrival date & time: 01/16/18  1047     History   Chief Complaint Chief Complaint  Patient presents with  . COPD    HPI Yousuf Ager is a 57 y.o. male.  HPI   57 year old male with history of COPD, asthma, diabetes, hypertension, hyperlipidemia, prior history of substance abuse presenting for evaluation of shortness of breath.  Patient develop shortness of breath and wheezing, congestion and cough with intermittent chest tightness last week.  Symptoms did not improved with his home medication.  He was seen in the ED on 01/13/18 for his condition.  Despite receiving multiple nebulizer treatment he was subsequently admitted to the hospital for COPD exacerbation.  During his hospitalization, patient received additional breathing treatment as well as steroid with improvement.  He was subsequently discharged home yesterday with a prescription for p.o. steroid.  This morning he got up, after walking a short distance he became short of breath, wheezing, shortness of breath, felt dizzy with chest tightness.  He has been coughing up white and clear sputum.  He returns again for further management.  He did receive breathing treatment via EMS and did report some improvement of his symptoms.  Patient states he is currently on a blood thinner medication.  However, I do not see one on his medical list.  Patient currently denies fever, hemoptysis, leg swelling, low back pain.    Past Medical History:  Diagnosis Date  . Asthma   . Clostridium difficile colitis 09/09/2014  . COPD (chronic obstructive pulmonary disease) ([place])   . Diabetes mellitus without complication ([place])    diagnosed in 2013  . Essential hypertension   . Glaucoma    bilateral, surgery on left eye but not right  . Hyperlipidemia   . Neuromuscular disorder (H[facility])    neuropathy in feet  . 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 Substance abuse ([place])    crack -  recovered x 30 yrs    Patient Active Problem List   Diagnosis Date Noted  . Asthma exacerbation 01/13/2018  . Bursitis of right shoulder 01/09/2018  . Osteoarthritis of left knee 01/09/2018  . Peripheral arterial disease ([place]) 10/21/2017  . 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 Erectile dysfunction 03/06/2017  . Abdominal aortic atherosclerosis ([place]) 01/18/2017  . Vitamin D deficiency 07/02/2016  . Tubular adenoma of colon 02/06/2016  . Diabetic neuropathy, painful ([place]) 11/30/2015  . Overweight (BMI 25.0-29.9) 11/30/2015  . CAD in native artery 10/20/2015  . Tobacco abuse   . Essential hypertension   . 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 COPD with asthma [facility])     Past Surgical History:  Procedure Laterality Date  . APPENDECTOMY    . CARDIAC CATHETERIZATION N/A 09/26/2015   Procedure: Left Heart Cath and Coronary Angiography;  Surgeon: [name], MD;  Location: [facility];  Service: Cardiovascular;  Laterality: N/A;  . left eye surgery Left    glaucoma  . SKIN GRAFT          Home Medications    Prior to Admission medications   Medication Sig Start Date End Date Taking? Authorizing Provider  ACCU-CHEK FASTCLIX LANCETS MISC Check blood sugar two times a day 05/09/17   [name], DO  albuterol (PROAIR HFA) 108 (90 Base) MCG/ACT inhaler Inhale 2 puffs into the lungs every  4 (four) hours as needed for wheezing or shortness of breath.    [provider]  albuterol (PROVENTIL HFA;VENTOLIN HFA) 108 (90 Base) MCG/ACT inhaler Inhale 2 puffs into the lungs every 4 (four) hours as needed for wheezing or shortness of breath. Patient not taking: Reported on 01/13/2018 12/12/17   [name], DO  Artificial Tear Ointment (DRY EYES OP) Place 1-2 drops into both eyes every 6 (six) hours as needed (for dry eyes).    [provider]   aspirin EC 81 MG EC tablet Take 1 tablet (81 mg total) by mouth daily. 09/26/15   Burns, Arloa Koh, MD  atorvastatin (LIPITOR) 40 MG tablet Take 1 tablet (40 mg total) by mouth daily. 10/10/17 10/10/18  [name], DO  Blood Glucose Monitoring Suppl (ACCU-CHEK GUIDE) w/Device KIT 1 each by Does not apply route 2 (two) times daily. 11/21/16   [name], DO  diclofenac sodium (VOLTAREN) 1 % GEL Apply 2-4 g topically 4 (four) times daily. Apply 2g to right shoulder, 4 grams to left knee. 01/09/18   [name], DO  Dulaglutide (TRULICITY) 1.5 IA/1.6PV SOPN Inject 1.5 mg into the skin once a week. Patient taking differently: Inject 1.5 mg into the skin every Friday.  01/09/18   [name], DO  empagliflozin (JARDIANCE) 10 MG TABS tablet Take 10 mg by mouth daily. 10/10/17   [name], DO  glucose blood (ACCU-CHEK GUIDE) test strip Check blood sugar two times a day 05/09/17   [name], DO  latanoprost (XALATAN) 0.005 % ophthalmic solution Instill 1 drop into both eyes at bedtime 10/21/15   [provider]  levocetirizine (XYZAL) 5 MG tablet Take 1 tablet (5 mg total) by mouth every evening. 01/09/18   [name], DO  metFORMIN (GLUCOPHAGE) 1000 MG tablet Take 1 tablet (1,000 mg total) by mouth 2 (two) times daily with a meal. 10/10/17   Hoffman, Rachel Moulds, DO  mometasone-formoterol (DULERA) 100-5 MCG/ACT AERO Inhale 2 puffs into the lungs 2 (two) times daily. 10/10/17   [name], DO  Multiple Vitamins-Minerals (MULTIVITAMIN ADULT PO) Take 1 tablet by mouth daily. 05/20/17   [provider]  predniSONE (DELTASONE) 50 MG tablet Take 1 tablet (50 mg total) by mouth daily with breakfast for 2 days. 01/15/18 01/17/18  [name], MD  pregabalin (LYRICA) 100 MG capsule Take 1 capsule (100 mg total) by mouth 3 (three) times daily. 10/10/17   [name], DO  sildenafil (VIAGRA) 100 MG tablet Take 1 tablet (100 mg total) by mouth as needed for erectile dysfunction.  10/10/17 10/10/18  [name], DO  timolol (TIMOPTIC) 0.5 % ophthalmic solution Place 1 drop into both eyes 2 (two) times daily. 01/10/18   [provider]    Family History Family History  Problem Relation Age of Onset  . Hypertension Mother   . Colon cancer Neg Hx   . Colon polyps Neg Hx   . Esophageal cancer Neg Hx   . Rectal cancer Neg Hx   . Stomach cancer Neg Hx     Social History Social History   Tobacco Use  . Smoking status: Former Smoker    Packs/day: 1.00    Types: Cigarettes    Last attempt to quit: 08/13/2016    Years since quitting: 1.4  . Smokeless tobacco: Former Systems developer    Quit date: 08/24/2016  . Tobacco comment: 5-6 CIAGARETTES A DAY  Substance Use Topics  .  Alcohol use: No    Alcohol/week: 0.0 oz  . Drug use: No     Allergies   Patient has no known allergies.   Review of Systems Review of Systems  All other systems reviewed and are negative.    Physical Exam Updated Vital Signs BP 129/72 (BP Location: Right Arm)   Pulse 98   Temp 98.7 F (37.1 C) (Oral)   Resp 13   Ht _0  (1.905 m)   Wt 97.5 kg (215 lb)   SpO2 97%   BMI 26.87 kg/m   Physical Exam  Constitutional: He is oriented to person, place, and time. He appears well-developed and well-nourished. No distress.  Patient is ill-looking  HENT:  Head: Atraumatic.  Edentulous  Eyes: Conjunctivae are normal.  Neck: Neck supple. No JVD present.  Cardiovascular: Normal rate and regular rhythm.  Murmur heard. Pulmonary/Chest:  Decreased breath sounds with faint wheezes heard  Abdominal: Soft. Bowel sounds are normal. He exhibits no distension. There is no tenderness.  Musculoskeletal: He exhibits no edema.  Neurological: He is alert and oriented to person, place, and time.  Skin: No rash noted.  Psychiatric: He has a normal mood and affect.  Nursing note and vitals reviewed.    ED Treatments / Results  Labs (all labs ordered are listed, but only abnormal results are  displayed) Labs Reviewed  CBC WITH DIFFERENTIAL/PLATELET - Abnormal; Notable for the following components:      Result Value   Lymphs Abs 0.5 (*)    All other components within normal limits  BRAIN NATRIURETIC PEPTIDE  BASIC METABOLIC PANEL  I-STAT TROPONIN, ED    EKG None ED ECG REPORT   Date: 01/16/2018  Rate: 100  Rhythm: sinus tachycardia  QRS Axis: normal  Intervals: normal  ST/T Wave abnormalities: nonspecific ST changes  Conduction Disutrbances:none  Narrative Interpretation:   Old EKG Reviewed: unchanged  I have personally reviewed the EKG tracing and agree with the computerized printout as noted.    Radiology Dg Chest 2 View  Result Date: 01/16/2018 CLINICAL DATA:  sob, cp. Pt stated that he's been in the hospital since Monday 01/13/18 for the same problem and just got out yesterday 01/15/18. Pt is having pain in his middle chest, he's had a productive cough with clear phlegm for the same amount of time, and he said he feels very tire. Pt was experiencing pain in his middle chest during exam. Hx: COPD, DM2, essential HTN, substance abuse, cardiac cath left side 2016, former smoker of 1 pack/day quit in 2017 EXAM: CHEST - 2 VIEW COMPARISON:  01/13/2018 FINDINGS: Cardiac silhouette is normal in size. No mediastinal or hilar masses. No evidence of adenopathy. Prominent bronchovascular markings, stable. Lungs otherwise clear. No pleural effusion or pneumothorax. Skeletal structures are unremarkable. IMPRESSION: No active cardiopulmonary disease. Electronically Signed   By: Lajean Manes M.D.   On: 01/16/2018 14:46    Procedures Procedures (including critical care time)  Medications Ordered in ED Medications  albuterol (PROVENTIL,VENTOLIN) solution continuous neb (10 mg/hr Nebulization Given 01/16/18 1246)  methylPREDNISolone sodium succinate (SOLU-MEDROL) 125 mg/2 mL injection 125 mg (125 mg Intravenous Given 01/16/18 1233)     Initial Impression / Assessment and Plan /  ED Course  I have reviewed the triage vital signs and the nursing notes.  Pertinent labs & imaging results that were available during my care of the patient were reviewed by me and considered in my medical decision making (see chart for details).  BP 126/75   Pulse 93   Temp 98.7 F (37.1 C) (Oral)   Resp 17   Ht _0  (1.905 m)   Wt 97.5 kg (215 lb)   SpO2 93%   BMI 26.87 kg/m    Final Clinical Impressions(s) / ED Diagnoses   Final diagnoses:  COPD exacerbation [facility])    ED Discharge Orders    None     12:11 PM Patient recently admitted to the hospital for shortness of breath.  He was hospitalized for approximately 2 days for asthma/COPD exacerbation and subsequently discharged yesterday.  He is here again today with worsening shortness of breath while at home.  He does have decreased breath sounds and wheezes on exam consistent with a COPD exacerbation.  He denies cocaine use, drug or alcohol use.  Workup initiated, patient will be started on a continuous nebulizer as well as receiving steroid, Solu-Medrol via IV.  3:35 PM Patient report some improvement with continued nebs and steroid.  However, he does not feel comfortable going home.  He is concerned that his shortness of breath will worsen at home and request to be admitted.  No hypoxia during this ER visit.  Chest x-ray without concerning feature,   No active chest pain at this time.  Potassium is 6.  It was 4.7 two days ago.  This could be due to hemolysis.  Will recheck sample. EKG shows diffuse ST elevation suggestive of pericarditis.  CBG is 381, normal anion gap.  Normal BNP.    3:49 PM Appreciate consultation from Internal Medicine resident who agrees to see pt in the ER and likely admit for further management of his COPD exacerbation.  At this time, we have not treat pt's hyperkalemia as this may be a lab error.  I have order a repeat BMP to recheck his CBG and K+.  He will likely need treatment if he continues  to be hyperkalemic.     Domenic Moras, PA-C 01/16/18 1551    Sherwood Gambler, MD 01/17/18 [phone]

## 2018-01-16 NOTE — ED Notes (Signed)
Patient transported to X-ray 

## 2018-01-16 NOTE — ED Notes (Signed)
Ambulated pt on room air.  Pt's oxygen saturation fluctuated between 99 and 89%.

## 2018-01-16 NOTE — ED Triage Notes (Signed)
Per EMS: Pt c/o COPD exacerbation, audible wheezing, and diaphoretic.  Pt also reporting CP.   Pt recently d/c from [facility].  Pt is A/O x4 and 97% on RA.

## 2018-01-16 NOTE — ED Notes (Signed)
Attempted report x1. 

## 2018-01-16 NOTE — ED Notes (Signed)
Admitting at bedside 

## 2018-01-17 DIAGNOSIS — I2510 Atherosclerotic heart disease of native coronary artery without angina pectoris: Secondary | ICD-10-CM | POA: Diagnosis not present

## 2018-01-17 DIAGNOSIS — Z79899 Other long term (current) drug therapy: Secondary | ICD-10-CM | POA: Diagnosis not present

## 2018-01-17 DIAGNOSIS — E119 Type 2 diabetes mellitus without complications: Secondary | ICD-10-CM | POA: Diagnosis not present

## 2018-01-17 DIAGNOSIS — R011 Cardiac murmur, unspecified: Secondary | ICD-10-CM

## 2018-01-17 DIAGNOSIS — J449 Chronic obstructive pulmonary disease, unspecified: Secondary | ICD-10-CM | POA: Diagnosis not present

## 2018-01-17 DIAGNOSIS — J45901 Unspecified asthma with (acute) exacerbation: Secondary | ICD-10-CM | POA: Diagnosis not present

## 2018-01-17 DIAGNOSIS — F17211 Nicotine dependence, cigarettes, in remission: Secondary | ICD-10-CM

## 2018-01-17 DIAGNOSIS — Z794 Long term (current) use of insulin: Secondary | ICD-10-CM | POA: Diagnosis not present

## 2018-01-17 DIAGNOSIS — Z7952 Long term (current) use of systemic steroids: Secondary | ICD-10-CM | POA: Diagnosis not present

## 2018-01-17 DIAGNOSIS — Z7951 Long term (current) use of inhaled steroids: Secondary | ICD-10-CM

## 2018-01-17 LAB — GLUCOSE, CAPILLARY
GLUCOSE-CAPILLARY: 150 mg/dL — AB (ref 65–99)
Glucose-Capillary: 152 mg/dL — ABNORMAL HIGH (ref 65–99)

## 2018-01-17 MED ORDER — BENZONATATE 100 MG PO CAPS
100.0000 mg | ORAL_CAPSULE | Freq: Two times a day (BID) | ORAL | Status: DC | PRN
  Administered 2018-01-17: 100 mg via ORAL
  Filled 2018-01-17: qty 1

## 2018-01-17 MED ORDER — MOMETASONE FURO-FORMOTEROL FUM 200-5 MCG/ACT IN AERO: 2.0000 | INHALATION_SPRAY | Freq: Two times a day (BID) | RESPIRATORY_TRACT | 3 refills | Status: DC

## 2018-01-17 MED ORDER — ALBUTEROL SULFATE (2.5 MG/3ML) 0.083% IN NEBU
2.5000 mg | INHALATION_SOLUTION | RESPIRATORY_TRACT | Status: DC | PRN
  Administered 2018-01-17: 2.5 mg via RESPIRATORY_TRACT
  Filled 2018-01-17 (×2): qty 3

## 2018-01-17 MED ORDER — ALBUTEROL SULFATE (2.5 MG/3ML) 0.083% IN NEBU
2.5000 mg | INHALATION_SOLUTION | Freq: Four times a day (QID) | RESPIRATORY_TRACT | Status: DC | PRN
  Administered 2018-01-17: 2.5 mg via RESPIRATORY_TRACT
  Filled 2018-01-17: qty 3

## 2018-01-17 MED ORDER — PREDNISONE 20 MG PO TABS: ORAL_TABLET | ORAL | 0 refills | Status: AC

## 2018-01-17 MED ORDER — MOMETASONE FURO-FORMOTEROL FUM 100-5 MCG/ACT IN AERO
2.0000 | INHALATION_SPRAY | Freq: Two times a day (BID) | RESPIRATORY_TRACT | Status: DC
  Administered 2018-01-17: 2 via RESPIRATORY_TRACT
  Filled 2018-01-17: qty 8.8

## 2018-01-17 MED ORDER — ALBUTEROL SULFATE (2.5 MG/3ML) 0.083% IN NEBU: 2.5000 mg | INHALATION_SOLUTION | Freq: Four times a day (QID) | RESPIRATORY_TRACT | 11 refills | Status: DC | PRN

## 2018-01-17 MED ORDER — PREDNISONE 50 MG PO TABS
50.0000 mg | ORAL_TABLET | Freq: Every day | ORAL | Status: DC
  Administered 2018-01-17: 50 mg via ORAL
  Filled 2018-01-17: qty 1

## 2018-01-17 NOTE — Discharge Summary (Signed)
Name: Richard [name] MRN: [id number] DOB: 02/17/1961 57 y.o. PCP: [name], DO  Date of Admission: 01/16/2018 10:47 AM Date of Discharge: 01/17/2018 Attending Physician: Axel Filler, *  Discharge Diagnosis:  Active Problems:   Asthma exacerbation   Discharge Medications: Allergies as of 01/17/2018   No Known Allergies     Medication List    STOP taking these medications   mometasone-formoterol 100-5 MCG/ACT Aero Commonly known as:  DULERA Replaced by:  mometasone-formoterol 200-5 MCG/ACT Aero     TAKE these medications   ACCU-CHEK FASTCLIX LANCETS Misc Check blood sugar two times a day   ACCU-CHEK GUIDE w/Device Kit 1 each by Does not apply route 2 (two) times daily.   PROAIR HFA 108 (90 Base) MCG/ACT inhaler Generic drug:  albuterol Inhale 2 puffs into the lungs every 4 (four) hours as needed for wheezing or shortness of breath. What changed:  Another medication with the same name was added. Make sure you understand how and when to take each.   albuterol 108 (90 Base) MCG/ACT inhaler Commonly known as:  PROVENTIL HFA;VENTOLIN HFA Inhale 2 puffs into the lungs every 4 (four) hours as needed for wheezing or shortness of breath. What changed:  Another medication with the same name was added. Make sure you understand how and when to take each.   albuterol (2.5 MG/3ML) 0.083% nebulizer solution Commonly known as:  PROVENTIL Take 3 mLs (2.5 mg total) by nebulization every 6 (six) hours as needed for wheezing or shortness of breath. What changed:  You were already taking a medication with the same name, and this prescription was added. Make sure you understand how and when to take each.   aspirin 81 MG EC tablet Take 1 tablet (81 mg total) by mouth daily.   atorvastatin 40 MG tablet Commonly known as:  LIPITOR Take 1 tablet (40 mg total) by mouth daily.   diclofenac sodium 1 % Gel Commonly known as:  VOLTAREN Apply 2-4 g topically 4 (four) times  daily. Apply 2g to right shoulder, 4 grams to left knee.   DRY EYES OP Place 1-2 drops into both eyes every 6 (six) hours as needed (for dry eyes).   Dulaglutide 1.5 MG/0.5ML Sopn Commonly known as:  TRULICITY Inject 1.5 mg into the skin once a week. What changed:  when to take this   empagliflozin 10 MG Tabs tablet Commonly known as:  JARDIANCE Take 10 mg by mouth daily.   glucose blood test strip Commonly known as:  ACCU-CHEK GUIDE Check blood sugar two times a day   latanoprost 0.005 % ophthalmic solution Commonly known as:  XALATAN Instill 1 drop into both eyes at bedtime   levocetirizine 5 MG tablet Commonly known as:  XYZAL Take 1 tablet (5 mg total) by mouth every evening.   metFORMIN 1000 MG tablet Commonly known as:  GLUCOPHAGE Take 1 tablet (1,000 mg total) by mouth 2 (two) times daily with a meal.   mometasone-formoterol 200-5 MCG/ACT Aero Commonly known as:  DULERA Inhale 2 puffs into the lungs 2 (two) times daily. Replaces:  mometasone-formoterol 100-5 MCG/ACT Aero   MULTIVITAMIN ADULT PO Take 1 tablet by mouth daily.   predniSONE 20 MG tablet Commonly known as:  DELTASONE Take 2 tablets (40 mg total) by mouth daily with breakfast for 4 days, THEN 1 tablet (20 mg total) daily with breakfast for 4 days. Start taking on:  01/17/2018 What changed:    medication strength  See the new instructions.  pregabalin 100 MG capsule Commonly known as:  LYRICA Take 1 capsule (100 mg total) by mouth 3 (three) times daily.   sildenafil 100 MG tablet Commonly known as:  VIAGRA Take 1 tablet (100 mg total) by mouth as needed for erectile dysfunction.   timolol 0.5 % ophthalmic solution Commonly known as:  TIMOPTIC Place 1 drop into both eyes 2 (two) times daily.            Durable Medical Equipment  (From admission, onward)        Start     Ordered   01/17/18 0725  For home use only DME Nebulizer machine  Once    Question Answer Comment  Patient  needs a nebulizer to treat with the following condition Asthma attack   Patient needs a nebulizer to treat with the following condition Asthma in adult, moderate persistent, with acute exacerbation      01/17/18 0725      Disposition and follow-up:   [name] was discharged from [facility] in Stable condition.  At the hospital follow up visit please address:  Asthma Exacerbation - Ensure resolution of symptoms - Ensure patient able to obtain and is taking new prescripition >> Dulera increased to 200-5, 2 puffs BID >> Home Albuterol Nebs, PRN >> Prednisone taper ('40mg'$  x4d, '20mg'$  x4d) >> Resumption of Daily Xyzal - Ensure prepress with allergist referral (for evaluation of potential triggers)  2.  Labs / imaging needed at time of follow-up: None  3.  Pending labs/ test needing follow-up: None  Follow-up Appointments:   Hospital Course by problem list:    Asthma Exacerbation: Patient present presented with persistent symptoms of shortness of breath and chest tightness with activity follow recent discharge 2 days prior. He had been feeling betting on day of discharge, but after returning home and spending the day outside, he awoke the following morning with similar symptoms and was concerned as he felt his inhalers did not significantly improve his symptoms. He was treated with nebulizations and steroids and by the time of exam the following morning he was feeling much better and there was no wheezing present on exam. He recurrence of symptoms appear to be due to worse cough in the morning and exposure to the heavy pollen count present on the day he spent outdoors. With his recent exacerbation he was discharged with home nebulizer machine and prescription for albuterol nebs. His Dulera dose was also increased as he was showing sings that his asthma was not as well controlled on his current dose as it had been previously (this can be stepped down once better control  is achieved). He was advised to resume taking Xyzal and referred to an allergist (to evaluate for potential triggers) as he had been taking this previously and his exacerbations appear to be related in part to his allergies. He was discharged on a prednisone taper as a continuation of previous steroid therapy.  Discharge Vitals:   BP 135/83 (BP Location: Right Arm)   Pulse 84   Temp 97.7 F (36.5 C) (Oral)   Resp 20   Ht '6\' 3"'$  (1.905 m)   Wt 213 lb 3.2 oz (96.7 kg)   SpO2 93% Comment: Ambulating  BMI 26.65 kg/m   Pertinent Labs, Studies, and Procedures:  CBC Latest Ref Rng & Units 01/16/2018 01/14/2018 01/13/2018  WBC 4.0 - 10.5 K/uL 7.3 9.4 6.4  Hemoglobin 13.0 - 17.0 g/dL 13.8 13.6 13.7  Hematocrit 39.0 - 52.0 % 42.8 42.5  42.8  Platelets 150 - 400 K/uL 284 298 278   BMP Latest Ref Rng & Units 01/16/2018 01/16/2018 01/14/2018  Glucose 65 - 99 mg/dL 364(H) 381(H) 159(H)  BUN 6 - 20 mg/dL 21(H) 22(H) 16  Creatinine 0.61 - 1.24 mg/dL 0.95 0.93 1.00  BUN/Creat Ratio 9 - 20 - - -  Sodium 135 - 145 mmol/L 133(L) 134(L) 136  Potassium 3.5 - 5.1 mmol/L 5.0 6.0(H) 4.7  Chloride 101 - 111 mmol/L 101 101 102  CO2 22 - 32 mmol/L 20(L) 21(L) 23  Calcium 8.9 - 10.3 mg/dL 9.1 9.3 9.1   Admission EKG:  EKG Interpretation  Date/Time:  Thursday January 16 2018 11:01:58 EDT Ventricular Rate:  100 PR Interval:    QRS Duration: 84 QT Interval:  331 QTC Calculation: 427 R Axis:   34 Text Interpretation:  Sinus tachycardia Left atrial enlargement ST elevation suggests acute pericarditis No significant change was found Confirmed by Jola Schmidt [phone]) on 01/18/2018 1:55:26 PM       Discharge Instructions: Discharge Instructions    Ambulatory referral to Allergy   Complete by:  As directed    History of asthma, recent exacerbation, evaluating for triggers   Call MD for:  difficulty breathing, headache or visual disturbances   Complete by:  As directed    Call MD for:  persistant dizziness or  light-headedness   Complete by:  As directed    Call MD for:  persistant nausea and vomiting   Complete by:  As directed    Diet - low sodium heart healthy   Complete by:  As directed    Discharge instructions   Complete by:  As directed    Thank you for allwoing [place] tro care for you  Your symptoms were due to your asthma exacerbation - We have increased the dose of your home inhaler, please pick this up at the pharmacy - We have prescribed a Prednisone to be taken as '40mg'$  for 4 days, THEN '20mg'$  for 4 days - We have also provided you with a nebulizer machine and prescription for albuterol nebulizations to be used as needed - We have also given you a referral to an Allergist - Continue to take Xyzal Daily  Please follow up in the clinic on Wednesday 4/17 @ 2:45 pm   Increase activity slowly   Complete by:  As directed       Signed: [name], MD 01/17/2018, 2:34 PM   Pager: [phone]

## 2018-01-17 NOTE — Progress Notes (Signed)
Ambulate on room air 93%.

## 2018-01-17 NOTE — Progress Notes (Signed)
Nebulizer machine ordered as requested and to be delivered to the room prior to discharging home; Aneta Mins [phone]

## 2018-01-17 NOTE — Progress Notes (Signed)
   Subjective: Mr Rando was seen in his room this morning. He states he is feeling better today, but had a good bit of cough and chest tightness overnight (same as he has been having lately). He states his symtpoms reutrned while at home sitting out side and consisted of subjective wheezing and chest tightness as well as some coughing with clear sputum. He states he was concerned as he felt his home medications were not making he feel much better so he came to the emergency department. He states he would feel more comfortable if he had a nebulizer at home considering his recent exacerbations.  Objective:  Vital signs in last 24 hours: Vitals:   01/17/18 0154 01/17/18 0224 01/17/18 0443 01/17/18 0753  BP:   127/80   Pulse:   81   Resp:   20   Temp:   98.5 F (36.9 C)   TempSrc:   Oral   SpO2:  96% 93% 98%  Weight: 213 lb 3.2 oz (96.7 kg)     Height:       Physical Exam  Constitutional: He is oriented to person, place, and time. He appears well-developed and well-nourished. No distress.  Cardiovascular: Normal rate, regular rhythm, normal heart sounds and intact distal pulses.  Pulmonary/Chest: Effort normal and breath sounds normal. No respiratory distress.  Abdominal: Soft. Bowel sounds are normal. He exhibits no distension. There is no tenderness.  Musculoskeletal: He exhibits no edema or deformity.  Neurological: He is alert and oriented to person, place, and time.  Skin: Skin is warm and dry.   Assessment/Plan: 57 yo male with PMH of COPD, Asthma, T2DM, tobacco use disorder, CAD presented to the ED with shortness of breath starting when he woke up in the morning.  Asthma Exacerbation: Patient present to the ED on 4/11 after discharge on 4/10 for Asthma exacerbation. He states that he was feeling fine the first day he was at home but his symptoms worsened overnight and with his bad cough in the mornings he felt his inhalers were not making him feel sufficiently better so he returned  to the ED and was readmitted. - No wheezing on exam today, patient feels much better - Ambulate with pulse ox - 93% on Room Air - Prednisone 50mg  Daily - Albuterol Neb q4h PRN - Dulera BID - Claritin 10mg  Daily  Dispo: Anticipated discharge later today.   [name], MD 01/17/2018, 8:17 AM Pager: [phone]

## 2018-01-20 ENCOUNTER — Other Ambulatory Visit: Payer: Self-pay | Admitting: Pharmacist

## 2018-01-20 DIAGNOSIS — J449 Chronic obstructive pulmonary disease, unspecified: Secondary | ICD-10-CM

## 2018-01-21 ENCOUNTER — Telehealth (HOSPITAL_COMMUNITY): Payer: Self-pay

## 2018-01-21 ENCOUNTER — Ambulatory Visit (INDEPENDENT_AMBULATORY_CARE_PROVIDER_SITE_OTHER): Payer: Medicare Other | Admitting: Orthotics

## 2018-01-21 DIAGNOSIS — L97519 Non-pressure chronic ulcer of other part of right foot with unspecified severity: Secondary | ICD-10-CM

## 2018-01-21 DIAGNOSIS — L84 Corns and callosities: Secondary | ICD-10-CM | POA: Diagnosis not present

## 2018-01-21 DIAGNOSIS — L97502 Non-pressure chronic ulcer of other part of unspecified foot with fat layer exposed: Secondary | ICD-10-CM | POA: Diagnosis not present

## 2018-01-21 DIAGNOSIS — E11621 Type 2 diabetes mellitus with foot ulcer: Secondary | ICD-10-CM | POA: Diagnosis not present

## 2018-01-21 NOTE — Progress Notes (Signed)

## 2018-01-21 NOTE — Telephone Encounter (Signed)
Patient readmitted 4/11 and discharged 01/17/2018. Hubbard [place], RN, BSN

## 2018-01-21 NOTE — Telephone Encounter (Signed)
Transitions of Care Post-discharge Follow-Up Phone Call:  Date of Discharge: 01/17/2018 Principal Discharge Diagnosis(es): Asthma exacerbation Post-discharge Communication: Attempt #1 to reach patient and complete post-discharge follow-up phone call. Call placed to # [phone], unable to reach patient. HIPPA compliant voicemail left requesting return call. Call Completed: No      L. Silvano Rusk, RN, BSN

## 2018-01-21 NOTE — Telephone Encounter (Signed)
Referral received. PFT has been reviewed by RN. PFT FEV1/FVC post bronch ratio does not support DX of COPD Stage 2,3, or 4. FEV1/FVC ratio has to be <70. Will request new referral with new DX.

## 2018-01-22 ENCOUNTER — Other Ambulatory Visit: Payer: Self-pay

## 2018-01-22 ENCOUNTER — Ambulatory Visit: Payer: Medicare Other

## 2018-01-22 ENCOUNTER — Ambulatory Visit (INDEPENDENT_AMBULATORY_CARE_PROVIDER_SITE_OTHER): Payer: Medicare Other | Admitting: Internal Medicine

## 2018-01-22 ENCOUNTER — Encounter: Payer: Self-pay | Admitting: Internal Medicine

## 2018-01-22 VITALS — BP 127/65 | HR 97 | Temp 98.2°F | Ht 75.0 in | Wt 216.7 lb

## 2018-01-22 DIAGNOSIS — Z7951 Long term (current) use of inhaled steroids: Secondary | ICD-10-CM | POA: Diagnosis not present

## 2018-01-22 DIAGNOSIS — Z79899 Other long term (current) drug therapy: Secondary | ICD-10-CM

## 2018-01-22 DIAGNOSIS — Z7984 Long term (current) use of oral hypoglycemic drugs: Secondary | ICD-10-CM

## 2018-01-22 DIAGNOSIS — E119 Type 2 diabetes mellitus without complications: Secondary | ICD-10-CM | POA: Diagnosis not present

## 2018-01-22 DIAGNOSIS — F17201 Nicotine dependence, unspecified, in remission: Secondary | ICD-10-CM | POA: Diagnosis not present

## 2018-01-22 DIAGNOSIS — I10 Essential (primary) hypertension: Secondary | ICD-10-CM | POA: Diagnosis not present

## 2018-01-22 DIAGNOSIS — J45909 Unspecified asthma, uncomplicated: Secondary | ICD-10-CM

## 2018-01-22 DIAGNOSIS — J4540 Moderate persistent asthma, uncomplicated: Secondary | ICD-10-CM

## 2018-01-22 NOTE — Progress Notes (Signed)
   CC: hospital follow-up of asthma    HPI:  [name] is a 57 y.o. M with asthma since childhood (recent PFTs with moderate-severe airway obstruction, reversible with bronchodilators), diet-controlled essential HTN, type 2 diabetes on Metformin, and history of tobacco use who presents today for hospital follow-up. For details regarding today's visit and the status of their chronic medical issues, please refer to the assessment and plan, although brief hospital course is outlined below.  Brief Hospital Course  Mr. Kulpa was admitted 4/8-4/10 and again 4/11-4/12 with acute respiratory failure secondary to an asthma exacerbation. He was treated with prednisone, duonebs and and anti-histamines with improvement. He was discharged home but returned the following day with recurrent chest tightness and wheezing after working outside and not taking antihistamines. He was again treated with prednisone, nebulizer treatments and antihistamines with improvement. He was discharged home the following day with increased Dulera regimen, a prednisone taper, a nebulizer machine, recommendation to resume antihistamines and also referral to an allergist.   Past Medical History:  Diagnosis Date  . Asthma   . Clostridium difficile colitis 09/09/2014  . COPD (chronic obstructive pulmonary disease) ([place])   . Diabetes mellitus without complication ([place])    diagnosed in 2013  . Essential hypertension   . Glaucoma    bilateral, surgery on left eye but not right  . Hyperlipidemia   . Neuromuscular disorder (HCC)    neuropathy in feet  . 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 Substance abuse ([place])    crack - recovered x 30 yrs   Review of Systems:   General: Denies fevers, chills, weight loss HEENT: +post-nasal drip, sneezing, watery eyes. Denies acute changes in vision, dysphagia Cardiac: Denies chest pressure or pain. Admits to chest tightness Pulmonary: +non-productive cough. +wheezes. +occasional nocturnal awakenings     Physical Exam: General: Alert, in no acute distress. Pleasant and conversant. Expresses desire to reduce medication burden several times. HEENT: Normocephalic, atraumatic. No stridor. No hoarseness or dysarthria. No JVD. Cardiac: RRR, no MGR appreciated Pulmonary: Lungs clear without crackles or wheezes. Normal WOB on RA.  Abd: Soft, non-tender. +bs Psych: Normal mood and affect. Responded to questions appropriately.  Vitals:   01/22/18 1450  BP: 127/65  Pulse: 97  Temp: 98.2 F (36.8 C)  TempSrc: Oral  SpO2: 98%  Weight: 216 lb 11.2 oz (98.3 kg)  Height: 6\' 3"  (1.905 m)   Body mass index is 27.09 kg/m.  Assessment & Plan:   See Encounters Tab for problem based charting.  Patient discussed with Dr. Beryle Beams

## 2018-01-22 NOTE — Assessment & Plan Note (Addendum)
Assessment: Since discharge he's noted steady improvement of his symptoms, although he does note chest tightness and cough when siting or working outside. He has been taking Dulera 2 puffs twice daily, has been using prn albuterol 1-2 times daily but has not taken a medication for allergies as he was hoping to defer this until he sees the Allergist 5/17. He has not smoked in 10 days!  Plan: Richard [name] seemed to realize the connection between the high-pollen levels and his respiratory symptoms today. It took some convincing, but he agrees to restarting Cetirizine or Levo-cetirizine today. I suspect getting better control of his seasonal allergies will help with his respiratory symptoms.  -Continue Dulera 2 puffs twice daily -Continue prn albuterol -Pt to start antihistamine -Applauded and encouraged patient re: tobacco cessation -Has appointment with allergist -RTC 2 months for follow-up, but advised to return to clinic if any issues arise

## 2018-01-22 NOTE — Patient Instructions (Addendum)
It was great meeting you today. I'm sorry you are still having issues with your breathing.   You are most likely still having issues with your asthma due to untreated allergies. Unfortunately, allergies are notorious for worsening asthma and causing prolonged symptoms. It sounds like your symptoms do get worse after you've been outside as well.  I know you were referred to an allergist and would like to hold-off on allergy medications until your appointment in May, but I think given your persistent symptoms and risk of asthma flare, we should treat you. Please pick up a medication called Cetirizine from the pharmacy. This is the generic for Zyrtec. It will help with your allergies. Please try to avoid prolonged exposure outside while you are dealing with a flare of your asthma.   Please continue taking your Dulera 2 puffs twice daily and using your albuterol as needed. You are nearly done with your course of prednisone, please continue as directed.   I would like to see you back in clinic in about 2 months to see how you are doing and also see how your visit went with the specialist.

## 2018-01-22 NOTE — Telephone Encounter (Signed)
Pt seen today in [facility] for HFU.Richard Hidden Cassady4/17/20194:07 PM

## 2018-01-23 ENCOUNTER — Telehealth: Payer: Self-pay | Admitting: Dietician

## 2018-01-23 NOTE — Telephone Encounter (Signed)
Steroid-Induced Hyperglycemia Prevention and Management Richard [name] is a 57 y.o. male who meets criteria for [facility] glucose monitoring program (diabetes patient prescribed short course of steroids).  A/P Current Regimen  Patient prescribed prednisone 40 mg daily x 4 days, then 20 mg x 4 days, currently on day 6 of therapy. Patient taking prednisone in the AM  Prednisone indication: asthma/COPD  Current DM regimen trulicity, metformin,jardiance  Home BG Monitoring  Patient does have a meter at home and does check BG at home. Meter was not supplied.  CBGs at home 200-277  CBGs prior to steroid course 100s A1C prior to steroid course 7.8  S/Sx of hyper- or hypoglycemia: none, none  Medication Management  Switch prednisone dose to AM not applicable  Additional treatment for BG control is indicated at this time.  We decided to continue to monitor as he is not on a diabetes medicine that can be increased per the protocol and will stop prednisone in 2 days.  Physician preference level per protocol: 1  Patient Education  Advised patient to monitor BG while on steroid therapy (at least twice daily prior to first 2 meals of the day).  Patient educated about signs/symptoms and advised to contact clinic if hyper- or hypoglycemic.  Patient did  verbalize understanding of information and regimen by repeating back topics discussed.  [name] says he has not started the allergy medicine yet because his mail order pharmacy will not get it to him until next week. The pharmacist spoke to him and educated him about allergy medicine he can get at [facility] without a prescription for <$1.00  Follow-up as needed  2 months per the last doctor note  [name] 4:33 PM 01/23/2018

## 2018-01-23 NOTE — Progress Notes (Signed)
Medicine attending: Medical history, presenting problems, physical findings, and medications, reviewed with resident physician Dr Romelle Starcher Molt on the day of the patient visit and I concur with her evaluation and management plan. Unstable asthmatic w recent admission to hospital x 2. Plan as outlined by Dr Danford Bad.

## 2018-02-14 ENCOUNTER — Ambulatory Visit: Payer: Medicare Other | Admitting: Allergy

## 2018-02-14 DIAGNOSIS — Z125 Encounter for screening for malignant neoplasm of prostate: Secondary | ICD-10-CM | POA: Diagnosis not present

## 2018-02-16 DIAGNOSIS — J45901 Unspecified asthma with (acute) exacerbation: Secondary | ICD-10-CM | POA: Diagnosis not present

## 2018-02-17 ENCOUNTER — Ambulatory Visit (INDEPENDENT_AMBULATORY_CARE_PROVIDER_SITE_OTHER): Payer: Medicare Other | Admitting: Podiatry

## 2018-02-17 ENCOUNTER — Encounter: Payer: Self-pay | Admitting: Podiatry

## 2018-02-17 DIAGNOSIS — E11621 Type 2 diabetes mellitus with foot ulcer: Secondary | ICD-10-CM

## 2018-02-17 DIAGNOSIS — L84 Corns and callosities: Secondary | ICD-10-CM | POA: Diagnosis not present

## 2018-02-17 DIAGNOSIS — M79676 Pain in unspecified toe(s): Secondary | ICD-10-CM

## 2018-02-17 DIAGNOSIS — B351 Tinea unguium: Secondary | ICD-10-CM | POA: Diagnosis not present

## 2018-02-17 DIAGNOSIS — L97519 Non-pressure chronic ulcer of other part of right foot with unspecified severity: Secondary | ICD-10-CM

## 2018-02-17 DIAGNOSIS — L02611 Cutaneous abscess of right foot: Secondary | ICD-10-CM

## 2018-02-17 MED ORDER — CEPHALEXIN 500 MG PO CAPS: 500.0000 mg | ORAL_CAPSULE | Freq: Three times a day (TID) | ORAL | 0 refills | Status: DC

## 2018-02-17 NOTE — Progress Notes (Signed)
Subjective: 57 y.o. returns the office today for painful, elongated, thickened toenails which he cannot trim himself. Denies any redness or drainage around the nails.  He also has a callus on the bottom of his right foot he has noticed a small amount of bleeding.  He states that he has noticed the callus been getting thicker.  He has been wearing sandals and to put this new shoes that he has been wearing.  Denies any acute changes since last appointment and no new complaints today. Denies any systemic complaints such as fevers, chills, nausea, vomiting.   PCP: [name], DO Objective: AAO 3, NAD DP/PT pulses palpable, CRT less than 3 seconds Protective sensation decreased with Simms Weinstein monofilament Nails hypertrophic, dystrophic, elongated, brittle, discolored 10. There is tenderness overlying the nails 1-5 bilaterally. There is no surrounding erythema or drainage along the nail sites. Hyperkeratotic lesion right submetatarsal 3 area.  Upon debridement there is a small amount of superficial purulence.  There is no malodor present as there is macerated tissue underneath the hyperkeratotic lesion.  There is no definitive skin breakdown once I debrided all the hyperkeratotic tissue.  There is no surrounding erythema, ascending cellulitis.  There is no fluctuation or crepitation or any malodor. No open lesions or pre-ulcerative lesions are identified. No other areas of tenderness bilateral lower extremities. No overlying edema, erythema, increased warmth. No pain with calf compression, swelling, warmth, erythema.  Assessment: Patient presents with symptomatic onychomycosis; pre-ulcerative callus right foot  Plan: -Treatment options including alternatives, risks, complications were discussed -Nails sharply debrided 10 without complication/bleeding. -I debrided the right foot hyperkeratotic lesion.  There is a small amount of superficial purulence underneath the callus and there is  macerated tissue underneath this and there is no malodor.  Because of this we will continue Betadine dressing changes daily and also prescribed Keflex. Monitor for any clinical signs or symptoms of infection and directed to call the office immediately should any occur or go to the ER. -Discussed daily foot inspection. If there are any changes, to call the office immediately.  -Follow-up in 10 days to check the right foot or sooner if any problems are to arise. In the meantime, encouraged to call the office with any questions, concerns, changes symptoms.  Celesta Gentile, DPM

## 2018-02-21 ENCOUNTER — Encounter: Payer: Self-pay | Admitting: Allergy

## 2018-02-21 ENCOUNTER — Ambulatory Visit (INDEPENDENT_AMBULATORY_CARE_PROVIDER_SITE_OTHER): Payer: Medicare Other | Admitting: Allergy

## 2018-02-21 VITALS — BP 120/68 | HR 80 | Temp 98.3°F | Resp 16 | Ht 73.0 in | Wt 221.6 lb

## 2018-02-21 DIAGNOSIS — J441 Chronic obstructive pulmonary disease with (acute) exacerbation: Secondary | ICD-10-CM | POA: Diagnosis not present

## 2018-02-21 DIAGNOSIS — J309 Allergic rhinitis, unspecified: Secondary | ICD-10-CM

## 2018-02-21 DIAGNOSIS — L2084 Intrinsic (allergic) eczema: Secondary | ICD-10-CM

## 2018-02-21 DIAGNOSIS — J4550 Severe persistent asthma, uncomplicated: Secondary | ICD-10-CM | POA: Diagnosis not present

## 2018-02-21 DIAGNOSIS — H1010 Acute atopic conjunctivitis, unspecified eye: Secondary | ICD-10-CM | POA: Diagnosis not present

## 2018-02-21 MED ORDER — CETIRIZINE HCL 10 MG PO TABS: 10.0000 mg | ORAL_TABLET | Freq: Every day | ORAL | 5 refills | Status: DC

## 2018-02-21 MED ORDER — TRIAMCINOLONE ACETONIDE 0.1 % EX OINT: 1.0000 "application " | TOPICAL_OINTMENT | Freq: Two times a day (BID) | CUTANEOUS | 5 refills | Status: DC | PRN

## 2018-02-21 MED ORDER — MONTELUKAST SODIUM 10 MG PO TABS: 10.0000 mg | ORAL_TABLET | Freq: Every day | ORAL | 5 refills | Status: DC

## 2018-02-21 NOTE — Patient Instructions (Addendum)
Asthma    - have access to albuterol inhaler 2 puffs or nebulizer 1 vial every 4-6 hours as needed for cough/wheeze/shortness of breath/chest tightness.  May use 15-20 minutes prior to activity.   Monitor frequency of use.      - continue Dulera 227mcg 2 puffs twice a day    - start Singulair 10mg  daily - take at bedtime    - will obtain chest CT to assess for emphysematous changes given smoking history and recent hospitalizations for asthma exacerbations    - will obtain CBC with diff now that you are off prednisone to assess for degree of eosinophils    - keep up the good work in not smoking!!!!!     Asthma control goals:   Full participation in all desired activities (may need albuterol before activity)  Albuterol use two time or less a week on average (not counting use with activity)  Cough interfering with sleep two time or less a month  Oral steroids no more than once a year  No hospitalizations  Rhinoconjunctivitis, presumed allergic    - start taking either Xyzal 5mg  or Zyrtec 10mg  daily     - for nasal congestion recommend use of nasal steroid spray like Flonase, Nasacort or Rhinocort 1-2 sprays each nostril daily.  Use for 1-2 weeks at time before stopping once symptoms improve.      - for itchy eyes may use OTC Zaditor or Alaway.  Recommend discussing with your ophthalmologist to ensure ok to use in conjunction with our glaucoma medications.      - will obtain environmental allergy panel  Eczema   - Bathe/soak for 10 minutes in warm water once a day. Pat dry.  Immediately apply the below cream/ointments prescribed to red/inflamed/itchy/dry patchy areas only. Wait 5-10 minutes and then apply emollients like Eucerin or Lubriderm or Cetaphil or Aquaphor or Vasline twice a day all over. To affected areas on the body (below the face and neck), apply: . Triamcinolone 0.1 % ointment twice a day as needed. You may benefit from [place], biologic injectable medication, for moderate to  severe eczema.      Follow-up 3-4 months or sooner if needed

## 2018-02-21 NOTE — Progress Notes (Signed)
New Patient Note  RE: Richard Fraizer MRN: [id number] DOB: 1961-07-09 Date of Office Visit: 02/21/2018  Referring provider: [name], MD Primary care provider: [name], DO  Chief Complaint: asthma  History of present illness: Richard [name] is a 57 y.o. male presenting today for consultation for uncontrolled asthma.    He has had 2 hospitalizations for asthma in April 4/8-4/10 and 4/11-4/12.  He was treated with prednisone, duonebs and antihistamine with improvement.   After the first discharge he went home and worked outside and didn't continue antihistamine and developed chest tightness and wheezing prompting second admission.  He again was treated with prednisone, duo nebs antihistamines.  On this discharge his Ruthe Mannan was increased; he was provided with a prednisone taper and nebulizer machine and recommendation to continue his antihistamines and referral to allergy was made.  He also was advised to discontinue Breo which he was taking both Breo and Dulera prior as she states his asthma symptoms were "out of control" when he was smoking.    He has a history of asthma diagnosed in childhood.  He also is a former smoker with quit date after his last hospitalizations and does have a diagnosis of COPD as well.  He is now on Dulera 200 2 puffs twice a day.    He states since he has been discharged on increased Dulera he states he hardly needs to use albuterol now.  He also quit smoking and states he has not smoked since and he can tell a vast difference in his breathing.  No history of intubation.  He has not been on singulair before.     He has allergy symptoms worse in the morning.  He states he has itchy eyes and has to blink a lot.  He also has to blow his nose more in the morning.  Sometimes he states the drainage in runny or thick.  He has occasional sneezing.  He states he did not take any allergy medications as advised as he wanted to wait until this appointment to  discuss.     He has history of eczema.  Winter time is worst time for him.  He states he was using hydrocortisone which helps somewhat.  Problem areas include arms, legs, abdomen and back.  Review of systems: Review of Systems  Constitutional: Negative for chills, fever and malaise/fatigue.  HENT: Positive for congestion. Negative for ear discharge, ear pain, nosebleeds, sinus pain and sore throat.   Eyes: Negative for blurred vision, pain, discharge and redness.  Respiratory: Positive for cough, shortness of breath and wheezing.   Cardiovascular: Negative for chest pain.  Gastrointestinal: Negative for abdominal pain, constipation, diarrhea, heartburn, nausea and vomiting.  Musculoskeletal: Negative for joint pain.  Skin: Positive for itching and rash.  Neurological: Negative for headaches.    All other systems negative unless noted above in HPI  Past medical history: Past Medical History:  Diagnosis Date  . Asthma   . Clostridium difficile colitis 09/09/2014  . COPD (chronic obstructive pulmonary disease) ([place])   . Diabetes mellitus without complication ([place])    diagnosed in 2013  . Eczema   . Essential hypertension   . Glaucoma    bilateral, surgery on left eye but not right  . Hyperlipidemia   . Neuromuscular disorder (HCC)    neuropathy in feet  . 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 Substance abuse ([place])    crack - recovered x 30 yrs    Past surgical history: Past  Surgical History:  Procedure Laterality Date  . APPENDECTOMY    . CARDIAC CATHETERIZATION N/A 09/26/2015   Procedure: Left Heart Cath and Coronary Angiography;  Surgeon: [name], MD;  Location: [facility];  Service: Cardiovascular;  Laterality: N/A;  . left eye surgery Left    glaucoma  . SKIN GRAFT      Family history:  Family History  Problem Relation Age of Onset  . Hypertension Mother   . Hypertension Sister   . Glaucoma Sister   . Colon cancer Neg Hx   . Colon polyps Neg Hx   .  Esophageal cancer Neg Hx   . Rectal cancer Neg Hx   . Stomach cancer Neg Hx     Social history: Lives in a townhouse with carpeting with electric heating and central cooling.  No pets in the home but dog outside the home.  No concern for water damage, mildew or roaches in the home.  Currently unemployed.  Tobacco Use  . Smoking status: Former Smoker    Packs/day: 1.00    Types: Cigarettes    Last attempt to quit: 01/12/2018    Years since quitting: 0.1  . Smokeless tobacco: Former Systems developer    Quit date: 08/24/2016    Medication List: Allergies as of 02/21/2018   No Known Allergies     Medication List        Accurate as of 02/21/18  4:01 PM. Always use your most recent med list.          ACCU-CHEK FASTCLIX LANCETS Misc Check blood sugar two times a day   ACCU-CHEK GUIDE w/Device Kit 1 each by Does not apply route 2 (two) times daily.   aspirin 81 MG EC tablet Take 1 tablet (81 mg total) by mouth daily.   atorvastatin 40 MG tablet Commonly known as:  LIPITOR Take 1 tablet (40 mg total) by mouth daily.   cephALEXin 500 MG capsule Commonly known as:  KEFLEX Take 1 capsule (500 mg total) by mouth 3 (three) times daily.   cetirizine 10 MG tablet Commonly known as:  ZYRTEC Take 1 tablet (10 mg total) by mouth daily.   diclofenac sodium 1 % Gel Commonly known as:  VOLTAREN Apply 2-4 g topically 4 (four) times daily. Apply 2g to right shoulder, 4 grams to left knee.   DRY EYES OP Place 1-2 drops into both eyes every 6 (six) hours as needed (for dry eyes).   Dulaglutide 1.5 MG/0.5ML Sopn Commonly known as:  TRULICITY Inject 1.5 mg into the skin once a week.   empagliflozin 10 MG Tabs tablet Commonly known as:  JARDIANCE Take 10 mg by mouth daily.   glucose blood test strip Commonly known as:  ACCU-CHEK GUIDE Check blood sugar two times a day   latanoprost 0.005 % ophthalmic solution Commonly known as:  XALATAN Instill 1 drop into both eyes at bedtime     levocetirizine 5 MG tablet Commonly known as:  XYZAL Take 1 tablet (5 mg total) by mouth every evening.   metFORMIN 1000 MG tablet Commonly known as:  GLUCOPHAGE Take 1 tablet (1,000 mg total) by mouth 2 (two) times daily with a meal.   mometasone-formoterol 200-5 MCG/ACT Aero Commonly known as:  DULERA Inhale 2 puffs into the lungs 2 (two) times daily.   montelukast 10 MG tablet Commonly known as:  SINGULAIR Take 1 tablet (10 mg total) by mouth at bedtime.   MULTIVITAMIN ADULT PO Take 1 tablet by mouth daily.  pregabalin 100 MG capsule Commonly known as:  LYRICA Take 1 capsule (100 mg total) by mouth 3 (three) times daily.   PROAIR HFA 108 (90 Base) MCG/ACT inhaler Generic drug:  albuterol Inhale 2 puffs into the lungs every 4 (four) hours as needed for wheezing or shortness of breath.   albuterol (2.5 MG/3ML) 0.083% nebulizer solution Commonly known as:  PROVENTIL Take 3 mLs (2.5 mg total) by nebulization every 6 (six) hours as needed for wheezing or shortness of breath.   sildenafil 100 MG tablet Commonly known as:  VIAGRA Take 1 tablet (100 mg total) by mouth as needed for erectile dysfunction.   timolol 0.5 % ophthalmic solution Commonly known as:  TIMOPTIC Place 1 drop into both eyes 2 (two) times daily.   triamcinolone ointment 0.1 % Commonly known as:  KENALOG Apply 1 application topically 2 (two) times daily as needed.       Known medication allergies: No Known Allergies   Physical examination: Blood pressure 120/68, pulse 80, temperature 98.3 F (36.8 C), temperature source Oral, resp. rate 16, height '6\' 1"'$  (1.854 m), weight 221 lb 9.6 oz (100.5 kg), SpO2 97 %.  General: Alert, interactive, in no acute distress. HEENT: PERRLA, TMs pearly gray, turbinates mildly edematous with thick discharge, post-pharynx non erythematous. Neck: Supple without lymphadenopathy. Lungs: Mildly decreased breath sounds bilaterally without wheezing, rhonchi or rales.  {no increased work of breathing. CV: Normal S1, S2 without murmurs. Abdomen: Nondistended, nontender. Skin: Dry, hyperpigmented, thickened patches on the arms, legs, abdomen back.  most areas are nummular appearing. Extremities:  No clubbing, cyanosis or edema. Neuro:   Grossly intact.  Diagnositics/Labs: Labs:  Component     Latest Ref Rng & Units 01/16/2018  WBC     4.0 - 10.5 K/uL 7.3  RBC     4.22 - 5.81 MIL/uL 5.13  Hemoglobin     13.0 - 17.0 g/dL 13.8  HCT     39.0 - 52.0 % 42.8  MCV     78.0 - 100.0 fL 83.4  MCH     26.0 - 34.0 pg 26.9  MCHC     30.0 - 36.0 g/dL 32.2  RDW     11.5 - 15.5 % 13.6  Platelets     150 - 400 K/uL 284  Neutrophils     % 91  NEUT#     1.7 - 7.7 K/uL 6.6  Lymphocytes     % 7  Lymphocyte #     0.7 - 4.0 K/uL 0.5 (L)  Monocytes Relative     % 2  Monocyte #     0.1 - 1.0 K/uL 0.2  Eosinophil     % 0  Eosinophils Absolute     0.0 - 0.7 K/uL 0.0  Basophil     % 0  Basophils Absolute     0.0 - 0.1 K/uL 0.0  Recent prednisone use with the CBC  Influenza a and B by PCR -01/14/2018  Chest x-ray from 4/8 and 4/11 reviewed personally.  No consolidations.  Prominent interstitial markings.    Spirometry: FEV1: 1.51L 43%, FVC: 2.07L 46%, ratio consistent with severe restriction  Post duoneb he had improvement in FEV1 to 1.79L  Allergy testing: deferred due to decreased lung function   Assessment and plan:   Asthma, severe persistent with COPD    - control is improved now that he no longer smokes however lung function is still quite low.      - have access  to albuterol inhaler 2 puffs or nebulizer 1 vial every 4-6 hours as needed for cough/wheeze/shortness of breath/chest tightness.  May use 15-20 minutes prior to activity.   Monitor frequency of use.      - continue Dulera 274mg 2 puffs twice a day    - start Singulair '10mg'$  daily - take at bedtime    - will obtain chest CT to assess for emphysematous changes given smoking history and  recent hospitalizations for asthma exacerbations.  CXR noting prominent interstitial markings.     - will obtain CBC with diff now that you are off prednisone to assess for degree of eosinophils    - keep up the good work in not smoking!!!!!     Asthma control goals:   Full participation in all desired activities (may need albuterol before activity)  Albuterol use two time or less a week on average (not counting use with activity)  Cough interfering with sleep two time or less a month  Oral steroids no more than once a year  No hospitalizations  Rhinoconjunctivitis, presumed allergic    - start taking either Xyzal '5mg'$  or Zyrtec '10mg'$  daily     - for nasal congestion recommend use of nasal steroid spray like Flonase, Nasacort or Rhinocort 1-2 sprays each nostril daily.  Use for 1-2 weeks at time before stopping once symptoms improve.      - for itchy eyes may use OTC Zaditor or Alaway.  Recommend discussing with your ophthalmologist to ensure ok to use in conjunction with our glaucoma medications.      - will obtain environmental allergy panel  Eczema   - Bathe/soak for 10 minutes in warm water once a day. Pat dry.  Immediately apply the below cream/ointments prescribed to red/inflamed/itchy/dry patchy areas only. Wait 5-10 minutes and then apply emollients like Eucerin or Lubriderm or Cetaphil or Aquaphor or Vasline twice a day all over. To affected areas on the body (below the face and neck), apply: . Triamcinolone 0.1 % ointment twice a day as needed. You may benefit from [place] biologic injectable medication, for moderate to severe eczema.      Follow-up 3-4 months or sooner if needed  I appreciate the opportunity to take part in Avrom's care. Please do not hesitate to contact me with questions.  Sincerely,   SPrudy Feeler MD Allergy/Immunology [facility] [place]

## 2018-02-28 ENCOUNTER — Ambulatory Visit: Payer: Medicare Other | Admitting: Podiatry

## 2018-03-01 LAB — CBC WITH DIFFERENTIAL/PLATELET
BASOS ABS: 0.1 10*3/uL (ref 0.0–0.2)
BASOS: 1 %
EOS (ABSOLUTE): 0.5 10*3/uL — AB (ref 0.0–0.4)
Eos: 10 %
HEMOGLOBIN: 13.1 g/dL (ref 13.0–17.7)
Hematocrit: 39.4 % (ref 37.5–51.0)
IMMATURE GRANS (ABS): 0.1 10*3/uL (ref 0.0–0.1)
IMMATURE GRANULOCYTES: 1 %
Lymphocytes Absolute: 1.1 10*3/uL (ref 0.7–3.1)
Lymphs: 21 %
MCH: 26.6 pg (ref 26.6–33.0)
MCHC: 33.2 g/dL (ref 31.5–35.7)
MCV: 80 fL (ref 79–97)
MONOCYTES: 10 %
Monocytes Absolute: 0.5 10*3/uL (ref 0.1–0.9)
NEUTROS ABS: 2.9 10*3/uL (ref 1.4–7.0)
NEUTROS PCT: 57 %
PLATELETS: 278 10*3/uL (ref 150–379)
RBC: 4.92 x10E6/uL (ref 4.14–5.80)
RDW: 14.4 % (ref 12.3–15.4)
WBC: 5.2 10*3/uL (ref 3.4–10.8)

## 2018-03-01 LAB — ALLERGENS W/TOTAL IGE AREA 2
Alternaria Alternata IgE: 0.68 kU/L — AB
Aspergillus Fumigatus IgE: 1.43 kU/L — AB
Bermuda Grass IgE: 0.11 kU/L — AB
Cladosporium Herbarum IgE: 2.17 kU/L — AB
Cockroach, German IgE: 1.59 kU/L — AB
Cottonwood IgE: 0.15 kU/L — AB
D Pteronyssinus IgE: 26.6 kU/L — AB
D002-IGE D FARINAE: 23 kU/L — AB
Dog Dander IgE: 0.14 kU/L — AB
IgE (Immunoglobulin E), Serum: 886 IU/mL — ABNORMAL HIGH (ref 6–495)
Johnson Grass IgE: 0.18 kU/L — AB
Oak, White IgE: 0.34 kU/L — AB
Pecan, Hickory IgE: <0.1 kU/L
Penicillium Chrysogen IgE: 0.91 kU/L — AB
Ragweed, Short IgE: 0.27 kU/L — AB
SHEEP SORREL IGE QN: 0.11 kU/L — AB
T001-IGE MAPLE/BOX ELDER: 0.22 kU/L — AB
T006-IGE CEDAR, MOUNTAIN: 0.27 kU/L — AB
T008-IGE ELM, AMERICAN: 0.19 kU/L — AB
Timothy Grass IgE: 0.14 kU/L — AB
W014-IGE PIGWEED, ROUGH: 0.1 kU/L — AB
White Mulberry IgE: <0.1 kU/L

## 2018-03-14 ENCOUNTER — Ambulatory Visit: Payer: Medicare Other | Admitting: Podiatry

## 2018-03-18 ENCOUNTER — Telehealth: Payer: Self-pay | Admitting: Pharmacist

## 2018-03-19 DIAGNOSIS — J45901 Unspecified asthma with (acute) exacerbation: Secondary | ICD-10-CM | POA: Diagnosis not present

## 2018-04-02 NOTE — Progress Notes (Signed)
Called patient to follow up with medication help. Patient states he is doing well overall, feels much better since hospitalizations past few months. No questions or concerns today. Advised patient to schedule an [facility] follow up appointment. Patient verbalized understanding.

## 2018-04-18 DIAGNOSIS — H524 Presbyopia: Secondary | ICD-10-CM | POA: Diagnosis not present

## 2018-04-18 DIAGNOSIS — J45901 Unspecified asthma with (acute) exacerbation: Secondary | ICD-10-CM | POA: Diagnosis not present

## 2018-04-18 DIAGNOSIS — H401122 Primary open-angle glaucoma, left eye, moderate stage: Secondary | ICD-10-CM | POA: Diagnosis not present

## 2018-05-09 ENCOUNTER — Other Ambulatory Visit: Payer: Self-pay | Admitting: Podiatry

## 2018-05-09 ENCOUNTER — Ambulatory Visit (INDEPENDENT_AMBULATORY_CARE_PROVIDER_SITE_OTHER): Payer: Medicare Other | Admitting: Podiatry

## 2018-05-09 ENCOUNTER — Encounter: Payer: Self-pay | Admitting: Podiatry

## 2018-05-09 ENCOUNTER — Ambulatory Visit (INDEPENDENT_AMBULATORY_CARE_PROVIDER_SITE_OTHER): Payer: Medicare Other

## 2018-05-09 DIAGNOSIS — M79674 Pain in right toe(s): Secondary | ICD-10-CM | POA: Diagnosis not present

## 2018-05-09 DIAGNOSIS — M79675 Pain in left toe(s): Secondary | ICD-10-CM

## 2018-05-09 DIAGNOSIS — E1142 Type 2 diabetes mellitus with diabetic polyneuropathy: Secondary | ICD-10-CM

## 2018-05-09 DIAGNOSIS — M79671 Pain in right foot: Secondary | ICD-10-CM

## 2018-05-09 DIAGNOSIS — B351 Tinea unguium: Secondary | ICD-10-CM | POA: Diagnosis not present

## 2018-05-09 DIAGNOSIS — E08621 Diabetes mellitus due to underlying condition with foot ulcer: Secondary | ICD-10-CM

## 2018-05-09 DIAGNOSIS — L84 Corns and callosities: Secondary | ICD-10-CM

## 2018-05-09 DIAGNOSIS — B353 Tinea pedis: Secondary | ICD-10-CM | POA: Diagnosis not present

## 2018-05-09 DIAGNOSIS — L97511 Non-pressure chronic ulcer of other part of right foot limited to breakdown of skin: Secondary | ICD-10-CM | POA: Diagnosis not present

## 2018-05-09 MED ORDER — CICLOPIROX OLAMINE 0.77 % EX CREA: TOPICAL_CREAM | Freq: Two times a day (BID) | CUTANEOUS | 0 refills | Status: DC

## 2018-05-09 MED ORDER — CEPHALEXIN 500 MG PO CAPS: 500.0000 mg | ORAL_CAPSULE | Freq: Four times a day (QID) | ORAL | 0 refills | Status: DC

## 2018-05-11 NOTE — Progress Notes (Signed)
Subjective:   Patient presents today for diabetic foot care and continued care of mycotic toenails and preulcerative callus of right foot.    Patient states he has been taking his daughter to the water park. He has been wearing flip flops, but admits to getting his feet wet.  He denies nausea, vomiting, fevers or chills.  Denies changes to ulceration  Objective:    AAO 3, NAD DP/PT pulses palpable, CRT less than 3 seconds Protective sensation decreased with Simms Weinstein monofilament Nails hypertrophic, dystrophic, elongated, brittle, discolored 10. There is tenderness overlying the nails 1-5 bilaterally. There is no surrounding erythema or drainage along the nail sites.  Preulcerative macerated callus located submetatarsal head 3 right foot.  +Mild odor. No redness, streaking or lymphangitis noted.  No probing to bone, no undermining, no active pus or purulence noted.    Diffuse scaling noted plantarly and peripherally with mild foot odor.  Xrays right foot: +Vessel calcifications Cortices of metatarsals remain intact with no evidence of bony erosion  Assessment:   1. Onychomycosis 1-5 b/l 2.Preulcerative callus submet 3 right foot 3. Tinea pedis b/l 4. NIDDM with peripheral neuropathy  Plan: 1. Toenails 1-5 b/l debrided in length and girth. 2. Preulcerative lesion was debrided and reactive hyperkeratoses and necrotic tissue was resected to the level of bleeding or viable tissue. No deep abscess, no erythema, no edema, no cellulitis, no odor was encountered.   Betadine paint and a sterile dressing was applied.   3. Patient advised to keep right foot dry. He is to change dressing once daily with Iodosorb Gel. He related understanding. 4. Rx sent for cephalexin 500 mg po qid x 7 days. 5. Rx sent for Loprox Cream 0.77% to be applied to both feet bid x 4 weeks. He is only to apply to outside of his right foot dressing. 6. Monitor for any clinical signs or symptoms of infection and  directed to call the office immediately should any occur or go to the ER.

## 2018-05-16 ENCOUNTER — Encounter: Payer: Self-pay | Admitting: Podiatry

## 2018-05-16 ENCOUNTER — Ambulatory Visit (INDEPENDENT_AMBULATORY_CARE_PROVIDER_SITE_OTHER): Payer: Medicare Other | Admitting: Podiatry

## 2018-05-16 DIAGNOSIS — E1142 Type 2 diabetes mellitus with diabetic polyneuropathy: Secondary | ICD-10-CM | POA: Diagnosis not present

## 2018-05-16 DIAGNOSIS — L97411 Non-pressure chronic ulcer of right heel and midfoot limited to breakdown of skin: Secondary | ICD-10-CM

## 2018-05-16 DIAGNOSIS — E08621 Diabetes mellitus due to underlying condition with foot ulcer: Secondary | ICD-10-CM | POA: Diagnosis not present

## 2018-05-16 DIAGNOSIS — B353 Tinea pedis: Secondary | ICD-10-CM

## 2018-05-19 DIAGNOSIS — J45901 Unspecified asthma with (acute) exacerbation: Secondary | ICD-10-CM | POA: Diagnosis not present

## 2018-05-19 NOTE — Progress Notes (Signed)
Subjective:   [name] presents today for 1 week  follow up of diabetic ulceration foot.   Today, he presents to clinic wearing leather fisherman sandals.  He feels his foot is doing better. He has been keeping the right foot dry. He has been applying Iodosorb to ulcer.   For his tinea, he has not used his Loprox Cream. He thought the Loprox Cream was to be used after his ulcer got better.  He is still taking his antibiotics.  He denies nausea, vomiting, fevers or chills.  Denies any worsening of ulceration.  Objective:    AAO 3, NAD DP/PT pulses palpable, CRT less than 3 seconds Protective sensationdecreasedwith Simms Weinstein monofilament Nails hypertrophic, dystrophic, elongated, brittle, discolored 10. There is tenderness overlying the nails 1-5 bilaterally. There is no surrounding erythema or drainage along the nail sites.  Preulcerative macerated callus located submetatarsal head 3 right foot.  Resolved odor. No redness, streaking or lymphangitis noted.  No probing to bone, no undermining, no active pus or purulence noted.    Diffuse scaling noted plantarly and peripherally with mild foot odor.  Xrays right foot on 05/09/18: +Vessel calcifications Cortices of metatarsals remain intact with no evidence of bony erosion  Assessment:   1. Preulcerative callus submet 3 right foot 3. Tinea pedis b/l 4. NIDDM with peripheral neuropathy  Plan: 1.  [name] was advised to wear his diabetic shoes and discontinue sandals. We discussed every time he wears other shoes, he puts more pressure on his ulcer site which leads to breakdown and wound formation. He related understanding.  2.Preulcerative lesion reactive hyperkeratoses  was debrided.  No deep abscess, no erythema, no edema, no cellulitis, no odor was encountered.  Iodosorb gel and light sterile dressing was applied. 3. He may shower, but dry foot well afterwards and continue Iodosorb.   4. Continue cephalexin 500 mg  po qid until all gone. 5. Rx sent for Loprox Cream 0.77% to be applied to both feet bid for four weeks. He is only to apply to outside of his right foot dressing. 6. Monitor for any clinical signs or symptoms of infection and directed to call the office immediately should any occur or go to the ER. 7. Follow up two weeks.

## 2018-05-20 ENCOUNTER — Other Ambulatory Visit: Payer: Self-pay

## 2018-05-20 MED ORDER — PREGABALIN 100 MG PO CAPS: 100.0000 mg | ORAL_CAPSULE | Freq: Three times a day (TID) | ORAL | 5 refills | Status: DC

## 2018-05-20 NOTE — Telephone Encounter (Signed)
The pharmacy is still waiting for the clinic to reply on pregabalin (LYRICA) 100 MG capsule. Pt is using  [facility], [place] AT [place] [phone] (Phone) [phone] (Fax

## 2018-05-20 NOTE — Telephone Encounter (Signed)
Next appt scheduled 8/15 with PCP. 

## 2018-05-22 ENCOUNTER — Other Ambulatory Visit: Payer: Self-pay

## 2018-05-22 ENCOUNTER — Encounter: Payer: Self-pay | Admitting: Internal Medicine

## 2018-05-22 ENCOUNTER — Ambulatory Visit (INDEPENDENT_AMBULATORY_CARE_PROVIDER_SITE_OTHER): Payer: Medicare Other | Admitting: Internal Medicine

## 2018-05-22 VITALS — BP 124/63 | HR 88 | Temp 98.7°F | Ht 75.0 in | Wt 236.9 lb

## 2018-05-22 DIAGNOSIS — E1140 Type 2 diabetes mellitus with diabetic neuropathy, unspecified: Secondary | ICD-10-CM | POA: Diagnosis not present

## 2018-05-22 DIAGNOSIS — E11319 Type 2 diabetes mellitus with unspecified diabetic retinopathy without macular edema: Secondary | ICD-10-CM

## 2018-05-22 DIAGNOSIS — E1165 Type 2 diabetes mellitus with hyperglycemia: Secondary | ICD-10-CM

## 2018-05-22 DIAGNOSIS — E11621 Type 2 diabetes mellitus with foot ulcer: Secondary | ICD-10-CM

## 2018-05-22 DIAGNOSIS — Z7984 Long term (current) use of oral hypoglycemic drugs: Secondary | ICD-10-CM

## 2018-05-22 DIAGNOSIS — IMO0002 Reserved for concepts with insufficient information to code with codable children: Secondary | ICD-10-CM

## 2018-05-22 DIAGNOSIS — L97519 Non-pressure chronic ulcer of other part of right foot with unspecified severity: Secondary | ICD-10-CM

## 2018-05-22 DIAGNOSIS — Z79899 Other long term (current) drug therapy: Secondary | ICD-10-CM

## 2018-05-22 DIAGNOSIS — I10 Essential (primary) hypertension: Secondary | ICD-10-CM

## 2018-05-22 DIAGNOSIS — E1139 Type 2 diabetes mellitus with other diabetic ophthalmic complication: Secondary | ICD-10-CM

## 2018-05-22 LAB — POCT GLYCOSYLATED HEMOGLOBIN (HGB A1C): Hemoglobin A1C: 9 % — AB (ref 4.0–5.6)

## 2018-05-22 LAB — GLUCOSE, CAPILLARY: Glucose-Capillary: 249 mg/dL — ABNORMAL HIGH (ref 70–99)

## 2018-05-22 NOTE — Patient Instructions (Signed)
Work on cutting out carbs as we discussed.

## 2018-05-25 NOTE — Assessment & Plan Note (Signed)
HPI: No complaints, reports adherence to all medications  A: Essential HTN well controlled  P: Continue Jardiance 10mg  daily

## 2018-05-25 NOTE — Progress Notes (Signed)
  Subjective:  HPI: Richard [name] is a 57 y.o. male who presents for 3 month follow up of DM  Please see Assessment and Plan below for the status of his chronic medical problems.  Review of Systems: Review of Systems  Constitutional: Negative for fever.  Respiratory: Negative for cough and shortness of breath.   Cardiovascular: Negative for chest pain.  Gastrointestinal: Negative for constipation and diarrhea.  Genitourinary: Negative for dysuria and frequency.  Endo/Heme/Allergies: Negative for polydipsia.    Objective:  Physical Exam: Vitals:   05/22/18 1316  BP: 124/63  Pulse: 88  Temp: 98.7 F (37.1 C)  TempSrc: Oral  SpO2: 99%  Weight: 236 lb 14.4 oz (107.5 kg)  Height: 6\' 3"  (1.905 m)   Physical Exam  Constitutional: He is well-developed, well-nourished, and in no distress.  Cardiovascular: Normal rate and regular rhythm.  Pulmonary/Chest: Effort normal and breath sounds normal. He has no wheezes.  Abdominal: Soft. Bowel sounds are normal. There is no tenderness.  Musculoskeletal: He exhibits no edema.  Skin:  Healing ulcer plantar aspect of right foot, no drainage, no surrounding errythema  Nursing note and vitals reviewed.  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  Has had some increased dietary indescretion lately.  Denies any hypoglycemia.  Denies polyuria or polydispia.  A: Uncontrolled Type 2 DM, with diabetic retinopahty, diabetic neuropahty, right foot ulcer  P:A1c increased to 9% Continue Trulicity to 1.5mg  weekly, Continue empagliflozin 10mg  daily Continue metformin 1g BID Discussed recommendation to start once daily long  acting insulin however he reports motivation to adhere to diabetic diet.  Is willing to meet with diabetic educator. Will arrage for him to see Butch Penny.  Diabetic neuropathy, painful ([place]) HPI; Feels lyrica is working well  A: Diabetic neuropathy  P: Continue lyrica 100mg  TID.  Type 2 diabetes mellitus with right diabetic foot ulcer (HCC) HPI: Has been following with [facility], realizes he has not been using diabetic shoes enough, he has them on today and will do better.  Just completed course of keflex for his ulcer.  Has follow up next week.  A: Diabetic foot ulcer  P: Continue following with podiatry.  Discussed importance of glycemic control    Medications Ordered No orders of the defined types were placed in this encounter.  Other Orders Orders Placed This Encounter  Procedures  . Glucose, capillary  . Referral to Nutrition and Diabetes Services    Referral Priority:   Routine    Referral Type:   Consultation    Referral Reason:   Specialty Services Required    Number of Visits Requested:   1  . POC Hbg A1C   Follow Up: Return in about 3 months (around 08/22/2018).

## 2018-05-25 NOTE — Assessment & Plan Note (Signed)
PZW:CHENIDPOEUMPNTI 1.5mg  weekly, jardiance 10mg  daily and metformin 1g BID.  Has had some increased dietary indescretion lately.  Denies any hypoglycemia.  Denies polyuria or polydispia.  A: Uncontrolled Type 2 DM, with diabetic retinopahty, diabetic neuropahty, right foot ulcer  P:A1c increased to 9% Continue Trulicity to 1.5mg  weekly, Continue empagliflozin 10mg  daily Continue metformin 1g BID Discussed recommendation to start once daily long acting insulin however he reports motivation to adhere to diabetic diet.  Is willing to meet with diabetic educator. Will arrage for him to see Butch Penny.

## 2018-05-25 NOTE — Assessment & Plan Note (Signed)
HPI; Feels lyrica is working well  A: Diabetic neuropathy  P: Continue lyrica 100mg  TID.

## 2018-05-25 NOTE — Assessment & Plan Note (Signed)
HPI: Has been following with [facility], realizes he has not been using diabetic shoes enough, he has them on today and will do better.  Just completed course of keflex for his ulcer.  Has follow up next week.  A: Diabetic foot ulcer  P: Continue following with podiatry.  Discussed importance of glycemic control

## 2018-05-30 ENCOUNTER — Encounter: Payer: Self-pay | Admitting: Allergy

## 2018-05-30 ENCOUNTER — Encounter: Payer: Self-pay | Admitting: Podiatry

## 2018-05-30 ENCOUNTER — Encounter

## 2018-05-30 ENCOUNTER — Ambulatory Visit (INDEPENDENT_AMBULATORY_CARE_PROVIDER_SITE_OTHER): Payer: Medicare Other | Admitting: Podiatry

## 2018-05-30 ENCOUNTER — Ambulatory Visit (INDEPENDENT_AMBULATORY_CARE_PROVIDER_SITE_OTHER): Payer: Medicare Other | Admitting: Allergy

## 2018-05-30 VITALS — BP 112/62 | HR 91 | Resp 16 | Ht 75.0 in | Wt 231.4 lb

## 2018-05-30 DIAGNOSIS — J4550 Severe persistent asthma, uncomplicated: Secondary | ICD-10-CM | POA: Diagnosis not present

## 2018-05-30 DIAGNOSIS — J441 Chronic obstructive pulmonary disease with (acute) exacerbation: Secondary | ICD-10-CM

## 2018-05-30 DIAGNOSIS — J309 Allergic rhinitis, unspecified: Secondary | ICD-10-CM

## 2018-05-30 DIAGNOSIS — E08621 Diabetes mellitus due to underlying condition with foot ulcer: Secondary | ICD-10-CM | POA: Diagnosis not present

## 2018-05-30 DIAGNOSIS — L97511 Non-pressure chronic ulcer of other part of right foot limited to breakdown of skin: Secondary | ICD-10-CM | POA: Diagnosis not present

## 2018-05-30 DIAGNOSIS — H1010 Acute atopic conjunctivitis, unspecified eye: Secondary | ICD-10-CM

## 2018-05-30 DIAGNOSIS — H538 Other visual disturbances: Secondary | ICD-10-CM

## 2018-05-30 DIAGNOSIS — B353 Tinea pedis: Secondary | ICD-10-CM

## 2018-05-30 DIAGNOSIS — H6123 Impacted cerumen, bilateral: Secondary | ICD-10-CM

## 2018-05-30 DIAGNOSIS — L2084 Intrinsic (allergic) eczema: Secondary | ICD-10-CM

## 2018-05-30 MED ORDER — LEVOCETIRIZINE DIHYDROCHLORIDE 5 MG PO TABS: 5.0000 mg | ORAL_TABLET | Freq: Every evening | ORAL | 3 refills | Status: DC

## 2018-05-30 NOTE — Progress Notes (Signed)
Follow-up Note  RE: Richard [name] MRN: [id number] DOB: 09-Apr-1961 Date of Office Visit: 05/30/2018   History of present illness: Richard [name] is a 57 y.o. male presenting today for follow-up of asthma with COPD, rhinoconjunctivitis and eczema.  He was last seen in the office on Feb 21, 2018 by myself for initial visit.  He states he has been doing relatively well since this visit.  In regards to his asthma with COPD he states he has not had any further asthma attacks since his last visit.  Furthermore he states he has not had any asthma attacks since he quit smoking which she has been smoke-free for 7 months!  He does take Dulera 200 mcg 2 puffs twice a day as well as Singulair daily.  He states he has not needed to use any albuterol since his last visit denies any nighttime awakenings.  He has not required any ED or urgent care visits or any oral steroids since his last visit.  He did not have his CT chest done after his last visit. In regards to his allergic rhinoconjunctivitis he states he has been having more issues with this lately with more nasal drainage.  He is not using any antihistamines at this time nor has he been using any nasal sprays. He does have triamcinolone for as needed use for eczema control which she has not needed to use since his last visit. He does state he has been having having some intermittent blurry vision.  He does have history of glaucoma and cataracts and is followed by eye specialist.  He has not addressed this with his eye specialist time.  Review of systems: Review of Systems  Constitutional: Negative for chills, fever and malaise/fatigue.  HENT: Positive for congestion. Negative for ear discharge, nosebleeds and sore throat.   Eyes: Positive for blurred vision. Negative for pain, discharge and redness.  Respiratory: Negative for cough, sputum production, shortness of breath and wheezing.   Cardiovascular: Negative for chest pain.  Gastrointestinal:  Negative for abdominal pain, constipation, diarrhea, nausea and vomiting.  Musculoskeletal: Negative for joint pain.  Skin: Negative for itching and rash.  Neurological: Negative for headaches.    All other systems negative unless noted above in HPI  Past medical/social/surgical/family history have been reviewed and are unchanged unless specifically indicated below.  No changes  Medication List: Allergies as of 05/30/2018   No Known Allergies     Medication List        Accurate as of 05/30/18  1:45 PM. Always use your most recent med list.          ACCU-CHEK FASTCLIX LANCETS Misc Check blood sugar two times a day   ACCU-CHEK GUIDE w/Device Kit 1 each by Does not apply route 2 (two) times daily.   aspirin 81 MG EC tablet Take 1 tablet (81 mg total) by mouth daily.   atorvastatin 40 MG tablet Commonly known as:  LIPITOR Take 1 tablet (40 mg total) by mouth daily.   cetirizine 10 MG tablet Commonly known as:  ZYRTEC Take 1 tablet (10 mg total) by mouth daily.   ciclopirox 0.77 % cream Commonly known as:  LOPROX Apply topically 2 (two) times daily.   diclofenac sodium 1 % Gel Commonly known as:  VOLTAREN Apply 2-4 g topically 4 (four) times daily. Apply 2g to right shoulder, 4 grams to left knee.   DRY EYES OP Place 1-2 drops into both eyes every 6 (six) hours as needed (for dry eyes).  Dulaglutide 1.5 MG/0.5ML Sopn Inject 1.5 mg into the skin once a week.   empagliflozin 10 MG Tabs tablet Commonly known as:  JARDIANCE Take 10 mg by mouth daily.   glucose blood test strip Check blood sugar two times a day   levocetirizine 5 MG tablet Commonly known as:  XYZAL Take 1 tablet (5 mg total) by mouth every evening.   metFORMIN 1000 MG tablet Commonly known as:  GLUCOPHAGE Take 1 tablet (1,000 mg total) by mouth 2 (two) times daily with a meal.   mometasone-formoterol 200-5 MCG/ACT Aero Commonly known as:  DULERA Inhale 2 puffs into the lungs 2 (two) times  daily.   montelukast 10 MG tablet Commonly known as:  SINGULAIR Take 1 tablet (10 mg total) by mouth at bedtime.   MULTIVITAMIN ADULT PO Take 1 tablet by mouth daily.   pregabalin 100 MG capsule Commonly known as:  LYRICA Take 1 capsule (100 mg total) by mouth 3 (three) times daily.   PROAIR HFA 108 (90 Base) MCG/ACT inhaler Generic drug:  albuterol Inhale 2 puffs into the lungs every 4 (four) hours as needed for wheezing or shortness of breath.   albuterol (2.5 MG/3ML) 0.083% nebulizer solution Commonly known as:  PROVENTIL Take 3 mLs (2.5 mg total) by nebulization every 6 (six) hours as needed for wheezing or shortness of breath.   timolol 0.5 % ophthalmic solution Commonly known as:  TIMOPTIC Place 1 drop into both eyes 2 (two) times daily.   triamcinolone ointment 0.1 % Commonly known as:  KENALOG Apply 1 application topically 2 (two) times daily as needed.       Known medication allergies: No Known Allergies   Physical examination: Blood pressure 112/62, pulse 91, resp. rate 16, height '6\' 3"'$  (1.905 m), weight 231 lb 6.4 oz (105 kg), SpO2 98 %.  General: Alert, interactive, in no acute distress. HEENT: PERRLA, TMs obscured bilaterally by cerumen, turbinates mildly edematous with clear discharge, post-pharynx non erythematous. Neck: Supple without lymphadenopathy. Lungs: Clear to auscultation without wheezing, rhonchi or rales. {no increased work of breathing. CV: Normal S1, S2 without murmurs. Abdomen: Nondistended, nontender. Skin: Warm and dry, without lesions or rashes. Extremities:  No clubbing, cyanosis or edema. Neuro:   Grossly intact.  Diagnositics/Labs: Labs:  Component     Latest Ref Rng & Units 02/21/2018  WBC     3.4 - 10.8 x10E3/uL 5.2  RBC     4.14 - 5.80 x10E6/uL 4.92  Hemoglobin     13.0 - 17.7 g/dL 13.1  HCT     37.5 - 51.0 % 39.4  MCV     79 - 97 fL 80  MCH     26.6 - 33.0 pg 26.6  MCHC     31.5 - 35.7 g/dL 33.2  RDW     12.3 -  15.4 % 14.4  Platelets     150 - 379 x10E3/uL 278  Neutrophils     Not Estab. % 57  Lymphs     Not Estab. % 21  Monocytes     Not Estab. % 10  Eos     Not Estab. % 10  Basos     Not Estab. % 1  NEUT#     1.4 - 7.0 x10E3/uL 2.9  Lymphocyte #     0.7 - 3.1 x10E3/uL 1.1  Monocytes Absolute     0.1 - 0.9 x10E3/uL 0.5  EOS (ABSOLUTE)     0.0 - 0.4 x10E3/uL 0.5 (H)  Basophils  Absolute     0.0 - 0.2 x10E3/uL 0.1  Immature Granulocytes     Not Estab. % 1  Immature Grans (Abs)     0.0 - 0.1 x10E3/uL 0.1   Component     Latest Ref Rng & Units 02/21/2018  IgE (Immunoglobulin E), Serum     6 - 495 IU/mL 886 (H)  D Pteronyssinus IgE     Class V kU/L 26.60 (A)  D Farinae IgE     Class V kU/L 23.00 (A)  Cat Dander IgE     Class 0 kU/L <0.10  Dog Dander IgE     Class 0/I kU/L 0.14 (A)  [place] Grass IgE     Class 0/I kU/L 0.11 (A)  Timothy Grass IgE     Class 0/I kU/L 0.14 (A)  Johnson Grass IgE     Class 0/I kU/L 0.18 (A)  Cockroach, German IgE     Class III kU/L 1.59 (A)  Penicillium Chrysogen IgE     Class II kU/L 0.91 (A)  Cladosporium Herbarum IgE     Class III kU/L 2.17 (A)  Aspergillus Fumigatus IgE     Class III kU/L 1.43 (A)  Alternaria Alternata IgE     Class II kU/L 0.68 (A)  Maple/Box Elder IgE     Class 0/I kU/L 0.22 (A)  Common Silver Wendee Copp IgE     Class 0 kU/L <0.10  Cedar, [place] IgE     Class 0/I kU/L 0.27 (A)  Oak, White IgE     Class I kU/L 0.34 (A)  Elm, American IgE     Class 0/I kU/L 0.19 (A)  Cottonwood IgE     Class 0/I kU/L 0.15 (A)  Pecan, Hickory IgE     Class 0 kU/L <0.10  White Mulberry IgE     Class 0 kU/L <0.10  Ragweed, Short IgE     Class 0/I kU/L 0.27 (A)  Pigweed, Rough IgE     Class 0/I kU/L 0.10 (A)  Sheep Sorrel IgE Qn     Class 0/I kU/L 0.11 (A)  Mouse Urine IgE     Class 0 kU/L <0.10    Spirometry: FEV1: 1.75L 46%, FVC: 2.38L 49%, ratio consistent with Restrictive pattern.  Stable from previous  study  Assessment and plan:   Asthma severe persistent with COPD overlap    - have access to albuterol inhaler 2 puffs or nebulizer 1 vial every 4-6 hours as needed for cough/wheeze/shortness of breath/chest tightness.  May use 15-20 minutes prior to activity.   Monitor frequency of use.      - continue Dulera 226mg 2 puffs twice a day    - continue Singulair '10mg'$  daily - take at bedtime    -Given his long-standing smoking history and known COPD with severe persistent asthma also with reduced lung function like to characterize his lungs with a chest CT without contrast.  Specifically looking to see if he has any emphysematous changes or any bronchiectasis that might be contributing to his decreased lung function.      -He is eligible for injectable medications like Nucala, Fasenra or Dupixent if we need to step up your asthma regimen    - keep up the good work in not smoking!!!!!     Asthma control goals:   Full participation in all desired activities (may need albuterol before activity)  Albuterol use two time or less a week on average (not counting use with activity)  Cough interfering with sleep  two time or less a month  Oral steroids no more than once a year  No hospitalizations  Rhinoconjunctivitis, allergic    -  for nasal drainage start nasal antihistamine, Astelin 2 sprays each nostril 1-2 times a day    - if still having allergy symptoms despite nasal spray use then start taking either Xyzal '5mg'$  or Zyrtec '10mg'$  daily     - perform allergen avoidance measures for dust mites, dog, cat, cockroach, pollens  Eczema   - Bathe/soak for 10 minutes in warm water once a day. Pat dry.  Immediately apply the below cream/ointments prescribed to red/inflamed/itchy/dry patchy areas only. Wait 5-10 minutes and then apply emollients like Eucerin or Lubriderm or Cetaphil or Aquaphor or Vasline twice a day all over. To affected areas on the body (below the face and neck), apply: . Triamcinolone  0.1 % ointment twice a day as needed.  Cerumen impaction   - ear wax blocking canal in both ears.   Ask your PCP if they perform ear irrigation or ear curretage to remove wax from ear.   Blurry vision  -With his history of glaucoma and cataracts I have advised that he discuss this with his ophthalmologist as soon as possible.   -His blood pressure today is normal  Follow-up 4 months or sooner if needed  I appreciate the opportunity to take part in Kenton's care. Please do not hesitate to contact me with questions.  Sincerely,   Prudy Feeler, MD Allergy/Immunology [facility]

## 2018-05-30 NOTE — Patient Instructions (Addendum)
Asthma    - have access to albuterol inhaler 2 puffs or nebulizer 1 vial every 4-6 hours as needed for cough/wheeze/shortness of breath/chest tightness.  May use 15-20 minutes prior to activity.   Monitor frequency of use.      - continue Dulera 275mcg 2 puffs twice a day    - continue Singulair 10mg  daily - take at bedtime    - Obtain chest CT to assess for emphysematous changes given smoking history and reduced lung function on spirometry    - you are eligible for injectable medications like Nucala, Fasenra or Dupixent if we need to step up your asthma regimen    - keep up the good work in not smoking!!!!!     Asthma control goals:   Full participation in all desired activities (may need albuterol before activity)  Albuterol use two time or less a week on average (not counting use with activity)  Cough interfering with sleep two time or less a month  Oral steroids no more than once a year  No hospitalizations  Rhinoconjunctivitis, allergic    -  for nasal drainage start nasal antihistamine, Astelin 2 sprays each nostril 1-2 times a day    - if still having allergy symptoms despite nasal spray use then start taking either Xyzal 5mg  or Zyrtec 10mg  daily     - perform allergen avoidance measures for dust mites, dog, cat, cockroach, pollens  Eczema   - Bathe/soak for 10 minutes in warm water once a day. Pat dry.  Immediately apply the below cream/ointments prescribed to red/inflamed/itchy/dry patchy areas only. Wait 5-10 minutes and then apply emollients like Eucerin or Lubriderm or Cetaphil or Aquaphor or Vasline twice a day all over. To affected areas on the body (below the face and neck), apply: . Triamcinolone 0.1 % ointment twice a day as needed.  Cerumen impaction   - ear wax blocking canal in both ears.   Ask your PCP if they perform ear irrigation or ear curretage to remove wax from ear.    Follow-up 4 months or sooner if needed

## 2018-06-01 MED ORDER — CICLOPIROX OLAMINE 0.77 % EX CREA: TOPICAL_CREAM | Freq: Two times a day (BID) | CUTANEOUS | 0 refills | Status: DC

## 2018-06-01 MED ORDER — NYSTATIN 100000 UNIT/GM EX POWD: CUTANEOUS | 0 refills | Status: DC

## 2018-06-01 NOTE — Progress Notes (Signed)
Subjective:  [name] presents today for 2 week follow up of diabetic ulceration foot.   Today, he presents to clinic wearing his diabetic shoes.  He states he has been following my instructions and  feels his foot is doing much better. He has been applying Iodosorb to ulcer.   For his tinea pedis, he has been using his Loprox Cream as instructed and feels his feet look better as well.   He denies fever, chills Denies any worsening of ulceration.  Objective: AAO 3, NAD DP/PT pulses palpable, CRT less than 3 seconds Protective sensationdecreasedwith Simms Weinstein monofilament Nails hypertrophic, dystrophic, elongated, brittle, discolored 10. Toenails today are adequate length.  Preulcerative calluslocated submetatarsal head 3 right foot.No flocculence. No perilesion erythema.  lymphangitis noted. No probing to bone, no undermining, no active pus or purulence noted.Area measures 3.0 x 2.5 cm and is not open.  Diffuse scaling noted plantarly and peripherally, improved, but not resolved. Foot odor has resolved.  Mild maceration noted 4th webspace b/l.  Xrays right foot on 05/09/18: +Vessel calcifications Cortices of metatarsals remain intact with no evidence of bony erosion  Assessment: 1. Preulcerative callus submet 3 right foot 3. Tinea pedis b/l (interdigital and peripheral cutaneous) 4. NIDDM with peripheral neuropathy  Plan: 1.  [name] is doing well on today. He is to continue to wear his diabetic shoes daily.  2.Preulcerative lesionreactive hyperkeratoses was debrided.  Preulcerative callus debrided. Area cleansed with wound cleanser. Iodosorb gel and bandaid applied. 3. He may shower, but dry foot well afterwards and continue Iodosorb and bandaid. 4. No clinical signs of infection noted today. No need for continued oral antibiotics. 5. For tinea pedis: Refill Loprox Cream 0.77% to be applied to both feet twice daily for  two more weeks. Apply  Nystatin Powder between 4th and 5th digits once daily. 6.Monitor for any clinical signs or symptoms of infection and directed to call the office immediately should any occur or go to the ER. 7. Follow up one week.

## 2018-06-02 ENCOUNTER — Other Ambulatory Visit: Payer: Self-pay | Admitting: Internal Medicine

## 2018-06-02 MED ORDER — ACCU-CHEK FASTCLIX LANCETS MISC: 5 refills | Status: DC

## 2018-06-02 MED ORDER — GLUCOSE BLOOD VI STRP: ORAL_STRIP | 5 refills | Status: DC

## 2018-06-02 NOTE — Telephone Encounter (Addendum)
All refills up to date except lancets, test strips and albuterol MDI. Call placed to [facility]. Confirmed patient has picked up meds recently and has active refills on everything except strips and lancets. They have 11 refills on albuterol MDI. Call placed to patient. He confirms all he needs is strips and lancets. Request sent to Attending Pool. Patient very appreciative. Hubbard Hartshorn, RN, BSN

## 2018-06-02 NOTE — Telephone Encounter (Signed)
Pt states "All" of his medications were to be called in to the [facility] on [address] and they have do not have the refills.  Pt also requesting a call back.

## 2018-06-05 ENCOUNTER — Encounter: Payer: Medicare Other | Admitting: Dietician

## 2018-06-06 ENCOUNTER — Encounter: Payer: Self-pay | Admitting: Podiatry

## 2018-06-06 ENCOUNTER — Ambulatory Visit (HOSPITAL_COMMUNITY)
Admission: RE | Admit: 2018-06-06 | Discharge: 2018-06-06 | Disposition: A | Discharge status: Home / Self Care | Payer: Medicare Other | Source: Ambulatory Visit | Attending: Allergy | Admitting: Allergy

## 2018-06-06 ENCOUNTER — Ambulatory Visit (INDEPENDENT_AMBULATORY_CARE_PROVIDER_SITE_OTHER): Payer: Medicare Other | Admitting: Podiatry

## 2018-06-06 DIAGNOSIS — J4550 Severe persistent asthma, uncomplicated: Secondary | ICD-10-CM | POA: Diagnosis not present

## 2018-06-06 DIAGNOSIS — E08621 Diabetes mellitus due to underlying condition with foot ulcer: Secondary | ICD-10-CM | POA: Diagnosis not present

## 2018-06-06 DIAGNOSIS — B353 Tinea pedis: Secondary | ICD-10-CM

## 2018-06-06 DIAGNOSIS — I2510 Atherosclerotic heart disease of native coronary artery without angina pectoris: Secondary | ICD-10-CM | POA: Insufficient documentation

## 2018-06-06 DIAGNOSIS — J441 Chronic obstructive pulmonary disease with (acute) exacerbation: Secondary | ICD-10-CM | POA: Diagnosis present

## 2018-06-06 DIAGNOSIS — I700 Atherosclerosis of aorta: Secondary | ICD-10-CM | POA: Insufficient documentation

## 2018-06-06 DIAGNOSIS — L97411 Non-pressure chronic ulcer of right heel and midfoot limited to breakdown of skin: Secondary | ICD-10-CM

## 2018-06-06 DIAGNOSIS — J439 Emphysema, unspecified: Secondary | ICD-10-CM | POA: Insufficient documentation

## 2018-06-06 DIAGNOSIS — J449 Chronic obstructive pulmonary disease, unspecified: Secondary | ICD-10-CM | POA: Diagnosis not present

## 2018-06-09 ENCOUNTER — Encounter: Payer: Self-pay | Admitting: Podiatry

## 2018-06-09 NOTE — Progress Notes (Signed)
Subjective: [name] today for follow up of diabeticulceration rightfoot.  He is wearing his diabetic shoes on today and says he has been complying wearing them daily.  He states he has been following my instructions and feels his foot is doing good. He has been applying Iodosorb to ulcer.   For his tinea pedis, he has been using his Loprox Cream as instructed and feels his feet look better as well.   He denies fever, chills Deniesany worsening ofulceration.  Objective: AAO 3, NAD DP/PT pulses palpable, CRT less than 3 seconds Protective sensationdecreasedwith Simms Weinstein monofilament Nails hypertrophic, dystrophic, elongated, brittle, discolored 10. Toenails today are adequate length.  Preulcerative calluslocated submetatarsal head 3 right foot.No flocculence. No perilesion erythema. No lymphangitis noted. No probing to bone, no undermining, no active pus or purulence noted.Area measures 3.0 x 2.5 cm and is not open.  Diffuse scaling noted plantarly and peripherally, improved, but not resolved. No foot odor, no blisters.   Mild maceration noted 4th webspace b/l.  No indications for new xrays today. Xrays right footon 05/09/18: +Vessel calcifications Cortices of metatarsals remain intact with no evidence of bony erosion  Assessment: 1. Preulcerative callus submet 3 right foot 3. Tinea pedis b/l (interdigital and peripheral cutaneous) 4. NIDDM with peripheral neuropathy  Plan: 1.[name]  is to continue to wear his diabetic shoes daily.  2.Preulcerative lesionreactive hyperkeratoses was debrided. Preulcerative callus debrided. Area cleansed with wound cleanser. Iodosorb gel and bandaid applied. 3. He may shower, but dry foot well afterwards and continue Iodosorb and bandaid. 4. No clinical signs of infection noted today. No need for continued oral antibiotics. 5. For tinea pedis: Contiue Loprox Cream 0.77% to be applied to both  feet twice daily for two more weeks. Continue Nystatin Powder between 4th and 5th digits once daily. 6.He  is to report to the ER if he notices any changes in his feet such as redness, swelling, pus, drainage, odor, or pain. Also, report to ER if he experiences any  fever, chills, nightsweats, nausea, vomiting, or increased/low blood sugars. He related understanding of today's instructions. 7. Follow up 3 weeks.

## 2018-06-11 ENCOUNTER — Telehealth: Payer: Self-pay | Admitting: Medical Oncology

## 2018-06-11 NOTE — Telephone Encounter (Signed)
asking if pt is appropriate referral to oncology for workup of ? thymoma or thymic hyperplasia. Sent to [facility].

## 2018-06-12 ENCOUNTER — Telehealth: Payer: Self-pay | Admitting: Allergy

## 2018-06-12 NOTE — Telephone Encounter (Signed)
Dr. Rogue Jury office called stating he suggest Richard [name] be seen by a thoracic surgeon.

## 2018-06-12 NOTE — Telephone Encounter (Signed)
Please place a referral to Thoracic surgery for evaluation of possible thymoma vs thymic hyperplasia.    Please let pt know we will be placing a referral for thoracic surgery for evaluation of the soft tissue mass for definitive diagnosis and management if needed.   I discussed chest CT results (showing he does have emphysema however incidental finding of soft tissue mass found in area of thymus was found) with pt on Friday and he is aware of the results and I advised we would let him know where we would be referring him to once I identified the appropriate specialist.

## 2018-06-12 NOTE — Telephone Encounter (Signed)
Please refer patient as instructed.

## 2018-06-12 NOTE — Telephone Encounter (Signed)
Per Dr Julien Nordmann he recommends pt see Thoracic surgeon.

## 2018-06-16 NOTE — Telephone Encounter (Signed)
Referral has been placed in [place].

## 2018-06-16 NOTE — Telephone Encounter (Signed)
Thank you so much for your help with this!

## 2018-06-17 NOTE — Telephone Encounter (Signed)
Great - thanks

## 2018-06-19 DIAGNOSIS — J45901 Unspecified asthma with (acute) exacerbation: Secondary | ICD-10-CM | POA: Diagnosis not present

## 2018-06-20 ENCOUNTER — Ambulatory Visit (INDEPENDENT_AMBULATORY_CARE_PROVIDER_SITE_OTHER): Payer: Medicare Other | Admitting: Dietician

## 2018-06-20 DIAGNOSIS — IMO0002 Reserved for concepts with insufficient information to code with codable children: Secondary | ICD-10-CM

## 2018-06-20 DIAGNOSIS — E1165 Type 2 diabetes mellitus with hyperglycemia: Secondary | ICD-10-CM | POA: Diagnosis not present

## 2018-06-20 DIAGNOSIS — E1139 Type 2 diabetes mellitus with other diabetic ophthalmic complication: Secondary | ICD-10-CM

## 2018-06-20 DIAGNOSIS — Z713 Dietary counseling and surveillance: Secondary | ICD-10-CM | POA: Diagnosis not present

## 2018-06-20 NOTE — Patient Instructions (Addendum)
See you next week :)

## 2018-06-20 NOTE — Progress Notes (Signed)
  Medical Nutrition Therapy:  Appt start time: 0910 end time:  0940. Visit # 2nd this year, 9 in total  Assessment:  Primary concerns today: blood sugar control- he'd like his A1C to be a "6" and blood sugar mostly 90-100.  He is content with his weight. Has quit smoking again now for 8 months. He has had an increased appetite sincee quitting. Still attending [employer] Mnnday through Thursday for his GED.  Preferred Learning Style: Auditory,Visual,Hands on not math or numbers Learning Readiness: he seems to be oscillating between Contemplating and ready to change food choices.  Usual physical activity: foot wound is now healed and he was just given the okay to do weight bearing activity but is not sure about water activity yet. ANTHROPOMETRICS: Estimated body mass index is 28.92 kg/m as calculated from the following:   Height as of 05/30/18: 6\' 3"  (1.905 m).   Weight as of 05/30/18: 231 lb 6.4 oz (105 kg).  WEIGHT HISTORY: Wt Readings from Last 5 Encounters:  05/30/18 231 lb 6.4 oz (105 kg)  05/22/18 236 lb 14.4 oz (107.5 kg)  02/21/18 221 lb 9.6 oz (100.5 kg)  01/22/18 216 lb 11.2 oz (98.3 kg)  01/17/18 213 lb 3.2 oz (96.7 kg)   SLEEP:need to assess at future visit Dining Out (times/week): does not like to eat out MEDICATIONS: metformin, jardiance, Trulicity BLOOD SUGAR: not done today- he did not bring his meter, he says blood sugars range at home from 123-199 Lab Results  Component Value Date   HGBA1C 9.0 (A) 05/22/2018   DIETARY INTAKE: Usual eating pattern- not assessed today. He does his own shopping and cooking Everyday foods include chips, popcorn,fried foods, nuts, steak, french fries, chicken wings   24-hr recall: feels he is eating the right things(somewhat) but too much- rice, chips, fried foods, nuts with raisins and sugar and salt  Progress Towards Goal(s):  In progress.   Nutritional Diagnosis:  NI-5.8.2 Excessive carbohydrate intake As related to increased  appetite/quitting smoking.  As evidenced by his report and weight gain of almost 20#.    Intervention:  Nutrition education and counseling about his goals and barriers to achieving those goals. Education/reveiw about carb content of foods/ lower carb foods and portion sizes. Tips to help him eat less carbs without readings labels or measuring per his request Coordination of care: consider Ozempic vs victoza instead of trulicity for weight loss/glycemic control if patient agrees. Also could use CGM to show patient affects of food choices if patient agrees.   Teaching Method Utilized: Visual, Auditory, Hands on Handouts given during visit include:ready set starch counting ( carbs) best foods to eat for diabetes and meal planing and carb counting book [facility] for care Barriers to learning/adherence to lifestyle change: competing values Demonstrated degree of understanding via:  Teach Back   Monitoring/Evaluation:  Dietary intake, exercise, meter, and body weight in 1 week(s). [name], RD 06/20/2018 9:55 AM.

## 2018-06-23 ENCOUNTER — Other Ambulatory Visit: Payer: Self-pay | Admitting: Internal Medicine

## 2018-06-23 NOTE — Telephone Encounter (Signed)
#  90 with 5 refills sent 05/20/2018. Call to pharmacy. Confirmed with Pharmacist they have this Rx and will get ready for patient. Hubbard Hartshorn, RN, BSN

## 2018-06-23 NOTE — Telephone Encounter (Signed)
Needs refill on pregabalin (LYRICA) 100 MG capsule on [facility] on [address]  ;pt contact# [phone]

## 2018-06-25 NOTE — Telephone Encounter (Signed)
I did check on the referral and it is in the right WQ. The referral coordinator will give the patient a call.

## 2018-06-27 ENCOUNTER — Encounter: Payer: Self-pay | Admitting: Podiatry

## 2018-06-27 ENCOUNTER — Ambulatory Visit: Payer: Medicare Other | Admitting: Dietician

## 2018-06-27 ENCOUNTER — Ambulatory Visit (INDEPENDENT_AMBULATORY_CARE_PROVIDER_SITE_OTHER): Payer: Medicare Other | Admitting: Podiatry

## 2018-06-27 DIAGNOSIS — L97511 Non-pressure chronic ulcer of other part of right foot limited to breakdown of skin: Secondary | ICD-10-CM | POA: Diagnosis not present

## 2018-06-27 DIAGNOSIS — B353 Tinea pedis: Secondary | ICD-10-CM

## 2018-06-27 DIAGNOSIS — E08621 Diabetes mellitus due to underlying condition with foot ulcer: Secondary | ICD-10-CM

## 2018-06-27 MED ORDER — CICLOPIROX OLAMINE 0.77 % EX CREA
TOPICAL_CREAM | Freq: Two times a day (BID) | CUTANEOUS | 0 refills | Status: AC
Start: 2018-06-27 — End: 2018-07-11

## 2018-06-27 NOTE — Progress Notes (Signed)
Subjective: Richard [name] today forfollow up of diabetic preulcerative lesion rightfoot. He states his foot is "doing great".  He states he continues to wear his diabetic shoes daily and realizes his compliance plays a role in saving his feet.  Regarding the tinea pedis, he is applying Nystatin Powder to his webspaces, but is out of his Loprox Cream.  He voices no other pedal concerns on today's visit.  Objective: AAO 3, NAD DP/PT pulses palpable, CRT less than 3 seconds Protective sensationdecreasedwith Simms Weinstein monofilament Nails hypertrophic, dystrophic, elongated, brittle, discolored 10. Toenails today are adequate length.  Preulcerative calluslocated submetatarsal head 3 right foot.No flocculence. No perilesional erythema.No tenderness to palpation. No flocculence. Nolymphangitis noted. No probing to bone, no undermining, no active pus or purulence noted.Area measures 3.0 x 2.5 cm and is not open.   Diffuse scaling noted plantarly and peripherally, improved from last visit, but not completely resolved. No foot odor, no blisters.   Mild maceration noted 4th webspace b/l.  No indications for new xrays today. Xrays right footon 05/09/18: +Vessel calcifications Cortices of metatarsals remain intact with no evidence of bony erosion  Assessment: 1. Preulcerative callus submet 3 right foot 3. Tinea pedis b/l(interdigital and peripheral cutaneous) 4. NIDDM with peripheral neuropathy  Plan: 1.Richard [name]  is to continueto wear his diabetic shoesdaily. 2.Preulcerative lesionreactive hyperkeratoses was debrided.Preulcerative callus debrided. He may shower, but dry foot well afterwards. 4. No clinical signs of infection noted today. No need for oral antibiotics. 5.For tinea pedis:RefillLoprox Cream 0.77% to be applied to both feettwice daily for two more weeks.Continue Nystatin Powder between 4th and 5th digits once daily. 6.He  is  to report to the ER if he notices any changes in his feet such as redness, swelling, pus, drainage, odor, or pain. Also, report to ER if he experiences any  fever, chills, nightsweats, nausea, vomiting, or increased/low blood sugars. He related understanding of today's instructions. 7. Follow up3 weeks. If he is doing well next visit, we will stretch him out an additional week. We are currently trying to find an appointment window for him to avoid breakdown of his ulcer.  8. Richard [name] is doing complying well with his diabetic shoe wear at present.

## 2018-07-03 NOTE — Telephone Encounter (Signed)
Patient is scheduled for 07/17/2018

## 2018-07-04 ENCOUNTER — Ambulatory Visit: Payer: Medicare Other | Admitting: Podiatry

## 2018-07-07 ENCOUNTER — Encounter: Payer: Self-pay | Admitting: Dietician

## 2018-07-07 ENCOUNTER — Ambulatory Visit (INDEPENDENT_AMBULATORY_CARE_PROVIDER_SITE_OTHER): Payer: Medicare Other | Admitting: Dietician

## 2018-07-07 DIAGNOSIS — Z794 Long term (current) use of insulin: Secondary | ICD-10-CM | POA: Diagnosis not present

## 2018-07-07 DIAGNOSIS — E118 Type 2 diabetes mellitus with unspecified complications: Secondary | ICD-10-CM | POA: Diagnosis not present

## 2018-07-07 DIAGNOSIS — IMO0002 Reserved for concepts with insufficient information to code with codable children: Secondary | ICD-10-CM

## 2018-07-07 DIAGNOSIS — E1139 Type 2 diabetes mellitus with other diabetic ophthalmic complication: Secondary | ICD-10-CM | POA: Diagnosis not present

## 2018-07-07 DIAGNOSIS — Z713 Dietary counseling and surveillance: Secondary | ICD-10-CM | POA: Diagnosis not present

## 2018-07-07 DIAGNOSIS — E1165 Type 2 diabetes mellitus with hyperglycemia: Principal | ICD-10-CM

## 2018-07-07 NOTE — Progress Notes (Signed)
  Medical Nutrition Therapy:  Appt start time: 0815 end time:  0900. Visit # 3rd this year  Assessment:  Primary concerns today: blood sugar control-.  Feels he is active enough. Wants to change what he is eating to lower his blood sugars enough so that he does not need insulin. He is still working on trying to eat more of the right foods and smaller portions of favorite higher carb foods. ANTHROPOMETRICS: not done today  SLEEP:need to assess  MEDICATIONS: taking as directed BLOOD SUGAR: 204 after a doughnut and coffee, he brought his meter today- average for 90,30 days was 198 with 10 readings. The pattern is highest fasting and mid 100s later in the day Lab Results  Component Value Date   HGBA1C 9.0 (A) 05/22/2018   DIETARY INTAKE: Usual eating pattern- says he thinks his fasting sugar is high because of the things he eats before bed like sandwich, chips, fruit. He does his own shopping and cooking and is going to day with his girlfriend to shop for lower carb foods Everyday foods include chips, nuts, eggs, cheese,grilled chicken salad, tomatoes, cucumbers  Progress Towards Goal(s):  In progress.   Nutritional Diagnosis:  NI-5.8.2 Excessive carbohydrate intake As related to increased appetite/quitting smoking.  As evidenced by his report and weight gain of almost 20#.    Intervention:  Nutrition education and counseling about his goals and barriers to achieving those goals. Education/reveiw about carb content of foods/ lower carb foods and portion sizes. Tips to help him eat less carbs without readings labels or measuring per his request Coordination of care: consider Ozempic vs victoza instead of trulicity for weight loss/glycemic control if not at goal in November.  CGM if patient  Agrees to show affects of food choices.  Handouts given during visit include:AVS Barriers to learning/adherence to lifestyle change: competing values Demonstrated degree of understanding via:  Teach Back    Monitoring/Evaluation:  Dietary intake, exercise, meter, and body weight in 3 week(s). Butch Penny Orest Dygert, RD 07/07/2018 9:04 AM.

## 2018-07-07 NOTE — Patient Instructions (Signed)
Shop and eat lower carb foods more often.  Check blood sugar 3 times a day every day to see how this helps my blood sugar.  Butch Penny [phone]

## 2018-07-17 ENCOUNTER — Encounter: Payer: Medicare Other | Admitting: Cardiothoracic Surgery

## 2018-07-18 ENCOUNTER — Encounter: Payer: Self-pay | Admitting: Cardiothoracic Surgery

## 2018-07-19 DIAGNOSIS — J45901 Unspecified asthma with (acute) exacerbation: Secondary | ICD-10-CM | POA: Diagnosis not present

## 2018-07-25 ENCOUNTER — Ambulatory Visit (INDEPENDENT_AMBULATORY_CARE_PROVIDER_SITE_OTHER): Payer: Medicare Other | Admitting: Podiatry

## 2018-07-25 ENCOUNTER — Encounter: Payer: Self-pay | Admitting: Podiatry

## 2018-07-25 DIAGNOSIS — B353 Tinea pedis: Secondary | ICD-10-CM | POA: Diagnosis not present

## 2018-07-25 DIAGNOSIS — L97411 Non-pressure chronic ulcer of right heel and midfoot limited to breakdown of skin: Secondary | ICD-10-CM

## 2018-07-25 DIAGNOSIS — E08621 Diabetes mellitus due to underlying condition with foot ulcer: Secondary | ICD-10-CM | POA: Diagnosis not present

## 2018-07-27 MED ORDER — NYSTATIN 100000 UNIT/GM EX POWD: CUTANEOUS | 0 refills | Status: DC

## 2018-07-27 NOTE — Progress Notes (Signed)
Subjective: Richard [name] today forfollow up of diabetic preulcerative lesionrightfoot. He feels his foot continues to do well.  He is still wearing his diabetic shoesdaily.  Regarding the tinea pedis, he is applying Nystatin Powder to his webspaces.   He voices no other pedal concerns on today's visit.  Objective: AAO 3, NAD DP/PT pulses palpable, CRT less than 3 seconds Protective sensationdecreasedwith Simms Weinstein monofilament Nails hypertrophic, dystrophic, elongated, brittle, discolored 10. Toenails today are adequate length.  Preulcerative calluslocated submetatarsal head 3 right foot.No flocculence. No perilesional erythema.No tenderness to palpation. No flocculence. Nolymphangitis noted. No probing to bone, no undermining, no active pus or purulence noted.Area measures 3.0 x 2.5 cm and remains closed.  Diffuse scaling completely resolved.No foot odor, no blisters.  Interdigital maceration remains right 4th digit; cleared 4th webspace left foot.  No indications for new xrays today. Xrays right footon 05/09/18: +Vessel calcifications Cortices of metatarsals remain intact with no evidence of bony erosion  Assessment: 1. Preulcerative callus submet 3 right foot 3. Tinea pedis 4th webspace right foot 4. NIDDM with peripheral neuropathy  Plan: 1.Richard [name] is to continueto wear his diabetic shoesdaily. 2.  Preulcerative lesionreactive hyperkeratoses was debrided. 4. No clinical signs of infection noted today. No need for oral antibiotics. 5.For tinea pedis:RefillNystatin Powder between 4th and 5th digits right foot once daily. 6.He is to reportto the ER if he notices any changes in his feet such as redness, swelling, pus, drainage, odor, or pain. Also, report to ER if he experiences any fever, chills, nightsweats, nausea, vomiting, or increased/low blood sugars. He related understanding of today's instructions. 7. We will  extend his appointment an additionalweek. Follow up 4 weeks. We are currently trying to find an appointment window for him to avoid breakdown of his ulcer.  8. Richard [name] is doing complying well with his diabetic shoe wear at present.

## 2018-08-01 ENCOUNTER — Ambulatory Visit: Payer: Medicare Other | Admitting: Dietician

## 2018-08-01 ENCOUNTER — Encounter: Payer: Self-pay | Admitting: Internal Medicine

## 2018-08-01 ENCOUNTER — Other Ambulatory Visit: Payer: Self-pay | Admitting: Allergy

## 2018-08-01 DIAGNOSIS — J4550 Severe persistent asthma, uncomplicated: Secondary | ICD-10-CM

## 2018-08-19 DIAGNOSIS — J45901 Unspecified asthma with (acute) exacerbation: Secondary | ICD-10-CM | POA: Diagnosis not present

## 2018-08-20 ENCOUNTER — Institutional Professional Consult (permissible substitution) (INDEPENDENT_AMBULATORY_CARE_PROVIDER_SITE_OTHER): Payer: Medicare Other | Admitting: Cardiothoracic Surgery

## 2018-08-20 ENCOUNTER — Other Ambulatory Visit: Payer: Self-pay

## 2018-08-20 ENCOUNTER — Encounter: Payer: Self-pay | Admitting: Cardiothoracic Surgery

## 2018-08-20 VITALS — BP 105/69 | HR 83 | Resp 16 | Ht 75.0 in | Wt 230.0 lb

## 2018-08-20 DIAGNOSIS — J9859 Other diseases of mediastinum, not elsewhere classified: Secondary | ICD-10-CM

## 2018-08-20 DIAGNOSIS — L97519 Non-pressure chronic ulcer of other part of right foot with unspecified severity: Secondary | ICD-10-CM

## 2018-08-20 DIAGNOSIS — E11621 Type 2 diabetes mellitus with foot ulcer: Secondary | ICD-10-CM | POA: Diagnosis not present

## 2018-08-20 DIAGNOSIS — E1140 Type 2 diabetes mellitus with diabetic neuropathy, unspecified: Secondary | ICD-10-CM | POA: Diagnosis not present

## 2018-08-20 NOTE — Progress Notes (Signed)
PCP is [name], DO Referring Provider is Juanita Craver*  Chief Complaint  Patient presents with  . Mediastinal Mass    eval for thymoma vs. thymic hyperplasia.Marland KitchenMarland KitchenCT CHEST 06/06/18.Marland KitchenMarland KitchenPFT in 2017    HPI: Patient examined, CT scan of chest images performed August 2019 personally reviewed and discussed with patient.  57 year old diabetic reformed smoker was evaluated by his primary care physician for shortness of breath with PFTs which showed moderate-severe restrictive lung disease and CT scan of the chest.  CT scan of the chest demonstrated no suspicious pulmonary nodules with some changes of COPD.  There was an incidental finding of a 1.4 cm superior mediastinal nodule, mainly round and smooth contoured.  There are no previous chest CT scans with which to compare.  Patient has been a smoker until 8 months ago and now he has completely stop.  He denies hemoptysis, weight change, night sweats, or chest pain.  He does report problems with reflux, dyspnea on exertion, and neuropathy from his diabetes.  This nodular density probably represents residual thymic tissue versus an mediastinal node.  I would not recommend biopsy or resection at this time.  I would not recommend a PET scan because the area of concern is so small.  I have recommended to the patient a surveillance follow-up CT scan in 6 months to document any growth.  If the area does progress in size then a PET scan followed by resection would probably be indicated.  Patient understands the importance of total smoking cessation.  Past Medical History:  Diagnosis Date  . Asthma   . Clostridium difficile colitis 09/09/2014  . COPD (chronic obstructive pulmonary disease) ([place])   . Diabetes mellitus without complication ([place])    diagnosed in 2013  . Eczema   . Essential hypertension   . Glaucoma    bilateral, surgery on left eye but not right  . Hyperlipidemia   . Neuromuscular disorder ([facility])    neuropathy in feet  . 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 APPENDECTOMY    . CARDIAC CATHETERIZATION N/A 09/26/2015   Procedure: Left Heart Cath and Coronary Angiography;  Surgeon: [name], MD;  Location: [facility];  Service: Cardiovascular;  Laterality: N/A;  . left eye surgery Left    glaucoma  . SKIN GRAFT      Family History  Problem Relation Age of Onset  . Hypertension Mother   . Hypertension Sister   . Glaucoma Sister   . Colon cancer Neg Hx   . Colon polyps Neg Hx   . Esophageal cancer Neg Hx   . Rectal cancer Neg Hx   . Stomach cancer Neg Hx     Social History Social History   Tobacco Use  . Smoking status: Former Smoker    Packs/day: 1.00    Years: 45.00    Pack years: 45.00    Types: Cigarettes    Last attempt to quit: 01/12/2018    Years since quitting: 0.6  . Smokeless tobacco: Former Systems developer    Quit date: 08/24/2016  Substance Use Topics  . Alcohol use: No    Alcohol/week: 0.0 standard drinks  . Drug use: No    Current Outpatient Medications  Medication Sig Dispense Refill  . ACCU-CHEK FASTCLIX LANCETS MISC Check blood sugar two times a day 204 each 5  . albuterol (  PROAIR HFA) 108 (90 Base) MCG/ACT inhaler Inhale 2 puffs into the lungs every 4 (four) hours as needed for wheezing or shortness of breath.    Marland Kitchen albuterol (PROVENTIL) (2.5 MG/3ML) 0.083% nebulizer solution Take 3 mLs (2.5 mg total) by nebulization every 6 (six) hours as needed for wheezing or shortness of breath. 75 mL 11  . Artificial Tear Ointment (DRY EYES OP) Place 1-2 drops into both eyes every 6 (six) hours as needed (for dry eyes).    Marland Kitchen aspirin EC 81 MG EC tablet Take 1 tablet (81 mg total) by mouth daily. 30 tablet 3  . atorvastatin (LIPITOR) 40 MG tablet Take 1 tablet (40 mg total) by mouth daily. 90 tablet 3  . Blood Glucose Monitoring Suppl (ACCU-CHEK GUIDE) w/Device KIT 1 each by Does not apply  route 2 (two) times daily. 1 kit 1  . cetirizine (ZYRTEC) 10 MG tablet Take 1 tablet (10 mg total) by mouth daily. 30 tablet 5  . diclofenac sodium (VOLTAREN) 1 % GEL Apply 2-4 g topically 4 (four) times daily. Apply 2g to right shoulder, 4 grams to left knee. 100 g 3  . Dulaglutide (TRULICITY) 1.5 ZR/0.0TM SOPN Inject 1.5 mg into the skin once a week. (Patient taking differently: Inject 1.5 mg into the skin every Friday. ) 12 pen 3  . empagliflozin (JARDIANCE) 10 MG TABS tablet Take 10 mg by mouth daily. 90 tablet 3  . glucose blood (ACCU-CHEK GUIDE) test strip Check blood sugar two times a day 200 each 5  . levocetirizine (XYZAL) 5 MG tablet Take 1 tablet (5 mg total) by mouth every evening. 90 tablet 3  . metFORMIN (GLUCOPHAGE) 1000 MG tablet Take 1 tablet (1,000 mg total) by mouth 2 (two) times daily with a meal. 180 tablet 3  . mometasone-formoterol (DULERA) 200-5 MCG/ACT AERO Inhale 2 puffs into the lungs 2 (two) times daily. 13 g 3  . montelukast (SINGULAIR) 10 MG tablet TAKE 1 TABLET(10 MG) BY MOUTH AT BEDTIME 30 tablet 2  . Multiple Vitamins-Minerals (MULTIVITAMIN ADULT PO) Take 1 tablet by mouth daily.    Marland Kitchen nystatin (MYCOSTATIN/NYSTOP) powder Apply between 4th and 5th digits of both feet once daily. 30 g 0  . pregabalin (LYRICA) 100 MG capsule Take 1 capsule (100 mg total) by mouth 3 (three) times daily. 90 capsule 5  . timolol (TIMOPTIC) 0.5 % ophthalmic solution Place 1 drop into both eyes 2 (two) times daily.  10  . triamcinolone ointment (KENALOG) 0.1 % Apply 1 application topically 2 (two) times daily as needed. 80 g 5   No current facility-administered medications for this visit.     No Known Allergies  Review of Systems   Normal coronaries by cath 2016 with LVEDP of 18 Echocardiogram shows annular calcification of the mitral valve without significant mitral regurgitation.   No history of thoracic trauma No history of exposure to TB No difficulty swallowing No neurologic  symptoms of specific weakness.  BP 105/69 (BP Location: Right Arm, Patient Position: Sitting, Cuff Size: Large)   Pulse 83   Resp 16   Ht '6\' 3"'$  (1.905 m)   Wt 230 lb (104.3 kg)   SpO2 96% Comment: ON RA  BMI 28.75 kg/m  Physical Exam      Exam    General- alert and comfortable    Neck- no JVD, no cervical adenopathy palpable, no carotid bruit   Lungs- clear without rales, wheezes   Cor- regular rate and rhythm, no murmur , gallop  Abdomen- soft, non-tender   Extremities - warm, non-tender, minimal edema   Neuro- oriented, appropriate, no focal weakness   Diagnostic Tests: CT scan images personally reviewed and discussed with patient showing a 1.4 cm nodular density in the superior mediastinum probably representing residual thymic tissue versus lymph node.  Doubt this to be a malignancy but patient will need a surveillance scan in 6 months.  Impression: Probable benign mediastinal nodular density-lymph node versus residual thymic tissue  Plan: Return with CT scan in 6 months   Len Childs, MD [facility] [phone]

## 2018-08-21 ENCOUNTER — Ambulatory Visit: Payer: Medicare Other | Admitting: Dietician

## 2018-08-21 ENCOUNTER — Ambulatory Visit (INDEPENDENT_AMBULATORY_CARE_PROVIDER_SITE_OTHER): Payer: Medicare Other | Admitting: Internal Medicine

## 2018-08-21 ENCOUNTER — Other Ambulatory Visit: Payer: Self-pay

## 2018-08-21 ENCOUNTER — Encounter: Payer: Self-pay | Admitting: Internal Medicine

## 2018-08-21 VITALS — BP 127/76 | HR 82 | Temp 98.1°F | Ht 75.0 in | Wt 238.4 lb

## 2018-08-21 DIAGNOSIS — E1151 Type 2 diabetes mellitus with diabetic peripheral angiopathy without gangrene: Secondary | ICD-10-CM | POA: Diagnosis not present

## 2018-08-21 DIAGNOSIS — I739 Peripheral vascular disease, unspecified: Secondary | ICD-10-CM

## 2018-08-21 DIAGNOSIS — Z7984 Long term (current) use of oral hypoglycemic drugs: Secondary | ICD-10-CM

## 2018-08-21 DIAGNOSIS — Z8631 Personal history of diabetic foot ulcer: Secondary | ICD-10-CM

## 2018-08-21 DIAGNOSIS — E118 Type 2 diabetes mellitus with unspecified complications: Secondary | ICD-10-CM | POA: Diagnosis not present

## 2018-08-21 DIAGNOSIS — H6123 Impacted cerumen, bilateral: Secondary | ICD-10-CM

## 2018-08-21 DIAGNOSIS — J9859 Other diseases of mediastinum, not elsewhere classified: Secondary | ICD-10-CM

## 2018-08-21 DIAGNOSIS — E1165 Type 2 diabetes mellitus with hyperglycemia: Secondary | ICD-10-CM | POA: Diagnosis not present

## 2018-08-21 DIAGNOSIS — E1139 Type 2 diabetes mellitus with other diabetic ophthalmic complication: Secondary | ICD-10-CM

## 2018-08-21 DIAGNOSIS — I10 Essential (primary) hypertension: Secondary | ICD-10-CM

## 2018-08-21 DIAGNOSIS — IMO0002 Reserved for concepts with insufficient information to code with codable children: Secondary | ICD-10-CM

## 2018-08-21 DIAGNOSIS — H6120 Impacted cerumen, unspecified ear: Secondary | ICD-10-CM | POA: Insufficient documentation

## 2018-08-21 LAB — POCT GLYCOSYLATED HEMOGLOBIN (HGB A1C): Hemoglobin A1C: 9.2 % — AB (ref 4.0–5.6)

## 2018-08-21 LAB — GLUCOSE, CAPILLARY: GLUCOSE-CAPILLARY: 137 mg/dL — AB (ref 70–99)

## 2018-08-21 MED ORDER — PIOGLITAZONE HCL 15 MG PO TABS: 15.0000 mg | ORAL_TABLET | Freq: Every day | ORAL | 3 refills | Status: DC

## 2018-08-21 NOTE — Patient Instructions (Signed)
I am starting you on Pioglitazone 15mg  daily for your diabetes.

## 2018-08-22 ENCOUNTER — Ambulatory Visit: Payer: Medicare Other | Admitting: Podiatry

## 2018-08-25 DIAGNOSIS — J9859 Other diseases of mediastinum, not elsewhere classified: Secondary | ICD-10-CM | POA: Insufficient documentation

## 2018-08-25 NOTE — Progress Notes (Signed)
Subjective:  HPI: [name] is a 57 y.o. male who presents for f/u T2DM  Please see Assessment and Plan below for the status of his chronic medical problems.  Review of Systems: Review of Systems  Constitutional: Negative for chills, fever, malaise/fatigue and weight loss.  HENT: Positive for hearing loss. Negative for ear discharge, ear pain and tinnitus.   Eyes: Negative for blurred vision.  Respiratory: Negative for cough.   Cardiovascular: Negative for chest pain.  Genitourinary: Negative for dysuria.  Neurological: Negative for focal weakness.  Endo/Heme/Allergies: Negative for polydipsia.    Objective:  Physical Exam: Vitals:   08/21/18 0901  BP: 127/76  Pulse: 82  Temp: 98.1 F (36.7 C)  TempSrc: Oral  SpO2: 100%  Weight: 238 lb 6.4 oz (108.1 kg)  Height: 6\' 3"  (1.905 m)   Body mass index is 29.8 kg/m. Physical Exam  Constitutional: He appears well-developed and well-nourished.  HENT:  Bilateral cerumen impaction, unable to visualize TM  Cardiovascular: Normal rate, regular rhythm and normal heart sounds. Exam reveals decreased pulses.  Pulses:      Dorsalis pedis pulses are 0 on the right side, and 1+ on the left side.       Posterior tibial pulses are 1+ on the right side, and 1+ on the left side.  Pulmonary/Chest: Effort normal and breath sounds normal. He has no wheezes.  Abdominal: Soft. Bowel sounds are normal.  Musculoskeletal:  Dry skin bilateral feet, healed would at base of 3rd right metatarsal   Assessment & Plan:  Cerumen impaction HPI:  Patient concerned ears are impacted again, has had this recurrently, feels hearing might be sighltly decreased.  Has been using debrox, he has stopped using any q-tips.    A: Bilateral cerumen impaction  P: -Referral to ENT  Mediastinal mass HPI: He has a CT scan a few months ago which revealed an incidental  1.4cm mediastinal mass, he saw Dr Prescott Gum yesterday who reviewed the CT scan, rec again  biopsy at this time, will repeat CT chest in 6 months to ensure stability.  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    A: Uncontrolled Type 2 DM with peripheral vascular disease  P: Continue jardiance 10mg  daily Continue trulicity 1.5mg  weekly Continue Metformin 1g BID  Wants to avoid insulin Will add Actos 15mg  daily, discussed possibility of weight gain and association with CHF exacerbations.  He had abnormal TBI bilaterally 1 year ago, will repeat ABIs  Essential hypertension A: Essential HTN  P: well controlled on ly jardiance 10mg  daily.  Peripheral arterial disease (HCC) HPI: had right foot ulcer that has finnally healed after prolonged course.  However he does have PAD, ABIs with TBI were abnormal in Jan.    A: PAD  P: Repeat ABI with TBI.   Medications Ordered Meds ordered this encounter  Medications  . pioglitazone (ACTOS) 15 MG tablet    Sig: Take 1 tablet (15 mg total) by mouth daily.    Dispense:  90 tablet    Refill:  3   Other Orders Orders Placed This Encounter  Procedures  . Glucose, capillary  . Ambulatory referral to ENT    Referral Priority:   Routine    Referral Type:   Consultation    Referral Reason:   Specialty Services Required    Requested Specialty:  Otolaryngology    Number of Visits Requested:   1  . POC Hbg A1C   Follow Up: Return in about 3 months (around 11/21/2018).

## 2018-08-25 NOTE — Assessment & Plan Note (Signed)
HPI:  Patient concerned ears are impacted again, has had this recurrently, feels hearing might be sighltly decreased.  Has been using debrox, he has stopped using any q-tips.    A: Bilateral cerumen impaction  P: -Referral to ENT

## 2018-08-25 NOTE — Assessment & Plan Note (Signed)
HPI: had right foot ulcer that has finnally healed after prolonged course.  However he does have PAD, ABIs with TBI were abnormal in Jan.    A: PAD  P: Repeat ABI with TBI.

## 2018-08-25 NOTE — Assessment & Plan Note (Addendum)
HPI: He has a CT scan a few months ago which revealed an incidental  1.4cm mediastinal mass, he saw Dr Prescott Gum yesterday who reviewed the CT scan, rec again biopsy at this time, will repeat CT chest in 6 months to ensure stability.  A: Medistinal mass  P: Repeat CT 6 months

## 2018-08-25 NOTE — Assessment & Plan Note (Signed)
A: Essential HTN  P: well controlled on ly jardiance 10mg  daily.

## 2018-08-25 NOTE — Assessment & Plan Note (Addendum)
HPI: Feels sugars have been well "doing better," no hypoglycemia.    A: Uncontrolled Type 2 DM with peripheral vascular disease  P: Continue jardiance 10mg  daily Continue trulicity 1.5mg  weekly Continue Metformin 1g BID  Wants to avoid insulin Will add Actos 15mg  daily, discussed possibility of weight gain and association with CHF exacerbations.  He had abnormal TBI bilaterally 1 year ago, will repeat ABIs

## 2018-08-29 ENCOUNTER — Ambulatory Visit (HOSPITAL_COMMUNITY)
Admission: RE | Admit: 2018-08-29 | Discharge: 2018-08-29 | Disposition: A | Discharge status: Home / Self Care | Payer: Medicare Other | Source: Ambulatory Visit | Attending: Internal Medicine | Admitting: Internal Medicine

## 2018-08-29 ENCOUNTER — Encounter (INDEPENDENT_AMBULATORY_CARE_PROVIDER_SITE_OTHER): Payer: Self-pay

## 2018-08-29 ENCOUNTER — Other Ambulatory Visit: Payer: Self-pay | Admitting: Dietician

## 2018-08-29 ENCOUNTER — Ambulatory Visit (INDEPENDENT_AMBULATORY_CARE_PROVIDER_SITE_OTHER): Payer: Medicare Other | Admitting: Dietician

## 2018-08-29 DIAGNOSIS — E1139 Type 2 diabetes mellitus with other diabetic ophthalmic complication: Secondary | ICD-10-CM

## 2018-08-29 DIAGNOSIS — H6123 Impacted cerumen, bilateral: Secondary | ICD-10-CM | POA: Diagnosis not present

## 2018-08-29 DIAGNOSIS — E1165 Type 2 diabetes mellitus with hyperglycemia: Principal | ICD-10-CM

## 2018-08-29 DIAGNOSIS — Z6829 Body mass index (BMI) 29.0-29.9, adult: Secondary | ICD-10-CM | POA: Diagnosis not present

## 2018-08-29 DIAGNOSIS — IMO0002 Reserved for concepts with insufficient information to code with codable children: Secondary | ICD-10-CM

## 2018-08-29 DIAGNOSIS — E118 Type 2 diabetes mellitus with unspecified complications: Secondary | ICD-10-CM

## 2018-08-29 DIAGNOSIS — Z713 Dietary counseling and surveillance: Secondary | ICD-10-CM

## 2018-08-29 DIAGNOSIS — H900 Conductive hearing loss, bilateral: Secondary | ICD-10-CM | POA: Insufficient documentation

## 2018-08-29 NOTE — Patient Instructions (Addendum)
The problem- what you buy at the grocery- sweets, rice, bread and soda  The possible solutions-   1- making a list and sticking to it 2- Not going down the cookie/candy and soda aisle  Think about Continuous glucose monitoring   Butch Penny [phone]

## 2018-08-29 NOTE — Progress Notes (Signed)
Please sign referral for additional MNT hours this year.

## 2018-08-29 NOTE — Progress Notes (Signed)
Spoke with Dr. Heber [place] who approved cgm placement.

## 2018-08-29 NOTE — Progress Notes (Signed)
  Medical Nutrition Therapy:  Appt start time: 0915 end time:  2130 Visit # 4 this year  Assessment:  Primary concerns today: wants to lower his blood sugar.  Mr. Widrig reports that he believes his poor food choices (sweets, soda, white bread/rice) are causing his blood sugars to be too high. He said he feels dizzy and needs to lay down after eating certain foods, such as cake, and he thinks this is related to his blood sugar being too high (measured at >300 mg/dl last incident). He does his own shopping and says he has been eating more vegetables. He also shared that his family is trying to help him by replacing certain foods with healthier choices. Mr. Farias forgot his meter today. He insist that he does not want to use insulin because everyone he has known on insulin end up with an amputation.   Learning Readiness:   Contemplating  Ready  ANTHROPOMETRICS:  Wt Readings from Last 5 Encounters:  08/21/18 238 lb 6.4 oz (108.1 kg)  08/20/18 230 lb (104.3 kg)  05/30/18 231 lb 6.4 oz (105 kg)  05/22/18 236 lb 14.4 oz (107.5 kg)  02/21/18 221 lb 9.6 oz (100.5 kg)   Estimated body mass index is 29.8 kg/m as calculated from the following:   Height as of 08/21/18: 6\' 3"  (1.905 m).   Weight as of 08/21/18: 238 lb 6.4 oz (108.1 kg).  SLEEP: not discussed today  MEDICATIONS: is taking diabetes medications as instructed; question if Trulicity is the best GLP1 for this patient; Actos added that could cause unwanted weight gain  Usual physical activity: not covered today  Progress Towards Goal(s):  No progress.   Nutritional Diagnosis:  NI-5.8.2 Excessive carbohydrate intake As related to his stated high intake of cake, candy, Pepsi, white breads and rice.  As evidenced by high blood sugar, increasing weight, and symptoms of hyperglycemia..    Intervention:  Nutrition Counseling to assist patient with problem solving, and education about CGM with plans to place one on at next visit with  physician approval Action Goal: Make a shopping list and stick to list. Avoid the aisles in grocery store that contain problem foods Outcome goal: To lower blood sugar Coordination of care: requested referral for additional MNT this year; recommend alternative GLP-1 (Ozempic vs. Victoza)  Teaching Method Utilized: Auditory Handouts given during visit include:avs Barriers to learning/adherence to lifestyle change: competing values Demonstrated degree of understanding via:  Teach Back   Monitoring/Evaluation:  Dietary intake, exercise, meter, and body weight in 2 week(s).  Debera Lat, RD 08/29/2018 11:57 AM.

## 2018-08-29 NOTE — Progress Notes (Signed)
VASCULAR LAB PRELIMINARY  ARTERIAL  ABI completed:     Right ABIs and TBIs appear increased compared to prior study on 10/18/2017.  Left ABIs and TBIs appear decreased compared to prior study on 10/18/2017.   Summary: Right  Resting right ankle-brachial index is within normal range. No evidence of significant right lower extremity arterial disease. The right toe-brachial index is normal.   Left: Resting left ankle-brachial index is within normal range. No evidence of significant left lower extremity arterial disease. The left toe-brachial index is abnormal.    RIGHT    LEFT    PRESSURE WAVEFORM  PRESSURE WAVEFORM  BRACHIAL 121 Triphasic BRACHIAL 120 Triphasic  DP 145 Triphasic DP 147 Triphasic  PT 119 Monophasic PT 156 Biphasic  GREAT TOE 93  GREAT TOE 66     RIGHT LEFT  ABI/TBI 1.20/0.77 1.29/0.55   [name]  Orlo Brickle, RVT 08/29/2018, 3:14 PM

## 2018-09-12 ENCOUNTER — Other Ambulatory Visit: Payer: Self-pay | Admitting: Allergy

## 2018-09-12 ENCOUNTER — Encounter: Payer: Self-pay | Admitting: Dietician

## 2018-09-12 ENCOUNTER — Ambulatory Visit (INDEPENDENT_AMBULATORY_CARE_PROVIDER_SITE_OTHER): Payer: Medicare Other | Admitting: Dietician

## 2018-09-12 ENCOUNTER — Ambulatory Visit (INDEPENDENT_AMBULATORY_CARE_PROVIDER_SITE_OTHER): Payer: Medicare Other | Admitting: Podiatry

## 2018-09-12 DIAGNOSIS — Z713 Dietary counseling and surveillance: Secondary | ICD-10-CM

## 2018-09-12 DIAGNOSIS — Z6829 Body mass index (BMI) 29.0-29.9, adult: Secondary | ICD-10-CM | POA: Diagnosis not present

## 2018-09-12 DIAGNOSIS — B351 Tinea unguium: Secondary | ICD-10-CM

## 2018-09-12 DIAGNOSIS — E1142 Type 2 diabetes mellitus with diabetic polyneuropathy: Secondary | ICD-10-CM | POA: Diagnosis not present

## 2018-09-12 DIAGNOSIS — M79675 Pain in left toe(s): Secondary | ICD-10-CM | POA: Diagnosis not present

## 2018-09-12 DIAGNOSIS — M79674 Pain in right toe(s): Secondary | ICD-10-CM

## 2018-09-12 DIAGNOSIS — E118 Type 2 diabetes mellitus with unspecified complications: Secondary | ICD-10-CM

## 2018-09-12 DIAGNOSIS — L84 Corns and callosities: Secondary | ICD-10-CM

## 2018-09-12 DIAGNOSIS — E11621 Type 2 diabetes mellitus with foot ulcer: Secondary | ICD-10-CM

## 2018-09-12 DIAGNOSIS — L97519 Non-pressure chronic ulcer of other part of right foot with unspecified severity: Principal | ICD-10-CM

## 2018-09-12 DIAGNOSIS — J4550 Severe persistent asthma, uncomplicated: Secondary | ICD-10-CM

## 2018-09-12 NOTE — Patient Instructions (Addendum)
Come by Monday after school about 11- 1130 to check the sensor   Please record the time, amount and what food drinks and activities you have while wearing the continuous glucose monitor(CGM).  Bring the folder with you to follow up appointments  Do not have a CT or an MRI while wearing the CGM.   Please make an appointment for 1 week with me and a doctor for the first of two CGM downloads..   You will also return in 2 weeks to have your second download and the CGM remove  Butch Penny [phone]

## 2018-09-12 NOTE — Telephone Encounter (Signed)
1 courtesy refill given of montelukast, no additional refills until pt makes apt.

## 2018-09-12 NOTE — Progress Notes (Signed)
  Medical Nutrition Therapy:  Appt start time: 1010 end time:  1100 Visit # 5 this year  Assessment:  Primary concerns today: wants to lower his blood sugar.  Mr. Buelow reports that thanksgiving made it hard to avoid sweets and control his blood sugar or to achieve his goals because his foods were not his usual. He requested professional CGM placement to help him achieve his goal of eating less sweets and control his blood sugars over the holidays. It is unclear if he made a shopping list and stuck to it. He says he was successful in avoiding the aisles with soda, chips and candy when he did shop.   CGM placed today. 15 minutes spent placing CGM and 35 minutes with MNT follow up and nutrition counseling.  Documentation for Freestyle Libre Pro Continuous glucose monitoring Freestyle Libre Pro CGM sensor placed today. Patient was educated about wearing sensor, keeping food, activity and medication log and when to call office. Patient was educated about how to care for the sensor and not to have an MRI, CT or Diathermy while wearing the sensor. Follow up was arranged with the patient for 1 week.   Lot #: [id number] A Serial #:[id number] Expiration Date:02/05/2019  Debera Lat, RD 09/12/2018 11:27 AM.   Learning Readiness: Contemplating/Ready  ANTHROPOMETRICS: He self weighed today and reported it was ~ 240# with several extra layers on.Estimated body mass index is 29.8 kg/m as calculated from the following:   Height as of 08/21/18: 6\' 3"  (1.905 m).   Weight as of 08/21/18: 238 lb 6.4 oz (108.1 kg).  SLEEP: not discussed today  MEDICATIONS: is taking diabetes medications as instructed; question if Trulicity is the best GLP1 for this patient; Actos added that could cause unwanted weight gain and edema  Usual physical activity: not covered today  Progress Towards Goal(s):  No progress.   Nutritional Diagnosis:  NI-5.8.2 Excessive carbohydrate intake As related to his stated high intake of cake,  candy, Pepsi, white breads and rice.  As evidenced by high blood sugar, increasing weight, and symptoms of hyperglycemia..    Intervention:  Nutrition Counseling to assist patient with problem solving, and education about CGM Action Goal: Make a shopping list and stick to list. Avoid the aisles in grocery store that contain problem foods Outcome goal: To lower blood sugar Teaching Method Utilized: Auditory Handouts given during visit include:avs Barriers to learning/adherence to lifestyle change: competing values Demonstrated degree of understanding via:  Teach Back   Monitoring/Evaluation:  Dietary intake, exercise, meter, and body weight in 1 week(s).  Debera Lat, RD 09/12/2018 11:18 AM.

## 2018-09-15 ENCOUNTER — Encounter: Payer: Self-pay | Admitting: Dietician

## 2018-09-15 ENCOUNTER — Other Ambulatory Visit: Payer: Self-pay | Admitting: *Deleted

## 2018-09-15 ENCOUNTER — Ambulatory Visit: Payer: Medicare Other | Admitting: Dietician

## 2018-09-15 ENCOUNTER — Telehealth: Payer: Self-pay | Admitting: *Deleted

## 2018-09-15 DIAGNOSIS — J4550 Severe persistent asthma, uncomplicated: Secondary | ICD-10-CM

## 2018-09-15 MED ORDER — MONTELUKAST SODIUM 10 MG PO TABS: ORAL_TABLET | ORAL | 5 refills | Status: DC

## 2018-09-15 NOTE — Progress Notes (Signed)
Mr. Kempner presented today to check that his CGM sensor was working properly. The sensor was scanned and showed three days of data, indicating that the sensor is functioning. He will make an appointment and return on Friday, 12/13, for week 1 download.   Butch Penny Jaxsyn Azam, RD 09/15/2018 1:16 PM.

## 2018-09-15 NOTE — Telephone Encounter (Signed)
Patient called stating a refill on allergy medications, patient states he is completely out and uses [facility] on [address]

## 2018-09-15 NOTE — Patient Instructions (Signed)
Make an appointment for this Friday with [facility] doctor and next Friday with me.  Butch Penny [phone]

## 2018-09-15 NOTE — Telephone Encounter (Signed)
Called and spoke with the patient, the patient made an appointment to follow up with Dr. Nelva Bush. Refills have been sent in for Montelukast.

## 2018-09-18 DIAGNOSIS — J45901 Unspecified asthma with (acute) exacerbation: Secondary | ICD-10-CM | POA: Diagnosis not present

## 2018-09-19 ENCOUNTER — Other Ambulatory Visit: Payer: Self-pay

## 2018-09-19 ENCOUNTER — Ambulatory Visit (INDEPENDENT_AMBULATORY_CARE_PROVIDER_SITE_OTHER): Payer: Medicare Other | Admitting: Internal Medicine

## 2018-09-19 VITALS — BP 117/70 | HR 75 | Temp 98.4°F | Ht 75.0 in | Wt 241.0 lb

## 2018-09-19 DIAGNOSIS — Z23 Encounter for immunization: Secondary | ICD-10-CM

## 2018-09-19 DIAGNOSIS — E118 Type 2 diabetes mellitus with unspecified complications: Secondary | ICD-10-CM | POA: Diagnosis not present

## 2018-09-19 DIAGNOSIS — Z1159 Encounter for screening for other viral diseases: Secondary | ICD-10-CM | POA: Diagnosis not present

## 2018-09-19 DIAGNOSIS — Z7984 Long term (current) use of oral hypoglycemic drugs: Secondary | ICD-10-CM

## 2018-09-19 DIAGNOSIS — IMO0002 Reserved for concepts with insufficient information to code with codable children: Secondary | ICD-10-CM

## 2018-09-19 DIAGNOSIS — E1139 Type 2 diabetes mellitus with other diabetic ophthalmic complication: Secondary | ICD-10-CM

## 2018-09-19 DIAGNOSIS — E1165 Type 2 diabetes mellitus with hyperglycemia: Principal | ICD-10-CM

## 2018-09-19 HISTORY — DX: Encounter for immunization: Z23

## 2018-09-19 NOTE — Patient Instructions (Signed)
Thank you for allowing [place] to care for you  For your diabetes - Your monitor shows you were at goal for 87% of the time and many of your highs were in the morning after breakfast - Continue to work on your nutrition especially your sugar intake with breakfast  You received your TDAP vaccination today  We drew labs to screen for Hepatitis C today, you will be contacted with the results  Please follow up in 1 week for repeat glucose monitor download

## 2018-09-19 NOTE — Progress Notes (Signed)
   CC: Diabetes  HPI:  [name] is a 57 y.o. M with PMHx listed below presenting for Diabetes. Please see the A&P for the status of the patient's chronic medical problems.   Richard [name] wore the CGM for 8 days. The average reading was 141, % time in target was 87, % time below target was 0, and % time above target was. 13. Intervention will be to continue with dietary modifications. The patient will be scheduled to see [facility] for a final appointment.    Past Medical History:  Diagnosis Date  . Asthma   . Clostridium difficile colitis 09/09/2014  . COPD (chronic obstructive pulmonary disease) ([place])   . Diabetes mellitus without complication ([place])    diagnosed in 2013  . Eczema   . Essential hypertension   . Glaucoma    bilateral, surgery on left eye but not right  . Hyperlipidemia   . Neuromuscular disorder (HCC)    neuropathy in feet  . 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  Physical Exam:  Vitals:   09/19/18 0907  BP: 117/70  Pulse: 75  Temp: 98.4 F (36.9 C)  TempSrc: Oral  SpO2: 100%  Weight: 241 lb (109.3 kg)  Height: 6\' 3"  (1.905 m)   Physical Exam Constitutional:      General: He is not in acute distress.    Appearance: Normal appearance.  Cardiovascular:     Rate and Rhythm: Normal rate and regular rhythm.     Heart sounds: Normal heart sounds.  Pulmonary:     Effort: Pulmonary effort is normal.     Breath sounds: Normal breath sounds.  Abdominal:     General: Bowel sounds are normal. There is no distension.     Palpations: Abdomen is soft.     Tenderness: There is no abdominal tenderness.  Musculoskeletal:        General: No swelling or tenderness.  Skin:    General: Skin is warm and dry.    Assessment & Plan:   See Encounters Tab for problem based charting.  Patient discussed with Dr. Angelia Mould

## 2018-09-19 NOTE — Assessment & Plan Note (Addendum)
Patient with uncontrolled diabetes, las A1c 9.2 on 08/21/18. At that visit Actos 15mg  Daily was added to his regimen. He has been using a CGM for the past 8 days results below. He states that with the monitor on he has been more conscious of his eating habits (in addition to working with nutritionist) and has avoided carbs and sugar except with his morning coffee. This is reflected in the results of his CGM download with the majority of his highs in the morning. He want to continue to work on this to get his sugars under better control and to avoid needing insulin.  [name] wore the CGM for 8 days. The average reading was 141, % time in target was 87, % time below target was 0, and % time above target was. 13. Intervention will be to continue with dietary modifications. The patient will be scheduled to see [facility] for a final appointment.  Given his results will continue current regimen, continue dietary modification (especially reduced sugar intake with coffee) and have him follow up in 1 week for final interpretation.  - Continue jardiance 10mg  Daily - Continue trulicity 1.5mg  Weekly - Continue Metformin 1g BID - Continue Actos 15mg  Daily - Continue Lifestyle modifications - Patient is to schedule eye exam - Follow up in 1 week

## 2018-09-20 LAB — HEPATITIS C ANTIBODY: Hep C Virus Ab: 0.1 s/co ratio (ref 0.0–0.9)

## 2018-09-22 ENCOUNTER — Encounter: Payer: Self-pay | Admitting: Dietician

## 2018-09-22 ENCOUNTER — Encounter: Payer: Self-pay | Admitting: Internal Medicine

## 2018-09-22 NOTE — Progress Notes (Signed)
Patient mailed letter informing him of his negative Hep C screen.

## 2018-09-22 NOTE — Progress Notes (Signed)
Internal Medicine Clinic Attending  Case discussed with Dr. Melvin at the time of the visit.  We reviewed the resident's history and exam and pertinent patient test results.  I agree with the assessment, diagnosis, and plan of care documented in the resident's note.  I personally reviewed the CGM data, the resident's interpretation, and agree with the intervention.  

## 2018-09-22 NOTE — Progress Notes (Signed)
Documentation of final CGM download: patient presented to the office today with his CGM sensor that had fallen off yesterday.   Richard [name] wore the CGM for 10  Days. The average reading was 139mg /dL  % time in target was 89 (target >70%) % time below target was 0 % time above target was. 11 Coefficient of Variation (CV)  22.1% (target <36) Standard Deviation (SD) mg/dL 30.7 (target <50)  His blood sugars improved the longer he wore the CGM. Suggest he wear the professional CGM again in 2-3 months(~March 2020) The patient is scheduled for a final appointment on Friday December 20.  [name], RD 09/22/2018 10:07 AM.

## 2018-09-26 ENCOUNTER — Encounter: Payer: Self-pay | Admitting: Internal Medicine

## 2018-09-26 ENCOUNTER — Other Ambulatory Visit: Payer: Self-pay

## 2018-09-26 ENCOUNTER — Encounter: Payer: Self-pay | Admitting: Dietician

## 2018-09-26 ENCOUNTER — Ambulatory Visit (INDEPENDENT_AMBULATORY_CARE_PROVIDER_SITE_OTHER): Payer: Medicare Other | Admitting: Dietician

## 2018-09-26 ENCOUNTER — Ambulatory Visit (INDEPENDENT_AMBULATORY_CARE_PROVIDER_SITE_OTHER): Payer: Medicare Other | Admitting: Internal Medicine

## 2018-09-26 VITALS — BP 114/72 | HR 83 | Temp 98.3°F | Ht 75.0 in | Wt 243.0 lb

## 2018-09-26 DIAGNOSIS — E1165 Type 2 diabetes mellitus with hyperglycemia: Principal | ICD-10-CM

## 2018-09-26 DIAGNOSIS — IMO0002 Reserved for concepts with insufficient information to code with codable children: Secondary | ICD-10-CM

## 2018-09-26 DIAGNOSIS — Z7984 Long term (current) use of oral hypoglycemic drugs: Secondary | ICD-10-CM | POA: Diagnosis not present

## 2018-09-26 DIAGNOSIS — Z79899 Other long term (current) drug therapy: Secondary | ICD-10-CM

## 2018-09-26 DIAGNOSIS — Z713 Dietary counseling and surveillance: Secondary | ICD-10-CM | POA: Diagnosis not present

## 2018-09-26 DIAGNOSIS — K5900 Constipation, unspecified: Secondary | ICD-10-CM | POA: Insufficient documentation

## 2018-09-26 DIAGNOSIS — E118 Type 2 diabetes mellitus with unspecified complications: Secondary | ICD-10-CM

## 2018-09-26 DIAGNOSIS — E1139 Type 2 diabetes mellitus with other diabetic ophthalmic complication: Secondary | ICD-10-CM

## 2018-09-26 DIAGNOSIS — E119 Type 2 diabetes mellitus without complications: Secondary | ICD-10-CM

## 2018-09-26 HISTORY — DX: Constipation, unspecified: K59.00

## 2018-09-26 MED ORDER — POLYETHYLENE GLYCOL 3350 17 G PO PACK: 17.0000 g | PACK | Freq: Every day | ORAL | 0 refills | Status: DC

## 2018-09-26 NOTE — Progress Notes (Signed)
   CC: Diabetes, CGM  HPI:   [name] is a 57 y.o. M with PMHx listed below presenting for Diabetes. Please see the A&P for the status of the patient's chronic medical problems.   [name] wore the CGM for 10 days. The average reading was 139, % time in target was 89, % time below target was 0, and % time above target was. 11. Intervention will be to continue current regimen and dietary modifications.   Past Medical History:  Diagnosis Date  . Asthma   . Clostridium difficile colitis 09/09/2014  . COPD (chronic obstructive pulmonary disease) ([place])   . Diabetes mellitus without complication ([place])    diagnosed in 2013  . Eczema   . Essential hypertension   . Glaucoma    bilateral, surgery on left eye but not right  . Hyperlipidemia   . Neuromuscular disorder (HCC)    neuropathy in feet  . 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  Physical Exam:  Vitals:   09/26/18 0942  BP: 114/72  Pulse: 83  Temp: 98.3 F (36.8 C)  TempSrc: Oral  SpO2: 99%  Weight: 243 lb (110.2 kg)  Height: 6\' 3"  (1.905 m)   Physical Exam Constitutional:      General: He is not in acute distress.    Appearance: Normal appearance.  Cardiovascular:     Rate and Rhythm: Normal rate and regular rhythm.     Heart sounds: Normal heart sounds.  Pulmonary:     Effort: Pulmonary effort is normal.     Breath sounds: Normal breath sounds.  Abdominal:     General: Bowel sounds are normal. There is no distension.     Palpations: Abdomen is soft.     Tenderness: There is no abdominal tenderness.  Musculoskeletal:        General: No swelling or tenderness.  Skin:    General: Skin is warm and dry.    Assessment & Plan:   See Encounters Tab for problem based charting.  Patient discussed with Dr. Beryle Beams

## 2018-09-26 NOTE — Assessment & Plan Note (Addendum)
Patient with a history of uncontrolled diabetes, last A1c 9.2 on 08/21/18. At that visit Actos 15mg  Daily was added to his regimen. He has been using a CGM for the past 10 days (monitor fell off a few days ). Result from past 10 days below. With the monitor on he has been paying closer attention to his eating habits and has also been working with a nutritionist. He continue to do well avoiding extra carbs and plans to buy a sugar substitute for his morning coffee today (his CGM shows highs after morning coffee).  [name] wore the CGM for 10 days. The average reading was 139, % time in target was 89, % time below target was 0, and % time above target was. 11. Intervention will be to continue current regimen and dietary modifications.          Given his results we will continue current regimen and have him follow up with his PCP. Continue dietary modifications (especially reduced sugar intake with coffee).  - Jardiance 10mg  Daily - Trulicity 1.5mg  Weekly - Metformin 1g BID - Actos 15mg  Daily - Continue Lifestyle modifications - Patient is to schedule eye exam - Follow up with PCP in February

## 2018-09-26 NOTE — Assessment & Plan Note (Signed)
Patient report > 33yr of intermittent constipation. He also endorse intermittent foul smelling belching in that time. He had to have manual disimpaction in Aug of 2018. He states he is currently having formed bowl movements every 2-3 days with diarrhea in between; His normal regimen is a daily formed BM. He was prescribed Miralax in the past which seems to have worked for him, will do a trial of this and have him return for follow up if symptoms don't improve. - Miralax 17g Daily - Return precaution given

## 2018-09-26 NOTE — Patient Instructions (Addendum)
Thank you for your visit today ad bringing your folder.   You did a great job keeping records!  Your blood sugars appear improved!  Keep this up for another 2 months- til February 14 when your next A1C id due and your next A1C should be lower than before.   Can make a follow up with me any Friday in January or February.   Can redo Continuous glucose monitoring in beginning of March 2020  Butch Penny [phone]

## 2018-09-26 NOTE — Progress Notes (Signed)
Medicine attending: Medical history, presenting problems, physical findings, and medications, reviewed with resident physician Dr [name] on the day of the patient visit and I concur with his evaluation and management plan.

## 2018-09-26 NOTE — Progress Notes (Signed)
  Medical Nutrition Therapy:  Appt start time: 0915 end time:  0945. Visit # 4th this year  Assessment:  Primary concerns today: blood sugar control and now weight loss-.  Mr. Cerney does not like the weight he has gained nor taking more medicine to lower his blood sugar.  He is working on changes to his diet to lower both. He is self directed in his behavior change.  He is planning to go to the gym while he is out of school, but says that weight bearing activity is hard for him with his foot pain.  ANTHROPOMETRICS:  SLEEP:need to assess  MEDICATIONS: taking as directed BLOOD SUGAR: CGM results discussed with him and Dr.Melvin today Usual eating pattern- three meals a day with dinner being his heaviest. However, breakfast makes the biggest rise in his blood sugars. fruit. He does his own shopping and cooking.  Everyday foods include chips, nuts, oatmeal, coffee, sandwiches, regular soda, juiceeggs, cheese,grilled chicken , rice  Progress Towards Goal(s):  Some progress.   Nutritional Diagnosis:  NI-5.8.2 Excessive carbohydrate intake As related to increased appetite/quitting smoking  As evidenced by his report and weight gain of almost 20# and increased A1C.    Intervention:  Nutrition education and counseling about his goals and barriers to achieving those goals. Education/reveiw about carb content of foods/ lower carb foods and portion sizes. Tips to help him eat less carbs without readings labels and encouragement to use physical activity as well to lwoer blood ugars.   Coordination of care: consider Ozempic vs victoza instead of trulicity for weight loss/glycemic control when patient ready.  Handouts given during visit include:AVS Barriers to learning/adherence to lifestyle change: competing values Demonstrated degree of understanding via:  Teach Back   Monitoring/Evaluation:  Dietary intake, exercise, meter, and body weight in 4 week(s). Debera Lat, RD 09/26/2018 9:44 AM.

## 2018-09-26 NOTE — Patient Instructions (Signed)
Thank you for allowing [place] to care for you  For your diabetes - Continue current medications - Keep up the good work and try to reduce morning sugar intake  For your constipation - Prescription for Miralax has been sent to your pharmacy - Com back to be seen if symptoms worsen or fail to improve  Please follow up with PCP in about 2 months

## 2018-10-03 ENCOUNTER — Other Ambulatory Visit: Payer: Self-pay | Admitting: *Deleted

## 2018-10-03 MED ORDER — EMPAGLIFLOZIN 10 MG PO TABS: 10.0000 mg | ORAL_TABLET | Freq: Every day | ORAL | 3 refills | Status: DC

## 2018-10-03 NOTE — Telephone Encounter (Signed)
Next appt scheduled 12/04/18 w/PCP.

## 2018-10-09 ENCOUNTER — Ambulatory Visit: Payer: Medicare Other | Admitting: Allergy

## 2018-10-09 DIAGNOSIS — J309 Allergic rhinitis, unspecified: Secondary | ICD-10-CM

## 2018-10-12 ENCOUNTER — Encounter: Payer: Self-pay | Admitting: Podiatry

## 2018-10-12 NOTE — Progress Notes (Signed)
Subjective: Richard [name] presents today with history of plantar diabetic ulceration right foot and diabetic neuropathy. He is seen for preventative care on today.   He states he has been complying with diabetic shoe gear/insoles and he also notices improvement with said compliance.  Patient has peripheral neuropathy managed with  [name], DO is his PCP and last dos was  08/26/2018.  No Known Allergies  Objective:  Vascular Examination: Capillary refill time <3 seconds x 10 digits Dorsalis pedis and Posterior tibial pulses are palpable b/l Digital hair x 10 digits was absent Skin temperature gradient WNL b/l  Dermatological Examination: Skin with normal turgor, texture and tone b/l  Toenails 1-5 b/l discolored, thick, dystrophic with subungual debris and pain with palpation to nailbeds due to thickness of nails.  Preulcerative calluslocated submetatarsal head 3 right foot.No flocculence. No perilesional erythema.No tenderness to palpation. No flocculence. Nolymphangitis noted. No probing to bone, no undermining, no active pus or purulence noted.Area measures 3.0 x 2.5 cm and remains closed.  Musculoskeletal: Muscle strength 5/5 to all muscle groups b/l  Neurological: Sensation with 10 gram monofilament is absent b/l Vibratory sensation absent b/l  Assessment: 1. Painful onychomycosis toenails 1-5 b/l 2. Preulcerative callus 3. NIDDM with neuropathy  Plan: 1. Toenails 1-5 b/l were debrided in length and girth without iatrogenic bleeding. 2. Preulcerative callus pared submet head 3 right foot 3. Patient to continue prescribed diabetic shoe gear/insoles 4. Patient to report any pedal injuries to medical professional  5. Follow up 6 weeks.  Patient/POA to call should there be a concern in the interim.

## 2018-10-14 DIAGNOSIS — E113293 Type 2 diabetes mellitus with mild nonproliferative diabetic retinopathy without macular edema, bilateral: Secondary | ICD-10-CM | POA: Diagnosis not present

## 2018-10-14 DIAGNOSIS — H40001 Preglaucoma, unspecified, right eye: Secondary | ICD-10-CM | POA: Diagnosis not present

## 2018-10-14 DIAGNOSIS — H401122 Primary open-angle glaucoma, left eye, moderate stage: Secondary | ICD-10-CM | POA: Diagnosis not present

## 2018-10-14 LAB — HM DIABETES EYE EXAM

## 2018-10-19 DIAGNOSIS — J45901 Unspecified asthma with (acute) exacerbation: Secondary | ICD-10-CM | POA: Diagnosis not present

## 2018-10-23 ENCOUNTER — Encounter (HOSPITAL_COMMUNITY): Payer: Self-pay

## 2018-10-23 ENCOUNTER — Other Ambulatory Visit: Payer: Self-pay

## 2018-10-23 ENCOUNTER — Emergency Department (HOSPITAL_COMMUNITY): Payer: Medicare Other

## 2018-10-23 ENCOUNTER — Observation Stay (HOSPITAL_COMMUNITY)
Admission: EM | Admit: 2018-10-23 | Discharge: 2018-10-24 | Disposition: A | Discharge status: Home / Self Care | Payer: Medicare Other | Attending: Internal Medicine | Admitting: Internal Medicine

## 2018-10-23 DIAGNOSIS — E785 Hyperlipidemia, unspecified: Secondary | ICD-10-CM | POA: Diagnosis not present

## 2018-10-23 DIAGNOSIS — Z87891 Personal history of nicotine dependence: Secondary | ICD-10-CM | POA: Diagnosis not present

## 2018-10-23 DIAGNOSIS — Z7982 Long term (current) use of aspirin: Secondary | ICD-10-CM | POA: Insufficient documentation

## 2018-10-23 DIAGNOSIS — R079 Chest pain, unspecified: Secondary | ICD-10-CM | POA: Diagnosis not present

## 2018-10-23 DIAGNOSIS — E1139 Type 2 diabetes mellitus with other diabetic ophthalmic complication: Secondary | ICD-10-CM | POA: Diagnosis not present

## 2018-10-23 DIAGNOSIS — Z23 Encounter for immunization: Secondary | ICD-10-CM | POA: Diagnosis not present

## 2018-10-23 DIAGNOSIS — I2510 Atherosclerotic heart disease of native coronary artery without angina pectoris: Secondary | ICD-10-CM | POA: Diagnosis not present

## 2018-10-23 DIAGNOSIS — Z79899 Other long term (current) drug therapy: Secondary | ICD-10-CM | POA: Insufficient documentation

## 2018-10-23 DIAGNOSIS — J441 Chronic obstructive pulmonary disease with (acute) exacerbation: Secondary | ICD-10-CM | POA: Diagnosis not present

## 2018-10-23 DIAGNOSIS — R222 Localized swelling, mass and lump, trunk: Secondary | ICD-10-CM | POA: Insufficient documentation

## 2018-10-23 DIAGNOSIS — Z7984 Long term (current) use of oral hypoglycemic drugs: Secondary | ICD-10-CM | POA: Diagnosis not present

## 2018-10-23 DIAGNOSIS — I10 Essential (primary) hypertension: Secondary | ICD-10-CM | POA: Insufficient documentation

## 2018-10-23 DIAGNOSIS — Z8249 Family history of ischemic heart disease and other diseases of the circulatory system: Secondary | ICD-10-CM | POA: Insufficient documentation

## 2018-10-23 DIAGNOSIS — E1142 Type 2 diabetes mellitus with diabetic polyneuropathy: Secondary | ICD-10-CM | POA: Insufficient documentation

## 2018-10-23 DIAGNOSIS — R0602 Shortness of breath: Secondary | ICD-10-CM | POA: Diagnosis not present

## 2018-10-23 DIAGNOSIS — J45901 Unspecified asthma with (acute) exacerbation: Secondary | ICD-10-CM | POA: Diagnosis present

## 2018-10-23 HISTORY — DX: Type 2 diabetes mellitus without complications: E11.9

## 2018-10-23 HISTORY — DX: Unspecified glaucoma: H40.9

## 2018-10-23 HISTORY — DX: Unspecified chronic bronchitis: J42

## 2018-10-23 LAB — GLUCOSE, CAPILLARY
Glucose-Capillary: 210 mg/dL — ABNORMAL HIGH (ref 70–99)
Glucose-Capillary: 236 mg/dL — ABNORMAL HIGH (ref 70–99)

## 2018-10-23 LAB — INFLUENZA PANEL BY PCR (TYPE A & B)
INFLBPCR: NEGATIVE
Influenza A By PCR: NEGATIVE

## 2018-10-23 LAB — RESPIRATORY PANEL BY PCR
Adenovirus: NOT DETECTED
Bordetella pertussis: NOT DETECTED
Chlamydophila pneumoniae: NOT DETECTED
Coronavirus 229E: NOT DETECTED
Coronavirus HKU1: NOT DETECTED
Coronavirus NL63: NOT DETECTED
Coronavirus OC43: NOT DETECTED
INFLUENZA B-RVPPCR: NOT DETECTED
Influenza A: NOT DETECTED
Metapneumovirus: NOT DETECTED
Mycoplasma pneumoniae: NOT DETECTED
PARAINFLUENZA VIRUS 4-RVPPCR: NOT DETECTED
Parainfluenza Virus 1: NOT DETECTED
Parainfluenza Virus 2: NOT DETECTED
Parainfluenza Virus 3: NOT DETECTED
RESPIRATORY SYNCYTIAL VIRUS-RVPPCR: NOT DETECTED
Rhinovirus / Enterovirus: NOT DETECTED

## 2018-10-23 LAB — CBC
HCT: 38.8 % — ABNORMAL LOW (ref 39.0–52.0)
Hemoglobin: 12.2 g/dL — ABNORMAL LOW (ref 13.0–17.0)
MCH: 26.8 pg (ref 26.0–34.0)
MCHC: 31.4 g/dL (ref 30.0–36.0)
MCV: 85.1 fL (ref 80.0–100.0)
Platelets: 237 10*3/uL (ref 150–400)
RBC: 4.56 MIL/uL (ref 4.22–5.81)
RDW: 13.6 % (ref 11.5–15.5)
WBC: 4.5 10*3/uL (ref 4.0–10.5)
nRBC: 0 % (ref 0.0–0.2)

## 2018-10-23 LAB — BASIC METABOLIC PANEL
ANION GAP: 9 (ref 5–15)
BUN: 12 mg/dL (ref 6–20)
CO2: 23 mmol/L (ref 22–32)
Calcium: 8.9 mg/dL (ref 8.9–10.3)
Chloride: 105 mmol/L (ref 98–111)
Creatinine, Ser: 1.1 mg/dL (ref 0.61–1.24)
GFR calc Af Amer: >60 mL/min (ref >60)
GFR calc non Af Amer: >60 mL/min (ref >60)
Glucose, Bld: 158 mg/dL — ABNORMAL HIGH (ref 70–99)
Potassium: 4.3 mmol/L (ref 3.5–5.1)
Sodium: 137 mmol/L (ref 135–145)

## 2018-10-23 LAB — I-STAT TROPONIN, ED: Troponin i, poc: 0 ng/mL (ref 0.00–0.08)

## 2018-10-23 LAB — TROPONIN I: Troponin I: <0.03 ng/mL (ref <0.03)

## 2018-10-23 MED ORDER — MOMETASONE FURO-FORMOTEROL FUM 200-5 MCG/ACT IN AERO
2.0000 | INHALATION_SPRAY | Freq: Two times a day (BID) | RESPIRATORY_TRACT | Status: DC
  Administered 2018-10-23 – 2018-10-24 (×2): 2 via RESPIRATORY_TRACT
  Filled 2018-10-23: qty 8.8

## 2018-10-23 MED ORDER — LORAZEPAM 2 MG/ML IJ SOLN: 1.0000 mg | Freq: Once | INTRAMUSCULAR | Status: DC

## 2018-10-23 MED ORDER — AZITHROMYCIN 250 MG PO TABS: 250.0000 mg | ORAL_TABLET | Freq: Every day | ORAL | Status: DC

## 2018-10-23 MED ORDER — AZITHROMYCIN 250 MG PO TABS
500.0000 mg | ORAL_TABLET | Freq: Every day | ORAL | Status: AC
  Administered 2018-10-23: 500 mg via ORAL
  Filled 2018-10-23: qty 2

## 2018-10-23 MED ORDER — ENOXAPARIN SODIUM 60 MG/0.6ML ~~LOC~~ SOLN
50.0000 mg | SUBCUTANEOUS | Status: DC
  Administered 2018-10-23: 50 mg via SUBCUTANEOUS
  Filled 2018-10-23: qty 0.6

## 2018-10-23 MED ORDER — PREDNISONE 20 MG PO TABS: 40.0000 mg | ORAL_TABLET | Freq: Every day | ORAL | Status: DC

## 2018-10-23 MED ORDER — ACETAMINOPHEN 650 MG RE SUPP: 650.0000 mg | Freq: Four times a day (QID) | RECTAL | Status: DC | PRN

## 2018-10-23 MED ORDER — MONTELUKAST SODIUM 10 MG PO TABS
10.0000 mg | ORAL_TABLET | Freq: Every day | ORAL | Status: DC
  Administered 2018-10-23: 10 mg via ORAL
  Filled 2018-10-23: qty 1

## 2018-10-23 MED ORDER — SODIUM CHLORIDE 0.9% FLUSH: 3.0000 mL | Freq: Once | INTRAVENOUS | Status: DC

## 2018-10-23 MED ORDER — SODIUM CHLORIDE 0.9 % IV BOLUS
500.0000 mL | Freq: Once | INTRAVENOUS | Status: AC
  Administered 2018-10-23: 500 mL via INTRAVENOUS

## 2018-10-23 MED ORDER — IPRATROPIUM-ALBUTEROL 0.5-2.5 (3) MG/3ML IN SOLN
3.0000 mL | Freq: Once | RESPIRATORY_TRACT | Status: AC
  Administered 2018-10-23: 3 mL via RESPIRATORY_TRACT
  Filled 2018-10-23: qty 3

## 2018-10-23 MED ORDER — ASPIRIN EC 81 MG PO TBEC
81.0000 mg | DELAYED_RELEASE_TABLET | Freq: Every day | ORAL | Status: DC
  Administered 2018-10-24: 81 mg via ORAL
  Filled 2018-10-23: qty 1

## 2018-10-23 MED ORDER — IPRATROPIUM-ALBUTEROL 0.5-2.5 (3) MG/3ML IN SOLN
3.0000 mL | Freq: Four times a day (QID) | RESPIRATORY_TRACT | Status: DC | PRN
  Administered 2018-10-23: 3 mL via RESPIRATORY_TRACT
  Filled 2018-10-23: qty 3

## 2018-10-23 MED ORDER — ACETAMINOPHEN 325 MG PO TABS: 650.0000 mg | ORAL_TABLET | Freq: Four times a day (QID) | ORAL | Status: DC | PRN

## 2018-10-23 MED ORDER — INSULIN ASPART 100 UNIT/ML ~~LOC~~ SOLN
0.0000 [IU] | Freq: Every day | SUBCUTANEOUS | Status: DC
  Administered 2018-10-23: 2 [IU] via SUBCUTANEOUS

## 2018-10-23 MED ORDER — MAGNESIUM SULFATE 2 GM/50ML IV SOLN
2.0000 g | Freq: Once | INTRAVENOUS | Status: AC
  Administered 2018-10-23: 2 g via INTRAVENOUS
  Filled 2018-10-23: qty 50

## 2018-10-23 MED ORDER — ATORVASTATIN CALCIUM 40 MG PO TABS
40.0000 mg | ORAL_TABLET | Freq: Every day | ORAL | Status: DC
  Administered 2018-10-23: 40 mg via ORAL
  Filled 2018-10-23: qty 1

## 2018-10-23 MED ORDER — PREDNISONE 20 MG PO TABS
40.0000 mg | ORAL_TABLET | Freq: Every day | ORAL | Status: DC
  Administered 2018-10-24: 40 mg via ORAL
  Filled 2018-10-23: qty 2

## 2018-10-23 MED ORDER — AZITHROMYCIN 250 MG PO TABS
250.0000 mg | ORAL_TABLET | Freq: Every day | ORAL | Status: DC
  Administered 2018-10-24: 250 mg via ORAL
  Filled 2018-10-23: qty 1

## 2018-10-23 MED ORDER — ALBUTEROL (5 MG/ML) CONTINUOUS INHALATION SOLN
10.0000 mg/h | INHALATION_SOLUTION | Freq: Once | RESPIRATORY_TRACT | Status: AC
  Administered 2018-10-23: 10 mg/h via RESPIRATORY_TRACT
  Filled 2018-10-23: qty 20

## 2018-10-23 MED ORDER — AZITHROMYCIN 250 MG PO TABS: 500.0000 mg | ORAL_TABLET | Freq: Once | ORAL | Status: DC

## 2018-10-23 MED ORDER — METHYLPREDNISOLONE SODIUM SUCC 125 MG IJ SOLR
125.0000 mg | Freq: Once | INTRAMUSCULAR | Status: AC
  Administered 2018-10-23: 125 mg via INTRAVENOUS
  Filled 2018-10-23: qty 2

## 2018-10-23 MED ORDER — PNEUMOCOCCAL VAC POLYVALENT 25 MCG/0.5ML IJ INJ
0.5000 mL | INJECTION | INTRAMUSCULAR | Status: AC
  Administered 2018-10-24: 0.5 mL via INTRAMUSCULAR
  Filled 2018-10-23: qty 0.5

## 2018-10-23 MED ORDER — INSULIN ASPART 100 UNIT/ML ~~LOC~~ SOLN
0.0000 [IU] | Freq: Three times a day (TID) | SUBCUTANEOUS | Status: DC
  Administered 2018-10-23 – 2018-10-24 (×2): 7 [IU] via SUBCUTANEOUS

## 2018-10-23 MED ORDER — PREGABALIN 100 MG PO CAPS
100.0000 mg | ORAL_CAPSULE | Freq: Three times a day (TID) | ORAL | Status: DC
  Administered 2018-10-23 – 2018-10-24 (×3): 100 mg via ORAL
  Filled 2018-10-23: qty 1
  Filled 2018-10-23: qty 2
  Filled 2018-10-23: qty 1

## 2018-10-23 MED ORDER — GUAIFENESIN-DM 100-10 MG/5ML PO SYRP
5.0000 mL | ORAL_SOLUTION | ORAL | Status: DC | PRN
  Administered 2018-10-23: 5 mL via ORAL
  Filled 2018-10-23: qty 5

## 2018-10-23 MED ORDER — SENNOSIDES-DOCUSATE SODIUM 8.6-50 MG PO TABS: 1.0000 | ORAL_TABLET | Freq: Every evening | ORAL | Status: DC | PRN

## 2018-10-23 NOTE — H&P (Signed)
Date: 10/23/2018               Patient Name:  Richard [name] MRN: [id number]  DOB: [birth year] Age / Sex: 58 y.o., male   PCP: [name], DO         Medical Service: Internal Medicine Teaching Service         Attending Physician: Dr. Rebeca Alert Raynaldo Opitz, MD    First Contact: Dr. Annie Paras Pager: [phone]  Second Contact: Dr. Tarri Abernethy Pager: [phone]       After Hours (After 5p/  First Contact Pager: [phone]  weekends / holidays): Second Contact Pager: [phone]   Chief Complaint: cough and dyspnea  History of Present Illness: Richard [name] is a 58 yo male with a medical history of COPD, asthma, DMII, tobacco use disorder, and CAD who presented to the ED with shortness of breath, wheezing, and chest tightness. His symptoms began one week ago with sore throat and he subsequently developed chest tightness, nonproductive cough, and wheezing. He has also had dyspnea that worsens with exertion and with his cough. He endorses a fever of 102 last night. His four-year-old granddaughter has also been sick with an upper respiratory illness. He denies orthopnea, leg swelling, and rhinorrhea. He has a history of both asthma and COPD. He recently quit smoking in April of 2019, but he smoked about 1 pack of cigarettes since the age of 49. He uses Dulera BID and albuterol as needed. He has not needed his albuterol much since he quit smoking, but he did use it this morning with some mild relief.    On arrival to the ED, afebrile and hemodynamically stable. He was saturating well on room air. Labs significant for Hb 12.2, glucose 158, negative troponin, negative influenza. EKG NSR. CXR without edema or opacities. He received 1/2L NS, albuterol and duoneb nebulizers, and IV solumedrol in the ED. His symptoms have improved.   Meds:  Current Meds  Medication Sig  . albuterol (PROAIR HFA) 108 (90 Base) MCG/ACT inhaler Inhale 2 puffs into the lungs every 4 (four) hours as needed for wheezing or shortness of  breath.  Marland Kitchen albuterol (PROVENTIL) (2.5 MG/3ML) 0.083% nebulizer solution Take 3 mLs (2.5 mg total) by nebulization every 6 (six) hours as needed for wheezing or shortness of breath.  Marland Kitchen aspirin EC 81 MG EC tablet Take 1 tablet (81 mg total) by mouth daily.  Marland Kitchen atorvastatin (LIPITOR) 40 MG tablet Take 1 tablet (40 mg total) by mouth daily.  . diclofenac sodium (VOLTAREN) 1 % GEL Apply 2-4 g topically 4 (four) times daily. Apply 2g to right shoulder, 4 grams to left knee. (Patient taking differently: Apply 2-4 g topically as needed. Apply 2g to right shoulder, 4 grams to left knee.)  . Dulaglutide (TRULICITY) 1.5 UD/1.4HF SOPN Inject 1.5 mg into the skin once a week. (Patient taking differently: Inject 1.5 mg into the skin every Friday. )  . empagliflozin (JARDIANCE) 10 MG TABS tablet Take 10 mg by mouth daily.  Marland Kitchen levocetirizine (XYZAL) 5 MG tablet Take 1 tablet (5 mg total) by mouth every evening.  . metFORMIN (GLUCOPHAGE) 1000 MG tablet Take 1 tablet (1,000 mg total) by mouth 2 (two) times daily with a meal.  . mometasone-formoterol (DULERA) 200-5 MCG/ACT AERO Inhale 2 puffs into the lungs 2 (two) times daily.  . montelukast (SINGULAIR) 10 MG tablet TAKE 1 TABLET(10 MG) BY MOUTH AT BEDTIME (Patient taking differently: Take 10 mg by mouth at bedtime. )  .  Multiple Vitamins-Minerals (MULTIVITAMIN ADULT PO) Take 1 tablet by mouth daily.  Marland Kitchen nystatin (MYCOSTATIN/NYSTOP) powder Apply between 4th and 5th digits of both feet once daily. (Patient taking differently: Apply 1 Bottle topically at bedtime. Apply between 4th and 5th digits of both feet once daily.)  . pioglitazone (ACTOS) 15 MG tablet Take 1 tablet (15 mg total) by mouth daily.  . pregabalin (LYRICA) 100 MG capsule Take 1 capsule (100 mg total) by mouth 3 (three) times daily.  . timolol (TIMOPTIC) 0.5 % ophthalmic solution Place 1 drop into both eyes 2 (two) times daily.  Marland Kitchen triamcinolone ointment (KENALOG) 0.1 % Apply 1 application topically 2 (two)  times daily as needed.     Allergies: Allergies as of 10/23/2018  . (No Known Allergies)   Past Medical History:  Diagnosis Date  . Asthma   . Clostridium difficile colitis 09/09/2014  . COPD (chronic obstructive pulmonary disease) ([place])   . Diabetes mellitus without complication ([place])    diagnosed in 2013  . Eczema   . Essential hypertension   . Glaucoma    bilateral, surgery on left eye but not right  . Hyperlipidemia   . Neuromuscular disorder ([facility])    neuropathy in feet  . 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 APPENDECTOMY    . CARDIAC CATHETERIZATION N/A 09/26/2015   Procedure: Left Heart Cath and Coronary Angiography;  Surgeon: [name], MD;  Location: [facility];  Service: Cardiovascular;  Laterality: N/A;  . left eye surgery Left    glaucoma  . SKIN GRAFT      Family History: Sister has COPD. Mother died of CHF.  Social History: Lives alone. On disability. Currently going to school to get his GED. Former smoker, 45 pack year history, quit 01/2018. Denies etoh and illicit drug use.  Review of Systems: A complete ROS was negative except as per HPI.   Physical Exam: Blood pressure (!) 142/87, pulse 73, temperature 98.5 F (36.9 C), temperature source Oral, resp. rate 18, height 6\' 3"  (1.905 m), weight 108.9 kg, SpO2 97 %.  Constitutional: Well-developed, well-nourished, and in no distress.  Eyes: Pupils are equal, round, and reactive to light. EOM are normal.  Cardiovascular: Normal rate and regular rhythm. Systolic murmur. Pulmonary/Chest: On room air. Effort normal. Decreased breath sounds. Faint inspiratory wheezes bilaterally.  Abdominal: Bowel sounds present. Soft, non-distended, non-tender. Ext: No lower extremity edema. Skin: Warm and dry. No rashes or wounds.  EKG: personally reviewed my interpretation is NSR without ischemic changes.  CXR:  personally reviewed my interpretation is no edema or opacities.  Assessment & Plan by Problem: Active Problems:   Acute exacerbation of COPD with asthma [facility])  Mr. Costlow is a 58 yo male with a medical history of COPD, asthma, DMII, and CAD (mild nonobstructive CAD on cardiac cath in 2016) who presented with cough, dyspnea, chest tightness, and wheezing as well as fever to 102. He was admitted for evaluation and management of exacerbation of his obstructive airway disease.  Acute COPD exacerbation with asthma  - Current home therapy includes aluberol inhaler PRN, Dulera inhaler, montelukast. He is usually well-controlled on this.  - Currently afebrile without leukocytosis. Satting well on room air. No hypoxia. - Likely precipitated by a viral URI. Influenza negative. - Has faint wheezing on exam.  - No signs of pneumonia on CXR. - History  of CAD and presented with chest pain/tightness. Initial troponin negative. EKG without ischemic changes. Chest pain and tightness are more likely related to obstructive airway disease and muscular strain 2/2 cough than ACS. Plan - Azithromycin - Prednisone 40mg  daily - Magnesium 2g IV - Duonebs q6hrs PRN - Continue home Dulera 2 puffs BID - Continue home Singulair 10mg  daily - am BMP and CBC - Repeat troponin  - Respiratory panel - Tele  DMII - Last A1c was 9.2 two months ago. Current regimen includes metformin 1,000mg  BID, Trulicity 1.5mg  weekly, Jardiance 10mg  daily, Pioglitazone 15mg  daily. 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 Biopsy not recommended due to small size. Repeat chest CT recommended in 6 months (11/2018). No intervention required at this time.  FEN: no IV fluids, regular diet, replace electrolytes as needed  DVT ppx: [place] Lovenox Code status: FULL code  Dispo: Admit  patient to Observation with expected length of stay less than 2 midnights.  Signed: Corinne Ports, MD 10/23/2018, 2:39 PM  Pager: [phone]

## 2018-10-23 NOTE — ED Notes (Signed)
Pt c/o increased CP; EDP aware

## 2018-10-23 NOTE — ED Triage Notes (Signed)
Pt endorses flu sx with cough x 1.5 weeks, bodyaches and fever. Pt also having left sided chest pain with left arm tingling. Taking OTC meds without relief.

## 2018-10-23 NOTE — ED Provider Notes (Signed)
[name] EMERGENCY DEPARTMENT Provider Note   CSN: [id number] Arrival date & time: 10/23/18  6440   History   Chief Complaint Chief Complaint  Patient presents with  . Influenza  . Chest Pain    HPI Richard [name] is a 58 y.o. male with history of asthma, COPD, diabetes, hypertension, hyperlipidemia presenting today with 1.5 week of flulike symptoms.  Patient states over the past 1.5 weeks he has been dealing with a cough, minimally productive with yellow sputum; additionally with rhinorrhea, congestion.  Patient states that a day after his cough began he developed central chest pain and shortness of breath.  Patient states that his chest pain and shortness of breath are exacerbated by his coughing.  Patient states that his chest feels tight and feels that it is difficult for him to catch his breath.  Additionally patient endorses generalized body aches as well as fever.  Patient states that he measured his temperature at 103 F last night on his home thermometer.  Patient states that he has been using DayQuil and NyQuil for his symptoms with minimal relief.  Patient denies using albuterol for his symptoms as he does not have this at home.  Patient states that he feels that he is in need of a breathing treatment at this time.  Patient denies history of blood clots, recent mobilization/surgery, recent immobilization, extremity swelling/pain, hemoptysis or history of cancer.  HPI  Past Medical History:  Diagnosis Date  . Asthma   . Clostridium difficile colitis 09/09/2014  . COPD (chronic obstructive pulmonary disease) ([place])   . Diabetes mellitus without complication ([place])    diagnosed in 2013  . Eczema   . Essential hypertension   . Glaucoma    bilateral, surgery on left eye but not right  . Hyperlipidemia   . Neuromuscular disorder (HCC)    neuropathy in feet  . 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 Substance abuse ([place])    crack - recovered x 30 yrs    Patient  Active Problem List   Diagnosis Date Noted  . COPD exacerbation ([place]) 10/23/2018  . Constipation 09/26/2018  . Need for Tdap vaccination 09/19/2018  . Bilateral impacted cerumen 08/29/2018  . Conductive hearing loss, bilateral 08/29/2018  . Mediastinal mass 08/25/2018  . Cerumen impaction 08/21/2018  . Asthma exacerbation 01/13/2018  . Bursitis of right shoulder 01/09/2018  . Osteoarthritis of left knee 01/09/2018  . Peripheral arterial disease ([place]) 10/21/2017  . 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 Erectile dysfunction 03/06/2017  . Abdominal aortic atherosclerosis ([place]) 01/18/2017  . Vitamin D deficiency 07/02/2016  . Tubular adenoma of colon 02/06/2016  . Diabetic neuropathy, painful ([place]) 11/30/2015  . Overweight (BMI 25.0-29.9) 11/30/2015  . CAD in native artery 10/20/2015  . Tobacco abuse   . Essential hypertension   . 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 Asthma     Past Surgical History:  Procedure Laterality Date  . APPENDECTOMY    . CARDIAC CATHETERIZATION N/A 09/26/2015   Procedure: Left Heart Cath and Coronary Angiography;  Surgeon: [name], MD;  Location: [facility];  Service: Cardiovascular;  Laterality: N/A;  . left eye surgery Left    glaucoma  . SKIN GRAFT          Home Medications    Prior to Admission medications   Medication Sig Start Date End Date Taking? Authorizing  Provider  albuterol (PROAIR HFA) 108 (90 Base) MCG/ACT inhaler Inhale 2 puffs into the lungs every 4 (four) hours as needed for wheezing or shortness of breath.   Yes [provider]  albuterol (PROVENTIL) (2.5 MG/3ML) 0.083% nebulizer solution Take 3 mLs (2.5 mg total) by nebulization every 6 (six) hours as needed for wheezing or shortness of breath. 01/17/18  Yes Melvin, Alexander, MD  aspirin EC 81 MG EC tablet Take 1 tablet (81 mg total)  by mouth daily. 09/26/15  Yes Burns, Alexa R, MD  atorvastatin (LIPITOR) 40 MG tablet Take 1 tablet (40 mg total) by mouth daily. 10/10/17 10/23/18 Yes Hoffman, Erik C, DO  diclofenac sodium (VOLTAREN) 1 % GEL Apply 2-4 g topically 4 (four) times daily. Apply 2g to right shoulder, 4 grams to left knee. Patient taking differently: Apply 2-4 g topically as needed. Apply 2g to right shoulder, 4 grams to left knee. 01/09/18  Yes Hoffman, Erik C, DO  Dulaglutide (TRULICITY) 1.5 MG/0.5ML SOPN Inject 1.5 mg into the skin once a week. Patient taking differently: Inject 1.5 mg into the skin every Friday.  01/09/18  Yes Hoffman, Erik C, DO  empagliflozin (JARDIANCE) 10 MG TABS tablet Take 10 mg by mouth daily. 10/03/18  Yes Hoffman, Erik C, DO  levocetirizine (XYZAL) 5 MG tablet Take 1 tablet (5 mg total) by mouth every evening. 05/30/18  Yes Padgett, Shaylar Patricia, MD  metFORMIN (GLUCOPHAGE) 1000 MG tablet Take 1 tablet (1,000 mg total) by mouth 2 (two) times daily with a meal. 10/10/17  Yes Hoffman, Erik C, DO  mometasone-formoterol (DULERA) 200-5 MCG/ACT AERO Inhale 2 puffs into the lungs 2 (two) times daily. 01/17/18  Yes Melvin, Alexander, MD  montelukast (SINGULAIR) 10 MG tablet TAKE 1 TABLET(10 MG) BY MOUTH AT BEDTIME Patient taking differently: Take 10 mg by mouth at bedtime.  09/15/18  Yes Padgett, Shaylar Patricia, MD  Multiple Vitamins-Minerals (MULTIVITAMIN ADULT PO) Take 1 tablet by mouth daily. 05/20/17  Yes [provider]  nystatin (MYCOSTATIN/NYSTOP) powder Apply between 4th and 5th digits of both feet once daily. Patient taking differently: Apply 1 Bottle topically at bedtime. Apply between 4th and 5th digits of both feet once daily. 07/27/18  Yes [place], Jennifer L, DPM  pioglitazone (ACTOS) 15 MG tablet Take 1 tablet (15 mg total) by mouth daily. 08/21/18  Yes Hoffman, Erik C, DO  pregabalin (LYRICA) 100 MG capsule Take 1 capsule (100 mg total) by mouth 3 (three) times daily. 05/20/18  Yes  Butcher, Elizabeth A, MD  timolol (TIMOPTIC) 0.5 % ophthalmic solution Place 1 drop into both eyes 2 (two) times daily. 01/10/18  Yes [provider]  triamcinolone ointment (KENALOG) 0.1 % Apply 1 application topically 2 (two) times daily as needed. 02/21/18  Yes Padgett, Shaylar Patricia, MD  ACCU-CHEK FASTCLIX LANCETS MISC Check blood sugar two times a day 06/02/18   Raines, Alexander N, MD  Blood Glucose Monitoring Suppl (ACCU-CHEK GUIDE) w/Device KIT 1 each by Does not apply route 2 (two) times daily. 11/21/16   Hoffman, Erik C, DO  cetirizine (ZYRTEC) 10 MG tablet Take 1 tablet (10 mg total) by mouth daily. Patient not taking: Reported on 10/23/2018 02/21/18   Padgett, Shaylar Patricia, MD  glucose blood (ACCU-CHEK GUIDE) test strip Check blood sugar two times a day 06/02/18   Raines, Alexander N, MD  glucose blood (ACCU-CHEK GUIDE) test strip USE TO CHECK BLOOD SUGAR BID 06/02/18   [provider]  Lancets Misc. (UNISTIK 2 NORMAL) MISC Check   blood sugar two times a day 06/02/18   [provider]  polyethylene glycol (MIRALAX) packet Take 17 g by mouth daily. Patient not taking: Reported on 10/23/2018 09/26/18   [name], MD    Family History Family History  Problem Relation Age of Onset  . Hypertension Mother   . Hypertension Sister   . Glaucoma Sister   . Colon cancer Neg Hx   . Colon polyps Neg Hx   . Esophageal cancer Neg Hx   . Rectal cancer Neg Hx   . Stomach cancer Neg Hx     Social History Social History   Tobacco Use  . Smoking status: Former Smoker    Packs/day: 1.00    Years: 45.00    Pack years: 45.00    Types: Cigarettes    Last attempt to quit: 01/12/2018    Years since quitting: 0.7  . Smokeless tobacco: Former Systems developer    Quit date: 08/24/2016  Substance Use Topics  . Alcohol use: No    Alcohol/week: 0.0 standard drinks  . Drug use: No     Allergies   Patient has no known allergies.   Review of Systems Review of Systems   Constitutional: Positive for fever. Negative for chills.  HENT: Positive for congestion and rhinorrhea. Negative for sore throat and trouble swallowing.   Eyes: Negative.  Negative for visual disturbance.  Respiratory: Positive for cough, chest tightness, shortness of breath and wheezing.   Cardiovascular: Positive for chest pain (Tightness). Negative for palpitations and leg swelling.  Gastrointestinal: Negative.  Negative for abdominal pain, blood in stool, diarrhea, nausea and vomiting.  Neurological: Negative.  Negative for dizziness, syncope, weakness, numbness and headaches.  All other systems reviewed and are negative.  Physical Exam Updated Vital Signs BP (!) 142/87   Pulse 73   Temp 98.5 F (36.9 C) (Oral)   Resp 18   Ht 6' 3" (1.905 m)   Wt 108.9 kg   SpO2 97%   BMI 30.00 kg/m   Physical Exam Constitutional:      General: He is not in acute distress.    Appearance: He is well-developed. He is not ill-appearing or diaphoretic.  HENT:     Head: Normocephalic and atraumatic.     Right Ear: Hearing, tympanic membrane, ear canal and external ear normal.     Left Ear: Hearing, tympanic membrane, ear canal and external ear normal.     Nose: Rhinorrhea present. Rhinorrhea is clear.     Mouth/Throat:     Lips: Pink.     Mouth: Mucous membranes are moist.     Pharynx: Oropharynx is clear. Uvula midline.  Eyes:     General: Vision grossly intact. Gaze aligned appropriately.     Extraocular Movements: Extraocular movements intact.     Conjunctiva/sclera: Conjunctivae normal.     Pupils: Pupils are equal, round, and reactive to light.  Neck:     Musculoskeletal: Full passive range of motion without pain, normal range of motion and neck supple.     Trachea: Trachea and phonation normal. No tracheal deviation.  Cardiovascular:     Rate and Rhythm: Normal rate and regular rhythm.     Pulses: Normal pulses.          Radial pulses are 2+ on the right side and 2+ on the left  side.       Dorsalis pedis pulses are 2+ on the right side and 2+ on the left side.  Posterior tibial pulses are 2+ on the right side and 2+ on the left side.     Heart sounds: Normal heart sounds.  Pulmonary:     Effort: Pulmonary effort is normal. No respiratory distress.     Breath sounds: Wheezing present. No decreased breath sounds or rhonchi.     Comments: Diffuse wheezing bilaterally. Chest:     Chest wall: Tenderness present. No deformity or crepitus.  Abdominal:     General: Bowel sounds are normal.     Palpations: Abdomen is soft.     Tenderness: There is no abdominal tenderness. There is no guarding or rebound.  Musculoskeletal: Normal range of motion.     Right lower leg: Normal. He exhibits no tenderness. No edema.     Left lower leg: Normal. He exhibits no tenderness. No edema.  Feet:     Right foot:     Protective Sensation: 3 sites tested. 3 sites sensed.     Left foot:     Protective Sensation: 3 sites tested. 3 sites sensed.  Skin:    General: Skin is warm and dry.     Capillary Refill: Capillary refill takes less than 2 seconds.  Neurological:     Mental Status: He is alert and oriented to person, place, and time.     GCS: GCS eye subscore is 4. GCS verbal subscore is 5. GCS motor subscore is 6.     Comments: Speech is clear and goal oriented, follows commands Major Cranial nerves without deficit, no facial droop Normal strength in upper and lower extremities bilaterally including dorsiflexion and plantar flexion, strong and equal grip strength Sensation normal to light touch Moves extremities without ataxia, coordination intact Normal gait  Psychiatric:        Behavior: Behavior normal.    ED Treatments / Results  Labs (all labs ordered are listed, but only abnormal results are displayed) Labs Reviewed  BASIC METABOLIC PANEL - Abnormal; Notable for the following components:      Result Value   Glucose, Bld 158 (*)    All other components within  normal limits  CBC - Abnormal; Notable for the following components:   Hemoglobin 12.2 (*)    HCT 38.8 (*)    All other components within normal limits  RESPIRATORY PANEL BY PCR  INFLUENZA PANEL BY PCR (TYPE A & B)  TROPONIN I  TROPONIN I  I-STAT TROPONIN, ED    EKG EKG Interpretation  Date/Time:  Thursday October 23 2018 12:05:16 EST Ventricular Rate:  76 PR Interval:    QRS Duration: 90 QT Interval:  385 QTC Calculation: 433 R Axis:   15 Text Interpretation:  Sinus rhythm Normal ECG Confirmed by DeLo, Douglas ([id number]) on 10/23/2018 12:26:10 PM   Radiology Dg Chest 2 View  Result Date: 10/23/2018 CLINICAL DATA:  Shortness of breath and chest pain EXAM: CHEST - 2 VIEW COMPARISON:  01/16/2018 FINDINGS: Artifact from EKG leads. Normal heart size and mediastinal contours. No acute infiltrate or edema. No effusion or pneumothorax. No acute osseous findings. IMPRESSION: Negative chest. Electronically Signed   By: Jonathon  Watts M.D.   On: 10/23/2018 10:48    Procedures Procedures (including critical care time)  Medications Ordered in ED Medications  sodium chloride flush (NS) 0.9 % injection 3 mL (3 mLs Intravenous Not Given 10/23/18 1011)  enoxaparin (LOVENOX) injection 40 mg (has no administration in time range)  acetaminophen (TYLENOL) tablet 650 mg (has no administration in time range)    Or    acetaminophen (TYLENOL) suppository 650 mg (has no administration in time range)  senna-docusate (Senokot-S) tablet 1 tablet (has no administration in time range)  aspirin EC tablet 81 mg (has no administration in time range)  atorvastatin (LIPITOR) tablet 40 mg (has no administration in time range)  pregabalin (LYRICA) capsule 100 mg (has no administration in time range)  mometasone-formoterol (DULERA) 200-5 MCG/ACT inhaler 2 puff (has no administration in time range)  montelukast (SINGULAIR) tablet 10 mg (has no administration in time range)  ipratropium-albuterol (DUONEB) 0.5-2.5  (3) MG/3ML nebulizer solution 3 mL (has no administration in time range)  azithromycin (ZITHROMAX) tablet 500 mg (has no administration in time range)    Followed by  azithromycin (ZITHROMAX) tablet 250 mg (has no administration in time range)  predniSONE (DELTASONE) tablet 40 mg (has no administration in time range)  sodium chloride 0.9 % bolus 500 mL (0 mLs Intravenous Stopped 10/23/18 1117)  ipratropium-albuterol (DUONEB) 0.5-2.5 (3) MG/3ML nebulizer solution 3 mL (3 mLs Nebulization Given 10/23/18 1011)  albuterol (PROVENTIL,VENTOLIN) solution continuous neb (10 mg/hr Nebulization Given 10/23/18 1129)  methylPREDNISolone sodium succinate (SOLU-MEDROL) 125 mg/2 mL injection 125 mg (125 mg Intravenous Given 10/23/18 1245)     Initial Impression / Assessment and Plan / ED Course  I have reviewed the triage vital signs and the nursing notes.  Pertinent labs & imaging results that were available during my care of the patient were reviewed by me and considered in my medical decision making (see chart for details).    Patient arrives following 1.5 weeks of URI-like symptoms with cough, productive with yellow sputum.  Chest pain for greater than 1 week only with cough, described as a tightness.  Patient with bilateral diffuse wheezing on examination.  Pedal pulses equal and intact bilaterally.  Patient is overall well-appearing.  Heart rate 82 bpm, SPO2 99% on room air.  Suspect COPD/asthma exacerbation at this time.  Patient is low risk by Wells criteria for PE. ------------------ Initial troponin negative BMP nonacute CBC nonacute Chest x-ray negative EKG without acute changes reviewed by Dr. Delo  Patient states breathing is slightly improved following first nebulizer treatment and is requesting a second.  Hour-long nebulizer ordered. ------------------ Informed by RN that patient is still having chest pain, repeat EKG reviewed by Dr. Dillow without acute changes.  Is not yet time for delta  troponin.  I have reexamined the patient and he states that the pain he is referring to is a tightness in his chest that he associates with asthma exacerbations, he states that his tightness is only present with his coughing spells.  Patient still with mild diffuse wheezing bilaterally.  We discussed with Dr. Delo, will admit patient for COPD exacerbation at this time in addition to cardiac rule out.  Solu-Medrol ordered. ------------ Second troponin is negative ------------------ Discussed case with admitting team.  Patient has been admitted for further evaluation and treatment.  Patient's case discussed with Dr. Delo during this visit who agrees with work-up and admission.  Note: Portions of this report may have been transcribed using voice recognition software. Every effort was made to ensure accuracy; however, inadvertent computerized transcription errors may still be present. Final Clinical Impressions(s) / ED Diagnoses   Final diagnoses:  COPD with acute exacerbation (HCC)    ED Discharge Orders    None       Morelli, Brandon A, PA-C 10/23/18 1430    Delo, Douglas, MD 10/23/18 1512  

## 2018-10-24 ENCOUNTER — Telehealth: Payer: Self-pay | Admitting: Internal Medicine

## 2018-10-24 DIAGNOSIS — I2510 Atherosclerotic heart disease of native coronary artery without angina pectoris: Secondary | ICD-10-CM

## 2018-10-24 DIAGNOSIS — Z79899 Other long term (current) drug therapy: Secondary | ICD-10-CM

## 2018-10-24 DIAGNOSIS — J069 Acute upper respiratory infection, unspecified: Secondary | ICD-10-CM | POA: Diagnosis not present

## 2018-10-24 DIAGNOSIS — J9859 Other diseases of mediastinum, not elsewhere classified: Secondary | ICD-10-CM | POA: Diagnosis not present

## 2018-10-24 DIAGNOSIS — J441 Chronic obstructive pulmonary disease with (acute) exacerbation: Secondary | ICD-10-CM | POA: Diagnosis not present

## 2018-10-24 DIAGNOSIS — E119 Type 2 diabetes mellitus without complications: Secondary | ICD-10-CM

## 2018-10-24 DIAGNOSIS — Z7951 Long term (current) use of inhaled steroids: Secondary | ICD-10-CM

## 2018-10-24 DIAGNOSIS — Z7982 Long term (current) use of aspirin: Secondary | ICD-10-CM

## 2018-10-24 DIAGNOSIS — Z87891 Personal history of nicotine dependence: Secondary | ICD-10-CM

## 2018-10-24 DIAGNOSIS — Z7984 Long term (current) use of oral hypoglycemic drugs: Secondary | ICD-10-CM

## 2018-10-24 LAB — BASIC METABOLIC PANEL
Anion gap: 10 (ref 5–15)
BUN: 17 mg/dL (ref 6–20)
CO2: 25 mmol/L (ref 22–32)
Calcium: 8.8 mg/dL — ABNORMAL LOW (ref 8.9–10.3)
Chloride: 101 mmol/L (ref 98–111)
Creatinine, Ser: 1.13 mg/dL (ref 0.61–1.24)
GFR calc Af Amer: >60 mL/min (ref >60)
GFR calc non Af Amer: >60 mL/min (ref >60)
GLUCOSE: 188 mg/dL — AB (ref 70–99)
Potassium: 4.6 mmol/L (ref 3.5–5.1)
Sodium: 136 mmol/L (ref 135–145)

## 2018-10-24 LAB — CBC
HCT: 37.9 % — ABNORMAL LOW (ref 39.0–52.0)
Hemoglobin: 12 g/dL — ABNORMAL LOW (ref 13.0–17.0)
MCH: 26.3 pg (ref 26.0–34.0)
MCHC: 31.7 g/dL (ref 30.0–36.0)
MCV: 83.1 fL (ref 80.0–100.0)
Platelets: 242 10*3/uL (ref 150–400)
RBC: 4.56 MIL/uL (ref 4.22–5.81)
RDW: 13.6 % (ref 11.5–15.5)
WBC: 7.4 10*3/uL (ref 4.0–10.5)
nRBC: 0 % (ref 0.0–0.2)

## 2018-10-24 LAB — GLUCOSE, CAPILLARY
Glucose-Capillary: 123 mg/dL — ABNORMAL HIGH (ref 70–99)
Glucose-Capillary: 244 mg/dL — ABNORMAL HIGH (ref 70–99)

## 2018-10-24 LAB — C-REACTIVE PROTEIN: CRP: <0.8 mg/dL (ref <1.0)

## 2018-10-24 MED ORDER — AEROCHAMBER PLUS FLO-VU MEDIUM MISC
1.0000 | Freq: Once | Status: DC
  Filled 2018-10-24: qty 1

## 2018-10-24 MED ORDER — PREDNISONE 20 MG PO TABS: 40.0000 mg | ORAL_TABLET | Freq: Every day | ORAL | 0 refills | Status: DC

## 2018-10-24 MED ORDER — AEROCHAMBER PLUS FLO-VU LARGE MISC: 1.0000 | Freq: Once | 0 refills | Status: AC

## 2018-10-24 NOTE — Care Management Obs Status (Signed)
[facility] NOTIFICATION   Patient Details  Name: Richard [name] MRN: [id number] Date of Birth: 1961-05-14   Medicare Observation Status Notification Given:  Yes    Zenon Mayo, RN 10/24/2018, 11:35 AM

## 2018-10-24 NOTE — Progress Notes (Signed)
   Subjective: Patient seen sitting up at the bedside this morning. States he feels better. He reports his chest tightness has improved. The phlegm in his chest has loosened up but he is having difficulty getting it up. He reports chest pain when he coughs. His breathing is okay at rest, but he feels it worsens on ambulation. He says he used to feel this way when he smoked but he no longer smokes. He takes a spray for his cough and OTC cough medicine when he is at home. He states the cough medicines cause his blood sugar to rise. He was informed the pill form of the cough medicines usually don't have sugar in them. He was informed that his symptoms are from a virus and his asthma and that he will need to continue his breathing treatments. All questions and concerns addressed.  Objective:  Vital signs in last 24 hours: Vitals:   10/23/18 1708 10/23/18 1922 10/23/18 2118 10/23/18 2344  BP: 131/71 134/80  (!) 106/57  Pulse: 90 86 85 89  Resp:   17 15  Temp: 98 F (36.7 C) 97.8 F (36.6 C)  98.7 F (37.1 C)  TempSrc:    Oral  SpO2: 100% 99% 98% 98%  Weight:      Height:       Gen: Comfortably sitting at the bedside, no distress Lungs: CTAB, no wheezes  Assessment/Plan:  Active Problems:   Acute exacerbation of COPD with asthma [facility])  Richard [name] is a 58 yo male with a medical history of COPD, asthma, DMII, and CAD (mild nonobstructive CAD on cardiac cath in 2016) who presented with cough, dyspnea, chest tightness, and wheezing as well as fever to 102. He was admitted for evaluation and management of exacerbation of his obstructive airway disease.  Acute asthma exacerbation with COPD - Clinically improved with no wheezing on exam. Satting well on room air. - Afebrile. No leukocytosis. Negative respiratory pathogen panel. - CRP negative. Appears to be more of an asthma exacerbation than COPD exacerbation. No need for further antibiotics.   - Troponins negative x2. No suspicion of  ACS. Plan - Prednisone (day 2/5) - Duonebs q6hrs PRN - Continue home Dulera 2 puffs BID - Continue home Singulair 10mg  daily - Discharge home today   DMII - Elevated blood sugars during admission 2/2 steroids. Am glucose 188. Plan - Resistant SSI  Dispo: Anticipated discharge today.  Richard [name], Andree Elk, MD 10/24/2018, 6:11 AM Pager: [phone]

## 2018-10-24 NOTE — Care Management Note (Signed)
Case Management Note  Patient Details  Name: Richard [name] MRN: [id number] Date of Birth: 03/26/1961  Subjective/Objective:   From home, copd ex. For dc today, no needs.                 Action/Plan: DC home.  Expected Discharge Date:  10/24/18               Expected Discharge Plan:  Home/Self Care  In-House Referral:     Discharge planning Services  CM Consult  Post Acute Care Choice:    Choice offered to:     DME Arranged:    DME Agency:     HH Arranged:    HH Agency:     Status of Service:  Completed, signed off  If discussed at [employer] of Stay Meetings, dates discussed:    Additional Comments:  Zenon Mayo, RN 10/24/2018, 12:00 PM

## 2018-10-24 NOTE — Progress Notes (Signed)
Patient given d/c instructions and verbalizes understanding. Patient with no complaints at the current time. D/c to car via [place].

## 2018-10-24 NOTE — Telephone Encounter (Signed)
Hosp f/u per Dr Annie Paras; pt appt  01/22 1045am/NW

## 2018-10-24 NOTE — Discharge Summary (Signed)
Name: Richard [name] MRN: [id number] DOB: [birth year] 58 y.o. PCP: [name], DO  Date of Admission: 10/23/2018  9:33 AM Date of Discharge: 10/24/2018 Attending Physician: Dr. Rebeca Alert  Discharge Diagnosis: 1. Acute COPD exacerbation with asthma 2. DMII 3. CAD 4. Mediastinal mass  Discharge Medications: Allergies as of 10/24/2018   No Known Allergies     Medication List    TAKE these medications   ACCU-CHEK FASTCLIX LANCETS Misc Check blood sugar two times a day   ACCU-CHEK GUIDE w/Device Kit 1 each by Does not apply route 2 (two) times daily.   AEROCHAMBER PLUS FLO-VU LARGE Misc 1 each by Other route once for 1 dose.   aspirin 81 MG EC tablet Take 1 tablet (81 mg total) by mouth daily.   atorvastatin 40 MG tablet Commonly known as:  LIPITOR Take 1 tablet (40 mg total) by mouth daily.   cetirizine 10 MG tablet Commonly known as:  ZYRTEC Take 1 tablet (10 mg total) by mouth daily.   diclofenac sodium 1 % Gel Commonly known as:  VOLTAREN Apply 2-4 g topically 4 (four) times daily. Apply 2g to right shoulder, 4 grams to left knee. What changed:    when to take this  reasons to take this   Dulaglutide 1.5 MG/0.5ML Sopn Commonly known as:  TRULICITY Inject 1.5 mg into the skin once a week. What changed:  when to take this   empagliflozin 10 MG Tabs tablet Commonly known as:  JARDIANCE Take 10 mg by mouth daily.   glucose blood test strip Commonly known as:  ACCU-CHEK GUIDE Check blood sugar two times a day   ACCU-CHEK GUIDE test strip Generic drug:  glucose blood USE TO CHECK BLOOD SUGAR BID   levocetirizine 5 MG tablet Commonly known as:  XYZAL Take 1 tablet (5 mg total) by mouth every evening.   metFORMIN 1000 MG tablet Commonly known as:  GLUCOPHAGE Take 1 tablet (1,000 mg total) by mouth 2 (two) times daily with a meal.   mometasone-formoterol 200-5 MCG/ACT Aero Commonly known as:  DULERA Inhale 2 puffs into the lungs 2 (two) times  daily.   montelukast 10 MG tablet Commonly known as:  SINGULAIR TAKE 1 TABLET(10 MG) BY MOUTH AT BEDTIME What changed:    how much to take  how to take this  when to take this  additional instructions   MULTIVITAMIN ADULT PO Take 1 tablet by mouth daily.   nystatin powder Commonly known as:  MYCOSTATIN/NYSTOP Apply between 4th and 5th digits of both feet once daily. What changed:    how much to take  how to take this  when to take this   pioglitazone 15 MG tablet Commonly known as:  ACTOS Take 1 tablet (15 mg total) by mouth daily.   polyethylene glycol packet Commonly known as:  MIRALAX Take 17 g by mouth daily.   predniSONE 20 MG tablet Commonly known as:  DELTASONE Take 2 tablets (40 mg total) by mouth daily with breakfast. Start taking on:  October 25, 2018   pregabalin 100 MG capsule Commonly known as:  LYRICA Take 1 capsule (100 mg total) by mouth 3 (three) times daily.   PROAIR HFA 108 (90 Base) MCG/ACT inhaler Generic drug:  albuterol Inhale 2 puffs into the lungs every 4 (four) hours as needed for wheezing or shortness of breath.   albuterol (2.5 MG/3ML) 0.083% nebulizer solution Commonly known as:  PROVENTIL Take 3 mLs (2.5 mg total) by nebulization every 6 (  six) hours as needed for wheezing or shortness of breath.   timolol 0.5 % ophthalmic solution Commonly known as:  TIMOPTIC Place 1 drop into both eyes 2 (two) times daily.   triamcinolone ointment 0.1 % Commonly known as:  KENALOG Apply 1 application topically 2 (two) times daily as needed.   UNISTIK 2 NORMAL Misc Check blood sugar two times a day       Disposition and follow-up:   RichardJakeem [name] was discharged from [facility] in Good condition.  At the hospital follow up visit please address:  1. Acute COPD exacerbation with asthma - Treated with nebulizers, steroids, and azithromycin.  - Did not require supplemental oxygen during hospitalization -  Continued home Dulera and albuterol  2. Mediastinal mass - Observed incidentally on CT chest 05/2018 - Please obtain repeat chest CT in 11/2018 for f/u   3.  Labs / imaging needed at time of follow-up: chest CT  4.  Pending labs/ test needing follow-up: none  Follow-up Appointments: [facility] 1/22  Hospital Course by problem list: 1. Acute COPD exacerbation with asthma: [name] is a 58 yo male with a medical history of COPD, asthma, DMII, and CAD (mild nonobstructive CAD on cardiac cath in 2016) who presented with cough, dyspnea, chest tightness, and wheezing as well as fever to 102. He was admitted for evaluation and management of exacerbation of his obstructive airway disease. He had faint wheezing at the time of admission, which resolved during the hospitalization. He was treated with magnesium, nebulizers, and 5-day courses of steroids. He received two days of azithromycin, but this was discontinued as his CRP was negative. He is to f/u with [facility] on 1/22.  2. DMII: Hyperglycemia during hospitalization 2/2 steroids. Treated with resistant SSI. Resumed patients oral diabetic medications on discharge, including metformin, trulicity, jardiance, and pioglitazone.   3. CAD: Negative troponins x2. No ischemic changes on EKG. Continued home aspirin and lipitor.  4. Mediastinal mass: Observed incidentally on CT chest 05/2018. Evaluated by CT surgery during at that time. Biopsy not recommended due to small size. Repeat chest CT recommended in 6 months (11/2018).   Discharge Vitals:   BP 137/81 (BP Location: Left Arm)   Pulse 82   Temp 98.4 F (36.9 C) (Oral)   Resp 16   Ht '6\' 3"'$  (1.905 m)   Wt 105.8 kg   SpO2 98%   BMI 29.15 kg/m   Pertinent Labs, Studies, and Procedures:  CBC Latest Ref Rng & Units 10/24/2018 10/23/2018 02/21/2018  WBC 4.0 - 10.5 K/uL 7.4 4.5 5.2  Hemoglobin 13.0 - 17.0 g/dL 12.0(L) 12.2(L) 13.1  Hematocrit 39.0 - 52.0 % 37.9(L) 38.8(L) 39.4  Platelets 150 - 400 K/uL 242 237  278   CMP Latest Ref Rng & Units 10/24/2018 10/23/2018 01/16/2018  Glucose 70 - 99 mg/dL 188(H) 158(H) 364(H)  BUN 6 - 20 mg/dL 17 12 21(H)  Creatinine 0.61 - 1.24 mg/dL 1.13 1.10 0.95  Sodium 135 - 145 mmol/L 136 137 133(L)  Potassium 3.5 - 5.1 mmol/L 4.6 4.3 5.0  Chloride 98 - 111 mmol/L 101 105 101  CO2 22 - 32 mmol/L 25 23 20(L)  Calcium 8.9 - 10.3 mg/dL 8.8(L) 8.9 9.1  Total Protein 6.5 - 8.1 g/dL - - -  Total Bilirubin 0.3 - 1.2 mg/dL - - -  Alkaline Phos 38 - 126 U/L - - -  AST 15 - 41 U/L - - -  ALT 17 - 63 U/L - - -  Discharge Instructions: Discharge Instructions    Discharge instructions   Complete by:  As directed    It was a pleasure of taking care of you while you were in the hospital, Mr. Gernert!  1. You were hospitalized for an asthma exacerbation which was probably caused by an upper respiratory viral illness. Your breathing sounds much better today and you are safe for discharge home.   2. Use either your albuterol/proventil nebulizer or your Proair inhaler every four hours for the next 1-2 days. Continue to use your Dulera twice a day. You should also take 3 more days of the steroid prednisone. Your blood sugars may be elevated while you're on that medication. You can take Mucinex, which is found over the counter, for your cough if it continues to be bothersome.   3. When you use your inhaler, you should use a spacer. We have provided you with that today.   4. Please follow-up in the internal medicine clinic on the ground floor of the hospital below the emergency department next Wednesday.  Feel free to call our clinic at [phone] if you have any questions.  Thanks, Dr. Annie Paras   Increase activity slowly   Complete by:  As directed       Signed: Olivianna Higley, Andree Elk, MD 10/24/2018, 10:44 AM   Pager: [phone]

## 2018-10-29 ENCOUNTER — Telehealth: Payer: Self-pay | Admitting: *Deleted

## 2018-10-29 ENCOUNTER — Emergency Department (HOSPITAL_COMMUNITY)
Admission: EM | Admit: 2018-10-29 | Discharge: 2018-10-29 | Disposition: A | Discharge status: Home / Self Care | Payer: Medicare Other | Attending: Emergency Medicine | Admitting: Emergency Medicine

## 2018-10-29 ENCOUNTER — Other Ambulatory Visit: Payer: Self-pay

## 2018-10-29 ENCOUNTER — Ambulatory Visit (INDEPENDENT_AMBULATORY_CARE_PROVIDER_SITE_OTHER): Payer: Medicare Other | Admitting: Internal Medicine

## 2018-10-29 ENCOUNTER — Encounter: Payer: Self-pay | Admitting: Internal Medicine

## 2018-10-29 ENCOUNTER — Encounter (HOSPITAL_COMMUNITY): Payer: Self-pay | Admitting: *Deleted

## 2018-10-29 VITALS — BP 110/62 | HR 93 | Temp 98.5°F | Ht 75.0 in | Wt 238.5 lb

## 2018-10-29 DIAGNOSIS — I10 Essential (primary) hypertension: Secondary | ICD-10-CM

## 2018-10-29 DIAGNOSIS — R0602 Shortness of breath: Secondary | ICD-10-CM | POA: Diagnosis not present

## 2018-10-29 DIAGNOSIS — J4540 Moderate persistent asthma, uncomplicated: Secondary | ICD-10-CM

## 2018-10-29 DIAGNOSIS — Z7951 Long term (current) use of inhaled steroids: Secondary | ICD-10-CM

## 2018-10-29 DIAGNOSIS — E119 Type 2 diabetes mellitus without complications: Secondary | ICD-10-CM | POA: Diagnosis not present

## 2018-10-29 DIAGNOSIS — Z79899 Other long term (current) drug therapy: Secondary | ICD-10-CM

## 2018-10-29 DIAGNOSIS — R079 Chest pain, unspecified: Secondary | ICD-10-CM

## 2018-10-29 DIAGNOSIS — I2510 Atherosclerotic heart disease of native coronary artery without angina pectoris: Secondary | ICD-10-CM

## 2018-10-29 DIAGNOSIS — J441 Chronic obstructive pulmonary disease with (acute) exacerbation: Secondary | ICD-10-CM

## 2018-10-29 DIAGNOSIS — R072 Precordial pain: Secondary | ICD-10-CM

## 2018-10-29 NOTE — Telephone Encounter (Signed)
Pt calls and states he was on his way to appt in clinic, was short of breath due to walk being so far to clinic, couldn't speak for a few minutes and was taken to ED by security. He wants to come to clinic and feels much better, he is informed he has the choice to decline ED treatment but if nurse intervenes on his behalf it must be done a certain way. He states he is coming down to clinic and tells nurse he will speak for himself

## 2018-10-29 NOTE — Assessment & Plan Note (Signed)
Chest pain seems to be related to his respiratory disease and . He had a cardiac cath in 2017 which revealed <10% stenosis, however he does have multiple risk factors and may have progression of CAD since then.   EKG obtained in the ED triage reveals sinus rhythm, J point elevation consistent with priors, no q waves and no TWI.  He has f/u with PCP in 1 month. If still present would consider stress test. Advised patient to rtc sooner or go to the ED if symptoms change or progress.

## 2018-10-29 NOTE — ED Triage Notes (Signed)
Pt in c/o worsened shortness of breath and cough, pt seen for same a few days ago, was headed down for a MD appointment and couldn't make it, felt like he was getting very tired and weak, speaking in full sentences on arrival

## 2018-10-29 NOTE — ED Triage Notes (Signed)
Pt states he is going to leave ED and go on to his MD appointment he is feeling better now

## 2018-10-29 NOTE — Assessment & Plan Note (Signed)
Patient with recent COPD with asthma exacerbation requiring short hospitalization. He has completed 5d of steroids and is compliant with his dulera, singuilair, and xyzal regimen. Overall improved, except for significant shortness of breath on walk to our clinic in very cold weather which improved with very short rest and no other intevention.   On exam he is speaking in full sentences and lungs are clear w/o wheezing.   Plan: --continue current regimen; advised to rtc if symptoms worsen --discussed pulmonary rehab to help improve his functional status; he is interested however wants his PCP's recommendation before placing referral.

## 2018-10-29 NOTE — Progress Notes (Signed)
   CC: COPD exacerbation  HPI:  [name] is a 58 y.o. with a PMH of non-obstructive CAD, COPD with asthma, HTN, T2DM presenting to clinic for hospital follow up for COPD exacerbation.  Patient admitted for 2 days for COPD exacerbation likely related to URI; he received magnesium, 2xd azithromycin (stopped due to low CRP), and completed 5 days of prednisone 40mg  daily (confirmed by pill count by patient as pill count on script was inaccurate). He states he feels significantly better, with almost complete resolution of cough; denies fevers, chills. He endorses regular use of Dulera BID, singulair daily, and xyzal daily.    He endorses some shortness of breath if he climbs a flight of stairs or walks for an extended period of time. This shortness of breath if it progresses leads to central chest pressure and tightness, same in character as when he gets a COPD exacerbation. Pain does not radiate, and is not associated with radiation, palpitations, nausea, vomiting, dizziness, or diaphoresis.   On his way from the bus stop to our clinic this morning, patient began having shortness of breath, and was unable to speak due to dyspnea. As it got progressively worse, he began having the above described chest pain. Security found him and helped him to the ED. By the time patient was in the ED, his shortness of breath and chest pain resolved and he declined evaluation by the ED. He was able to walk the short distance to our clinic w/o issue.   Patient expresses a lot of despair over wanting to remain independent; he feels his children are trying to help too much. Patient is tearful when recounting this.    Please see problem based Assessment and Plan for status of patients chronic conditions.  Past Medical History:  Diagnosis Date  . Asthma   . Chronic bronchitis ([place])   . Clostridium difficile colitis 09/09/2014  . COPD (chronic obstructive pulmonary disease) ([place])   . Eczema   . Essential  hypertension   . 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 Hyperlipidemia   . Neuromuscular disorder ([facility])    neuropathy in feet  . 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 Substance abuse ([place])    crack - recovered x 30 yrs  . Type II diabetes mellitus ([place])    diagnosed in 2013    Review of Systems:   Per HPI  Physical Exam:  Vitals:   10/29/18 1129 10/29/18 1201 10/29/18 1205  BP: (!) 150/89 110/62   Pulse: 93    Temp: 98.5 F (36.9 C)    TempSrc: Oral    SpO2: 100%  98%  Weight: 238 lb 8 oz (108.2 kg)    Height: 6\' 3"  (1.905 m)     GENERAL- alert, co-operative, appears as stated age, tearful throughout much of visit. HEENT- oral mucosa appears moist. CARDIAC- RRR, no murmurs, rubs or gallops. RESP- Speaking in full sentences. Moving equal volumes of air, and clear to auscultation bilaterally, no wheezes or crackles. ABDOMEN- Soft, nontender, bowel sounds present. EXTREMITIES- pulse 2+ radial and PT, symmetric, no pedal edema.  Assessment & Plan:   See Encounters Tab for problem based charting.   Patient discussed with Dr. Abelardo Diesel, MD Internal Medicine PGY-3

## 2018-10-30 NOTE — Progress Notes (Signed)
Internal Medicine Clinic Attending  Case discussed with Dr. Svalina  at the time of the visit.  We reviewed the resident's history and exam and pertinent patient test results.  I agree with the assessment, diagnosis, and plan of care documented in the resident's note.  

## 2018-10-31 ENCOUNTER — Other Ambulatory Visit: Payer: Self-pay | Admitting: *Deleted

## 2018-10-31 MED ORDER — METFORMIN HCL 1000 MG PO TABS: 1000.0000 mg | ORAL_TABLET | Freq: Two times a day (BID) | ORAL | 3 refills | Status: DC

## 2018-10-31 MED ORDER — ATORVASTATIN CALCIUM 40 MG PO TABS: 40.0000 mg | ORAL_TABLET | Freq: Every day | ORAL | 3 refills | Status: DC

## 2018-10-31 MED ORDER — LEVOCETIRIZINE DIHYDROCHLORIDE 5 MG PO TABS: 5.0000 mg | ORAL_TABLET | Freq: Every evening | ORAL | 3 refills | Status: DC

## 2018-11-05 ENCOUNTER — Other Ambulatory Visit: Payer: Self-pay | Admitting: *Deleted

## 2018-11-05 DIAGNOSIS — J9859 Other diseases of mediastinum, not elsewhere classified: Secondary | ICD-10-CM

## 2018-11-07 NOTE — Telephone Encounter (Signed)
Pt seen as scheduled on 10/29/18 for HFU.Richard [name], Jakin Pavao Cassady1/31/202011:17 AM

## 2018-11-25 ENCOUNTER — Encounter (HOSPITAL_COMMUNITY): Payer: Self-pay

## 2018-11-25 ENCOUNTER — Emergency Department (HOSPITAL_COMMUNITY): Payer: Medicare Other

## 2018-11-25 ENCOUNTER — Emergency Department (HOSPITAL_COMMUNITY)
Admission: EM | Admit: 2018-11-25 | Discharge: 2018-11-25 | Disposition: A | Discharge status: Home / Self Care | Payer: Medicare Other | Attending: Emergency Medicine | Admitting: Emergency Medicine

## 2018-11-25 DIAGNOSIS — I10 Essential (primary) hypertension: Secondary | ICD-10-CM | POA: Diagnosis not present

## 2018-11-25 DIAGNOSIS — Z7984 Long term (current) use of oral hypoglycemic drugs: Secondary | ICD-10-CM | POA: Insufficient documentation

## 2018-11-25 DIAGNOSIS — W19XXXA Unspecified fall, initial encounter: Secondary | ICD-10-CM

## 2018-11-25 DIAGNOSIS — S39012A Strain of muscle, fascia and tendon of lower back, initial encounter: Secondary | ICD-10-CM | POA: Diagnosis not present

## 2018-11-25 DIAGNOSIS — E119 Type 2 diabetes mellitus without complications: Secondary | ICD-10-CM | POA: Insufficient documentation

## 2018-11-25 DIAGNOSIS — Z79899 Other long term (current) drug therapy: Secondary | ICD-10-CM | POA: Diagnosis not present

## 2018-11-25 DIAGNOSIS — Y939 Activity, unspecified: Secondary | ICD-10-CM | POA: Insufficient documentation

## 2018-11-25 DIAGNOSIS — J449 Chronic obstructive pulmonary disease, unspecified: Secondary | ICD-10-CM | POA: Diagnosis not present

## 2018-11-25 DIAGNOSIS — R52 Pain, unspecified: Secondary | ICD-10-CM | POA: Diagnosis not present

## 2018-11-25 DIAGNOSIS — Y999 Unspecified external cause status: Secondary | ICD-10-CM | POA: Insufficient documentation

## 2018-11-25 DIAGNOSIS — Z87891 Personal history of nicotine dependence: Secondary | ICD-10-CM | POA: Diagnosis not present

## 2018-11-25 DIAGNOSIS — X501XXA Overexertion from prolonged static or awkward postures, initial encounter: Secondary | ICD-10-CM | POA: Insufficient documentation

## 2018-11-25 DIAGNOSIS — M546 Pain in thoracic spine: Secondary | ICD-10-CM | POA: Diagnosis not present

## 2018-11-25 DIAGNOSIS — M545 Low back pain: Secondary | ICD-10-CM | POA: Diagnosis not present

## 2018-11-25 DIAGNOSIS — Z7982 Long term (current) use of aspirin: Secondary | ICD-10-CM | POA: Diagnosis not present

## 2018-11-25 DIAGNOSIS — Y929 Unspecified place or not applicable: Secondary | ICD-10-CM | POA: Diagnosis not present

## 2018-11-25 MED ORDER — METHOCARBAMOL 500 MG PO TABS: 1000.0000 mg | ORAL_TABLET | Freq: Three times a day (TID) | ORAL | 0 refills | Status: DC | PRN

## 2018-11-25 MED ORDER — KETOROLAC TROMETHAMINE 60 MG/2ML IM SOLN
60.0000 mg | Freq: Once | INTRAMUSCULAR | Status: AC
  Administered 2018-11-25: 60 mg via INTRAMUSCULAR
  Filled 2018-11-25: qty 2

## 2018-11-25 MED ORDER — IBUPROFEN 600 MG PO TABS: 600.0000 mg | ORAL_TABLET | Freq: Four times a day (QID) | ORAL | 0 refills | Status: DC | PRN

## 2018-11-25 MED ORDER — METHOCARBAMOL 500 MG PO TABS
1000.0000 mg | ORAL_TABLET | Freq: Once | ORAL | Status: AC
  Administered 2018-11-25: 1000 mg via ORAL
  Filled 2018-11-25: qty 2

## 2018-11-25 NOTE — ED Triage Notes (Signed)
[facility]- [place]. C-collar in place. Pt reports lower back pain. AOx4. Tearful on arrival and appears to be in significant pain.   140/80 HR 80 CBG 163 SPo2 100%

## 2018-11-25 NOTE — ED Notes (Signed)
Patient verbalizes understanding of discharge instructions. Opportunity for questioning and answers were provided. Armband removed by staff, pt discharged from ED ambulatory.   

## 2018-11-25 NOTE — ED Notes (Signed)
Patient transported to X-ray 
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
## 2018-11-28 ENCOUNTER — Other Ambulatory Visit: Payer: Self-pay

## 2018-11-28 ENCOUNTER — Ambulatory Visit (INDEPENDENT_AMBULATORY_CARE_PROVIDER_SITE_OTHER): Payer: Medicare Other | Admitting: Internal Medicine

## 2018-11-28 ENCOUNTER — Encounter: Payer: Self-pay | Admitting: Internal Medicine

## 2018-11-28 DIAGNOSIS — S39012D Strain of muscle, fascia and tendon of lower back, subsequent encounter: Secondary | ICD-10-CM | POA: Diagnosis not present

## 2018-11-28 DIAGNOSIS — W01198A Fall on same level from slipping, tripping and stumbling with subsequent striking against other object, initial encounter: Secondary | ICD-10-CM | POA: Diagnosis not present

## 2018-11-28 DIAGNOSIS — S39012A Strain of muscle, fascia and tendon of lower back, initial encounter: Secondary | ICD-10-CM | POA: Diagnosis not present

## 2018-11-28 DIAGNOSIS — Y92811 Bus as the place of occurrence of the external cause: Secondary | ICD-10-CM

## 2018-11-28 MED ORDER — KETOROLAC TROMETHAMINE 30 MG/ML IJ SOLN
60.0000 mg | Freq: Once | INTRAMUSCULAR | Status: AC
  Administered 2018-11-28: 60 mg via INTRAMUSCULAR

## 2018-11-28 NOTE — Progress Notes (Signed)
   CC: Back pain  HPI:  [name] is a 58 y.o. male with past medical history outlined below here for back pain. For the details of today's visit, please refer to the assessment and plan.  Past Medical History:  Diagnosis Date  . Asthma   . Chronic bronchitis ([place])   . Clostridium difficile colitis 09/09/2014  . COPD (chronic obstructive pulmonary disease) ([place])   . Eczema   . Essential hypertension   . 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 Hyperlipidemia   . Neuromuscular disorder (H[facility])    neuropathy in feet  . 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 Substance abuse ([place])    crack - recovered x 30 yrs  . Type II diabetes mellitus ([place])    diagnosed in 2013    Review of Systems  Gastrointestinal:       No fecal incontinence   Genitourinary:       No urinary incontinence   Musculoskeletal: Positive for back pain and falls.    Physical Exam:  Vitals:   11/28/18 1356  BP: 126/67  Pulse: 81  Temp: 98.9 F (37.2 C)  TempSrc: Oral  SpO2: 100%  Weight: 242 lb 4.8 oz (109.9 kg)  Height: 6\' 3"  (1.905 m)    Constitutional: NAD, appears comfortable Cardiovascular: RRR, no m/r/g Extremities: Warm and well perfused. No edema.  MSK: Diffuse tenderness to palpation of bilateral lumbar paraspinal muscles, range of motion and strength examination limited due to pain.  Sensation to light touch intact bilaterally. Skin: No rashes or erythema  Psychiatric: Normal mood and affect  Assessment & Plan:   See Encounters Tab for problem based charting.  Patient discussed with Dr. Daryll Drown

## 2018-11-28 NOTE — Patient Instructions (Signed)
Richard [name],  I am sorry to hear about your fall. Please continue to take your robaxin and your ibuprofen as previously prescribed. You may alternate the ibuprofen with tylenol if you need additional relief. I have referred you to physical therapy, you will be called to schedule an appointment. If you have any questions or concerns, call our clinic at [phone] or after hours call [phone] and ask for the internal medicine resident on call. Thank you!  Dr. Philipp Ovens    Low Back Sprain  A sprain is a stretch or tear in the bands of tissue that hold bones and joints together (ligaments). Sprains of the lower back (lumbar spine) are a common cause of low back pain. A sprain occurs when ligaments are overextended or stretched beyond their limits. The ligaments can become inflamed, resulting in pain and sudden muscle tightening (spasms). A sprain can be caused by an injury (trauma), or it can develop gradually due to overuse. There are three types of sprains:  Grade 1 is a mild sprain involving an overstretched ligament or a very slight tear of the ligament.  Grade 2 is a moderate sprain involving a partial tear of the ligament.  Grade 3 is a severe sprain involving a complete tear of the ligament. What are the causes? This condition may be caused by:  Trauma, such as a fall or a hit to the body.  Twisting or overstretching the back. This may result from doing activities that require a lot of energy, such as lifting heavy objects. What increases the risk? The following factors may increase your risk of getting this condition:  Playing contact sports.  Participating in sports or activities that put excessive stress on the back and require a lot of bending and twisting, including: ? Lifting weights or heavy objects. ? Gymnastics. ? Soccer. ? Figure skating. ? Snowboarding.  Being overweight or obese.  Having poor strength and flexibility. What are the signs or symptoms? Symptoms  of this condition may include:  Sharp or dull pain in the lower back that does not go away. Pain may extend to the buttocks.  Stiffness.  Limited range of motion.  Inability to stand up straight due to stiffness or pain.  Muscle spasms. How is this diagnosed? This condition may be diagnosed based on:  Your symptoms.  Your medical history.  A physical exam. ? Your health care provider may push on certain areas of your back to determine the source of your pain. ? You may be asked to bend forward, backward, and side to side to assess the severity of your pain and your range of motion.  Imaging tests, such as: ? X-rays. ? MRI. How is this treated? Treatment for this condition may include:  Applying heat and cold to the affected area.  Medicines to help relieve pain and to relax your muscles (muscle relaxants).  NSAIDs to help reduce swelling and discomfort.  Physical therapy. When your symptoms improve, it is important to gradually return to your normal routine as soon as possible to reduce pain, avoid stiffness, and avoid loss of muscle strength. Generally, symptoms should improve within 6 weeks of treatment. However, recovery time varies. Follow these instructions at home: Managing pain, stiffness, and swelling   If directed, apply ice to the injured area during the first 24 hours after your injury. ? Put ice in a plastic bag. ? Place a towel between your skin and the bag. ? Leave the ice on for 20 minutes, 2-3 times a  day.  If directed, apply heat to the affected area as often as told by your health care provider. Use the heat source that your health care provider recommends, such as a moist heat pack or a heating pad. ? Place a towel between your skin and the heat source. ? Leave the heat on for 20-30 minutes. ? Remove the heat if your skin turns bright red. This is especially important if you are unable to feel pain, heat, or cold. You may have a greater risk of  getting burned. Activity  Rest and return to your normal activities as told by your health care provider. Ask your health care provider what activities are safe for you.  Avoid activities that take a lot of effort (are strenuous) for as long as told by your health care provider.  Do exercises as told by your health care provider. General instructions   Take over-the-counter and prescription medicines only as told by your health care provider.  If you have questions or concerns about safety while taking pain medicine, talk with your health care provider.  Do not drive or operate heavy machinery until you know how your pain medicine affects you.  Do not use any tobacco products, such as cigarettes, chewing tobacco, and e-cigarettes. Tobacco can delay bone healing. If you need help quitting, ask your health care provider.  Keep all follow-up visits as told by your health care provider. This is important. How is this prevented?  Warm up and stretch before being active.  Cool down and stretch after being active.  Give your body time to rest between periods of activity.  Avoid: ? Being physically inactive for long periods at a time. ? Exercising or playing sports when you are tired or in pain.  Use correct form when playing sports and lifting heavy objects.  Use good posture when sitting and standing.  Maintain a healthy weight.  Sleep on a mattress with medium firmness to support your back.  Make sure to use equipment that fits you, including shoes that fit well.  Be safe and responsible while being active to avoid falls.  Do at least 150 minutes of moderate-intensity exercise each week, such as brisk walking or water aerobics. Try a form of exercise that takes stress off your back, such as swimming or stationary cycling.  Maintain physical fitness, including: ? Strength. In particular, develop and maintain strong abdominal muscles. ? Flexibility. ? Cardiovascular  fitness. ? Endurance. Contact a health care provider if:  Your back pain does not improve after 6 weeks of treatment.  Your symptoms get worse. Get help right away if:  Your back pain is severe.  You are unable to stand or walk.  You develop pain in your legs.  You develop weakness in your buttocks or legs.  You have difficulty controlling when you urinate or when you have a bowel movement. This information is not intended to replace advice given to you by your health care provider. Make sure you discuss any questions you have with your health care provider. Document Released: 09/24/2005 Document Revised: 05/31/2016 Document Reviewed: 07/06/2015 Elsevier Interactive Patient Education  2019 [employer].

## 2018-11-28 NOTE — Assessment & Plan Note (Signed)
Patient is here with persistent back pain after fall from standing height 4 days ago. He tripped getting on the bus and was falling forward, but twisted his back at the last minute to avoid hitting his face. He was seen in the ED immediately following with lumbar and thoracic plain films that were negative for acute fracture.  He was prescribed Robaxin and ibuprofen and discharged home.  He has been taking ibuprofen regularly every 6 hours and Robaxin every 8 hours.  The medication helps initially but wears off before the next dose.  He denies urinary and fecal incontinence.  He does endorse some numbness down his posterior left thigh.  On exam, range of motion and strength are limited secondary to pain.  Sensation to light touch is intact.  He has diffuse tenderness to palpation of his paraspinal lumbar muscles.  Discussed with patient that he has a lumbar sprain and may take up to 6 weeks to improve.  Offered tramadol prescription, patient declined.  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  He has been sober now for many years but does not take opioid pain medications. Advised patient alternate ibuprofen with Tylenol for additional relief if needed.  I have referred him to physical therapy for stretching exercises.  He was given an IM injection of Toradol here in clinic, which was effective in the ER.  --Education on lumbar sprain -- IM Toradol 60 mg x 1 -Continue ibuprofen and Robaxin, add Tylenol as needed - Referred to physical therapy -Follow-up as needed

## 2018-12-01 ENCOUNTER — Telehealth: Payer: Self-pay | Admitting: Dietician

## 2018-12-01 NOTE — Progress Notes (Signed)
Internal Medicine Clinic Attending  Case discussed with Dr. Guilloud at the time of the visit.  We reviewed the resident's history and exam and pertinent patient test results.  I agree with the assessment, diagnosis, and plan of care documented in the resident's note.  

## 2018-12-01 NOTE — Telephone Encounter (Signed)
Richard [name] is interested in redoing CGM.It will be three months from the last professional Continuous glucose monitoring in early April. He agrees to schedule that in middle of March.

## 2018-12-04 ENCOUNTER — Other Ambulatory Visit: Payer: Self-pay

## 2018-12-04 ENCOUNTER — Ambulatory Visit (INDEPENDENT_AMBULATORY_CARE_PROVIDER_SITE_OTHER): Payer: Medicare Other | Admitting: Internal Medicine

## 2018-12-04 ENCOUNTER — Encounter: Payer: Self-pay | Admitting: Internal Medicine

## 2018-12-04 ENCOUNTER — Ambulatory Visit: Payer: Medicare Other | Admitting: Dietician

## 2018-12-04 VITALS — BP 128/81 | HR 80 | Temp 97.9°F | Ht 75.0 in | Wt 244.6 lb

## 2018-12-04 DIAGNOSIS — I700 Atherosclerosis of aorta: Secondary | ICD-10-CM

## 2018-12-04 DIAGNOSIS — E1165 Type 2 diabetes mellitus with hyperglycemia: Secondary | ICD-10-CM

## 2018-12-04 DIAGNOSIS — E1140 Type 2 diabetes mellitus with diabetic neuropathy, unspecified: Secondary | ICD-10-CM

## 2018-12-04 DIAGNOSIS — IMO0002 Reserved for concepts with insufficient information to code with codable children: Secondary | ICD-10-CM

## 2018-12-04 DIAGNOSIS — M5136 Other intervertebral disc degeneration, lumbar region: Secondary | ICD-10-CM

## 2018-12-04 DIAGNOSIS — E1142 Type 2 diabetes mellitus with diabetic polyneuropathy: Secondary | ICD-10-CM | POA: Diagnosis not present

## 2018-12-04 DIAGNOSIS — E1139 Type 2 diabetes mellitus with other diabetic ophthalmic complication: Secondary | ICD-10-CM

## 2018-12-04 DIAGNOSIS — X58XXXD Exposure to other specified factors, subsequent encounter: Secondary | ICD-10-CM

## 2018-12-04 DIAGNOSIS — J9859 Other diseases of mediastinum, not elsewhere classified: Secondary | ICD-10-CM

## 2018-12-04 DIAGNOSIS — S39012D Strain of muscle, fascia and tendon of lower back, subsequent encounter: Secondary | ICD-10-CM

## 2018-12-04 DIAGNOSIS — Z7982 Long term (current) use of aspirin: Secondary | ICD-10-CM

## 2018-12-04 DIAGNOSIS — E11319 Type 2 diabetes mellitus with unspecified diabetic retinopathy without macular edema: Secondary | ICD-10-CM | POA: Diagnosis not present

## 2018-12-04 DIAGNOSIS — Z7984 Long term (current) use of oral hypoglycemic drugs: Secondary | ICD-10-CM

## 2018-12-04 DIAGNOSIS — E1151 Type 2 diabetes mellitus with diabetic peripheral angiopathy without gangrene: Secondary | ICD-10-CM | POA: Diagnosis not present

## 2018-12-04 DIAGNOSIS — I10 Essential (primary) hypertension: Secondary | ICD-10-CM | POA: Diagnosis not present

## 2018-12-04 DIAGNOSIS — Z8631 Personal history of diabetic foot ulcer: Secondary | ICD-10-CM

## 2018-12-04 DIAGNOSIS — Z79899 Other long term (current) drug therapy: Secondary | ICD-10-CM

## 2018-12-04 DIAGNOSIS — F17201 Nicotine dependence, unspecified, in remission: Secondary | ICD-10-CM

## 2018-12-04 DIAGNOSIS — I739 Peripheral vascular disease, unspecified: Secondary | ICD-10-CM

## 2018-12-04 LAB — POCT GLYCOSYLATED HEMOGLOBIN (HGB A1C): Hemoglobin A1C: 8.1 % — AB (ref 4.0–5.6)

## 2018-12-04 LAB — GLUCOSE, CAPILLARY: Glucose-Capillary: 119 mg/dL — ABNORMAL HIGH (ref 70–99)

## 2018-12-04 MED ORDER — PIOGLITAZONE HCL 30 MG PO TABS: 30.0000 mg | ORAL_TABLET | Freq: Every day | ORAL | 3 refills | Status: DC

## 2018-12-04 NOTE — Progress Notes (Signed)
Subjective:  HPI: [name] is a 58 y.o. male who presents for f/u DM  Please see Assessment and Plan below for the status of his chronic medical problems.  Review of Systems: ROS  Objective:  Physical Exam: Vitals:   12/04/18 0833  BP: 128/81  Pulse: 80  Temp: 97.9 F (36.6 C)  TempSrc: Oral  SpO2: 99%  Weight: 244 lb 9.6 oz (110.9 kg)  Height: 6\' 3"  (1.905 m)   Body mass index is 30.57 kg/m. Physical Exam Vitals signs and nursing note reviewed.  HENT:     Head: Normocephalic and atraumatic.  Cardiovascular:     Rate and Rhythm: Normal rate and regular rhythm.  Pulmonary:     Effort: Pulmonary effort is normal.     Breath sounds: Normal breath sounds. No wheezing.  Abdominal:     General: Abdomen is flat.     Palpations: Abdomen is soft.  Musculoskeletal:     Lumbar back: He exhibits tenderness and pain. He exhibits normal range of motion and no spasm.     Right lower leg: No edema.     Left lower leg: No edema.  Skin:    General: Skin is warm and dry.  Psychiatric:        Mood and Affect: Mood normal.    Assessment & Plan:  Essential hypertension HPI: No current issues.  Assessment essential hypertension  Plan Continue Jardiance 10 mg daily  Abdominal aortic atherosclerosis (HCC) Chronic and stable continue risk factor modification  Peripheral arterial disease (HCC) HPI: His diabetic foot ulcer has now healed.  He is not limited in walking due to any claudication symptoms and overall feels like he is doing well.  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  Overall feels he is doing well with the addition of Actos.  Assessment uncontrolled type 2 diabetes with diabetic  retinopathy, diabetic peripheral neuropathy, diabetic peripheral arterial disease  Plan Continue Jardiance 10 mg daily-plan to keep this at low-dose to avoid side effects Continue metformin 1000 mg twice daily Continue Trulicity 1.5 mg weekly Increase Actos to 30 mg daily Continue Lyrica 100 mg 3 times daily  Mediastinal mass He is due for repeat chest CT and follow-up with Dr. Darcey Nora in March he is aware of these appointments and has no further questions.  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  Lumbar strain HPI: He had a recent visit to the ED after hurting his back stepping off a bus.  Reviewed x-rays with him in the office today discussed that he does have degenerative disc disease but no acute problems with his spine.  I do suspect the injury as muscular it has been improving with Robaxin and nonsteroidals.   Assessment subacute back pain secondary to lumbar strain  Plan Discussed continuing nonsteroidals for the short-term and continuing Robaxin discussed adding heat therapy.  And discussed time course of improvement.  He also has been referred to physical therapy which I think he will benefit from.   Medications Ordered Meds ordered this encounter  Medications  . pioglitazone (ACTOS) 30 MG tablet    Sig: Take 1 tablet (30 mg total) by  mouth daily.    Dispense:  90 tablet    Refill:  3    Dose change, Place on file.   Other Orders Orders Placed This Encounter  Procedures  . Glucose, capillary  . POC Hbg A1C   Follow Up: Return in about 3 months (around 03/04/2019).

## 2018-12-04 NOTE — Patient Instructions (Signed)
I want you to increase Pioglitazone (Actos) to 30mg  a day (take 2 of the 15mg  tablets)

## 2018-12-05 NOTE — Assessment & Plan Note (Signed)
Chronic and stable continue risk factor modification

## 2018-12-05 NOTE — Assessment & Plan Note (Signed)
He is due for repeat chest CT and follow-up with Dr. Darcey Nora in March he is aware of these appointments and has no further questions.

## 2018-12-05 NOTE — Assessment & Plan Note (Addendum)
HPI: He denies any polyuria or polydipsia has not had any significant hypoglycemia episodes.  Overall feels he is doing well with the addition of Actos.  Assessment uncontrolled type 2 diabetes with diabetic retinopathy, diabetic peripheral neuropathy, diabetic peripheral arterial disease  Plan Continue Jardiance 10 mg daily-plan to keep this at low-dose to avoid side effects Continue metformin 1000 mg twice daily Continue Trulicity 1.5 mg weekly Increase Actos to 30 mg daily Continue Lyrica 100 mg 3 times daily

## 2018-12-05 NOTE — Assessment & Plan Note (Signed)
HPI: No current issues.  Assessment essential hypertension  Plan Continue Jardiance 10 mg daily 

## 2018-12-05 NOTE — Assessment & Plan Note (Signed)
HPI: He had a recent visit to the ED after hurting his back stepping off a bus.  Reviewed x-rays with him in the office today discussed that he does have degenerative disc disease but no acute problems with his spine.  I do suspect the injury as muscular it has been improving with Robaxin and nonsteroidals.   Assessment subacute back pain secondary to lumbar strain  Plan Discussed continuing nonsteroidals for the short-term and continuing Robaxin discussed adding heat therapy.  And discussed time course of improvement.  He also has been referred to physical therapy which I think he will benefit from.

## 2018-12-05 NOTE — Assessment & Plan Note (Signed)
He has stopped smoking he does feel better after doing so I congratulated him and encouraged him to keep up the good work.

## 2018-12-05 NOTE — Assessment & Plan Note (Signed)
HPI: His diabetic foot ulcer has now healed.  He is not limited in walking due to any claudication symptoms and overall feels like he is doing well.  Assessment peripheral arterial disease  Plan Continue atorvastatin 40 mg daily Aspirin 81 mg daily

## 2018-12-12 ENCOUNTER — Ambulatory Visit: Payer: Medicare Other | Attending: Internal Medicine | Admitting: Physical Therapy

## 2018-12-12 ENCOUNTER — Other Ambulatory Visit: Payer: Self-pay

## 2018-12-12 ENCOUNTER — Encounter: Payer: Self-pay | Admitting: Physical Therapy

## 2018-12-12 ENCOUNTER — Ambulatory Visit (INDEPENDENT_AMBULATORY_CARE_PROVIDER_SITE_OTHER): Payer: Medicare Other | Admitting: Podiatry

## 2018-12-12 DIAGNOSIS — L84 Corns and callosities: Secondary | ICD-10-CM

## 2018-12-12 DIAGNOSIS — M79674 Pain in right toe(s): Secondary | ICD-10-CM | POA: Diagnosis not present

## 2018-12-12 DIAGNOSIS — M6281 Muscle weakness (generalized): Secondary | ICD-10-CM

## 2018-12-12 DIAGNOSIS — M79675 Pain in left toe(s): Secondary | ICD-10-CM

## 2018-12-12 DIAGNOSIS — B351 Tinea unguium: Secondary | ICD-10-CM | POA: Diagnosis not present

## 2018-12-12 DIAGNOSIS — E1142 Type 2 diabetes mellitus with diabetic polyneuropathy: Secondary | ICD-10-CM | POA: Diagnosis not present

## 2018-12-12 DIAGNOSIS — M5442 Lumbago with sciatica, left side: Secondary | ICD-10-CM | POA: Diagnosis not present

## 2018-12-12 NOTE — Patient Instructions (Signed)
Diabetes Mellitus and Foot Care Foot care is an important part of your health, especially when you have diabetes. Diabetes may cause you to have problems because of poor blood flow (circulation) to your feet and legs, which can cause your skin to:  Become thinner and drier.  Break more easily.  Heal more slowly.  Peel and crack. You may also have nerve damage (neuropathy) in your legs and feet, causing decreased feeling in them. This means that you may not notice minor injuries to your feet that could lead to more serious problems. Noticing and addressing any potential problems early is the best way to prevent future foot problems. How to care for your feet Foot hygiene  Wash your feet daily with warm water and mild soap. Do not use hot water. Then, pat your feet and the areas between your toes until they are completely dry. Do not soak your feet as this can dry your skin.  Trim your toenails straight across. Do not dig under them or around the cuticle. File the edges of your nails with an emery board or nail file.  Apply a moisturizing lotion or petroleum jelly to the skin on your feet and to dry, brittle toenails. Use lotion that does not contain alcohol and is unscented. Do not apply lotion between your toes. Shoes and socks  Wear clean socks or stockings every day. Make sure they are not too tight. Do not wear knee-high stockings since they may decrease blood flow to your legs.  Wear shoes that fit properly and have enough cushioning. Always look in your shoes before you put them on to be sure there are no objects inside.  To break in new shoes, wear them for just a few hours a day. This prevents injuries on your feet. Wounds, scrapes, corns, and calluses  Check your feet daily for blisters, cuts, bruises, sores, and redness. If you cannot see the bottom of your feet, use a mirror or ask someone for help.  Do not cut corns or calluses or try to remove them with medicine.  If you  find a minor scrape, cut, or break in the skin on your feet, keep it and the skin around it clean and dry. You may clean these areas with mild soap and water. Do not clean the area with peroxide, alcohol, or iodine.  If you have a wound, scrape, corn, or callus on your foot, look at it several times a day to make sure it is healing and not infected. Check for: ? Redness, swelling, or pain. ? Fluid or blood. ? Warmth. ? Pus or a bad smell. General instructions  Do not cross your legs. This may decrease blood flow to your feet.  Do not use heating pads or hot water bottles on your feet. They may burn your skin. If you have lost feeling in your feet or legs, you may not know this is happening until it is too late.  Protect your feet from hot and cold by wearing shoes, such as at the beach or on hot pavement.  Schedule a complete foot exam at least once a year (annually) or more often if you have foot problems. If you have foot problems, report any cuts, sores, or bruises to your health care provider immediately. Contact a health care provider if:  You have a medical condition that increases your risk of infection and you have any cuts, sores, or bruises on your feet.  You have an injury that is not   healing.  You have redness on your legs or feet.  You feel burning or tingling in your legs or feet.  You have pain or cramps in your legs and feet.  Your legs or feet are numb.  Your feet always feel cold.  You have pain around a toenail. Get help right away if:  You have a wound, scrape, corn, or callus on your foot and: ? You have pain, swelling, or redness that gets worse. ? You have fluid or blood coming from the wound, scrape, corn, or callus. ? Your wound, scrape, corn, or callus feels warm to the touch. ? You have pus or a bad smell coming from the wound, scrape, corn, or callus. ? You have a fever. ? You have a red line going up your leg. Summary  Check your feet every day  for cuts, sores, red spots, swelling, and blisters.  Moisturize feet and legs daily.  Wear shoes that fit properly and have enough cushioning.  If you have foot problems, report any cuts, sores, or bruises to your health care provider immediately.  Schedule a complete foot exam at least once a year (annually) or more often if you have foot problems. This information is not intended to replace advice given to you by your health care provider. Make sure you discuss any questions you have with your health care provider. Document Released: 09/21/2000 Document Revised: 11/06/2017 Document Reviewed: 10/26/2016 Elsevier Interactive Patient Education  2019 [employer]  Corns and Calluses Corns are small areas of thickened skin that occur on the top, sides, or tip of a toe. They contain a cone-shaped core with a point that can press on a nerve below. This causes pain.  Calluses are areas of thickened skin that can occur anywhere on the body, including the hands, fingers, palms, soles of the feet, and heels. Calluses are usually larger than corns. What are the causes? Corns and calluses are caused by rubbing (friction) or pressure, such as from shoes that are too tight or do not fit properly. What increases the risk? Corns are more likely to develop in people who have misshapen toes (toe deformities), such as hammer toes. Calluses can occur with friction to any area of the skin. They are more likely to develop in people who:  Work with their hands.  Wear shoes that fit poorly, are too tight, or are high-heeled.  Have toe deformities. What are the signs or symptoms? Symptoms of a corn or callus include:  A hard growth on the skin.  Pain or tenderness under the skin.  Redness and swelling.  Increased discomfort while wearing tight-fitting shoes, if your feet are affected. If a corn or callus becomes infected, symptoms may include:  Redness and swelling that gets worse.  Pain.   Fluid, blood, or pus draining from the corn or callus. How is this diagnosed? Corns and calluses may be diagnosed based on your symptoms, your medical history, and a physical exam. How is this treated? Treatment for corns and calluses may include:  Removing the cause of the friction or pressure. This may involve: ? Changing your shoes. ? Wearing shoe inserts (orthotics) or other protective layers in your shoes, such as a corn pad. ? Wearing gloves.  Applying medicine to the skin (topical medicine) to help soften skin in the hardened, thickened areas.  Removing layers of dead skin with a file to reduce the size of the corn or callus.  Removing the corn or callus with a scalpel   or laser.  Taking antibiotic medicines, if your corn or callus is infected.  Having surgery, if a toe deformity is the cause. Follow these instructions at home:   Take over-the-counter and prescription medicines only as told by your health care provider.  If you were prescribed an antibiotic, take it as told by your health care provider. Do not stop taking it even if your condition starts to improve.  Wear shoes that fit well. Avoid wearing high-heeled shoes and shoes that are too tight or too loose.  Wear any padding, protective layers, gloves, or orthotics as told by your health care provider.  Soak your hands or feet and then use a file or pumice stone to soften your corn or callus. Do this as told by your health care provider.  Check your corn or callus every day for symptoms of infection. Contact a health care provider if you:  Notice that your symptoms do not improve with treatment.  Have redness or swelling that gets worse.  Notice that your corn or callus becomes painful.  Have fluid, blood, or pus coming from your corn or callus.  Have new symptoms. Summary  Corns are small areas of thickened skin that occur on the top, sides, or tip of a toe.  Calluses are areas of thickened skin that  can occur anywhere on the body, including the hands, fingers, palms, and soles of the feet. Calluses are usually larger than corns.  Corns and calluses are caused by rubbing (friction) or pressure, such as from shoes that are too tight or do not fit properly.  Treatment may include wearing any padding, protective layers, gloves, or orthotics as told by your health care provider. This information is not intended to replace advice given to you by your health care provider. Make sure you discuss any questions you have with your health care provider. Document Released: 06/30/2004 Document Revised: 08/07/2017 Document Reviewed: 08/07/2017 Elsevier Interactive Patient Education  2019 [employer]  Onychomycosis/Fungal Toenails  WHAT IS IT? An infection that lies within the keratin of your nail plate that is caused by a fungus.  WHY ME? Fungal infections affect all ages, sexes, races, and creeds.  There may be many factors that predispose you to a fungal infection such as age, coexisting medical conditions such as diabetes, or an autoimmune disease; stress, medications, fatigue, genetics, etc.  Bottom line: fungus thrives in a warm, moist environment and your shoes offer such a location.  IS IT CONTAGIOUS? Theoretically, yes.  You do not want to share shoes, nail clippers or files with someone who has fungal toenails.  Walking around barefoot in the same room or sleeping in the same bed is unlikely to transfer the organism.  It is important to realize, however, that fungus can spread easily from one nail to the next on the same foot.  HOW DO WE TREAT THIS?  There are several ways to treat this condition.  Treatment may depend on many factors such as age, medications, pregnancy, liver and kidney conditions, etc.  It is best to ask your doctor which options are available to you.  1. No treatment.   Unlike many other medical concerns, you can live with this condition.  However for many people this can be  a painful condition and may lead to ingrown toenails or a bacterial infection.  It is recommended that you keep the nails cut short to help reduce the amount of fungal nail. 2. Topical treatment.  These range from herbal remedies   to prescription strength nail lacquers.  About 40-50% effective, topicals require twice daily application for approximately 9 to 12 months or until an entirely new nail has grown out.  The most effective topicals are medical grade medications available through physicians offices. 3. Oral antifungal medications.  With an 80-90% cure rate, the most common oral medication requires 3 to 4 months of therapy and stays in your system for a year as the new nail grows out.  Oral antifungal medications do require blood work to make sure it is a safe drug for you.  A liver function panel will be performed prior to starting the medication and after the first month of treatment.  It is important to have the blood work performed to avoid any harmful side effects.  In general, this medication safe but blood work is required. 4. Laser Therapy.  This treatment is performed by applying a specialized laser to the affected nail plate.  This therapy is noninvasive, fast, and non-painful.  It is not covered by insurance and is therefore, out of pocket.  The results have been very good with a 80-95% cure rate.  [facility] is the only practice in the area to offer this therapy. 5. Permanent Nail Avulsion.  Removing the entire nail so that a new nail will not grow back. 

## 2018-12-12 NOTE — Patient Instructions (Signed)
Access Code: [email or web address]  URL: [email or web address]  Date: 12/12/2018  Prepared by: Beaulah Dinning   Exercises  Lying Prone - 1 reps - 1 sets - 2-3 minutes hold - 2-3x daily - 7x weekly  Static Prone on Elbows - 1 reps - 1 sets - 2 minutes hold - 2-3x daily - 7x weekly  Supine Posterior Pelvic Tilt - 10 reps - 2 sets - 3-5 seconds hold - 2x daily - 7x weekly  Supine Lower Trunk Rotation - 10 reps - 1-2 sets - 5-10 seconds hold - 2x daily - 7x weekly
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
## 2018-12-15 ENCOUNTER — Other Ambulatory Visit: Payer: Self-pay | Admitting: Internal Medicine

## 2018-12-15 NOTE — Telephone Encounter (Signed)
pregabalin (LYRICA) 100 MG capsule, refill request @  [facility] [place], [place] AT [address] [place] [phone] (Phone) [phone] (Fax)

## 2018-12-16 ENCOUNTER — Other Ambulatory Visit: Payer: Self-pay

## 2018-12-16 NOTE — Telephone Encounter (Signed)
Went this am

## 2018-12-16 NOTE — Telephone Encounter (Signed)
Pt states pregabalin (LYRICA) 100 MG capsule, is not at the pharmacy. Please call back.

## 2018-12-16 NOTE — Telephone Encounter (Signed)
I called [facility] - rx is ready for pick up. Called pt who was informed.

## 2018-12-22 ENCOUNTER — Encounter: Payer: Self-pay | Admitting: Podiatry

## 2018-12-22 NOTE — Progress Notes (Signed)
Subjective: Richard [name] presents with diabetes, diabetic neuropathy and cc of painful, discolored, thick toenails and painful callus/corn which interfere with activities of daily living. Pain is aggravated when wearing enclosed shoe gear. Pain is relieved with periodic professional debridement.  Richard Groves, DO is his PCP. Last visit was 12/04/2018.  Richard [name] has h/o ulceration submet head 3 right foot which has healed.   Richard [name] is very happy that he has not developed a wound and says he continues to wear his diabetic insoles daily.   He is enjoying time spent with his granddaughter.   Current Outpatient Medications:  .  ACCU-CHEK FASTCLIX LANCETS MISC, Check blood sugar two times a day, Disp: 204 each, Rfl: 5 .  albuterol (PROAIR HFA) 108 (90 Base) MCG/ACT inhaler, Inhale 2 puffs into the lungs every 4 (four) hours as needed for wheezing or shortness of breath., Disp: , Rfl:  .  albuterol (PROVENTIL) (2.5 MG/3ML) 0.083% nebulizer solution, Take 3 mLs (2.5 mg total) by nebulization every 6 (six) hours as needed for wheezing or shortness of breath., Disp: 75 mL, Rfl: 11 .  aspirin EC 81 MG EC tablet, Take 1 tablet (81 mg total) by mouth daily., Disp: 30 tablet, Rfl: 3 .  atorvastatin (LIPITOR) 40 MG tablet, Take 1 tablet (40 mg total) by mouth daily., Disp: 90 tablet, Rfl: 3 .  Blood Glucose Monitoring Suppl (ACCU-CHEK GUIDE) w/Device KIT, 1 each by Does not apply route 2 (two) times daily., Disp: 1 kit, Rfl: 1 .  diclofenac sodium (VOLTAREN) 1 % GEL, Apply 2-4 g topically 4 (four) times daily. Apply 2g to right shoulder, 4 grams to left knee. (Patient taking differently: Apply 2-4 g topically as needed. Apply 2g to right shoulder, 4 grams to left knee.), Disp: 100 g, Rfl: 3 .  Dulaglutide (TRULICITY) 1.5 XJ/1.5ZM SOPN, Inject 1.5 mg into the skin once a week. (Patient taking differently: Inject 1.5 mg into the skin every Friday. ), Disp: 12 pen, Rfl: 3 .  empagliflozin (JARDIANCE)  10 MG TABS tablet, Take 10 mg by mouth daily., Disp: 90 tablet, Rfl: 3 .  glucose blood (ACCU-CHEK GUIDE) test strip, Check blood sugar two times a day, Disp: 200 each, Rfl: 5 .  ibuprofen (ADVIL,MOTRIN) 600 MG tablet, Take 1 tablet (600 mg total) by mouth every 6 (six) hours as needed., Disp: 30 tablet, Rfl: 0 .  Lancets Misc. (UNISTIK 2 NORMAL) MISC, Check blood sugar two times a day, Disp: , Rfl:  .  levocetirizine (XYZAL) 5 MG tablet, Take 1 tablet (5 mg total) by mouth every evening., Disp: 90 tablet, Rfl: 3 .  metFORMIN (GLUCOPHAGE) 1000 MG tablet, Take 1 tablet (1,000 mg total) by mouth 2 (two) times daily with a meal., Disp: 180 tablet, Rfl: 3 .  methocarbamol (ROBAXIN) 500 MG tablet, Take 2 tablets (1,000 mg total) by mouth every 8 (eight) hours as needed for muscle spasms., Disp: 30 tablet, Rfl: 0 .  mometasone-formoterol (DULERA) 200-5 MCG/ACT AERO, Inhale 2 puffs into the lungs 2 (two) times daily., Disp: 13 g, Rfl: 3 .  montelukast (SINGULAIR) 10 MG tablet, TAKE 1 TABLET(10 MG) BY MOUTH AT BEDTIME (Patient taking differently: Take 10 mg by mouth at bedtime. ), Disp: 30 tablet, Rfl: 5 .  Multiple Vitamins-Minerals (MULTIVITAMIN ADULT PO), Take 1 tablet by mouth daily., Disp: , Rfl:  .  nystatin (MYCOSTATIN/NYSTOP) powder, Apply between 4th and 5th digits of both feet once daily. (Patient taking differently: Apply 1 Bottle topically at bedtime.  Apply between 4th and 5th digits of both feet once daily.), Disp: 30 g, Rfl: 0 .  pioglitazone (ACTOS) 30 MG tablet, Take 1 tablet (30 mg total) by mouth daily., Disp: 90 tablet, Rfl: 3 .  polyethylene glycol (MIRALAX) packet, Take 17 g by mouth daily. (Patient not taking: Reported on 10/23/2018), Disp: 14 each, Rfl: 0 .  pregabalin (LYRICA) 100 MG capsule, TAKE 1 CAPSULE(100 MG) BY MOUTH THREE TIMES DAILY, Disp: 90 capsule, Rfl: 5 .  timolol (TIMOPTIC) 0.5 % ophthalmic solution, Place 1 drop into both eyes 2 (two) times daily., Disp: , Rfl: 10 .   triamcinolone ointment (KENALOG) 0.1 %, Apply 1 application topically 2 (two) times daily as needed., Disp: 80 g, Rfl: 5  No Known Allergies  Vascular Examination: Capillary refill time <3 seconds x 10 digits.  Dorsalis pedis and Posterior tibial pulses present b/l.  No digital hair x 10 digits.  Skin temperature WNL b/l.  Dermatological Examination: Skin with normal turgor, texture and tone b/l.  Toenails 1-5 b/l discolored, thick, dystrophic with subungual debris and pain with palpation to nailbeds due to thickness of nails.  Hyperkeratotic lesion submetatarsal head 3 right foot with subdermal hemorrhage. No erythema, no edema, no drainage, no flocculence noted.  No pain on palpation. Measures 3.0 x 2.5 cm and remains closed.  Musculoskeletal: Muscle strength 5/5 to all LE muscle groups  Neurological: Sensation absent with 10 gram monofilament.  Vibratory sensation absent b/l.  Assessment: 1. Painful onychomycosis toenails 1-5 b/l 2. Preulcerative callus submet head 3 right foot 3. NIDDM with Diabetic neuropathy  Plan: 1. Continue diabetic foot care principles. Literature dispensed on today. 2. Toenails 1-5 b/l were debrided in length and girth without iatrogenic bleeding. Calluses pared submetatarsal head(s) 3 right foot utilizing sterile scalpel blade and burr without incident. 3. Patient to continue diabetic shoes daily. 4. Patient to report any pedal injuries to medical professional  5. Follow up 9 weeks.  6. Patient/POA to call should there be a concern in the interim.

## 2018-12-23 ENCOUNTER — Other Ambulatory Visit: Payer: Self-pay

## 2018-12-24 ENCOUNTER — Ambulatory Visit (INDEPENDENT_AMBULATORY_CARE_PROVIDER_SITE_OTHER): Payer: Medicare Other | Admitting: Cardiothoracic Surgery

## 2018-12-24 ENCOUNTER — Ambulatory Visit
Admission: RE | Admit: 2018-12-24 | Discharge: 2018-12-24 | Disposition: A | Discharge status: Home / Self Care | Payer: Medicare Other | Source: Ambulatory Visit | Attending: Cardiothoracic Surgery | Admitting: Cardiothoracic Surgery

## 2018-12-24 ENCOUNTER — Other Ambulatory Visit: Payer: Medicare Other

## 2018-12-24 ENCOUNTER — Encounter: Payer: Self-pay | Admitting: Cardiothoracic Surgery

## 2018-12-24 VITALS — BP 103/64 | HR 85 | Resp 16 | Ht 75.0 in | Wt 245.0 lb

## 2018-12-24 DIAGNOSIS — J9859 Other diseases of mediastinum, not elsewhere classified: Secondary | ICD-10-CM

## 2018-12-24 DIAGNOSIS — J479 Bronchiectasis, uncomplicated: Secondary | ICD-10-CM | POA: Diagnosis not present
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
## 2018-12-29 ENCOUNTER — Ambulatory Visit: Payer: Medicare Other | Admitting: Physical Therapy

## 2019-01-01 ENCOUNTER — Other Ambulatory Visit: Payer: Self-pay

## 2019-01-01 MED ORDER — TRIAMCINOLONE ACETONIDE 0.1 % EX OINT: 1.0000 "application " | TOPICAL_OINTMENT | Freq: Two times a day (BID) | CUTANEOUS | 5 refills | Status: DC | PRN

## 2019-01-01 NOTE — Telephone Encounter (Signed)
triamcinolone ointment (KENALOG) 0.1 %      Refill request @  [place], [place] AT [place] [phone] (Phone) [phone] (Fax)

## 2019-01-02 ENCOUNTER — Ambulatory Visit: Payer: Medicare Other | Admitting: Physical Therapy

## 2019-01-05 ENCOUNTER — Ambulatory Visit: Payer: Medicare Other | Admitting: Physical Therapy

## 2019-01-07 ENCOUNTER — Telehealth: Payer: Self-pay | Admitting: Dietician

## 2019-01-07 NOTE — Telephone Encounter (Signed)
[name] calls and asks for Continuous glucose monitoring(cgm). I discussed safety precautions for Covid-19 the hospital is taking and he verbalized understanding. Appointment schedule for cgm sensor placement for tomorrow ar 915 am.

## 2019-01-08 ENCOUNTER — Telehealth: Payer: Self-pay | Admitting: Physical Therapy

## 2019-01-08 ENCOUNTER — Ambulatory Visit (INDEPENDENT_AMBULATORY_CARE_PROVIDER_SITE_OTHER): Payer: Medicare Other | Admitting: Dietician

## 2019-01-08 ENCOUNTER — Other Ambulatory Visit: Payer: Self-pay

## 2019-01-08 ENCOUNTER — Encounter: Payer: Self-pay | Admitting: Dietician

## 2019-01-08 DIAGNOSIS — E118 Type 2 diabetes mellitus with unspecified complications: Secondary | ICD-10-CM

## 2019-01-08 DIAGNOSIS — E1165 Type 2 diabetes mellitus with hyperglycemia: Principal | ICD-10-CM

## 2019-01-08 DIAGNOSIS — E1139 Type 2 diabetes mellitus with other diabetic ophthalmic complication: Secondary | ICD-10-CM | POA: Diagnosis not present

## 2019-01-08 DIAGNOSIS — IMO0002 Reserved for concepts with insufficient information to code with codable children: Secondary | ICD-10-CM

## 2019-01-08 LAB — GLUCOSE, CAPILLARY: Glucose-Capillary: 155 mg/dL — ABNORMAL HIGH (ref 70–99)

## 2019-01-08 NOTE — Patient Instructions (Signed)
Please record the time, amount and what food drinks and activities you have while wearing the continuous glucose monitor (CGM). ? ?Bring the folder with you to follow up appointments. If your monitor falls off, please place it in the bag provided in your folder and bring it back with you to your next appointment.  ? ?Do not have a CT or an MRI while wearing the CGM.  ? ?1 week visit has been set up with me and a doctor for the first of two CGM downloads.  ? ?You will also return in 2 weeks to have your second download and the CGM removed. ? ?Vennessa Affinito, RD ?[phone] ? ?

## 2019-01-08 NOTE — Telephone Encounter (Signed)
Richard [name] was contacted today regarding the temporary reduction of OP Rehab Services due to concerns for community transmission of Covid-19.    Therapist advised the patient to continue to perform their HEP and assured they had no unanswered questions at this time.  The patient expressed interest in being contacted for an e-visit, virtual check in, or telehealth visit to continue their POC care, when those services become available.      Outpatient Rehabilitation Services will follow up with patients at that time.

## 2019-01-08 NOTE — Progress Notes (Signed)
  Documentation for Freestyle Libre Pro Continuous glucose monitoring Appointment start time: 9:30 End time: 9:45  Freestyle Libre Pro CGM sensor placed today. Patient was educated about wearing sensor, keeping food, activity and medication log and when to call office. Patient was educated about how to care for the sensor and not to have an MRI, CT or Diathermy while wearing the sensor. Follow up was arranged with the patient for 1 week and 2 weeks.   [name] reports taking his diabetes medicines and is able to name them. He did not bring his meter today. He requested a CBG. He reports his only intake today has been coffee with cream and sugar.   CBG (last 3)  Recent Labs    01/08/19 0942  GLUCAP 155*   Lot #: [id number] Serial #: [id number] Expiration Date: 06/08/2019  Debera Lat, RD 01/08/2019 10:14 AM.

## 2019-01-09 ENCOUNTER — Ambulatory Visit: Payer: Medicare Other | Admitting: Physical Therapy

## 2019-01-12 ENCOUNTER — Encounter: Payer: Medicare Other | Admitting: Dietician

## 2019-01-12 ENCOUNTER — Ambulatory Visit: Payer: Medicare Other | Admitting: Physical Therapy

## 2019-01-15 ENCOUNTER — Encounter: Payer: Medicare Other | Admitting: Dietician

## 2019-01-15 ENCOUNTER — Ambulatory Visit: Payer: Medicare Other

## 2019-01-19 ENCOUNTER — Encounter: Payer: Medicare Other | Admitting: Dietician

## 2019-01-19 ENCOUNTER — Encounter: Payer: Medicare Other | Admitting: Physical Therapy

## 2019-01-20 ENCOUNTER — Telehealth: Payer: Self-pay | Admitting: Dietician

## 2019-01-20 NOTE — Telephone Encounter (Signed)
CGM 2 week download appointment reminder.

## 2019-01-22 ENCOUNTER — Encounter: Payer: Self-pay | Admitting: Dietician

## 2019-01-22 ENCOUNTER — Ambulatory Visit (INDEPENDENT_AMBULATORY_CARE_PROVIDER_SITE_OTHER): Payer: Medicare Other | Admitting: Dietician

## 2019-01-22 ENCOUNTER — Ambulatory Visit (INDEPENDENT_AMBULATORY_CARE_PROVIDER_SITE_OTHER): Payer: Medicare Other | Admitting: Internal Medicine

## 2019-01-22 ENCOUNTER — Other Ambulatory Visit: Payer: Self-pay | Admitting: Dietician

## 2019-01-22 ENCOUNTER — Other Ambulatory Visit: Payer: Self-pay

## 2019-01-22 VITALS — BP 124/69 | HR 84 | Temp 98.0°F | Ht 75.0 in | Wt 245.2 lb

## 2019-01-22 DIAGNOSIS — Z713 Dietary counseling and surveillance: Secondary | ICD-10-CM | POA: Diagnosis not present

## 2019-01-22 DIAGNOSIS — IMO0002 Reserved for concepts with insufficient information to code with codable children: Secondary | ICD-10-CM

## 2019-01-22 DIAGNOSIS — Z7984 Long term (current) use of oral hypoglycemic drugs: Secondary | ICD-10-CM

## 2019-01-22 DIAGNOSIS — E1139 Type 2 diabetes mellitus with other diabetic ophthalmic complication: Secondary | ICD-10-CM

## 2019-01-22 DIAGNOSIS — E1165 Type 2 diabetes mellitus with hyperglycemia: Principal | ICD-10-CM

## 2019-01-22 NOTE — Progress Notes (Signed)
  Medical Nutrition Therapy:  Appt start time: 0925 end time:  0945. Visit # 5  Assessment:  Primary concerns today: blood sugar control and now weight loss-.  Mr. Fendley does not like the weight he has gained (increased 30# in the last 1 year). He is working on changes to his diet to lower his weight and wants to move more. He is self directed in his behavior change. He thinks he eats healthy, but his portions are too big. He thinks he is eating out of boredom. He is unable to get as much activity with Covid-19 restrictions or go to the gym.  ANTHROPOMETRICS:  Wt Readings from Last 5 Encounters:  12/24/18 245 lb (111.1 kg)  12/04/18 244 lb 9.6 oz (110.9 kg)  11/28/18 242 lb 4.8 oz (109.9 kg)  10/29/18 238 lb 8 oz (108.2 kg)  10/23/18 233 lb 4 oz (105.8 kg)   MEDICATIONS: taking as directed BLOOD SUGAR: CGM results discussed with him and Dr.Helberg today. Glycemic Management Indicator is 6.8% from CGM results which shows improvement. No hypoglycemia Lab Results  Component Value Date   HGBA1C 8.1 (A) 12/04/2018   Usual eating pattern- three meals a day with dinner being his heaviest. However, breakfast still causes the biggest rise in his blood sugars(peaks at 194) . He attributes this to coffee with powdered creamer and sugar 2 teaspoons. He does his own shopping and cooking.  Everyday foods include coffee, chicken and pasta salad, fruit, sandwiches, regular soda, juice, eggs, cheese,grilled chicken , rice  Progress Towards Goal(s):  Some progress.   Nutritional Diagnosis:  NI-5.8.2 Excessive carbohydrate intake As related to increased appetite/quarantine  As evidenced by his report and weight gain of 12# in the past 3 months.    Intervention:  Nutrition education and counseling about his goals, options to achieving those goals. Education/reveiw about Continuous glucose monitoring results.   Coordination of care: consider oral Ozempic for weight loss /glycemic control.  Handouts given  during visit include:AVS Barriers to learning/adherence to lifestyle change: competing values Demonstrated degree of understanding via:  Teach Back   Monitoring/Evaluation:  Dietary intake, exercise, meter, and body weight in 3 month(s). [name], RD 01/22/2019 10:05 AM.

## 2019-01-22 NOTE — Assessment & Plan Note (Signed)
HPI: Patient presented to the clinic after two weeks of wearing a CGM monitor. Over these 15 days he was noted to be on target range 82% of the time with only 17% of the time being above target. He had no episodes of hypoglycemia. His average glucose was 146. On reviewing the data it looks like he has morning peaks. This coincides with when he drinks his coffee. He adds three scoops of cream and two scoops of sugar to each coffee. He has been taking all his medications as prescribed. He states that since the gym is close due to the pandemic he is not been exercising as much. He has significantly changed his diet by increasing his vegetable intake and not consumption. He is no longer eating chips. He does not drink juice or sodas. He does feel that he is eating more with the social isolation.  Overall he very much enjoyed having a CGM and would like to have one permanently.  A/P: - Not due for A1c check  - Continue Jardiance 10 mg daily, Pioglitazone 30 mg daily, and metformin 1000 mg BID - Patient will continue diet lifestyle modifications. - Unfortunately patient does not qualify for long-term CGM monitoring with current insurance requirements - Check urine microalbumin

## 2019-01-22 NOTE — Patient Instructions (Addendum)
Good job on self managing blood sugars!!  :)  Consider trying 1 teaspoon sugar in coffee.   Talk to Dr. Heber [place] about pill form of [place] that usually helps with weight.   My schedule is not in for July yet.  You can repeat the CGM after July 4th. I'll send myself a reminder and call you around that time.   Butch Penny [phone]

## 2019-01-22 NOTE — Patient Instructions (Signed)
Thank you for allowing [place] to provide your care. Keep up the great work with your diabetes. Continue to exercise as much as possible. Try to limit how much cream and sugar you use in your coffee.  I will looking to see what [place] healthcare's requirements are to get a continuous glucose monitor. We will see you back in three months or sooner if anything arises.

## 2019-01-22 NOTE — Progress Notes (Signed)
   CC: DM  HPI:  [name] is a 58 y.o. male* with PMHx listed below presenting for DM. Please see the A&P for the status of the patient's chronic medical problems.  Past Medical History:  Diagnosis Date  . Asthma   . Chronic bronchitis ([place])   . Clostridium difficile colitis 09/09/2014  . COPD (chronic obstructive pulmonary disease) ([place])   . Eczema   . Essential hypertension   . 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 Hyperlipidemia   . Neuromuscular disorder ([facility])    neuropathy in feet  . 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 Substance abuse ([place])    crack - recovered x 30 yrs  . Type II diabetes mellitus ([place])    diagnosed in 2013   Review of Systems:  Performed and all others negative.  Physical Exam: Vitals:   01/22/19 0954  BP: 124/69  Pulse: 84  Temp: 98 F (36.7 C)  TempSrc: Oral  SpO2: 100%  Weight: 245 lb 3.2 oz (111.2 kg)  Height: 6\' 3"  (1.905 m)   General: Well nourished male in no acute distress Pulm: Good air movement with no wheezing or crackles  CV: RRR, no murmurs, no rubs  Abdomen: Active bowel sounds, soft, non-distended, no tenderness to palpation   Assessment & Plan:   See Encounters Tab for problem based charting.  Patient discussed with Dr. Beryle Beams

## 2019-01-22 NOTE — Progress Notes (Signed)
Continuous glucose monitoring referral per patient request.

## 2019-01-22 NOTE — Progress Notes (Signed)
Medicine attending: Medical history, presenting problems,  and medications, reviewed with resident physician Dr Justin Helberg on the day of the patient telephone consultation and I concur with his evaluation and management plan. 

## 2019-01-23 ENCOUNTER — Ambulatory Visit: Payer: Medicare Other | Admitting: Physical Therapy

## 2019-01-23 LAB — MICROALBUMIN / CREATININE URINE RATIO
Creatinine, Urine: 89.3 mg/dL
Microalb/Creat Ratio: 5 mg/g creat (ref 0–29)
Microalbumin, Urine: 4.4 ug/mL

## 2019-02-05 ENCOUNTER — Other Ambulatory Visit: Payer: Self-pay | Admitting: Internal Medicine

## 2019-02-05 ENCOUNTER — Telehealth: Payer: Self-pay | Admitting: Physical Therapy

## 2019-02-05 MED ORDER — DULAGLUTIDE 1.5 MG/0.5ML ~~LOC~~ SOAJ: 1.5000 mg | SUBCUTANEOUS | 3 refills | Status: DC

## 2019-02-05 NOTE — Telephone Encounter (Signed)
Needs refill on Dulaglutide (TRULICITY) 1.5 XT/0.5WP SOPN [id number] - [place], [place] [address] ;pt contact [phone]

## 2019-02-05 NOTE — Telephone Encounter (Signed)
Richard [name] was contacted today regarding transition if in-person OP Rehab Services to telehealth due to Covid-19. Pt consented to telehealth services, educated on MyChart signup, [employer], and was agreeable to receive information via (text/email) regarding telehealth services. Pt consented and was scheduled for appointment.

## 2019-02-13 ENCOUNTER — Ambulatory Visit: Payer: Medicare Other | Admitting: Podiatry

## 2019-02-13 ENCOUNTER — Ambulatory Visit (INDEPENDENT_AMBULATORY_CARE_PROVIDER_SITE_OTHER): Payer: Medicare Other | Admitting: Podiatry

## 2019-02-13 ENCOUNTER — Encounter: Payer: Self-pay | Admitting: Podiatry

## 2019-02-13 ENCOUNTER — Other Ambulatory Visit: Payer: Self-pay

## 2019-02-13 VITALS — Temp 98.1°F

## 2019-02-13 DIAGNOSIS — M79675 Pain in left toe(s): Secondary | ICD-10-CM

## 2019-02-13 DIAGNOSIS — M79674 Pain in right toe(s): Secondary | ICD-10-CM

## 2019-02-13 DIAGNOSIS — L84 Corns and callosities: Secondary | ICD-10-CM

## 2019-02-13 DIAGNOSIS — B351 Tinea unguium: Secondary | ICD-10-CM | POA: Diagnosis not present

## 2019-02-13 DIAGNOSIS — E1142 Type 2 diabetes mellitus with diabetic polyneuropathy: Secondary | ICD-10-CM

## 2019-02-13 NOTE — Patient Instructions (Addendum)
Diabetes Mellitus and Foot Care Foot care is an important part of your health, especially when you have diabetes. Diabetes may cause you to have problems because of poor blood flow (circulation) to your feet and legs, which can cause your skin to:  Become thinner and drier.  Break more easily.  Heal more slowly.  Peel and crack. You may also have nerve damage (neuropathy) in your legs and feet, causing decreased feeling in them. This means that you may not notice minor injuries to your feet that could lead to more serious problems. Noticing and addressing any potential problems early is the best way to prevent future foot problems. How to care for your feet Foot hygiene  Wash your feet daily with warm water and mild soap. Do not use hot water. Then, pat your feet and the areas between your toes until they are completely dry. Do not soak your feet as this can dry your skin.  Trim your toenails straight across. Do not dig under them or around the cuticle. File the edges of your nails with an emery board or nail file.  Apply a moisturizing lotion or petroleum jelly to the skin on your feet and to dry, brittle toenails. Use lotion that does not contain alcohol and is unscented. Do not apply lotion between your toes. Shoes and socks  Wear clean socks or stockings every day. Make sure they are not too tight. Do not wear knee-high stockings since they may decrease blood flow to your legs.  Wear shoes that fit properly and have enough cushioning. Always look in your shoes before you put them on to be sure there are no objects inside.  To break in new shoes, wear them for just a few hours a day. This prevents injuries on your feet. Wounds, scrapes, corns, and calluses  Check your feet daily for blisters, cuts, bruises, sores, and redness. If you cannot see the bottom of your feet, use a mirror or ask someone for help.  Do not cut corns or calluses or try to remove them with medicine.  If you  find a minor scrape, cut, or break in the skin on your feet, keep it and the skin around it clean and dry. You may clean these areas with mild soap and water. Do not clean the area with peroxide, alcohol, or iodine.  If you have a wound, scrape, corn, or callus on your foot, look at it several times a day to make sure it is healing and not infected. Check for: ? Redness, swelling, or pain. ? Fluid or blood. ? Warmth. ? Pus or a bad smell. General instructions  Do not cross your legs. This may decrease blood flow to your feet.  Do not use heating pads or hot water bottles on your feet. They may burn your skin. If you have lost feeling in your feet or legs, you may not know this is happening until it is too late.  Protect your feet from hot and cold by wearing shoes, such as at the beach or on hot pavement.  Schedule a complete foot exam at least once a year (annually) or more often if you have foot problems. If you have foot problems, report any cuts, sores, or bruises to your health care provider immediately. Contact a health care provider if:  You have a medical condition that increases your risk of infection and you have any cuts, sores, or bruises on your feet.  You have an injury that is not   healing.  You have redness on your legs or feet.  You feel burning or tingling in your legs or feet.  You have pain or cramps in your legs and feet.  Your legs or feet are numb.  Your feet always feel cold.  You have pain around a toenail. Get help right away if:  You have a wound, scrape, corn, or callus on your foot and: ? You have pain, swelling, or redness that gets worse. ? You have fluid or blood coming from the wound, scrape, corn, or callus. ? Your wound, scrape, corn, or callus feels warm to the touch. ? You have pus or a bad smell coming from the wound, scrape, corn, or callus. ? You have a fever. ? You have a red line going up your leg. Summary  Check your feet every day  for cuts, sores, red spots, swelling, and blisters.  Moisturize feet and legs daily.  Wear shoes that fit properly and have enough cushioning.  If you have foot problems, report any cuts, sores, or bruises to your health care provider immediately.  Schedule a complete foot exam at least once a year (annually) or more often if you have foot problems. This information is not intended to replace advice given to you by your health care provider. Make sure you discuss any questions you have with your health care provider. Document Released: 09/21/2000 Document Revised: 11/06/2017 Document Reviewed: 10/26/2016 Elsevier Interactive Patient Education  2019 [employer]  Fungal Nail Infection A fungal nail infection is a common infection of the toenails or fingernails. This condition affects toenails more often than fingernails. It often affects the great, or big, toes. More than one nail may be infected. The condition can be passed from person to person (is contagious). What are the causes? This condition is caused by a fungus. Several types of fungi can cause the infection. These fungi are common in moist and warm areas. If your hands or feet come into contact with the fungus, it may get into a crack in your fingernail or toenail and cause the infection. What increases the risk? The following factors may make you more likely to develop this condition: Being male. Being of older age. Living with someone who has the fungus. Walking barefoot in areas where the fungus thrives, such as showers or locker rooms. Wearing shoes and socks that cause your feet to sweat. Having a nail injury or a recent nail surgery. Having certain medical conditions, such as: Athlete's foot. Diabetes. Psoriasis. Poor circulation. A weak body defense system (immune system). What are the signs or symptoms? Symptoms of this condition include: A pale spot on the nail. Thickening of the nail. A nail that becomes yellow  or brown. A brittle or ragged nail edge. A crumbling nail. A nail that has lifted away from the nail bed. How is this diagnosed? This condition is diagnosed with a physical exam. Your health care provider may take a scraping or clipping from your nail to test for the fungus. How is this treated? Treatment is not needed for mild infections. If you have significant nail changes, treatment may include: Antifungal medicines taken by mouth (orally). You may need to take the medicine for several weeks or several months, and you may not see the results for a long time. These medicines can cause side effects. Ask your health care provider what problems to watch for. Antifungal nail polish or nail cream. These may be used along with oral antifungal medicines. Laser treatment  of the nail. Surgery to remove the nail. This may be needed for the most severe infections. It can take a long time, usually up to a year, for the infection to go away. The infection may also come back. Follow these instructions at home: Medicines Take or apply over-the-counter and prescription medicines only as told by your health care provider. Ask your health care provider about using over-the-counter mentholated ointment on your nails. Nail care Trim your nails often. Wash and dry your hands and feet every day. Keep your feet dry: Wear absorbent socks, and change your socks frequently. Wear shoes that allow air to circulate, such as sandals or canvas tennis shoes. Throw out old shoes. Do not use artificial nails. If you go to a nail salon, make sure you choose one that uses clean instruments. Use antifungal foot powder on your feet and in your shoes. General instructions Do not share personal items, such as towels or nail clippers. Do not walk barefoot in shower rooms or locker rooms. Wear rubber gloves if you are working with your hands in wet areas. Keep all follow-up visits as told by your health care provider. This  is important. Contact a health care provider if: Your infection is not getting better or it is getting worse after several months. Summary A fungal nail infection is a common infection of the toenails or fingernails. Treatment is not needed for mild infections. If you have significant nail changes, treatment may include taking medicine orally and applying medicine to your nails. It can take a long time, usually up to a year, for the infection to go away. The infection may also come back. Take or apply over-the-counter and prescription medicines only as told by your health care provider. Follow instructions for taking care of your nails to help prevent infection from coming back or spreading. This information is not intended to replace advice given to you by your health care provider. Make sure you discuss any questions you have with your health care provider. Document Released: 09/21/2000 Document Revised: 02/28/2018 Document Reviewed: 02/28/2018 Elsevier Interactive Patient Education  2019 [employer].

## 2019-02-16 ENCOUNTER — Ambulatory Visit: Payer: Medicare Other | Attending: Internal Medicine | Admitting: Physical Therapy

## 2019-02-16 ENCOUNTER — Encounter: Payer: Self-pay | Admitting: Physical Therapy

## 2019-02-16 DIAGNOSIS — M6281 Muscle weakness (generalized): Secondary | ICD-10-CM | POA: Diagnosis not present

## 2019-02-16 DIAGNOSIS — M5442 Lumbago with sciatica, left side: Secondary | ICD-10-CM | POA: Diagnosis not present

## 2019-02-16 NOTE — Patient Instructions (Signed)
Access Code: [email or web address]  URL: [email or web address]  Date: 02/16/2019  Prepared by: Raeford Razor   Exercises  Seated Hamstring Stretch - 5 reps - 1 sets - 30 hold - 2x daily - 7x weekly  Seated Lumbar Flexion Stretch - 5 reps - 1 sets - 30 hold - 2x daily - 7x weekly  Seated Pelvic Tilts - 10 reps - 1 sets - 5 hold - 2x daily - 7x weekly  Seated Trunk Rotation - Arms Crossed - 10 reps - 1 sets - 5 hold - 2x daily - 7x weekly
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
## 2019-02-19 ENCOUNTER — Encounter: Payer: Self-pay | Admitting: Podiatry

## 2019-02-19 NOTE — Progress Notes (Signed)
Subjective: Richard [name] presents with diabetes, diabetic neuropathy and cc of painful, discolored, thick toenails and preulcerative callus which interfere with activities of daily living. Pain is aggravated when wearing enclosed shoe gear. Pain is relieved with periodic professional debridement.  He continues to wear his diabetic shoes/insoles.   He voices no new concerns on today's visit.  [name], DO is his PCP.    Current Outpatient Medications:  .  ACCU-CHEK FASTCLIX LANCETS MISC, Check blood sugar two times a day, Disp: 204 each, Rfl: 5 .  albuterol (PROAIR HFA) 108 (90 Base) MCG/ACT inhaler, Inhale 2 puffs into the lungs every 4 (four) hours as needed for wheezing or shortness of breath., Disp: , Rfl:  .  albuterol (PROVENTIL) (2.5 MG/3ML) 0.083% nebulizer solution, Take 3 mLs (2.5 mg total) by nebulization every 6 (six) hours as needed for wheezing or shortness of breath., Disp: 75 mL, Rfl: 11 .  aspirin EC 81 MG EC tablet, Take 1 tablet (81 mg total) by mouth daily., Disp: 30 tablet, Rfl: 3 .  atorvastatin (LIPITOR) 40 MG tablet, Take 1 tablet (40 mg total) by mouth daily., Disp: 90 tablet, Rfl: 3 .  Blood Glucose Monitoring Suppl (ACCU-CHEK GUIDE) w/Device KIT, 1 each by Does not apply route 2 (two) times daily., Disp: 1 kit, Rfl: 1 .  diclofenac sodium (VOLTAREN) 1 % GEL, Apply 2-4 g topically 4 (four) times daily. Apply 2g to right shoulder, 4 grams to left knee. (Patient taking differently: Apply 2-4 g topically as needed. Apply 2g to right shoulder, 4 grams to left knee.), Disp: 100 g, Rfl: 3 .  Dulaglutide (TRULICITY) 1.5 JG/8.1LX SOPN, Inject 1.5 mg into the skin once a week., Disp: 12 pen, Rfl: 3 .  empagliflozin (JARDIANCE) 10 MG TABS tablet, Take 10 mg by mouth daily., Disp: 90 tablet, Rfl: 3 .  glucose blood (ACCU-CHEK GUIDE) test strip, Check blood sugar two times a day, Disp: 200 each, Rfl: 5 .  ibuprofen (ADVIL,MOTRIN) 600 MG tablet, Take 1 tablet (600 mg total)  by mouth every 6 (six) hours as needed., Disp: 30 tablet, Rfl: 0 .  Lancets Misc. (UNISTIK 2 NORMAL) MISC, Check blood sugar two times a day, Disp: , Rfl:  .  levocetirizine (XYZAL) 5 MG tablet, Take 1 tablet (5 mg total) by mouth every evening., Disp: 90 tablet, Rfl: 3 .  metFORMIN (GLUCOPHAGE) 1000 MG tablet, Take 1 tablet (1,000 mg total) by mouth 2 (two) times daily with a meal., Disp: 180 tablet, Rfl: 3 .  methocarbamol (ROBAXIN) 500 MG tablet, Take 2 tablets (1,000 mg total) by mouth every 8 (eight) hours as needed for muscle spasms., Disp: 30 tablet, Rfl: 0 .  mometasone-formoterol (DULERA) 200-5 MCG/ACT AERO, Inhale 2 puffs into the lungs 2 (two) times daily., Disp: 13 g, Rfl: 3 .  montelukast (SINGULAIR) 10 MG tablet, TAKE 1 TABLET(10 MG) BY MOUTH AT BEDTIME (Patient taking differently: Take 10 mg by mouth at bedtime. ), Disp: 30 tablet, Rfl: 5 .  Multiple Vitamins-Minerals (MULTIVITAMIN ADULT PO), Take 1 tablet by mouth daily., Disp: , Rfl:  .  nystatin (MYCOSTATIN/NYSTOP) powder, Apply between 4th and 5th digits of both feet once daily. (Patient taking differently: Apply 1 Bottle topically at bedtime. Apply between 4th and 5th digits of both feet once daily.), Disp: 30 g, Rfl: 0 .  pioglitazone (ACTOS) 30 MG tablet, Take 1 tablet (30 mg total) by mouth daily., Disp: 90 tablet, Rfl: 3 .  pregabalin (LYRICA) 100 MG capsule, TAKE  1 CAPSULE(100 MG) BY MOUTH THREE TIMES DAILY, Disp: 90 capsule, Rfl: 5 .  timolol (TIMOPTIC) 0.5 % ophthalmic solution, Place 1 drop into both eyes 2 (two) times daily., Disp: , Rfl: 10 .  triamcinolone ointment (KENALOG) 0.1 %, Apply 1 application topically 2 (two) times daily as needed., Disp: 80 g, Rfl: 5  No Known Allergies  Objective: Vitals:   02/13/19 0814  Temp: 98.1 F (36.7 C)   Vascular Examination: Capillary refill time less than 3 seconds x 10 digits.  Dorsalis pedis pulses present b/l.  Posterior tibial pulses present b/l.  Digital hair  absent  x 10 digits.  Skin temperature gradient WNL b/l.  Dermatological Examination: Skin with normal turgor, texture and tone b/l.  Toenails 1-5 b/l discolored, thick, dystrophic with subungual debris and pain with palpation to nailbeds due to thickness of nails.  Hyperkeratotic lesion submet head 3 right foot, submet head 5 left foot. No erythema, no edema, no drainage, no flocculence noted.   Musculoskeletal: Muscle strength 5/5 to all LE muscle groups.  Neurological: Sensation absent  with 10 gram monofilament.  Assessment: 1. Painful onychomycosis toenails 1-5 b/l 2. Calluses submet head 3 right foot, submet head 5 left foot 3. NIDDM with Diabetic neuropathy  Plan: 1. Continue diabetic foot care principles. Literature dispensed on today. 2. Toenails 1-5 b/l were debrided in length and girth without iatrogenic bleeding. 3. Calluses pared submetatarsal head(s) 3 right foot, submet head 5 left foot  utilizing sterile scalpel blade without incident. Corn(s) pared utilizing sterile scalpel blade without incident.  4. Patient to continue soft, supportive shoe gear. 5. Patient to report any pedal injuries to medical professional  6. Follow up 3 months.  7. Patient/POA to call should there be a concern in the interim.

## 2019-02-23 ENCOUNTER — Ambulatory Visit: Payer: Medicare Other | Admitting: Physical Therapy

## 2019-02-26 ENCOUNTER — Ambulatory Visit: Payer: Medicare Other | Admitting: Physical Therapy

## 2019-02-26 ENCOUNTER — Other Ambulatory Visit: Payer: Self-pay | Admitting: *Deleted

## 2019-02-26 MED ORDER — EMPAGLIFLOZIN 10 MG PO TABS: 10.0000 mg | ORAL_TABLET | Freq: Every day | ORAL | 0 refills | Status: DC

## 2019-02-26 MED ORDER — ATORVASTATIN CALCIUM 40 MG PO TABS: 40.0000 mg | ORAL_TABLET | Freq: Every day | ORAL | 0 refills | Status: DC

## 2019-02-26 MED ORDER — PREGABALIN 100 MG PO CAPS: 100.0000 mg | ORAL_CAPSULE | Freq: Three times a day (TID) | ORAL | 0 refills | Status: DC

## 2019-02-26 MED ORDER — METFORMIN HCL 1000 MG PO TABS: 1000.0000 mg | ORAL_TABLET | Freq: Two times a day (BID) | ORAL | 0 refills | Status: DC

## 2019-03-05 ENCOUNTER — Ambulatory Visit: Payer: Medicare Other | Admitting: Internal Medicine

## 2019-03-05 ENCOUNTER — Ambulatory Visit: Payer: Medicare Other | Admitting: Dietician

## 2019-03-06 ENCOUNTER — Other Ambulatory Visit: Payer: Self-pay | Admitting: *Deleted

## 2019-03-06 NOTE — Telephone Encounter (Signed)
Insurance and pt request 90 refills

## 2019-03-09 MED ORDER — ATORVASTATIN CALCIUM 40 MG PO TABS: 40.0000 mg | ORAL_TABLET | Freq: Every day | ORAL | 0 refills | Status: DC

## 2019-03-09 MED ORDER — METFORMIN HCL 1000 MG PO TABS: 1000.0000 mg | ORAL_TABLET | Freq: Two times a day (BID) | ORAL | 0 refills | Status: DC

## 2019-03-10 ENCOUNTER — Telehealth: Payer: Self-pay

## 2019-03-10 NOTE — Telephone Encounter (Signed)
Return call to patient.  He is requesting a COVID test.  He states he just returned from [place] and his family is very nervous to be around him because he traveled and spent time out of the home.  Pt states he was visiting his sister and did not come in contact with anyone with COVID symptoms and maintained social distancing. He denies any s/s of COVID   Pt informed COVID testing was unpleasant, however, he verbalizes understanding and still wants to be tested.  Informed pt RN will send request to PCP to advise and will call him back.   NURSING TRIAGE NOTE FOR RESPIRATORY SYMPTOMS  Do you have a fever?  No  Do you have a cough? No Do you have shortness of breath more than normal? NO Do you have chest pain? NO Are you able to eat and drink normally? No Have you seen a physician for these symptoms? No  Action:  Sent to Dr. Heber [place] to advise

## 2019-03-10 NOTE — Telephone Encounter (Signed)
Requesting to speak with a nurse about getting COVID-19 test.

## 2019-03-11 NOTE — Telephone Encounter (Signed)
Gave patient call back, confirmed he is asymptomatic, discussed [facility] current COVID algorithm would not recommend testing.  Gave number for [facility] testing site at [facility].

## 2019-03-17 ENCOUNTER — Ambulatory Visit: Payer: Medicare Other | Attending: Internal Medicine | Admitting: Physical Therapy

## 2019-03-17 ENCOUNTER — Encounter (INDEPENDENT_AMBULATORY_CARE_PROVIDER_SITE_OTHER): Payer: Self-pay

## 2019-03-17 ENCOUNTER — Other Ambulatory Visit: Payer: Self-pay

## 2019-03-17 ENCOUNTER — Encounter: Payer: Self-pay | Admitting: Physical Therapy

## 2019-03-17 DIAGNOSIS — M5442 Lumbago with sciatica, left side: Secondary | ICD-10-CM | POA: Insufficient documentation

## 2019-03-17 DIAGNOSIS — M6281 Muscle weakness (generalized): Secondary | ICD-10-CM | POA: Insufficient documentation
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
## 2019-03-24 ENCOUNTER — Other Ambulatory Visit: Payer: Self-pay

## 2019-03-24 ENCOUNTER — Encounter: Payer: Self-pay | Admitting: Physical Therapy

## 2019-03-24 ENCOUNTER — Ambulatory Visit: Payer: Medicare Other | Admitting: Physical Therapy

## 2019-03-24 DIAGNOSIS — M5442 Lumbago with sciatica, left side: Secondary | ICD-10-CM

## 2019-03-24 DIAGNOSIS — M6281 Muscle weakness (generalized): Secondary | ICD-10-CM
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
## 2019-03-25 NOTE — Progress Notes (Signed)
Subjective:  HPI: [name] is a 58 y.o. male who presents for f/u HTN and DM  Please see Assessment and Plan below for the status of his chronic medical problems.  Review of Systems: Review of Systems  Constitutional: Negative for chills, fever and malaise/fatigue.  Cardiovascular: Negative for leg swelling.  Genitourinary: Negative for dysuria.  Musculoskeletal: Positive for joint pain.  Neurological: Negative for focal weakness.  Endo/Heme/Allergies: Negative for polydipsia.    Objective:  Physical Exam: Vitals:   03/26/19 0831  BP: 121/69  Pulse: 88  Resp: 20  Temp: 98.5 F (36.9 C)  TempSrc: Oral  SpO2: 100%  Weight: 246 lb 8 oz (111.8 kg)  Height: 6\' 3"  (1.905 m)   Body mass index is 30.81 kg/m. Physical Exam Vitals signs and nursing note reviewed.  Constitutional:      Appearance: Normal appearance.  Cardiovascular:     Rate and Rhythm: Normal rate and regular rhythm.     Heart sounds: No murmur.  Pulmonary:     Effort: Pulmonary effort is normal. No respiratory distress.     Breath sounds: Normal breath sounds. No wheezing.  Abdominal:     General: Abdomen is flat.     Palpations: Abdomen is soft.  Musculoskeletal:     Right shoulder: He exhibits tenderness and pain. He exhibits no swelling and no effusion.     Left knee: He exhibits effusion. He exhibits no swelling, no erythema and normal patellar mobility. Tenderness found. Medial joint line tenderness noted.     Right lower leg: No edema.     Left lower leg: No edema.     Comments: Painful arc, unable to abduct past 100deg, positive neer impingment  Neurological:     Mental Status: He is alert.    Assessment & Plan:  Essential hypertension HPI: No current issues.  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  He did recently visit his sister and [place] he notes that he ran out of some of the medications his sugars went higher up to 300 range during that time.  He has tried to cut out potato chips and other unhealthy items however he also notes that he has been unable to exercise regularly due to the coronavirus.  More recently his sugars have gotten under better control with a.m. sugars in the 120-170 range.  Assessment uncontrolled type 2 diabetes with diabetic retinopathy, diabetic peripheral neuropathy, diabetic peripheral arterial disease  Plan Continue Jardiance 10 mg daily-plan to keep this at low-dose to avoid side effects Continue metformin 1000 mg twice daily Continue Trulicity 1.5 mg weekly Increase Actos to 30 mg daily Continue Lyrica 100 mg 3 times daily  Bursitis of right shoulder HPI: He reports he is very bothered by his right shoulder pain.  We discussed this about a year ago he does not think he ever tried the Voltaren gel.  More recently he reports that it is been waking him up at night if he rolls onto his right shoulder.  He is having trouble reaching things that are elevated due to pain in the shoulder.  Assessment subacromial bursitis of the right shoulder with impingement syndrome  Plan discussed he may try Voltaren however I think due to the amount of pain he is and he would probably benefit most  from a steroid injection.  He was agreeable to this I did discuss that his sugars may run a little higher for a few days and he will contact me if sugars become uncontrolled.  Osteoarthritis of left knee HPI: He is also had increased pain in his left knee he reports that it hurts when he is walking on it he also feels that its more swollen.  He denies any trauma to the area.  He has taken some occasional over-the-counter ibuprofen.  Assessment osteoarthritis of the left knee with small joint effusion   Plan trial of Voltaren gel and Rice therapy.  If not improved he certainly could consider a steroid injection he will let me know.  Asthma HPI: He thinks that his asthma is starting to act up a little bit more he has been adherent to [facility] and Singulair.  He reports to me that he is run out of his albuterol inhaler and is requesting a refill.  He continues to abstain from any smoking.  Assessment asthma moderate persistent  Plan Continue current medications provided refill of albuterol rescue inhaler   Medications Ordered Meds ordered this encounter  Medications  . albuterol (PROAIR HFA) 108 (90 Base) MCG/ACT inhaler    Sig: Inhale 2 puffs into the lungs every 4 (four) hours as needed for wheezing or shortness of breath.    Dispense:  18 g    Refill:  5  . empagliflozin (JARDIANCE) 10 MG TABS tablet    Sig: Take 10 mg by mouth daily.    Dispense:  90 tablet    Refill:  3  . diclofenac sodium (VOLTAREN) 1 % GEL    Sig: Apply 4 g topically 4 (four) times daily as needed.    Dispense:  100 g    Refill:  5   Other Orders Orders Placed This Encounter  Procedures  . Glucose, capillary  . POC Hbg A1C   Follow Up: No follow-ups on file.  PROCEDURE NOTE  PROCEDURE: right shoulder joint steroid injection.  PREOPERATIVE DIAGNOSIS: Bursitis of the right shoulder.  POSTOPERATIVE DIAGNOSIS: Bursitis of the right shoulder.  PROCEDURE: The patient was apprised of the risks and the benefits of the procedure and informed consent was obtained, as witnessed by lela. Time-out procedure was performed, with confirmation of the patient's name, date of birth, and correct identification of the right shoulder to be injected. The patient's shoulder was then marked at the appropriate site for injection placement. The shoulder was sterilely prepped with Betadine. A 40 mg (1 milliliter) solution of Kenalog was drawn up into a 3 mL syringe with a 2 mL of 1% lidocaine. The patient was injected with a 25  gauge needle at the lateral  aspect of his  right shoulder. There were no complications. The patient tolerated the procedure well. There was minimal bleeding. The patient was instructed to ice her shoulder upon leaving clinic and refrain from overuse over the next 3 days. The patient was instructed to go to the emergency room with any usual pain, swelling, or redness occurred in the injected area. The patient was given a followup appointment to evaluate response to the injection to his increased range of motion and reduction of pain.

## 2019-03-26 ENCOUNTER — Ambulatory Visit: Payer: Medicare Other | Admitting: Physical Therapy

## 2019-03-26 ENCOUNTER — Other Ambulatory Visit: Payer: Self-pay

## 2019-03-26 ENCOUNTER — Encounter: Payer: Self-pay | Admitting: Internal Medicine

## 2019-03-26 ENCOUNTER — Ambulatory Visit (INDEPENDENT_AMBULATORY_CARE_PROVIDER_SITE_OTHER): Payer: Medicare Other | Admitting: Internal Medicine

## 2019-03-26 VITALS — BP 121/69 | HR 88 | Temp 98.5°F | Resp 20 | Ht 75.0 in | Wt 246.5 lb

## 2019-03-26 DIAGNOSIS — M5442 Lumbago with sciatica, left side: Secondary | ICD-10-CM | POA: Diagnosis not present

## 2019-03-26 DIAGNOSIS — E11319 Type 2 diabetes mellitus with unspecified diabetic retinopathy without macular edema: Secondary | ICD-10-CM | POA: Diagnosis not present

## 2019-03-26 DIAGNOSIS — M7551 Bursitis of right shoulder: Secondary | ICD-10-CM

## 2019-03-26 DIAGNOSIS — E1139 Type 2 diabetes mellitus with other diabetic ophthalmic complication: Secondary | ICD-10-CM

## 2019-03-26 DIAGNOSIS — I10 Essential (primary) hypertension: Secondary | ICD-10-CM

## 2019-03-26 DIAGNOSIS — Z7951 Long term (current) use of inhaled steroids: Secondary | ICD-10-CM

## 2019-03-26 DIAGNOSIS — IMO0002 Reserved for concepts with insufficient information to code with codable children: Secondary | ICD-10-CM

## 2019-03-26 DIAGNOSIS — E1142 Type 2 diabetes mellitus with diabetic polyneuropathy: Secondary | ICD-10-CM

## 2019-03-26 DIAGNOSIS — J4540 Moderate persistent asthma, uncomplicated: Secondary | ICD-10-CM

## 2019-03-26 DIAGNOSIS — E1165 Type 2 diabetes mellitus with hyperglycemia: Secondary | ICD-10-CM | POA: Diagnosis not present

## 2019-03-26 DIAGNOSIS — E1151 Type 2 diabetes mellitus with diabetic peripheral angiopathy without gangrene: Secondary | ICD-10-CM | POA: Diagnosis not present

## 2019-03-26 DIAGNOSIS — M25462 Effusion, left knee: Secondary | ICD-10-CM

## 2019-03-26 DIAGNOSIS — M7541 Impingement syndrome of right shoulder: Secondary | ICD-10-CM

## 2019-03-26 DIAGNOSIS — M1712 Unilateral primary osteoarthritis, left knee: Secondary | ICD-10-CM

## 2019-03-26 DIAGNOSIS — Z7984 Long term (current) use of oral hypoglycemic drugs: Secondary | ICD-10-CM

## 2019-03-26 DIAGNOSIS — Z79899 Other long term (current) drug therapy: Secondary | ICD-10-CM

## 2019-03-26 DIAGNOSIS — M6281 Muscle weakness (generalized): Secondary | ICD-10-CM | POA: Diagnosis not present

## 2019-03-26 LAB — POCT GLYCOSYLATED HEMOGLOBIN (HGB A1C): Hemoglobin A1C: 8.2 % — AB (ref 4.0–5.6)

## 2019-03-26 LAB — GLUCOSE, CAPILLARY: Glucose-Capillary: 178 mg/dL — ABNORMAL HIGH (ref 70–99)

## 2019-03-26 MED ORDER — JARDIANCE 10 MG PO TABS: 10.0000 mg | ORAL_TABLET | Freq: Every day | ORAL | 3 refills | Status: DC

## 2019-03-26 MED ORDER — ALBUTEROL SULFATE HFA 108 (90 BASE) MCG/ACT IN AERS: 2.0000 | INHALATION_SPRAY | RESPIRATORY_TRACT | 5 refills | Status: DC | PRN

## 2019-03-26 MED ORDER — DICLOFENAC SODIUM 1 % TD GEL: 4.0000 g | Freq: Four times a day (QID) | TRANSDERMAL | 5 refills | Status: DC | PRN

## 2019-03-26 NOTE — Assessment & Plan Note (Signed)
HPI: He reports he is very bothered by his right shoulder pain.  We discussed this about a year ago he does not think he ever tried the Voltaren gel.  More recently he reports that it is been waking him up at night if he rolls onto his right shoulder.  He is having trouble reaching things that are elevated due to pain in the shoulder.  Assessment subacromial bursitis of the right shoulder with impingement syndrome  Plan discussed he may try Voltaren however I think due to the amount of pain he is and he would probably benefit most from a steroid injection.  He was agreeable to this I did discuss that his sugars may run a little higher for a few days and he will contact me if sugars become uncontrolled.

## 2019-03-26 NOTE — Assessment & Plan Note (Signed)
HPI: He thinks that his asthma is starting to act up a little bit more he has been adherent to [facility] and Singulair.  He reports to me that he is run out of his albuterol inhaler and is requesting a refill.  He continues to abstain from any smoking.  Assessment asthma moderate persistent  Plan Continue current medications provided refill of albuterol rescue inhaler
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
## 2019-03-26 NOTE — Assessment & Plan Note (Signed)
HPI: He is also had increased pain in his left knee he reports that it hurts when he is walking on it he also feels that its more swollen.  He denies any trauma to the area.  He has taken some occasional over-the-counter ibuprofen.  Assessment osteoarthritis of the left knee with small joint effusion  Plan trial of Voltaren gel and Rice therapy.  If not improved he certainly could consider a steroid injection he will let me know.

## 2019-03-26 NOTE — Assessment & Plan Note (Signed)
HPI: No current issues.  Assessment essential hypertension  Plan Continue Jardiance 10 mg daily

## 2019-03-26 NOTE — Assessment & Plan Note (Signed)
HPI: He denies any polyuria or polydipsia has not had any significant hypoglycemia episodes.  He did recently visit his sister and [place] he notes that he ran out of some of the medications his sugars went higher up to 300 range during that time.  He has tried to cut out potato chips and other unhealthy items however he also notes that he has been unable to exercise regularly due to the coronavirus.  More recently his sugars have gotten under better control with a.m. sugars in the 120-170 range.  Assessment uncontrolled type 2 diabetes with diabetic retinopathy, diabetic peripheral neuropathy, diabetic peripheral arterial disease  Plan Continue Jardiance 10 mg daily-plan to keep this at low-dose to avoid side effects Continue metformin 1000 mg twice daily Continue Trulicity 1.5 mg weekly Increase Actos to 30 mg daily Continue Lyrica 100 mg 3 times daily

## 2019-03-31 ENCOUNTER — Encounter: Payer: Self-pay | Admitting: Physical Therapy

## 2019-03-31 ENCOUNTER — Other Ambulatory Visit: Payer: Self-pay

## 2019-03-31 ENCOUNTER — Ambulatory Visit: Payer: Medicare Other | Admitting: Physical Therapy

## 2019-03-31 DIAGNOSIS — M6281 Muscle weakness (generalized): Secondary | ICD-10-CM | POA: Diagnosis not present

## 2019-03-31 DIAGNOSIS — M5442 Lumbago with sciatica, left side: Secondary | ICD-10-CM | POA: Diagnosis not present

## 2019-03-31 NOTE — Addendum Note (Signed)
Addended by: Raeford Razor L on: 03/31/2019 12:50 PM   Modules accepted: Orders
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
## 2019-04-07 ENCOUNTER — Ambulatory Visit: Payer: Medicare Other | Admitting: Physical Therapy

## 2019-04-09 ENCOUNTER — Ambulatory Visit: Payer: Medicare Other | Attending: Internal Medicine | Admitting: Physical Therapy

## 2019-04-09 ENCOUNTER — Encounter: Payer: Self-pay | Admitting: Physical Therapy

## 2019-04-09 ENCOUNTER — Other Ambulatory Visit: Payer: Self-pay

## 2019-04-09 DIAGNOSIS — M5442 Lumbago with sciatica, left side: Secondary | ICD-10-CM | POA: Diagnosis not present

## 2019-04-09 DIAGNOSIS — M6281 Muscle weakness (generalized): Secondary | ICD-10-CM | POA: Diagnosis not present
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
## 2019-04-14 ENCOUNTER — Other Ambulatory Visit: Payer: Self-pay

## 2019-04-14 ENCOUNTER — Ambulatory Visit: Payer: Medicare Other | Admitting: Physical Therapy

## 2019-04-14 ENCOUNTER — Encounter: Payer: Self-pay | Admitting: Physical Therapy

## 2019-04-14 DIAGNOSIS — M5442 Lumbago with sciatica, left side: Secondary | ICD-10-CM | POA: Diagnosis not present

## 2019-04-14 DIAGNOSIS — M6281 Muscle weakness (generalized): Secondary | ICD-10-CM | POA: Diagnosis not present
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
## 2019-04-16 ENCOUNTER — Ambulatory Visit: Payer: Medicare Other | Admitting: Physical Therapy

## 2019-04-21 ENCOUNTER — Ambulatory Visit: Payer: Medicare Other | Admitting: Physical Therapy

## 2019-04-22 ENCOUNTER — Ambulatory Visit: Payer: Medicare Other | Admitting: Physical Therapy

## 2019-04-22 ENCOUNTER — Other Ambulatory Visit: Payer: Self-pay

## 2019-04-22 DIAGNOSIS — M5442 Lumbago with sciatica, left side: Secondary | ICD-10-CM | POA: Diagnosis not present

## 2019-04-22 DIAGNOSIS — M6281 Muscle weakness (generalized): Secondary | ICD-10-CM

## 2019-04-23 ENCOUNTER — Encounter: Payer: Self-pay | Admitting: Physical Therapy
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
## 2019-04-24 DIAGNOSIS — H40001 Preglaucoma, unspecified, right eye: Secondary | ICD-10-CM | POA: Diagnosis not present

## 2019-04-27 ENCOUNTER — Other Ambulatory Visit: Payer: Self-pay | Admitting: *Deleted

## 2019-04-27 DIAGNOSIS — J4550 Severe persistent asthma, uncomplicated: Secondary | ICD-10-CM

## 2019-04-28 ENCOUNTER — Other Ambulatory Visit: Payer: Self-pay

## 2019-04-28 ENCOUNTER — Ambulatory Visit: Payer: Medicare Other | Admitting: Physical Therapy

## 2019-04-28 ENCOUNTER — Other Ambulatory Visit: Payer: Self-pay | Admitting: *Deleted

## 2019-04-28 ENCOUNTER — Encounter: Payer: Self-pay | Admitting: Physical Therapy

## 2019-04-28 DIAGNOSIS — M5442 Lumbago with sciatica, left side: Secondary | ICD-10-CM | POA: Diagnosis not present

## 2019-04-28 DIAGNOSIS — M6281 Muscle weakness (generalized): Secondary | ICD-10-CM | POA: Diagnosis not present
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
## 2019-04-29 MED ORDER — ATORVASTATIN CALCIUM 40 MG PO TABS: 40.0000 mg | ORAL_TABLET | Freq: Every day | ORAL | 0 refills | Status: DC

## 2019-04-30 ENCOUNTER — Ambulatory Visit: Payer: Medicare Other | Admitting: Physical Therapy

## 2019-04-30 ENCOUNTER — Other Ambulatory Visit: Payer: Self-pay

## 2019-05-05 ENCOUNTER — Telehealth: Payer: Self-pay | Admitting: Dietician

## 2019-05-13 ENCOUNTER — Ambulatory Visit: Payer: Medicare Other | Admitting: Physical Therapy

## 2019-05-15 ENCOUNTER — Encounter: Payer: Self-pay | Admitting: Internal Medicine

## 2019-05-15 ENCOUNTER — Ambulatory Visit: Payer: Medicare Other | Admitting: Physical Therapy

## 2019-05-15 ENCOUNTER — Encounter: Payer: Medicare Other | Admitting: Physical Therapy

## 2019-05-20 ENCOUNTER — Ambulatory Visit: Payer: Medicare Other | Attending: Internal Medicine | Admitting: Physical Therapy

## 2019-05-20 ENCOUNTER — Encounter: Payer: Self-pay | Admitting: Podiatry

## 2019-05-20 ENCOUNTER — Encounter: Payer: Self-pay | Admitting: Physical Therapy

## 2019-05-20 ENCOUNTER — Other Ambulatory Visit: Payer: Self-pay

## 2019-05-20 ENCOUNTER — Ambulatory Visit (INDEPENDENT_AMBULATORY_CARE_PROVIDER_SITE_OTHER): Payer: Medicare Other | Admitting: Podiatry

## 2019-05-20 DIAGNOSIS — M5442 Lumbago with sciatica, left side: Secondary | ICD-10-CM | POA: Diagnosis not present

## 2019-05-20 DIAGNOSIS — M79674 Pain in right toe(s): Secondary | ICD-10-CM

## 2019-05-20 DIAGNOSIS — L84 Corns and callosities: Secondary | ICD-10-CM

## 2019-05-20 DIAGNOSIS — B351 Tinea unguium: Secondary | ICD-10-CM

## 2019-05-20 DIAGNOSIS — E1142 Type 2 diabetes mellitus with diabetic polyneuropathy: Secondary | ICD-10-CM | POA: Diagnosis not present

## 2019-05-20 DIAGNOSIS — M6281 Muscle weakness (generalized): Secondary | ICD-10-CM | POA: Insufficient documentation

## 2019-05-20 DIAGNOSIS — M79675 Pain in left toe(s): Secondary | ICD-10-CM | POA: Diagnosis not present
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
## 2019-05-20 NOTE — Progress Notes (Addendum)
Complaint:  Visit Type: Patient returns to my office for continued preventative foot care services. Complaint: Patient states" my nails have grown long and thick and become painful to walk and wear shoes" Patient has been diagnosed with DM with no foot complications. The patient presents for preventative foot care services. No changes to ROS  Podiatric Exam: Vascular: dorsalis pedis and posterior tibial pulses are palpable bilateral. Capillary return is immediate. Temperature gradient is WNL. Skin turgor WNL  Sensorium: Absent Semmes Weinstein monofilament test. Normal tactile sensation bilaterally. Nail Exam: Pt has thick disfigured discolored nails with subungual debris noted bilateral entire nail hallux through fifth toenails Ulcer Exam: There is no evidence of ulcer or pre-ulcerative changes or infection. Orthopedic Exam: Muscle tone and strength are WNL. No limitations in general ROM. No crepitus or effusions noted. Hammer toes  B/L.   Bony prominences are unremarkable. Skin: No Porokeratosis. No infection or ulcers  Diagnosis:  Onychomycosis, , Pain in right toe, pain in left toes  Treatment & Plan Procedures and Treatment: Consent by patient was obtained for treatment procedures.   Debridement of mycotic and hypertrophic toenails, 1 through 5 bilateral and clearing of subungual debris. No ulceration, no infection noted.  Return Visit-Office Procedure: Patient instructed to return to the office for a follow up visit 3 months for continued evaluation and treatment.  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 And hammer toes  B/:L.   To make an appointment with pedorthist.    Gardiner Barefoot DPM

## 2019-05-21 NOTE — Telephone Encounter (Signed)
[name] scheduled an appointment for Monday, 05/25/19. He'd like to have Continuous glucose monitoring.

## 2019-05-22 ENCOUNTER — Ambulatory Visit: Payer: Medicare Other | Admitting: Physical Therapy

## 2019-05-22 NOTE — Telephone Encounter (Signed)
CGM is fine to complete with mr [facility]

## 2019-05-25 ENCOUNTER — Ambulatory Visit: Payer: Medicare Other | Admitting: Dietician

## 2019-05-25 ENCOUNTER — Other Ambulatory Visit: Payer: Self-pay | Admitting: Dietician

## 2019-05-25 ENCOUNTER — Ambulatory Visit (INDEPENDENT_AMBULATORY_CARE_PROVIDER_SITE_OTHER): Payer: Medicare Other | Admitting: Dietician

## 2019-05-25 DIAGNOSIS — E1139 Type 2 diabetes mellitus with other diabetic ophthalmic complication: Secondary | ICD-10-CM | POA: Diagnosis not present

## 2019-05-25 DIAGNOSIS — IMO0002 Reserved for concepts with insufficient information to code with codable children: Secondary | ICD-10-CM

## 2019-05-25 DIAGNOSIS — E118 Type 2 diabetes mellitus with unspecified complications: Secondary | ICD-10-CM

## 2019-05-25 LAB — GLUCOSE, CAPILLARY: Glucose-Capillary: 213 mg/dL — ABNORMAL HIGH (ref 70–99)

## 2019-05-25 NOTE — Patient Instructions (Signed)
Please record the time, amount and what food drinks and activities you have while wearing the continuous glucose monitor (CGM).  Bring the folder with you to follow up appointments. If your monitor falls off, please place it in the bag provided in your folder and bring it back with you to your next appointment.   Do not have a CT or an MRI while wearing the CGM.   1 week visit has been set up with me and a doctor for the first of two CGM downloads.   You will also return in 2 weeks to have your second download and the CGM removed.  Debera Lat, RD 05/25/2019 11:29 AM

## 2019-05-25 NOTE — Progress Notes (Signed)
Referral for Continuous glucose monitoring per Dr. Heber [place].

## 2019-05-25 NOTE — Progress Notes (Signed)
Documentation for Freestyle Libre Pro Continuous glucose monitoring Freestyle Libre Pro CGM sensor placed today. Patient was educated about wearing sensor, keeping food, activity and medication log and when to call office. Patient was educated about how to care for the sensor and not to have an MRI, CT or Diathermy while wearing the sensor. Follow up was arranged with the patient for 1 week.   Lot #: [id number] Serial #:[id number] Expiration Date: 08/08/2019  CBG 213 4 hours after coffee about 730 AM today.He reports taking all his diabetes medicine as prescribed. He also takes a multivitamin daily.   Lab Results  Component Value Date   HGBA1C 8.2 (A) 03/26/2019   HGBA1C 8.1 (A) 12/04/2018   HGBA1C 9.2 (A) 08/21/2018   HGBA1C 9.0 (A) 05/22/2018   HGBA1C 7.8 01/09/2018    Wt Readings from Last 5 Encounters:  05/25/19 240 lb (108.9 kg)  03/26/19 246 lb 8 oz (111.8 kg)  01/22/19 245 lb 3.2 oz (111.2 kg)  12/24/18 245 lb (111.1 kg)  12/04/18 244 lb 9.6 oz (110.9 kg)   Follow up in 1 week and again in 2 weeks.   Debera Lat, RD 05/25/2019 11:52 AM.

## 2019-05-27 ENCOUNTER — Other Ambulatory Visit: Payer: Medicare Other | Admitting: Orthotics

## 2019-06-02 ENCOUNTER — Encounter: Payer: Self-pay | Admitting: Dietician

## 2019-06-02 ENCOUNTER — Other Ambulatory Visit: Payer: Self-pay

## 2019-06-02 ENCOUNTER — Ambulatory Visit: Payer: Medicare Other | Admitting: Dietician

## 2019-06-02 NOTE — Progress Notes (Signed)
Documentation for Freestyle Libre Pro Continuous glucose monitoring:  appointment start time: [id number] end time: 1515  Richard [name] presents today for Freestyle Libre Pro CGM sensor download. The results are as following:   CGM Results from Download #1  Average is  153  for 8 days Glucose management indicator 7 % Time in range (70-180 mg/dL): 81 % (Goal >70%) Time High (181-250 mg/dL) 16 % (Goal < 25%) Time Very High (>250 mg/dl) 3 % (Goal < 5%) Time Low (54-69 mg/dL): 0 % (Goal is <4%) Time Very Low (<54) 0%  (Goal <1%)  Coefficient of variation:25.5% (Goal is <36%)   Richard [name] identified that the highest peak in his blood sugar on the CGM report is in the  morinng after his coffee that has cream and sugar and watermelon 2-3 or more portions.  He is contemplating change to lower this rise. Suggestion of alternative foods were made such as the day he had a bowl of cereal and coffee and his blood sugars were well controlled afterwards.   Follow up in 1 week for a final download and remove the sensor.   Debera Lat, RD 06/02/2019 3:35 PM.

## 2019-06-04 ENCOUNTER — Ambulatory Visit: Payer: Medicare Other | Admitting: Orthotics

## 2019-06-04 ENCOUNTER — Other Ambulatory Visit: Payer: Self-pay

## 2019-06-04 DIAGNOSIS — M79674 Pain in right toe(s): Secondary | ICD-10-CM

## 2019-06-04 DIAGNOSIS — L84 Corns and callosities: Secondary | ICD-10-CM

## 2019-06-04 DIAGNOSIS — B351 Tinea unguium: Secondary | ICD-10-CM

## 2019-06-04 DIAGNOSIS — L97411 Non-pressure chronic ulcer of right heel and midfoot limited to breakdown of skin: Secondary | ICD-10-CM

## 2019-06-04 DIAGNOSIS — E08621 Diabetes mellitus due to underlying condition with foot ulcer: Secondary | ICD-10-CM

## 2019-06-04 DIAGNOSIS — E1142 Type 2 diabetes mellitus with diabetic polyneuropathy: Secondary | ICD-10-CM

## 2019-06-04 DIAGNOSIS — L97511 Non-pressure chronic ulcer of other part of right foot limited to breakdown of skin: Secondary | ICD-10-CM

## 2019-06-04 NOTE — Progress Notes (Signed)

## 2019-06-08 ENCOUNTER — Other Ambulatory Visit: Payer: Self-pay

## 2019-06-08 ENCOUNTER — Encounter: Payer: Self-pay | Admitting: Dietician

## 2019-06-08 ENCOUNTER — Ambulatory Visit (INDEPENDENT_AMBULATORY_CARE_PROVIDER_SITE_OTHER): Payer: Medicare Other | Admitting: Dietician

## 2019-06-08 DIAGNOSIS — Z713 Dietary counseling and surveillance: Secondary | ICD-10-CM | POA: Diagnosis not present

## 2019-06-08 DIAGNOSIS — E1139 Type 2 diabetes mellitus with other diabetic ophthalmic complication: Secondary | ICD-10-CM | POA: Diagnosis not present

## 2019-06-08 DIAGNOSIS — IMO0002 Reserved for concepts with insufficient information to code with codable children: Secondary | ICD-10-CM

## 2019-06-08 NOTE — Progress Notes (Signed)
CGM Results from Download #2. His sensor was removed. He has no questions or concerns today, he is happy with his results. He says he has been trying to do what he knows helps him control his blood sugar. He asks the report be shared with Dr. Heber [place].  Average is  153  for 14 days Glucose management indicator 7.0 % Time in range (70-180 mg/dL): 79 % (Goal >70%) Time High (181-250 mg/dL) 18 % (Goal < 25%) Time Very High (>250 mg/dl) 3 % (Goal < 5%) Time Low (54-69 mg/dL): 0 % (Goal is <4%) Time Very Low (<54) 0%  (Goal <1%)  Coefficient of variation:26.5% (Goal is <36%) Follow up in 3 months per patient for CGM if okay with his doctor.   Debera Lat, RD 06/08/2019 2:50 PM.

## 2019-06-18 ENCOUNTER — Telehealth: Payer: Self-pay | Admitting: Podiatry

## 2019-06-18 NOTE — Telephone Encounter (Signed)
Pt left message checking on diabetic shoes.  I returned call and upon looking we received part of the paperwork signed but we are missing one page. I have resent the one page over with a note attached.

## 2019-06-26 ENCOUNTER — Encounter: Payer: Self-pay | Admitting: *Deleted

## 2019-07-09 ENCOUNTER — Ambulatory Visit: Payer: Medicare Other | Admitting: Internal Medicine

## 2019-07-15 ENCOUNTER — Other Ambulatory Visit: Payer: Self-pay | Admitting: *Deleted

## 2019-07-15 MED ORDER — ALBUTEROL SULFATE HFA 108 (90 BASE) MCG/ACT IN AERS: 2.0000 | INHALATION_SPRAY | RESPIRATORY_TRACT | 5 refills | Status: DC | PRN

## 2019-07-15 MED ORDER — PREGABALIN 100 MG PO CAPS: 100.0000 mg | ORAL_CAPSULE | Freq: Three times a day (TID) | ORAL | 0 refills | Status: DC

## 2019-07-15 NOTE — Telephone Encounter (Signed)
Pt was called / informed. 

## 2019-07-17 ENCOUNTER — Other Ambulatory Visit: Payer: Self-pay | Admitting: *Deleted

## 2019-07-17 MED ORDER — DULERA 200-5 MCG/ACT IN AERO: 2.0000 | INHALATION_SPRAY | Freq: Two times a day (BID) | RESPIRATORY_TRACT | 3 refills | Status: DC

## 2019-07-27 ENCOUNTER — Other Ambulatory Visit: Payer: Self-pay | Admitting: *Deleted

## 2019-07-28 MED ORDER — METFORMIN HCL 1000 MG PO TABS: 1000.0000 mg | ORAL_TABLET | Freq: Two times a day (BID) | ORAL | 1 refills | Status: DC

## 2019-08-06 ENCOUNTER — Ambulatory Visit (INDEPENDENT_AMBULATORY_CARE_PROVIDER_SITE_OTHER): Payer: Medicare Other | Admitting: Internal Medicine

## 2019-08-06 ENCOUNTER — Other Ambulatory Visit: Payer: Self-pay

## 2019-08-06 ENCOUNTER — Encounter: Payer: Self-pay | Admitting: Internal Medicine

## 2019-08-06 VITALS — BP 129/71 | HR 82 | Temp 98.1°F | Ht 74.0 in | Wt 236.5 lb

## 2019-08-06 DIAGNOSIS — E1165 Type 2 diabetes mellitus with hyperglycemia: Secondary | ICD-10-CM

## 2019-08-06 DIAGNOSIS — M7551 Bursitis of right shoulder: Secondary | ICD-10-CM | POA: Diagnosis not present

## 2019-08-06 DIAGNOSIS — E1139 Type 2 diabetes mellitus with other diabetic ophthalmic complication: Secondary | ICD-10-CM

## 2019-08-06 DIAGNOSIS — IMO0002 Reserved for concepts with insufficient information to code with codable children: Secondary | ICD-10-CM

## 2019-08-06 DIAGNOSIS — Z23 Encounter for immunization: Secondary | ICD-10-CM | POA: Diagnosis not present

## 2019-08-06 DIAGNOSIS — I10 Essential (primary) hypertension: Secondary | ICD-10-CM | POA: Diagnosis not present

## 2019-08-06 DIAGNOSIS — E11319 Type 2 diabetes mellitus with unspecified diabetic retinopathy without macular edema: Secondary | ICD-10-CM

## 2019-08-06 DIAGNOSIS — Z794 Long term (current) use of insulin: Secondary | ICD-10-CM

## 2019-08-06 DIAGNOSIS — E1140 Type 2 diabetes mellitus with diabetic neuropathy, unspecified: Secondary | ICD-10-CM

## 2019-08-06 LAB — POCT GLYCOSYLATED HEMOGLOBIN (HGB A1C): Hemoglobin A1C: 7.4 % — AB (ref 4.0–5.6)

## 2019-08-06 LAB — GLUCOSE, CAPILLARY: Glucose-Capillary: 133 mg/dL — ABNORMAL HIGH (ref 70–99)

## 2019-08-06 MED ORDER — TRULICITY 1.5 MG/0.5ML ~~LOC~~ SOAJ: 1.5000 mg | SUBCUTANEOUS | 3 refills | Status: DC

## 2019-08-06 NOTE — Assessment & Plan Note (Signed)
HPI: No current issues.  Assessment essential hypertension  Plan Continue Jardiance 10 mg daily 

## 2019-08-06 NOTE — Progress Notes (Signed)
  Subjective:  HPI: Richard [name] is a 58 y.o. male who presents for f/u HTN and DM  Please see Assessment and Plan below for the status of his chronic medical problems.  Review of Systems: Review of Systems  Constitutional: Negative for fever.  HENT: Negative for hearing loss.   Eyes: Negative for blurred vision.  Respiratory: Negative for cough.   Cardiovascular: Negative for chest pain.  Gastrointestinal: Negative for abdominal pain and diarrhea.  Genitourinary: Negative for dysuria, flank pain and frequency.  Musculoskeletal: Positive for joint pain.  Endo/Heme/Allergies: Negative for polydipsia.    Objective:  Physical Exam: Vitals:   08/06/19 0831  BP: 129/71  Pulse: 82  Temp: 98.1 F (36.7 C)  TempSrc: Oral  SpO2: 100%  Weight: 236 lb 8 oz (107.3 kg)  Height: 6\' 2"  (1.88 m)   Body mass index is 30.36 kg/m. Physical Exam Vitals signs and nursing note reviewed.  Constitutional:      Appearance: Normal appearance.  Cardiovascular:     Rate and Rhythm: Normal rate and regular rhythm.  Pulmonary:     Effort: Pulmonary effort is normal.     Breath sounds: Normal breath sounds.  Abdominal:     General: Abdomen is flat.     Palpations: Abdomen is soft.  Musculoskeletal:     Right shoulder: He exhibits tenderness.     Comments: Positive neers, painful arc from 90-120 abduction  Neurological:     Mental Status: He is alert.    Assessment & Plan:  See Encounters Tab for problem based charting.  Medications Ordered Meds ordered this encounter  Medications  . Dulaglutide (TRULICITY) 1.5 0000000 SOPN    Sig: Inject 1.5 mg into the skin once a week.    Dispense:  12 pen    Refill:  3    Place on file for next refill.   Other Orders Orders Placed This Encounter  Procedures  . Flu Vaccine QUAD 36+ mos IM  . Glucose, capillary  . POCT HgB A1C (CPT [phone])   Follow Up: Return in about 3 months (around 11/06/2019).  PROCEDURE NOTE  PROCEDURE: right  shoulder joint steroid injection.  PREOPERATIVE DIAGNOSIS: Bursitis of the right shoulder.  POSTOPERATIVE DIAGNOSIS: Bursitis of the right shoulder.  PROCEDURE: The patient was apprised of the risks and the benefits of the procedure and informed consent was obtained, as witnessed by [facility]. Time-out procedure was performed, with confirmation of the patient's name, date of birth, and correct identification of the right shoulder to be injected. The patient's shoulder was then marked at the appropriate site for injection placement. The shoulder was sterilely prepped with Betadine. A 40 mg (1 milliliter) solution of Kenalog was drawn up into a 3 mL syringe with a 2 mL of 1% lidocaine. The patient was injected with a 25 gauge needle at the lateral  aspect of his  right shoulder. There were no complications. The patient tolerated the procedure well. There was minimal bleeding. The patient was instructed to ice her shoulder upon leaving clinic and refrain from overuse over the next 3 days. The patient was instructed to go to the emergency room with any usual pain, swelling, or redness occurred in the injected area. The patient was given a followup appointment to evaluate response to the injection to his increased range of motion and reduction of pain.

## 2019-08-06 NOTE — Assessment & Plan Note (Signed)
HPI: He is doing better with his healthy eating he reports he has a new girlfriend that is a vegetarian and that is greatly helped him.  He is adherent to all his medications he denies any side effects.  Assessment uncontrolled type 2 diabetes with diabetic retinopathy, improved but above goal  Plan Continue Metformin 1 g twice daily Continue Actos 30 mg daily Continue Jardiance 10 mg daily Continue Trulicity 1.5 mg weekly

## 2019-08-06 NOTE — Assessment & Plan Note (Signed)
HPI: He still is having some pain in his right shoulder he has been undergoing physical therapy this is helped, and a tape placed by the physical therapist was particularly helpful however he had some irritation of the skin and could not continue that.  Assessment bursitis, impingement of right shoulder  Plan Gust with his improvement in A1c I think it is reasonable to try a steroid injection to see if that will help in addition to physical therapy.  He was agreeable and it was performed today.

## 2019-08-12 ENCOUNTER — Ambulatory Visit: Payer: Self-pay | Admitting: Orthotics

## 2019-08-12 ENCOUNTER — Other Ambulatory Visit: Payer: Self-pay

## 2019-08-12 DIAGNOSIS — M2042 Other hammer toe(s) (acquired), left foot: Secondary | ICD-10-CM

## 2019-08-12 DIAGNOSIS — M2041 Other hammer toe(s) (acquired), right foot: Secondary | ICD-10-CM

## 2019-08-12 DIAGNOSIS — L84 Corns and callosities: Secondary | ICD-10-CM

## 2019-08-12 DIAGNOSIS — E1142 Type 2 diabetes mellitus with diabetic polyneuropathy: Secondary | ICD-10-CM

## 2019-08-21 ENCOUNTER — Other Ambulatory Visit: Payer: Self-pay

## 2019-08-21 MED ORDER — LEVOCETIRIZINE DIHYDROCHLORIDE 5 MG PO TABS: 5.0000 mg | ORAL_TABLET | Freq: Every evening | ORAL | 1 refills | Status: DC

## 2019-08-21 MED ORDER — PREGABALIN 100 MG PO CAPS: 100.0000 mg | ORAL_CAPSULE | Freq: Three times a day (TID) | ORAL | 3 refills | Status: DC

## 2019-08-21 MED ORDER — LEVOCETIRIZINE DIHYDROCHLORIDE 5 MG PO TABS: 5.0000 mg | ORAL_TABLET | Freq: Every evening | ORAL | 0 refills | Status: DC

## 2019-08-21 NOTE — Telephone Encounter (Signed)
Pls contact pt [phone] returning call

## 2019-08-21 NOTE — Telephone Encounter (Signed)
pregabalin (LYRICA) 100 MG capsule, refill request @  [facility], [place] AT [address] [phone] (Phone) [phone] (Fax)   Requesting to speak with a nurse. Please call pt back.

## 2019-08-21 NOTE — Telephone Encounter (Signed)
RTC to pt per pt request, VM obtained, [facility] compliant message left on VM. SChaplin, RN,BSN

## 2019-08-24 ENCOUNTER — Other Ambulatory Visit: Payer: Self-pay | Admitting: *Deleted

## 2019-08-26 ENCOUNTER — Telehealth: Payer: Self-pay

## 2019-08-26 ENCOUNTER — Ambulatory Visit: Payer: Medicare Other | Admitting: Podiatry

## 2019-08-26 NOTE — Telephone Encounter (Signed)
Still interested in attending PREP starting 11/30 but relies on bus transport. No bus stop at [facility]. Agreeable to meet on 11/23 for intake. I will help coordinate transportation for pt.

## 2019-08-31 ENCOUNTER — Ambulatory Visit: Payer: Medicare Other | Admitting: Dietician
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
## 2019-09-14 NOTE — Progress Notes (Unsigned)
[facility] PREP Weekly Session   Patient Details  Name: Richard [name] MRN: [id number] Date of Birth: 05/06/1961 Age: 58 y.o. PCP: [name], DO  There were no vitals filed for this visit.  [facility] Weekly seesion - 09/14/19 1400      Weekly Session   Topic Discussed  Eating for the season;Healthy eating tips    Minutes exercised this week  30 minutes   30 min of cardio, no strength or flexiblity noted      Wt 235.4  Pam Tally Joe 09/14/2019, 2:56 PM

## 2019-09-21 NOTE — Progress Notes (Signed)
[facility] PREP Weekly Session   Patient Details  Name: Richard [name] MRN: [id number] Date of Birth: Feb 10, 1961 Age: 58 y.o. PCP: [name], DO  Vitals:   09/21/19 1445  Weight: 233 lb 9.6 oz (106 kg)    [facility] Weekly seesion - 09/21/19 1400      Weekly Session   Topic Discussed  Importance of resistance training    Minutes exercised this week  65 minutes   45 min cardio, 20 min flexibility     Reviewed BP recordings--all running under 130's/70's  Things you are grateful for : God woke me up Being able to take better care of myself Nutrition celebrations: eating more veggies Barriers/struggles: sweets and potato chips   Barnett Hatter 09/21/2019, 2:46 PM

## 2019-09-28 ENCOUNTER — Other Ambulatory Visit: Payer: Self-pay | Admitting: Internal Medicine

## 2019-09-28 DIAGNOSIS — J4550 Severe persistent asthma, uncomplicated: Secondary | ICD-10-CM

## 2019-09-28 MED ORDER — MONTELUKAST SODIUM 10 MG PO TABS: 10.0000 mg | ORAL_TABLET | Freq: Every day | ORAL | 5 refills | Status: DC

## 2019-09-28 NOTE — Progress Notes (Signed)
[facility] PREP Weekly Session   Patient Details  Name: Richard [name] MRN: [id number] Date of Birth: [birth year] Age: 58 y.o. PCP: [name], DO  Vitals:   09/28/19 1439  BP: 129/75  Pulse: 95  Weight: 237 lb 3.2 oz (107.6 kg)    [facility] Weekly seesion - 09/28/19 1400      Weekly Session   Topic Discussed  Health habits    Minutes exercised this week  190 minutes   2 hr of cardio, 30 min of strength 40 min of flexbility work     Fun things you did since last meeting: walking more Things you are grateful for: waking up and no covid Nutrition celebration: more veggies  Barriers and struggles: carbs   Barnett Hatter 09/28/2019, 2:40 PM

## 2019-09-28 NOTE — Telephone Encounter (Signed)
Needs refill on montelukast (SINGULAIR) 10 MG tablet  ;pt contact [facility], [address] AT [address]

## 2019-10-05 NOTE — Progress Notes (Signed)
[facility] PREP Weekly Session   Patient Details  Name: Richard [name] MRN: [id number] Date of Birth: [birth year] Age: 58 y.o. PCP: [name], DO  Vitals:   10/05/19 1433  Weight: 235 lb (106.6 kg)    [facility] Weekly seesion - 10/05/19 1400      Weekly Session   Topic Discussed  Restaurant Eating   sodium demonstration   Minutes exercised this week  57 minutes   27 min cardio, 10 min strength, 20 min flexibility     Things you are grateful for: coming to the [facility], waking up   Barnett Hatter 10/05/2019, 2:35 PM

## 2019-10-30 ENCOUNTER — Encounter: Payer: Self-pay | Admitting: Podiatry

## 2019-10-30 ENCOUNTER — Ambulatory Visit (INDEPENDENT_AMBULATORY_CARE_PROVIDER_SITE_OTHER): Payer: Medicare Other | Admitting: Podiatry

## 2019-10-30 ENCOUNTER — Other Ambulatory Visit: Payer: Self-pay

## 2019-10-30 DIAGNOSIS — M79675 Pain in left toe(s): Secondary | ICD-10-CM

## 2019-10-30 DIAGNOSIS — B351 Tinea unguium: Secondary | ICD-10-CM

## 2019-10-30 DIAGNOSIS — M79674 Pain in right toe(s): Secondary | ICD-10-CM

## 2019-10-30 DIAGNOSIS — E1142 Type 2 diabetes mellitus with diabetic polyneuropathy: Secondary | ICD-10-CM

## 2019-10-30 DIAGNOSIS — L84 Corns and callosities: Secondary | ICD-10-CM

## 2019-10-30 NOTE — Progress Notes (Signed)
Complaint:  Visit Type: Patient returns to my office for continued preventative foot care services. Complaint: Patient states" my nails have grown long and thick and become painful to walk and wear shoes" Patient has been diagnosed with DM with no foot complications. The patient presents for preventative foot care services. No changes to ROS  Podiatric Exam: Vascular: dorsalis pedis and posterior tibial pulses are palpable bilateral. Capillary return is immediate. Temperature gradient is WNL. Skin turgor WNL  Sensorium: Absent Semmes Weinstein monofilament test. Normal tactile sensation bilaterally. Nail Exam: Pt has thick disfigured discolored nails with subungual debris noted bilateral entire nail hallux through fifth toenails Ulcer Exam: There is no evidence of ulcer or pre-ulcerative changes or infection. Orthopedic Exam: Muscle tone and strength are WNL. No limitations in general ROM. No crepitus or effusions noted. Hammer toes  B/L.   Bony prominences are unremarkable. Skin: No Porokeratosis. No infection or ulcers  Diagnosis:  Onychomycosis, , Pain in right toe, pain in left toes  Treatment & Plan Procedures and Treatment: Consent by patient was obtained for treatment procedures.   Debridement of mycotic and hypertrophic toenails, 1 through 5 bilateral and clearing of subungual debris. No ulceration, no infection noted.  Return Visit-Office Procedure: Patient instructed to return to the office for a follow up visit 3 months for continued evaluation and treatment.     To make an appointment with pedorthist. Needs additional off-weight bearing right forefoot.    Gardiner Barefoot DPM

## 2019-10-30 NOTE — Progress Notes (Signed)
Has not been attending classes due to returning to school which is 11am to [place] membership is in place until 2/28 if he needs help with transport to let me know.  Will try to attend but classes last until spring.  Interested in attending another 12 weeks when available.

## 2019-11-02 ENCOUNTER — Other Ambulatory Visit: Payer: Medicare Other | Admitting: Orthotics

## 2019-11-04 ENCOUNTER — Other Ambulatory Visit: Payer: Medicare Other | Admitting: Orthotics

## 2019-11-05 ENCOUNTER — Other Ambulatory Visit: Payer: Self-pay

## 2019-11-05 ENCOUNTER — Ambulatory Visit: Payer: Medicare Other | Admitting: Orthotics

## 2019-11-05 DIAGNOSIS — L84 Corns and callosities: Secondary | ICD-10-CM

## 2019-11-05 DIAGNOSIS — E08621 Diabetes mellitus due to underlying condition with foot ulcer: Secondary | ICD-10-CM

## 2019-11-05 DIAGNOSIS — E1142 Type 2 diabetes mellitus with diabetic polyneuropathy: Secondary | ICD-10-CM

## 2019-11-05 DIAGNOSIS — B351 Tinea unguium: Secondary | ICD-10-CM

## 2019-11-05 DIAGNOSIS — M79674 Pain in right toe(s): Secondary | ICD-10-CM

## 2019-11-05 NOTE — Progress Notes (Signed)
Adjusted f/o by adding k-wedge under plantarflexed/calluse 2nd met head

## 2019-11-06 ENCOUNTER — Telehealth: Payer: Self-pay | Admitting: Dietician

## 2019-11-06 DIAGNOSIS — IMO0002 Reserved for concepts with insufficient information to code with codable children: Secondary | ICD-10-CM

## 2019-11-06 DIAGNOSIS — E1139 Type 2 diabetes mellitus with other diabetic ophthalmic complication: Secondary | ICD-10-CM

## 2019-11-06 NOTE — Addendum Note (Signed)
Addended by: Resa Miner on: 11/06/2019 02:27 PM   Modules accepted: Orders

## 2019-11-06 NOTE — Telephone Encounter (Signed)
Patient agreed to appointment for CGM per his request same day he sees Dr. Heber [place]. Request referral

## 2019-11-06 NOTE — Addendum Note (Signed)
Addended by: [name] on: 11/06/2019 04:51 PM   Modules accepted: Orders

## 2019-11-19 ENCOUNTER — Encounter: Payer: Self-pay | Admitting: Dietician

## 2019-11-19 ENCOUNTER — Encounter: Payer: Self-pay | Admitting: Internal Medicine

## 2019-11-19 ENCOUNTER — Ambulatory Visit (INDEPENDENT_AMBULATORY_CARE_PROVIDER_SITE_OTHER): Payer: Medicare Other | Admitting: Internal Medicine

## 2019-11-19 ENCOUNTER — Ambulatory Visit: Payer: Medicare Other | Admitting: Dietician

## 2019-11-19 ENCOUNTER — Other Ambulatory Visit: Payer: Self-pay

## 2019-11-19 VITALS — BP 121/73 | HR 96 | Temp 98.6°F | Ht 74.0 in | Wt 237.8 lb

## 2019-11-19 DIAGNOSIS — I10 Essential (primary) hypertension: Secondary | ICD-10-CM | POA: Diagnosis not present

## 2019-11-19 DIAGNOSIS — Z79899 Other long term (current) drug therapy: Secondary | ICD-10-CM

## 2019-11-19 DIAGNOSIS — Z9111 Patient's noncompliance with dietary regimen: Secondary | ICD-10-CM

## 2019-11-19 DIAGNOSIS — E1165 Type 2 diabetes mellitus with hyperglycemia: Secondary | ICD-10-CM

## 2019-11-19 DIAGNOSIS — I700 Atherosclerosis of aorta: Secondary | ICD-10-CM

## 2019-11-19 DIAGNOSIS — J9859 Other diseases of mediastinum, not elsewhere classified: Secondary | ICD-10-CM

## 2019-11-19 DIAGNOSIS — E1139 Type 2 diabetes mellitus with other diabetic ophthalmic complication: Secondary | ICD-10-CM | POA: Diagnosis not present

## 2019-11-19 DIAGNOSIS — E1151 Type 2 diabetes mellitus with diabetic peripheral angiopathy without gangrene: Secondary | ICD-10-CM | POA: Diagnosis not present

## 2019-11-19 DIAGNOSIS — IMO0002 Reserved for concepts with insufficient information to code with codable children: Secondary | ICD-10-CM

## 2019-11-19 DIAGNOSIS — I739 Peripheral vascular disease, unspecified: Secondary | ICD-10-CM

## 2019-11-19 DIAGNOSIS — M7551 Bursitis of right shoulder: Secondary | ICD-10-CM

## 2019-11-19 DIAGNOSIS — Z7982 Long term (current) use of aspirin: Secondary | ICD-10-CM

## 2019-11-19 DIAGNOSIS — Z7984 Long term (current) use of oral hypoglycemic drugs: Secondary | ICD-10-CM

## 2019-11-19 LAB — POCT GLYCOSYLATED HEMOGLOBIN (HGB A1C): Hemoglobin A1C: 8.7 % — AB (ref 4.0–5.6)

## 2019-11-19 LAB — GLUCOSE, CAPILLARY: Glucose-Capillary: 203 mg/dL — ABNORMAL HIGH (ref 70–99)

## 2019-11-19 MED ORDER — ATORVASTATIN CALCIUM 40 MG PO TABS: 40.0000 mg | ORAL_TABLET | Freq: Every day | ORAL | 3 refills | Status: DC

## 2019-11-19 MED ORDER — ALBUTEROL SULFATE HFA 108 (90 BASE) MCG/ACT IN AERS: 2.0000 | INHALATION_SPRAY | RESPIRATORY_TRACT | 5 refills | Status: DC | PRN

## 2019-11-19 MED ORDER — PIOGLITAZONE HCL 30 MG PO TABS: 30.0000 mg | ORAL_TABLET | Freq: Every day | ORAL | 3 refills | Status: DC

## 2019-11-19 MED ORDER — PREGABALIN 100 MG PO CAPS: 100.0000 mg | ORAL_CAPSULE | Freq: Three times a day (TID) | ORAL | 3 refills | Status: DC

## 2019-11-19 NOTE — Assessment & Plan Note (Signed)
He is due for follow-up in March with a repeat CT (1 year) he has not heard from Dr. Thayer Ohm office I discussed that we will readdress at next visit if not obtained I am happy to order the repeat CT.

## 2019-11-19 NOTE — Assessment & Plan Note (Signed)
Chronic and stable denies claudication symptoms no current foot ulcers.  Continue aspirin and atorvastatin.

## 2019-11-19 NOTE — Progress Notes (Signed)
  Subjective:  HPI: [name] is a 59 y.o. male who presents for f/u DM, HTN, right shoulder pain   Please see Assessment and Plan below for the status of his chronic medical problems.  Review of Systems: Review of Systems  Constitutional: Negative for fever.  Respiratory: Negative for cough.   Cardiovascular: Negative for chest pain and leg swelling.  Genitourinary: Negative for dysuria and frequency.  Musculoskeletal: Positive for joint pain. Negative for falls.  Neurological: Negative for dizziness.  Endo/Heme/Allergies: Negative for polydipsia.  Psychiatric/Behavioral: Negative for depression.    Objective:  Physical Exam: Vitals:   11/19/19 0816  BP: 121/73  Pulse: 96  Temp: 98.6 F (37 C)  TempSrc: Oral  SpO2: 99%  Weight: 237 lb 12.8 oz (107.9 kg)  Height: 6\' 2"  (1.88 m)   Body mass index is 30.53 kg/m. Physical Exam Vitals and nursing note reviewed.  Constitutional:      Appearance: Normal appearance.  Cardiovascular:     Rate and Rhythm: Normal rate and regular rhythm.  Pulmonary:     Effort: Pulmonary effort is normal.     Breath sounds: Normal breath sounds.  Musculoskeletal:     Right shoulder: Tenderness present. No effusion. Decreased range of motion.     Left shoulder: No tenderness. Normal range of motion.     Right lower leg: No edema.     Left lower leg: No edema.     Comments: Right shoulder, neers positive, painful arc, decreased aROM extension, abduction  Neurological:     Mental Status: He is alert.    Assessment & Plan:  See Encounters Tab for problem based charting.  Medications Ordered Meds ordered this encounter  Medications  . pregabalin (LYRICA) 100 MG capsule    Sig: Take 1 capsule (100 mg total) by mouth 3 (three) times daily.    Dispense:  90 capsule    Refill:  3  . albuterol (PROAIR HFA) 108 (90 Base) MCG/ACT inhaler    Sig: Inhale 2 puffs into the lungs every 4 (four) hours as needed for wheezing or shortness of  breath.    Dispense:  18 g    Refill:  5  . atorvastatin (LIPITOR) 40 MG tablet    Sig: Take 1 tablet (40 mg total) by mouth daily.    Dispense:  90 tablet    Refill:  3  . pioglitazone (ACTOS) 30 MG tablet    Sig: Take 1 tablet (30 mg total) by mouth daily.    Dispense:  90 tablet    Refill:  3    Place on file.   Other Orders Orders Placed This Encounter  Procedures  . Ambulatory referral to Orthopedic Surgery    Referral Priority:   Routine    Referral Type:   Surgical    Referral Reason:   Specialty Services Required    Requested Specialty:   Orthopedic Surgery    Number of Visits Requested:   1  . POC Hbg A1C   Follow Up: Return in about 3 months (around 02/16/2020).

## 2019-11-19 NOTE — Progress Notes (Signed)
Patient left the office without being seen. Debera Lat, RD 11/19/2019 11:42 AM.

## 2019-11-19 NOTE — Assessment & Plan Note (Signed)
HPI: He reports that his sugars have been running higher lately he attributes this to his diet he endorses that he has been eating more junk food and did not adhere to his diabetic diet during the recent holidays and Super Bowl.  He denies any hypoglycemia.  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  He would like to continue to avoid initiation of insulin and will try diet changes. We will continue metformin 1 g twice daily Jardiance 10 mg daily Trulicity 1.5 g weekly Actos 30 mg daily

## 2019-11-19 NOTE — Assessment & Plan Note (Signed)
HPI: Richard [name] returns today complaining of right shoulder pain.  He would notes that it is worse at night laying on his right side.  It is also difficult to grab objects above his head.  The last 2 visits we have done steroid injections to his right shoulder for suspected subacromial bursitis.  He reports both times he is received at least 2 months of relief typically with 100% relief after about a day or 2 after the injection with return of full range of motion.  He cannot think of any specific activities that he is doing to cause recurrent injury he has not had no trauma to the area.  Assessment bursitis of right shoulder  Plan He has continued to get relief from steroid injections however given that he is in recurrent need I think it would be a good idea to have him evaluated by orthopedic surgery he is willing to go for consultation.

## 2019-11-19 NOTE — Assessment & Plan Note (Signed)
Stable  continue atorvastatin 

## 2019-11-20 ENCOUNTER — Telehealth: Payer: Self-pay | Admitting: Dietician

## 2019-11-20 NOTE — Telephone Encounter (Signed)
CGM appointment rescheduled to Friday 11/27/19

## 2019-11-23 NOTE — Telephone Encounter (Signed)
Appointment has now been attached to the Referral.

## 2019-11-27 ENCOUNTER — Ambulatory Visit: Payer: Medicare Other | Admitting: Orthopaedic Surgery

## 2019-11-27 ENCOUNTER — Telehealth: Payer: Self-pay | Admitting: Dietician

## 2019-11-27 ENCOUNTER — Ambulatory Visit: Payer: Medicare Other | Admitting: Dietician

## 2019-11-27 ENCOUNTER — Encounter: Payer: Medicare Other | Admitting: Dietician

## 2019-11-27 NOTE — Telephone Encounter (Signed)
He rescheduled his appointment for Continuous glucose monitoring.

## 2019-12-01 ENCOUNTER — Other Ambulatory Visit: Payer: Self-pay

## 2019-12-01 ENCOUNTER — Ambulatory Visit (INDEPENDENT_AMBULATORY_CARE_PROVIDER_SITE_OTHER): Payer: Medicare Other | Admitting: Orthopaedic Surgery

## 2019-12-01 ENCOUNTER — Encounter: Payer: Self-pay | Admitting: Orthopaedic Surgery

## 2019-12-01 ENCOUNTER — Ambulatory Visit: Payer: Self-pay

## 2019-12-01 VITALS — Ht 74.0 in | Wt 234.0 lb

## 2019-12-01 DIAGNOSIS — M25511 Pain in right shoulder: Secondary | ICD-10-CM | POA: Diagnosis not present

## 2019-12-01 DIAGNOSIS — G8929 Other chronic pain: Secondary | ICD-10-CM | POA: Diagnosis not present
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
## 2019-12-11 ENCOUNTER — Encounter: Payer: Self-pay | Admitting: Dietician

## 2019-12-11 ENCOUNTER — Ambulatory Visit: Payer: Medicare Other | Admitting: Dietician

## 2019-12-11 ENCOUNTER — Other Ambulatory Visit: Payer: Self-pay

## 2019-12-11 NOTE — Progress Notes (Signed)
This visit was rescheduled as patient is scheduled for an MRI next week which precludes him wearing the professional Continuous glucose monitoring for 14 days

## 2019-12-16 ENCOUNTER — Encounter: Payer: Self-pay | Admitting: Dietician

## 2019-12-17 ENCOUNTER — Ambulatory Visit: Payer: Medicare Other | Admitting: Orthopaedic Surgery

## 2019-12-17 ENCOUNTER — Other Ambulatory Visit: Payer: Self-pay

## 2019-12-18 ENCOUNTER — Encounter: Payer: Self-pay | Admitting: Dietician

## 2019-12-18 ENCOUNTER — Ambulatory Visit (INDEPENDENT_AMBULATORY_CARE_PROVIDER_SITE_OTHER): Payer: Medicare Other | Admitting: Dietician

## 2019-12-18 DIAGNOSIS — E1139 Type 2 diabetes mellitus with other diabetic ophthalmic complication: Secondary | ICD-10-CM | POA: Diagnosis not present

## 2019-12-18 DIAGNOSIS — E1165 Type 2 diabetes mellitus with hyperglycemia: Secondary | ICD-10-CM | POA: Diagnosis not present

## 2019-12-18 DIAGNOSIS — IMO0002 Reserved for concepts with insufficient information to code with codable children: Secondary | ICD-10-CM

## 2019-12-18 NOTE — Progress Notes (Signed)
Documentation for Freestyle Libre Pro Continuous glucose monitoring Freestyle Libre Pro CGM sensor placed today. Patient was educated about wearing sensor, keeping food, activity and medication log and when to call office. Patient was educated about how to care for the sensor and not to have an MRI, CT or Diathermy while wearing the sensor. Follow up was arranged with the patient for 1 week.   Lot #: [id number] Serial #: [id number] Expiration Date: 06/07/2020  Debera Lat, RD 12/18/2019 9:33 AM.

## 2019-12-18 NOTE — Patient Instructions (Signed)
Please record the time, amount and what food drinks and activities you have while wearing the continuous glucose monitor (CGM).  Bring the folder with you to follow up appointments. If your monitor falls off, please place it in the bag provided in your folder and bring it back with you to your next appointment.   Do not have a CT or an MRI while wearing the CGM.   1 week visit has been set up with me and a doctor for the first of two CGM downloads.   You will also return in 2 weeks to have your second download and the CGM removed.  Debera Lat, RD 12/18/2019 9:14 AM

## 2019-12-24 ENCOUNTER — Ambulatory Visit (INDEPENDENT_AMBULATORY_CARE_PROVIDER_SITE_OTHER): Payer: Medicare Other | Admitting: Dietician

## 2019-12-24 ENCOUNTER — Encounter: Payer: Self-pay | Admitting: Dietician

## 2019-12-24 ENCOUNTER — Other Ambulatory Visit: Payer: Self-pay

## 2019-12-24 DIAGNOSIS — Z713 Dietary counseling and surveillance: Secondary | ICD-10-CM

## 2019-12-24 DIAGNOSIS — E1139 Type 2 diabetes mellitus with other diabetic ophthalmic complication: Secondary | ICD-10-CM | POA: Diagnosis not present

## 2019-12-24 DIAGNOSIS — E1165 Type 2 diabetes mellitus with hyperglycemia: Secondary | ICD-10-CM | POA: Diagnosis not present

## 2019-12-24 DIAGNOSIS — IMO0002 Reserved for concepts with insufficient information to code with codable children: Secondary | ICD-10-CM

## 2019-12-24 NOTE — Progress Notes (Signed)
Richard [name] wore the CGM for 7 days. The average reading was 134, % time in target was 94, % time below target was 0, and % time above target was. 6. Intervention will be to diet/lifestyle coaching. The patient will be scheduled to see donna for a final appointment.

## 2019-12-24 NOTE — Progress Notes (Signed)
  Medical Nutrition Therapy:  Appt start time: 0810 end time:  0830 Visit # 6  Assessment:  Primary concerns today: blood sugar control.  Richard [name] is here for CGM download 1 to assist him with his goal of lower blood sugars and A1C of 6.5%.  He reports in the past week decreasing sugary items and sugar.   ANTHROPOMETRICS:  Wt Readings from Last 5 Encounters:  12/01/19 234 lb (106.1 kg)  11/19/19 237 lb 12.8 oz (107.9 kg)  10/05/19 235 lb (106.6 kg)  09/28/19 237 lb 3.2 oz (107.6 kg)  09/21/19 233 lb 9.6 oz (106 kg)   Lab Results  Component Value Date   HGBA1C 8.7 (A) 11/19/2019   HGBA1C 7.4 (A) 08/06/2019   HGBA1C 8.2 (A) 03/26/2019   HGBA1C 8.1 (A) 12/04/2018   HGBA1C 9.2 (A) 08/21/2018     MEDICATIONS: taking as directed BLOOD SUGAR: CGM results discussed with him; will be shared with Dr. Heber [place]. It shows excellent control.  CGM Results from Download #1  Average is  134  for 7 days Glucose management indicator 6.5 % Time in range (70-180 mg/dL): 94 % (Goal >70%) Time High (181-250 mg/dL) 6 % (Goal < 25%) Time Very High (>250 mg/dl) 0 % (Goal < 5%) Time Low (54-69 mg/dL): 0 % (Goal is <4%) Time Very Low (<54) 0%  (Goal <1%)  Coefficient of variation:20.4% (Goal is <36%)  Usual eating pattern- not discussed today  Progress Towards Goal(s):  Some progress.   Nutritional Diagnosis:  NI-5.8.2 Excessive carbohydrate intake As related to sugar consumption is improving As evidenced by his report and cgm results for the past week.    Intervention:  Nutrition counseling about his goals, used motivational interviewing techniques to assist Richard [name] in setting goals, rewards for achieving those goals. Education/reveiw about Continuous glucose monitoring results.   Coordination of care: share results with Dr. Heber [place].  Handouts given during visit include:none per patient who says he sees it in Mychart Barriers to learning/adherence to lifestyle change: competing  values Demonstrated degree of understanding via:  Teach Back   Monitoring/Evaluation:  Dietary intake, exercise, meter, and body weight in 1 week(s). Butch Penny [place]ulah Hosman, RD 12/24/2019 8:51 AM.

## 2019-12-31 ENCOUNTER — Encounter: Payer: Self-pay | Admitting: Dietician

## 2019-12-31 ENCOUNTER — Ambulatory Visit (INDEPENDENT_AMBULATORY_CARE_PROVIDER_SITE_OTHER): Payer: Medicare Other | Admitting: Dietician

## 2019-12-31 ENCOUNTER — Other Ambulatory Visit: Payer: Self-pay

## 2019-12-31 DIAGNOSIS — Z713 Dietary counseling and surveillance: Secondary | ICD-10-CM | POA: Diagnosis not present

## 2019-12-31 DIAGNOSIS — E1165 Type 2 diabetes mellitus with hyperglycemia: Secondary | ICD-10-CM | POA: Diagnosis not present

## 2019-12-31 DIAGNOSIS — E1139 Type 2 diabetes mellitus with other diabetic ophthalmic complication: Secondary | ICD-10-CM

## 2019-12-31 DIAGNOSIS — IMO0002 Reserved for concepts with insufficient information to code with codable children: Secondary | ICD-10-CM

## 2019-12-31 NOTE — Progress Notes (Signed)
  Medical Nutrition Therapy:  Appt start time: 0810 end time:  0825 Total time: 15 minutes Visit # 7  Assessment:  Primary concerns today: blood sugar control.  Richard [name] is here for CGM download 2 to assist him with his goal of lower blood sugars and A1C of 6.5%.  He reports in the past week continuing his decreased carb intake,  his support system, increased physical activity, and medication adherence. .   ANTHROPOMETRICS: did not want to weigh today MEDICATIONS: taking as directed BLOOD SUGAR: CGM results below discussed with him; will be shared with Dr. Heber [place]. It shows excellent control.  CGM Results from Download #2 (last weeks in parenthesis)  Average is  132 (134)  for 13 days Glucose management indicator 6.5% (6.5 %) Time in range (70-180 mg/dL): 93%(94 %) (Goal >70%) Time High (181-250 mg/dL) 7% (6 %) (Goal < 25%) Time Very High (>250 mg/dl) 0 (0)% (Goal < 5%) Time Low (54-69 mg/dL):0( 0) % (Goal is <4%) Time Very Low (<54) 0%  (Goal <1%)  Coefficient of variation: 22.3 (20.4)% (Goal is <36%)  Usual eating pattern- reports less snacking, less sugar, smaller portions, balacned meals  Progress Towards Goal(s):  Some progress.   Nutritional Diagnosis:  NI-5.8.2 Excessive carbohydrate intake As related to sugar consumption is improving As evidenced by his report and cgm results for the past two weeks.    Intervention:  Nutrition counseling about his goals, importance of and how to get the most out of his support system, maintaining excellent control between CGM visits. . Education/reveiw about Continuous glucose monitoring results.   Coordination of care: share results with Dr. Heber [place].  Handouts given during visit include:none per patient who says he sees it in Mychart Barriers to learning/adherence to lifestyle change: competing values Demonstrated degree of understanding via:  Teach Back   Monitoring/Evaluation:  Dietary intake, exercise, meter, and body weight in 12  week(s). Debera Lat, RD 12/31/2019 8:34 AM.

## 2019-12-31 NOTE — Patient Instructions (Addendum)
To continue the great diabetes control I will:  Keep eating what I am eating- less sugar, less snacks, balanced meals with low fat protein, veggies and starch Keep walking!!! Maybe get back into the gym...Marland KitchenMarland KitchenMarland Kitchen Support- maybe tell your support person how you are doing and what our goals are...  CONGRATULATIONS!!!!  Butch Penny [phone]

## 2020-01-16 ENCOUNTER — Ambulatory Visit
Admission: RE | Admit: 2020-01-16 | Discharge: 2020-01-16 | Disposition: A | Discharge status: Home / Self Care | Payer: Medicare Other | Source: Ambulatory Visit | Attending: Orthopaedic Surgery | Admitting: Orthopaedic Surgery

## 2020-01-16 DIAGNOSIS — G8929 Other chronic pain: Secondary | ICD-10-CM

## 2020-01-16 DIAGNOSIS — M25511 Pain in right shoulder: Secondary | ICD-10-CM | POA: Diagnosis not present

## 2020-01-19 ENCOUNTER — Ambulatory Visit: Payer: Medicare Other | Admitting: Orthopaedic Surgery

## 2020-01-21 ENCOUNTER — Other Ambulatory Visit: Payer: Self-pay | Admitting: *Deleted

## 2020-01-21 MED ORDER — METFORMIN HCL 1000 MG PO TABS: 1000.0000 mg | ORAL_TABLET | Freq: Two times a day (BID) | ORAL | 1 refills | Status: DC

## 2020-01-21 MED ORDER — LEVOCETIRIZINE DIHYDROCHLORIDE 5 MG PO TABS: 5.0000 mg | ORAL_TABLET | Freq: Every evening | ORAL | 0 refills | Status: DC

## 2020-01-29 ENCOUNTER — Ambulatory Visit: Payer: Medicare Other | Admitting: Orthopaedic Surgery

## 2020-01-29 ENCOUNTER — Ambulatory Visit (INDEPENDENT_AMBULATORY_CARE_PROVIDER_SITE_OTHER): Payer: Medicare Other | Admitting: Orthopaedic Surgery

## 2020-01-29 ENCOUNTER — Encounter: Payer: Self-pay | Admitting: Orthopaedic Surgery

## 2020-01-29 ENCOUNTER — Ambulatory Visit (INDEPENDENT_AMBULATORY_CARE_PROVIDER_SITE_OTHER): Payer: Medicare Other | Admitting: Podiatry

## 2020-01-29 ENCOUNTER — Other Ambulatory Visit: Payer: Self-pay

## 2020-01-29 ENCOUNTER — Encounter: Payer: Self-pay | Admitting: Podiatry

## 2020-01-29 DIAGNOSIS — M67921 Unspecified disorder of synovium and tendon, right upper arm: Secondary | ICD-10-CM | POA: Diagnosis not present

## 2020-01-29 DIAGNOSIS — M79674 Pain in right toe(s): Secondary | ICD-10-CM | POA: Diagnosis not present

## 2020-01-29 DIAGNOSIS — M7541 Impingement syndrome of right shoulder: Secondary | ICD-10-CM | POA: Insufficient documentation

## 2020-01-29 DIAGNOSIS — L84 Corns and callosities: Secondary | ICD-10-CM

## 2020-01-29 DIAGNOSIS — M79675 Pain in left toe(s): Secondary | ICD-10-CM

## 2020-01-29 DIAGNOSIS — E1142 Type 2 diabetes mellitus with diabetic polyneuropathy: Secondary | ICD-10-CM

## 2020-01-29 DIAGNOSIS — B351 Tinea unguium: Secondary | ICD-10-CM | POA: Diagnosis not present

## 2020-01-29 NOTE — Progress Notes (Signed)
This patient returns to my office for at risk foot care.  This patient requires this care by a professional since this patient will be at risk due to having diabetes and PAD.   This patient also has a painful callus right forefoot which has just started to hurt.  This patient is unable to cut nails himself since the patient cannot reach his nails.These nails are painful walking and wearing shoes. Patient says the skin lesion has thickened.  He also says the the treatment provided by [facility] to off load the callus has helped relieve his pain.  This patient presents for at risk foot care today.  General Appearance  Alert, conversant and in no acute stress.  Vascular  Dorsalis pedis and posterior tibial  pulses are palpable  bilaterally.  Capillary return is within normal limits  bilaterally. Temperature is within normal limits  bilaterally.  Neurologic  Senn-Weinstein monofilament wire test absent   bilaterally. Muscle power within normal limits bilaterally.  Nails Thick disfigured discolored nails with subungual debris  from hallux to fifth toes bilaterally. No evidence of bacterial infection or drainage bilaterally.  Orthopedic  No limitations of motion  feet .  No crepitus or effusions noted.  No bony pathology or digital deformities noted. Hammer toes  B/L.  Skin  Thick necrotic skin lesion sub 2 right foot.  Hemorrhagic callus noted.  No signs of infections or ulcers noted.     Onychomycosis  Pain in right toes  Pain in left toes  Consent was obtained for treatment procedures.   Mechanical debridement of nails 1-5  bilaterally performed with a nail nipper.  Filed with dremel without incident.   Debridement of skin lesion with a # 15 blade.  Asked Dr. Posey Pronto to check on this skin lesion and he felt that our treatment was appropriate for this lesion.   Return office visit   10 weeks.                  Told patient to return for periodic foot care and evaluation due to potential at risk  complications.   Gardiner Barefoot DPM
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
## 2020-02-03 ENCOUNTER — Other Ambulatory Visit: Payer: Self-pay | Admitting: Internal Medicine

## 2020-02-08 ENCOUNTER — Other Ambulatory Visit: Payer: Self-pay

## 2020-02-08 ENCOUNTER — Ambulatory Visit: Payer: Medicare Other | Admitting: Orthotics

## 2020-02-08 DIAGNOSIS — E1142 Type 2 diabetes mellitus with diabetic polyneuropathy: Secondary | ICD-10-CM

## 2020-02-08 DIAGNOSIS — E08621 Diabetes mellitus due to underlying condition with foot ulcer: Secondary | ICD-10-CM

## 2020-02-08 DIAGNOSIS — L84 Corns and callosities: Secondary | ICD-10-CM

## 2020-02-08 NOTE — Progress Notes (Signed)
Added offload (k-wedge type) to remaining two DBS insertsw.

## 2020-02-18 ENCOUNTER — Ambulatory Visit (INDEPENDENT_AMBULATORY_CARE_PROVIDER_SITE_OTHER): Payer: Medicare Other | Admitting: Internal Medicine

## 2020-02-18 ENCOUNTER — Encounter: Payer: Self-pay | Admitting: Internal Medicine

## 2020-02-18 ENCOUNTER — Other Ambulatory Visit: Payer: Self-pay

## 2020-02-18 VITALS — BP 136/78 | HR 82 | Temp 98.0°F | Ht 74.0 in | Wt 233.8 lb

## 2020-02-18 DIAGNOSIS — E1165 Type 2 diabetes mellitus with hyperglycemia: Secondary | ICD-10-CM

## 2020-02-18 DIAGNOSIS — I10 Essential (primary) hypertension: Secondary | ICD-10-CM

## 2020-02-18 DIAGNOSIS — E1139 Type 2 diabetes mellitus with other diabetic ophthalmic complication: Secondary | ICD-10-CM

## 2020-02-18 DIAGNOSIS — H6123 Impacted cerumen, bilateral: Secondary | ICD-10-CM

## 2020-02-18 DIAGNOSIS — J449 Chronic obstructive pulmonary disease, unspecified: Secondary | ICD-10-CM

## 2020-02-18 DIAGNOSIS — J4550 Severe persistent asthma, uncomplicated: Secondary | ICD-10-CM

## 2020-02-18 DIAGNOSIS — H6121 Impacted cerumen, right ear: Secondary | ICD-10-CM

## 2020-02-18 DIAGNOSIS — E1140 Type 2 diabetes mellitus with diabetic neuropathy, unspecified: Secondary | ICD-10-CM

## 2020-02-18 DIAGNOSIS — IMO0002 Reserved for concepts with insufficient information to code with codable children: Secondary | ICD-10-CM

## 2020-02-18 DIAGNOSIS — M7541 Impingement syndrome of right shoulder: Secondary | ICD-10-CM

## 2020-02-18 LAB — POCT GLYCOSYLATED HEMOGLOBIN (HGB A1C): Hemoglobin A1C: 7.6 % — AB (ref 4.0–5.6)

## 2020-02-18 LAB — GLUCOSE, CAPILLARY: Glucose-Capillary: 102 mg/dL — ABNORMAL HIGH (ref 70–99)

## 2020-02-18 MED ORDER — MONTELUKAST SODIUM 10 MG PO TABS: 10.0000 mg | ORAL_TABLET | Freq: Every day | ORAL | 3 refills | Status: DC

## 2020-02-18 MED ORDER — PREGABALIN 100 MG PO CAPS: 100.0000 mg | ORAL_CAPSULE | Freq: Three times a day (TID) | ORAL | 5 refills | Status: DC

## 2020-02-18 MED ORDER — SYNJARDY 5-1000 MG PO TABS: 1.0000 | ORAL_TABLET | Freq: Two times a day (BID) | ORAL | 3 refills | Status: DC

## 2020-02-18 MED ORDER — LEVOCETIRIZINE DIHYDROCHLORIDE 5 MG PO TABS: 5.0000 mg | ORAL_TABLET | Freq: Every evening | ORAL | 3 refills | Status: DC

## 2020-02-18 MED ORDER — ALBUTEROL SULFATE HFA 108 (90 BASE) MCG/ACT IN AERS: 2.0000 | INHALATION_SPRAY | RESPIRATORY_TRACT | 5 refills | Status: DC | PRN

## 2020-02-18 NOTE — Progress Notes (Signed)
  Subjective:  HPI: [name] is a 59 y.o. male who presents for f/u T2DM  Please see Assessment and Plan below for the status of his chronic medical problems.  Objective:  Physical Exam: Vitals:   02/18/20 0840  BP: 136/78  Pulse: 82  Temp: 98 F (36.7 C)  TempSrc: Oral  SpO2: 99%  Weight: 233 lb 12.8 oz (106.1 kg)  Height: 6\' 2"  (1.88 m)   Body mass index is 30.02 kg/m. Physical Exam Vitals and nursing note reviewed.  Constitutional:      Appearance: Normal appearance.  HENT:     Head:     Comments: Initially right ear TM completely obstructed from cerumen impaction, after lavage cerumen still covering 60%, right ear hearing normalized, left ear with 80% obstruction from cerumen    Right Ear: There is impacted cerumen.     Left Ear: There is impacted cerumen.  Cardiovascular:     Rate and Rhythm: Normal rate and regular rhythm.  Neurological:     Mental Status: He is alert.  Psychiatric:        Mood and Affect: Mood normal.        Behavior: Behavior normal.    Assessment & Plan:  See Encounters Tab for problem based charting.  Medications Ordered Meds ordered this encounter  Medications  . levocetirizine (XYZAL) 5 MG tablet    Sig: Take 1 tablet (5 mg total) by mouth every evening.    Dispense:  90 tablet    Refill:  3  . montelukast (SINGULAIR) 10 MG tablet    Sig: Take 1 tablet (10 mg total) by mouth at bedtime.    Dispense:  90 tablet    Refill:  3  . Empagliflozin-metFORMIN HCl (SYNJARDY) 02-999 MG TABS    Sig: Take 1 tablet by mouth 2 (two) times daily.    Dispense:  180 tablet    Refill:  3    Place on file.  Will replace Jardiance and Metformin individual refills.  Marland Kitchen albuterol (PROAIR HFA) 108 (90 Base) MCG/ACT inhaler    Sig: Inhale 2 puffs into the lungs every 4 (four) hours as needed for wheezing or shortness of breath.    Dispense:  18 g    Refill:  5  . pregabalin (LYRICA) 100 MG capsule    Sig: Take 1 capsule (100 mg total) by  mouth 3 (three) times daily.    Dispense:  90 capsule    Refill:  5   Other Orders Orders Placed This Encounter  Procedures  . BMP8+Anion Gap  . Lipid Profile  . CBC with Diff  . Microalbumin / Creatinine Urine Ratio  . Glucose, capillary  . Ambulatory referral to Ophthalmology    Referral Priority:   Routine    Referral Type:   Consultation    Referral Reason:   Specialty Services Required    Requested Specialty:   Ophthalmology    Number of Visits Requested:   1  . Ambulatory referral to ENT    Referral Priority:   Routine    Referral Type:   Consultation    Referral Reason:   Specialty Services Required    Requested Specialty:   Otolaryngology    Number of Visits Requested:   1  . POC Hbg A1C   Follow Up: Return in about 3 months (around 05/20/2020).

## 2020-02-18 NOTE — Assessment & Plan Note (Signed)
HPI: He is reporting some decreased hearing in his right ear.  He does admit to occasional use of Q-tips.  Assessment hearing loss right ear due to cerumen impaction  Plan Attempted ear lavage today with improvement in hearing loss however incomplete removal of cerumen.  This been a recurrent issue for him I discussed over-the-counter lavage kit but will also refer him to ENT for reevaluation

## 2020-02-18 NOTE — Assessment & Plan Note (Signed)
HPI: Blood pressure remains well controlled at 136/78 on medication with blood pressure effect is empagliflozin.  Assessment well-controlled essential hypertension  Plan Continuing to monitor blood pressure goal less than 140/90 Continue empagliflozin

## 2020-02-18 NOTE — Assessment & Plan Note (Signed)
HPI: He has been following with Dr. Erlinda Hong, for his suspected left shoulder impingement.  Steroid injections continue to be incomplete relief he recently got an MRI which does show some evidence of shoulder impingement but also has a minor superior labrum tear as well as arthritis of the [facility] joint.  He reports that he is not interested in surgery at this time which Dr.Xu feels is reasonable he will follow-up if pain worsens.  Assessment impingement right shoulder  Plan I discussed that he has a reasonable plan I encouraged him to take the right shoulder through range of motion exercises daily to prevent frozen shoulder

## 2020-02-18 NOTE — Assessment & Plan Note (Signed)
HPI: Reports blood sugars have improved since her last visit.  Still sometimes eating the wrong things including potato chips but continues to be a work in progress.  Overall tolerating his medications very well reports good adherence to all medications.  No hypoglycemia.  Assessment uncontrolled type 2 diabetes with diabetic retinopathy, peripheral vascular disease and diabetic neuropathy  Plan Discussed importance of follow-up with ophthalmology placed referral Continue Synjardy 02-999 twice daily Continue Actos 30 mg daily Continue Trulicity 1.5 mg weekly  Continue Lyrica for diabetic neuropathy

## 2020-02-18 NOTE — Assessment & Plan Note (Signed)
HPI: Overall he reports he is stable however is feeling slightly more short of breath due to allergies.  Requesting refill of Singulair and Xyzal.  Assessment asthma with COPD overlap syndrome  Plan Refill Singulair and Xyzal.

## 2020-02-19 LAB — CBC WITH DIFFERENTIAL/PLATELET
Basophils Absolute: 0.1 10*3/uL (ref 0.0–0.2)
Basos: 1 %
EOS (ABSOLUTE): 0.5 10*3/uL — ABNORMAL HIGH (ref 0.0–0.4)
Eos: 12 %
Hematocrit: 42.1 % (ref 37.5–51.0)
Hemoglobin: 13.3 g/dL (ref 13.0–17.7)
Immature Grans (Abs): 0 10*3/uL (ref 0.0–0.1)
Immature Granulocytes: 0 %
Lymphocytes Absolute: 0.8 10*3/uL (ref 0.7–3.1)
Lymphs: 18 %
MCH: 26 pg — ABNORMAL LOW (ref 26.6–33.0)
MCHC: 31.6 g/dL (ref 31.5–35.7)
MCV: 82 fL (ref 79–97)
Monocytes Absolute: 0.5 10*3/uL (ref 0.1–0.9)
Monocytes: 12 %
Neutrophils Absolute: 2.4 10*3/uL (ref 1.4–7.0)
Neutrophils: 57 %
Platelets: 238 10*3/uL (ref 150–450)
RBC: 5.12 x10E6/uL (ref 4.14–5.80)
RDW: 14 % (ref 11.6–15.4)
WBC: 4.2 10*3/uL (ref 3.4–10.8)

## 2020-02-19 LAB — LIPID PANEL
Chol/HDL Ratio: 2 ratio (ref 0.0–5.0)
Cholesterol, Total: 114 mg/dL (ref 100–199)
HDL: 56 mg/dL (ref >39)
LDL Chol Calc (NIH): 46 mg/dL (ref 0–99)
Triglycerides: 54 mg/dL (ref 0–149)
VLDL Cholesterol Cal: 12 mg/dL (ref 5–40)

## 2020-02-19 LAB — BMP8+ANION GAP
Anion Gap: 13 mmol/L (ref 10.0–18.0)
BUN/Creatinine Ratio: 13 (ref 10–24)
BUN: 12 mg/dL (ref 8–27)
CO2: 24 mmol/L (ref 20–29)
Calcium: 9.2 mg/dL (ref 8.6–10.2)
Chloride: 101 mmol/L (ref 96–106)
Creatinine, Ser: 0.91 mg/dL (ref 0.76–1.27)
GFR calc Af Amer: 106 mL/min/{1.73_m2} (ref >59)
GFR calc non Af Amer: 91 mL/min/{1.73_m2} (ref >59)
Glucose: 109 mg/dL — ABNORMAL HIGH (ref 65–99)
Potassium: 4.9 mmol/L (ref 3.5–5.2)
Sodium: 138 mmol/L (ref 134–144)

## 2020-02-19 LAB — MICROALBUMIN / CREATININE URINE RATIO
Creatinine, Urine: 68.2 mg/dL
Microalb/Creat Ratio: 6 mg/g creat (ref 0–29)
Microalbumin, Urine: 4.3 ug/mL

## 2020-03-08 ENCOUNTER — Telehealth: Payer: Self-pay | Admitting: *Deleted

## 2020-03-08 NOTE — Telephone Encounter (Signed)
CALLED PATIENT AND LEFT VOICE MESSAGE FOR PATIENT TO RETURN CALL TO CLINIC.  PATIENT NEEDS TO CALL ENT OFFICE TO RE SCHEDULE APPOINTMENT.  HE NO SHOWED FOR SCHEDULE APPOINTMENT MAY 27. 021.

## 2020-03-10 ENCOUNTER — Ambulatory Visit: Payer: Medicare Other | Admitting: Dietician

## 2020-03-14 ENCOUNTER — Telehealth: Payer: Self-pay

## 2020-03-14 ENCOUNTER — Other Ambulatory Visit: Payer: Self-pay | Admitting: Internal Medicine

## 2020-03-14 NOTE — Telephone Encounter (Signed)
Thank you :)

## 2020-03-14 NOTE — Telephone Encounter (Signed)
Refill Request-Patient requesting a callback about getting his Medication listed below at no cost. Please call patient back.  albuterol (PROVENTIL) (2.5 MG/3ML) 0.083% nebulizer solution    [place], [place]

## 2020-03-14 NOTE — Telephone Encounter (Signed)
Dr Heber Amboy, you dont need to do anything at the moment, I have sent this to [facility]win for assist

## 2020-03-14 NOTE — Telephone Encounter (Signed)
I am not sure what exactly the pt is needing.  He states that [facility] is billing Medicare Part B, but 'the Dr. Has to do something to change it to Part D'?  He is going to try to get someone from ins to call and explain.  I am not familiar with this, maybe he can get more information to explain the situation better.  He has been paying $5-$6 for the neb. Sol. And his ins is telling him it should be free, but he cannot explain how he expects the MD to help.  I gave the pt my direct # in hopes that the ins may call and explain the situation.

## 2020-03-14 NOTE — Telephone Encounter (Signed)
Spoke to [employer] regarding Albuterol [employer] [facility] billing and [facility] stated that when they spoke to pt they advised him to contact his ins regarding drug cost if he had questions.  As far as they can determine, billing is correct.  Patient initially stated that he was told by [facility] to have MD change something(unclear what this may be) so that his copay will change to $0.  I advised pt to speak with his ins again and if needed to have them contact the MD office if further instruction/changes were needed so that they may better explain the situation.

## 2020-03-15 NOTE — Telephone Encounter (Deleted)
error 

## 2020-03-17 MED ORDER — ALBUTEROL SULFATE HFA 108 (90 BASE) MCG/ACT IN AERS: 2.0000 | INHALATION_SPRAY | RESPIRATORY_TRACT | 5 refills | Status: DC | PRN

## 2020-04-08 ENCOUNTER — Encounter: Payer: Self-pay | Admitting: Podiatry

## 2020-04-08 ENCOUNTER — Ambulatory Visit (INDEPENDENT_AMBULATORY_CARE_PROVIDER_SITE_OTHER): Payer: Medicare Other | Admitting: Podiatry

## 2020-04-08 ENCOUNTER — Other Ambulatory Visit: Payer: Self-pay

## 2020-04-08 DIAGNOSIS — B351 Tinea unguium: Secondary | ICD-10-CM | POA: Diagnosis not present

## 2020-04-08 DIAGNOSIS — M79675 Pain in left toe(s): Secondary | ICD-10-CM

## 2020-04-08 DIAGNOSIS — E1140 Type 2 diabetes mellitus with diabetic neuropathy, unspecified: Secondary | ICD-10-CM | POA: Diagnosis not present

## 2020-04-08 DIAGNOSIS — L84 Corns and callosities: Secondary | ICD-10-CM

## 2020-04-08 DIAGNOSIS — M79674 Pain in right toe(s): Secondary | ICD-10-CM

## 2020-04-08 NOTE — Progress Notes (Signed)
This patient returns to my office for at risk foot care.  This patient requires this care by a professional since this patient will be at risk due to having diabetes and PAD.   This patient also has a painful callus right forefoot which has just started to hurt.  This patient is unable to cut nails himself since the patient cannot reach his nails.These nails are painful walking and wearing shoes. Patient says the skin lesion has thickened.   This patient presents for at risk foot care today.  General Appearance  Alert, conversant and in no acute stress.  Vascular  Dorsalis pedis and posterior tibial  pulses are palpable  bilaterally.  Capillary return is within normal limits  bilaterally. Temperature is within normal limits  bilaterally.  Neurologic  Senn-Weinstein monofilament wire test absent   bilaterally. Muscle power within normal limits bilaterally.  Nails Thick disfigured discolored nails with subungual debris  from hallux to fifth toes bilaterally. No evidence of bacterial infection or drainage bilaterally.  Orthopedic  No limitations of motion  feet .  No crepitus or effusions noted.  No bony pathology or digital deformities noted. Hammer toes  B/L.  Skin  Thick necrotic skin lesion sub 2 right foot.  Hemorrhagic callus noted.  No signs of infections or ulcers noted.     Onychomycosis  Pain in right toes  Pain in left toes  Consent was obtained for treatment procedures.   Mechanical debridement of nails 1-5  bilaterally performed with a nail nipper.  Filed with dremel without incident.   Debridement of skin lesion with a # 15 blade.   Return office visit   9  weeks.                  Told patient to return for periodic foot care and evaluation due to potential at risk complications.   Sinthia Karabin DPM  

## 2020-04-28 LAB — HM DIABETES EYE EXAM

## 2020-06-06 ENCOUNTER — Encounter: Payer: Self-pay | Admitting: *Deleted

## 2020-07-01 ENCOUNTER — Encounter: Payer: Self-pay | Admitting: Podiatry

## 2020-07-01 ENCOUNTER — Other Ambulatory Visit: Payer: Self-pay

## 2020-07-01 ENCOUNTER — Ambulatory Visit (INDEPENDENT_AMBULATORY_CARE_PROVIDER_SITE_OTHER): Payer: Medicare Other | Admitting: Podiatry

## 2020-07-01 DIAGNOSIS — M79675 Pain in left toe(s): Secondary | ICD-10-CM | POA: Diagnosis not present

## 2020-07-01 DIAGNOSIS — M79674 Pain in right toe(s): Secondary | ICD-10-CM | POA: Diagnosis not present

## 2020-07-01 DIAGNOSIS — L84 Corns and callosities: Secondary | ICD-10-CM | POA: Diagnosis not present

## 2020-07-01 DIAGNOSIS — B351 Tinea unguium: Secondary | ICD-10-CM

## 2020-07-01 DIAGNOSIS — E1140 Type 2 diabetes mellitus with diabetic neuropathy, unspecified: Secondary | ICD-10-CM | POA: Diagnosis not present

## 2020-07-01 NOTE — Progress Notes (Signed)
This patient returns to my office for at risk foot care.  This patient requires this care by a professional since this patient will be at risk due to having diabetes and PAD.   This patient also has a painful callus right forefoot which has just started to hurt.  This patient is unable to cut nails himself since the patient cannot reach his nails.These nails are painful walking and wearing shoes. Patient says the skin lesion has thickened.   This patient presents for at risk foot care today.  General Appearance  Alert, conversant and in no acute stress.  Vascular  Dorsalis pedis and posterior tibial  pulses are palpable  bilaterally.  Capillary return is within normal limits  bilaterally. Temperature is within normal limits  bilaterally.  Neurologic  Senn-Weinstein monofilament wire test absent   bilaterally. Muscle power within normal limits bilaterally.  Nails Thick disfigured discolored nails with subungual debris  from hallux to fifth toes bilaterally. No evidence of bacterial infection or drainage bilaterally.  Orthopedic  No limitations of motion  feet .  No crepitus or effusions noted.  No bony pathology or digital deformities noted. Hammer toes  B/L.  Skin  Thick necrotic skin lesion sub 2 right foot.  Hemorrhagic callus noted.  No signs of infections or ulcers noted.     Onychomycosis  Pain in right toes  Pain in left toes  Consent was obtained for treatment procedures.   Mechanical debridement of nails 1-5  bilaterally performed with a nail nipper.  Filed with dremel without incident.   Debridement of skin lesion with a # 15 blade.   Return office visit   9  weeks.                  Told patient to return for periodic foot care and evaluation due to potential at risk complications.   Gardiner Barefoot DPM

## 2020-07-19 ENCOUNTER — Observation Stay (HOSPITAL_COMMUNITY): Payer: Medicare Other

## 2020-07-19 ENCOUNTER — Emergency Department (HOSPITAL_COMMUNITY): Payer: Medicare Other

## 2020-07-19 ENCOUNTER — Telehealth: Payer: Self-pay | Admitting: Internal Medicine

## 2020-07-19 ENCOUNTER — Emergency Department (HOSPITAL_BASED_OUTPATIENT_CLINIC_OR_DEPARTMENT_OTHER): Payer: Medicare Other

## 2020-07-19 ENCOUNTER — Inpatient Hospital Stay (HOSPITAL_COMMUNITY)
Admission: EM | Admit: 2020-07-19 | Discharge: 2020-07-23 | DRG: 638 | Disposition: A | Discharge status: Home / Self Care | Payer: Medicare Other | Attending: Internal Medicine | Admitting: Internal Medicine

## 2020-07-19 DIAGNOSIS — R0789 Other chest pain: Secondary | ICD-10-CM | POA: Diagnosis present

## 2020-07-19 DIAGNOSIS — Z7984 Long term (current) use of oral hypoglycemic drugs: Secondary | ICD-10-CM

## 2020-07-19 DIAGNOSIS — L97529 Non-pressure chronic ulcer of other part of left foot with unspecified severity: Secondary | ICD-10-CM | POA: Diagnosis present

## 2020-07-19 DIAGNOSIS — Z87891 Personal history of nicotine dependence: Secondary | ICD-10-CM

## 2020-07-19 DIAGNOSIS — L03116 Cellulitis of left lower limb: Secondary | ICD-10-CM

## 2020-07-19 DIAGNOSIS — I10 Essential (primary) hypertension: Secondary | ICD-10-CM | POA: Diagnosis present

## 2020-07-19 DIAGNOSIS — E11628 Type 2 diabetes mellitus with other skin complications: Principal | ICD-10-CM | POA: Diagnosis present

## 2020-07-19 DIAGNOSIS — Z7982 Long term (current) use of aspirin: Secondary | ICD-10-CM

## 2020-07-19 DIAGNOSIS — Z20822 Contact with and (suspected) exposure to covid-19: Secondary | ICD-10-CM | POA: Diagnosis present

## 2020-07-19 DIAGNOSIS — E1151 Type 2 diabetes mellitus with diabetic peripheral angiopathy without gangrene: Secondary | ICD-10-CM | POA: Diagnosis present

## 2020-07-19 DIAGNOSIS — M79609 Pain in unspecified limb: Secondary | ICD-10-CM | POA: Diagnosis not present

## 2020-07-19 DIAGNOSIS — R079 Chest pain, unspecified: Secondary | ICD-10-CM | POA: Diagnosis present

## 2020-07-19 DIAGNOSIS — E1141 Type 2 diabetes mellitus with diabetic mononeuropathy: Secondary | ICD-10-CM | POA: Diagnosis present

## 2020-07-19 DIAGNOSIS — J4489 Other specified chronic obstructive pulmonary disease: Secondary | ICD-10-CM | POA: Diagnosis present

## 2020-07-19 DIAGNOSIS — E1165 Type 2 diabetes mellitus with hyperglycemia: Secondary | ICD-10-CM | POA: Diagnosis present

## 2020-07-19 DIAGNOSIS — Z791 Long term (current) use of non-steroidal anti-inflammatories (NSAID): Secondary | ICD-10-CM

## 2020-07-19 DIAGNOSIS — H409 Unspecified glaucoma: Secondary | ICD-10-CM | POA: Diagnosis present

## 2020-07-19 DIAGNOSIS — S91312A Laceration without foreign body, left foot, initial encounter: Secondary | ICD-10-CM

## 2020-07-19 DIAGNOSIS — E785 Hyperlipidemia, unspecified: Secondary | ICD-10-CM | POA: Diagnosis present

## 2020-07-19 DIAGNOSIS — M7989 Other specified soft tissue disorders: Secondary | ICD-10-CM | POA: Diagnosis not present

## 2020-07-19 DIAGNOSIS — L0390 Cellulitis, unspecified: Secondary | ICD-10-CM

## 2020-07-19 DIAGNOSIS — Z79899 Other long term (current) drug therapy: Secondary | ICD-10-CM

## 2020-07-19 DIAGNOSIS — Z8249 Family history of ischemic heart disease and other diseases of the circulatory system: Secondary | ICD-10-CM

## 2020-07-19 DIAGNOSIS — Z83511 Family history of glaucoma: Secondary | ICD-10-CM

## 2020-07-19 DIAGNOSIS — J449 Chronic obstructive pulmonary disease, unspecified: Secondary | ICD-10-CM | POA: Diagnosis present

## 2020-07-19 DIAGNOSIS — E11621 Type 2 diabetes mellitus with foot ulcer: Secondary | ICD-10-CM | POA: Diagnosis present

## 2020-07-19 DIAGNOSIS — R609 Edema, unspecified: Secondary | ICD-10-CM

## 2020-07-19 DIAGNOSIS — E1139 Type 2 diabetes mellitus with other diabetic ophthalmic complication: Secondary | ICD-10-CM | POA: Diagnosis present

## 2020-07-19 LAB — BASIC METABOLIC PANEL
Anion gap: 11 (ref 5–15)
BUN: 14 mg/dL (ref 8–23)
CO2: 22 mmol/L (ref 22–32)
Calcium: 9.2 mg/dL (ref 8.9–10.3)
Chloride: 100 mmol/L (ref 98–111)
Creatinine, Ser: 1.15 mg/dL (ref 0.61–1.24)
GFR, Estimated: >60 mL/min (ref >60)
Glucose, Bld: 175 mg/dL — ABNORMAL HIGH (ref 70–99)
Potassium: 4.6 mmol/L (ref 3.5–5.1)
Sodium: 133 mmol/L — ABNORMAL LOW (ref 135–145)

## 2020-07-19 LAB — LACTIC ACID, PLASMA: Lactic Acid, Venous: 0.9 mmol/L (ref 0.5–1.9)

## 2020-07-19 LAB — RESPIRATORY PANEL BY RT PCR (FLU A&B, COVID)
Influenza A by PCR: NEGATIVE
Influenza B by PCR: NEGATIVE
SARS Coronavirus 2 by RT PCR: NEGATIVE

## 2020-07-19 LAB — CBC
HCT: 41.5 % (ref 39.0–52.0)
Hemoglobin: 13 g/dL (ref 13.0–17.0)
MCH: 26.7 pg (ref 26.0–34.0)
MCHC: 31.3 g/dL (ref 30.0–36.0)
MCV: 85.2 fL (ref 80.0–100.0)
Platelets: 275 10*3/uL (ref 150–400)
RBC: 4.87 MIL/uL (ref 4.22–5.81)
RDW: 14.2 % (ref 11.5–15.5)
WBC: 12.3 10*3/uL — ABNORMAL HIGH (ref 4.0–10.5)
nRBC: 0 % (ref 0.0–0.2)

## 2020-07-19 LAB — TROPONIN I (HIGH SENSITIVITY)
Troponin I (High Sensitivity): 8 ng/L (ref <18)
Troponin I (High Sensitivity): 6 ng/L (ref <18)

## 2020-07-19 MED ORDER — IOHEXOL 350 MG/ML SOLN
100.0000 mL | Freq: Once | INTRAVENOUS | Status: AC | PRN
  Administered 2020-07-19: 71 mL via INTRAVENOUS

## 2020-07-19 MED ORDER — ATORVASTATIN CALCIUM 40 MG PO TABS
40.0000 mg | ORAL_TABLET | Freq: Every day | ORAL | Status: DC
  Administered 2020-07-20 – 2020-07-23 (×4): 40 mg via ORAL
  Filled 2020-07-19 (×4): qty 1

## 2020-07-19 MED ORDER — LATANOPROST 0.005 % OP SOLN
1.0000 [drp] | Freq: Every day | OPHTHALMIC | Status: DC
  Administered 2020-07-21 – 2020-07-22 (×2): 1 [drp] via OPHTHALMIC
  Filled 2020-07-19 (×2): qty 2.5

## 2020-07-19 MED ORDER — SODIUM CHLORIDE 0.9 % IV BOLUS
1000.0000 mL | Freq: Once | INTRAVENOUS | Status: AC
  Administered 2020-07-19: 1000 mL via INTRAVENOUS

## 2020-07-19 MED ORDER — ACETAMINOPHEN 650 MG RE SUPP: 650.0000 mg | Freq: Four times a day (QID) | RECTAL | Status: DC | PRN

## 2020-07-19 MED ORDER — PREGABALIN 100 MG PO CAPS
100.0000 mg | ORAL_CAPSULE | Freq: Three times a day (TID) | ORAL | Status: DC
  Administered 2020-07-20 – 2020-07-23 (×10): 100 mg via ORAL
  Filled 2020-07-19 (×10): qty 1

## 2020-07-19 MED ORDER — INSULIN ASPART 100 UNIT/ML ~~LOC~~ SOLN
0.0000 [IU] | Freq: Three times a day (TID) | SUBCUTANEOUS | Status: DC
  Administered 2020-07-20 (×2): 3 [IU] via SUBCUTANEOUS
  Administered 2020-07-20 – 2020-07-21 (×2): 2 [IU] via SUBCUTANEOUS
  Administered 2020-07-21 (×2): 3 [IU] via SUBCUTANEOUS
  Administered 2020-07-22: 2 [IU] via SUBCUTANEOUS
  Administered 2020-07-22: 3 [IU] via SUBCUTANEOUS
  Administered 2020-07-22 – 2020-07-23 (×3): 2 [IU] via SUBCUTANEOUS

## 2020-07-19 MED ORDER — PREGABALIN 100 MG PO CAPS
100.0000 mg | ORAL_CAPSULE | Freq: Once | ORAL | Status: AC
  Administered 2020-07-19: 100 mg via ORAL
  Filled 2020-07-19: qty 1

## 2020-07-19 MED ORDER — MONTELUKAST SODIUM 10 MG PO TABS
10.0000 mg | ORAL_TABLET | Freq: Every day | ORAL | Status: DC
  Administered 2020-07-19 – 2020-07-22 (×4): 10 mg via ORAL
  Filled 2020-07-19 (×4): qty 1

## 2020-07-19 MED ORDER — ENOXAPARIN SODIUM 40 MG/0.4ML ~~LOC~~ SOLN
40.0000 mg | SUBCUTANEOUS | Status: DC
  Administered 2020-07-20 – 2020-07-23 (×4): 40 mg via SUBCUTANEOUS
  Filled 2020-07-19 (×4): qty 0.4

## 2020-07-19 MED ORDER — CEFAZOLIN SODIUM-DEXTROSE 1-4 GM/50ML-% IV SOLN
1.0000 g | Freq: Once | INTRAVENOUS | Status: AC
  Administered 2020-07-19: 1 g via INTRAVENOUS
  Filled 2020-07-19: qty 50

## 2020-07-19 MED ORDER — TIMOLOL MALEATE 0.5 % OP SOLN
1.0000 [drp] | Freq: Two times a day (BID) | OPHTHALMIC | Status: DC
  Administered 2020-07-20 – 2020-07-22 (×6): 1 [drp] via OPHTHALMIC
  Filled 2020-07-19: qty 5

## 2020-07-19 MED ORDER — CEFAZOLIN SODIUM-DEXTROSE 1-4 GM/50ML-% IV SOLN
1.0000 g | Freq: Three times a day (TID) | INTRAVENOUS | Status: DC
  Administered 2020-07-20 – 2020-07-22 (×7): 1 g via INTRAVENOUS
  Filled 2020-07-19 (×10): qty 50

## 2020-07-19 MED ORDER — LORATADINE 10 MG PO TABS
10.0000 mg | ORAL_TABLET | Freq: Every evening | ORAL | Status: DC
  Administered 2020-07-20 – 2020-07-22 (×3): 10 mg via ORAL
  Filled 2020-07-19 (×3): qty 1

## 2020-07-19 MED ORDER — ALBUTEROL SULFATE HFA 108 (90 BASE) MCG/ACT IN AERS
2.0000 | INHALATION_SPRAY | RESPIRATORY_TRACT | Status: DC | PRN
  Filled 2020-07-19: qty 6.7

## 2020-07-19 MED ORDER — ASPIRIN EC 81 MG PO TBEC
81.0000 mg | DELAYED_RELEASE_TABLET | Freq: Every day | ORAL | Status: DC
  Administered 2020-07-19 – 2020-07-23 (×5): 81 mg via ORAL
  Filled 2020-07-19 (×5): qty 1

## 2020-07-19 MED ORDER — ACETAMINOPHEN 325 MG PO TABS
650.0000 mg | ORAL_TABLET | Freq: Four times a day (QID) | ORAL | Status: DC | PRN
  Administered 2020-07-20: 650 mg via ORAL
  Filled 2020-07-19: qty 2

## 2020-07-19 MED ORDER — ACETAMINOPHEN 325 MG PO TABS
650.0000 mg | ORAL_TABLET | Freq: Once | ORAL | Status: AC | PRN
  Administered 2020-07-19: 650 mg via ORAL
  Filled 2020-07-19: qty 2

## 2020-07-19 MED ORDER — MOMETASONE FURO-FORMOTEROL FUM 200-5 MCG/ACT IN AERO
2.0000 | INHALATION_SPRAY | Freq: Two times a day (BID) | RESPIRATORY_TRACT | Status: DC
  Administered 2020-07-20 – 2020-07-23 (×7): 2 via RESPIRATORY_TRACT
  Filled 2020-07-19 (×2): qty 8.8
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
## 2020-07-19 NOTE — Telephone Encounter (Signed)
Pt reporting feeling weak, heart racing, faint, chills x 4 days.

## 2020-07-19 NOTE — Progress Notes (Signed)
Left Lower Ext. study completed.   See CVProc for preliminary results.   Frances Ambrosino, RDMS, RVT 

## 2020-07-19 NOTE — ED Triage Notes (Signed)
Pt bib ems from home with reports of Chest/rib pain, worse with inspiration and coughing. Describes pain as sharp. Hx Copd. Pt also endorses chills. 324mg  asa pta.  154/88 HR 116 ST RR 22 99% RA CBG 177 98.12F

## 2020-07-19 NOTE — H&P (Addendum)
Date: 07/19/2020               Patient Name:  Richard [name] MRN: [id number]  DOB: 09/14/1961 Age / Sex: 59 y.o., male   PCP: [name], DO         Medical Service: Internal Medicine Teaching Service         Attending Physician: Dr. Dareen Piano    First Contact: Dr. Wynetta Emery Pager: [phone]  Second Contact: Dr. Court Joy Pager: [phone]       After Hours (After 5p/  First Contact Pager: [phone]  weekends / holidays): Second Contact Pager: [phone]   Chief Complaint: Chest pain, left leg swelling and pain  History of Present Illness:   Richard [name] is a 59 y.o. year old male with hx of diabetes mellitus with neuropathy, asthma-COPD overlap, hypertension, hyperlipidemia presenting for chest pain and left leg pain + swelling. States the chest pain started on Friday, wasn't too bad on Friday or Saturday, worsened on Sunday, improved on Monday, then worsened today. It is described as throbbing and sharp, shoots down his right arm and notes that his fingers feel numb afterwards. Intermittent and lasts 7-10 seconds, occurs roughly once a minute but sometimes has longer gaps. Currently has significantly improved. Received ASA 39m by EMS, no nitroglycerin. States that he walks a lot due to school, but notices the pain more at rest. Seems worse with coughing.   Has had 2 episodes of near syncope, once yesterday and once today. This occurred while he was walking around the grocery store. He reports feeling very lightheaded and that his heart was beating very quickly. Denies CP other than what is described above, SOB, loss of consciousness, headache. Reports good PO intake, but has had 2x episodes of water diarrhea for the past   He is also complaining of pain to his LLE which started 5 days ago and progressively increased. Did not notice that it was swollen until ED provide pointed it out. Small ulcer noted to the bottom of the affected foot, which he was unaware of. He is diabetic and has  bilateral foot neuropathy with little sensation. Takes lyrica for neuropathic pain. Sees podiatry every 3 months, last on 9/24. Is careful with his feet and wears socks and slippers in the house and diabetic shoes outside. Is not aware of stepping on any sharp objects and feels it is unlikely. Last tetanus shot 1 year ago. Has been having some chills for 2-3 days.   He has history of COPD, which has been better in recent weeks. States that it was worse this year than previously, was very bad over the summer, but has been better since the weather cooled down in the past few weeks. Still requires his albuterol nebulizer 3-4 times a day. Also uses his Singulair.   ED course:  Initially concerned for ACS and for PE. However had negative troponins and unconcerning EKG. LE doppler and CTA without blood clot. Still with fever and white count, admitted for IV antibiotics to treat suspected cellulitis.   Meds:  Current Outpatient Medications  Medication Instructions  . ACCU-CHEK FASTCLIX LANCETS MISC Check blood sugar two times a day  . albuterol (PROAIR HFA) 108 (90 Base) MCG/ACT inhaler 2 puffs, Inhalation, Every 4 hours PRN  . albuterol (PROVENTIL) 2.5 mg, Nebulization, Every 6 hours PRN  . aspirin 81 mg, Oral, Daily  . atorvastatin (LIPITOR) 40 mg, Oral, Daily  . Blood Glucose Monitoring Suppl (ACCU-CHEK GUIDE) w/Device KIT 1  each, Does not apply, 2 times daily  . diclofenac sodium (VOLTAREN) 4 g, Topical, 4 times daily PRN  . DULERA 200-5 MCG/ACT AERO INHALE 2 PUFFS INTO THE LUNGS TWICE DAILY  . Empagliflozin-metFORMIN HCl (SYNJARDY) 02-999 MG TABS 1 tablet, Oral, 2 times daily  . glucose blood (ACCU-CHEK GUIDE) test strip Check blood sugar two times a day  . ibuprofen (ADVIL) 600 mg, Oral, Every 6 hours PRN  . Lancets Misc. (UNISTIK 2 NORMAL) MISC Check blood sugar two times a day  . latanoprost (XALATAN) 0.005 % ophthalmic solution 1 drop, Daily at bedtime  . levocetirizine (XYZAL) 5 mg, Oral,  Every evening  . methocarbamol (ROBAXIN) 1,000 mg, Oral, Every 8 hours PRN  . montelukast (SINGULAIR) 10 mg, Oral, Daily at bedtime  . Multiple Vitamins-Minerals (MULTIVITAMIN ADULT PO) 1 tablet, Oral, Daily  . nystatin (MYCOSTATIN/NYSTOP) powder Apply between 4th and 5th digits of both feet once daily.  . pioglitazone (ACTOS) 30 mg, Oral, Daily  . pregabalin (LYRICA) 100 mg, Oral, 3 times daily  . timolol (TIMOPTIC) 0.5 % ophthalmic solution 1 drop, Both Eyes, 2 times daily  . triamcinolone ointment (KENALOG) 0.1 % 1 application, Topical, 2 times daily PRN  . Trulicity 1.5 mg, Subcutaneous, Weekly  . VYZULTA 0.024 % SOLN 1 drop, Ophthalmic, Daily at bedtime     Allergies: Allergies as of 07/19/2020  . (No Known Allergies)   Past Medical History:  Diagnosis Date  . Asthma   . Chronic bronchitis ([place])   . Clostridium difficile colitis 09/09/2014  . COPD (chronic obstructive pulmonary disease) ([place])   . Eczema   . Essential hypertension   . 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 Hyperlipidemia   . Neuromuscular disorder (HCC)    neuropathy in feet  . 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 Substance abuse ([place])    crack - recovered x 30 yrs  . Type II diabetes mellitus ([place])    diagnosed in 2013    Family History:  Family History  Problem Relation Age of Onset  . Hypertension Mother   . Hypertension Sister   . Glaucoma Sister   . Colon cancer Neg Hx   . Colon polyps Neg Hx   . Esophageal cancer Neg Hx   . Rectal cancer Neg Hx   . Stomach cancer Neg Hx     Social History:  Social History   Tobacco Use  . Smoking status: Former Smoker    Packs/day: 1.00    Years: 45.00    Pack years: 45.00    Types: Cigarettes    Quit date: 01/12/2018    Years since quitting: 2.5  . Smokeless tobacco: Never Used  Vaping Use  . Vaping Use: Never used  Substance Use Topics  . Alcohol use: Not Currently    Alcohol/week: 0.0 standard drinks    Comment: 08/24/2019 "nothing  since <2010"  . Drug use: Not Currently    Types: Cocaine    Comment: 10/23/2018 "nothing since <1990"    Review of Systems: Review of Systems  Constitutional: Positive for chills and fever. Negative for diaphoresis.  HENT: Negative for congestion and sore throat.   Eyes: Positive for blurred vision (while looking at computer screen. hx glaucoma, takes eye drops).  Respiratory: Positive for shortness of breath (baseline). Negative for cough.   Cardiovascular: Positive for chest pain, palpitations and leg swelling.  Gastrointestinal: Positive for diarrhea. Negative for abdominal pain, nausea and vomiting.  Genitourinary: Negative  for dysuria and urgency.  Musculoskeletal: Positive for joint pain (chronic knee pain).  Skin: Positive for itching and rash.  Neurological: Positive for dizziness (lightheadedeness), sensory change and weakness. Negative for loss of consciousness and headaches.     Physical Exam: Physical Exam Vitals and nursing note reviewed.  Constitutional:      Appearance: He is well-developed.  HENT:     Head: Normocephalic and atraumatic.  Eyes:     Extraocular Movements: Extraocular movements intact.  Cardiovascular:     Rate and Rhythm: Normal rate and regular rhythm.     Heart sounds: Normal heart sounds.  Pulmonary:     Effort: Pulmonary effort is normal. No tachypnea, accessory muscle usage or respiratory distress.     Breath sounds: Normal breath sounds.  Abdominal:     General: Bowel sounds are normal.     Palpations: Abdomen is soft.     Tenderness: There is no abdominal tenderness. There is no rebound.  Musculoskeletal:        General: Normal range of motion.     Cervical back: Normal range of motion and neck supple.     Right lower leg: No tenderness. No edema.     Left lower leg: Tenderness present. Edema (nonpitting) present.     Comments: LLE swollen, erythematous, and moderately tender over dorsal foot, ankle, and lower leg No calf  tenderness Small wound to bottom of L foot No purulence Large callous present on bottom of R foot Toenails with onychomycosis  Skin:    General: Skin is warm and dry.     Findings: Erythema and rash (rash to bilateral upper arms and shoulders with excoriation) present.  Neurological:     General: No focal deficit present.     Mental Status: He is alert and oriented to person, place, and time.  Psychiatric:        Mood and Affect: Mood normal.        Behavior: Behavior normal.            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 year old male with hx of  hx of diabetes mellitus with neuropathy, asthma-COPD overlap, hypertension, hyperlipidemia presenting for chest pain, left leg swelling and pain. Negative troponins, LE doppler, and CTA. Treating cellulitis with Abx.  Cellulitis of LLE with fever Diabetic foot ulcer Temperature of 100.5 x2 on admission, sinus tachycardia which has improved. WBC of 12.3. Endorsed shaking chills over the past 3 days at home. On exam has very tender, erythematous, and edematous L foot and lower leg.  Noted to have a non-draining, small ulcer on bottom of L foot. Has good outpatient follow up with podiatry every 3 months, last seen on 9/24. At that time they noted the callous on his R foot and a "hemorrhagic callous" but unclear from verbage if on R of L. So the duration of the current ulcer is unclear. Also appears as if it could have been a puncture wound, though the patient does not believe he stepped on  anything. Tetanus shot is up to date. From appearance, suspect  that this is the entry point of his cellulitis without deeper infection. -continue Ancef -f/u blood cultures -xray of L foot is benign, noted calcifications consistent with vascular disease     -had ABI in 2019 of 1.12 on the R and 1.35 on the L -osteomyelitis unlikely with size and appearance of ulcer. Ordering ESR, consider imaging if >70 -outpatient podiatry follow up  Chest pain - improving HLD Had a history of chest pain in 2016 and had a cath with benign findings, 10% stenosis of 2 vessels. No prior MI. On ASA and Lipitor for primary prevention. Troponin 8 > 6. Negative LE doppler and CTA. Received ASA 325m by EMS, no nitro. Reports CP is improving. Likely secondary to his ongoing infection. -telemetry -continue home ASA 845mand Lipitor 4091mNear syncope Endorses severe lightheadedness, generalized weakness, and tachycardia. Most likely secondary to infection, diarrhea, and dehydration. Could also have been hypoglycemic. Given timeline of symptoms and otherwise benign cardiac workup and history, will hold off on echo for now. -telemetry   DM type II with peripheral neuropathy Last A1c of 7.6 on 02/18/20. On synjardy, pioglitazone, Trulicity, and lyrica at home. -SSI -f/u A1c -continue Lyrica  Asthma-COPD overlap syndrome On Singulair, Xyzal, albuterol inhaler, and albuterol nebulizer at home. Reports improvement in recent weeks, but still using albuterol 3-4 times daily. -albuterol prn -continue singulair -would benefit from increase in control medications prior to discharge  Essential HTN No current BP meds at home besides empagliflozin. Normotensive so far this admission. -monitor since not on empagliflozin here  Dispo: Admit patient to Observation with expected length of stay less than 2 midnights.  Signed: CheAndrew AuD 07/19/2020, 11:08 PM  Pager: [phone] 5pm on weekdays and 1pm on  weekends: On Call pager: [phone]

## 2020-07-19 NOTE — Progress Notes (Signed)
ER RN called and gave report. His room is ready.

## 2020-07-19 NOTE — Telephone Encounter (Signed)
RTC, Patient answered and immediately handed phone to a paramedic who was in the patient's home.  The paramedic informed RN that they were with patient now because they received a 911 call from patient with c/o chest pain.  RN informed paramedic [facility] recommends anyone with active chest pain be evaluated in ED, he states "Yep, we got him". SChaplin, RN,BSN

## 2020-07-20 ENCOUNTER — Observation Stay (HOSPITAL_COMMUNITY): Payer: Medicare Other

## 2020-07-20 ENCOUNTER — Encounter (HOSPITAL_COMMUNITY): Payer: Self-pay | Admitting: Internal Medicine

## 2020-07-20 ENCOUNTER — Other Ambulatory Visit: Payer: Self-pay

## 2020-07-20 DIAGNOSIS — Z7984 Long term (current) use of oral hypoglycemic drugs: Secondary | ICD-10-CM

## 2020-07-20 DIAGNOSIS — R600 Localized edema: Secondary | ICD-10-CM | POA: Diagnosis not present

## 2020-07-20 DIAGNOSIS — E1140 Type 2 diabetes mellitus with diabetic neuropathy, unspecified: Secondary | ICD-10-CM | POA: Diagnosis not present

## 2020-07-20 DIAGNOSIS — I10 Essential (primary) hypertension: Secondary | ICD-10-CM

## 2020-07-20 DIAGNOSIS — E785 Hyperlipidemia, unspecified: Secondary | ICD-10-CM

## 2020-07-20 DIAGNOSIS — J449 Chronic obstructive pulmonary disease, unspecified: Secondary | ICD-10-CM

## 2020-07-20 DIAGNOSIS — E11628 Type 2 diabetes mellitus with other skin complications: Principal | ICD-10-CM

## 2020-07-20 DIAGNOSIS — L03116 Cellulitis of left lower limb: Secondary | ICD-10-CM | POA: Diagnosis not present

## 2020-07-20 DIAGNOSIS — Z7951 Long term (current) use of inhaled steroids: Secondary | ICD-10-CM

## 2020-07-20 LAB — BASIC METABOLIC PANEL
Anion gap: 10 (ref 5–15)
BUN: 12 mg/dL (ref 8–23)
CO2: 24 mmol/L (ref 22–32)
Calcium: 8.7 mg/dL — ABNORMAL LOW (ref 8.9–10.3)
Chloride: 102 mmol/L (ref 98–111)
Creatinine, Ser: 1.16 mg/dL (ref 0.61–1.24)
GFR, Estimated: >60 mL/min (ref >60)
Glucose, Bld: 197 mg/dL — ABNORMAL HIGH (ref 70–99)
Potassium: 3.8 mmol/L (ref 3.5–5.1)
Sodium: 136 mmol/L (ref 135–145)

## 2020-07-20 LAB — CBC
HCT: 34.7 % — ABNORMAL LOW (ref 39.0–52.0)
Hemoglobin: 10.9 g/dL — ABNORMAL LOW (ref 13.0–17.0)
MCH: 26.1 pg (ref 26.0–34.0)
MCHC: 31.4 g/dL (ref 30.0–36.0)
MCV: 83 fL (ref 80.0–100.0)
Platelets: 228 10*3/uL (ref 150–400)
RBC: 4.18 MIL/uL — ABNORMAL LOW (ref 4.22–5.81)
RDW: 14.1 % (ref 11.5–15.5)
WBC: 9.3 10*3/uL (ref 4.0–10.5)
nRBC: 0 % (ref 0.0–0.2)

## 2020-07-20 LAB — SEDIMENTATION RATE: Sed Rate: 50 mm/hr — ABNORMAL HIGH (ref 0–16)

## 2020-07-20 LAB — GLUCOSE, CAPILLARY
Glucose-Capillary: 133 mg/dL — ABNORMAL HIGH (ref 70–99)
Glucose-Capillary: 154 mg/dL — ABNORMAL HIGH (ref 70–99)
Glucose-Capillary: 159 mg/dL — ABNORMAL HIGH (ref 70–99)
Glucose-Capillary: 170 mg/dL — ABNORMAL HIGH (ref 70–99)
Glucose-Capillary: 177 mg/dL — ABNORMAL HIGH (ref 70–99)

## 2020-07-20 LAB — LACTIC ACID, PLASMA: Lactic Acid, Venous: 1.1 mmol/L (ref 0.5–1.9)

## 2020-07-20 LAB — HEMOGLOBIN A1C
Hgb A1c MFr Bld: 7.2 % — ABNORMAL HIGH (ref 4.8–5.6)
Mean Plasma Glucose: 159.94 mg/dL

## 2020-07-20 LAB — HIV ANTIBODY (ROUTINE TESTING W REFLEX): HIV Screen 4th Generation wRfx: NONREACTIVE

## 2020-07-20 MED ORDER — ACETAMINOPHEN 650 MG RE SUPP: 650.0000 mg | RECTAL | Status: AC | PRN

## 2020-07-20 MED ORDER — IPRATROPIUM-ALBUTEROL 0.5-2.5 (3) MG/3ML IN SOLN
3.0000 mL | Freq: Two times a day (BID) | RESPIRATORY_TRACT | Status: DC
  Administered 2020-07-20 – 2020-07-23 (×6): 3 mL via RESPIRATORY_TRACT
  Filled 2020-07-20 (×6): qty 3

## 2020-07-20 MED ORDER — ALBUTEROL SULFATE (2.5 MG/3ML) 0.083% IN NEBU
2.5000 mg | INHALATION_SOLUTION | RESPIRATORY_TRACT | Status: DC | PRN
  Administered 2020-07-20 – 2020-07-22 (×3): 2.5 mg via RESPIRATORY_TRACT
  Filled 2020-07-20 (×4): qty 3

## 2020-07-20 MED ORDER — IBUPROFEN 600 MG PO TABS
600.0000 mg | ORAL_TABLET | Freq: Four times a day (QID) | ORAL | Status: DC
  Administered 2020-07-20 (×3): 600 mg via ORAL
  Filled 2020-07-20 (×2): qty 1

## 2020-07-20 MED ORDER — ACETAMINOPHEN 325 MG PO TABS
650.0000 mg | ORAL_TABLET | ORAL | Status: AC | PRN
  Administered 2020-07-21 (×2): 650 mg via ORAL
  Filled 2020-07-20 (×2): qty 2

## 2020-07-20 MED ORDER — IBUPROFEN 600 MG PO TABS
600.0000 mg | ORAL_TABLET | ORAL | Status: DC
  Filled 2020-07-20: qty 1

## 2020-07-20 NOTE — Evaluation (Signed)
Physical Therapy Evaluation Patient Details Name: Richard [name] MRN: [id number] DOB: 11-24-1960 Today's Date: 07/20/2020   History of Present Illness  Pt is a 59 y/o male with PMH of COPD, HTN, DM, neuropathy, glaucoma, substance abuse, presenting to ED due to chest pain. Reports initally experiencing near syncopal episodes, central chest pain/SOB that radiates down his R arm, and increased L foot pain. Negative troponins and unconcerning EKG, LE doppler and CTA negative. Found with cellulitis of L LE and diabetic foot ulcer.   Clinical Impression  Pt was I prior to admission but is now limited by pain and overall poor endurance; at this time pt is unable to fully participate in session due to pain in B feet; pt is motivated to get up and perform functional mobility tasks when pain improves, RN notified; pt will benefit from skilled PT to address deficits in balance, strength, coordination, gait and endurance to return to PLOF prior to discharge.     Follow Up Recommendations No PT follow up;Other (comment) (anticipate no follow up PT needed pending progress with therapy)    Equipment Recommendations  None recommended by PT    Recommendations for Other Services       Precautions / Restrictions Precautions Precautions: Fall Restrictions Weight Bearing Restrictions: No      Mobility  Bed Mobility Overal bed mobility: Modified Independent Bed Mobility: Supine to Sit;Sit to Supine     Supine to sit: Modified independent (Device/Increase time) Sit to supine: Modified independent (Device/Increase time)      Transfers Overall transfer level: Needs assistance               General transfer comment: pt performed lateral scoots on EOB to improve alignment/positioning in the bed; pt requiring S; pt very fatigued follow minimal movement stating he felt very weak after  Ambulation/Gait                Stairs            Wheelchair Mobility    Modified Rankin  (Stroke Patients Only)       Balance Overall balance assessment: Needs assistance Sitting-balance support: No upper extremity supported;Feet supported Sitting balance-Leahy Scale: Good                                       Pertinent Vitals/Pain Pain Location: pt states both feet constant are in discomfort but is tolerable    Home Living Family/patient expects to be discharged to:: Private residence Living Arrangements: Other (Comment) (granddaughter) Available Help at Discharge: Family;Available 24 hours/day Type of Home:  (townhome) Home Access: Stairs to enter Entrance Stairs-Rails: Psychiatric nurse of Steps: 3-4 Home Layout: Two level;1/2 bath on main level;Bed/bath upstairs Home Equipment: Grab bars - tub/shower      Prior Function Level of Independence: Independent         Comments: on disability, no driving; independent iADLs      Hand Dominance   Dominant Hand: Left    Extremity/Trunk Assessment   Upper Extremity Assessment Upper Extremity Assessment: Defer to OT evaluation    Lower Extremity Assessment Lower Extremity Assessment: Overall WFL for tasks assessed (pt on bestrest with bathroom privledges; pt demonstrated enought strength to be I with bed mobility supine<>sit and scooting up in bed to improve postitioning/alignment to improve breathing)       Communication   Communication: No difficulties  Cognition Arousal/Alertness: Awake/alert Behavior  During Therapy: WFL for tasks assessed/performed Overall Cognitive Status: Within Functional Limits for tasks assessed                                        General Comments General comments (skin integrity, edema, etc.): pt demonstrating deficits breathing during session, respiratoy present and provided breathing treatment, pt's breathing improved; sitting EOB performed scapular retraction with deep breathing to improve posture strength and improve deep  breathing    Exercises     Assessment/Plan    PT Assessment Patient needs continued PT services  PT Problem List Decreased mobility;Decreased balance;Decreased activity tolerance       PT Treatment Interventions Therapeutic exercise;Gait training;Balance training;Stair training;Functional mobility training;Therapeutic activities    PT Goals (Current goals can be found in the Care Plan section)  Acute Rehab PT Goals Patient Stated Goal: to get back to where i was before PT Goal Formulation: With patient Time For Goal Achievement: 08/03/20 Potential to Achieve Goals: Good    Frequency Min 3X/week   Barriers to discharge        Co-evaluation               AM-PAC PT "6 Clicks" Mobility  Outcome Measure Help needed turning from your back to your side while in a flat bed without using bedrails?: None Help needed moving from lying on your back to sitting on the side of a flat bed without using bedrails?: None Help needed moving to and from a bed to a chair (including a wheelchair)?: A Little Help needed standing up from a chair using your arms (e.g., wheelchair or bedside chair)?: A Little Help needed to walk in hospital room?: A Little Help needed climbing 3-5 steps with a railing? : A Little 6 Click Score: 20    End of Session   Activity Tolerance: Patient limited by fatigue Patient left: in bed;with bed alarm set;with call bell/phone within reach Nurse Communication: Mobility status PT Visit Diagnosis: Other abnormalities of gait and mobility (R26.89);Other (comment) (poor activity tolerance)    Time: 7425-9563 PT Time Calculation (min) (ACUTE ONLY): 22 min   Charges:   PT Evaluation $PT Eval Low Complexity: [place], DPT Acute Rehabilitation Services [phone]  [place] 07/20/2020, 12:22 PM

## 2020-07-20 NOTE — Evaluation (Signed)
Occupational Therapy Evaluation Patient Details Name: Richard [name] MRN: [id number] DOB: Sep 24, 1961 Today's Date: 07/20/2020    History of Present Illness Pt is a 59 y/o male with PMH of COPD, HTN, DM, neuropathy, glaucoma, substance abuse, presenting to ED due to chest pain. Reports initally experiencing near syncopal episodes, central chest pain/SOB that radiates down his R arm, and increased L foot pain. Negative troponins and unconcerning EKG, LE doppler and CTA negative. Found with cellulitis of L LE and diabetic foot ulcer.    Clinical Impression   PTA patient independent with mobility, ADLs and IADLs.  Reports granddaughter lives with him and is able to help as needed.  Patient admitted for above and limited by problem list below, including pain to L LE, impaired balance, decreased weightbearing through L LE and decreased activity tolerance.  OT eval to initate bathroom mobility, patient completing bed mobility with supervision, sit to stand with min guard using RW, ADLs with min-supervision; unable to progress mobility to bathroom due to difficulty weightbearing on L LE/ pain 10/10. RN notified of pain. Based on performance today, believe patient will benefit from continued OT services while admitted to optimize independence and safety.  Anticipate once pain is better controlled, he will progress well with no further needs required after dc home.  Will follow acutely and update dc plan as needed.     Follow Up Recommendations  No OT follow up;Supervision - Intermittent    Equipment Recommendations  3 in 1 bedside commode;Other (comment) (RW)    Recommendations for Other Services       Precautions / Restrictions Precautions Precautions: Fall Restrictions Weight Bearing Restrictions: No      Mobility Bed Mobility Overal bed mobility: Needs Assistance Bed Mobility: Supine to Sit;Sit to Supine     Supine to sit: Supervision Sit to supine: Supervision   General bed mobility  comments: increased time and effort, no phyiscal assist required  Transfers Overall transfer level: Needs assistance Equipment used: Rolling walker (2 wheeled) Transfers: Sit to/from Stand Sit to Stand: Min guard         General transfer comment: for safety/balance, utilized RW due to decreased weightbearing through L LE     Balance Overall balance assessment: Needs assistance Sitting-balance support: No upper extremity supported;Feet supported Sitting balance-Leahy Scale: Good     Standing balance support: Bilateral upper extremity supported;During functional activity;No upper extremity supported Standing balance-Leahy Scale: Fair Standing balance comment: able to stand statically without UE support but preference to UE support due to L LE pain                           ADL either performed or assessed with clinical judgement   ADL Overall ADL's : Needs assistance/impaired     Grooming: Set up;Sitting   Upper Body Bathing: Set up;Sitting   Lower Body Bathing: Sit to/from stand;Minimal assistance   Upper Body Dressing : Supervision/safety;Sitting   Lower Body Dressing: Sit to/from stand;Minimal assistance Lower Body Dressing Details (indicate cue type and reason): requires assist for L sock, min assist in standing (min guard sit to stand )   Toilet Transfer Details (indicate cue type and reason): deferred d/t L foot pain         Functional mobility during ADLs: Min guard;Rolling walker General ADL Comments: pt limited by L foot pain and decreased weightbearing through LE      Vision   Vision Assessment?: No apparent visual deficits  Perception     Praxis      Pertinent Vitals/Pain Pain Assessment: 0-10 Pain Score: 10-Worst pain ever Pain Location: L foot Pain Descriptors / Indicators: Discomfort;Burning;Throbbing Pain Intervention(s): Limited activity within patient's tolerance;Monitored during session;Repositioned     Hand Dominance  Left   Extremity/Trunk Assessment Upper Extremity Assessment Upper Extremity Assessment: Overall WFL for tasks assessed   Lower Extremity Assessment Lower Extremity Assessment: Defer to PT evaluation (limited ROM through L ankle and weightbearing to L LE )   Cervical / Trunk Assessment Cervical / Trunk Assessment: Normal   Communication Communication Communication: No difficulties   Cognition Arousal/Alertness: Awake/alert Behavior During Therapy: WFL for tasks assessed/performed Overall Cognitive Status: Within Functional Limits for tasks assessed                                     General Comments  pt able to place L LE on floor seated EOB once sock donned, pt keeps off floor in standing; educated on elevation of L LE     Exercises     Shoulder Instructions      Home Living Family/patient expects to be discharged to:: Private residence Living Arrangements: Other (Comment) (granddaughter) Available Help at Discharge: Family;Available 24 hours/day (granddaughter) Type of Home: Other(Comment) (townhouse ) Home Access: Stairs to enter [employer] of Steps: 3-4 Entrance Stairs-Rails: Right;Left Home Layout: Two level;1/2 bath on main level;Bed/bath upstairs Alternate Level Stairs-Number of Steps: flight  Alternate Level Stairs-Rails: Right Bathroom Shower/Tub: Teacher, early years/pre: Standard     Home Equipment: Grab bars - tub/shower          Prior Functioning/Environment Level of Independence: Independent        Comments: on disability, no driving; independent iADLs         OT Problem List: Decreased strength;Decreased activity tolerance;Impaired balance (sitting and/or standing);Pain;Decreased knowledge of precautions;Decreased knowledge of use of DME or AE;Increased edema      OT Treatment/Interventions: Self-care/ADL training;Energy conservation;DME and/or AE instruction;Therapeutic activities;Patient/family  education;Balance training    OT Goals(Current goals can be found in the care plan section) Acute Rehab OT Goals Patient Stated Goal: less pain  OT Goal Formulation: With patient Time For Goal Achievement: 08/03/20 Potential to Achieve Goals: Good  OT Frequency: Min 2X/week   Barriers to D/C:            Co-evaluation              AM-PAC OT "6 Clicks" Daily Activity     Outcome Measure Help from another person eating meals?: None Help from another person taking care of personal grooming?: A Little Help from another person toileting, which includes using toliet, bedpan, or urinal?: A Little Help from another person bathing (including washing, rinsing, drying)?: A Little Help from another person to put on and taking off regular upper body clothing?: None Help from another person to put on and taking off regular lower body clothing?: A Little 6 Click Score: 20   End of Session Equipment Utilized During Treatment: Rolling walker Nurse Communication: Mobility status  Activity Tolerance: Patient tolerated treatment well Patient left: with call bell/phone within reach;in bed;with bed alarm set  OT Visit Diagnosis: Other abnormalities of gait and mobility (R26.89);Pain Pain - Right/Left: Left Pain - part of body: Ankle and joints of foot                Time: [phone]  OT Time Calculation (min): 18 min Charges:  OT General Charges $OT Visit: 1 Visit OT Evaluation $OT Eval Low Complexity: 1 Low  Jolaine Artist, OT Acute Rehabilitation Services Pager [phone] Office [phone]   [name] 07/20/2020, 9:20 AM

## 2020-07-20 NOTE — Consult Note (Signed)
WOC Nurse Consult Note: Reason for Consult: Consulted for plantar aspect of left foot, 5th metatarsal head with lesion, surrounding soft tissue involvement. Patient reports pain in previously neuropathic foot indicative of infection.  Followed by Podiatry (Dr. Maryelizabeth Rowan), last seen on 07/01/20. Wound type: Infectious Pressure Injury POA: N/A Measurement: 2cm x 1.6cm area of white discoloration beneath the skin with 0.4cm round lesion in center Wound bed: dry Drainage (amount, consistency, odor) small amount of serosanguinous on old dressing Periwound: As described above Dressing procedure/placement/frequency: I will provide topical care guidance for the care of this wound  and surrounding area using an antimicrobial nonadherent dressing (xeroform) and will simultaneous recommend consultation with Orthopedic MD and further radiographic diagnostics to rule out infection of bone or skin.    [facility] nursing team will not follow, but will remain available to this patient, the nursing and medical teams.  Please re-consult if needed. Thanks, Maudie Flakes, MSN, RN, [place], Arther Abbott  Pager# [phone]

## 2020-07-20 NOTE — Progress Notes (Signed)
Subjective:  Richard [name] reports that he is having pain in his left foot. He describes the pain as a burning sensation and states that it has worsened since his arrival yesterday. He denies any chest pain, or shortness of breath. He would also like to advance his diet. He does not have any other concerns for today.   Objective:  Vital signs in last 24 hours: Vitals:   07/20/20 0021 07/20/20 0507 07/20/20 0600 07/20/20 0928  BP: 126/61 131/72  121/64  Pulse: 91 92  91  Resp: 18     Temp: 99.1 F (37.3 C)  99.5 F (37.5 C) 98.4 F (36.9 C)  TempSrc: Oral  Oral Oral  SpO2: 98% 97%  96%  Weight: 105.7 kg     Height:       Weight change:   Intake/Output Summary (Last 24 hours) at 07/20/2020 1013 Last data filed at 07/20/2020 0800 Gross per 24 hour  Intake 370 ml  Output 600 ml  Net -230 ml   Physical Exam Constitutional:      General: He is not in acute distress.    Appearance: He is not ill-appearing.     Comments: Sitting in bed, conversant  Cardiovascular:     Rate and Rhythm: Normal rate and regular rhythm.     Heart sounds: Normal heart sounds.  Pulmonary:     Effort: Pulmonary effort is normal. No respiratory distress.     Breath sounds: Normal breath sounds.  Musculoskeletal:     Left lower leg: Tenderness present. Edema present.       Feet:     Comments: Tenderness to Light Palpation of LLE with increased  warmth compared to right. No erythema.   Feet:     Left foot:     Skin integrity: Ulcer and warmth present. No erythema.     Comments: Decreased plantar sensation.Tenderness to light palpation of dorsal surface of L. Foot.  Skin:    General: Skin is warm and dry.  Neurological:     Mental Status: He is alert and oriented to person, place, and time.      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    Cellulitis of LLE 2/2 diabetic foot ulcer Patient presented with erythema, tenderness, and edema of left lower extremity, as well as fever of 100.5 and endorsed chills. Noted to have a non-draining ulcer on the plantar surface of the left foot. He does not recall any injury, but does have diabetic neuropathy so an unnoticed puncture injury is possible. Xray was not significant for bony abnormality. We will get an MRI to evaluate for deeper soft tissue infection. He is day 2 of 5-7 day course Ancef. He currently afebrile and his leukocytosis is resolved. We will continue antibiotics and follow up on blood cultures/imaging.  --continue Cefazolin  -- f/u blood cultures --MRI of LLE  Diabetes Mellitus Type 2 A1c 7.2 (07/20/20) improved from 7.6 (05/21). CBG is 170  this morning. Taks Empagoflozin-Metformin 5-1000mg  BID, Pioglitazone 30mg  daily and Dulaglutide injection weekly. We are currently holding home meds and he is on Sliding Scale Insulin during admission and closely monitoring glucose with current infection. --SSI --CBG monitoring   Diabetic Neuropathy  He reports increased burning pain this morning, this is mostly likely due to his current infection, but neuropathic pain is possible. On exam he has decreased sensation of plantar surface. Currently take pregabalin 100mg  TID, and we will continue current regimen, and add scheduled Ibuprofen and Acetaminophen for pain. He sees Podiatry, Dr. Maryelizabeth Rowan, and will need outpatient follow up. --continue Pregabablin 100mg  TID --PO Ibuprofen  600mg  Q6H --PO Acetaminophen 650mg  Q4H --wound dressing with antimicrobial dressing (Xeroform)  Hyperlipidemia Home meds include Atorvastatin 40mg  and Aspirin 81mg . We will continue during admission. --continue Atorvastatin 40mg  daily  --continue ASA 81mg  daily  Hypertension No home meds. BP has been normotensive during admission.   Asthma/COPD Takes Montelukast 10mg ,  Levocetirizine 5mg , and albuterol nebulizer. Reports having to use albuterol 3-4 times. We will continue Montelukast and albuterol. --Montelukast 10mg  at bedtime --Albuterol prn   LOS: 0 days   Jacklynn Bue, Medical Student 07/20/2020, 10:13 AM

## 2020-07-21 DIAGNOSIS — E785 Hyperlipidemia, unspecified: Secondary | ICD-10-CM | POA: Diagnosis present

## 2020-07-21 DIAGNOSIS — L97529 Non-pressure chronic ulcer of other part of left foot with unspecified severity: Secondary | ICD-10-CM | POA: Diagnosis present

## 2020-07-21 DIAGNOSIS — Z87891 Personal history of nicotine dependence: Secondary | ICD-10-CM | POA: Diagnosis not present

## 2020-07-21 DIAGNOSIS — E11621 Type 2 diabetes mellitus with foot ulcer: Secondary | ICD-10-CM | POA: Diagnosis present

## 2020-07-21 DIAGNOSIS — E11628 Type 2 diabetes mellitus with other skin complications: Secondary | ICD-10-CM | POA: Diagnosis present

## 2020-07-21 DIAGNOSIS — Z8249 Family history of ischemic heart disease and other diseases of the circulatory system: Secondary | ICD-10-CM | POA: Diagnosis not present

## 2020-07-21 DIAGNOSIS — Z7984 Long term (current) use of oral hypoglycemic drugs: Secondary | ICD-10-CM | POA: Diagnosis not present

## 2020-07-21 DIAGNOSIS — E1139 Type 2 diabetes mellitus with other diabetic ophthalmic complication: Secondary | ICD-10-CM | POA: Diagnosis present

## 2020-07-21 DIAGNOSIS — E1140 Type 2 diabetes mellitus with diabetic neuropathy, unspecified: Secondary | ICD-10-CM | POA: Diagnosis not present

## 2020-07-21 DIAGNOSIS — J449 Chronic obstructive pulmonary disease, unspecified: Secondary | ICD-10-CM | POA: Diagnosis present

## 2020-07-21 DIAGNOSIS — E1141 Type 2 diabetes mellitus with diabetic mononeuropathy: Secondary | ICD-10-CM | POA: Diagnosis present

## 2020-07-21 DIAGNOSIS — Z83511 Family history of glaucoma: Secondary | ICD-10-CM | POA: Diagnosis not present

## 2020-07-21 DIAGNOSIS — Z791 Long term (current) use of non-steroidal anti-inflammatories (NSAID): Secondary | ICD-10-CM | POA: Diagnosis not present

## 2020-07-21 DIAGNOSIS — H409 Unspecified glaucoma: Secondary | ICD-10-CM | POA: Diagnosis present

## 2020-07-21 DIAGNOSIS — R600 Localized edema: Secondary | ICD-10-CM | POA: Diagnosis not present

## 2020-07-21 DIAGNOSIS — I10 Essential (primary) hypertension: Secondary | ICD-10-CM | POA: Diagnosis present

## 2020-07-21 DIAGNOSIS — Z7982 Long term (current) use of aspirin: Secondary | ICD-10-CM | POA: Diagnosis not present

## 2020-07-21 DIAGNOSIS — R079 Chest pain, unspecified: Secondary | ICD-10-CM | POA: Diagnosis present

## 2020-07-21 DIAGNOSIS — Z20822 Contact with and (suspected) exposure to covid-19: Secondary | ICD-10-CM | POA: Diagnosis present

## 2020-07-21 DIAGNOSIS — Z79899 Other long term (current) drug therapy: Secondary | ICD-10-CM | POA: Diagnosis not present

## 2020-07-21 DIAGNOSIS — L03116 Cellulitis of left lower limb: Secondary | ICD-10-CM | POA: Diagnosis present

## 2020-07-21 LAB — CBC
HCT: 37.2 % — ABNORMAL LOW (ref 39.0–52.0)
Hemoglobin: 11.7 g/dL — ABNORMAL LOW (ref 13.0–17.0)
MCH: 26.1 pg (ref 26.0–34.0)
MCHC: 31.5 g/dL (ref 30.0–36.0)
MCV: 83 fL (ref 80.0–100.0)
Platelets: 256 10*3/uL (ref 150–400)
RBC: 4.48 MIL/uL (ref 4.22–5.81)
RDW: 14 % (ref 11.5–15.5)
WBC: 7.4 10*3/uL (ref 4.0–10.5)
nRBC: 0 % (ref 0.0–0.2)

## 2020-07-21 LAB — BASIC METABOLIC PANEL
Anion gap: 8 (ref 5–15)
BUN: 12 mg/dL (ref 8–23)
CO2: 24 mmol/L (ref 22–32)
Calcium: 8.6 mg/dL — ABNORMAL LOW (ref 8.9–10.3)
Chloride: 106 mmol/L (ref 98–111)
Creatinine, Ser: 0.82 mg/dL (ref 0.61–1.24)
GFR, Estimated: >60 mL/min (ref >60)
Glucose, Bld: 144 mg/dL — ABNORMAL HIGH (ref 70–99)
Potassium: 4.5 mmol/L (ref 3.5–5.1)
Sodium: 138 mmol/L (ref 135–145)

## 2020-07-21 LAB — GLUCOSE, CAPILLARY
Glucose-Capillary: 178 mg/dL — ABNORMAL HIGH (ref 70–99)
Glucose-Capillary: 157 mg/dL — ABNORMAL HIGH (ref 70–99)
Glucose-Capillary: 145 mg/dL — ABNORMAL HIGH (ref 70–99)
Glucose-Capillary: 183 mg/dL — ABNORMAL HIGH (ref 70–99)

## 2020-07-21 MED ORDER — IBUPROFEN 600 MG PO TABS
600.0000 mg | ORAL_TABLET | Freq: Four times a day (QID) | ORAL | Status: DC
  Administered 2020-07-21: 600 mg via ORAL
  Filled 2020-07-21: qty 1

## 2020-07-21 MED ORDER — IBUPROFEN 600 MG PO TABS: 600.0000 mg | ORAL_TABLET | Freq: Four times a day (QID) | ORAL | Status: DC | PRN

## 2020-07-21 MED ORDER — IBUPROFEN 600 MG PO TABS
600.0000 mg | ORAL_TABLET | Freq: Four times a day (QID) | ORAL | Status: DC | PRN
  Administered 2020-07-21 – 2020-07-23 (×4): 600 mg via ORAL
  Filled 2020-07-21 (×4): qty 1

## 2020-07-21 NOTE — Progress Notes (Signed)
Subjective:  Mr. Caperton is a 59 year old male with past medical history of T2DM with neuropathy, HTN, HLD, Asthma/COPD who presented to [facility] after a near syncopal epidose with chest pain, left leg swelling and pain found to have left lower extremity cellulitis.  Overnight, no acute events.  This morning, he reports that he feels much better. He states that the pain in his leg is improved today. He denies continuation of chest pain. He reports that he feels a little short of breah, however his inhalers have been helping him with his breathing. He is appreciative of his care and looks forward to going home soon.  Objective:  Vital signs in last 24 hours: Vitals:   07/20/20 2122 07/21/20 0532 07/21/20 0819 07/21/20 1000  BP: 122/71 134/83  135/80  Pulse: 85 82 87 88  Resp: 17 18 16 18   Temp: 98.9 F (37.2 C) 97.8 F (36.6 C)  98 F (36.7 C)  TempSrc: Oral Oral  Oral  SpO2: 95% 93% 95% 96%  Weight: 105.7 kg     Height:      SpO2: 96 %  Filed Weights   07/19/20 1647 07/20/20 0021 07/20/20 2122  Weight: 106.6 kg 105.7 kg 105.7 kg    Intake/Output Summary (Last 24 hours) at 07/21/2020 1352 Last data filed at 07/21/2020 0846 Gross per 24 hour  Intake 890 ml  Output 1050 ml  Net -160 ml   Physical Exam Vitals and nursing note reviewed.  Constitutional:      Appearance: He is well-developed.  HENT:     Head: Normocephalic and atraumatic.  Cardiovascular:     Rate and Rhythm: Normal rate and regular rhythm.     Heart sounds: Normal heart sounds.  Pulmonary:     Effort: Pulmonary effort is normal. No respiratory distress.     Breath sounds: Wheezing present.  Abdominal:     General: Bowel sounds are normal.     Palpations: Abdomen is soft.     Tenderness: There is no abdominal tenderness.  Musculoskeletal:        General: Normal range of motion.     Cervical back: Normal range of motion and neck supple.     Left lower leg: Tenderness present.     Comments: Left lower  extremity wrapped  Skin:    General: Skin is warm and dry.     Capillary Refill: Capillary refill takes less than 2 seconds.  Neurological:     General: No focal deficit present.     Mental Status: He is alert and oriented to person, place, and time.  Psychiatric:        Mood and Affect: Mood normal.        Behavior: Behavior normal.    CBC Latest Ref Rng & Units 07/21/2020 07/20/2020 07/19/2020  WBC 4.0 - 10.5 K/uL 7.4 9.3 12.3(H)  Hemoglobin 13.0 - 17.0 g/dL 11.7(L) 10.9(L) 13.0  Hematocrit 39 - 52 % 37.2(L) 34.7(L) 41.5  Platelets 150 - 400 K/uL 256 228 275   CMP Latest Ref Rng & Units 07/21/2020 07/20/2020 07/19/2020  Glucose 70 - 99 mg/dL 144(H) 197(H) 175(H)  BUN 8 - 23 mg/dL 12 12 14   Creatinine 0.61 - 1.24 mg/dL 0.82 1.16 1.15  Sodium 135 - 145 mmol/L 138 136 133(L)  Potassium 3.5 - 5.1 mmol/L 4.5 3.8 4.6  Chloride 98 - 111 mmol/L 106 102 100  CO2 22 - 32 mmol/L 24 24 22   Calcium 8.9 - 10.3 mg/dL 8.6(L) 8.7(L) 9.2  Total Protein 6.5 - 8.1 g/dL - - -  Total Bilirubin 0.3 - 1.2 mg/dL - - -  Alkaline Phos 38 - 126 U/L - - -  AST 15 - 41 U/L - - -  ALT 17 - 63 U/L - - -   Blood cultures - NGTD for 2 days   DG Chest 2 View  Result Date: 07/19/2020 CLINICAL DATA:  59 year old male with chest pain. EXAM: CHEST - 2 VIEW COMPARISON:  Chest radiograph dated 10/23/2018. FINDINGS: Mild chronic interstitial coarsening and bronchitic changes. No focal consolidation, pleural effusion, or pneumothorax. The cardiac silhouette is within limits. No acute osseous pathology. IMPRESSION: No active cardiopulmonary disease. Electronically Signed   By: Anner Crete M.D.   On: 07/19/2020 17:14   CT Angio Chest PE W and/or Wo Contrast  Result Date: 07/19/2020 CLINICAL DATA:  PE suspected, high probability EXAM: CT ANGIOGRAPHY CHEST WITH CONTRAST TECHNIQUE: Multidetector CT imaging of the chest was performed using the standard protocol during bolus administration of intravenous contrast.  Multiplanar CT image reconstructions and MIPs were obtained to evaluate the vascular anatomy. CONTRAST:  45mL OMNIPAQUE IOHEXOL 350 MG/ML SOLN COMPARISON:  CT chest 12/24/2018, 06/06/2018 FINDINGS: Cardiovascular: Satisfactory opacification the pulmonary arteries to the segmental level. No pulmonary artery filling defects are identified. Central pulmonary arteries are top-normal caliber. Normal cardiac size. No pericardial effusion. Calcifications present upon the mitral annulus. Coronary artery calcifications are present as well. The aortic root is suboptimally assessed given cardiac pulsation artifact. No acute luminal abnormality of the imaged aorta. No periaortic stranding or hemorrhage. Atherosclerotic plaque within the normal caliber aorta. Normal 3 vessel branching of the aortic arch. Proximal great vessels are mildly calcified. Mediastinum/Nodes: Stable appearance of some fatty stippling and soft tissue in the anterior mediastinum (5/48). No mediastinal fluid or gas. Normal thyroid gland and thoracic inlet. No acute abnormality of the trachea or esophagus. No worrisome mediastinal, hilar or axillary adenopathy. Lungs/Pleura: Redemonstration of the emphysematous changes and subpleural reticular opacities in the both lung apices, not significantly progressive from the comparison exam. There is diffuse mild airways thickening but without significant bronchiectasis. Some clustered ground-glass nodularity is again seen in the right middle lobe and to a lesser extent the superior segments of both lower lobes and the lingula. These are similar in appearance and extent to the prior. No other focal consolidative opacity is seen. No pneumothorax. No pleural effusion. Upper Abdomen: No acute abnormalities present in the visualized portions of the upper abdomen. Musculoskeletal: No acute osseous abnormality or suspicious osseous lesion. Multilevel degenerative changes are present in the imaged portions of the spine.  Mild degenerative changes in the shoulders. No worrisome chest wall lesion. Review of the MIP images confirms the above findings. IMPRESSION: 1. No evidence of pulmonary embolism. 2. Tree-in-bud and centrilobular ground-glass nodularity in the right middle lobe and to a lesser extent the superior segments of both lower lobes and the lingula, could reflect chronic atypical infection/inflammation. Correlate with history and if there is clinical concern, sputum cultures could be obtained. 3. Stable emphysematous changes (Emphysema (ICD10-J43.9).) With additional superimposed reticular change and bronchiolectasis towards the upper lobes in a distribution which is atypical for typical pulmonary fibrosis. Overall extent is similar to prior. 4. Stable amorphous soft tissue attenuation with fatty stippling in the anterior mediastinum, favored to reflect a thymic remnant or other benign process given in essentially unchanged appearance since 2019. 5. Coronary artery calcifications. 6. Aortic Atherosclerosis (ICD10-I70.0) Electronically Signed   By: Elwin Sleight.D.  On: 07/19/2020 20:08   MR TIBIA FIBULA LEFT WO CONTRAST  Result Date: 07/20/2020 CLINICAL DATA:  Left lower leg erythema, tenderness and edema with low-grade fever. Nondraining ulcer involving the plantar aspect of the left foot. EXAM: MRI OF LOWER LEFT EXTREMITY WITHOUT CONTRAST TECHNIQUE: Multiplanar, multisequence MR imaging of the left lower leg was performed. No intravenous contrast was administered. COMPARISON:  Left foot radiographs 07/19/2020 FINDINGS: Bones/Joint/Cartilage This examination is limited to the left lower leg. The left foot was imaged separately. On this study, both lower legs are included on the coronal images. There is no evidence of acute fracture, dislocation or osteomyelitis. Signal within the distal left tibia and fibula is somewhat limited. The ankle and knee joints are incompletely visualized. Ligaments Not relevant for  exam/indication. Muscles and Tendons Mild edema involving the distal gastrocnemius and soleus muscles without focal fluid collection. The visualized patellar and ankle tendons are intact, although are incompletely visualized. Soft tissues Asymmetric subcutaneous edema within the mid to distal left lower leg. Possible mild skin ulceration medially in the distal lower leg, suboptimally evaluated due to edge of field of view. No evidence of focal fluid collection or foreign body. IMPRESSION: 1. Asymmetric subcutaneous edema within the mid to distal left lower leg consistent with cellulitis. Possible mild skin ulceration medially in the distal lower leg, suboptimally evaluated due to edge of field of view. 2. T2 hyperintensity distally within the left gastrocnemius and soleus muscles, potentially mild myositis. No focal fluid collection. 3. No evidence of osteomyelitis. 4. See separate report of the left foot. Electronically Signed   By: Richardean Sale M.D.   On: 07/20/2020 15:18   MR FOOT LEFT WO CONTRAST  Result Date: 07/20/2020 CLINICAL DATA:  Plantar left foot wound with erythema.  Fever EXAM: MRI OF THE LEFT FOOT WITHOUT CONTRAST TECHNIQUE: Multiplanar, multisequence MR imaging of the left foot was performed. No intravenous contrast was administered. COMPARISON:  X-ray 07/19/2020 FINDINGS: Bones/Joint/Cartilage No acute fracture. No malalignment. No bone marrow edema. No bony erosion or areas of cortical destruction are seen. Mild chondral irregularity at the lateral talar shoulder with underlying subchondral signal changes. No subchondral fracture. No tibiotalar or subtalar joint effusion. Minimal arthropathy of the first MTP joint. Ligaments Intact Lisfranc ligament. Collateral ligaments of the forefoot appear intact. No obvious ligamentous injury of the ankle. Muscles and Tendons Intact Achilles tendon. The flexor and extensor tendons of the foot and ankle are intact without tear, significant tendinosis,  or tenosynovial fluid collection. There is atrophy and fatty infiltration of the intrinsic foot musculature with diffuse edema-like intramuscular signal suggesting denervation changes and/or myositis. Soft tissues Small plantar skin ulceration underlying the fourth toe at the level of the proximal phalanx (series 7, image 55). Subcutaneous edema is seen circumferentially at the ankle and predominantly involving the dorsum of the forefoot. No organized soft tissue fluid collection. IMPRESSION: 1. Small plantar soft tissue ulceration underlying the fourth toe at the level of the proximal phalanx. No underlying osteomyelitis. 2. Subcutaneous edema circumferentially at the ankle and predominantly involving the dorsum of the forefoot. Findings suggestive of cellulitis. No organized soft tissue fluid collection. 3. Atrophy and fatty infiltration of the intrinsic foot musculature with diffuse edema-like intramuscular signal suggesting denervation changes and/or myositis. Electronically Signed   By: Davina Poke D.O.   On: 07/20/2020 15:29   DG Foot Complete Left  Result Date: 07/19/2020 CLINICAL DATA:  59 year old male with cellulitis and laceration to the left foot. EXAM: LEFT FOOT - COMPLETE 3+  VIEW COMPARISON:  None. FINDINGS: Calcified peripheral vascular disease. Soft tissue swelling in the distal foot. No soft tissue gas. Pes planus. Bone mineralization is within normal limits. No fracture, dislocation, or cortical osteolysis identified. IMPRESSION: 1. Soft tissue swelling with no acute osseous abnormality identified. 2. Extensive calcified peripheral vascular disease. Electronically Signed   By: Genevie Ann M.D.   On: 07/19/2020 23:33   VAS [place] LOWER EXTREMITY VENOUS (DVT) (ONLY [facility] & [facility])  Result Date: 07/19/2020  Lower Venous DVTStudy Indications: Pain, Swelling, and Edema.  Risk Factors: None identified. Performing Technologist: Griffin Basil RCT RDMS  Examination Guidelines: A complete evaluation  includes B-mode imaging, spectral Doppler, color Doppler, and power Doppler as needed of all accessible portions of each vessel. Bilateral testing is considered an integral part of a complete examination. Limited examinations for reoccurring indications may be performed as noted. The reflux portion of the exam is performed with the patient in reverse Trendelenburg.  +---------+---------------+---------+-----------+----------+--------------+ LEFT     CompressibilityPhasicitySpontaneityPropertiesThrombus Aging +---------+---------------+---------+-----------+----------+--------------+ CFV      Full           Yes      Yes                                 +---------+---------------+---------+-----------+----------+--------------+ SFJ      Full                                                        +---------+---------------+---------+-----------+----------+--------------+ FV Prox  Full                                                        +---------+---------------+---------+-----------+----------+--------------+ FV Mid   Full                                                        +---------+---------------+---------+-----------+----------+--------------+ FV DistalFull                                                        +---------+---------------+---------+-----------+----------+--------------+ PFV      Full                                                        +---------+---------------+---------+-----------+----------+--------------+ POP      Full           Yes      Yes                                 +---------+---------------+---------+-----------+----------+--------------+ PTV      Full                                                        +---------+---------------+---------+-----------+----------+--------------+  PERO     Full                                                         +---------+---------------+---------+-----------+----------+--------------+     Summary: RIGHT: - No evidence of common femoral vein obstruction.  LEFT: - There is no evidence of deep vein thrombosis in the lower extremity.  - No cystic structure found in the popliteal fossa.  *See table(s) above for measurements and observations. Electronically signed by Deitra Mayo MD on 07/19/2020 at 8:07:44 PM.    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 Spagnoli is a 59 year old male with past medical history of T2DM with neuropathy, HTN, HLD, Asthma/COPD who presented to [facility] after a near syncopal epidose with chest pain, left leg swelling and pain found to have left lower extremity cellulitis.  #Cellulitis of left lower extremity, active Patient presented with erythema, tenderness, and edema of left lower extremity, as well as fever of 100.5. Noted to have a non-draining ulcer on the plantar surface of the left foot. He does not recall any injury, but does have diabetic neuropathy so an unnoticed puncture injury is likely. MRI of left foot and leg did not reveal underlying osteomyelitis or abscess. He reports that his pain is significantly improved. Objectively, his leg looks much better today. -Continue cefazolin (day 3 of 7) -Continue to follow blood cultures -Adhere to wound consult recommendations -CBC daily -Ibuprofen and Tylenol PRN  #Type 2 diabetes mellitus, chronic A1c 7.2 (07/20/20) improved from 7.6 (05/21). Patient takes Empagoflozin-Metformin 5-1000mg  BID, Pioglitazone 30mg  daily and Dulaglutide injection weekly. We are currently holding home meds and he is on Sliding Scale Insulin during admission and closely monitoring glucose with current  infection. -SSI -CBG monitoring  #Hyperlipidemia, chronic Home meds include Atorvastatin 40mg  and Aspirin 81mg . We will continue during admission. -continue Atorvastatin 40mg  daily  -continue ASA 81mg  daily  #Asthma/COPD Takes Montelukast 10mg ,  Levocetirizine 5mg , and albuterol nebulizer. Reports having to use albuterol 3-4 times daily. Patient reports mild shortness of breath with wheezing noted on physical examination. -Montelukast 10mg  at bedtime -Albuterol nebulizer Q4H PRN -Duoneb nebulizer twice daily -Dulera inhaler twice daily  VTE ppx: Lovenox Diet: Heart healthy / carb modified IVF: none Code status: Full code Bowel regimen: None  Cato Mulligan, MD 07/21/2020, 1:52 PM  Pager: [phone]

## 2020-07-21 NOTE — Progress Notes (Signed)
Physical Therapy Treatment Patient Details Name: Richard [name] MRN: [id number] DOB: [birth year] Today's Date: 07/21/2020    History of Present Illness Pt is a 59 y/o male with PMH of COPD, HTN, DM, neuropathy, glaucoma, substance abuse, presenting to ED due to chest pain. Reports initally experiencing near syncopal episodes, central chest pain/SOB that radiates down his R arm, and increased L foot pain. Negative troponins and unconcerning EKG, LE doppler and CTA negative. Found with cellulitis of L LE and diabetic foot ulcer.     PT Comments    Patient progressing with activity tolerance and able to walk in hallway.  Fatigued and SOB needing seated rest, but VSS.  Also already up and walking in room and washing up today.  Currently still appropriate for home without follow up, but PT to continue to follow acutely.    Follow Up Recommendations  No PT follow up     Equipment Recommendations  None recommended by PT    Recommendations for Other Services       Precautions / Restrictions Precautions Precautions: Fall    Mobility  Bed Mobility               General bed mobility comments: up in chair  Transfers Overall transfer level: Needs assistance Equipment used: Rolling walker (2 wheeled) Transfers: Sit to/from Stand Sit to Stand: Supervision         General transfer comment: heavy UE support needed  Ambulation/Gait Ambulation/Gait assistance: Supervision;Min guard Gait Distance (Feet): 120 Feet (x 2) Assistive device: Rolling walker (2 wheeled) Gait Pattern/deviations: Step-through pattern;Antalgic;Decreased stride length     General Gait Details: increasing antalgia with increased distance; had to stop and rest in desk chair in hallway due to SOB; fatigue   Stairs             Wheelchair Mobility    Modified Rankin (Stroke Patients Only)       Balance Overall balance assessment: Needs assistance   Sitting balance-Leahy Scale: Good      Standing balance support: Bilateral upper extremity supported;During functional activity;No upper extremity supported Standing balance-Leahy Scale: Fair Standing balance comment: able to stand statically without UE support but preference to UE support due to L LE pain                            Cognition Arousal/Alertness: Awake/alert Behavior During Therapy: WFL for tasks assessed/performed Overall Cognitive Status: Within Functional Limits for tasks assessed                                        Exercises      General Comments General comments (skin integrity, edema, etc.): SOB with activity, but SpO2 97%, HR 85      Pertinent Vitals/Pain Pain Score: 5  Pain Location: L foot with ambulation Pain Descriptors / Indicators: Aching;Sore Pain Intervention(s): Monitored during session;Repositioned    Home Living                      Prior Function            PT Goals (current goals can now be found in the care plan section) Progress towards PT goals: Progressing toward goals    Frequency    Min 3X/week      PT Plan Current plan remains appropriate    Co-evaluation  AM-PAC PT "6 Clicks" Mobility   Outcome Measure  Help needed turning from your back to your side while in a flat bed without using bedrails?: None Help needed moving from lying on your back to sitting on the side of a flat bed without using bedrails?: None Help needed moving to and from a bed to a chair (including a wheelchair)?: A Little Help needed standing up from a chair using your arms (e.g., wheelchair or bedside chair)?: A Little Help needed to walk in hospital room?: A Little Help needed climbing 3-5 steps with a railing? : A Little 6 Click Score: 20    End of Session   Activity Tolerance: Patient limited by fatigue Patient left: in chair;with call bell/phone within reach   PT Visit Diagnosis: Other abnormalities of gait and mobility  (R26.89)     Time: 8657-8469 PT Time Calculation (min) (ACUTE ONLY): 29 min  Charges:  $Gait Training: 8-22 mins $Therapeutic Activity: 8-22 mins                     Richard [name], PT Acute Rehabilitation Services Pager:[phone] Office:[phone] 07/21/2020    Richard [name] 07/21/2020, 1:11 PM

## 2020-07-22 LAB — BASIC METABOLIC PANEL
Anion gap: 10 (ref 5–15)
BUN: 15 mg/dL (ref 8–23)
CO2: 23 mmol/L (ref 22–32)
Calcium: 8.5 mg/dL — ABNORMAL LOW (ref 8.9–10.3)
Chloride: 103 mmol/L (ref 98–111)
Creatinine, Ser: 0.86 mg/dL (ref 0.61–1.24)
GFR, Estimated: >60 mL/min (ref >60)
Glucose, Bld: 189 mg/dL — ABNORMAL HIGH (ref 70–99)
Potassium: 4.1 mmol/L (ref 3.5–5.1)
Sodium: 136 mmol/L (ref 135–145)

## 2020-07-22 LAB — CBC
HCT: 35.9 % — ABNORMAL LOW (ref 39.0–52.0)
Hemoglobin: 11.1 g/dL — ABNORMAL LOW (ref 13.0–17.0)
MCH: 25.8 pg — ABNORMAL LOW (ref 26.0–34.0)
MCHC: 30.9 g/dL (ref 30.0–36.0)
MCV: 83.3 fL (ref 80.0–100.0)
Platelets: 277 10*3/uL (ref 150–400)
RBC: 4.31 MIL/uL (ref 4.22–5.81)
RDW: 13.7 % (ref 11.5–15.5)
WBC: 7.2 10*3/uL (ref 4.0–10.5)
nRBC: 0 % (ref 0.0–0.2)

## 2020-07-22 LAB — GLUCOSE, CAPILLARY
Glucose-Capillary: 149 mg/dL — ABNORMAL HIGH (ref 70–99)
Glucose-Capillary: 152 mg/dL — ABNORMAL HIGH (ref 70–99)
Glucose-Capillary: 131 mg/dL — ABNORMAL HIGH (ref 70–99)
Glucose-Capillary: 104 mg/dL — ABNORMAL HIGH (ref 70–99)

## 2020-07-22 MED ORDER — VANCOMYCIN HCL 10 G IV SOLR
2500.0000 mg | Freq: Once | INTRAVENOUS | Status: AC
  Administered 2020-07-22: 2500 mg via INTRAVENOUS
  Filled 2020-07-22: qty 2500

## 2020-07-22 MED ORDER — METRONIDAZOLE IN NACL 5-0.79 MG/ML-% IV SOLN
500.0000 mg | Freq: Three times a day (TID) | INTRAVENOUS | Status: DC
  Administered 2020-07-22 – 2020-07-23 (×3): 500 mg via INTRAVENOUS
  Filled 2020-07-22 (×3): qty 100

## 2020-07-22 MED ORDER — ACETAMINOPHEN 650 MG RE SUPP: 650.0000 mg | RECTAL | Status: DC | PRN

## 2020-07-22 MED ORDER — VANCOMYCIN HCL IN DEXTROSE 1-5 GM/200ML-% IV SOLN
1000.0000 mg | Freq: Two times a day (BID) | INTRAVENOUS | Status: DC
  Administered 2020-07-23: 1000 mg via INTRAVENOUS
  Filled 2020-07-22 (×2): qty 200

## 2020-07-22 MED ORDER — ACETAMINOPHEN 325 MG PO TABS: 650.0000 mg | ORAL_TABLET | ORAL | Status: DC | PRN

## 2020-07-22 MED ORDER — SODIUM CHLORIDE 0.9 % IV SOLN
2.0000 g | INTRAVENOUS | Status: DC
  Administered 2020-07-22: 2 g via INTRAVENOUS
  Filled 2020-07-22: qty 20

## 2020-07-22 NOTE — Progress Notes (Signed)
Occupational Therapy Treatment Patient Details Name: Richard [name] MRN: [id number] DOB: 14-Dec-1960 Today's Date: 07/22/2020    History of present illness Pt is a 59 y/o male with PMH of COPD, HTN, DM, neuropathy, glaucoma, substance abuse, presenting to ED due to chest pain. Reports initally experiencing near syncopal episodes, central chest pain/SOB that radiates down his R arm, and increased L foot pain. Negative troponins and unconcerning EKG, LE doppler and CTA negative. Found with cellulitis of L LE and diabetic foot ulcer.    OT comments  Patient progressing well towards OT goals. Completing ADLs and simulated tub transfers using 3:1 and grabbars with supervision. Educated on options for 3:1 for shower chair and over commode for ease of transfers and independence at home.  Pt thankful for education, reports no further questions for OT at this time (main concern is for stairs).     Follow Up Recommendations  No OT follow up;Supervision - Intermittent    Equipment Recommendations  3 in 1 bedside commode;Other (comment)    Recommendations for Other Services      Precautions / Restrictions Precautions Precautions: Fall Restrictions Weight Bearing Restrictions: No       Mobility Bed Mobility Overal bed mobility: Modified Independent             General bed mobility comments: OOB upon entry in room   Transfers Overall transfer level: Needs assistance Equipment used: Rolling walker (2 wheeled) Transfers: Sit to/from Stand Sit to Stand: Supervision         General transfer comment: supervision for safety     Balance Overall balance assessment: Needs assistance Sitting-balance support: No upper extremity supported;Feet supported Sitting balance-Leahy Scale: Good     Standing balance support: Bilateral upper extremity supported;During functional activity Standing balance-Leahy Scale: Fair Standing balance comment: able to stand statically without UE support but  preference to UE support due to L LE pain                           ADL either performed or assessed with clinical judgement   ADL Overall ADL's : Needs assistance/impaired                         Toilet Transfer: Supervision/safety;RW;Ambulation       Tub/ Shower Transfer: Tub transfer;Supervision/safety;Ambulation;3 in 1;Grab bars Tub/Shower Transfer Details (indicate cue type and reason): simulated home setup, reviewed use of 3:1 as shower chair  Functional mobility during ADLs: Supervision/safety;Rolling walker       Vision   Vision Assessment?: No apparent visual deficits   Perception     Praxis      Cognition Arousal/Alertness: Awake/alert Behavior During Therapy: WFL for tasks assessed/performed Overall Cognitive Status: Within Functional Limits for tasks assessed                                 General Comments: agreeable to all education, good safety awareness and problem solving        Exercises     Shoulder Instructions       General Comments bandage on L foot    Pertinent Vitals/ Pain       Pain Assessment: Faces Faces Pain Scale: Hurts little more Pain Location: L foot with ambulation Pain Descriptors / Indicators: Aching;Sore Pain Intervention(s): Limited activity within patient's tolerance;Monitored during session;Repositioned  Home Living  Prior Functioning/Environment              Frequency  Min 2X/week        Progress Toward Goals  OT Goals(current goals can now be found in the care plan section)  Progress towards OT goals: Progressing toward goals  Acute Rehab OT Goals Patient Stated Goal: to get back to where i was before OT Goal Formulation: With patient  Plan Discharge plan remains appropriate;Frequency needs to be updated    Co-evaluation                 AM-PAC OT "6 Clicks" Daily Activity     Outcome Measure    Help from another person eating meals?: None Help from another person taking care of personal grooming?: A Little Help from another person toileting, which includes using toliet, bedpan, or urinal?: A Little Help from another person bathing (including washing, rinsing, drying)?: A Little Help from another person to put on and taking off regular upper body clothing?: None Help from another person to put on and taking off regular lower body clothing?: A Little 6 Click Score: 20    End of Session Equipment Utilized During Treatment: Rolling walker  OT Visit Diagnosis: Other abnormalities of gait and mobility (R26.89);Pain Pain - Right/Left: Left Pain - part of body: Ankle and joints of foot   Activity Tolerance Patient tolerated treatment well   Patient Left in bed;with call bell/phone within reach   Nurse Communication Mobility status        Time: 7591-6384 OT Time Calculation (min): 19 min  Charges: OT General Charges $OT Visit: 1 Visit OT Treatments $Self Care/Home Management : 8-22 mins  Jolaine Artist, OT [place] Pager [phone] Office [phone]    [name] 07/22/2020, 12:08 PM

## 2020-07-22 NOTE — Progress Notes (Signed)
Pharmacy Antibiotic Note  Richard [name] is a 59 y.o. male admitted on 07/19/2020 with worsening cellulitis.  Pharmacy has been consulted for Vancomycin dosing. Cefazolin has been changed to Ceftriaxone and Flagyl added per MD dosing.   WBC is normal. Afebrile. SCr is stable.  Patient was treated with Cefazolin alone since 10/12 but ankle swelling is worse - MD wants to broaden therapy.   Plan: Vancomycin 2500 mg IV x1 then 1000 mg IV every 12 hours.  Monitor renal function, clinical status, and culture results.  Follow-up LOT and ability to narrow  Height: 6\' 3"  (190.5 cm) Weight: 105.7 kg (233 lb 0.6 oz) IBW/kg (Calculated) : 84.5  Temp (24hrs), Avg:98.9 F (37.2 C), Min:98.8 F (37.1 C), Max:98.9 F (37.2 C)  Recent Labs  Lab 07/19/20 1649 07/19/20 2015 07/20/20 0029 07/21/20 0651 07/22/20 0158  WBC 12.3*  --  9.3 7.4 7.2  CREATININE 1.15  --  1.16 0.82 0.86  LATICACIDVEN  --  0.9 1.1  --   --     Estimated Creatinine Clearance: 118.7 mL/min (by C-G formula based on SCr of 0.86 mg/dL).    No Known Allergies  Antimicrobials this admission: Cefazolin 10/12 >> 10/15 Vancomycin 10/15 >> Ceftriaxone 10/15 >> Metronidazole 10/15 >>  Dose adjustments this admission:   Microbiology results: 10/12 BCx >>  Thank you for allowing pharmacy to be a part of this patient's care.  Sloan Leiter, PharmD, BCPS, BCCCP Clinical Pharmacist Please refer to [facility] for [place] numbers 07/22/2020 2:36 PM

## 2020-07-22 NOTE — Progress Notes (Signed)
Subjective:  Richard [name] is a 59 year old male with past medical history of T2DM with neuropathy, HTN, HLD, Asthma/COPD who presented to [facility] on 10/12 after a near syncopal epidose with chest pain, and left leg swelling and pain found to have non-purulent left lower extremity cellulitis.  Overnight, no acute events.  This morning, he reports that his left foot is very painful. He states after completing therapy with PT this morning, his foot pain has gradually worsened. He feels that his foot and ankle have become more swollen since yesterday. He reports that he is concerned about going home in this state. Otherwise, he states that his shortness of breath is resolved with the prescribed inhalers.  Objective:  Vital signs in last 24 hours: Vitals:   07/21/20 1703 07/21/20 2126 07/21/20 2149 07/22/20 0601  BP: (!) 149/78 127/71  127/84  Pulse: 85 85 81 87  Resp: 18 18 17 18   Temp: 98.8 F (37.1 C) 98.9 F (37.2 C)  98.9 F (37.2 C)  TempSrc: Oral Oral  Oral  SpO2: 98% 95% 96% 100%  Weight:  105.7 kg    Height:      SpO2: 100 %  Filed Weights   07/20/20 0021 07/20/20 2122 07/21/20 2126  Weight: 105.7 kg 105.7 kg 105.7 kg    Intake/Output Summary (Last 24 hours) at 07/22/2020 0645 Last data filed at 07/22/2020 0601 Gross per 24 hour  Intake 1520 ml  Output 1100 ml  Net 420 ml   Physical Exam Vitals and nursing note reviewed.  Constitutional:      Appearance: He is well-developed.  HENT:     Head: Normocephalic and atraumatic.  Cardiovascular:     Rate and Rhythm: Normal rate and regular rhythm.     Heart sounds: Normal heart sounds.  Pulmonary:     Effort: Pulmonary effort is normal. No respiratory distress.     Breath sounds: Wheezing present.  Abdominal:     General: Bowel sounds are normal.     Palpations: Abdomen is soft.     Tenderness: There is no abdominal tenderness.  Musculoskeletal:        General: Normal range of motion.     Cervical back: Normal  range of motion and neck supple.     Left lower leg: Tenderness present. Edema present.     Comments: Patient has minimal edema of left lower leg, however dorsal, medial, and lateral aspects of foot and ankle are edematous and significantly tender to light palpation.  Skin:    General: Skin is warm and dry.     Capillary Refill: Capillary refill takes less than 2 seconds.     Comments: Ulceration of lateral aspect of sole of left foot with serosanguineous drainage. Erythema noted of lateral aspect of left foot.  Neurological:     General: No focal deficit present.     Mental Status: He is alert and oriented to person, place, and time.  Psychiatric:        Mood and Affect: Mood normal.        Behavior: Behavior normal.    CBC Latest Ref Rng & Units 07/22/2020 07/21/2020 07/20/2020  WBC 4.0 - 10.5 K/uL 7.2 7.4 9.3  Hemoglobin 13.0 - 17.0 g/dL 11.1(L) 11.7(L) 10.9(L)  Hematocrit 39 - 52 % 35.9(L) 37.2(L) 34.7(L)  Platelets 150 - 400 K/uL 277 256 228   CMP Latest Ref Rng & Units 07/22/2020 07/21/2020 07/20/2020  Glucose 70 - 99 mg/dL 189(H) 144(H) 197(H)  BUN 8 -  23 mg/dL 15 12 12   Creatinine 0.61 - 1.24 mg/dL 0.86 0.82 1.16  Sodium 135 - 145 mmol/L 136 138 136  Potassium 3.5 - 5.1 mmol/L 4.1 4.5 3.8  Chloride 98 - 111 mmol/L 103 106 102  CO2 22 - 32 mmol/L 23 24 24   Calcium 8.9 - 10.3 mg/dL 8.5(L) 8.6(L) 8.7(L)  Total Protein 6.5 - 8.1 g/dL - - -  Total Bilirubin 0.3 - 1.2 mg/dL - - -  Alkaline Phos 38 - 126 U/L - - -  AST 15 - 41 U/L - - -  ALT 17 - 63 U/L - - -   10/12 Blood cultures - NGTD for 3 days  MR TIBIA FIBULA LEFT WO CONTRAST  Result Date: 07/20/2020 CLINICAL DATA:  Left lower leg erythema, tenderness and edema with low-grade fever. Nondraining ulcer involving the plantar aspect of the left foot. EXAM: MRI OF LOWER LEFT EXTREMITY WITHOUT CONTRAST TECHNIQUE: Multiplanar, multisequence MR imaging of the left lower leg was performed. No intravenous contrast was  administered. COMPARISON:  Left foot radiographs 07/19/2020 FINDINGS: Bones/Joint/Cartilage This examination is limited to the left lower leg. The left foot was imaged separately. On this study, both lower legs are included on the coronal images. There is no evidence of acute fracture, dislocation or osteomyelitis. Signal within the distal left tibia and fibula is somewhat limited. The ankle and knee joints are incompletely visualized. Ligaments Not relevant for exam/indication. Muscles and Tendons Mild edema involving the distal gastrocnemius and soleus muscles without focal fluid collection. The visualized patellar and ankle tendons are intact, although are incompletely visualized. Soft tissues Asymmetric subcutaneous edema within the mid to distal left lower leg. Possible mild skin ulceration medially in the distal lower leg, suboptimally evaluated due to edge of field of view. No evidence of focal fluid collection or foreign body. IMPRESSION: 1. Asymmetric subcutaneous edema within the mid to distal left lower leg consistent with cellulitis. Possible mild skin ulceration medially in the distal lower leg, suboptimally evaluated due to edge of field of view. 2. T2 hyperintensity distally within the left gastrocnemius and soleus muscles, potentially mild myositis. No focal fluid collection. 3. No evidence of osteomyelitis. 4. See separate report of the left foot. Electronically Signed   By: Richardean Sale M.D.   On: 07/20/2020 15:18   MR FOOT LEFT WO CONTRAST  Result Date: 07/20/2020 CLINICAL DATA:  Plantar left foot wound with erythema.  Fever EXAM: MRI OF THE LEFT FOOT WITHOUT CONTRAST TECHNIQUE: Multiplanar, multisequence MR imaging of the left foot was performed. No intravenous contrast was administered. COMPARISON:  X-ray 07/19/2020 FINDINGS: Bones/Joint/Cartilage No acute fracture. No malalignment. No bone marrow edema. No bony erosion or areas of cortical destruction are seen. Mild chondral  irregularity at the lateral talar shoulder with underlying subchondral signal changes. No subchondral fracture. No tibiotalar or subtalar joint effusion. Minimal arthropathy of the first MTP joint. Ligaments Intact Lisfranc ligament. Collateral ligaments of the forefoot appear intact. No obvious ligamentous injury of the ankle. Muscles and Tendons Intact Achilles tendon. The flexor and extensor tendons of the foot and ankle are intact without tear, significant tendinosis, or tenosynovial fluid collection. There is atrophy and fatty infiltration of the intrinsic foot musculature with diffuse edema-like intramuscular signal suggesting denervation changes and/or myositis. Soft tissues Small plantar skin ulceration underlying the fourth toe at the level of the proximal phalanx (series 7, image 55). Subcutaneous edema is seen circumferentially at the ankle and predominantly involving the dorsum of the forefoot. No organized  soft tissue fluid collection. IMPRESSION: 1. Small plantar soft tissue ulceration underlying the fourth toe at the level of the proximal phalanx. No underlying osteomyelitis. 2. Subcutaneous edema circumferentially at the ankle and predominantly involving the dorsum of the forefoot. Findings suggestive of cellulitis. No organized soft tissue fluid collection. 3. Atrophy and fatty infiltration of the intrinsic foot musculature with diffuse edema-like intramuscular signal suggesting denervation changes and/or myositis. Electronically Signed   By: Davina Poke D.O.   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  #Nonpurulent cellulitis of left lower extremity, active Patient presented with erythema, edema and tenderness of left lower extremity and foot. Noted to have a non-purulent ulcer of the sole of the foot suspicious for a puncture injury. Although patient denies known puncture of the foot, his diabetic neuropathy may have impaired his sensation to injury. MRI of left foot and leg did not reveal underlying osteomyelitis or abscess of the leg or foot. Although patient's lower leg appears to have less edema than prior, his ankle is significantly more edematous and tender than prior examinations. Due to the progression of his nonpurulent cellulitis despite treatment with cefazolin, he requires broadening of his antibiotic coverage. We will transition patient from cefazolin to vancomycin, ceftriaxone and metronidazole. He will require continued inpatient management of his condition in the setting of his comorbid conditions. -Discontinue cefazolin  -Start vancomycin, ceftriaxone and metronidazole -Adhere to wound consult recommendations -CBC daily -Ibuprofen Q6H and Tylenol Q4H PRN  #Type 2 diabetes mellitus, chronic A1c 7.2 (07/20/20) improved from 7.6 (05/21). Patient takes Empagoflozin-Metformin 5-1000mg  BID, Pioglitazone 30mg  daily and Dulaglutide injection weekly at home. We are currently holding his home medications and he is on Sliding Scale Insulin during this admission. His sugars have been well controlled throughout this admission. -Moderate SSI (0-15 units) -CBG monitoring  #Hyperlipidemia, chronic Patient takes atorvastatin and aspirin at home. -continue Atorvastatin 40mg  daily  -continue ASA 81mg  daily  #Asthma/COPD, chronic Takes Montelukast 10mg , Levocetirizine 5mg , and albuterol nebulizer. Reports having to use albuterol 3-4 times daily. Patient reports his  shortness of breath and wheezing are much improved with inhaler regimen. -Montelukast 10mg  at bedtime -Albuterol nebulizer Q4H PRN -Duoneb nebulizer twice daily -Dulera inhaler twice daily  VTE ppx: Lovenox Diet: Heart healthy / carb modified IVF: none Code status: Full code Bowel regimen: None  Cato Mulligan, MD 07/22/2020, 6:45 AM  Pager: [phone]

## 2020-07-22 NOTE — Progress Notes (Signed)
PT Cancellation Note  Patient Details Name: Richard [name] MRN: [id number] DOB: 1961-05-13   Cancelled Treatment:    Reason Eval/Treat Not Completed: Patient declined, no reason specified. PT returned as promised to further practice stair training in case of d/c home at this time. However, upon arrival, the patient reported that per his MD, he is no longer planning to d/c today due to progressive LE swelling and plans to continue monitoring acutely. He also reported general fatigue, and declined second session this afternoon. PT will follow-up to continue stair training and improving activity tolerance prior to d/c home.   Hardie Pulley, DPT   Acute Rehabilitation Department Pager #: [phone]   Otho Bellows 07/22/2020, 2:24 PM

## 2020-07-22 NOTE — Progress Notes (Signed)
Physical Therapy Treatment Patient Details Name: Richard [name] MRN: [id number] DOB: August 07, 1961 Today's Date: 07/22/2020    History of Present Illness Pt is a 59 y/o male with PMH of COPD, HTN, DM, neuropathy, glaucoma, substance abuse, presenting to ED due to chest pain. Reports initally experiencing near syncopal episodes, central chest pain/SOB that radiates down his R arm, and increased L foot pain. Negative troponins and unconcerning EKG, LE doppler and CTA negative. Found with cellulitis of L LE and diabetic foot ulcer.     PT Comments    The pt is progressing well with mobility and therapy goals at this time. He was able to initiate stair training this session, but reports significant increase in pain and fatigue following 2 steps and required seated rest. The pt has 12 steps to navigate in his home to reach his bedroom and bathroom, but does report he has a recliner and half-bath downstairs where he can sleep if needed initially. The pt was able to navigate the stairs with minA for stability, but currently lacks the strength, power, and endurance to navigate the number of stairs needed at his home. The pt has requested PT return later in the day prior to d/c home to further improve his confidence, but the pt would be safe to d/c home with assist from daughter as we discussed multiple techniques for stair navigation that may not be as physically taxing for him. I plan to follow up with this patient at attempt further stair training at a later time.    Follow Up Recommendations  No PT follow up     Equipment Recommendations  Crutches;3in1 (PT)    Recommendations for Other Services       Precautions / Restrictions Precautions Precautions: Fall Restrictions Weight Bearing Restrictions: No    Mobility  Bed Mobility Overal bed mobility: Modified Independent                Transfers Overall transfer level: Needs assistance Equipment used: Crutches Transfers: Sit to/from  Stand Sit to Stand: Min guard;Supervision         General transfer comment: initially with increased time and effort to rise from bed, required multiple attempts. Progressed to supervision without cues for sit-stand from recliner with use of crutches  Ambulation/Gait Ambulation/Gait assistance: Min guard Gait Distance (Feet): 30 Feet Assistive device: Crutches Gait Pattern/deviations: Step-through pattern;Antalgic;Decreased stride length Gait velocity: decreased Gait velocity interpretation: <1.31 ft/sec, indicative of household ambulator General Gait Details: increasing antalgia with distance, reports increased pain but is able to wt bear through heel. Good stability with crutches   Stairs Stairs: Yes Stairs assistance: Min assist Stair Management: One rail Right;With crutches;Step to pattern;Forwards Number of Stairs: 1 (x2) General stair comments: pt completed hop-to withuse of rail and crutches x2, reports significant fatigue requiring seated rest after 2 stairs. HR to 105bpm (pt has 12 steps in his home to reach his bedroom/bathroom)      Balance Overall balance assessment: Needs assistance Sitting-balance support: No upper extremity supported;Feet supported Sitting balance-Leahy Scale: Good     Standing balance support: Bilateral upper extremity supported;During functional activity;No upper extremity supported Standing balance-Leahy Scale: Fair Standing balance comment: able to stand statically without UE support but preference to UE support due to L LE pain                            Cognition Arousal/Alertness: Awake/alert Behavior During Therapy: WFL for tasks assessed/performed Overall Cognitive Status:  Within Functional Limits for tasks assessed                                 General Comments: agreeable to all education, good safety awareness and problem solving      Exercises      General Comments General comments (skin  integrity, edema, etc.): bandage on L foot      Pertinent Vitals/Pain Pain Assessment: Faces Faces Pain Scale: Hurts little more Pain Location: L foot with ambulation Pain Descriptors / Indicators: Aching;Sore Pain Intervention(s): Limited activity within patient's tolerance;Monitored during session;Repositioned           PT Goals (current goals can now be found in the care plan section) Acute Rehab PT Goals Patient Stated Goal: to get back to where i was before PT Goal Formulation: With patient Time For Goal Achievement: 08/03/20 Potential to Achieve Goals: Good Progress towards PT goals: Progressing toward goals    Frequency    Min 3X/week      PT Plan Current plan remains appropriate       AM-PAC PT "6 Clicks" Mobility   Outcome Measure  Help needed turning from your back to your side while in a flat bed without using bedrails?: None Help needed moving from lying on your back to sitting on the side of a flat bed without using bedrails?: None Help needed moving to and from a bed to a chair (including a wheelchair)?: A Little Help needed standing up from a chair using your arms (e.g., wheelchair or bedside chair)?: A Little Help needed to walk in hospital room?: A Little Help needed climbing 3-5 steps with a railing? : A Little 6 Click Score: 20    End of Session Equipment Utilized During Treatment: Gait belt Activity Tolerance: Patient limited by fatigue Patient left: in chair;with call bell/phone within reach Nurse Communication: Mobility status PT Visit Diagnosis: Other abnormalities of gait and mobility (R26.89)     Time: 9179-1505 PT Time Calculation (min) (ACUTE ONLY): 27 min  Charges:  $Gait Training: 8-22 mins $Self Care/Home Management: 8-22                     Karma Ganja, PT, DPT   Acute Rehabilitation Department Pager #: [phone]   Otho Bellows 07/22/2020, 9:22 AM

## 2020-07-22 NOTE — Plan of Care (Signed)
  Problem: Pain Managment: Goal: General experience of comfort will improve Outcome: Progressing   

## 2020-07-23 LAB — CBC
HCT: 37.1 % — ABNORMAL LOW (ref 39.0–52.0)
Hemoglobin: 11.5 g/dL — ABNORMAL LOW (ref 13.0–17.0)
MCH: 25.5 pg — ABNORMAL LOW (ref 26.0–34.0)
MCHC: 31 g/dL (ref 30.0–36.0)
MCV: 82.3 fL (ref 80.0–100.0)
Platelets: 310 10*3/uL (ref 150–400)
RBC: 4.51 MIL/uL (ref 4.22–5.81)
RDW: 13.6 % (ref 11.5–15.5)
WBC: 8.4 10*3/uL (ref 4.0–10.5)
nRBC: 0 % (ref 0.0–0.2)

## 2020-07-23 LAB — BASIC METABOLIC PANEL
Anion gap: 11 (ref 5–15)
BUN: 12 mg/dL (ref 8–23)
CO2: 22 mmol/L (ref 22–32)
Calcium: 8.7 mg/dL — ABNORMAL LOW (ref 8.9–10.3)
Chloride: 102 mmol/L (ref 98–111)
Creatinine, Ser: 0.81 mg/dL (ref 0.61–1.24)
GFR, Estimated: >60 mL/min (ref >60)
Glucose, Bld: 179 mg/dL — ABNORMAL HIGH (ref 70–99)
Potassium: 4.5 mmol/L (ref 3.5–5.1)
Sodium: 135 mmol/L (ref 135–145)

## 2020-07-23 LAB — GLUCOSE, CAPILLARY
Glucose-Capillary: 143 mg/dL — ABNORMAL HIGH (ref 70–99)
Glucose-Capillary: 148 mg/dL — ABNORMAL HIGH (ref 70–99)

## 2020-07-23 MED ORDER — AMOXICILLIN-POT CLAVULANATE 875-125 MG PO TABS: 1.0000 | ORAL_TABLET | Freq: Two times a day (BID) | ORAL | 0 refills | Status: DC

## 2020-07-23 MED ORDER — DOXYCYCLINE HYCLATE 100 MG PO CAPS
100.0000 mg | ORAL_CAPSULE | Freq: Two times a day (BID) | ORAL | 0 refills | Status: DC
Start: 2020-07-23 — End: 2020-08-01

## 2020-07-23 NOTE — Progress Notes (Deleted)
On arrival to room pt declining treatment

## 2020-07-23 NOTE — Progress Notes (Signed)
   Subjective: No acute overnight events.  Feeling considerably better today. Minimal tenderness to palpation of left foot and ankle, which is significantly improved from yesterday. Swelling has also decreased.  Very agreeable to going home today.  Objective:  Vital signs in last 24 hours: Vitals:   07/22/20 2110 07/23/20 0612 07/23/20 0754 07/23/20 0912  BP:  134/74  131/79  Pulse:  88  88  Resp:  16  18  Temp:  99.2 F (37.3 C)  99.2 F (37.3 C)  TempSrc:  Oral  Oral  SpO2: 98% 97% 98% 90%  Weight:      Height:       Physical Exam: General: Well-nourished male sitting up in bed, NAD. HENT: NCAT CV: normal rate and regular rhythm, + murmur Pulm: clear to auscultation bilaterally, no adventitious sounds noted. Abdomen: soft, nondistended, nontender, +BS.  MSK: 0-1+ edema of LLE with minimal TTP Skin: Ulceration of lateral aspect of sole of L foot with some drainage, but wrapped in dressing. Neuro: AAOx3, no focal deficits noted.  CBC Latest Ref Rng & Units 07/23/2020 07/22/2020 07/21/2020  WBC 4.0 - 10.5 K/uL 8.4 7.2 7.4  Hemoglobin 13.0 - 17.0 g/dL 11.5(L) 11.1(L) 11.7(L)  Hematocrit 39 - 52 % 37.1(L) 35.9(L) 37.2(L)  Platelets 150 - 400 K/uL 310 277 256    BMP Latest Ref Rng & Units 07/23/2020 07/22/2020 07/21/2020  Glucose 70 - 99 mg/dL 179(H) 189(H) 144(H)  BUN 8 - 23 mg/dL 12 15 12   Creatinine 0.61 - 1.24 mg/dL 0.81 0.86 0.82  BUN/Creat Ratio 10 - 24 - - -  Sodium 135 - 145 mmol/L 135 136 138  Potassium 3.5 - 5.1 mmol/L 4.5 4.1 4.5  Chloride 98 - 111 mmol/L 102 103 106  CO2 22 - 32 mmol/L 22 23 24   Calcium 8.9 - 10.3 mg/dL 8.7(L) 8.5(L) 8.6(L)    Assessment/Plan:  Principal Problem:   Cellulitis Active Problems:   Asthma-COPD overlap syndrome (HCC)   Essential hypertension   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 is a 59 year old male with PMH of T2DM  with neuropathy, HTN, HLD, Asthma/COPD who presented to [facility] on 10/12 after a near-syncopal episodes with chest pain with subsequent negative workup, and left leg swelling and pain found to have non-purulent LLE cellulitis.  Non-purulent cellulitis of LLE Leg appears considerably improved today after having started broad spectrum abx coverage yesterday with vanc/ceftriaxone/flagyl. Leg is less edematous and is minimally TTP. Will discharged today with 6 day course of doxycycline and augmentin. Will have patient follow up in IM clinic this upcoming Thursday or Friday to reevaluate LLE cellulitis. - on 2nd day of vanc/ceftriaxone/flagyl - blood cx showing no growth in 4 days - will discharge home with 6-day course of doxycycline and augmentin - f/u in [facility] on Thursday or Friday  T2DM, chronic - will restart home medications at discharge (Empagoflozin-Metformin 5-1000mg  BID, Pioglitazone 30mg  daily, dulaglutide injection weekly)  HLD, chronic - continue home medications at discharge (atorvastatin 40mg  daily, aspirin 81mg  daily)  Asthma/COPD, chronic - continue home medications at discharge (montelukast 10mg , levocetirizine 5mg , albuterol nebs)   Prior to Admission Living Arrangement: Home Anticipated Discharge Location: Home Barriers to Discharge: none Dispo: Anticipated discharge today.   Virl Axe, MD 07/23/2020, 10:26 AM Pager: [phone] After 5pm on weekdays and 1pm on weekends: On Call pager [phone]

## 2020-07-23 NOTE — Discharge Summary (Addendum)
Name: Richard [name] MRN: [id number] DOB: 1960-11-30 59 y.o. PCP: [name], DO  Date of Admission: 07/19/2020  4:45 PM Date of Discharge: 07/23/2020 Attending Physician: Lenice Pressman, MD, PhD   Discharge Diagnosis: 1. Nonpurulent cellulitis of left lower extremitiy  Discharge Medications: Allergies as of 07/23/2020   No Known Allergies      Medication List     TAKE these medications    Accu-Chek FastClix Lancets Misc Check blood sugar two times a day   Accu-Chek Guide w/Device Kit 1 each by Does not apply route 2 (two) times daily.   albuterol (2.5 MG/3ML) 0.083% nebulizer solution Commonly known as: PROVENTIL Take 3 mLs (2.5 mg total) by nebulization every 6 (six) hours as needed for wheezing or shortness of breath.   albuterol 108 (90 Base) MCG/ACT inhaler Commonly known as: ProAir HFA Inhale 2 puffs into the lungs every 4 (four) hours as needed for wheezing or shortness of breath.   amoxicillin-clavulanate 875-125 MG tablet Commonly known as: Augmentin Take 1 tablet by mouth 2 (two) times daily for 14 days.   aspirin 81 MG EC tablet Take 1 tablet (81 mg total) by mouth daily.   atorvastatin 40 MG tablet Commonly known as: Lipitor Take 1 tablet (40 mg total) by mouth daily.   diclofenac sodium 1 % Gel Commonly known as: VOLTAREN Apply 4 g topically 4 (four) times daily as needed.   doxycycline 100 MG capsule Commonly known as: VIBRAMYCIN Take 1 capsule (100 mg total) by mouth 2 (two) times daily.   Dulera 200-5 MCG/ACT Aero Generic drug: mometasone-formoterol INHALE 2 PUFFS INTO THE LUNGS TWICE DAILY What changed: when to take this   glucose blood test strip Commonly known as: Accu-Chek Guide Check blood sugar two times a day   ibuprofen 600 MG tablet Commonly known as: ADVIL Take 1 tablet (600 mg total) by mouth every 6 (six) hours as needed.   latanoprost 0.005 % ophthalmic solution Commonly known as: XALATAN Place 1 drop into  both eyes at bedtime.   levocetirizine 5 MG tablet Commonly known as: XYZAL Take 1 tablet (5 mg total) by mouth every evening.   methocarbamol 500 MG tablet Commonly known as: ROBAXIN Take 2 tablets (1,000 mg total) by mouth every 8 (eight) hours as needed for muscle spasms.   montelukast 10 MG tablet Commonly known as: SINGULAIR Take 1 tablet (10 mg total) by mouth at bedtime.   MULTIVITAMIN ADULT PO Take 1 tablet by mouth daily.   nystatin powder Commonly known as: MYCOSTATIN/NYSTOP Apply between 4th and 5th digits of both feet once daily. What changed:  how much to take how to take this when to take this   pioglitazone 30 MG tablet Commonly known as: ACTOS Take 1 tablet (30 mg total) by mouth daily.   pregabalin 100 MG capsule Commonly known as: LYRICA Take 1 capsule (100 mg total) by mouth 3 (three) times daily.   Synjardy 02-999 MG Tabs Generic drug: Empagliflozin-metFORMIN HCl Take 1 tablet by mouth 2 (two) times daily.   timolol 0.5 % ophthalmic solution Commonly known as: TIMOPTIC Place 1 drop into both eyes 2 (two) times daily.   triamcinolone ointment 0.1 % Commonly known as: KENALOG Apply 1 application topically 2 (two) times daily as needed.   Trulicity 1.5 VE/7.2CN Sopn Generic drug: Dulaglutide Inject 1.5 mg into the skin once a week.   Unistik 2 Normal Misc Check blood sugar two times a day   Vyzulta 0.024 % Soln Generic  drug: Latanoprostene Bunod Apply 1 drop to eye at bedtime.        Disposition and follow-up:   Richard [name] was discharged from [facility] in Stable condition.  At the hospital follow up visit please address:  1.  Nonpurulent cellulitis of LLE. Will finish abx therapy with 6-day course of oral doxycycline and augmentin. F/u in [facility] on 07/28/20 or 07/29/20 for hospital f/u.  2.  Labs / imaging needed at time of follow-up: cbc, bmp  3.  Pending labs/ test needing follow-up:   Follow-up  Appointments:  Follow-up Information     [facility]. Call.   Why: concerning delivery of walker Contact information: [place] by problem list: 1. Patient presented with erythema, tenderness, and edema of left lower extremity, as well as fever of 100.5. Noted to have a non-draining ulcer on the plantar surface of the left foot. He does not recall any injury, but does have diabetic neuropathy so an unnoticed puncture injury is possible. MRI of left foot and leg did not reveal underlying osteomyelitis or abscess. Received 3 days of cefazolin, then switched to vanc/ceftriaxone/flagyl for broader abx coverage as he had expanding cellulitis was not improving. On day 2 of vanc/ceftriaxone/flagyl, patient reportedly feeling a lot better with significant improvement of cellulitis (leg less edematous and minimal tenderness to palpation). Will discharge with a 6-day course of oral doxycycline 146m BID and augmentin 875-125 BID. Will have patient follow up at Internal Medicine Clinic this Thursday or Friday to reevaluate cellulitis.  T2DM - stable Restart empagoflozin-metformin 5-10047mBID, pioglitazone 3050maily, dulaglutide injection weekly  HLD - stable Restart atorvastatin 37m70mily, aspirin 81mg60mly  Asthma/COPD - stable Restart montelukast 10mg,26mocetirizine 5mg, a57mterol nebulizer  Discharge Vitals:   BP 131/79 (BP Location: Left Arm)    Pulse 88    Temp 99.2 F (37.3 C) (Oral)    Resp 18    Ht _0  (1.905 m)    Wt 105.7 kg    SpO2 90%    BMI 29.13 kg/m   Pertinent Labs, Studies, and Procedures:  Component     Latest Ref Rng & Units 07/21/2020 07/22/2020 07/23/2020  WBC     4.0 - 10.5 K/uL 7.4 7.2 8.4  RBC     4.22 - 5.81 MIL/uL 4.48 4.31 4.51  Hemoglobin     13.0 - 17.0 g/dL 11.7 (L) 11.1 (L) 11.5 (L)  HCT     39 - 52 % 37.2 (L) 35.9 (L) 37.1 (L)  MCV     80.0 - 100.0 fL 83.0 83.3 82.3  MCH     26.0 - 34.0 pg 26.1 25.8 (L)  25.5 (L)  MCHC     30.0 - 36.0 g/dL 31.5 30.9 31.0  RDW     11.5 - 15.5 % 14.0 13.7 13.6  Platelets     150 - 400 K/uL 256 277 310  nRBC     0.0 - 0.2 % 0.0 0.0 0.0  Neutrophils     %     NEUT#     1.7 - 7.7 K/uL     Lymphocytes     %     Lymphocyte #     0.7 - 4.0 K/uL     Monocytes Relative     %     Monocyte #     0.1 - 1.0 K/uL  Eosinophil     %     Eosinophils Absolute     0.0 - 0.5 K/uL     Basophil     %     Basophils Absolute     0.0 - 0.1 K/uL     Immature Granulocytes     %     Abs Immature Granulocytes     0.00 - 0.07 K/uL     Lymphs     Not Estab. %     Monocytes     Not Estab. %     Eos     Not Estab. %     Basos     Not Estab. %     Monocytes Absolute     0 - 0 x10E3/uL     EOS (ABSOLUTE)     0.0 - 0.4 x10E3/uL     Immature Grans (Abs)     0.0 - 0.1 x10E3/uL     MPV     7.5 - 12.5 fL     WBC mixed population     200 - 950 cells/uL     Total Lymphocyte     %     TIBC     250 - 450 ug/dL     UIBC     111 - 343 ug/dL     Iron     38 - 169 ug/dL     Iron Saturation     15 - 55 %     Vitamin B12     211 - 946 pg/mL     Folate, Hemolysate     Not Estab. ng/mL     Folate, RBC     >498 ng/mL     Ferritin     30.0 - 400.0 ng/mL     Retic Ct Pct     0.6 - 2.6 %       Component     Latest Ref Rng & Units 07/21/2020 07/22/2020 07/23/2020            Sodium     135 - 145 mmol/L 138 136 135  Potassium     3.5 - 5.1 mmol/L 4.5 4.1 4.5  Chloride     98 - 111 mmol/L 106 103 102  CO2     22 - 32 mmol/L _0 Glucose     70 - 99 mg/dL 144 (H) 189 (H) 179 (H)  BUN     8 - 23 mg/dL _1 Creatinine     0.61 - 1.24 mg/dL 0.82 0.86 0.81  Calcium     8.9 - 10.3 mg/dL 8.6 (L) 8.5 (L) 8.7 (L)  GFR, Estimated     >60 mL/min >60 >60 >60  Anion gap     5 - _2 Erythrocyte Sedimentation Rate     Component Value Date/Time   ESRSEDRATE 50 (H) 07/20/2020 0029   Blood Culture    Component Value Date/Time   SDES  BLOOD SITE NOT SPECIFIED 07/19/2020 2015   SPECREQUEST  07/19/2020 2015    BOTTLES DRAWN AEROBIC AND ANAEROBIC Blood Culture adequate volume   CULT  07/19/2020 2015    NO GROWTH 5 DAYS Performed at [facility], [place] [address] [place], [place] [address]    REPTSTATUS 07/24/2020 FINAL 07/19/2020 2015     Discharge Instructions: Discharge Instructions     Call MD for:  difficulty breathing, headache or visual disturbances   Complete  by: As directed    Call MD for:  extreme fatigue   Complete by: As directed    Call MD for:  hives   Complete by: As directed    Call MD for:  persistant dizziness or light-headedness   Complete by: As directed    Call MD for:  persistant nausea and vomiting   Complete by: As directed    Call MD for:  redness, tenderness, or signs of infection (pain, swelling, redness, odor or green/yellow discharge around incision site)   Complete by: As directed    Call MD for:  severe uncontrolled pain   Complete by: As directed    Call MD for:  temperature >100.4   Complete by: As directed    Diet - low sodium heart healthy   Complete by: As directed    Discharge instructions   Complete by: As directed    Richard [name], it was a pleasure taking care of you during your time here. You came in with a left leg skin infection that we treated with antibiotics. We will be sending you home with two antibiotics that you need to take for 6 days as directed.  The antibiotics are as follows: Amoxicillin-Clavulanate (take 2 times a day as directed) and Doxycycline (take 2 times a day as directed).  We have scheduled a follow up appointment at the Internal Medicine Clinic. It will be either this upcoming Thursday or Friday (you will receive a phone call to set up the appointment). Please go to this appointment so that we can make sure your infection is getting better with the antibiotics we are sending you home with.  Once again, it was a pleasure taking care you during  your time here.   Increase activity slowly   Complete by: As directed    No wound care   Complete by: As directed        Signed: Virl Axe, MD 07/25/2020, 5:14 PM   Pager: [phone]

## 2020-07-23 NOTE — Plan of Care (Signed)
  Problem: Health Behavior/Discharge Planning: °Goal: Ability to manage health-related needs will improve °Outcome: Adequate for Discharge °  °Problem: Clinical Measurements: °Goal: Ability to maintain clinical measurements within normal limits will improve °Outcome: Adequate for Discharge °Goal: Will remain free from infection °Outcome: Adequate for Discharge °Goal: Diagnostic test results will improve °Outcome: Adequate for Discharge °Goal: Respiratory complications will improve °Outcome: Adequate for Discharge °Goal: Cardiovascular complication will be avoided °Outcome: Adequate for Discharge °  °Problem: Activity: °Goal: Risk for activity intolerance will decrease °Outcome: Adequate for Discharge °  °Problem: Nutrition: °Goal: Adequate nutrition will be maintained °Outcome: Adequate for Discharge °  °Problem: Coping: °Goal: Level of anxiety will decrease °Outcome: Adequate for Discharge °  °Problem: Elimination: °Goal: Will not experience complications related to bowel motility °Outcome: Adequate for Discharge °Goal: Will not experience complications related to urinary retention °Outcome: Adequate for Discharge °  °Problem: Pain Managment: °Goal: General experience of comfort will improve °Outcome: Adequate for Discharge °  °Problem: Safety: °Goal: Ability to remain free from injury will improve °Outcome: Adequate for Discharge °  °Problem: Skin Integrity: °Goal: Risk for impaired skin integrity will decrease °Outcome: Adequate for Discharge °  °Problem: Clinical Measurements: °Goal: Ability to avoid or minimize complications of infection will improve °Outcome: Adequate for Discharge °  °Problem: Skin Integrity: °Goal: Skin integrity will improve °Outcome: Adequate for Discharge °  °

## 2020-07-23 NOTE — TOC Transition Note (Signed)
Transition of Care [facility]) - CM/SW Discharge Note   Patient Details  Name: Richard [name] MRN: [id number] Date of Birth: 11-Sep-1961  Transition of Care [facility]) CM/SW Contact:  Bartholomew Crews, RN Phone Number: [phone] 07/23/2020, 2:15 PM   Clinical Narrative:     Notified by nursing of patient need for walker. Noted PT recommendations for walker. Spoke with patient on this room phone. He states that his ride is on the way and would prefer to not wait for delivery to room. He is agreeable to walker being delivered to his home late next week. DME order placed and referral sent to [facility] for delivery to home. Demographics verified. Patient requested text message with contact information for [employer]. No further TOC needs identified.   Final next level of care: Home/Self Care Barriers to Discharge: No Barriers Identified   Patient Goals and CMS Choice Patient states their goals for this hospitalization and ongoing recovery are:: return home CMS Medicare.gov Compare Post Acute Care list provided to:: Patient Choice offered to / list presented to : Patient  Discharge Placement                       Discharge Plan and Services                DME Arranged: Gilford Rile   Date DME Agency Contacted: 07/23/20 Time DME Agency Contacted: [id number] Representative spoke with at DME Agency: Jodell Cipro HH Arranged: NA [place] Agency: NA        Social Determinants of Health ([place]) Interventions     Readmission Risk Interventions No flowsheet data found.

## 2020-07-23 NOTE — Plan of Care (Signed)
  Problem: Health Behavior/Discharge Planning: Goal: Ability to manage health-related needs will improve 07/23/2020 1357 by Paulla Fore, RN Outcome: Completed/Met 07/23/2020 1356 by Paulla Fore, RN Outcome: Adequate for Discharge   Problem: Clinical Measurements: Goal: Ability to maintain clinical measurements within normal limits will improve 07/23/2020 1357 by Paulla Fore, RN Outcome: Completed/Met 07/23/2020 1356 by Paulla Fore, RN Outcome: Adequate for Discharge Goal: Will remain free from infection 07/23/2020 1357 by Paulla Fore, RN Outcome: Completed/Met 07/23/2020 1356 by Paulla Fore, RN Outcome: Adequate for Discharge Goal: Diagnostic test results will improve 07/23/2020 1357 by Paulla Fore, RN Outcome: Completed/Met 07/23/2020 1356 by Paulla Fore, RN Outcome: Adequate for Discharge Goal: Respiratory complications will improve 07/23/2020 1357 by Paulla Fore, RN Outcome: Completed/Met 07/23/2020 1356 by Paulla Fore, RN Outcome: Adequate for Discharge Goal: Cardiovascular complication will be avoided 07/23/2020 1357 by Paulla Fore, RN Outcome: Completed/Met 07/23/2020 1356 by Paulla Fore, RN Outcome: Adequate for Discharge   Problem: Activity: Goal: Risk for activity intolerance will decrease 07/23/2020 1357 by Paulla Fore, RN Outcome: Completed/Met 07/23/2020 1356 by Paulla Fore, RN Outcome: Adequate for Discharge   Problem: Nutrition: Goal: Adequate nutrition will be maintained 07/23/2020 1357 by Paulla Fore, RN Outcome: Completed/Met 07/23/2020 1356 by Paulla Fore, RN Outcome: Adequate for Discharge   Problem: Coping: Goal: Level of anxiety will decrease 07/23/2020 1357 by Paulla Fore, RN Outcome: Completed/Met 07/23/2020 1356 by Paulla Fore, RN Outcome: Adequate for Discharge   Problem: Elimination: Goal: Will not experience  complications related to bowel motility 07/23/2020 1357 by Paulla Fore, RN Outcome: Completed/Met 07/23/2020 1356 by Paulla Fore, RN Outcome: Adequate for Discharge Goal: Will not experience complications related to urinary retention 07/23/2020 1357 by Paulla Fore, RN Outcome: Completed/Met 07/23/2020 1356 by Paulla Fore, RN Outcome: Adequate for Discharge   Problem: Pain Managment: Goal: General experience of comfort will improve 07/23/2020 1357 by Paulla Fore, RN Outcome: Completed/Met 07/23/2020 1356 by Paulla Fore, RN Outcome: Adequate for Discharge   Problem: Safety: Goal: Ability to remain free from injury will improve 07/23/2020 1357 by Paulla Fore, RN Outcome: Completed/Met 07/23/2020 1356 by Paulla Fore, RN Outcome: Adequate for Discharge   Problem: Skin Integrity: Goal: Risk for impaired skin integrity will decrease 07/23/2020 1357 by Paulla Fore, RN Outcome: Completed/Met 07/23/2020 1356 by Paulla Fore, RN Outcome: Adequate for Discharge   Problem: Clinical Measurements: Goal: Ability to avoid or minimize complications of infection will improve 07/23/2020 1357 by Paulla Fore, RN Outcome: Completed/Met 07/23/2020 1356 by Paulla Fore, RN Outcome: Adequate for Discharge   Problem: Skin Integrity: Goal: Skin integrity will improve 07/23/2020 1357 by Paulla Fore, RN Outcome: Completed/Met 07/23/2020 1356 by Paulla Fore, RN Outcome: Adequate for Discharge

## 2020-07-23 NOTE — Progress Notes (Addendum)
PT Cancellation Note  Patient Details Name: Richard [name] MRN: [id number] DOB: 11-Feb-1961   Cancelled Treatment:    Reason Eval/Treat Not Completed: Patient declined, no reason specified.  Pt declining treatment, stating that he is moving much better now that his swelling and pain in controlled. He does not wish to perform stair training again. Pt reports he has a set of crutches at home and would feel more comfortable with a RW vs the crutches. Updated equipment recs to include RW and discussed with Mickey Farber, PT. Pt is expected to d/c home today.   Benjiman Core, PTA Pager [phone] Acute Rehab  Allena Katz 07/23/2020, 1:51 PM  I have discussed the pt's mobility and equipment needs with the PTA and agree with changing d/c recommendations at this time.   Hardie Pulley, DPT   Acute Rehabilitation Department Pager #: [phone]

## 2020-07-23 NOTE — Progress Notes (Addendum)
DISCHARGE NOTE HOME Kit Mollett to be discharged Home per MD order. Discussed prescriptions and follow up appointments with the patient. Prescriptions given to patient; medication list explained in detail. Patient verbalized understanding.  Skin clean, dry and intact without evidence of skin break down, no evidence of skin tears noted. IV catheter discontinued intact. Site without signs and symptoms of complications. Dressing and pressure applied. Pt denies pain at the site currently. No complaints noted.  Patient free of lines. Patient has wound on sole of left foot. Wound was dressed and pt was educated on wound care.  An After Visit Summary (AVS) was printed and given to the patient. Patient escorted via wheelchair, and discharged home via private auto.  Paulla Fore, RN, BSN

## 2020-07-24 LAB — CULTURE, BLOOD (ROUTINE X 2)
Culture: NO GROWTH
Special Requests: ADEQUATE

## 2020-07-25 ENCOUNTER — Telehealth: Payer: Self-pay

## 2020-07-25 ENCOUNTER — Inpatient Hospital Stay (HOSPITAL_COMMUNITY)
Admission: EM | Admit: 2020-07-25 | Discharge: 2020-08-01 | DRG: 617 | Disposition: A | Discharge status: Home Health | Payer: Medicare Other | Attending: Internal Medicine | Admitting: Internal Medicine

## 2020-07-25 ENCOUNTER — Ambulatory Visit (INDEPENDENT_AMBULATORY_CARE_PROVIDER_SITE_OTHER): Payer: Medicare Other | Admitting: Student

## 2020-07-25 ENCOUNTER — Encounter: Payer: Self-pay | Admitting: Student

## 2020-07-25 ENCOUNTER — Inpatient Hospital Stay: Admission: AD | Admit: 2020-07-25 | Payer: Medicare Other | Source: Ambulatory Visit | Admitting: Internal Medicine

## 2020-07-25 ENCOUNTER — Emergency Department (HOSPITAL_COMMUNITY): Payer: Medicare Other

## 2020-07-25 VITALS — BP 137/67 | HR 101 | Temp 98.7°F

## 2020-07-25 DIAGNOSIS — I739 Peripheral vascular disease, unspecified: Secondary | ICD-10-CM | POA: Diagnosis present

## 2020-07-25 DIAGNOSIS — E1139 Type 2 diabetes mellitus with other diabetic ophthalmic complication: Secondary | ICD-10-CM | POA: Diagnosis present

## 2020-07-25 DIAGNOSIS — L03116 Cellulitis of left lower limb: Secondary | ICD-10-CM | POA: Diagnosis present

## 2020-07-25 DIAGNOSIS — E1169 Type 2 diabetes mellitus with other specified complication: Principal | ICD-10-CM | POA: Diagnosis present

## 2020-07-25 DIAGNOSIS — J4489 Other specified chronic obstructive pulmonary disease: Secondary | ICD-10-CM

## 2020-07-25 DIAGNOSIS — E1142 Type 2 diabetes mellitus with diabetic polyneuropathy: Secondary | ICD-10-CM | POA: Diagnosis present

## 2020-07-25 DIAGNOSIS — I10 Essential (primary) hypertension: Secondary | ICD-10-CM | POA: Diagnosis present

## 2020-07-25 DIAGNOSIS — Z7951 Long term (current) use of inhaled steroids: Secondary | ICD-10-CM

## 2020-07-25 DIAGNOSIS — M869 Osteomyelitis, unspecified: Secondary | ICD-10-CM | POA: Diagnosis present

## 2020-07-25 DIAGNOSIS — Z20822 Contact with and (suspected) exposure to covid-19: Secondary | ICD-10-CM | POA: Diagnosis present

## 2020-07-25 DIAGNOSIS — J449 Chronic obstructive pulmonary disease, unspecified: Secondary | ICD-10-CM | POA: Diagnosis not present

## 2020-07-25 DIAGNOSIS — Z79899 Other long term (current) drug therapy: Secondary | ICD-10-CM

## 2020-07-25 DIAGNOSIS — Z7984 Long term (current) use of oral hypoglycemic drugs: Secondary | ICD-10-CM

## 2020-07-25 DIAGNOSIS — R072 Precordial pain: Secondary | ICD-10-CM

## 2020-07-25 DIAGNOSIS — E11621 Type 2 diabetes mellitus with foot ulcer: Secondary | ICD-10-CM | POA: Diagnosis present

## 2020-07-25 DIAGNOSIS — E1140 Type 2 diabetes mellitus with diabetic neuropathy, unspecified: Secondary | ICD-10-CM | POA: Diagnosis present

## 2020-07-25 DIAGNOSIS — E1151 Type 2 diabetes mellitus with diabetic peripheral angiopathy without gangrene: Secondary | ICD-10-CM | POA: Diagnosis present

## 2020-07-25 DIAGNOSIS — T17908A Unspecified foreign body in respiratory tract, part unspecified causing other injury, initial encounter: Secondary | ICD-10-CM

## 2020-07-25 DIAGNOSIS — B9562 Methicillin resistant Staphylococcus aureus infection as the cause of diseases classified elsewhere: Secondary | ICD-10-CM | POA: Diagnosis present

## 2020-07-25 DIAGNOSIS — Z7982 Long term (current) use of aspirin: Secondary | ICD-10-CM

## 2020-07-25 DIAGNOSIS — L02612 Cutaneous abscess of left foot: Secondary | ICD-10-CM

## 2020-07-25 DIAGNOSIS — E1165 Type 2 diabetes mellitus with hyperglycemia: Secondary | ICD-10-CM | POA: Diagnosis present

## 2020-07-25 DIAGNOSIS — M86272 Subacute osteomyelitis, left ankle and foot: Secondary | ICD-10-CM | POA: Diagnosis present

## 2020-07-25 DIAGNOSIS — E785 Hyperlipidemia, unspecified: Secondary | ICD-10-CM | POA: Diagnosis present

## 2020-07-25 DIAGNOSIS — F17210 Nicotine dependence, cigarettes, uncomplicated: Secondary | ICD-10-CM | POA: Diagnosis present

## 2020-07-25 DIAGNOSIS — L97529 Non-pressure chronic ulcer of other part of left foot with unspecified severity: Secondary | ICD-10-CM | POA: Diagnosis present

## 2020-07-25 LAB — CBC WITH DIFFERENTIAL/PLATELET
Abs Immature Granulocytes: 0.28 10*3/uL — ABNORMAL HIGH (ref 0.00–0.07)
Basophils Absolute: 0.1 10*3/uL (ref 0.0–0.1)
Basophils Relative: 1 %
Eosinophils Absolute: 0.5 10*3/uL (ref 0.0–0.5)
Eosinophils Relative: 4 %
HCT: 41.1 % (ref 39.0–52.0)
Hemoglobin: 12.4 g/dL — ABNORMAL LOW (ref 13.0–17.0)
Immature Granulocytes: 2 %
Lymphocytes Relative: 8 %
Lymphs Abs: 1 10*3/uL (ref 0.7–4.0)
MCH: 25.5 pg — ABNORMAL LOW (ref 26.0–34.0)
MCHC: 30.2 g/dL (ref 30.0–36.0)
MCV: 84.4 fL (ref 80.0–100.0)
Monocytes Absolute: 1.2 10*3/uL — ABNORMAL HIGH (ref 0.1–1.0)
Monocytes Relative: 9 %
Neutro Abs: 10.2 10*3/uL — ABNORMAL HIGH (ref 1.7–7.7)
Neutrophils Relative %: 76 %
Platelets: 402 10*3/uL — ABNORMAL HIGH (ref 150–400)
RBC: 4.87 MIL/uL (ref 4.22–5.81)
RDW: 13.5 % (ref 11.5–15.5)
WBC: 13.3 10*3/uL — ABNORMAL HIGH (ref 4.0–10.5)
nRBC: 0 % (ref 0.0–0.2)

## 2020-07-25 LAB — COMPREHENSIVE METABOLIC PANEL
ALT: 21 U/L (ref 0–44)
AST: 19 U/L (ref 15–41)
Albumin: 3.2 g/dL — ABNORMAL LOW (ref 3.5–5.0)
Alkaline Phosphatase: 129 U/L — ABNORMAL HIGH (ref 38–126)
Anion gap: 14 (ref 5–15)
BUN: 15 mg/dL (ref 8–23)
CO2: 25 mmol/L (ref 22–32)
Calcium: 9.4 mg/dL (ref 8.9–10.3)
Chloride: 99 mmol/L (ref 98–111)
Creatinine, Ser: 1.1 mg/dL (ref 0.61–1.24)
GFR, Estimated: >60 mL/min (ref >60)
Glucose, Bld: 126 mg/dL — ABNORMAL HIGH (ref 70–99)
Potassium: 4.8 mmol/L (ref 3.5–5.1)
Sodium: 138 mmol/L (ref 135–145)
Total Bilirubin: 0.6 mg/dL (ref 0.3–1.2)
Total Protein: 8.2 g/dL — ABNORMAL HIGH (ref 6.5–8.1)

## 2020-07-25 LAB — LACTIC ACID, PLASMA: Lactic Acid, Venous: 1 mmol/L (ref 0.5–1.9)

## 2020-07-25 MED ORDER — OXYCODONE-ACETAMINOPHEN 5-325 MG PO TABS
1.0000 | ORAL_TABLET | Freq: Once | ORAL | Status: AC
  Administered 2020-07-25: 1 via ORAL
  Filled 2020-07-25: qty 1

## 2020-07-25 NOTE — Assessment & Plan Note (Signed)
Patient presented to the ED last week with chest pain. Work-up was negative for ACS. Patient states his pain improve during his hospitalization. He has been taking ibuprofen at home with some relief however he still has some mild chest pain.  Patient is advised to go to the ED if symptoms get worse.

## 2020-07-25 NOTE — Patient Instructions (Signed)
Thank you, [name] for allowing [place] to provide your care today. Today we discussed left foot wound.  If wound has not been healing well is starting to have pus in it so we want to get treatment right away. We tried to directly admit you to the hospital however there are no beds available at the moment.  We are sending you to the emergency room so they can start treatment and get you admitted to the hospital for IV antibiotics. Please be patient as emergency room has currently for her as well so you will have to wait for some time before you do get you.   Should you have any questions or concerns please call the internal medicine clinic at [phone].    Linwood Dibbles, MD, MPH [facility]   My Chart Access: [email or web address]   If you have not already done so, please get your COVID 19 vaccine  To schedule an appointment for a COVID vaccine choice any of the following: Go to [email or web address]   Go to [email or web address]                  Call [phone]                                     Call [phone] and select Option 2

## 2020-07-25 NOTE — Assessment & Plan Note (Addendum)
Patient presented to clinic for hospital follow-up after being treated for left foot cellulitis from 10/12 through 10/16.  MRI was negative for osteomyelitis. Patient was discharged with Augmentin and doxy. He reports being compliant with his medications. Patient was advised to not put pressure on his foot however patient states he has not had any help at home and has been putting pressure on his foot to do his ADLs since he left the hospital. He has had difficulty taking care of his wound at home. He continues to endorse moderate left lower extremity pain with minimal relief with ibuprofen. States his chest pain is now better but is still there. He denies any fever, chills, headaches, shortness of breath or palpitations. On exam, LLE w/ tenderness to palpation, warmth and swelling. Left foot wound with erythema, warmth and purulent drainage. Patient's cellulitis is currently unresponsive to oral abx and in the setting of patient's diabetic neuropathy, would send patient to the ED to get further treatment and admission to the hospital and avoid worsening infection.   Plan: --Continue doxy and augmentin --Sending patient to the ED, patient aware of wait long wait time. --Patient will require Home health w/ PT/RN/aide when d/c from hospital.

## 2020-07-25 NOTE — H&P (Signed)
Date: 07/26/2020               Patient Name:  Richard [name] MRN: [id number]  DOB: [birth year] Age / Sex: 59 y.o., male   PCP: [name], DO         Medical Service: Internal Medicine Teaching Service         Attending Physician: Dr. Lenice Pressman    First Contact: Dr. Allyson Sabal Pager: [phone]  Second Contact: Dr. Court Joy Pager: [phone]       After Hours (After 5p/  First Contact Pager: [phone]  weekends / holidays): Second Contact Pager: [phone]   Chief Complaint: foot pain  History of Present Illness:   Richard [name] is a 59 y.o. year old male with hx of diabetes mellitus with neuropathy, asthma-COPD overlap, hypertension, hyperlipidemia presenting for increasing L foot pain. He was admitted from 10/12-10-16 for this same cellulitis. Was started on IV Ancef, then broadened due to increasing pain and swelling to vanc+ceftriaxone+metronidazole, then switched to oral Augmentin and doxycycline on discharge. Has been compliant with his Abx. MRI negative for osteo on 10/13. Unfortunately has had increasing pain and swelling since discharge, now notes occasional feeling like his leg is on fire. Denies fever, chills, CP, SOB, passing out.  Meds:  Current Outpatient Medications  Medication Instructions  . ACCU-CHEK FASTCLIX LANCETS MISC Check blood sugar two times a day  . albuterol (PROAIR HFA) 108 (90 Base) MCG/ACT inhaler 2 puffs, Inhalation, Every 4 hours PRN  . albuterol (PROVENTIL) 2.5 mg, Nebulization, Every 6 hours PRN  . amoxicillin-clavulanate (AUGMENTIN) 875-125 MG tablet 1 tablet, Oral, 2 times daily  . aspirin 81 mg, Oral, Daily  . atorvastatin (LIPITOR) 40 mg, Oral, Daily  . Blood Glucose Monitoring Suppl (ACCU-CHEK GUIDE) w/Device KIT 1 each, Does not apply, 2 times daily  . diclofenac sodium (VOLTAREN) 4 g, Topical, 4 times daily PRN  . doxycycline (VIBRAMYCIN) 100 mg, Oral, 2 times daily  . DULERA 200-5 MCG/ACT AERO INHALE 2 PUFFS INTO THE LUNGS TWICE  DAILY  . Empagliflozin-metFORMIN HCl (SYNJARDY) 02-999 MG TABS 1 tablet, Oral, 2 times daily  . glucose blood (ACCU-CHEK GUIDE) test strip Check blood sugar two times a day  . ibuprofen (ADVIL) 600 mg, Oral, Every 6 hours PRN  . Lancets Misc. (UNISTIK 2 NORMAL) MISC Check blood sugar two times a day  . latanoprost (XALATAN) 0.005 % ophthalmic solution 1 drop, Both Eyes, Daily at bedtime  . levocetirizine (XYZAL) 5 mg, Oral, Every evening  . methocarbamol (ROBAXIN) 1,000 mg, Oral, Every 8 hours PRN  . montelukast (SINGULAIR) 10 mg, Oral, Daily at bedtime  . Multiple Vitamins-Minerals (MULTIVITAMIN ADULT PO) 1 tablet, Oral, Daily  . nystatin (MYCOSTATIN/NYSTOP) powder Apply between 4th and 5th digits of both feet once daily.  . pioglitazone (ACTOS) 30 mg, Oral, Daily  . pregabalin (LYRICA) 100 mg, Oral, 3 times daily  . timolol (TIMOPTIC) 0.5 % ophthalmic solution 1 drop, Both Eyes, 2 times daily  . triamcinolone ointment (KENALOG) 0.1 % 1 application, Topical, 2 times daily PRN  . Trulicity 1.5 mg, Subcutaneous, Weekly  . VYZULTA 0.024 % SOLN 1 drop, Ophthalmic, Daily at bedtime     Allergies: Allergies as of 07/25/2020  . (No Known Allergies)   Past Medical History:  Diagnosis Date  . Asthma   . Chronic bronchitis ([place])   . Clostridium difficile colitis 09/09/2014  . COPD (chronic obstructive pulmonary disease) ([place])   . Eczema   . Essential hypertension   .  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 Hyperlipidemia   . Neuromuscular disorder (HCC)    neuropathy in feet  . 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 Substance abuse ([place])    crack - recovered x 30 yrs  . Type II diabetes mellitus ([place])    diagnosed in 2013    Family History:  Family History  Problem Relation Age of Onset  . Hypertension Mother   . Hypertension Sister   . Glaucoma Sister   . Colon cancer Neg Hx   . Colon polyps Neg Hx   . Esophageal cancer Neg Hx   . Rectal cancer Neg Hx   . Stomach cancer  Neg Hx     Social History:  Social History   Tobacco Use  . Smoking status: Smoker, Current Status Unknown    Packs/day: 0.50    Years: 45.00    Pack years: 22.50    Types: Cigarettes    Last attempt to quit: 01/12/2018    Years since quitting: 2.5  . Smokeless tobacco: Never Used  Vaping Use  . Vaping Use: Never used  Substance Use Topics  . Alcohol use: Not Currently    Alcohol/week: 0.0 standard drinks    Comment: 08/24/2019 "nothing since <2010"  . Drug use: Not Currently    Types: Cocaine    Comment: 10/23/2018 "nothing since <1990"    Review of Systems: Review of Systems  Constitutional: Negative for chills and fever.  HENT: Negative for congestion and sore throat.   Eyes: Negative for blurred vision and double vision.  Respiratory: Negative for cough and shortness of breath.   Cardiovascular: Negative for chest pain and palpitations.  Gastrointestinal: Negative for abdominal pain, nausea and vomiting.  Genitourinary: Negative for dysuria and frequency.  Musculoskeletal: Positive for myalgias. Negative for falls.  Skin: Negative for itching and rash.       Pain, swelling, and erythema of LLE  Neurological: Negative for dizziness and loss of consciousness.     Physical Exam: Physical Exam Vitals and nursing note reviewed.  Constitutional:      General: He is in acute distress.     Appearance: Normal appearance.  HENT:     Head: Normocephalic and atraumatic.     Right Ear: External ear normal.     Left Ear: External ear normal.     Nose: Nose normal.     Mouth/Throat:     Mouth: Mucous membranes are moist.  Eyes:     Extraocular Movements: Extraocular movements intact.     Conjunctiva/sclera: Conjunctivae normal.  Cardiovascular:     Rate and Rhythm: Normal rate and regular rhythm.     Pulses: Normal pulses.     Heart sounds: Murmur (1/6 systolic) heard.   Pulmonary:     Effort: Pulmonary effort is normal. No respiratory distress.     Breath sounds:  Normal breath sounds. No stridor. No wheezing, rhonchi or rales.  Abdominal:     General: Abdomen is flat.     Palpations: Abdomen is soft.     Tenderness: There is no abdominal tenderness.  Musculoskeletal:        General: Swelling and tenderness present. Normal range of motion.     Cervical back: Normal range of motion and neck supple.     Comments: Swelling and erythema of LLE from foot to mid shin Very tender Ulcer on inferior aspect of foot not significantly changed from prior, plus new skin breakdown below toes  3-5 consistent with maceration Toes have somewhat decreased sensation to light touch, motor function intact Difficult to palpate DP pulse due to swelling, but distal foot is warm and appears well perfused L calf appears significantly swollen compared to R but not tender  Skin:    General: Skin is warm and dry.  Neurological:     Mental Status: He is alert and oriented to person, place, and time.     Sensory: Sensory deficit (as described above) present.     Motor: No weakness.  Psychiatric:        Mood and Affect: Mood normal.        Behavior: Behavior normal.           EKG: personally reviewed my interpretation is sinus tachycardia, no STEMI  CXR: personally reviewed my interpretation is no acute abnormalities  Xray L foot: Possible erosion along the medial aspect of the fourth toe proximal phalanx adjacent to the PIP joint. Findings are concerning for osteomyelitis given the history. Further evaluation could be performed with MRI, as clinically indicated.  Xray foot from last admission: 1. Soft tissue swelling with no acute osseous abnormality identified. 2. Extensive calcified peripheral vascular disease.  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 year old male with hx of diabetes mellitus with neuropathy, asthma-COPD overlap, hypertension, hyperlipidemia who was recently treated here for L foot cellulitis, now presenting with increasing pain and swelling despite oral antibiotics.  Non-purulent cellulitis of L foot with likely cellulitis Diabetic foot ulcer No fever this admission, sinus tachycardia which has improved. WBC of 13.3. LA 1.0.  He was admitted from 10/12-10-16 for this same cellulitis. Was started on IV Ancef, then broadened due to increasing pain and swelling to vanc+ceftriaxone+metronidazole, then switched to oral Augmentin and doxycycline on discharge. Has been compliant with his Abx. MRI negative for osteo on 10/13. Unfortunately has had increasing pain and swelling since discharge. LLE significantly larger than R. Still has the small ulcer on the bottom of his foot, now with some maceration nearby around digits 3-5. No purulence Has good outpatient follow up with podiatry. ABI in 2019 of 1.12 on the R and 1.35 on the L. -f/u MRI -f/u LE doppler -f/u ESR and CRP -start vancomycin -daily CBC   DM type II with peripheral neuropathy Diabetic foot ulcer Last A1c of 7.2 on 07/20/20. On synjardy, pioglitazone, Trulicity, and lyrica at home. -start Lantus 10 units nightly -SSI -continue Lyrica -f/u with his podiatrist outpatient  Asthma-COPD overlap syndrome On Singulair, Xyzal, albuterol inhaler, and albuterol nebulizer at home. Reports improvement in recent weeks, but still using albuterol 3-4 times daily. -albuterol prn -continue singulair  Essential HTN No current BP meds at home besides empagliflozin. Normotensive so far this admission. -monitor  Dispo: Admit patient to Inpatient with expected length of stay greater than 2 midnights.  Signed: Andrew Au, MD 07/26/2020, 5:17 AM  Pager: [phone]  After 5pm  on weekdays and 1pm on weekends: On Call pager: [phone]

## 2020-07-25 NOTE — Telephone Encounter (Signed)
Pt is requesting for a home health nurse & needs a new nebulizer pls contact  [phone]

## 2020-07-25 NOTE — Assessment & Plan Note (Signed)
BP stable at 137/67. Currently at goal.  We will continue to monitor.

## 2020-07-25 NOTE — Telephone Encounter (Signed)
Returned call to patient. No answer. Left message on VM requesting return call. Patient does not have Medicaid so will not qualify for PCS. Per chart review, patient declined further PT while inpatient. Does not need wound care so will not be able to receive [facility] PT or RN. Will forward request for new Neb/Compressor to PCP. Hubbard Hartshorn, BSN, RN-BC

## 2020-07-25 NOTE — ED Notes (Signed)
Pt. Requesting something for pain in his foot.

## 2020-07-25 NOTE — Progress Notes (Signed)
    CC: Hospital follow up  HPI:  [name] is a 59 y.o. male with PMH COPD, HTN, HLD, DM diabetic neuropathy and a recent diagnosis of cellulitis who presents to clinic for hospital follow-up.  Please see problem based charting for evaluation, assessment and plan.  Past Medical History:  Diagnosis Date  . Asthma   . Chronic bronchitis ([place])   . Clostridium difficile colitis 09/09/2014  . COPD (chronic obstructive pulmonary disease) ([place])   . Eczema   . Essential hypertension   . 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 Hyperlipidemia   . Neuromuscular disorder (HCC)    neuropathy in feet  . 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 Substance abuse ([place])    crack - recovered x 30 yrs  . Type II diabetes mellitus ([place])    diagnosed in 2013   Review of Systems: Patient endorse left leg pain with difficulty putting weight on leg.  Patient also endorses chest pain that has improved since she went to the hospital last week. Patient denies any fever, chills, shortness of breath, palpitations, headaches or syncope.  Physical Exam:  General: Pleasant elderly male. Mild discomfort. Well nourished, well developed. Head: Normocephalic, atraumatic Cardiac: Tachycardic. Regular rhythm. I/VI Systolic murmur. No rubs or gallops.  Trace left lower extremity edema Respiratory: Lungs CTAB. No wheezing or crackles. No increased WOB Abdominal: Soft, symmetric and non tender. No organomegaly. Normal bowel sounds Skin: Warm, dry.  Extremities: Atraumatic. LLE swelling with warmth and moderate ttp of the lower leg and foot. Erythematous wound on bottom of left foot w/ purulent drainage.  Neuro: A&O x 3. Moves all extremities Psych: Appropriate mood and affect. Normal judgement  Vitals:   07/25/20 1314  BP: 137/67  Pulse: (!) 101  Temp: 98.7 F (37.1 C)  TempSrc: Oral  SpO2: 99%    Assessment & Plan:   See Encounters Tab for problem based charting.  Patient seen with Dr.  Emelia Salisbury, MD, MPH

## 2020-07-25 NOTE — Assessment & Plan Note (Signed)
Patient states his nebulizer at home broke and requested for replacement.  He denies any shortness of breath or cough at the moment.  Ordered to DME nebulizer with supplies.

## 2020-07-25 NOTE — ED Triage Notes (Signed)
Pt reports left foot infection, pt seen by PCP today and sent here to be readmitted for IV antibiotics.

## 2020-07-26 ENCOUNTER — Inpatient Hospital Stay (HOSPITAL_COMMUNITY): Payer: Medicare Other

## 2020-07-26 DIAGNOSIS — I10 Essential (primary) hypertension: Secondary | ICD-10-CM | POA: Diagnosis present

## 2020-07-26 DIAGNOSIS — E785 Hyperlipidemia, unspecified: Secondary | ICD-10-CM

## 2020-07-26 DIAGNOSIS — Z20822 Contact with and (suspected) exposure to covid-19: Secondary | ICD-10-CM | POA: Diagnosis present

## 2020-07-26 DIAGNOSIS — M351 Other overlap syndromes: Secondary | ICD-10-CM

## 2020-07-26 DIAGNOSIS — M86172 Other acute osteomyelitis, left ankle and foot: Secondary | ICD-10-CM | POA: Diagnosis not present

## 2020-07-26 DIAGNOSIS — R609 Edema, unspecified: Secondary | ICD-10-CM | POA: Diagnosis not present

## 2020-07-26 DIAGNOSIS — M869 Osteomyelitis, unspecified: Secondary | ICD-10-CM | POA: Diagnosis present

## 2020-07-26 DIAGNOSIS — I350 Nonrheumatic aortic (valve) stenosis: Secondary | ICD-10-CM | POA: Diagnosis not present

## 2020-07-26 DIAGNOSIS — L03116 Cellulitis of left lower limb: Secondary | ICD-10-CM | POA: Diagnosis present

## 2020-07-26 DIAGNOSIS — E1139 Type 2 diabetes mellitus with other diabetic ophthalmic complication: Secondary | ICD-10-CM

## 2020-07-26 DIAGNOSIS — Z7984 Long term (current) use of oral hypoglycemic drugs: Secondary | ICD-10-CM | POA: Diagnosis not present

## 2020-07-26 DIAGNOSIS — M868X7 Other osteomyelitis, ankle and foot: Secondary | ICD-10-CM | POA: Diagnosis not present

## 2020-07-26 DIAGNOSIS — E1165 Type 2 diabetes mellitus with hyperglycemia: Secondary | ICD-10-CM | POA: Diagnosis present

## 2020-07-26 DIAGNOSIS — B9561 Methicillin susceptible Staphylococcus aureus infection as the cause of diseases classified elsewhere: Secondary | ICD-10-CM | POA: Diagnosis not present

## 2020-07-26 DIAGNOSIS — E1140 Type 2 diabetes mellitus with diabetic neuropathy, unspecified: Secondary | ICD-10-CM | POA: Diagnosis not present

## 2020-07-26 DIAGNOSIS — M7989 Other specified soft tissue disorders: Secondary | ICD-10-CM | POA: Diagnosis not present

## 2020-07-26 DIAGNOSIS — M86272 Subacute osteomyelitis, left ankle and foot: Secondary | ICD-10-CM | POA: Diagnosis present

## 2020-07-26 DIAGNOSIS — E11628 Type 2 diabetes mellitus with other skin complications: Secondary | ICD-10-CM | POA: Diagnosis not present

## 2020-07-26 DIAGNOSIS — F17210 Nicotine dependence, cigarettes, uncomplicated: Secondary | ICD-10-CM | POA: Diagnosis present

## 2020-07-26 DIAGNOSIS — I342 Nonrheumatic mitral (valve) stenosis: Secondary | ICD-10-CM | POA: Diagnosis not present

## 2020-07-26 DIAGNOSIS — M79605 Pain in left leg: Secondary | ICD-10-CM | POA: Diagnosis not present

## 2020-07-26 DIAGNOSIS — I739 Peripheral vascular disease, unspecified: Secondary | ICD-10-CM | POA: Diagnosis not present

## 2020-07-26 DIAGNOSIS — E11621 Type 2 diabetes mellitus with foot ulcer: Secondary | ICD-10-CM | POA: Diagnosis present

## 2020-07-26 DIAGNOSIS — L97529 Non-pressure chronic ulcer of other part of left foot with unspecified severity: Secondary | ICD-10-CM | POA: Diagnosis present

## 2020-07-26 DIAGNOSIS — R000 Tachycardia, unspecified: Secondary | ICD-10-CM

## 2020-07-26 DIAGNOSIS — E1169 Type 2 diabetes mellitus with other specified complication: Secondary | ICD-10-CM | POA: Diagnosis present

## 2020-07-26 DIAGNOSIS — Z7982 Long term (current) use of aspirin: Secondary | ICD-10-CM | POA: Diagnosis not present

## 2020-07-26 DIAGNOSIS — Z7951 Long term (current) use of inhaled steroids: Secondary | ICD-10-CM | POA: Diagnosis not present

## 2020-07-26 DIAGNOSIS — L02612 Cutaneous abscess of left foot: Secondary | ICD-10-CM | POA: Diagnosis not present

## 2020-07-26 DIAGNOSIS — Z79899 Other long term (current) drug therapy: Secondary | ICD-10-CM | POA: Diagnosis not present

## 2020-07-26 DIAGNOSIS — J449 Chronic obstructive pulmonary disease, unspecified: Secondary | ICD-10-CM

## 2020-07-26 DIAGNOSIS — E1142 Type 2 diabetes mellitus with diabetic polyneuropathy: Secondary | ICD-10-CM | POA: Diagnosis present

## 2020-07-26 DIAGNOSIS — M791 Myalgia, unspecified site: Secondary | ICD-10-CM

## 2020-07-26 DIAGNOSIS — E1151 Type 2 diabetes mellitus with diabetic peripheral angiopathy without gangrene: Secondary | ICD-10-CM | POA: Diagnosis present

## 2020-07-26 DIAGNOSIS — B9562 Methicillin resistant Staphylococcus aureus infection as the cause of diseases classified elsewhere: Secondary | ICD-10-CM | POA: Diagnosis present

## 2020-07-26 DIAGNOSIS — L02619 Cutaneous abscess of unspecified foot: Secondary | ICD-10-CM

## 2020-07-26 DIAGNOSIS — R600 Localized edema: Secondary | ICD-10-CM

## 2020-07-26 LAB — RESPIRATORY PANEL BY RT PCR (FLU A&B, COVID)
Influenza A by PCR: NEGATIVE
Influenza B by PCR: NEGATIVE
SARS Coronavirus 2 by RT PCR: NEGATIVE

## 2020-07-26 LAB — CBG MONITORING, ED
Glucose-Capillary: 153 mg/dL — ABNORMAL HIGH (ref 70–99)
Glucose-Capillary: 135 mg/dL — ABNORMAL HIGH (ref 70–99)
Glucose-Capillary: 205 mg/dL — ABNORMAL HIGH (ref 70–99)
Glucose-Capillary: 214 mg/dL — ABNORMAL HIGH (ref 70–99)

## 2020-07-26 LAB — GLUCOSE, CAPILLARY: Glucose-Capillary: 127 mg/dL — ABNORMAL HIGH (ref 70–99)

## 2020-07-26 LAB — SEDIMENTATION RATE: Sed Rate: 95 mm/hr — ABNORMAL HIGH (ref 0–16)

## 2020-07-26 LAB — C-REACTIVE PROTEIN: CRP: 18.5 mg/dL — ABNORMAL HIGH (ref <1.0)

## 2020-07-26 MED ORDER — INSULIN GLARGINE 100 UNIT/ML ~~LOC~~ SOLN
10.0000 [IU] | Freq: Every day | SUBCUTANEOUS | Status: DC
  Administered 2020-07-26 – 2020-07-28 (×3): 10 [IU] via SUBCUTANEOUS
  Filled 2020-07-26 (×5): qty 0.1

## 2020-07-26 MED ORDER — VANCOMYCIN HCL IN DEXTROSE 1-5 GM/200ML-% IV SOLN
1000.0000 mg | Freq: Once | INTRAVENOUS | Status: AC
  Administered 2020-07-26: 1000 mg via INTRAVENOUS
  Filled 2020-07-26: qty 200

## 2020-07-26 MED ORDER — GADOBUTROL 1 MMOL/ML IV SOLN
10.0000 mL | Freq: Once | INTRAVENOUS | Status: AC | PRN
  Administered 2020-07-26: 10 mL via INTRAVENOUS

## 2020-07-26 MED ORDER — POLYETHYLENE GLYCOL 3350 17 G PO PACK: 17.0000 g | PACK | Freq: Every day | ORAL | Status: DC | PRN

## 2020-07-26 MED ORDER — MOMETASONE FURO-FORMOTEROL FUM 200-5 MCG/ACT IN AERO
2.0000 | INHALATION_SPRAY | Freq: Two times a day (BID) | RESPIRATORY_TRACT | Status: DC
  Administered 2020-07-26 – 2020-08-01 (×9): 2 via RESPIRATORY_TRACT
  Filled 2020-07-26 (×2): qty 8.8

## 2020-07-26 MED ORDER — ALBUTEROL SULFATE HFA 108 (90 BASE) MCG/ACT IN AERS
2.0000 | INHALATION_SPRAY | RESPIRATORY_TRACT | Status: DC | PRN
  Administered 2020-07-26: 2 via RESPIRATORY_TRACT
  Filled 2020-07-26: qty 6.7

## 2020-07-26 MED ORDER — SODIUM CHLORIDE 0.9 % IV SOLN
2.0000 g | Freq: Three times a day (TID) | INTRAVENOUS | Status: DC
  Administered 2020-07-26 (×2): 2 g via INTRAVENOUS
  Filled 2020-07-26 (×2): qty 2

## 2020-07-26 MED ORDER — VANCOMYCIN HCL IN DEXTROSE 1-5 GM/200ML-% IV SOLN: 1000.0000 mg | Freq: Two times a day (BID) | INTRAVENOUS | Status: DC

## 2020-07-26 MED ORDER — ENOXAPARIN SODIUM 40 MG/0.4ML ~~LOC~~ SOLN
40.0000 mg | SUBCUTANEOUS | Status: DC
  Administered 2020-07-26 – 2020-08-01 (×7): 40 mg via SUBCUTANEOUS
  Filled 2020-07-26 (×7): qty 0.4

## 2020-07-26 MED ORDER — LATANOPROST 0.005 % OP SOLN
1.0000 [drp] | Freq: Every day | OPHTHALMIC | Status: DC
  Administered 2020-07-26 – 2020-07-31 (×6): 1 [drp] via OPHTHALMIC
  Filled 2020-07-26: qty 2.5

## 2020-07-26 MED ORDER — OXYCODONE-ACETAMINOPHEN 5-325 MG PO TABS
1.0000 | ORAL_TABLET | Freq: Four times a day (QID) | ORAL | Status: DC | PRN
  Administered 2020-07-26: 2 via ORAL
  Administered 2020-07-27: 1 via ORAL
  Administered 2020-07-27: 2 via ORAL
  Filled 2020-07-26 (×2): qty 2
  Filled 2020-07-26: qty 1
  Filled 2020-07-26: qty 2

## 2020-07-26 MED ORDER — ATORVASTATIN CALCIUM 40 MG PO TABS
40.0000 mg | ORAL_TABLET | Freq: Every day | ORAL | Status: DC
  Administered 2020-07-26 – 2020-08-01 (×7): 40 mg via ORAL
  Filled 2020-07-26 (×7): qty 1

## 2020-07-26 MED ORDER — PREGABALIN 100 MG PO CAPS
100.0000 mg | ORAL_CAPSULE | Freq: Three times a day (TID) | ORAL | Status: DC
  Administered 2020-07-26 – 2020-08-01 (×17): 100 mg via ORAL
  Filled 2020-07-26 (×17): qty 1

## 2020-07-26 MED ORDER — ONDANSETRON HCL 4 MG/2ML IJ SOLN
4.0000 mg | Freq: Once | INTRAMUSCULAR | Status: AC
  Administered 2020-07-26: 4 mg via INTRAVENOUS
  Filled 2020-07-26: qty 2

## 2020-07-26 MED ORDER — ASPIRIN 81 MG PO CHEW
81.0000 mg | CHEWABLE_TABLET | Freq: Every day | ORAL | Status: DC
  Administered 2020-07-26 – 2020-08-01 (×7): 81 mg via ORAL
  Filled 2020-07-26 (×8): qty 1

## 2020-07-26 MED ORDER — SODIUM CHLORIDE 0.9 % IV SOLN
1.0000 g | Freq: Once | INTRAVENOUS | Status: DC
  Filled 2020-07-26: qty 10

## 2020-07-26 MED ORDER — MORPHINE SULFATE (PF) 4 MG/ML IV SOLN
4.0000 mg | Freq: Once | INTRAVENOUS | Status: AC
  Administered 2020-07-26: 4 mg via INTRAVENOUS
  Filled 2020-07-26: qty 1

## 2020-07-26 MED ORDER — INSULIN ASPART 100 UNIT/ML ~~LOC~~ SOLN
0.0000 [IU] | Freq: Three times a day (TID) | SUBCUTANEOUS | Status: DC
  Administered 2020-07-26: 3 [IU] via SUBCUTANEOUS
  Administered 2020-07-26: 2 [IU] via SUBCUTANEOUS
  Administered 2020-07-26: 5 [IU] via SUBCUTANEOUS
  Administered 2020-07-27: 3 [IU] via SUBCUTANEOUS
  Administered 2020-07-27: 2 [IU] via SUBCUTANEOUS
  Administered 2020-07-28: 3 [IU] via SUBCUTANEOUS
  Administered 2020-07-28: 2 [IU] via SUBCUTANEOUS
  Administered 2020-07-29 (×3): 3 [IU] via SUBCUTANEOUS
  Administered 2020-07-30: 2 [IU] via SUBCUTANEOUS
  Administered 2020-07-30: 3 [IU] via SUBCUTANEOUS
  Administered 2020-07-31: 2 [IU] via SUBCUTANEOUS
  Administered 2020-07-31: 3 [IU] via SUBCUTANEOUS
  Administered 2020-07-31: 2 [IU] via SUBCUTANEOUS
  Administered 2020-08-01 (×2): 3 [IU] via SUBCUTANEOUS

## 2020-07-26 NOTE — Progress Notes (Signed)
Pharmacy Antibiotic Note  Padraig Nhan is a 59 y.o. male admitted on 07/25/2020 with osteomyelitis.  Pharmacy has been consulted for vancomycin and cefepime dosing. Vancomycin 1gm IV given in ED  Plan: Cefepime 2gm IV q8 IV Give additional 1gm vanc now for 2gm load then 1gm IV q12 F/u renal function, cultures and clinical course  Height: 6\' 2"  (188 cm) Weight: 106.6 kg (235 lb) IBW/kg (Calculated) : 82.2  Temp (24hrs), Avg:98.6 F (37 C), Min:98.3 F (36.8 C), Max:99 F (37.2 C)  Recent Labs  Lab 07/19/20 1649 07/19/20 2015 07/20/20 0029 07/21/20 0651 07/22/20 0158 07/23/20 0559 07/25/20 1538  WBC   < >  --  9.3 7.4 7.2 8.4 13.3*  CREATININE   < >  --  1.16 0.82 0.86 0.81 1.10  LATICACIDVEN  --  0.9 1.1  --   --   --  1.0   < > = values in this interval not displayed.    Estimated Creatinine Clearance: 91.8 mL/min (by C-G formula based on SCr of 1.1 mg/dL).    No Known Allergies  Thank you for allowing pharmacy to be a part of this patient's care.  Excell Seltzer Poteet 07/26/2020 4:46 AM

## 2020-07-26 NOTE — ED Notes (Signed)
Off floor to MRI

## 2020-07-26 NOTE — ED Notes (Signed)
Spoke to pt and let him know they were going to take him to Mri soon

## 2020-07-26 NOTE — ED Notes (Signed)
Pt stated that he was having difficulty breathing and that his asthma was flaring up. Gave 2 puffs albuterol. Pt goo at 98% and is on pulse ox monitor

## 2020-07-26 NOTE — Consult Note (Signed)
[facility]    Date of Admission:  07/25/2020     Total days of antibiotics 6               Reason for Consult: Diabetic Foot Infection / Osteomyelitis  Referring Provider: Rebeca Alert Primary Care Provider: [name], DO   ASSESSMENT:  [name] is a 59 year old male with type 2 diabetes complicated by neuropathy presenting with a diabetic foot wound/abscess with x-ray and MRI imaging concerning for acute osteomyelitis.  He is clinically stable at this time.  Recommend orthopedic evaluation for possible surgical intervention and hold antibiotics to optimize for culture growth pending surgical intervention if necessary.  In the case of worsening would restart broad-spectrum coverage with vancomycin and cefepime.  May need to consider vascular evaluation with dorsalis pedis present.  Most recent A1c of 7.2.  Wound healing likely complicated by multiple co morbidities.    PLAN:  1. Hold antibiotic therapy pending surgical evaluation to optimize culture growth 2. Evaluation by orthopedics for possible surgical interventions. 3. Wound care per primary team/orthopedics. 4. Diabetes management per primary team.   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 aspirin  81 mg Oral Daily  . atorvastatin  40 mg Oral Daily  . enoxaparin (LOVENOX) injection  40 mg Subcutaneous Q24H  . insulin aspart  0-15 Units Subcutaneous TID WC  . insulin glargine  10 Units Subcutaneous QHS  . latanoprost  1 drop Both Eyes QHS  . mometasone-formoterol  2 puff Inhalation BID  . pregabalin  100 mg Oral TID     HPI: Richard [name] is a 59 y.o. male with previous medical history of asthma, COPD, hypertension, and Type 2  diabetes complicated by neuropathy presenting with worsening pain and drainage from left foot cellulitis.   Mr. Sweda was initial admitted to the hospital on 10/16 with left foot cellulitis. X-ray left foot with soft tissue swlling and no acute osseus abnormality. MRI left tibia/foot with asymmetric subcutaneous edema with the mid to distal lower leg consistent with cellulitis and no evidence of osteomyelitis; and left foot with soft tissue ulceration underlying the fourth toe at the level of the proximal phalnax. Admitted with fevers. Blood culture was without growth. Discharged home with Augmentin and doxycycline.  Mr. Besecker has noted increased pain and drainage from his left foot since discharge from the hospital. Daughter was concerned when changing his dressings that his foot had increasing drainage and that it did not look good. Taking Augmentin and doxycycline as prescribed. Left foot x-ray with possible eroision along the medial aspect of the fourth toe proximal phalanx adjacent to the PIP joint concerning for osteomyelitis. MRI left foot with soft tissue ulceration at the plantar aspect of the forefoot underlying the fourth toe.  Interval development of a slightly ill-defined fluid collection which extends from the ulcer base into the fourth interwebspace and extends across the plantar aspect of the foot underlying the proximal aspects of the second through fifth toes.  Fluid collection measuring 6.7 x 1.6 x 2.0 cm and compatible with phlegmon/developing abscess.  Findings concerning for early acute osteomyelitis.  Mr. Lofton is remained afebrile since returning to the ED.  Blood cell count of 13.3.  He is received 1  dose of vancomycin and and cefepime.  No recent fevers at home.  Foot remains tender and red.  Review of Systems: Review of Systems  Constitutional: Negative for chills, fever and weight loss.  Respiratory: Negative for cough, shortness of breath and wheezing.   Cardiovascular:  Negative for chest pain and leg swelling.  Gastrointestinal: Negative for abdominal pain, constipation, diarrhea, nausea and vomiting.  Skin:       Positive for left foot wound/abscess.     Past Medical History:  Diagnosis Date  . Asthma   . Chronic bronchitis ([place])   . Clostridium difficile colitis 09/09/2014  . COPD (chronic obstructive pulmonary disease) ([place])   . Eczema   . Essential hypertension   . 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 Hyperlipidemia   . Neuromuscular disorder ([facility])    neuropathy in feet  . 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 Substance abuse ([place])    crack - recovered x 30 yrs  . Type II diabetes mellitus ([place])    diagnosed in 2013    Social History   Tobacco Use  . Smoking status: Smoker, Current Status Unknown    Packs/day: 0.50    Years: 45.00    Pack years: 22.50    Types: Cigarettes    Last attempt to quit: 01/12/2018    Years since quitting: 2.5  . Smokeless tobacco: Never Used  Vaping Use  . Vaping Use: Never used  Substance Use Topics  . Alcohol use: Not Currently    Alcohol/week: 0.0 standard drinks    Comment: 08/24/2019 "nothing since <2010"  . Drug use: Not Currently    Types: Cocaine    Comment: 10/23/2018 "nothing since <1990"    Family History  Problem Relation Age of Onset  . Hypertension Mother   . Hypertension Sister   . Glaucoma Sister   . Colon cancer Neg Hx   . Colon polyps Neg Hx   . Esophageal cancer Neg Hx   . Rectal cancer Neg Hx   . Stomach cancer Neg Hx     No Known Allergies  OBJECTIVE: Blood pressure 134/62, pulse 87, temperature 98.6 F (37 C), temperature source Oral, resp. rate 15, height 6\' 2"  (1.88 m), weight 106.6 kg, SpO2 100 %.  Physical Exam Constitutional:      General: He is not in acute distress.    Appearance: He is well-developed.     Comments: Lying in bed with head of bed elevated; pleasant  Cardiovascular:     Rate and Rhythm: Normal rate and regular rhythm.     Heart  sounds: Normal heart sounds.  Pulmonary:     Effort: Pulmonary effort is normal.     Breath sounds: Normal breath sounds.  Musculoskeletal:     Comments: Left foot with generalized erythema and edema.  There is generalized tenderness from mid calf distally.  Palpable dorsalis pedis pulse.  Apparent foot ulcer/soft tissue wound under the head of the fourth metatarsal extending medially and laterally.  Sensation decreased on the plantar aspect of the foot.  Hypersensitivity through remaining dorsal, medial, and lateral aspect.  Skin:    General: Skin is warm and dry.  Neurological:     Mental Status: He is alert and oriented to person, place, and time.  Psychiatric:        Behavior: Behavior normal.        Thought Content: Thought content normal.        Judgment: Judgment  normal.     Lab Results Lab Results  Component Value Date   WBC 13.3 (H) 07/25/2020   HGB 12.4 (L) 07/25/2020   HCT 41.1 07/25/2020   MCV 84.4 07/25/2020   PLT 402 (H) 07/25/2020    Lab Results  Component Value Date   CREATININE 1.10 07/25/2020   BUN 15 07/25/2020   NA 138 07/25/2020   K 4.8 07/25/2020   CL 99 07/25/2020   CO2 25 07/25/2020    Lab Results  Component Value Date   ALT 21 07/25/2020   AST 19 07/25/2020   ALKPHOS 129 (H) 07/25/2020   BILITOT 0.6 07/25/2020     Microbiology: Recent Results (from the past 240 hour(s))  Respiratory Panel by RT PCR (Flu A&B, Covid) - Nasopharyngeal Swab     Status: None   Collection Time: 07/19/20  4:52 PM   Specimen: Nasopharyngeal Swab  Result Value Ref Range Status   SARS Coronavirus 2 by RT PCR NEGATIVE NEGATIVE Final    Comment: (NOTE) SARS-CoV-2 target nucleic acids are NOT DETECTED.  The SARS-CoV-2 RNA is generally detectable in upper respiratoy specimens during the acute phase of infection. The lowest concentration of SARS-CoV-2 viral copies this assay can detect is 131 copies/mL. A negative result does not preclude SARS-Cov-2 infection and  should not be used as the sole basis for treatment or other patient management decisions. A negative result may occur with  improper specimen collection/handling, submission of specimen other than nasopharyngeal swab, presence of viral mutation(s) within the areas targeted by this assay, and inadequate number of viral copies (<131 copies/mL). A negative result must be combined with clinical observations, patient history, and epidemiological information. The expected result is Negative.  Fact Sheet for Patients:  PinkCheek.be  Fact Sheet for Healthcare Providers:  GravelBags.it  This test is no t yet approved or cleared by the [place] FDA and  has been authorized for detection and/or diagnosis of SARS-CoV-2 by FDA under an Emergency Use Authorization (EUA). This EUA will remain  in effect (meaning this test can be used) for the duration of the COVID-19 declaration under Section 564(b)(1) of the Act, 21 U.S.C. section 360bbb-3(b)(1), unless the authorization is terminated or revoked sooner.     Influenza A by PCR NEGATIVE NEGATIVE Final   Influenza B by PCR NEGATIVE NEGATIVE Final    Comment: (NOTE) The Xpert Xpress SARS-CoV-2/FLU/RSV assay is intended as an aid in  the diagnosis of influenza from Nasopharyngeal swab specimens and  should not be used as a sole basis for treatment. Nasal washings and  aspirates are unacceptable for Xpert Xpress SARS-CoV-2/FLU/RSV  testing.  Fact Sheet for Patients: PinkCheek.be  Fact Sheet for Healthcare Providers: GravelBags.it  This test is not yet approved or cleared by the [place] FDA and  has been authorized for detection and/or diagnosis of SARS-CoV-2 by  FDA under an Emergency Use Authorization (EUA). This EUA will remain  in effect (meaning this test can be used) for the duration of the  Covid-19 declaration  under Section 564(b)(1) of the Act, 21  U.S.C. section 360bbb-3(b)(1), unless the authorization is  terminated or revoked. Performed at [facility], [place] [address], [place], [place] [address]   Blood culture (routine x 2)     Status: None   Collection Time: 07/19/20  8:15 PM   Specimen: BLOOD  Result Value Ref Range Status   Specimen Description BLOOD SITE NOT SPECIFIED  Final   Special Requests   Final  BOTTLES DRAWN AEROBIC AND ANAEROBIC Blood Culture adequate volume   Culture   Final    NO GROWTH 5 DAYS Performed at [facility], [place] [address], [place], [place] [address]    Report Status 07/24/2020 FINAL  Final  Respiratory Panel by RT PCR (Flu A&B, Covid) - Nasopharyngeal Swab     Status: None   Collection Time: 07/26/20  4:50 AM   Specimen: Nasopharyngeal Swab  Result Value Ref Range Status   SARS Coronavirus 2 by RT PCR NEGATIVE NEGATIVE Final    Comment: (NOTE) SARS-CoV-2 target nucleic acids are NOT DETECTED.  The SARS-CoV-2 RNA is generally detectable in upper respiratoy specimens during the acute phase of infection. The lowest concentration of SARS-CoV-2 viral copies this assay can detect is 131 copies/mL. A negative result does not preclude SARS-Cov-2 infection and should not be used as the sole basis for treatment or other patient management decisions. A negative result may occur with  improper specimen collection/handling, submission of specimen other than nasopharyngeal swab, presence of viral mutation(s) within the areas targeted by this assay, and inadequate number of viral copies (<131 copies/mL). A negative result must be combined with clinical observations, patient history, and epidemiological information. The expected result is Negative.  Fact Sheet for Patients:  PinkCheek.be  Fact Sheet for Healthcare Providers:  GravelBags.it  This test is no t yet approved or cleared by the  [place] FDA and  has been authorized for detection and/or diagnosis of SARS-CoV-2 by FDA under an Emergency Use Authorization (EUA). This EUA will remain  in effect (meaning this test can be used) for the duration of the COVID-19 declaration under Section 564(b)(1) of the Act, 21 U.S.C. section 360bbb-3(b)(1), unless the authorization is terminated or revoked sooner.     Influenza A by PCR NEGATIVE NEGATIVE Final   Influenza B by PCR NEGATIVE NEGATIVE Final    Comment: (NOTE) The Xpert Xpress SARS-CoV-2/FLU/RSV assay is intended as an aid in  the diagnosis of influenza from Nasopharyngeal swab specimens and  should not be used as a sole basis for treatment. Nasal washings and  aspirates are unacceptable for Xpert Xpress SARS-CoV-2/FLU/RSV  testing.  Fact Sheet for Patients: PinkCheek.be  Fact Sheet for Healthcare Providers: GravelBags.it  This test is not yet approved or cleared by the [place] FDA and  has been authorized for detection and/or diagnosis of SARS-CoV-2 by  FDA under an Emergency Use Authorization (EUA). This EUA will remain  in effect (meaning this test can be used) for the duration of the  Covid-19 declaration under Section 564(b)(1) of the Act, 21  U.S.C. section 360bbb-3(b)(1), unless the authorization is  terminated or revoked. Performed at [facility], [place] [address], [place], [place] [address]      Terri Piedra, [facility]  07/26/2020  3:35 PM

## 2020-07-26 NOTE — Progress Notes (Signed)
I have discussed case with internal med team and Dr. Meridee Score or [facility].  Will plan for surgery tomorrow to decompress abscess and possible partial foot amp.  Dr. Sharol Given will discuss further with patient tomorrow.
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
## 2020-07-26 NOTE — Progress Notes (Addendum)
 Subjective: No acute overnight events.  Patient reports that he feels kind of down that he had to come back in for his left leg infection. Reports that he was initially feeling good on day of discharge even after getting home. However, on the day of readmission, his daughter noticed that his foot was looking bad so [facility] came [facility].  He is optimistic that the infection can be treated. He is afraid of having to get his foot amputated. We discussed our current plan with him to await MRI and doppler results before deciding the next course of action, with the primary objective being whether or not he has actually developed L foot osteomyelitis from his infection which would likely require more invasive management options.  He is agreeable with the plan. All questions and concerns were addressed.   ADDENDUM:  MR L Foot: IMPRESSION: 1. Focal soft tissue ulceration at the plantar aspect of the forefoot underlying the fourth toe. Interval development of a slightly ill-defined fluid collection which extends from the ulcer base into the fourth interweb space and extends across the plantar aspect of the foot underlying the proximal aspects of the second through fifth toes. Collection measures approximately 6.7 x 1.6 x 2.0 cm and is compatible with a phlegmon/developing abscess. 2. Interval development of bone marrow edema and enhancement involving the base of the fourth toe proximal phalanx. Although the T1 marrow signal at this location appears preserved, findings are suspicious for early acute osteomyelitis. 3. More subtle areas of faint marrow edema and enhancement within the proximal phalanxes of the second, third, and fifth toes. Differential includes reactive osteitis versus early acute Osteomyelitis.  -Will consult orthopedics for surgical evaluation   Objective:  Vital signs in last 24 hours: Vitals:   07/25/20 1542 07/25/20 1802 07/25/20 2203 07/26/20  0326  BP: 128/67 (!) 121/59 120/67 130/68  Pulse: (!) 104 67 93 89  Resp: 19 16 18 18  Temp: 98.6 F (37 C)  99 F (37.2 C) 98.6 F (37 C)  TempSrc: Oral  Oral Oral  SpO2: 95% 100% 97% 98%  Weight:      Height:       Physical Exam: General: well-nourished male, sitting up in bed, NAD. HE: NCAT, EOMI CV: normal rate and regular rhythm Pulm: CTABL, no adventitious sounds noted. Abdomen: soft, nondistended, nontender, +BS MSK: 1+ edema of LLE with TTP Skin: erythema of LLE up to mid-shin. Ulceration at sole of L foot underneath the third through fifth phalanges along with bloody drainage (see pictures below). Significant TTP of his L foot. Foot is warm and appears well perfused. Neuro: AAOx4, no focal deficits noted.   CBC Latest Ref Rng & Units 07/25/2020 07/23/2020 07/22/2020  WBC 4.0 - 10.5 K/uL 13.3(H) 8.4 7.2  Hemoglobin 13.0 - 17.0 g/dL 12.4(L) 11.5(L) 11.1(L)  Hematocrit 39 - 52 % 41.1 37.1(L) 35.9(L)  Platelets 150 - 400 K/uL 402(H) 310 277    BMP Latest Ref Rng & Units 07/25/2020 07/23/2020 07/22/2020  Glucose 70 - 99 mg/dL 126(H) 179(H) 189(H)  BUN 8 - 23 mg/dL 15 12 15  Creatinine 0.61 - 1.24 mg/dL 1.10 0.81 0.86  BUN/Creat Ratio 10 - 24 - - -  Sodium 135 - 145 mmol/L 138 135 136  Potassium 3.5 - 5.1 mmol/L 4.8 4.5 4.1  Chloride 98 - 111 mmol/L 99 102 103  CO2 22 - 32 mmol/L 25 22 23  Calcium 8.9 - 10.3 mg/dL 9.4 8.7(L)   8.5(L)          Assessment/Plan:  Principal Problem:   Osteomyelitis of foot ([place]) Active Problems:   Essential hypertension   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 Zeiders is a 59 year old male with PMH of T2DM with neuropathy, HTN, HLD, Asthma/COPD who presented to [facility] for readmission for his left leg nonpurulent cellulitis and possible new L foot osteomyelitis d/t likely  outpatient antibiotic failure.  Non-purulent cellulitis of LLE Possible L foot osteomyelitis His left leg appears considerably worse from time of discharge so he likely failed outpatient abx therapy. 1+ edema and erythema noted up to mid-shin of left leg with moderate TTP. Ulceration at sole of left foot (see images above). WBC 13.3, lactate 1. CRP 18.5, ESR 95. Started on vancomycin and cefepime for broad spectrum coverage. Patient does have hx of DM complicated by neuropathy. DG L foot showing possible erosion along medial aspect of 4th toe proximal phalanx adjacent to PIP joint, concerning for osteo. Prior MRI foot on 10/13 was not concerning for underlying osteo, but we will repeat MRI foot and obtain dopplers today. - on vanc and cefepime - negative blood cultures during prior admission - f/u repeat MRI L foot - f/u dopplers - if imaging concerning for osteomyelitis, will need to consult surgery  T2DM, chronic - on lantus 10 units daily - on SSI - restart home medications at discharge (Empagoflozin-Metformin 5-1014m BID, Pioglitazone 314mdaily, dulaglutide injection weekly)  HLD, chronic - continue home lipitor 4027maily and ASA 64m23mily  Asthma/COPD, chronic - continue albuterol nebs, dulera nebs - restart home medications at discharge (montelukast 10mg38mvocetirizine 5mg, 20muterol nebs)   Prior to Admission Living Arrangement: Home Anticipated Discharge Location: Home Barriers to Discharge: none Dispo: TBD  [name]0/19/2021, 10:08 AM Pager: [phone] 5pm on weekdays and 1pm on weekends: On Call pager [phone]

## 2020-07-27 ENCOUNTER — Inpatient Hospital Stay (HOSPITAL_COMMUNITY): Payer: Medicare Other | Admitting: Certified Registered Nurse Anesthetist

## 2020-07-27 ENCOUNTER — Encounter (HOSPITAL_COMMUNITY): Payer: Self-pay | Admitting: Internal Medicine

## 2020-07-27 ENCOUNTER — Other Ambulatory Visit: Payer: Self-pay

## 2020-07-27 ENCOUNTER — Other Ambulatory Visit: Payer: Self-pay | Admitting: Physician Assistant

## 2020-07-27 ENCOUNTER — Encounter (HOSPITAL_COMMUNITY)
Admission: EM | Disposition: A | Discharge status: Home Health | Payer: Self-pay | Source: Home / Self Care | Attending: Internal Medicine

## 2020-07-27 DIAGNOSIS — E1140 Type 2 diabetes mellitus with diabetic neuropathy, unspecified: Secondary | ICD-10-CM | POA: Diagnosis not present

## 2020-07-27 DIAGNOSIS — M86272 Subacute osteomyelitis, left ankle and foot: Secondary | ICD-10-CM | POA: Diagnosis not present

## 2020-07-27 DIAGNOSIS — L02612 Cutaneous abscess of left foot: Secondary | ICD-10-CM | POA: Diagnosis not present

## 2020-07-27 DIAGNOSIS — I739 Peripheral vascular disease, unspecified: Secondary | ICD-10-CM | POA: Diagnosis not present

## 2020-07-27 DIAGNOSIS — E1139 Type 2 diabetes mellitus with other diabetic ophthalmic complication: Secondary | ICD-10-CM | POA: Diagnosis not present

## 2020-07-27 DIAGNOSIS — M868X7 Other osteomyelitis, ankle and foot: Secondary | ICD-10-CM | POA: Diagnosis not present

## 2020-07-27 HISTORY — PX: AMPUTATION: SHX166

## 2020-07-27 LAB — BASIC METABOLIC PANEL
Anion gap: 8 (ref 5–15)
BUN: 16 mg/dL (ref 8–23)
CO2: 25 mmol/L (ref 22–32)
Calcium: 8.4 mg/dL — ABNORMAL LOW (ref 8.9–10.3)
Chloride: 101 mmol/L (ref 98–111)
Creatinine, Ser: 0.89 mg/dL (ref 0.61–1.24)
GFR, Estimated: >60 mL/min (ref >60)
Glucose, Bld: 141 mg/dL — ABNORMAL HIGH (ref 70–99)
Potassium: 4.1 mmol/L (ref 3.5–5.1)
Sodium: 134 mmol/L — ABNORMAL LOW (ref 135–145)

## 2020-07-27 LAB — CBC
HCT: 35.2 % — ABNORMAL LOW (ref 39.0–52.0)
Hemoglobin: 10.9 g/dL — ABNORMAL LOW (ref 13.0–17.0)
MCH: 25.6 pg — ABNORMAL LOW (ref 26.0–34.0)
MCHC: 31 g/dL (ref 30.0–36.0)
MCV: 82.6 fL (ref 80.0–100.0)
Platelets: 365 10*3/uL (ref 150–400)
RBC: 4.26 MIL/uL (ref 4.22–5.81)
RDW: 13.6 % (ref 11.5–15.5)
WBC: 6.9 10*3/uL (ref 4.0–10.5)
nRBC: 0 % (ref 0.0–0.2)

## 2020-07-27 LAB — GLUCOSE, CAPILLARY
Glucose-Capillary: 127 mg/dL — ABNORMAL HIGH (ref 70–99)
Glucose-Capillary: 166 mg/dL — ABNORMAL HIGH (ref 70–99)
Glucose-Capillary: 97 mg/dL (ref 70–99)
Glucose-Capillary: 121 mg/dL — ABNORMAL HIGH (ref 70–99)
Glucose-Capillary: 216 mg/dL — ABNORMAL HIGH (ref 70–99)

## 2020-07-27 LAB — MRSA PCR SCREENING: MRSA by PCR: NEGATIVE

## 2020-07-27 SURGERY — AMPUTATION, FOOT, PARTIAL
Anesthesia: Monitor Anesthesia Care | Site: Foot | Laterality: Left

## 2020-07-27 MED ORDER — 0.9 % SODIUM CHLORIDE (POUR BTL) OPTIME
TOPICAL | Status: DC | PRN
  Administered 2020-07-27: 1000 mL

## 2020-07-27 MED ORDER — CHLORHEXIDINE GLUCONATE 0.12 % MT SOLN
OROMUCOSAL | Status: AC
  Administered 2020-07-27: 15 mL via OROMUCOSAL
  Filled 2020-07-27: qty 15

## 2020-07-27 MED ORDER — MIDAZOLAM HCL 2 MG/2ML IJ SOLN: 1.0000 mg | Freq: Once | INTRAMUSCULAR | Status: AC

## 2020-07-27 MED ORDER — PROPOFOL 500 MG/50ML IV EMUL
INTRAVENOUS | Status: DC | PRN
  Administered 2020-07-27: 50 ug/kg/min via INTRAVENOUS

## 2020-07-27 MED ORDER — MIDAZOLAM HCL 2 MG/2ML IJ SOLN
INTRAMUSCULAR | Status: AC
  Administered 2020-07-27: 1 mg via INTRAVENOUS
  Filled 2020-07-27: qty 2

## 2020-07-27 MED ORDER — LACTATED RINGERS IV SOLN: INTRAVENOUS | Status: DC

## 2020-07-27 MED ORDER — LIDOCAINE-EPINEPHRINE (PF) 1.5 %-1:200000 IJ SOLN
INTRAMUSCULAR | Status: DC | PRN
  Administered 2020-07-27: 15 mL via PERINEURAL

## 2020-07-27 MED ORDER — BUPIVACAINE-EPINEPHRINE (PF) 0.5% -1:200000 IJ SOLN
INTRAMUSCULAR | Status: DC | PRN
  Administered 2020-07-27: 10 mL via PERINEURAL

## 2020-07-27 MED ORDER — FENTANYL CITRATE (PF) 100 MCG/2ML IJ SOLN: 50.0000 ug | Freq: Once | INTRAMUSCULAR | Status: AC

## 2020-07-27 MED ORDER — CHLORHEXIDINE GLUCONATE 0.12 % MT SOLN
15.0000 mL | OROMUCOSAL | Status: AC
  Filled 2020-07-27: qty 15

## 2020-07-27 MED ORDER — CEFAZOLIN SODIUM-DEXTROSE 2-4 GM/100ML-% IV SOLN
2.0000 g | INTRAVENOUS | Status: AC
  Administered 2020-07-27: 2 g via INTRAVENOUS
  Filled 2020-07-27: qty 100

## 2020-07-27 MED ORDER — FENTANYL CITRATE (PF) 100 MCG/2ML IJ SOLN
INTRAMUSCULAR | Status: AC
  Administered 2020-07-27: 50 ug via INTRAVENOUS
  Filled 2020-07-27: qty 2

## 2020-07-27 SURGICAL SUPPLY — 28 items
BNDG COHESIVE 4X5 TAN STRL (GAUZE/BANDAGES/DRESSINGS) ×2 IMPLANT
BNDG GAUZE ELAST 4 BULKY (GAUZE/BANDAGES/DRESSINGS) ×2 IMPLANT
COVER SURGICAL LIGHT HANDLE (MISCELLANEOUS) ×2 IMPLANT
DRAPE U-SHAPE 47X51 STRL (DRAPES) ×2 IMPLANT
DRESSING PEEL AND PLC PRVNA 13 (GAUZE/BANDAGES/DRESSINGS) ×1 IMPLANT
DRSG ADAPTIC 3X8 NADH LF (GAUZE/BANDAGES/DRESSINGS) IMPLANT
DRSG EMULSION OIL 3X3 NADH (GAUZE/BANDAGES/DRESSINGS) ×2 IMPLANT
DRSG PEEL AND PLACE PREVENA 13 (GAUZE/BANDAGES/DRESSINGS) ×2
DURAPREP 26ML APPLICATOR (WOUND CARE) ×2 IMPLANT
ELECT REM PT RETURN 9FT ADLT (ELECTROSURGICAL) ×2
ELECTRODE REM PT RTRN 9FT ADLT (ELECTROSURGICAL) ×1 IMPLANT
GAUZE SPONGE 4X4 12PLY STRL (GAUZE/BANDAGES/DRESSINGS) ×2 IMPLANT
GLOVE BIOGEL PI IND STRL 9 (GLOVE) ×1 IMPLANT
GLOVE BIOGEL PI INDICATOR 9 (GLOVE) ×1
GLOVE SURG ORTHO 9.0 STRL STRW (GLOVE) ×2 IMPLANT
GOWN STRL REUS W/ TWL XL LVL3 (GOWN DISPOSABLE) ×3 IMPLANT
GOWN STRL REUS W/TWL XL LVL3 (GOWN DISPOSABLE) ×3
KIT BASIN OR (CUSTOM PROCEDURE TRAY) ×2 IMPLANT
KIT DRSG PREVENA PLUS 7DAY 125 (MISCELLANEOUS) ×2 IMPLANT
KIT TURNOVER KIT B (KITS) ×2 IMPLANT
NS IRRIG 1000ML POUR BTL (IV SOLUTION) ×2 IMPLANT
PACK ORTHO EXTREMITY (CUSTOM PROCEDURE TRAY) ×2 IMPLANT
PAD ABD 7.5X8 STRL (GAUZE/BANDAGES/DRESSINGS) ×2 IMPLANT
SUT ETHILON 2 0 PSLX (SUTURE) ×4 IMPLANT
TOWEL GREEN STERILE (TOWEL DISPOSABLE) ×2 IMPLANT
TOWEL GREEN STERILE FF (TOWEL DISPOSABLE) ×2 IMPLANT
TUBE CONNECTING 12X1/4 (SUCTIONS) ×2 IMPLANT
YANKAUER SUCT BULB TIP NO VENT (SUCTIONS) ×2 IMPLANT

## 2020-07-27 NOTE — Telephone Encounter (Signed)
Lewis, Leah  Kashus Karlen L, RN; Lewis, Leah; Stenson, Melissa; Gilliam, Pamela; New, Bradley; 1 other ° ° °received, thanks  ° °
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
## 2020-07-27 NOTE — Anesthesia Procedure Notes (Signed)
Anesthesia Regional Block: Popliteal block   Pre-Anesthetic Checklist: ,, timeout performed, Correct Patient, Correct Site, Correct Laterality, Correct Procedure, Correct Position, site marked, Risks and benefits discussed,  Surgical consent,  Pre-op evaluation,  At surgeon's request and post-op pain management  Laterality: Left and Lower  Prep: chloraprep       Needles:  Injection technique: Single-shot     Needle Length: 9cm  Needle Gauge: 22     Additional Needles: Arrow StimuQuik ECHO Echogenic Stimulating PNB Needle  Procedures:,,,, ultrasound used (permanent image in chart),,,,  Narrative:  Start time: 07/27/2020 5:11 PM End time: 07/27/2020 5:15 PM Injection made incrementally with aspirations every 5 mL.  Performed by: Personally  Anesthesiologist: Oleta Mouse, MD

## 2020-07-27 NOTE — Progress Notes (Signed)
[facility]  Date of Admission:  07/25/2020     Total days of antibiotics 6         ASSESSMENT:  Richard [name] is scheduled for fourth/fifth toe amputation today for abscess and subacute osteomyelitis of the left foot.  He is not bacteremic which is encouraging.  Will likely need short course of antibiotics following amputation.  Continue to hold antibiotics pending surgical cultures.  Recommend initiation of broad-spectrum antibiotics upon completion of surgery with vancomycin and ceftriaxone.  Discussed importance of continued foot care and blood sugar management.  PLAN:  1. Continue to hold antibiotics till after surgery. 2. After surgery consider broad-spectrum vancomycin and ceftriaxone. 3. Surgery today. 4. Await culture results and narrow antibiotics as able. 5. Wound care per orthopedics.  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 aspirin  81 mg Oral Daily  . atorvastatin  40 mg Oral Daily  . enoxaparin (LOVENOX) injection  40 mg Subcutaneous Q24H  . insulin aspart  0-15 Units Subcutaneous TID WC  . insulin glargine  10 Units Subcutaneous QHS  . latanoprost  1 drop Both Eyes QHS  . mometasone-formoterol  2 puff Inhalation BID  . pregabalin  100 mg Oral TID    SUBJECTIVE:  Afebrile overnight with no acute events.  Seen by Dr. Sharol Given with plans for fourth/fifth toe amputation this afternoon.  Has several questions regarding how this happened.  No Known Allergies   Review of Systems: Review of Systems  Constitutional: Negative for chills, fever and weight loss.  Respiratory: Negative for cough, shortness of breath and wheezing.   Cardiovascular: Negative for chest pain and leg swelling.   Gastrointestinal: Negative for abdominal pain, constipation, diarrhea, nausea and vomiting.  Skin: Negative for rash.      OBJECTIVE: Vitals:   07/26/20 1959 07/26/20 2019 07/27/20 0449 07/27/20 1205  BP: 119/69 111/66 122/67 134/75  Pulse: 80 78 79 80  Resp: 18 18 18 16   Temp: 98.7 F (37.1 C) 98.6 F (37 C) 98.6 F (37 C) 98.6 F (37 C)  TempSrc: Oral Oral Oral Oral  SpO2: 98% 96% 97% 98%  Weight:  102.7 kg    Height:       Body mass index is 29.07 kg/m.  Physical Exam Constitutional:      General: He is not in acute distress.    Appearance: He is well-developed.  Cardiovascular:     Rate and Rhythm: Normal rate and regular rhythm.     Heart sounds: Normal heart sounds.  Pulmonary:     Effort: Pulmonary effort is normal.     Breath sounds: Normal breath sounds.  Musculoskeletal:     Comments: Redness and edema of left lower extremity.  No significant drainage noted.  Generalized tenderness.  Skin:    General: Skin is warm and dry.  Neurological:     Mental Status: He is alert and oriented to person, place, and time.  Psychiatric:        Behavior: Behavior normal.        Thought Content: Thought content normal.        Judgment: Judgment normal.     Lab Results Lab Results  Component Value Date   WBC 6.9 07/27/2020   HGB 10.9 (  L) 07/27/2020   HCT 35.2 (L) 07/27/2020   MCV 82.6 07/27/2020   PLT 365 07/27/2020    Lab Results  Component Value Date   CREATININE 0.89 07/27/2020   BUN 16 07/27/2020   NA 134 (L) 07/27/2020   K 4.1 07/27/2020   CL 101 07/27/2020   CO2 25 07/27/2020    Lab Results  Component Value Date   ALT 21 07/25/2020   AST 19 07/25/2020   ALKPHOS 129 (H) 07/25/2020   BILITOT 0.6 07/25/2020     Microbiology: Recent Results (from the past 240 hour(s))  Respiratory Panel by RT PCR (Flu A&B, Covid) - Nasopharyngeal Swab     Status: None   Collection Time: 07/19/20  4:52 PM   Specimen: Nasopharyngeal Swab  Result Value Ref Range  Status   SARS Coronavirus 2 by RT PCR NEGATIVE NEGATIVE Final    Comment: (NOTE) SARS-CoV-2 target nucleic acids are NOT DETECTED.  The SARS-CoV-2 RNA is generally detectable in upper respiratoy specimens during the acute phase of infection. The lowest concentration of SARS-CoV-2 viral copies this assay can detect is 131 copies/mL. A negative result does not preclude SARS-Cov-2 infection and should not be used as the sole basis for treatment or other patient management decisions. A negative result may occur with  improper specimen collection/handling, submission of specimen other than nasopharyngeal swab, presence of viral mutation(s) within the areas targeted by this assay, and inadequate number of viral copies (<131 copies/mL). A negative result must be combined with clinical observations, patient history, and epidemiological information. The expected result is Negative.  Fact Sheet for Patients:  PinkCheek.be  Fact Sheet for Healthcare Providers:  GravelBags.it  This test is no t yet approved or cleared by the [place] FDA and  has been authorized for detection and/or diagnosis of SARS-CoV-2 by FDA under an Emergency Use Authorization (EUA). This EUA will remain  in effect (meaning this test can be used) for the duration of the COVID-19 declaration under Section 564(b)(1) of the Act, 21 U.S.C. section 360bbb-3(b)(1), unless the authorization is terminated or revoked sooner.     Influenza A by PCR NEGATIVE NEGATIVE Final   Influenza B by PCR NEGATIVE NEGATIVE Final    Comment: (NOTE) The Xpert Xpress SARS-CoV-2/FLU/RSV assay is intended as an aid in  the diagnosis of influenza from Nasopharyngeal swab specimens and  should not be used as a sole basis for treatment. Nasal washings and  aspirates are unacceptable for Xpert Xpress SARS-CoV-2/FLU/RSV  testing.  Fact Sheet for  Patients: PinkCheek.be  Fact Sheet for Healthcare Providers: GravelBags.it  This test is not yet approved or cleared by the [place] FDA and  has been authorized for detection and/or diagnosis of SARS-CoV-2 by  FDA under an Emergency Use Authorization (EUA). This EUA will remain  in effect (meaning this test can be used) for the duration of the  Covid-19 declaration under Section 564(b)(1) of the Act, 21  U.S.C. section 360bbb-3(b)(1), unless the authorization is  terminated or revoked. Performed at [facility], [place] [address], [place], [place] [address]   Blood culture (routine x 2)     Status: None   Collection Time: 07/19/20  8:15 PM   Specimen: BLOOD  Result Value Ref Range Status   Specimen Description BLOOD SITE NOT SPECIFIED  Final   Special Requests   Final    BOTTLES DRAWN AEROBIC AND ANAEROBIC Blood Culture adequate volume   Culture   Final    NO GROWTH  5 DAYS Performed at [facility], [place] [address], [place], [place] [address]    Report Status 07/24/2020 FINAL  Final  Respiratory Panel by RT PCR (Flu A&B, Covid) - Nasopharyngeal Swab     Status: None   Collection Time: 07/26/20  4:50 AM   Specimen: Nasopharyngeal Swab  Result Value Ref Range Status   SARS Coronavirus 2 by RT PCR NEGATIVE NEGATIVE Final    Comment: (NOTE) SARS-CoV-2 target nucleic acids are NOT DETECTED.  The SARS-CoV-2 RNA is generally detectable in upper respiratoy specimens during the acute phase of infection. The lowest concentration of SARS-CoV-2 viral copies this assay can detect is 131 copies/mL. A negative result does not preclude SARS-Cov-2 infection and should not be used as the sole basis for treatment or other patient management decisions. A negative result may occur with  improper specimen collection/handling, submission of specimen other than nasopharyngeal swab, presence of viral mutation(s) within  the areas targeted by this assay, and inadequate number of viral copies (<131 copies/mL). A negative result must be combined with clinical observations, patient history, and epidemiological information. The expected result is Negative.  Fact Sheet for Patients:  PinkCheek.be  Fact Sheet for Healthcare Providers:  GravelBags.it  This test is no t yet approved or cleared by the [place] FDA and  has been authorized for detection and/or diagnosis of SARS-CoV-2 by FDA under an Emergency Use Authorization (EUA). This EUA will remain  in effect (meaning this test can be used) for the duration of the COVID-19 declaration under Section 564(b)(1) of the Act, 21 U.S.C. section 360bbb-3(b)(1), unless the authorization is terminated or revoked sooner.     Influenza A by PCR NEGATIVE NEGATIVE Final   Influenza B by PCR NEGATIVE NEGATIVE Final    Comment: (NOTE) The Xpert Xpress SARS-CoV-2/FLU/RSV assay is intended as an aid in  the diagnosis of influenza from Nasopharyngeal swab specimens and  should not be used as a sole basis for treatment. Nasal washings and  aspirates are unacceptable for Xpert Xpress SARS-CoV-2/FLU/RSV  testing.  Fact Sheet for Patients: PinkCheek.be  Fact Sheet for Healthcare Providers: GravelBags.it  This test is not yet approved or cleared by the [place] FDA and  has been authorized for detection and/or diagnosis of SARS-CoV-2 by  FDA under an Emergency Use Authorization (EUA). This EUA will remain  in effect (meaning this test can be used) for the duration of the  Covid-19 declaration under Section 564(b)(1) of the Act, 21  U.S.C. section 360bbb-3(b)(1), unless the authorization is  terminated or revoked. Performed at [facility], [place] [address] [place], [place] [address]      Terri Piedra, [facility]  [facility]  07/27/2020  12:16 PM

## 2020-07-27 NOTE — Transfer of Care (Signed)
Immediate Anesthesia Transfer of Care Note  Patient: Richard [name]  Procedure(s) Performed: AMPUTATION 4TH AND 5TH TOE LEFT (Left Foot)  Patient Location: PACU  Anesthesia Type:General  Level of Consciousness: awake  Airway & Oxygen Therapy: Patient Spontanous Breathing and Patient connected to nasal cannula oxygen  Post-op Assessment: Report given to RN and Post -op Vital signs reviewed and stable  Post vital signs: Reviewed and stable  Last Vitals:  Vitals Value Taken Time  BP    Temp    Pulse 92 07/27/20 1815  Resp 17 07/27/20 1815  SpO2 97 % 07/27/20 1815  Vitals shown include unvalidated device data.  Last Pain:  Vitals:   07/27/20 1400  TempSrc:   PainSc: Asleep         Complications: No complications documented.

## 2020-07-27 NOTE — Op Note (Signed)
07/27/2020  6:30 PM  PATIENT:  Richard [name]    PRE-OPERATIVE DIAGNOSIS:  LEFT FOOT ABCESS  POST-OPERATIVE DIAGNOSIS:  Same  PROCEDURE:  AMPUTATION 4TH AND 5TH ray LEFT foot Local tissue rearrangement for wound closure 7 x 3 cm. Application of Prevena wound VAC 13 cm  SURGEON:  [name], MD  PHYSICIAN ASSISTANT:None ANESTHESIA:   General  PREOPERATIVE INDICATIONS:  Richard [name] is a  59 y.o. male with a diagnosis of LEFT FOOT ABCESS who failed conservative measures and elected for surgical management.    The risks benefits and alternatives were discussed with the patient preoperatively including but not limited to the risks of infection, bleeding, nerve injury, cardiopulmonary complications, the need for revision surgery, among others, and the patient was willing to proceed.  OPERATIVE IMPLANTS: Praveena wound VAC 13 cm  @ENCIMAGES @  OPERATIVE FINDINGS: The abscess extended dorsally and plantarly around the third metatarsal head.  The soft tissue was debrided and the abscess tissue was sent for cultures.  OPERATIVE PROCEDURE: Patient brought the operating room underwent a general anesthetic.  After adequate levels anesthesia were obtained patient's left lower extremity was prepped using DuraPrep draped into a sterile field a timeout was called.  A racquet incision was made around the ulcerative tissue and the fourth and fifth rays.  This left a wound that was 7 x 3 cm.  The fourth and fifth rays were resected through the base the necrotic tissue and bone was resected in 1 block of tissue.  Examination the abscess extended medially underneath the third metatarsal head.  Rondure was used to further debride the soft tissue and the necrotic abscess tissue was sent for cultures.  The wound was irrigated with normal saline electrocautery was used for hemostasis.  Local tissue rearrangement was used to close the wound 7 x 3 cm.  A Prevena wound VAC was applied this was covered with  Covan this had a good suction fit patient was taken the PACU in stable condition.   DISCHARGE PLANNING:  Antibiotic duration: Continue antibiotics anticipate patient will need 8month antibiotics  Weightbearing: Nonweightbearing on the left  Pain medication: Opioid pathway  Dressing care/ Wound VAC: Continue wound VAC for 1 week  Ambulatory devices: Walker  Discharge to: Anticipate discharge to home.  Follow-up: In the office 1 week post operative.

## 2020-07-27 NOTE — Progress Notes (Signed)
Subjective:   Was feeling stabbing pains in L foot earlier this AM. Feeling better now.  States that he was initially very hesitant when told that he will need two of his toes amputated. However, reports that he prayed and is now feeling much better about the entire thing, stating that it could be much worse. Optimistic about the recovery process.  Surgery to take place around 4pm today.   Objective:  Vital signs in last 24 hours: Vitals:   07/26/20 1525 07/26/20 1959 07/26/20 2019 07/27/20 0449  BP: 134/62 119/69 111/66 122/67  Pulse: 87 80 78 79  Resp: _0 Temp:  98.7 F (37.1 C) 98.6 F (37 C) 98.6 F (37 C)  TempSrc:  Oral Oral Oral  SpO2: 100% 98% 96% 97%  Weight:   102.7 kg   Height:       Physical Exam: General: well-nourished male, sitting up in bed, NAD HE: NCAT, EOMI CV: normal rate and regular rhythm Pulm: CTABL, no adventitious sounds noted MSK: 1+ edema of LLE with TTP Skin: Ulceration at sole of L foot underneath 3rd-5th phalanges along with bloody drainage. L foot is warm and appears well perfused. Neuro: AAOx4, no focal deficits noted   CBC Latest Ref Rng & Units 07/27/2020 07/25/2020 07/23/2020  WBC 4.0 - 10.5 K/uL 6.9 13.3(H) 8.4  Hemoglobin 13.0 - 17.0 g/dL 10.9(L) 12.4(L) 11.5(L)  Hematocrit 39 - 52 % 35.2(L) 41.1 37.1(L)  Platelets 150 - 400 K/uL 365 402(H) 310    BMP Latest Ref Rng & Units 07/27/2020 07/25/2020 07/23/2020  Glucose 70 - 99 mg/dL 141(H) 126(H) 179(H)  BUN 8 - 23 mg/dL _1 Creatinine 0.61 - 1.24 mg/dL 0.89 1.10 0.81  BUN/Creat Ratio 10 - 24 - - -  Sodium 135 - 145 mmol/L 134(L) 138 135  Potassium 3.5 - 5.1 mmol/L 4.1 4.8 4.5  Chloride 98 - 111 mmol/L 101 99 102  CO2 22 - 32 mmol/L _2 Calcium 8.9 - 10.3 mg/dL 8.4(L) 9.4 8.7(L)          Assessment/Plan:  Principal Problem:   Osteomyelitis of foot ([facility]) Active Problems:   Essential hypertension   Uncontrolled type 2 diabetes mellitus with  ophthalmic complication, without long-term current use of insulin ([facility])   Diabetic neuropathy, painful ([place])   Peripheral arterial disease [facility])  Mr. Canterbury is a 59 year old male with PMH of T2DM with neuropathy, HTN, HLD, Asthma/COPD who presented to [facility] on 10/18 for readmission for his left leg nonpurulent cellulitis and possible new L foot osteomyelitis d/t likely outpatient antibiotic failure.  Non-purulent cellulitis of LLE Possible L foot osteomyelitis His left leg appears considerably worse from time of discharge so he likely failed outpatient abx therapy. 1+ edema and erythema noted up to mid-shin of left leg with moderate TTP. Ulceration at sole of left foot (see images above). WBC 13.3, lactate 1. CRP 18.5, ESR 95. Patient does have hx of DM complicated by neuropathy. DG L foot showing possible erosion along medial aspect of 4th toe proximal phalanx adjacent to PIP joint, concerning for osteo. MRI showing large abscess with possible osteomyelitis. Orthopedics to perform I&D and partial foot amputation this afternoon. - holding abx at this time in anticipation of surgical cultures. - will restart broad spectrum coverage with vanc and cefepime after surgery until cultures come back - negative dopplers - WBC 13.3 > 6.9  T2DM, chronic - on lantus 10 units daily - on SSI -  restart home medications at discharge (Empagoflozin-Metformin 5-1063m BID, Pioglitazone 367mdaily, dulaglutide injection weekly)  HLD, chronic - continue home lipitor 4085maily and ASA 66m64mily  Asthma/COPD, chronic - continue albuterol nebs, dulera nebs - restart home medications at discharge (montelukast 10mg71mvocetirizine 5mg, 16muterol nebs)   Prior to Admission Living Arrangement: Home Anticipated Discharge Location: Home Barriers to Discharge: none Dispo: TBD  [name]0/20/2021, 7:06 AM Pager: [phone] 5pm on weekdays and 1pm on weekends: On Call pager [phone]

## 2020-07-27 NOTE — Progress Notes (Signed)
Report called to OR staff. Pt transported to OR by transporter, pt GCS 15, left arm nsl intact, LLE warm to the touch  1+LDP

## 2020-07-27 NOTE — Anesthesia Preprocedure Evaluation (Signed)
Anesthesia Evaluation  Patient identified by MRN, date of birth, ID band Patient awake    Reviewed: Allergy & Precautions, NPO status , Patient's Chart, lab work & pertinent test results  History of Anesthesia Complications Negative for: history of anesthetic complications  Airway Mallampati: II  TM Distance: >3 FB Neck ROM: Full    Dental  (+) Dental Advisory Given   Pulmonary neg shortness of breath, asthma , neg sleep apnea, COPD, neg recent URI,  Covid-19 Nucleic Acid Test Results Lab Results      Component                Value               Date                      SARSCOV2NAA              NEGATIVE            07/26/2020                [place]              NEGATIVE            07/19/2020              breath sounds clear to auscultation       Cardiovascular hypertension, Pt. on medications + CAD and + Peripheral Vascular Disease   Rhythm:Regular  Left ventricle: The cavity size was normal. Wall thickness was  normal. Systolic function was normal. The estimated ejection  fraction was in the range of 50% to 55%. Wall motion was normal;  there were no regional wall motion abnormalities.  - Aortic valve: Trileaflet; moderately thickened, moderately  calcified leaflets. Valve mobility was restricted.  - Mitral valve: Calcified annulus. Moderately thickened, mildly  calcified leaflets . There was mild regurgitation.  - Left atrium: The atrium was mildly dilated.    Mild nonobstructive CAD.  LVEDP 17 mm Hg.   Continue aggressive preventive therapy.  He needs to stop smoking.      Neuro/Psych neg Seizures  Neuromuscular disease negative psych ROS   GI/Hepatic negative GI ROS, Neg liver ROS,   Endo/Other  diabetes  Renal/GU Lab Results      Component                Value               Date                      CREATININE               0.89                07/27/2020           Lab Results      Component                 Value               Date                      K                        4.1                 07/27/2020  Musculoskeletal  (+) Arthritis ,   Abdominal   Peds  Hematology  (+) Blood dyscrasia, anemia , Lab Results      Component                Value               Date                      WBC                      6.9                 07/27/2020                HGB                      10.9 (L)            07/27/2020                HCT                      35.2 (L)            07/27/2020                MCV                      82.6                07/27/2020                PLT                      365                 07/27/2020              Anesthesia Other Findings   Reproductive/Obstetrics                             Anesthesia Physical Anesthesia Plan  ASA: III  Anesthesia Plan: MAC and Regional   Post-op Pain Management:    Induction:   PONV Risk Score and Plan: 1 and Treatment may vary due to age or medical condition and Propofol infusion  Airway Management Planned: Nasal Cannula  Additional Equipment: None  Intra-op Plan:   Post-operative Plan:   Informed Consent: I have reviewed the patients History and Physical, chart, labs and discussed the procedure including the risks, benefits and alternatives for the proposed anesthesia with the patient or authorized representative who has indicated his/her understanding and acceptance.     Dental advisory given  Plan Discussed with: CRNA and Surgeon  Anesthesia Plan Comments:         Anesthesia Quick Evaluation

## 2020-07-27 NOTE — Telephone Encounter (Signed)
CM sent to Skeet Latch at [facility] for nebulizer. F2F was 07/25/2020. Hubbard Hartshorn, BSN, RN-BC

## 2020-07-27 NOTE — Progress Notes (Signed)
Received pt from pacu, pt GCS 15, wound vac lle no drng noted. Pt with no movement or sensation starting at partial foot down to sugical incision 1st, 2nd and 3rd toe.

## 2020-07-28 ENCOUNTER — Inpatient Hospital Stay (HOSPITAL_COMMUNITY): Payer: Medicare Other

## 2020-07-28 ENCOUNTER — Encounter (HOSPITAL_COMMUNITY): Payer: Self-pay | Admitting: Orthopedic Surgery

## 2020-07-28 DIAGNOSIS — I350 Nonrheumatic aortic (valve) stenosis: Secondary | ICD-10-CM

## 2020-07-28 DIAGNOSIS — Z89422 Acquired absence of other left toe(s): Secondary | ICD-10-CM

## 2020-07-28 DIAGNOSIS — I342 Nonrheumatic mitral (valve) stenosis: Secondary | ICD-10-CM | POA: Diagnosis not present

## 2020-07-28 DIAGNOSIS — R011 Cardiac murmur, unspecified: Secondary | ICD-10-CM

## 2020-07-28 LAB — BASIC METABOLIC PANEL
Anion gap: 7 (ref 5–15)
BUN: 13 mg/dL (ref 8–23)
CO2: 25 mmol/L (ref 22–32)
Calcium: 8.5 mg/dL — ABNORMAL LOW (ref 8.9–10.3)
Chloride: 103 mmol/L (ref 98–111)
Creatinine, Ser: 0.82 mg/dL (ref 0.61–1.24)
GFR, Estimated: >60 mL/min (ref >60)
Glucose, Bld: 134 mg/dL — ABNORMAL HIGH (ref 70–99)
Potassium: 4.4 mmol/L (ref 3.5–5.1)
Sodium: 135 mmol/L (ref 135–145)

## 2020-07-28 LAB — CBC
HCT: 35.2 % — ABNORMAL LOW (ref 39.0–52.0)
Hemoglobin: 11 g/dL — ABNORMAL LOW (ref 13.0–17.0)
MCH: 25.9 pg — ABNORMAL LOW (ref 26.0–34.0)
MCHC: 31.3 g/dL (ref 30.0–36.0)
MCV: 83 fL (ref 80.0–100.0)
Platelets: 399 10*3/uL (ref 150–400)
RBC: 4.24 MIL/uL (ref 4.22–5.81)
RDW: 13.6 % (ref 11.5–15.5)
WBC: 8.7 10*3/uL (ref 4.0–10.5)
nRBC: 0 % (ref 0.0–0.2)

## 2020-07-28 LAB — GLUCOSE, CAPILLARY
Glucose-Capillary: 113 mg/dL — ABNORMAL HIGH (ref 70–99)
Glucose-Capillary: 192 mg/dL — ABNORMAL HIGH (ref 70–99)
Glucose-Capillary: 148 mg/dL — ABNORMAL HIGH (ref 70–99)
Glucose-Capillary: 188 mg/dL — ABNORMAL HIGH (ref 70–99)

## 2020-07-28 MED ORDER — VANCOMYCIN HCL 1500 MG/300ML IV SOLN
1500.0000 mg | Freq: Two times a day (BID) | INTRAVENOUS | Status: DC
  Administered 2020-07-28: 1500 mg via INTRAVENOUS
  Filled 2020-07-28 (×2): qty 300

## 2020-07-28 MED ORDER — IBUPROFEN 400 MG PO TABS
600.0000 mg | ORAL_TABLET | Freq: Four times a day (QID) | ORAL | Status: DC
  Administered 2020-07-28 – 2020-07-30 (×7): 600 mg via ORAL
  Filled 2020-07-28 (×7): qty 1

## 2020-07-28 MED ORDER — ACETAMINOPHEN 325 MG PO TABS: 650.0000 mg | ORAL_TABLET | Freq: Four times a day (QID) | ORAL | Status: DC | PRN

## 2020-07-28 MED ORDER — HYDROMORPHONE HCL 1 MG/ML IJ SOLN
0.5000 mg | Freq: Four times a day (QID) | INTRAMUSCULAR | Status: DC | PRN
  Administered 2020-07-28 (×2): 0.5 mg via INTRAVENOUS
  Filled 2020-07-28 (×2): qty 1

## 2020-07-28 MED ORDER — OXYCODONE HCL 5 MG PO TABS
5.0000 mg | ORAL_TABLET | Freq: Four times a day (QID) | ORAL | Status: DC | PRN
  Administered 2020-07-28 – 2020-08-01 (×10): 5 mg via ORAL
  Filled 2020-07-28 (×12): qty 1

## 2020-07-28 MED ORDER — SODIUM CHLORIDE 0.9 % IV SOLN
2.0000 g | INTRAVENOUS | Status: DC
  Administered 2020-07-28 – 2020-07-29 (×2): 2 g via INTRAVENOUS
  Filled 2020-07-28 (×2): qty 20

## 2020-07-28 MED ORDER — HYDROMORPHONE HCL 1 MG/ML IJ SOLN
0.5000 mg | INTRAMUSCULAR | Status: DC | PRN
Start: 2020-07-28 — End: 2020-07-28

## 2020-07-28 MED ORDER — OXYCODONE-ACETAMINOPHEN 5-325 MG PO TABS
1.0000 | ORAL_TABLET | Freq: Four times a day (QID) | ORAL | Status: DC | PRN
  Administered 2020-07-28: 2 via ORAL
  Filled 2020-07-28: qty 2

## 2020-07-28 MED ORDER — VANCOMYCIN HCL 2000 MG/400ML IV SOLN
2000.0000 mg | Freq: Once | INTRAVENOUS | Status: AC
  Administered 2020-07-28: 2000 mg via INTRAVENOUS
  Filled 2020-07-28: qty 400

## 2020-07-28 MED ORDER — HYDROMORPHONE HCL 1 MG/ML IJ SOLN
0.5000 mg | INTRAMUSCULAR | Status: DC | PRN
  Administered 2020-07-30: 0.5 mg via INTRAVENOUS
  Filled 2020-07-28: qty 1

## 2020-07-28 NOTE — Progress Notes (Addendum)
Subjective:   Patient in bed with wound vac in place. Report significant pain which becomes manageable when given pain medications. Seems to not last long. He is also concerned at becoming addicted to the opoid pain medications. 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 We discussed a multimodal approach to pain and continuing to wean his opoid pain medications over the next few days. The opoid pain medications are PRN, but we expect he will need these medications for a few days. He is discouraged because he needs a lot of assistance to do ADLs. We encourage him to take it one day at a time. All questions and concerns addressed.   Objective:  Vital signs in last 24 hours: Vitals:   07/27/20 1827 07/27/20 1856 07/27/20 2039 07/28/20 0100  BP: (!) 147/70 (!) 148/76  138/80  Pulse: 92 86  85  Resp: 15 16  18   Temp: 98.5 F (36.9 C) 98.6 F (37 C)  98.2 F (36.8 C)  TempSrc:  Oral  Oral  SpO2: 96% 96% 95% 99%  Weight:      Height:       Physical Exam: General: well-nourished male, sitting up in bed, NAD HE: NCAT, EOMI CV: normal rate and regular rhythm, + systolic murmur Pulm: CTABL, no adventitious sounds noted Skin: wound vac in place on LLE, able to wiggle toes, appears well perfused Neuro: AAOx3, no focal deficits noted   CBC Latest Ref Rng & Units 07/28/2020 07/27/2020 07/25/2020  WBC 4.0 - 10.5 K/uL 8.7 6.9 13.3(H)  Hemoglobin 13.0 - 17.0 g/dL 11.0(L) 10.9(L) 12.4(L)  Hematocrit 39 - 52 % 35.2(L) 35.2(L) 41.1  Platelets 150 - 400 K/uL 399 365 402(H)    BMP Latest Ref Rng & Units 07/28/2020 07/27/2020 07/25/2020  Glucose 70 - 99 mg/dL 134(H) 141(H) 126(H)  BUN 8 - 23 mg/dL 13 16 15   Creatinine 0.61 - 1.24 mg/dL 0.82 0.89 1.10  BUN/Creat Ratio 10 - 24 - - -  Sodium 135 - 145 mmol/L 135 134(L) 138  Potassium 3.5 - 5.1 mmol/L 4.4 4.1 4.8  Chloride 98 - 111 mmol/L 103 101 99  CO2 22 - 32 mmol/L 25 25 25   Calcium 8.9 - 10.3 mg/dL 8.5(L) 8.4(L) 9.4      Assessment/Plan:  Principal Problem:   Osteomyelitis of foot (HCC) Active Problems:   Essential hypertension   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 Kortz is a 59 year old male with PMH of T2DM with neuropathy, HTN, HLD, Asthma/COPD who presented to [facility] on 10/18 for readmission for his left leg nonpurulent cellulitis and possible new L foot osteomyelitis d/t likely outpatient antibiotic failure.  L foot osteomyelitis s/p forth and fifth ray ampuation  -margins not clear -surgical cultures pending -restarted broad spectrum coverage with vanc and ceftriaxone (will need prolonged abx course for ~1 month as per ortho) -NWB -wound vac as per ortho -pain management with tylenol and ibuprofen for mild pain, oxycodone 5mg  q6h prn for severe pain, IV dilaudid 0.5mg  q4h prn for breakthrough pain -will have PT evaluate tomorrow -consulted TOC for home health needs  T2DM, chronic - on lantus 10 units daily - on SSI - restart home medications at discharge (Empagoflozin-Metformin 5-1000mg  BID, Pioglitazone 30mg  daily, dulaglutide injection weekly)  HLD, chronic - continue home lipitor 40mg  daily and  ASA 81mg  daily  Asthma/COPD, chronic - continue albuterol nebs, dulera nebs - restart home medications at discharge (montelukast 10mg , levocetirizine 5mg , albuterol nebs)  Systolic heart murmur -will obtain ECHO tomorrow to evaluate murmur  Prior to Admission Living Arrangement: Home Anticipated Discharge Location: Home Barriers to Discharge: none Dispo: TBD  Virl Axe, MD 07/28/2020, 7:17 AM Pager: [phone] After 5pm on weekdays and 1pm on weekends: On Call pager [phone]

## 2020-07-28 NOTE — Evaluation (Signed)
Physical Therapy Evaluation Patient Details Name: Richard [name] MRN: [id number] DOB: 1961-03-23 Today's Date: 07/28/2020   History of Present Illness  59yo male with recent admit for L foot cellulitis, now returning with increased pain and edema in L foot despite being compliant with antibiotic/medication regimen. 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 PMH COPD, HTN, HLD, neuropathy, substance abuse, DM, cardiac cath  Clinical Impression   Patient received in bed, reports high pain levels but agreeable to participating with therapy with goal of trying to use bathroom. Able to stand with MinA/RW, however once up consistently lost balance backwards requiring ModA to steady and regain balance prior to gait; once balanced, able to gait train well with only min guard for safety. Gait distance/activity limited today due to pain. Very anxious and had a breakdown once seated in recliner, stating that he is tired of being told it will be OK and putting on a facade for his family in terms of his foot- discussed possibly of speaking to chaplain, which he brightened at and I relayed to nursing. Left up in recliner with all needs met, nursing staff aware of patient status. Anticipate he will improve enough to go home with HHPT, but would benefit from PCA for the short term.     Follow Up Recommendations Home health PT;Supervision for mobility/OOB;Other (comment) (would benefit from short term PCA)    Equipment Recommendations  Rolling walker with 5" wheels;3in1 (PT) (potential WC pending progress with safe sit to stand transfers)    Recommendations for Other Services       Precautions / Restrictions Precautions Precautions: Fall;Other (comment) Precaution Comments: very anxious and stressed right now, wound vac Restrictions Weight Bearing Restrictions: Yes LLE Weight Bearing: Non weight bearing      Mobility  Bed Mobility Overal bed mobility: Modified Independent Bed Mobility:  Supine to Sit     Supine to sit: Modified independent (Device/Increase time)     General bed mobility comments: no physical assist given, assisted in managing wound vac line    Transfers Overall transfer level: Needs assistance Equipment used: Rolling walker (2 wheeled) Transfers: Sit to/from Stand Sit to Stand: Mod assist;Min assist         General transfer comment: MinA to boost all the way to upright standing, however once standing he immediately has LOB backwards requiring ModA to gain balance befor moving away from bed  Ambulation/Gait Ambulation/Gait assistance: Min guard Gait Distance (Feet): 20 Feet (29f x2) Assistive device: Rolling walker (2 wheeled) Gait Pattern/deviations:  (hop to pattern) Gait velocity: decreased   General Gait Details: gait distance limited to in room secondary to high pain levels, but steady wtih RW after getting balance after initial STS transfer  Stairs            Wheelchair Mobility    Modified Rankin (Stroke Patients Only)       Balance Overall balance assessment: Needs assistance Sitting-balance support: Bilateral upper extremity supported Sitting balance-Leahy Scale: Good     Standing balance support: Bilateral upper extremity supported;During functional activity Standing balance-Leahy Scale: Fair Standing balance comment: reliant on BUE support                             Pertinent Vitals/Pain Pain Assessment: 0-10 Pain Score: 8  Pain Location: L foot Pain Descriptors / Indicators: Aching;Sore;Sharp Pain Intervention(s): Limited activity within patient's tolerance;Monitored during session;Patient requesting pain meds-RN notified;RN gave pain meds during session;Repositioned;Utilized  relaxation techniques    Home Living Family/patient expects to be discharged to:: Private residence Living Arrangements: Alone Available Help at Discharge: Other (Comment);Family (mentions family but unclear if they can  assist him) Type of Home: Other(Comment) (townhome) Home Access: Stairs to enter Entrance Stairs-Rails: Psychiatric nurse of Steps: 3-4 Home Layout: Two level;1/2 bath on main level;Bed/bath upstairs Home Equipment: Grab bars - tub/shower;Crutches      Prior Function Level of Independence: Independent         Comments: on disability, no driving; independent iADLs, active and walking regularly     Hand Dominance        Extremity/Trunk Assessment   Upper Extremity Assessment Upper Extremity Assessment: Defer to OT evaluation    Lower Extremity Assessment Lower Extremity Assessment: Overall WFL for tasks assessed    Cervical / Trunk Assessment Cervical / Trunk Assessment: Normal  Communication   Communication: No difficulties  Cognition Arousal/Alertness: Awake/alert Behavior During Therapy: Anxious Overall Cognitive Status: Within Functional Limits for tasks assessed                                 General Comments: very anxious and stressed today- at EOS had breakdown crying in chair "I don't like having this facade for my family that its going to be OK, I don't like hearing that its going to be OK when its not! They don't walk in my shoes!"      General Comments      Exercises     Assessment/Plan    PT Assessment Patient needs continued PT services  PT Problem List Decreased mobility;Decreased balance;Decreased activity tolerance;Decreased knowledge of use of DME;Decreased safety awareness;Decreased coordination       PT Treatment Interventions Therapeutic exercise;Gait training;Balance training;Stair training;Functional mobility training;Therapeutic activities;DME instruction;Patient/family education    PT Goals (Current goals can be found in the Care Plan section)  Acute Rehab PT Goals Patient Stated Goal: to get well again PT Goal Formulation: With patient Time For Goal Achievement: 08/11/20 Potential to Achieve Goals:  Fair    Frequency Min 4X/week   Barriers to discharge Decreased caregiver support      Co-evaluation               AM-PAC PT "6 Clicks" Mobility  Outcome Measure Help needed turning from your back to your side while in a flat bed without using bedrails?: None Help needed moving from lying on your back to sitting on the side of a flat bed without using bedrails?: None Help needed moving to and from a bed to a chair (including a wheelchair)?: A Little Help needed standing up from a chair using your arms (e.g., wheelchair or bedside chair)?: A Lot Help needed to walk in hospital room?: A Little Help needed climbing 3-5 steps with a railing? : A Little 6 Click Score: 19    End of Session Equipment Utilized During Treatment: Gait belt;Other (comment) (wound vac) Activity Tolerance: Patient tolerated treatment well;Patient limited by pain Patient left: in chair;with call bell/phone within reach Nurse Communication: Mobility status;Other (comment);Weight bearing status;Precautions (would benefit greatly from talking with chaplain) PT Visit Diagnosis: Unsteadiness on feet (R26.81);Difficulty in walking, not elsewhere classified (R26.2);Pain Pain - Right/Left: Left Pain - part of body: Ankle and joints of foot    Time: 1025-8527 PT Time Calculation (min) (ACUTE ONLY): 28 min   Charges:   PT Evaluation $PT Eval Moderate Complexity: 1 Mod PT Treatments $  Gait Training: 8-22 mins        Windell Norfolk, DPT, PN1   Supplemental Physical Therapist [place]    Pager [phone] Acute Rehab Office [phone]

## 2020-07-28 NOTE — Progress Notes (Signed)
[facility] for Infectious Disease  Date of Admission:  07/25/2020     Total days of antibiotics 8         ASSESSMENT:  Richard [name] is POD #1 from 4/5th toe amputation and having issues with pain control. Per surgical notes margins were not clear and will proceed with IV antibiotics. Discussed PICC lines and antibiotic therapy. Will continue current dose of vancomycin and ceftriaxone and may be able to change to Daptomycin and ceftriaxone prior to discharge to ease the home regimen. Was not bacteremic so okay for PICC line placement. Surgical cultures are pending and will monitor for organism growth and narrow antibiotics as able. Pain management per primary team and wound care per orthopedics.   PLAN:  1. Continue current dose of vancomycin and ceftriaxone.  2. PICC line placement.  3. Pain management per primary team.  4. Wound care per Orthopedics.   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 aspirin  81 mg Oral Daily  . atorvastatin  40 mg Oral Daily  . enoxaparin (LOVENOX) injection  40 mg Subcutaneous Q24H  . ibuprofen  600 mg Oral QID  . insulin aspart  0-15 Units Subcutaneous TID WC  . insulin glargine  10 Units Subcutaneous QHS  . latanoprost  1 drop Both Eyes QHS  . mometasone-formoterol  2 puff Inhalation BID  . pregabalin  100 mg Oral TID    SUBJECTIVE:  Afebrile overnight. POD 1 from amputation and had increased pain levels last night with difficulty sleeping.   No Known Allergies   Review of Systems: Review of Systems  Constitutional: Negative for chills, fever and weight loss.  Respiratory: Negative for cough, shortness of breath and wheezing.   Cardiovascular: Negative for  chest pain and leg swelling.  Gastrointestinal: Negative for abdominal pain, constipation, diarrhea, nausea and vomiting.  Musculoskeletal:       Left foot pain  Skin: Negative for rash.      OBJECTIVE: Vitals:   07/27/20 2039 07/28/20 0100 07/28/20 0736 07/28/20 1154  BP:  138/80  127/76  Pulse:  85 87 87  Resp:  18 16 18   Temp:  98.2 F (36.8 C)  99 F (37.2 C)  TempSrc:  Oral  Oral  SpO2: 95% 99% 95% 98%  Weight:      Height:       Body mass index is 29.07 kg/m.  Physical Exam Constitutional:      General: Richard [name] is not in acute distress.    Appearance: Richard [name] is well-developed.  Cardiovascular:     Rate and Rhythm: Normal rate and regular rhythm.     Heart sounds: Normal heart sounds.  Pulmonary:     Effort: Pulmonary effort is normal.     Breath sounds: Normal breath sounds.  Musculoskeletal:     Comments: Wound VAC in place with no drainage in canister.   Skin:    General: Skin is warm and dry.  Neurological:     Mental Status: Richard [name] is alert and oriented to person, place, and time.  Psychiatric:        Behavior: Behavior normal.        Thought Content: Thought content normal.  Judgment: Judgment normal.     Lab Results Lab Results  Component Value Date   WBC 8.7 07/28/2020   HGB 11.0 (L) 07/28/2020   HCT 35.2 (L) 07/28/2020   MCV 83.0 07/28/2020   PLT 399 07/28/2020    Lab Results  Component Value Date   CREATININE 0.82 07/28/2020   BUN 13 07/28/2020   NA 135 07/28/2020   K 4.4 07/28/2020   CL 103 07/28/2020   CO2 25 07/28/2020    Lab Results  Component Value Date   ALT 21 07/25/2020   AST 19 07/25/2020   ALKPHOS 129 (H) 07/25/2020   BILITOT 0.6 07/25/2020     Microbiology: Recent Results (from the past 240 hour(s))  Respiratory Panel by RT PCR (Flu A&B, Covid) - Nasopharyngeal Swab     Status: None   Collection Time: 07/19/20  4:52 PM   Specimen: Nasopharyngeal Swab  Result Value Ref Range Status   SARS Coronavirus 2 by RT PCR  NEGATIVE NEGATIVE Final    Comment: (NOTE) SARS-CoV-2 target nucleic acids are NOT DETECTED.  The SARS-CoV-2 RNA is generally detectable in upper respiratoy specimens during the acute phase of infection. The lowest concentration of SARS-CoV-2 viral copies this assay can detect is 131 copies/mL. A negative result does not preclude SARS-Cov-2 infection and should not be used as the sole basis for treatment or other patient management decisions. A negative result may occur with  improper specimen collection/handling, submission of specimen other than nasopharyngeal swab, presence of viral mutation(s) within the areas targeted by this assay, and inadequate number of viral copies (<131 copies/mL). A negative result must be combined with clinical observations, patient history, and epidemiological information. The expected result is Negative.  Fact Sheet for Patients:  PinkCheek.be  Fact Sheet for Healthcare Providers:  GravelBags.it  This test is no t yet approved or cleared by the [place] FDA and  has been authorized for detection and/or diagnosis of SARS-CoV-2 by FDA under an Emergency Use Authorization (EUA). This EUA will remain  in effect (meaning this test can be used) for the duration of the COVID-19 declaration under Section 564(b)(1) of the Act, 21 U.S.C. section 360bbb-3(b)(1), unless the authorization is terminated or revoked sooner.     Influenza A by PCR NEGATIVE NEGATIVE Final   Influenza B by PCR NEGATIVE NEGATIVE Final    Comment: (NOTE) The Xpert Xpress SARS-CoV-2/FLU/RSV assay is intended as an aid in  the diagnosis of influenza from Nasopharyngeal swab specimens and  should not be used as a sole basis for treatment. Nasal washings and  aspirates are unacceptable for Xpert Xpress SARS-CoV-2/FLU/RSV  testing.  Fact Sheet for Patients: PinkCheek.be  Fact Sheet for  Healthcare Providers: GravelBags.it  This test is not yet approved or cleared by the [place] FDA and  has been authorized for detection and/or diagnosis of SARS-CoV-2 by  FDA under an Emergency Use Authorization (EUA). This EUA will remain  in effect (meaning this test can be used) for the duration of the  Covid-19 declaration under Section 564(b)(1) of the Act, 21  U.S.C. section 360bbb-3(b)(1), unless the authorization is  terminated or revoked. Performed at [facility], [place] [address], [place], [place] [address]   Blood culture (routine x 2)     Status: None   Collection Time: 07/19/20  8:15 PM   Specimen: BLOOD  Result Value Ref Range Status   Specimen Description BLOOD SITE NOT SPECIFIED  Final   Special Requests   Final  BOTTLES DRAWN AEROBIC AND ANAEROBIC Blood Culture adequate volume   Culture   Final    NO GROWTH 5 DAYS Performed at [facility], [place] [address], [place], [place] [address]    Report Status 07/24/2020 FINAL  Final  Respiratory Panel by RT PCR (Flu A&B, Covid) - Nasopharyngeal Swab     Status: None   Collection Time: 07/26/20  4:50 AM   Specimen: Nasopharyngeal Swab  Result Value Ref Range Status   SARS Coronavirus 2 by RT PCR NEGATIVE NEGATIVE Final    Comment: (NOTE) SARS-CoV-2 target nucleic acids are NOT DETECTED.  The SARS-CoV-2 RNA is generally detectable in upper respiratoy specimens during the acute phase of infection. The lowest concentration of SARS-CoV-2 viral copies this assay can detect is 131 copies/mL. A negative result does not preclude SARS-Cov-2 infection and should not be used as the sole basis for treatment or other patient management decisions. A negative result may occur with  improper specimen collection/handling, submission of specimen other than nasopharyngeal swab, presence of viral mutation(s) within the areas targeted by this assay, and inadequate number of viral copies (<131  copies/mL). A negative result must be combined with clinical observations, patient history, and epidemiological information. The expected result is Negative.  Fact Sheet for Patients:  PinkCheek.be  Fact Sheet for Healthcare Providers:  GravelBags.it  This test is no t yet approved or cleared by the [place] FDA and  has been authorized for detection and/or diagnosis of SARS-CoV-2 by FDA under an Emergency Use Authorization (EUA). This EUA will remain  in effect (meaning this test can be used) for the duration of the COVID-19 declaration under Section 564(b)(1) of the Act, 21 U.S.C. section 360bbb-3(b)(1), unless the authorization is terminated or revoked sooner.     Influenza A by PCR NEGATIVE NEGATIVE Final   Influenza B by PCR NEGATIVE NEGATIVE Final    Comment: (NOTE) The Xpert Xpress SARS-CoV-2/FLU/RSV assay is intended as an aid in  the diagnosis of influenza from Nasopharyngeal swab specimens and  should not be used as a sole basis for treatment. Nasal washings and  aspirates are unacceptable for Xpert Xpress SARS-CoV-2/FLU/RSV  testing.  Fact Sheet for Patients: PinkCheek.be  Fact Sheet for Healthcare Providers: GravelBags.it  This test is not yet approved or cleared by the [place] FDA and  has been authorized for detection and/or diagnosis of SARS-CoV-2 by  FDA under an Emergency Use Authorization (EUA). This EUA will remain  in effect (meaning this test can be used) for the duration of the  Covid-19 declaration under Section 564(b)(1) of the Act, 21  U.S.C. section 360bbb-3(b)(1), unless the authorization is  terminated or revoked. Performed at [facility], [place] [address] [place], [place] [address]   MRSA PCR Screening     Status: None   Collection Time: 07/27/20  4:02 PM   Specimen: Nasal Mucosa; Nasopharyngeal  Result Value Ref  Range Status   MRSA by PCR NEGATIVE NEGATIVE Final    Comment:        The GeneXpert MRSA Assay (FDA approved for NASAL specimens only), is one component of a comprehensive MRSA colonization surveillance program. It is not intended to diagnose MRSA infection nor to guide or monitor treatment for MRSA infections. Performed at [facility], [place] [address], [place], [place] [address]      Terri Piedra, [facility] Infectious [facility]  07/28/2020  12:28 PM

## 2020-07-28 NOTE — Progress Notes (Signed)
Internal Medicine Clinic Attending  I saw and evaluated the patient.  I personally confirmed the key portions of the history and exam documented by Dr. Amponsah and I reviewed pertinent patient test results.  The assessment, diagnosis, and plan were formulated together and I agree with the documentation in the resident's note.  

## 2020-07-28 NOTE — Progress Notes (Signed)
Pt reeling off the bed in what he described as pain. MD notified and N.O. implemented. Went to room pt asleep and then eyes opened and he began to reel of the bed once more. Pain med given via IV. Monitoring continues.

## 2020-07-28 NOTE — Progress Notes (Signed)
Patient ID: Richard [name], male   DOB: October 10, 1960, 59 y.o.   MRN: [id number] Patient is a 59 year old gentleman who is postop day one fourth and fifth ray amputation.  The margins were not clear this was debrided of infected tissue.  Cultures are pending.  Patient will need prolonged oral antibiotic coverage.  There is no drainage in the wound VAC canister.

## 2020-07-28 NOTE — Addendum Note (Signed)
Addended by: Aldine Contes on: 07/28/2020 09:09 AM   Modules accepted: Level of Service

## 2020-07-28 NOTE — Hospital Course (Signed)
His left leg appears considerably worse from time of discharge so he likely failed outpatient abx therapy. 1+ edema and erythema noted up to mid-shin of left leg with moderate TTP. Ulceration at sole of left foot (see images above). WBC 13.3, lactate 1. CRP 18.5, ESR 95. Patient does have hx of DM complicated by neuropathy. DG L foot showing possible erosion along medial aspect of 4th toe proximal phalanx adjacent to PIP joint, concerning for osteo. MRI showing large abscess with possible osteomyelitis. Orthopedics to perform I&D and partial foot amputation this afternoon.

## 2020-07-28 NOTE — Progress Notes (Signed)
Pharmacy Antibiotic Note  Richard [name] is a 59 y.o. male with foot abscess/osteomyelitis s/p toe amputation 10/20.  Pharmacy has been consulted for Vancomycin  dosing.  Plan: Vancomycin 2000 mg IV now, then 1500 mg IV q12h  Height: 6\' 2"  (188 cm) Weight: 102.7 kg (226 lb 6.6 oz) IBW/kg (Calculated) : 82.2  Temp (24hrs), Avg:98.5 F (36.9 C), Min:98.2 F (36.8 C), Max:98.6 F (37 C)  Recent Labs  Lab 07/22/20 0158 07/23/20 0559 07/25/20 1538 07/27/20 0421 07/28/20 0329  WBC 7.2 8.4 13.3* 6.9 8.7  CREATININE 0.86 0.81 1.10 0.89 0.82  LATICACIDVEN  --   --  1.0  --   --     Estimated Creatinine Clearance: 121 mL/min (by C-G formula based on SCr of 0.82 mg/dL).    No Known Allergies   Caryl Pina 07/28/2020 7:31 AM

## 2020-07-28 NOTE — Progress Notes (Signed)
Echocardiogram 2D Echocardiogram has been performed.  Oneal Deputy Naiomy Watters 07/28/2020, 2:07 PM

## 2020-07-29 ENCOUNTER — Inpatient Hospital Stay: Payer: Self-pay

## 2020-07-29 DIAGNOSIS — B9561 Methicillin susceptible Staphylococcus aureus infection as the cause of diseases classified elsewhere: Secondary | ICD-10-CM

## 2020-07-29 LAB — BASIC METABOLIC PANEL
Anion gap: 7 (ref 5–15)
BUN: 13 mg/dL (ref 8–23)
CO2: 26 mmol/L (ref 22–32)
Calcium: 8.5 mg/dL — ABNORMAL LOW (ref 8.9–10.3)
Chloride: 100 mmol/L (ref 98–111)
Creatinine, Ser: 0.98 mg/dL (ref 0.61–1.24)
GFR, Estimated: >60 mL/min (ref >60)
Glucose, Bld: 209 mg/dL — ABNORMAL HIGH (ref 70–99)
Potassium: 4.9 mmol/L (ref 3.5–5.1)
Sodium: 133 mmol/L — ABNORMAL LOW (ref 135–145)

## 2020-07-29 LAB — ECHOCARDIOGRAM COMPLETE
AR max vel: 1.95 cm2
AV Area VTI: 2.26 cm2
AV Area mean vel: 1.94 cm2
AV Mean grad: 9 mmHg
AV Peak grad: 15.7 mmHg
Ao pk vel: 1.98 m/s
Area-P 1/2: 2.5 cm2
Height: 74 in
S' Lateral: 3.3 cm
Weight: 3622.6 oz

## 2020-07-29 LAB — CBC
HCT: 34.3 % — ABNORMAL LOW (ref 39.0–52.0)
Hemoglobin: 10.7 g/dL — ABNORMAL LOW (ref 13.0–17.0)
MCH: 26 pg (ref 26.0–34.0)
MCHC: 31.2 g/dL (ref 30.0–36.0)
MCV: 83.5 fL (ref 80.0–100.0)
Platelets: 381 10*3/uL (ref 150–400)
RBC: 4.11 MIL/uL — ABNORMAL LOW (ref 4.22–5.81)
RDW: 13.6 % (ref 11.5–15.5)
WBC: 9.2 10*3/uL (ref 4.0–10.5)
nRBC: 0 % (ref 0.0–0.2)

## 2020-07-29 LAB — GLUCOSE, CAPILLARY
Glucose-Capillary: 196 mg/dL — ABNORMAL HIGH (ref 70–99)
Glucose-Capillary: 192 mg/dL — ABNORMAL HIGH (ref 70–99)
Glucose-Capillary: 188 mg/dL — ABNORMAL HIGH (ref 70–99)
Glucose-Capillary: 164 mg/dL — ABNORMAL HIGH (ref 70–99)

## 2020-07-29 MED ORDER — ACETAMINOPHEN 325 MG PO TABS
650.0000 mg | ORAL_TABLET | Freq: Four times a day (QID) | ORAL | Status: DC
  Administered 2020-07-29 – 2020-07-30 (×4): 650 mg via ORAL
  Filled 2020-07-29 (×4): qty 2

## 2020-07-29 MED ORDER — SODIUM CHLORIDE 0.9 % IV SOLN
750.0000 mg | Freq: Every day | INTRAVENOUS | Status: DC
  Administered 2020-07-29: 750 mg via INTRAVENOUS
  Filled 2020-07-29: qty 15

## 2020-07-29 MED ORDER — INSULIN GLARGINE 100 UNIT/ML ~~LOC~~ SOLN
12.0000 [IU] | Freq: Every day | SUBCUTANEOUS | Status: DC
  Administered 2020-07-29 – 2020-07-31 (×3): 12 [IU] via SUBCUTANEOUS
  Filled 2020-07-29 (×4): qty 0.12

## 2020-07-29 MED ORDER — CEFAZOLIN SODIUM-DEXTROSE 2-4 GM/100ML-% IV SOLN
2.0000 g | Freq: Three times a day (TID) | INTRAVENOUS | Status: DC
  Administered 2020-07-29 – 2020-07-30 (×3): 2 g via INTRAVENOUS
  Filled 2020-07-29 (×5): qty 100

## 2020-07-29 MED ORDER — SODIUM CHLORIDE 0.9 % IV SOLN
900.0000 mg | Freq: Every day | INTRAVENOUS | Status: DC
  Filled 2020-07-29: qty 18

## 2020-07-29 MED ORDER — SODIUM CHLORIDE 0.9 % IV SOLN
750.0000 mg | Freq: Every day | INTRAVENOUS | Status: DC
  Administered 2020-07-30 – 2020-08-01 (×3): 750 mg via INTRAVENOUS
  Filled 2020-07-29 (×4): qty 15

## 2020-07-29 NOTE — Progress Notes (Signed)
   07/29/20 1100  Clinical Encounter Type  Visited With Patient  Visit Type Spiritual support  Referral From Nurse  Consult/Referral To Chaplain  Spiritual Encounters  Spiritual Needs Prayer;Emotional  Stress Factors  Patient Stress Factors Family relationships;Health changes;Loss of control  Family Stress Factors None identified  Patient on consult. Chaplain visited patient today and listened to his story. Offered encouragement and spiritual support. Chaplain available if needed.

## 2020-07-29 NOTE — Plan of Care (Signed)
  Problem: Education: Goal: Knowledge of General Education information will improve Description: Including pain rating scale, medication(s)/side effects and non-pharmacologic comfort measures Outcome: Progressing   Problem: Clinical Measurements: Goal: Will remain free from infection Outcome: Progressing   

## 2020-07-29 NOTE — Progress Notes (Signed)
Physical Therapy Treatment Patient Details Name: Richard [name] MRN: [id number] DOB: 1960/12/15 Today's Date: 07/29/2020    History of Present Illness 59yo male with recent admit for L foot cellulitis, now returning with increased pain and edema in L foot despite being compliant with antibiotic/medication regimen. 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 PMH COPD, HTN, HLD, neuropathy, substance abuse, DM, cardiac cath    PT Comments    Patient received in chair with RN present and attending, much calmer today and very cooperative with therapy. Making great progress with safety with functional transfers and use of RW, however still limited to shorter gait distances due to pain and fatigue. Discussed case with Dr. Sharol Given prior to seeing and got verbal permission for heel WB with RW- patient much steadier when able to put heel down but still fatigues quite quickly. Introduced IT trainer, able to perform with B rails/MinA for balance and safety, also discussed alternative techniques for stair navigation including backwards with rails or RW, as well as lateral hops up/down the steps. Too fatigued to try further step training today. Left up in recliner with all needs met, chaplain present and attending. Making great progress.     Follow Up Recommendations  Home health PT;Supervision for mobility/OOB;Other (comment) (would benefit from short term PCA)     Equipment Recommendations  Rolling walker with 5" wheels;3in1 (PT) (potential WC pending progress)    Recommendations for Other Services       Precautions / Restrictions Precautions Precautions: Fall;Other (comment) Precaution Comments: very anxious and stressed right now, wound vac Restrictions Weight Bearing Restrictions: Yes LLE Weight Bearing: Non weight bearing Other Position/Activity Restrictions: but per Dr. Sharol Given in [place] secure chat, OK to WB through heel L LE    Mobility  Bed Mobility               General  bed mobility comments: OOB in chair with nursing  Transfers Overall transfer level: Needs assistance Equipment used: Rolling walker (2 wheeled) Transfers: Sit to/from Stand Sit to Stand: Min guard         General transfer comment: min guard for safety with great carryover of technique and hand placement from yesterday, no LOB today  Ambulation/Gait Ambulation/Gait assistance: Supervision Gait Distance (Feet): 30 Feet (58fx3) Assistive device: Rolling walker (2 wheeled) Gait Pattern/deviations: Step-to pattern;Decreased stance time - left;Decreased stride length;Trunk flexed;Antalgic Gait velocity: decreased   General Gait Details: able to tolerate a little bit of weight on L heel and demonstrates better balance with this technique, gait distance still limited due to pain and fatigue however   Stairs Stairs: Yes Stairs assistance: Min assist Stair Management: Two rails;Forwards Number of Stairs: 3 General stair comments: hop to pattern with B rails and MinA for balance, very effortful and became very fatigued; MinA for balance as he tends to turn a bit sideways in mid-air while hopping   Wheelchair Mobility    Modified Rankin (Stroke Patients Only)       Balance Overall balance assessment: Needs assistance Sitting-balance support: Bilateral upper extremity supported Sitting balance-Leahy Scale: Normal     Standing balance support: Bilateral upper extremity supported;During functional activity Standing balance-Leahy Scale: Fair Standing balance comment: reliant on BUE support                            Cognition Arousal/Alertness: Awake/alert Behavior During Therapy: WFL for tasks assessed/performed Overall Cognitive Status: Within Functional Limits for tasks assessed  General Comments: much calmer today, very pleasant and cooperative      Exercises      General Comments        Pertinent  Vitals/Pain Pain Assessment: 0-10 Pain Score: 5  Pain Location: L foot Pain Descriptors / Indicators: Aching;Sore;Sharp Pain Intervention(s): Limited activity within patient's tolerance;Monitored during session;Premedicated before session    Home Living                      Prior Function            PT Goals (current goals can now be found in the care plan section) Acute Rehab PT Goals Patient Stated Goal: to get well again PT Goal Formulation: With patient Time For Goal Achievement: 08/11/20 Potential to Achieve Goals: Fair Progress towards PT goals: Progressing toward goals    Frequency    Min 4X/week      PT Plan Current plan remains appropriate    Co-evaluation              AM-PAC PT "6 Clicks" Mobility   Outcome Measure  Help needed turning from your back to your side while in a flat bed without using bedrails?: None Help needed moving from lying on your back to sitting on the side of a flat bed without using bedrails?: None Help needed moving to and from a bed to a chair (including a wheelchair)?: A Little Help needed standing up from a chair using your arms (e.g., wheelchair or bedside chair)?: A Little Help needed to walk in hospital room?: A Little Help needed climbing 3-5 steps with a railing? : A Lot 6 Click Score: 19    End of Session Equipment Utilized During Treatment: Gait belt;Other (comment) (wound vac) Activity Tolerance: Patient tolerated treatment well;Patient limited by pain Patient left: in chair;with call bell/phone within reach;Other (comment) (chaplain present/attending) Nurse Communication: Mobility status PT Visit Diagnosis: Unsteadiness on feet (R26.81);Difficulty in walking, not elsewhere classified (R26.2);Pain Pain - Right/Left: Left Pain - part of body: Ankle and joints of foot     Time: 0017-4944 PT Time Calculation (min) (ACUTE ONLY): 38 min  Charges:  $Gait Training: 8-22 mins $Therapeutic Activity: 8-22  mins $Self Care/Home Management: 8-22                     Windell Norfolk, DPT, PN1   Supplemental Physical Therapist [place]    Pager [phone] Acute Rehab Office [phone]

## 2020-07-29 NOTE — Progress Notes (Signed)
POD 2 4th and 5th ray amputation. No complaints. Wants to DC home with home health   Vac working min in cannister   From ortho standpoint may DC . ASntibiotics per ID. No wouund care needed as patient has a vac

## 2020-07-29 NOTE — Progress Notes (Signed)
Subjective:  Patient is concerned that he will have all of the things in place at home prior to being discharged.    Antibiotics:  Anti-infectives (From admission, onward)   Start     Dose/Rate Route Frequency Ordered Stop   07/29/20 1400  ceFAZolin (ANCEF) IVPB 2g/100 mL premix        2 g 200 mL/hr over 30 Minutes Intravenous Every 8 hours 07/29/20 0934     07/29/20 1030  DAPTOmycin (CUBICIN) 900 mg in sodium chloride 0.9 % IVPB  Status:  Discontinued        900 mg 236 mL/hr over 30 Minutes Intravenous Daily 07/29/20 0934 07/29/20 0938   07/29/20 1030  DAPTOmycin (CUBICIN) 750 mg in sodium chloride 0.9 % IVPB        750 mg 230 mL/hr over 30 Minutes Intravenous Daily 07/29/20 0938     07/28/20 2200  vancomycin (VANCOREADY) IVPB 1500 mg/300 mL  Status:  Discontinued        1,500 mg 150 mL/hr over 120 Minutes Intravenous Every 12 hours 07/28/20 0733 07/29/20 0923   07/28/20 0830  vancomycin (VANCOREADY) IVPB 2000 mg/400 mL        2,000 mg 200 mL/hr over 120 Minutes Intravenous  Once 07/28/20 0733 07/29/20 0813   07/28/20 0800  cefTRIAXone (ROCEPHIN) 2 g in sodium chloride 0.9 % 100 mL IVPB  Status:  Discontinued        2 g 200 mL/hr over 30 Minutes Intravenous Every 24 hours 07/28/20 0656 07/29/20 0827   07/28/20 0600  ceFAZolin (ANCEF) IVPB 2g/100 mL premix        2 g 200 mL/hr over 30 Minutes Intravenous To Short Stay 07/27/20 1551 07/27/20 1737   07/26/20 1800  vancomycin (VANCOCIN) IVPB 1000 mg/200 mL premix  Status:  Discontinued        1,000 mg 200 mL/hr over 60 Minutes Intravenous Every 12 hours 07/26/20 0446 07/26/20 1551   07/26/20 0600  ceFEPIme (MAXIPIME) 2 g in sodium chloride 0.9 % 100 mL IVPB  Status:  Discontinued        2 g 200 mL/hr over 30 Minutes Intravenous Every 8 hours 07/26/20 0443 07/26/20 1551   07/26/20 0445  vancomycin (VANCOCIN) IVPB 1000 mg/200 mL premix        1,000 mg 200 mL/hr over 60 Minutes Intravenous  Once 07/26/20 0442 07/26/20  0653   07/26/20 0430  vancomycin (VANCOCIN) IVPB 1000 mg/200 mL premix        1,000 mg 200 mL/hr over 60 Minutes Intravenous  Once 07/26/20 0420 07/26/20 0551   07/26/20 0430  cefTRIAXone (ROCEPHIN) 1 g in sodium chloride 0.9 % 100 mL IVPB  Status:  Discontinued        1 g 200 mL/hr over 30 Minutes Intravenous  Once 07/26/20 0420 07/26/20 0435      Medications: Scheduled Meds: . aspirin  81 mg Oral Daily  . atorvastatin  40 mg Oral Daily  . enoxaparin (LOVENOX) injection  40 mg Subcutaneous Q24H  . ibuprofen  600 mg Oral QID  . insulin aspart  0-15 Units Subcutaneous TID WC  . insulin glargine  10 Units Subcutaneous QHS  . latanoprost  1 drop Both Eyes QHS  . mometasone-formoterol  2 puff Inhalation BID  . pregabalin  100 mg Oral TID   Continuous Infusions: .  ceFAZolin (ANCEF) IV    . DAPTOmycin (CUBICIN)  IV    . lactated ringers 10 mL/hr  at 07/27/20 1643   PRN Meds:.acetaminophen, albuterol, HYDROmorphone (DILAUDID) injection, oxyCODONE, polyethylene glycol    Objective: Weight change:   Intake/Output Summary (Last 24 hours) at 07/29/2020 1133 Last data filed at 07/29/2020 0542 Gross per 24 hour  Intake 700 ml  Output 1500 ml  Net -800 ml   Blood pressure 139/69, pulse 81, temperature 98.6 F (37 C), temperature source Oral, resp. rate 16, height 6\' 2"  (1.88 m), weight 102.7 kg, SpO2 97 %. Temp:  [98.1 F (36.7 C)-99.9 F (37.7 C)] 98.6 F (37 C) (10/22 0408) Pulse Rate:  [81-91] 81 (10/22 0728) Resp:  [16-18] 16 (10/22 0728) BP: (127-139)/(69-108) 139/69 (10/22 0408) SpO2:  [96 %-98 %] 97 % (10/22 0728)  Physical Exam: General: Alert and awake, oriented x3, not in any acute distress. HEENT: anicteric sclera, EOMI CVS regular rate, normal  Chest: , no wheezing, no respiratory distress Abdomen: soft non-distended,  Extremities: Foot with a vacuum dressing in place. Neuro: nonfocal  CBC:    BMET Recent Labs    07/28/20 0329 07/29/20 0402  NA 135  133*  K 4.4 4.9  CL 103 100  CO2 25 26  GLUCOSE 134* 209*  BUN 13 13  CREATININE 0.82 0.98  CALCIUM 8.5* 8.5*     Liver Panel  No results for input(s): PROT, ALBUMIN, AST, ALT, ALKPHOS, BILITOT, BILIDIR, IBILI in the last 72 hours.     Sedimentation Rate No results for input(s): ESRSEDRATE in the last 72 hours. C-Reactive Protein No results for input(s): CRP in the last 72 hours.  Micro Results: Recent Results (from the past 720 hour(s))  Respiratory Panel by RT PCR (Flu A&B, Covid) - Nasopharyngeal Swab     Status: None   Collection Time: 07/19/20  4:52 PM   Specimen: Nasopharyngeal Swab  Result Value Ref Range Status   SARS Coronavirus 2 by RT PCR NEGATIVE NEGATIVE Final    Comment: (NOTE) SARS-CoV-2 target nucleic acids are NOT DETECTED.  The SARS-CoV-2 RNA is generally detectable in upper respiratoy specimens during the acute phase of infection. The lowest concentration of SARS-CoV-2 viral copies this assay can detect is 131 copies/mL. A negative result does not preclude SARS-Cov-2 infection and should not be used as the sole basis for treatment or other patient management decisions. A negative result may occur with  improper specimen collection/handling, submission of specimen other than nasopharyngeal swab, presence of viral mutation(s) within the areas targeted by this assay, and inadequate number of viral copies (<131 copies/mL). A negative result must be combined with clinical observations, patient history, and epidemiological information. The expected result is Negative.  Fact Sheet for Patients:  PinkCheek.be  Fact Sheet for Healthcare Providers:  GravelBags.it  This test is no t yet approved or cleared by the [place] FDA and  has been authorized for detection and/or diagnosis of SARS-CoV-2 by FDA under an Emergency Use Authorization (EUA). This EUA will remain  in effect (meaning this  test can be used) for the duration of the COVID-19 declaration under Section 564(b)(1) of the Act, 21 U.S.C. section 360bbb-3(b)(1), unless the authorization is terminated or revoked sooner.     Influenza A by PCR NEGATIVE NEGATIVE Final   Influenza B by PCR NEGATIVE NEGATIVE Final    Comment: (NOTE) The Xpert Xpress SARS-CoV-2/FLU/RSV assay is intended as an aid in  the diagnosis of influenza from Nasopharyngeal swab specimens and  should not be used as a sole basis for treatment. Nasal washings and  aspirates are unacceptable for  Xpert Xpress SARS-CoV-2/FLU/RSV  testing.  Fact Sheet for Patients: PinkCheek.be  Fact Sheet for Healthcare Providers: GravelBags.it  This test is not yet approved or cleared by the [place] FDA and  has been authorized for detection and/or diagnosis of SARS-CoV-2 by  FDA under an Emergency Use Authorization (EUA). This EUA will remain  in effect (meaning this test can be used) for the duration of the  Covid-19 declaration under Section 564(b)(1) of the Act, 21  U.S.C. section 360bbb-3(b)(1), unless the authorization is  terminated or revoked. Performed at [facility], [place] [address] [place], [place] [address]   Blood culture (routine x 2)     Status: None   Collection Time: 07/19/20  8:15 PM   Specimen: BLOOD  Result Value Ref Range Status   Specimen Description BLOOD SITE NOT SPECIFIED  Final   Special Requests   Final    BOTTLES DRAWN AEROBIC AND ANAEROBIC Blood Culture adequate volume   Culture   Final    NO GROWTH 5 DAYS Performed at [facility], [address] [place], [place] [address]    Report Status 07/24/2020 FINAL  Final  Respiratory Panel by RT PCR (Flu A&B, Covid) - Nasopharyngeal Swab     Status: None   Collection Time: 07/26/20  4:50 AM   Specimen: Nasopharyngeal Swab  Result Value Ref Range Status   SARS Coronavirus 2 by RT PCR NEGATIVE NEGATIVE  Final    Comment: (NOTE) SARS-CoV-2 target nucleic acids are NOT DETECTED.  The SARS-CoV-2 RNA is generally detectable in upper respiratoy specimens during the acute phase of infection. The lowest concentration of SARS-CoV-2 viral copies this assay can detect is 131 copies/mL. A negative result does not preclude SARS-Cov-2 infection and should not be used as the sole basis for treatment or other patient management decisions. A negative result may occur with  improper specimen collection/handling, submission of specimen other than nasopharyngeal swab, presence of viral mutation(s) within the areas targeted by this assay, and inadequate number of viral copies (<131 copies/mL). A negative result must be combined with clinical observations, patient history, and epidemiological information. The expected result is Negative.  Fact Sheet for Patients:  PinkCheek.be  Fact Sheet for Healthcare Providers:  GravelBags.it  This test is no t yet approved or cleared by the [place] FDA and  has been authorized for detection and/or diagnosis of SARS-CoV-2 by FDA under an Emergency Use Authorization (EUA). This EUA will remain  in effect (meaning this test can be used) for the duration of the COVID-19 declaration under Section 564(b)(1) of the Act, 21 U.S.C. section 360bbb-3(b)(1), unless the authorization is terminated or revoked sooner.     Influenza A by PCR NEGATIVE NEGATIVE Final   Influenza B by PCR NEGATIVE NEGATIVE Final    Comment: (NOTE) The Xpert Xpress SARS-CoV-2/FLU/RSV assay is intended as an aid in  the diagnosis of influenza from Nasopharyngeal swab specimens and  should not be used as a sole basis for treatment. Nasal washings and  aspirates are unacceptable for Xpert Xpress SARS-CoV-2/FLU/RSV  testing.  Fact Sheet for Patients: PinkCheek.be  Fact Sheet for Healthcare  Providers: GravelBags.it  This test is not yet approved or cleared by the [place] FDA and  has been authorized for detection and/or diagnosis of SARS-CoV-2 by  FDA under an Emergency Use Authorization (EUA). This EUA will remain  in effect (meaning this test can be used) for the duration of the  Covid-19 declaration under Section 564(b)(1)  of the Act, 21  U.S.C. section 360bbb-3(b)(1), unless the authorization is  terminated or revoked. Performed at [facility], [place] [address], [place], [place] [address]   MRSA PCR Screening     Status: None   Collection Time: 07/27/20  4:02 PM   Specimen: Nasal Mucosa; Nasopharyngeal  Result Value Ref Range Status   MRSA by PCR NEGATIVE NEGATIVE Final    Comment:        The GeneXpert MRSA Assay (FDA approved for NASAL specimens only), is one component of a comprehensive MRSA colonization surveillance program. It is not intended to diagnose MRSA infection nor to guide or monitor treatment for MRSA infections. Performed at [facility], [place] [address] [place], [place] [address]   Aerobic/Anaerobic Culture (surgical/deep wound)     Status: None (Preliminary result)   Collection Time: 07/27/20  5:49 PM   Specimen: Soft Tissue, Other  Result Value Ref Range Status   Specimen Description TISSUE LEFT FOOT  Final   Special Requests NONE  Final   Gram Stain   Final    RARE WBC PRESENT, PREDOMINANTLY PMN NO ORGANISMS SEEN    Culture   Final    RARE STAPHYLOCOCCUS AUREUS SUSCEPTIBILITIES TO FOLLOW CRITICAL RESULT CALLED TO, READ BACK BY AND VERIFIED WITH: RN Armida Sans [id number] FCP Performed at [facility], [place] [address], [place],  [address]    Report Status PENDING  Incomplete    Studies/Results: ECHOCARDIOGRAM COMPLETE  Result Date: 07/29/2020    ECHOCARDIOGRAM REPORT   Patient Name:   Richard [name] Date of Exam: 07/28/2020 Medical Rec #:  [id number]       Height:        74.0 in Accession #:    [id number]      Weight:       226.4 lb Date of Birth:  03-21-1961        BSA:          2.291 m Patient Age:    62 years        BP:           127/76 mmHg Patient Gender: M               HR:           85 bpm. Exam Location:  Inpatient Procedure: 2D Echo, Color Doppler and Cardiac Doppler Indications:    R01.1 Murmur  History:        Patient has prior history of Echocardiogram examinations, most                 recent 09/25/2015. CAD, PAD and COPD; Risk Factors:Hypertension,                 Diabetes and Dyslipidemia.  Sonographer:    Raquel Sarna Senior RDCS Referring Phys: [id number] Oda Kilts  Sonographer Comments: Suboptimal apical window, did not reposition due to patient pain level. IMPRESSIONS  1. Left ventricular ejection fraction, by estimation, is 55 to 60%. The left ventricle has normal function. The left ventricle has no regional wall motion abnormalities. There is moderate concentric left ventricular hypertrophy. Left ventricular diastolic parameters are consistent with Grade I diastolic dysfunction (impaired relaxation). Elevated left atrial pressure.  2. Right ventricular systolic function is normal. The right ventricular size is normal. There is normal pulmonary artery systolic pressure.  3. Left atrial size was mildly dilated.  4. The mitral valve is normal in structure. No evidence of mitral valve  regurgitation. Mild to moderate mitral stenosis. The mean mitral valve gradient is 5.0 mmHg.  5. Aortic valve is possibly bicuspid. The aortic valve has an indeterminant number of cusps. There is moderate calcification of the aortic valve. There is moderate thickening of the aortic valve. Aortic valve regurgitation is not visualized. Mild aortic  valve stenosis.  6. The inferior vena cava is normal in size with greater than 50% respiratory variability, suggesting right atrial pressure of 3 mmHg. FINDINGS  Left Ventricle: Left ventricular ejection fraction, by estimation, is 55 to 60%.  The left ventricle has normal function. The left ventricle has no regional wall motion abnormalities. The left ventricular internal cavity size was normal in size. There is  moderate concentric left ventricular hypertrophy. Left ventricular diastolic parameters are consistent with Grade I diastolic dysfunction (impaired relaxation). Elevated left atrial pressure. Right Ventricle: The right ventricular size is normal. No increase in right ventricular wall thickness. Right ventricular systolic function is normal. There is normal pulmonary artery systolic pressure. Left Atrium: Left atrial size was mildly dilated. Right Atrium: Right atrial size was normal in size. Pericardium: There is no evidence of pericardial effusion. Mitral Valve: The mitral valve is normal in structure. No evidence of mitral valve regurgitation. Mild to moderate mitral valve stenosis. MV peak gradient, 8.6 mmHg. The mean mitral valve gradient is 5.0 mmHg with average heart rate of 7 bpm. Tricuspid Valve: The tricuspid valve is normal in structure. Tricuspid valve regurgitation is not demonstrated. No evidence of tricuspid stenosis. Aortic Valve: Aortic valve is possibly bicuspid. The aortic valve has an indeterminant number of cusps. There is moderate calcification of the aortic valve. There is moderate thickening of the aortic valve. Aortic valve regurgitation is not visualized. Mild aortic stenosis is present. Aortic valve mean gradient measures 9.0 mmHg. Aortic valve peak gradient measures 15.7 mmHg. Aortic valve area, by VTI measures 2.26 cm. Pulmonic Valve: The pulmonic valve was normal in structure. Pulmonic valve regurgitation is not visualized. No evidence of pulmonic stenosis. Aorta: The aortic root is normal in size and structure. Venous: The inferior vena cava is normal in size with greater than 50% respiratory variability, suggesting right atrial pressure of 3 mmHg. IAS/Shunts: No atrial level shunt detected by color flow Doppler.   LEFT VENTRICLE PLAX 2D LVIDd:         4.60 cm  Diastology LVIDs:         3.30 cm  LV e' medial:    5.11 cm/s LV PW:         1.30 cm  LV E/e' medial:  23.9 LV IVS:        1.30 cm  LV e' lateral:   7.07 cm/s LVOT diam:     2.40 cm  LV E/e' lateral: 17.3 LV SV:         88 LV SV Index:   38 LVOT Area:     4.52 cm  RIGHT VENTRICLE RV S prime:     13.10 cm/s TAPSE (M-mode): 3.1 cm LEFT ATRIUM             Index       RIGHT ATRIUM           Index LA diam:        3.90 cm 1.70 cm/m  RA Area:     15.00 cm LA Vol (A2C):   66.4 ml 28.98 ml/m RA Volume:   36.90 ml  16.11 ml/m LA Vol (A4C):   58.1 ml 25.36 ml/m LA  Biplane Vol: 63.2 ml 27.58 ml/m  AORTIC VALVE AV Area (Vmax):    1.95 cm AV Area (Vmean):   1.94 cm AV Area (VTI):     2.26 cm AV Vmax:           198.00 cm/s AV Vmean:          138.000 cm/s AV VTI:            0.388 m AV Peak Grad:      15.7 mmHg AV Mean Grad:      9.0 mmHg LVOT Vmax:         85.20 cm/s LVOT Vmean:        59.200 cm/s LVOT VTI:          0.194 m LVOT/AV VTI ratio: 0.50  AORTA Ao Root diam: 3.60 cm MITRAL VALVE MV Area (PHT): 2.50 cm     SHUNTS MV Peak grad:  8.6 mmHg     Systemic VTI:  0.19 m MV Mean grad:  5.0 mmHg     Systemic Diam: 2.40 cm MV Vmax:       1.47 m/s MV Vmean:      112.0 cm/s MV Decel Time: 303 msec MV E velocity: 122.00 cm/s MV A velocity: 147.00 cm/s MV E/A ratio:  0.83 Ena Dawley MD Electronically signed by Ena Dawley MD Signature Date/Time: 07/29/2020/9:15:27 AM    Final       Assessment/Plan:  INTERVAL HISTORY: He is growing Staph aureus.   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 male with IVs mellitus with neuropathy and diabetic foot infection  with osteomyelitis status fourth and fifth ray amputation.  Margins were not clear.  Staph aureus is growing.  Switch to daptomycin from vancomycin and from ceftriaxone to cefazolin.  Hopefully he has an MSSA.  If he does not already have a central line I will put an order for a PICC.  I will order O PAT once we have final susceptibilities.  I plan on sendng him out with 4 weeks of postoperative antibiotics with visit in clinic with me   Richard [name] has an appointment on November 16 at 9:45 AM with Dr. Tommy Medal   The [facility] is located in the [facility] at  [place] in [place].  [address], which is located to the left of the elevators.  Phone: [phone]  Fax: [phone]  [email or web address]  He should arrive 15 minutes prior to his appt.  If susceptibility data not back today Dr. Gale Journey will follow up on his final susceptibility results and make final antibiotic recommendations this weekend.     LOS: 3 days   Richard [name] 07/29/2020, 11:33 AM

## 2020-07-29 NOTE — Progress Notes (Signed)
Occupational Therapy Evaluation Patient Details Name: Richard [name] MRN: [id number] DOB: [birth year] Today's Date: 07/29/2020    History of Present Illness 59yo male with recent admit for L foot cellulitis, now returning with increased pain and edema in L foot despite being compliant with antibiotic/medication regimen. 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 PMH COPD, HTN, HLD, neuropathy, substance abuse, DM, cardiac cath   Clinical Impression   PTA, pt independent. Lives with 29 yo grand daughter who is able to assist intermittently. Pt requires min a with ADL tasks and is at increased risk for falls during dynamic activities due to hesitancy of putting any weight through L heel this session. Feel pt would greatly benefit from [facility] after DC. Will follow acutely to facilitate safe DC home. Needs a 3 in1 for home use.     Follow Up Recommendations  Home health OT;Supervision - Intermittent    Equipment Recommendations  3 in 1 bedside commode ; w/c with cushion   Recommendations for Other Services       Precautions / Restrictions Precautions Precautions: Fall Precaution Comments: very anxious and stressed right now, wound vac Restrictions Weight Bearing Restrictions: Yes LLE Weight Bearing: Partial weight bearing (weight bearing through heel only)      Mobility Bed Mobility Overal bed mobility: Modified Independent                  Transfers Overall transfer level: Needs assistance Equipment used: Rolling walker (2 wheeled) Transfers: Sit to/from Stand Sit to Stand: Min guard              Balance Overall balance assessment: Needs assistance Sitting-balance support: Bilateral upper extremity supported Sitting balance-Leahy Scale: Good     Standing balance support: Bilateral upper extremity supported;During functional activity Standing balance-Leahy Scale: Fair Standing balance comment: reliant on BUE support                            ADL either performed or assessed with clinical judgement   ADL Overall ADL's : Needs assistance/impaired     Grooming: Set up;Sitting   Upper Body Bathing: Set up;Sitting   Lower Body Bathing: Minimal assistance;Sit to/from stand   Upper Body Dressing : Set up;Sitting   Lower Body Dressing: Moderate assistance;Sit to/from stand   Toilet Transfer: Min guard;Ambulation;RW;BSC (BSC over toilet)   Toileting- Clothing Manipulation and Hygiene: Min guard       Functional mobility during ADLs: Min guard;Rolling walker;Cueing for safety General ADL Comments: wound vac clipped to gown. Pt required cues for safety; wanted to stand to urinate but unable at this time     Vision Baseline Vision/History: Glaucoma Additional Comments: follows with eye doctor regularly     Perception     Praxis      Pertinent Vitals/Pain Pain Assessment: No/denies pain     Hand Dominance Left   Extremity/Trunk Assessment Upper Extremity Assessment Upper Extremity Assessment: Overall WFL for tasks assessed   Lower Extremity Assessment Lower Extremity Assessment: Defer to PT evaluation   Cervical / Trunk Assessment Cervical / Trunk Assessment: Normal   Communication     Cognition Arousal/Alertness: Awake/alert Behavior During Therapy: WFL for tasks assessed/performed Overall Cognitive Status: Within Functional Limits for tasks assessed                                 General Comments: impulsive at times;  decreased safety awareness   General Comments       Exercises     Shoulder Instructions      Home Living Family/patient expects to be discharged to:: Private residence Living Arrangements: Alone Available Help at Discharge:  (64 yo grand daughter with him; works 1am - 9 am) Type of Home:  (town house) Home Access: Stairs to enter Technical brewer of Steps: 3-4 Entrance Stairs-Rails: Right;Left Home Layout: Two level;1/2 bath on main level;Bed/bath  upstairs Alternate Level Stairs-Number of Steps: flight    Bathroom Shower/Tub: Corporate investment banker: Standard Bathroom Accessibility: Yes How Accessible: Accessible via walker Home Equipment: Grab bars - tub/shower;Crutches          Prior Functioning/Environment Level of Independence: Independent                 OT Problem List: Decreased strength;Decreased range of motion;Decreased activity tolerance;Impaired balance (sitting and/or standing);Decreased safety awareness;Decreased knowledge of use of DME or AE;Decreased knowledge of precautions;Obesity      OT Treatment/Interventions: Self-care/ADL training;Therapeutic exercise;DME and/or AE instruction;Therapeutic activities;Patient/family education;Balance training    OT Goals(Current goals can be found in the care plan section) Acute Rehab OT Goals Patient Stated Goal: to get well again OT Goal Formulation: With patient Time For Goal Achievement: 08/12/20 Potential to Achieve Goals: Good  OT Frequency: Min 2X/week   Barriers to D/C:            Co-evaluation              AM-PAC OT "6 Clicks" Daily Activity     Outcome Measure Help from another person eating meals?: None Help from another person taking care of personal grooming?: A Little Help from another person toileting, which includes using toliet, bedpan, or urinal?: A Little Help from another person bathing (including washing, rinsing, drying)?: A Little Help from another person to put on and taking off regular upper body clothing?: A Little Help from another person to put on and taking off regular lower body clothing?: A Lot 6 Click Score: 18   End of Session Equipment Utilized During Treatment: Gait belt;Rolling walker Nurse Communication: Mobility status  Activity Tolerance: Patient tolerated treatment well Patient left: in chair;with call bell/phone within reach  OT Visit Diagnosis: Other abnormalities of gait and mobility  (R26.89)                Time: 0034-9179 OT Time Calculation (min): 33 min Charges:  OT General Charges $OT Visit: 1 Visit OT Evaluation $OT Eval Moderate Complexity: 1 Mod OT Treatments $Self Care/Home Management : 8-22 mins  Maurie Boettcher, OT/L   Acute OT Clinical Specialist Acute Rehabilitation Services Pager [phone] Office [phone]   [facility] 07/29/2020, 3:21 PM

## 2020-07-29 NOTE — TOC Initial Note (Signed)
Transition of Care [facility]) - Initial/Assessment Note    Patient Details  Name: Richard [name] MRN: [id number] Date of Birth: 05-Oct-1961  Transition of Care [facility]) CM/SW Contact:    Marilu Favre, RN Phone Number: 07/29/2020, 3:14 PM  Clinical Narrative:                  Confirmed face sheet information with patient. Patient plans to return to home at discharge with home health.   Patient aware home health will not visit daily and will not stay for extended periods of time.   Discussed IV ABX at home. Explained patient will have infusion company [employer]. Pam with [facility] will come to patient's hospital room later today to provide teaching. Patient will have a [place] however [place] will not be present every time a dose of ABX is due. Patient voices understanding. Patient feels comfortable being taught IV ABX. He does have a neighbor who can assist if needed.    NCM ordered walker, wheel chair and 3 in1 with Freda Munro with [facility] .  Cory with [facility] accepted referral . Secure chatted MD for [facility] HHPT orders and face to face. Will need OPAT script also.  Expected Discharge Plan: [place]     Patient Goals and CMS Choice Patient states their goals for this hospitalization and ongoing recovery are:: to return to home CMS Medicare.gov Compare Post Acute Care list provided to:: Patient Choice offered to / list presented to : Patient  Expected Discharge Plan and Services Expected Discharge Plan: [place] Acute Care Choice: Home Health, Durable Medical Equipment Living arrangements for the past 2 months: Apartment                 DME Arranged: 3-N-1, Walker rolling, Wheelchair manual DME Agency: [employer] Date DME Agency Contacted: 07/29/20 Time DME Agency Contacted: [phone] Representative spoke with at DME Agency: Freda Munro HH Arranged: PT, RN [place] Agency: [place] Date [place]: 07/29/20 Time [place]: 64 Representative spoke with at [place]: [place] Arrangements/Services Living arrangements for the past 2 months: Apartment Lives with:: Self Patient language and need for interpreter reviewed:: Yes Do you feel safe going back to the place where you live?: Yes      Need for Family Participation in Patient Care: Yes (Comment) Care giver support system in place?: Yes (comment)   Criminal Activity/Legal Involvement Pertinent to Current Situation/Hospitalization: No - Comment as needed  Activities of Daily Living Home Assistive Devices/Equipment: CBG Meter ADL Screening (condition at time of admission) Patient's cognitive ability adequate to safely complete daily activities?: Yes Is the patient deaf or have difficulty hearing?: No Does the patient have difficulty seeing, even when wearing glasses/contacts?: Yes Does the patient have difficulty concentrating, remembering, or making decisions?: No Patient able to express need for assistance with ADLs?: No Does the patient have difficulty dressing or bathing?: No Independently performs ADLs?: Yes (appropriate for developmental age) Does the patient have difficulty walking or climbing stairs?: No Weakness of Legs: None Weakness of Arms/Hands: None  Permission Sought/Granted   Permission granted to share information with : No              Emotional Assessment Appearance:: Appears stated age Attitude/Demeanor/Rapport: Engaged Affect (typically observed): Accepting Orientation: : Oriented to Self, Oriented to Place, Oriented to  Time, Oriented to Situation Alcohol / Substance Use: Not Applicable Psych Involvement:  No (comment)  Admission diagnosis:  Osteomyelitis of foot ([place]) [M86.9] Subacute osteomyelitis of left foot [facility]) [R75.436] Patient Active Problem List   Diagnosis Date Noted  . Cutaneous abscess of left foot   . Osteomyelitis of foot ([place]) 07/26/2020  . Cellulitis 07/19/2020  .  Impingement syndrome of right shoulder 01/29/2020  . Lumbar strain 11/28/2018  . Constipation 09/26/2018  . Need for Tdap vaccination 09/19/2018  . Mediastinal mass 08/25/2018  . Cerumen impaction 08/21/2018  . Osteoarthritis of left knee 01/09/2018  . Peripheral arterial disease ([place]) 10/21/2017  . Erectile dysfunction 03/06/2017  . Abdominal aortic atherosclerosis ([place]) 01/18/2017  . Vitamin D deficiency 07/02/2016  . Chest pain 06/28/2016  . Tubular adenoma of colon 02/06/2016  . Diabetic neuropathy, painful ([place]) 11/30/2015  . Overweight (BMI 25.0-29.9) 11/30/2015  . CAD in native artery 10/20/2015  . 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 Essential hypertension   . 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 Asthma-COPD overlap syndrome ([place])    PCP:  [name], DO Pharmacy:   [facility], [place] - [address] AT [address] [place] [address] Phone: [phone] Fax: [phone]     Social Determinants of Health (SDOH) Interventions    Readmission Risk Interventions No flowsheet data found.

## 2020-07-29 NOTE — Progress Notes (Signed)
Aware of PICC order for long term IV antibiotics.  Rn notified that the PICC will not be placed today.  RN states pt has PIV working well and no plan to d/c today.

## 2020-07-29 NOTE — Progress Notes (Signed)
Subjective:   No acute overnight events.  Patient sitting in chair, reports that he feels good today. Was able to go up a few stairs today while working with PT, but has been a difficult process for him considering he is NWB in LLE. Reports his biggest concern is making sure that he has the required equipment at home to assist with daily activities.  Preliminary surgical cultures showing staph aureus, he will need PICC line for prolonged abx (about 4 weeks long, as per ID).  Objective:  Vital signs in last 24 hours: Vitals:   07/28/20 1652 07/28/20 2046 07/29/20 0003 07/29/20 0408  BP: 135/70  (!) 128/108 139/69  Pulse: 85 86 91 81  Resp: 18 18 16 16   Temp: 99.6 F (37.6 C)  98.1 F (36.7 C) 98.6 F (37 C)  TempSrc: Oral  Oral Oral  SpO2: 98% 98% 98% 96%  Weight:      Height:       General: well-nourished male, sitting up in bed, NAD HE: NCAT, EOMI CV: normal rate and regular rhythm, + systolic murmur Pulm: CTABL, no adventitious sounds noted Skin: wound vac in place on LLE with minimal drainage, able to wiggle toes, appears well perfused Neuro: AAOx3, no focal deficits noted.    CBC Latest Ref Rng & Units 07/29/2020 07/28/2020 07/27/2020  WBC 4.0 - 10.5 K/uL 9.2 8.7 6.9  Hemoglobin 13.0 - 17.0 g/dL 10.7(L) 11.0(L) 10.9(L)  Hematocrit 39 - 52 % 34.3(L) 35.2(L) 35.2(L)  Platelets 150 - 400 K/uL 381 399 365    BMP Latest Ref Rng & Units 07/29/2020 07/28/2020 07/27/2020  Glucose 70 - 99 mg/dL 209(H) 134(H) 141(H)  BUN 8 - 23 mg/dL 13 13 16   Creatinine 0.61 - 1.24 mg/dL 0.98 0.82 0.89  BUN/Creat Ratio 10 - 24 - - -  Sodium 135 - 145 mmol/L 133(L) 135 134(L)  Potassium 3.5 - 5.1 mmol/L 4.9 4.4 4.1  Chloride 98 - 111 mmol/L 100 103 101  CO2 22 - 32 mmol/L 26 25 25   Calcium 8.9 - 10.3 mg/dL 8.5(L) 8.5(L) 8.4(L)     Assessment/Plan:  Principal Problem:   Osteomyelitis of foot (HCC) Active Problems:   Essential hypertension   Uncontrolled type 2 diabetes  mellitus with ophthalmic complication, without long-term current use of insulin (HCC)   Diabetic neuropathy, painful ([place])   Peripheral arterial disease (HCC)   Cutaneous abscess of left foot  Richard [name] is a 59 year old male with PMH of T2DM with neuropathy, HTN, HLD, Asthma/COPD who presented to [facility] on 10/18 for readmission for his left leg nonpurulent cellulitis and possible new L foot osteomyelitis d/t likely outpatient antibiotic failure.  L foot osteomyelitis s/p forth and fifth ray ampuation  -margins not clear -awaiting finalized surgical cultures to narrow abx coverage -preliminary cx showing staph aureus -switched to daptomycin and cefazolin (will need prolonged abx therapy for about 1 month) -will need PICC line -NWB on LLE -wound vac as per ortho -pain management with tylenol and ibuprofen for mild pain, oxycodone 5mg  q6h prn for severe pain, IV dilaudid 0.5mg  q4h prn for breakthrough pain -working with PT/OT, recommending HH PT/OT with supervision for mobility/OOB -consulted TOC for home health needs  T2DM, chronic - on lantus 12 units daily - on SSI - restart home medications at discharge (Empagoflozin-Metformin 5-1000mg  BID, Pioglitazone 30mg  daily, dulaglutide injection weekly)  HLD, chronic - continue home lipitor 40mg  daily and ASA 81mg  daily  Asthma/COPD, chronic - continue albuterol nebs, dulera nebs -  restart home medications at discharge (montelukast 10mg , levocetirizine 5mg , albuterol nebs)  Systolic heart murmur - stable -ECHO showing G1DD, mild to moderate mitral stenosis, possible bicuspid aortic valve with mild aortic stenosis.  Prior to Admission Living Arrangement: Home Anticipated Discharge Location: Home Barriers to Discharge: none Dispo: TBD  Virl Axe, MD 07/29/2020, 7:28 AM Pager: [phone] After 5pm on weekdays and 1pm on weekends: On Call pager [phone]

## 2020-07-29 NOTE — Progress Notes (Signed)
Pharmacy Antibiotic Note  Richard [name] is a 59 y.o. male admitted on 07/25/2020 with osteomyelitis.  Pharmacy has been consulted for daptomycin and cefazolin dosing.    S/p fourth and fifth ray amputation on 10/20.  Margins were not clear and area was debrided. Plan for PICC line placement and 6 weeks IV antibiotics. Wound culture showing Staphylococcus aureus - sensitivities pending.  Cefazolin will cover for potential MSSA and daptomycin for potential MRSA.  Daptomycin was chosen over vancomycin for renal protection and ease of dosing at home.   Plan: D/c vancomycin, d/c ceftriaxone Start daptomycin 750mg  IV Q24 hours Start cefazolin 2g IV Q8 hours Monitor baseline CK and weekly thereafter F/u culture data, clinical improvement  Height: 6\' 2"  (188 cm) Weight: 102.7 kg (226 lb 6.6 oz) IBW/kg (Calculated) : 82.2  Temp (24hrs), Avg:99 F (37.2 C), Min:98.1 F (36.7 C), Max:99.9 F (37.7 C)  Recent Labs  Lab 07/23/20 0559 07/25/20 1538 07/27/20 0421 07/28/20 0329 07/29/20 0402  WBC 8.4 13.3* 6.9 8.7 9.2  CREATININE 0.81 1.10 0.89 0.82 0.98  LATICACIDVEN  --  1.0  --   --   --     Estimated Creatinine Clearance: 101.2 mL/min (by C-G formula based on SCr of 0.98 mg/dL).    No Known Allergies  Antimicrobials this admission: Vancomycin 10/19 >> 10/22 Ceftriaxone 10/21 >> 10/22 Daptomycin 10/22 >> Cefazolin 10/22 >>   Microbiology results: 10/12 BCx: ngtd (F) 10/20 Tissue L foot: Staphylococcus aures - sensitivities pending  Thank you for allowing pharmacy to be a part of this patient's care.  Dimple Nanas, PharmD PGY-1 Acute Care Pharmacy Resident Office: [phone] 07/29/2020 11:06 AM

## 2020-07-29 NOTE — Care Management (Signed)
    Durable Medical Equipment  (From admission, onward)         Start     Ordered   07/29/20 1452  For home use only DME standard manual wheelchair with seat cushion  Once       Comments: Patient suffers from  [place] ray LEFT foot which impairs their ability to perform daily activities like ambulating  in the home.  A cane will not resolve issue with performing activities of daily living. A wheelchair will allow patient to safely perform daily activities. Patient can safely propel the wheelchair in the home or has a caregiver who can provide assistance. Length of need lifetime. Accessories: elevating leg rests (ELRs), wheel locks, extensions and anti-tippers.  Seat and back cushions   07/29/20 1451   07/29/20 1450  For home use only DME Walker rolling  Once       Question Answer Comment  Walker: With [place]   Patient needs a walker to treat with the following condition Weakness      07/29/20 1449   07/29/20 1450  For home use only DME 3 n 1  Once        07/29/20 1449

## 2020-07-30 LAB — CBC WITH DIFFERENTIAL/PLATELET
Abs Immature Granulocytes: 0.18 10*3/uL — ABNORMAL HIGH (ref 0.00–0.07)
Basophils Absolute: 0.1 10*3/uL (ref 0.0–0.1)
Basophils Relative: 1 %
Eosinophils Absolute: 0.7 10*3/uL — ABNORMAL HIGH (ref 0.0–0.5)
Eosinophils Relative: 8 %
HCT: 32.7 % — ABNORMAL LOW (ref 39.0–52.0)
Hemoglobin: 10.4 g/dL — ABNORMAL LOW (ref 13.0–17.0)
Immature Granulocytes: 2 %
Lymphocytes Relative: 8 %
Lymphs Abs: 0.7 10*3/uL (ref 0.7–4.0)
MCH: 26.2 pg (ref 26.0–34.0)
MCHC: 31.8 g/dL (ref 30.0–36.0)
MCV: 82.4 fL (ref 80.0–100.0)
Monocytes Absolute: 0.8 10*3/uL (ref 0.1–1.0)
Monocytes Relative: 9 %
Neutro Abs: 6.2 10*3/uL (ref 1.7–7.7)
Neutrophils Relative %: 72 %
Platelets: 393 10*3/uL (ref 150–400)
RBC: 3.97 MIL/uL — ABNORMAL LOW (ref 4.22–5.81)
RDW: 13.5 % (ref 11.5–15.5)
WBC: 8.6 10*3/uL (ref 4.0–10.5)
nRBC: 0 % (ref 0.0–0.2)

## 2020-07-30 LAB — GLUCOSE, CAPILLARY
Glucose-Capillary: 152 mg/dL — ABNORMAL HIGH (ref 70–99)
Glucose-Capillary: 176 mg/dL — ABNORMAL HIGH (ref 70–99)
Glucose-Capillary: 144 mg/dL — ABNORMAL HIGH (ref 70–99)
Glucose-Capillary: 202 mg/dL — ABNORMAL HIGH (ref 70–99)

## 2020-07-30 LAB — CK: Total CK: 56 U/L (ref 49–397)

## 2020-07-30 LAB — BASIC METABOLIC PANEL
Anion gap: 7 (ref 5–15)
BUN: 14 mg/dL (ref 8–23)
CO2: 26 mmol/L (ref 22–32)
Calcium: 8.5 mg/dL — ABNORMAL LOW (ref 8.9–10.3)
Chloride: 101 mmol/L (ref 98–111)
Creatinine, Ser: 0.99 mg/dL (ref 0.61–1.24)
GFR, Estimated: >60 mL/min (ref >60)
Glucose, Bld: 220 mg/dL — ABNORMAL HIGH (ref 70–99)
Potassium: 4.5 mmol/L (ref 3.5–5.1)
Sodium: 134 mmol/L — ABNORMAL LOW (ref 135–145)

## 2020-07-30 MED ORDER — SODIUM CHLORIDE 0.9% FLUSH: 10.0000 mL | INTRAVENOUS | Status: DC | PRN

## 2020-07-30 MED ORDER — IBUPROFEN 400 MG PO TABS
600.0000 mg | ORAL_TABLET | Freq: Four times a day (QID) | ORAL | Status: DC
  Administered 2020-07-30 – 2020-08-01 (×9): 600 mg via ORAL
  Filled 2020-07-30 (×9): qty 1

## 2020-07-30 MED ORDER — ACETAMINOPHEN 325 MG PO TABS
650.0000 mg | ORAL_TABLET | Freq: Four times a day (QID) | ORAL | Status: DC
  Administered 2020-07-30 – 2020-08-01 (×8): 650 mg via ORAL
  Filled 2020-07-30 (×8): qty 2

## 2020-07-30 MED ORDER — CHLORHEXIDINE GLUCONATE CLOTH 2 % EX PADS
6.0000 | MEDICATED_PAD | Freq: Every day | CUTANEOUS | Status: DC
  Administered 2020-07-30 – 2020-08-01 (×2): 6 via TOPICAL

## 2020-07-30 NOTE — Progress Notes (Signed)
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 Tommy Medal   Final Antibiotics recommendation/duration: daptomycin 750 mg iv q24hours x6 weeks until 09/08/2020 (opat consult placed)  Lab Results  Component Value Date   CREATININE 0.99 07/30/2020     Labs Monitor (check all applied): _x_ weekly cbc, cmp, crp, cpk level __ weekly vancomycin __ trough or __ predialysis level __ other TDM    [facility] address: [facility] Located in: [facility] Address: [place], [place], [place] [address] Phone: [phone]

## 2020-07-30 NOTE — Progress Notes (Signed)
PT Cancellation Note  Patient Details Name: Ziaire Hagos MRN: [id number] DOB: 09-Feb-1961   Cancelled Treatment:    Reason Eval/Treat Not Completed: Patient at procedure or test/unavailable patient currently receiving sterile procedure, unavailable for PT session this morning. Will attempt to try back if time/schedule allow.    Windell Norfolk, DPT, PN1   Supplemental Physical Therapist [facility]    Pager [phone] Acute Rehab Office [phone]

## 2020-07-30 NOTE — Progress Notes (Signed)
NT in to see patient and complete VS. Patient was found to be choking on food. NT forcefully pat patient on back until food was cleared. Oxygen applied to provide comfort. Continuing to monitor.

## 2020-07-30 NOTE — Progress Notes (Signed)
Subjective:   No acute overnight events.  Reports doing well today. Able to move to chair. Went to the bathroom and able to put some weight on heel of left foot, although felt somewhat uncomfortable.   States that he begins to have more pain in the evening. Usually the pain in the day time is bearable but begins to worsen before bedtime. Changed scheduled pain medications to have him receive some around bedtime to help with this.  Objective:  Vital signs in last 24 hours: Vitals:   07/29/20 1152 07/29/20 1742 07/30/20 0034 07/30/20 0515  BP: 129/70 136/63 130/74 (!) 148/93  Pulse: 90 87 75 75  Resp: 20 18 16 15   Temp: 99.8 F (37.7 C) 98.6 F (37 C) 97.9 F (36.6 C) 98.5 F (36.9 C)  TempSrc: Oral Oral Oral Oral  SpO2: 96% 95%  97%  Weight:      Height:       General: well-nourished male, sitting up in bed, NAD HE: NCAT, EOMI CV: normal rate and regular rhythm, + systolic murmur Pulm: CTABL, no adventitious sounds noted Skin: wound vac in place on LLE with minimal drainage, able to wiggle toes, appears well perfused Neuro: AAOx3, no focal deficits noted   CBC Latest Ref Rng & Units 07/30/2020 07/29/2020 07/28/2020  WBC 4.0 - 10.5 K/uL 8.6 9.2 8.7  Hemoglobin 13.0 - 17.0 g/dL 10.4(L) 10.7(L) 11.0(L)  Hematocrit 39 - 52 % 32.7(L) 34.3(L) 35.2(L)  Platelets 150 - 400 K/uL 393 381 399    BMP Latest Ref Rng & Units 07/30/2020 07/29/2020 07/28/2020  Glucose 70 - 99 mg/dL 220(H) 209(H) 134(H)  BUN 8 - 23 mg/dL 14 13 13   Creatinine 0.61 - 1.24 mg/dL 0.99 0.98 0.82  BUN/Creat Ratio 10 - 24 - - -  Sodium 135 - 145 mmol/L 134(L) 133(L) 135  Potassium 3.5 - 5.1 mmol/L 4.5 4.9 4.4  Chloride 98 - 111 mmol/L 101 100 103  CO2 22 - 32 mmol/L 26 26 25   Calcium 8.9 - 10.3 mg/dL 8.5(L) 8.5(L) 8.5(L)     Assessment/Plan:  Principal Problem:   Osteomyelitis of foot (HCC) Active Problems:   Essential hypertension   Uncontrolled type 2 diabetes mellitus with ophthalmic  complication, without long-term current use of insulin (HCC)   Diabetic neuropathy, painful ([place])   Peripheral arterial disease (HCC)   Cutaneous abscess of left foot  Richard [name] is a 59 year old male with PMH of T2DM with neuropathy, HTN, HLD, Asthma/COPD who presented to [facility] on 10/18 for readmission for his left leg nonpurulent cellulitis and possible new L foot osteomyelitis d/t likely outpatient antibiotic failure.  L foot osteomyelitis s/p forth and fifth ray amputation (margins not clear) -surgical cultures showing staph aureus, awaiting susceptibilities -switched to daptomycin and cefazolin (will need prolonged abx therapy for about 1 month) -CK and kidney function wnl despite new addition of daptomycin, patient tolerating well -awaiting PICC line placement -NWB on LLE (except on heel) -wound vac as per ortho -pain management with tylenol and ibuprofen for mild pain, oxycodone 5mg  q6h prn for severe pain, IV dilaudid 0.5mg  q4h prn for breakthrough pain -working with PT/OT, recommending HH PT/OT with supervision for mobility/OOB -consulted TOC for home health needs  T2DM, chronic - on lantus 12 units daily - on SSI - restart home medications at discharge (Empagoflozin-Metformin 5-1000mg  BID, Pioglitazone 30mg  daily, dulaglutide injection weekly)  HLD, chronic - continue home lipitor 40mg  daily and ASA 81mg  daily  Asthma/COPD, chronic - continue albuterol nebs,  dulera nebs - restart home medications at discharge (montelukast 10mg , levocetirizine 5mg , albuterol nebs)  Systolic heart murmur - stable -ECHO showing G1DD, mild to moderate mitral stenosis, possible bicuspid aortic valve with mild aortic stenosis.  Prior to Admission Living Arrangement: Home Anticipated Discharge Location: Home Barriers to Discharge: continued medical management, awaiting susceptibilities for surgical cx Dispo: TBD  Richard Axe, MD 07/30/2020, 10:42 AM Pager: [phone] After 5pm on  weekdays and 1pm on weekends: On Call pager [phone]

## 2020-07-30 NOTE — Progress Notes (Signed)
Physical Therapy Treatment Patient Details Name: Jad Johansson MRN: [id number] DOB: 29-Jul-1961 Today's Date: 07/30/2020    History of Present Illness 59yo male with recent admit for L foot cellulitis, now returning with increased pain and edema in L foot despite being compliant with antibiotic/medication regimen. 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 PMH COPD, HTN, HLD, neuropathy, substance abuse, DM, cardiac cath    PT Comments    Patient received in recliner, in great spirits today and very cooperative with therapy. Continued practicing gait training and able to progress gait distance significantly today but did need cues for upright posture and technique to allow for more fluid and efficient gait pattern. Able to tolerate more weight on heel today. Also continued stair training, this time with repeated single step step-ups with RW and cues for technique and sequencing with significant fatigue. Good ability to maintain heel WB. Left up in recliner with all needs met. Currently planning for family education at 11am tomorrow.     Follow Up Recommendations  Home health PT;Supervision for mobility/OOB;Other (comment) (potential benefit from short term PCA)     Equipment Recommendations  Rolling walker with 5" wheels;3in1 (PT);Wheelchair (measurements PT);Wheelchair cushion (measurements PT)    Recommendations for Other Services       Precautions / Restrictions Precautions Precautions: Fall Precaution Comments: very anxious and stressed right now, wound vac Restrictions Weight Bearing Restrictions: Yes LLE Weight Bearing: Partial weight bearing (through heel only) LLE Partial Weight Bearing Percentage or Pounds: through heel only Other Position/Activity Restrictions: but per Dr. Sharol Given in [place] secure chat, OK to WB through heel L LE    Mobility  Bed Mobility               General bed mobility comments: OOB in chair  Transfers Overall transfer level: Needs  assistance Equipment used: Rolling walker (2 wheeled) Transfers: Sit to/from Stand Sit to Stand: Min guard         General transfer comment: min guard for safety, good hand placement and technique but did need cues from therapist to wait until PT was ready  Ambulation/Gait Ambulation/Gait assistance: Min guard Gait Distance (Feet): 75 Feet Assistive device: Rolling walker (2 wheeled) Gait Pattern/deviations: Step-to pattern;Decreased stance time - left;Decreased stride length;Trunk flexed;Antalgic Gait velocity: decreased   General Gait Details: able to better tolerate weight on L heel today, did need cues for improved knee flexion and upright posture to reduce limping and antalgic pattern with good tolerance but still fatigues easily   Stairs   Stairs assistance: Min guard Stair Management: Backwards Number of Stairs: 5 (single step 5x) General stair comments: practiced on single step with backwards ascent/forwards descent for improved BLE strength and endurance; cues for appropriate technique and sequencing   Wheelchair Mobility    Modified Rankin (Stroke Patients Only)       Balance Overall balance assessment: Needs assistance Sitting-balance support: Feet supported Sitting balance-Leahy Scale: Normal     Standing balance support: Bilateral upper extremity supported;During functional activity Standing balance-Leahy Scale: Fair Standing balance comment: reliant on BUE support                            Cognition Arousal/Alertness: Awake/alert Behavior During Therapy: WFL for tasks assessed/performed Overall Cognitive Status: Within Functional Limits for tasks assessed  General Comments: impulsive at times; decreased safety awareness, but improving      Exercises      General Comments        Pertinent Vitals/Pain Pain Assessment: Faces Faces Pain Scale: Hurts a little bit Pain Location: L  foot Pain Descriptors / Indicators: Aching;Sore Pain Intervention(s): Limited activity within patient's tolerance;Monitored during session;Premedicated before session    Home Living                      Prior Function            PT Goals (current goals can now be found in the care plan section) Acute Rehab PT Goals Patient Stated Goal: to get well again PT Goal Formulation: With patient Time For Goal Achievement: 08/11/20 Potential to Achieve Goals: Fair Progress towards PT goals: Progressing toward goals    Frequency    Min 4X/week      PT Plan Current plan remains appropriate    Co-evaluation              AM-PAC PT "6 Clicks" Mobility   Outcome Measure  Help needed turning from your back to your side while in a flat bed without using bedrails?: None Help needed moving from lying on your back to sitting on the side of a flat bed without using bedrails?: None Help needed moving to and from a bed to a chair (including a wheelchair)?: A Little Help needed standing up from a chair using your arms (e.g., wheelchair or bedside chair)?: A Little Help needed to walk in hospital room?: A Little Help needed climbing 3-5 steps with a railing? : A Lot 6 Click Score: 19    End of Session Equipment Utilized During Treatment: Gait belt (wound vac) Activity Tolerance: Patient tolerated treatment well;Patient limited by pain Patient left: in chair;with call bell/phone within reach Nurse Communication: Mobility status PT Visit Diagnosis: Unsteadiness on feet (R26.81);Difficulty in walking, not elsewhere classified (R26.2);Pain Pain - Right/Left: Left Pain - part of body: Ankle and joints of foot     Time: 1517-6160 PT Time Calculation (min) (ACUTE ONLY): 32 min  Charges:  $Gait Training: 23-37 mins                     Windell Norfolk, DPT, PN1   Supplemental Physical Therapist [place]    Pager [phone] Acute Rehab Office [phone]

## 2020-07-30 NOTE — Progress Notes (Signed)
PHARMACY CONSULT NOTE FOR:  OUTPATIENT  PARENTERAL ANTIBIOTIC THERAPY (OPAT)  Indication: Osteomyelitis Regimen: Daptomycin 750mg  IV Q24H End date: 09/08/2020  IV antibiotic discharge orders are pended. To discharging provider:  please sign these orders via discharge navigator,  Select New Orders & click on the button choice - Manage This Unsigned Work.     Thank you for allowing pharmacy to be a part of this patient's care.  Cephus Slater, PharmD, [facility] Resident [phone] 07/30/2020 2:59 PM

## 2020-07-30 NOTE — Progress Notes (Signed)
Peripherally Inserted Central Catheter Placement  The IV Nurse has discussed with the patient and/or persons authorized to consent for the patient, the purpose of this procedure and the potential benefits and risks involved with this procedure.  The benefits include less needle sticks, lab draws from the catheter, and the patient may be discharged home with the catheter. Risks include, but not limited to, infection, bleeding, blood clot (thrombus formation), and puncture of an artery; nerve damage and irregular heartbeat and possibility to perform a PICC exchange if needed/ordered by physician.  Alternatives to this procedure were also discussed.  Bard Power PICC patient education guide, fact sheet on infection prevention and patient information card has been provided to patient /or left at bedside.    PICC Placement Documentation  PICC Single Lumen 07/30/20 PICC Right Brachial 43 cm 0 cm (Active)  Indication for Insertion or Continuance of Line Home intravenous therapies (PICC only) 07/30/20 1112  Exposed Catheter (cm) 0 cm 07/30/20 1112  Site Assessment Clean;Dry;Intact 07/30/20 1112  Line Status Flushed;Saline locked;Blood return noted 07/30/20 1112  Dressing Type Transparent 07/30/20 1112  Dressing Status Clean;Dry;Intact 07/30/20 1112  Antimicrobial disc in place? Yes 07/30/20 1112  Safety Lock Not Applicable 35/78/97 8478  Line Care Connections checked and tightened;Tubing changed 07/30/20 1112  Line Adjustment (NICU/IV Team Only) No 07/30/20 1112  Dressing Intervention New dressing 07/30/20 1112  Dressing Change Due 08/06/20 07/30/20 1112       Rolena Infante 07/30/2020, 11:13 AM

## 2020-07-31 ENCOUNTER — Inpatient Hospital Stay (HOSPITAL_COMMUNITY): Payer: Medicare Other

## 2020-07-31 DIAGNOSIS — R0989 Other specified symptoms and signs involving the circulatory and respiratory systems: Secondary | ICD-10-CM

## 2020-07-31 LAB — CBC WITH DIFFERENTIAL/PLATELET
Abs Immature Granulocytes: 0.13 10*3/uL — ABNORMAL HIGH (ref 0.00–0.07)
Basophils Absolute: 0.1 10*3/uL (ref 0.0–0.1)
Basophils Relative: 1 %
Eosinophils Absolute: 0.5 10*3/uL (ref 0.0–0.5)
Eosinophils Relative: 7 %
HCT: 34 % — ABNORMAL LOW (ref 39.0–52.0)
Hemoglobin: 10.5 g/dL — ABNORMAL LOW (ref 13.0–17.0)
Immature Granulocytes: 2 %
Lymphocytes Relative: 11 %
Lymphs Abs: 0.8 10*3/uL (ref 0.7–4.0)
MCH: 25.8 pg — ABNORMAL LOW (ref 26.0–34.0)
MCHC: 30.9 g/dL (ref 30.0–36.0)
MCV: 83.5 fL (ref 80.0–100.0)
Monocytes Absolute: 0.7 10*3/uL (ref 0.1–1.0)
Monocytes Relative: 9 %
Neutro Abs: 5.3 10*3/uL (ref 1.7–7.7)
Neutrophils Relative %: 70 %
Platelets: 412 10*3/uL — ABNORMAL HIGH (ref 150–400)
RBC: 4.07 MIL/uL — ABNORMAL LOW (ref 4.22–5.81)
RDW: 13.7 % (ref 11.5–15.5)
WBC: 7.6 10*3/uL (ref 4.0–10.5)
nRBC: 0 % (ref 0.0–0.2)

## 2020-07-31 LAB — BASIC METABOLIC PANEL
Anion gap: 9 (ref 5–15)
BUN: 13 mg/dL (ref 8–23)
CO2: 26 mmol/L (ref 22–32)
Calcium: 8.7 mg/dL — ABNORMAL LOW (ref 8.9–10.3)
Chloride: 100 mmol/L (ref 98–111)
Creatinine, Ser: 0.84 mg/dL (ref 0.61–1.24)
GFR, Estimated: >60 mL/min (ref >60)
Glucose, Bld: 161 mg/dL — ABNORMAL HIGH (ref 70–99)
Potassium: 4.2 mmol/L (ref 3.5–5.1)
Sodium: 135 mmol/L (ref 135–145)

## 2020-07-31 LAB — GLUCOSE, CAPILLARY
Glucose-Capillary: 121 mg/dL — ABNORMAL HIGH (ref 70–99)
Glucose-Capillary: 134 mg/dL — ABNORMAL HIGH (ref 70–99)
Glucose-Capillary: 186 mg/dL — ABNORMAL HIGH (ref 70–99)
Glucose-Capillary: 204 mg/dL — ABNORMAL HIGH (ref 70–99)

## 2020-07-31 NOTE — Progress Notes (Signed)
Physical Therapy Treatment Patient Details Name: Richard [name] MRN: [id number] DOB: [birth year] Today's Date: 07/31/2020    History of Present Illness 59yo male with recent admit for L foot cellulitis, now returning with increased pain and edema in L foot despite being compliant with antibiotic/medication regimen. 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 PMH COPD, HTN, HLD, neuropathy, substance abuse, DM, cardiac cath    PT Comments    Pt progressing well towards his physical therapy goals. Session focused on stair training prior to discharge home with pt daughter/granddaughter present. Pt negotiated 4 steps sideways with left railing, with cues provided for method and weightbearing status. Pt family verbalized understanding of guarding technique. Pt with some concerns regarding LLE swelling post session; elevated on pillows. D/c plan remains appropriate.    Follow Up Recommendations  Home health PT;Supervision for mobility/OOB     Equipment Recommendations  Rolling walker with 5" wheels;3in1 (PT);Wheelchair (measurements PT);Wheelchair cushion (measurements PT)    Recommendations for Other Services       Precautions / Restrictions Precautions Precautions: Fall Restrictions Weight Bearing Restrictions: Yes LLE Weight Bearing: Non weight bearing LLE Partial Weight Bearing Percentage or Pounds: through heel only    Mobility  Bed Mobility               General bed mobility comments: OOB in chair  Transfers Overall transfer level: Needs assistance Equipment used: Rolling walker (2 wheeled) Transfers: Sit to/from Stand Sit to Stand: Supervision         General transfer comment: good technique  Ambulation/Gait Ambulation/Gait assistance: Min guard Gait Distance (Feet): 5 Feet Assistive device: Rolling walker (2 wheeled) Gait Pattern/deviations: Step-to pattern;Decreased stance time - left;Decreased stride length;Trunk flexed;Antalgic Gait velocity:  decreased   General Gait Details: Ambulated short distance from recliner to steps, min guard for stability.    Stairs Stairs: Yes Stairs assistance: Min guard Stair Management: One rail Right;Sideways Number of Stairs: 4 General stair comments: Negotiated 4 steps sideways, cues provided for WB status, technique, sequencing   Wheelchair Mobility    Modified Rankin (Stroke Patients Only)       Balance Overall balance assessment: Needs assistance Sitting-balance support: Feet supported Sitting balance-Leahy Scale: Normal     Standing balance support: Bilateral upper extremity supported;During functional activity Standing balance-Leahy Scale: Fair                              Cognition Arousal/Alertness: Awake/alert Behavior During Therapy: WFL for tasks assessed/performed Overall Cognitive Status: Within Functional Limits for tasks assessed                                        Exercises      General Comments        Pertinent Vitals/Pain Pain Assessment: Faces Faces Pain Scale: Hurts little more Pain Location: L foot Pain Descriptors / Indicators: Aching;Sore Pain Intervention(s): Limited activity within patient's tolerance;Monitored during session    Home Living                      Prior Function            PT Goals (current goals can now be found in the care plan section) Acute Rehab PT Goals Patient Stated Goal: to heal Potential to Achieve Goals: Good Progress towards PT goals: Progressing toward  goals    Frequency    Min 3X/week      PT Plan Frequency needs to be updated    Co-evaluation              AM-PAC PT "6 Clicks" Mobility   Outcome Measure  Help needed turning from your back to your side while in a flat bed without using bedrails?: None Help needed moving from lying on your back to sitting on the side of a flat bed without using bedrails?: None Help needed moving to and from a bed to  a chair (including a wheelchair)?: A Little Help needed standing up from a chair using your arms (e.g., wheelchair or bedside chair)?: A Little Help needed to walk in hospital room?: A Little Help needed climbing 3-5 steps with a railing? : A Little 6 Click Score: 20    End of Session Equipment Utilized During Treatment: Gait belt Activity Tolerance: Patient tolerated treatment well;Patient limited by pain Patient left: in chair;with call bell/phone within reach;with family/visitor present Nurse Communication: Mobility status PT Visit Diagnosis: Unsteadiness on feet (R26.81);Difficulty in walking, not elsewhere classified (R26.2);Pain Pain - Right/Left: Left Pain - part of body: Ankle and joints of foot     Time: 6237-6283 PT Time Calculation (min) (ACUTE ONLY): 24 min  Charges:  $Gait Training: 8-22 mins $Therapeutic Activity: 8-22 mins                     Wyona Almas, PT, DPT Acute Rehabilitation Services Pager [phone] Office [phone]    Deno Etienne 07/31/2020, 2:42 PM

## 2020-07-31 NOTE — Progress Notes (Signed)
Subjective:   As per RN, patient seen choking on food last night. O2 sats good but slight dip compared to yesterday. Ordered CXR to evaluate for aspiration, came back negative.  Saw patient after he worked with PT. Complaining of some pain in LLE along with some swelling. Reassured him that there can be some swelling as he is not able to mobilize that leg as much as the other one. Reassured him that we do not see any signs of infection.  Patient apprehensive about leaving to go home without having necessary home health needs. Will make sure with social work tomorrow that his home health needs have been met.  Overall, looks good. Will try to discharge home with [facility] and nursing aide tomorrow.  Objective:  Vital signs in last 24 hours: Vitals:   07/30/20 1834 07/31/20 0025 07/31/20 0600 07/31/20 1252  BP: (!) 150/87 111/71 131/70 (!) 149/73  Pulse: 92 73 72 90  Resp: _0 Temp: 98.4 F (36.9 C) 98.5 F (36.9 C) 98.4 F (36.9 C) 98.1 F (36.7 C)  TempSrc: Oral Oral Oral Oral  SpO2: 100% 95% 94% 91%  Weight:      Height:       General: well-nourished male, sitting up in bed, NAD CV: normal rate and regular rhythm, + systolic murmur Pulm: CTABL, no adventitious sound noted Abdomen: soft, nondistended, nontender, +BS Skin: wound vac in place on LLE with minimal drainage. Toes well perfused and warm. Neuro: AAOx3, no focal deficits noted    CBC Latest Ref Rng & Units 07/31/2020 07/30/2020 07/29/2020  WBC 4.0 - 10.5 K/uL 7.6 8.6 9.2  Hemoglobin 13.0 - 17.0 g/dL 10.5(L) 10.4(L) 10.7(L)  Hematocrit 39 - 52 % 34.0(L) 32.7(L) 34.3(L)  Platelets 150 - 400 K/uL 412(H) 393 381    BMP Latest Ref Rng & Units 07/31/2020 07/30/2020 07/29/2020  Glucose 70 - 99 mg/dL 161(H) 220(H) 209(H)  BUN 8 - 23 mg/dL _1 Creatinine 0.61 - 1.24 mg/dL 0.84 0.99 0.98  BUN/Creat Ratio 10 - 24 - - -  Sodium 135 - 145 mmol/L 135 134(L) 133(L)  Potassium 3.5 - 5.1 mmol/L 4.2 4.5 4.9   Chloride 98 - 111 mmol/L 100 101 100  CO2 22 - 32 mmol/L _2 Calcium 8.9 - 10.3 mg/dL 8.7(L) 8.5(L) 8.5(L)     Assessment/Plan:  Principal Problem:   Osteomyelitis of foot (HCC) Active Problems:   Essential hypertension   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  L foot osteomyelitis s/p forth and fifth ray amputation (margins not clear) -surgical cultures showing MRSA -PICC line in place -as per ID, patient will require 6 weeks of IV daptomycin 7847m q24h for MRSA coverage -NWB on LLE (except on heel) -wound vac as per ortho -pain management with tylenol and ibuprofen for mild pain, oxycodone 536mq6h prn for severe pain, IV dilaudid 0.47m647m4h prn for breakthrough pain -PT/OT recommending HH PT/OT with supervision for mobility/OOB -consulted TOC for home health needs -anticipate discharge tomorrow with HH [facility] and nursing aide  T2DM, chronic -  on lantus 12 units daily - on SSI - restart home medications at discharge (Empagoflozin-Metformin 5-1069m BID, Pioglitazone 343mdaily, dulaglutide injection weekly)  HLD, chronic - continue home lipitor 4046maily and ASA 69m42mily  Asthma/COPD, chronic - continue albuterol nebs, dulera nebs - restart home medications at discharge (montelukast 10mg59mvocetirizine 5mg, 72muterol nebs)  Systolic heart murmur - stable -ECHO showing G1DD, mild to moderate mitral stenosis, possible bicuspid aortic valve with mild aortic stenosis.  Prior to Admission Living Arrangement: Home Anticipated Discharge  [facility] Barriers to Discharge: continued medical management Dispo: TBD  Richard Axe0/24/2021, 1:58 PM Pager: [phone] 5pm on weekdays and 1pm on weekends: On Call pager [phone]

## 2020-08-01 LAB — CBC WITH DIFFERENTIAL/PLATELET
Abs Immature Granulocytes: 0.1 10*3/uL — ABNORMAL HIGH (ref 0.00–0.07)
Basophils Absolute: 0.1 10*3/uL (ref 0.0–0.1)
Basophils Relative: 1 %
Eosinophils Absolute: 0.6 10*3/uL — ABNORMAL HIGH (ref 0.0–0.5)
Eosinophils Relative: 9 %
HCT: 33.3 % — ABNORMAL LOW (ref 39.0–52.0)
Hemoglobin: 10.4 g/dL — ABNORMAL LOW (ref 13.0–17.0)
Immature Granulocytes: 2 %
Lymphocytes Relative: 12 %
Lymphs Abs: 0.8 10*3/uL (ref 0.7–4.0)
MCH: 25.6 pg — ABNORMAL LOW (ref 26.0–34.0)
MCHC: 31.2 g/dL (ref 30.0–36.0)
MCV: 82 fL (ref 80.0–100.0)
Monocytes Absolute: 0.7 10*3/uL (ref 0.1–1.0)
Monocytes Relative: 10 %
Neutro Abs: 4.3 10*3/uL (ref 1.7–7.7)
Neutrophils Relative %: 66 %
Platelets: 448 10*3/uL — ABNORMAL HIGH (ref 150–400)
RBC: 4.06 MIL/uL — ABNORMAL LOW (ref 4.22–5.81)
RDW: 13.5 % (ref 11.5–15.5)
WBC: 6.5 10*3/uL (ref 4.0–10.5)
nRBC: 0 % (ref 0.0–0.2)

## 2020-08-01 LAB — BASIC METABOLIC PANEL
Anion gap: 5 (ref 5–15)
BUN: 12 mg/dL (ref 8–23)
CO2: 28 mmol/L (ref 22–32)
Calcium: 8.6 mg/dL — ABNORMAL LOW (ref 8.9–10.3)
Chloride: 102 mmol/L (ref 98–111)
Creatinine, Ser: 0.8 mg/dL (ref 0.61–1.24)
GFR, Estimated: >60 mL/min (ref >60)
Glucose, Bld: 163 mg/dL — ABNORMAL HIGH (ref 70–99)
Potassium: 4.5 mmol/L (ref 3.5–5.1)
Sodium: 135 mmol/L (ref 135–145)

## 2020-08-01 LAB — GLUCOSE, CAPILLARY
Glucose-Capillary: 155 mg/dL — ABNORMAL HIGH (ref 70–99)
Glucose-Capillary: 159 mg/dL — ABNORMAL HIGH (ref 70–99)
Glucose-Capillary: 197 mg/dL — ABNORMAL HIGH (ref 70–99)

## 2020-08-01 MED ORDER — DAPTOMYCIN IV (FOR PTA / DISCHARGE USE ONLY): 750.0000 mg | INTRAVENOUS | 0 refills | Status: AC

## 2020-08-01 MED ORDER — HEPARIN SOD (PORK) LOCK FLUSH 100 UNIT/ML IV SOLN
250.0000 [IU] | INTRAVENOUS | Status: AC | PRN
  Administered 2020-08-01: 250 [IU]
  Filled 2020-08-01: qty 2.5

## 2020-08-01 NOTE — TOC Progression Note (Signed)
Transition of Care [facility]) - Progression Note    Patient Details  Name: Richard [name] MRN: [id number] Date of Birth: Mar 21, 1961  Transition of Care [facility]) CM/SW [place], RN Phone Number: 08/01/2020, 1:13 PM  Clinical Narrative:    CM received call from Dr. Allyson Sabal requesting CM to follow up on a wound vac that's not charging, concerns and about the patient feeling uncomfortable about his IV antibiotic teaching. CM sent a secure chat to the bedside nurse who confirmed that the wound vac has already been switched out. CM called Pam with [facility] who confirms that she will be coming up to see the patient to reiterate teaching. CM has also called [employer] to confirm that DME is to be delivered to the patient. [employer] confirms that DME will be delivered shortly. MD has been updated.   Expected Discharge Plan: [place]    Expected Discharge Plan and Services Expected Discharge Plan: [place] Choice: Home Health, Durable Medical Equipment Living arrangements for the past 2 months: Apartment                 DME Arranged: 3-N-1, Walker rolling, Wheelchair manual DME Agency: [employer] Date DME Agency Contacted: 07/29/20 Time DME Agency Contacted: [phone] Representative spoke with at DME Agency: Freda Munro [place]: PT, RN [facility] Agency: [place] Date [place]: 07/29/20 Time [place]: [id number] Representative spoke with at [place]: [place] Interventions    Readmission Risk Interventions No flowsheet data found.

## 2020-08-01 NOTE — Progress Notes (Signed)
Pt states that negative pressure wound vac is not working. Machine does not appear to be fully charging and requires to be constantly plugged in because it loses its charge quickly. Company to be called when open to resolve issue.

## 2020-08-01 NOTE — Discharge Summary (Addendum)
Name: Richard [name] MRN: [id number] DOB: October 26, 1960 59 y.o. PCP: [name], DO  Date of Admission: 07/25/2020  3:23 PM Date of Discharge: 08/01/2020 Attending Physician: Oda Kilts, MD  Discharge Diagnosis: 1. 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   albuterol (2.5 MG/3ML) 0.083% nebulizer solution Commonly known as: PROVENTIL Take 3 mLs (2.5 mg total) by nebulization every 6 (six) hours as needed for wheezing or shortness of breath.   albuterol 108 (90 Base) MCG/ACT inhaler Commonly known as: ProAir HFA Inhale 2 puffs into the lungs every 4 (four) hours as needed for wheezing or shortness of breath.   aspirin 81 MG EC tablet Take 1 tablet (81 mg total) by mouth daily.   atorvastatin 40 MG tablet Commonly known as: Lipitor Take 1 tablet (40 mg total) by mouth daily.   daptomycin  IVPB Commonly known as: CUBICIN Inject 750 mg into the vein daily. Indication:  osteomyelitis First Dose: No Last Day of Therapy:  09/08/2020 Labs - Once weekly:  CBC/D, BMP, and CPK Labs - Every other week:  ESR and CRP Method of administration: IV Push Method of administration may be changed at the discretion of home infusion pharmacist based upon assessment of the patient and/or caregiver's ability to self-administer the medication ordered.   diclofenac sodium 1 % Gel Commonly known as: VOLTAREN Apply 4 g topically 4 (four)  times daily as needed.   Dulera 200-5 MCG/ACT Aero Generic drug: mometasone-formoterol INHALE 2 PUFFS INTO THE LUNGS TWICE DAILY What changed: when to take this   glucose blood test strip Commonly known as: Accu-Chek Guide Check blood sugar two times a day   ibuprofen 600 MG tablet Commonly known as: ADVIL Take 1 tablet (600 mg total) by mouth every 6 (six) hours as needed.   latanoprost 0.005 % ophthalmic solution Commonly known as: XALATAN Place 1 drop into both eyes at bedtime.   levocetirizine 5 MG tablet Commonly known as: XYZAL Take 1 tablet (5 mg total) by mouth every evening.   methocarbamol 500 MG tablet Commonly known as: ROBAXIN Take 2 tablets (1,000 mg total) by mouth every 8 (eight) hours as needed for muscle spasms.   montelukast 10 MG tablet Commonly known as: SINGULAIR Take 1 tablet (10 mg total) by mouth at bedtime.   MULTIVITAMIN ADULT PO Take 1 tablet by mouth daily.   pioglitazone 30 MG tablet Commonly known as: ACTOS Take 1 tablet (30 mg total) by mouth daily.   pregabalin 100 MG capsule Commonly known as: LYRICA Take 1 capsule (100 mg total) by mouth 3 (three) times daily.   Synjardy 02-999 MG Tabs Generic drug: Empagliflozin-metFORMIN HCl Take 1 tablet by mouth 2 (two) times daily.   timolol 0.5 % ophthalmic solution Commonly known as: TIMOPTIC Place 1 drop into both eyes 2 (two) times daily.   triamcinolone ointment 0.1 % Commonly known as: KENALOG Apply 1 application topically  2 (two) times daily as needed.   Trulicity 1.5 KY/7.0WC Sopn Generic drug: Dulaglutide Inject 1.5 mg into the skin once a week.   Unistik 2 Normal Misc Check blood sugar two times a day   Vyzulta 0.024 % Soln Generic drug: Latanoprostene Bunod Apply 1 drop to eye at bedtime.               Durable Medical Equipment  (From admission, onward)           Start     Ordered   07/29/20 1452  For home use only DME standard manual wheelchair with  seat cushion  Once       Comments: Patient suffers from  Irwin ray LEFT foot which impairs their ability to perform daily activities like ambulating  in the home.  A cane will not resolve issue with performing activities of daily living. A wheelchair will allow patient to safely perform daily activities. Patient can safely propel the wheelchair in the home or has a caregiver who can provide assistance. Length of need lifetime. Accessories: elevating leg rests (ELRs), wheel locks, extensions and anti-tippers.  Seat and back cushions   07/29/20 1451   07/29/20 1450  For home use only DME Walker rolling  Once       Question Answer Comment  Walker: With [place]   Patient needs a walker to treat with the following condition Weakness      07/29/20 1449   07/29/20 1450  For home use only DME 3 n 1  Once        07/29/20 1449              Discharge Care Instructions  (From admission, onward)           Start     Ordered   08/01/20 0000  Change dressing on IV access line weekly and PRN  (Home infusion instructions - [facility] )        08/01/20 1459            Disposition and follow-up:   Mr.Kalani Hawes was discharged from [facility] in [place] condition.  At the hospital follow up visit please address:  1.  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 Surgical cultures showing MRSA. PICC line placed. Per ID, patient will require 6 weeks of IV daptomycin $RemoveBefor'750mg'jgoAFrTRVlUU$  q24h for MRSA coverage. Currently weight-bearing as tolerated. Wound vac in place as per orthopedics. Discharged home with [facility] PT and RN. F/u with orthopedics in 1 week for wound vac removal. F/u with ID on 08/23/20 to manage IV abx.  2.  Labs / imaging needed at time of follow-up: CBC, BMP  3.  Pending labs/ test needing follow-up: NA  Follow-up Appointments:  Follow-up Information     Suzan Slick, NP Follow up in 1 week(s).    Specialty: Orthopedic Surgery Why: Schedule an appointment with [facility] in 1 week to check your wound vac. Contact information: [place] [address] [phone]         [facility] Follow up on 08/23/2020.   Specialty: Infectious Diseases Why: at 09:45 An appointment is made for you. This office is managing your IV antibiotics.  Contact information: [place], [employer] [id number] [facility] [place] [facility] Course  by problem list: 1. Left foot osteomyelitis s/p 4th and 5th ray amputation. Patient with history of T2DM w/neuropathy who was recently admitted for non-purulent cellulitis of his left foot. Was discharged a few days PTA with improvement on vancomycin, ceftriaxone, and metronidazole and was transitioned to augmentin and doxycycline as outpatient. Found to have purulent drainage a couple of days after discharge while changing the dressing on his foot and returned to clinic for evaluation where purulent drainage was noted. Sent to ED from clinic for hospital admission. L foot xray showing possible erosion along medial aspect of 4th toe proximal phalanx adjacent to PIP joint, concerning for osteo. MRI showing large abscess with possible osteomyelitis. Orthopedics consulted, performed ray amputation of 4th and 5th digits with evidence of some involvement of 3rd metatarsal intraop, and wound vac placed. Restarted on broad spectrum coverage with vancomycin and cefepime. Surgical cultures showed MRSA. PICC line placed, and patient started on IV daptomycin as per ID for MRSA coverage. Patient is weight-bearing as tolerated. Discharged home with [facility] PT and RN. Patient to follow up with orthopedics in 1 week for wound vac removal. He is to follow up with ID on 08/23/20 for management of IV daptomycin.  T2DM, chronic. Stable during admission on lantus 10 units  daily and SSI. Restarted home medications at discharge (Empagoflozin-Metformin 5-1000mg  BID, Pioglitazone 30mg  daily, dulaglutide injection weekly). A1C 7.2%  HLD, chronic. Continued his home lipitor 40mg  and ASA 81mg  daily after surgery.  Asthma/COPD, chronic. Stable on albuterol nebs and dulera nebs. Restarted home medications at discharge (montelukast 10mg , levocetirizine 5mg , albuterol nebs).  Systolic heart murmur. ECHO showing G1DD, mild to moderate mitral stenosis, possible bicuspid aortic valve with mild aortic stenosis.  Discharge Vitals:   BP 121/71 (BP Location: Left Arm)   Pulse 79   Temp 98.1 F (36.7 C) (Oral)   Resp 17   Ht 6\' 2"  (1.88 m)   Wt 102.7 kg   SpO2 98%   BMI 29.07 kg/m   Pertinent Labs, Studies, and Procedures:  CBC Latest Ref Rng & Units 08/01/2020 07/31/2020 07/30/2020  WBC 4.0 - 10.5 K/uL 6.5 7.6 8.6  Hemoglobin 13.0 - 17.0 g/dL 10.4(L) 10.5(L) 10.4(L)  Hematocrit 39 - 52 % 33.3(L) 34.0(L) 32.7(L)  Platelets 150 - 400 K/uL 448(H) 412(H) 393   CMP Latest Ref Rng & Units 08/01/2020 07/31/2020 07/30/2020  Glucose 70 - 99 mg/dL 163(H) 161(H) 220(H)  BUN 8 - 23 mg/dL 12 13 14   Creatinine 0.61 - 1.24 mg/dL 0.80 0.84 0.99  Sodium 135 - 145 mmol/L 135 135 134(L)  Potassium 3.5 - 5.1 mmol/L 4.5 4.2 4.5  Chloride 98 - 111 mmol/L 102 100 101  CO2 22 - 32 mmol/L 28 26 26   Calcium 8.9 - 10.3 mg/dL 8.6(L) 8.7(L) 8.5(L)  Total Protein 6.5 - 8.1 g/dL - - -  Total Bilirubin 0.3 - 1.2 mg/dL - - -  Alkaline Phos 38 - 126 U/L - - -  AST 15 - 41 U/L - - -  ALT 0 - 44 U/L - - -   Erythrocyte Sedimentation Rate     Component Value Date/Time   ESRSEDRATE 95 (H) 07/26/2020 0439   C-Reactive Protein     Component Value Date/Time   CRP 18.5 (H) 07/26/2020 0439   Lactic Acid, Venous    Component Value Date/Time   LATICACIDVEN 1.0 07/25/2020 1538   Blood Culture    Component Value Date/Time   SDES TISSUE LEFT FOOT 07/27/2020 1749   SPECREQUEST NONE  07/27/2020 1749   CULT  07/27/2020 1749    RARE METHICILLIN RESISTANT STAPHYLOCOCCUS AUREUS CRITICAL RESULT CALLED TO, READ BACK BY AND VERIFIED WITH: RN M. BERNARDO [id number] FCP NO ANAEROBES ISOLATED DAPTOMYCIN MIC PER MD Sent to [facility] for further susceptibility testing. Performed at [facility], [place] [address], [place], [place] [address]    REPTSTATUS PENDING 07/27/2020 1749   MR FOOT LEFT W WO CONTRAST  Result Date: 07/26/2020 CLINICAL DATA:  Diabetic left foot wound.  Abnormal x-ray. EXAM: MRI OF THE LEFT FOREFOOT WITHOUT AND WITH CONTRAST TECHNIQUE: Multiplanar, multisequence MR imaging of the left forefoot was performed both before and after administration of intravenous contrast. CONTRAST:  34mL GADAVIST GADOBUTROL 1 MMOL/ML IV SOLN COMPARISON:  X-ray 07/25/2020, MRI 07/20/2020 FINDINGS: Bones/Joint/Cartilage Interval development bone marrow edema and enhancement involving the base of the fourth toe proximal phalanx (series 8, image 11). The T1 bone marrow signal within this location appears preserved. More subtle areas of faint marrow edema and enhancement within the proximal phalanx of the second, third, and fifth toes (series 13, images 6, 13, and 17). There is no definite area of bony destruction or marrow replacement. Mild arthropathy of the first MTP joint. Ligaments Intact Lisfranc ligament. Collateral ligaments of the forefoot are intact. Muscles and Tendons Intact flexor and extensor tendons. No tenosynovial fluid collection. Redemonstration of reviewed from muscle signal changes suggesting myositis and denervation. Soft tissues Focal soft tissue ulceration at the plantar aspect of the forefoot underlying the fourth toe. Fluid collection extends from the ulcer base into the fourth interweb space and extends across the plantar aspect of the foot underlying the proximal aspect of the second through fifth toes (series 5, images 35-41; series 6, images 11-12). Approximate  measurements of the collection are 6.7 x 1.6 x 2.0 cm. Collection is slightly ill-defined without complete rim enhancement. Diffuse subcutaneous edema throughout the forefoot. IMPRESSION: 1. Focal soft tissue ulceration at the plantar aspect of the forefoot underlying the fourth toe. Interval development of a slightly ill-defined fluid collection which extends from the ulcer base into the fourth interweb space and extends across the plantar aspect of the foot underlying the proximal aspects of the second through fifth toes. Collection measures approximately 6.7 x 1.6 x 2.0 cm and is compatible with a phlegmon/developing abscess. 2. Interval development of bone marrow edema and enhancement involving the base of the fourth toe proximal phalanx. Although the T1 marrow signal at this location appears preserved, findings are suspicious for early acute osteomyelitis. 3. More subtle areas of faint marrow edema and enhancement within the proximal phalanxes of the second, third, and fifth toes. Differential includes reactive osteitis versus early acute osteomyelitis. Electronically Signed   By: Davina Poke D.O.   On: 07/26/2020 14:15   DG Foot Complete Left  Result Date: 07/25/2020 CLINICAL DATA:  Diabetic foot sore at the plantar-lateral foot EXAM: LEFT FOOT - COMPLETE 3+ VIEW COMPARISON:  07/19/2020 FINDINGS: Possible erosion along the medial aspect of the fourth toe proximal phalanx adjacent to the PIP joint. Osseous structures appear otherwise intact. No fracture. No dislocation. Soft tissue swelling and irregularity at the plantar foot, not well evaluated. No soft tissue gas. Prominent small vessel arterial calcification. IMPRESSION: Possible erosion along the medial aspect of the fourth toe proximal phalanx adjacent to the PIP joint. Findings are concerning for osteomyelitis given the history. Further evaluation could be performed with MRI, as clinically indicated. Electronically Signed   By: Davina Poke  D.O.  On: 07/25/2020 16:32   VAS [place] LOWER EXTREMITY VENOUS (DVT)  Result Date: 07/27/2020  Lower Venous DVTStudy Indications: Pain, Swelling, and Edema.  Risk Factors: None identified. Performing Technologist: Griffin Basil RCT RDMS  Examination Guidelines: A complete evaluation includes B-mode imaging, spectral Doppler, color Doppler, and power Doppler as needed of all accessible portions of each vessel. Bilateral testing is considered an integral part of a complete examination. Limited examinations for reoccurring indications may be performed as noted. The reflux portion of the exam is performed with the patient in reverse Trendelenburg.  +---------+---------------+---------+-----------+----------+--------------+ LEFT     CompressibilityPhasicitySpontaneityPropertiesThrombus Aging +---------+---------------+---------+-----------+----------+--------------+ CFV      Full           Yes      Yes                                 +---------+---------------+---------+-----------+----------+--------------+ SFJ      Full                                                        +---------+---------------+---------+-----------+----------+--------------+ FV Prox  Full                                                        +---------+---------------+---------+-----------+----------+--------------+ FV Mid   Full                                                        +---------+---------------+---------+-----------+----------+--------------+ FV DistalFull                                                        +---------+---------------+---------+-----------+----------+--------------+ PFV      Full                                                        +---------+---------------+---------+-----------+----------+--------------+ POP      Full           Yes      Yes                                 +---------+---------------+---------+-----------+----------+--------------+  PTV      Full                                                        +---------+---------------+---------+-----------+----------+--------------+ PERO     Full                                                        +---------+---------------+---------+-----------+----------+--------------+  Summary: RIGHT: - No evidence of deep vein thrombosis in the lower extremity. No indirect evidence of obstruction proximal to the inguinal ligament.  LEFT: - There is no evidence of deep vein thrombosis in the lower extremity.  - No cystic structure found in the popliteal fossa.  *See table(s) above for measurements and observations. Electronically signed by Harold Barban MD on 07/27/2020 at 7:41:07 PM.    Final    Discharge Instructions: Discharge Instructions     [facility] pharmacist to adjust dose for Vancomycin, Aminoglycosides and other anti-infective therapies as requested by physician.   Complete by: As directed    [facility] to provide Cath Flo 2mg    Complete by: As directed    Administer for PICC line occlusion and as ordered by physician for other access device issues.   Anaphylaxis Kit: Provided to treat any anaphylactic reaction to the medication being provided to the patient if First Dose or when requested by physician   Complete by: As directed    Epinephrine 1mg /ml vial / amp: Administer 0.3mg  (0.42ml) subcutaneously once for moderate to severe anaphylaxis, nurse to call physician and pharmacy when reaction occurs and call 911 if needed for immediate care   Diphenhydramine 50mg /ml IV vial: Administer 25-50mg  IV/IM PRN for first dose reaction, rash, itching, mild reaction, nurse to call physician and pharmacy when reaction occurs   Sodium Chloride 0.9% NS 525ml IV: Administer if needed for hypovolemic blood pressure drop or as ordered by physician after call to physician with anaphylactic reaction   Call MD for:  difficulty breathing, headache or visual  disturbances   Complete by: As directed    Call MD for:  extreme fatigue   Complete by: As directed    Call MD for:  hives   Complete by: As directed    Call MD for:  persistant dizziness or light-headedness   Complete by: As directed    Call MD for:  persistant nausea and vomiting   Complete by: As directed    Call MD for:  redness, tenderness, or signs of infection (pain, swelling, redness, odor or green/yellow discharge around incision site)   Complete by: As directed    Call MD for:  severe uncontrolled pain   Complete by: As directed    Call MD for:  temperature >100.4   Complete by: As directed    Change dressing on IV access line weekly and PRN   Complete by: As directed    Diet - low sodium heart healthy   Complete by: As directed    Discharge instructions   Complete by: As directed    Hi Mr. Tinoco, it was a pleasure taking care of you during your time here. You came in with a severe infection and we had to amputate your 4th and 5th digits on your left foot.  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   All of the home health needs that you requested have been provided and you will be going home with home health physical therapy and nursing to help your transition back home easier.  Please schedule a visit with [facility] [employer] [facility] in 1 week so that they can check your wound vac.  Also, you have been scheduled to see [facility] for Infectious Disease on 08/23/20 at 9:45AM to make sure you are doing well  on your antibiotics.  Lastly, please schedule an appointment with the [facility] in 2-3 weeks for your hospital follow up.   Flush IV access with Sodium Chloride 0.9% and Heparin 10 units/ml or 100 units/ml   Complete by: As directed    Home infusion instructions - [facility]   Complete by: As directed    Instructions: Flush  IV access with Sodium Chloride 0.9% and Heparin 10units/ml or 100units/ml   Change dressing on IV access line: Weekly and PRN   Instructions Cath Flo $Remove'2mg'loTWHKw$ : Administer for PICC Line occlusion and as ordered by physician for other access device   [facility] pharmacist to adjust dose for: Vancomycin, Aminoglycosides and other anti-infective therapies as requested by physician   Increase activity slowly   Complete by: As directed    Method of administration may be changed at the discretion of home infusion pharmacist based upon assessment of the patient and/or caregiver's ability to self-administer the medication ordered   Complete by: As directed    Negative Pressure Wound Therapy - Incisional   Complete by: As directed    Show patient how to attach and charge prevena vac. Should call office if vac alarms or stops working   No wound care   Complete by: As directed        Signed: Virl Axe, MD 08/01/2020, 2:59 PM   Pager: [phone]

## 2020-08-01 NOTE — Progress Notes (Signed)
Subjective:  No acute overnight events  He is feeling very good today. In good spirits about everything that has happened.   He feels like he is medically ready to go home but just wants to make sure everything is in place before he goes home. 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 Also needs for his wound vac to be replaced as it is not charging.   Objective:  Vital signs in last 24 hours: Vitals:   07/31/20 1829 07/31/20 2008 07/31/20 2357 08/01/20 0642  BP: 138/77  135/80 (!) 143/81  Pulse: 77 79 72 81  Resp: 16 16 16 17   Temp: 98.8 F (37.1 C)  98.4 F (36.9 C) 98.4 F (36.9 C)  TempSrc: Oral  Oral Oral  SpO2: 97% 97% 98% 98%  Weight:      Height:       General: well-nourished male, sitting up in bed, NAD CV: normal rate and regular rhythm, + systolic murmur Pulm: CTABL, no adventitious sounds noted Abdomen: soft, nondistended, nontender, +BS Skin: wound vac in place on LLE with minimal drainage. Neuro: AAOx3   CBC Latest Ref Rng & Units 08/01/2020 07/31/2020 07/30/2020  WBC 4.0 - 10.5 K/uL 6.5 7.6 8.6  Hemoglobin 13.0 - 17.0 g/dL 10.4(L) 10.5(L) 10.4(L)  Hematocrit 39 - 52 % 33.3(L) 34.0(L) 32.7(L)  Platelets 150 - 400 K/uL 448(H) 412(H) 393    BMP Latest Ref Rng & Units 08/01/2020 07/31/2020 07/30/2020  Glucose 70 - 99 mg/dL 163(H) 161(H) 220(H)  BUN 8 - 23 mg/dL 12 13 14   Creatinine 0.61 - 1.24 mg/dL 0.80 0.84 0.99  BUN/Creat Ratio 10 - 24 - - -  Sodium 135 - 145 mmol/L 135 135 134(L)  Potassium 3.5 - 5.1 mmol/L 4.5 4.2 4.5  Chloride 98 - 111 mmol/L 102 100 101  CO2 22 - 32 mmol/L 28 26 26   Calcium 8.9 - 10.3 mg/dL 8.6(L) 8.7(L) 8.5(L)     Assessment/Plan:  Principal Problem:   Osteomyelitis of foot (HCC) Active Problems:   Essential hypertension   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 Brockman is a 59 year old male with PMH of T2DM with neuropathy, HTN, HLD, Asthma/COPD who presented to [facility] on 10[facility]18 for readmission for his left leg nonpurulent cellulitis and possible new L foot osteomyelitis d/t likely outpatient antibiotic failure.  L foot osteomyelitis s/p forth and fifth ray amputation (margins not clear) -surgical cultures showing MRSA -PICC line in place -as per ID, patient will require 6 weeks of IV daptomycin 750mg  q24h for MRSA coverage -NWB on LLE (except on heel) -wound vac as per ortho -pain management with tylenol and ibuprofen for mild pain -PT/OT recommending HH PT/OT with supervision for mobility/OOB -consulted TOC for home health needs -anticipate discharge tomorrow with [facility] PT and nursing aide   T2DM, chronic - on lantus 12 units daily - on SSI - restart home medications at discharge (Empagoflozin-Metformin 5-1000mg  BID, Pioglitazone 30mg  daily, dulaglutide injection weekly)  HLD, chronic - continue home lipitor 40mg  daily and ASA 81mg  daily  Asthma/COPD, chronic - continue albuterol nebs, dulera nebs - restart home medications at discharge (montelukast 10mg , levocetirizine 5mg , albuterol nebs)  Systolic heart murmur - stable - ECHO showing G1DD, mild to moderate mitral stenosis, possible bicuspid aortic valve with  mild aortic stenosis.  Prior to Admission Living Arrangement: Home Anticipated Discharge Location: Home with [facility] PT and nursing Barriers to Discharge: continued medical management Dispo: TBD  Virl Axe, MD 08/01/2020, 7:47 AM Pager: [phone] After 5pm on weekdays and 1pm on weekends: On Call pager [phone]

## 2020-08-01 NOTE — Progress Notes (Signed)
Occupational Therapy Treatment Patient Details Name: Richard [name] MRN: [id number] DOB: 18-Jan-1961 Today's Date: 08/01/2020    History of present illness 59yo male with recent admit for L foot cellulitis, now returning with increased pain and edema in L foot despite being compliant with antibiotic/medication regimen. 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 PMH COPD, HTN, HLD, neuropathy, substance abuse, DM, cardiac cath   OT comments  Pt making steady progress towards OT goals this session. Pt continues to present with impaired balance, decreased activity tolerance and pain in LLE during mobility impacting pts ability to complete BADLs. Overall, pt able to complete household distance functional mobility with RW and min guard assist with good adherence to WB restrictions with RW. Pt motivated to return home with intermittent assist from granddaughter who lives with him. Pt less anxious this session and noted improved safety awareness this session. Pt would continue to benefit from skilled occupational therapy while admitted and after d/c to address the below listed limitations in order to improve overall functional mobility and facilitate independence with BADL participation. DC plan remains appropriate, will follow acutely per POC.     Follow Up Recommendations  Home health OT;Supervision - Intermittent    Equipment Recommendations  3 in 1 bedside commode    Recommendations for Other Services      Precautions / Restrictions Precautions Precautions: Fall Precaution Comments: wound vac Restrictions Weight Bearing Restrictions: Yes LLE Weight Bearing: Partial weight bearing LLE Partial Weight Bearing Percentage or Pounds: WB thru heel only       Mobility Bed Mobility Overal bed mobility: Modified Independent Bed Mobility: Supine to Sit     Supine to sit: Modified independent (Device/Increase time)     General bed mobility comments: use of bed rails; no physical  assist needed  Transfers Overall transfer level: Needs assistance Equipment used: Rolling walker (2 wheeled) Transfers: Sit to/from Stand Sit to Stand: Min guard;Supervision         General transfer comment: minguard for safety from EOB; supervision from 3n1 over regular height toilet, and from chair in front of sink. good hand placement and technique noted. pt reports plan to sleep in recliner at home as bed is upstairs and prefers to sit<>stand from recliner at home    Balance Overall balance assessment: Needs assistance Sitting-balance support: Feet supported Sitting balance-Leahy Scale: Normal     Standing balance support: Bilateral upper extremity supported;During functional activity Standing balance-Leahy Scale: Poor Standing balance comment: reliant on BUE support                           ADL either performed or assessed with clinical judgement   ADL Overall ADL's : Needs assistance/impaired     Grooming: Wash/dry face;Oral care;Sitting;Modified independent Grooming Details (indicate cue type and reason): sitting on chair in front of sink Upper Body Bathing: Modified independent;Sitting Upper Body Bathing Details (indicate cue type and reason): sitting in front of sink, no physical assist needed Lower Body Bathing: Sitting/lateral leans;Modified independent Lower Body Bathing Details (indicate cue type and reason): sitting in front of sink Upper Body Dressing : Minimal assistance;Sitting Upper Body Dressing Details (indicate cue type and reason): MIN A to tie back of gown Lower Body Dressing: Supervision/safety;Set up;Sitting/lateral leans Lower Body Dressing Details (indicate cue type and reason): to don shoe on R foot from EOB Toilet Transfer: Min guard;Ambulation;RW;BSC (BSc over regular height toilet) Toilet Transfer Details (indicate cue type and reason): minguard  for safety while ambulating to BR with BSC over regular height toilet. noted use of grab  bars with good safety awareness noted       Tub/Shower Transfer Details (indicate cue type and reason): issued pt printout for tub shower transfer to 3n1 with pt able to verbalize demo, however pt agreeable to complete sponge baths at sink level Functional mobility during ADLs: Min guard;Rolling walker General ADL Comments: good carryover of education related to wound vac and overall safety awareness during session. very motivated to return to [facility]     Praxis      Cognition Arousal/Alertness: Awake/alert Behavior During Therapy: WFL for tasks assessed/performed Overall Cognitive Status: Within Functional Limits for tasks assessed                                          Exercises     Shoulder Instructions       General Comments dressing on L foot appears clean and dry, education and handout provided on technique for tub tranfser to 3n1. pt stated granddaughter lives with him but works 1 am- 9 am. pt very motivated about returning home and being as independent as possible.    Pertinent Vitals/ Pain       Pain Assessment: Faces Faces Pain Scale: Hurts a little bit Pain Location: L foot Pain Descriptors / Indicators: Aching;Sore Pain Intervention(s): Monitored during session;Repositioned  Home Living                                          Prior Functioning/Environment              Frequency  Min 2X/week        Progress Toward Goals  OT Goals(current goals can now be found in the care plan section)  Progress towards OT goals: Progressing toward goals  Acute Rehab OT Goals Patient Stated Goal: to heal OT Goal Formulation: With patient Time For Goal Achievement: 08/12/20 Potential to Achieve Goals: Good  Plan Discharge plan remains appropriate;Frequency remains appropriate    Co-evaluation                 AM-PAC OT "6 Clicks" Daily Activity     Outcome Measure   Help from another  person eating meals?: None Help from another person taking care of personal grooming?: A Little Help from another person toileting, which includes using toliet, bedpan, or urinal?: A Little Help from another person bathing (including washing, rinsing, drying)?: A Little Help from another person to put on and taking off regular upper body clothing?: A Little Help from another person to put on and taking off regular lower body clothing?: A Little 6 Click Score: 19    End of Session Equipment Utilized During Treatment: Rolling walker;Other (comment) (wound vac)  OT Visit Diagnosis: Other abnormalities of gait and mobility (R26.89) Pain - Right/Left: Left Pain - part of body: Ankle and joints of foot   Activity Tolerance Patient tolerated treatment well   Patient Left in chair;with call bell/phone within reach   Nurse Communication Mobility status        Time: 1093-2355 OT Time Calculation (min): 26 min  Charges: OT General Charges $OT Visit: 1 Visit OT Treatments $Self  Care/Home Management : 23-37 mins  Lanier Clam., COTA/L Acute Rehabilitation Services [phone] [place] 08/01/2020, 9:06 AM

## 2020-08-01 NOTE — Progress Notes (Signed)
RN gave pt discharge instructions and the patient stated understanding. PICC line has been flushed and capped for home DC by IV team. Wheelchair and 3N1 has been delivered to the room for the patient. PRVENA [facility]AC attached and the patient knows about it.

## 2020-08-02 ENCOUNTER — Encounter (HOSPITAL_COMMUNITY): Payer: Self-pay | Admitting: Orthopedic Surgery

## 2020-08-02 ENCOUNTER — Telehealth: Payer: Self-pay

## 2020-08-02 LAB — MISC LABCORP TEST (SEND OUT)
LabCorp test name: 1
Labcorp test code: 96388

## 2020-08-02 NOTE — Telephone Encounter (Signed)
Pt is calling regarding his medicine, he got new medicine [phone]

## 2020-08-02 NOTE — Telephone Encounter (Signed)
Return pt's call - stated he was discharged from the hospital yesterday and instructed to start taking Synjarday and to stop taking a medication. But he does not know what meds he needs to stop. Please advise.  Thanks

## 2020-08-02 NOTE — Anesthesia Postprocedure Evaluation (Signed)
Anesthesia Post Note  Patient: Richard [name]  Procedure(s) Performed: AMPUTATION 4TH AND 5TH TOE LEFT (Left Foot)     Patient location during evaluation: PACU Anesthesia Type: Regional and MAC Level of consciousness: awake and alert Pain management: pain level controlled Vital Signs Assessment: post-procedure vital signs reviewed and stable Respiratory status: spontaneous breathing, nonlabored ventilation, respiratory function stable and patient connected to nasal cannula oxygen Cardiovascular status: stable and blood pressure returned to baseline Postop Assessment: no apparent nausea or vomiting Anesthetic complications: no   No complications documented.  Last Vitals:  Vitals:   08/01/20 0642 08/01/20 1234  BP: (!) 143/81 121/71  Pulse: 81 79  Resp: 17 17  Temp: 36.9 C 36.7 C  SpO2: 98% 98%    Last Pain:  Vitals:   08/01/20 1234  TempSrc: Oral  PainSc:                  Natalee Tomkiewicz

## 2020-08-03 ENCOUNTER — Telehealth: Payer: Self-pay | Admitting: Family

## 2020-08-03 ENCOUNTER — Telehealth: Payer: Self-pay | Admitting: Internal Medicine

## 2020-08-03 ENCOUNTER — Telehealth: Payer: Self-pay | Admitting: *Deleted

## 2020-08-03 ENCOUNTER — Encounter: Payer: Self-pay | Admitting: Family

## 2020-08-03 ENCOUNTER — Ambulatory Visit (INDEPENDENT_AMBULATORY_CARE_PROVIDER_SITE_OTHER): Payer: Medicare Other | Admitting: Family

## 2020-08-03 VITALS — Ht 74.0 in | Wt 226.0 lb

## 2020-08-03 DIAGNOSIS — Z89422 Acquired absence of other left toe(s): Secondary | ICD-10-CM | POA: Insufficient documentation

## 2020-08-03 DIAGNOSIS — Z89432 Acquired absence of left foot: Secondary | ICD-10-CM | POA: Insufficient documentation

## 2020-08-03 HISTORY — DX: Acquired absence of other left toe(s): Z89.422

## 2020-08-03 NOTE — Telephone Encounter (Signed)
[place] RN calling to clarify diabetic Medications. RN has some concerns and would like to know what he should be taking since his D/C

## 2020-08-03 NOTE — Telephone Encounter (Addendum)
Returned call to [place], Therapist, sports with [employer]. Reviewed AVS from hospital d/c on 08/01/2020. Patient should be taking:   Actos 30 mg daily Synjardy 11-3341 mg BID Trulicity 1.5 mg weekly  Patient currently taking Actos, and Trulicity as directed but taking metformin instead of Synjardy. Patient told Langley Gauss he will not start Synjardy till he hears back from Dr. Heber Malone.  Patient does not have Atorvastatin at house. Patient has refills available at [facility]  Patient BP yesterday 148/78. He is not prescribed and antihypertensive at this time. Please advise if he should be.  Patient is taking Vit C 500 mg daily. This has been added to med list.
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
## 2020-08-03 NOTE — Telephone Encounter (Signed)
I called to let pt know that I spoke Zach at [employer] and he states that wheelchair was delivered in the hospital and that the rolling walker was on back order but he had shipped from another location and should ship with in 3-5 days. Pt will call with any other questions.

## 2020-08-03 NOTE — Telephone Encounter (Signed)
Called and sw [employer] to advise tht the pt will not start PT as he is non weight bearing at the moment. Sw HHn today and advised of the dry dressing changes that hse will teach family to do. Also advised that a wheelchair and a walker were ordered for the pt at the time of d/c and that I will reach out to [employer] to check on the delivery status of that. I will contact the pt once this information has been obtained

## 2020-08-03 NOTE — Telephone Encounter (Signed)
Received call from [place] with [facility] needing wound care orders. The number to contact Langley Gauss is [phone]

## 2020-08-03 NOTE — Telephone Encounter (Signed)
I called and sw HHN Denise and advised that the pt was evaluated in the office today and was sent home with dressing supplies. He is a dry dressing change daily which insurance does not cover. The pt has teachable caregiver at home and [facility] will work with them to instruct on dressing cahnges and call with questions. Pt has a follow up in the office on 08/17/20 advised we can move appt up if needed.

## 2020-08-03 NOTE — Telephone Encounter (Signed)
[employer] rep called, Colletta [place]. She says patient has not heard from home PT agency. Also he does not have a walker. She would like someone to call her. [phone] ext [phone]

## 2020-08-04 LAB — MINIMUM INHIBITORY CONC. (1 DRUG)

## 2020-08-04 LAB — MIC RESULT

## 2020-08-04 NOTE — Telephone Encounter (Signed)
Pt called / informed to continue Synjardy (combo pill) and stop Metformin and Jardiance. Pt repeated instruction.

## 2020-08-04 NOTE — Telephone Encounter (Signed)
Iva Boop will replace Metformin and Jardiance (it has both medications combined)

## 2020-08-08 ENCOUNTER — Encounter: Payer: Self-pay | Admitting: Infectious Disease

## 2020-08-10 ENCOUNTER — Other Ambulatory Visit: Payer: Self-pay

## 2020-08-10 ENCOUNTER — Ambulatory Visit (INDEPENDENT_AMBULATORY_CARE_PROVIDER_SITE_OTHER): Payer: Medicare Other | Admitting: Student

## 2020-08-10 ENCOUNTER — Encounter: Payer: Self-pay | Admitting: Student

## 2020-08-10 DIAGNOSIS — E1140 Type 2 diabetes mellitus with diabetic neuropathy, unspecified: Secondary | ICD-10-CM

## 2020-08-10 DIAGNOSIS — I10 Essential (primary) hypertension: Secondary | ICD-10-CM

## 2020-08-10 DIAGNOSIS — M86272 Subacute osteomyelitis, left ankle and foot: Secondary | ICD-10-CM

## 2020-08-10 DIAGNOSIS — E1142 Type 2 diabetes mellitus with diabetic polyneuropathy: Secondary | ICD-10-CM

## 2020-08-10 DIAGNOSIS — E1139 Type 2 diabetes mellitus with other diabetic ophthalmic complication: Secondary | ICD-10-CM

## 2020-08-10 DIAGNOSIS — IMO0002 Reserved for concepts with insufficient information to code with codable children: Secondary | ICD-10-CM

## 2020-08-10 MED ORDER — ACCU-CHEK GUIDE W/DEVICE KIT: 1.0000 | PACK | Freq: Two times a day (BID) | 1 refills | Status: DC

## 2020-08-10 MED ORDER — ACCU-CHEK GUIDE VI STRP
ORAL_STRIP | 5 refills | Status: DC
Start: 2020-08-10 — End: 2021-08-16

## 2020-08-10 MED ORDER — TRULICITY 1.5 MG/0.5ML ~~LOC~~ SOAJ
1.5000 mg | SUBCUTANEOUS | 1 refills | Status: DC
Start: 2020-08-10 — End: 2021-08-16

## 2020-08-10 NOTE — Progress Notes (Signed)
   CC: hospital f/u visit  HPI:  Mr.Candice Schueller is a 59 y.o. M with history of T2DM with neuropathy, PAD, and left foot osteomyelitis s/p ray amputation of 4th and 5th digits presenting for hospital f/u. Please see individualized A&P for full HPI.  Past Medical History:  Diagnosis Date  . Asthma   . Chronic bronchitis ([place])   . Clostridium difficile colitis 09/09/2014  . Constipation 09/26/2018  . COPD (chronic obstructive pulmonary disease) ([place])   . Eczema   . Essential hypertension   . 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 Hyperlipidemia   . Need for Tdap vaccination 09/19/2018  . Neuromuscular disorder (H[facility])    neuropathy in feet  . 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 Substance abuse ([place])    crack - recovered x 30 yrs  . Type II diabetes mellitus ([place])    diagnosed in 2013   Review of Systems:   Negative ROS aside from that listed in individualized A&P  Physical Exam:  Vitals:   08/10/20 0846  BP: 123/71  Pulse: 92  Temp: 98.2 F (36.8 C)  TempSrc: Oral  SpO2: 100%  Weight: 231 lb 6.4 oz (105 kg)  Height: 6\' 3"  (1.905 m)   Physical Exam Constitutional:      Appearance: He is obese. He is not ill-appearing.  HENT:     Head: Normocephalic and atraumatic.     Mouth/Throat:     Mouth: Mucous membranes are moist.     Pharynx: Oropharynx is clear. No oropharyngeal exudate.  Eyes:     General: No scleral icterus.    Extraocular Movements: Extraocular movements intact.     Conjunctiva/sclera: Conjunctivae normal.     Pupils: Pupils are equal, round, and reactive to light.  Cardiovascular:     Rate and Rhythm: Normal rate and regular rhythm.     Pulses: Normal pulses.     Heart sounds: Normal heart sounds. No murmur heard.  No friction rub. No gallop.   Pulmonary:     Effort: Pulmonary effort is normal.     Breath sounds: Normal breath sounds. No wheezing, rhonchi or rales.  Abdominal:     General: Bowel sounds are normal. There is no  distension.     Palpations: Abdomen is soft.     Tenderness: There is no abdominal tenderness. There is no guarding or rebound.  Musculoskeletal:        General: No tenderness. Normal range of motion.     Cervical back: Normal range of motion.     Right lower leg: No edema.     Comments: Trace left lower extremity edema up to ankle. Left foot wrapped in ACE bandage. No bloody drainage noted.  Skin:    General: Skin is warm and dry.     Capillary Refill: Capillary refill takes less than 2 seconds.  Neurological:     General: No focal deficit present.     Mental Status: He is alert and oriented to person, place, and time.  Psychiatric:        Mood and Affect: Mood normal.        Behavior: Behavior normal.        Thought Content: Thought content normal.        Judgment: Judgment normal.      Assessment & Plan:   See Encounters Tab for problem based charting.  Patient seen with Dr. Heber Robinson

## 2020-08-10 NOTE — Assessment & Plan Note (Signed)
Reports doing well on lyrica. -continue lyrica 100mg  TID

## 2020-08-10 NOTE — Assessment & Plan Note (Signed)
Last HbA1c 7.2 (on 07/20/20). Reports that his eating habits have improved significantly as his daughter gets his groceries now. Reports that he saw his ophthalmologist earlier this year, but he is due to for another visit soon. I do not see any notes from his ophthalmologist and patient reports that it is a new doctor but he will try to have notes sent over. He reports being strictly compliant with his synjardy, actos, and trulicity. Needs refill for trulicity and a new meter.  -f/u ophthalmology reports -continue synjardy 5-1000mg  BID -continue Actos 30mg  daily -refilled trulicity 1.5mg  weekly -placed order for a new meter for home glucose monitoring

## 2020-08-10 NOTE — Assessment & Plan Note (Signed)
He reports doing much better today. He saw Orthopedic Surgery last week for removal of wound vac, was still having moderate bloody drainage at surgical site. Reports that he is now having minimal bloody drainage. Scheduled to see Ortho next week. He is on IV daptomycin 750mg  q24h via PICC line for total 6 week course. Scheduled to see ID in 2 weeks for management of abx. Working with [facility] PT and Therapist, sports. On exam, left foot looks a lot better now, but still having some trace swelling up to ankle. -Orthopedic surgery in 1 week -ID in 2 weeks -continue IV daptomycin for 6 week course -continue working with [facility] PT and RN

## 2020-08-10 NOTE — Patient Instructions (Signed)
Richard [name],  It was a pleasure seeing you in the clinic today. I am glad to see that you are doing well!  As we discussed, I sent refills for your trulicity and a new meter to your pharmacy.  You have your visit with Orthopedic Surgery in 1 week and your visit with Infectious Disease in 2 weeks.  Please call our clinic at [phone] if you have any questions or concerns. The best time to call is Monday-Friday from 9am-4pm, but there is someone available 24/7 at the same number. If you need medication refills, please notify your pharmacy one week in advance and they will send [place] a request.   Thank you for letting us take part in your care. We look forward to seeing you in 2 months!

## 2020-08-10 NOTE — Assessment & Plan Note (Signed)
BP 123/71 today, at goal. Continue to monitor.

## 2020-08-11 ENCOUNTER — Other Ambulatory Visit: Payer: Self-pay

## 2020-08-11 NOTE — Telephone Encounter (Signed)
This refill was sent yesterday to [facility]. Hubbard Hartshorn, BSN, RN-BC

## 2020-08-11 NOTE — Telephone Encounter (Signed)
Dulaglutide (TRULICITY) 1.5 SQ/5.8TM SOPN, refill request @  [facility] [place], [place] AT [address] Phone:  [phone]  Fax:  [phone]

## 2020-08-12 NOTE — Progress Notes (Signed)
Internal Medicine Clinic Attending  I saw and evaluated the patient.  I personally confirmed the key portions of the history and exam documented by Dr. Jinwala and I reviewed pertinent patient test results.  The assessment, diagnosis, and plan were formulated together and I agree with the documentation in the resident's note.  

## 2020-08-15 ENCOUNTER — Encounter: Payer: Self-pay | Admitting: Infectious Disease

## 2020-08-17 ENCOUNTER — Other Ambulatory Visit: Payer: Self-pay

## 2020-08-17 ENCOUNTER — Ambulatory Visit (INDEPENDENT_AMBULATORY_CARE_PROVIDER_SITE_OTHER): Payer: Medicare Other | Admitting: Family

## 2020-08-17 ENCOUNTER — Encounter: Payer: Self-pay | Admitting: Family

## 2020-08-17 DIAGNOSIS — Z89422 Acquired absence of other left toe(s): Secondary | ICD-10-CM
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
## 2020-08-18 ENCOUNTER — Telehealth: Payer: Self-pay | Admitting: *Deleted

## 2020-08-18 NOTE — Telephone Encounter (Signed)
Received call from [facility], pharmacist at [place] regarding CK results.  Was 238 11/1, now 91 on 11/8.  OK per Marya Amsler to maintain. Relayed to [employer]. Landis Gandy, RN

## 2020-08-22 ENCOUNTER — Encounter: Payer: Self-pay | Admitting: Infectious Disease

## 2020-08-22 LAB — AEROBIC/ANAEROBIC CULTURE W GRAM STAIN (SURGICAL/DEEP WOUND)

## 2020-08-23 ENCOUNTER — Encounter: Payer: Self-pay | Admitting: Infectious Disease

## 2020-08-23 ENCOUNTER — Ambulatory Visit (INDEPENDENT_AMBULATORY_CARE_PROVIDER_SITE_OTHER): Payer: Medicare Other | Admitting: Infectious Disease

## 2020-08-23 ENCOUNTER — Other Ambulatory Visit: Payer: Self-pay

## 2020-08-23 VITALS — BP 117/79 | HR 105

## 2020-08-23 DIAGNOSIS — I96 Gangrene, not elsewhere classified: Secondary | ICD-10-CM

## 2020-08-23 DIAGNOSIS — M86272 Subacute osteomyelitis, left ankle and foot: Secondary | ICD-10-CM | POA: Diagnosis not present

## 2020-08-23 DIAGNOSIS — Z89422 Acquired absence of other left toe(s): Secondary | ICD-10-CM | POA: Diagnosis not present

## 2020-08-23 DIAGNOSIS — I739 Peripheral vascular disease, unspecified: Secondary | ICD-10-CM

## 2020-08-23 DIAGNOSIS — IMO0002 Reserved for concepts with insufficient information to code with codable children: Secondary | ICD-10-CM

## 2020-08-23 DIAGNOSIS — E1139 Type 2 diabetes mellitus with other diabetic ophthalmic complication: Secondary | ICD-10-CM | POA: Diagnosis not present
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
## 2020-08-25 ENCOUNTER — Other Ambulatory Visit: Payer: Self-pay | Admitting: Infectious Disease

## 2020-08-25 DIAGNOSIS — M86272 Subacute osteomyelitis, left ankle and foot: Secondary | ICD-10-CM

## 2020-08-26 ENCOUNTER — Ambulatory Visit (HOSPITAL_COMMUNITY): Admission: RE | Admit: 2020-08-26 | Payer: Medicare Other | Source: Ambulatory Visit

## 2020-08-29 ENCOUNTER — Encounter: Payer: Self-pay | Admitting: Infectious Disease

## 2020-08-30 ENCOUNTER — Other Ambulatory Visit: Payer: Self-pay

## 2020-08-30 ENCOUNTER — Ambulatory Visit (HOSPITAL_COMMUNITY)
Admission: RE | Admit: 2020-08-30 | Discharge: 2020-08-30 | Disposition: A | Discharge status: Home / Self Care | Payer: Medicare Other | Source: Ambulatory Visit | Attending: Infectious Disease | Admitting: Infectious Disease

## 2020-08-30 DIAGNOSIS — M86272 Subacute osteomyelitis, left ankle and foot: Secondary | ICD-10-CM | POA: Diagnosis present

## 2020-08-30 MED ORDER — GADOBUTROL 1 MMOL/ML IV SOLN
10.0000 mL | Freq: Once | INTRAVENOUS | Status: AC | PRN
  Administered 2020-08-30: 10 mL via INTRAVENOUS

## 2020-08-31 ENCOUNTER — Ambulatory Visit (INDEPENDENT_AMBULATORY_CARE_PROVIDER_SITE_OTHER): Payer: Medicare Other | Admitting: Family

## 2020-08-31 ENCOUNTER — Encounter: Payer: Self-pay | Admitting: Family

## 2020-08-31 VITALS — Ht 75.0 in | Wt 231.0 lb

## 2020-08-31 DIAGNOSIS — Z89422 Acquired absence of other left toe(s): Secondary | ICD-10-CM
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
## 2020-09-05 ENCOUNTER — Other Ambulatory Visit: Payer: Self-pay

## 2020-09-05 ENCOUNTER — Ambulatory Visit (INDEPENDENT_AMBULATORY_CARE_PROVIDER_SITE_OTHER): Payer: Medicare Other | Admitting: Infectious Disease

## 2020-09-05 ENCOUNTER — Encounter: Payer: Self-pay | Admitting: Infectious Disease

## 2020-09-05 VITALS — BP 132/73 | HR 95 | Temp 97.7°F | Resp 16 | Ht 75.0 in | Wt 231.0 lb

## 2020-09-05 DIAGNOSIS — M86171 Other acute osteomyelitis, right ankle and foot: Secondary | ICD-10-CM | POA: Diagnosis not present

## 2020-09-05 DIAGNOSIS — I739 Peripheral vascular disease, unspecified: Secondary | ICD-10-CM | POA: Diagnosis not present

## 2020-09-05 DIAGNOSIS — E1139 Type 2 diabetes mellitus with other diabetic ophthalmic complication: Secondary | ICD-10-CM

## 2020-09-05 DIAGNOSIS — E1140 Type 2 diabetes mellitus with diabetic neuropathy, unspecified: Secondary | ICD-10-CM | POA: Diagnosis not present

## 2020-09-05 DIAGNOSIS — A4902 Methicillin resistant Staphylococcus aureus infection, unspecified site: Secondary | ICD-10-CM

## 2020-09-05 DIAGNOSIS — Z89422 Acquired absence of other left toe(s): Secondary | ICD-10-CM

## 2020-09-05 DIAGNOSIS — E1165 Type 2 diabetes mellitus with hyperglycemia: Secondary | ICD-10-CM

## 2020-09-05 DIAGNOSIS — IMO0002 Reserved for concepts with insufficient information to code with codable children: Secondary | ICD-10-CM

## 2020-09-05 HISTORY — DX: Methicillin resistant Staphylococcus aureus infection, unspecified site: A49.02

## 2020-09-05 MED ORDER — DOXYCYCLINE HYCLATE 100 MG PO TABS: 100.0000 mg | ORAL_TABLET | Freq: Two times a day (BID) | ORAL | 2 refills | Status: DC
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
## 2020-09-07 ENCOUNTER — Other Ambulatory Visit: Payer: Self-pay | Admitting: Internal Medicine

## 2020-09-07 MED ORDER — ACCU-CHEK FASTCLIX LANCETS MISC: 5 refills | Status: DC

## 2020-09-07 NOTE — Telephone Encounter (Signed)
Refill Request-for the following   [facility], [address] AT [place]

## 2020-09-14 ENCOUNTER — Encounter: Payer: Self-pay | Admitting: Family

## 2020-09-14 ENCOUNTER — Ambulatory Visit (INDEPENDENT_AMBULATORY_CARE_PROVIDER_SITE_OTHER): Payer: Medicare Other | Admitting: Family

## 2020-09-14 DIAGNOSIS — Z89422 Acquired absence of other left toe(s): Secondary | ICD-10-CM
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
## 2020-09-19 ENCOUNTER — Other Ambulatory Visit: Payer: Self-pay

## 2020-09-19 MED ORDER — PREGABALIN 100 MG PO CAPS: 100.0000 mg | ORAL_CAPSULE | Freq: Three times a day (TID) | ORAL | 5 refills | Status: DC

## 2020-09-19 NOTE — Telephone Encounter (Signed)
Pt is requesting his pregabalin (LYRICA) 100 MG capsule to be sent to  [facility], [place] AT [address] Phone:  [phone]  Fax:  [phone]

## 2020-09-21 ENCOUNTER — Other Ambulatory Visit: Payer: Self-pay

## 2020-09-21 ENCOUNTER — Ambulatory Visit (INDEPENDENT_AMBULATORY_CARE_PROVIDER_SITE_OTHER): Payer: Medicare Other | Admitting: Podiatry

## 2020-09-21 ENCOUNTER — Encounter: Payer: Self-pay | Admitting: Podiatry

## 2020-09-21 DIAGNOSIS — B351 Tinea unguium: Secondary | ICD-10-CM

## 2020-09-21 DIAGNOSIS — M79675 Pain in left toe(s): Secondary | ICD-10-CM

## 2020-09-21 DIAGNOSIS — E1140 Type 2 diabetes mellitus with diabetic neuropathy, unspecified: Secondary | ICD-10-CM | POA: Diagnosis not present

## 2020-09-21 DIAGNOSIS — L84 Corns and callosities: Secondary | ICD-10-CM | POA: Diagnosis not present

## 2020-09-21 DIAGNOSIS — M79674 Pain in right toe(s): Secondary | ICD-10-CM

## 2020-09-21 NOTE — Progress Notes (Signed)
This patient returns to my office for at risk foot care.  This patient requires this care by a professional since this patient will be at risk due to having diabetes and PAD.   Patient says he had an amputation left foot which is doing well.  He says he just applied a bandage before coming to this office so he requests no treatment on his left foot.      This patient is unable to cut nails himself since the patient cannot reach his nails.These nails are painful walking and wearing shoes. Patient says the skin lesion under his right forefoot  has thickened.   This patient presents for at risk foot care today.  General Appearance  Alert, conversant and in no acute stress.  Vascular  Dorsalis pedis and posterior tibial  pulses are palpable  Right foot..  Capillary return is within normal limits  right. Temperature is within normal limits  bilaterally.  Neurologic  Senn-Weinstein monofilament wire test absent   bilaterally. Muscle power within normal limits bilaterally.  Nails Thick disfigured discolored nails with subungual debris  from hallux to fifth toes right . No evidence of bacterial infection or drainage bilaterally.  Orthopedic  No limitations of motion  feet .  No crepitus or effusions noted.  No bony pathology or digital deformities noted. Hammer toes  B/L.  Plantar flexed second met right foot. Amputation digits left foot.  Skin  Thick necrotic skin lesion sub 2 right foot.  Hemorrhagic callus noted.  No signs of infections or ulcers noted.     Onychomycosis  Pain in right toes  Pain in left toes  Consent was obtained for treatment procedures.   Mechanical debridement of nails 1-5  Right  performed with a nail nipper.  Filed with dremel without incident.   Debridement of skin lesion with a # 15 blade.   Return office visit   9  weeks.                  Told patient to return for periodic foot care and evaluation due to potential at risk complications.   Gardiner Barefoot DPM

## 2020-09-26 ENCOUNTER — Other Ambulatory Visit: Payer: Self-pay | Admitting: Internal Medicine

## 2020-09-28 ENCOUNTER — Encounter: Payer: Self-pay | Admitting: Physician Assistant

## 2020-09-28 ENCOUNTER — Other Ambulatory Visit: Payer: Self-pay

## 2020-09-28 ENCOUNTER — Telehealth: Payer: Self-pay | Admitting: *Deleted

## 2020-09-28 ENCOUNTER — Ambulatory Visit (INDEPENDENT_AMBULATORY_CARE_PROVIDER_SITE_OTHER): Payer: Medicare Other | Admitting: Physician Assistant

## 2020-09-28 VITALS — Ht 75.0 in | Wt 231.0 lb

## 2020-09-28 DIAGNOSIS — M869 Osteomyelitis, unspecified: Secondary | ICD-10-CM

## 2020-09-28 MED ORDER — NITROGLYCERIN 0.2 MG/HR TD PT24
0.2000 mg | MEDICATED_PATCH | Freq: Every day | TRANSDERMAL | 12 refills | Status: DC
Start: 2020-09-28 — End: 2022-09-04

## 2020-09-28 MED ORDER — SILVER SULFADIAZINE 1 % EX CREA: 1.0000 "application " | TOPICAL_CREAM | Freq: Every day | CUTANEOUS | 0 refills | Status: DC

## 2020-09-28 MED ORDER — PENTOXIFYLLINE ER 400 MG PO TBCR: 400.0000 mg | EXTENDED_RELEASE_TABLET | Freq: Three times a day (TID) | ORAL | 3 refills | Status: DC
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
## 2020-09-28 NOTE — Telephone Encounter (Signed)
Per Alyse Low with [facility]: The nurse is reviewing it, once she is finished the schedulers will give the patient a call, I will place a note to the nurse for you

## 2020-09-28 NOTE — Telephone Encounter (Signed)
-----   Message from Pamella Pert, [place] sent at 09/28/2020  8:24 AM EST ----- Regarding: checking appt This pt had a vascular consult put in 2 weeks ago and has not hear back yet. Can you check on this please?

## 2020-09-29 ENCOUNTER — Telehealth: Payer: Self-pay | Admitting: Orthopedic Surgery

## 2020-09-29 ENCOUNTER — Ambulatory Visit (INDEPENDENT_AMBULATORY_CARE_PROVIDER_SITE_OTHER): Payer: Medicare Other | Admitting: Infectious Disease

## 2020-09-29 ENCOUNTER — Encounter: Payer: Self-pay | Admitting: Infectious Disease

## 2020-09-29 VITALS — BP 107/70 | HR 101 | Temp 98.3°F

## 2020-09-29 DIAGNOSIS — I700 Atherosclerosis of aorta: Secondary | ICD-10-CM

## 2020-09-29 DIAGNOSIS — I2510 Atherosclerotic heart disease of native coronary artery without angina pectoris: Secondary | ICD-10-CM | POA: Diagnosis not present

## 2020-09-29 DIAGNOSIS — E1140 Type 2 diabetes mellitus with diabetic neuropathy, unspecified: Secondary | ICD-10-CM

## 2020-09-29 DIAGNOSIS — M869 Osteomyelitis, unspecified: Secondary | ICD-10-CM

## 2020-09-29 DIAGNOSIS — A4902 Methicillin resistant Staphylococcus aureus infection, unspecified site: Secondary | ICD-10-CM

## 2020-09-29 DIAGNOSIS — I739 Peripheral vascular disease, unspecified: Secondary | ICD-10-CM

## 2020-09-29 MED ORDER — DOXYCYCLINE HYCLATE 100 MG PO TABS: 100.0000 mg | ORAL_TABLET | Freq: Two times a day (BID) | ORAL | 4 refills | Status: DC
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
## 2020-09-29 NOTE — Telephone Encounter (Signed)
Pt called if he could now shower please give him a call please at [phone]

## 2020-09-29 NOTE — Telephone Encounter (Signed)
Think he could shower now?

## 2020-09-29 NOTE — Telephone Encounter (Signed)
sure

## 2020-09-29 NOTE — Telephone Encounter (Signed)
Patient aware he may shower ut not submerge his body in a bath

## 2020-09-30 LAB — SEDIMENTATION RATE: Sed Rate: 28 mm/h — ABNORMAL HIGH (ref 0–20)

## 2020-09-30 LAB — CBC WITH DIFFERENTIAL/PLATELET
Absolute Monocytes: 627 cells/uL (ref 200–950)
Basophils Absolute: 62 cells/uL (ref 0–200)
Basophils Relative: 1.1 %
Eosinophils Absolute: 728 cells/uL — ABNORMAL HIGH (ref 15–500)
Eosinophils Relative: 13 %
HCT: 39.4 % (ref 38.5–50.0)
Hemoglobin: 12.6 g/dL — ABNORMAL LOW (ref 13.2–17.1)
Lymphs Abs: 986 cells/uL (ref 850–3900)
MCH: 25.9 pg — ABNORMAL LOW (ref 27.0–33.0)
MCHC: 32 g/dL (ref 32.0–36.0)
MCV: 80.9 fL (ref 80.0–100.0)
MPV: 10.4 fL (ref 7.5–12.5)
Monocytes Relative: 11.2 %
Neutro Abs: 3198 cells/uL (ref 1500–7800)
Neutrophils Relative %: 57.1 %
Platelets: 285 10*3/uL (ref 140–400)
RBC: 4.87 10*6/uL (ref 4.20–5.80)
RDW: 13.9 % (ref 11.0–15.0)
Total Lymphocyte: 17.6 %
WBC: 5.6 10*3/uL (ref 3.8–10.8)

## 2020-09-30 LAB — BASIC METABOLIC PANEL WITH GFR
BUN: 17 mg/dL (ref 7–25)
CO2: 28 mmol/L (ref 20–32)
Calcium: 10.1 mg/dL (ref 8.6–10.3)
Chloride: 102 mmol/L (ref 98–110)
Creat: 0.98 mg/dL (ref 0.70–1.25)
GFR, Est African American: 96 mL/min/{1.73_m2} (ref >=60)
GFR, Est Non African American: 83 mL/min/{1.73_m2} (ref >=60)
Glucose, Bld: 108 mg/dL — ABNORMAL HIGH (ref 65–99)
Potassium: 5.1 mmol/L (ref 3.5–5.3)
Sodium: 138 mmol/L (ref 135–146)

## 2020-09-30 LAB — C-REACTIVE PROTEIN: CRP: 9 mg/L — ABNORMAL HIGH (ref <8.0)

## 2020-10-01 IMAGING — CR DG CHEST 2V
2 series · 2 of 2 positions shown · non-contrast
Comparison: 01/16/2018

CLINICAL DATA: Shortness of breath and chest pain

EXAM:
CHEST - 2 VIEW

[chest lat]
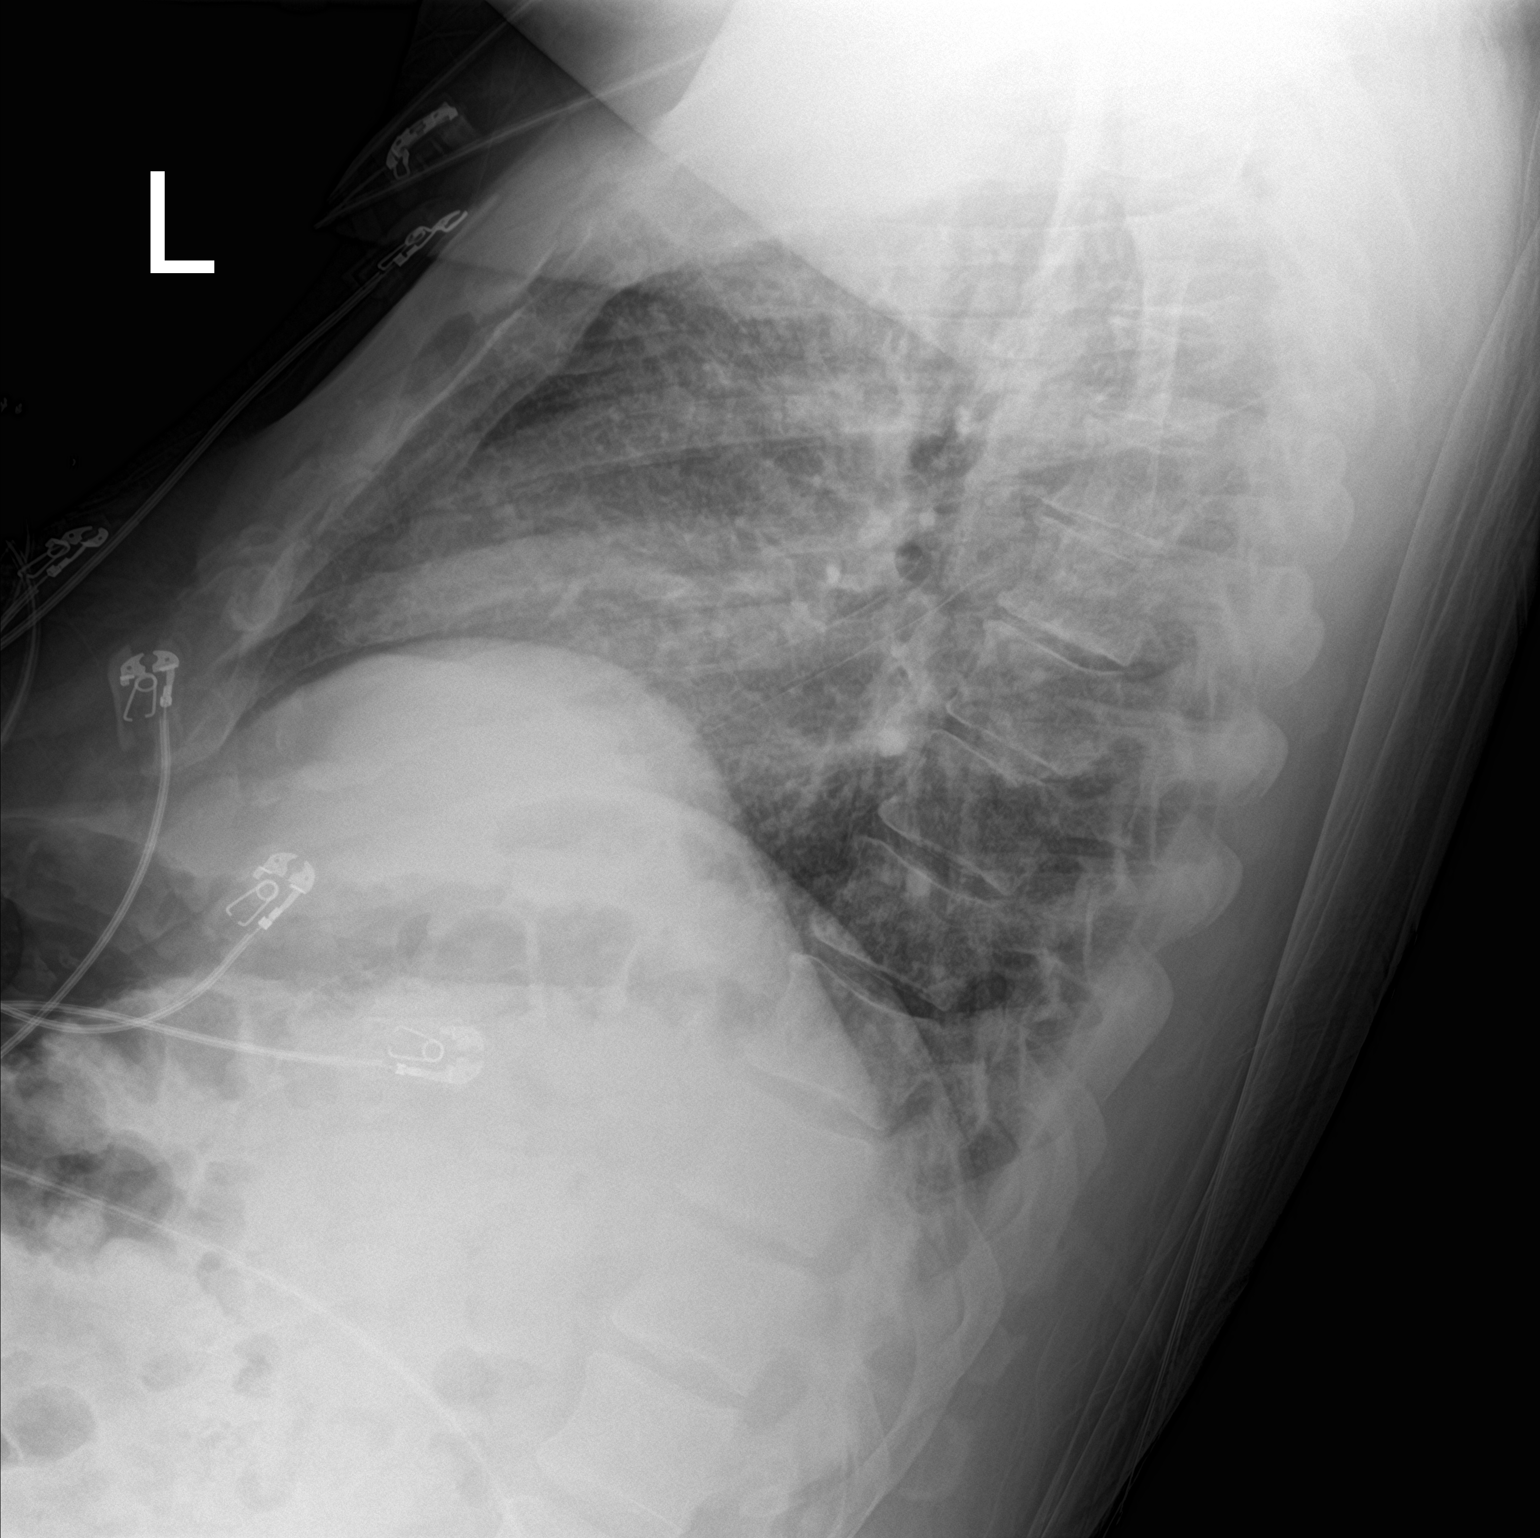

[chest ap]
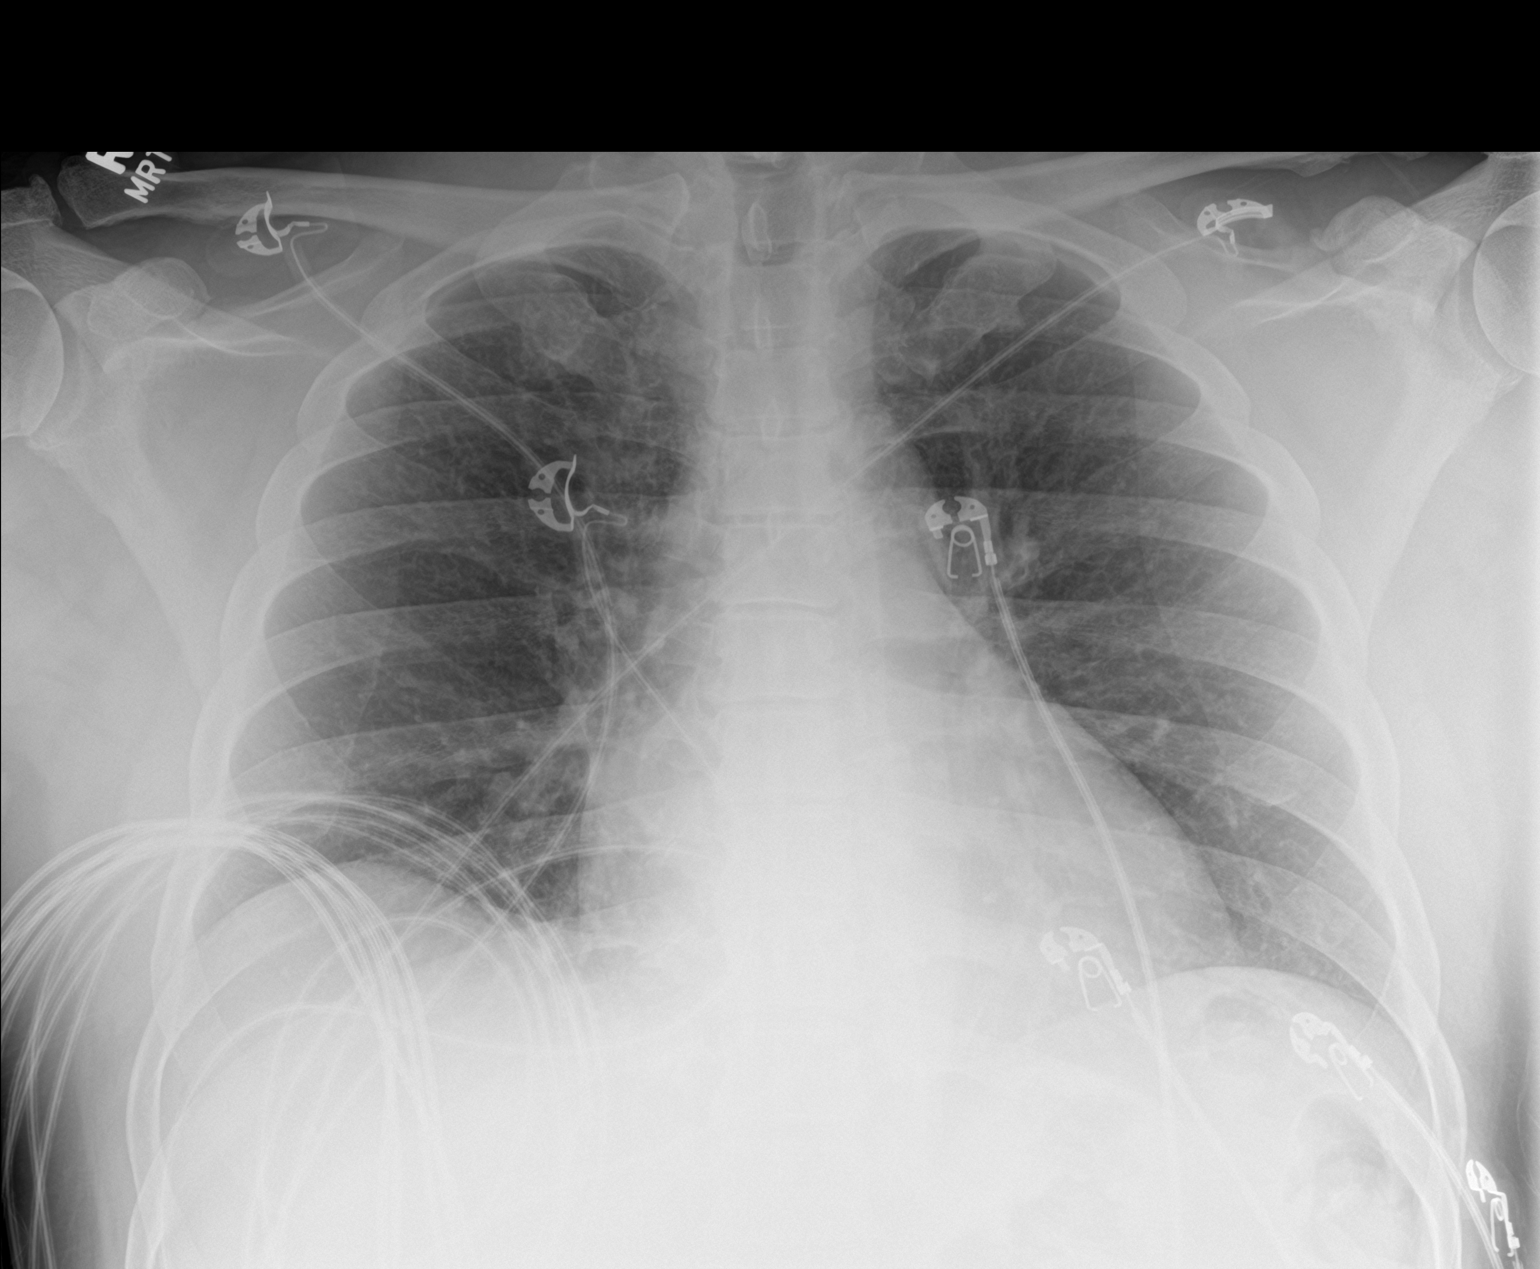

[2 of 2 positions shown; findings below may reference images not displayed]

FINDINGS: Artifact from EKG leads.

Normal heart size and mediastinal contours. No acute infiltrate or
edema. No effusion or pneumothorax. No acute osseous findings.
IMPRESSION: Negative chest.

## 2020-10-10 ENCOUNTER — Encounter: Payer: Self-pay | Admitting: Internal Medicine

## 2020-10-10 ENCOUNTER — Ambulatory Visit (INDEPENDENT_AMBULATORY_CARE_PROVIDER_SITE_OTHER): Payer: Medicare Other | Admitting: Internal Medicine

## 2020-10-10 ENCOUNTER — Other Ambulatory Visit: Payer: Self-pay

## 2020-10-10 VITALS — BP 121/73 | HR 96 | Temp 98.2°F | Ht 75.0 in | Wt 235.2 lb

## 2020-10-10 DIAGNOSIS — E1165 Type 2 diabetes mellitus with hyperglycemia: Secondary | ICD-10-CM

## 2020-10-10 DIAGNOSIS — E1142 Type 2 diabetes mellitus with diabetic polyneuropathy: Secondary | ICD-10-CM | POA: Diagnosis not present

## 2020-10-10 DIAGNOSIS — M86272 Subacute osteomyelitis, left ankle and foot: Secondary | ICD-10-CM

## 2020-10-10 DIAGNOSIS — I739 Peripheral vascular disease, unspecified: Secondary | ICD-10-CM

## 2020-10-10 DIAGNOSIS — Z89422 Acquired absence of other left toe(s): Secondary | ICD-10-CM

## 2020-10-10 DIAGNOSIS — J449 Chronic obstructive pulmonary disease, unspecified: Secondary | ICD-10-CM

## 2020-10-10 DIAGNOSIS — J4489 Other specified chronic obstructive pulmonary disease: Secondary | ICD-10-CM

## 2020-10-10 DIAGNOSIS — E1140 Type 2 diabetes mellitus with diabetic neuropathy, unspecified: Secondary | ICD-10-CM

## 2020-10-10 DIAGNOSIS — I10 Essential (primary) hypertension: Secondary | ICD-10-CM | POA: Diagnosis not present

## 2020-10-10 DIAGNOSIS — IMO0002 Reserved for concepts with insufficient information to code with codable children: Secondary | ICD-10-CM

## 2020-10-10 DIAGNOSIS — E1139 Type 2 diabetes mellitus with other diabetic ophthalmic complication: Secondary | ICD-10-CM

## 2020-10-10 LAB — POCT GLYCOSYLATED HEMOGLOBIN (HGB A1C): Hemoglobin A1C: 6.7 % — AB (ref 4.0–5.6)

## 2020-10-10 LAB — GLUCOSE, CAPILLARY: Glucose-Capillary: 113 mg/dL — ABNORMAL HIGH (ref 70–99)

## 2020-10-10 NOTE — Patient Instructions (Signed)
Thank you, Richard [name] for allowing [place] to provide your care today. Today we discussed diabetes and foot wound.    I have ordered the following labs for you:   Lab Orders     Glucose, capillary     POC Hbg A1C   Tests ordered today:  none  Referrals ordered today:   Referral Orders  No referral(s) requested today      I have ordered the following medication/changed the following medications:   Stop the following medications: There are no discontinued medications.   Start the following medications: No orders of the defined types were placed in this encounter.    Follow up: 3 months    Remember: to follow up with your other providers.  Should you have any questions or concerns please call the internal medicine clinic at [phone].     Dellia Cloud, D.O. [facility]

## 2020-10-10 NOTE — Assessment & Plan Note (Signed)
>>  ASSESSMENT AND PLAN FOR HISTORY OF COMPLETE RAY AMPUTATION OF FIFTH TOE OF LEFT FOOT (HCC) WRITTEN ON 10/10/2020 11:24 AM BY COE, BENJAMIN, MD  Patient is status post complete rehabilitation of the left fourth and  fifth digits secondary to osteomyelitis.  Patient has been managed by Dr. Fontaine Ice with wound care and antibiotic therapy. He recently had wound VAC removed and is performing dressing changes daily with silver  sulfadiazine .  He is also on antibiotic therapy with 100 mg twice daily.  He denies any medication side effects from his doxycycline  such as diarrhea or GI upset. He denies any pain, smell, or exudates.  He lives alone but states that he is able to ambulate well despite the wound.  He uses a wheelchair in the kitchen to assist him while he is making food.  Patient is no longer receiving physical therapy but continues to have a nurse aide once a week to help him with his wound.  Patient was recently evaluated by Dr. Fontaine Ice who stated concern regarding his elevated inflammatory markers concerning for ongoing osteomyelitis.  Patient has a follow-up appointment in January with Dr. Fontaine Ice as well as with orthopedic surgery.  On exam today, the patient's wound appeared clean with fibrinous exudate and some eschar at the upper border of the wound.  There is no foul odor or purulent discharge noted.  Plan: -Repeat CBC with differential today -Continue wound changes daily -Continue sufer sulfadiazine  on wound daily  -Continue antibiotic therapy per Dr. Fontaine Ice with doxycycline  - F/u with orthopedic surgery in January

## 2020-10-10 NOTE — Assessment & Plan Note (Addendum)
Patient has had recent ABIs that were negative for peripheral artery disease.  Patient has had multiple lower extremity wounds in the past with his most recent diabetic wound on his left fourth and fifth digit that is status post amputation.  There has been some concern that these wounds have had difficulty with healing which could be secondary to his diabetes, but PAD involvement cannot be ruled out.  Although he has a history of chronic wounds on his right lower extremity, there is no evidence of this today. Patient was referred to vascular for further evaluation and management of underlying PAD.  He has an appointment set up in February.   -Continue ASA 81 mg and atorvastatin 40 mg daily for secondary prevention.

## 2020-10-10 NOTE — Progress Notes (Signed)
CC: Diabetes Mellitus   HPI:  [name] is a 60 y.o. male with a past medical history stated below and presents today for reevaluation of her diabetes mellitus. Please see problem based assessment and plan for additional details.  Past Medical History:  Diagnosis Date  . Asthma   . Chest pain 06/28/2016  . Chronic bronchitis ([place])   . Clostridium difficile colitis 09/09/2014  . Constipation 09/26/2018  . COPD (chronic obstructive pulmonary disease) ([place])   . Eczema   . Essential hypertension   . 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 Hyperlipidemia   . MRSA infection 09/05/2020  . Need for Tdap vaccination 09/19/2018  . Neuromuscular disorder ([facility])    neuropathy in feet  . 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 Substance abuse ([place])    crack - recovered x 30 yrs  . Type II diabetes mellitus ([place])    diagnosed in 2013    Current Outpatient Medications on File Prior to Visit  Medication Sig Dispense Refill  . Accu-Chek FastClix Lancets MISC Check blood sugar two times a day 204 each 5  . albuterol (PROVENTIL) (2.5 MG/3ML) 0.083% nebulizer solution Take 3 mLs (2.5 mg total) by nebulization every 6 (six) hours as needed for wheezing or shortness of breath. 75 mL 11  . albuterol (VENTOLIN HFA) 108 (90 Base) MCG/ACT inhaler INHALE 2 PUFFS INTO THE LUNGS EVERY 4 HOURS AS NEEDED FOR WHEEZING OR SHORTNESS OF BREATH 18 g 1  . ascorbic acid (VITAMIN C) 500 MG tablet Take 500 mg by mouth daily.    Marland Kitchen aspirin EC 81 MG EC tablet Take 1 tablet (81 mg total) by mouth daily. 30 tablet 3  . atorvastatin (LIPITOR) 40 MG tablet Take 1 tablet (40 mg total) by mouth daily. 90 tablet 3  . Blood Glucose Monitoring Suppl (ACCU-CHEK GUIDE) w/Device KIT 1 each by Does not apply route 2 (two) times daily. 1 kit 1  . diclofenac sodium (VOLTAREN) 1 % GEL Apply 4 g topically 4 (four) times daily as needed. 100 g 5   . doxycycline (VIBRA-TABS) 100 MG tablet Take 1 tablet (100 mg total) by mouth 2 (two) times daily. 60 tablet 4  . Dulaglutide (TRULICITY) 1.5 DZ/3.2DJ SOPN Inject 1.5 mg into the skin once a week. 12 mL 1  . DULERA 200-5 MCG/ACT AERO INHALE 2 PUFFS INTO THE LUNGS TWICE DAILY (Patient taking differently: Inhale 2 puffs into the lungs in the morning and at bedtime.) 13 g 3  . Empagliflozin-metFORMIN HCl (SYNJARDY) 02-999 MG TABS Take 1 tablet by mouth 2 (two) times daily. 180 tablet 3  . glucose blood (ACCU-CHEK GUIDE) test strip Check blood sugar two times a day 200 each 5  . ibuprofen (ADVIL,MOTRIN) 600 MG tablet Take 1 tablet (600 mg total) by mouth every 6 (six) hours as needed. 30 tablet 0  . Lancets Misc. (UNISTIK 2 NORMAL) MISC Check blood sugar two times a day    . latanoprost (XALATAN) 0.005 % ophthalmic solution Place 1 drop into both eyes at bedtime.     Marland Kitchen levocetirizine (XYZAL) 5 MG tablet Take 1 tablet (5 mg total) by mouth every evening. 90 tablet 3  . methocarbamol (ROBAXIN) 500 MG tablet Take 2 tablets (1,000 mg total) by mouth every 8 (eight) hours as needed for muscle spasms. 30 tablet 0  . montelukast (SINGULAIR) 10 MG  tablet Take 1 tablet (10 mg total) by mouth at bedtime. 90 tablet 3  . Multiple Vitamins-Minerals (MULTIVITAMIN ADULT PO) Take 1 tablet by mouth daily.    . nitroGLYCERIN (NITRODUR - DOSED IN MG/24 HR) 0.2 mg/hr patch Place 1 patch (0.2 mg total) onto the skin daily. 30 patch 12  . pentoxifylline (TRENTAL) 400 MG CR tablet Take 1 tablet (400 mg total) by mouth 3 (three) times daily with meals. 90 tablet 3  . pioglitazone (ACTOS) 30 MG tablet Take 1 tablet (30 mg total) by mouth daily. 90 tablet 3  . pregabalin (LYRICA) 100 MG capsule Take 1 capsule (100 mg total) by mouth 3 (three) times daily. 90 capsule 5  . silver sulfADIAZINE (SILVADENE) 1 % cream Apply 1 application topically daily. 50 g 0  . timolol (TIMOPTIC) 0.5 % ophthalmic solution Place 1 drop into both  eyes 2 (two) times daily.   10  . triamcinolone ointment (KENALOG) 0.1 % Apply 1 application topically 2 (two) times daily as needed. 80 g 5  . VYZULTA 0.024 % SOLN Apply 1 drop to eye at bedtime.      No current facility-administered medications on file prior to visit.    Family History  Problem Relation Age of Onset  . Hypertension Mother   . Hypertension Sister   . Glaucoma Sister   . Colon cancer Neg Hx   . Colon polyps Neg Hx   . Esophageal cancer Neg Hx   . Rectal cancer Neg Hx   . Stomach cancer Neg Hx     Social History   Socioeconomic History  . Marital status: Single    Spouse name: Not on file  . Number of children: Not on file  . Years of education: Not on file  . Highest education level: Not on file  Occupational History  . Not on file  Tobacco Use  . Smoking status: Former Smoker    Packs/day: 0.50    Years: 45.00    Pack years: 22.50    Types: Cigarettes    Quit date: 01/12/2018    Years since quitting: 2.7  . Smokeless tobacco: Never Used  Vaping Use  . Vaping Use: Never used  Substance and Sexual Activity  . Alcohol use: Not Currently    Alcohol/week: 0.0 standard drinks    Comment: 08/24/2019 "nothing since <2010"  . Drug use: Not Currently    Types: Cocaine    Comment: 10/23/2018 "nothing since <1990"  . Sexual activity: Not on file  Other Topics Concern  . Not on file  Social History Narrative   Currently working on obtaining GED from [employer]- July 2018   Social Determinants of Health   Financial Resource Strain: Not on file  Food Insecurity: Not on file  Transportation Needs: Not on file  Physical Activity: Not on file  Stress: Not on file  Social Connections: Not on file  Intimate Partner Violence: Not on file    Review of Systems: ROS negative except for what is noted on the assessment and plan.  Vitals:   10/10/20 0911  BP: 121/73  Pulse: 96  Temp: 98.2 F (36.8 C)  TempSrc: Oral  SpO2: 98%  Weight: 235 lb 3.2 oz (106.7 kg)   Height: 6\' 3"  (1.905 m)     Physical Exam: Physical Exam Constitutional:      Appearance: Normal appearance.  HENT:     Head: Normocephalic and atraumatic.  Cardiovascular:     Rate and Rhythm: Normal rate and  regular rhythm.     Pulses: Normal pulses.     Heart sounds: Normal heart sounds.  Pulmonary:     Effort: Pulmonary effort is normal.     Breath sounds: Normal breath sounds. No stridor. No wheezing, rhonchi or rales.  Abdominal:     General: There is no distension.     Palpations: Abdomen is soft.     Tenderness: There is no abdominal tenderness.  Musculoskeletal:        General: Swelling (left foot swelling around surgical wound) and deformity (Of left fourth and fifth digit on left foot with surgical wound.) present. Normal range of motion.  Skin:    General: Skin is warm and dry.     Comments: - Left foot surgical wound with eschar at the superior border and fibrinous discharge throughout.   - Surrounding edema without significant erythema or rash.   - No change in smell nor purulent drainage noted.  Patient had clear wound borders without maceration.   - No evidence of sinus tracking. -1+ pulses bilateral lower extremities -Capillary refill within normal limits. -Diffuse neuropathy on the plantar and dorsal surface with decreased sensation to crude touch and pain  Neurological:     General: No focal deficit present.     Mental Status: He is alert and oriented to person, place, and time. Mental status is at baseline.     Motor: No weakness.  Psychiatric:        Mood and Affect: Mood normal.        Behavior: Behavior normal.      Assessment & Plan:   See Encounters Tab for problem based charting.  Patient discussed with Dr. Kelle Darting, D.O. [facility], PGY-2 Pager: [phone], Phone: [phone] Date 10/10/2020 Time 11:40 AM

## 2020-10-10 NOTE — Assessment & Plan Note (Signed)
Patient has significant peripheral neuropathy in his lower extremities bilaterally.  Has decreased sensation to crude touch, pain, temperature, monofilament.  Currently taking pregabalin 100 mg 3 times daily.  States that this is significantly improved his neuropathic pain.  -Continue pregabalin 100 mg 3 times daily

## 2020-10-10 NOTE — Assessment & Plan Note (Addendum)
Patient is status post complete rehabilitation of the left fourth and  fifth digits secondary to osteomyelitis.  Patient has been managed by Dr. Algis Liming with wound care and antibiotic therapy. He recently had wound VAC removed and is performing dressing changes daily with silver sulfadiazine.  He is also on antibiotic therapy with 100 mg twice daily.  He denies any medication side effects from his doxycycline such as diarrhea or GI upset. He denies any pain, smell, or exudates.  He lives alone but states that he is able to ambulate well despite the wound.  He uses a wheelchair in the kitchen to assist him while he is making food.  Patient is no longer receiving physical therapy but continues to have a nurse aide once a week to help him with his wound.  Patient was recently evaluated by Dr. Algis Liming who stated concern regarding his elevated inflammatory markers concerning for ongoing osteomyelitis.  Patient has a follow-up appointment in January with Dr. Algis Liming as well as with orthopedic surgery.  On exam today, the patient's wound appeared clean with fibrinous exudate and some eschar at the upper border of the wound.  There is no foul odor or purulent discharge noted.  Plan: -Repeat CBC with differential today -Continue wound changes daily -Continue sufer sulfadiazine on wound daily  -Continue antibiotic therapy per Dr. Algis Liming with doxycycline - F/u with orthopedic surgery in January

## 2020-10-10 NOTE — Assessment & Plan Note (Signed)
Patient has a history of asthma-COPD overlap syndrome.  Recent CBC showed significantly elevated eosinophilia in the 700s.  Patient denies any significant symptoms of asthma or COPD today including shortness of breath, increased sputum production, increased oxygen requirement/worsening exercise tolerance.  He is not having any wheezing on exam and sounds are clear to auscultation bilaterally.  Plan: -Repeat CBC with differential today. -Continue albuterol inhaler and nebulizer -Continue Dulera 200 g twice daily -Continue Singulair 10 mg nightly -Gradually patient on his smoking cessation in 2019.

## 2020-10-10 NOTE — Assessment & Plan Note (Signed)
Patient presents for further evaluation and management of his DM. His DM is controlled on Synjardy 02-999 mg twice daily, Trulicity 1.5 mg weekly, Actos 30 mg daily. He admits to being compliant with his diabetes medications. He denies Medication side effects. He Has a home glucometer was recently ordered at his last visit.Marland Kitchen He has identified the following barriers to his DM management: None. Patient has not recently seen [facility]. He has had 1 recent hospitalizations recently for DM due to lower extremity osteomyelitis secondary to a diabetic foot ulcer.   Last eye exam was on 04/28/2020 and showed fundoscopic exam consistent with diabetes retinopathy.  Last foot exam was 01/03 and showed warm, good capillary refill, DP reduced bilateral, PT reduced bilateral, reduced sensation at dorsal and plantar surfaces and left foot surgical wound with fibrinous exudate. Patient has had the following complications as a result of their DM: retinopathy, peripheral neuropathy, cardiovascular disease and peripheral vascular disease Patients current random blood sugar was  Glucose-Capillary  Date/Time Value Ref Range Status  10/10/2020 09:18 AM 113 (H) 70 - 99 mg/dL Final    Comment:    Glucose reference range applies only to samples taken after fasting for at least 8 hours.    Last A1c was  Lab Results  Component Value Date   HGBA1C 6.7 (A) 10/10/2020  .    Plan: Discussed general issues about diabetes pathophysiology and management. Counseling at today's visit: focused on the need for regular aerobic exercise and discussed the advantages of a diet low in carbohydrates. Encouraged aerobic exercise. Discussed foot care. Reminded to get yearly retinal exam. Target Range blood sugar: 100-120 and target range for A1c: Patient is at goal at 6.7 today We identified the following goals during this appointment: adherence to ADA/ carb modified diet exercise 3-6 days/week adherence to prescribed medication  regimen

## 2020-10-10 NOTE — Progress Notes (Signed)
Internal Medicine Clinic Attending  Case discussed with Dr. Coe  At the time of the visit.  We reviewed the resident's history and exam and pertinent patient test results.  I agree with the assessment, diagnosis, and plan of care documented in the resident's note.  

## 2020-10-11 LAB — CBC WITH DIFFERENTIAL/PLATELET
Basophils Absolute: 0.1 10*3/uL (ref 0.0–0.2)
Basos: 1 %
EOS (ABSOLUTE): 0.8 10*3/uL — ABNORMAL HIGH (ref 0.0–0.4)
Eos: 14 %
Hematocrit: 38.1 % (ref 37.5–51.0)
Hemoglobin: 12.3 g/dL — ABNORMAL LOW (ref 13.0–17.7)
Immature Grans (Abs): 0.1 10*3/uL (ref 0.0–0.1)
Immature Granulocytes: 1 %
Lymphocytes Absolute: 1.1 10*3/uL (ref 0.7–3.1)
Lymphs: 19 %
MCH: 25.8 pg — ABNORMAL LOW (ref 26.6–33.0)
MCHC: 32.3 g/dL (ref 31.5–35.7)
MCV: 80 fL (ref 79–97)
Monocytes Absolute: 0.6 10*3/uL (ref 0.1–0.9)
Monocytes: 10 %
Neutrophils Absolute: 3.4 10*3/uL (ref 1.4–7.0)
Neutrophils: 55 %
Platelets: 327 10*3/uL (ref 150–450)
RBC: 4.76 x10E6/uL (ref 4.14–5.80)
RDW: 13.6 % (ref 11.6–15.4)
WBC: 6.1 10*3/uL (ref 3.4–10.8)

## 2020-10-19 ENCOUNTER — Encounter: Payer: Self-pay | Admitting: Infectious Disease

## 2020-10-19 ENCOUNTER — Ambulatory Visit (INDEPENDENT_AMBULATORY_CARE_PROVIDER_SITE_OTHER): Payer: Medicare Other | Admitting: Infectious Disease

## 2020-10-19 ENCOUNTER — Other Ambulatory Visit: Payer: Self-pay

## 2020-10-19 VITALS — BP 119/71 | HR 108 | Temp 98.1°F | Wt 234.0 lb

## 2020-10-19 DIAGNOSIS — E1139 Type 2 diabetes mellitus with other diabetic ophthalmic complication: Secondary | ICD-10-CM

## 2020-10-19 DIAGNOSIS — Z89422 Acquired absence of other left toe(s): Secondary | ICD-10-CM

## 2020-10-19 DIAGNOSIS — M869 Osteomyelitis, unspecified: Secondary | ICD-10-CM | POA: Diagnosis not present

## 2020-10-19 DIAGNOSIS — IMO0002 Reserved for concepts with insufficient information to code with codable children: Secondary | ICD-10-CM

## 2020-10-19 DIAGNOSIS — E1165 Type 2 diabetes mellitus with hyperglycemia: Secondary | ICD-10-CM

## 2020-10-19 DIAGNOSIS — A4902 Methicillin resistant Staphylococcus aureus infection, unspecified site: Secondary | ICD-10-CM
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
## 2020-10-20 LAB — CBC WITH DIFFERENTIAL/PLATELET
Absolute Monocytes: 504 cells/uL (ref 200–950)
Basophils Absolute: 69 cells/uL (ref 0–200)
Basophils Relative: 1.3 %
Eosinophils Absolute: 641 cells/uL — ABNORMAL HIGH (ref 15–500)
Eosinophils Relative: 12.1 %
HCT: 39.3 % (ref 38.5–50.0)
Hemoglobin: 12.7 g/dL — ABNORMAL LOW (ref 13.2–17.1)
Lymphs Abs: 991 cells/uL (ref 850–3900)
MCH: 26 pg — ABNORMAL LOW (ref 27.0–33.0)
MCHC: 32.3 g/dL (ref 32.0–36.0)
MCV: 80.5 fL (ref 80.0–100.0)
MPV: 10.3 fL (ref 7.5–12.5)
Monocytes Relative: 9.5 %
Neutro Abs: 3095 cells/uL (ref 1500–7800)
Neutrophils Relative %: 58.4 %
Platelets: 310 10*3/uL (ref 140–400)
RBC: 4.88 10*6/uL (ref 4.20–5.80)
RDW: 13.7 % (ref 11.0–15.0)
Total Lymphocyte: 18.7 %
WBC: 5.3 10*3/uL (ref 3.8–10.8)

## 2020-10-20 LAB — BASIC METABOLIC PANEL WITH GFR
BUN: 18 mg/dL (ref 7–25)
CO2: 24 mmol/L (ref 20–32)
Calcium: 9.5 mg/dL (ref 8.6–10.3)
Chloride: 103 mmol/L (ref 98–110)
Creat: 0.92 mg/dL (ref 0.70–1.25)
GFR, Est African American: 104 mL/min/{1.73_m2} (ref >=60)
GFR, Est Non African American: 89 mL/min/{1.73_m2} (ref >=60)
Glucose, Bld: 104 mg/dL — ABNORMAL HIGH (ref 65–99)
Potassium: 4.9 mmol/L (ref 3.5–5.3)
Sodium: 137 mmol/L (ref 135–146)

## 2020-10-20 LAB — C-REACTIVE PROTEIN: CRP: 11.8 mg/L — ABNORMAL HIGH (ref <8.0)

## 2020-10-20 LAB — SEDIMENTATION RATE: Sed Rate: 28 mm/h — ABNORMAL HIGH (ref 0–20)

## 2020-10-26 ENCOUNTER — Encounter: Payer: Self-pay | Admitting: Physician Assistant

## 2020-10-26 ENCOUNTER — Ambulatory Visit (INDEPENDENT_AMBULATORY_CARE_PROVIDER_SITE_OTHER): Payer: Medicare Other | Admitting: Physician Assistant

## 2020-10-26 DIAGNOSIS — M869 Osteomyelitis, unspecified: Secondary | ICD-10-CM
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
## 2020-10-27 ENCOUNTER — Ambulatory Visit (INDEPENDENT_AMBULATORY_CARE_PROVIDER_SITE_OTHER): Payer: Medicare Other

## 2020-10-27 ENCOUNTER — Ambulatory Visit (INDEPENDENT_AMBULATORY_CARE_PROVIDER_SITE_OTHER): Payer: Medicare Other | Admitting: Orthopedic Surgery

## 2020-10-27 ENCOUNTER — Encounter: Payer: Self-pay | Admitting: Orthopedic Surgery

## 2020-10-27 ENCOUNTER — Other Ambulatory Visit: Payer: Self-pay

## 2020-10-27 VITALS — Ht 75.0 in | Wt 234.0 lb

## 2020-10-27 DIAGNOSIS — T8781 Dehiscence of amputation stump: Secondary | ICD-10-CM

## 2020-10-27 DIAGNOSIS — M79672 Pain in left foot: Secondary | ICD-10-CM

## 2020-10-27 DIAGNOSIS — S91309S Unspecified open wound, unspecified foot, sequela: Secondary | ICD-10-CM
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
## 2020-11-03 IMAGING — DX DG LUMBAR SPINE COMPLETE 4+V
5 series · 5 of 5 positions shown · non-contrast
Comparison: CT scan of May 28, 2017.

CLINICAL DATA: Low back pain after fall.

EXAM:
LUMBAR SPINE - COMPLETE 4+ VIEW

[l-spine obl (1 of 2)]
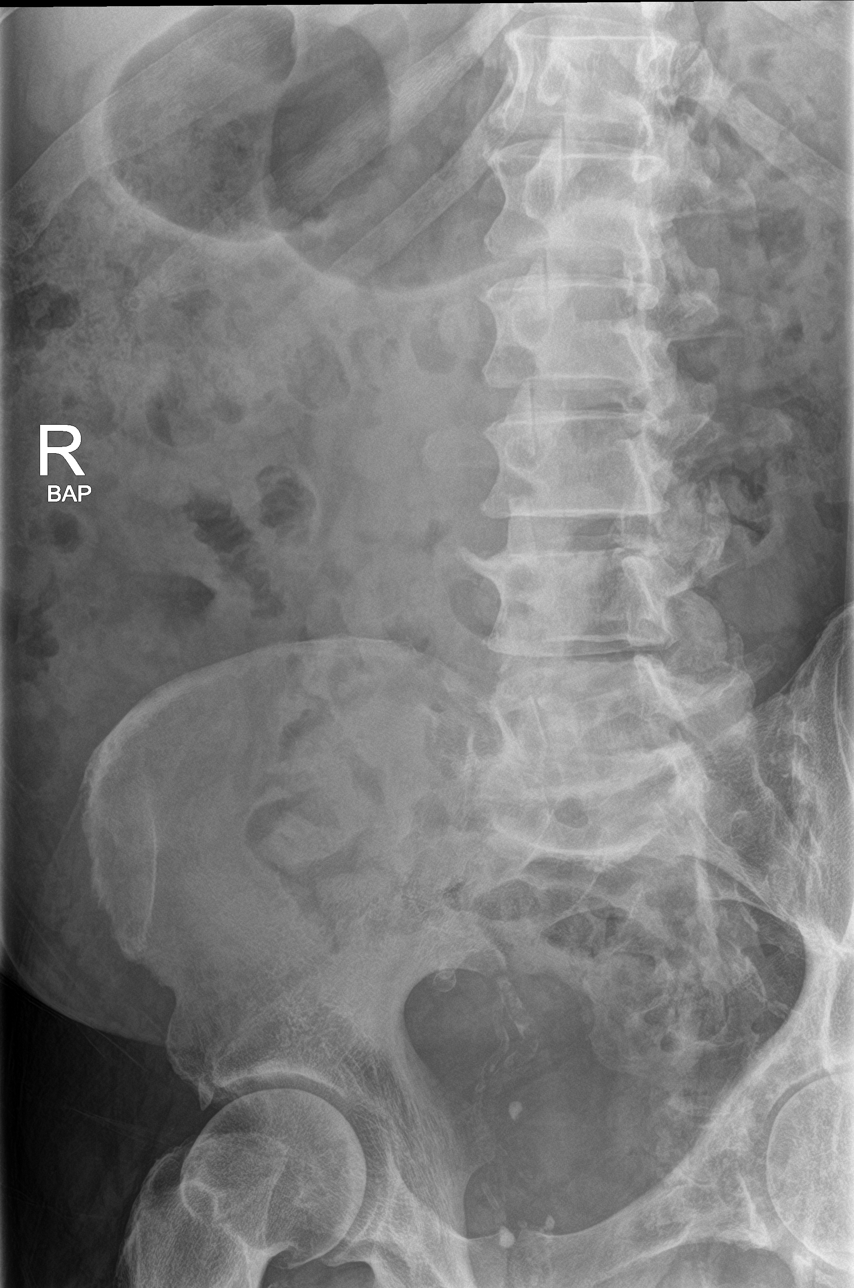

[l-spine obl (2 of 2)]
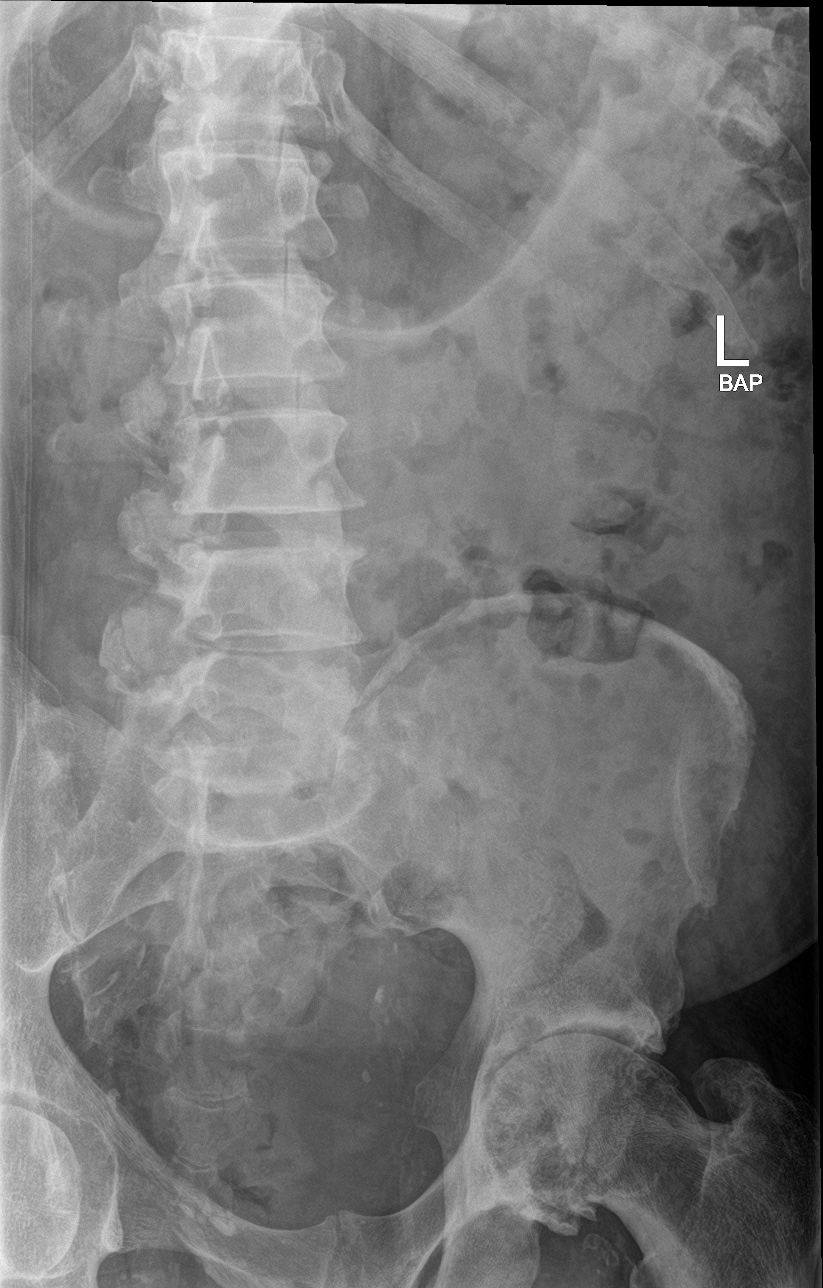

[l-spine lat]
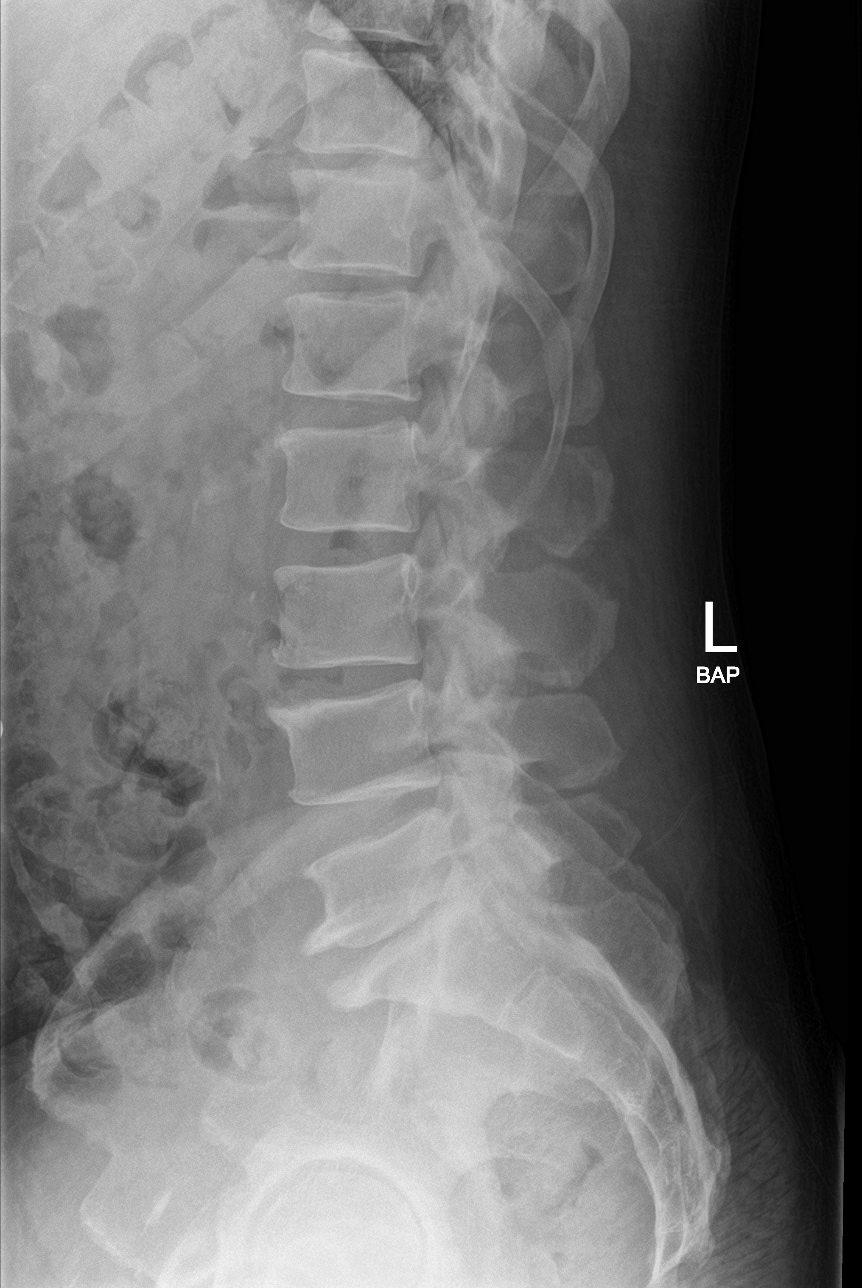

[l-spine spot]
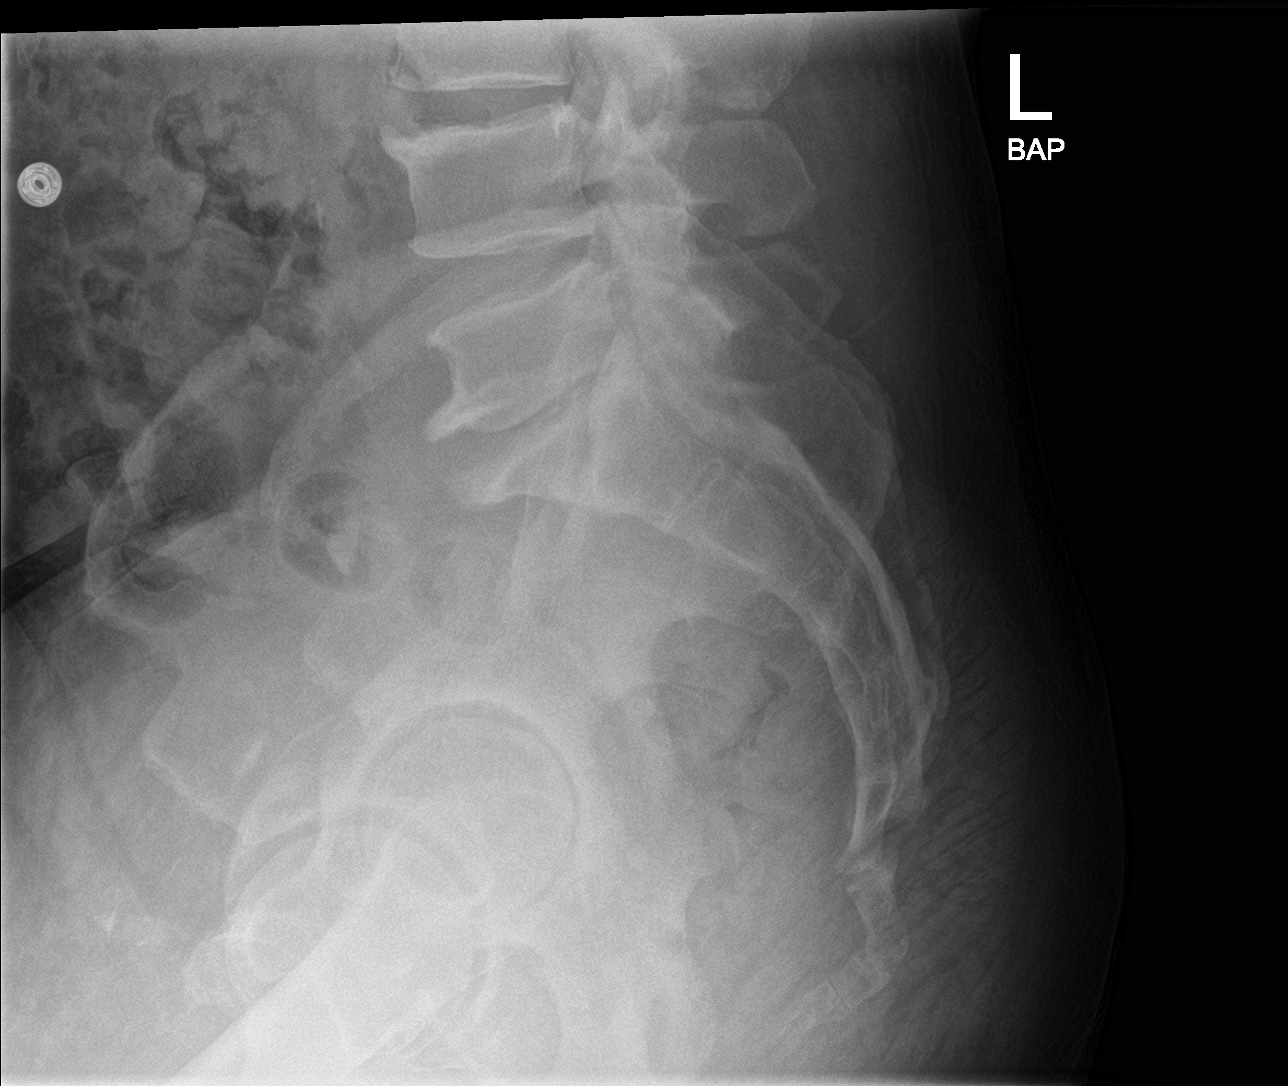

[l-spine ap]
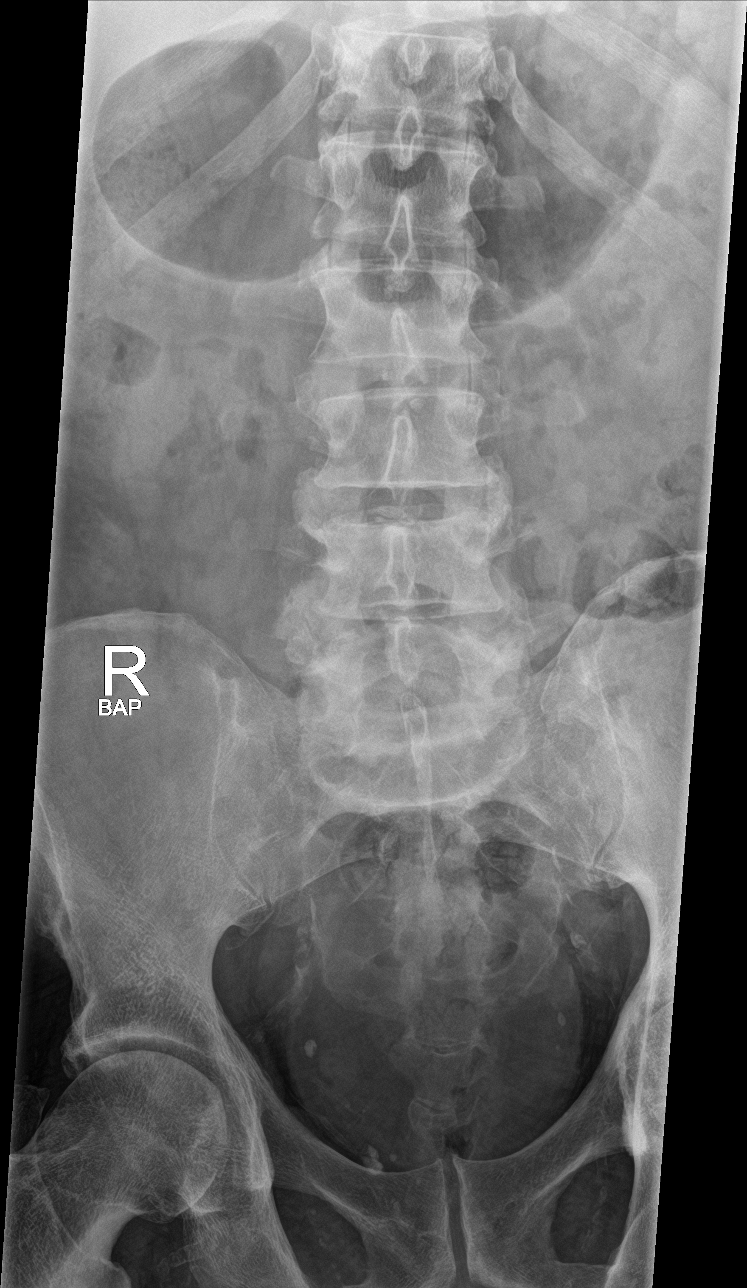

[5 of 5 positions shown; findings below may reference images not displayed]

FINDINGS: No fracture or spondylolisthesis is noted. Mild degenerative disc
disease is noted at L3-4 with anterior osteophyte formation.
Moderate degenerative disc disease is noted at L5-S1 with anterior
osteophyte formation.
IMPRESSION: Multilevel degenerative disc disease. No acute abnormality seen in
the lumbar spine.

## 2020-11-07 ENCOUNTER — Telehealth: Payer: Self-pay

## 2020-11-07 NOTE — Telephone Encounter (Signed)
Pt nebulizer is working; pls contact [phone]

## 2020-11-08 NOTE — Telephone Encounter (Signed)
I returned phone call back to patient about his Nebulizer machine. The problem is there is no mist coming out of the machine.The patient said this is the second machine he has had that has done the same thing. I asked him what company did he get the machine from, he has gotten both machines from a perscription written by his doctor and both machines were done through his [employer]. I told him to call [facility] and let them no and see what they could for him. The patient said he will call us back.

## 2020-11-08 NOTE — Telephone Encounter (Signed)
Requesting to speak with a nurse about nebulizer. Please call pt back.  

## 2020-11-09 NOTE — Telephone Encounter (Signed)
   GM     2        Keath Matera Male, 60 y.o., 08-26-1961  MRN:  [id number] Phone:  [phone] Jerilynn Mages)       PCP:  [name], DO Coverage:  [facility] [name], DO) 12/01/2020 at 9:45 AM          RE: nebulizer problem Received: Today Babette Relic, [place], [place]; [place], [place]; [place], Leory Plowman    patient needs to bring nebulizer to local retail store for service/exchange        Previous Messages   ----- Message -----  From: Skeet Latch  Sent: 11/09/2020 10:03 AM EST  To: Darlina Guys, Jaquita Rector, *  Subject: RE: nebulizer problem                 we will try and replace   ----- Message -----  From: Judge Stall, NT  Sent: 11/08/2020  5:33 PM EST  To: Clifton James  Subject: nebulizer problem                 This patient is having trouble with his machine.The patient said this is the second time he can not get the mist to come out. I tjink he got this machine in October 2021. Can you see if you can help with this problem [place], [place] C2/1/20225:33 PM

## 2020-11-10 ENCOUNTER — Encounter: Payer: Self-pay | Admitting: Physician Assistant

## 2020-11-10 ENCOUNTER — Ambulatory Visit (INDEPENDENT_AMBULATORY_CARE_PROVIDER_SITE_OTHER): Payer: Medicare Other | Admitting: Orthopedic Surgery

## 2020-11-10 VITALS — Ht 75.0 in | Wt 234.0 lb

## 2020-11-10 DIAGNOSIS — T8781 Dehiscence of amputation stump: Secondary | ICD-10-CM

## 2020-11-14 ENCOUNTER — Telehealth: Payer: Self-pay

## 2020-11-14 DIAGNOSIS — J449 Chronic obstructive pulmonary disease, unspecified: Secondary | ICD-10-CM

## 2020-11-14 NOTE — Telephone Encounter (Signed)
Please call pt back about nebulizer.

## 2020-11-15 NOTE — Telephone Encounter (Signed)
Call from pt stating he has not received nebulizer replacement and continues to have problems with the one he has. States he's having more trouble breathing and using his inhaler more than usual. I will ask Mamie to f/u. Thanks

## 2020-11-15 NOTE — Telephone Encounter (Signed)
I received message from [facility] for patient to bring his nebulizer machine to the retail store for service or exchange I called patient to let him no and he is going to take machine there. I ask that the patient follow up with me if there are any more issues patient understood Kourtnie Sachs C2/8/202210:10 AM

## 2020-11-16 NOTE — Addendum Note (Signed)
Addended by: [name] C on: 11/16/2020 09:30 AM   Modules accepted: Orders

## 2020-11-16 NOTE — Telephone Encounter (Addendum)
Rx for nebulizer machine faxed to [facility] at [phone]. Fax confirmation receipt received. Patient notified and is very Patent attorney. Hubbard Hartshorn, BSN, RN-BC

## 2020-11-16 NOTE — Telephone Encounter (Signed)
Patient called in from [facility]. States he brought his compressor in requesting they fix it. Unfortunately, they do not fix them. He does not want to go through [employer]. He is requesting a Rx for a new nebulizer/compressor be faxed to [facility] at [phone]. Hubbard Hartshorn, BSN, RN-BC

## 2020-11-17 ENCOUNTER — Encounter: Payer: Self-pay | Admitting: Physician Assistant
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
## 2020-11-22 ENCOUNTER — Encounter: Payer: Self-pay | Admitting: Vascular Surgery

## 2020-11-22 ENCOUNTER — Other Ambulatory Visit: Payer: Self-pay

## 2020-11-22 ENCOUNTER — Ambulatory Visit (HOSPITAL_COMMUNITY)
Admission: RE | Admit: 2020-11-22 | Discharge: 2020-11-22 | Disposition: A | Discharge status: Home / Self Care | Payer: Medicare Other | Source: Ambulatory Visit | Attending: Vascular Surgery | Admitting: Vascular Surgery

## 2020-11-22 ENCOUNTER — Ambulatory Visit (INDEPENDENT_AMBULATORY_CARE_PROVIDER_SITE_OTHER): Payer: Medicare Other | Admitting: Vascular Surgery

## 2020-11-22 VITALS — BP 113/81 | HR 95 | Temp 98.2°F | Resp 20 | Ht 75.0 in | Wt 234.0 lb

## 2020-11-22 DIAGNOSIS — I70245 Atherosclerosis of native arteries of left leg with ulceration of other part of foot: Secondary | ICD-10-CM | POA: Diagnosis not present

## 2020-11-22 DIAGNOSIS — S91309S Unspecified open wound, unspecified foot, sequela: Secondary | ICD-10-CM
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
## 2020-11-22 NOTE — Telephone Encounter (Signed)
  albuterol (VENTOLIN HFA) 108 (90 Base) MCG/ACT inhaler, refill request @  [place], [place] AT [address] Phone:  [phone]  Fax:  [phone]

## 2020-11-23 ENCOUNTER — Ambulatory Visit (INDEPENDENT_AMBULATORY_CARE_PROVIDER_SITE_OTHER): Payer: Medicare Other | Admitting: Podiatry

## 2020-11-23 ENCOUNTER — Telehealth: Payer: Self-pay

## 2020-11-23 ENCOUNTER — Encounter: Payer: Self-pay | Admitting: Podiatry

## 2020-11-23 DIAGNOSIS — E1139 Type 2 diabetes mellitus with other diabetic ophthalmic complication: Secondary | ICD-10-CM

## 2020-11-23 DIAGNOSIS — M79674 Pain in right toe(s): Secondary | ICD-10-CM | POA: Diagnosis not present

## 2020-11-23 DIAGNOSIS — E1140 Type 2 diabetes mellitus with diabetic neuropathy, unspecified: Secondary | ICD-10-CM | POA: Diagnosis not present

## 2020-11-23 DIAGNOSIS — M79675 Pain in left toe(s): Secondary | ICD-10-CM

## 2020-11-23 DIAGNOSIS — E1165 Type 2 diabetes mellitus with hyperglycemia: Secondary | ICD-10-CM

## 2020-11-23 DIAGNOSIS — I739 Peripheral vascular disease, unspecified: Secondary | ICD-10-CM | POA: Diagnosis not present

## 2020-11-23 DIAGNOSIS — L84 Corns and callosities: Secondary | ICD-10-CM

## 2020-11-23 DIAGNOSIS — B351 Tinea unguium: Secondary | ICD-10-CM | POA: Diagnosis not present

## 2020-11-23 MED ORDER — ALBUTEROL SULFATE HFA 108 (90 BASE) MCG/ACT IN AERS: INHALATION_SPRAY | RESPIRATORY_TRACT | 1 refills | Status: DC

## 2020-11-23 NOTE — Telephone Encounter (Signed)
Richard [name] will recert [facility] for this pt to monitor wound care this Friday.

## 2020-11-23 NOTE — Progress Notes (Signed)
This patient returns to my office for at risk foot care.  This patient requires this care by a professional since this patient will be at risk due to having diabetes and PAD.   Patient says he had an amputation left foot which is slowly healing.  He says he will be revascularized in the next few weeks. To help surgery to heal. .  He says he just applied a bandage before coming to this office so he requests no treatment on his left foot.      This patient is unable to cut nails himself since the patient cannot reach his nails.These nails are painful walking and wearing shoes. Patient says the skin lesion under his right forefoot  has thickened.   This patient presents for at risk foot care today.  General Appearance  Alert, conversant and in no acute stress.  Vascular  Dorsalis pedis and posterior tibial  pulses are palpable  Right foot..  Capillary return is within normal limits  right. Temperature is within normal limits  bilaterally.  Neurologic  Senn-Weinstein monofilament wire test absent   bilaterally. Muscle power within normal limits bilaterally.  Nails Thick disfigured discolored nails with subungual debris  from hallux to fifth toes right and 1-3 left foot.. No evidence of bacterial infection or drainage bilaterally.  Orthopedic  No limitations of motion  feet .  No crepitus or effusions noted.  No bony pathology or digital deformities noted. Hammer toes  B/L.  Plantar flexed second met right foot. Amputation digits left foot.  Skin  Thick necrotic skin lesion sub 2 right foot.  Hemorrhagic callus noted.  No signs of infections or ulcers noted.     Onychomycosis  Pain in right toes  Pain in left toes  Consent was obtained for treatment procedures.   Mechanical debridement of nails 1-5  right  and 1-3 left foot. were  performed with a nail nipper.  Filed with dremel without incident.   Debridement of skin lesion with a # 15 blade.   Return office visit   9  weeks.                  Told  patient to return for periodic foot care and evaluation due to potential at risk complications.   Gregory Mayer DPM  

## 2020-11-29 ENCOUNTER — Other Ambulatory Visit (HOSPITAL_COMMUNITY)
Admission: RE | Admit: 2020-11-29 | Discharge: 2020-11-29 | Disposition: A | Discharge status: Home / Self Care | Payer: Medicare Other | Source: Ambulatory Visit | Attending: Vascular Surgery | Admitting: Vascular Surgery

## 2020-11-29 DIAGNOSIS — Z20822 Contact with and (suspected) exposure to covid-19: Secondary | ICD-10-CM | POA: Insufficient documentation

## 2020-11-29 DIAGNOSIS — Z01812 Encounter for preprocedural laboratory examination: Secondary | ICD-10-CM | POA: Diagnosis present

## 2020-11-29 LAB — SARS CORONAVIRUS 2 (TAT 6-24 HRS): SARS Coronavirus 2: NEGATIVE

## 2020-11-30 ENCOUNTER — Other Ambulatory Visit: Payer: Self-pay

## 2020-11-30 ENCOUNTER — Encounter (HOSPITAL_COMMUNITY): Payer: Self-pay | Admitting: Vascular Surgery

## 2020-11-30 ENCOUNTER — Encounter (HOSPITAL_COMMUNITY)
Admission: RE | Disposition: A | Discharge status: Home / Self Care | Payer: Self-pay | Source: Home / Self Care | Attending: Vascular Surgery

## 2020-11-30 ENCOUNTER — Ambulatory Visit (HOSPITAL_COMMUNITY)
Admission: RE | Admit: 2020-11-30 | Discharge: 2020-11-30 | Disposition: A | Discharge status: Home / Self Care | Payer: Medicare Other | Attending: Vascular Surgery | Admitting: Vascular Surgery

## 2020-11-30 DIAGNOSIS — Z7984 Long term (current) use of oral hypoglycemic drugs: Secondary | ICD-10-CM | POA: Insufficient documentation

## 2020-11-30 DIAGNOSIS — E1151 Type 2 diabetes mellitus with diabetic peripheral angiopathy without gangrene: Secondary | ICD-10-CM | POA: Diagnosis present

## 2020-11-30 DIAGNOSIS — Z79899 Other long term (current) drug therapy: Secondary | ICD-10-CM | POA: Diagnosis not present

## 2020-11-30 DIAGNOSIS — Z87891 Personal history of nicotine dependence: Secondary | ICD-10-CM | POA: Insufficient documentation

## 2020-11-30 DIAGNOSIS — Z7982 Long term (current) use of aspirin: Secondary | ICD-10-CM | POA: Diagnosis not present

## 2020-11-30 DIAGNOSIS — T8781 Dehiscence of amputation stump: Secondary | ICD-10-CM | POA: Diagnosis not present

## 2020-11-30 HISTORY — PX: ABDOMINAL AORTOGRAM W/LOWER EXTREMITY: CATH118223

## 2020-11-30 LAB — POCT I-STAT, CHEM 8
BUN: 16 mg/dL (ref 8–23)
Calcium, Ion: 1.21 mmol/L (ref 1.15–1.40)
Chloride: 106 mmol/L (ref 98–111)
Creatinine, Ser: 0.8 mg/dL (ref 0.61–1.24)
Glucose, Bld: 113 mg/dL — ABNORMAL HIGH (ref 70–99)
HCT: 38 % — ABNORMAL LOW (ref 39.0–52.0)
Hemoglobin: 12.9 g/dL — ABNORMAL LOW (ref 13.0–17.0)
Potassium: 4.5 mmol/L (ref 3.5–5.1)
Sodium: 140 mmol/L (ref 135–145)
TCO2: 22 mmol/L (ref 22–32)

## 2020-11-30 LAB — GLUCOSE, CAPILLARY: Glucose-Capillary: 94 mg/dL (ref 70–99)

## 2020-11-30 SURGERY — ABDOMINAL AORTOGRAM W/LOWER EXTREMITY
Anesthesia: LOCAL

## 2020-11-30 MED ORDER — MIDAZOLAM HCL 2 MG/2ML IJ SOLN
INTRAMUSCULAR | Status: DC | PRN
  Administered 2020-11-30: 1 mg via INTRAVENOUS

## 2020-11-30 MED ORDER — SODIUM CHLORIDE 0.9 % IV SOLN: 250.0000 mL | INTRAVENOUS | Status: DC | PRN

## 2020-11-30 MED ORDER — IODIXANOL 320 MG/ML IV SOLN
INTRAVENOUS | Status: DC | PRN
  Administered 2020-11-30: 97 mL via INTRA_ARTERIAL

## 2020-11-30 MED ORDER — SODIUM CHLORIDE 0.9 % WEIGHT BASED INFUSION: 1.0000 mL/kg/h | INTRAVENOUS | Status: DC

## 2020-11-30 MED ORDER — FENTANYL CITRATE (PF) 100 MCG/2ML IJ SOLN
INTRAMUSCULAR | Status: DC | PRN
  Administered 2020-11-30: 50 ug via INTRAVENOUS

## 2020-11-30 MED ORDER — MIDAZOLAM HCL 2 MG/2ML IJ SOLN
INTRAMUSCULAR | Status: AC
  Filled 2020-11-30: qty 2

## 2020-11-30 MED ORDER — HEPARIN (PORCINE) IN NACL 1000-0.9 UT/500ML-% IV SOLN
INTRAVENOUS | Status: AC
  Filled 2020-11-30: qty 1000

## 2020-11-30 MED ORDER — SODIUM CHLORIDE 0.9% FLUSH: 3.0000 mL | INTRAVENOUS | Status: DC | PRN

## 2020-11-30 MED ORDER — HYDRALAZINE HCL 20 MG/ML IJ SOLN: 5.0000 mg | INTRAMUSCULAR | Status: DC | PRN

## 2020-11-30 MED ORDER — LABETALOL HCL 5 MG/ML IV SOLN
10.0000 mg | INTRAVENOUS | Status: DC | PRN
Start: 2020-11-30 — End: 2020-11-30

## 2020-11-30 MED ORDER — FENTANYL CITRATE (PF) 100 MCG/2ML IJ SOLN
INTRAMUSCULAR | Status: AC
  Filled 2020-11-30: qty 2

## 2020-11-30 MED ORDER — HEPARIN (PORCINE) IN NACL 1000-0.9 UT/500ML-% IV SOLN
INTRAVENOUS | Status: DC | PRN
  Administered 2020-11-30 (×2): 500 mL

## 2020-11-30 MED ORDER — ACETAMINOPHEN 325 MG PO TABS: 650.0000 mg | ORAL_TABLET | ORAL | Status: DC | PRN

## 2020-11-30 MED ORDER — SODIUM CHLORIDE 0.9% FLUSH: 3.0000 mL | Freq: Two times a day (BID) | INTRAVENOUS | Status: DC

## 2020-11-30 MED ORDER — SODIUM CHLORIDE 0.9 % IV SOLN: INTRAVENOUS | Status: DC

## 2020-11-30 MED ORDER — LIDOCAINE HCL (PF) 1 % IJ SOLN
INTRAMUSCULAR | Status: AC
  Filled 2020-11-30: qty 30

## 2020-11-30 MED ORDER — ONDANSETRON HCL 4 MG/2ML IJ SOLN: 4.0000 mg | Freq: Four times a day (QID) | INTRAMUSCULAR | Status: DC | PRN

## 2020-11-30 MED ORDER — LIDOCAINE HCL (PF) 1 % IJ SOLN
INTRAMUSCULAR | Status: DC | PRN
  Administered 2020-11-30: 15 mL via INTRADERMAL

## 2020-11-30 SURGICAL SUPPLY — 10 items
CATH OMNI FLUSH 5F 65CM (CATHETERS) ×2 IMPLANT
DEVICE CLOSURE MYNXGRIP 5F (Vascular Products) ×2 IMPLANT
KIT MICROPUNCTURE NIT STIFF (SHEATH) ×2 IMPLANT
KIT PV (KITS) ×2 IMPLANT
SHEATH PINNACLE 5F 10CM (SHEATH) ×2 IMPLANT
SHEATH PROBE COVER 6X72 (BAG) ×2 IMPLANT
SYR MEDRAD MARK V 150ML (SYRINGE) ×2 IMPLANT
TRANSDUCER W/STOPCOCK (MISCELLANEOUS) ×2 IMPLANT
TRAY PV CATH (CUSTOM PROCEDURE TRAY) ×2 IMPLANT
WIRE BENTSON .035X145CM (WIRE) ×2 IMPLANT

## 2020-11-30 NOTE — Op Note (Addendum)
DATE OF SERVICE: 11/30/2020  PATIENT:  Richard [name]  60 y.o. male  PRE-OPERATIVE DIAGNOSIS:  Poorly healing L 4/5 ray amputation  POST-OPERATIVE DIAGNOSIS:  Same  PROCEDURE:   1) US guided R CFA access 2) Introduction of catheter into aorta 3) Aortogram 4) Bilateral lower extremity angiogram (32mL) 5) Conscious sedation (24 minutes)  SURGEON:  Surgeon(s) and Role:    * Richard Robins, MD - Primary  ASSISTANT: none  ANESTHESIA:   local and IV sedation  EBL: min  BLOOD ADMINISTERED:none  DRAINS: none   LOCAL MEDICATIONS USED:  LIDOCAINE   SPECIMEN:  none  COUNTS: confirmed correct.  TOURNIQUET:  None  PATIENT DISPOSITION:  PACU - hemodynamically stable.   Delay start of Pharmacological VTE agent (>24hrs) due to surgical blood loss or risk of bleeding: no  INDICATION FOR PROCEDURE: Richard [name] is a 60 y.o. male with DM and a poorly healing left fourth / fifth toe amputation. He had no palpable pedal pulses. His non-invasive studies suggested adequate flow to the foot. He was offered angiogram to confirm non-invasive findings given long course of difficult wound healing. We specifically discussed access site complications. The patient understood and wished to proceed.  OPERATIVE FINDINGS: Unremarkable aortogram and bilateral lower extremity angiogram. No evidence of hemodynamically significant stenosis. Pedal disease noted in bilateral feet.   DESCRIPTION OF PROCEDURE: After identification of the patient in the pre-operative holding area, the patient was transferred to the operating room. The patient was positioned supine on the operating room table. Anesthesia was induced. The groins was prepped and draped in standard fashion. A surgical pause was performed confirming correct patient, procedure, and operative location.  The right groin was anesthetized with subcutaneous injection of 1% lidocaine. Using ultrasound guidance, the right common femoral artery was  accessed with micropuncture technique. Fluoroscopy was used to confirm cannulation over the femoral head. Sheathogram was not performed. The 73F sheath was upsized to 62F.   An 035 [name] wire was advanced into the distal aorta. Over the wire an omni flush catheter was advanced to the level of L2. Aortogram and bilateral lower extremity runoff angiogram was performed - see above for details.   A mynx device was used to close the arteriotomy. Hemostasis was excellent upon completion.  Upon completion of the case instrument and sharps counts were confirmed correct. The patient was transferred to the PACU in good condition. I was present for all portions of the procedure.  Conscious sedation was administered with the use of IV fentanyl and midazolam under continuous physician and nurse monitoring.  Heart rate, blood pressure, and oxygen saturation were continuously monitored.  Total sedation time was 24 minutes  Yevonne Aline. [name], MD [facility] Phone Number: [phone] 11/30/2020 9:20 AM

## 2020-11-30 NOTE — Interval H&P Note (Signed)
History and Physical Interval Note:  11/30/2020 8:22 AM  Richard [name]  has presented today for surgery, with the diagnosis of peripheral vascular disease.  The various methods of treatment have been discussed with the patient and family. After consideration of risks, benefits and other options for treatment, the patient has consented to  Procedure(s): ABDOMINAL AORTOGRAM W/LOWER EXTREMITY (N/A) as a surgical intervention.  The patient's history has been reviewed, patient examined, no change in status, stable for surgery.  I have reviewed the patient's chart and labs.  Questions were answered to the patient's satisfaction.     [name]

## 2020-11-30 NOTE — Discharge Instructions (Signed)
Femoral Site Care This sheet gives you information about how to care for yourself after your procedure. Your health care provider may also give you more specific instructions. If you have problems or questions, contact your health care provider. What can I expect after the procedure?  After the procedure, it is common to have:  Bruising that usually fades within 1-2 weeks.  Tenderness at the site. Follow these instructions at home: Wound care 1. Follow instructions from your health care provider about how to take care of your insertion site. Make sure you: ? Wash your hands with soap and water before you change your bandage (dressing). If soap and water are not available, use hand sanitizer. ? Remove your dressing as told by your health care provider. 2. Do not take baths, swim, or use a hot tub until your health care provider approves. 3. You may shower 24-48 hours after the procedure or as told by your health care provider. ? Gently wash the site with plain soap and water. ? Pat the area dry with a clean towel. ? Do not rub the site. This may cause bleeding. 4. Do not apply powder or lotion to the site. Keep the site clean and dry. 5. Check your femoral site every day for signs of infection. Check for: ? Redness, swelling, or pain. ? Fluid or blood. ? Warmth. ? Pus or a bad smell. Activity 1. For the first 2-3 days after your procedure, or as long as directed: ? Avoid climbing stairs as much as possible. ? Do not squat. 2. Do not lift anything that is heavier than 10 lb (4.5 kg), or the limit that you are told, until your health care provider says that it is safe. For 5 days 3. Rest as directed. ? Avoid sitting for a long time without moving. Get up to take short walks every 1-2 hours. 4. Do not drive for 24 hours if you were given a medicine to help you relax (sedative). General instructions  Take over-the-counter and prescription medicines only as told by your health care  provider.  Keep all follow-up visits as told by your health care provider. This is important. Contact a health care provider if you have:  A fever or chills.  You have redness, swelling, or pain around your insertion site. Get help right away if:  The catheter insertion area swells very fast.  You pass out.  You suddenly start to sweat or your skin gets clammy.  The catheter insertion area is bleeding, and the bleeding does not stop when you hold steady pressure on the area.  The area near or just beyond the catheter insertion site becomes pale, cool, tingly, or numb. These symptoms may represent a serious problem that is an emergency. Do not wait to see if the symptoms will go away. Get medical help right away. Call your local emergency services (911 in the [place] Do not drive yourself to the hospital. Summary  After the procedure, it is common to have bruising that usually fades within 1-2 weeks.  Check your femoral site every day for signs of infection.  Do not lift anything that is heavier than 10 lb (4.5 kg), or the limit that you are told, until your health care provider says that it is safe. This information is not intended to replace advice given to you by your health care provider. Make sure you discuss any questions you have with your health care provider. Document Revised: 10/07/2017 Document Reviewed: 10/07/2017 Elsevier Patient Education  [place].

## 2020-12-01 ENCOUNTER — Ambulatory Visit (INDEPENDENT_AMBULATORY_CARE_PROVIDER_SITE_OTHER): Payer: Medicare Other | Admitting: Infectious Disease

## 2020-12-01 ENCOUNTER — Encounter: Payer: Self-pay | Admitting: Infectious Disease

## 2020-12-01 ENCOUNTER — Ambulatory Visit: Payer: Medicare Other | Admitting: Orthopedic Surgery

## 2020-12-01 ENCOUNTER — Other Ambulatory Visit: Payer: Self-pay

## 2020-12-01 ENCOUNTER — Encounter: Payer: Self-pay | Admitting: Orthopedic Surgery

## 2020-12-01 ENCOUNTER — Encounter: Payer: Self-pay | Admitting: Internal Medicine

## 2020-12-01 ENCOUNTER — Ambulatory Visit (INDEPENDENT_AMBULATORY_CARE_PROVIDER_SITE_OTHER): Payer: Medicare Other | Admitting: Internal Medicine

## 2020-12-01 ENCOUNTER — Ambulatory Visit (INDEPENDENT_AMBULATORY_CARE_PROVIDER_SITE_OTHER): Payer: Medicare Other | Admitting: Physician Assistant

## 2020-12-01 VITALS — BP 124/75 | HR 92 | Temp 98.4°F | Ht 74.0 in | Wt 235.8 lb

## 2020-12-01 VITALS — BP 125/83 | HR 98 | Temp 98.6°F | Ht 74.0 in | Wt 234.0 lb

## 2020-12-01 DIAGNOSIS — M869 Osteomyelitis, unspecified: Secondary | ICD-10-CM

## 2020-12-01 DIAGNOSIS — E1140 Type 2 diabetes mellitus with diabetic neuropathy, unspecified: Secondary | ICD-10-CM | POA: Diagnosis not present

## 2020-12-01 DIAGNOSIS — I2510 Atherosclerotic heart disease of native coronary artery without angina pectoris: Secondary | ICD-10-CM | POA: Diagnosis not present

## 2020-12-01 DIAGNOSIS — E1151 Type 2 diabetes mellitus with diabetic peripheral angiopathy without gangrene: Secondary | ICD-10-CM | POA: Diagnosis not present

## 2020-12-01 DIAGNOSIS — I700 Atherosclerosis of aorta: Secondary | ICD-10-CM | POA: Diagnosis not present

## 2020-12-01 DIAGNOSIS — J449 Chronic obstructive pulmonary disease, unspecified: Secondary | ICD-10-CM | POA: Diagnosis not present

## 2020-12-01 DIAGNOSIS — I739 Peripheral vascular disease, unspecified: Secondary | ICD-10-CM | POA: Diagnosis not present

## 2020-12-01 MED ORDER — DULERA 200-5 MCG/ACT IN AERO: 2.0000 | INHALATION_SPRAY | Freq: Two times a day (BID) | RESPIRATORY_TRACT | 3 refills | Status: DC

## 2020-12-01 MED ORDER — ATORVASTATIN CALCIUM 40 MG PO TABS: 40.0000 mg | ORAL_TABLET | Freq: Every day | ORAL | 3 refills | Status: DC

## 2020-12-01 MED ORDER — PIOGLITAZONE HCL 30 MG PO TABS: 30.0000 mg | ORAL_TABLET | Freq: Every day | ORAL | 3 refills | Status: DC
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
## 2020-12-02 LAB — CBC WITH DIFFERENTIAL/PLATELET
Absolute Monocytes: 525 cells/uL (ref 200–950)
Basophils Absolute: 41 cells/uL (ref 0–200)
Basophils Relative: 0.8 %
Eosinophils Absolute: 505 cells/uL — ABNORMAL HIGH (ref 15–500)
Eosinophils Relative: 9.9 %
HCT: 38 % — ABNORMAL LOW (ref 38.5–50.0)
Hemoglobin: 12.3 g/dL — ABNORMAL LOW (ref 13.2–17.1)
Lymphs Abs: 836 cells/uL — ABNORMAL LOW (ref 850–3900)
MCH: 25.6 pg — ABNORMAL LOW (ref 27.0–33.0)
MCHC: 32.4 g/dL (ref 32.0–36.0)
MCV: 79.2 fL — ABNORMAL LOW (ref 80.0–100.0)
MPV: 9.8 fL (ref 7.5–12.5)
Monocytes Relative: 10.3 %
Neutro Abs: 3193 cells/uL (ref 1500–7800)
Neutrophils Relative %: 62.6 %
Platelets: 304 10*3/uL (ref 140–400)
RBC: 4.8 10*6/uL (ref 4.20–5.80)
RDW: 13.7 % (ref 11.0–15.0)
Total Lymphocyte: 16.4 %
WBC: 5.1 10*3/uL (ref 3.8–10.8)

## 2020-12-02 LAB — BASIC METABOLIC PANEL WITH GFR
BUN: 13 mg/dL (ref 7–25)
CO2: 28 mmol/L (ref 20–32)
Calcium: 9.5 mg/dL (ref 8.6–10.3)
Chloride: 103 mmol/L (ref 98–110)
Creat: 0.88 mg/dL (ref 0.70–1.25)
GFR, Est African American: 107 mL/min/{1.73_m2} (ref >=60)
GFR, Est Non African American: 93 mL/min/{1.73_m2} (ref >=60)
Glucose, Bld: 121 mg/dL — ABNORMAL HIGH (ref 65–99)
Potassium: 4.8 mmol/L (ref 3.5–5.3)
Sodium: 138 mmol/L (ref 135–146)

## 2020-12-02 LAB — SEDIMENTATION RATE: Sed Rate: 29 mm/h — ABNORMAL HIGH (ref 0–20)

## 2020-12-02 LAB — C-REACTIVE PROTEIN: CRP: 10 mg/L — ABNORMAL HIGH (ref <8.0)

## 2020-12-02 NOTE — Assessment & Plan Note (Signed)
HPI: Overall doing well, has some mild end expiratory wheezing on exam he reports he figures that he needs a refill of Dulera.  Assessment asthma COPD overlap  Plan Refilled Dulera continue Singulair and albuterol as needed.

## 2020-12-02 NOTE — Progress Notes (Signed)
  Subjective:  HPI: [name] is a 60 y.o. male who presents for f/u DM  Please see Assessment and Plan below for the status of his chronic medical problems.  Objective:  Physical Exam: Vitals:   12/01/20 1001  BP: 124/75  Pulse: 92  Temp: 98.4 F (36.9 C)  TempSrc: Oral  SpO2: 98%  Weight: 235 lb 12.8 oz (107 kg)  Height: 6\' 2"  (1.88 m)   Body mass index is 30.27 kg/m. Physical Exam Vitals and nursing note reviewed.  Constitutional:      Appearance: Normal appearance.  Cardiovascular:     Rate and Rhythm: Normal rate and regular rhythm.  Pulmonary:     Effort: Pulmonary effort is normal.     Breath sounds: Wheezing (mild end expiratory) present.  Musculoskeletal:     Comments: Left foot in boot.  Skin:    Comments: Right groin bandage removed, no bleeding bruising or erythema  Neurological:     Mental Status: He is alert.    Assessment & Plan:  See Encounters Tab for problem based charting.  Medications Ordered Meds ordered this encounter  Medications  . atorvastatin (LIPITOR) 40 MG tablet    Sig: Take 1 tablet (40 mg total) by mouth daily.    Dispense:  90 tablet    Refill:  3  . mometasone-formoterol (DULERA) 200-5 MCG/ACT AERO    Sig: Inhale 2 puffs into the lungs 2 (two) times daily.    Dispense:  13 g    Refill:  3  . pioglitazone (ACTOS) 30 MG tablet    Sig: Take 1 tablet (30 mg total) by mouth daily.    Dispense:  90 tablet    Refill:  3    Place on file.   Other Orders No orders of the defined types were placed in this encounter.  Follow Up: Return in about 3 months (around 02/28/2021).

## 2020-12-02 NOTE — Assessment & Plan Note (Addendum)
HPI: He has been taking Synjardy, Trulicity and Actos as directed.  Last A1c a month and a half ago was controlled at 6.7%.  He has been following closely with infectious disease orthopedics and vascular surgery for healing of his left foot wound.  Overall feels his sugars been doing well.  Assessment controlled type 2 diabetes with PAD  Plan No adjustments made to his current medications provided him encouragement that he is doing a good job controlling his sugars in the setting of his slow healing wound/amputation.

## 2020-12-02 NOTE — Assessment & Plan Note (Signed)
HPI: Recently had aortogram and runoff by Dr. [name] with no major obstruction.  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  Assessment PAD  Plan Continue statin aspirin once able to exercise program will be beneficial.

## 2020-12-05 ENCOUNTER — Other Ambulatory Visit: Payer: Self-pay | Admitting: Physician Assistant

## 2020-12-06 ENCOUNTER — Other Ambulatory Visit: Payer: Self-pay | Admitting: Infectious Disease

## 2020-12-06 NOTE — Telephone Encounter (Signed)
Called patient regarding refill request. Patient is still taking Doxy but would like to finish remaining tablets before stopping. Patient will call office he has any concerns once off.

## 2020-12-06 NOTE — Telephone Encounter (Signed)
Please advise on refill.

## 2020-12-06 NOTE — Telephone Encounter (Signed)
Ok but would not send refill in meantime thx!

## 2020-12-06 NOTE — Telephone Encounter (Signed)
We are going to watch him OFF antibiotics unless something has changed on his end since I sawh im last week

## 2020-12-07 ENCOUNTER — Ambulatory Visit (INDEPENDENT_AMBULATORY_CARE_PROVIDER_SITE_OTHER): Payer: Medicare Other | Admitting: Physician Assistant

## 2020-12-07 ENCOUNTER — Other Ambulatory Visit: Payer: Self-pay

## 2020-12-07 ENCOUNTER — Ambulatory Visit (INDEPENDENT_AMBULATORY_CARE_PROVIDER_SITE_OTHER): Payer: Medicare Other

## 2020-12-07 ENCOUNTER — Encounter (HOSPITAL_COMMUNITY): Payer: Self-pay | Admitting: Emergency Medicine

## 2020-12-07 ENCOUNTER — Emergency Department (HOSPITAL_COMMUNITY): Payer: Medicare Other

## 2020-12-07 ENCOUNTER — Encounter: Payer: Self-pay | Admitting: Physician Assistant

## 2020-12-07 ENCOUNTER — Emergency Department (HOSPITAL_COMMUNITY)
Admission: EM | Admit: 2020-12-07 | Discharge: 2020-12-08 | Disposition: A | Discharge status: Home / Self Care | Payer: Medicare Other | Attending: Emergency Medicine | Admitting: Emergency Medicine

## 2020-12-07 DIAGNOSIS — I2510 Atherosclerotic heart disease of native coronary artery without angina pectoris: Secondary | ICD-10-CM | POA: Diagnosis present

## 2020-12-07 DIAGNOSIS — M869 Osteomyelitis, unspecified: Secondary | ICD-10-CM

## 2020-12-07 DIAGNOSIS — Z87891 Personal history of nicotine dependence: Secondary | ICD-10-CM | POA: Insufficient documentation

## 2020-12-07 DIAGNOSIS — L089 Local infection of the skin and subcutaneous tissue, unspecified: Secondary | ICD-10-CM

## 2020-12-07 DIAGNOSIS — J45909 Unspecified asthma, uncomplicated: Secondary | ICD-10-CM | POA: Insufficient documentation

## 2020-12-07 DIAGNOSIS — I119 Hypertensive heart disease without heart failure: Secondary | ICD-10-CM | POA: Insufficient documentation

## 2020-12-07 DIAGNOSIS — E1151 Type 2 diabetes mellitus with diabetic peripheral angiopathy without gangrene: Secondary | ICD-10-CM | POA: Diagnosis present

## 2020-12-07 DIAGNOSIS — Z20822 Contact with and (suspected) exposure to covid-19: Secondary | ICD-10-CM | POA: Insufficient documentation

## 2020-12-07 DIAGNOSIS — E1169 Type 2 diabetes mellitus with other specified complication: Secondary | ICD-10-CM

## 2020-12-07 DIAGNOSIS — Z7984 Long term (current) use of oral hypoglycemic drugs: Secondary | ICD-10-CM | POA: Insufficient documentation

## 2020-12-07 DIAGNOSIS — M86172 Other acute osteomyelitis, left ankle and foot: Secondary | ICD-10-CM | POA: Diagnosis not present

## 2020-12-07 DIAGNOSIS — Z7982 Long term (current) use of aspirin: Secondary | ICD-10-CM | POA: Diagnosis not present

## 2020-12-07 DIAGNOSIS — J449 Chronic obstructive pulmonary disease, unspecified: Secondary | ICD-10-CM | POA: Insufficient documentation

## 2020-12-07 DIAGNOSIS — J4489 Other specified chronic obstructive pulmonary disease: Secondary | ICD-10-CM | POA: Diagnosis present

## 2020-12-07 DIAGNOSIS — I10 Essential (primary) hypertension: Secondary | ICD-10-CM

## 2020-12-07 DIAGNOSIS — E1140 Type 2 diabetes mellitus with diabetic neuropathy, unspecified: Secondary | ICD-10-CM | POA: Insufficient documentation

## 2020-12-07 DIAGNOSIS — M79672 Pain in left foot: Secondary | ICD-10-CM | POA: Diagnosis present

## 2020-12-07 DIAGNOSIS — E1165 Type 2 diabetes mellitus with hyperglycemia: Secondary | ICD-10-CM | POA: Diagnosis present

## 2020-12-07 HISTORY — DX: Osteomyelitis, unspecified: M86.9

## 2020-12-07 HISTORY — DX: Type 2 diabetes mellitus with other specified complication: E11.69

## 2020-12-07 LAB — CBC WITH DIFFERENTIAL/PLATELET
Abs Immature Granulocytes: 0.04 10*3/uL (ref 0.00–0.07)
Basophils Absolute: 0.1 10*3/uL (ref 0.0–0.1)
Basophils Relative: 1 %
Eosinophils Absolute: 0.6 10*3/uL — ABNORMAL HIGH (ref 0.0–0.5)
Eosinophils Relative: 11 %
HCT: 38.3 % — ABNORMAL LOW (ref 39.0–52.0)
Hemoglobin: 12 g/dL — ABNORMAL LOW (ref 13.0–17.0)
Immature Granulocytes: 1 %
Lymphocytes Relative: 22 %
Lymphs Abs: 1.1 10*3/uL (ref 0.7–4.0)
MCH: 25.6 pg — ABNORMAL LOW (ref 26.0–34.0)
MCHC: 31.3 g/dL (ref 30.0–36.0)
MCV: 81.8 fL (ref 80.0–100.0)
Monocytes Absolute: 0.5 10*3/uL (ref 0.1–1.0)
Monocytes Relative: 10 %
Neutro Abs: 2.8 10*3/uL (ref 1.7–7.7)
Neutrophils Relative %: 55 %
Platelets: 315 10*3/uL (ref 150–400)
RBC: 4.68 MIL/uL (ref 4.22–5.81)
RDW: 14.5 % (ref 11.5–15.5)
WBC: 5.2 10*3/uL (ref 4.0–10.5)
nRBC: 0 % (ref 0.0–0.2)

## 2020-12-07 LAB — BASIC METABOLIC PANEL
Anion gap: 9 (ref 5–15)
BUN: 17 mg/dL (ref 8–23)
CO2: 23 mmol/L (ref 22–32)
Calcium: 9.1 mg/dL (ref 8.9–10.3)
Chloride: 104 mmol/L (ref 98–111)
Creatinine, Ser: 0.96 mg/dL (ref 0.61–1.24)
GFR, Estimated: >60 mL/min (ref >60)
Glucose, Bld: 86 mg/dL (ref 70–99)
Potassium: 4.3 mmol/L (ref 3.5–5.1)
Sodium: 136 mmol/L (ref 135–145)

## 2020-12-07 LAB — C-REACTIVE PROTEIN: CRP: 0.6 mg/dL (ref <1.0)

## 2020-12-07 LAB — SEDIMENTATION RATE: Sed Rate: 19 mm/hr — ABNORMAL HIGH (ref 0–16)

## 2020-12-07 MED ORDER — MORPHINE SULFATE (PF) 2 MG/ML IV SOLN
2.0000 mg | INTRAVENOUS | Status: DC | PRN
  Administered 2020-12-07 – 2020-12-08 (×2): 4 mg via INTRAVENOUS
  Filled 2020-12-07 (×2): qty 2

## 2020-12-07 MED ORDER — MORPHINE SULFATE (PF) 4 MG/ML IV SOLN
4.0000 mg | Freq: Once | INTRAVENOUS | Status: AC
  Administered 2020-12-07: 4 mg via INTRAVENOUS
  Filled 2020-12-07: qty 1

## 2020-12-07 MED ORDER — PIPERACILLIN-TAZOBACTAM 3.375 G IVPB 30 MIN
3.3750 g | Freq: Once | INTRAVENOUS | Status: AC
  Administered 2020-12-07: 3.375 g via INTRAVENOUS
  Filled 2020-12-07: qty 50

## 2020-12-07 MED ORDER — INSULIN ASPART 100 UNIT/ML ~~LOC~~ SOLN
0.0000 [IU] | SUBCUTANEOUS | Status: DC
  Filled 2020-12-07: qty 0.09

## 2020-12-07 MED ORDER — VANCOMYCIN HCL 2000 MG/400ML IV SOLN
2000.0000 mg | Freq: Once | INTRAVENOUS | Status: AC
  Administered 2020-12-07: 2000 mg via INTRAVENOUS
  Filled 2020-12-07: qty 400
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
## 2020-12-07 NOTE — ED Notes (Signed)
Pt given sandwich and drink.

## 2020-12-07 NOTE — ED Triage Notes (Signed)
Per EMS-had surgery to remove toes on left foot 4 months ago-was seen by ortho today-states severe pain-100 mcg of Fentanyl given in route
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
## 2020-12-08 ENCOUNTER — Other Ambulatory Visit: Payer: Self-pay

## 2020-12-08 DIAGNOSIS — L03116 Cellulitis of left lower limb: Secondary | ICD-10-CM | POA: Insufficient documentation

## 2020-12-08 DIAGNOSIS — M86172 Other acute osteomyelitis, left ankle and foot: Secondary | ICD-10-CM | POA: Diagnosis not present

## 2020-12-08 DIAGNOSIS — M869 Osteomyelitis, unspecified: Secondary | ICD-10-CM | POA: Diagnosis not present

## 2020-12-08 LAB — CBG MONITORING, ED
Glucose-Capillary: 123 mg/dL — ABNORMAL HIGH (ref 70–99)
Glucose-Capillary: 112 mg/dL — ABNORMAL HIGH (ref 70–99)
Glucose-Capillary: 124 mg/dL — ABNORMAL HIGH (ref 70–99)

## 2020-12-08 LAB — CBC WITH DIFFERENTIAL/PLATELET
Abs Immature Granulocytes: 0.03 10*3/uL (ref 0.00–0.07)
Basophils Absolute: 0.1 10*3/uL (ref 0.0–0.1)
Basophils Relative: 1 %
Eosinophils Absolute: 0.6 10*3/uL — ABNORMAL HIGH (ref 0.0–0.5)
Eosinophils Relative: 12 %
HCT: 36.6 % — ABNORMAL LOW (ref 39.0–52.0)
Hemoglobin: 11.6 g/dL — ABNORMAL LOW (ref 13.0–17.0)
Immature Granulocytes: 1 %
Lymphocytes Relative: 19 %
Lymphs Abs: 0.9 10*3/uL (ref 0.7–4.0)
MCH: 26.2 pg (ref 26.0–34.0)
MCHC: 31.7 g/dL (ref 30.0–36.0)
MCV: 82.8 fL (ref 80.0–100.0)
Monocytes Absolute: 0.6 10*3/uL (ref 0.1–1.0)
Monocytes Relative: 14 %
Neutro Abs: 2.6 10*3/uL (ref 1.7–7.7)
Neutrophils Relative %: 53 %
Platelets: 279 10*3/uL (ref 150–400)
RBC: 4.42 MIL/uL (ref 4.22–5.81)
RDW: 14.6 % (ref 11.5–15.5)
WBC: 4.7 10*3/uL (ref 4.0–10.5)
nRBC: 0 % (ref 0.0–0.2)

## 2020-12-08 LAB — COMPREHENSIVE METABOLIC PANEL
ALT: 19 U/L (ref 0–44)
AST: 21 U/L (ref 15–41)
Albumin: 3.6 g/dL (ref 3.5–5.0)
Alkaline Phosphatase: 98 U/L (ref 38–126)
Anion gap: 8 (ref 5–15)
BUN: 17 mg/dL (ref 8–23)
CO2: 22 mmol/L (ref 22–32)
Calcium: 8.8 mg/dL — ABNORMAL LOW (ref 8.9–10.3)
Chloride: 103 mmol/L (ref 98–111)
Creatinine, Ser: 0.84 mg/dL (ref 0.61–1.24)
GFR, Estimated: >60 mL/min (ref >60)
Glucose, Bld: 109 mg/dL — ABNORMAL HIGH (ref 70–99)
Potassium: 4.2 mmol/L (ref 3.5–5.1)
Sodium: 133 mmol/L — ABNORMAL LOW (ref 135–145)
Total Bilirubin: 0.7 mg/dL (ref 0.3–1.2)
Total Protein: 7.3 g/dL (ref 6.5–8.1)

## 2020-12-08 LAB — TSH: TSH: 4.897 u[IU]/mL — ABNORMAL HIGH (ref 0.350–4.500)

## 2020-12-08 LAB — HEMOGLOBIN A1C
Hgb A1c MFr Bld: 7.2 % — ABNORMAL HIGH (ref 4.8–5.6)
Mean Plasma Glucose: 159.94 mg/dL

## 2020-12-08 LAB — MRSA PCR SCREENING: MRSA by PCR: NEGATIVE

## 2020-12-08 LAB — HIV ANTIBODY (ROUTINE TESTING W REFLEX): HIV Screen 4th Generation wRfx: NONREACTIVE

## 2020-12-08 LAB — PREALBUMIN: Prealbumin: 23 mg/dL (ref 18–38)

## 2020-12-08 LAB — SARS CORONAVIRUS 2 (TAT 6-24 HRS): SARS Coronavirus 2: NEGATIVE

## 2020-12-08 LAB — PHOSPHORUS: Phosphorus: 5 mg/dL — ABNORMAL HIGH (ref 2.5–4.6)

## 2020-12-08 LAB — MAGNESIUM: Magnesium: 1.9 mg/dL (ref 1.7–2.4)

## 2020-12-08 MED ORDER — MONTELUKAST SODIUM 10 MG PO TABS
10.0000 mg | ORAL_TABLET | Freq: Every day | ORAL | Status: DC
  Administered 2020-12-08: 10 mg via ORAL
  Filled 2020-12-08: qty 1

## 2020-12-08 MED ORDER — LORATADINE 10 MG PO TABS: 10.0000 mg | ORAL_TABLET | Freq: Every evening | ORAL | Status: DC

## 2020-12-08 MED ORDER — DOXYCYCLINE HYCLATE 100 MG PO TABS: 100.0000 mg | ORAL_TABLET | Freq: Two times a day (BID) | ORAL | 0 refills | Status: DC

## 2020-12-08 MED ORDER — NITROGLYCERIN 0.2 MG/HR TD PT24
0.2000 mg | MEDICATED_PATCH | Freq: Every day | TRANSDERMAL | Status: DC
  Filled 2020-12-08: qty 1

## 2020-12-08 MED ORDER — MOMETASONE FURO-FORMOTEROL FUM 200-5 MCG/ACT IN AERO
2.0000 | INHALATION_SPRAY | Freq: Two times a day (BID) | RESPIRATORY_TRACT | Status: DC
  Filled 2020-12-08: qty 8.8

## 2020-12-08 MED ORDER — ACETAMINOPHEN 650 MG RE SUPP: 650.0000 mg | Freq: Four times a day (QID) | RECTAL | Status: DC | PRN

## 2020-12-08 MED ORDER — SODIUM CHLORIDE 0.9 % IV SOLN
2.0000 g | Freq: Three times a day (TID) | INTRAVENOUS | Status: DC
  Administered 2020-12-08: 2 g via INTRAVENOUS
  Filled 2020-12-08: qty 2

## 2020-12-08 MED ORDER — METRONIDAZOLE IN NACL 5-0.79 MG/ML-% IV SOLN
500.0000 mg | Freq: Three times a day (TID) | INTRAVENOUS | Status: DC
  Administered 2020-12-08: 500 mg via INTRAVENOUS
  Filled 2020-12-08: qty 100

## 2020-12-08 MED ORDER — LEVOCETIRIZINE DIHYDROCHLORIDE 5 MG PO TABS: 5.0000 mg | ORAL_TABLET | Freq: Every evening | ORAL | Status: DC

## 2020-12-08 MED ORDER — ATORVASTATIN CALCIUM 40 MG PO TABS: 40.0000 mg | ORAL_TABLET | Freq: Every day | ORAL | Status: DC

## 2020-12-08 MED ORDER — ACETAMINOPHEN 325 MG PO TABS: 650.0000 mg | ORAL_TABLET | Freq: Four times a day (QID) | ORAL | Status: DC | PRN

## 2020-12-08 MED ORDER — PREGABALIN 50 MG PO CAPS: 100.0000 mg | ORAL_CAPSULE | Freq: Three times a day (TID) | ORAL | Status: DC

## 2020-12-08 MED ORDER — HYDROCODONE-ACETAMINOPHEN 5-325 MG PO TABS
1.0000 | ORAL_TABLET | ORAL | Status: DC | PRN
  Administered 2020-12-08 (×2): 2 via ORAL
  Filled 2020-12-08 (×2): qty 2

## 2020-12-08 MED ORDER — ALBUTEROL SULFATE (2.5 MG/3ML) 0.083% IN NEBU
2.5000 mg | INHALATION_SOLUTION | Freq: Four times a day (QID) | RESPIRATORY_TRACT | Status: DC | PRN
  Administered 2020-12-08: 2.5 mg via RESPIRATORY_TRACT
  Filled 2020-12-08: qty 3

## 2020-12-08 MED ORDER — SODIUM CHLORIDE 0.9 % IV SOLN
75.0000 mL/h | INTRAVENOUS | Status: DC
  Administered 2020-12-08: 75 mL/h via INTRAVENOUS

## 2020-12-08 MED ORDER — LATANOPROST 0.005 % OP SOLN
1.0000 [drp] | Freq: Every day | OPHTHALMIC | Status: DC
  Filled 2020-12-08: qty 2.5

## 2020-12-08 MED ORDER — VANCOMYCIN HCL 1500 MG/300ML IV SOLN
1500.0000 mg | Freq: Two times a day (BID) | INTRAVENOUS | Status: DC
  Filled 2020-12-08: qty 300

## 2020-12-08 MED ORDER — PENTOXIFYLLINE ER 400 MG PO TBCR
400.0000 mg | EXTENDED_RELEASE_TABLET | Freq: Three times a day (TID) | ORAL | Status: DC
  Filled 2020-12-08: qty 1

## 2020-12-08 NOTE — Discharge Instructions (Addendum)
It was our pleasure to provide your ER care today - we hope that you feel better.  Take antibiotic as prescribed by orthopedist.   Keep foot very clean.  Follow up with orthopedist this coming Monday as arranged.   Return to ER if worse, new symptoms, fevers, increased swelling, spreading redness, worsening/severe pain, or other concern.

## 2020-12-08 NOTE — ED Notes (Addendum)
Patient is resting comfortably. He verbalized improvement after albuterol neb.  Respirations even and unlabored.

## 2020-12-08 NOTE — ED Provider Notes (Signed)
Orthopedic surgeon, Dr Sharol Given, indicates he has evaluated patient and indicates needs assistance processing discharge from ED. He indicates plan is that he has sent antibiotic rx to patients pharmacy, and that he will f/u patient in office next Monday.    Vitals normal, pt afebrile, comfortable, nad.  Patient discharged/avs processed per his doctor/orthopedist.   Return precautions provided.      Lajean Saver, MD 12/08/20 [phone]

## 2020-12-08 NOTE — Progress Notes (Signed)
Pharmacy Antibiotic Note  Richard [name] is a 60 y.o. male admitted on 12/07/2020 with osteomyelitis of left foot.Marland Kitchen  PMH significant for osteomyelitis of toe, DM, s/p left foot 4th and 5th toe amputation, h/o MRSA, CAD, HTN.  Patient received Vancomycin 2gm and Zosyn 3.375gm IV x 1 dose each in the ED.  Upon admission, Pharmacy has been consulted for Vancomycin and Cefepime  dosing.  Plan: Vancomycin 1500 mg IV Q 12 hrs. Goal AUC 400-550.; Expected AUC: 462; SCr used: 0.96 Cefepime 2gm IV q8h  Metronidazole per MD  Follow SCr  F/U culture results & sensitivities  Height: 6\' 3"  (190.5 cm) Weight: 106.6 kg (235 lb) IBW/kg (Calculated) : 84.5  Temp (24hrs), Avg:98.3 F (36.8 C), Min:97.8 F (36.6 C), Max:98.7 F (37.1 C)  Recent Labs  Lab 12/01/20 0930 12/07/20 1747  WBC 5.1 5.2  CREATININE 0.88 0.96    Estimated Creatinine Clearance: 106.6 mL/min (by C-G formula based on SCr of 0.96 mg/dL).    No Known Allergies  Antimicrobials this admission: 3/2 Zosyn x 1 3/2 Vanc >>   3/3 Cefepime >> 3/3 Metronidazole >>  Dose adjustments this admission:    Microbiology results: 3/2 BCx:    Thank you for allowing pharmacy to be a part of this patient's care.  Everette Rank, PharmD 12/08/2020 1:41 AM

## 2020-12-08 NOTE — Consult Note (Signed)
WOC consult requested piror to ortho team involvement.  Dr Sharol Given of the ortho service has seen the patient and provided topical treatment recommendations.  No further role for WOC team. Please re-consult if further assistance is needed.  Thank-you,  Julien Girt MSN, [place], [employer], [place], [place]

## 2020-12-08 NOTE — ED Notes (Signed)
Patient is resting comfortably. 
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
## 2020-12-08 NOTE — Progress Notes (Signed)
PT Cancellation Note  Patient Details Name: Richard [name] MRN: [id number] DOB: 1961/09/02   Cancelled Treatment:    Reason Eval/Treat Not Completed: Medical issues which prohibited therapy, to be seen by Dr. Sharol Given, ? May have surgery. Will check back later    Claretha Cooper 12/08/2020, 7:12 AM [place] Pager [phone] Office [phone]

## 2020-12-09 ENCOUNTER — Telehealth: Payer: Self-pay | Admitting: *Deleted

## 2020-12-09 NOTE — Telephone Encounter (Signed)
Transition Care Management Follow-up Telephone Call  Date of discharge and from where: 12/08/2020 -  [facility]  How have you been since you were released from the hospital? "Doing okay"  Any questions or concerns? No  Items Reviewed:  Did the pt receive and understand the discharge instructions provided? Yes   Medications obtained and verified? Yes   Other? No   Any new allergies since your discharge? No   Dietary orders reviewed? Yes  Do you have support at home? Yes   Home Care and Equipment/Supplies: Were home health services ordered? not applicable If so, what is the name of the agency? N/A  Has the agency set up a time to come to the patient's home? not applicable Were any new equipment or medical supplies ordered?  No What is the name of the medical supply agency? N/A Were you able to get the supplies/equipment? not applicable Do you have any questions related to the use of the equipment or supplies? No  Functional Questionnaire: (I = Independent and D = Dependent) ADLs: I  Bathing/Dressing- I  Meal Prep- I  Eating- I  Maintaining continence- I  Transferring/Ambulation- I  Managing Meds- I  Follow up appointments reviewed:   PCP Hospital f/u appt confirmed? No    Specialist Hospital f/u appt confirmed? Yes  Scheduled to see Ortho on 12/22/2020 @ 0815 and Podiatry on 01/25/2021 @ 0930.  Are transportation arrangements needed? No   If their condition worsens, is the pt aware to call PCP or go to the Emergency Dept.? Yes  Was the patient provided with contact information for the PCP's office or ED? Yes  Was to pt encouraged to call back with questions or concerns? Yes

## 2020-12-12 ENCOUNTER — Encounter: Payer: Self-pay | Admitting: Orthopedic Surgery

## 2020-12-12 ENCOUNTER — Ambulatory Visit (INDEPENDENT_AMBULATORY_CARE_PROVIDER_SITE_OTHER): Payer: Medicare Other | Admitting: Physician Assistant

## 2020-12-12 DIAGNOSIS — M869 Osteomyelitis, unspecified: Secondary | ICD-10-CM

## 2020-12-12 DIAGNOSIS — E1169 Type 2 diabetes mellitus with other specified complication: Secondary | ICD-10-CM

## 2020-12-12 LAB — CULTURE, BLOOD (ROUTINE X 2)
Culture: NO GROWTH
Culture: NO GROWTH
Special Requests: ADEQUATE
Special Requests: ADEQUATE
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
## 2020-12-12 NOTE — Progress Notes (Signed)
[facility] [place] PROGRESS NOTE  ASSESSMENT / PLAN: Richard [name] is a 60 y.o. 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 He has delayed healing. Unfortunately, I have no options for vascular reconstruction to speed his healing. He is making progress however.  Recommend the following which can slow the progression of atherosclerosis and reduce the risk of major adverse cardiac / limb events:  Complete cessation from all tobacco products. Blood glucose control with goal A1c < 7%. Blood pressure control with goal blood pressure < 140/90 mmHg. Lipid reduction therapy with goal LDL-C <100 mg/dL (<70 if symptomatic from PAD).  Aspirin 81mg  PO QD.  Atorvastatin 40-80mg  PO QD (or other "high intensity" statin therapy).  Follow up with me as needed.  SUBJECTIVE: 60 y.o. male known to me for whom I performed a bilateral lower extremity angiogram 11/30/20 for poorly healing 4/5 left ray amputation. Angiography personally reviewed again in clinic today. Patient with no hemodynamically significant stenosis on right lower extremity on angiogram. He does have tibial disease on the left. Pedal  disease was noted in bilateral lower extremities, and is likely the cause of his delayed healing.   Today in clinic he reports he is slowly improving. His wound is healing.   OBJECTIVE: BP 123/77 (BP Location: Left Arm, Patient Position: Sitting, Cuff Size: Large)   Pulse 88   Temp 98.1 F (36.7 C)   Resp 20   Ht 6\' 3"  (1.905 m)   Wt 235 lb (106.6 kg)   SpO2 98%   BMI 29.37 kg/m    Well appearing in no distress RRR Unlabored Significant improvement in left lateral foot wound. Foot is warm. No palpable pedal pulses  CBC Latest Ref Rng & Units 12/08/2020 12/07/2020 12/01/2020  WBC 4.0 - 10.5 K/uL 4.7 5.2 5.1  Hemoglobin 13.0 - 17.0 g/dL 11.6(L) 12.0(L) 12.3(L)  Hematocrit 39.0 - 52.0 % 36.6(L) 38.3(L) 38.0(L)  Platelets 150 - 400 K/uL 279 315 304      CMP Latest Ref Rng & Units 12/08/2020 12/07/2020 12/01/2020  Glucose 70 - 99 mg/dL 109(H) 86 121(H)  BUN 8 - 23 mg/dL 17 17 13   Creatinine 0.61 - 1.24 mg/dL 0.84 0.96 0.88  Sodium 135 - 145 mmol/L 133(L) 136 138  Potassium 3.5 - 5.1 mmol/L 4.2 4.3 4.8  Chloride 98 - 111 mmol/L 103 104 103  CO2 22 - 32 mmol/L 22 23 28   Calcium 8.9 - 10.3 mg/dL 8.8(L) 9.1 9.5  Total Protein 6.5 - 8.1 g/dL 7.3 - -  Total Bilirubin 0.3 - 1.2 mg/dL 0.7 - -  Alkaline Phos 38 - 126 U/L 98 - -  AST 15 - 41 U/L 21 - -  ALT 0 - 44 U/L 19 - -    Estimated Creatinine Clearance: 121.9 mL/min (by C-G formula based on SCr of 0.84 mg/dL).  Richard [name]. [name], MD [facility] Phone Number: [phone] 12/12/2020 3:28 PM

## 2020-12-13 ENCOUNTER — Encounter: Payer: Self-pay | Admitting: Vascular Surgery

## 2020-12-13 ENCOUNTER — Other Ambulatory Visit: Payer: Self-pay

## 2020-12-13 ENCOUNTER — Ambulatory Visit (INDEPENDENT_AMBULATORY_CARE_PROVIDER_SITE_OTHER): Payer: Medicare Other | Admitting: Vascular Surgery

## 2020-12-13 VITALS — BP 123/77 | HR 88 | Temp 98.1°F | Resp 20 | Ht 75.0 in | Wt 235.0 lb

## 2020-12-13 DIAGNOSIS — I70493 Other atherosclerosis of autologous vein bypass graft(s) of the extremities, bilateral legs: Secondary | ICD-10-CM | POA: Diagnosis not present

## 2020-12-14 ENCOUNTER — Ambulatory Visit: Payer: Medicare Other | Admitting: Family

## 2020-12-22 ENCOUNTER — Other Ambulatory Visit: Payer: Self-pay

## 2020-12-22 ENCOUNTER — Ambulatory Visit (INDEPENDENT_AMBULATORY_CARE_PROVIDER_SITE_OTHER): Payer: Medicare Other | Admitting: Physician Assistant

## 2020-12-22 ENCOUNTER — Encounter: Payer: Self-pay | Admitting: Orthopedic Surgery

## 2020-12-22 DIAGNOSIS — E1169 Type 2 diabetes mellitus with other specified complication: Secondary | ICD-10-CM

## 2020-12-22 DIAGNOSIS — M869 Osteomyelitis, unspecified: Secondary | ICD-10-CM
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
## 2021-01-02 ENCOUNTER — Encounter: Payer: Self-pay | Admitting: Orthopedic Surgery

## 2021-01-02 ENCOUNTER — Ambulatory Visit (INDEPENDENT_AMBULATORY_CARE_PROVIDER_SITE_OTHER): Payer: Medicare Other | Admitting: Physician Assistant

## 2021-01-02 VITALS — Ht 75.0 in | Wt 235.0 lb

## 2021-01-02 DIAGNOSIS — T8781 Dehiscence of amputation stump: Secondary | ICD-10-CM
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
## 2021-01-16 ENCOUNTER — Telehealth: Payer: Self-pay

## 2021-01-16 NOTE — Telephone Encounter (Signed)
Pt called returning Audrea Muscat call.  Please advise.

## 2021-01-19 ENCOUNTER — Encounter: Payer: Self-pay | Admitting: Orthopedic Surgery

## 2021-01-19 ENCOUNTER — Ambulatory Visit (INDEPENDENT_AMBULATORY_CARE_PROVIDER_SITE_OTHER): Payer: Medicare Other

## 2021-01-19 ENCOUNTER — Ambulatory Visit (INDEPENDENT_AMBULATORY_CARE_PROVIDER_SITE_OTHER): Payer: Medicare Other | Admitting: Physician Assistant

## 2021-01-19 DIAGNOSIS — M25562 Pain in left knee: Secondary | ICD-10-CM | POA: Diagnosis not present

## 2021-01-19 DIAGNOSIS — G8929 Other chronic pain: Secondary | ICD-10-CM

## 2021-01-19 MED ORDER — MELOXICAM 15 MG PO TABS: 15.0000 mg | ORAL_TABLET | Freq: Every day | ORAL | 0 refills | Status: DC
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
## 2021-01-23 ENCOUNTER — Other Ambulatory Visit: Payer: Self-pay

## 2021-01-23 MED ORDER — ALBUTEROL SULFATE (2.5 MG/3ML) 0.083% IN NEBU
2.5000 mg | INHALATION_SOLUTION | Freq: Four times a day (QID) | RESPIRATORY_TRACT | 11 refills | Status: DC | PRN
Start: 2021-01-23 — End: 2021-12-14

## 2021-01-23 NOTE — Telephone Encounter (Signed)
Need refill on  albuterol (PROVENTIL) (2.5 MG/3ML) 0.083% nebulizer solution ;pt contact [place], [place]

## 2021-01-25 ENCOUNTER — Ambulatory Visit: Payer: Medicare Other | Admitting: Podiatry

## 2021-01-25 ENCOUNTER — Other Ambulatory Visit: Payer: Self-pay

## 2021-01-26 MED ORDER — LEVOCETIRIZINE DIHYDROCHLORIDE 5 MG PO TABS: 5.0000 mg | ORAL_TABLET | Freq: Every evening | ORAL | 3 refills | Status: DC

## 2021-01-30 ENCOUNTER — Ambulatory Visit: Payer: Medicare Other | Admitting: Orthopedic Surgery

## 2021-01-31 ENCOUNTER — Ambulatory Visit (INDEPENDENT_AMBULATORY_CARE_PROVIDER_SITE_OTHER): Payer: Medicare Other | Admitting: Infectious Disease

## 2021-01-31 ENCOUNTER — Other Ambulatory Visit: Payer: Self-pay

## 2021-01-31 ENCOUNTER — Encounter: Payer: Self-pay | Admitting: Infectious Disease

## 2021-01-31 VITALS — BP 120/77 | HR 93 | Wt 231.0 lb

## 2021-01-31 DIAGNOSIS — E1169 Type 2 diabetes mellitus with other specified complication: Secondary | ICD-10-CM

## 2021-01-31 DIAGNOSIS — A4902 Methicillin resistant Staphylococcus aureus infection, unspecified site: Secondary | ICD-10-CM | POA: Diagnosis not present

## 2021-01-31 DIAGNOSIS — M86172 Other acute osteomyelitis, left ankle and foot: Secondary | ICD-10-CM

## 2021-01-31 DIAGNOSIS — I739 Peripheral vascular disease, unspecified: Secondary | ICD-10-CM

## 2021-01-31 DIAGNOSIS — M869 Osteomyelitis, unspecified: Secondary | ICD-10-CM

## 2021-01-31 DIAGNOSIS — E1140 Type 2 diabetes mellitus with diabetic neuropathy, unspecified: Secondary | ICD-10-CM
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
## 2021-02-01 ENCOUNTER — Telehealth: Payer: Self-pay

## 2021-02-01 LAB — BASIC METABOLIC PANEL WITH GFR
BUN: 20 mg/dL (ref 7–25)
CO2: 27 mmol/L (ref 20–32)
Calcium: 9.8 mg/dL (ref 8.6–10.3)
Chloride: 103 mmol/L (ref 98–110)
Creat: 1.07 mg/dL (ref 0.70–1.25)
GFR, Est African American: 86 mL/min/{1.73_m2} (ref >=60)
GFR, Est Non African American: 75 mL/min/{1.73_m2} (ref >=60)
Glucose, Bld: 111 mg/dL — ABNORMAL HIGH (ref 65–99)
Potassium: 5.2 mmol/L (ref 3.5–5.3)
Sodium: 138 mmol/L (ref 135–146)

## 2021-02-01 LAB — CBC WITH DIFFERENTIAL/PLATELET
Absolute Monocytes: 534 cells/uL (ref 200–950)
Basophils Absolute: 51 cells/uL (ref 0–200)
Basophils Relative: 1.1 %
Eosinophils Absolute: 534 cells/uL — ABNORMAL HIGH (ref 15–500)
Eosinophils Relative: 11.6 %
HCT: 42.2 % (ref 38.5–50.0)
Hemoglobin: 13.5 g/dL (ref 13.2–17.1)
Lymphs Abs: 869 cells/uL (ref 850–3900)
MCH: 25.7 pg — ABNORMAL LOW (ref 27.0–33.0)
MCHC: 32 g/dL (ref 32.0–36.0)
MCV: 80.2 fL (ref 80.0–100.0)
MPV: 10.7 fL (ref 7.5–12.5)
Monocytes Relative: 11.6 %
Neutro Abs: 2613 cells/uL (ref 1500–7800)
Neutrophils Relative %: 56.8 %
Platelets: 261 10*3/uL (ref 140–400)
RBC: 5.26 10*6/uL (ref 4.20–5.80)
RDW: 15.6 % — ABNORMAL HIGH (ref 11.0–15.0)
Total Lymphocyte: 18.9 %
WBC: 4.6 10*3/uL (ref 3.8–10.8)

## 2021-02-01 LAB — C-REACTIVE PROTEIN: CRP: 2 mg/L (ref <8.0)

## 2021-02-01 LAB — SEDIMENTATION RATE: Sed Rate: 6 mm/h (ref 0–20)

## 2021-02-01 NOTE — Telephone Encounter (Signed)
-----   Message from Truman Hayward, MD sent at 02/01/2021 11:22 AM EDT ----- Labs look great off antibiotics

## 2021-02-01 NOTE — Telephone Encounter (Signed)
Spoke with patient to let him know per Dr. Tommy Medal that his labs look great off antibiotics. Reminded patient of follow up appointment in August. Advised patient to continue plan of care discussed at his appointment yesterday. Patient verbalized understanding and has no further questions.   Beryle Flock, RN

## 2021-02-06 ENCOUNTER — Encounter: Payer: Self-pay | Admitting: Gastroenterology

## 2021-02-06 ENCOUNTER — Ambulatory Visit: Payer: Medicare Other | Admitting: Podiatry

## 2021-02-16 ENCOUNTER — Ambulatory Visit (INDEPENDENT_AMBULATORY_CARE_PROVIDER_SITE_OTHER): Payer: Medicare Other | Admitting: Orthopedic Surgery

## 2021-02-16 DIAGNOSIS — Z89422 Acquired absence of other left toe(s): Secondary | ICD-10-CM

## 2021-02-16 DIAGNOSIS — M25562 Pain in left knee: Secondary | ICD-10-CM | POA: Diagnosis not present

## 2021-02-16 DIAGNOSIS — G8929 Other chronic pain: Secondary | ICD-10-CM | POA: Diagnosis not present

## 2021-02-17 ENCOUNTER — Encounter: Payer: Self-pay | Admitting: Orthopedic Surgery

## 2021-02-17 MED ORDER — MELOXICAM 15 MG PO TABS: 15.0000 mg | ORAL_TABLET | Freq: Every day | ORAL | 0 refills | Status: DC
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
## 2021-02-27 ENCOUNTER — Other Ambulatory Visit: Payer: Self-pay | Admitting: Internal Medicine

## 2021-02-27 NOTE — Telephone Encounter (Signed)
Refill Request-Pt reports his pharmacy asked him to contact his PCP because he has no more refills on the following Medication:  albuterol (VENTOLIN HFA) 108 (90 Base) MCG/ACT inhaler   [facility] [place], [place] [address] (Ph: [phone])

## 2021-02-28 MED ORDER — ALBUTEROL SULFATE HFA 108 (90 BASE) MCG/ACT IN AERS: INHALATION_SPRAY | RESPIRATORY_TRACT | 3 refills | Status: DC

## 2021-03-02 ENCOUNTER — Ambulatory Visit (INDEPENDENT_AMBULATORY_CARE_PROVIDER_SITE_OTHER): Payer: Medicare Other | Admitting: Internal Medicine

## 2021-03-02 ENCOUNTER — Other Ambulatory Visit: Payer: Self-pay

## 2021-03-02 ENCOUNTER — Encounter: Payer: Self-pay | Admitting: Internal Medicine

## 2021-03-02 VITALS — BP 110/71 | HR 92 | Temp 98.1°F | Ht 74.0 in | Wt 233.7 lb

## 2021-03-02 DIAGNOSIS — E1140 Type 2 diabetes mellitus with diabetic neuropathy, unspecified: Secondary | ICD-10-CM

## 2021-03-02 DIAGNOSIS — E1151 Type 2 diabetes mellitus with diabetic peripheral angiopathy without gangrene: Secondary | ICD-10-CM

## 2021-03-02 DIAGNOSIS — J9859 Other diseases of mediastinum, not elsewhere classified: Secondary | ICD-10-CM

## 2021-03-02 DIAGNOSIS — J4550 Severe persistent asthma, uncomplicated: Secondary | ICD-10-CM

## 2021-03-02 DIAGNOSIS — I050 Rheumatic mitral stenosis: Secondary | ICD-10-CM | POA: Insufficient documentation

## 2021-03-02 DIAGNOSIS — Z89422 Acquired absence of other left toe(s): Secondary | ICD-10-CM

## 2021-03-02 DIAGNOSIS — I350 Nonrheumatic aortic (valve) stenosis: Secondary | ICD-10-CM | POA: Diagnosis not present

## 2021-03-02 DIAGNOSIS — D126 Benign neoplasm of colon, unspecified: Secondary | ICD-10-CM | POA: Diagnosis not present

## 2021-03-02 DIAGNOSIS — I700 Atherosclerosis of aorta: Secondary | ICD-10-CM

## 2021-03-02 LAB — POCT GLYCOSYLATED HEMOGLOBIN (HGB A1C): Hemoglobin A1C: 7 % — AB (ref 4.0–5.6)

## 2021-03-02 LAB — GLUCOSE, CAPILLARY: Glucose-Capillary: 150 mg/dL — ABNORMAL HIGH (ref 70–99)

## 2021-03-02 MED ORDER — MONTELUKAST SODIUM 10 MG PO TABS: 10.0000 mg | ORAL_TABLET | Freq: Every day | ORAL | 3 refills | Status: DC

## 2021-03-02 MED ORDER — PREGABALIN 100 MG PO CAPS: 100.0000 mg | ORAL_CAPSULE | Freq: Three times a day (TID) | ORAL | 5 refills | Status: DC

## 2021-03-02 NOTE — Assessment & Plan Note (Signed)
Lyrica continues to be beneficial requesting refill which I provided today.

## 2021-03-02 NOTE — Assessment & Plan Note (Signed)
He was noted to have mild aortic stenosis on echocardiogram back in October 2021.  He does have systolic murmur on exam.  We will need repeat echo in about 3 years.

## 2021-03-02 NOTE — Assessment & Plan Note (Signed)
He is very pleased that he has been released from Dr. Sharol Given to follow-up as needed.  Last seen about 2 weeks ago with excellent healing off antibiotics.  Last follow-up with ID in late April showed normal sed rate and CRP.

## 2021-03-02 NOTE — Patient Instructions (Signed)
COVID-19 Vaccine Information can be found at: [email or web address] For questions related to vaccine distribution or appointments, please email [email or web address] or call [phone].    

## 2021-03-02 NOTE — Assessment & Plan Note (Signed)
Aortic atherosclerosis remained stable he remains on atorvastatin.

## 2021-03-02 NOTE — Progress Notes (Signed)
  Subjective:  HPI: [name] is a 60 y.o. male who presents for f/u dm  Please see Assessment and Plan below for the status of his chronic medical problems.  Objective:  Physical Exam: Vitals:   03/02/21 0854  BP: 110/71  Pulse: 92  Temp: 98.1 F (36.7 C)  TempSrc: Oral  SpO2: 99%  Weight: 233 lb 11.2 oz (106 kg)  Height: 6\' 2"  (1.88 m)   Body mass index is 30.01 kg/m. Physical Exam Constitutional:      Appearance: He is not ill-appearing.  Cardiovascular:     Rate and Rhythm: Normal rate and regular rhythm.     Heart sounds: Murmur (2/6 systolic) heard.    Pulmonary:     Effort: Pulmonary effort is normal.     Breath sounds: No wheezing.  Musculoskeletal:     Comments: Right foot with healed amputation site, no erythema or tenderness.  Neurological:     Mental Status: He is alert.  Psychiatric:        Mood and Affect: Mood normal.        Thought Content: Thought content normal.    Assessment & Plan:  See Encounters Tab for problem based charting.  Medications Ordered Meds ordered this encounter  Medications  . montelukast (SINGULAIR) 10 MG tablet    Sig: Take 1 tablet (10 mg total) by mouth at bedtime.    Dispense:  90 tablet    Refill:  3  . pregabalin (LYRICA) 100 MG capsule    Sig: Take 1 capsule (100 mg total) by mouth 3 (three) times daily.    Dispense:  90 capsule    Refill:  5   Other Orders Orders Placed This Encounter  Procedures  . Glucose, capillary  . Ambulatory referral to Gastroenterology    Referral Priority:   Routine    Referral Type:   Consultation    Referral Reason:   Specialty Services Required    Referred to Provider:   Mauri Pole, MD    Number of Visits Requested:   1  . POC Hbg A1C   Follow Up: Return 3-4 months with Dr Heber [place].

## 2021-03-02 NOTE — Assessment & Plan Note (Signed)
Was noted mild-moderate mitral valve stenosis on echocardiogram in October 2021 we will need repeat echocardiogram at least by 2024

## 2021-03-02 NOTE — Assessment & Plan Note (Signed)
HPI: He reports that his diet has been a little worse lately.  He has eaten some sweets but he has tried to limit it.  He has not noted any hyper or hypoglycemic episodes.  Denies any polyuria polydipsia weight has remained stable.  Taking all of his medications with out side effects.  Assessment controlled type 2 diabetes with diabetic PAD, diabetic neuropathy  Plan Continue Trulicity 1.5 mg weekly Continue Synjardy 02-999 twice daily Continue pioglitazone 30 mg daily

## 2021-03-02 NOTE — Assessment & Plan Note (Signed)
>>  ASSESSMENT AND PLAN FOR HISTORY OF COMPLETE RAY AMPUTATION OF FIFTH TOE OF LEFT FOOT (HCC) WRITTEN ON 03/02/2021  9:40 AM BY Jontavia Leatherbury C, DO  He is very pleased that he has been released from Dr. Julio Ohm to follow-up as needed.  Last seen about 2 weeks ago with excellent healing off antibiotics.  Last follow-up with ID in late April showed normal sed rate and CRP.

## 2021-03-09 ENCOUNTER — Telehealth: Payer: Self-pay | Admitting: *Deleted

## 2021-03-09 NOTE — Chronic Care Management (AMB) (Signed)
  Care Management   Note  03/09/2021 Name: Richard [name] MRN: [id number] DOB: December 08, 1960  Richard [name] is a 61 y.o. year old male who is a primary care patient of [name], DO. I reached out to [name] by phone today in response to a referral sent by [name], Rachel Moulds, DO.   [name] was given information about care management services today including:  1. Care management services include personalized support from designated clinical staff supervised by his physician, including individualized plan of care and coordination with other care providers 2. 24/7 contact phone numbers for assistance for urgent and routine care needs. 3. The patient may stop care management services at any time by phone call to the office staff.  Patient agreed to services and verbal consent obtained.   Follow up plan: Telephone appointment with care management team member scheduled for:03/20/2021  [place] Management

## 2021-03-09 NOTE — Chronic Care Management (AMB) (Signed)
  Care Management   Outreach Note  03/09/2021 Name: Richard [name] MRN: [id number] DOB: 09/07/1961  Referred by: [name], DO Reason for referral : Care Coordination (Outreach to schedule initial call with BSW from [facility] )   An unsuccessful telephone outreach was attempted today. The patient was referred to the case management team for assistance with care management and care coordination.   Follow Up Plan: A HIPAA compliant phone message was left for the patient providing contact information and requesting a return call. The care management team will reach out to the patient again over the next 7 days. If patient returns call to provider office, please advise to call [place] at [phone].  [place] Management

## 2021-03-20 ENCOUNTER — Ambulatory Visit: Payer: Medicare Other | Admitting: Licensed Clinical Social Worker

## 2021-03-20 NOTE — Chronic Care Management (AMB) (Signed)
  Care Management   Social Work Visit Note  03/20/2021 Name: Richard [name] MRN: [id number] DOB: 1961-02-06  Richard [name] is a 60 y.o. year old male who sees Richard Groves, DO for primary care. The care management team was consulted for assistance with care management and care coordination needs related to  initial visit with SW.    Patient was given the following information about care management and care coordination services today, agreed to services, and gave verbal consent: 1.care management/care coordination services include personalized support from designated clinical staff supervised by their physician, including individualized plan of care and coordination with other care providers 2. 24/7 contact phone numbers for assistance for urgent and routine care needs. 3. The patient may stop care management/care coordination services at any time by phone call to the office staff.  Engaged with patient by telephone for initial visit in response to provider referral for social work chronic care management and care coordination services.  Assessment: Review of patient history, allergies, and health status during evaluation of patient need for care management/care coordination services.    Interventions:  SW informed patient of SW role.  Patient advised he would call SW back at a later time once leaving the bank.  SW provided contact information.  SW was unable to complete SW assessment or SDOH screening during call.  SW reviewed patients chart.     Plan:  Patient will return SW call. SW will follow up with patient within the next 30-days.   Richard [name], [name]  Social Worker [facility] Care Management  [phone]

## 2021-03-20 NOTE — Patient Instructions (Signed)
Visit Information  Instructions:  Patient was unable to talk during scheudled visit. Patient advised he would return call.  Patient was given the following information about care management and care coordination services today, agreed to services, and gave verbal consent: 1.care management/care coordination services include personalized support from designated clinical staff supervised by their physician, including individualized plan of care and coordination with other care providers 2. 24/7 contact phone numbers for assistance for urgent and routine care needs. 3. The patient may stop care management/care coordination services at any time by phone call to the office staff.  Patient verbalizes understanding of instructions provided today and agrees to view in [place].   The patient has been provided with contact information for the care management team and has been advised to call with any health related questions or concerns.   Milus Height, BSW  Social Worker [facility] Care Management  [phone]     Visit Information   Patient verbalizes understanding of instructions provided today and agrees to view in [place].   The patient has been provided with contact information for the care management team and has been advised to call with any health related questions or concerns.   Milus Height, [place]  Social Worker [facility] Care Management  [phone]

## 2021-03-22 ENCOUNTER — Other Ambulatory Visit: Payer: Self-pay

## 2021-03-22 MED ORDER — SYNJARDY 5-1000 MG PO TABS: 1.0000 | ORAL_TABLET | Freq: Two times a day (BID) | ORAL | 3 refills | Status: DC

## 2021-03-22 MED ORDER — ACCU-CHEK SOFTCLIX LANCETS MISC: 12 refills | Status: DC

## 2021-03-22 NOTE — Telephone Encounter (Addendum)
  Empagliflozin-metFORMIN HCl (SYNJARDY) 02-999 MG TABS, and Sofpclix refill request. Please call pt back.

## 2021-03-22 NOTE — Addendum Note (Signed)
Addended by: Ebbie Latus on: 03/22/2021 01:34 PM   Modules accepted: Orders

## 2021-03-27 ENCOUNTER — Telehealth: Payer: Self-pay | Admitting: Internal Medicine

## 2021-03-27 NOTE — Telephone Encounter (Signed)
18 g with 3 refills sent 02/28/21. Call placed to [place] at [place]. States she has this Rx and will get it ready now.

## 2021-03-27 NOTE — Telephone Encounter (Signed)
Refill Request-  Pt states he has contacted the pharmacy.  Pt was asked to contact his PCP for the following medication refill.   albuterol (VENTOLIN HFA) 108 (90 Base) MCG/ACT inhaler   [facility] [place], [address] (Ph: [phone])

## 2021-03-31 ENCOUNTER — Ambulatory Visit (INDEPENDENT_AMBULATORY_CARE_PROVIDER_SITE_OTHER): Payer: Medicare Other | Admitting: Podiatry

## 2021-03-31 ENCOUNTER — Encounter: Payer: Self-pay | Admitting: Podiatry

## 2021-03-31 ENCOUNTER — Other Ambulatory Visit: Payer: Self-pay

## 2021-03-31 DIAGNOSIS — B351 Tinea unguium: Secondary | ICD-10-CM | POA: Diagnosis not present

## 2021-03-31 DIAGNOSIS — M79674 Pain in right toe(s): Secondary | ICD-10-CM | POA: Diagnosis not present

## 2021-03-31 DIAGNOSIS — L84 Corns and callosities: Secondary | ICD-10-CM | POA: Diagnosis not present

## 2021-03-31 DIAGNOSIS — M79675 Pain in left toe(s): Secondary | ICD-10-CM

## 2021-03-31 DIAGNOSIS — I739 Peripheral vascular disease, unspecified: Secondary | ICD-10-CM | POA: Diagnosis not present

## 2021-03-31 DIAGNOSIS — E1140 Type 2 diabetes mellitus with diabetic neuropathy, unspecified: Secondary | ICD-10-CM

## 2021-03-31 NOTE — Progress Notes (Signed)
This patient returns to my office for at risk foot care.  This patient requires this care by a professional since this patient will be at risk due to having diabetes and PAD and amputation left foot.     This patient is unable to cut nails himself since the patient cannot reach his nails.These nails are painful walking and wearing shoes. Patient says the skin lesion under his right forefoot  has thickened.   This patient presents for at risk foot care today.  General Appearance  Alert, conversant and in no acute stress.  Vascular  Dorsalis pedis and posterior tibial  pulses are diminished palpable  B/L.Marland Kitchen  Capillary return is within normal limits  right. Temperature is within normal limits  bilaterally.  Neurologic  Senn-Weinstein monofilament wire test absent   bilaterally. Muscle power within normal limits bilaterally.  Nails Thick disfigured discolored nails with subungual debris  from hallux to fifth toes right and 1-3 left foot.. No evidence of bacterial infection or drainage bilaterally.  Orthopedic  No limitations of motion  feet .  No crepitus or effusions noted.  No bony pathology or digital deformities noted. Hammer toes  B/L.  Plantar flexed second met right foot. Amputation digits left foot.  Skin  Thick necrotic skin lesion sub 2 right foot.  Hemorrhagic callus noted.  No signs of infections or ulcers noted.     Onychomycosis  Pain in right toes  Pain in left toes  Callus right foot.  Consent was obtained for treatment procedures.   Mechanical debridement of nails 1-5  right  and 1-3 left foot. were  performed with a nail nipper.  Filed with dremel without incident.   Debridement of skin lesion with a # 15 blade. Callus right foot has lessened.  Return office visit   9  weeks.                  Told patient to return for periodic foot care and evaluation due to potential at risk complications.   Gardiner Barefoot DPM

## 2021-04-11 ENCOUNTER — Encounter: Payer: Self-pay | Admitting: *Deleted

## 2021-04-17 ENCOUNTER — Telehealth: Payer: Self-pay

## 2021-04-17 ENCOUNTER — Telehealth: Payer: Medicare Other

## 2021-04-17 ENCOUNTER — Telehealth: Payer: Self-pay | Admitting: Licensed Clinical Social Worker

## 2021-04-17 NOTE — Telephone Encounter (Signed)
Patient has refills available on both Rx's (Nebs and HFA).  Pharmacy called and RN spoke with pharmacist who states they have the albuterol nebulizer RX in stock and will get it ready for the patient to pick up for this afternoon.  Pharmacist also states this [facility] location is permanently closing in 2 days and she is unable to place any orders for medication.  She does not have the Reardan in stock.  RN asked pharmacist to call the patient and inform him of above, as well as, procedure for transferring his RX's.  She states she will.  This RN also placed a call to patient, VM was obtained and a message was left that he had refills available, RN spoke to his pharmacy, and for him to please call his pharmacy. SChaplin, RN,BSN

## 2021-04-17 NOTE — Telephone Encounter (Signed)
albuterol (PROVENTIL) (2.5 MG/3ML) 0.083% nebulizer solution  albuterol (VENTOLIN HFA) 108 (90 Base) MCG/ACT inhaler, REFILL REQUEST @ [facility] [address].   Per patient pharmacy is waiting for the clinic to reply back on this meds. States he needs this meds by today.

## 2021-04-17 NOTE — Telephone Encounter (Signed)
  Care Management   Follow Up Note   04/17/2021 Name: Mckinley Olheiser MRN: [id number] DOB: [birth year]   Referred by: [name], DO Reason for referral : No chief complaint on file.   Third unsuccessful telephone outreach was attempted today. The patient was referred to the case management team for assistance with care management and care coordination. The patient's primary care provider has been notified of our unsuccessful attempts to make or maintain contact with the patient. The care management team is pleased to engage with this patient at any time in the future should he/she be interested in assistance from the care management team.   Follow Up Plan: Telephone follow up appointment with care management team member scheduled for:04/18/2021  Milus Height, [place] Worker [facility] Care Management  [phone]

## 2021-04-18 ENCOUNTER — Telehealth: Payer: Medicare Other

## 2021-05-02 ENCOUNTER — Encounter: Payer: Self-pay | Admitting: *Deleted

## 2021-05-23 ENCOUNTER — Ambulatory Visit: Payer: Medicare Other | Admitting: Infectious Disease

## 2021-05-24 ENCOUNTER — Ambulatory Visit: Payer: Medicare Other | Admitting: Infectious Disease

## 2021-05-29 ENCOUNTER — Ambulatory Visit (INDEPENDENT_AMBULATORY_CARE_PROVIDER_SITE_OTHER): Payer: Medicare Other | Admitting: Podiatrist

## 2021-05-29 ENCOUNTER — Encounter: Payer: Self-pay | Admitting: Podiatrist

## 2021-05-29 ENCOUNTER — Other Ambulatory Visit: Payer: Self-pay

## 2021-05-29 DIAGNOSIS — L97511 Non-pressure chronic ulcer of other part of right foot limited to breakdown of skin: Secondary | ICD-10-CM | POA: Diagnosis not present

## 2021-05-29 MED ORDER — MUPIROCIN 2 % EX OINT: 1.0000 "application " | TOPICAL_OINTMENT | Freq: Two times a day (BID) | CUTANEOUS | 2 refills | Status: DC

## 2021-05-29 NOTE — Progress Notes (Signed)
Chief Complaint  Patient presents with   Callouses    Right foot callus      HPI: Patient is 60 y.o. male who presents today for concern over a callus on the right foot. He relates he has seen some blood come from the area and he would like it looked at. He has a history of a partial left foot amputation.. he has peripheral neuropathy with no feeling in his feet reported.  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 ([place]) 08/03/2020   Osteomyelitis of left foot ([place]) 07/26/2020   Cellulitis 07/19/2020   Impingement syndrome of right shoulder 01/29/2020   Lumbar strain 11/28/2018   Mediastinal mass 08/25/2018   Cerumen impaction 08/21/2018   Osteoarthritis of left knee 01/09/2018   Peripheral arterial disease ([place]) 10/21/2017   Erectile dysfunction 03/06/2017   Abdominal aortic atherosclerosis ([place]) 01/18/2017   Vitamin D deficiency 07/02/2016   Tubular adenoma of colon 02/06/2016   Diabetic neuropathy, painful ([place]) 11/30/2015   Overweight (BMI 25.0-29.9) 11/30/2015   CAD in native artery 10/20/2015   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   Physical Exam  GENERAL APPEARANCE: Alert, conversant.  Appropriately groomed. No acute distress.   VASCULAR: Pedal pulses faintly palpable DP and PT bilateral.  Capillary refill time is immediate to all digits,  Proximal to distal cooling it warm to warm.  Digital perfusion adequate.   NEUROLOGIC: sensation is intact to 5.07 monofilament at 0/5 sites bilateral.  Light touch is diminshed bilateral, vibratory sensation absent bilateral  MUSCULOSKELETAL: acceptable muscle strength, tone and stability bilateral.  Amputation left foot digits 4,5 noted.    DERMATOLOGIC: skin is warm, supple, and dry.  Large callus with ulceration present submetatarsal 2/3 right foot.  Once pared, full thickness ulceration noted. No malodor, no drainage, no pus or pirulence noted.   Ulcer measures 0.5cm x 0.8 cm x 0.5cm in depth.  Periulcer callus tissue is present   Assessment     ICD-10-CM   1. Ulcerated, foot, right, limited to breakdown of skin ([place])  L97.511        Plan  Exam and findings discussed with the patient.  I educated him on his ulceration and risks of wosening/ infection as well as recommendations for healing.   -I pared the ulcer to healthy and viable tissue with a 15 blade.  Iodosorb and a dry sterile dressing was applied. -  I dispensed an air fracture walker for him to wear especially when at school and walking long distances - I wrote a prescption for mupirocin ointment for him to use daily with a dressing - he will be seen back for a recheck in 2-3  weeks. If any redness, swelling, or signs of infection arise he is to call asap.

## 2021-05-29 NOTE — Patient Instructions (Signed)
Cleanse the foot daily and dry-  apply the mupirocin ointment to the wound and cover.  Wear the boot when up and on the foot to protect the foot and allow the ulcer to heal.

## 2021-05-30 NOTE — Addendum Note (Signed)
Addended by: Hulan Fray on: 05/30/2021 05:01 PM   Modules accepted: Orders

## 2021-06-01 ENCOUNTER — Other Ambulatory Visit: Payer: Self-pay

## 2021-06-01 MED ORDER — ALBUTEROL SULFATE HFA 108 (90 BASE) MCG/ACT IN AERS: INHALATION_SPRAY | RESPIRATORY_TRACT | 3 refills | Status: DC

## 2021-06-01 NOTE — Telephone Encounter (Signed)
albuterol (VENTOLIN HFA) 108 (90 Base) MCG/ACT inhaler, REFILL REQUEST @ [place] [facility] [place], [place] [facility].  Requesting the meds to be filled by today, states the pharmacy has faxed the request.

## 2021-06-02 ENCOUNTER — Other Ambulatory Visit: Payer: Medicare Other

## 2021-06-02 ENCOUNTER — Ambulatory Visit: Payer: Medicare Other | Admitting: Podiatry

## 2021-06-08 ENCOUNTER — Encounter: Payer: Medicare Other | Admitting: Internal Medicine

## 2021-06-13 ENCOUNTER — Other Ambulatory Visit: Payer: Self-pay

## 2021-06-13 ENCOUNTER — Ambulatory Visit (INDEPENDENT_AMBULATORY_CARE_PROVIDER_SITE_OTHER): Payer: Medicare Other | Admitting: Podiatry

## 2021-06-13 DIAGNOSIS — L97511 Non-pressure chronic ulcer of other part of right foot limited to breakdown of skin: Secondary | ICD-10-CM | POA: Diagnosis not present

## 2021-06-19 NOTE — Progress Notes (Signed)
  Chief Complaint  Patient presents with   Diabetic Ulcer    Right foot ulcer and callus recheck. Pt states since his foot was shaved last visit he has no pain or other symptoms.      HPI: Patient is 60 y.o. male who presents today for follow-up of callus to right foot  Physical Exam  GENERAL APPEARANCE: Alert, conversant. Appropriately groomed. No acute distress.   VASCULAR: Pedal pulses faintly palpable DP and PT bilateral.  Capillary refill time is immediate to all digits,  Proximal to distal cooling it warm to warm.  Digital perfusion adequate.   NEUROLOGIC: sensation is intact to 5.07 monofilament at 0/5 sites bilateral.  Light touch is diminshed bilateral, vibratory sensation absent bilateral  MUSCULOSKELETAL: acceptable muscle strength, tone and stability bilateral.  Amputation left foot digits 4,5 noted.    DERMATOLOGIC: skin is warm, supple, and dry.  Large callus without open ulceration noted upon debridement  Assessment     ICD-10-CM   1. Ulcerated, foot, right, limited to breakdown of skin ([place])  L97.511      Plan  Pre-ulcer right foot -Gently pared remaining hyperkeratotic tissue.  No open ulceration noted upon debridement.  Should pain persist would consider possible metatarsal osteotomy to left the bone.  Patient like to think it over.  We will follow-up as needed.

## 2021-06-20 ENCOUNTER — Other Ambulatory Visit: Payer: Self-pay

## 2021-06-20 ENCOUNTER — Ambulatory Visit (INDEPENDENT_AMBULATORY_CARE_PROVIDER_SITE_OTHER): Payer: Medicare Other | Admitting: *Deleted

## 2021-06-20 DIAGNOSIS — E1140 Type 2 diabetes mellitus with diabetic neuropathy, unspecified: Secondary | ICD-10-CM

## 2021-06-20 DIAGNOSIS — L97511 Non-pressure chronic ulcer of other part of right foot limited to breakdown of skin: Secondary | ICD-10-CM

## 2021-06-20 DIAGNOSIS — I739 Peripheral vascular disease, unspecified: Secondary | ICD-10-CM

## 2021-06-20 NOTE — Progress Notes (Signed)
Patient presents to the office today for diabetic shoe and insole measuring.  Patient was measured with brannock device to determine size and width for 1 pair of extra depth shoes and foam casted for 3 pair of insoles.   Documentation of medical necessity will be sent to patient's treating diabetic doctor to verify and sign.   Patient's diabetic provider: [name], DO (Dr. Dorian Pod)  Shoes and insoles will be ordered at that time and patient will be notified for an appointment for fitting when they arrive.   Shoe size (per patient): 13   Brannock measurement: RIGHT - 13.5 C, LEFT - 13.5 A  ~Patient has amputations 4th and 5th toes left~  Patient shoe selection-   1st choice:   Apex [id number]  2nd choice:  [id number]   Shoe size ordered: Men's 13.5 Wide

## 2021-07-03 ENCOUNTER — Ambulatory Visit (INDEPENDENT_AMBULATORY_CARE_PROVIDER_SITE_OTHER): Payer: Medicare Other | Admitting: Internal Medicine

## 2021-07-03 ENCOUNTER — Telehealth: Payer: Self-pay | Admitting: Internal Medicine

## 2021-07-03 ENCOUNTER — Other Ambulatory Visit: Payer: Self-pay

## 2021-07-03 ENCOUNTER — Ambulatory Visit (HOSPITAL_COMMUNITY)
Admission: RE | Admit: 2021-07-03 | Discharge: 2021-07-03 | Disposition: A | Discharge status: Home / Self Care | Payer: Medicare Other | Source: Ambulatory Visit | Attending: Internal Medicine | Admitting: Internal Medicine

## 2021-07-03 VITALS — BP 108/71 | HR 99 | Temp 98.7°F | Resp 28 | Ht 74.0 in | Wt 233.8 lb

## 2021-07-03 DIAGNOSIS — R0789 Other chest pain: Secondary | ICD-10-CM | POA: Diagnosis present

## 2021-07-03 DIAGNOSIS — J449 Chronic obstructive pulmonary disease, unspecified: Secondary | ICD-10-CM | POA: Diagnosis not present

## 2021-07-03 DIAGNOSIS — J4489 Other specified chronic obstructive pulmonary disease: Secondary | ICD-10-CM

## 2021-07-03 DIAGNOSIS — J45901 Unspecified asthma with (acute) exacerbation: Secondary | ICD-10-CM

## 2021-07-03 MED ORDER — ALBUTEROL SULFATE (2.5 MG/3ML) 0.083% IN NEBU
2.5000 mg | INHALATION_SOLUTION | Freq: Once | RESPIRATORY_TRACT | Status: AC
  Administered 2021-07-03: 2.5 mg via RESPIRATORY_TRACT

## 2021-07-03 MED ORDER — PREDNISONE 20 MG PO TABS: 40.0000 mg | ORAL_TABLET | Freq: Every day | ORAL | 0 refills | Status: AC

## 2021-07-03 MED ORDER — METHYLPREDNISOLONE SODIUM SUCC 125 MG IJ SOLR
125.0000 mg | Freq: Once | INTRAMUSCULAR | Status: AC
  Administered 2021-07-03: 125 mg via INTRAMUSCULAR

## 2021-07-03 NOTE — Assessment & Plan Note (Addendum)
Patient presented today with worsening shortness of breath and chest tightness x 1 week. He stated that 1 week ago, he was exposed to shrimp fumes in his culinary class (he is allergic to shellfish) and started feeling short of breath. He has never had an allergic reaction to the fumes of shellfish before. EMS was called and the patient was given an albuterol treatment which helped him feel better. Since then, he has experienced intermittent chest tightness and difficulty breathing for the past week. He states that the chest tightness will come on all of a sudden and will go away after ~3-4 minutes after he rests. He has been using his dulera inhaler BID and albuterol PRN and notes that he has still had shortness of breath. He denies any fevers, chills, dizziness, cough, sputum production, n/v/d, or any other sxs at this time. EKG performed in the clinic which ruled out STEMI. Chest tightness and his sxs are likely secondary to asthma exacerbation, as the patient has significant inspiratory and expiratory wheezing on exam  Plan: - Given 125 mg solumedrol in clinic, as well as albuterol neb treatment - Prednisone 40 mg x 5 days - Continue Dulera and Albuterol as prescribed - Advised patient to go to the ER if his chest pain and SOB worsen

## 2021-07-03 NOTE — Progress Notes (Signed)
   CC: chest tightness and SOB  HPI:  [name] is a 60 y.o. male with HTN, DM, COPD, and HLD who presents to the [facility] for chest pain/tightness and SOB x 1 week. Please see problem-based list for further details, assessments, and plans.   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)    neuropathy in feet   No natural teeth    Substance abuse ([place])    crack - recovered x 30 yrs   Type II diabetes mellitus ([place])    diagnosed in 2013   Review of Systems:  Review of Systems  Constitutional:  Negative for chills and fever.  HENT:  Negative for sinus pain and sore throat.   Eyes: Negative.   Respiratory:  Positive for cough, shortness of breath and wheezing. Negative for sputum production.   Cardiovascular:  Positive for chest pain and palpitations. Negative for leg swelling.  Gastrointestinal:  Negative for diarrhea, nausea and vomiting.  Genitourinary: Negative.   Musculoskeletal: Negative.   Neurological:  Negative for dizziness and headaches.    Physical Exam:  Vitals:   07/03/21 1443  BP: 108/71  Pulse: 99  Resp: (!) 28  Temp: 98.7 F (37.1 C)  TempSrc: Oral  SpO2: 99%  Weight: 233 lb 12.8 oz (106.1 kg)  Height: 6\' 2"  (1.88 m)   General: No acute distress. Head: Normocephalic. Atraumatic. CV: Tachycardic rate, regular rhythm. Systolic murmur. No LE edema Pulmonary: Tachypneic. Labored breathing on RA. Significant inspiratory and expiratory wheezing appreciated in all lung fields Abdominal:  Soft, nontender, nondistended. Normal bowel sounds. Extremities: Palpable radial and DP pulses. Normal ROM. S/p L 4th and 5th digit metatarsal amputation Skin: Warm and dry. No obvious rash or lesions. Neuro: A&Ox3. Moves all extremities. Normal sensation. No focal deficit. Psych: Normal mood and affect   EKG: Normal sinus rhythm with HR of 95 bpm   Assessment & Plan:   See Encounters Tab for problem based charting.  Patient seen with Dr. Evette Doffing

## 2021-07-03 NOTE — Telephone Encounter (Signed)
Pt reporting an allergic reaction to shrimp in his culinary class and was seen by the Ambulance at [facility] in [place] about a week ago at his home.  Pt state he ingested too many fumes in the cooking and had an allergic reaction.   Pt states he has been having chest pain afterwards x 1 week and is concerned.  Pt states he feels like sometimes he still can't  Please call the patient back.

## 2021-07-03 NOTE — Telephone Encounter (Signed)
Telephone 07/03/2021 Conversation: Advice Only (Newest Message First) July 03, 2021 Me   CB   11:14 AM Note   Pt reporting an allergic reaction to shrimp in his culinary class and was seen by the Ambulance at [facility] in [place] about a week ago at his home.  Pt state he ingested too many fumes in the cooking and had an allergic reaction.   Pt states he has been having chest pain afterwards x 1 week and is concerned.  Pt states he feels like sometimes he still can't   Please call the patient back.          11:05 AM [name] contacted Me

## 2021-07-03 NOTE — Patient Instructions (Signed)
Thank you, [name] for allowing [place] to provide your care today. Today we discussed: Asthma exacerbation: Your shortness of breath, wheezing, and chest tightness are likely because of an asthma exacerbation that was triggered by the fumes from your class. We gave you a steroid injection in the clinic and an albuterol nebulizer treatment, which should help get you feeling better. Please also take prednisone (steroid) 40 mg everyday for 5 days. If you are not feeling better after or start to feel worse, please call us back or go to the nearest emergency room.   I have ordered the following labs for you:  Lab Orders  No laboratory test(s) ordered today     Tests ordered today:  EKG  Referrals ordered today:   Referral Orders  No referral(s) requested today     I have ordered the following medication/changed the following medications:   Stop the following medications: There are no discontinued medications.   Start the following medications: Meds ordered this encounter  Medications   methylPREDNISolone sodium succinate (SOLU-MEDROL) 125 mg/2 mL injection 125 mg   albuterol (PROVENTIL) (2.5 MG/3ML) 0.083% nebulizer solution 2.5 mg   predniSONE (DELTASONE) 20 MG tablet    Sig: Take 2 tablets (40 mg total) by mouth daily with breakfast for 5 days.    Dispense:  10 tablet    Refill:  0     Follow up: 2 months (already scheduled appt with Dr. Heber Long)   Remember: If your chest pain starts to worsen and you are still having trouble breathing, call 911 and go to the nearest ER.   Should you have any questions or concerns please call the internal medicine clinic at [phone].     Buddy Duty, D.O. [place]

## 2021-07-03 NOTE — Telephone Encounter (Signed)
Call transferred to triage. 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 Ambulance was called and he was given an Albuterol tx. And ever since, he has been having a "tightness in my chest"; which occurs intermittently and not over bearing just noticeable. And wants to evaluated after his class today. Appt schedule for today@ 3704 PM with Dr Raymondo Band.

## 2021-07-04 NOTE — Progress Notes (Addendum)
Internal Medicine Clinic Attending  I saw and evaluated the patient.  I personally confirmed the key portions of the history and exam documented by Dr. Raymondo Band and I reviewed pertinent patient test results.  The assessment, diagnosis, and plan were formulated together and I agree with the documentation in the resident's note.   60 year old person with one week of intermittent chest tightness and shortness of breath. He has diffuse wheezing on exam. He is currently chest pain free, says the tightness comes and goes, not exertion related, but does get more short of air with exertion. I reviewed the EKG which has no ST changes, no TWI, no q waves. He does have modest CAD on last evaluation, on medical management with aspirin and atorvastatin. I think his symptoms are most likely an asthma exacerbation, moderate risk, rather than an ischemic event. We treated him with an albuterol neb treatment and IM solumedrol, will continue a course of oral prednisone.

## 2021-07-04 NOTE — Addendum Note (Signed)
Addended by: Lalla Brothers T on: 07/04/2021 09:40 AM   Modules accepted: Level of Service

## 2021-07-04 NOTE — Telephone Encounter (Signed)
Ok great, thank you. 

## 2021-07-25 ENCOUNTER — Ambulatory Visit: Payer: Medicare Other | Admitting: Podiatry

## 2021-07-25 ENCOUNTER — Ambulatory Visit (INDEPENDENT_AMBULATORY_CARE_PROVIDER_SITE_OTHER): Payer: Medicare Other | Admitting: Podiatry

## 2021-07-25 DIAGNOSIS — Z91199 Patient's noncompliance with other medical treatment and regimen due to unspecified reason: Secondary | ICD-10-CM

## 2021-08-01 LAB — HM DIABETES EYE EXAM

## 2021-08-04 ENCOUNTER — Telehealth: Payer: Self-pay | Admitting: Podiatry

## 2021-08-04 NOTE — Telephone Encounter (Signed)
Please call patient regarding status of his shoes

## 2021-08-07 ENCOUNTER — Telehealth: Payer: Self-pay | Admitting: Podiatry

## 2021-08-07 NOTE — Telephone Encounter (Signed)
Returned pts call from Friday about his diabetic shoes. I explained that we did not get the paperwork until 10.18 and the inserts are in production and should be shipping in a few weeks and I will call pt when they come in.

## 2021-08-08 ENCOUNTER — Ambulatory Visit: Payer: Medicare Other | Admitting: Podiatry

## 2021-08-08 ENCOUNTER — Other Ambulatory Visit: Payer: Self-pay

## 2021-08-08 MED ORDER — ALBUTEROL SULFATE HFA 108 (90 BASE) MCG/ACT IN AERS: INHALATION_SPRAY | RESPIRATORY_TRACT | 3 refills | Status: DC

## 2021-08-15 ENCOUNTER — Other Ambulatory Visit: Payer: Self-pay | Admitting: Student

## 2021-08-15 DIAGNOSIS — E1142 Type 2 diabetes mellitus with diabetic polyneuropathy: Secondary | ICD-10-CM

## 2021-08-15 NOTE — Telephone Encounter (Signed)
Dulaglutide (TRULICITY) 1.5 IT/6.4WX SOPN, REFILL REQUEST.

## 2021-08-17 ENCOUNTER — Encounter: Payer: Self-pay | Admitting: Internal Medicine

## 2021-08-17 ENCOUNTER — Other Ambulatory Visit: Payer: Self-pay

## 2021-08-17 ENCOUNTER — Ambulatory Visit (INDEPENDENT_AMBULATORY_CARE_PROVIDER_SITE_OTHER): Payer: Medicare Other | Admitting: Internal Medicine

## 2021-08-17 VITALS — BP 128/68 | HR 93 | Temp 98.2°F | Ht 74.0 in | Wt 229.8 lb

## 2021-08-17 DIAGNOSIS — I2510 Atherosclerotic heart disease of native coronary artery without angina pectoris: Secondary | ICD-10-CM

## 2021-08-17 DIAGNOSIS — Z23 Encounter for immunization: Secondary | ICD-10-CM

## 2021-08-17 DIAGNOSIS — E1151 Type 2 diabetes mellitus with diabetic peripheral angiopathy without gangrene: Secondary | ICD-10-CM | POA: Diagnosis not present

## 2021-08-17 DIAGNOSIS — R7989 Other specified abnormal findings of blood chemistry: Secondary | ICD-10-CM | POA: Insufficient documentation

## 2021-08-17 LAB — POCT GLYCOSYLATED HEMOGLOBIN (HGB A1C): Hemoglobin A1C: 9.9 % — AB (ref 4.0–5.6)

## 2021-08-17 LAB — GLUCOSE, CAPILLARY: Glucose-Capillary: 287 mg/dL — ABNORMAL HIGH (ref 70–99)

## 2021-08-17 MED ORDER — ATORVASTATIN CALCIUM 40 MG PO TABS: 40.0000 mg | ORAL_TABLET | Freq: Every day | ORAL | 3 refills | Status: DC

## 2021-08-17 MED ORDER — PIOGLITAZONE HCL 30 MG PO TABS: 30.0000 mg | ORAL_TABLET | Freq: Every day | ORAL | 3 refills | Status: DC

## 2021-08-17 NOTE — Patient Instructions (Signed)
I am going to send you over to the cardiologist to check back on your chest pain.  I want you to cut back on the carbohydrates and sugary drinks.  Let me know if the sugars do not come down.  Best of luck with your upcoming eye procedures!

## 2021-08-17 NOTE — Assessment & Plan Note (Signed)
Prior to his establishment on care with me in 2016 he was admitted to the hospital with chest pain. He had a cardaic catheterization with Dr Scarlette Calico which revealed mild CAD,  He following up with Kerin Ransom twice after that in 2017 and his chest pain was felt to be non cardiac.  Here recently he has started having intermittent substernal chest pressure, usually worse with ambulation and associated with some mild dyspnea.  Resolves with rest.  Seen in our clinic back in September and felt to have mild asthma exacerbation and treated with course of steroids and improved.  He overall has done well since 2016 with mostly excellent A1c controlled.

## 2021-08-17 NOTE — Assessment & Plan Note (Addendum)
He has been going to [employer] school at [employer] he likes that he has been busy however he notes that this is led to dietary indiscretion he has also been drinking more sugary drinks including cranberry juice.  Notice his sugars have been running a little higher and his A1c did come back at over 9%.  We discussed potentially increasing dose of Trulicity however he is confident he can retain glycemic control by reverting back to a better diabetic diet. Therefore I will continue him on Actos 30 mg daily Synjardy 12/5595 twice a day Trulicity 1.5 mg weekly

## 2021-08-17 NOTE — Assessment & Plan Note (Signed)
Elevated TSH incidentally found last hospitalization in March suspect that was due to his acute illness we will recheck thyroid labs today.

## 2021-08-17 NOTE — Progress Notes (Signed)
  Subjective:  HPI: Richard [name] is a 60 y.o. male who presents for f/u DM and HTN  Please see Assessment and Plan below for the status of his chronic medical problems.  Objective:  Physical Exam: Vitals:   08/17/21 0853  BP: 128/68  Pulse: 93  Temp: 98.2 F (36.8 C)  TempSrc: Oral  SpO2: 99%  Weight: 229 lb 12.8 oz (104.2 kg)  Height: 6\' 2"  (1.88 m)   Body mass index is 29.5 kg/m. Physical Exam Vitals and nursing note reviewed.  Constitutional:      Appearance: Normal appearance.  Cardiovascular:     Rate and Rhythm: Normal rate and regular rhythm.  Pulmonary:     Effort: Pulmonary effort is normal.     Breath sounds: Normal breath sounds.  Abdominal:     Palpations: Abdomen is soft.  Musculoskeletal:     Right lower leg: No edema.     Left lower leg: No edema.  Neurological:     Mental Status: He is alert.  Psychiatric:        Mood and Affect: Mood normal.   Assessment & Plan:  See Encounters Tab for problem based charting.  Medications Ordered Meds ordered this encounter  Medications   pioglitazone (ACTOS) 30 MG tablet    Sig: Take 1 tablet (30 mg total) by mouth daily.    Dispense:  90 tablet    Refill:  3    Place on file.   atorvastatin (LIPITOR) 40 MG tablet    Sig: Take 1 tablet (40 mg total) by mouth daily.    Dispense:  90 tablet    Refill:  3   Other Orders Orders Placed This Encounter  Procedures   Flu Vaccine QUAD 43mo+IM (Fluarix, Fluzone & Alfiuria Quad PF)   TSH   T4, Free   T3   CMP14 + Anion Gap   Glucose, capillary   Ambulatory referral to Cardiology    Referral Priority:   Routine    Referral Type:   Consultation    Referral Reason:   Specialty Services Required    Requested Specialty:   Cardiology    Number of Visits Requested:   1   POC Hbg A1C   Follow Up: Return in about 3 months (around 11/17/2021).

## 2021-08-18 LAB — CMP14 + ANION GAP
ALT: 22 IU/L (ref 0–44)
AST: 20 IU/L (ref 0–40)
Albumin/Globulin Ratio: 1.7 (ref 1.2–2.2)
Albumin: 4.3 g/dL (ref 3.8–4.8)
Alkaline Phosphatase: 119 IU/L (ref 44–121)
Anion Gap: 14 mmol/L (ref 10.0–18.0)
BUN/Creatinine Ratio: 15 (ref 10–24)
BUN: 14 mg/dL (ref 8–27)
Bilirubin Total: <0.2 mg/dL (ref 0.0–1.2)
CO2: 20 mmol/L (ref 20–29)
Calcium: 9.4 mg/dL (ref 8.6–10.2)
Chloride: 99 mmol/L (ref 96–106)
Creatinine, Ser: 0.93 mg/dL (ref 0.76–1.27)
Globulin, Total: 2.5 g/dL (ref 1.5–4.5)
Glucose: 319 mg/dL — ABNORMAL HIGH (ref 70–99)
Potassium: 4.8 mmol/L (ref 3.5–5.2)
Sodium: 133 mmol/L — ABNORMAL LOW (ref 134–144)
Total Protein: 6.8 g/dL (ref 6.0–8.5)
eGFR: 93 mL/min/{1.73_m2} (ref >59)

## 2021-08-18 LAB — T3: T3, Total: 99 ng/dL (ref 71–180)

## 2021-08-18 LAB — T4, FREE: Free T4: 1.3 ng/dL (ref 0.82–1.77)

## 2021-08-18 LAB — TSH: TSH: 1.91 u[IU]/mL (ref 0.450–4.500)

## 2021-09-06 ENCOUNTER — Encounter: Payer: Self-pay | Admitting: Dietician

## 2021-09-08 ENCOUNTER — Other Ambulatory Visit: Payer: Self-pay | Admitting: Internal Medicine

## 2021-09-08 DIAGNOSIS — E1140 Type 2 diabetes mellitus with diabetic neuropathy, unspecified: Secondary | ICD-10-CM

## 2021-09-08 MED ORDER — PREGABALIN 100 MG PO CAPS
100.0000 mg | ORAL_CAPSULE | Freq: Three times a day (TID) | ORAL | 2 refills | Status: DC
Start: 2021-09-08 — End: 2022-03-08

## 2021-09-08 NOTE — Progress Notes (Signed)
Patient requesting refill on lyrica.

## 2021-09-11 ENCOUNTER — Telehealth: Payer: Self-pay | Admitting: Podiatry

## 2021-09-11 NOTE — Telephone Encounter (Signed)
Patient called and wanted to follow up on diabetic shoes. He was casted on 9/13/ Please give patient a call a asap

## 2021-09-11 NOTE — Telephone Encounter (Signed)
Called pt and left message that when the shoes came in the authorization had expired but upon checking his chart pt was seen by pcp recently so I refaxed the paperwork with a note to get it updated(11.29) and we have not received it back yet. We have to have that updated to dispense the shoes as medicare will not cover them without the documents. On the message I suggested pt call the pcp.

## 2021-09-12 NOTE — Telephone Encounter (Signed)
Pt called back and apologized that he missed our call but was asking about his diabetic shoes. I explained that we got them in but when they came in the authorization had expired. I did fax the documents with a note to the pcp on 11.29 asking them to resign and fax back to me. Pt is calling pcp to follow up.

## 2021-09-13 ENCOUNTER — Ambulatory Visit (HOSPITAL_COMMUNITY)
Admission: RE | Admit: 2021-09-13 | Discharge: 2021-09-13 | Disposition: A | Discharge status: Home / Self Care | Payer: Medicare Other | Source: Ambulatory Visit | Attending: Internal Medicine | Admitting: Internal Medicine

## 2021-09-13 ENCOUNTER — Encounter: Payer: Self-pay | Admitting: Internal Medicine

## 2021-09-13 ENCOUNTER — Telehealth: Payer: Self-pay | Admitting: *Deleted

## 2021-09-13 ENCOUNTER — Other Ambulatory Visit: Payer: Self-pay

## 2021-09-13 ENCOUNTER — Ambulatory Visit (INDEPENDENT_AMBULATORY_CARE_PROVIDER_SITE_OTHER): Payer: Medicare Other | Admitting: Internal Medicine

## 2021-09-13 VITALS — BP 135/74 | HR 98 | Temp 98.5°F

## 2021-09-13 DIAGNOSIS — R0789 Other chest pain: Secondary | ICD-10-CM | POA: Insufficient documentation

## 2021-09-13 DIAGNOSIS — J069 Acute upper respiratory infection, unspecified: Secondary | ICD-10-CM

## 2021-09-13 MED ORDER — ACETAMINOPHEN 500 MG PO TABS: 1000.0000 mg | ORAL_TABLET | Freq: Four times a day (QID) | ORAL | 0 refills | Status: AC | PRN

## 2021-09-13 MED ORDER — BENZONATATE 100 MG PO CAPS
100.0000 mg | ORAL_CAPSULE | Freq: Three times a day (TID) | ORAL | 1 refills | Status: AC | PRN
Start: 2021-09-13 — End: 2021-09-25

## 2021-09-13 NOTE — Telephone Encounter (Signed)
Patient called in stating he's been sick for 3 days. States he almost "fell out" in church on Sunday. EMS came and checked VS. BP was elevated. Has had fever of 100, cough, congestion, body aches, non-radiating CP. Patient refusing to go to ED 2/2 financial reasons. Appt given today at 1:45 with [facility] Team. He understands if he develops SHOB at rest or CP that radiates to head directly to ED.

## 2021-09-13 NOTE — Patient Instructions (Signed)
Thank you, Mr.Navarro Plucinski for allowing [place] to provide your care today. Today we discussed upper respiratory viral illness and chest pain.    Labs/Tests Ordered:  Lab Orders         COVID-19, Flu A+B and RSV      Referrals Ordered:  Referral Orders  No referral(s) requested today     Medication Changes:  There are no discontinued medications.   Meds ordered this encounter  Medications   benzonatate (TESSALON PERLES) 100 MG capsule    Sig: Take 1 capsule (100 mg total) by mouth 3 (three) times daily as needed for up to 12 days for cough.    Dispense:  30 capsule    Refill:  1   acetaminophen (TYLENOL) 500 MG tablet    Sig: Take 2 tablets (1,000 mg total) by mouth every 6 (six) hours as needed for up to 12 days for fever.    Dispense:  56 tablet    Refill:  0     Health Maintenance Screening: Diabetes Health Maintenance Due  Topic Date Due   URINE MICROALBUMIN  02/17/2021   HEMOGLOBIN A1C  11/17/2021   FOOT EXAM  12/01/2021   OPHTHALMOLOGY EXAM  08/01/2022     Instructions:  - Please take Mucinex (guaifenesin) for nasal congestion/runny nose -Tylenol 500-650 mg every 6 hours for fever and body -Benzonatate that I had PRESCRIBED - Albuterol inhaler for shortness of breath - Please stay home from work activities 5 days from the start of your symptoms then you can go around other people if you are wearing a mask.  - Please call the clinic and/or go to the ED if you develop worsening shortness of breath.   Follow up:  as needed.    Remember: If you have any questions or concerns, call our clinic at [phone] or after hours call [phone] and ask for the internal medicine resident on call.  Marianna Payment, D.O. [place]

## 2021-09-14 ENCOUNTER — Encounter: Payer: Self-pay | Admitting: Internal Medicine

## 2021-09-14 DIAGNOSIS — J069 Acute upper respiratory infection, unspecified: Secondary | ICD-10-CM | POA: Insufficient documentation

## 2021-09-14 LAB — COVID-19, FLU A+B AND RSV
Influenza A, NAA: NOT DETECTED
Influenza B, NAA: NOT DETECTED
RSV, NAA: NOT DETECTED
SARS-CoV-2, NAA: NOT DETECTED

## 2021-09-14 NOTE — Progress Notes (Signed)
Subjective:  CC: URI  HPI:  [name] is a 60 y.o. male with a past medical history stated below and presents today for URI. Please see problem based assessment and plan for additional details.  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 75 mL 11   albuterol (VENTOLIN HFA) 108 (90 Base) MCG/ACT inhaler INHALE 2 PUFFS INTO THE LUNGS EVERY 4 HOURS AS NEEDED FOR WHEEZING OR SHORTNESS OF BREATH 18 g 3   ascorbic acid (VITAMIN C) 500 MG tablet Take 500 mg by mouth daily.     aspirin EC 81 MG EC tablet Take 1 tablet (81 mg total) by mouth daily. 30 tablet 3   atorvastatin (LIPITOR) 40 MG tablet Take 1 tablet (40 mg total) by mouth daily. 90 tablet 3   Blood Glucose Monitoring Suppl (ACCU-CHEK GUIDE) w/Device KIT USE TO CHECK BLOOD SUGAR TWICE DAILY 1 kit 0   diclofenac sodium  (VOLTAREN) 1 % GEL Apply 4 g topically 4 (four) times daily as needed. (Patient taking differently: Apply 4 g topically daily.) 100 g 5   Empagliflozin-metFORMIN HCl (SYNJARDY) 02-999 MG TABS Take 1 tablet by mouth 2 (two) times daily. 180 tablet 3   glucose blood (ACCU-CHEK GUIDE) test strip USE TO CHECK BLOOD SUGARS TWICE DAILY 200 strip 5   ibuprofen (ADVIL) 200 MG tablet Take 200 mg by mouth every 6 (six) hours as needed for mild pain.     ibuprofen (ADVIL,MOTRIN) 600 MG tablet Take 1 tablet (600 mg total) by mouth every 6 (six) hours as needed. 30 tablet 0   Lancets Misc. (UNISTIK 2 NORMAL) MISC Check blood sugar two times a day     latanoprost (XALATAN) 0.005 % ophthalmic solution Place 1 drop into both eyes at bedtime.      levocetirizine (XYZAL) 5 MG tablet Take 1 tablet (5 mg total) by mouth every evening. 90 tablet 3   mometasone-formoterol (DULERA) 200-5 MCG/ACT AERO Inhale 2 puffs into the lungs 2 (two) times daily. 13 g 3   montelukast (SINGULAIR) 10 MG tablet Take 1 tablet (10 mg total) by mouth at bedtime. 90 tablet 3   mupirocin ointment (BACTROBAN) 2 % Apply 1 application topically 2 (two) times daily.  30 g 2   nitroGLYCERIN (NITRODUR - DOSED IN MG/24 HR) 0.2 mg/hr patch Place 1 patch (0.2 mg total) onto the skin daily. 30 patch 12   pentoxifylline (TRENTAL) 400 MG CR tablet Take 1 tablet (400 mg total) by mouth 3 (three) times daily with meals. 90 tablet 3   pioglitazone (ACTOS) 30 MG tablet Take 1 tablet (30 mg total) by mouth daily. 90 tablet 3   pregabalin (LYRICA) 100 MG capsule Take 1 capsule (100 mg total) by mouth 3 (three) times daily. 90 capsule 2   SSD 1 % cream APPLY TOPICALLY TO THE AFFECTED AREA DAILY 50 g 0   triamcinolone ointment (KENALOG) 0.1 % Apply 1 application topically 2 (two) times daily as needed. 80 g 5   TRULICITY 1.5 BJ/6.2GB SOPN INJECT 0.5MLS(1.5MG ) INTO THE SKIN ONCE A WEEK 12 mL 1   No current facility-administered medications on file prior to  visit.    Family History  Problem Relation Age of Onset   Hypertension Mother    Hypertension Sister    Glaucoma Sister    Colon cancer Neg Hx    Colon polyps Neg Hx    Esophageal cancer Neg Hx    Rectal cancer Neg Hx    Stomach cancer Neg Hx     Social History   Socioeconomic History   Marital status: Single    Spouse name: Not on file   Number of children: Not on file   Years of education: Not on file   Highest education level: Not on file  Occupational History   Not on file  Tobacco Use   Smoking status: Former    Packs/day: 0.50    Years: 45.00    Pack years: 22.50    Types: Cigarettes    Quit date: 01/12/2018    Years since quitting: 3.6   Smokeless tobacco: Never  Vaping Use   Vaping Use: Never used  Substance and Sexual Activity   Alcohol use: Not Currently    Alcohol/week: 0.0 standard drinks    Comment: 08/24/2019 "nothing since <2010"   Drug use: Not Currently    Types: Cocaine    Comment: 10/23/2018 "nothing since <1990"   Sexual activity: Not on file  Other Topics Concern   Not on file  Social History Narrative   Currently working on obtaining GED from [employer]- July 2018   Social Determinants of Health   Financial Resource Strain: Not on file  Food Insecurity: Not on file  Transportation Needs: Not on file  Physical Activity: Not on file  Stress: Not on file  Social Connections: Not on file  Intimate Partner Violence: Not on file    Review of Systems: ROS negative except for what is noted on the assessment and plan.  Objective:   Vitals:   09/13/21 1321  BP: 135/74  Pulse: 98  Temp: 98.5 F (36.9 C)  TempSrc: Oral  SpO2: 100%    Physical Exam: Gen: A&O x3 and in no apparent distress, well appearing and nourished. HEENT:    Head - normocephalic, atraumatic.    Eye - visual acuity grossly intact, conjunctiva clear, sclera non-icteric, EOM intact.    Mouth - No obvious caries or periodontal disease.   Nose - scant mucus and  erythema Neck: no masses or nodules, AROM intact. CV: RRR, no murmurs, S1/S2 presents, no chest wall TTP or crepitous  Resp: Clear to ascultation bilaterally  Abd: BS (+) x4, soft, non-tender abdomen, without hepatosplenomegaly or masses MSK: Grossly  normal AROM and strength x4 extremities. Skin: good skin turgor, no rashes, unusual bruising, or prominent lesions.  Neuro: No focal deficits, grossly normal sensation and coordination.     Assessment & Plan:  See Encounters Tab for problem based charting.  Patient discussed with Dr.  Lacretia Nicks, D.O. [facility]  PGY-3 Pager: [phone]  Phone: [phone] Date 09/14/2021  Time 5:58 AM

## 2021-09-14 NOTE — Assessment & Plan Note (Addendum)
HPI: Patient presents with a 4 day history of subjective fevers, myalgias, and chill. He endorses associated cough with sputum, dizziness, occasional n/v/d, nasal congestion, and chest pain. The chest pain is right sided and described as tightness. He states that the chest pain is chronic without aggravating factors such as exertion, palpation, or inspiration. He states that he has had some difficulty with eating/drinking as he has lost he sense of taste and smell.  He denies any sick contacts up, recent travel, or changes in medications. He does have a history of COPD but does not have evidence of COPD exacerbation and he is not hypoxic/in respiratory distress on exam.   EKG was performed without significant changes from prior study.   Assessment/Plan: Upper respiratory viral infection, likely Influenza versus COVID19 - Viral swab performed today - Counseled regarding OTC medications to control his symptoms. - Prescribed cough tessalon pearls for cough - Gave strict return precaution regarding SHOB and chest pain

## 2021-09-15 ENCOUNTER — Other Ambulatory Visit: Payer: Self-pay

## 2021-09-15 ENCOUNTER — Ambulatory Visit: Payer: Medicare Other

## 2021-09-15 ENCOUNTER — Ambulatory Visit (INDEPENDENT_AMBULATORY_CARE_PROVIDER_SITE_OTHER): Payer: Medicare Other | Admitting: Podiatry

## 2021-09-15 DIAGNOSIS — B351 Tinea unguium: Secondary | ICD-10-CM

## 2021-09-15 DIAGNOSIS — M21961 Unspecified acquired deformity of right lower leg: Secondary | ICD-10-CM

## 2021-09-15 DIAGNOSIS — Z89422 Acquired absence of other left toe(s): Secondary | ICD-10-CM

## 2021-09-15 DIAGNOSIS — M2042 Other hammer toe(s) (acquired), left foot: Secondary | ICD-10-CM

## 2021-09-15 DIAGNOSIS — E08621 Diabetes mellitus due to underlying condition with foot ulcer: Secondary | ICD-10-CM

## 2021-09-15 DIAGNOSIS — E1142 Type 2 diabetes mellitus with diabetic polyneuropathy: Secondary | ICD-10-CM | POA: Diagnosis not present

## 2021-09-15 DIAGNOSIS — L97411 Non-pressure chronic ulcer of right heel and midfoot limited to breakdown of skin: Secondary | ICD-10-CM | POA: Diagnosis not present

## 2021-09-15 DIAGNOSIS — E11621 Type 2 diabetes mellitus with foot ulcer: Secondary | ICD-10-CM

## 2021-09-15 DIAGNOSIS — E1169 Type 2 diabetes mellitus with other specified complication: Secondary | ICD-10-CM

## 2021-09-15 DIAGNOSIS — E1140 Type 2 diabetes mellitus with diabetic neuropathy, unspecified: Secondary | ICD-10-CM

## 2021-09-15 DIAGNOSIS — M2041 Other hammer toe(s) (acquired), right foot: Secondary | ICD-10-CM

## 2021-09-15 MED ORDER — SILVER SULFADIAZINE 1 % EX CREA: TOPICAL_CREAM | CUTANEOUS | 0 refills | Status: DC

## 2021-09-15 NOTE — Progress Notes (Signed)
  Subjective:  Patient ID: Richard [name], male    DOB: September 17, 1961,  MRN: [id number]  Chief Complaint  Patient presents with   Nail Problem    Thick painful toenails, 3 month follow   Diabetes   60 y.o. male presents with the above complaint. History confirmed with patient. Has recurrence of right foot ulcer states it has been bleeding. Also due for picking up his shoes today. Denies other pedal issues.  PCP: [name], DO; last seen 08/17/21  Objective:  Physical Exam: warm, good capillary refill, nail exam onychomycosis of the toenails, no trophic changes or ulcerative lesions. DP pulses palpable, PT pulses palpable, and protective sensation absent Left Foot: partial 4th/th ray resection noted  Right Foot: HPK submet 2 with 0.5 cm granular ulceration no warmth, no erythema, no SOI.  Hemoglobin A1C  Date Value Ref Range Status  08/17/2021 9.9 (A) 4.0 - 5.6 % Final   Hgb A1c MFr Bld  Date Value Ref Range Status  12/07/2020 7.2 (H) 4.8 - 5.6 % Final    Comment:    (NOTE) Pre diabetes:          5.7%-6.4%  Diabetes:              >6.4%  Glycemic control for   <7.0% adults with diabetes    No images are attached to the encounter.  Assessment:   1. Diabetic ulcer of right midfoot associated with type 2 diabetes mellitus, limited to breakdown of skin ([place])   2. Onychomycosis of multiple toenails with type 2 diabetes mellitus and peripheral neuropathy ([place])    Plan:  Patient was evaluated and treated and all questions answered.  Onychomycosis -Patient is diabetic with a qualifying condition for at risk foot care.  Procedure: Nail Debridement Type of Debridement: manual, sharp debridement. Instrumentation: Nail nipper, rotary burr. Number of Nails: 7  DM Foot ulcer right 2/2 metatarsal deformity. -Wound cleansed and debrided -Will offload with DM shoes that are due to be dispensed today. -Should this persist and A1c get under control can consider met osteotomy to  promote healing of the ulcer. Currently 9.9, was previously   Procedure: Excisional Debridement of Wound Indication: Removal of non-viable soft tissue from the wound to promote healing.  Anesthesia: none Pre-Debridement Wound Measurements: overlying HPK  Post-Debridement Wound Measurements: 0.5 cm diameter Type of Debridement: Sharp Excisional Tissue Removed: Non-viable soft tissue Instrumentation: 15 blade and tissue nipper Depth of Debridement: subcutaneous tissue. Technique: Sharp excisional debridement to bleeding, viable wound base.  Dressing: Dry, sterile, compression dressing. Disposition: Patient tolerated procedure well.

## 2021-09-15 NOTE — Progress Notes (Signed)
SITUATION Reason for Visit: Fitting of Diabetic Shoes & Insoles Patient / Caregiver Report:  Patient is thrilled with shoes  OBJECTIVE DATA: Patient History / Diagnosis:  Diabetic ulcer of right foot associated with diabetes mellitus due to underlying condition, limited to breakdown of skin, unspecified part of foot ([place])  Change in Status:   None  ACTIONS PERFORMED: In-Person Delivery, patient was fit with: - 1x pair A5500 PDAC approved prefabricated Diabetic Shoes: APEX B5000M 14W - 3x pair [id number] PDAC approved CAM milled custom diabetic insoles  Shoes and insoles were verified for structural integrity and safety. Patient wore shoes and insoles in office. Skin was inspected and free of areas of concern after wearing shoes and inserts. Shoes and inserts fit properly. Patient / Caregiver provided with ferbal instruction and demonstration regarding donning, doffing, wear, care, proper fit, function, purpose, cleaning, and use of shoes and insoles ' and in all related precautions and risks and benefits regarding shoes and insoles. Patient / Caregiver was instructed to wear properly fitting socks with shoes at all times. Patient was also provided with verbal instruction regarding how to report any failures or malfunctions of shoes or inserts, and necessary follow up care. Patient / Caregiver was also instructed to contact physician regarding change in status that may affect function of shoes and inserts.   Patient / Caregiver verbalized undersatnding of instruction provided. Patient / Caregiver demonstrated independence with proper donning and doffing of shoes and inserts.  PLAN Patient to follow up as needed. Plan of care was discussed with and agreed upon by patient and/or caregiver. All questions were answered and concerns addressed.

## 2021-09-18 ENCOUNTER — Encounter: Payer: Self-pay | Admitting: Interventional Cardiology

## 2021-09-18 ENCOUNTER — Telehealth (HOSPITAL_COMMUNITY): Payer: Self-pay | Admitting: *Deleted

## 2021-09-18 ENCOUNTER — Encounter: Payer: Self-pay | Admitting: *Deleted

## 2021-09-18 ENCOUNTER — Ambulatory Visit (INDEPENDENT_AMBULATORY_CARE_PROVIDER_SITE_OTHER): Payer: Medicare Other | Admitting: Interventional Cardiology

## 2021-09-18 ENCOUNTER — Other Ambulatory Visit: Payer: Self-pay

## 2021-09-18 VITALS — BP 124/70 | HR 100 | Ht 74.0 in | Wt 229.6 lb

## 2021-09-18 DIAGNOSIS — E782 Mixed hyperlipidemia: Secondary | ICD-10-CM

## 2021-09-18 DIAGNOSIS — I25118 Atherosclerotic heart disease of native coronary artery with other forms of angina pectoris: Secondary | ICD-10-CM | POA: Diagnosis not present

## 2021-09-18 DIAGNOSIS — E1159 Type 2 diabetes mellitus with other circulatory complications: Secondary | ICD-10-CM

## 2021-09-18 DIAGNOSIS — I359 Nonrheumatic aortic valve disorder, unspecified: Secondary | ICD-10-CM | POA: Diagnosis not present

## 2021-09-18 NOTE — Patient Instructions (Signed)
Medication Instructions:  Your physician recommends that you continue on your current medications as directed. Please refer to the Current Medication list given to you today.  *If you need a refill on your cardiac medications before your next appointment, please call your pharmacy*   Lab Work: none If you have labs (blood work) drawn today and your tests are completely normal, you will receive your results only by: [place] (if you have MyChart) OR A paper copy in the mail If you have any lab test that is abnormal or we need to change your treatment, we will call you to review the results.   Testing/Procedures: Your physician has requested that you have an echocardiogram. Echocardiography is a painless test that uses sound waves to create images of your heart. It provides your doctor with information about the size and shape of your heart and how well your heart's chambers and valves are working. This procedure takes approximately one hour. There are no restrictions for this procedure.  Your physician has requested that you have an exercise stress myoview. For further information please visit [email or web address]. Please follow instruction sheet, as given.    Follow-Up: At [facility], you and your health needs are our priority.  As part of our continuing mission to provide you with exceptional heart care, we have created designated Provider Care Teams.  These Care Teams include your primary Cardiologist (physician) and Advanced Practice Providers (APPs -  Physician Assistants and Nurse Practitioners) who all work together to provide you with the care you need, when you need it.  We recommend signing up for the patient portal called "MyChart".  Sign up information is provided on this After Visit Summary.  MyChart is used to connect with patients for Virtual Visits (Telemedicine).  Patients are able to view lab/test results, encounter notes, upcoming appointments, etc.  Non-urgent  messages can be sent to your provider as well.   To learn more about what you can do with MyChart, go to [email or web address].    Your next appointment:   12 month(s)  The format for your next appointment:   In Person  Provider:   Larae Grooms, MD     Other Instructions

## 2021-09-18 NOTE — Telephone Encounter (Signed)
Patient given detailed instructions per Myocardial Perfusion Study Information Sheet for the test on 09/25/21 at 8:00. Patient notified to arrive 15 minutes early and that it is imperative to arrive on time for appointment to keep from having the test rescheduled.  If you need to cancel or reschedule your appointment, please call the office within 24 hours of your appointment. . Patient verbalized understanding.Richard [name]
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
## 2021-09-18 NOTE — Progress Notes (Signed)
Internal Medicine Clinic Attending  Case discussed with Dr. Coe  At the time of the visit.  We reviewed the resident's history and exam and pertinent patient test results.  I agree with the assessment, diagnosis, and plan of care documented in the resident's note.  

## 2021-09-19 ENCOUNTER — Encounter: Payer: Self-pay | Admitting: Internal Medicine

## 2021-09-25 ENCOUNTER — Encounter (HOSPITAL_COMMUNITY): Payer: Medicare Other

## 2021-09-29 ENCOUNTER — Telehealth (HOSPITAL_COMMUNITY): Payer: Self-pay | Admitting: *Deleted

## 2021-09-29 NOTE — Telephone Encounter (Signed)
Patient given detailed instructions per Myocardial Perfusion Study Information Sheet for the test on 10/06/21 at 7:45. Patient notified to arrive 15 minutes early and that it is imperative to arrive on time for appointment to keep from having the test rescheduled.  If you need to cancel or reschedule your appointment, please call the office within 24 hours of your appointment. . Patient verbalized understanding.Richard [name]

## 2021-10-06 ENCOUNTER — Other Ambulatory Visit: Payer: Self-pay

## 2021-10-06 ENCOUNTER — Encounter (HOSPITAL_COMMUNITY): Payer: Self-pay

## 2021-10-06 ENCOUNTER — Ambulatory Visit (HOSPITAL_COMMUNITY): Payer: Medicare Other | Attending: Cardiovascular Disease

## 2021-10-06 ENCOUNTER — Ambulatory Visit (HOSPITAL_BASED_OUTPATIENT_CLINIC_OR_DEPARTMENT_OTHER): Payer: Medicare Other

## 2021-10-06 DIAGNOSIS — I359 Nonrheumatic aortic valve disorder, unspecified: Secondary | ICD-10-CM | POA: Diagnosis not present

## 2021-10-06 DIAGNOSIS — I25118 Atherosclerotic heart disease of native coronary artery with other forms of angina pectoris: Secondary | ICD-10-CM

## 2021-10-06 LAB — MYOCARDIAL PERFUSION IMAGING
LV dias vol: 100 mL (ref 62–150)
LV sys vol: 43 mL
Nuc Stress EF: 56 %
Peak HR: 120 {beats}/min
Rest HR: 98 {beats}/min
Rest Nuclear Isotope Dose: 10.5 mCi
SDS: 0
SRS: 0
SSS: 0
ST Depression (mm): 0 mm
Stress Nuclear Isotope Dose: 30.4 mCi
TID: 1.03

## 2021-10-06 LAB — ECHOCARDIOGRAM COMPLETE
AR max vel: 1.53 cm2
AV Area VTI: 1.6 cm2
AV Area mean vel: 1.58 cm2
AV Mean grad: 12 mmHg
AV Peak grad: 21.9 mmHg
Ao pk vel: 2.34 m/s
Area-P 1/2: 3.01 cm2
Height: 72 in
S' Lateral: 3.4 cm
Weight: 3664 oz

## 2021-10-06 MED ORDER — REGADENOSON 0.4 MG/5ML IV SOLN
0.4000 mg | Freq: Once | INTRAVENOUS | Status: AC
  Administered 2021-10-06: 10:00:00 0.4 mg via INTRAVENOUS

## 2021-10-06 MED ORDER — TECHNETIUM TC 99M TETROFOSMIN IV KIT
30.4000 | PACK | Freq: Once | INTRAVENOUS | Status: AC | PRN
  Administered 2021-10-06: 30.4 via INTRAVENOUS
  Filled 2021-10-06: qty 31

## 2021-10-06 MED ORDER — TECHNETIUM TC 99M TETROFOSMIN IV KIT
10.5000 | PACK | Freq: Once | INTRAVENOUS | Status: AC | PRN
  Administered 2021-10-06: 10.5 via INTRAVENOUS
  Filled 2021-10-06: qty 11

## 2021-10-17 ENCOUNTER — Other Ambulatory Visit: Payer: Self-pay

## 2021-10-17 ENCOUNTER — Ambulatory Visit (INDEPENDENT_AMBULATORY_CARE_PROVIDER_SITE_OTHER): Payer: Commercial Managed Care - HMO | Admitting: Podiatry

## 2021-10-17 DIAGNOSIS — E1140 Type 2 diabetes mellitus with diabetic neuropathy, unspecified: Secondary | ICD-10-CM | POA: Diagnosis not present

## 2021-10-17 DIAGNOSIS — M21961 Unspecified acquired deformity of right lower leg: Secondary | ICD-10-CM | POA: Diagnosis not present

## 2021-10-17 DIAGNOSIS — E08621 Diabetes mellitus due to underlying condition with foot ulcer: Secondary | ICD-10-CM

## 2021-10-17 DIAGNOSIS — L97511 Non-pressure chronic ulcer of other part of right foot limited to breakdown of skin: Secondary | ICD-10-CM

## 2021-10-17 NOTE — Progress Notes (Signed)
°  Subjective:  Patient ID: Richard [name], male    DOB: Apr 07, 1961,  MRN: [id number]  Chief Complaint  Patient presents with   Wound Check    Diabetic ulcer of right midfoot associated with type 2 diabetes mellitus, limited to breakdown of skin ([place])   2. Onychomycosis of multiple toenails with type 2 diabetes mellitus and peripheral neuropathy ([place])      61 y.o. male presents with the above complaint. History confirmed with patient. Doing much better thinks the shoes are taking pressure off of it has only noticed occasional blood, but persistent callus.  PCP: [name], DO; last seen 08/17/21  Objective:  Physical Exam: warm, good capillary refill, nail exam onychomycosis of the toenails, no trophic changes or ulcerative lesions. DP pulses palpable, PT pulses palpable, and protective sensation absent Left Foot: partial 4th/th ray resection noted  Right Foot: HPK submet 2 with healed ulceration no warmth, no erythema, no SOI.  Hemoglobin A1C  Date Value Ref Range Status  08/17/2021 9.9 (A) 4.0 - 5.6 % Final   Hgb A1c MFr Bld  Date Value Ref Range Status  12/07/2020 7.2 (H) 4.8 - 5.6 % Final    Comment:    (NOTE) Pre diabetes:          5.7%-6.4%  Diabetes:              >6.4%  Glycemic control for   <7.0% adults with diabetes    No images are attached to the encounter.  Assessment:   1. 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 Metatarsal deformity, right   3. Diabetic neuropathy, painful ([place])     Plan:  Patient was evaluated and treated and all questions answered.   DM Foot ulcer right 2/2 metatarsal deformity. - Wound essentially healed. Debrided only of callus today. Slight bleed cauterized with silver nitrate and dressed with band-aid. Again discussed should his A1c get below 8 we can consider metatarsal osteotomy, but this would need to be  under 8 to consider doing so.

## 2021-10-23 ENCOUNTER — Other Ambulatory Visit: Payer: Self-pay | Admitting: Internal Medicine

## 2021-10-24 ENCOUNTER — Other Ambulatory Visit: Payer: Self-pay

## 2021-10-24 ENCOUNTER — Other Ambulatory Visit: Payer: Self-pay | Admitting: *Deleted

## 2021-10-24 MED ORDER — PROAIR HFA 108 (90 BASE) MCG/ACT IN AERS: 1.0000 | INHALATION_SPRAY | RESPIRATORY_TRACT | 5 refills | Status: DC | PRN

## 2021-10-24 NOTE — Telephone Encounter (Signed)
albuterol (VENTOLIN HFA) 108 (90 Base) MCG/ACT inhaler, refill request @ [place], [place] AT [facility].

## 2021-10-24 NOTE — Telephone Encounter (Signed)
Called pt - stated the pharmacy told him there was no more refills on Albuterol inh. It was refilled 08/2021 x 3 RF's.  I called [facility] - who stated pt is out of refills. It was refilled 09/11/21, 09/25/21 and 10/09/21.  I called the pt back and informed him of the above. He stated he calls the pharmacy to see if it's time and they tells him yes. Stated he uses 2 puffs 2 -3 times a day. Stated he would like Dr Heber Chesterton to call him. He's currently out of medication.

## 2021-10-24 NOTE — Telephone Encounter (Signed)
Reports the Yellow albuterol inhaler has not been working as well (Proventil) and that the Red albuterol worked better [employer]) will try to change back to ProAir to see if this helps.  Also would be good to schedule him a follow up visit with me in Feb or March

## 2021-10-24 NOTE — Telephone Encounter (Signed)
Patient called back, states he is using his inhaler BID and PRN. States he is completely out and requesting this refill today.

## 2021-10-25 ENCOUNTER — Encounter: Payer: Self-pay | Admitting: *Deleted

## 2021-10-25 MED ORDER — ALBUTEROL SULFATE HFA 108 (90 BASE) MCG/ACT IN AERS: 1.0000 | INHALATION_SPRAY | RESPIRATORY_TRACT | 6 refills | Status: DC | PRN

## 2021-10-25 NOTE — Telephone Encounter (Signed)
S been trying to get his medication refilled but, the pharmacy states that are unable to fill the last Medication called in (Pro-Air).  Patient states he just called the pharmacy again.

## 2021-10-25 NOTE — Telephone Encounter (Signed)
Richard [name] resent, he says he will make due with the generic.

## 2021-10-25 NOTE — Telephone Encounter (Signed)
Called [facility] - the pharmacist stated ProAir has been on back-order for a while so they are unable to get it. He stated Albuterol generic is available - and he stated 2 puffs every 4 hrs is a 15 day supply. Pt called and informed new [facility] [employer]) is unavailable and his doctor will be informed.

## 2021-10-25 NOTE — Telephone Encounter (Signed)
Called pt MA:[id number] rx but no answer; left message.

## 2021-11-28 ENCOUNTER — Ambulatory Visit: Payer: Commercial Managed Care - HMO | Admitting: Podiatry

## 2021-11-29 ENCOUNTER — Other Ambulatory Visit: Payer: Self-pay

## 2021-11-29 ENCOUNTER — Ambulatory Visit (INDEPENDENT_AMBULATORY_CARE_PROVIDER_SITE_OTHER): Payer: Medicare Other | Admitting: Podiatry

## 2021-11-29 DIAGNOSIS — M21961 Unspecified acquired deformity of right lower leg: Secondary | ICD-10-CM | POA: Diagnosis not present

## 2021-12-01 NOTE — Progress Notes (Signed)
°  Subjective:  Patient ID: Richard [name], male    DOB: Apr 20, 1961,  MRN: [id number]  Chief Complaint  Patient presents with   Foot Ulcer    Right foot    61 y.o. male presents with the above complaint. History confirmed with patient.  Patient states that he is doing much better.  He has noticed that is mostly callus formation.  He is working on his glucose control.  Once his sugar has come down we will discuss doing a floating osteotomy to get help and rid of the callus.  PCP: [name], DO; last seen 08/17/21  Objective:  Physical Exam: warm, good capillary refill, nail exam onychomycosis of the toenails, no trophic changes or ulcerative lesions. DP pulses palpable, PT pulses palpable, and protective sensation absent Left Foot: partial 4th/th ray resection noted  Right Foot: HPK submet 2 with healed ulceration no warmth, no erythema, no SOI.  Hemoglobin A1C  Date Value Ref Range Status  08/17/2021 9.9 (A) 4.0 - 5.6 % Final   Hgb A1c MFr Bld  Date Value Ref Range Status  12/07/2020 7.2 (H) 4.8 - 5.6 % Final    Comment:    (NOTE) Pre diabetes:          5.7%-6.4%  Diabetes:              >6.4%  Glycemic control for   <7.0% adults with diabetes    No images are attached to the encounter.  Assessment:   1. Metatarsal deformity, right      Plan:  Patient was evaluated and treated and all questions answered.   DM Foot ulcer right 2/2 metatarsal deformity. - Wound essentially healed. Debrided only of callus today. Slight bleed cauterized with silver nitrate and dressed with band-aid. Again discussed should his A1c get below 8 we can consider metatarsal osteotomy, but this would need to be under 8 to consider doing so.

## 2021-12-14 ENCOUNTER — Other Ambulatory Visit: Payer: Self-pay

## 2021-12-14 ENCOUNTER — Ambulatory Visit (INDEPENDENT_AMBULATORY_CARE_PROVIDER_SITE_OTHER): Payer: Medicare Other | Admitting: Internal Medicine

## 2021-12-14 ENCOUNTER — Encounter: Payer: Self-pay | Admitting: Internal Medicine

## 2021-12-14 VITALS — BP 120/68 | HR 90 | Temp 98.2°F | Ht 74.0 in | Wt 224.7 lb

## 2021-12-14 DIAGNOSIS — E1142 Type 2 diabetes mellitus with diabetic polyneuropathy: Secondary | ICD-10-CM

## 2021-12-14 DIAGNOSIS — J449 Chronic obstructive pulmonary disease, unspecified: Secondary | ICD-10-CM

## 2021-12-14 DIAGNOSIS — E1151 Type 2 diabetes mellitus with diabetic peripheral angiopathy without gangrene: Secondary | ICD-10-CM | POA: Diagnosis not present

## 2021-12-14 DIAGNOSIS — I700 Atherosclerosis of aorta: Secondary | ICD-10-CM

## 2021-12-14 DIAGNOSIS — Z89422 Acquired absence of other left toe(s): Secondary | ICD-10-CM

## 2021-12-14 LAB — GLUCOSE, CAPILLARY: Glucose-Capillary: 159 mg/dL — ABNORMAL HIGH (ref 70–99)

## 2021-12-14 LAB — POCT GLYCOSYLATED HEMOGLOBIN (HGB A1C): Hemoglobin A1C: 8.9 % — AB (ref 4.0–5.6)

## 2021-12-14 MED ORDER — ALBUTEROL SULFATE HFA 108 (90 BASE) MCG/ACT IN AERS: 1.0000 | INHALATION_SPRAY | RESPIRATORY_TRACT | 6 refills | Status: DC | PRN

## 2021-12-14 MED ORDER — TRULICITY 3 MG/0.5ML ~~LOC~~ SOAJ: 3.0000 mg | SUBCUTANEOUS | 11 refills | Status: DC

## 2021-12-14 MED ORDER — ALBUTEROL SULFATE (2.5 MG/3ML) 0.083% IN NEBU: 2.5000 mg | INHALATION_SOLUTION | Freq: Four times a day (QID) | RESPIRATORY_TRACT | 11 refills | Status: DC | PRN

## 2021-12-14 MED ORDER — ZOSTER VAC RECOMB ADJUVANTED 50 MCG/0.5ML IM SUSR: 0.5000 mL | Freq: Once | INTRAMUSCULAR | 0 refills | Status: AC

## 2021-12-14 NOTE — Progress Notes (Incomplete)
°  Subjective:  HPI: [name] is a 61 y.o. male who presents for f/u DM  Please see Assessment and Plan below for the status of his chronic medical problems.  Objective:  Physical Exam: There were no vitals filed for this visit. There is no height or weight on file to calculate BMI. Physical Exam Assessment & Plan:  See Encounters Tab for problem based charting.  Medications Ordered No orders of the defined types were placed in this encounter.  Other Orders Orders Placed This Encounter  Procedures   POC Hbg A1C   Follow Up: No follow-ups on file.

## 2021-12-15 LAB — MICROALBUMIN / CREATININE URINE RATIO
Creatinine, Urine: 69.7 mg/dL
Microalb/Creat Ratio: 7 mg/g creat (ref 0–29)
Microalbumin, Urine: 5.2 ug/mL

## 2021-12-20 NOTE — Assessment & Plan Note (Signed)
Chronic and stable continue atorvastatin 40 mg daily ?

## 2021-12-20 NOTE — Assessment & Plan Note (Signed)
>>  ASSESSMENT AND PLAN FOR HISTORY OF COMPLETE RAY AMPUTATION OF FIFTH TOE OF LEFT FOOT (HCC) WRITTEN ON 12/20/2021 11:29 AM BY Idamay Hosein C, DO  Wearing diabetic shoes today and has been following with podiatry.

## 2021-12-20 NOTE — Assessment & Plan Note (Signed)
Wearing diabetic shoes today and has been following with podiatry. ?

## 2021-12-20 NOTE — Assessment & Plan Note (Signed)
Reports sugars have been improved in the last 2 to 3 weeks.  Attributes to eating a little healthier.  Taking all of his medications reports motivation to stay off insulin. ? ?Our A1c is not to goal today at 8.9%.  I will have him increase Trulicity to 3 mg weekly.  He may continue Synjardy 02-999 twice daily and Actos 30 mg daily ?

## 2021-12-20 NOTE — Assessment & Plan Note (Signed)
Overall doing fine notes that he is running low on albuterol and request refills of both his nebulizer solution and inhaler.  Otherwise continues with Dulera and Singulair. ?

## 2022-01-25 ENCOUNTER — Other Ambulatory Visit: Payer: Self-pay | Admitting: Sports Medicine

## 2022-01-25 ENCOUNTER — Ambulatory Visit (INDEPENDENT_AMBULATORY_CARE_PROVIDER_SITE_OTHER): Payer: Medicare Other | Admitting: Sports Medicine

## 2022-01-25 ENCOUNTER — Encounter: Payer: Self-pay | Admitting: Sports Medicine

## 2022-01-25 ENCOUNTER — Ambulatory Visit (INDEPENDENT_AMBULATORY_CARE_PROVIDER_SITE_OTHER): Payer: Medicare Other

## 2022-01-25 DIAGNOSIS — I739 Peripheral vascular disease, unspecified: Secondary | ICD-10-CM

## 2022-01-25 DIAGNOSIS — L97512 Non-pressure chronic ulcer of other part of right foot with fat layer exposed: Secondary | ICD-10-CM | POA: Diagnosis not present

## 2022-01-25 DIAGNOSIS — E1140 Type 2 diabetes mellitus with diabetic neuropathy, unspecified: Secondary | ICD-10-CM | POA: Diagnosis not present

## 2022-01-25 DIAGNOSIS — E11621 Type 2 diabetes mellitus with foot ulcer: Secondary | ICD-10-CM | POA: Diagnosis not present

## 2022-01-25 DIAGNOSIS — L97411 Non-pressure chronic ulcer of right heel and midfoot limited to breakdown of skin: Secondary | ICD-10-CM

## 2022-01-25 DIAGNOSIS — M79671 Pain in right foot: Secondary | ICD-10-CM | POA: Diagnosis not present

## 2022-01-25 MED ORDER — SILVER SULFADIAZINE 1 % EX CREA: TOPICAL_CREAM | CUTANEOUS | 0 refills | Status: DC

## 2022-01-25 MED ORDER — SULFAMETHOXAZOLE-TRIMETHOPRIM 400-80 MG PO TABS: 1.0000 | ORAL_TABLET | Freq: Two times a day (BID) | ORAL | 0 refills | Status: DC

## 2022-01-25 NOTE — Progress Notes (Signed)
Subjective: ?Richard [name] is a 61 y.o. male patient seen in office for evaluation of ulceration of the bottom of the right foot.  Patient is typically seeing Dr. Posey Pronto and reports that he was doing good.  Patient has a history of diabetes and a blood glucose level today of not recorded.   Patient reports a smell 2 days ago and noticed some bleeding on his socks so he called today for an appointment does not recall anything going on prior and states that he was doing good but he does remember him doing a lot of walking and standing and had some occasional pain on last week patient states that he does a lot of walking because he is currently in school right now.  Denies nausea/fever/vomiting/chills/night sweats/shortness of breath.  Patient has no other pedal complaints at this time. ? ?Patient Active Problem List  ? Diagnosis Date Noted  ? Upper respiratory infection 09/14/2021  ? Elevated TSH 08/17/2021  ? Mild aortic stenosis by prior echocardiogram 03/02/2021  ? Mild mitral stenosis by prior echocardiogram 03/02/2021  ? 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 Impingement syndrome of right shoulder 01/29/2020  ? Lumbar strain 11/28/2018  ? Mediastinal mass 08/25/2018  ? Cerumen impaction 08/21/2018  ? Osteoarthritis of left knee 01/09/2018  ? Peripheral arterial disease ([place]) 10/21/2017  ? Erectile dysfunction 03/06/2017  ? Abdominal aortic atherosclerosis ([place]) 01/18/2017  ? Vitamin D deficiency 07/02/2016  ? Tubular adenoma of colon 02/06/2016  ? Diabetic neuropathy, painful ([place]) 11/30/2015  ? Overweight (BMI 25.0-29.9) 11/30/2015  ? CAD in native artery 10/20/2015  ? 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 Essential hypertension   ? Controlled type 2 diabetes mellitus with diabetic peripheral angiopathy without gangrene, without long-term current use of insulin ([place])   ? Asthma-COPD overlap syndrome ([place])   ? ?Current Outpatient Medications on File Prior to Visit   ?Medication Sig Dispense Refill  ? Accu-Chek FastClix Lancets MISC Check blood sugar two times a day 204 each 5  ? Accu-Chek Softclix Lancets lancets Check blood sugar two times a day, 100 each 12  ? albuterol (PROVENTIL) (2.5 MG/3ML) 0.083% nebulizer solution Take 3 mLs (2.5 mg total) by nebulization every 6 (six) hours as needed for wheezing or shortness of breath. 75 mL 11  ? albuterol (VENTOLIN HFA) 108 (90 Base) MCG/ACT inhaler Inhale 1-2 puffs into the lungs every 4 (four) hours as needed for wheezing or shortness of breath. INHALE 2 PUFFS INTO THE LUNGS EVERY 4 HOURS AS NEEDED FOR WHEEZING OR SHORTNESS OF BREATH 18 g 6  ? ascorbic acid (VITAMIN C) 500 MG tablet Take 500 mg by mouth daily.    ? aspirin EC 81 MG EC tablet Take 1 tablet (81 mg total) by mouth daily. 30 tablet 3  ? atorvastatin (LIPITOR) 40 MG tablet Take 1 tablet (40 mg total) by mouth daily. 90 tablet 3  ? Blood Glucose Monitoring Suppl (ACCU-CHEK GUIDE) w/Device KIT USE TO CHECK BLOOD SUGAR TWICE DAILY 1 kit 0  ? diclofenac sodium (VOLTAREN) 1 % GEL Apply 4 g topically 4 (four) times daily as needed. (Patient taking differently: Apply 4 g topically daily.) 100 g 5  ? Dulaglutide (TRULICITY) 3 EH/2.1YY SOPN Inject 3 mg as directed once a week. 2 mL 11  ? Empagliflozin-metFORMIN HCl (SYNJARDY) 02-999 MG TABS Take 1 tablet by mouth 2 (two) times daily. 180 tablet 3  ? glucose blood (ACCU-CHEK GUIDE) test strip  USE TO CHECK BLOOD SUGARS TWICE DAILY 200 strip 5  ? ibuprofen (ADVIL) 200 MG tablet Take 200 mg by mouth every 6 (six) hours as needed for mild pain.    ? ibuprofen (ADVIL,MOTRIN) 600 MG tablet Take 1 tablet (600 mg total) by mouth every 6 (six) hours as needed. 30 tablet 0  ? ketorolac (ACULAR) 0.4 % SOLN Place 1 drop into the left eye 4 (four) times daily.    ? Lancets Misc. (UNISTIK 2 NORMAL) MISC Check blood sugar two times a day    ? latanoprost (XALATAN) 0.005 % ophthalmic solution Place 1 drop into both eyes at bedtime.     ?  levocetirizine (XYZAL) 5 MG tablet Take 1 tablet (5 mg total) by mouth every evening. 90 tablet 3  ? mometasone-formoterol (DULERA) 200-5 MCG/ACT AERO Inhale 2 puffs into the lungs 2 (two) times daily. 13 g 3  ? montelukast (SINGULAIR) 10 MG tablet Take 1 tablet (10 mg total) by mouth at bedtime. 90 tablet 3  ? mupirocin ointment (BACTROBAN) 2 % Apply 1 application topically 2 (two) times daily. 30 g 2  ? nitroGLYCERIN (NITRODUR - DOSED IN MG/24 HR) 0.2 mg/hr patch Place 1 patch (0.2 mg total) onto the skin daily. 30 patch 12  ? ofloxacin (OCUFLOX) 0.3 % ophthalmic solution Place 1 drop into the left eye 4 (four) times daily.    ? pentoxifylline (TRENTAL) 400 MG CR tablet Take 1 tablet (400 mg total) by mouth 3 (three) times daily with meals. 90 tablet 3  ? pioglitazone (ACTOS) 30 MG tablet Take 1 tablet (30 mg total) by mouth daily. 90 tablet 3  ? prednisoLONE acetate (PRED FORTE) 1 % ophthalmic suspension Place 1 drop into the left eye 4 (four) times daily.    ? pregabalin (LYRICA) 100 MG capsule Take 1 capsule (100 mg total) by mouth 3 (three) times daily. 90 capsule 2  ? triamcinolone ointment (KENALOG) 0.1 % Apply 1 application topically 2 (two) times daily as needed. 80 g 5  ? TRULICITY 1.5 XT/0.5WP SOPN INJECT 0.5MLS(1.5MG ) INTO THE SKIN ONCE A WEEK 12 mL 1  ? ?No current facility-administered medications on file prior to visit.  ? ?Allergies  ?Allergen Reactions  ? Shellfish Allergy Shortness Of Breath  ? ? ?Recent Results (from the past 2160 hour(s))  ?Glucose, capillary     Status: Abnormal  ? Collection Time: 12/14/21  9:00 AM  ?Result Value Ref Range  ? Glucose-Capillary 159 (H) 70 - 99 mg/dL  ?  Comment: Glucose reference range applies only to samples taken after fasting for at least 8 hours.  ?POC Hbg A1C     Status: Abnormal  ? Collection Time: 12/14/21  9:08 AM  ?Result Value Ref Range  ? Hemoglobin A1C 8.9 (A) 4.0 - 5.6 %  ? HbA1c POC (<> result, manual entry)    ? HbA1c, POC (prediabetic range)     ? HbA1c, POC (controlled diabetic range)    ?Microalbumin / Creatinine Urine Ratio     Status: None  ? Collection Time: 12/14/21  9:27 AM  ?Result Value Ref Range  ? Creatinine, Urine 69.7 Not Estab. mg/dL  ? Microalbumin, Urine 5.2 Not Estab. ug/mL  ? Microalb/Creat Ratio 7 0 - 29 mg/g creat  ?  Comment:                        Normal:  0 -  29 ?                       Moderately increased: 30 - 300 ?                       Severely increased:       >300 ?  ? ? ?Objective: ?There were no vitals filed for this visit. ? ?General: Patient is awake, alert, oriented x 3 and in no acute distress. ? ?Dermatology: Skin is warm and dry bilateral with a full thickness ulceration present plantar right forefoot.  Ulceration measures 2 cm x 1 cm x 0.5 cm. There is a macerated and keratotic border with a fibrotic base. The ulceration does not  ?probe to bone. There is faint malodor that was expressed once the callus was trimmed back and revealed the underlying wound as above, bloody and serous active drainage, no obvious erythema, forefoot edema.  ?  ?Vascular: Dorsalis Pedis pulse = 1/4 Bilateral,  Posterior Tibial pulse = 0/4 Bilateral,  Capillary Fill Time < 5 seconds ? ?Neurologic: Protective absent bilateral. ? ?Musculosketal: There is minimal pain to palpation to right plantar forefoot at the area of ulceration even though patient has severe neuropathy. ? ?Xrays, Right foot: At the right forefoot no bony destruction suggestive of osteomyelitis. No gas in soft tissues.  ? ?No results for input(s): GRAMSTAIN, LABORGA in the last 8760 hours. ? ?Assessment and Plan:  ?Problem List Items Addressed This Visit   ? ?  ? Cardiovascular and Mediastinum  ? Peripheral arterial disease (HCC) (Chronic)  ?  ? Endocrine  ? Diabetic neuropathy, painful (HCC) (Chronic)  ? ?Other Visit Diagnoses   ? ? 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  Relevant Orders  ? DG Foot Complete Right  ? WOUND CULTURE  ? Right foot pain      ? ?  ? ? ? ?-Examined patient and discussed the progression of the wound and treatment alternatives. ?-Xrays reviewed ?- Exc

## 2022-01-29 ENCOUNTER — Telehealth: Payer: Self-pay | Admitting: Podiatry

## 2022-01-29 NOTE — Telephone Encounter (Signed)
Received voicemail from [place] @ [facility] about a culture they received but it did not have account number and they wanted to confirm he is our pt and need our account number. ?Please call [phone] reference # [id number] W ?

## 2022-01-30 ENCOUNTER — Telehealth: Payer: Self-pay | Admitting: Sports Medicine

## 2022-01-30 LAB — HOUSE ACCOUNT TRACKING

## 2022-01-30 LAB — WOUND CULTURE
MICRO NUMBER:: 13291027
SPECIMEN QUALITY:: ADEQUATE

## 2022-01-30 NOTE — Telephone Encounter (Signed)
Called [facility] and spoke, they are faxing wound culture results asap

## 2022-01-30 NOTE — Telephone Encounter (Signed)
Patient has received the instructions per physician and I made sure that he is following all the information given at visit and it anything worsens to give [place] a call back.he verbalized understanding. ?He said that the wound does seem a little soft when pressed, just wanted to make sure that it was ok.  ?

## 2022-01-30 NOTE — Telephone Encounter (Signed)
Patient has been called , no answer, left vmessage with recommendations per Dr Cannon Kettle.

## 2022-01-30 NOTE — Telephone Encounter (Signed)
Results are now in your folder

## 2022-01-30 NOTE — Telephone Encounter (Signed)
Pt called wanted to know if he was able to take a shower while his foot is bandaged. Sent secure chat to Dr. Cannon Kettle. Dr. Cannon Kettle replied that he could take a shower just put a bag over wound to ensure it will not get wet ?

## 2022-01-31 NOTE — Telephone Encounter (Signed)
Yes, will be scanned into epic

## 2022-02-02 ENCOUNTER — Encounter: Payer: Self-pay | Admitting: Podiatry

## 2022-02-02 ENCOUNTER — Telehealth: Payer: Self-pay | Admitting: Podiatry

## 2022-02-02 ENCOUNTER — Ambulatory Visit (INDEPENDENT_AMBULATORY_CARE_PROVIDER_SITE_OTHER): Payer: Medicare Other | Admitting: Podiatry

## 2022-02-02 DIAGNOSIS — E11621 Type 2 diabetes mellitus with foot ulcer: Secondary | ICD-10-CM | POA: Diagnosis not present

## 2022-02-02 DIAGNOSIS — L97512 Non-pressure chronic ulcer of other part of right foot with fat layer exposed: Secondary | ICD-10-CM

## 2022-02-02 NOTE — Telephone Encounter (Signed)
Pt wants to know if he should sleep in the boot he was put in today ? ?Please advise ?

## 2022-02-02 NOTE — Progress Notes (Signed)
?Subjective:  ?Patient ID: Richard [name], male    DOB: 1961/08/22,  MRN: [id number] ? ?Chief Complaint  ?Patient presents with  ? Wound Check  ?  Wound care follow up   ? ? ?61 y.o. male presents for wound care.  Patient presents with right submetatarsal 2/3 hyperkeratotic lesion/ulceration.  Patient states that he is feeling a lot better.  He has been wearing the boot.  He denies any other acute complaints.  He was seen by Dr. Cannon Kettle who recommended Silvadene and performed a wound culture with the results are not back yet. ? ? ?Review of Systems: Negative except as noted in the HPI. Denies N/V/F/Ch. ? ?Past Medical History:  ?Diagnosis Date  ? Asthma   ? Chest pain 06/28/2016  ? Chronic bronchitis ([place])   ? Clostridium difficile colitis 09/09/2014  ? Constipation 09/26/2018  ? COPD (chronic obstructive pulmonary disease) ([place])   ? Eczema   ? Essential hypertension   ? Glaucoma, bilateral   ? surgery on left eye but not right  ? 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 Hyperlipidemia   ? MRSA infection 09/05/2020  ? Need for Tdap vaccination 09/19/2018  ? Neuromuscular disorder ([place])   ? neuropathy in feet  ? No natural teeth   ? Osteomyelitis due to type 2 diabetes mellitus ([place]) 12/07/2020  ? Substance abuse ([place])   ? crack - recovered x 30 yrs  ? Type II diabetes mellitus ([place])   ? diagnosed in 2013  ? ? ?Current Outpatient Medications:  ?  Accu-Chek FastClix Lancets MISC, Check blood sugar two times a day, Disp: 204 each, Rfl: 5 ?  Accu-Chek Softclix Lancets lancets, Check blood sugar two times a day,, Disp: 100 each, Rfl: 12 ?  albuterol (PROVENTIL) (2.5 MG/3ML) 0.083% nebulizer solution, Take 3 mLs (2.5 mg total) by nebulization every 6 (six) hours as needed for wheezing or shortness of breath., Disp: 75 mL, Rfl: 11 ?  albuterol (VENTOLIN HFA) 108 (90 Base) MCG/ACT inhaler, Inhale 1-2 puffs into the lungs every 4 (four) hours as needed for wheezing or shortness of breath.  INHALE 2 PUFFS INTO THE LUNGS EVERY 4 HOURS AS NEEDED FOR WHEEZING OR SHORTNESS OF BREATH, Disp: 18 g, Rfl: 6 ?  ascorbic acid (VITAMIN C) 500 MG tablet, Take 500 mg by mouth daily., Disp: , Rfl:  ?  aspirin EC 81 MG EC tablet, Take 1 tablet (81 mg total) by mouth daily., Disp: 30 tablet, Rfl: 3 ?  atorvastatin (LIPITOR) 40 MG tablet, Take 1 tablet (40 mg total) by mouth daily., Disp: 90 tablet, Rfl: 3 ?  Blood Glucose Monitoring Suppl (ACCU-CHEK GUIDE) w/Device KIT, USE TO CHECK BLOOD SUGAR TWICE DAILY, Disp: 1 kit, Rfl: 0 ?  diclofenac sodium (VOLTAREN) 1 % GEL, Apply 4 g topically 4 (four) times daily as needed. (Patient taking differently: Apply 4 g topically daily.), Disp: 100 g, Rfl: 5 ?  Dulaglutide (TRULICITY) 3 ZS/0.1UX SOPN, Inject 3 mg as directed once a week., Disp: 2 mL, Rfl: 11 ?  Empagliflozin-metFORMIN HCl (SYNJARDY) 02-999 MG TABS, Take 1 tablet by mouth 2 (two) times daily., Disp: 180 tablet, Rfl: 3 ?  glucose blood (ACCU-CHEK GUIDE) test strip, USE TO CHECK BLOOD SUGARS TWICE DAILY, Disp: 200 strip, Rfl: 5 ?  ibuprofen (ADVIL) 200 MG tablet, Take 200 mg by mouth every 6 (six) hours as needed for mild pain., Disp: , Rfl:  ?  ibuprofen (ADVIL,MOTRIN) 600 MG tablet,  Take 1 tablet (600 mg total) by mouth every 6 (six) hours as needed., Disp: 30 tablet, Rfl: 0 ?  ketorolac (ACULAR) 0.4 % SOLN, Place 1 drop into the left eye 4 (four) times daily., Disp: , Rfl:  ?  Lancets Misc. (UNISTIK 2 NORMAL) MISC, Check blood sugar two times a day, Disp: , Rfl:  ?  latanoprost (XALATAN) 0.005 % ophthalmic solution, Place 1 drop into both eyes at bedtime. , Disp: , Rfl:  ?  levocetirizine (XYZAL) 5 MG tablet, Take 1 tablet (5 mg total) by mouth every evening., Disp: 90 tablet, Rfl: 3 ?  mometasone-formoterol (DULERA) 200-5 MCG/ACT AERO, Inhale 2 puffs into the lungs 2 (two) times daily., Disp: 13 g, Rfl: 3 ?  montelukast (SINGULAIR) 10 MG tablet, Take 1 tablet (10 mg total) by mouth at bedtime., Disp: 90 tablet,  Rfl: 3 ?  mupirocin ointment (BACTROBAN) 2 %, Apply 1 application topically 2 (two) times daily., Disp: 30 g, Rfl: 2 ?  nitroGLYCERIN (NITRODUR - DOSED IN MG/24 HR) 0.2 mg/hr patch, Place 1 patch (0.2 mg total) onto the skin daily., Disp: 30 patch, Rfl: 12 ?  ofloxacin (OCUFLOX) 0.3 % ophthalmic solution, Place 1 drop into the left eye 4 (four) times daily., Disp: , Rfl:  ?  pentoxifylline (TRENTAL) 400 MG CR tablet, Take 1 tablet (400 mg total) by mouth 3 (three) times daily with meals., Disp: 90 tablet, Rfl: 3 ?  pioglitazone (ACTOS) 30 MG tablet, Take 1 tablet (30 mg total) by mouth daily., Disp: 90 tablet, Rfl: 3 ?  prednisoLONE acetate (PRED FORTE) 1 % ophthalmic suspension, Place 1 drop into the left eye 4 (four) times daily., Disp: , Rfl:  ?  pregabalin (LYRICA) 100 MG capsule, Take 1 capsule (100 mg total) by mouth 3 (three) times daily., Disp: 90 capsule, Rfl: 2 ?  silver sulfADIAZINE (SILVADENE) 1 % cream, Apply pea-sized amount to wound daily., Disp: 50 g, Rfl: 0 ?  sulfamethoxazole-trimethoprim (BACTRIM) 400-80 MG tablet, Take 1 tablet by mouth 2 (two) times daily., Disp: 28 tablet, Rfl: 0 ?  triamcinolone ointment (KENALOG) 0.1 %, Apply 1 application topically 2 (two) times daily as needed., Disp: 80 g, Rfl: 5 ?  TRULICITY 1.5 PF/2.9WK SOPN, INJECT 0.5MLS(1.5MG ) INTO THE SKIN ONCE A WEEK, Disp: 12 mL, Rfl: 1 ? ?Social History  ? ?Tobacco Use  ?Smoking Status Former  ? Packs/day: 0.50  ? Years: 45.00  ? Pack years: 22.50  ? Types: Cigarettes  ? Quit date: 01/12/2018  ? Years since quitting: 4.0  ?Smokeless Tobacco Never  ? ? ?Allergies  ?Allergen Reactions  ? Shellfish Allergy Shortness Of Breath  ? ?Objective:  ?There were no vitals filed for this visit. ?There is no height or weight on file to calculate BMI. ?Constitutional Well developed. ?Well nourished.  ?Vascular Dorsalis pedis pulses palpable bilaterally. ?Posterior tibial pulses palpable bilaterally. ?Capillary refill normal to all digits.  ?No  cyanosis or clubbing noted. ?Pedal hair growth normal.  ?Neurologic Normal speech. ?Oriented to person, place, and time. ?Protective sensation absent  ?Dermatologic Wound Location: Right submetatarsal 2/3 with fat layer exposed.  Does not probe down to deep tissue.  Granular wound bed noted.  No malodor present.  No purulent drainage noted. ?Wound Base: Mixed Granular/Fibrotic ?Peri-wound: Calloused ?Exudate: Scant/small amount Serosanguinous exudate ?Wound Measurements: ?-See below  ?Orthopedic: No pain to palpation either foot.  ? ?Radiographs: None ?Assessment:  ? ?1. Diabetic ulcer of other part of right foot associated with type 2 diabetes  mellitus, with fat layer exposed ([place])   ? ?Plan:  ?Patient was evaluated and treated and all questions answered. ? ?Ulcer right submetatarsal 2 3 ulceration noted with fat layer exposed ?-Debridement as below. ?-Dressed with Silvadene DSD. ?-Continue off-loading with surgical shoe. ?-Patient is a diabetic with last A1c of 8.9.  Once it returns below 8 we can discuss doing metatarsal osteotomy ? ?Procedure: Excisional Debridement of Wound ?Tool: Sharp chisel blade/tissue nipper ?Rationale: Removal of non-viable soft tissue from the wound to promote healing.  ?Anesthesia: none ?Pre-Debridement Wound Measurements: 1.5 cm cm x 0.5 cm cm x 0.3 cm cm  ?Post-Debridement Wound Measurements: 0.7 cm cm x 0.7 cm cm x 0.3 cm cm  ?Type of Debridement: Sharp Excisional ?Tissue Removed: Non-viable soft tissue ?Blood loss: Minimal (<50cc) ?Depth of Debridement: subcutaneous tissue. ?Technique: Sharp excisional debridement to bleeding, viable wound base.  ?Wound Progress: This is my initial evaluation I will continue to monitor the progression of it ?Site healing conversation 7 ?Dressing: Dry, sterile, compression dressing. ?Disposition: Patient tolerated procedure well. Patient to return in 1 week for follow-up. ? ?No follow-ups on file. ? ?  ?

## 2022-03-02 ENCOUNTER — Ambulatory Visit (INDEPENDENT_AMBULATORY_CARE_PROVIDER_SITE_OTHER): Payer: Medicare Other | Admitting: Podiatry

## 2022-03-02 DIAGNOSIS — L97512 Non-pressure chronic ulcer of other part of right foot with fat layer exposed: Secondary | ICD-10-CM | POA: Diagnosis not present

## 2022-03-02 DIAGNOSIS — E11621 Type 2 diabetes mellitus with foot ulcer: Secondary | ICD-10-CM

## 2022-03-02 DIAGNOSIS — E1140 Type 2 diabetes mellitus with diabetic neuropathy, unspecified: Secondary | ICD-10-CM | POA: Diagnosis not present

## 2022-03-07 NOTE — Progress Notes (Signed)
Subjective:  Patient ID: Richard [name], male    DOB: 1961-03-07,  MRN: [id number]  Chief Complaint  Patient presents with   Diabetic Ulcer    61 y.o. male presents for wound care.  Patient presents with follow-up of right submetatarsal 2 3 ulceration.  He states is doing a lot better he does not have any pain.  He thinks he may have healed.   Review of Systems: Negative except as noted in the HPI. Denies N/V/F/Ch.  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[place]) 08/03/2020   Hyperlipidemia    MRSA infection 09/05/2020   Need for Tdap vaccination 09/19/2018   Neuromuscular disorder ([place])    neuropathy in feet   No natural teeth    Osteomyelitis due to type 2 diabetes mellitus ([place]) 12/07/2020   Substance abuse ([place])    crack - recovered x 30 yrs   Type II diabetes mellitus ([place])    diagnosed in 2013    Current Outpatient Medications:    Accu-Chek FastClix Lancets MISC, Check blood sugar two times a day, Disp: 204 each, Rfl: 5   Accu-Chek Softclix Lancets lancets, Check blood sugar two times a day,, Disp: 100 each, Rfl: 12   albuterol (PROVENTIL) (2.5 MG/3ML) 0.083% nebulizer solution, Take 3 mLs (2.5 mg total) by nebulization every 6 (six) hours as needed for wheezing or shortness of breath., Disp: 75 mL, Rfl: 11   albuterol (VENTOLIN HFA) 108 (90 Base) MCG/ACT inhaler, Inhale 1-2 puffs into the lungs every 4 (four) hours as needed for wheezing or shortness of breath. INHALE 2 PUFFS INTO THE LUNGS EVERY 4 HOURS AS NEEDED FOR WHEEZING OR SHORTNESS OF BREATH, Disp: 18 g, Rfl: 6   ascorbic acid (VITAMIN C) 500 MG tablet, Take 500 mg by mouth  daily., Disp: , Rfl:    aspirin EC 81 MG EC tablet, Take 1 tablet (81 mg total) by mouth daily., Disp: 30 tablet, Rfl: 3   atorvastatin (LIPITOR) 40 MG tablet, Take 1 tablet (40 mg total) by mouth daily., Disp: 90 tablet, Rfl: 3   Blood Glucose Monitoring Suppl (ACCU-CHEK GUIDE) w/Device KIT, USE TO CHECK BLOOD SUGAR TWICE DAILY, Disp: 1 kit, Rfl: 0   diclofenac sodium (VOLTAREN) 1 % GEL, Apply 4 g topically 4 (four) times daily as needed. (Patient taking differently: Apply 4 g topically daily.), Disp: 100 g, Rfl: 5   Dulaglutide (TRULICITY) 3 IR/6.7EL SOPN, Inject 3 mg as directed once a week., Disp: 2 mL, Rfl: 11   Empagliflozin-metFORMIN HCl (SYNJARDY) 02-999 MG TABS, Take 1 tablet by mouth 2 (two) times daily., Disp: 180 tablet, Rfl: 3   glucose blood (ACCU-CHEK GUIDE) test strip, USE TO CHECK BLOOD SUGARS TWICE DAILY, Disp: 200 strip, Rfl: 5   ibuprofen (ADVIL) 200 MG tablet, Take 200 mg by mouth every 6 (six) hours as needed for mild pain., Disp: , Rfl:    ibuprofen (ADVIL,MOTRIN) 600 MG tablet, Take 1 tablet (600 mg total) by mouth every 6 (six) hours as needed., Disp: 30 tablet, Rfl: 0   ketorolac (ACULAR) 0.4 % SOLN, Place 1 drop into the  left eye 4 (four) times daily., Disp: , Rfl:    Lancets Misc. (UNISTIK 2 NORMAL) MISC, Check blood sugar two times a day, Disp: , Rfl:    latanoprost (XALATAN) 0.005 % ophthalmic solution, Place 1 drop into both eyes at bedtime. , Disp: , Rfl:    levocetirizine (XYZAL) 5 MG tablet, Take 1 tablet (5 mg total) by mouth every evening., Disp: 90 tablet, Rfl: 3   mometasone-formoterol (DULERA) 200-5 MCG/ACT AERO, Inhale 2 puffs into the lungs 2 (two) times daily., Disp: 13 g, Rfl: 3   montelukast (SINGULAIR) 10 MG tablet, Take 1 tablet (10 mg total) by mouth at bedtime., Disp: 90 tablet, Rfl: 3   mupirocin ointment (BACTROBAN) 2 %, Apply 1 application topically 2 (two) times daily., Disp: 30 g, Rfl: 2   nitroGLYCERIN (NITRODUR - DOSED IN MG/24 HR) 0.2 mg/hr  patch, Place 1 patch (0.2 mg total) onto the skin daily., Disp: 30 patch, Rfl: 12   ofloxacin (OCUFLOX) 0.3 % ophthalmic solution, Place 1 drop into the left eye 4 (four) times daily., Disp: , Rfl:    pentoxifylline (TRENTAL) 400 MG CR tablet, Take 1 tablet (400 mg total) by mouth 3 (three) times daily with meals., Disp: 90 tablet, Rfl: 3   pioglitazone (ACTOS) 30 MG tablet, Take 1 tablet (30 mg total) by mouth daily., Disp: 90 tablet, Rfl: 3   prednisoLONE acetate (PRED FORTE) 1 % ophthalmic suspension, Place 1 drop into the left eye 4 (four) times daily., Disp: , Rfl:    pregabalin (LYRICA) 100 MG capsule, Take 1 capsule (100 mg total) by mouth 3 (three) times daily., Disp: 90 capsule, Rfl: 2   silver sulfADIAZINE (SILVADENE) 1 % cream, Apply pea-sized amount to wound daily., Disp: 50 g, Rfl: 0   sulfamethoxazole-trimethoprim (BACTRIM) 400-80 MG tablet, Take 1 tablet by mouth 2 (two) times daily., Disp: 28 tablet, Rfl: 0   triamcinolone ointment (KENALOG) 0.1 %, Apply 1 application topically 2 (two) times daily as needed., Disp: 80 g, Rfl: 5   TRULICITY 1.5 AV/4.0JW SOPN, INJECT 0.5MLS(1.5MG) INTO THE SKIN ONCE A WEEK, Disp: 12 mL, Rfl: 1  Social History   Tobacco Use  Smoking Status Former   Packs/day: 0.50   Years: 45.00   Pack years: 22.50   Types: Cigarettes   Quit date: 01/12/2018   Years since quitting: 4.1  Smokeless Tobacco Never    Allergies  Allergen Reactions   Shellfish Allergy Shortness Of Breath   Objective:  There were no vitals filed for this visit. There is no height or weight on file to calculate BMI. Constitutional Well developed. Well nourished.  Vascular Dorsalis pedis pulses palpable bilaterally. Posterior tibial pulses palpable bilaterally. Capillary refill normal to all digits.  No cyanosis or clubbing noted. Pedal hair growth normal.  Neurologic Normal speech. Oriented to person, place, and time. Protective sensation absent  Dermatologic Right  submetatarsal 2 3 ulceration that is now completely reepithelialized.  No signs of recurrence noted no signs of infection noted.  No other bony abnormalities identified  Orthopedic: No pain to palpation either foot.   Radiographs: None Assessment:   1. 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 Diabetic neuropathy, painful ([place])     Plan:  Patient was evaluated and treated and all questions answered.  Ulcer right submetatarsal 2 3 ulceration noted with fat layer exposed -Clinically healed and the skin has completely reepithelialized.  I  discussed with him that he will benefit from a surgical floating osteotomy once his A1c has returned back to under 8% A1c.  He states understanding and once he has been able to control his sugars he will come back and see me and we will discuss floating osteotomy at that time.  If any foot and ankle issues are future of asked him to come back and see me.  He states understanding  No follow-ups on file.

## 2022-03-08 ENCOUNTER — Other Ambulatory Visit: Payer: Self-pay

## 2022-03-08 ENCOUNTER — Ambulatory Visit (INDEPENDENT_AMBULATORY_CARE_PROVIDER_SITE_OTHER): Payer: Medicare Other | Admitting: Internal Medicine

## 2022-03-08 ENCOUNTER — Encounter: Payer: Self-pay | Admitting: Internal Medicine

## 2022-03-08 VITALS — BP 122/75 | HR 92 | Temp 98.3°F | Ht 74.0 in | Wt 220.5 lb

## 2022-03-08 DIAGNOSIS — J4550 Severe persistent asthma, uncomplicated: Secondary | ICD-10-CM

## 2022-03-08 DIAGNOSIS — E1165 Type 2 diabetes mellitus with hyperglycemia: Secondary | ICD-10-CM

## 2022-03-08 DIAGNOSIS — E1151 Type 2 diabetes mellitus with diabetic peripheral angiopathy without gangrene: Secondary | ICD-10-CM

## 2022-03-08 DIAGNOSIS — E1140 Type 2 diabetes mellitus with diabetic neuropathy, unspecified: Secondary | ICD-10-CM | POA: Diagnosis not present

## 2022-03-08 DIAGNOSIS — J4489 Other specified chronic obstructive pulmonary disease: Secondary | ICD-10-CM

## 2022-03-08 DIAGNOSIS — M65351 Trigger finger, right little finger: Secondary | ICD-10-CM

## 2022-03-08 DIAGNOSIS — Z7984 Long term (current) use of oral hypoglycemic drugs: Secondary | ICD-10-CM

## 2022-03-08 DIAGNOSIS — J449 Chronic obstructive pulmonary disease, unspecified: Secondary | ICD-10-CM

## 2022-03-08 LAB — GLUCOSE, CAPILLARY: Glucose-Capillary: 256 mg/dL — ABNORMAL HIGH (ref 70–99)

## 2022-03-08 LAB — POCT GLYCOSYLATED HEMOGLOBIN (HGB A1C): Hemoglobin A1C: 8.7 % — AB (ref 4.0–5.6)

## 2022-03-08 MED ORDER — LEVOCETIRIZINE DIHYDROCHLORIDE 5 MG PO TABS: 5.0000 mg | ORAL_TABLET | Freq: Every evening | ORAL | 3 refills | Status: DC

## 2022-03-08 MED ORDER — SYNJARDY 12.5-1000 MG PO TABS: 1.0000 | ORAL_TABLET | Freq: Two times a day (BID) | ORAL | 3 refills | Status: DC

## 2022-03-08 MED ORDER — DULERA 200-5 MCG/ACT IN AERO: 2.0000 | INHALATION_SPRAY | Freq: Two times a day (BID) | RESPIRATORY_TRACT | 3 refills | Status: DC

## 2022-03-08 MED ORDER — MONTELUKAST SODIUM 10 MG PO TABS: 10.0000 mg | ORAL_TABLET | Freq: Every day | ORAL | 3 refills | Status: DC

## 2022-03-08 MED ORDER — PREGABALIN 100 MG PO CAPS: 100.0000 mg | ORAL_CAPSULE | Freq: Three times a day (TID) | ORAL | 2 refills | Status: DC

## 2022-03-08 NOTE — Assessment & Plan Note (Signed)
Provided refills today for [name] Singulair and Xyzal in addition to his as needed albuterol.  Asthma and COPD currently well controlled but he is concerned that they typically get worse and the summer months.

## 2022-03-08 NOTE — Progress Notes (Signed)
Established Patient Office Visit  Subjective   Patient ID: Richard [name], male    DOB: [birth year]  Age: 61 y.o. MRN: [id number]  Chief Complaint  Patient presents with   Follow-up    Diabetes. Finger right hand.    Richard [name] presents for follow-up of his diabetes he notes that he has been seeing podiatry for a recurrent skin ulcer he has been offered a procedure to help take the pressure off the area but will need the A1c down below 8%.  Last month he increased Trulicity to the 3 mg dose. In addition he has been having some pain in the right fifth digit he notes that the digit will get stuck when he closes his hand tightly.      Objective:     BP 122/75 (BP Location: Right Arm, Patient Position: Sitting, Cuff Size: Normal)   Pulse 92   Temp 98.3 F (36.8 C) (Oral)   Ht '6\' 2"'$  (1.88 m)   Wt 220 lb 8 oz (100 kg)   SpO2 100% Comment: RA  BMI 28.31 kg/m     Physical Exam Vitals and nursing note reviewed.  Constitutional:      Appearance: Normal appearance.  Cardiovascular:     Rate and Rhythm: Normal rate and regular rhythm.  Pulmonary:     Effort: Pulmonary effort is normal.     Breath sounds: Normal breath sounds. No wheezing.  Musculoskeletal:     Comments: Palpable nodule right palm, active locking of right 5th digit with flexion  Neurological:     Mental Status: He is alert.     Results for orders placed or performed in visit on 03/08/22  Glucose, capillary  Result Value Ref Range   Glucose-Capillary 256 (H) 70 - 99 mg/dL  POC Hbg A1C  Result Value Ref Range   Hemoglobin A1C 8.7 (A) 4.0 - 5.6 %   HbA1c POC (<> result, manual entry)     HbA1c, POC (prediabetic range)     HbA1c, POC (controlled diabetic range)      Last hemoglobin A1c Lab Results  Component Value Date   HGBA1C 8.7 (A) 03/08/2022      The ASCVD Risk score (Arnett DK, et al., 2019) failed to calculate for the following reasons:   The valid total cholesterol range is 130 to 320  mg/dL    Assessment & Plan:   Problem List Items Addressed This Visit       Cardiovascular and Mediastinum   Controlled type 2 diabetes mellitus with diabetic peripheral angiopathy without gangrene, without long-term current use of insulin (HCC) (Chronic)    We likely have not seen the full effect of his recent increase in Trulicity but given J2I is 8.7% we will also increase the empagliflozin component of Synjardy.       Relevant Medications   Empagliflozin-metFORMIN HCl (SYNJARDY) 12.02-999 MG TABS     Respiratory   Asthma-COPD overlap syndrome (HCC) (Chronic)    Provided refills today for [facility] Singulair and Xyzal in addition to his as needed albuterol.  Asthma and COPD currently well controlled but he is concerned that they typically get worse and the summer months.       Relevant Medications   montelukast (SINGULAIR) 10 MG tablet   mometasone-formoterol (DULERA) 200-5 MCG/ACT AERO   levocetirizine (XYZAL) 5 MG tablet     Endocrine   Diabetic neuropathy, painful (HCC) (Chronic)   Relevant Medications   Empagliflozin-metFORMIN HCl (SYNJARDY) 12.02-999 MG TABS   pregabalin (LYRICA) 100  MG capsule     Musculoskeletal and Integument   Trigger finger, right little finger    On exam clearly has a trigger finger of the right fifth finger.  Offered him low-dose steroid injection today with instant improvement in symptoms.       Other Visit Diagnoses     Uncontrolled type 2 diabetes mellitus with hyperglycemia, without long-term current use of insulin (HCC)    -  Primary   Relevant Medications   Empagliflozin-metFORMIN HCl (SYNJARDY) 12.02-999 MG TABS   Other Relevant Orders   POC Hbg A1C (Completed)   Severe persistent asthma, uncomplicated       Relevant Medications   montelukast (SINGULAIR) 10 MG tablet   mometasone-formoterol (DULERA) 200-5 MCG/ACT AERO       Return in about 3 months (around 06/08/2022).    [name], DO  Digital Flexor Tendon Sheath  Injection   Pre-operative diagnosis: Digital Flexor Tenosynovitis (Trigger Finger)  After risks and benefits were explained including bleeding, infection, tendon damage or rupture, allergic reaction to medications, vascular injection, and nerve damage, signed consent was obtained.  All questions were answered.    The flexor surface of the right 5th finger was cleaned with alcohol swabs. Using a 27 gauge 1 inch needle the flexor tendon was entered, confirmed by movement of the finger. The needle was withdrawn until resistance on the plunger decreased. The tendon sheath space was then injected with 0.3 mL of Triamcinolone-40 and 0.7 mL of 1% Lidocaine without Epi. The needle was withdrawn, the site was cleaned and covered with a band aid.   The patient did tolerate the procedure well and there were not complications.

## 2022-03-08 NOTE — Assessment & Plan Note (Signed)
On exam clearly has a trigger finger of the right fifth finger.  Offered him low-dose steroid injection today with instant improvement in symptoms.

## 2022-03-08 NOTE — Assessment & Plan Note (Signed)
We likely have not seen the full effect of his recent increase in Trulicity but given F3O is 8.7% we will also increase the empagliflozin component of Synjardy.

## 2022-04-25 ENCOUNTER — Encounter: Payer: Self-pay | Admitting: Podiatry

## 2022-04-25 ENCOUNTER — Ambulatory Visit (INDEPENDENT_AMBULATORY_CARE_PROVIDER_SITE_OTHER): Payer: Medicare Other | Admitting: Podiatry

## 2022-04-25 DIAGNOSIS — B351 Tinea unguium: Secondary | ICD-10-CM

## 2022-04-25 DIAGNOSIS — M79674 Pain in right toe(s): Secondary | ICD-10-CM

## 2022-04-25 DIAGNOSIS — E1169 Type 2 diabetes mellitus with other specified complication: Secondary | ICD-10-CM | POA: Diagnosis not present

## 2022-04-25 DIAGNOSIS — L84 Corns and callosities: Secondary | ICD-10-CM

## 2022-04-25 DIAGNOSIS — I739 Peripheral vascular disease, unspecified: Secondary | ICD-10-CM

## 2022-04-25 DIAGNOSIS — M79675 Pain in left toe(s): Secondary | ICD-10-CM | POA: Diagnosis not present

## 2022-04-25 DIAGNOSIS — E11621 Type 2 diabetes mellitus with foot ulcer: Secondary | ICD-10-CM

## 2022-04-25 DIAGNOSIS — E1140 Type 2 diabetes mellitus with diabetic neuropathy, unspecified: Secondary | ICD-10-CM

## 2022-04-25 NOTE — Progress Notes (Signed)
This patient returns to my office for at risk foot care.  This patient requires this care by a professional since this patient will be at risk due to having diabetic neuropathy and amputation fifth ray left foot and PAD.  This patient is unable to cut nails himself since the patient cannot reach his nails.These nails are painful walking and wearing shoes.  He also has painful pre-ulcerous callus right forefoot.  This patient presents for at risk foot care today.  General Appearance  Alert, conversant and in no acute stress.  Vascular  Dorsalis pedis and posterior tibial  pulses are palpable  bilaterally.  Capillary return is within normal limits  bilaterally. Temperature is within normal limits  bilaterally.  Neurologic  Senn-Weinstein monofilament wire test diminished/absent  bilaterally. Muscle power within normal limits bilaterally.  Nails Thick disfigured discolored nails with subungual debris  from hallux to fifth toes right foot and 1-3 left foot.. No evidence of bacterial infection or drainage bilaterally.  Orthopedic  No limitations of motion  feet .  No crepitus or effusions noted.  No bony pathology or digital deformities noted.  Skin  normotropic skin with no porokeratosis noted bilaterally.  No signs of infections or ulcers noted.   Thick pre-ulcerous callus right forefoot.  Onychomycosis  Pain in right toes  Pain in left toes  Callus right forefoot.  Consent was obtained for treatment procedures.   Mechanical debridement of nails 1-5  bilaterally performed with a nail nipper.  Filed with dremel without incident.    Return office visit    10 weeks                  Told patient to return for periodic foot care and evaluation due to potential at risk complications.   Gardiner Barefoot DPM

## 2022-06-05 ENCOUNTER — Emergency Department (HOSPITAL_COMMUNITY)
Admission: EM | Admit: 2022-06-05 | Discharge: 2022-06-06 | Disposition: A | Discharge status: Home / Self Care | Payer: Medicare Other | Attending: Emergency Medicine | Admitting: Emergency Medicine

## 2022-06-05 ENCOUNTER — Encounter (HOSPITAL_COMMUNITY): Payer: Self-pay

## 2022-06-05 ENCOUNTER — Emergency Department (HOSPITAL_COMMUNITY): Payer: Medicare Other

## 2022-06-05 ENCOUNTER — Other Ambulatory Visit: Payer: Self-pay

## 2022-06-05 DIAGNOSIS — R0602 Shortness of breath: Secondary | ICD-10-CM | POA: Diagnosis not present

## 2022-06-05 DIAGNOSIS — R059 Cough, unspecified: Secondary | ICD-10-CM | POA: Diagnosis not present

## 2022-06-05 DIAGNOSIS — Z20822 Contact with and (suspected) exposure to covid-19: Secondary | ICD-10-CM | POA: Diagnosis not present

## 2022-06-05 DIAGNOSIS — J449 Chronic obstructive pulmonary disease, unspecified: Secondary | ICD-10-CM | POA: Diagnosis not present

## 2022-06-05 DIAGNOSIS — R0989 Other specified symptoms and signs involving the circulatory and respiratory systems: Secondary | ICD-10-CM | POA: Diagnosis not present

## 2022-06-05 DIAGNOSIS — Z7952 Long term (current) use of systemic steroids: Secondary | ICD-10-CM | POA: Insufficient documentation

## 2022-06-05 DIAGNOSIS — J45909 Unspecified asthma, uncomplicated: Secondary | ICD-10-CM | POA: Insufficient documentation

## 2022-06-05 DIAGNOSIS — Z7982 Long term (current) use of aspirin: Secondary | ICD-10-CM | POA: Diagnosis not present

## 2022-06-05 DIAGNOSIS — Z7984 Long term (current) use of oral hypoglycemic drugs: Secondary | ICD-10-CM | POA: Insufficient documentation

## 2022-06-05 DIAGNOSIS — R509 Fever, unspecified: Secondary | ICD-10-CM | POA: Diagnosis not present

## 2022-06-05 DIAGNOSIS — G44209 Tension-type headache, unspecified, not intractable: Secondary | ICD-10-CM

## 2022-06-05 DIAGNOSIS — Z7951 Long term (current) use of inhaled steroids: Secondary | ICD-10-CM | POA: Insufficient documentation

## 2022-06-05 DIAGNOSIS — E119 Type 2 diabetes mellitus without complications: Secondary | ICD-10-CM | POA: Insufficient documentation

## 2022-06-05 DIAGNOSIS — R519 Headache, unspecified: Secondary | ICD-10-CM | POA: Insufficient documentation

## 2022-06-05 LAB — SARS CORONAVIRUS 2 BY RT PCR: SARS Coronavirus 2 by RT PCR: NEGATIVE

## 2022-06-05 LAB — CBG MONITORING, ED: Glucose-Capillary: 96 mg/dL (ref 70–99)

## 2022-06-05 MED ORDER — ALBUTEROL SULFATE (2.5 MG/3ML) 0.083% IN NEBU: 2.5000 mg | INHALATION_SOLUTION | Freq: Four times a day (QID) | RESPIRATORY_TRACT | 3 refills | Status: DC | PRN

## 2022-06-05 MED ORDER — ALBUTEROL (5 MG/ML) CONTINUOUS INHALATION SOLN
20.0000 mg/h | INHALATION_SOLUTION | Freq: Once | RESPIRATORY_TRACT | Status: DC
  Filled 2022-06-05: qty 0.5

## 2022-06-05 MED ORDER — METOCLOPRAMIDE HCL 5 MG/ML IJ SOLN
10.0000 mg | Freq: Once | INTRAMUSCULAR | Status: AC
  Administered 2022-06-05: 10 mg via INTRAVENOUS
  Filled 2022-06-05: qty 2

## 2022-06-05 MED ORDER — IPRATROPIUM-ALBUTEROL 0.5-2.5 (3) MG/3ML IN SOLN: 3.0000 mL | Freq: Once | RESPIRATORY_TRACT | Status: DC

## 2022-06-05 MED ORDER — PREDNISONE 10 MG PO TABS: 40.0000 mg | ORAL_TABLET | Freq: Every day | ORAL | 0 refills | Status: AC

## 2022-06-05 MED ORDER — DIPHENHYDRAMINE HCL 50 MG/ML IJ SOLN
12.5000 mg | Freq: Once | INTRAMUSCULAR | Status: AC
  Administered 2022-06-05: 12.5 mg via INTRAVENOUS
  Filled 2022-06-05: qty 1

## 2022-06-05 MED ORDER — KETOROLAC TROMETHAMINE 15 MG/ML IJ SOLN
15.0000 mg | Freq: Once | INTRAMUSCULAR | Status: AC
  Administered 2022-06-05: 15 mg via INTRAVENOUS
  Filled 2022-06-05: qty 1

## 2022-06-05 MED ORDER — ALBUTEROL SULFATE (2.5 MG/3ML) 0.083% IN NEBU
10.0000 mg/h | INHALATION_SOLUTION | Freq: Once | RESPIRATORY_TRACT | Status: AC
  Administered 2022-06-05: 10 mg/h via RESPIRATORY_TRACT
  Filled 2022-06-05: qty 12
  Filled 2022-06-05: qty 0.5

## 2022-06-05 MED ORDER — SODIUM CHLORIDE 0.9 % IV BOLUS
500.0000 mL | Freq: Once | INTRAVENOUS | Status: AC
  Administered 2022-06-05: 500 mL via INTRAVENOUS

## 2022-06-05 MED ORDER — METHYLPREDNISOLONE SODIUM SUCC 125 MG IJ SOLR
125.0000 mg | Freq: Once | INTRAMUSCULAR | Status: AC
  Administered 2022-06-05: 125 mg via INTRAVENOUS
  Filled 2022-06-05: qty 2

## 2022-06-05 NOTE — ED Triage Notes (Signed)
Exposed on Sunday to covid.  Complains of malaise cough and headache.

## 2022-06-05 NOTE — ED Provider Triage Note (Signed)
Emergency Medicine Provider Triage Evaluation Note  Richard [name] , a 61 y.o. male  was evaluated in triage.  Pt complains of COVID exposure.  Reports he was around choir members who had Richard [name] on Sunday.  He started to have symptoms yesterday.  Was notified today that the other members have Richard [name].  Complaining of body aches, headaches and wheezing.  History of diabetes and asthma.  Review of Systems  Positive: Body aches, tight breathing, concern for COVID Negative:   Physical Exam  BP 116/84 (BP Location: Right Arm)   Pulse (!) 102   Temp 98.8 F (37.1 C) (Oral)   Resp 18   Ht '6\' 2"'$  (1.88 m)   Wt 99.8 kg   SpO2 100%   BMI 28.25 kg/m  Gen:   Awake, no distress   Resp:  Normal effort  MSK:   Moves extremities without difficulty  Other:  Wheezing in bilateral upper lung fields  Medical Decision Making  Medically screening exam initiated at 4:12 PM.  Appropriate orders placed.  Richard [name] was informed that the remainder of the evaluation will be completed by another provider, this initial triage assessment does not replace that evaluation, and the importance of remaining in the ED until their evaluation is complete.  Patient very anxious   Richard Hammock, PA-C 06/05/22 1614

## 2022-06-05 NOTE — ED Notes (Signed)
Patient finished first neb treatment and stated much improvement and also stated he did not feel he needed another one.

## 2022-06-05 NOTE — Discharge Instructions (Addendum)
You have been seen today for your complaint of shortness of breath, headache. Your lab work showed you are COVID-negative.. Your imaging was reassuring and showed no abnormalities. Your discharge medications include prednisone.  This is a steroid.  You should take it as prescribed.  This may increase your blood sugar levels, you should monitor this more closely over the next 5 days.  I am also sending in a refill of your albuterol nebulized solution. Home care instructions are as follows:  You should continue to eat and drink as You normally would.  You should wear a mask around others until symptoms resolve. Follow up with: Your primary care provider within 1 week Please seek immediate medical care if you develop any of the following symptoms: Your shortness of breath gets worse. You have trouble breathing when you are resting. You feel light-headed or you faint. You have a cough that is not helped by medicines. You cough up blood. You have pain with breathing. You have pain in your chest, arms, shoulders, or belly (abdomen). You have a fever. At this time there does not appear to be the presence of an emergent medical condition, however there is always the potential for conditions to change. Please read and follow the below instructions.  Do not take your medicine if  develop an itchy rash, swelling in your mouth or lips, or difficulty breathing; call 911 and seek immediate emergency medical attention if this occurs.  You may review your lab tests and imaging results in their entirety on your MyChart account.  Please discuss all results of fully with your primary care provider and other specialist at your follow-up visit.  Note: Portions of this text may have been transcribed using voice recognition software. Every effort was made to ensure accuracy; however, inadvertent computerized transcription errors may still be present.

## 2022-06-05 NOTE — ED Provider Notes (Signed)
[facility] EMERGENCY DEPARTMENT Provider Note   CSN: [id number] Arrival date & time: 06/05/22  1605     History  Chief Complaint  Patient presents with   Covid Exposure    Richard [name] is a 61 y.o. male.  With a history of COPD, asthma, type 2 diabetes, hypertension who presents to the ED for evaluation of 2 days of subjective fevers, shortness of breath, headache, chest congestion, dry cough.  He states that 2 of his coworkers tested positive for COVID 3 days ago.  He states he woke up yesterday with symptoms.  Reports headache to be pounding, moderate intensity, localizes to the entire head.  Patient is also complaining of fatigue and weakness.  Patient has nebulizer treatments at home for his COPD and asthma, but states he ran out and has not refilled his prescription.  HPI     Home Medications Prior to Admission medications   Medication Sig Start Date End Date Taking? Authorizing Provider  Accu-Chek FastClix Lancets MISC Check blood sugar two times a day 09/07/20   [name], DO  Accu-Chek Softclix Lancets lancets Check blood sugar two times a day, 03/22/21   Axel Filler, MD  albuterol (PROVENTIL) (2.5 MG/3ML) 0.083% nebulizer solution Take 3 mLs (2.5 mg total) by nebulization every 6 (six) hours as needed for wheezing or shortness of breath. 12/14/21   [name], DO  albuterol (VENTOLIN HFA) 108 (90 Base) MCG/ACT inhaler Inhale 1-2 puffs into the lungs every 4 (four) hours as needed for wheezing or shortness of breath. INHALE 2 PUFFS INTO THE LUNGS EVERY 4 HOURS AS NEEDED FOR WHEEZING OR SHORTNESS OF BREATH 12/14/21   [name], DO  ascorbic acid (VITAMIN C) 500 MG tablet Take 500 mg by mouth daily.    [provider]  aspirin EC 81 MG EC tablet Take 1 tablet (81 mg total) by mouth daily. 09/26/15   Burns, Arloa Koh, MD  atorvastatin (LIPITOR) 40 MG tablet Take 1 tablet (40 mg total) by mouth daily. 08/17/21   [name], DO   Blood Glucose Monitoring Suppl (ACCU-CHEK GUIDE) w/Device KIT USE TO CHECK BLOOD SUGAR TWICE DAILY 08/16/21   [name], DO  diclofenac sodium (VOLTAREN) 1 % GEL Apply 4 g topically 4 (four) times daily as needed. Patient taking differently: Apply 4 g topically daily. 03/26/19   [name], DO  Dulaglutide (TRULICITY) 3 VQ/9.4HW SOPN Inject 3 mg as directed once a week. 12/14/21   [name], DO  Empagliflozin-metFORMIN HCl (SYNJARDY) 12.02-999 MG TABS Take 1 tablet by mouth 2 (two) times daily. 03/08/22   [name], DO  glucose blood (ACCU-CHEK GUIDE) test strip USE TO CHECK BLOOD SUGARS TWICE DAILY 08/16/21   [name], DO  ibuprofen (ADVIL) 200 MG tablet Take 200 mg by mouth every 6 (six) hours as needed for mild pain.    [provider]  ibuprofen (ADVIL,MOTRIN) 600 MG tablet Take 1 tablet (600 mg total) by mouth every 6 (six) hours as needed. 11/25/18   Julianne Rice, MD  ketorolac (ACULAR) 0.4 % SOLN Place 1 drop into the left eye 4 (four) times daily. 09/18/21   [provider]  Lancets Misc. Patricia Pesa 2 NORMAL) MISC Check blood sugar two times a day 06/02/18   [provider]  latanoprost (XALATAN) 0.005 % ophthalmic solution Place 1 drop into both eyes at bedtime.  05/05/20   [provider]  levocetirizine Harlow Ohms) 5  MG tablet Take 1 tablet (5 mg total) by mouth every evening. 03/08/22   [name], DO  mometasone-formoterol (DULERA) 200-5 MCG/ACT AERO Inhale 2 puffs into the lungs 2 (two) times daily. 03/08/22   [name], DO  montelukast (SINGULAIR) 10 MG tablet Take 1 tablet (10 mg total) by mouth at bedtime. 03/08/22   [name], DO  mupirocin ointment (BACTROBAN) 2 % Apply 1 application topically 2 (two) times daily. 05/29/21   Bronson Ing, DPM  nitroGLYCERIN (NITRODUR - DOSED IN MG/24 HR) 0.2 mg/hr patch Place 1 patch (0.2 mg total) onto the skin daily. 09/28/20   Persons, Bevely Palmer, PA  ofloxacin (OCUFLOX) 0.3  % ophthalmic solution Place 1 drop into the left eye 4 (four) times daily. 09/18/21   [provider]  pentoxifylline (TRENTAL) 400 MG CR tablet Take 1 tablet (400 mg total) by mouth 3 (three) times daily with meals. 09/28/20   Persons, Bevely Palmer, PA  pioglitazone (ACTOS) 30 MG tablet Take 1 tablet (30 mg total) by mouth daily. 08/17/21   [name], DO  prednisoLONE acetate (PRED FORTE) 1 % ophthalmic suspension Place 1 drop into the left eye 4 (four) times daily. 09/19/21   [provider]  pregabalin (LYRICA) 100 MG capsule Take 1 capsule (100 mg total) by mouth 3 (three) times daily. 03/08/22   [name], DO  silver sulfADIAZINE (SILVADENE) 1 % cream Apply pea-sized amount to wound daily. 01/25/22   Landis Martins, DPM  triamcinolone ointment (KENALOG) 0.1 % Apply 1 application topically 2 (two) times daily as needed. 01/01/19   [name], DO      Allergies    Shellfish allergy    Review of Systems   Review of Systems  Constitutional:  Positive for fatigue and fever. Negative for chills.  HENT:  Negative for congestion, postnasal drip, rhinorrhea, sinus pressure, sinus pain and sore throat.   Respiratory:  Positive for cough, chest tightness, shortness of breath and wheezing.   Cardiovascular:  Positive for chest pain.  Neurological:  Positive for light-headedness and headaches. Negative for dizziness, weakness and numbness.    Physical Exam Updated Vital Signs BP 123/73 (BP Location: Right Arm)   Pulse 95   Temp 98.5 F (36.9 C) (Oral)   Resp 18   Ht $R'6\' 2"'Ln$  (1.88 m)   Wt 99.8 kg   SpO2 97%   BMI 28.25 kg/m  Physical Exam Vitals and nursing note reviewed.  Constitutional:      General: He is in acute distress (Struggling to breathe, appears fatigued).     Appearance: He is well-developed. He is not ill-appearing or toxic-appearing.  HENT:     Head: Normocephalic and atraumatic.     Mouth/Throat:     Mouth: Mucous membranes are moist.      Pharynx: Oropharynx is clear. Posterior oropharyngeal erythema present. No oropharyngeal exudate.  Eyes:     Extraocular Movements: Extraocular movements intact.     Conjunctiva/sclera: Conjunctivae normal.     Pupils: Pupils are equal, round, and reactive to light.  Cardiovascular:     Rate and Rhythm: Normal rate and regular rhythm.     Pulses: Normal pulses.     Heart sounds: Normal heart sounds. No murmur heard. Pulmonary:     Effort: Pulmonary effort is normal. No respiratory distress.     Breath sounds: Normal breath sounds.  Abdominal:     General: Abdomen is flat.     Palpations: Abdomen  is soft.     Tenderness: There is no abdominal tenderness.  Musculoskeletal:        General: No swelling. Normal range of motion.     Cervical back: Neck supple.  Skin:    General: Skin is warm and dry.     Capillary Refill: Capillary refill takes less than 2 seconds.  Neurological:     General: No focal deficit present.     Mental Status: He is alert and oriented to person, place, and time.     Comments: No pronator drift, no facial asymmetry, no unilateral or global weakness  Psychiatric:        Mood and Affect: Mood normal.     ED Results / Procedures / Treatments   Labs (all labs ordered are listed, but only abnormal results are displayed) Labs Reviewed  SARS CORONAVIRUS 2 BY RT PCR  CBC WITH DIFFERENTIAL/PLATELET  BASIC METABOLIC PANEL  CBG MONITORING, ED    EKG None  Radiology DG Chest 2 View  Result Date: 06/05/2022 CLINICAL DATA:  Provided history: Shortness of breath. Additional history provided: COVID exposure, body aches, headaches, wheezing, history of diabetes and asthma. EXAM: CHEST - 2 VIEW COMPARISON:  Prior chest radiographs 07/31/2020 and earlier. FINDINGS: Heart size within normal limits. No appreciable airspace consolidation. No evidence of pleural effusion or pneumothorax. No acute bony abnormality identified. IMPRESSION: No evidence of acute cardiopulmonary  abnormality. Electronically Signed   By: Kellie Simmering D.O.   On: 06/05/2022 16:39    Procedures Procedures    Medications Ordered in ED Medications  ipratropium-albuterol (DUONEB) 0.5-2.5 (3) MG/3ML nebulizer solution 3 mL (has no administration in time range)    ED Course/ Medical Decision Making/ A&P Clinical Course as of 06/05/22 2152  Tue Jun 05, 2022  2136 DG Chest 2 View I personally reviewed and interpreted the images.  No cardiopulmonary abnormality [AS]    Clinical Course User Index [AS] Prinston Kynard, Grafton Folk, PA-C                           Medical Decision Making Amount and/or Complexity of Data Reviewed Labs: ordered.  Risk Prescription drug management.  This patient presents to the ED for concern of chest congestion, shortness of breath, body aches, fevers, this involves an extensive number of treatment options, and is a complaint that carries with it a high risk of complications and morbidity.  The differential diagnosis includes viral URI, asthma exacerbation, COPD exacerbation   Co morbidities that complicate the patient evaluation  COPD, asthma, hypertension  My initial workup includes basic labs, chest x-ray, continuous DuoNeb treatment, 500 cc fluid bolus, steroid, headache cocktail  Additional history obtained from: Nursing notes from this visit. Previous records within EMR system office visit on 09/14/2019 for viral URI  I ordered, reviewed and interpreted labs which include:COVID, CBG.  COVID-negative CBG 97 normal.    I ordered imaging studies including chest x-ray I independently visualized and interpreted imaging which showed no acute cardiopulmonary abnormality I agree with the radiologist interpretation  Cardiac Monitoring:  The patient was maintained on a cardiac monitor.  I personally viewed and interpreted the cardiac monitored which showed an underlying rhythm of: NSR  Afebrile, hemodynamically stable.  Patient reported significant  improvement after interventions of albuterol, Decadron, headache cocktail and fluids.  Reevaluation showed no inspiratory or expiratory wheezes on auscultation.  Headache was reduced from a 10 out of 10 to a 4 out of 10.  I believe patient had an asthma exacerbation secondary to viral URI.  Chest x-ray showed no abnormalities, specifically no pneumonia.  Patient be sent home with a steroid burst of prednisone and a refill of his nebulizer albuterol solution.  Patient was informed that he should monitor his blood sugar more closely while taking steroids.  He was given strict return precautions.  Stable at the time of discharge.  At this time there does not appear to be any evidence of an acute emergency medical condition and the patient appears stable for discharge with appropriate outpatient follow up. Diagnosis was discussed with patient who verbalizes understanding of care plan and is agreeable to discharge. I have discussed return precautions with patient who verbalizes understanding. Patient encouraged to follow-up with their PCP within 1 week. All questions answered.  Patient's case discussed with Dr. Sabra Heck who agrees with plan to discharge with follow-up.   Note: Portions of this report may have been transcribed using voice recognition software. Every effort was made to ensure accuracy; however, inadvertent computerized transcription errors may still be present.          Final Clinical Impression(s) / ED Diagnoses Final diagnoses:  None    Rx / DC Orders ED Discharge Orders     None         Nehemiah Massed 06/05/22 2347    Noemi Chapel, MD 06/06/22 [phone]

## 2022-06-06 NOTE — ED Notes (Signed)
DC instructions reviewed with pt. Pt verbalized understanding.  PT DC.  

## 2022-06-10 ENCOUNTER — Other Ambulatory Visit: Payer: Self-pay

## 2022-06-10 ENCOUNTER — Encounter (HOSPITAL_COMMUNITY): Payer: Self-pay | Admitting: Emergency Medicine

## 2022-06-10 ENCOUNTER — Emergency Department (HOSPITAL_COMMUNITY)
Admission: EM | Admit: 2022-06-10 | Discharge: 2022-06-10 | Disposition: A | Discharge status: Home / Self Care | Payer: Medicare Other | Attending: Emergency Medicine | Admitting: Emergency Medicine

## 2022-06-10 ENCOUNTER — Emergency Department (HOSPITAL_COMMUNITY): Payer: Medicare Other

## 2022-06-10 DIAGNOSIS — J45909 Unspecified asthma, uncomplicated: Secondary | ICD-10-CM | POA: Insufficient documentation

## 2022-06-10 DIAGNOSIS — Z7982 Long term (current) use of aspirin: Secondary | ICD-10-CM | POA: Diagnosis not present

## 2022-06-10 DIAGNOSIS — R0602 Shortness of breath: Secondary | ICD-10-CM | POA: Diagnosis not present

## 2022-06-10 DIAGNOSIS — E1165 Type 2 diabetes mellitus with hyperglycemia: Secondary | ICD-10-CM | POA: Insufficient documentation

## 2022-06-10 DIAGNOSIS — I2510 Atherosclerotic heart disease of native coronary artery without angina pectoris: Secondary | ICD-10-CM | POA: Diagnosis not present

## 2022-06-10 DIAGNOSIS — Z20822 Contact with and (suspected) exposure to covid-19: Secondary | ICD-10-CM | POA: Insufficient documentation

## 2022-06-10 DIAGNOSIS — Z7952 Long term (current) use of systemic steroids: Secondary | ICD-10-CM | POA: Diagnosis not present

## 2022-06-10 DIAGNOSIS — R0789 Other chest pain: Secondary | ICD-10-CM | POA: Diagnosis not present

## 2022-06-10 LAB — CBC WITH DIFFERENTIAL/PLATELET
Abs Immature Granulocytes: 0.06 10*3/uL (ref 0.00–0.07)
Basophils Absolute: 0.1 10*3/uL (ref 0.0–0.1)
Basophils Relative: 1 %
Eosinophils Absolute: 0.7 10*3/uL — ABNORMAL HIGH (ref 0.0–0.5)
Eosinophils Relative: 12 %
HCT: 44.7 % (ref 39.0–52.0)
Hemoglobin: 14 g/dL (ref 13.0–17.0)
Immature Granulocytes: 1 %
Lymphocytes Relative: 14 %
Lymphs Abs: 0.8 10*3/uL (ref 0.7–4.0)
MCH: 26.8 pg (ref 26.0–34.0)
MCHC: 31.3 g/dL (ref 30.0–36.0)
MCV: 85.6 fL (ref 80.0–100.0)
Monocytes Absolute: 0.5 10*3/uL (ref 0.1–1.0)
Monocytes Relative: 9 %
Neutro Abs: 3.6 10*3/uL (ref 1.7–7.7)
Neutrophils Relative %: 63 %
Platelets: 259 10*3/uL (ref 150–400)
RBC: 5.22 MIL/uL (ref 4.22–5.81)
RDW: 14.3 % (ref 11.5–15.5)
WBC: 5.8 10*3/uL (ref 4.0–10.5)
nRBC: 0 % (ref 0.0–0.2)

## 2022-06-10 LAB — BASIC METABOLIC PANEL
Anion gap: 8 (ref 5–15)
BUN: 12 mg/dL (ref 8–23)
CO2: 21 mmol/L — ABNORMAL LOW (ref 22–32)
Calcium: 8.6 mg/dL — ABNORMAL LOW (ref 8.9–10.3)
Chloride: 107 mmol/L (ref 98–111)
Creatinine, Ser: 0.97 mg/dL (ref 0.61–1.24)
GFR, Estimated: >60 mL/min (ref >60)
Glucose, Bld: 191 mg/dL — ABNORMAL HIGH (ref 70–99)
Potassium: 4.6 mmol/L (ref 3.5–5.1)
Sodium: 136 mmol/L (ref 135–145)

## 2022-06-10 LAB — TROPONIN I (HIGH SENSITIVITY)
Troponin I (High Sensitivity): 8 ng/L (ref <18)
Troponin I (High Sensitivity): 6 ng/L (ref <18)

## 2022-06-10 LAB — CBG MONITORING, ED: Glucose-Capillary: 211 mg/dL — ABNORMAL HIGH (ref 70–99)

## 2022-06-10 LAB — SARS CORONAVIRUS 2 BY RT PCR: SARS Coronavirus 2 by RT PCR: NEGATIVE

## 2022-06-10 MED ORDER — IPRATROPIUM-ALBUTEROL 0.5-2.5 (3) MG/3ML IN SOLN
3.0000 mL | Freq: Once | RESPIRATORY_TRACT | Status: AC
  Administered 2022-06-10: 3 mL via RESPIRATORY_TRACT
  Filled 2022-06-10: qty 3

## 2022-06-10 MED ORDER — DEXAMETHASONE SODIUM PHOSPHATE 10 MG/ML IJ SOLN
10.0000 mg | Freq: Once | INTRAMUSCULAR | Status: AC
  Administered 2022-06-10: 10 mg via INTRAMUSCULAR
  Filled 2022-06-10: qty 1

## 2022-06-10 MED ORDER — ONDANSETRON HCL 4 MG/2ML IJ SOLN
4.0000 mg | Freq: Once | INTRAMUSCULAR | Status: AC
  Administered 2022-06-10: 4 mg via INTRAVENOUS
  Filled 2022-06-10: qty 2

## 2022-06-10 MED ORDER — KETOROLAC TROMETHAMINE 15 MG/ML IJ SOLN
15.0000 mg | Freq: Once | INTRAMUSCULAR | Status: AC
  Administered 2022-06-10: 15 mg via INTRAVENOUS
  Filled 2022-06-10: qty 1

## 2022-06-10 MED ORDER — ALBUTEROL SULFATE HFA 108 (90 BASE) MCG/ACT IN AERS
1.0000 | INHALATION_SPRAY | Freq: Once | RESPIRATORY_TRACT | Status: AC
  Administered 2022-06-10: 1 via RESPIRATORY_TRACT
  Filled 2022-06-10: qty 6.7

## 2022-06-10 MED ORDER — MORPHINE SULFATE (PF) 4 MG/ML IV SOLN
4.0000 mg | Freq: Once | INTRAVENOUS | Status: AC
  Administered 2022-06-10: 4 mg via INTRAVENOUS
  Filled 2022-06-10: qty 1

## 2022-06-10 NOTE — ED Provider Notes (Signed)
[place] EMERGENCY DEPARTMENT Provider Note   CSN: [id number] Arrival date & time: 06/10/22  0800    History  Chief Complaint  Patient presents with   Shortness of Breath   Chest Pain    Richard [name] is a 61 y.o. male with a PMHx of diabetes, asthma who presents to the ED complaining of shortness of breath onset 2 nights.  Has associated right-sided chest pain onset 2 days.  Denies recent injury, trauma, fall.  Denies past medical history of MI, cardiac catheterization, stents, echocardiogram, stress test.  Has tried his albuterol inhaler and nebulizer machine without relief.  Notes that he did not pick up the steroid prescription that was prescribed last time he was in the emergency department.  Denies nausea, vomiting, cough.   Per patient chart review: Pt was evaluated in the ED on 06/05/2022 for similar symptoms.  Was sent with a prescription for an albuterol nebulizer, inhaler, prednisone.  The history is provided by the patient. No language interpreter was used.       Home Medications Prior to Admission medications   Medication Sig Start Date End Date Taking? Authorizing Provider  Accu-Chek FastClix Lancets MISC Check blood sugar two times a day 09/07/20   [name], DO  Accu-Chek Softclix Lancets lancets Check blood sugar two times a day, 03/22/21   Axel Filler, MD  albuterol (PROVENTIL) (2.5 MG/3ML) 0.083% nebulizer solution Take 3 mLs (2.5 mg total) by nebulization every 6 (six) hours as needed for wheezing or shortness of breath. 12/14/21   [name], DO  albuterol (PROVENTIL) (2.5 MG/3ML) 0.083% nebulizer solution Take 3 mLs (2.5 mg total) by nebulization every 6 (six) hours as needed for wheezing or shortness of breath. 06/05/22   Schutt, Grafton Folk, PA-C  albuterol (VENTOLIN HFA) 108 (90 Base) MCG/ACT inhaler Inhale 1-2 puffs into the lungs every 4 (four) hours as needed for wheezing or shortness of breath. INHALE 2 PUFFS INTO THE  LUNGS EVERY 4 HOURS AS NEEDED FOR WHEEZING OR SHORTNESS OF BREATH 12/14/21   [name], DO  ascorbic acid (VITAMIN C) 500 MG tablet Take 500 mg by mouth daily.    [provider]  aspirin EC 81 MG EC tablet Take 1 tablet (81 mg total) by mouth daily. 09/26/15   Burns, Arloa Koh, MD  atorvastatin (LIPITOR) 40 MG tablet Take 1 tablet (40 mg total) by mouth daily. 08/17/21   [name], DO  Blood Glucose Monitoring Suppl (ACCU-CHEK GUIDE) w/Device KIT USE TO CHECK BLOOD SUGAR TWICE DAILY 08/16/21   [name], DO  diclofenac sodium (VOLTAREN) 1 % GEL Apply 4 g topically 4 (four) times daily as needed. Patient taking differently: Apply 4 g topically daily. 03/26/19   [name], DO  Dulaglutide (TRULICITY) 3 NO/7.0JG SOPN Inject 3 mg as directed once a week. 12/14/21   [name], DO  Empagliflozin-metFORMIN HCl (SYNJARDY) 12.02-999 MG TABS Take 1 tablet by mouth 2 (two) times daily. 03/08/22   [name], DO  glucose blood (ACCU-CHEK GUIDE) test strip USE TO CHECK BLOOD SUGARS TWICE DAILY 08/16/21   [name], DO  ibuprofen (ADVIL) 200 MG tablet Take 200 mg by mouth every 6 (six) hours as needed for mild pain.    [provider]  ibuprofen (ADVIL,MOTRIN) 600 MG tablet Take 1 tablet (600 mg total) by mouth every 6 (six) hours as needed. 11/25/18   Julianne Rice, MD  ketorolac (ACULAR) 0.4 %  SOLN Place 1 drop into the left eye 4 (four) times daily. 09/18/21   [provider]  Lancets Misc. Patricia Pesa 2 NORMAL) MISC Check blood sugar two times a day 06/02/18   [provider]  latanoprost (XALATAN) 0.005 % ophthalmic solution Place 1 drop into both eyes at bedtime.  05/05/20   [provider]  levocetirizine (XYZAL) 5 MG tablet Take 1 tablet (5 mg total) by mouth every evening. 03/08/22   [name], DO  mometasone-formoterol (DULERA) 200-5 MCG/ACT AERO Inhale 2 puffs into the lungs 2 (two) times daily. 03/08/22   [name], DO   montelukast (SINGULAIR) 10 MG tablet Take 1 tablet (10 mg total) by mouth at bedtime. 03/08/22   [name], DO  mupirocin ointment (BACTROBAN) 2 % Apply 1 application topically 2 (two) times daily. 05/29/21   Bronson Ing, DPM  nitroGLYCERIN (NITRODUR - DOSED IN MG/24 HR) 0.2 mg/hr patch Place 1 patch (0.2 mg total) onto the skin daily. 09/28/20   Persons, Bevely Palmer, PA  ofloxacin (OCUFLOX) 0.3 % ophthalmic solution Place 1 drop into the left eye 4 (four) times daily. 09/18/21   [provider]  pentoxifylline (TRENTAL) 400 MG CR tablet Take 1 tablet (400 mg total) by mouth 3 (three) times daily with meals. 09/28/20   Persons, Bevely Palmer, PA  pioglitazone (ACTOS) 30 MG tablet Take 1 tablet (30 mg total) by mouth daily. 08/17/21   [name], DO  prednisoLONE acetate (PRED FORTE) 1 % ophthalmic suspension Place 1 drop into the left eye 4 (four) times daily. 09/19/21   [provider]  predniSONE (DELTASONE) 10 MG tablet Take 4 tablets (40 mg total) by mouth daily with breakfast for 5 days. 06/05/22 06/10/22  Schutt, Grafton Folk, PA-C  pregabalin (LYRICA) 100 MG capsule Take 1 capsule (100 mg total) by mouth 3 (three) times daily. 03/08/22   [name], DO  silver sulfADIAZINE (SILVADENE) 1 % cream Apply pea-sized amount to wound daily. 01/25/22   Landis Martins, DPM  triamcinolone ointment (KENALOG) 0.1 % Apply 1 application topically 2 (two) times daily as needed. 01/01/19   [name], DO      Allergies    Shellfish allergy    Review of Systems   Review of Systems  Respiratory:  Positive for shortness of breath. Negative for cough.   Cardiovascular:  Positive for chest pain.  Gastrointestinal:  Negative for nausea and vomiting.  All other systems reviewed and are negative.   Physical Exam Updated Vital Signs BP 135/82 (BP Location: Right Arm)   Pulse 91   Temp 98.1 F (36.7 C) (Oral)   Resp 17   Ht $R'6\' 2"'Hq$  (1.88 m)   Wt 99.8 kg   SpO2 96%   BMI  28.25 kg/m  Physical Exam Vitals and nursing note reviewed.  Constitutional:      General: He is not in acute distress.    Appearance: He is not diaphoretic.  HENT:     Head: Normocephalic and atraumatic.     Mouth/Throat:     Pharynx: No oropharyngeal exudate.  Eyes:     General: No scleral icterus.    Conjunctiva/sclera: Conjunctivae normal.  Cardiovascular:     Rate and Rhythm: Normal rate and regular rhythm.     Pulses: Normal pulses.     Heart sounds: Normal heart sounds.  Pulmonary:     Effort: Pulmonary effort is normal. No respiratory distress.     Breath sounds:  Wheezing present.     Comments: Wheezing noted diffusely.  Mild increased work of breathing. Abdominal:     General: Bowel sounds are normal.     Palpations: Abdomen is soft. There is no mass.     Tenderness: There is no abdominal tenderness. There is no guarding or rebound.  Musculoskeletal:        General: Normal range of motion.     Cervical back: Normal range of motion and neck supple.  Skin:    General: Skin is warm and dry.  Neurological:     Mental Status: He is alert.  Psychiatric:        Behavior: Behavior normal.     ED Results / Procedures / Treatments   Labs (all labs ordered are listed, but only abnormal results are displayed) Labs Reviewed  BASIC METABOLIC PANEL - Abnormal; Notable for the following components:      Result Value   CO2 21 (*)    Glucose, Bld 191 (*)    Calcium 8.6 (*)    All other components within normal limits  CBC WITH DIFFERENTIAL/PLATELET - Abnormal; Notable for the following components:   Eosinophils Absolute 0.7 (*)    All other components within normal limits  CBG MONITORING, ED - Abnormal; Notable for the following components:   Glucose-Capillary 211 (*)    All other components within normal limits  SARS CORONAVIRUS 2 BY RT PCR  TROPONIN I (HIGH SENSITIVITY)  TROPONIN I (HIGH SENSITIVITY)    EKG None  Radiology DG Chest 2 View  Result Date:  06/10/2022 CLINICAL DATA:  Short of breath and chest pain for 1 week. History of asthma. EXAM: CHEST - 2 VIEW COMPARISON:  06/05/2022.  CT, 07/19/2020. FINDINGS: Cardiac silhouette is normal in size and configuration. Normal mediastinal and hilar contours. Lungs are clear.  No pleural effusion or pneumothorax. Skeletal structures are intact. IMPRESSION: No active cardiopulmonary disease. Electronically Signed   By: Lajean Manes M.D.   On: 06/10/2022 09:07    Procedures Procedures    Medications Ordered in ED Medications  dexamethasone (DECADRON) injection 10 mg (10 mg Intramuscular Given 06/10/22 0849)  ipratropium-albuterol (DUONEB) 0.5-2.5 (3) MG/3ML nebulizer solution 3 mL (3 mLs Nebulization Given 06/10/22 0850)  ketorolac (TORADOL) 15 MG/ML injection 15 mg (15 mg Intravenous Given 06/10/22 1031)  ipratropium-albuterol (DUONEB) 0.5-2.5 (3) MG/3ML nebulizer solution 3 mL (3 mLs Nebulization Given 06/10/22 1031)  morphine (PF) 4 MG/ML injection 4 mg (4 mg Intravenous Given 06/10/22 1140)  ondansetron (ZOFRAN) injection 4 mg (4 mg Intravenous Given 06/10/22 1140)  albuterol (VENTOLIN HFA) 108 (90 Base) MCG/ACT inhaler 1 puff (1 puff Inhalation Given 06/10/22 1320)    ED Course/ Medical Decision Making/ A&P Clinical Course as of 06/10/22 1403  Sun Jun 10, 2022  0953 Repeat auscultation, lung sounds improved following breathing treatment in the ED.  Will provide patient with another breathing treatment.  Patient also requesting meds for pain for his chest wall pain. [SB]  1046 Pt re-evaluated and auscultation of lungs noted improved lung sounds. Patient with improvement of symptoms in the ED. Noted that he felt dizzy, informed patient that we will check his CBG.  [SB]  1123 Glucose-Capillary(!): 211 [SB]  1236 Re-evaluated and asleep on stretcher. Patient with resolution of symptoms, troponin down trending. Will provide with referral to cardiology. Answered all available questions. Pt appears safe for  discharge.  [SB]    Clinical Course User Index [SB] Cherylyn Sundby A, PA-C  Medical Decision Making Amount and/or Complexity of Data Reviewed Labs: ordered. Decision-making details documented in ED Course. Radiology: ordered.  Risk Prescription drug management.   Pt presents with concerns for shortness of breath and chest pain onset last night.  Has tried his inhaler and nebulizer without relief.  Was evaluated several days ago for similar symptoms.  Denies cardiac history of MI, cardiac, exertion, stents.  Has a cardiologist at this time.  Patient afebrile, not tachycardic or hypoxic.  On exam patient without acute cardiovascular, respiratory exam findings.  Differential diagnosis includes ACS, pneumonia, PTX, COPD exacerbation, asthma exacerbation.   Co morbidities that complicate the patient evaluation: Hypertension Diabetes CAD   Labs:  I ordered, and personally interpreted labs.  The pertinent results include:   CBC without leukocytosis Initial troponin at 8 delta troponin at 6 BMP with elevated glucose at 191 otherwise unremarkable CBG elevated at 211 COVID swab negative  Imaging: I ordered imaging studies including Chest x-ray I independently visualized and interpreted imaging which showed: No acute cardiopulmonary findings I agree with the radiologist interpretation  Medications:  I ordered medication including Zofran, Morphine, and Toradol, DuoNeb x2 for symptom management Reevaluation of the patient after these medicines and interventions, I reevaluated the patient and found that they have improved I have reviewed the patients home medicines and have made adjustments as needed    Disposition: Presentation suspicious for asthma exacerbation.  Also suspicious for atypical chest pain.  At this time doubt ACS, pneumonia, pneumothorax. After consideration of the diagnostic results and the patients response to treatment, I feel that the patient  would benefit from Discharge home.  Patient provided with ambulatory referral to his cardiologist for follow-up regarding today's ED visit.  Provided with an albuterol inhaler today in the emergency department.  Instructed patient to take his prescribed prednisone as directed.  Instructed patient to follow-up with primary care provider regarding today's ED visit.  Supportive care measures and strict return precautions discussed with patient at bedside. Pt acknowledges and verbalizes understanding. Pt appears safe for discharge. Follow up as indicated in discharge paperwork.    This chart was dictated using voice recognition software, Dragon. Despite the best efforts of this provider to proofread and correct errors, errors may still occur which can change documentation meaning.   Final Clinical Impression(s) / ED Diagnoses Final diagnoses:  Atypical chest pain  Shortness of breath    Rx / DC Orders ED Discharge Orders          Ordered    Ambulatory referral to Cardiology       Comments: If you have not heard from the Cardiology office within the next 72 hours please call [phone].   06/10/22 1319              Rayansh Herbst A, PA-C 06/10/22 1403    Dorie Rank, MD 06/11/22 [id number]

## 2022-06-10 NOTE — ED Triage Notes (Signed)
Patient c/o shortness of breath and chest pain worsening last night. States no relief from his inhaler or nebulizer machine. Was seen here a few days ago for same c/o. Reports chest pain to right chest.

## 2022-06-10 NOTE — Discharge Instructions (Addendum)
It was a pleasure taking care of you today!   Your workup was negative in the ED. You may apply ice or heat to the affected area for 15 minutes at a time.  Ensure to place a barrier between your skin and and the ice.  You may use over-the-counter 1,000 mg Tylenol every 6 hours or 600 mg ibuprofen every 6 hours as needed for pain.  Follow-up with your primary care provider for evaluation of your symptoms. You have been given an ambulatory referral to cardiology, they will call to set up an appointment. You may return to the ED if you are experiencing increasing/worsening chest pain, shortness of breath, or worsening symptoms.

## 2022-06-14 ENCOUNTER — Other Ambulatory Visit: Payer: Self-pay

## 2022-06-14 ENCOUNTER — Encounter: Payer: Self-pay | Admitting: Internal Medicine

## 2022-06-14 ENCOUNTER — Ambulatory Visit (INDEPENDENT_AMBULATORY_CARE_PROVIDER_SITE_OTHER): Payer: Medicare Other | Admitting: Internal Medicine

## 2022-06-14 VITALS — BP 126/70 | HR 97 | Temp 98.6°F | Ht 74.0 in | Wt 222.3 lb

## 2022-06-14 DIAGNOSIS — Z87891 Personal history of nicotine dependence: Secondary | ICD-10-CM

## 2022-06-14 DIAGNOSIS — J449 Chronic obstructive pulmonary disease, unspecified: Secondary | ICD-10-CM | POA: Diagnosis not present

## 2022-06-14 DIAGNOSIS — Z794 Long term (current) use of insulin: Secondary | ICD-10-CM

## 2022-06-14 DIAGNOSIS — E1151 Type 2 diabetes mellitus with diabetic peripheral angiopathy without gangrene: Secondary | ICD-10-CM

## 2022-06-14 LAB — POCT GLYCOSYLATED HEMOGLOBIN (HGB A1C): Hemoglobin A1C: 8.6 % — AB (ref 4.0–5.6)

## 2022-06-14 LAB — GLUCOSE, CAPILLARY: Glucose-Capillary: 208 mg/dL — ABNORMAL HIGH (ref 70–99)

## 2022-06-14 MED ORDER — ALBUTEROL SULFATE HFA 108 (90 BASE) MCG/ACT IN AERS: 1.0000 | INHALATION_SPRAY | RESPIRATORY_TRACT | 6 refills | Status: DC | PRN

## 2022-06-14 MED ORDER — PIOGLITAZONE HCL 30 MG PO TABS: 30.0000 mg | ORAL_TABLET | Freq: Every day | ORAL | 3 refills | Status: DC

## 2022-06-14 MED ORDER — PREDNISONE 10 MG PO TABS: ORAL_TABLET | ORAL | 0 refills | Status: AC

## 2022-06-14 NOTE — Patient Instructions (Signed)
I want you to make sure you pick up Pioglitasone (Actos) and start taking it again.  Increase your dulera to twice a day.  Once you finish the '40mg'$  of prednisone I will have you decrease to '20mg'$  for 3 days then '10mg'$  for 3 days.

## 2022-06-15 NOTE — Assessment & Plan Note (Signed)
Given he is still symptomatic I am going to extend his steroid course a little longer and taper the dose but overall he appears to be doing much better.  I provided him a peak flow meter and discussed with him monitoring his peak flows and also establishing a baseline when he is feeling back to normal.

## 2022-06-15 NOTE — Progress Notes (Signed)
Established Patient Office Visit  Subjective   Patient ID: Richard [name], male    DOB: 1961/07/26  Age: 61 y.o. MRN: [id number]  Chief Complaint  Patient presents with   Follow-up    ED F/u - CP and Asthma.   Richard [name] presents today for follow-up of 2 ED visits.  He presented on 829 and 9 3 for evaluation of shortness of breath and chest pain.  With viral URI symptoms.  He tested negative for COVID and flu was initially prescribed a 5-day course of prednisone and refilled his nebulizers.  He reports he has felt slightly better but symptoms are still persisting he has 1 day left of the prednisone which does not exactly add up to how it was prescribed.  He notes that his chest pain is intermittent and mostly gone he feels it as a tightness does not have a laterality is improved by nebulizer treatment.  I had his second ED visit he had 2 troponins which were both reassuringly low.  He has noticed that his sugars have run slightly higher since being on the prednisone with fastings in the 1 60-1 80.  In reviewing his diabetic medications it appears he is not filled Actos in last few months and asking him about this he thinks that it must of fallen off and he did not get notified of refill being ready.     Objective:     BP 126/70 (BP Location: Right Arm, Patient Position: Sitting, Cuff Size: Normal)   Pulse 97   Temp 98.6 F (37 C) (Oral)   Ht '6\' 2"'$  (1.88 m)   Wt 222 lb 4.8 oz (100.8 kg)   SpO2 98% Comment: RA  BMI 28.54 kg/m  BP Readings from Last 3 Encounters:  06/14/22 126/70  06/10/22 135/82  06/05/22 124/66   Wt Readings from Last 3 Encounters:  06/14/22 222 lb 4.8 oz (100.8 kg)  06/10/22 220 lb (99.8 kg)  06/05/22 220 lb (99.8 kg)      Physical Exam Constitutional:      Appearance: Normal appearance. He is obese.  Cardiovascular:     Rate and Rhythm: Normal rate and regular rhythm.  Pulmonary:     Effort: Pulmonary effort is normal.     Breath sounds: Normal breath  sounds. No wheezing.     Comments: Two peak expiratory flow measurements of 350 Neurological:     Mental Status: He is alert.  Psychiatric:        Mood and Affect: Mood normal.      Results for orders placed or performed in visit on 06/14/22  Glucose, capillary  Result Value Ref Range   Glucose-Capillary 208 (H) 70 - 99 mg/dL  POC Hbg A1C  Result Value Ref Range   Hemoglobin A1C 8.6 (A) 4.0 - 5.6 %   HbA1c POC (<> result, manual entry)     HbA1c, POC (prediabetic range)     HbA1c, POC (controlled diabetic range)      last hemoglobin A1c Lab Results  Component Value Date   HGBA1C 8.6 (A) 06/14/2022      The ASCVD Risk score (Arnett DK, et al., 2019) failed to calculate for the following reasons:   The valid total cholesterol range is 130 to 320 mg/dL    Assessment & Plan:   Problem List Items Addressed This Visit       Cardiovascular and Mediastinum   Controlled type 2 diabetes mellitus with diabetic peripheral angiopathy without gangrene, without long-term current use  of insulin (HCC) - Primary (Chronic)    A1c still elevated but we will put him back onto his Actos we will continue Synjardy and Trulicity.      Relevant Medications   pioglitazone (ACTOS) 30 MG tablet   Other Relevant Orders   POC Hbg A1C (Completed)   Glucose, capillary (Completed)     Respiratory   Asthma-COPD overlap syndrome (HCC) (Chronic)    Given he is still symptomatic I am going to extend his steroid course a little longer and taper the dose but overall he appears to be doing much better.  I provided him a peak flow meter and discussed with him monitoring his peak flows and also establishing a baseline when he is feeling back to normal.      Relevant Medications   predniSONE (DELTASONE) 10 MG tablet (Start on 06/16/2022)   albuterol (VENTOLIN HFA) 108 (90 Base) MCG/ACT inhaler    Return in about 3 months (around 09/13/2022), or if symptoms worsen or fail to improve.    [name],  DO

## 2022-06-15 NOTE — Assessment & Plan Note (Signed)
A1c still elevated but we will put him back onto his Actos we will continue Synjardy and Trulicity.

## 2022-06-22 ENCOUNTER — Encounter: Payer: Self-pay | Admitting: Interventional Cardiology

## 2022-06-22 ENCOUNTER — Ambulatory Visit: Payer: Medicare Other | Attending: Interventional Cardiology | Admitting: Interventional Cardiology

## 2022-06-22 VITALS — BP 108/60 | HR 91 | Ht 75.0 in | Wt 215.6 lb

## 2022-06-22 DIAGNOSIS — I359 Nonrheumatic aortic valve disorder, unspecified: Secondary | ICD-10-CM | POA: Diagnosis not present

## 2022-06-22 DIAGNOSIS — E782 Mixed hyperlipidemia: Secondary | ICD-10-CM | POA: Diagnosis not present

## 2022-06-22 DIAGNOSIS — I25118 Atherosclerotic heart disease of native coronary artery with other forms of angina pectoris: Secondary | ICD-10-CM | POA: Diagnosis not present

## 2022-06-22 DIAGNOSIS — E1159 Type 2 diabetes mellitus with other circulatory complications: Secondary | ICD-10-CM

## 2022-06-22 NOTE — Patient Instructions (Signed)
Medication Instructions:  Your physician recommends that you continue on your current medications as directed. Please refer to the Current Medication list given to you today.  *If you need a refill on your cardiac medications before your next appointment, please call your pharmacy*   Lab Work: none If you have labs (blood work) drawn today and your tests are completely normal, you will receive your results only by: MyChart Message (if you have MyChart) OR A paper copy in the mail If you have any lab test that is abnormal or we need to change your treatment, we will call you to review the results.   Testing/Procedures: none   Follow-Up: At [facility], you and your health needs are our priority.  As part of our continuing mission to provide you with exceptional heart care, we have created designated Provider Care Teams.  These Care Teams include your primary Cardiologist (physician) and Advanced Practice Providers (APPs -  Physician Assistants and Nurse Practitioners) who all work together to provide you with the care you need, when you need it.  We recommend signing up for the patient portal called "MyChart".  Sign up information is provided on this After Visit Summary.  MyChart is used to connect with patients for Virtual Visits (Telemedicine).  Patients are able to view lab/test results, encounter notes, upcoming appointments, etc.  Non-urgent messages can be sent to your provider as well.   To learn more about what you can do with MyChart, go to [email or web address].    Your next appointment:   12 month(s)  The format for your next appointment:   In Person  Provider:   Jayadeep Varanasi, MD     Other Instructions    Important Information About Sugar       
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
## 2022-06-29 ENCOUNTER — Encounter: Payer: Self-pay | Admitting: Podiatry

## 2022-06-29 ENCOUNTER — Ambulatory Visit (INDEPENDENT_AMBULATORY_CARE_PROVIDER_SITE_OTHER): Payer: Medicare Other | Admitting: Podiatry

## 2022-06-29 DIAGNOSIS — E1140 Type 2 diabetes mellitus with diabetic neuropathy, unspecified: Secondary | ICD-10-CM

## 2022-06-29 DIAGNOSIS — L84 Corns and callosities: Secondary | ICD-10-CM | POA: Diagnosis not present

## 2022-07-02 NOTE — Progress Notes (Signed)
Subjective:   Patient ID: Richard [name], male   DOB: 61 y.o.   MRN: [id number]   HPI Patient presents has had a reoccurrence of a large lesion plantar aspect right that is painful and he needs to get this done he does have long-term diabetes and neuropathic changes    ROS      Objective:  Physical Exam  Chronic thick lesion plantar aspect right forefoot very painful when pressed     Assessment:  Lesion secondary to bone pressure with long-term diabetic neuropathy with high risk for ulceration     Plan:  Aggressive debridement of lesion accomplished no breakdown of tissue was noted underneath it and continue with conservative care currently with offloading diabetic shoes and not going barefoot.  Reappoint for routine care or earlier if needed

## 2022-07-06 ENCOUNTER — Ambulatory Visit: Payer: Medicare Other | Admitting: Podiatry

## 2022-07-17 ENCOUNTER — Encounter (HOSPITAL_COMMUNITY): Payer: Self-pay

## 2022-07-17 ENCOUNTER — Emergency Department (HOSPITAL_COMMUNITY): Payer: Medicare Other

## 2022-07-17 ENCOUNTER — Other Ambulatory Visit: Payer: Self-pay

## 2022-07-17 ENCOUNTER — Inpatient Hospital Stay (HOSPITAL_COMMUNITY)
Admission: EM | Admit: 2022-07-17 | Discharge: 2022-07-21 | DRG: 191 | Disposition: A | Discharge status: Home / Self Care | Payer: Medicare Other | Attending: Internal Medicine | Admitting: Internal Medicine

## 2022-07-17 DIAGNOSIS — E785 Hyperlipidemia, unspecified: Secondary | ICD-10-CM | POA: Diagnosis present

## 2022-07-17 DIAGNOSIS — J441 Chronic obstructive pulmonary disease with (acute) exacerbation: Principal | ICD-10-CM

## 2022-07-17 DIAGNOSIS — Z7984 Long term (current) use of oral hypoglycemic drugs: Secondary | ICD-10-CM

## 2022-07-17 DIAGNOSIS — J4489 Other specified chronic obstructive pulmonary disease: Secondary | ICD-10-CM

## 2022-07-17 DIAGNOSIS — Z7982 Long term (current) use of aspirin: Secondary | ICD-10-CM

## 2022-07-17 DIAGNOSIS — I350 Nonrheumatic aortic (valve) stenosis: Secondary | ICD-10-CM | POA: Diagnosis present

## 2022-07-17 DIAGNOSIS — F419 Anxiety disorder, unspecified: Secondary | ICD-10-CM | POA: Diagnosis present

## 2022-07-17 DIAGNOSIS — Z79899 Other long term (current) drug therapy: Secondary | ICD-10-CM

## 2022-07-17 DIAGNOSIS — D509 Iron deficiency anemia, unspecified: Secondary | ICD-10-CM | POA: Diagnosis present

## 2022-07-17 DIAGNOSIS — J45901 Unspecified asthma with (acute) exacerbation: Secondary | ICD-10-CM | POA: Diagnosis present

## 2022-07-17 DIAGNOSIS — I2510 Atherosclerotic heart disease of native coronary artery without angina pectoris: Secondary | ICD-10-CM | POA: Diagnosis present

## 2022-07-17 DIAGNOSIS — E1165 Type 2 diabetes mellitus with hyperglycemia: Secondary | ICD-10-CM

## 2022-07-17 DIAGNOSIS — I10 Essential (primary) hypertension: Secondary | ICD-10-CM | POA: Diagnosis present

## 2022-07-17 DIAGNOSIS — Z20822 Contact with and (suspected) exposure to covid-19: Secondary | ICD-10-CM | POA: Diagnosis present

## 2022-07-17 DIAGNOSIS — Z87891 Personal history of nicotine dependence: Secondary | ICD-10-CM

## 2022-07-17 DIAGNOSIS — E1140 Type 2 diabetes mellitus with diabetic neuropathy, unspecified: Secondary | ICD-10-CM

## 2022-07-17 DIAGNOSIS — T380X5A Adverse effect of glucocorticoids and synthetic analogues, initial encounter: Secondary | ICD-10-CM | POA: Diagnosis present

## 2022-07-17 DIAGNOSIS — Z91013 Allergy to seafood: Secondary | ICD-10-CM

## 2022-07-17 DIAGNOSIS — E1142 Type 2 diabetes mellitus with diabetic polyneuropathy: Secondary | ICD-10-CM | POA: Diagnosis present

## 2022-07-17 LAB — CBC WITH DIFFERENTIAL/PLATELET
Abs Immature Granulocytes: 0.06 10*3/uL (ref 0.00–0.07)
Basophils Absolute: 0.1 10*3/uL (ref 0.0–0.1)
Basophils Relative: 2 %
Eosinophils Absolute: 0.6 10*3/uL — ABNORMAL HIGH (ref 0.0–0.5)
Eosinophils Relative: 13 %
HCT: 43.3 % (ref 39.0–52.0)
Hemoglobin: 13.3 g/dL (ref 13.0–17.0)
Immature Granulocytes: 1 %
Lymphocytes Relative: 21 %
Lymphs Abs: 1 10*3/uL (ref 0.7–4.0)
MCH: 26.5 pg (ref 26.0–34.0)
MCHC: 30.7 g/dL (ref 30.0–36.0)
MCV: 86.3 fL (ref 80.0–100.0)
Monocytes Absolute: 0.5 10*3/uL (ref 0.1–1.0)
Monocytes Relative: 11 %
Neutro Abs: 2.5 10*3/uL (ref 1.7–7.7)
Neutrophils Relative %: 52 %
Platelets: 276 10*3/uL (ref 150–400)
RBC: 5.02 MIL/uL (ref 4.22–5.81)
RDW: 14.2 % (ref 11.5–15.5)
WBC: 4.8 10*3/uL (ref 4.0–10.5)
nRBC: 0 % (ref 0.0–0.2)

## 2022-07-17 LAB — BASIC METABOLIC PANEL
Anion gap: 11 (ref 5–15)
BUN: 10 mg/dL (ref 8–23)
CO2: 20 mmol/L — ABNORMAL LOW (ref 22–32)
Calcium: 8.9 mg/dL (ref 8.9–10.3)
Chloride: 105 mmol/L (ref 98–111)
Creatinine, Ser: 0.81 mg/dL (ref 0.61–1.24)
GFR, Estimated: >60 mL/min (ref >60)
Glucose, Bld: 135 mg/dL — ABNORMAL HIGH (ref 70–99)
Potassium: 4.3 mmol/L (ref 3.5–5.1)
Sodium: 136 mmol/L (ref 135–145)

## 2022-07-17 LAB — RESP PANEL BY RT-PCR (FLU A&B, COVID) ARPGX2
Influenza A by PCR: NEGATIVE
Influenza B by PCR: NEGATIVE
SARS Coronavirus 2 by RT PCR: NEGATIVE

## 2022-07-17 LAB — TROPONIN I (HIGH SENSITIVITY)
Troponin I (High Sensitivity): 4 ng/L (ref <18)
Troponin I (High Sensitivity): 4 ng/L (ref <18)

## 2022-07-17 MED ORDER — ONDANSETRON HCL 4 MG/2ML IJ SOLN: 4.0000 mg | Freq: Four times a day (QID) | INTRAMUSCULAR | Status: DC | PRN

## 2022-07-17 MED ORDER — MONTELUKAST SODIUM 10 MG PO TABS
10.0000 mg | ORAL_TABLET | Freq: Every day | ORAL | Status: DC
  Administered 2022-07-18 – 2022-07-20 (×4): 10 mg via ORAL
  Filled 2022-07-17 (×4): qty 1

## 2022-07-17 MED ORDER — LORATADINE 10 MG PO TABS: 10.0000 mg | ORAL_TABLET | Freq: Every evening | ORAL | Status: DC

## 2022-07-17 MED ORDER — ALBUTEROL SULFATE (2.5 MG/3ML) 0.083% IN NEBU
2.5000 mg | INHALATION_SOLUTION | Freq: Once | RESPIRATORY_TRACT | Status: AC
  Administered 2022-07-17: 2.5 mg via RESPIRATORY_TRACT
  Filled 2022-07-17: qty 3

## 2022-07-17 MED ORDER — EMPAGLIFLOZIN-METFORMIN HCL 12.5-1000 MG PO TABS: 1.0000 | ORAL_TABLET | Freq: Two times a day (BID) | ORAL | Status: DC

## 2022-07-17 MED ORDER — OFLOXACIN 0.3 % OP SOLN: 1.0000 [drp] | Freq: Four times a day (QID) | OPHTHALMIC | Status: DC

## 2022-07-17 MED ORDER — ONDANSETRON HCL 4 MG PO TABS: 4.0000 mg | ORAL_TABLET | Freq: Four times a day (QID) | ORAL | Status: DC | PRN

## 2022-07-17 MED ORDER — ALBUTEROL SULFATE (2.5 MG/3ML) 0.083% IN NEBU
INHALATION_SOLUTION | RESPIRATORY_TRACT | Status: AC
  Filled 2022-07-17: qty 12

## 2022-07-17 MED ORDER — MAGNESIUM SULFATE 2 GM/50ML IV SOLN
2.0000 g | Freq: Once | INTRAVENOUS | Status: AC
  Administered 2022-07-17: 2 g via INTRAVENOUS
  Filled 2022-07-17: qty 50

## 2022-07-17 MED ORDER — INSULIN ASPART 100 UNIT/ML IJ SOLN
0.0000 [IU] | Freq: Three times a day (TID) | INTRAMUSCULAR | Status: DC
  Administered 2022-07-18: 5 [IU] via SUBCUTANEOUS
  Administered 2022-07-18: 3 [IU] via SUBCUTANEOUS
  Administered 2022-07-18: 8 [IU] via SUBCUTANEOUS
  Administered 2022-07-19 (×2): 3 [IU] via SUBCUTANEOUS
  Administered 2022-07-19: 8 [IU] via SUBCUTANEOUS
  Administered 2022-07-20: 11 [IU] via SUBCUTANEOUS
  Administered 2022-07-20: 2 [IU] via SUBCUTANEOUS
  Administered 2022-07-20: 3 [IU] via SUBCUTANEOUS
  Administered 2022-07-21: 5 [IU] via SUBCUTANEOUS
  Administered 2022-07-21: 2 [IU] via SUBCUTANEOUS

## 2022-07-17 MED ORDER — AZITHROMYCIN 250 MG PO TABS
500.0000 mg | ORAL_TABLET | Freq: Once | ORAL | Status: AC
  Administered 2022-07-18: 500 mg via ORAL
  Filled 2022-07-17: qty 2

## 2022-07-17 MED ORDER — PENTOXIFYLLINE ER 400 MG PO TBCR: 400.0000 mg | EXTENDED_RELEASE_TABLET | Freq: Three times a day (TID) | ORAL | Status: DC

## 2022-07-17 MED ORDER — METFORMIN HCL 500 MG PO TABS
1000.0000 mg | ORAL_TABLET | Freq: Two times a day (BID) | ORAL | Status: DC
  Administered 2022-07-18 – 2022-07-21 (×7): 1000 mg via ORAL
  Filled 2022-07-17 (×8): qty 2

## 2022-07-17 MED ORDER — IPRATROPIUM-ALBUTEROL 0.5-2.5 (3) MG/3ML IN SOLN
3.0000 mL | Freq: Three times a day (TID) | RESPIRATORY_TRACT | Status: DC
  Administered 2022-07-18 – 2022-07-19 (×6): 3 mL via RESPIRATORY_TRACT
  Filled 2022-07-17 (×7): qty 3

## 2022-07-17 MED ORDER — METHYLPREDNISOLONE SODIUM SUCC 125 MG IJ SOLR
125.0000 mg | Freq: Once | INTRAMUSCULAR | Status: AC
  Administered 2022-07-17: 125 mg via INTRAVENOUS
  Filled 2022-07-17: qty 2

## 2022-07-17 MED ORDER — INSULIN ASPART 100 UNIT/ML IJ SOLN
0.0000 [IU] | Freq: Every day | INTRAMUSCULAR | Status: DC
  Administered 2022-07-18: 3 [IU] via SUBCUTANEOUS
  Administered 2022-07-19: 2 [IU] via SUBCUTANEOUS

## 2022-07-17 MED ORDER — EMPAGLIFLOZIN 10 MG PO TABS
10.0000 mg | ORAL_TABLET | Freq: Two times a day (BID) | ORAL | Status: DC
  Administered 2022-07-18 – 2022-07-21 (×7): 10 mg via ORAL
  Filled 2022-07-17 (×10): qty 1

## 2022-07-17 MED ORDER — ACETAMINOPHEN 650 MG RE SUPP: 650.0000 mg | Freq: Four times a day (QID) | RECTAL | Status: DC | PRN

## 2022-07-17 MED ORDER — ENOXAPARIN SODIUM 40 MG/0.4ML IJ SOSY
40.0000 mg | PREFILLED_SYRINGE | INTRAMUSCULAR | Status: DC
  Administered 2022-07-18 – 2022-07-21 (×4): 40 mg via SUBCUTANEOUS
  Filled 2022-07-17 (×4): qty 0.4

## 2022-07-17 MED ORDER — PREDNISONE 20 MG PO TABS
40.0000 mg | ORAL_TABLET | Freq: Every day | ORAL | Status: DC
  Administered 2022-07-18 – 2022-07-20 (×3): 40 mg via ORAL
  Filled 2022-07-17 (×4): qty 2

## 2022-07-17 MED ORDER — ALBUTEROL SULFATE (2.5 MG/3ML) 0.083% IN NEBU
10.0000 mg/h | INHALATION_SOLUTION | Freq: Once | RESPIRATORY_TRACT | Status: AC
  Administered 2022-07-17: 10 mg/h via RESPIRATORY_TRACT
  Filled 2022-07-17: qty 3

## 2022-07-17 MED ORDER — ACETAMINOPHEN 325 MG PO TABS
650.0000 mg | ORAL_TABLET | Freq: Four times a day (QID) | ORAL | Status: DC | PRN
  Administered 2022-07-18 – 2022-07-19 (×2): 650 mg via ORAL
  Filled 2022-07-17 (×2): qty 2

## 2022-07-17 MED ORDER — IPRATROPIUM BROMIDE 0.02 % IN SOLN
0.5000 mg | Freq: Once | RESPIRATORY_TRACT | Status: AC
  Administered 2022-07-17: 0.5 mg via RESPIRATORY_TRACT
  Filled 2022-07-17: qty 2.5

## 2022-07-17 MED ORDER — SODIUM CHLORIDE 0.9 % IV SOLN: INTRAVENOUS | Status: DC

## 2022-07-17 MED ORDER — IPRATROPIUM-ALBUTEROL 0.5-2.5 (3) MG/3ML IN SOLN
RESPIRATORY_TRACT | Status: AC
  Administered 2022-07-17: 3 mL
  Filled 2022-07-17: qty 3

## 2022-07-17 MED ORDER — LATANOPROST 0.005 % OP SOLN
1.0000 [drp] | Freq: Every day | OPHTHALMIC | Status: DC
  Administered 2022-07-18 – 2022-07-20 (×3): 1 [drp] via OPHTHALMIC
  Filled 2022-07-17 (×2): qty 2.5

## 2022-07-17 MED ORDER — ATORVASTATIN CALCIUM 40 MG PO TABS
40.0000 mg | ORAL_TABLET | Freq: Every day | ORAL | Status: DC
  Administered 2022-07-18 – 2022-07-21 (×4): 40 mg via ORAL
  Filled 2022-07-17 (×4): qty 1

## 2022-07-17 MED ORDER — AZITHROMYCIN 250 MG PO TABS
250.0000 mg | ORAL_TABLET | Freq: Every day | ORAL | Status: AC
  Administered 2022-07-18 – 2022-07-21 (×4): 250 mg via ORAL
  Filled 2022-07-17 (×4): qty 1

## 2022-07-17 MED ORDER — SODIUM CHLORIDE 0.9 % IV BOLUS
1000.0000 mL | Freq: Once | INTRAVENOUS | Status: AC
  Administered 2022-07-17: 1000 mL via INTRAVENOUS

## 2022-07-17 MED ORDER — UMECLIDINIUM BROMIDE 62.5 MCG/ACT IN AEPB
1.0000 | INHALATION_SPRAY | Freq: Every day | RESPIRATORY_TRACT | Status: DC
  Administered 2022-07-18 – 2022-07-21 (×4): 1 via RESPIRATORY_TRACT
  Filled 2022-07-17: qty 7

## 2022-07-17 MED ORDER — MOMETASONE FURO-FORMOTEROL FUM 200-5 MCG/ACT IN AERO
2.0000 | INHALATION_SPRAY | Freq: Two times a day (BID) | RESPIRATORY_TRACT | Status: DC
  Administered 2022-07-18 – 2022-07-21 (×7): 2 via RESPIRATORY_TRACT
  Filled 2022-07-17: qty 8.8

## 2022-07-17 MED ORDER — SENNOSIDES-DOCUSATE SODIUM 8.6-50 MG PO TABS: 1.0000 | ORAL_TABLET | Freq: Every evening | ORAL | Status: DC | PRN

## 2022-07-17 MED ORDER — MORPHINE SULFATE (PF) 4 MG/ML IV SOLN
4.0000 mg | Freq: Once | INTRAVENOUS | Status: AC
  Administered 2022-07-17: 4 mg via INTRAVENOUS
  Filled 2022-07-17: qty 1

## 2022-07-17 MED ORDER — ASPIRIN 81 MG PO TBEC
81.0000 mg | DELAYED_RELEASE_TABLET | Freq: Every day | ORAL | Status: DC
  Administered 2022-07-18 – 2022-07-21 (×4): 81 mg via ORAL
  Filled 2022-07-17 (×4): qty 1

## 2022-07-17 MED ORDER — PREGABALIN 100 MG PO CAPS
100.0000 mg | ORAL_CAPSULE | Freq: Three times a day (TID) | ORAL | Status: DC
  Administered 2022-07-18 – 2022-07-21 (×11): 100 mg via ORAL
  Filled 2022-07-17 (×11): qty 1

## 2022-07-17 NOTE — Hospital Course (Addendum)
   Endorse increased cough with associated with chest tightness for 1 week. Denies any sputum production. States he bought furniture and had to clean/vacuum his room Saturday. The furniture was brought in today and he started having more SOB. He tried his nebulizer without improvements. No fevers, chills  Received Covid booster last week.   T2DM, neuropathy, asthma/COPD overlap   SH: Lives alone in [place]. Independent with ADL and iADLs, Daughter checks on her and a friend takes him to his appts. Denies any ETOH use. Quit cig 10 years ago after smoking 1 ppd from about 13 to 51.   Allergies: Shellfish      Worse exacerbation than previously.   Pain in chest, frequent coughing could not catch his breath, wheezing non stop. Talk and get tired.   10/12: Pt had chest pain over night and could not speak, had a breathing treatment (2-3) times and was fine. Still having chest pain. Robitussin did not help at all. He has pain when he takes deep breaths, likely muscular. Will get patient to walk in the halls. Pt does not take dulera in the hospital, but is reported at home. Inspiratory audible wheezing, still wheezing on physical exam.

## 2022-07-17 NOTE — ED Provider Triage Note (Signed)
Emergency Medicine Provider Triage Evaluation Note  Korion Cuevas , a 61 y.o. male  was evaluated in triage.  Pt complains of shortness of breath and chest pain.  History of asthma.  No CHF.  Denies lower extremity edema.  Patient tried albuterol nebulizer treatment prior to arrival with no improvement..  Review of Systems  Positive: SOB Negative: fever  Physical Exam  BP 132/83 (BP Location: Right Arm)   Pulse (!) 108   Temp 98.3 F (36.8 C) (Oral)   Resp (!) 26   SpO2 98%  Gen:   Awake, no distress   Resp:  Normal effort  MSK:   Moves extremities without difficulty  Other:  Wheeze heard throughout.   Medical Decision Making  Medically screening exam initiated at 6:17 PM.  Appropriate orders placed.  Maxson Oddo was informed that the remainder of the evaluation will be completed by another provider, this initial triage assessment does not replace that evaluation, and the importance of remaining in the ED until their evaluation is complete.  Duoneb given in triage Labs CXR   Suzy Bouchard, [place] 07/17/22 0867
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
## 2022-07-17 NOTE — ED Triage Notes (Signed)
Complains of difficulty breathing wheeazing and chest pain.  Tried rescue inhaler and neb tx at home with no success.  Patient has audible wheezing.  Also complains of chest pain that is worse than his normal asthma attacks.

## 2022-07-17 NOTE — ED Provider Notes (Addendum)
[facility] EMERGENCY DEPARTMENT Provider Note   CSN: [id number] Arrival date & time: 07/17/22  1806     History  Chief Complaint  Patient presents with   Asthma   Chest Pain    Richard [name] is a 61 y.o. male.   Asthma Associated symptoms include chest pain.  Chest Pain    Patient has a history of diabetes, hypertension asthma hyperlipidemia COPD who presents ED with complaints of shortness of breath.  Patient states he has been having symptoms for few days but today became severe.  He does have history of smoking but quit few years ago.  He has not had any fevers or chills.  He does have some discomfort in his chest that he attributes to the breathing.  No vomiting or diarrhea  Home Medications Prior to Admission medications   Medication Sig Start Date End Date Taking? Authorizing Provider  Accu-Chek FastClix Lancets MISC Check blood sugar two times a day 09/07/20   [name] C, DO  albuterol (PROVENTIL) (2.5 MG/3ML) 0.083% nebulizer solution Take 3 mLs (2.5 mg total) by nebulization every 6 (six) hours as needed for wheezing or shortness of breath. 06/05/22   Schutt, Grafton Folk, PA-C  albuterol (VENTOLIN HFA) 108 (90 Base) MCG/ACT inhaler Inhale 1-2 puffs into the lungs every 4 (four) hours as needed for wheezing or shortness of breath. INHALE 2 PUFFS INTO THE LUNGS EVERY 4 HOURS AS NEEDED FOR WHEEZING OR SHORTNESS OF BREATH 06/14/22   [name], DO  ascorbic acid (VITAMIN C) 500 MG tablet Take 500 mg by mouth daily.    [provider]  aspirin EC 81 MG EC tablet Take 1 tablet (81 mg total) by mouth daily. 09/26/15   Burns, Arloa Koh, MD  atorvastatin (LIPITOR) 40 MG tablet Take 1 tablet (40 mg total) by mouth daily. 08/17/21   [name], DO  Blood Glucose Monitoring Suppl (ACCU-CHEK GUIDE) w/Device KIT USE TO CHECK BLOOD SUGAR TWICE DAILY 08/16/21   [name], DO  diclofenac sodium (VOLTAREN) 1 % GEL Apply 4 g topically 4 (four)  times daily as needed. Patient taking differently: Apply 4 g topically daily. 03/26/19   [name], DO  Dulaglutide (TRULICITY) 3 DD/2.2GU SOPN Inject 3 mg as directed once a week. 12/14/21   [name], DO  Empagliflozin-metFORMIN HCl (SYNJARDY) 12.02-999 MG TABS Take 1 tablet by mouth 2 (two) times daily. 03/08/22   [name], DO  glucose blood (ACCU-CHEK GUIDE) test strip USE TO CHECK BLOOD SUGARS TWICE DAILY 08/16/21   [name], DO  ibuprofen (ADVIL) 200 MG tablet Take 200 mg by mouth every 6 (six) hours as needed for mild pain.    [provider]  ibuprofen (ADVIL,MOTRIN) 600 MG tablet Take 1 tablet (600 mg total) by mouth every 6 (six) hours as needed. 11/25/18   Julianne Rice, MD  ketorolac (ACULAR) 0.4 % SOLN Place 1 drop into the left eye 4 (four) times daily. 09/18/21   [provider]  Lancets Misc. Patricia Pesa 2 NORMAL) MISC Check blood sugar two times a day 06/02/18   [provider]  latanoprost (XALATAN) 0.005 % ophthalmic solution Place 1 drop into both eyes at bedtime.  05/05/20   [provider]  levocetirizine (XYZAL) 5 MG tablet Take 1 tablet (5 mg total) by mouth every evening. 03/08/22   [name], DO  mometasone-formoterol (DULERA) 200-5 MCG/ACT AERO Inhale 2 puffs into the lungs 2 (two)  times daily. 03/08/22   [name], DO  montelukast (SINGULAIR) 10 MG tablet Take 1 tablet (10 mg total) by mouth at bedtime. 03/08/22   [name], DO  mupirocin ointment (BACTROBAN) 2 % Apply 1 application topically 2 (two) times daily. 05/29/21   Bronson Ing, DPM  nitroGLYCERIN (NITRODUR - DOSED IN MG/24 HR) 0.2 mg/hr patch Place 1 patch (0.2 mg total) onto the skin daily. 09/28/20   Persons, Bevely Palmer, PA  ofloxacin (OCUFLOX) 0.3 % ophthalmic solution Place 1 drop into the left eye 4 (four) times daily. 09/18/21   [provider]  pentoxifylline (TRENTAL) 400 MG CR tablet Take 1 tablet (400 mg total) by mouth 3  (three) times daily with meals. 09/28/20   Persons, Bevely Palmer, PA  pioglitazone (ACTOS) 30 MG tablet Take 1 tablet (30 mg total) by mouth daily. 06/14/22   [name], DO  prednisoLONE acetate (PRED FORTE) 1 % ophthalmic suspension Place 1 drop into the left eye 4 (four) times daily. 09/19/21   [provider]  pregabalin (LYRICA) 100 MG capsule Take 1 capsule (100 mg total) by mouth 3 (three) times daily. 03/08/22   [name], DO  silver sulfADIAZINE (SILVADENE) 1 % cream Apply pea-sized amount to wound daily. 01/25/22   Landis Martins, DPM  triamcinolone ointment (KENALOG) 0.1 % Apply 1 application topically 2 (two) times daily as needed. 01/01/19   [name], DO      Allergies    Shellfish allergy    Review of Systems   Review of Systems  Cardiovascular:  Positive for chest pain.    Physical Exam Updated Vital Signs BP (!) 157/80   Pulse (!) 109   Temp 98.3 F (36.8 C) (Oral)   Resp (!) 22   Ht 1.905 m ($Remove'6\' 3"'[id number]$ )   Wt 97.5 kg   SpO2 100%   BMI 26.87 kg/m  Physical Exam Vitals and nursing note reviewed.  Constitutional:      Appearance: He is well-developed. He is ill-appearing.  HENT:     Head: Normocephalic and atraumatic.     Right Ear: External ear normal.     Left Ear: External ear normal.  Eyes:     General: No scleral icterus.       Right eye: No discharge.        Left eye: No discharge.     Conjunctiva/sclera: Conjunctivae normal.  Neck:     Trachea: No tracheal deviation.  Cardiovascular:     Rate and Rhythm: Normal rate and regular rhythm.  Pulmonary:     Effort: Tachypnea and prolonged expiration present. No respiratory distress.     Breath sounds: No stridor. Wheezing present. No rales.  Abdominal:     General: Bowel sounds are normal. There is no distension.     Palpations: Abdomen is soft.     Tenderness: There is no abdominal tenderness. There is no guarding or rebound.  Musculoskeletal:        General: No tenderness or  deformity.     Cervical back: Neck supple.  Skin:    General: Skin is warm and dry.     Findings: No rash.  Neurological:     General: No focal deficit present.     Mental Status: He is alert.     Cranial Nerves: No cranial nerve deficit (no facial droop, extraocular movements intact, no slurred speech).     Sensory: No sensory deficit.     Motor: No  abnormal muscle tone or seizure activity.     Coordination: Coordination normal.  Psychiatric:        Mood and Affect: Mood normal.     ED Results / Procedures / Treatments   Labs (all labs ordered are listed, but only abnormal results are displayed) Labs Reviewed  CBC WITH DIFFERENTIAL/PLATELET - Abnormal; Notable for the following components:      Result Value   Eosinophils Absolute 0.6 (*)    All other components within normal limits  BASIC METABOLIC PANEL - Abnormal; Notable for the following components:   CO2 20 (*)    Glucose, Bld 135 (*)    All other components within normal limits  RESP PANEL BY RT-PCR (FLU A&B, COVID) ARPGX2  TROPONIN I (HIGH SENSITIVITY)  TROPONIN I (HIGH SENSITIVITY)    EKG EKG Interpretation  Date/Time:  Tuesday July 17 2022 20:19:51 EDT Ventricular Rate:  101 PR Interval:  186 QRS Duration: 86 QT Interval:  335 QTC Calculation: 435 R Axis:   42 Text Interpretation: Sinus tachycardia Biatrial enlargement Abnormal R-wave progression, early transition Minimal ST elevation, inferior leads No significant change since last tracing Confirmed by Dorie Rank [phone]) on 07/17/2022 9:37:14 PM  Radiology DG Chest Portable 1 View  Result Date: 07/17/2022 CLINICAL DATA:  Shortness of breath. Wheezing. Chest pain. EXAM: PORTABLE CHEST 1 VIEW COMPARISON:  Radiograph 06/10/2022. Chest CT 07/19/2020 FINDINGS: The cardiomediastinal contours are normal. Emphysematous changes on prior CT are not well demonstrated by radiograph. There is central bronchial thickening. Pulmonary vasculature is normal. No  consolidation, pleural effusion, or pneumothorax. No acute osseous abnormalities are seen. IMPRESSION: 1. Central bronchial thickening. 2. No focal airspace disease. Electronically Signed   By: Keith Rake M.D.   On: 07/17/2022 19:09    Procedures .Critical Care  Performed by: Dorie Rank, MD Authorized by: Dorie Rank, MD   Critical care provider statement:    Critical care time (minutes):  45   Critical care was time spent personally by me on the following activities:  Development of treatment plan with patient or surrogate, discussions with consultants, evaluation of patient's response to treatment, examination of patient, ordering and review of laboratory studies, ordering and review of radiographic studies, ordering and performing treatments and interventions, pulse oximetry, re-evaluation of patient's condition and review of old charts     Medications Ordered in ED Medications  sodium chloride 0.9 % bolus 1,000 mL (0 mLs Intravenous Stopped 07/17/22 2040)    And  0.9 %  sodium chloride infusion ( Intravenous New Bag/Given 07/17/22 1935)  albuterol (PROVENTIL) (2.5 MG/3ML) 0.083% nebulizer solution (has no administration in time range)  albuterol (PROVENTIL) (2.5 MG/3ML) 0.083% nebulizer solution 2.5 mg (has no administration in time range)  ipratropium-albuterol (DUONEB) 0.5-2.5 (3) MG/3ML nebulizer solution (3 mLs  Given 07/17/22 1818)  albuterol (PROVENTIL) (2.5 MG/3ML) 0.083% nebulizer solution (10 mg/hr Nebulization Given 07/17/22 2020)  ipratropium (ATROVENT) nebulizer solution 0.5 mg (0.5 mg Nebulization Given 07/17/22 2021)  methylPREDNISolone sodium succinate (SOLU-MEDROL) 125 mg/2 mL injection 125 mg (125 mg Intravenous Given 07/17/22 1928)  magnesium sulfate IVPB 2 g 50 mL (0 g Intravenous Stopped 07/17/22 2040)  morphine (PF) 4 MG/ML injection 4 mg (4 mg Intravenous Given 07/17/22 2045)    ED Course/ Medical Decision Making/ A&P Clinical Course as of 07/17/22 2145   Tue Jul 17, 2022  2134 CBC with Differential(!) [JK]  2135 Normal [JK]  2951 Basic metabolic panel(!) Normal [JK]  2135 Troponin I (High Sensitivity) Normal [JK]  2140 Pt still with significant wheezing, increased work of breathing despite treatment.  Additional treatment ordered [JK]    Clinical Course User Index [JK] Dorie Rank, MD                           Medical Decision Making Frontal diagnosis includes but not limited to acute coronary syndrome, COPD exacerbation, pulmonary embolism, pneumothorax, pneumonia  Problems Addressed: COPD exacerbation ([place]): acute illness or injury that poses a threat to life or bodily functions  Amount and/or Complexity of Data Reviewed Labs: ordered. Decision-making details documented in ED Course. Radiology: ordered. Discussion of management or test interpretation with external provider(s): Case discussed with internal medicine teaching service regarding admission  Risk Prescription drug management. Drug therapy requiring intensive monitoring for toxicity. Decision regarding hospitalization.   Patient noted to have significant wheezing on exam.  Patient was treated with albuterol nebulizer treatments, steroids, magnesium.  Unfortunately he continued to have significant increased work of breathing and wheezing despite treatments.  Laboratory test does not show any evidence of cardiac ischemia.  Chest x-ray without pneumonia or pneumothorax.  Presentation consistent with COPD asthma exacerbation.  With his persistent increase of work of breathing we will plan on admission to the hospital for further treatment.    Final Clinical Impression(s) / ED Diagnoses Final diagnoses:  COPD exacerbation [facility])    Rx / DC Orders ED Discharge Orders     None           Dorie Rank, MD 07/17/22 2152

## 2022-07-18 LAB — LIPID PANEL
Cholesterol: 225 mg/dL — ABNORMAL HIGH (ref 0–200)
HDL: 74 mg/dL (ref >40)
LDL Cholesterol: 148 mg/dL — ABNORMAL HIGH (ref 0–99)
Total CHOL/HDL Ratio: 3 RATIO
Triglycerides: 14 mg/dL (ref <150)
VLDL: 3 mg/dL (ref 0–40)

## 2022-07-18 LAB — CBC
HCT: 39.6 % (ref 39.0–52.0)
Hemoglobin: 13 g/dL (ref 13.0–17.0)
MCH: 27.1 pg (ref 26.0–34.0)
MCHC: 32.8 g/dL (ref 30.0–36.0)
MCV: 82.7 fL (ref 80.0–100.0)
Platelets: 255 10*3/uL (ref 150–400)
RBC: 4.79 MIL/uL (ref 4.22–5.81)
RDW: 14.3 % (ref 11.5–15.5)
WBC: 6.3 10*3/uL (ref 4.0–10.5)
nRBC: 0 % (ref 0.0–0.2)

## 2022-07-18 LAB — BASIC METABOLIC PANEL
Anion gap: 10 (ref 5–15)
Anion gap: 9 (ref 5–15)
BUN: 13 mg/dL (ref 8–23)
BUN: 15 mg/dL (ref 8–23)
CO2: 20 mmol/L — ABNORMAL LOW (ref 22–32)
CO2: 19 mmol/L — ABNORMAL LOW (ref 22–32)
Calcium: 8.8 mg/dL — ABNORMAL LOW (ref 8.9–10.3)
Calcium: 8.6 mg/dL — ABNORMAL LOW (ref 8.9–10.3)
Chloride: 103 mmol/L (ref 98–111)
Chloride: 106 mmol/L (ref 98–111)
Creatinine, Ser: 0.98 mg/dL (ref 0.61–1.24)
Creatinine, Ser: 1.02 mg/dL (ref 0.61–1.24)
GFR, Estimated: >60 mL/min (ref >60)
GFR, Estimated: >60 mL/min (ref >60)
Glucose, Bld: 294 mg/dL — ABNORMAL HIGH (ref 70–99)
Glucose, Bld: 188 mg/dL — ABNORMAL HIGH (ref 70–99)
Potassium: 5.2 mmol/L — ABNORMAL HIGH (ref 3.5–5.1)
Potassium: 4.9 mmol/L (ref 3.5–5.1)
Sodium: 133 mmol/L — ABNORMAL LOW (ref 135–145)
Sodium: 134 mmol/L — ABNORMAL LOW (ref 135–145)

## 2022-07-18 LAB — CBG MONITORING, ED
Glucose-Capillary: 179 mg/dL — ABNORMAL HIGH (ref 70–99)
Glucose-Capillary: 221 mg/dL — ABNORMAL HIGH (ref 70–99)
Glucose-Capillary: 255 mg/dL — ABNORMAL HIGH (ref 70–99)
Glucose-Capillary: 289 mg/dL — ABNORMAL HIGH (ref 70–99)

## 2022-07-18 LAB — GLUCOSE, CAPILLARY: Glucose-Capillary: 153 mg/dL — ABNORMAL HIGH (ref 70–99)

## 2022-07-18 MED ORDER — HYDROCOD POLI-CHLORPHE POLI ER 10-8 MG/5ML PO SUER
5.0000 mL | Freq: Two times a day (BID) | ORAL | Status: DC
  Administered 2022-07-18: 5 mL via ORAL
  Filled 2022-07-18: qty 5

## 2022-07-18 MED ORDER — HYDROCODONE BIT-HOMATROP MBR 5-1.5 MG/5ML PO SOLN
5.0000 mL | Freq: Four times a day (QID) | ORAL | Status: DC
  Administered 2022-07-18 – 2022-07-19 (×4): 5 mL via ORAL
  Filled 2022-07-18 (×5): qty 5

## 2022-07-18 MED ORDER — BENZONATATE 100 MG PO CAPS
200.0000 mg | ORAL_CAPSULE | Freq: Three times a day (TID) | ORAL | Status: AC
  Administered 2022-07-18 – 2022-07-19 (×6): 200 mg via ORAL
  Filled 2022-07-18 (×6): qty 2

## 2022-07-18 MED ORDER — KETOROLAC TROMETHAMINE 15 MG/ML IJ SOLN
15.0000 mg | Freq: Once | INTRAMUSCULAR | Status: AC
  Administered 2022-07-18: 15 mg via INTRAVENOUS
  Filled 2022-07-18: qty 1

## 2022-07-18 NOTE — ED Notes (Signed)
Pt c/o trouble breathing. O2 stable 96% RA. HR and R elevated. Pt also c/o pain in chest from coughing. MD Gomez-Caraballo paged

## 2022-07-18 NOTE — Care Management Obs Status (Signed)
[place] NOTIFICATION   Patient Details  Name: Richard [name] MRN: [id number] Date of Birth: 11-12-1960   Medicare Observation Status Notification Given:  Ernesta Amble, RN 07/18/2022, 4:43 PM

## 2022-07-19 LAB — IRON AND TIBC
Iron: 77 ug/dL (ref 45–182)
Saturation Ratios: 20 % (ref 17.9–39.5)
TIBC: 392 ug/dL (ref 250–450)
UIBC: 315 ug/dL

## 2022-07-19 LAB — CBC
HCT: 36.7 % — ABNORMAL LOW (ref 39.0–52.0)
Hemoglobin: 12.1 g/dL — ABNORMAL LOW (ref 13.0–17.0)
MCH: 27 pg (ref 26.0–34.0)
MCHC: 33 g/dL (ref 30.0–36.0)
MCV: 81.9 fL (ref 80.0–100.0)
Platelets: 248 10*3/uL (ref 150–400)
RBC: 4.48 MIL/uL (ref 4.22–5.81)
RDW: 14.3 % (ref 11.5–15.5)
WBC: 8.4 10*3/uL (ref 4.0–10.5)
nRBC: 0 % (ref 0.0–0.2)

## 2022-07-19 LAB — FERRITIN: Ferritin: 15 ng/mL — ABNORMAL LOW (ref 24–336)

## 2022-07-19 LAB — GLUCOSE, CAPILLARY
Glucose-Capillary: 155 mg/dL — ABNORMAL HIGH (ref 70–99)
Glucose-Capillary: 179 mg/dL — ABNORMAL HIGH (ref 70–99)
Glucose-Capillary: 281 mg/dL — ABNORMAL HIGH (ref 70–99)
Glucose-Capillary: 203 mg/dL — ABNORMAL HIGH (ref 70–99)

## 2022-07-19 LAB — BASIC METABOLIC PANEL
Anion gap: 7 (ref 5–15)
BUN: 22 mg/dL (ref 8–23)
CO2: 23 mmol/L (ref 22–32)
Calcium: 8.6 mg/dL — ABNORMAL LOW (ref 8.9–10.3)
Chloride: 105 mmol/L (ref 98–111)
Creatinine, Ser: 1.19 mg/dL (ref 0.61–1.24)
GFR, Estimated: >60 mL/min (ref >60)
Glucose, Bld: 142 mg/dL — ABNORMAL HIGH (ref 70–99)
Potassium: 4.4 mmol/L (ref 3.5–5.1)
Sodium: 135 mmol/L (ref 135–145)

## 2022-07-19 LAB — HIV ANTIBODY (ROUTINE TESTING W REFLEX): HIV Screen 4th Generation wRfx: NONREACTIVE

## 2022-07-19 MED ORDER — LIDOCAINE 5 % EX PTCH
1.0000 | MEDICATED_PATCH | CUTANEOUS | Status: DC
  Administered 2022-07-19 – 2022-07-21 (×3): 1 via TRANSDERMAL
  Filled 2022-07-19 (×3): qty 1

## 2022-07-19 MED ORDER — GUAIFENESIN-DM 100-10 MG/5ML PO SYRP
5.0000 mL | ORAL_SOLUTION | ORAL | Status: DC | PRN
  Administered 2022-07-19 (×2): 5 mL via ORAL
  Filled 2022-07-19 (×2): qty 5

## 2022-07-19 MED ORDER — IPRATROPIUM-ALBUTEROL 0.5-2.5 (3) MG/3ML IN SOLN
3.0000 mL | Freq: Four times a day (QID) | RESPIRATORY_TRACT | Status: DC
  Administered 2022-07-19 – 2022-07-21 (×7): 3 mL via RESPIRATORY_TRACT
  Filled 2022-07-19 (×7): qty 3

## 2022-07-19 MED ORDER — ACETAMINOPHEN 325 MG PO TABS
650.0000 mg | ORAL_TABLET | Freq: Four times a day (QID) | ORAL | Status: DC
  Administered 2022-07-19 – 2022-07-21 (×7): 650 mg via ORAL
  Filled 2022-07-19 (×7): qty 2

## 2022-07-19 MED ORDER — IPRATROPIUM-ALBUTEROL 0.5-2.5 (3) MG/3ML IN SOLN: 3.0000 mL | RESPIRATORY_TRACT | Status: DC | PRN

## 2022-07-19 MED ORDER — IPRATROPIUM-ALBUTEROL 0.5-2.5 (3) MG/3ML IN SOLN
3.0000 mL | RESPIRATORY_TRACT | Status: DC | PRN
  Administered 2022-07-19 (×2): 3 mL via RESPIRATORY_TRACT
  Filled 2022-07-19: qty 3

## 2022-07-19 MED ORDER — IPRATROPIUM-ALBUTEROL 0.5-2.5 (3) MG/3ML IN SOLN: 3.0000 mL | RESPIRATORY_TRACT | Status: DC

## 2022-07-19 MED ORDER — ALBUTEROL SULFATE (2.5 MG/3ML) 0.083% IN NEBU
2.5000 mg | INHALATION_SOLUTION | RESPIRATORY_TRACT | Status: DC | PRN
  Administered 2022-07-20 – 2022-07-21 (×2): 2.5 mg via RESPIRATORY_TRACT
  Filled 2022-07-19: qty 3

## 2022-07-19 NOTE — Progress Notes (Signed)
Arrived at patient's room with the patient on the side of the bed, wheezing and unable to breathe. Tried giving prn inhaler but patient was unable to inhale the medication. Charge RN called the respiratory therapist and grabbed the nebulizer treatment. Nebulizer was given to the patient and instantly felt relief. After the treatment, patient started coughing and wheezing again and had to give another breathing treatment. After the last breathing treatment patient felt better and has stopped wheezing. MD on call was paged and gave her an update.

## 2022-07-19 NOTE — Progress Notes (Signed)
RT called to patient's bedside for an asthma attack.  RT asked RN to start a nebulized treatment before I arrived.  Upon entering patient's room, he had finished a Duoneb treatment and was on a NRB mask stats 100%.  Patient has exp. wheezes, but is able to speak to [place]. As patient's breathing settled down, NRB mask removed. On room air, sats 98%.  Patient states he feel better but his asthma attacks following a cough fit.  RT will change Douneb to scheduled Q6, with PRNs available to get his asthma better controlled.  Patient used the Peak flow device and reach 170 followed by additional coughing.  For patient's height and age, normal peak flow would be 460 - 590. We discussed using the peak flow daily to gage when is asthma was starting to flair to better control it.  RT will continue to monitor.

## 2022-07-19 NOTE — Plan of Care (Signed)

## 2022-07-19 NOTE — Progress Notes (Addendum)
   Subjective:  Patient feeling better than yesterday but did have an acute dyspneic exacerbation early this morning. Patient states that he was scared and called for the nurse. He felt much better after Duoneb. Patient also expressed displeasure with having to continually take deep breaths during physical examination when it always exacerbates his coughing/pain afterwards. Patient also states he has a past history with substance abuse and would like to discontinue any narcotic medications unless absolutely necessary.  Objective:  Vital signs in last 24 hours: Vitals:   07/19/22 0442 07/19/22 0839 07/19/22 0853 07/19/22 1200  BP: 136/81 126/81  124/60  Pulse: (!) 112 83 98 94  Resp: '20 18 20 18  '$ Temp: 97.7 F (36.5 C) 97.9 F (36.6 C)  97.7 F (36.5 C)  TempSrc: Axillary Oral  Oral  SpO2: 95% 98% 93% 96%  Weight:      Height:       Weight change: -0.454 kg  Intake/Output Summary (Last 24 hours) at 07/19/2022 1324 Last data filed at 07/19/2022 1244 Gross per 24 hour  Intake 1131 ml  Output --  Net 1131 ml   Physical Exam: General: sitting up in bed in no acute distress Head: normocephalic, atraumatic Cardiovascular: regular rate and rhythm without murmurs, rubs, or gallops, no LEE Respiratory: mildly increased work of breathing, diffuse wheezing bilaterally but improved Abdominal: no tenderness to palpation Skin: warm, dry, well-perfused Neurological: alert and oriented x 3 Psychological: Mildly anxious   Assessment/Plan:  Principal Problem:   COPD exacerbation (HCC)  Asthma/COPD exacerbation with chest pain Vitals stable and saturating around 98% on 1 L. Patient's dyspnea has improved but continues to have paroxysmal coughing episodes with associated increase in dyspnea. Patient required additional Duoneb treatment this morning around 6:30 am. Patient given Solumedrol in the ED on admission and is on day 2 of 4 Prednisone 40  mg (for 5 doses total). Patient on day 2 of  5 Azithromycin.  Patient's continues to have pleuritic chest pain which he attributes to his recent COPD exacerbations. Patient has been taking Tessalon. Hycodan added yesterday but did not provide relief for the patient. Given this and patient's desire to avoid narcotics, we will schedule Tylenol 650 q 6, add Lidocaine patch, and continue Robitussin PRN. Patient's telemetry discontinued but CTM pulse oximetry.  -Continue on supplemental O2 and continuous pulse Ox -Target SaO2 betweenn 89-95% -Continue Dueneb, Dulera, Incruse Ellipta, Singulair, Prednisone, and Azithromycin -Continue Tessalon, Tylenol, and Robitussin   2. HLD Non-obstructive CAD PAD Patient's LDL is 148 which is up from 57 last year. Patient states he had not been taking his Lipitor regularly because "I just have so many pills to keep up with." Patient instructed on importance of medication adherence and is amenable. -Continue Lipitor 40 mg daily -ASA 81 mg   3. T2DM Diabetic neuropathy A1c 8.6% on 9/27. CBG currently 155 with a goal around 150. Patient is taking Prednisone. -CTM CBG in setting of steroids -Continue on Metformin, Jardiance, and SSI -Continue Lyrica '100mg'$  TID -Re-start plioglitazone at discharge    4. Aortic Valve Stenosis mild, stage B Followed by [name]. Asymptomatic. -Manage comorbidity: HLD, DM.   5. Substance Use Disorder Patient has a history of substance abuse (most notably cocaine) but has not used any illegal drugs in "many years". Patient wants to avoid any narcotic medication.   Diet: Normal VTE: Enoxaparin Code: Full   LOS: 2 days  [name], Medical Student 07/19/2022, 1:24 PM

## 2022-07-19 NOTE — Progress Notes (Addendum)
   Subjective:  Patient is feeling better but is still very uncomfortable with his dyspnea and chest pain. Patient endorses anxiety surrounding his symptoms.   Objective:  Vital signs in last 24 hours: Vitals:   07/18/22 2156 07/18/22 2218 07/19/22 0432 07/19/22 0442  BP: 134/80   136/81  Pulse: (!) 106   (!) 112  Resp: 20   20  Temp: 98.5 F (36.9 C)   97.7 F (36.5 C)  TempSrc: Oral   Axillary  SpO2:  97% 95% 95%  Weight: 97.1 kg     Height: '6\' 3"'$  (1.905 m)      Weight change: -0.454 kg No intake or output data in the 24 hours ending 07/19/22 0806   Physical Exam: General: lying in bed in mild distress Head: normocephalic, atraumatic Cardiovascular: regular rate and rhythm without murmurs, rubs, or gallops, no LEE Respiratory: increased work of breathing, mild rhonchi and wheezing bilaterally, decreased basilar sounds bilaterally Abdominal: no tenderness to palpation Skin: warm, dry, well-perfused Neurological: alert and oriented x 3 Psychological: Mildly anxious   Assessment/Plan:  Principal Problem:   COPD exacerbation (HCC)   Asthma/COPD exacerbation with chest pain Vitals stable and saturating around 96% on 1 L. Patient's dyspnea has slightly improved but continues to have paroxysmal coughing episodes with associated dyspnea. Patient given Solumedrol in the ED and is on day 1 of 4 Prednisone 40  mg (for 5 doses total). Patient on day 1 of 5 Azithromycin.  Patient's continues to have pleuritic chest pain to which he attributes to his recent COPD exacerbations. Patient has been taking Tesselon and Tussionex without relief. We will d/c Tussionex and add Hycodan q 6. -Continue on supplemental O2 and continuous pulse Ox/Telemetry -Target SaO2 betweenn 89-95% -Continue Dueneb, Dulera, Incruse Ellipta, Singulair, Prednisone, and Azithromycin -Continue Tesslon and add Hycodan.   2. HLD Non-obstructive CAD PAD Patient's LDL is 148 which is up from 46 last year. We  will follow-up with patient regarding medication compliance before determining appropriate action. -Continue Lipitor 40 mg daily -ASA 81 mg  3. T2DM Diabetic neuropathy A1c 8.6% on 9/27. CBG currently 179 with a goal around 150. Patient is taking Prednisone. -CTM CBG in setting of steroids -Continue on Metformin, Jardiance, and SSI -Continue Lyrica '100mg'$  TID -Re-start plioglitazone at discharge   4. Aortic Valve Stenosis mild, stage B Followed by [name]. Asymptomatic. -Manage comorbidity: HLD, DM.   Diet: Carb-Modified VTE: Enoxaparin Code: Full  LOS: 1 day  [name], Medical Student 07/19/2022, 8:06 AM

## 2022-07-20 DIAGNOSIS — Z87891 Personal history of nicotine dependence: Secondary | ICD-10-CM | POA: Diagnosis not present

## 2022-07-20 DIAGNOSIS — J441 Chronic obstructive pulmonary disease with (acute) exacerbation: Secondary | ICD-10-CM | POA: Diagnosis present

## 2022-07-20 DIAGNOSIS — J45909 Unspecified asthma, uncomplicated: Secondary | ICD-10-CM | POA: Diagnosis not present

## 2022-07-20 DIAGNOSIS — Z79899 Other long term (current) drug therapy: Secondary | ICD-10-CM | POA: Diagnosis not present

## 2022-07-20 DIAGNOSIS — I2510 Atherosclerotic heart disease of native coronary artery without angina pectoris: Secondary | ICD-10-CM | POA: Diagnosis present

## 2022-07-20 DIAGNOSIS — T380X5A Adverse effect of glucocorticoids and synthetic analogues, initial encounter: Secondary | ICD-10-CM | POA: Diagnosis present

## 2022-07-20 DIAGNOSIS — E1165 Type 2 diabetes mellitus with hyperglycemia: Secondary | ICD-10-CM | POA: Diagnosis present

## 2022-07-20 DIAGNOSIS — J45901 Unspecified asthma with (acute) exacerbation: Secondary | ICD-10-CM | POA: Diagnosis present

## 2022-07-20 DIAGNOSIS — E1142 Type 2 diabetes mellitus with diabetic polyneuropathy: Secondary | ICD-10-CM | POA: Diagnosis present

## 2022-07-20 DIAGNOSIS — Z7982 Long term (current) use of aspirin: Secondary | ICD-10-CM | POA: Diagnosis not present

## 2022-07-20 DIAGNOSIS — Z7984 Long term (current) use of oral hypoglycemic drugs: Secondary | ICD-10-CM | POA: Diagnosis not present

## 2022-07-20 DIAGNOSIS — I350 Nonrheumatic aortic (valve) stenosis: Secondary | ICD-10-CM | POA: Diagnosis present

## 2022-07-20 DIAGNOSIS — Z91013 Allergy to seafood: Secondary | ICD-10-CM | POA: Diagnosis not present

## 2022-07-20 DIAGNOSIS — I10 Essential (primary) hypertension: Secondary | ICD-10-CM | POA: Diagnosis present

## 2022-07-20 DIAGNOSIS — E785 Hyperlipidemia, unspecified: Secondary | ICD-10-CM | POA: Diagnosis present

## 2022-07-20 DIAGNOSIS — D509 Iron deficiency anemia, unspecified: Secondary | ICD-10-CM | POA: Diagnosis present

## 2022-07-20 DIAGNOSIS — Z20822 Contact with and (suspected) exposure to covid-19: Secondary | ICD-10-CM | POA: Diagnosis present

## 2022-07-20 DIAGNOSIS — F419 Anxiety disorder, unspecified: Secondary | ICD-10-CM | POA: Diagnosis present

## 2022-07-20 LAB — BASIC METABOLIC PANEL
Anion gap: 9 (ref 5–15)
BUN: 21 mg/dL (ref 8–23)
CO2: 27 mmol/L (ref 22–32)
Calcium: 8.9 mg/dL (ref 8.9–10.3)
Chloride: 101 mmol/L (ref 98–111)
Creatinine, Ser: 1.02 mg/dL (ref 0.61–1.24)
GFR, Estimated: >60 mL/min (ref >60)
Glucose, Bld: 136 mg/dL — ABNORMAL HIGH (ref 70–99)
Potassium: 4.3 mmol/L (ref 3.5–5.1)
Sodium: 137 mmol/L (ref 135–145)

## 2022-07-20 LAB — CBC
HCT: 40.2 % (ref 39.0–52.0)
Hemoglobin: 12.8 g/dL — ABNORMAL LOW (ref 13.0–17.0)
MCH: 26.4 pg (ref 26.0–34.0)
MCHC: 31.8 g/dL (ref 30.0–36.0)
MCV: 83.1 fL (ref 80.0–100.0)
Platelets: 259 10*3/uL (ref 150–400)
RBC: 4.84 MIL/uL (ref 4.22–5.81)
RDW: 14.2 % (ref 11.5–15.5)
WBC: 7.1 10*3/uL (ref 4.0–10.5)
nRBC: 0 % (ref 0.0–0.2)

## 2022-07-20 LAB — GLUCOSE, CAPILLARY
Glucose-Capillary: 130 mg/dL — ABNORMAL HIGH (ref 70–99)
Glucose-Capillary: 186 mg/dL — ABNORMAL HIGH (ref 70–99)
Glucose-Capillary: 319 mg/dL — ABNORMAL HIGH (ref 70–99)

## 2022-07-20 MED ORDER — PREDNISONE 20 MG PO TABS
60.0000 mg | ORAL_TABLET | Freq: Every day | ORAL | Status: DC
  Administered 2022-07-21: 60 mg via ORAL
  Filled 2022-07-20: qty 3

## 2022-07-20 MED ORDER — GUAIFENESIN-DM 100-10 MG/5ML PO SYRP
5.0000 mL | ORAL_SOLUTION | Freq: Two times a day (BID) | ORAL | Status: DC
  Administered 2022-07-20 – 2022-07-21 (×3): 5 mL via ORAL
  Filled 2022-07-20 (×3): qty 5

## 2022-07-20 MED ORDER — PREDNISONE 20 MG PO TABS
20.0000 mg | ORAL_TABLET | Freq: Once | ORAL | Status: AC
  Administered 2022-07-20: 20 mg via ORAL
  Filled 2022-07-20: qty 1

## 2022-07-20 MED ORDER — INSULIN GLARGINE-YFGN 100 UNIT/ML ~~LOC~~ SOLN
7.0000 [IU] | Freq: Every day | SUBCUTANEOUS | Status: DC
  Administered 2022-07-20: 7 [IU] via SUBCUTANEOUS
  Filled 2022-07-20 (×2): qty 0.07

## 2022-07-20 NOTE — Progress Notes (Signed)
   Subjective:  Patient states he is feeling better than yesterday and has not been coughing as much. Chest pain and dyspnea both improved. He has been able to get up and walk around without any setbacks since the one episode yesterday.  Objective:  Vital signs in last 24 hours: Vitals:   07/20/22 0730 07/20/22 0740 07/20/22 0742 07/20/22 0811  BP:    127/72  Pulse:    (!) 104  Resp:    19  Temp:    98.8 F (37.1 C)  TempSrc:    Oral  SpO2: 98% 98% 98% 96%  Weight:      Height:       Weight change:   Intake/Output Summary (Last 24 hours) at 07/20/2022 1236 Last data filed at 07/19/2022 1244 Gross per 24 hour  Intake 360 ml  Output --  Net 360 ml   Physical Exam: General: lying in bed in no acute distress Head: normocephalic, atraumatic Cardiovascular: regular rate and rhythm without murmurs, rubs, or gallops, no LEE Respiratory: mildly increased work of breathing, mild wheezing bilaterally (improving) Abdominal: no tenderness to palpation Skin: warm, dry, well-perfused Neurological: alert and oriented x 3 Psychological: Normal mood and affect   Assessment/Plan:  Principal Problem:   COPD exacerbation (HCC) Active Problems:   COPD with acute exacerbation (HCC)   Asthma/COPD exacerbation with chest pain Vitals stable and saturating well on RA. Patient's dyspnea and chest pain still present but improving. Patient given Solumedrol in the ED on admission and is on day 3 of 4 Prednisone 40 mg (for 5 doses total). Patient on day 3 of 5 Azithromycin.  Patient's chest pain is being controlled with Tylenol 650 q 6, Lidocaine patch, and Robitussin. Patient's telemetry discontinued but CTM pulse oximetry.  -Continue on supplemental O2 (if needed) and continuous pulse Ox -Target SaO2 betweenn 89-95% -Continue Dueneb, Dulera, Incruse Ellipta, Singulair, Prednisone, and Azithromycin -Continue Tylenol, Lidocaine, and Robitussin   2. HLD Non-obstructive CAD PAD Patient's LDL  is 148 which is up from 32 last year. Patient states he had not been taking his Lipitor regularly because "I just have so many pills to keep up with." Patient instructed on importance of medication adherence and is amenable. -Continue Lipitor 40 mg daily -ASA 81 mg   3. T2DM Diabetic neuropathy A1c 8.6% on 9/27. CBG currently 203 with a goal around 150. Patient is taking Prednisone. Diabetes coordinator consulted and recommended "Novolog 3-4 units TID meal coverage if eating >50% of meals to prevent postprandial spikes due to PO prednisone dose." -CTM CBG in setting of steroids -Continue on Metformin, Jardiance, and SSI -Continue Lyrica '100mg'$  TID -Re-start plioglitazone at discharge    4. Aortic Valve Stenosis mild, stage B Followed by [name]. Asymptomatic. -Manage comorbidity: HLD, DM.    5. Substance Use Disorder Patient has a history of substance abuse (most notably cocaine) but has not used any illegal drugs in "many years". Patient wants to avoid any narcotic medication.   Diet: Normal VTE: Enoxaparin Code: Full  LOS: 3 days  [name], Medical Student 07/20/2022, 12:36 PM

## 2022-07-20 NOTE — Progress Notes (Addendum)
Peak Flow is 150-170 x 2 pre treatment, post treatment PEF is 250-300 w/ better air movement noted.    Per pt, he states he feels his breathing is slightly improved this morning compared to yesterday.

## 2022-07-20 NOTE — Progress Notes (Signed)
Peak flow pre: 150 l/m Post: 210 l/m

## 2022-07-20 NOTE — Inpatient Diabetes Management (Addendum)
Inpatient Diabetes Program Recommendations  AACE/ADA: New Consensus Statement on Inpatient Glycemic Control (2015)  Target Ranges:  Prepandial:   less than 140 mg/dL      Peak postprandial:   less than 180 mg/dL (1-2 hours)      Critically ill patients:  140 - 180 mg/dL   Lab Results  Component Value Date   GLUCAP 130 (H) 07/20/2022   HGBA1C 8.6 (A) 06/14/2022    Review of Glycemic Control  Latest Reference Range & Units 07/19/22 08:29 07/19/22 12:13 07/19/22 16:07 07/19/22 21:39 07/20/22 07:42  Glucose-Capillary 70 - 99 mg/dL 155 (H)  Novolog 3 units 179 (H)  Novolog 3 units 281 (H)  Novolog 8 units 203 (H)  Novolog 2 units 130 (H)  Novolog 2 units   Diabetes history: DM 2 Outpatient Diabetes medications: Trulicity 3 mg weekly, Synjardy 12.02-999 mg bid, Actos 30 mg Daily Current orders for Inpatient glycemic control:  Novolog 0-15 units tid + hs Metformin 1000 mg bid Jardiance 10 mg bid  PO prednisone 40 mg Daily  Inpatient Diabetes Program Recommendations:    -  start Novolog 3-4 units tid meal coverage if eating >50% of meals to prevent postprandial spikes due to PO prednisone dose  Thanks,  Tama Headings RN, MSN, BC-ADM Inpatient Diabetes Coordinator Team Pager [phone] (8a-5p)

## 2022-07-20 NOTE — Progress Notes (Signed)
Pt had an asthma attack while RN in room. PRN neb given with relief. Labored breathing and violent coughing noted with high pitched wheezing. Pt sitting bedside recuperating. Call bell placed within reach. Will continue to monitor and maintain safety.

## 2022-07-21 LAB — BASIC METABOLIC PANEL
Anion gap: 13 (ref 5–15)
BUN: 18 mg/dL (ref 8–23)
CO2: 22 mmol/L (ref 22–32)
Calcium: 8.8 mg/dL — ABNORMAL LOW (ref 8.9–10.3)
Chloride: 100 mmol/L (ref 98–111)
Creatinine, Ser: 1.03 mg/dL (ref 0.61–1.24)
GFR, Estimated: >60 mL/min (ref >60)
Glucose, Bld: 248 mg/dL — ABNORMAL HIGH (ref 70–99)
Potassium: 3.8 mmol/L (ref 3.5–5.1)
Sodium: 135 mmol/L (ref 135–145)

## 2022-07-21 LAB — CBC
HCT: 39.3 % (ref 39.0–52.0)
Hemoglobin: 12.5 g/dL — ABNORMAL LOW (ref 13.0–17.0)
MCH: 26.5 pg (ref 26.0–34.0)
MCHC: 31.8 g/dL (ref 30.0–36.0)
MCV: 83.3 fL (ref 80.0–100.0)
Platelets: 240 10*3/uL (ref 150–400)
RBC: 4.72 MIL/uL (ref 4.22–5.81)
RDW: 14 % (ref 11.5–15.5)
WBC: 6.9 10*3/uL (ref 4.0–10.5)
nRBC: 0 % (ref 0.0–0.2)

## 2022-07-21 LAB — GLUCOSE, CAPILLARY
Glucose-Capillary: 132 mg/dL — ABNORMAL HIGH (ref 70–99)
Glucose-Capillary: 224 mg/dL — ABNORMAL HIGH (ref 70–99)

## 2022-07-21 MED ORDER — ATORVASTATIN CALCIUM 40 MG PO TABS: 40.0000 mg | ORAL_TABLET | Freq: Every day | ORAL | 3 refills | Status: DC

## 2022-07-21 MED ORDER — IPRATROPIUM-ALBUTEROL 0.5-2.5 (3) MG/3ML IN SOLN: 3.0000 mL | Freq: Four times a day (QID) | RESPIRATORY_TRACT | 3 refills | Status: DC | PRN

## 2022-07-21 MED ORDER — ALBUTEROL SULFATE HFA 108 (90 BASE) MCG/ACT IN AERS: 1.0000 | INHALATION_SPRAY | RESPIRATORY_TRACT | 6 refills | Status: DC | PRN

## 2022-07-21 MED ORDER — PREDNISONE 20 MG PO TABS: 60.0000 mg | ORAL_TABLET | Freq: Every day | ORAL | 0 refills | Status: DC

## 2022-07-21 MED ORDER — TRELEGY ELLIPTA 100-62.5-25 MCG/ACT IN AEPB: 1.0000 | INHALATION_SPRAY | Freq: Every day | RESPIRATORY_TRACT | 3 refills | Status: DC

## 2022-07-21 MED ORDER — TRULICITY 3 MG/0.5ML ~~LOC~~ SOAJ: 3.0000 mg | SUBCUTANEOUS | 11 refills | Status: DC

## 2022-07-21 MED ORDER — SYNJARDY 12.5-1000 MG PO TABS: 1.0000 | ORAL_TABLET | Freq: Two times a day (BID) | ORAL | 3 refills | Status: DC

## 2022-07-21 MED ORDER — PREGABALIN 100 MG PO CAPS: 100.0000 mg | ORAL_CAPSULE | Freq: Three times a day (TID) | ORAL | 2 refills | Status: DC

## 2022-07-21 NOTE — Discharge Instructions (Signed)
Please see your PCP in 1 week. Continue to work on smoking cessation using patches and gum. If you have to use your albuterol more than four times please call your doctor. You may need to go to the emergency room.

## 2022-07-21 NOTE — Discharge Summary (Addendum)
Name: Richard [name] MRN: [id number] DOB: September 06, 1961 61 y.o. PCP: [name], DO  Date of Admission: 07/17/2022  6:07 PM Date of Discharge: 07/21/2022 Attending Physician: Angelica Pou, MD  Discharge Diagnosis: COPD exacerbation   Discharge Medications: Allergies as of 07/21/2022       Reactions   Shellfish Allergy Anaphylaxis, Shortness Of Breath        Medication List     STOP taking these medications    Dulera 200-5 MCG/ACT Aero Generic drug: mometasone-formoterol   latanoprost 0.005 % ophthalmic solution Commonly known as: XALATAN   levocetirizine 5 MG tablet Commonly known as: XYZAL   ofloxacin 0.3 % ophthalmic solution Commonly known as: OCUFLOX   pentoxifylline 400 MG CR tablet Commonly known as: TRENTAL   silver sulfADIAZINE 1 % cream Commonly known as: Silvadene       TAKE these medications    Accu-Chek FastClix Lancets Misc Check blood sugar two times a day   Accu-Chek Guide test strip Generic drug: glucose blood USE TO CHECK BLOOD SUGARS TWICE DAILY   albuterol 108 (90 Base) MCG/ACT inhaler Commonly known as: VENTOLIN HFA Inhale 1-2 puffs into the lungs every 4 (four) hours as needed for wheezing or shortness of breath. INHALE 2 PUFFS INTO THE LUNGS EVERY 4 HOURS AS NEEDED FOR WHEEZING OR SHORTNESS OF BREATH What changed:  how much to take when to take this additional instructions Another medication with the same name was removed. Continue taking this medication, and follow the directions you see here.   ascorbic acid 500 MG tablet Commonly known as: VITAMIN C Take 500 mg by mouth daily.   aspirin EC 81 MG tablet Take 1 tablet (81 mg total) by mouth daily.   atorvastatin 40 MG tablet Commonly known as: LIPITOR Take 1 tablet (40 mg total) by mouth daily.   diclofenac sodium 1 % Gel Commonly known as: VOLTAREN Apply 4 g topically 4 (four) times daily as needed.   ibuprofen 200 MG tablet Commonly known as:  ADVIL Take 600 mg by mouth as needed for mild pain.   ipratropium-albuterol 0.5-2.5 (3) MG/3ML Soln Commonly known as: DUONEB Take 3 mLs by nebulization every 6 (six) hours as needed (for wheezing not relieved by albuterol inhaler).   montelukast 10 MG tablet Commonly known as: SINGULAIR Take 1 tablet (10 mg total) by mouth at bedtime.   MULTI FOR HIM 50+ PO Take 1 tablet by mouth daily.   nitroGLYCERIN 0.2 mg/hr patch Commonly known as: NITRODUR - Dosed in mg/24 hr Place 1 patch (0.2 mg total) onto the skin daily.   pioglitazone 30 MG tablet Commonly known as: ACTOS Take 1 tablet (30 mg total) by mouth daily.   predniSONE 20 MG tablet Commonly known as: DELTASONE Take 3 tablets (60 mg total) by mouth daily with breakfast. Start taking on: July 22, 2022   pregabalin 100 MG capsule Commonly known as: LYRICA Take 1 capsule (100 mg total) by mouth 3 (three) times daily.   Synjardy 12.02-999 MG Tabs Generic drug: Empagliflozin-metFORMIN HCl Take 1 tablet by mouth 2 (two) times daily.   Trelegy Ellipta 100-62.5-25 MCG/ACT Aepb Generic drug: Fluticasone-Umeclidin-Vilant Inhale 1 puff into the lungs daily.   Trulicity 3 UD/1.4HF Sopn Generic drug: Dulaglutide Inject 3 mg as directed once a week.        Disposition and follow-up:   Richard [name] was discharged from [facility] in Good condition.  At the hospital follow up visit please address:  1.  Blood glucose level after current systemic steroids, pulmonology referral, ensure able to obtain inhalers, possible iron deficiency    2.  Labs / imaging needed at time of follow-up: none  3.  Pending labs/ test needing follow-up: None  Follow-up Appointments:  [facility] Course by problem list: Asthma/COPD exacerbation with chest pain Patient continued to have significant wheezing with prednisone, azithromycin, and LABA/LAMA/ICS inhalers.  His prednisone dose was increased to 60 mg daily  and patient notes that he has significant improvement in his symptoms.  He had no further coughing episodes and felt that he was able to move air much better.  On the day of discharge patient's vitals were stable and he was saturating well on RA. He will continue on prednisone '60mg'$  for 3 additional days for a total 5 day course.  -He will discharge with a prescription for his albuterol inhaler, duonebs, and new trelegy ellipta. We will also refer the patient to pulmonology (via [facility] f/u appt) given his recurrent exacerbations and his sense that severity of attacks is increasing .  We are hopeful that Trelegy will help reduce exacerbations.  He will also continue Singulair.  2. HLD Non-obstructive CAD PAD Patient's LDL is 148 which is up from 46 last year. Patient states he had not been taking his Lipitor regularly because "I just have so many pills to keep up with." Patient instructed on importance of medication adherence during this hospitalization and is amenable. Will discharge patient with new script for Lipitor 40 mg daily. He will continue his ASA '81mg'$  as well.    3. T2DM with steroid induced hyperglycemia Diabetic neuropathy A1c 8.6% on 9/27. Patient had some steroid induced hyperglycemia and required insulin therapy while inpatient. Patient is discharging on systemic steroid therapy and will resume all of his antidiabetic medications, including trulicity and pioglitazone. He will also continue his home lyrica.    4. Aortic Valve Stenosis mild, stage B Followed by [name].      5. Iron deficiency anemia  Mild. Hgb 12.5 on discharge with MCV 83.3. Patient with ferritin of 15. TSAT 20%. -Patient will follow up with PCP in 1 week and discuss if he needs oral iron supplementation.    Discharge Exam:   BP (!) 145/84 (BP Location: Left Arm)   Pulse 83   Temp 98.5 F (36.9 C) (Oral)   Resp 18   Ht '6\' 3"'$  (1.905 m)   Wt 97.1 kg   SpO2 96%   BMI 26.75 kg/m  Discharge exam:    Constitutional: Well-developed, well-nourished, and in no distress.  Cardiovascular: Normal rate, regular rhythm, intact distal pulses. No gallop and no friction rub.  No murmur heard. No lower extremity edema  Pulmonary: Non labored breathing on room air, no wheezing or rales  Abdominal: Soft. Normal bowel sounds. Non distended and non tender Musculoskeletal: Normal range of motion.        General: No tenderness or edema.  Neurological: Alert and oriented to person, place, and time. Non focal  Skin: Skin is warm and dry.    Pertinent Labs, Studies, and Procedures:        Component Ref Range & Units 3 d ago 6 yr ago  Iron 45 - 182 ug/dL 77 103 R  TIBC 250 - 450 ug/dL 392 337  Saturation Ratios 17.9 - 39.5 % 20   UIBC ug/dL 315           Component Ref Range & Units 3 d ago 6 yr  ago  Ferritin 24 - 336 ng/mL 15 Low               Component Ref Range & Units 3 d ago 2 yr ago 5 yr ago 6 yr ago  Cholesterol 0 - 200 mg/dL 225 High    195  Triglycerides <150 mg/dL 14 54 R 66 R 188 High   HDL >40 mg/dL 74 56 R 51 R 44  Total CHOL/HDL Ratio RATIO 3.0 2.0 R, CM 2.2 R, CM 4.4  VLDL 0 - 40 mg/dL 3   38  LDL Cholesterol 0 - 99 mg/dL 148 High         DG Chest Portable 1 View  Result Date: 07/17/2022 CLINICAL DATA:  Shortness of breath. Wheezing. Chest pain. EXAM: PORTABLE CHEST 1 VIEW COMPARISON:  Radiograph 06/10/2022. Chest CT 07/19/2020 FINDINGS: The cardiomediastinal contours are normal. Emphysematous changes on prior CT are not well demonstrated by radiograph. There is central bronchial thickening. Pulmonary vasculature is normal. No consolidation, pleural effusion, or pneumothorax. No acute osseous abnormalities are seen. IMPRESSION: 1. Central bronchial thickening. 2. No focal airspace disease. Electronically Signed   By: Keith Rake M.D.   On: 07/17/2022 19:09      Signed: [name], MD 07/21/2022, 11:52 AM

## 2022-07-23 ENCOUNTER — Telehealth: Payer: Self-pay

## 2022-07-23 LAB — GLUCOSE, CAPILLARY: Glucose-Capillary: 170 mg/dL — ABNORMAL HIGH (ref 70–99)

## 2022-07-23 NOTE — Patient Outreach (Signed)
  Care Coordination TOC Note Transition Care Management Follow-up Telephone Call Date of discharge and from where: [facility] 07/17/22-07/21/22 How have you been since you were released from the hospital? "Today is a good day, I feel great!" Any questions or concerns? No  Items Reviewed: Did the pt receive and understand the discharge instructions provided? Yes  Medications obtained and verified? Yes  Other? No  Any new allergies since your discharge? No  Dietary orders reviewed? No Do you have support at home? Yes   Home Care and Equipment/Supplies: Were home health services ordered? no If so, what is the name of the agency? N/a  Has the agency set up a time to come to the patient's home? no Were any new equipment or medical supplies ordered?  No What is the name of the medical supply agency? N/A Were you able to get the supplies/equipment? no Do you have any questions related to the use of the equipment or supplies? No  Functional Questionnaire: (I = Independent and D = Dependent) ADLs:  I  Bathing/Dressing- I  Meal Prep- I  Eating- I  Maintaining continence- I  Transferring/Ambulation- I  Managing Meds- I  Follow up appointments reviewed:  PCP Hospital f/u appt confirmed? Yes  Scheduled to see Dr. Heber  on 09/27/22 @ 10:10. [facility] f/u appt confirmed? No   Are transportation arrangements needed? No  If their condition worsens, is the pt aware to call PCP or go to the Emergency Dept.? Yes Was the patient provided with contact information for the PCP's office or ED? Yes Was to pt encouraged to call back with questions or concerns? Yes  SDOH assessments and interventions completed:   Yes  Care Coordination Interventions Activated:  Yes   Care Coordination Interventions:  No Care Coordination interventions needed at this time.   Encounter Outcome:  Pt. Visit Completed

## 2022-08-15 ENCOUNTER — Ambulatory Visit (INDEPENDENT_AMBULATORY_CARE_PROVIDER_SITE_OTHER): Payer: Medicare Other | Admitting: Podiatry

## 2022-08-15 DIAGNOSIS — M79674 Pain in right toe(s): Secondary | ICD-10-CM | POA: Diagnosis not present

## 2022-08-15 DIAGNOSIS — M79675 Pain in left toe(s): Secondary | ICD-10-CM

## 2022-08-15 DIAGNOSIS — B351 Tinea unguium: Secondary | ICD-10-CM | POA: Diagnosis not present

## 2022-08-15 NOTE — Progress Notes (Signed)
   Chief Complaint  Patient presents with   Nail Problem    RFC Bilateral nail trim   Callouses    Bilateral calluses    SUBJECTIVE Patient with a history of diabetes mellitus presents to office today complaining of elongated, thickened nails that cause pain while ambulating in shoes.  Patient is unable to trim their own nails.  Patient also has symptomatic calluses to the bilateral feet that need to be addressed and debrided today as well.  Patient is here for further evaluation and treatment.  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 Derm Skin is dry and supple bilateral. Negative open lesions or macerations. Remaining integument unremarkable. Nails are tender, long, thickened and dystrophic with subungual debris, consistent with onychomycosis, 1-5 bilateral. No signs of infection noted.  Hyperkeratotic preulcerative callus tissue also noted to the bilateral feet Vasc  DP and PT pedal pulses palpable bilaterally. Temperature gradient within normal limits.  Neuro Epicritic and protective threshold sensation diminished  bilaterally.  Musculoskeletal Exam history of partial fourth and fifth ray amputations left foot  ASSESSMENT 1. Diabetes Mellitus w/ peripheral neuropathy 2.  Pain due to onychomycosis of toenails bilateral 3.  Preulcerative calluses bilateral feet 4.  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 Patient evaluated today. 2. Instructed to maintain good pedal hygiene and foot care. Stressed importance of controlling blood sugar.  3. Mechanical debridement of nails 1-5 bilaterally performed using a nail nipper. Filed with dremel without incident.  4.  Excisional debridement of the hyperkeratotic preulcerative calluses was also performed today using a 312 scalpel without incident or bleeding.   5.  Return to clinic in 3 mos.     Edrick Kins, DPM [facility]  Dr. Edrick Kins, DPM    [address], [place] [address]                Office [phone]  Fax [phone]

## 2022-08-22 ENCOUNTER — Emergency Department (HOSPITAL_COMMUNITY): Payer: Medicare Other

## 2022-08-22 ENCOUNTER — Other Ambulatory Visit: Payer: Self-pay

## 2022-08-22 ENCOUNTER — Other Ambulatory Visit (HOSPITAL_COMMUNITY): Payer: Self-pay

## 2022-08-22 ENCOUNTER — Inpatient Hospital Stay (HOSPITAL_COMMUNITY)
Admission: EM | Admit: 2022-08-22 | Discharge: 2022-08-23 | DRG: 191 | Disposition: A | Discharge status: Home / Self Care | Payer: Medicare Other | Attending: Internal Medicine | Admitting: Internal Medicine

## 2022-08-22 ENCOUNTER — Inpatient Hospital Stay (HOSPITAL_COMMUNITY): Payer: Medicare Other

## 2022-08-22 DIAGNOSIS — Z7984 Long term (current) use of oral hypoglycemic drugs: Secondary | ICD-10-CM | POA: Diagnosis not present

## 2022-08-22 DIAGNOSIS — E1142 Type 2 diabetes mellitus with diabetic polyneuropathy: Secondary | ICD-10-CM | POA: Diagnosis present

## 2022-08-22 DIAGNOSIS — I10 Essential (primary) hypertension: Secondary | ICD-10-CM | POA: Diagnosis present

## 2022-08-22 DIAGNOSIS — I2510 Atherosclerotic heart disease of native coronary artery without angina pectoris: Secondary | ICD-10-CM | POA: Diagnosis present

## 2022-08-22 DIAGNOSIS — Z1152 Encounter for screening for COVID-19: Secondary | ICD-10-CM

## 2022-08-22 DIAGNOSIS — I350 Nonrheumatic aortic (valve) stenosis: Secondary | ICD-10-CM | POA: Diagnosis present

## 2022-08-22 DIAGNOSIS — Z89422 Acquired absence of other left toe(s): Secondary | ICD-10-CM | POA: Diagnosis not present

## 2022-08-22 DIAGNOSIS — Z79899 Other long term (current) drug therapy: Secondary | ICD-10-CM | POA: Diagnosis not present

## 2022-08-22 DIAGNOSIS — Z91013 Allergy to seafood: Secondary | ICD-10-CM

## 2022-08-22 DIAGNOSIS — J45901 Unspecified asthma with (acute) exacerbation: Secondary | ICD-10-CM | POA: Diagnosis present

## 2022-08-22 DIAGNOSIS — Z7951 Long term (current) use of inhaled steroids: Secondary | ICD-10-CM

## 2022-08-22 DIAGNOSIS — J441 Chronic obstructive pulmonary disease with (acute) exacerbation: Secondary | ICD-10-CM | POA: Diagnosis present

## 2022-08-22 DIAGNOSIS — E785 Hyperlipidemia, unspecified: Secondary | ICD-10-CM | POA: Diagnosis present

## 2022-08-22 DIAGNOSIS — F419 Anxiety disorder, unspecified: Secondary | ICD-10-CM | POA: Diagnosis present

## 2022-08-22 DIAGNOSIS — F17210 Nicotine dependence, cigarettes, uncomplicated: Secondary | ICD-10-CM | POA: Diagnosis present

## 2022-08-22 DIAGNOSIS — Z7982 Long term (current) use of aspirin: Secondary | ICD-10-CM

## 2022-08-22 DIAGNOSIS — Z8249 Family history of ischemic heart disease and other diseases of the circulatory system: Secondary | ICD-10-CM

## 2022-08-22 DIAGNOSIS — Z7985 Long-term (current) use of injectable non-insulin antidiabetic drugs: Secondary | ICD-10-CM | POA: Diagnosis not present

## 2022-08-22 DIAGNOSIS — J4489 Other specified chronic obstructive pulmonary disease: Secondary | ICD-10-CM

## 2022-08-22 DIAGNOSIS — Z83511 Family history of glaucoma: Secondary | ICD-10-CM

## 2022-08-22 LAB — I-STAT VENOUS BLOOD GAS, ED
Acid-base deficit: 1 mmol/L (ref 0.0–2.0)
Bicarbonate: 24.6 mmol/L (ref 20.0–28.0)
Calcium, Ion: 1.14 mmol/L — ABNORMAL LOW (ref 1.15–1.40)
HCT: 39 % (ref 39.0–52.0)
Hemoglobin: 13.3 g/dL (ref 13.0–17.0)
O2 Saturation: 98 %
Potassium: 5.1 mmol/L (ref 3.5–5.1)
Sodium: 138 mmol/L (ref 135–145)
TCO2: 26 mmol/L (ref 22–32)
pCO2, Ven: 42 mmHg — ABNORMAL LOW (ref 44–60)
pH, Ven: 7.374 (ref 7.25–7.43)
pO2, Ven: 113 mmHg — ABNORMAL HIGH (ref 32–45)

## 2022-08-22 LAB — I-STAT CHEM 8, ED
BUN: 13 mg/dL (ref 8–23)
Calcium, Ion: 1.19 mmol/L (ref 1.15–1.40)
Chloride: 106 mmol/L (ref 98–111)
Creatinine, Ser: 0.8 mg/dL (ref 0.61–1.24)
Glucose, Bld: 281 mg/dL — ABNORMAL HIGH (ref 70–99)
HCT: 39 % (ref 39.0–52.0)
Hemoglobin: 13.3 g/dL (ref 13.0–17.0)
Potassium: 4.4 mmol/L (ref 3.5–5.1)
Sodium: 139 mmol/L (ref 135–145)
TCO2: 22 mmol/L (ref 22–32)

## 2022-08-22 LAB — CBC WITH DIFFERENTIAL/PLATELET
Abs Immature Granulocytes: 0.06 10*3/uL (ref 0.00–0.07)
Basophils Absolute: 0.1 10*3/uL (ref 0.0–0.1)
Basophils Relative: 1 %
Eosinophils Absolute: 0.5 10*3/uL (ref 0.0–0.5)
Eosinophils Relative: 9 %
HCT: 40.9 % (ref 39.0–52.0)
Hemoglobin: 13 g/dL (ref 13.0–17.0)
Immature Granulocytes: 1 %
Lymphocytes Relative: 21 %
Lymphs Abs: 1.2 10*3/uL (ref 0.7–4.0)
MCH: 27.4 pg (ref 26.0–34.0)
MCHC: 31.8 g/dL (ref 30.0–36.0)
MCV: 86.1 fL (ref 80.0–100.0)
Monocytes Absolute: 0.7 10*3/uL (ref 0.1–1.0)
Monocytes Relative: 11 %
Neutro Abs: 3.3 10*3/uL (ref 1.7–7.7)
Neutrophils Relative %: 57 %
Platelets: 268 10*3/uL (ref 150–400)
RBC: 4.75 MIL/uL (ref 4.22–5.81)
RDW: 14.1 % (ref 11.5–15.5)
WBC: 5.8 10*3/uL (ref 4.0–10.5)
nRBC: 0 % (ref 0.0–0.2)

## 2022-08-22 LAB — HEPATIC FUNCTION PANEL
ALT: 20 U/L (ref 0–44)
AST: 25 U/L (ref 15–41)
Albumin: 3.6 g/dL (ref 3.5–5.0)
Alkaline Phosphatase: 92 U/L (ref 38–126)
Bilirubin, Direct: 0.2 mg/dL (ref 0.0–0.2)
Indirect Bilirubin: 0.4 mg/dL (ref 0.3–0.9)
Total Bilirubin: 0.6 mg/dL (ref 0.3–1.2)
Total Protein: 6.9 g/dL (ref 6.5–8.1)

## 2022-08-22 LAB — BASIC METABOLIC PANEL
Anion gap: 11 (ref 5–15)
Anion gap: 11 (ref 5–15)
Anion gap: 12 (ref 5–15)
BUN: 11 mg/dL (ref 8–23)
BUN: 13 mg/dL (ref 8–23)
BUN: 15 mg/dL (ref 8–23)
CO2: 22 mmol/L (ref 22–32)
CO2: 21 mmol/L — ABNORMAL LOW (ref 22–32)
CO2: 19 mmol/L — ABNORMAL LOW (ref 22–32)
Calcium: 9 mg/dL (ref 8.9–10.3)
Calcium: 9 mg/dL (ref 8.9–10.3)
Calcium: 8.5 mg/dL — ABNORMAL LOW (ref 8.9–10.3)
Chloride: 106 mmol/L (ref 98–111)
Chloride: 108 mmol/L (ref 98–111)
Chloride: 108 mmol/L (ref 98–111)
Creatinine, Ser: 0.99 mg/dL (ref 0.61–1.24)
Creatinine, Ser: 0.9 mg/dL (ref 0.61–1.24)
Creatinine, Ser: 0.95 mg/dL (ref 0.61–1.24)
GFR, Estimated: >60 mL/min (ref >60)
GFR, Estimated: >60 mL/min (ref >60)
GFR, Estimated: >60 mL/min (ref >60)
Glucose, Bld: 233 mg/dL — ABNORMAL HIGH (ref 70–99)
Glucose, Bld: 275 mg/dL — ABNORMAL HIGH (ref 70–99)
Glucose, Bld: 169 mg/dL — ABNORMAL HIGH (ref 70–99)
Potassium: 4.2 mmol/L (ref 3.5–5.1)
Potassium: 4.5 mmol/L (ref 3.5–5.1)
Potassium: 4.4 mmol/L (ref 3.5–5.1)
Sodium: 139 mmol/L (ref 135–145)
Sodium: 140 mmol/L (ref 135–145)
Sodium: 139 mmol/L (ref 135–145)

## 2022-08-22 LAB — TROPONIN I (HIGH SENSITIVITY)
Troponin I (High Sensitivity): 4 ng/L (ref <18)
Troponin I (High Sensitivity): 5 ng/L (ref <18)

## 2022-08-22 LAB — RESPIRATORY PANEL BY PCR

## 2022-08-22 LAB — RESP PANEL BY RT-PCR (FLU A&B, COVID) ARPGX2
Influenza A by PCR: NEGATIVE
Influenza B by PCR: NEGATIVE
SARS Coronavirus 2 by RT PCR: NEGATIVE

## 2022-08-22 LAB — CBC
HCT: 39.6 % (ref 39.0–52.0)
Hemoglobin: 12.7 g/dL — ABNORMAL LOW (ref 13.0–17.0)
MCH: 27.2 pg (ref 26.0–34.0)
MCHC: 32.1 g/dL (ref 30.0–36.0)
MCV: 84.8 fL (ref 80.0–100.0)
Platelets: 246 10*3/uL (ref 150–400)
RBC: 4.67 MIL/uL (ref 4.22–5.81)
RDW: 14.3 % (ref 11.5–15.5)
WBC: 8.9 10*3/uL (ref 4.0–10.5)
nRBC: 0 % (ref 0.0–0.2)

## 2022-08-22 LAB — PHOSPHORUS: Phosphorus: 4.8 mg/dL — ABNORMAL HIGH (ref 2.5–4.6)

## 2022-08-22 LAB — BRAIN NATRIURETIC PEPTIDE: B Natriuretic Peptide: 38 pg/mL (ref 0.0–100.0)

## 2022-08-22 LAB — CBG MONITORING, ED
Glucose-Capillary: 332 mg/dL — ABNORMAL HIGH (ref 70–99)
Glucose-Capillary: 293 mg/dL — ABNORMAL HIGH (ref 70–99)

## 2022-08-22 LAB — GLUCOSE, CAPILLARY: Glucose-Capillary: 184 mg/dL — ABNORMAL HIGH (ref 70–99)

## 2022-08-22 LAB — MAGNESIUM: Magnesium: 2.3 mg/dL (ref 1.7–2.4)

## 2022-08-22 MED ORDER — INSULIN ASPART 100 UNIT/ML IJ SOLN
0.0000 [IU] | Freq: Three times a day (TID) | INTRAMUSCULAR | Status: DC
  Administered 2022-08-22: 4 [IU] via SUBCUTANEOUS
  Administered 2022-08-22: 3 [IU] via SUBCUTANEOUS

## 2022-08-22 MED ORDER — ALBUTEROL SULFATE (2.5 MG/3ML) 0.083% IN NEBU
INHALATION_SOLUTION | RESPIRATORY_TRACT | Status: AC
  Filled 2022-08-22: qty 12

## 2022-08-22 MED ORDER — LIDOCAINE 5 % EX PTCH
1.0000 | MEDICATED_PATCH | Freq: Once | CUTANEOUS | Status: AC
  Administered 2022-08-23: 1 via TRANSDERMAL
  Filled 2022-08-22: qty 1

## 2022-08-22 MED ORDER — METFORMIN HCL 500 MG PO TABS
1000.0000 mg | ORAL_TABLET | Freq: Two times a day (BID) | ORAL | Status: DC
  Administered 2022-08-22 – 2022-08-23 (×3): 1000 mg via ORAL
  Filled 2022-08-22 (×3): qty 2

## 2022-08-22 MED ORDER — AZITHROMYCIN 500 MG PO TABS
250.0000 mg | ORAL_TABLET | Freq: Every day | ORAL | Status: DC
  Administered 2022-08-22 – 2022-08-23 (×2): 250 mg via ORAL
  Filled 2022-08-22 (×3): qty 1

## 2022-08-22 MED ORDER — EMPAGLIFLOZIN-METFORMIN HCL 12.5-1000 MG PO TABS: 1.0000 | ORAL_TABLET | Freq: Two times a day (BID) | ORAL | Status: DC

## 2022-08-22 MED ORDER — LACTATED RINGERS IV BOLUS
1000.0000 mL | Freq: Once | INTRAVENOUS | Status: AC
  Administered 2022-08-22: 1000 mL via INTRAVENOUS

## 2022-08-22 MED ORDER — ONDANSETRON HCL 4 MG/2ML IJ SOLN: 4.0000 mg | Freq: Once | INTRAMUSCULAR | Status: DC

## 2022-08-22 MED ORDER — IOHEXOL 350 MG/ML SOLN
80.0000 mL | Freq: Once | INTRAVENOUS | Status: AC | PRN
  Administered 2022-08-22: 80 mL via INTRAVENOUS

## 2022-08-22 MED ORDER — MORPHINE SULFATE (PF) 4 MG/ML IV SOLN: 4.0000 mg | Freq: Once | INTRAVENOUS | Status: DC

## 2022-08-22 MED ORDER — MONTELUKAST SODIUM 10 MG PO TABS
10.0000 mg | ORAL_TABLET | Freq: Every day | ORAL | Status: DC
  Administered 2022-08-22: 10 mg via ORAL
  Filled 2022-08-22: qty 1

## 2022-08-22 MED ORDER — INSULIN ASPART 100 UNIT/ML IJ SOLN: 0.0000 [IU] | Freq: Every day | INTRAMUSCULAR | Status: DC

## 2022-08-22 MED ORDER — INSULIN ASPART 100 UNIT/ML IJ SOLN
0.0000 [IU] | Freq: Three times a day (TID) | INTRAMUSCULAR | Status: DC
  Administered 2022-08-23: 3 [IU] via SUBCUTANEOUS
  Administered 2022-08-23: 8 [IU] via SUBCUTANEOUS

## 2022-08-22 MED ORDER — ALBUTEROL (5 MG/ML) CONTINUOUS INHALATION SOLN
10.0000 mg/h | INHALATION_SOLUTION | Freq: Once | RESPIRATORY_TRACT | Status: AC
  Administered 2022-08-22: 10 mg/h via RESPIRATORY_TRACT
  Filled 2022-08-22: qty 20

## 2022-08-22 MED ORDER — INSULIN ASPART 100 UNIT/ML IJ SOLN
4.0000 [IU] | Freq: Three times a day (TID) | INTRAMUSCULAR | Status: DC
  Administered 2022-08-22 – 2022-08-23 (×3): 4 [IU] via SUBCUTANEOUS

## 2022-08-22 MED ORDER — IOHEXOL 350 MG/ML SOLN
75.0000 mL | Freq: Once | INTRAVENOUS | Status: AC | PRN
  Administered 2022-08-22: 75 mL via INTRAVENOUS

## 2022-08-22 MED ORDER — LORATADINE 10 MG PO TABS
10.0000 mg | ORAL_TABLET | Freq: Every day | ORAL | Status: DC
  Administered 2022-08-22 – 2022-08-23 (×2): 10 mg via ORAL
  Filled 2022-08-22 (×2): qty 1

## 2022-08-22 MED ORDER — ASPIRIN 81 MG PO TBEC
81.0000 mg | DELAYED_RELEASE_TABLET | Freq: Every day | ORAL | Status: DC
  Administered 2022-08-22 – 2022-08-23 (×2): 81 mg via ORAL
  Filled 2022-08-22 (×2): qty 1

## 2022-08-22 MED ORDER — ENOXAPARIN SODIUM 40 MG/0.4ML IJ SOSY
40.0000 mg | PREFILLED_SYRINGE | INTRAMUSCULAR | Status: DC
  Administered 2022-08-22 – 2022-08-23 (×2): 40 mg via SUBCUTANEOUS
  Filled 2022-08-22 (×2): qty 0.4

## 2022-08-22 MED ORDER — UMECLIDINIUM BROMIDE 62.5 MCG/ACT IN AEPB
1.0000 | INHALATION_SPRAY | Freq: Every day | RESPIRATORY_TRACT | Status: DC
  Administered 2022-08-23: 1 via RESPIRATORY_TRACT
  Filled 2022-08-22: qty 7

## 2022-08-22 MED ORDER — IPRATROPIUM-ALBUTEROL 0.5-2.5 (3) MG/3ML IN SOLN: 3.0000 mL | Freq: Four times a day (QID) | RESPIRATORY_TRACT | Status: DC | PRN

## 2022-08-22 MED ORDER — NICOTINE 21 MG/24HR TD PT24
21.0000 mg | MEDICATED_PATCH | Freq: Every day | TRANSDERMAL | Status: DC
  Administered 2022-08-22 – 2022-08-23 (×2): 21 mg via TRANSDERMAL
  Filled 2022-08-22 (×2): qty 1

## 2022-08-22 MED ORDER — ALBUTEROL SULFATE (2.5 MG/3ML) 0.083% IN NEBU
3.0000 mL | INHALATION_SOLUTION | RESPIRATORY_TRACT | Status: DC | PRN
  Administered 2022-08-22 (×2): 3 mL via RESPIRATORY_TRACT
  Filled 2022-08-22 (×2): qty 3
  Filled 2022-08-22: qty 6

## 2022-08-22 MED ORDER — FLUTICASONE FUROATE-VILANTEROL 100-25 MCG/ACT IN AEPB
1.0000 | INHALATION_SPRAY | Freq: Every day | RESPIRATORY_TRACT | Status: DC
  Administered 2022-08-23: 1 via RESPIRATORY_TRACT
  Filled 2022-08-22: qty 28

## 2022-08-22 MED ORDER — DEXTROSE 5 % IV SOLN
250.0000 mg | INTRAVENOUS | Status: DC
  Filled 2022-08-22: qty 2.5

## 2022-08-22 MED ORDER — PREDNISONE 20 MG PO TABS
40.0000 mg | ORAL_TABLET | Freq: Every day | ORAL | Status: DC
  Administered 2022-08-22 – 2022-08-23 (×2): 40 mg via ORAL
  Filled 2022-08-22 (×2): qty 2

## 2022-08-22 MED ORDER — GUAIFENESIN-DM 100-10 MG/5ML PO SYRP
5.0000 mL | ORAL_SOLUTION | ORAL | Status: DC | PRN
  Administered 2022-08-22 (×3): 5 mL via ORAL
  Filled 2022-08-22 (×4): qty 5

## 2022-08-22 MED ORDER — EMPAGLIFLOZIN 25 MG PO TABS
25.0000 mg | ORAL_TABLET | Freq: Every day | ORAL | Status: DC
  Administered 2022-08-22 – 2022-08-23 (×2): 25 mg via ORAL
  Filled 2022-08-22 (×3): qty 1

## 2022-08-22 MED ORDER — PREGABALIN 100 MG PO CAPS
100.0000 mg | ORAL_CAPSULE | Freq: Three times a day (TID) | ORAL | Status: DC
  Administered 2022-08-22 – 2022-08-23 (×5): 100 mg via ORAL
  Filled 2022-08-22 (×5): qty 1

## 2022-08-22 MED ORDER — ATORVASTATIN CALCIUM 40 MG PO TABS
40.0000 mg | ORAL_TABLET | Freq: Every day | ORAL | Status: DC
  Administered 2022-08-22 – 2022-08-23 (×2): 40 mg via ORAL
  Filled 2022-08-22 (×2): qty 1

## 2022-08-22 MED ORDER — IPRATROPIUM-ALBUTEROL 0.5-2.5 (3) MG/3ML IN SOLN
3.0000 mL | Freq: Four times a day (QID) | RESPIRATORY_TRACT | Status: DC
  Administered 2022-08-22 – 2022-08-23 (×4): 3 mL via RESPIRATORY_TRACT
  Filled 2022-08-22 (×5): qty 3

## 2022-08-22 MED ORDER — FLUTICASONE PROPIONATE 50 MCG/ACT NA SUSP
1.0000 | Freq: Every day | NASAL | Status: DC
  Administered 2022-08-22 – 2022-08-23 (×2): 1 via NASAL
  Filled 2022-08-22: qty 16
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
## 2022-08-22 NOTE — ED Notes (Signed)
Checked patient cbg it was 332 patient is resting with call bell in reach

## 2022-08-22 NOTE — Progress Notes (Signed)
Pt transported form ED31 to CT and back with no complications.

## 2022-08-22 NOTE — ED Notes (Signed)
Checked patient cbg it was 293 noticed RN Ginny of blood sugar patient is resting with call bell in reach

## 2022-08-22 NOTE — ED Notes (Signed)
Pt c/o of chest tightness and Sob RT at bedside to assist in replacing the bipap to help pt relieve from symptoms

## 2022-08-22 NOTE — Progress Notes (Signed)
Inpatient Diabetes Program Recommendations  AACE/ADA: New Consensus Statement on Inpatient Glycemic Control (2015)  Target Ranges:  Prepandial:   less than 140 mg/dL      Peak postprandial:   less than 180 mg/dL (1-2 hours)      Critically ill patients:  140 - 180 mg/dL   Lab Results  Component Value Date   GLUCAP 332 (H) 08/22/2022   HGBA1C 8.6 (A) 06/14/2022    Review of Glycemic Control  Latest Reference Range & Units 08/22/22 08:39  Glucose-Capillary 70 - 99 mg/dL 332 (H)   Diabetes history: DM 2 Outpatient Diabetes medications:  Trulicity 3 mg weekly, Synjardy 12.02-999 mg bid, Actos 30 mg daily Current orders for Inpatient glycemic control:  Novolog 0-6 units tid with meals Metformin 1000 mg bid Jardiance 25 mg daily Prednisone 40 mg q AM  Inpatient Diabetes Program Recommendations:    Please consider increasing Novolog correction to moderate tid with meals and HS.  May also need addition of Novolog meal coverage due to patient being on PO Prednisone.  Will follow.   Thanks,  Adah Perl, RN, BC-ADM Inpatient Diabetes Coordinator Pager [phone]  (8a-5p)

## 2022-08-22 NOTE — ED Provider Notes (Signed)
[facility] EMERGENCY DEPARTMENT Provider Note   CSN: [id number] Arrival date & time: 08/22/22  0047     History  Chief Complaint  Patient presents with   Respiratory Distress    Richard [name] is a 61 y.o. male who presents via EMS with respiratory distress.  Patient with history of COPD who states that he has had worsening shortness of breath for the last 3 hours.  Saturations in the 90s on room air with EMS arrival, however patient's work of breathing was significantly increased with tachypnea to the 40s and patient unable to speak secondary to shortness of breath.  Placed on CPAP in route.  Patient was administered 10 mg of albuterol, 0.5 of Atrovent, 125 mg of Solu-Medrol and 2 g of magnesium.  Left eye due to acuity of presentation upon arrival.  Patient is also able to communicate that he has been having chills for 24 hours and was vomiting yesterday with left-sided belly pain.  I personally read this patient's medical records.  Has history of asthma and COPD, type 2 diabetes, CAD, PAD, mediastinal mass which has been stable for the last few years, hypertension.  Patient is not anticoagulated.  HPI     Home Medications Prior to Admission medications   Medication Sig Start Date End Date Taking? Authorizing Provider  albuterol (VENTOLIN HFA) 108 (90 Base) MCG/ACT inhaler Inhale 1-2 puffs into the lungs every 4 (four) hours as needed for wheezing or shortness of breath. INHALE 2 PUFFS INTO THE LUNGS EVERY 4 HOURS AS NEEDED FOR WHEEZING OR SHORTNESS OF BREATH Patient taking differently: Inhale 1-2 puffs into the lungs every 4 (four) hours as needed for wheezing or shortness of breath. 07/21/22  Yes [name], MD  ascorbic acid (VITAMIN C) 500 MG tablet Take 500 mg by mouth daily.   Yes [provider]  aspirin EC 81 MG EC tablet Take 1 tablet (81 mg total) by mouth daily. 09/26/15  Yes Burns, Arloa Koh, MD  atorvastatin (LIPITOR) 40 MG tablet Take  1 tablet (40 mg total) by mouth daily. 07/21/22  Yes [name], MD  Empagliflozin-metFORMIN HCl [facility]) 12.02-999 MG TABS Take 1 tablet by mouth 2 (two) times daily. 07/21/22  Yes [name], MD  Fluticasone-Umeclidin-Vilant (TRELEGY ELLIPTA) 100-62.5-25 MCG/ACT AEPB Inhale 1 puff into the lungs daily. 07/21/22  Yes [name], MD  ibuprofen (ADVIL) 200 MG tablet Take 600 mg by mouth as needed for mild pain.   Yes [provider]  ipratropium-albuterol (DUONEB) 0.5-2.5 (3) MG/3ML SOLN Take 3 mLs by nebulization every 6 (six) hours as needed (for wheezing not relieved by albuterol inhaler). 07/21/22  Yes [name], MD  montelukast (SINGULAIR) 10 MG tablet Take 1 tablet (10 mg total) by mouth at bedtime. 03/08/22  Yes [name], DO  Multiple Vitamins-Minerals (MULTI FOR HIM 50+ PO) Take 1 tablet by mouth daily.   Yes [provider]  pioglitazone (ACTOS) 30 MG tablet Take 1 tablet (30 mg total) by mouth daily. 06/14/22  Yes [name], DO  pregabalin (LYRICA) 100 MG capsule Take 1 capsule (100 mg total) by mouth 3 (three) times daily. 07/21/22  Yes [name], MD  Accu-Chek FastClix Lancets MISC Check blood sugar two times a day 09/07/20   [name], DO  Dulaglutide (TRULICITY) 3 ST/4.1DQ SOPN Inject 3 mg as directed once a week. Patient taking differently: Inject 3 mg as directed once a week. Sunday 07/21/22   [name], MD  glucose  blood (ACCU-CHEK GUIDE) test strip USE TO CHECK BLOOD SUGARS TWICE DAILY 08/16/21   [name], DO  nitroGLYCERIN (NITRODUR - DOSED IN MG/24 HR) 0.2 mg/hr patch Place 1 patch (0.2 mg total) onto the skin daily. Patient not taking: Reported on 08/22/2022 09/28/20   Persons, Bevely Palmer, [place]      Allergies    Shellfish allergy    Review of Systems   Review of Systems  Unable to perform ROS: Acuity of condition  Respiratory:  Positive for cough, chest tightness and shortness of breath.    Cardiovascular:  Positive for chest pain. Negative for palpitations and leg swelling.  Gastrointestinal:  Positive for abdominal pain, nausea and vomiting. Negative for diarrhea.    Physical Exam Updated Vital Signs BP 136/75 (BP Location: Right Arm)   Pulse (!) 106   Temp 97.8 F (36.6 C) (Axillary)   Resp 17   SpO2 100%  Physical Exam Vitals and nursing note reviewed.  Constitutional:      General: He is in acute distress (Respiratory distress).     Appearance: He is ill-appearing. He is not toxic-appearing.  HENT:     Head: Normocephalic and atraumatic.     Nose: Nose normal.     Mouth/Throat:     Mouth: Mucous membranes are moist.     Pharynx: Oropharynx is clear. Uvula midline. No oropharyngeal exudate or posterior oropharyngeal erythema.  Eyes:     General: Lids are normal. Vision grossly intact.        Right eye: No discharge.        Left eye: No discharge.     Extraocular Movements: Extraocular movements intact.     Conjunctiva/sclera: Conjunctivae normal.     Pupils: Pupils are equal, round, and reactive to light.  Cardiovascular:     Rate and Rhythm: Regular rhythm. Tachycardia present.     Pulses: Normal pulses.     Heart sounds: Normal heart sounds.  Pulmonary:     Effort: Tachypnea, accessory muscle usage, prolonged expiration, respiratory distress and retractions present.     Breath sounds: Decreased air movement present. Examination of the right-upper field reveals wheezing. Examination of the left-upper field reveals wheezing. Examination of the right-middle field reveals wheezing. Examination of the left-middle field reveals wheezing. Examination of the right-lower field reveals wheezing. Examination of the left-lower field reveals wheezing. Wheezing present. No rales.  Chest:     Chest wall: No mass, lacerations, swelling, tenderness, crepitus or edema.  Abdominal:     General: Bowel sounds are normal. There is no distension.     Palpations: Abdomen is  soft.     Tenderness: There is abdominal tenderness in the left lower quadrant. There is guarding. There is no right CVA tenderness, left CVA tenderness or rebound. Negative signs include Murphy's sign and McBurney's sign.  Musculoskeletal:        General: No deformity.     Cervical back: Normal range of motion and neck supple.     Right lower leg: No edema.     Left lower leg: No edema.  Skin:    General: Skin is warm and dry.     Capillary Refill: Capillary refill takes less than 2 seconds.  Neurological:     General: No focal deficit present.     Mental Status: He is alert and oriented to person, place, and time. Mental status is at baseline.  Psychiatric:        Mood and Affect: Mood normal.  ED Results / Procedures / Treatments   Labs (all labs ordered are listed, but only abnormal results are displayed) Labs Reviewed  BASIC METABOLIC PANEL - Abnormal; Notable for the following components:      Result Value   Glucose, Bld 233 (*)    All other components within normal limits  RESP PANEL BY RT-PCR (FLU A&B, COVID) ARPGX2  CULTURE, BLOOD (ROUTINE X 2)  CULTURE, BLOOD (ROUTINE X 2)  RESPIRATORY PANEL BY PCR  CBC WITH DIFFERENTIAL/PLATELET  BRAIN NATRIURETIC PEPTIDE  HEPATIC FUNCTION PANEL  MAGNESIUM  PHOSPHORUS  BASIC METABOLIC PANEL  CBC  I-STAT VENOUS BLOOD GAS, ED  I-STAT CHEM 8, ED  TROPONIN I (HIGH SENSITIVITY)  TROPONIN I (HIGH SENSITIVITY)    EKG EKG Interpretation  Date/Time:  Wednesday August 22 2022 00:56:37 EST Ventricular Rate:  117 PR Interval:  158 QRS Duration: 89 QT Interval:  331 QTC Calculation: 462 R Axis:   70 Text Interpretation: Sinus tachycardia Probable left atrial enlargement Confirmed by Thayer Jew [phone]) on 08/22/2022 1:00:12 AM  Radiology CT ABDOMEN PELVIS W CONTRAST  Result Date: 08/22/2022 CLINICAL DATA:  Left lower quadrant abdominal pain EXAM: CT ABDOMEN AND PELVIS WITH CONTRAST TECHNIQUE: Multidetector CT imaging  of the abdomen and pelvis was performed using the standard protocol following bolus administration of intravenous contrast. RADIATION DOSE REDUCTION: This exam was performed according to the departmental dose-optimization program which includes automated exposure control, adjustment of the mA and/or kV according to patient size and/or use of iterative reconstruction technique. CONTRAST:  89m OMNIPAQUE IOHEXOL 350 MG/ML SOLN COMPARISON:  05/28/2017 FINDINGS: Lower chest: Lung bases are clear. Hepatobiliary: Liver is within normal limits. Gallbladder is unremarkable. No intrahepatic or extrahepatic ductal dilatation. Pancreas: Within normal limits. Spleen: Within normal limits. Adrenals/Urinary Tract: Adrenal glands are within normal limits. 11 mm lateral left upper pole renal cyst (series 4/image 36), measuring simple fluid density, benign (Bosniak I). No follow-up is recommended. Right kidney is within normal limits. No hydronephrosis. Bladder is within normal limits. Stomach/Bowel: Stomach is within normal limits. No evidence of bowel obstruction. Appendix not discretely visualized. No colonic wall thickening or inflammatory changes. Vascular/Lymphatic: No evidence of abdominal aortic aneurysm. Atherosclerotic calcifications of the abdominal aorta and branch vessels. No suspicious abdominopelvic lymphadenopathy. Reproductive: Prostate is unremarkable. Other: No abdominopelvic ascites. Musculoskeletal: Degenerative changes of the visualized thoracolumbar spine. IMPRESSION: No CT findings to account for the patient's left lower quadrant abdominal pain. Electronically Signed   By: SJulian HyM.D.   On: 08/22/2022 03:11   DG Chest Port 1 View  Result Date: 08/22/2022 CLINICAL DATA:  Shortness of breath EXAM: PORTABLE CHEST 1 VIEW COMPARISON:  07/17/2022 FINDINGS: Lungs are clear.  No pleural effusion or pneumothorax. The heart is normal in size. IMPRESSION: No evidence of acute cardiopulmonary disease.  Electronically Signed   By: SJulian HyM.D.   On: 08/22/2022 01:10    Procedures Procedures    Medications Ordered in ED Medications  albuterol (PROVENTIL) (2.5 MG/3ML) 0.083% nebulizer solution (0 mg  Hold 08/22/22 0114)  ondansetron (ZOFRAN) injection 4 mg (0 mg Intravenous Hold 08/22/22 0333)  morphine (PF) 4 MG/ML injection 4 mg (0 mg Intravenous Hold 08/22/22 0333)  lactated ringers bolus 1,000 mL (has no administration in time range)  ipratropium-albuterol (DUONEB) 0.5-2.5 (3) MG/3ML nebulizer solution 3 mL (3 mLs Nebulization Given 08/22/22 0358)  enoxaparin (LOVENOX) injection 40 mg (has no administration in time range)  albuterol (PROVENTIL,VENTOLIN) solution continuous neb (10 mg/hr Nebulization Given 08/22/22 0103)  iohexol (OMNIPAQUE) 350 MG/ML injection 75 mL (75 mLs Intravenous Contrast Given 08/22/22 0303)    ED Course/ Medical Decision Making/ A&P Clinical Course as of 08/22/22 0407  Wed Aug 22, 2022  0340 Consult to internal medicine residents who are agreeable admitting this patient to their service.  Appreciate collaboration in care of this patient. [RS]    Clinical Course User Index [RS] Keagen Heinlen, Gypsy Balsam, PA-C                           Medical Decision Making 61 year old male presents in respiratory distress.  Patient on CPAP of, arrival, transitioned to BiPAP the bedside with respiratory therapy present.  Oxygen saturation 100% on BiPAP, work of breathing improving the patient does continue to have accessory muscle use, retractions, wheezing throughout the lung fields and tachypnea.  When asked if he is more comfortable with BiPAP patient gives a thumbs up.  Exquisite tenderness palpation the left lower quadrant on abdominal exam.  No rebound.  No lower extremity edema  DDx includes limited to COPD exacerbation, infectious etiology such as pneumonia, PE, pleural effusion, ACS.  Regards to his abdominal pain, DDx includes is not limited to  diverticulitis, appendicitis, mesenteric ischemia, bowel obstruction.  Amount and/or Complexity of Data Reviewed Labs: ordered.    Details: CBC without leukocytosis or anemia, BMP unremarkable, hepatic function normal.  Troponin negative.  Radiology: ordered.    Details:  Chest x-ray negative for acute cardiopulmonary disease.  CT abdomen pelvis without acute findings to explain patient's left lower quadrant tenderness.  Appear constipated.    Risk Prescription drug management. Decision regarding hospitalization.   Patient reevaluated and continues to improve on BiPAP.  Continues to have decreased air movement in the bases though wheezing is significantly improved.  Patient is resting calmly at this time.  Patient's oxygen saturations are still normal, however he remains on BiPAP for work of breathing at this time.  Do feel he would benefit from admission the hospital this time for COPD exacerbation.  Ayman  voiced understanding of her medical evaluation and treatment plan. Each of their questions answered to their expressed satisfaction.  He is amenable to plan for admission at this time.  Patient admitted to internal medicine service.  This chart was dictated using voice recognition software, Dragon. Despite the best efforts of this provider to proofread and correct errors, errors may still occur which can change documentation meaning.  Final Clinical Impression(s) / ED Diagnoses Final diagnoses:  COPD exacerbation [facility])    Rx / DC Orders ED Discharge Orders     None         Emeline Darling, PA-C 08/22/22 0407    Merryl Hacker, MD 08/22/22 [phone]

## 2022-08-22 NOTE — ED Notes (Signed)
Patient taken off BIPAP at this time per admit MD request.  Patient currently able to speak in full sentences.  Maintaining saturations on room air.  Will continue to monitor

## 2022-08-22 NOTE — ED Notes (Signed)
Patient reports improvement after breathing treatment.  Will continue to monitor

## 2022-08-22 NOTE — Progress Notes (Signed)
Patient placed on BIPAP due to increased work of breathing. RT will continue to monitor.

## 2022-08-22 NOTE — Hospital Course (Addendum)
Feels better now than when he came in. Dues have some chest and abdominal soreness from coughing so much. Thinks that change in weather, struggling to stop smoking were triggers. Winter used to be better time of year and summer was worst but now any change in weather is bad. Chest pain shifts in location, sometimes in tightness. Sometimes whole chest feels tight, sometimes feels like he has gas, sometimes just hurts period. No particular spot. Sometimes has to stop at top of steps from chest pain, had a stress test and didn't make it far he says. Is getting tired talking now. Doesn't know if it's moreso having to stop from cp or shob. Has finals after thanksgiving. Tries to cook everything. Has seasonal allergies. Doesn't take nasal spray but does otc allergy meds. No trouble swallowing food.  Atorva, jardiance, breo ellipta, robitussin, insulin, duoneb, metformin, montelukast, zofran, prednisone, pregabalin, azithromycin

## 2022-08-22 NOTE — ED Notes (Signed)
Patient noted with increased wheezing.  PRN albuterol given

## 2022-08-22 NOTE — ED Triage Notes (Signed)
Patient BIB EMS for evaluation of respiratory distress.  Reports increased WOB and respiratory distress x 3 hours.  Hx of COPD.  Given Solu-Medrol 125 mg IV, Albuterol 10 mg/Atrovent 500 mcg Neb, and Magnesium 2 grams IV by EMS.  Patient arrived on CPAP.  C/o vomiting and abdominal pain yesterday.  No reports of fevers.  Has had chills

## 2022-08-23 ENCOUNTER — Other Ambulatory Visit (HOSPITAL_COMMUNITY): Payer: Self-pay

## 2022-08-23 LAB — CBC
HCT: 36.6 % — ABNORMAL LOW (ref 39.0–52.0)
Hemoglobin: 11.7 g/dL — ABNORMAL LOW (ref 13.0–17.0)
MCH: 27 pg (ref 26.0–34.0)
MCHC: 32 g/dL (ref 30.0–36.0)
MCV: 84.3 fL (ref 80.0–100.0)
Platelets: 235 10*3/uL (ref 150–400)
RBC: 4.34 MIL/uL (ref 4.22–5.81)
RDW: 14.5 % (ref 11.5–15.5)
WBC: 7.8 10*3/uL (ref 4.0–10.5)
nRBC: 0 % (ref 0.0–0.2)

## 2022-08-23 LAB — BASIC METABOLIC PANEL
Anion gap: 8 (ref 5–15)
BUN: 18 mg/dL (ref 8–23)
CO2: 22 mmol/L (ref 22–32)
Calcium: 8.7 mg/dL — ABNORMAL LOW (ref 8.9–10.3)
Chloride: 104 mmol/L (ref 98–111)
Creatinine, Ser: 1.05 mg/dL (ref 0.61–1.24)
GFR, Estimated: >60 mL/min (ref >60)
Glucose, Bld: 202 mg/dL — ABNORMAL HIGH (ref 70–99)
Potassium: 4 mmol/L (ref 3.5–5.1)
Sodium: 134 mmol/L — ABNORMAL LOW (ref 135–145)

## 2022-08-23 LAB — GLUCOSE, CAPILLARY
Glucose-Capillary: 271 mg/dL — ABNORMAL HIGH (ref 70–99)
Glucose-Capillary: 177 mg/dL — ABNORMAL HIGH (ref 70–99)
Glucose-Capillary: 296 mg/dL — ABNORMAL HIGH (ref 70–99)

## 2022-08-23 MED ORDER — ACETAMINOPHEN 325 MG PO TABS
650.0000 mg | ORAL_TABLET | Freq: Four times a day (QID) | ORAL | Status: DC | PRN
  Administered 2022-08-23: 650 mg via ORAL
  Filled 2022-08-23: qty 2

## 2022-08-23 MED ORDER — IPRATROPIUM-ALBUTEROL 0.5-2.5 (3) MG/3ML IN SOLN
3.0000 mL | RESPIRATORY_TRACT | Status: DC
  Administered 2022-08-23: 3 mL via RESPIRATORY_TRACT
  Filled 2022-08-23: qty 3

## 2022-08-23 MED ORDER — FLUTICASONE PROPIONATE 50 MCG/ACT NA SUSP
1.0000 | Freq: Every day | NASAL | 0 refills | Status: DC
  Filled 2022-08-23: qty 16, 60d supply, fill #0

## 2022-08-23 MED ORDER — PREDNISONE 20 MG PO TABS
40.0000 mg | ORAL_TABLET | Freq: Every day | ORAL | 0 refills | Status: DC
  Filled 2022-08-23: qty 6, 3d supply, fill #0

## 2022-08-23 MED ORDER — TRELEGY ELLIPTA 100-62.5-25 MCG/ACT IN AEPB
1.0000 | INHALATION_SPRAY | Freq: Every day | RESPIRATORY_TRACT | 3 refills | Status: DC
  Filled 2022-08-23: qty 28, fill #0

## 2022-08-23 MED ORDER — VARENICLINE TARTRATE (STARTER) 0.5 MG X 11 & 1 MG X 42 PO TBPK
ORAL_TABLET | ORAL | 0 refills | Status: DC
  Filled 2022-08-23: qty 53, 28d supply, fill #0

## 2022-08-23 MED ORDER — ALBUTEROL SULFATE HFA 108 (90 BASE) MCG/ACT IN AERS
1.0000 | INHALATION_SPRAY | RESPIRATORY_TRACT | 2 refills | Status: DC | PRN
  Filled 2022-08-23: qty 6.7, 17d supply, fill #0

## 2022-08-23 MED ORDER — IPRATROPIUM-ALBUTEROL 0.5-2.5 (3) MG/3ML IN SOLN
3.0000 mL | Freq: Four times a day (QID) | RESPIRATORY_TRACT | Status: DC
  Administered 2022-08-23: 3 mL via RESPIRATORY_TRACT
  Filled 2022-08-23: qty 3

## 2022-08-23 MED ORDER — AZITHROMYCIN 250 MG PO TABS
250.0000 mg | ORAL_TABLET | Freq: Every day | ORAL | 0 refills | Status: DC
  Filled 2022-08-23: qty 1, 1d supply, fill #0

## 2022-08-23 NOTE — Progress Notes (Signed)
Discharge paperwork reviewed with pt. Pt verbalized understanding. Pt alert and oriented x4 in no acute distress upon discharge. TOC medications given to pt. Pt has taken all of his belongings with him. Pt's friend has transported pt home via private vehicle.

## 2022-08-23 NOTE — Discharge Summary (Signed)
Name: Borden Thune MRN: [id number] DOB: [birth year] 61 y.o. PCP: [name], DO  Date of Admission: 08/22/2022 12:47 AM Date of Discharge: 08/23/2022 Attending Physician: Dr.  Saverio Danker  Discharge Diagnosis: Principal Problem:   COPD exacerbation [facility])    Discharge Medications: Allergies as of 08/23/2022       Reactions   Shellfish Allergy Anaphylaxis, Shortness Of Breath        Medication List     STOP taking these medications    ibuprofen 200 MG tablet Commonly known as: ADVIL       TAKE these medications    Accu-Chek FastClix Lancets Misc Check blood sugar two times a day   Accu-Chek Guide test strip Generic drug: glucose blood USE TO CHECK BLOOD SUGARS TWICE DAILY   albuterol 108 (90 Base) MCG/ACT inhaler Commonly known as: VENTOLIN HFA Inhale 1-2 puffs into the lungs every 4 (four) hours as needed for wheezing or shortness of breath.   ascorbic acid 500 MG tablet Commonly known as: VITAMIN C Take 500 mg by mouth daily.   aspirin EC 81 MG tablet Take 1 tablet (81 mg total) by mouth daily.   atorvastatin 40 MG tablet Commonly known as: LIPITOR Take 1 tablet (40 mg total) by mouth daily.   azithromycin 250 MG tablet Commonly known as: ZITHROMAX Take 1 tablet (250 mg total) by mouth daily. Start taking on: August 24, 2022   fluticasone 50 MCG/ACT nasal spray Commonly known as: FLONASE Place 1 spray into both nostrils daily. Start taking on: August 24, 2022   ipratropium-albuterol 0.5-2.5 (3) MG/3ML Soln Commonly known as: DUONEB Take 3 mLs by nebulization every 6 (six) hours as needed (for wheezing not relieved by albuterol inhaler).   montelukast 10 MG tablet Commonly known as: SINGULAIR Take 1 tablet (10 mg total) by mouth at bedtime.   MULTI FOR HIM 50+ PO Take 1 tablet by mouth daily.   nitroGLYCERIN 0.2 mg/hr patch Commonly known as: NITRODUR - Dosed in mg/24 hr Place 1 patch (0.2 mg total) onto the skin daily.    pioglitazone 30 MG tablet Commonly known as: ACTOS Take 1 tablet (30 mg total) by mouth daily.   predniSONE 20 MG tablet Commonly known as: DELTASONE Take 2 tablets (40 mg total) by mouth daily with breakfast. Start taking on: August 24, 2022   pregabalin 100 MG capsule Commonly known as: LYRICA Take 1 capsule (100 mg total) by mouth 3 (three) times daily.   Synjardy 12.02-999 MG Tabs Generic drug: Empagliflozin-metFORMIN HCl Take 1 tablet by mouth 2 (two) times daily.   Trelegy Ellipta 100-62.5-25 MCG/ACT Aepb Generic drug: Fluticasone-Umeclidin-Vilant Inhale 1 puff into the lungs daily.   Trulicity 3 ZH/2.9JM Sopn Generic drug: Dulaglutide Inject 3 mg as directed once a week. What changed: additional instructions   Varenicline Tartrate (Starter) 0.5 MG X 11 & 1 MG X 42 Tbpk Commonly known as: Chantix Starting Month Pak Take 0.5 mg by mouth daily for 3 days, THEN 0.5 mg 2 (two) times daily for 4 days, THEN 1 mg 2 (two) times daily for 21 days. Start taking on: August 23, 2022        Disposition and follow-up:   Mr.Wren Rayman was discharged from [facility] in Good condition.  At the hospital follow up visit please address:  1.  Follow-up:  a.  Please assess breathing, make sure he has refills of his Trelegy/albuterol inhalers, and make sure he has follow-up with pulmonology and has been contacted  by pulmonary rehab.    b.  See how he is doing with smoking cessation and refill Chantix if necessary.   c.please reassess diabetes regimen, CBGs elevated while he is on prednisone.  A1c due in December.  D.Please refer to behavioral health for anxiety, I cannot currently place the outpatient referral for behavioral health at discharge.  I informed him about family services of the [place].  He declined medical management of anxiety at this point, but please reassess   e.  Needs follow-up with [name], cardiology, for continued monitoring and  outpatient follow-up of aortic stenosis  2.  Labs / imaging needed at time of follow-up: CBG  3.  Pending labs/ test needing follow-up:   Follow-up Appointments:  Follow-up Information     [facility] Follow up on 09/04/2022.   Why: Please go to your appointment at 9:15.  Please call the clinic if you need to reschedule, would recommend you follow-up in 1 to 2 weeks. Contact information: [address] [place] [facility] Follow up on 09/06/2022.   Specialty: Pulmonology Why: Please arrive at least 15 minutes early for your appt at 10:30 am. Contact information: [address] [place] [address] [phone]               Ambulatory referral to pulmonary rehab placed on discharge.  Hospital Course by problem list: Mr. Loescher is a 61 y/o person living with a history of COPD/Asthma, tobacco use disorder, type 2 diabetes mellitus, peripheral neuropathy, HTN, HLD, non-obstructive CAD, aortic stenosis who presents with worsening shortness of breath that onset yesterday prior to arrival. and admitted for COPD/Asthma exacerbation.  COPD & Asthma Exacerbation 2017 PFT's with mixed picture indicating possible COPD or asthma.  Was admitted a month ago and discharged on Trelegy with marked improvement in symptoms.  Presented with acute onset dyspnea lasting a few hours and required BiPAP on initial admission.  VBG with normal pH, initial work-up unremarkable.  No clear etiology for exacerbation at this time.  Received Solu-Medrol in the ED as well as DuoNebs and [employer] by EMS en route.  Quickly improved after short period of time on BiPAP initially and was weaned to room air.  On exams during his admission, he had inspiratory wheezing and prolonged expiratory phase which improved over the course of his admission with DuoNeb treatment, steroids, azithromycin.  Because he  had been persistently sinus tachycardic since presentation and had symptoms of chest tightness despite treatment with well score indicating moderate probability of PE, he was scanned with CTA PE protocol which was negative.  He does additionally have significant component of anxiety which contributes to his symptoms, discussed below.  On day of discharge, his respiratory status was stable, he only had mild inspiratory wheezing, he had resolution of chest pain and discomfort, and felt ready to go home.  He ambulated with O2 sats above 93% on room air.  He will be discharged on Trelegy, albuterol as needed, montelukast daily, will finish his 3-day course of azithromycin and 5-day course of prednisone, and he was also advised to take his DuoNebs at home at least twice a day for the next few days.  He has follow-up arranged with the [facility] clinic, pulmonology and ambulatory referral for pulmonary rehab was placed at discharge.   Tobacco use disorder Continues to smoke 1/2 PPD but has decrease the amount and  is wanting to stop completely.  He was prescribed a starting dose of Chantix at discharge and instructed on appropriate use and provided smoking cessation resources.  Please reassess at outpatient follow-up.  Anxiety Patient endorses significant stress and anxiety related to his health issues and other stressors in his life.  This impacts his respiratory status, heart rate, and symptomology.  Discussed options to address this on day of discharge and he wanted to start with CBT.  Unable to place ambulatory referral for behavioral health in the inpatient setting, but referred him to family services of the [place] for counseling on smoking cessation resources.  Please follow-up in the outpatient setting to make sure he has access to counseling resources and consider medical management if he is amenable at that time.  Type 2 diabetes melliuts Peripheral neuropathy A1c 2 months ago of 8.6.  CBGs during  hospitalization were elevated on steroids.  He was restarted on his home diabetes regimen at discharge, please follow-up and titrate as needed.  HTN HLD Continue atorvastatin.  Please continue to watch in the outpatient setting  Non-obstructive CAD Continue aspirin. Presentation is not concerning for ACS. Chest pain pleuritic in nature and reproducible, likely from persistent cough. EKG without acute changes or findings concerning for ischemia.  High-sensitivity troponins negative   Aortic Valve Stenosis Follows with [name] of cardiology. Needs continue monitoring and outpatient follow up.  Subjective: Patient denies dyspnea, chest pain, nausea or vomiting.  He is ambulatory on room air without significant dyspnea.  He is tolerating p.o. intake.  Discussed the importance of smoking cessation, continuing his medication regimen, and outpatient follow-up with primary care, pulmonology, and pulmonary rehab.  He understands and is amenable to discharge today.  Discharge Vitals:   BP 138/82 (BP Location: Right Arm)   Pulse 99   Temp 99 F (37.2 C) (Oral)   Resp 18   Ht '6\' 3"'$  (1.905 m)   SpO2 99%   BMI 26.75 kg/m  Discharge exam: General: NAD CV: Tachycardic, regular rhythm.  4/6 systolic murmur in right upper sternal border Pulmonary: Normal work of breathing on room air.  Mild inspiratory wheezing and prolonged expiratory phase. Abdominal: Soft, nontender, nondistended. Normal bowel sounds. Skin: Warm and dry. No obvious rash or lesions.   Pertinent Labs, Studies, and Procedures:     Latest Ref Rng & Units 08/23/2022    8:29 AM 08/22/2022    4:17 AM 08/22/2022    4:16 AM  CBC  WBC 4.0 - 10.5 K/uL 7.8     Hemoglobin 13.0 - 17.0 g/dL 11.7  13.3  13.3   Hematocrit 39.0 - 52.0 % 36.6  39.0  39.0   Platelets 150 - 400 K/uL 235          Latest Ref Rng & Units 08/23/2022    7:18 AM 08/22/2022    6:03 PM 08/22/2022    4:17 AM  CMP  Glucose 70 - 99 mg/dL 202  169    BUN 8  - 23 mg/dL 18  15    Creatinine 0.61 - 1.24 mg/dL 1.05  0.95    Sodium 135 - 145 mmol/L 134  139  138   Potassium 3.5 - 5.1 mmol/L 4.0  4.4  5.1   Chloride 98 - 111 mmol/L 104  108    CO2 22 - 32 mmol/L 22  19    Calcium 8.9 - 10.3 mg/dL 8.7  8.5    Troponins 4, 5 BNP 38 Blood cultures negative x24  hours RVP negative VBG: PCO2 42, pH 7.37  CT Angio Chest Pulmonary Embolism (PE) W or WO Contrast  Result Date: 08/22/2022 CLINICAL DATA:  Dyspnea, chronic, chest wall or pleura disease suspected Pulmonary embolism (PE) suspected, high prob r/o PE EXAM: CT ANGIOGRAPHY CHEST WITH CONTRAST TECHNIQUE: Multidetector CT imaging of the chest was performed using the standard protocol during bolus administration of intravenous contrast. Multiplanar CT image reconstructions and MIPs were obtained to evaluate the vascular anatomy. RADIATION DOSE REDUCTION: This exam was performed according to the departmental dose-optimization program which includes automated exposure control, adjustment of the mA and/or kV according to patient size and/or use of iterative reconstruction technique. CONTRAST:  34m OMNIPAQUE IOHEXOL 350 MG/ML SOLN, 711mOMNIPAQUE IOHEXOL 350 MG/ML SOLN COMPARISON:  None Available. FINDINGS: Cardiovascular: Adequate opacification of the pulmonary arterial tree. No intraluminal filling defect identified to suggest acute pulmonary embolism. Central pulmonary arteries are of normal caliber. Moderate coronary artery calcification. Calcification of the aortic valve leaflets and mitral valve leaflets noted. Cardiac size within normal limits though left ventricular hypertrophy is noted. No pericardial effusion. Mild atherosclerotic calcification within the thoracic aorta. No aortic aneurysm. Mediastinum/Nodes: No enlarged mediastinal, hilar, or axillary lymph nodes. Thyroid gland, trachea, and esophagus demonstrate no significant findings. Lungs/Pleura: Mild paraseptal emphysema. There is diffuse bronchial  wall thickening in keeping with airway inflammation. No confluent pulmonary infiltrates. No pneumothorax or pleural effusion. No central obstructing lesion. Upper Abdomen: No acute abnormality. Musculoskeletal: No chest wall abnormality. No acute or significant osseous findings. Review of the MIP images confirms the above findings. IMPRESSION: 1. No pulmonary embolism. 2. Moderate coronary artery calcification. Calcification of the aortic and mitral valvular leaflets. Left ventricular hypertrophy. Echocardiography may be helpful for further evaluation. 3. Mild paraseptal emphysema. Diffuse bronchial wall thickening in keeping with airway inflammation. Aortic Atherosclerosis (ICD10-I70.0) and Emphysema (ICD10-J43.9). Electronically Signed   By: AsFidela Salisbury.D.   On: 08/22/2022 19:32   CT ABDOMEN PELVIS W CONTRAST  Result Date: 08/22/2022 CLINICAL DATA:  Left lower quadrant abdominal pain EXAM: CT ABDOMEN AND PELVIS WITH CONTRAST TECHNIQUE: Multidetector CT imaging of the abdomen and pelvis was performed using the standard protocol following bolus administration of intravenous contrast. RADIATION DOSE REDUCTION: This exam was performed according to the departmental dose-optimization program which includes automated exposure control, adjustment of the mA and/or kV according to patient size and/or use of iterative reconstruction technique. CONTRAST:  7511mMNIPAQUE IOHEXOL 350 MG/ML SOLN COMPARISON:  05/28/2017 FINDINGS: Lower chest: Lung bases are clear. Hepatobiliary: Liver is within normal limits. Gallbladder is unremarkable. No intrahepatic or extrahepatic ductal dilatation. Pancreas: Within normal limits. Spleen: Within normal limits. Adrenals/Urinary Tract: Adrenal glands are within normal limits. 11 mm lateral left upper pole renal cyst (series 4/image 36), measuring simple fluid density, benign (Bosniak I). No follow-up is recommended. Right kidney is within normal limits. No hydronephrosis. Bladder is  within normal limits. Stomach/Bowel: Stomach is within normal limits. No evidence of bowel obstruction. Appendix not discretely visualized. No colonic wall thickening or inflammatory changes. Vascular/Lymphatic: No evidence of abdominal aortic aneurysm. Atherosclerotic calcifications of the abdominal aorta and branch vessels. No suspicious abdominopelvic lymphadenopathy. Reproductive: Prostate is unremarkable. Other: No abdominopelvic ascites. Musculoskeletal: Degenerative changes of the visualized thoracolumbar spine. IMPRESSION: No CT findings to account for the patient's left lower quadrant abdominal pain. Electronically Signed   By: SriJulian HyD.   On: 08/22/2022 03:11   DG Chest Port 1 View  Result Date: 08/22/2022 CLINICAL DATA:  Shortness of breath EXAM:  PORTABLE CHEST 1 VIEW COMPARISON:  07/17/2022 FINDINGS: Lungs are clear.  No pleural effusion or pneumothorax. The heart is normal in size. IMPRESSION: No evidence of acute cardiopulmonary disease. Electronically Signed   By: Julian Hy M.D.   On: 08/22/2022 01:10     Discharge Instructions: Discharge Instructions     AMB referral to pulmonary rehabilitation   Complete by: As directed    Please select a program: Pulmonary Rehabilitation   Diagnosis: (See process instructions below for COPD requirements): COPD-Gold 2: Moderate 50% </= FEV1 <80% predicted   Program Prescription:  O2 Administration by RT, EP, or RN if SpO2<88% Flutter Valve if indicated     After initial evaluation and assessments completed: Virtual Based Care may be provided alone or in conjunction with Pulmonary Rehab/Respiratory Care services based on patient barriers.: Yes   Call MD for:  difficulty breathing, headache or visual disturbances   Complete by: As directed    Call MD for:  temperature >100.4   Complete by: As directed    Diet - low sodium heart healthy   Complete by: As directed    Discharge instructions   Complete by: As directed    1.   Please complete your courses of azithromycin 250 mg (1 more day) and prednisone 40 mg (3 more days).  For the next few days, try to take your nebulizer twice a day, which should help with your breathing.  Otherwise, continue to take your Trelegy every day and your albuterol inhaler as needed while you are away from home.  2.  You will be prescribed a starting pack of Chantix on discharge.  I would set a quit date when you plan on quitting cold [place].  Start taking the Chantix on this day with the lower dose initially and then the dose will ramp as prescribed until you reach the maintenance dose after 1 week.  If you complete a month of Chantix and are still working on quitting smoking/feel cravings, call the clinic and they can refill the prescription.  3.  You should get phone calls from the pulmonologist and from pulmonary rehab.  You have a clinic appointment with [place] on 11/28 at 9:15 AM.  Please call the clinic to reschedule if this does not work for you, but I will try to get into see [place] within the next week or 2.   Increase activity slowly   Complete by: As directed        Discharge Instructions   None     Signed: Linus Galas, MD 08/23/2022, 4:17 PM   Pager: [phone]

## 2022-08-23 NOTE — Evaluation (Signed)
Physical Therapy One time Evaluation Patient Details Name: Richard [name] MRN: [id number] DOB: May 21, 1961 Today's Date: 08/23/2022  History of Present Illness  61 year old admittted 11/15 presenting with dyspnea and admitted with concern for COPD/asthma exacerbation.  PMH:  COPD/asthma overlap, tobacco use, noninsulin-dependent type 2 diabetes complicated by neuropathy, nonobstructive CAD, and aortic stenosis  Clinical Impression  Pt admitted with above diagnosis.  Pt currently without  functional limitations and is independent with mobility with no DOE noted with sats >93% on RA, good balance and good endurance today. No PT needs. Will sign off.     Recommendations for follow up therapy are one component of a multi-disciplinary discharge planning process, led by the attending physician.  Recommendations may be updated based on patient status, additional functional criteria and insurance authorization.  Follow Up Recommendations No PT follow up      Assistance Recommended at Discharge None  Patient can return home with the following       Equipment Recommendations None recommended by PT  Recommendations for Other Services       Functional Status Assessment Patient has not had a recent decline in their functional status     Precautions / Restrictions Precautions Precautions: None Restrictions Weight Bearing Restrictions: No      Mobility  Bed Mobility Overal bed mobility: Independent                  Transfers Overall transfer level: Independent                      Ambulation/Gait Ambulation/Gait assistance: Independent Gait Distance (Feet): 500 Feet Assistive device: None Gait Pattern/deviations: WFL(Within Functional Limits)   Gait velocity interpretation: 1.31 - 2.62 ft/sec, indicative of limited community ambulator   General Gait Details: No issues with challenges to balance.  Sats 93% on RA  Stairs Stairs:  (declined to practice)           Engineer, building services [place] (Stroke Patients Only)       Balance                                 Standardized Balance Assessment Standardized Balance Assessment : Dynamic Gait Index   Dynamic Gait Index Level Surface: Normal Change in Gait Speed: Normal Gait with Horizontal Head Turns: Normal Gait with Vertical Head Turns: Normal Gait and Pivot Turn: Normal Step Over Obstacle: Normal Step Around Obstacles: Normal Steps: Normal Total Score: 24       Pertinent Vitals/Pain Pain Assessment Pain Assessment: No/denies pain    Home Living Family/patient expects to be discharged to:: Private residence Living Arrangements: Alone Available Help at Discharge: Family;Available PRN/intermittently Type of Home: Apartment Home Access: Stairs to enter Entrance Stairs-Rails: Right;Left;Can reach both Entrance Stairs-Number of Steps: 4 Alternate Level Stairs-Number of Steps: flight Home Layout: Two level;1/2 bath on main level;Bed/bath upstairs Home Equipment: Cane - single point;BSC/3in1;Grab bars - tub/shower;Wheelchair - manual;Grab bars - toilet;Hand held shower head      Prior Function Prior Level of Function : Independent/Modified Independent (In school culinary)                     Hand Dominance   Dominant Hand: Left    Extremity/Trunk Assessment   Upper Extremity Assessment Upper Extremity Assessment: Defer to OT evaluation    Lower Extremity Assessment Lower Extremity Assessment: Overall WFL for tasks assessed  Cervical / Trunk Assessment Cervical / Trunk Assessment: Normal  Communication   Communication: No difficulties  Cognition Arousal/Alertness: Awake/alert Behavior During Therapy: WFL for tasks assessed/performed Overall Cognitive Status: Within Functional Limits for tasks assessed                                          General Comments General comments (skin integrity, edema, etc.): 100  bpm, 128/72    Exercises     Assessment/Plan    PT Assessment Patient does not need any further PT services  PT Problem List         PT Treatment Interventions      PT Goals (Current goals can be found in the Care Plan section)  Acute Rehab PT Goals Patient Stated Goal: to go home today PT Goal Formulation: All assessment and education complete, DC therapy    Frequency       Co-evaluation               AM-PAC PT "6 Clicks" Mobility  Outcome Measure Help needed turning from your back to your side while in a flat bed without using bedrails?: None Help needed moving from lying on your back to sitting on the side of a flat bed without using bedrails?: None Help needed moving to and from a bed to a chair (including a wheelchair)?: None Help needed standing up from a chair using your arms (e.g., wheelchair or bedside chair)?: None Help needed to walk in hospital room?: None Help needed climbing 3-5 steps with a railing? : None 6 Click Score: 24    End of Session Equipment Utilized During Treatment: Gait belt Activity Tolerance: Patient tolerated treatment well Patient left: with call bell/phone within reach;in bed Nurse Communication: Mobility status PT Visit Diagnosis: Muscle weakness (generalized) (M62.81)    Time: 1660-6004 PT Time Calculation (min) (ACUTE ONLY): 21 min   Charges:   PT Evaluation $PT Eval Low Complexity: 1 Low          Mase Dhondt M,PT Acute Rehab Services [phone]   Alvira Philips 08/23/2022, 1:21 PM

## 2022-08-23 NOTE — Inpatient Diabetes Management (Signed)
Inpatient Diabetes Program Recommendations  AACE/ADA: New Consensus Statement on Inpatient Glycemic Control   Target Ranges:  Prepandial:   less than 140 mg/dL      Peak postprandial:   less than 180 mg/dL (1-2 hours)      Critically ill patients:  140 - 180 mg/dL    Latest Reference Range & Units 08/22/22 08:39 08/22/22 15:01 08/22/22 21:25 08/23/22 08:11  Glucose-Capillary 70 - 99 mg/dL 332 (H) 293 (H) 184 (H) 271 (H)   Review of Glycemic Control  Diabetes history: DM2 Outpatient Diabetes medications: Trulicity 3 mg Qweek, Synjardy 12.02-999 mg BID, Actos 30 mg daily Current orders for Inpatient glycemic control: Novolog 0-15 units TID with meals, Novolog 0-5 units QHS, Novolog 4 units TID with meals, Metformin 1000 mg BID, Jardiance 25 mg daily; Prednisone 40 mg QAM  Inpatient Diabetes Program Recommendations:    Insulin: Please consider ordering Semglee 9 units Q24H.  Thanks, Barnie Alderman, RN, MSN, [place] Diabetes Coordinator Inpatient Diabetes Program [phone] (Team Pager from 8am to [place])

## 2022-08-23 NOTE — TOC Benefit Eligibility Note (Signed)
Patient Teacher, English as a foreign language completed.    The patient is currently admitted and upon discharge could be taking varenicline (Chantix) 1 mg tablet.  The current 30 day co-pay is $0.00.   The patient is insured through [place], [place] Patient Advocate Specialist [place] Patient Advocate Team Direct Number: [phone]  Fax: [phone]

## 2022-08-23 NOTE — Progress Notes (Signed)
Lab called reporting am labs have hemolyzed and will need to be recollected.

## 2022-08-24 ENCOUNTER — Telehealth: Payer: Self-pay

## 2022-08-24 NOTE — Telephone Encounter (Signed)
Patient called he is returning your phone call. Please call patient back.

## 2022-08-24 NOTE — Patient Outreach (Signed)
  Care Coordination [facility] Note Transition Care Management Unsuccessful Follow-up Telephone Call  Date of discharge and from where:  [facility]  Attempts:  1st Attempt  Reason for unsuccessful TCM follow-up call:  No answer/busy  Johnney Killian, RN, BSN, CCM Care Management Coordinator [facility] Phone: [phone]: [phone]

## 2022-08-27 ENCOUNTER — Telehealth: Payer: Self-pay

## 2022-08-27 LAB — CULTURE, BLOOD (ROUTINE X 2)
Culture: NO GROWTH
Culture: NO GROWTH
Special Requests: ADEQUATE

## 2022-08-27 NOTE — Patient Outreach (Signed)
  Care Coordination TOC Note Transition Care Management Follow-up Telephone Call Date of discharge and from where: [facility] 08[facility]22 How have you been since you were released from the hospital? "I have my good and bad days.  I am trying to get my assignments to my professor as my grade dropped when I was in the hospital" Any questions or concerns? No  Items Reviewed: Did the pt receive and understand the discharge instructions provided? Yes  Medications obtained and verified? Yes  Other? No  Any new allergies since your discharge? No  Dietary orders reviewed? Yes Do you have support at home? No   Home Care and Equipment/Supplies: Were home health services ordered? no If so, what is the name of the agency? No  Has the agency set up a time to come to the patient's home? not applicable Were any new equipment or medical supplies ordered?  No What is the name of the medical supply agency? I Were you able to get the supplies/equipment? no Do you have any questions related to the use of the equipment or supplies? No  Functional Questionnaire: (I = Independent and D = Dependent) ADLs: I  Bathing/Dressing- I  Meal Prep- I  Eating- I  Maintaining continence- I  Transferring/Ambulation- I  Managing Meds- I  Follow up appointments reviewed:  PCP Hospital f/u appt confirmed? Yes  Scheduled to see Dr. Sanjuana Mae on 09/04/22 @ 0915. [facility] f/u appt confirmed? Yes  Scheduled to see Dr. Lake Bells on 09/06/22 @ 10:30. Are transportation arrangements needed? No  If their condition worsens, is the pt aware to call PCP or go to the Emergency Dept.? Yes Was the patient provided with contact information for the PCP's office or ED? Yes Was to pt encouraged to call back with questions or concerns? Yes  SDOH assessments and interventions completed:   Yes  Care Coordination Interventions Activated:  Yes   Care Coordination Interventions:  No Care Coordination interventions needed  at this time.   Encounter Outcome:  Pt. Visit Completed

## 2022-09-04 ENCOUNTER — Ambulatory Visit (INDEPENDENT_AMBULATORY_CARE_PROVIDER_SITE_OTHER): Payer: Medicare Other | Admitting: Student

## 2022-09-04 ENCOUNTER — Encounter: Payer: Self-pay | Admitting: Student

## 2022-09-04 ENCOUNTER — Other Ambulatory Visit: Payer: Self-pay

## 2022-09-04 VITALS — BP 111/65 | HR 92 | Temp 98.0°F | Ht 75.0 in | Wt 218.0 lb

## 2022-09-04 DIAGNOSIS — Z7984 Long term (current) use of oral hypoglycemic drugs: Secondary | ICD-10-CM

## 2022-09-04 DIAGNOSIS — F419 Anxiety disorder, unspecified: Secondary | ICD-10-CM | POA: Insufficient documentation

## 2022-09-04 DIAGNOSIS — J441 Chronic obstructive pulmonary disease with (acute) exacerbation: Secondary | ICD-10-CM

## 2022-09-04 DIAGNOSIS — E1151 Type 2 diabetes mellitus with diabetic peripheral angiopathy without gangrene: Secondary | ICD-10-CM | POA: Diagnosis not present

## 2022-09-04 DIAGNOSIS — Z87891 Personal history of nicotine dependence: Secondary | ICD-10-CM

## 2022-09-04 DIAGNOSIS — F411 Generalized anxiety disorder: Secondary | ICD-10-CM | POA: Diagnosis not present

## 2022-09-04 MED ORDER — SPIRIVA RESPIMAT 2.5 MCG/ACT IN AERS: 1.0000 | INHALATION_SPRAY | Freq: Two times a day (BID) | RESPIRATORY_TRACT | 3 refills | Status: DC

## 2022-09-04 MED ORDER — VARENICLINE TARTRATE 0.5 MG PO TABS: 0.5000 mg | ORAL_TABLET | Freq: Two times a day (BID) | ORAL | 3 refills | Status: DC

## 2022-09-04 MED ORDER — DULERA 100-5 MCG/ACT IN AERO: 2.0000 | INHALATION_SPRAY | Freq: Every day | RESPIRATORY_TRACT | 3 refills | Status: DC

## 2022-09-04 NOTE — Telephone Encounter (Signed)
Please call patient when rx has been sent

## 2022-09-04 NOTE — Assessment & Plan Note (Addendum)
Pt's last A1c was 8.6 two months ago. Glucose reading this morning was 165. Current regimen includes Trulicity '3mg'$ , Synjardy 12.5-'1000mg'$ , and Pioglitazone. He has not had any hypoglycemic readings or events. Will need A1c checked next month, and evaluation of diabetic regimen if not improving.   Plan:  - Continue medical regimen

## 2022-09-04 NOTE — Patient Instructions (Signed)
Thank you so much for coming to the clinic today! Today, we are changing your inhaler from the Trelegy to two new ones called Dulera and Spiriva. Remember to take the Spiriva twice a day, I have sent both of them to your pharmacy. I have also sent in a referral to be seen by a psychiatrist, they will reach out to you soon. I've also sent Chantix to your pharmacy so the transition to quitting smoking will be a little smoother. Hope everything goes well at your lung doctor appointment! Your next appointment we'll discuss your diabetes.   If you have any questions please feel free to the call the clinic at anytime at [phone]. It was a pleasure seeing you!  Best, Dr. Sanjuana Mae

## 2022-09-04 NOTE — Assessment & Plan Note (Signed)
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 Anxiety has also played a part in his COPD/Asthma exacerbations that have led to two hospitalization in the last month or so. He has never previously sought out professional help for his anxiety, but is now amenable as a means to help calm him down.   Plan:  - Ambulatory psychiatry referral

## 2022-09-04 NOTE — Assessment & Plan Note (Signed)
Pt presents for a hospital follow up after his COPD exacerbation. He states he is doing much better. He stopped smoking cold [place], and has been irritable because of it, as well as heightening anxiety. He finished his prednisone course as well. He denies any significant stress when it comes to his breathing, and has been well controlled with his Trelegy and Albuterol. He has an appointment set with pulmonology on November 30th. He does complain that everytime he does use his Trellegy he gets a slight headache for five seconds that then goes away.   Plan:  - Due to side effect of headache, will switch from Trelegy to [facility] and Spiriva inhaler - Will start chantix to help transition process from quitting smoking

## 2022-09-04 NOTE — Progress Notes (Signed)
CC: Hospital follow up  HPI:  Richard [name] is a 61 y.o. male living with a history stated below and presents today for hospital follow up for likely . Please see problem based assessment and plan for additional details.  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 6.7 g 2   ascorbic acid (VITAMIN C) 500 MG tablet Take 500 mg by mouth daily.     aspirin EC 81 MG EC tablet Take 1 tablet (81 mg total) by mouth daily. 30 tablet 3   atorvastatin (LIPITOR) 40 MG tablet Take 1 tablet (40 mg total) by mouth daily. 90 tablet 3   azithromycin (ZITHROMAX) 250 MG tablet Take 1 tablet (250 mg total) by mouth daily. 1 tablet 0   Dulaglutide (TRULICITY) 3 QA/8.3MH SOPN Inject 3 mg as directed once a week. (Patient taking differently: Inject 3 mg as directed once a week. Sunday) 2 mL 11   Empagliflozin-metFORMIN HCl (SYNJARDY) 12.02-999 MG TABS Take 1 tablet by mouth 2 (two) times daily. 180  tablet 3   fluticasone (FLONASE) 50 MCG/ACT nasal spray Place 1 spray into both nostrils daily. 16 g 0   glucose blood (ACCU-CHEK GUIDE) test strip USE TO CHECK BLOOD SUGARS TWICE DAILY 200 strip 5   ipratropium-albuterol (DUONEB) 0.5-2.5 (3) MG/3ML SOLN Take 3 mLs by nebulization every 6 (six) hours as needed (for wheezing not relieved by albuterol inhaler). 360 mL 3   montelukast (SINGULAIR) 10 MG tablet Take 1 tablet (10 mg total) by mouth at bedtime. 90 tablet 3   Multiple Vitamins-Minerals (MULTI FOR HIM 50+ PO) Take 1 tablet by mouth daily.     nitroGLYCERIN (NITRODUR - DOSED IN MG/24 HR) 0.2 mg/hr patch Place 1 patch (0.2 mg total) onto the skin daily. (Patient not taking: Reported on 08/22/2022) 30 patch 12   pioglitazone (ACTOS) 30 MG tablet Take 1 tablet (30 mg total) by mouth daily. 90 tablet 3   predniSONE (DELTASONE) 20 MG tablet Take 2 tablets (40 mg total) by mouth daily with breakfast. 6 tablet 0   pregabalin (LYRICA) 100 MG capsule Take 1 capsule (100 mg total) by mouth 3 (three) times daily. 90 capsule 2   No current facility-administered medications on file prior to visit.    Family History  Problem Relation Age of Onset   Hypertension  Mother    Hypertension Sister    Glaucoma Sister    Colon cancer Neg Hx    Colon polyps Neg Hx    Esophageal cancer Neg Hx    Rectal cancer Neg Hx    Stomach cancer Neg Hx     Social History   Socioeconomic History   Marital status: Single    Spouse name: Not on file   Number of children: Not on file   Years of education: Not on file   Highest education level: Not on file  Occupational History   Not on file  Tobacco Use   Smoking status: Former    Packs/day: 0.50    Years: 45.00    Total pack years: 22.50    Types: Cigarettes    Quit date: 01/12/2018    Years since quitting: 4.6   Smokeless tobacco: Never  Vaping Use   Vaping Use: Never used  Substance and Sexual Activity   Alcohol use: Not Currently    Alcohol/week:  0.0 standard drinks of alcohol    Comment: 08/24/2019 "nothing since <2010"   Drug use: Not Currently    Types: Cocaine    Comment: 10/23/2018 "nothing since <1990"   Sexual activity: Not on file  Other Topics Concern   Not on file  Social History Narrative   Currently working on obtaining GED from [employer]- July 2018   Social Determinants of Health   Financial Resource Strain: Not on file  Food Insecurity: No Food Insecurity (09/04/2022)   Hunger Vital Sign    Worried About Running Out of Food in the Last Year: Never true    Ran Out of Food in the Last Year: Never true  Transportation Needs: No Transportation Needs (09/04/2022)   PRAPARE - Hydrologist (Medical): No    Lack of Transportation (Non-Medical): No  Physical Activity: Not on file  Stress: Not on file  Social Connections: Moderately Isolated (09/04/2022)   Social Connection and Isolation Panel [NHANES]    Frequency of Communication with Friends and Family: More than three times a week    Frequency of Social Gatherings with Friends and Family: More than three times a week    Attends Religious Services: More than 4 times per year    Active Member of [employer] or Organizations: No    Attends Archivist Meetings: Never    Marital Status: Divorced  Human resources officer Violence: Not At Risk (09/04/2022)   Humiliation, Afraid, Rape, and Kick questionnaire    Fear of Current or Ex-Partner: No    Emotionally Abused: No    Physically Abused: No    Sexually Abused: No    Review of Systems: ROS negative except for what is noted on the assessment and plan.  Vitals:   09/04/22 0842  BP: 111/65  Pulse: 92  Temp: 98 F (36.7 C)  TempSrc: Oral  SpO2: 100%  Weight: 218 lb (98.9 kg)  Height: '6\' 3"'$  (1.905 m)    Physical Exam: Constitutional: well-appearing male, in no acute distress Cardiovascular: regular rate and rhythm, no m/r/g Pulmonary/Chest: normal work of breathing on room air, mild  wheezes heard on aucultation in upper lobes  Assessment & Plan:   COPD with acute exacerbation ([place]) Pt presents for a hospital follow up after his COPD exacerbation. He states he is doing much better. He stopped smoking cold [place], and has been irritable because of it, as well as heightening anxiety. He finished his prednisone course as well.  He denies any significant stress when it comes to his breathing, and has been well controlled with his Trelegy and Albuterol. He has an appointment set with pulmonology on November 30th. He does complain that everytime he does use his Trellegy he gets a slight headache for five seconds that then goes away.   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 Anxiety has also played a part in his COPD/Asthma exacerbations that have led to two hospitalization in the last month or so. He has never previously sought out professional help for his anxiety, but is now amenable as a means to help calm him down.   Plan:  - Ambulatory psychiatry referral  Controlled type 2 diabetes mellitus with diabetic peripheral angiopathy without gangrene, without long-term current use of insulin (HCC) Pt's last A1c was 8.6 two months ago. Glucose reading this morning was 165. Current regimen includes Trulicity '3mg'$ , Synjardy 12.5-'1000mg'$ , and Pioglitazone. He has not had any hypoglycemic readings or events. Will need A1c checked next month, and evaluation of diabetic regimen if not improving.   Plan:  - Continue medical regimen  Patient seen with Dr. Thomasene Ripple, M.D. [facility], PGY-1 Date 09/04/2022 Time 1:48 PM

## 2022-09-05 ENCOUNTER — Other Ambulatory Visit: Payer: Self-pay | Admitting: Internal Medicine

## 2022-09-05 MED ORDER — NICOTINE 14 MG/24HR TD PT24: 14.0000 mg | MEDICATED_PATCH | TRANSDERMAL | 0 refills | Status: DC

## 2022-09-05 MED ORDER — NICOTINE 7 MG/24HR TD PT24: 7.0000 mg | MEDICATED_PATCH | Freq: Every day | TRANSDERMAL | 0 refills | Status: DC

## 2022-09-05 NOTE — Telephone Encounter (Signed)
Patient called in requesting refill on Chantix and nicotine patches. Patches are no longer on current med list. Patient is heading to Pharmacy now to p/u Rxs sent yesterday. He'd like to p/u all meds at same time.

## 2022-09-05 NOTE — Telephone Encounter (Signed)
Pt seen yesterday 09/04/2022 (Nooruddin) and is f/u with the following refill. Pt would like a call back if possible.   varenicline (CHANTIX) 0.5 MG tablet   [id number] - [place], [place] - [address] AT [facility]

## 2022-09-06 ENCOUNTER — Institutional Professional Consult (permissible substitution): Payer: Medicare Other | Admitting: Pulmonary Disease
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
## 2022-09-07 ENCOUNTER — Ambulatory Visit (INDEPENDENT_AMBULATORY_CARE_PROVIDER_SITE_OTHER): Payer: Medicare Other | Admitting: Student

## 2022-09-07 ENCOUNTER — Encounter: Payer: Self-pay | Admitting: Student

## 2022-09-07 VITALS — BP 124/68 | HR 87 | Temp 97.8°F | Ht 75.0 in | Wt 217.0 lb

## 2022-09-07 DIAGNOSIS — J8283 Eosinophilic asthma: Secondary | ICD-10-CM

## 2022-09-07 DIAGNOSIS — J4489 Other specified chronic obstructive pulmonary disease: Secondary | ICD-10-CM | POA: Diagnosis not present

## 2022-09-07 DIAGNOSIS — J45998 Other asthma: Secondary | ICD-10-CM | POA: Diagnosis not present

## 2022-09-07 MED ORDER — BREZTRI AEROSPHERE 160-9-4.8 MCG/ACT IN AERO: 2.0000 | INHALATION_SPRAY | Freq: Two times a day (BID) | RESPIRATORY_TRACT | 11 refills | Status: DC

## 2022-09-07 MED ORDER — BREZTRI AEROSPHERE 160-9-4.8 MCG/ACT IN AERO: 2.0000 | INHALATION_SPRAY | Freq: Two times a day (BID) | RESPIRATORY_TRACT | 0 refills | Status: DC

## 2022-09-07 NOTE — Patient Instructions (Signed)
-   try breztri 2 puffs twice daily rinse mouth out after use - stop spiriva when you're taking breztri - albuterol 1-2 puffs as needed - let's see how you do while you're not taking singulair - see you in 3 months or sooner if need be - you appear to have eosinophilic asthma - if inhalers don't prevent attacks of asthma requiring treatment with steroids then we may need to consider medications like dupixent, nucala, fasenra to treat severe persistent, eosinophilic asthma

## 2022-09-10 MED ORDER — NITROGLYCERIN 0.2 MG/HR TD PT24: 0.2000 mg | MEDICATED_PATCH | Freq: Every day | TRANSDERMAL | 12 refills | Status: DC

## 2022-09-10 NOTE — Progress Notes (Addendum)
Internal Medicine Clinic Attending  I saw and evaluated the patient.  I personally confirmed the key portions of the history and exam and I reviewed pertinent patient test results.  The assessment, diagnosis, and plan were formulated together and I agree with the documentation in the resident's note.

## 2022-09-20 ENCOUNTER — Ambulatory Visit: Payer: Self-pay | Admitting: Licensed Clinical Social Worker

## 2022-09-20 NOTE — Patient Outreach (Signed)
SW removed self from care team.   Temima Kutsch, BSW, MSW, LCSW-A  Social Worker [facility] Care Management  [phone] 

## 2022-09-27 ENCOUNTER — Other Ambulatory Visit: Payer: Self-pay

## 2022-09-27 ENCOUNTER — Encounter: Payer: Self-pay | Admitting: Internal Medicine

## 2022-09-27 ENCOUNTER — Ambulatory Visit (INDEPENDENT_AMBULATORY_CARE_PROVIDER_SITE_OTHER): Payer: Medicare Other | Admitting: Internal Medicine

## 2022-09-27 VITALS — BP 106/65 | HR 95 | Temp 98.2°F | Ht 74.0 in | Wt 218.6 lb

## 2022-09-27 DIAGNOSIS — E1151 Type 2 diabetes mellitus with diabetic peripheral angiopathy without gangrene: Secondary | ICD-10-CM

## 2022-09-27 DIAGNOSIS — J4489 Other specified chronic obstructive pulmonary disease: Secondary | ICD-10-CM | POA: Diagnosis not present

## 2022-09-27 DIAGNOSIS — Z0000 Encounter for general adult medical examination without abnormal findings: Secondary | ICD-10-CM | POA: Diagnosis not present

## 2022-09-27 DIAGNOSIS — F17201 Nicotine dependence, unspecified, in remission: Secondary | ICD-10-CM

## 2022-09-27 DIAGNOSIS — F17211 Nicotine dependence, cigarettes, in remission: Secondary | ICD-10-CM

## 2022-09-27 LAB — POCT GLYCOSYLATED HEMOGLOBIN (HGB A1C): Hemoglobin A1C: 10.4 % — AB (ref 4.0–5.6)

## 2022-09-27 LAB — GLUCOSE, CAPILLARY: Glucose-Capillary: 171 mg/dL — ABNORMAL HIGH (ref 70–99)

## 2022-09-27 NOTE — Assessment & Plan Note (Signed)
Diabetic control has worsened did have multiple recent steroid courses for COPD exacerbations.  Also admits occasional missed dose.  He has been taking Trulicity every week.  He reaffirms goal A1c below 7 and wants to do so without additional medications today.  Given the recent steroid use and suboptimal adherence I think this is reasonable he will reach out before the next visit if his sugars remain elevated.  I also discussed referral back to [facility] for additional nutrition counseling.

## 2022-09-27 NOTE — Assessment & Plan Note (Signed)
Last absolute eosinhil count good, saw pulm last month now on breztri.  Credits stopping smoking with feeling pretty good today.

## 2022-09-27 NOTE — Assessment & Plan Note (Signed)
Updated HC maintenance.  Due to eye exam. Considering covid booster and shingrix.

## 2022-09-27 NOTE — Patient Instructions (Signed)
  Richard [name] , Thank you for taking time to come for your Medicare Wellness Visit. I appreciate your ongoing commitment to your health goals. Please review the following plan we discussed and let me know if I can assist you in the future. I want you to go get your shingles vaccine, its called Shingrix.  These are the goals we discussed:  Goals      Blood Pressure < 140/90     HEMOGLOBIN A1C < 7.0     LDL CALC < 100        This is a list of the screening recommended for you and due dates:  Health Maintenance  Topic Date Due   Medicare Annual Wellness Visit  Never done   Zoster (Shingles) Vaccine (1 of 2) Never done   COVID-19 Vaccine (3 - 2023-24 season) 06/08/2022   Eye exam for diabetics  08/01/2022   Yearly kidney health urinalysis for diabetes  12/15/2022   Complete foot exam   12/15/2022   Hemoglobin A1C  12/27/2022   Colon Cancer Screening  01/24/2023   Screening for Lung Cancer  08/23/2023   Yearly kidney function blood test for diabetes  08/24/2023   DTaP/Tdap/Td vaccine (2 - Td or Tdap) 09/19/2028   Flu Shot  Completed   Hepatitis C Screening: USPSTF Recommendation to screen - Ages 18-79 yo.  Completed   HIV Screening  Completed   HPV Vaccine  Aged Out

## 2022-09-27 NOTE — Progress Notes (Signed)
Annual Wellness Visit     Patient: Richard [name], Male    DOB: 12-Feb-1961, 61 y.o.   MRN: [id number]  Subjective  Chief Complaint  Patient presents with   Follow-up    Richard [name] is a 61 y.o. male who presents today for his Annual Wellness Visit. He reports consuming a diabetic diet. The patient does not participate in regular exercise at present. He generally feels fairly well. He reports sleeping well. He does have additional problems to discuss today. Wishes to discuss diabetes. Has had a few courses of steroids, breathing is much better but sugars have been higher. Occasionally forgetting dose of pioglitazone or synjardy.  Not in class right now, less active.   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 ([place]) 08/03/2020   Impingement syndrome of right shoulder 01/29/2020   Lumbar strain 11/28/2018   Mediastinal mass 08/25/2018   Cerumen impaction 08/21/2018   Osteoarthritis of left knee 01/09/2018   Peripheral arterial disease ([place]) 10/21/2017   Erectile dysfunction 03/06/2017   Abdominal aortic atherosclerosis ([place]) 01/18/2017   Vitamin D deficiency 07/02/2016   Tubular adenoma of colon 02/06/2016   Diabetic neuropathy, painful ([place]) 11/30/2015   Overweight (BMI 25.0-29.9) 11/30/2015   CAD in native artery 10/20/2015   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    Essential hypertension    Controlled type 2 diabetes mellitus with diabetic  peripheral angiopathy without gangrene, without long-term current use of insulin (HCC)    Asthma-COPD overlap syndrome    Past Medical History:  Diagnosis Date   Asthma    Chest pain 06/28/2016   Chronic bronchitis ([place])    Clostridium difficile colitis 09/09/2014   Constipation 09/26/2018   COPD (chronic obstructive pulmonary disease) ([place])    Eczema    Essential hypertension    Glaucoma, bilateral    surgery on left eye but not right   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)    diagnosed in 2013      Medications: Outpatient Medications Prior to Visit  Medication Sig   nicotine (NICODERM CQ - DOSED IN MG/24 HOURS) 14 mg/24hr patch Place 1 patch (14 mg total) onto the skin daily. (Patient not taking: Reported on 09/07/2022)   nicotine (NICODERM CQ - DOSED IN MG/24 HR) 7 mg/24hr patch Place 1 patch (7 mg total) onto the skin daily. (Patient not taking: Reported on 09/07/2022)   Accu-Chek FastClix Lancets MISC Check blood sugar two times a day   albuterol (VENTOLIN HFA) 108 (90 Base) MCG/ACT inhaler Inhale 1-2 puffs into the lungs every 4 (  four) hours as needed for wheezing or shortness of breath.   ascorbic acid (VITAMIN C) 500 MG tablet Take 500 mg by mouth daily.   aspirin EC 81 MG EC tablet Take 1 tablet (81 mg total) by mouth daily.   atorvastatin (LIPITOR) 40 MG tablet Take 1 tablet (40 mg total) by mouth daily.   Budeson-Glycopyrrol-Formoterol (BREZTRI AEROSPHERE) 160-9-4.8 MCG/ACT AERO Inhale 2 puffs into the lungs in the morning and at bedtime.   Budeson-Glycopyrrol-Formoterol (BREZTRI AEROSPHERE) 160-9-4.8 MCG/ACT AERO Inhale 2  puffs into the lungs in the morning and at bedtime.   Dulaglutide (TRULICITY) 3 OF/7.5ZW SOPN Inject 3 mg as directed once a week. (Patient taking differently: Inject 3 mg as directed once a week. Sunday)   Empagliflozin-metFORMIN HCl (SYNJARDY) 12.02-999 MG TABS Take 1 tablet by mouth 2 (two) times daily.   fluticasone (FLONASE) 50 MCG/ACT nasal spray Place 1 spray into both nostrils daily.   glucose blood (ACCU-CHEK GUIDE) test strip USE TO CHECK BLOOD SUGARS TWICE DAILY   ipratropium-albuterol (DUONEB) 0.5-2.5 (3) MG/3ML SOLN Take 3 mLs by nebulization every 6 (six) hours as needed (for wheezing not relieved by albuterol inhaler).   montelukast (SINGULAIR) 10 MG tablet Take 1 tablet (10 mg total) by mouth at bedtime.   Multiple Vitamins-Minerals (MULTI FOR HIM 50+ PO) Take 1 tablet by mouth daily.   nitroGLYCERIN (NITRODUR - DOSED IN MG/24 HR) 0.2 mg/hr patch Place 1 patch (0.2 mg total) onto the skin daily.   pioglitazone (ACTOS) 30 MG tablet Take 1 tablet (30 mg total) by mouth daily.   pregabalin (LYRICA) 100 MG capsule Take 1 capsule (100 mg total) by mouth 3 (three) times daily.   varenicline (CHANTIX) 0.5 MG tablet Take 1 tablet (0.5 mg total) by mouth 2 (two) times daily. (Patient not taking: Reported on 09/07/2022)   No facility-administered medications prior to visit.    Allergies  Allergen Reactions   Shellfish Allergy Anaphylaxis and Shortness Of Breath    Patient Care Team: [name], DO as PCP - General (Internal Medicine) [name], MD as PCP - Cardiology (Cardiology) Despina Hick, MD as Consulting Physician (Ophthalmology) Kennith Gain, MD as Consulting Physician (Allergy) Maryjane Hurter, MD as Consulting Physician (Pulmonary Disease) Edrick Kins, DPM as Consulting Physician (Podiatry)     Objective  BP 106/65 (BP Location: Right Arm, Patient Position: Sitting, Cuff Size: Large)   Pulse 95   Temp 98.2 F (36.8 C) (Oral)   Ht 6'  2" (1.88 m)   Wt 218 lb 9.6 oz (99.2 kg)   SpO2 100% Comment: RA  BMI 28.07 kg/m  BP Readings from Last 3 Encounters:  09/27/22 106/65  09/07/22 124/68  09/04/22 111/65   Wt Readings from Last 3 Encounters:  09/27/22 218 lb 9.6 oz (99.2 kg)  09/07/22 217 lb (98.4 kg)  09/04/22 218 lb (98.9 kg)      Physical Exam Vitals and nursing note reviewed.  Constitutional:      Appearance: Normal appearance.  Cardiovascular:     Rate and Rhythm: Normal rate and regular rhythm.  Pulmonary:     Effort: Pulmonary effort is normal.     Breath sounds: No wheezing.  Neurological:     Mental Status: He is alert.  Psychiatric:        Mood and Affect: Mood normal.       Most recent functional status assessment:    09/27/2022    8:36 AM  In your present state  of health, do you have any difficulty performing the following activities:  Hearing? 0  Vision? 0  Difficulty concentrating or making decisions? 0  Walking or climbing stairs? 1  Dressing or bathing? 0  Doing errands, shopping? 0   Most recent fall risk assessment:    09/27/2022    8:35 AM  Fall Risk   Falls in the past year? 0  Number falls in past yr: 0  Injury with Fall? 0  Risk for fall due to : No Fall Risks  Follow up Falls evaluation completed    Most recent depression screenings:    09/27/2022    8:36 AM 09/04/2022    8:44 AM  PHQ 2/9 Scores  PHQ - 2 Score 0 0  PHQ- 9 Score  1   Most recent cognitive screening:     No data to display         Most recent Audit-C alcohol use screening    09/27/2022    1:20 PM  Alcohol Use Disorder Test (AUDIT)  1. How often do you have a drink containing alcohol? 0  2. How many drinks containing alcohol do you have on a typical day when you are drinking? 0  3. How often do you have six or more drinks on one occasion? 0  AUDIT-C Score 0   A score of 3 or more in women, and 4 or more in men indicates increased risk for alcohol abuse, EXCEPT if all of the points  are from question 1   Vision/Hearing Screen: No results found.  Last CBC Lab Results  Component Value Date   WBC 7.8 08/23/2022   HGB 11.7 (L) 08/23/2022   HCT 36.6 (L) 08/23/2022   MCV 84.3 08/23/2022   MCH 27.0 08/23/2022   RDW 14.5 08/23/2022   PLT 235 35/70/1779   Last metabolic panel Lab Results  Component Value Date   GLUCOSE 202 (H) 08/23/2022   NA 134 (L) 08/23/2022   K 4.0 08/23/2022   CL 104 08/23/2022   CO2 22 08/23/2022   BUN 18 08/23/2022   CREATININE 1.05 08/23/2022   GFRNONAA >60 08/23/2022   CALCIUM 8.7 (L) 08/23/2022   PHOS 4.8 (H) 08/22/2022   PROT 6.9 08/22/2022   ALBUMIN 3.6 08/22/2022   LABGLOB 2.5 08/17/2021   AGRATIO 1.7 08/17/2021   BILITOT 0.6 08/22/2022   ALKPHOS 92 08/22/2022   AST 25 08/22/2022   ALT 20 08/22/2022   ANIONGAP 8 08/23/2022   Last lipids Lab Results  Component Value Date   CHOL 225 (H) 07/18/2022   HDL 74 07/18/2022   LDLCALC 148 (H) 07/18/2022   TRIG 14 07/18/2022   CHOLHDL 3.0 07/18/2022   Last hemoglobin A1c Lab Results  Component Value Date   HGBA1C 10.4 (A) 09/27/2022      Results for orders placed or performed in visit on 09/27/22  Glucose, capillary  Result Value Ref Range   Glucose-Capillary 171 (H) 70 - 99 mg/dL  POC Hbg A1C  Result Value Ref Range   Hemoglobin A1C 10.4 (A) 4.0 - 5.6 %   HbA1c POC (<> result, manual entry)     HbA1c, POC (prediabetic range)     HbA1c, POC (controlled diabetic range)        Assessment & Plan   Annual wellness visit done today including the all of the following: Reviewed patient's Family Medical History Reviewed and updated list of patient's medical providers Assessment of cognitive impairment was done Assessed patient's functional ability Established  a written schedule for health screening services Health Risk Assessent Completed and Reviewed  Exercise Activities and Dietary recommendations  Goals      Blood Pressure < 140/90     HEMOGLOBIN A1C < 7.0      LDL CALC < 100        Immunization History  Administered Date(s) Administered   Influenza,inj,Quad PF,6+ Mos 07/07/2018, 08/06/2019, 08/17/2021   Influenza-Unspecified 05/09/2020, 07/08/2022   PFIZER(Purple Top)SARS-COV-2 Vaccination 07/14/2020, 09/19/2020   PPD Test 07/12/2017   Pneumococcal Polysaccharide-23 10/24/2018   Tdap 09/19/2018    Health Maintenance  Topic Date Due   Zoster Vaccines- Shingrix (1 of 2) Never done   COVID-19 Vaccine (3 - 2023-24 season) 06/08/2022   OPHTHALMOLOGY EXAM  08/01/2022   Diabetic kidney evaluation - Urine ACR  12/15/2022   FOOT EXAM  12/15/2022   HEMOGLOBIN A1C  12/27/2022   COLONOSCOPY (Pts 45-52yr Insurance coverage will need to be confirmed)  01/24/2023   Lung Cancer Screening  08/23/2023   Diabetic kidney evaluation - eGFR measurement  08/24/2023   Medicare Annual Wellness (AWV)  09/28/2023   DTaP/Tdap/Td (2 - Td or Tdap) 09/19/2028   INFLUENZA VACCINE  Completed   Hepatitis C Screening  Completed   HIV Screening  Completed   HPV VACCINES  Aged Out     Discussed health benefits of physical activity, and encouraged him to engage in regular exercise appropriate for his age and condition.    Problem List Items Addressed This Visit       Cardiovascular and Mediastinum   Controlled type 2 diabetes mellitus with diabetic peripheral angiopathy without gangrene, without long-term current use of insulin (HCC) (Chronic)    Diabetic control has worsened did have multiple recent steroid courses for COPD exacerbations.  Also admits occasional missed dose.  He has been taking Trulicity every week.  He reaffirms goal A1c below 7 and wants to do so without additional medications today.  Given the recent steroid use and suboptimal adherence I think this is reasonable he will reach out before the next visit if his sugars remain elevated.  I also discussed referral back to [facility] Pennyfor additional nutrition counseling.      Relevant Orders   POC  Hbg A1C (Completed)   Referral to Nutrition and Diabetes Services     Respiratory   Asthma-COPD overlap syndrome (Chronic)    Last absolute eosinhil count good, saw pulm last month now on breztri.  Credits stopping smoking with feeling pretty good today.        Other   Routine adult health maintenance - Primary    Updated HC maintenance.  Due to eye exam. Considering covid booster and shingrix.      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  Family has been supportive.       Return in about 3 months (around 12/27/2022).     ELucious Groves DO

## 2022-09-27 NOTE — Assessment & Plan Note (Signed)
On chantix, going well.  Family has been supportive.

## 2022-10-05 ENCOUNTER — Ambulatory Visit (INDEPENDENT_AMBULATORY_CARE_PROVIDER_SITE_OTHER): Payer: Medicare Other | Admitting: Podiatry

## 2022-10-05 DIAGNOSIS — E1140 Type 2 diabetes mellitus with diabetic neuropathy, unspecified: Secondary | ICD-10-CM | POA: Diagnosis not present

## 2022-10-05 DIAGNOSIS — I999 Unspecified disorder of circulatory system: Secondary | ICD-10-CM

## 2022-10-05 DIAGNOSIS — I739 Peripheral vascular disease, unspecified: Secondary | ICD-10-CM

## 2022-10-05 DIAGNOSIS — L97512 Non-pressure chronic ulcer of other part of right foot with fat layer exposed: Secondary | ICD-10-CM | POA: Diagnosis not present

## 2022-10-05 MED ORDER — DOXYCYCLINE HYCLATE 100 MG PO TABS: 100.0000 mg | ORAL_TABLET | Freq: Two times a day (BID) | ORAL | 0 refills | Status: DC

## 2022-10-05 NOTE — Progress Notes (Signed)
Subjective:  Patient ID: Richard [name], male    DOB: [birth year],  MRN: [id number]  Chief Complaint  Patient presents with   Nail Problem     Routine foot care / callus trim     61 y.o. male presents for wound care.  Patient presents with right submetatarsal 2 ulceration.  Patient is taking out of nowhere and has progressive gotten worse.  Hurts with ambulation worse with pressure is like he is a diabetic with last A1c of 10.4%.  He states his A1c has started climbing up.  He noticed some blood in the socks and wanted to get it evaluated denies any other acute issues or some pain with ambulation.  No nausea fever chills vomiting   Review of Systems: Negative except as noted in the HPI. Denies N/V/F/Ch.  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 (Patient not taking: Reported on 09/07/2022), Disp: 14 patch, Rfl: 0   nicotine (NICODERM CQ - DOSED IN MG/24 HR) 7 mg/24hr patch, Place 1 patch (7 mg total) onto the skin daily. (Patient  not taking: Reported on 09/07/2022), Disp: 21 patch, Rfl: 0   Accu-Chek FastClix Lancets MISC, Check blood sugar two times a day, Disp: 204 each, Rfl: 5   albuterol (VENTOLIN HFA) 108 (90 Base) MCG/ACT inhaler, Inhale 1-2 puffs into the lungs every 4 (four) hours as needed for wheezing or shortness of breath., Disp: 6.7 g, Rfl: 2   ascorbic acid (VITAMIN C) 500 MG tablet, Take 500 mg by mouth daily., Disp: , Rfl:    aspirin EC 81 MG EC tablet, Take 1 tablet (81 mg total) by mouth daily., Disp: 30 tablet, Rfl: 3   atorvastatin (LIPITOR) 40 MG tablet, Take 1 tablet (40 mg total) by mouth daily., Disp: 90 tablet, Rfl: 3   Budeson-Glycopyrrol-Formoterol (BREZTRI AEROSPHERE) 160-9-4.8 MCG/ACT AERO, Inhale 2 puffs into the lungs in the morning and at bedtime., Disp: 5.9 g, Rfl: 0   Budeson-Glycopyrrol-Formoterol (BREZTRI AEROSPHERE) 160-9-4.8 MCG/ACT AERO, Inhale 2 puffs into the lungs in the morning and at bedtime., Disp: 1 each, Rfl: 11   Dulaglutide (TRULICITY) 3 EQ/6.8TM SOPN, Inject 3 mg as directed once a week. (Patient taking differently: Inject 3 mg as directed once a week. Sunday), Disp: 2 mL, Rfl: 11   Empagliflozin-metFORMIN HCl (SYNJARDY) 12.02-999 MG TABS, Take 1 tablet by mouth 2 (two) times daily.,  Disp: 180 tablet, Rfl: 3   fluticasone (FLONASE) 50 MCG/ACT nasal spray, Place 1 spray into both nostrils daily., Disp: 16 g, Rfl: 0   glucose blood (ACCU-CHEK GUIDE) test strip, USE TO CHECK BLOOD SUGARS TWICE DAILY, Disp: 200 strip, Rfl: 5   ipratropium-albuterol (DUONEB) 0.5-2.5 (3) MG/3ML SOLN, Take 3 mLs by nebulization every 6 (six) hours as needed (for wheezing not relieved by albuterol inhaler)., Disp: 360 mL, Rfl: 3   montelukast (SINGULAIR) 10 MG tablet, Take 1 tablet (10 mg total) by mouth at bedtime., Disp: 90 tablet, Rfl: 3   Multiple Vitamins-Minerals (MULTI FOR HIM 50+ PO), Take 1 tablet by mouth daily., Disp: , Rfl:    nitroGLYCERIN (NITRODUR - DOSED IN MG/24 HR) 0.2 mg/hr patch,  Place 1 patch (0.2 mg total) onto the skin daily., Disp: 30 patch, Rfl: 12   pioglitazone (ACTOS) 30 MG tablet, Take 1 tablet (30 mg total) by mouth daily., Disp: 90 tablet, Rfl: 3   pregabalin (LYRICA) 100 MG capsule, Take 1 capsule (100 mg total) by mouth 3 (three) times daily., Disp: 90 capsule, Rfl: 2   varenicline (CHANTIX) 0.5 MG tablet, Take 1 tablet (0.5 mg total) by mouth 2 (two) times daily. (Patient not taking: Reported on 09/07/2022), Disp: 90 tablet, Rfl: 3  Social History   Tobacco Use  Smoking Status Former   Packs/day: 1.00   Years: 45.00   Total pack years: 45.00   Types: Cigarettes   Quit date: 08/22/2022   Years since quitting: 0.1  Smokeless Tobacco Never    Allergies  Allergen Reactions   Shellfish Allergy Anaphylaxis and Shortness Of Breath   Objective:  There were no vitals filed for this visit. There is no height or weight on file to calculate BMI. Constitutional Well developed. Well nourished.  Vascular Dorsalis pedis pulses palpable bilaterally. Posterior tibial pulses palpable bilaterally. Capillary refill normal to all digits.  No cyanosis or clubbing noted. Pedal hair growth normal.  Neurologic Normal speech. Oriented to person, place, and time. Protective sensation absent  Dermatologic Wound Location: Right submetatarsal 2 ulceration with fat layer exposed. Wound Base: Mixed Granular/Fibrotic Peri-wound: Calloused Exudate: Scant/small amount Serosanguinous exudate Wound Measurements: -See below  Orthopedic: No pain to palpation either foot.   Radiographs: None Assessment:   1. Ulcer of right foot with fat layer exposed ([place])   2. Vascular abnormality   3. Peripheral arterial disease ([place])   4. Diabetic neuropathy, painful ([place])   5. Right foot ulcer, with fat layer exposed ([place])    Plan:  Patient was evaluated and treated and all questions answered.  Ulcer right submetatarsal 2 ulceration with fat layer exposed -Debridement as  below. -Dressed with Betadine wet-to-dry, DSD. -Continue off-loading with surgical shoe. -I will place him on doxycycline until resolve meant of the wound  Vascular abnormality -I will order another ABIs PVRs to assess the vascular flow to the foot.  The previous one was done back in 2022 -  Procedure: Excisional Debridement of Wound Tool: Sharp chisel blade/tissue nipper Rationale: Removal of non-viable soft tissue from the wound to promote healing.  Anesthesia: none Pre-Debridement Wound Measurements: 1 cm cm x 0.5 cm x 0.3 cm  Post-Debridement Wound Measurements: 1.5 cm x 0.7 cm x 0.3 cm  Type of Debridement: Sharp Excisional Tissue Removed: Non-viable soft tissue Blood loss: Minimal (<50cc) Depth of Debridement: subcutaneous tissue. Technique: Sharp excisional debridement to bleeding, viable wound base.  Wound Progress: This is my initial evaluation of continue monitor the progression  of the wound Site healing conversation 7 Dressing: Dry, sterile, compression dressing. Disposition: Patient tolerated procedure well. Patient to return in 1 week for follow-up.  No follow-ups on file.

## 2022-10-09 ENCOUNTER — Ambulatory Visit (INDEPENDENT_AMBULATORY_CARE_PROVIDER_SITE_OTHER): Payer: Medicare Other | Admitting: Dietician

## 2022-10-09 ENCOUNTER — Other Ambulatory Visit: Payer: Self-pay | Admitting: Dietician

## 2022-10-09 ENCOUNTER — Encounter: Payer: Self-pay | Admitting: Dietician

## 2022-10-09 DIAGNOSIS — Z7984 Long term (current) use of oral hypoglycemic drugs: Secondary | ICD-10-CM | POA: Diagnosis not present

## 2022-10-09 DIAGNOSIS — Z794 Long term (current) use of insulin: Secondary | ICD-10-CM

## 2022-10-09 DIAGNOSIS — E1151 Type 2 diabetes mellitus with diabetic peripheral angiopathy without gangrene: Secondary | ICD-10-CM

## 2022-10-09 MED ORDER — DEXCOM G7 SENSOR MISC: 3 refills | Status: DC

## 2022-10-09 MED ORDER — ACCU-CHEK GUIDE W/DEVICE KIT: PACK | 1 refills | Status: DC

## 2022-10-09 MED ORDER — ACCU-CHEK SOFTCLIX LANCETS MISC: 3 refills | Status: DC

## 2022-10-09 MED ORDER — ACCU-CHEK GUIDE VI STRP: ORAL_STRIP | 5 refills | Status: DC

## 2022-10-09 NOTE — Patient Instructions (Addendum)
Thank you for your visit today!   We placed a Dexcom G7 sensor for you to use.   Your goal is to lower your A1c to your goal of "6"- aim for blood sugars between 126 and 155 you will have an A1c.   -Changes your juices to sugar free  -Increase activity- Exercise program then silver Sneakers.  -Consider talking to Dr. Heber Marshall about medicine changes. (Increasing Trulicity?)  Butch Penny [phone]

## 2022-10-09 NOTE — Telephone Encounter (Signed)
Richard [name] would like prescriptions for Dexcom G7 sensors, Accu chek guide meter and supplies. He would also like a referral to the PREP exercise program.

## 2022-10-09 NOTE — Progress Notes (Signed)
Diabetes Self-Management Education  Visit Type: Annual Follow-Up  Appt. Start Time: 812 Appt. End Time: 912  10/09/2022  [name], identified by name and date of birth, is a 62 y.o. male with a diagnosis of Diabetes:  Type 2  ASSESSMENT  Richard [name] wants help with self monitoring and getting his A1c to his goal of "6".   He elected not to weigh today. He stayed in the wheelchair today.  His weight is in an acceptable range, however he could decrease it to a BMI of 26 if desired.    WE placed a free sample Dexcom G7 after he downloaded the app on his phone. The first one did not pair, Dexcom Tech support felt it was the sensor. A second sample was applies and paired without problems. He requests a prescription for same and new meter and supplies as a back up.   Estimated body mass index is 28.07 kg/m as calculated from the following:   Height as of 09/27/22: '6\' 2"'$  (1.88 m).   Weight as of 09/27/22: 218 lb 9.6 oz (99.2 kg). Wt Readings from Last 10 Encounters:  09/27/22 218 lb 9.6 oz (99.2 kg)  09/07/22 217 lb (98.4 kg)  09/04/22 218 lb (98.9 kg)  07/18/22 214 lb (97.1 kg)  06/22/22 215 lb 9.6 oz (97.8 kg)  06/14/22 222 lb 4.8 oz (100.8 kg)  06/10/22 220 lb (99.8 kg)  06/05/22 220 lb (99.8 kg)  03/08/22 220 lb 8 oz (100 kg)  12/14/21 224 lb 11.2 oz (101.9 kg)   Lab Results  Component Value Date   HGBA1C 10.4 (A) 09/27/2022   HGBA1C 8.6 (A) 06/14/2022   HGBA1C 8.7 (A) 03/08/2022   HGBA1C 8.9 (A) 12/14/2021   HGBA1C 9.9 (A) 08/17/2021       Diabetes Self-Management Education - 10/09/22 1100       Visit Information   Visit Type Annual Follow-Up      Health Coping   How would you rate your overall health? Good      Psychosocial Assessment   Patient Belief/Attitude about Diabetes Motivated to manage diabetes    What is the hardest part about your diabetes right now, causing you the most concern, or is the most worrisome to you about your diabetes?   Checking  blood sugar;Making healty food and beverage choices    Self-care barriers Unsteady gait/risk for falls;Lack of material resources    Self-management support Family    Other persons present Family Member   12 year old grandaughter   Patient Concerns Glycemic Control;Problem Solving;Monitoring;Nutrition/Meal planning    Special Needs None    Preferred Learning Style No preference indicated    Learning Readiness Ready    How often do you need to have someone help you when you read instructions, pamphlets, or other written materials from your doctor or pharmacy? 2 - Rarely    What is the last grade level you completed in school? 12      Pre-Education Assessment   Patient understands the diabetes disease and treatment process. Comprehends key points    Patient understands incorporating nutritional management into lifestyle. Needs Review    Patient undertands incorporating physical activity into lifestyle. Comprehends key points    Patient understands using medications safely. Comprehends key points    Patient understands monitoring blood glucose, interpreting and using results Needs Review    Patient understands prevention, detection, and treatment of acute complications. Demonstrates understanding / competency    Patient understands prevention, detection, and  treatment of chronic complications. Demonstrates understanding / competency    Patient understands how to develop strategies to address psychosocial issues. Comprehends key points    Patient understands how to develop strategies to promote health/change behavior. Needs Review      Complications   Last HgB A1C per patient/outside source 10.4 %    How often do you check your blood sugar? 0 times/day (not testing)    Fasting Blood glucose range (mg/dL) --   unknown   Postprandial Blood glucose range (mg/dL) --   unknown   Number of hypoglycemic episodes per month --   0   Number of hyperglycemic episodes ( >'200mg'$ /dL): --   He thinks often,  but does not know for sure because he has not been checking because he has no meter   Have you had a dilated eye exam in the past 12 months? No    Have you had a dental exam in the past 12 months? No    Are you checking your feet? Yes    How many days per week are you checking your feet? 7      Dietary Intake   Breakfast not discussed today in detail, he says he knows his juices are a big problem    Beverage(s) juice, water      Activity / Exercise   Activity / Exercise Type ADL's;Light (walking / raking leaves)   wants assistnace in starting to go back to gym using silver sneakers     Patient Education   Previous Diabetes Education Yes (please comment)   with me in the past   Healthy Eating Food label reading, portion sizes and measuring food.;Information on hints to eating out and maintain blood glucose control.    Monitoring Taught/evaluated CGM (comment)    Lifestyle and Health Coping Helped patient develop diabetes management plan for (enter comment)   reaching his stated A1c/blood sugar goals     Individualized Goals (developed by patient)   Nutrition Follow meal plan discussed   drink zero sugar juices   Monitoring  Consistenly use CGM      Outcomes   Expected Outcomes Demonstrated interest in learning. Expect positive outcomes    Future DMSE 2 wks    Program Status Re-entered      Subsequent Visit   Since your last visit have you continued or begun to take your medications as prescribed? Yes    Since your last visit have you had your blood pressure checked? Yes    Is your most recent blood pressure lower, unchanged, or higher since your last visit? Lower    Since your last visit have you experienced any weight changes? Loss    Weight Loss (lbs) 12    Since your last visit, are you checking your blood glucose at least once a day? No   does not have a meter, wants to use a CGM            Individualized Plan for Diabetes Self-Management Training:   Learning Objective:   Patient will have a greater understanding of diabetes self-management. Patient education plan is to attend individual and/or group sessions per assessed needs and concerns.   Plan:   Patient Instructions  Thank you for your visit today!   We placed a Dexcom G7 sensor for you to use.   Your goal to lower your A1c to your goal of "6"- aim for blood sugars btweeen 126 and 155 you will have an A1c.   Changes your  juices to sugar free  Increase activity- Exercise program then silver Sneakers.  Consider talking to Dr. Heber Van Zandt about medicine changes.  Richard [name] [phone]   Expected Outcomes:  Demonstrated interest in learning. Expect positive outcomes Education material provided: Diabetes Resources If problems or questions, patient to contact team via:  Phone Future DSME appointment: 2 wks Debera Lat, RD 10/09/2022 11:17 AM.

## 2022-10-11 ENCOUNTER — Telehealth: Payer: Self-pay | Admitting: Dietician

## 2022-10-11 ENCOUNTER — Ambulatory Visit (HOSPITAL_COMMUNITY)
Admission: RE | Admit: 2022-10-11 | Discharge: 2022-10-11 | Disposition: A | Discharge status: Home / Self Care | Payer: Medicare Other | Source: Ambulatory Visit | Attending: Podiatry | Admitting: Podiatry

## 2022-10-11 ENCOUNTER — Other Ambulatory Visit: Payer: Self-pay | Admitting: Podiatry

## 2022-10-11 DIAGNOSIS — L97512 Non-pressure chronic ulcer of other part of right foot with fat layer exposed: Secondary | ICD-10-CM | POA: Diagnosis not present

## 2022-10-11 NOTE — Telephone Encounter (Signed)
Sent PA for Dexcom G7 sensors through covermymeds online portal: awaiting a response.

## 2022-10-12 ENCOUNTER — Telehealth: Payer: Self-pay

## 2022-10-12 NOTE — Telephone Encounter (Signed)
Called to discuss PREP program referral is in school on Mondays and Thursday; uses transportation to go to classes and will use for PREP classes too; Richard [name] is closer, but he wants to get his class schedule at school set and will call me back with best days/times/location so that he can attend PREP at either [place] or [place].

## 2022-10-15 ENCOUNTER — Telehealth: Payer: Self-pay | Admitting: Internal Medicine

## 2022-10-15 NOTE — Telephone Encounter (Signed)
Call back to patient. He states the pain in his right arm is not where the dexcom sensor is more skin related. He states it has been going on for three days or maybe more. He states the dexcom is giving him crazy readings' that are considerably higher than his fingersticks.  His dexom reads 200s and 300s, such 242 when his fingerstick reads 136.  At 131 pm: Fingerstick is- 124;  Sensor is reading- 214; he has only had coffee early this morning.  Meter readings: 144/156/288/111/192 Dexcom 276 and above. He denies any other symptoms and says his foot infection that he checks daily looks fine.  He took the sensor off  said the site looked fine, not red or warm. He agreed if the pain does not stop to call our office or go the the emergency room.

## 2022-10-15 NOTE — Telephone Encounter (Signed)
Pt reporting his arm that has the dexacom inserted is hurting.  He is unsure if it is coming from the dexacom but, the readings on his arm have been up and he is really concerned.

## 2022-10-15 NOTE — Telephone Encounter (Signed)
Agree with plan 

## 2022-10-18 ENCOUNTER — Encounter: Payer: Self-pay | Admitting: Dietician

## 2022-10-18 ENCOUNTER — Ambulatory Visit (INDEPENDENT_AMBULATORY_CARE_PROVIDER_SITE_OTHER): Payer: 59 | Admitting: Podiatry

## 2022-10-18 VITALS — BP 130/68

## 2022-10-18 DIAGNOSIS — M2041 Other hammer toe(s) (acquired), right foot: Secondary | ICD-10-CM | POA: Diagnosis not present

## 2022-10-18 DIAGNOSIS — M2042 Other hammer toe(s) (acquired), left foot: Secondary | ICD-10-CM | POA: Diagnosis not present

## 2022-10-18 DIAGNOSIS — E1140 Type 2 diabetes mellitus with diabetic neuropathy, unspecified: Secondary | ICD-10-CM | POA: Diagnosis not present

## 2022-10-18 DIAGNOSIS — I999 Unspecified disorder of circulatory system: Secondary | ICD-10-CM

## 2022-10-18 NOTE — Progress Notes (Signed)
Subjective:  Patient ID: Richard [name], male    DOB: 12/28/1960,  MRN: [id number]  Chief Complaint  Patient presents with   Foot Ulcer    Right foot ulcer     62 y.o. male presents for wound care.  Patient presents for follow-up of right submetatarsal 2 ulceration.  He states is doing a lot better.  Minimal Betadine wet-to-dry dressing denies any other acute complaints.   Review of Systems: Negative except as noted in the HPI. Denies N/V/F/Ch.  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 (Patient not taking: Reported on 09/07/2022), Disp: 14 patch, Rfl: 0   nicotine (NICODERM CQ - DOSED IN MG/24 HR) 7 mg/24hr patch, Place 1 patch (7 mg total) onto the skin daily. (Patient not taking: Reported on 09/07/2022), Disp: 21 patch, Rfl: 0   Accu-Chek Softclix Lancets lancets, Use to check blood sugar up to 2 times a day, Disp: 200 each, Rfl: 3   albuterol (VENTOLIN HFA) 108 (90 Base) MCG/ACT inhaler, Inhale 1-2 puffs into the lungs every 4 (four) hours as needed for wheezing or shortness of breath., Disp: 6.7 g, Rfl: 2   ascorbic acid (VITAMIN C) 500 MG tablet,  Take 500 mg by mouth daily., Disp: , Rfl:    aspirin EC 81 MG EC tablet, Take 1 tablet (81 mg total) by mouth daily., Disp: 30 tablet, Rfl: 3   atorvastatin (LIPITOR) 40 MG tablet, Take 1 tablet (40 mg total) by mouth daily., Disp: 90 tablet, Rfl: 3   Blood Glucose Monitoring Suppl (ACCU-CHEK GUIDE) w/Device KIT, Use to check blood sugar up to 2 times a day, Disp: 1 kit, Rfl: 1   Budeson-Glycopyrrol-Formoterol (BREZTRI AEROSPHERE) 160-9-4.8 MCG/ACT AERO, Inhale 2 puffs into the lungs in the morning and at bedtime., Disp: 5.9 g, Rfl: 0   Budeson-Glycopyrrol-Formoterol (BREZTRI AEROSPHERE) 160-9-4.8 MCG/ACT AERO, Inhale 2 puffs into the lungs in the morning and at bedtime., Disp: 1 each, Rfl: 11   Continuous Blood Gluc Sensor (DEXCOM G7 SENSOR) MISC, Place new sensor every 10 days and Use to monitor blood sugar continuously, Disp: 9 each, Rfl: 3   doxycycline (VIBRA-TABS) 100 MG tablet, Take 1 tablet (100 mg total) by mouth 2 (two) times daily., Disp: 60 tablet, Rfl: 0   Dulaglutide (TRULICITY) 3 WI/2.0BT SOPN, Inject 3 mg as directed once a week. (Patient taking differently: Inject 3 mg as directed once a week. Sunday), Disp: 2 mL, Rfl: 11   Empagliflozin-metFORMIN HCl (SYNJARDY) 12.02-999 MG TABS, Take 1 tablet by  mouth 2 (two) times daily., Disp: 180 tablet, Rfl: 3   fluticasone (FLONASE) 50 MCG/ACT nasal spray, Place 1 spray into both nostrils daily., Disp: 16 g, Rfl: 0   glucose blood (ACCU-CHEK GUIDE) test strip, USE TO CHECK BLOOD SUGARS TWICE DAILY, Disp: 200 strip, Rfl: 5   ipratropium-albuterol (DUONEB) 0.5-2.5 (3) MG/3ML SOLN, Take 3 mLs by nebulization every 6 (six) hours as needed (for wheezing not relieved by albuterol inhaler)., Disp: 360 mL, Rfl: 3   montelukast (SINGULAIR) 10 MG tablet, Take 1 tablet (10 mg total) by mouth at bedtime., Disp: 90 tablet, Rfl: 3   Multiple Vitamins-Minerals (MULTI FOR HIM 50+ PO), Take 1 tablet by mouth daily., Disp: , Rfl:    nitroGLYCERIN (NITRODUR -  DOSED IN MG/24 HR) 0.2 mg/hr patch, Place 1 patch (0.2 mg total) onto the skin daily., Disp: 30 patch, Rfl: 12   pioglitazone (ACTOS) 30 MG tablet, Take 1 tablet (30 mg total) by mouth daily., Disp: 90 tablet, Rfl: 3   pregabalin (LYRICA) 100 MG capsule, Take 1 capsule (100 mg total) by mouth 3 (three) times daily., Disp: 90 capsule, Rfl: 2   varenicline (CHANTIX) 0.5 MG tablet, Take 1 tablet (0.5 mg total) by mouth 2 (two) times daily. (Patient not taking: Reported on 09/07/2022), Disp: 90 tablet, Rfl: 3  Social History   Tobacco Use  Smoking Status Former   Packs/day: 1.00   Years: 45.00   Total pack years: 45.00   Types: Cigarettes   Quit date: 08/22/2022   Years since quitting: 0.1  Smokeless Tobacco Never    Allergies  Allergen Reactions   Shellfish Allergy Anaphylaxis and Shortness Of Breath   Objective:   Vitals:   10/18/22 0814  BP: 130/68   There is no height or weight on file to calculate BMI. Constitutional Well developed. Well nourished.  Vascular Dorsalis pedis pulses palpable bilaterally. Posterior tibial pulses palpable bilaterally. Capillary refill normal to all digits.  No cyanosis or clubbing noted. Pedal hair growth normal.  Neurologic Normal speech. Oriented to person, place, and time. Protective sensation absent  Dermatologic Right submetatarsal 2 ulceration completely epithelialized.  No sign of ulceration noted.  Plantarflexed second metatarsal noted  Orthopedic: No pain to palpation either foot.   Radiographs: None Assessment:   1. Hammertoe, bilateral   2. Diabetic neuropathy, painful ([place])   3. Vascular abnormality     Plan:  Patient was evaluated and treated and all questions answered.  Ulcer right submetatarsal 2 ulceration with fat layer exposed -Clinically reepithelialized.  The wound has healed.  I discussed to bring his A1c down below 8% to consider doing a floating osteotomy of the second.  Patient states understand will work on  his A1c  Hammertoe with history of diabetes -I explained to the patient etiology of hammertoe contracture versus treatment options were discussed.  Patient will benefit from diabetic shoes contracture present and uncontrolled A1c.  Patient agrees with plan like to proceed with diabetic shoes -He will be scheduled to get casted for diabetic shoes  Vascular abnormality -ABIs PVRs were assessed which shows noncompressible arteries to both lower extremity he will benefit from vascular consult. -Vascular consult was ordered.  No follow-ups on file.

## 2022-10-18 NOTE — Telephone Encounter (Signed)
Prior Auth for patients medication DEXCOM G7 SENSOR denied by [facility] via fax.   Reason:   Butch Penny, I have emailed you denial letter from insurance.

## 2022-10-19 ENCOUNTER — Encounter (HOSPITAL_COMMUNITY): Payer: Self-pay | Admitting: Clinical

## 2022-10-19 ENCOUNTER — Ambulatory Visit
Admission: RE | Admit: 2022-10-19 | Discharge: 2022-10-19 | Disposition: A | Discharge status: Home / Self Care | Payer: Medicare Other | Source: Ambulatory Visit | Attending: Podiatry | Admitting: Podiatry

## 2022-10-19 ENCOUNTER — Ambulatory Visit (INDEPENDENT_AMBULATORY_CARE_PROVIDER_SITE_OTHER): Payer: 59 | Admitting: Clinical

## 2022-10-19 ENCOUNTER — Encounter (HOSPITAL_COMMUNITY): Payer: Self-pay

## 2022-10-19 DIAGNOSIS — F32A Depression, unspecified: Secondary | ICD-10-CM

## 2022-10-19 DIAGNOSIS — F419 Anxiety disorder, unspecified: Secondary | ICD-10-CM

## 2022-10-19 DIAGNOSIS — L97512 Non-pressure chronic ulcer of other part of right foot with fat layer exposed: Secondary | ICD-10-CM

## 2022-10-19 HISTORY — PX: IR RADIOLOGIST EVAL & MGMT: IMG5224
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
## 2022-10-22 ENCOUNTER — Other Ambulatory Visit: Payer: Self-pay | Admitting: Student

## 2022-10-23 ENCOUNTER — Other Ambulatory Visit: Payer: Self-pay

## 2022-10-24 ENCOUNTER — Telehealth: Payer: Self-pay | Admitting: Interventional Cardiology

## 2022-10-24 ENCOUNTER — Telehealth: Payer: Self-pay

## 2022-10-24 NOTE — Telephone Encounter (Signed)
   Name: Richard [name]  DOB: Jun 17, 1961  MRN: [id number]  Primary Cardiologist: Larae Grooms, MD   Preoperative team, please contact this patient and set up a phone call appointment for further preoperative risk assessment. Please obtain consent and complete medication review. Thank you for your help.  I confirm that guidance regarding antiplatelet and oral anticoagulation therapy has been completed and, if necessary, noted below.  Patient is on 81 mg ASA however no guidance requested for holding ASA at this time.  Mable Fill, Marissa Nestle, NP 10/24/2022, 10:15 AM [place]

## 2022-10-24 NOTE — Telephone Encounter (Signed)
  Patient Consent for Virtual Visit        Richard [name] has provided verbal consent on 10/24/2022 for a virtual visit (video or telephone).   CONSENT FOR VIRTUAL VISIT FOR:  Richard [name]  By participating in this virtual visit I agree to the following:  I hereby voluntarily request, consent and authorize [facility] and its employed or contracted physicians, physician assistants, nurse practitioners or other licensed health care professionals (the Practitioner), to provide me with telemedicine health care services (the "Services") as deemed necessary by the treating Practitioner. I acknowledge and consent to receive the Services by the Practitioner via telemedicine. I understand that the telemedicine visit will involve communicating with the Practitioner through live audiovisual communication technology and the disclosure of certain medical information by electronic transmission. I acknowledge that I have been given the opportunity to request an in-person assessment or other available alternative prior to the telemedicine visit and am voluntarily participating in the telemedicine visit.  I understand that I have the right to withhold or withdraw my consent to the use of telemedicine in the course of my care at any time, without affecting my right to future care or treatment, and that the Practitioner or I may terminate the telemedicine visit at any time. I understand that I have the right to inspect all information obtained and/or recorded in the course of the telemedicine visit and may receive copies of available information for a reasonable fee.  I understand that some of the potential risks of receiving the Services via telemedicine include:  Delay or interruption in medical evaluation due to technological equipment failure or disruption; Information transmitted may not be sufficient (e.g. poor resolution of images) to allow for appropriate medical decision making by the  Practitioner; and/or  In rare instances, security protocols could fail, causing a breach of personal health information.  Furthermore, I acknowledge that it is my responsibility to provide information about my medical history, conditions and care that is complete and accurate to the best of my ability. I acknowledge that Practitioner's advice, recommendations, and/or decision may be based on factors not within their control, such as incomplete or inaccurate data provided by me or distortions of diagnostic images or specimens that may result from electronic transmissions. I understand that the practice of medicine is not an exact science and that Practitioner makes no warranties or guarantees regarding treatment outcomes. I acknowledge that a copy of this consent can be made available to me via my patient portal ([facility]), or I can request a printed copy by calling the office of [facility].    I understand that my insurance will be billed for this visit.   I have read or had this consent read to me. I understand the contents of this consent, which adequately explains the benefits and risks of the Services being provided via telemedicine.  I have been provided ample opportunity to ask questions regarding this consent and the Services and have had my questions answered to my satisfaction. I give my informed consent for the services to be provided through the use of telemedicine in my medical care

## 2022-10-24 NOTE — Telephone Encounter (Signed)
Spoke with patient who is agreeable to do a tele visit on 1/31 at 3 pm. Med rec and consent have been done.

## 2022-10-24 NOTE — Telephone Encounter (Signed)
   Pre-operative Risk Assessment    Patient Name: Richard [name]  DOB: [birth year] MRN: [id number]      Request for Surgical Clearance    Procedure:   Aorta Peripheral Angiogram and possible intervention  Date of Surgery:  Clearance TBD                                 Surgeon:  Dr. Corrie Mckusick Surgeon's Group or Practice Name:  [facility] Phone number:  [phone]  Fax number:  [phone]   Type of Clearance Requested:   - Medical    Type of Anesthesia:  General    Additional requests/questions:   Caller stated Dr. Pasty Arch notes are in [facility] and the clearance can also be noted in [facility].  Signed, Heloise Beecham   10/24/2022, 9:34 AM

## 2022-10-25 ENCOUNTER — Ambulatory Visit (INDEPENDENT_AMBULATORY_CARE_PROVIDER_SITE_OTHER): Payer: 59 | Admitting: Physician Assistant

## 2022-10-25 VITALS — BP 135/85 | HR 93 | Temp 98.2°F | Resp 20 | Ht 74.0 in | Wt 219.9 lb

## 2022-10-25 DIAGNOSIS — I739 Peripheral vascular disease, unspecified: Secondary | ICD-10-CM | POA: Diagnosis not present

## 2022-10-25 DIAGNOSIS — I70235 Atherosclerosis of native arteries of right leg with ulceration of other part of foot: Secondary | ICD-10-CM | POA: Diagnosis not present

## 2022-10-25 NOTE — Telephone Encounter (Signed)
Trelegy is not on current med list.
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
## 2022-10-25 NOTE — Progress Notes (Signed)
Patient presents to the office today for diabetic shoe and insole measuring.  Patient was measured with brannock device to determine size and width for 1 pair of extra depth shoes and foam casted for 3 pair of insoles.   ABN needs to be signed at pick up appt.  Documentation of medical necessity will be sent to patient's treating diabetic doctor to verify and sign.   Patient's diabetic provider: [name], DO   Shoes and insoles will be ordered at that time and patient will be notified for an appointment for fitting when they arrive.   Brannock measurement: RIGHT/LEFT: 13.5 M  Patient shoe selection-   1st   Shoe choice:   Orthofeet 633    Shoe size ordered: Men's 14 Medium   Shoe doesn't come in half sizes, ordered up and ordered a thicker insole to accommodate.

## 2022-10-26 ENCOUNTER — Ambulatory Visit: Payer: 59 | Admitting: *Deleted

## 2022-10-26 DIAGNOSIS — I999 Unspecified disorder of circulatory system: Secondary | ICD-10-CM

## 2022-10-26 DIAGNOSIS — M2041 Other hammer toe(s) (acquired), right foot: Secondary | ICD-10-CM

## 2022-10-26 DIAGNOSIS — E1140 Type 2 diabetes mellitus with diabetic neuropathy, unspecified: Secondary | ICD-10-CM

## 2022-10-26 MED ORDER — TRULICITY 3 MG/0.5ML ~~LOC~~ SOAJ: 3.0000 mg | SUBCUTANEOUS | 11 refills | Status: DC
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
## 2022-11-01 ENCOUNTER — Other Ambulatory Visit: Payer: Self-pay

## 2022-11-01 DIAGNOSIS — I70235 Atherosclerosis of native arteries of right leg with ulceration of other part of foot: Secondary | ICD-10-CM

## 2022-11-01 DIAGNOSIS — I70245 Atherosclerosis of native arteries of left leg with ulceration of other part of foot: Secondary | ICD-10-CM

## 2022-11-01 DIAGNOSIS — I739 Peripheral vascular disease, unspecified: Secondary | ICD-10-CM

## 2022-11-02 ENCOUNTER — Ambulatory Visit (INDEPENDENT_AMBULATORY_CARE_PROVIDER_SITE_OTHER): Payer: 59 | Admitting: Clinical

## 2022-11-02 ENCOUNTER — Encounter (HOSPITAL_COMMUNITY): Payer: Self-pay | Admitting: Clinical

## 2022-11-02 DIAGNOSIS — F411 Generalized anxiety disorder: Secondary | ICD-10-CM

## 2022-11-02 DIAGNOSIS — F331 Major depressive disorder, recurrent, moderate: Secondary | ICD-10-CM | POA: Diagnosis not present

## 2022-11-02 NOTE — Progress Notes (Signed)
THERAPIST PROGRESS NOTE  Session Time: 8:00am-9:00am  Participation Level: Active  Behavioral Response: Neat and Well Groomed Alert Anxious and Depressed  Type of Therapy: Individual Therapy  Treatment Goals addressed:  LTG: score less than 5 on the Generalized Anxiety Disorder 7 Scale (GAD-7)  STG: practice problem solving skills 3 times per week for the next 4 weeks  LTG: Reduce frequency, intensity, and duration of depression symptoms so that daily functioning is improved  STG: identify cognitive patterns and beliefs that support depression  LTG: continue to maintain his over 30-year sobriety from cocaine and alcohol   ProgressTowards Goals: Progressing  Interventions: CBT, Motivational Interviewing, and Supportive  Summary: Richard [name] is a 62 y.o. male who presents with depression and anxiety as well as in remission for over 30 years from a crack cocaine addiction.  He states that after his last session, for about 2 days he felt like he had been able to expand his lungs but after that his depression and anxiety returned to their normal level.  He stated his mood continues to fluctuate with some days that are fine but many days where he feels overwhelmed with negative thoughts and emotions.  He continues to struggle with his sleep and always awaken at 4:30am and he also awakens frequently with dreams of fighting someone.  He stated that he cannot see the other person but he believes it to be himself.  He was able to verbalize that therapist is likely accurate in suggesting that it may be a manifestation of him beating himself up. His appetite also fluctuates and he has none at all some days, would prefer not to eat, but he forces himself to eat something twice a day because of his diabetes.  He absolutely denied any suicidal or homicidal ideation.  Richard [name] continues to feel he has nobody to talk to, as his sister simply tells him to pray about it and his daughter starts doing  research to tell him what he should do.  His depression and anxiety are often overwhelming, but then he has to put on a fake front for them.  Therapist introduced the idea of him seeing a psychiatrist to discuss whether he needs medication to help address some of these issues.  He agreed to think about it but did not want to make an appointment yet.  Richard [name] continues to struggle on a daily basis with not reactivating his cocaine addiction, was very tearful in talking about wanting to be "normal."  This was discussed from a 12-step perspective and it was emphasized that in other ways he is "normal" such as having depression, anxiety, desires, hopes, commitments, and such.  He stated clearly that he is not going to activate his addiction, but he plays in his head constantly what would happen if he did.  He believes he would end up dead this time and his family would be devastated.  He was provided positive strokes for "playing the tape through."  Therapist provided an overview of the connection between thoughts, feelings, and behaviors.  We delved into his thoughts in various situations and discussed the cognitive distortions that were in play during those situations, namely mind reading and fortune telling.  He was genuinely curious about how to change his thoughts, was provided a description of "putting thoughts on trial" and how that can impact/change feelings and thus behaviors.  He was provided with a worksheet of cognitive distortions and a worksheet of how to put his thoughts on trial.  He  was assigned the task of taking at least one thought a day through this process between now and next session in 2 weeks.  Suicidal/Homicidal: No without intent/plan  Therapist Response: Therapist provided education about cognitive-emotional-behavioral connections and worked through the [employer] provided to patient as an example of how to practice examining at least one thought daily until next session.   Therapist used Motivational Interviewing regarding medication, his addiction, and "thinking about his thinking."  He asked what therapist thought of his situation and was told that therapist's observation is that he beats himself up and the person in his dreams is indeed himself.  He showed positive willingness to engage in the activities needed to start examining his thoughts to possibly determine that new ones are more accurate which may lead to a better self-image.  Plan: Return again in 2 weeks.  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 Patient/Guardian was advised if they have not already done so to contact the registration department to sign all necessary forms in order for Richard [name] to release information regarding their care.   Consent: Patient/Guardian gives verbal consent for treatment and assignment of benefits for services provided during this visit. Patient/Guardian expressed understanding and agreed to proceed.   Recommendations:  Return to therapy in 2 weeks, engage in self care behaviors, review cognitive distortions list, implement 1 new coping skill as taught in session (putting thoughts on trial), and return to next session prepared to talk about experience with that new coping method.   Maretta Los, LCSW 11/02/2022

## 2022-11-07 ENCOUNTER — Ambulatory Visit: Payer: 59 | Attending: Cardiology | Admitting: Nurse Practitioner

## 2022-11-07 ENCOUNTER — Encounter: Payer: Self-pay | Admitting: Nurse Practitioner

## 2022-11-07 DIAGNOSIS — Z01810 Encounter for preprocedural cardiovascular examination: Secondary | ICD-10-CM | POA: Diagnosis not present

## 2022-11-08 ENCOUNTER — Encounter (HOSPITAL_COMMUNITY): Payer: Self-pay

## 2022-11-08 ENCOUNTER — Other Ambulatory Visit (HOSPITAL_COMMUNITY): Payer: Self-pay | Admitting: Interventional Radiology

## 2022-11-08 DIAGNOSIS — S91301A Unspecified open wound, right foot, initial encounter: Secondary | ICD-10-CM

## 2022-11-09 ENCOUNTER — Encounter (HOSPITAL_COMMUNITY): Payer: Self-pay

## 2022-11-15 ENCOUNTER — Other Ambulatory Visit: Payer: Self-pay | Admitting: Radiology

## 2022-11-15 DIAGNOSIS — L97519 Non-pressure chronic ulcer of other part of right foot with unspecified severity: Secondary | ICD-10-CM

## 2022-11-15 NOTE — Progress Notes (Signed)
PCP - Dr. [name] Cardiologist - Dr. Sharla Kidney  PPM/ICD - Denies  Chest x-ray - 08/22/2022 EKG - 08/22/2022 Stress Test - denies ECHO - 10/06/21 Cardiac Cath - 09/26/2015  CPAP - unknow  Dx Type II Diabetic On GLP-1 Agonist Fasting Blood Sugar - unknown Checks Blood Sugar unknown     Blood Thinner Instructions: Stop taking per surgeons directions.  Aspirin Instructions:  Stop taking per surgeons directions.   ERAS Protcol - NPO  COVID TEST- unknow   Anesthesia review: Yes, Extensive cardiac history along with associated co-morbid conditions.   Patient verbally denies any shortness of breath, fever, cough and chest pain during phone call   -------------  SDW INSTRUCTIONS given:  Your procedure is scheduled on Monday, November 19, 2022.  Report to [facility] Main Entrance "A" at 10:30 A.M., and check in at the Admitting office.  Call this number if you have problems the morning of surgery:  [phone]   Remember:  Do not eat or drink after midnight the night before your surgery    Take these medicines the morning of surgery with A SIP OF WATER : atorvastatin (LIPITOR)  Budeson-Glycopyrrol-Formoterol (BREZTRI AEROSPHERE)  doxycycline (VIBRA-TABS)  fluticasone (FLONASE)  pregabalin (LYRICA)   As Needed: albuterol (VENTOLIN HFA)  ipratropium-albuterol (DUONEB)   Stop taking Aspirin as directed by your surgeon. Please contact their office on clarification as to when to stop taking this medication.   As of today, STOP taking any Aleve, Naproxen, Ibuprofen, Motrin, Advil, Goody's, BC's, all herbal medications, fish oil, and all vitamins.    WHAT DO I DO ABOUT MY DIABETES MEDICATION?   Do NOT take oral diabetes medicines (pills) the morning of surgery. Do NOT take pioglitazone (ACTOS) the morning of surgery.     Stop taking Empagliflozin-metFORMIN HCl (SYNJARDY) seventy two (72) hours before surgery.   STOP taking Dulaglutide (TRULICITY)  seven  days prior to surgery.       The day of surgery, do NOT take other diabetes injectables, including Byetta (exenatide), Bydureon (exenatide ER), Victoza (liraglutide), or Trulicity (dulaglutide).  If your CBG is greater than 220 mg/dL, you may take  of your sliding scale (correction) dose of insulin.   HOW TO MANAGE YOUR DIABETES BEFORE AND AFTER SURGERY  Why is it important to control my blood sugar before and after surgery? Improving blood sugar levels before and after surgery helps healing and can limit problems. A way of improving blood sugar control is eating a healthy diet by:  Eating less sugar and carbohydrates  Increasing activity/exercise  Talking with your doctor about reaching your blood sugar goals High blood sugars (greater than 180 mg/dL) can raise your risk of infections and slow your recovery, so you will need to focus on controlling your diabetes during the weeks before surgery. Make sure that the doctor who takes care of your diabetes knows about your planned surgery including the date and location.  How do I manage my blood sugar before surgery? Check your blood sugar at least 4 times a day, starting 2 days before surgery, to make sure that the level is not too high or low.  Check your blood sugar the morning of your surgery when you wake up and every 2 hours until you get to the Short Stay unit.  If your blood sugar is less than 70 mg/dL, you will need to treat for low blood sugar: Do not take insulin. Treat a low blood sugar (less than 70 mg/dL) with  cup  of clear juice (cranberry or apple), 4 glucose tablets, OR glucose gel. Recheck blood sugar in 15 minutes after treatment (to make sure it is greater than 70 mg/dL). If your blood sugar is not greater than 70 mg/dL on recheck, call [phone] for further instructions. Report your blood sugar to the short stay nurse when you get to Short Stay.  If you are admitted to the hospital after surgery: Your blood sugar  will be checked by the staff and you will probably be given insulin after surgery (instead of oral diabetes medicines) to make sure you have good blood sugar levels. The goal for blood sugar control after surgery is 80-180 mg/dL.                      Do not wear jewelry, make up, or nail polish            Do not wear lotions, powders, perfumes/colognes, or deodorant.            Do not shave 48 hours prior to surgery.  Men may shave face and neck.            Do not bring valuables to the hospital.            [facility] is not responsible for any belongings or valuables.  Do NOT Smoke (Tobacco/Vaping) 24 hours prior to your procedure If you use a CPAP at night, you may bring all equipment for your overnight stay.   Contacts, glasses, dentures or bridgework may not be worn into surgery.      For patients admitted to the hospital, discharge time will be determined by your treatment team.   Patients discharged the day of surgery will not be allowed to drive home, and someone needs to stay with them for 24 hours.    Special instructions:   [facility]- Preparing For Surgery  Before surgery, you can play an important role. Because skin is not sterile, your skin needs to be as free of germs as possible. You can reduce the number of germs on your skin by washing with CHG (chlorahexidine gluconate) Soap before surgery.  CHG is an antiseptic cleaner which kills germs and bonds with the skin to continue killing germs even after washing.    Oral Hygiene is also important to reduce your risk of infection.  Remember - BRUSH YOUR TEETH THE MORNING OF SURGERY WITH YOUR REGULAR TOOTHPASTE  Please do not use if you have an allergy to CHG or antibacterial soaps. If your skin becomes reddened/irritated stop using the CHG.  Do not shave (including legs and underarms) for at least 48 hours prior to first CHG shower. It is OK to shave your face.  Please follow these instructions carefully.   Shower the  NIGHT BEFORE SURGERY and the MORNING OF SURGERY with DIAL Soap.   Pat yourself dry with a CLEAN TOWEL.  Wear CLEAN PAJAMAS to bed the night before surgery  Place CLEAN SHEETS on your bed the night of your first shower and DO NOT SLEEP WITH PETS.   Day of Surgery: Please shower morning of surgery  Wear Clean/Comfortable clothing the morning of surgery Do not apply any deodorants/lotions.   Remember to brush your teeth WITH YOUR REGULAR TOOTHPASTE.   Questions were answered. Patient verbalized understanding of instructions.

## 2022-11-16 ENCOUNTER — Encounter (HOSPITAL_COMMUNITY): Payer: Self-pay | Admitting: Clinical

## 2022-11-16 ENCOUNTER — Encounter (HOSPITAL_COMMUNITY): Payer: Self-pay | Admitting: Interventional Radiology

## 2022-11-16 ENCOUNTER — Other Ambulatory Visit (HOSPITAL_COMMUNITY): Payer: Self-pay | Admitting: Physician Assistant

## 2022-11-16 ENCOUNTER — Other Ambulatory Visit: Payer: Self-pay

## 2022-11-16 ENCOUNTER — Ambulatory Visit (INDEPENDENT_AMBULATORY_CARE_PROVIDER_SITE_OTHER): Payer: 59 | Admitting: Clinical

## 2022-11-16 DIAGNOSIS — F411 Generalized anxiety disorder: Secondary | ICD-10-CM

## 2022-11-16 DIAGNOSIS — F331 Major depressive disorder, recurrent, moderate: Secondary | ICD-10-CM

## 2022-11-16 NOTE — Progress Notes (Addendum)
Spoke with pt for pre-op call. Pt denies cardiac history. Pt is treated for HTN and Diabetes. Instructed pt to hold Synjardy as of now prior to surgery and instructed pt not to take Trulicity Sunday. Pt instructed not to take his Actos day of surgery. Pt voiced understanding.   Shower instructions given to pt.

## 2022-11-16 NOTE — Progress Notes (Signed)
THERAPIST PROGRESS NOTE  Session Time: 8:01am-9:01am   Session # 3  Participation Level: Active  Behavioral Response: Neat and Well Groomed Alert Depressed and Tearful  Type of Therapy: Individual Therapy  Treatment Goals addressed:  LTG: score less than 5 on the Generalized Anxiety Disorder 7 Scale (GAD-7)  STG: practice problem solving skills 3 times per week for the next 4 weeks  LTG: Reduce frequency, intensity, and duration of depression symptoms so that daily functioning is improved  STG: identify cognitive patterns and beliefs that support depression  LTG: continue to maintain his over 30-year sobriety from cocaine and alcohol   ProgressTowards Goals: Not Progressing  Interventions: CBT, Motivational Interviewing, Psychosocial Skills: Square Breathing, and Supportive  Summary: Richard [name] is a 62 y.o. male who presents with depression and anxiety as well as in remission for over 30 years from a crack cocaine addiction.  He stated his week was up and down and it has been harder to handle being "sick and tired."  He finds relief in coming to therapy for about 2 days, but he wants to "be okay forever."  He was tearful for much of the session and at one point had a panic attack, had to use his albuterol inhaler because it started his asthma acting up.  He was shaking so badly therapist helped him to steady his hands to take a puff.  He reported feeling powerless and said he hates not being in control of his own life.  He talked at length about a woman friend who provides him rides and is interested in him but he is not interested in her versus a woman he has met that he is in fact interested in.  He is afraid if he does not tell her about all his issues, it will be lying and she won't want to be around him when she finds out versus if he does tell her she may not want to be around him because he's "coocoo for cocoa puffs."  He realizes he is being too sensitive and sometimes jumps  to conclusions, told of several such instances and he was provided with supportive feedback about "thinking about your thinking" as we discussed last week regarding cognitive distortions and putting thoughts on trial.  He was very upset talking about he feels that he is different than the whole world,, sometimes he is separate and watching the world go by and other times feels he is outside his own body watching himself.  He was tought square breathing and practiced this, was encouraged to practice regularly in order to be able to use it automatically in hard situations such as the panic attack earlier.  He shared that his depression is starting to significantly affect his schooling, as he is not doing homework until the very last moment.  For instance, he has not wanted to do anything all week, so now he only has 2 days left to turn in a great deal of homework, none of which he has started.  Therapist provided psychoeducation on symptoms of depression and tried to normalize this for him.  He does not want to drop out of school, stated this is what he has wanted his whole life.  He wants to have the commitment and excitement he had when he returned for his GED.    The patient asked "am I sick?" And was told again the diagnoses that have been given to him by therapist and what this means.  Therapist suggested that he  really is in need of a medication consult and explained how medicine works to return the brain to a more normal state, that medicine is not to make you a different person but to help you be able to be yourself.  This caused a great deal of upset and crying but he eventually conceded he can make the appointment and talk to someone about it.  He said he cannot go to this appointment without therapist present, because therapist is the only person besides himself in the whole world who knows and understands him.  Suicidal/Homicidal: No - is adamant about this  Therapist Response: Therapist is very  concerned that patient needs to be on medication that can address his depression, at least, and possibly his anxiety.  He is deteriorating and is becoming more unable to cope with situations and emotions.  He keeps all of this hidden from his loved ones, just shows them  his happy face and it is of concern that his emotions are so big and overwhelming but he is fighting to keep them controlled.  At some point this will not longer be feasible and he could relapse into crack cocaine use or have some sort of running away behavior.  He is adamant he would never hurt himself or someone else because of his love for his children, and specifically for his granddaughter who makes him very happy.  We talked at length about his diagnoses and mental health stigma, about the prevalence of African-American therapists today which shows that his community is becoming more accepting of people receiving help.  Therapist compared his mental health to his physical health and how we feel it is okay to take medicine for physical ailments.  He was referred for psychiatry.  Plan: Return again in 2 weeks.  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 Patient/Guardian was advised if they have not already done so to contact the registration department to sign all necessary forms in order for [place] to release information regarding their care.   Consent: Patient/Guardian gives verbal consent for treatment and assignment of benefits for services provided during this visit. Patient/Guardian expressed understanding  and agreed to proceed.   Recommendations:  Return to therapy in 2 weeks, engage in self care behaviors, continue to review cognitive distortions list, continue to implement 1 new coping skill as taught in session (putting thoughts on trial), think about therapist's recommendation to see a psychiatrist for medication   Maretta Los, LCSW 11/16/2022
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
## 2022-11-16 NOTE — Progress Notes (Signed)
   11/16/22 3428  Pre-op Phone Call  Surgery Date Verified 11/19/22  Arrival Time Verified 1000  Surgery Location Verified Admitting  Medical History Reviewed Yes  Is the patient taking a GLP-1 receptor agonist? Yes  Has the patient been informed on holding medication? Yes  Does the patient have diabetes? Type II  Does the patient use a Continuous Blood Glucose Monitor? No  Is the patient on an insulin pump? No  Has the diabetes coordinator been notified? No  Do you have a history of heart problems? No  Does patient have other implanted devices? No  Patient educated about smoking cessation 24 hours prior to surgery. N/A Non-Smoker  Patient verbalizes understanding of bowel prep? N/A  Med Rec Completed Yes  Take the Following Meds the Morning of Surgery Aspirin, Lyrica, Lipitor, Trelegy Ellipta inhaler, Dulera inhaler, Duo-neb - prn, Flonase - prn, Albuterol inhaler - prn (to bring DOS)  Recent  Lab Work, EKG, CXR? Yes  NPO (Including gum & candy) After midnight  Responsible adult to drive and be with you for 24 hours? Yes  Name & Phone Number for Ride/Caregiver Keevon Henney - daughter [phone]  No Jewelry, money, nail polish or make-up.  No lotions, powders, perfumes. No shaving  48 hrs. prior to surgery. Yes  Contacts, Dentures & Glasses Will Have to be Removed Before OR. Yes  Call this number the morning of surgery  with any problems that may cancel your surgery. [phone]  Covid-19 Assessment  Have you had a positive COVID-19 test within the previous 90 days? No  COVID Testing Guidance Proceed with the additional questions.  Patient's surgery required a COVID-19 test (cardiothoracic, complex ENT, and bronchoscopies/ EBUS) No

## 2022-11-16 NOTE — Anesthesia Preprocedure Evaluation (Signed)
Anesthesia Evaluation  Patient identified by MRN, date of birth, ID band Patient awake    Reviewed: Allergy & Precautions, H&P , NPO status , Patient's Chart, lab work & pertinent test results  Airway Mallampati: II  TM Distance: >3 FB Neck ROM: Full    Dental no notable dental hx. (+) Edentulous Upper, Edentulous Lower,    Pulmonary neg pulmonary ROS, asthma , COPD, former smoker   Pulmonary exam normal breath sounds clear to auscultation       Cardiovascular Exercise Tolerance: Good hypertension, + CAD and + Peripheral Vascular Disease  negative cardio ROS Normal cardiovascular exam+ Valvular Problems/Murmurs AS  Rhythm:Regular Rate:Normal     Neuro/Psych   Anxiety      Neuromuscular disease negative neurological ROS  negative psych ROS   GI/Hepatic negative GI ROS, Neg liver ROS,,,  Endo/Other  negative endocrine ROSdiabetes, Type 2    Renal/GU negative Renal ROS  negative genitourinary   Musculoskeletal negative musculoskeletal ROS (+) Arthritis ,    Abdominal   Peds negative pediatric ROS (+)  Hematology negative hematology ROS (+)   Anesthesia Other Findings   Reproductive/Obstetrics negative OB ROS                             Anesthesia Physical Anesthesia Plan  ASA: 4  Anesthesia Plan: General   Post-op Pain Management: Minimal or no pain anticipated   Induction: Intravenous  PONV Risk Score and Plan: 2 and Ondansetron and Dexamethasone  Airway Management Planned: Oral ETT  Additional Equipment: None  Intra-op Plan:   Post-operative Plan: Extubation in OR  Informed Consent: I have reviewed the patients History and Physical, chart, labs and discussed the procedure including the risks, benefits and alternatives for the proposed anesthesia with the patient or authorized representative who has indicated his/her understanding and acceptance.       Plan Discussed  with: Anesthesiologist and CRNA  Anesthesia Plan Comments: (PAT note written 11/16/2022 by Myra Gianotti, PA-C.   DISCUSSION: Patient is a 62 year old male scheduled for the above procedure.    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   Preoperative cardiology input outlined on 11/07/22 by Christen Bame, NP: "Preoperative Cardiovascular Risk Assessment: The patient is doing well from a cardiac perspective. Therefore, based on ACC/AHA guidelines, the patient would be at acceptable risk for the planned procedure without further cardiovascular testing. According to the Revised Cardiac Risk Index (RCRI), his Perioperative Risk of Major Cardiac Event is (%): 0.9 His Functional Capacity in METs is: 6.05 according to the Duke Activity Status Index (DASI)..." She advised he may hold ASA for 5-7 days if needed.   A1c on 09/27/22 was up to 10.4%. He was evaluated by PCP [name], DO. Patient had had multiple courses of steroids for COPD exacerbations and admitted to to occasional missed doses of diabetes medications. He reported compliance with Trulicity. Medication compliance encouraged and since off steroids would hopefully see some improvement. He was referred to [facility] Services. DM medications include Actos 30 mg daily, Synjardy 123456 mg BID, Trulicity 3 mg weekly.  He has a Dexcom G7 monitor, although PAT RN documentation suggests he is not currently using. PAT RN provided pre-anesthesia DM medication instructions per guidelines. He  was instructed not to take his 123XX123 Trulicity dose. Dr. Earleen Newport noted A1c results in his 10/19/22 consult.        Echo 10/06/21: IMPRESSIONS   1. Moderate hypertophy of the  basal septum with otherwise mild concentric  LVH. Left ventricular ejection fraction, by estimation, is 55 to 60%. The  left ventricle has normal function. The left ventricle has no regional  wall motion abnormalities. There  is moderate left ventricular hypertrophy of the basal-septal segment. Left  ventricular diastolic parameters are consistent with Grade I diastolic  dysfunction (impaired relaxation). Elevated left ventricular end-diastolic  pressure.   2. Right ventricular systolic function is normal. The right ventricular  size is normal. There is normal pulmonary artery systolic pressure.   3. Left atrial size was moderately dilated.   4. The mitral valve is normal in structure. No evidence of mitral valve  regurgitation. No evidence of mitral stenosis.   5. The aortic valve is tricuspid. There is moderate calcification of the  aortic valve. There is moderate thickening of the aortic valve. Aortic  valve regurgitation is not visualized. Mild aortic valve stenosis. Aortic  valve area, by VTI measures 1.60  cm. Aortic valve mean gradient measures 12.0 mmHg. Aortic valve Vmax  measures 2.34 m/s.   6. Aortic dilatation noted. There is mild dilatation of the aortic root,  measuring 39 mm. There is mild dilatation of the ascending aorta,  measuring 36 mm.   7. The inferior vena cava is normal in size with <50% respiratory  variability, suggesting right atrial pressure of 8 mmHg.      Nuclear stress test 10/06/21:   The study is normal. The study is low risk.   No ST deviation was noted.   Left ventricular function is normal. Nuclear stress EF: 56 %. The left ventricular ejection fraction is normal (55-65%). End diastolic cavity size is normal.   Prior study not available for comparison.     Cardiac cath 09/26/15:  Mild nonobstructive CAD.  LVEDP 17 mm Hg. Continue aggressive preventive therapy.  He needs to stop smoking.    )       Anesthesia Quick Evaluation

## 2022-11-18 NOTE — H&P (Signed)
  The note originally documented on this encounter has been moved the the encounter in which it belongs.  
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
## 2022-11-19 ENCOUNTER — Other Ambulatory Visit: Payer: Self-pay | Admitting: Radiology

## 2022-11-19 ENCOUNTER — Ambulatory Visit (HOSPITAL_COMMUNITY)
Admission: RE | Admit: 2022-11-19 | Discharge: 2022-11-19 | Disposition: A | Discharge status: Home / Self Care | Payer: 59 | Attending: Interventional Radiology | Admitting: Interventional Radiology

## 2022-11-19 ENCOUNTER — Ambulatory Visit (HOSPITAL_BASED_OUTPATIENT_CLINIC_OR_DEPARTMENT_OTHER): Payer: 59 | Admitting: Vascular Surgery

## 2022-11-19 ENCOUNTER — Encounter (HOSPITAL_COMMUNITY)
Admission: RE | Disposition: A | Discharge status: Home / Self Care | Payer: Self-pay | Source: Home / Self Care | Attending: Interventional Radiology

## 2022-11-19 ENCOUNTER — Ambulatory Visit (HOSPITAL_COMMUNITY): Payer: 59 | Admitting: Vascular Surgery

## 2022-11-19 ENCOUNTER — Ambulatory Visit (HOSPITAL_COMMUNITY)
Admission: RE | Admit: 2022-11-19 | Discharge: 2022-11-19 | Disposition: A | Discharge status: Home / Self Care | Payer: 59 | Source: Ambulatory Visit | Attending: Interventional Radiology | Admitting: Interventional Radiology

## 2022-11-19 DIAGNOSIS — Z7984 Long term (current) use of oral hypoglycemic drugs: Secondary | ICD-10-CM | POA: Diagnosis not present

## 2022-11-19 DIAGNOSIS — Z89422 Acquired absence of other left toe(s): Secondary | ICD-10-CM | POA: Diagnosis not present

## 2022-11-19 DIAGNOSIS — E11621 Type 2 diabetes mellitus with foot ulcer: Secondary | ICD-10-CM | POA: Diagnosis not present

## 2022-11-19 DIAGNOSIS — S91301A Unspecified open wound, right foot, initial encounter: Secondary | ICD-10-CM

## 2022-11-19 DIAGNOSIS — I2510 Atherosclerotic heart disease of native coronary artery without angina pectoris: Secondary | ICD-10-CM | POA: Diagnosis not present

## 2022-11-19 DIAGNOSIS — Z7985 Long-term (current) use of injectable non-insulin antidiabetic drugs: Secondary | ICD-10-CM | POA: Diagnosis not present

## 2022-11-19 DIAGNOSIS — L97519 Non-pressure chronic ulcer of other part of right foot with unspecified severity: Secondary | ICD-10-CM | POA: Insufficient documentation

## 2022-11-19 DIAGNOSIS — I70235 Atherosclerosis of native arteries of right leg with ulceration of other part of foot: Secondary | ICD-10-CM | POA: Diagnosis not present

## 2022-11-19 DIAGNOSIS — Z87891 Personal history of nicotine dependence: Secondary | ICD-10-CM | POA: Insufficient documentation

## 2022-11-19 DIAGNOSIS — I10 Essential (primary) hypertension: Secondary | ICD-10-CM | POA: Diagnosis not present

## 2022-11-19 DIAGNOSIS — J449 Chronic obstructive pulmonary disease, unspecified: Secondary | ICD-10-CM | POA: Insufficient documentation

## 2022-11-19 DIAGNOSIS — E785 Hyperlipidemia, unspecified: Secondary | ICD-10-CM | POA: Diagnosis not present

## 2022-11-19 DIAGNOSIS — E1151 Type 2 diabetes mellitus with diabetic peripheral angiopathy without gangrene: Secondary | ICD-10-CM

## 2022-11-19 HISTORY — DX: Nonrheumatic aortic (valve) stenosis: I35.0

## 2022-11-19 HISTORY — PX: IR ANGIOGRAM EXTREMITY RIGHT: IMG652

## 2022-11-19 HISTORY — PX: IR TIB-PERO ART ATHEREC INC PTA MOD SED: IMG2314

## 2022-11-19 HISTORY — PX: RADIOLOGY WITH ANESTHESIA: SHX6223

## 2022-11-19 HISTORY — DX: Anxiety disorder, unspecified: F41.9

## 2022-11-19 HISTORY — PX: IR US GUIDE VASC ACCESS RIGHT: IMG2390

## 2022-11-19 LAB — CBC
HCT: 41.8 % (ref 39.0–52.0)
Hemoglobin: 13.2 g/dL (ref 13.0–17.0)
MCH: 26.5 pg (ref 26.0–34.0)
MCHC: 31.6 g/dL (ref 30.0–36.0)
MCV: 83.9 fL (ref 80.0–100.0)
Platelets: 251 10*3/uL (ref 150–400)
RBC: 4.98 MIL/uL (ref 4.22–5.81)
RDW: 14.6 % (ref 11.5–15.5)
WBC: 4.7 10*3/uL (ref 4.0–10.5)
nRBC: 0 % (ref 0.0–0.2)

## 2022-11-19 LAB — BASIC METABOLIC PANEL
Anion gap: 11 (ref 5–15)
BUN: 13 mg/dL (ref 8–23)
CO2: 20 mmol/L — ABNORMAL LOW (ref 22–32)
Calcium: 9 mg/dL (ref 8.9–10.3)
Chloride: 106 mmol/L (ref 98–111)
Creatinine, Ser: 0.93 mg/dL (ref 0.61–1.24)
GFR, Estimated: >60 mL/min (ref >60)
Glucose, Bld: 200 mg/dL — ABNORMAL HIGH (ref 70–99)
Potassium: 4.6 mmol/L (ref 3.5–5.1)
Sodium: 137 mmol/L (ref 135–145)

## 2022-11-19 LAB — PROTIME-INR
INR: 1 (ref 0.8–1.2)
Prothrombin Time: 12.9 seconds (ref 11.4–15.2)

## 2022-11-19 LAB — GLUCOSE, CAPILLARY
Glucose-Capillary: 204 mg/dL — ABNORMAL HIGH (ref 70–99)
Glucose-Capillary: 133 mg/dL — ABNORMAL HIGH (ref 70–99)
Glucose-Capillary: 124 mg/dL — ABNORMAL HIGH (ref 70–99)

## 2022-11-19 SURGERY — IR WITH ANESTHESIA
Anesthesia: General | Laterality: Right

## 2022-11-19 MED ORDER — ACETAMINOPHEN 325 MG PO TABS: 325.0000 mg | ORAL_TABLET | ORAL | Status: DC | PRN

## 2022-11-19 MED ORDER — NITROGLYCERIN 1 MG/10 ML FOR IR/CATH LAB
INTRA_ARTERIAL | Status: AC
  Filled 2022-11-19: qty 10

## 2022-11-19 MED ORDER — LIDOCAINE 2% (20 MG/ML) 5 ML SYRINGE
INTRAMUSCULAR | Status: DC | PRN
  Administered 2022-11-19: 100 mg via INTRAVENOUS

## 2022-11-19 MED ORDER — RACEPINEPHRINE HCL 2.25 % IN NEBU
0.5000 mL | INHALATION_SOLUTION | Freq: Once | RESPIRATORY_TRACT | Status: AC
  Administered 2022-11-19: 0.5 mL via RESPIRATORY_TRACT

## 2022-11-19 MED ORDER — MIDAZOLAM HCL 2 MG/2ML IJ SOLN
INTRAMUSCULAR | Status: AC
  Administered 2022-11-19: 2 mg via INTRAVENOUS
  Filled 2022-11-19: qty 2

## 2022-11-19 MED ORDER — ONDANSETRON HCL 4 MG/2ML IJ SOLN
INTRAMUSCULAR | Status: DC | PRN
  Administered 2022-11-19: 4 mg via INTRAVENOUS

## 2022-11-19 MED ORDER — HEPARIN SODIUM (PORCINE) 1000 UNIT/ML IJ SOLN
INTRAMUSCULAR | Status: DC | PRN
  Administered 2022-11-19: 8000 [IU] via INTRAVENOUS

## 2022-11-19 MED ORDER — ROCURONIUM BROMIDE 10 MG/ML (PF) SYRINGE
PREFILLED_SYRINGE | INTRAVENOUS | Status: DC | PRN
  Administered 2022-11-19: 50 mg via INTRAVENOUS

## 2022-11-19 MED ORDER — OXYCODONE HCL 5 MG/5ML PO SOLN: 5.0000 mg | Freq: Once | ORAL | Status: DC | PRN

## 2022-11-19 MED ORDER — ALBUTEROL SULFATE (2.5 MG/3ML) 0.083% IN NEBU
1.5000 mg | INHALATION_SOLUTION | Freq: Once | RESPIRATORY_TRACT | Status: AC
  Administered 2022-11-19: 1.5 mg via RESPIRATORY_TRACT

## 2022-11-19 MED ORDER — SODIUM CHLORIDE 0.9 % IV SOLN: INTRAVENOUS | Status: DC

## 2022-11-19 MED ORDER — SUCCINYLCHOLINE CHLORIDE 200 MG/10ML IV SOSY
PREFILLED_SYRINGE | INTRAVENOUS | Status: DC | PRN
  Administered 2022-11-19: 200 mg via INTRAVENOUS

## 2022-11-19 MED ORDER — PHENYLEPHRINE HCL-NACL 20-0.9 MG/250ML-% IV SOLN
INTRAVENOUS | Status: DC | PRN
  Administered 2022-11-19: 50 ug/min via INTRAVENOUS

## 2022-11-19 MED ORDER — DIPHENHYDRAMINE HCL 50 MG/ML IJ SOLN
INTRAMUSCULAR | Status: AC
  Filled 2022-11-19: qty 1

## 2022-11-19 MED ORDER — DIPHENHYDRAMINE HCL 50 MG/ML IJ SOLN
INTRAMUSCULAR | Status: DC | PRN
  Administered 2022-11-19: 12.5 mg via INTRAVENOUS

## 2022-11-19 MED ORDER — ASPIRIN 325 MG PO TABS
650.0000 mg | ORAL_TABLET | ORAL | Status: AC
  Administered 2022-11-19: 650 mg via ORAL

## 2022-11-19 MED ORDER — FENTANYL CITRATE (PF) 100 MCG/2ML IJ SOLN: 25.0000 ug | INTRAMUSCULAR | Status: DC | PRN

## 2022-11-19 MED ORDER — RACEPINEPHRINE HCL 2.25 % IN NEBU
INHALATION_SOLUTION | RESPIRATORY_TRACT | Status: AC
  Filled 2022-11-19: qty 0.5

## 2022-11-19 MED ORDER — MEPERIDINE HCL 25 MG/ML IJ SOLN: 6.2500 mg | INTRAMUSCULAR | Status: DC | PRN

## 2022-11-19 MED ORDER — MIDAZOLAM HCL 2 MG/2ML IJ SOLN: 2.0000 mg | Freq: Once | INTRAMUSCULAR | Status: AC

## 2022-11-19 MED ORDER — CLOPIDOGREL BISULFATE 75 MG PO TABS: 75.0000 mg | ORAL_TABLET | Freq: Every day | ORAL | 2 refills | Status: DC

## 2022-11-19 MED ORDER — OXYCODONE HCL 5 MG PO TABS: 5.0000 mg | ORAL_TABLET | Freq: Once | ORAL | Status: DC | PRN

## 2022-11-19 MED ORDER — SUGAMMADEX SODIUM 200 MG/2ML IV SOLN
INTRAVENOUS | Status: DC | PRN
  Administered 2022-11-19: 200 mg via INTRAVENOUS

## 2022-11-19 MED ORDER — ALBUTEROL SULFATE (2.5 MG/3ML) 0.083% IN NEBU
INHALATION_SOLUTION | RESPIRATORY_TRACT | Status: AC
  Filled 2022-11-19: qty 3

## 2022-11-19 MED ORDER — CHLORHEXIDINE GLUCONATE 0.12 % MT SOLN
15.0000 mL | Freq: Once | OROMUCOSAL | Status: AC
  Administered 2022-11-19: 15 mL via OROMUCOSAL
  Filled 2022-11-19: qty 15

## 2022-11-19 MED ORDER — PROPOFOL 10 MG/ML IV BOLUS
INTRAVENOUS | Status: DC | PRN
  Administered 2022-11-19: 160 mg via INTRAVENOUS

## 2022-11-19 MED ORDER — ORAL CARE MOUTH RINSE: 15.0000 mL | Freq: Once | OROMUCOSAL | Status: AC

## 2022-11-19 MED ORDER — INSULIN ASPART 100 UNIT/ML IJ SOLN: 0.0000 [IU] | INTRAMUSCULAR | Status: DC | PRN

## 2022-11-19 MED ORDER — ONDANSETRON HCL 4 MG/2ML IJ SOLN: 4.0000 mg | Freq: Once | INTRAMUSCULAR | Status: DC | PRN

## 2022-11-19 MED ORDER — INSULIN ASPART 100 UNIT/ML IJ SOLN
INTRAMUSCULAR | Status: AC
  Administered 2022-11-19: 4 [IU] via SUBCUTANEOUS
  Filled 2022-11-19: qty 1

## 2022-11-19 MED ORDER — ACETAMINOPHEN 160 MG/5ML PO SOLN: 325.0000 mg | ORAL | Status: DC | PRN

## 2022-11-19 MED ORDER — ASPIRIN 325 MG PO TABS
ORAL_TABLET | ORAL | Status: AC
  Filled 2022-11-19: qty 2

## 2022-11-19 MED ORDER — LACTATED RINGERS IV SOLN: INTRAVENOUS | Status: DC

## 2022-11-19 MED ORDER — HEPARIN SODIUM (PORCINE) 1000 UNIT/ML IJ SOLN
INTRAMUSCULAR | Status: AC
  Filled 2022-11-19: qty 10

## 2022-11-19 NOTE — Procedures (Signed)
Interventional Radiology Procedure Note  Procedure:   US guided right CFA access.  Antegrade angiogram RLE Treatment of critical stenosis right PT artery origin, and high grade distal PT stenosis. Laser atherectomy proximally for calcified stenosis.  PTA distal and proximal.  49m distal.  358mproximal.   CELT for closure  Complications: None  Recommendations:  - Stable to PACU - CELT closure.  Has indication for immediate ambulation.  Thus, can be DC'd in 1-2 hours when PACU goals met.  Primary concern would be urinating after GETA. - Advance diet.  - Routine wound care.  - Ok to shower tomorrow - Continue max medical therapy, including 8120mSA daily.  - Do not submerge for 7 days - Follow up with Dr. WagEarleen [facility] clinic in ~4 weeks for post  Signed,  JaiDulcy FannyagEarleen NewportO

## 2022-11-19 NOTE — Anesthesia Procedure Notes (Signed)
Procedure Name: Intubation Date/Time: 11/19/2022 12:25 PM  Performed by: Inda Coke, CRNAPre-anesthesia Checklist: Patient identified, Emergency Drugs available, Suction available, Timeout performed and Patient being monitored Patient Re-evaluated:Patient Re-evaluated prior to induction Oxygen Delivery Method: Circle system utilized Preoxygenation: Pre-oxygenation with 100% oxygen Induction Type: IV induction Ventilation: Mask ventilation without difficulty Laryngoscope Size: Mac and 4 Grade View: Grade III Tube type: Oral Tube size: 8.0 mm Number of attempts: 2 Airway Equipment and Method: Stylet Placement Confirmation: ETT inserted through vocal cords under direct vision, positive ETCO2, CO2 detector and breath sounds checked- equal and bilateral Secured at: 23 cm Tube secured with: Tape Dental Injury: Teeth and Oropharynx as per pre-operative assessment  Future Recommendations: Recommend- induction with short-acting agent, and alternative techniques readily available Comments: DL x 1 with MAC 3 blade and grade 4 view with unsuccessful intubation, DL x 2 by Dr. Ambrose Pancoast with grade 3 view and successful intubation with Sabra Heck 2 blade

## 2022-11-19 NOTE — Sedation Documentation (Signed)
Refer to anesthesia documentation for further case charting

## 2022-11-20 ENCOUNTER — Encounter (HOSPITAL_COMMUNITY): Payer: Self-pay | Admitting: Interventional Radiology

## 2022-11-20 NOTE — Transfer of Care (Signed)
Immediate Anesthesia Transfer of Care Note  Patient: Richard [name]  Procedure(s) Performed: Right lower extremity angiogram (Right)  Patient Location: PACU  Anesthesia Type:General  Level of Consciousness: awake and alert   Airway & Oxygen Therapy: Patient Spontanous Breathing and Patient connected to face mask oxygen  Post-op Assessment: Report given to RN, Post -op Vital signs reviewed and stable, and Patient moving all extremities X 4  Post vital signs: Reviewed and stable  Last Vitals:  Vitals Value Taken Time  BP    Temp    Pulse    Resp    SpO2      Last Pain:  Vitals:   11/19/22 1530  TempSrc:   PainSc: 0-No pain         Complications: No notable events documented.

## 2022-11-20 NOTE — Anesthesia Postprocedure Evaluation (Signed)
Anesthesia Post Note  Patient: Richard [name]  Procedure(s) Performed: Right lower extremity angiogram (Right)     Patient location during evaluation: PACU Anesthesia Type: General Level of consciousness: awake and alert Pain management: pain level controlled Vital Signs Assessment: post-procedure vital signs reviewed and stable Respiratory status: spontaneous breathing, nonlabored ventilation, respiratory function stable and patient connected to nasal cannula oxygen Cardiovascular status: blood pressure returned to baseline and stable Postop Assessment: no apparent nausea or vomiting Anesthetic complications: no  No notable events documented.  Last Vitals:  Vitals:   11/19/22 1515 11/19/22 1530  BP: 115/67 113/67  Pulse: 86 86  Resp: 15 14  Temp:  36.8 C  SpO2: 100% 92%    Last Pain:  Vitals:   11/19/22 1530  TempSrc:   PainSc: 0-No pain                 Darragh Nay

## 2022-11-21 ENCOUNTER — Observation Stay (HOSPITAL_COMMUNITY)
Admission: EM | Admit: 2022-11-21 | Discharge: 2022-11-23 | Disposition: A | Discharge status: Home / Self Care | Payer: 59 | Attending: Internal Medicine | Admitting: Internal Medicine

## 2022-11-21 ENCOUNTER — Ambulatory Visit: Payer: Medicare Other | Admitting: Podiatry

## 2022-11-21 ENCOUNTER — Emergency Department (HOSPITAL_COMMUNITY): Payer: 59

## 2022-11-21 DIAGNOSIS — Z1152 Encounter for screening for COVID-19: Secondary | ICD-10-CM | POA: Diagnosis not present

## 2022-11-21 DIAGNOSIS — L84 Corns and callosities: Secondary | ICD-10-CM | POA: Diagnosis not present

## 2022-11-21 DIAGNOSIS — Z7985 Long-term (current) use of injectable non-insulin antidiabetic drugs: Secondary | ICD-10-CM | POA: Insufficient documentation

## 2022-11-21 DIAGNOSIS — Z89422 Acquired absence of other left toe(s): Secondary | ICD-10-CM | POA: Diagnosis not present

## 2022-11-21 DIAGNOSIS — I2510 Atherosclerotic heart disease of native coronary artery without angina pectoris: Secondary | ICD-10-CM | POA: Diagnosis not present

## 2022-11-21 DIAGNOSIS — I350 Nonrheumatic aortic (valve) stenosis: Secondary | ICD-10-CM | POA: Diagnosis not present

## 2022-11-21 DIAGNOSIS — J45901 Unspecified asthma with (acute) exacerbation: Secondary | ICD-10-CM | POA: Diagnosis present

## 2022-11-21 DIAGNOSIS — E1165 Type 2 diabetes mellitus with hyperglycemia: Secondary | ICD-10-CM | POA: Diagnosis not present

## 2022-11-21 DIAGNOSIS — F419 Anxiety disorder, unspecified: Secondary | ICD-10-CM | POA: Diagnosis present

## 2022-11-21 DIAGNOSIS — I10 Essential (primary) hypertension: Secondary | ICD-10-CM | POA: Diagnosis not present

## 2022-11-21 DIAGNOSIS — Z7984 Long term (current) use of oral hypoglycemic drugs: Secondary | ICD-10-CM | POA: Diagnosis not present

## 2022-11-21 DIAGNOSIS — L309 Dermatitis, unspecified: Secondary | ICD-10-CM | POA: Diagnosis not present

## 2022-11-21 DIAGNOSIS — J4551 Severe persistent asthma with (acute) exacerbation: Secondary | ICD-10-CM

## 2022-11-21 DIAGNOSIS — E1140 Type 2 diabetes mellitus with diabetic neuropathy, unspecified: Secondary | ICD-10-CM | POA: Insufficient documentation

## 2022-11-21 DIAGNOSIS — Z87891 Personal history of nicotine dependence: Secondary | ICD-10-CM | POA: Insufficient documentation

## 2022-11-21 DIAGNOSIS — Z7902 Long term (current) use of antithrombotics/antiplatelets: Secondary | ICD-10-CM | POA: Diagnosis not present

## 2022-11-21 DIAGNOSIS — J441 Chronic obstructive pulmonary disease with (acute) exacerbation: Secondary | ICD-10-CM | POA: Diagnosis not present

## 2022-11-21 DIAGNOSIS — J45909 Unspecified asthma, uncomplicated: Secondary | ICD-10-CM | POA: Diagnosis not present

## 2022-11-21 DIAGNOSIS — R0602 Shortness of breath: Secondary | ICD-10-CM | POA: Diagnosis present

## 2022-11-21 DIAGNOSIS — E1151 Type 2 diabetes mellitus with diabetic peripheral angiopathy without gangrene: Secondary | ICD-10-CM | POA: Diagnosis present

## 2022-11-21 DIAGNOSIS — Z79899 Other long term (current) drug therapy: Secondary | ICD-10-CM | POA: Diagnosis not present

## 2022-11-21 DIAGNOSIS — R072 Precordial pain: Secondary | ICD-10-CM | POA: Insufficient documentation

## 2022-11-21 DIAGNOSIS — Z7982 Long term (current) use of aspirin: Secondary | ICD-10-CM | POA: Insufficient documentation

## 2022-11-21 LAB — CBC WITH DIFFERENTIAL/PLATELET
Abs Immature Granulocytes: 0.03 10*3/uL (ref 0.00–0.07)
Basophils Absolute: 0.1 10*3/uL (ref 0.0–0.1)
Basophils Relative: 2 %
Eosinophils Absolute: 0.3 10*3/uL (ref 0.0–0.5)
Eosinophils Relative: 7 %
HCT: 41.7 % (ref 39.0–52.0)
Hemoglobin: 13 g/dL (ref 13.0–17.0)
Immature Granulocytes: 1 %
Lymphocytes Relative: 17 %
Lymphs Abs: 0.8 10*3/uL (ref 0.7–4.0)
MCH: 25.9 pg — ABNORMAL LOW (ref 26.0–34.0)
MCHC: 31.2 g/dL (ref 30.0–36.0)
MCV: 83.2 fL (ref 80.0–100.0)
Monocytes Absolute: 0.5 10*3/uL (ref 0.1–1.0)
Monocytes Relative: 10 %
Neutro Abs: 2.8 10*3/uL (ref 1.7–7.7)
Neutrophils Relative %: 63 %
Platelets: 272 10*3/uL (ref 150–400)
RBC: 5.01 MIL/uL (ref 4.22–5.81)
RDW: 14.6 % (ref 11.5–15.5)
WBC: 4.5 10*3/uL (ref 4.0–10.5)
nRBC: 0 % (ref 0.0–0.2)

## 2022-11-21 LAB — BASIC METABOLIC PANEL
Anion gap: 12 (ref 5–15)
BUN: 12 mg/dL (ref 8–23)
CO2: 22 mmol/L (ref 22–32)
Calcium: 9.5 mg/dL (ref 8.9–10.3)
Chloride: 101 mmol/L (ref 98–111)
Creatinine, Ser: 1 mg/dL (ref 0.61–1.24)
GFR, Estimated: >60 mL/min (ref >60)
Glucose, Bld: 211 mg/dL — ABNORMAL HIGH (ref 70–99)
Potassium: 4.4 mmol/L (ref 3.5–5.1)
Sodium: 135 mmol/L (ref 135–145)

## 2022-11-21 LAB — TROPONIN I (HIGH SENSITIVITY)
Troponin I (High Sensitivity): 5 ng/L (ref <18)
Troponin I (High Sensitivity): 5 ng/L (ref <18)

## 2022-11-21 LAB — GLUCOSE, CAPILLARY: Glucose-Capillary: 375 mg/dL — ABNORMAL HIGH (ref 70–99)

## 2022-11-21 LAB — RESP PANEL BY RT-PCR (RSV, FLU A&B, COVID)  RVPGX2
Influenza A by PCR: NEGATIVE
Influenza B by PCR: NEGATIVE
Resp Syncytial Virus by PCR: NEGATIVE
SARS Coronavirus 2 by RT PCR: NEGATIVE

## 2022-11-21 LAB — BRAIN NATRIURETIC PEPTIDE: B Natriuretic Peptide: 81.1 pg/mL (ref 0.0–100.0)

## 2022-11-21 MED ORDER — FENTANYL CITRATE PF 50 MCG/ML IJ SOSY
50.0000 ug | PREFILLED_SYRINGE | Freq: Once | INTRAMUSCULAR | Status: AC
  Administered 2022-11-21: 50 ug via INTRAVENOUS
  Filled 2022-11-21: qty 1

## 2022-11-21 MED ORDER — BUDESON-GLYCOPYRROL-FORMOTEROL 160-9-4.8 MCG/ACT IN AERO: 2.0000 | INHALATION_SPRAY | Freq: Two times a day (BID) | RESPIRATORY_TRACT | Status: DC

## 2022-11-21 MED ORDER — ASPIRIN 81 MG PO TBEC
81.0000 mg | DELAYED_RELEASE_TABLET | Freq: Every day | ORAL | Status: DC
  Administered 2022-11-22 – 2022-11-23 (×2): 81 mg via ORAL
  Filled 2022-11-21 (×2): qty 1

## 2022-11-21 MED ORDER — METFORMIN HCL 500 MG PO TABS
1000.0000 mg | ORAL_TABLET | Freq: Two times a day (BID) | ORAL | Status: DC
  Administered 2022-11-22 – 2022-11-23 (×3): 1000 mg via ORAL
  Filled 2022-11-21 (×3): qty 2

## 2022-11-21 MED ORDER — CLOPIDOGREL BISULFATE 75 MG PO TABS
75.0000 mg | ORAL_TABLET | Freq: Every day | ORAL | Status: DC
  Administered 2022-11-22 – 2022-11-23 (×2): 75 mg via ORAL
  Filled 2022-11-21 (×2): qty 1

## 2022-11-21 MED ORDER — UMECLIDINIUM BROMIDE 62.5 MCG/ACT IN AEPB
1.0000 | INHALATION_SPRAY | Freq: Every day | RESPIRATORY_TRACT | Status: DC
  Administered 2022-11-22 – 2022-11-23 (×2): 1 via RESPIRATORY_TRACT
  Filled 2022-11-21: qty 7

## 2022-11-21 MED ORDER — ALBUTEROL SULFATE (2.5 MG/3ML) 0.083% IN NEBU
INHALATION_SOLUTION | RESPIRATORY_TRACT | Status: AC
  Administered 2022-11-21: 5 mg via RESPIRATORY_TRACT
  Filled 2022-11-21: qty 3

## 2022-11-21 MED ORDER — ALBUTEROL SULFATE (2.5 MG/3ML) 0.083% IN NEBU
10.0000 mg/h | INHALATION_SOLUTION | Freq: Once | RESPIRATORY_TRACT | Status: AC
  Administered 2022-11-21: 10 mg/h via RESPIRATORY_TRACT
  Filled 2022-11-21: qty 3

## 2022-11-21 MED ORDER — RIVAROXABAN 10 MG PO TABS
10.0000 mg | ORAL_TABLET | Freq: Every day | ORAL | Status: DC
  Administered 2022-11-22 – 2022-11-23 (×2): 10 mg via ORAL
  Filled 2022-11-21 (×2): qty 1

## 2022-11-21 MED ORDER — PREDNISONE 20 MG PO TABS
40.0000 mg | ORAL_TABLET | Freq: Every day | ORAL | Status: DC
  Administered 2022-11-22 – 2022-11-23 (×2): 40 mg via ORAL
  Filled 2022-11-21 (×2): qty 2

## 2022-11-21 MED ORDER — PREGABALIN 100 MG PO CAPS
100.0000 mg | ORAL_CAPSULE | Freq: Three times a day (TID) | ORAL | Status: DC
  Administered 2022-11-21 – 2022-11-23 (×5): 100 mg via ORAL
  Filled 2022-11-21 (×5): qty 1

## 2022-11-21 MED ORDER — IPRATROPIUM-ALBUTEROL 0.5-2.5 (3) MG/3ML IN SOLN
RESPIRATORY_TRACT | Status: AC
  Administered 2022-11-21: 9 mL via RESPIRATORY_TRACT
  Filled 2022-11-21: qty 9

## 2022-11-21 MED ORDER — EMPAGLIFLOZIN-METFORMIN HCL 12.5-1000 MG PO TABS: 1.0000 | ORAL_TABLET | Freq: Two times a day (BID) | ORAL | Status: DC

## 2022-11-21 MED ORDER — SENNOSIDES-DOCUSATE SODIUM 8.6-50 MG PO TABS: 1.0000 | ORAL_TABLET | Freq: Every evening | ORAL | Status: DC | PRN

## 2022-11-21 MED ORDER — INSULIN ASPART 100 UNIT/ML IJ SOLN
0.0000 [IU] | Freq: Three times a day (TID) | INTRAMUSCULAR | Status: DC
  Administered 2022-11-22: 2 [IU] via SUBCUTANEOUS
  Administered 2022-11-22: 11 [IU] via SUBCUTANEOUS

## 2022-11-21 MED ORDER — INSULIN ASPART 100 UNIT/ML IJ SOLN
0.0000 [IU] | Freq: Every day | INTRAMUSCULAR | Status: DC
  Administered 2022-11-21: 5 [IU] via SUBCUTANEOUS

## 2022-11-21 MED ORDER — METHYLPREDNISOLONE SODIUM SUCC 125 MG IJ SOLR
125.0000 mg | Freq: Once | INTRAMUSCULAR | Status: AC
  Administered 2022-11-21: 125 mg via INTRAVENOUS
  Filled 2022-11-21: qty 2

## 2022-11-21 MED ORDER — IPRATROPIUM-ALBUTEROL 0.5-2.5 (3) MG/3ML IN SOLN: 9.0000 mL | Freq: Once | RESPIRATORY_TRACT | Status: AC

## 2022-11-21 MED ORDER — HYDROXYZINE HCL 10 MG PO TABS
10.0000 mg | ORAL_TABLET | Freq: Once | ORAL | Status: AC
  Administered 2022-11-21: 10 mg via ORAL
  Filled 2022-11-21: qty 1

## 2022-11-21 MED ORDER — EMPAGLIFLOZIN 10 MG PO TABS
10.0000 mg | ORAL_TABLET | Freq: Two times a day (BID) | ORAL | Status: DC
  Administered 2022-11-21 – 2022-11-23 (×4): 10 mg via ORAL
  Filled 2022-11-21 (×5): qty 1

## 2022-11-21 MED ORDER — ATORVASTATIN CALCIUM 40 MG PO TABS
40.0000 mg | ORAL_TABLET | Freq: Every day | ORAL | Status: DC
  Administered 2022-11-22 – 2022-11-23 (×2): 40 mg via ORAL
  Filled 2022-11-21 (×2): qty 1

## 2022-11-21 MED ORDER — MOMETASONE FURO-FORMOTEROL FUM 200-5 MCG/ACT IN AERO
2.0000 | INHALATION_SPRAY | Freq: Two times a day (BID) | RESPIRATORY_TRACT | Status: DC
  Administered 2022-11-22 – 2022-11-23 (×3): 2 via RESPIRATORY_TRACT
  Filled 2022-11-21: qty 8.8

## 2022-11-21 MED ORDER — ALBUTEROL SULFATE (2.5 MG/3ML) 0.083% IN NEBU
5.0000 mg | INHALATION_SOLUTION | Freq: Once | RESPIRATORY_TRACT | Status: AC
  Filled 2022-11-21: qty 6

## 2022-11-21 MED ORDER — ACETAMINOPHEN 650 MG RE SUPP: 650.0000 mg | Freq: Four times a day (QID) | RECTAL | Status: DC | PRN

## 2022-11-21 MED ORDER — ACETAMINOPHEN 325 MG PO TABS
650.0000 mg | ORAL_TABLET | Freq: Four times a day (QID) | ORAL | Status: DC | PRN
  Administered 2022-11-21 – 2022-11-22 (×2): 650 mg via ORAL
  Filled 2022-11-21 (×2): qty 2

## 2022-11-21 MED ORDER — IOHEXOL 350 MG/ML SOLN
75.0000 mL | Freq: Once | INTRAVENOUS | Status: AC | PRN
  Administered 2022-11-21: 75 mL via INTRAVENOUS

## 2022-11-21 MED ORDER — IPRATROPIUM-ALBUTEROL 0.5-2.5 (3) MG/3ML IN SOLN
3.0000 mL | RESPIRATORY_TRACT | Status: DC | PRN
  Administered 2022-11-22 (×2): 3 mL via RESPIRATORY_TRACT
  Filled 2022-11-21 (×3): qty 3
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
## 2022-11-21 NOTE — ED Provider Triage Note (Signed)
Emergency Medicine Provider Triage Evaluation Note  Richard [name] , a 62 y.o. male  was evaluated in triage.  Pt complains of chest pain and shortness of breath.  Symptoms began shortly after being discharged from the hospital 2 days ago for right lower extremity peripheral stenting.  Chest pain is described as sharp and tight.  It is also pleuritic in nature.  Does have a history of asthma but states that it does not feel like an asthma exacerbation. also has episodes of lightheadedness.  Denies cardiac history.  Review of Systems  Positive: As above Negative: As above  Physical Exam  BP (!) 143/84 (BP Location: Right Arm)   Pulse 93   Temp 98.4 F (36.9 C)   Resp 18   SpO2 99%  Gen:   Awake, moderate distress   Resp:  Increased effort, expiratory wheezing MSK:   Moves extremities without difficulty  Other:    Medical Decision Making  Medically screening exam initiated at 10:49 AM.  Appropriate orders placed.  Richard [name] was informed that the remainder of the evaluation will be completed by another provider, this initial triage assessment does not replace that evaluation, and the importance of remaining in the ED until their evaluation is complete.  High suspicion for PE.  ACS labs and PE scan ordered   Richard [name], Richard [name] 11/21/22 1051

## 2022-11-21 NOTE — ED Triage Notes (Signed)
Patient arrives complaining of shortness of breath and chest pain that started two days ago. Patient speaking in short, one-word answers and mouthing that he cannot breath, loud audible wheezing. During initial assessment patient held out an albuterol inhaler, but stated he was unable to breath enough to use the inhaler. Patient placed on nebulizer mask with duoneb.  After duoneb, patient states he had outpatient surgery on his right on his right on 2/12. Patient states chest pain started after he left from the surgery. Patient is alert, oriented, speaking in complete sentences, and is in no apparent distress.

## 2022-11-21 NOTE — Hospital Course (Addendum)
Richard [name] is a 62 y.o. person living with a history of COPD/asthma, HTN, T2DM c/b diabetic neuropathy, CAD, PAD s/p RLE stent who presented with dyspnea and chest pain and admitted for acute exacerbation of COPD with asthma.   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 CTA negative for PE. Troponin WNL. Currently satting well on RA. Home medications listed are albuterol, dulera, singuilair, and trelegy, and Breztri. Plan from pulm f/u visit in 09/2022 was to stop spiriva/singuilair, start taking Beztri, and continue w/ albuterol. Pulm was also considering initiating biologic therapy if no improvement given eosinophilic asthma (options included dupixent, nucala, fasenra). Patient reports using albuterol, albuterol nebulizer, Dulera, and Trelegy inhaler at home.  Discussed current hospitalization for COPD with patient's pulmonologist Dr. Verlee Monte today who states that unfortunately we'll have to wait before starting biologic therapy as it usually takes 1-2 weeks for patient assistance programs to get approved for the order. Dr. Verlee Monte stated he will see that this process gets started. Currently satting well on room air. Diffuse wheezes on exam.  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 Home medications include Synjardy 123456 mg BID, Trulicity 3 g weekly and Actos 30 mg daily. CBGs in the 200s last night. Fasting glucose in the low 100s today.  -Continue home Synjardy  -Increase SSI to resistant w/ QHS coverage -Continue home pregabalin 100 mg TID   #Nonobstructive CAD #PAD s/p recent RLE angiogram and stent RLE angiogram with stent placement by IR on 2/12. Discharged on ASA 81 mg and Plavix 75 mg. Patient reports  compliance with these medications.  -Continue ASA and Plavix  -F/u w/ IR in 4 weeks    #HLD #HTN BP remains WNL. No antihypertensives at home. On atorvastatin 40 mg daily at home.  -Continue home atorvastatin 40 mg daily   #Aortic stenosis, mild - Continue f/u w/ cardiology as outpatient   #Tobacco use Quit smoking 6 months ago. Was taking Chantix but stopped 3-4 months ago. Denies cravings at this time.  -Nicotine patch as necessary   #Right plantar callus #Eczema Follows w/ podiatry outpatient. No signs of infection of the skin at this time. On exam, has chronic dry skin changes with scratch marks on the right lateral neck diffusely on bilateral upper extremities consistent w/ eczema. He reports that he often feels itchy in these areas.  -Continue f/u w/ podiatry as outpatient -Cetaphil lotion for eczema  --------------------------------------- 2/16:  Patient evaluated  in his room, seated beside his bed. Reports he has had SOB and CP, most prominent on right side. Patient suspects he may have a peanut allergy, describes experiencing a reaction the past three times he has attempted to eat peanut butter, he will experiencing itchiness, swelling in his mouth. Patient educated on avoiding any foods that trigger these kinds of symptoms. Denies abdominal pain. Reports itching in his neck has improved with topical creams. Overall reports he feels improved, ready to go home.

## 2022-11-21 NOTE — ED Notes (Signed)
Patient transported to CT 

## 2022-11-21 NOTE — ED Provider Notes (Signed)
Received patient in turnover from Dr. Matilde Sprang.  Please see their note for further details of Hx, PE.  Briefly patient is a 62 y.o. male with a Respiratory Distress .  Patient reportedly had difficulty breathing, chest tightness.  Workup negative for ACS thought to be most likely due to his asthma.  He was given 3 DuoNebs back-to-back and then a continuous of albuterol.  He is given Solu-Medrol.  On my reassessment the patient is still diminished in all fields has prolonged expiratory effort.  He could benefit from hospital admission and continued beta agonist therapy.  Will discuss with medicine for admission.  CRITICAL CARE Performed by: Cecilio Asper   Total critical care time: 35 minutes  Critical care time was exclusive of separately billable procedures and treating other patients.  Critical care was necessary to treat or prevent imminent or life-threatening deterioration.  Critical care was time spent personally by me on the following activities: development of treatment plan with patient and/or surrogate as well as nursing, discussions with consultants, evaluation of patient's response to treatment, examination of patient, obtaining history from patient or surrogate, ordering and performing treatments and interventions, ordering and review of laboratory studies, ordering and review of radiographic studies, pulse oximetry and re-evaluation of patient's condition.     Richard Etienne, DO 11/21/22 1734

## 2022-11-22 ENCOUNTER — Encounter: Payer: Self-pay | Admitting: Internal Medicine

## 2022-11-22 ENCOUNTER — Other Ambulatory Visit (HOSPITAL_COMMUNITY): Payer: Self-pay

## 2022-11-22 DIAGNOSIS — J441 Chronic obstructive pulmonary disease with (acute) exacerbation: Secondary | ICD-10-CM | POA: Diagnosis not present

## 2022-11-22 DIAGNOSIS — Z87891 Personal history of nicotine dependence: Secondary | ICD-10-CM | POA: Diagnosis not present

## 2022-11-22 DIAGNOSIS — R079 Chest pain, unspecified: Secondary | ICD-10-CM

## 2022-11-22 DIAGNOSIS — J8283 Eosinophilic asthma: Secondary | ICD-10-CM | POA: Diagnosis not present

## 2022-11-22 DIAGNOSIS — L309 Dermatitis, unspecified: Secondary | ICD-10-CM | POA: Insufficient documentation

## 2022-11-22 LAB — GLUCOSE, CAPILLARY
Glucose-Capillary: 343 mg/dL — ABNORMAL HIGH (ref 70–99)
Glucose-Capillary: 122 mg/dL — ABNORMAL HIGH (ref 70–99)
Glucose-Capillary: 340 mg/dL — ABNORMAL HIGH (ref 70–99)
Glucose-Capillary: 203 mg/dL — ABNORMAL HIGH (ref 70–99)

## 2022-11-22 LAB — BASIC METABOLIC PANEL
Anion gap: 11 (ref 5–15)
BUN: 17 mg/dL (ref 8–23)
CO2: 23 mmol/L (ref 22–32)
Calcium: 9.1 mg/dL (ref 8.9–10.3)
Chloride: 100 mmol/L (ref 98–111)
Creatinine, Ser: 1.07 mg/dL (ref 0.61–1.24)
GFR, Estimated: >60 mL/min (ref >60)
Glucose, Bld: 248 mg/dL — ABNORMAL HIGH (ref 70–99)
Potassium: 4.5 mmol/L (ref 3.5–5.1)
Sodium: 134 mmol/L — ABNORMAL LOW (ref 135–145)

## 2022-11-22 LAB — CBC
HCT: 39.4 % (ref 39.0–52.0)
Hemoglobin: 12.9 g/dL — ABNORMAL LOW (ref 13.0–17.0)
MCH: 26.4 pg (ref 26.0–34.0)
MCHC: 32.7 g/dL (ref 30.0–36.0)
MCV: 80.7 fL (ref 80.0–100.0)
Platelets: 274 10*3/uL (ref 150–400)
RBC: 4.88 MIL/uL (ref 4.22–5.81)
RDW: 14.5 % (ref 11.5–15.5)
WBC: 6.2 10*3/uL (ref 4.0–10.5)
nRBC: 0 % (ref 0.0–0.2)

## 2022-11-22 LAB — LIPID PANEL
Cholesterol: 165 mg/dL (ref 0–200)
HDL: 78 mg/dL (ref >40)
LDL Cholesterol: 80 mg/dL (ref 0–99)
Total CHOL/HDL Ratio: 2.1 RATIO
Triglycerides: 34 mg/dL (ref <150)
VLDL: 7 mg/dL (ref 0–40)

## 2022-11-22 MED ORDER — INSULIN ASPART 100 UNIT/ML IJ SOLN
0.0000 [IU] | Freq: Three times a day (TID) | INTRAMUSCULAR | Status: DC
  Administered 2022-11-22: 15 [IU] via SUBCUTANEOUS
  Administered 2022-11-23: 7 [IU] via SUBCUTANEOUS
  Administered 2022-11-23: 15 [IU] via SUBCUTANEOUS

## 2022-11-22 MED ORDER — CETAPHIL MOISTURIZING EX LOTN
TOPICAL_LOTION | Freq: Every day | CUTANEOUS | Status: DC
  Filled 2022-11-22: qty 473

## 2022-11-22 MED ORDER — IPRATROPIUM-ALBUTEROL 0.5-2.5 (3) MG/3ML IN SOLN
3.0000 mL | Freq: Four times a day (QID) | RESPIRATORY_TRACT | Status: DC
  Administered 2022-11-22 – 2022-11-23 (×4): 3 mL via RESPIRATORY_TRACT
  Filled 2022-11-22 (×5): qty 3

## 2022-11-22 MED ORDER — ALBUTEROL SULFATE (2.5 MG/3ML) 0.083% IN NEBU
2.5000 mg | INHALATION_SOLUTION | RESPIRATORY_TRACT | Status: DC | PRN
  Administered 2022-11-23: 2.5 mg via RESPIRATORY_TRACT
  Filled 2022-11-22: qty 3

## 2022-11-22 MED ORDER — GUAIFENESIN 100 MG/5ML PO LIQD
5.0000 mL | ORAL | Status: DC | PRN
  Administered 2022-11-22: 5 mL via ORAL
  Filled 2022-11-22: qty 5

## 2022-11-22 MED ORDER — INSULIN ASPART 100 UNIT/ML IJ SOLN
0.0000 [IU] | Freq: Every day | INTRAMUSCULAR | Status: DC
  Administered 2022-11-22: 2 [IU] via SUBCUTANEOUS

## 2022-11-22 MED ORDER — LIDOCAINE 5 % EX PTCH
1.0000 | MEDICATED_PATCH | Freq: Every day | CUTANEOUS | Status: DC
  Administered 2022-11-22 (×2): 1 via TRANSDERMAL
  Filled 2022-11-22 (×2): qty 1

## 2022-11-22 NOTE — Progress Notes (Signed)
Notified by RT that pt said he might be allergic to peanut butter. Went to the pt room, pt just received his Neb treatment. Pt stated he felt SOB shortly after he ate peanut butter and he think he might have developed allergic reaction from that. Pt stated he did not have allergic reaction to peanut butter before.  VS taken, assessment preformed, pt was having Bilat. expiratory wheezing and no SOB noted. No rash, swelling or redness noted on pt neck or throat. Pt complained of right chest pain. 7/10. He stated his pain developed after he started taking the Neb treatment by the RT.  Tylenol was given for pain and scheduled Lidocaine patch applied. On call provider notified who stated to continue to monitor the pt.

## 2022-11-22 NOTE — Progress Notes (Signed)
NEW ADMISSION NOTE New Admission Note:   Arrival Method: Stretcher from ED Mental Orientation: Alert and oriented x4 Telemetry:  Box 4N [id number] Assessment: Completed Skin: Abrasions on feet bilaterally, left lower extremity- toe amputations IV: Left A.C., saline locked Pain: 4/10, given pain medication Tubes: None Safety Measures: Safety Fall Prevention Plan has been given, discussed and implemented. Admission: Initiated 6N Orientation: Patient has been orientated to the room, unit and staff.  Family: Significant other at the bedside  Orders have been reviewed and implemented. Will continue to monitor the patient. Call light has been placed within reach and bed alarm has been activated.   Sharmon Revere, RN

## 2022-11-22 NOTE — Progress Notes (Signed)
   11/22/22 1505  Mobility  Activity Ambulated independently in hallway  Level of Assistance Independent  Assistive Device None  Distance Ambulated (ft) 250 ft  Activity Response Tolerated well  Mobility Referral Yes  $Mobility charge 1 Mobility   Mobility Specialist Progress Note  Pt in bed and agreeable. X1 standing break d/t feeling SOB. Returned to bed w/ all needs met and call bell in reach.   [name] Mobility Specialist  Please contact via SecureChat or Rehab office at [phone]

## 2022-11-22 NOTE — Plan of Care (Signed)
  Problem: Coping: Goal: Ability to adjust to condition or change in health will improve Outcome: Progressing   Problem: Education: Goal: Knowledge of General Education information will improve Description: Including pain rating scale, medication(s)/side effects and non-pharmacologic comfort measures Outcome: Progressing   Problem: Health Behavior/Discharge Planning: Goal: Ability to manage health-related needs will improve Outcome: Progressing

## 2022-11-22 NOTE — Progress Notes (Signed)
Subjective:   Continues to have some wheezing and shortness of breath but notes that this is improved from the time of his admission.  Endorses intermittent diffuse chest pain over the last few years that he describes as an " sitting on his chest".  He states that his chest pain worsens with coughing and sitting up from bed.  Reports that he has been taking aspirin 81 mg and Plavix at home regularly.  Reports taking albuterol, albuterol nebulizer, Dulera, and Trelegy inhaler at home.  He states that he was previously using [place] states that this medication was discontinued.  He is not experiencing headache anymore with Trelegy.  When areas of dry skin and scratch marks were noted on his neck and diffusely on bilateral upper extremities, patient reports that he often feels itchy.  He is uncertain of any allergies other than seasonal allergies.  Objective:  Vital signs in last 24 hours: Vitals:   11/21/22 2233 11/22/22 0023 11/22/22 0418 11/22/22 0650  BP: 139/80 116/68 122/72   Pulse: (!) 106 (!) 101 89   Resp: 20 20 18   $ Temp: 97.9 F (36.6 C) 98.4 F (36.9 C) 98.2 F (36.8 C)   TempSrc: Oral Oral Oral   SpO2: 100% 94% 98%   Height:    6' 3"$  (1.905 m)   Physical Exam: Constitutional: well-appearing male sitting up in bed, not in acute distress Cardiovascular: regular rate, regular rhythm, 3/6 systolic murmur at the RUSB, 2+ dorsalis pedis pulses bilaterally Pulmonary/Chest: normal work of breathing on room air, diffuse expiratory wheezing in all lung fields Abdominal: soft, non-tender, non-distended MSK: normal bulk and tone Neurological: alert & oriented x 3 Skin: warm and dry, chronic dry skin changes with scratch marks on the right lateral neck diffusely on bilateral upper extremities Psych: pleasant mood, appropriate behavior  Assessment/Plan:  Principal Problem:   Acute exacerbation of COPD with asthma (HCC)   Richard [name] is a 62 y.o. person living with a history of  COPD/asthma, HTN, T2DM c/b diabetic neuropathy, CAD, PAD s/p RLE stent who presented with dyspnea and chest pain and admitted for acute exacerbation of COPD with asthma.   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 CTA negative for PE. Troponin WNL. Currently satting well on RA. Home medications listed are albuterol, dulera, singuilair, and trelegy, and Breztri. Plan from pulm f/u visit in 09/2022 was to stop spiriva/singuilair, start taking Beztri, and continue w/ albuterol. Pulm was also considering initiating biologic therapy if no improvement given eosinophilic asthma (options included dupixent, nucala, fasenra). Patient reports using albuterol, albuterol nebulizer, Dulera, and Trelegy inhaler at home.  Discussed current hospitalization for COPD with patient's pulmonologist Dr. Verlee Monte today who states that unfortunately we'll have to wait before starting biologic therapy as it usually takes 1-2 weeks for patient assistance programs to get approved for the order. Dr. Verlee Monte stated he will see that this process gets started. Currently satting well on room air. Diffuse wheezes on exam.  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 Home medications include Synjardy 123456 mg BID, Trulicity 3 g weekly and Actos 30 mg daily. CBGs in the 200s last night. Fasting glucose in the low 100s today.  -Continue home Synjardy  -Increase SSI to resistant w/ QHS coverage -  Continue home pregabalin 100 mg TID   #Nonobstructive CAD #PAD s/p recent RLE angiogram and stent RLE angiogram with stent placement by IR on 2/12. Discharged on ASA 81 mg and Plavix 75 mg. Patient reports compliance with these medications.  -Continue ASA and Plavix  -F/u  w/ IR in 4 weeks    #HLD #HTN BP remains WNL. No antihypertensives at home. On atorvastatin 40 mg daily at home.  -Continue home atorvastatin 40 mg daily  #Aortic stenosis, mild - Continue f/u w/ cardiology as outpatient   #Tobacco use Quit smoking 6 months ago. Was taking Chantix but stopped 3-4 months ago. Denies cravings at this time.  -Nicotine patch as necessary   #Right plantar callus #Eczema Follows w/ podiatry outpatient. No signs of infection of the skin at this time. On exam, has chronic dry skin changes with scratch marks on the right lateral neck diffusely on bilateral upper extremities consistent w/ eczema. He reports that he often feels itchy in these areas.  -Continue f/u w/ podiatry as outpatient -Cetaphil lotion for eczema   Diet: CM Bowel: senna VTE: Xarelto IVF: none Code: FULL PT/OT recs: none   Prior to Admission Living Arrangement: home alone Anticipated Discharge Location: TBD Barriers to Discharge: continued management Dispo: Anticipated discharge in approximately less than 2 day(s).   Starlyn Skeans, MD 11/22/2022, 6:51 AM Pager: [phone] After 5pm on weekdays and 1pm on weekends: On Call pager [phone]

## 2022-11-22 NOTE — Inpatient Diabetes Management (Signed)
Inpatient Diabetes Program Recommendations  AACE/ADA: New Consensus Statement on Inpatient Glycemic Control (2015)  Target Ranges:  Prepandial:   less than 140 mg/dL      Peak postprandial:   less than 180 mg/dL (1-2 hours)      Critically ill patients:  140 - 180 mg/dL   Lab Results  Component Value Date   GLUCAP 343 (H) 11/22/2022   HGBA1C 10.4 (A) 09/27/2022    Review of Glycemic Control  Latest Reference Range & Units 11/21/22 22:39 11/22/22 07:48  Glucose-Capillary 70 - 99 mg/dL 375 (H) 343 (H)   Diabetes history: DM 2 Outpatient Diabetes medications: Trulicity 3 mg QSunday, Synjardy 12.02-999 mg bid, Actos 30 mg Daily Current orders for Inpatient glycemic control:  Metformin 1000 mg bid Novolog 0-15 units tid + hs Jardiance 10 mg bid  Solumedrol 125 mg x1 dose now on PO prednisone 40 gm Daily  Inpatient Diabetes Program Recommendations:    -  Consider Semglee 8 units while on steroids -  start Novolog 3 units tid meal coverage if pt eating >50% of meals.  Thanks,  Tama Headings RN, MSN, BC-ADM Inpatient Diabetes Coordinator Team Pager [phone] (8a-5p)

## 2022-11-22 NOTE — TOC Benefit Eligibility Note (Signed)
Patient Teacher, English as a foreign language completed.    The patient is currently admitted and upon discharge could be taking Farxiga 10 mg.  The current 30 day co-pay is $0.00.   The patient is currently admitted and upon discharge could be taking Jardiance 10 mg.  The current 30 day co-pay is $0.00.   The patient is insured through [place], [place] Patient Advocate Specialist [place] Patient Advocate Team Direct Number: [phone]  Fax: [phone]

## 2022-11-22 NOTE — Progress Notes (Signed)
Nurse requested Mobility Specialist to perform oxygen saturation test with pt which includes removing pt from oxygen both at rest and while ambulating.  Below are the results from that testing.     Patient Saturations on Room Air at Rest = spO2 97%  Patient Saturations on Room Air while Ambulating = sp02 92% .  Rested and performed pursed lip breathing for 1 minute with sp02 at 92%.  Patient Saturations on 0 Liters of oxygen while Ambulating = sp02 92%  At end of testing pt left in room on 0  Liters of oxygen.  Reported results to nurse.

## 2022-11-23 ENCOUNTER — Other Ambulatory Visit (HOSPITAL_COMMUNITY): Payer: Self-pay

## 2022-11-23 LAB — GLUCOSE, CAPILLARY
Glucose-Capillary: 245 mg/dL — ABNORMAL HIGH (ref 70–99)
Glucose-Capillary: 333 mg/dL — ABNORMAL HIGH (ref 70–99)

## 2022-11-23 MED ORDER — PREDNISONE 20 MG PO TABS
40.0000 mg | ORAL_TABLET | Freq: Every day | ORAL | 0 refills | Status: DC
  Filled 2022-11-23: qty 4, 2d supply, fill #0

## 2022-11-23 NOTE — Discharge Instructions (Addendum)
You were hospitalized for COPD exacerbation.  Hospital Course: We gave you medications and steroids to help with your breathing. We will continue with 2 days of steroids outside of the hospital. Please follow up with the internal medicine clinic and your lung doctor to discuss plans for future treatment of your asthma and COPD.    Medications:  Please start taking: -Prednisone 20 mg (take 2 tablets daily (40 mg total) for 2 days)  Please stop taking: -Breztri aero  Please continue taking: -Ipratropium-albuterol nebulizer -Albuterol nebulizer -Albuterol inhaler -Trelegy inhaler -Singulair 10 mg daily -Aspirin 81 mg daily -Atorvastatin 40 mg daily -Plavix 75 mg daily -Flonase nasal spray -Multivitamin -Vitamin C 1000 mg daily -Nitroglycerin patch -Pioglitazone 30 mg daily -Lyrica 100 mg three times daily -Synjardy 12.02-999 mg twice daily -Trulicity 3 mg weekly   Follow-up: - Please follow up with your primary care provider at the [facility] on 11/28/2022 at 1:45 PM Phone: [phone] Address: [facility], [place], [place], [place] [address]  - Please follow up with pulmonologist Dr. Verlee Monte, Hortencia Conradi, MD at [place] at [facility] (please call to schedule an appointment) Phone: [phone] Address: [address], [address], [place], [place] [address]  - Please follow up with interventional radiology Dr. Corrie Mckusick, DO at [place] at [facility] (please call to schedule an appointment) Phone: [phone] Address: [address], [place], [place], [place] [address]

## 2022-11-23 NOTE — Inpatient Diabetes Management (Addendum)
Inpatient Diabetes Program Recommendations  AACE/ADA: New Consensus Statement on Inpatient Glycemic Control (2015)  Target Ranges:  Prepandial:   less than 140 mg/dL      Peak postprandial:   less than 180 mg/dL (1-2 hours)      Critically ill patients:  140 - 180 mg/dL   Lab Results  Component Value Date   GLUCAP 245 (H) 11/23/2022   HGBA1C 10.4 (A) 09/27/2022    Review of Glycemic Control  Latest Reference Range & Units 11/22/22 07:48 11/22/22 12:14 11/22/22 17:56 11/22/22 19:53 11/23/22 07:29  Glucose-Capillary 70 - 99 mg/dL 343 (H) 122 (H) 340 (H) 203 (H) 245 (H)    Diabetes history: DM 2 Outpatient Diabetes medications: Trulicity 3 mg QSunday, Synjardy 12.02-999 mg bid, Actos 30 mg Daily Current orders for Inpatient glycemic control:  Metformin 1000 mg bid Novolog 0-20 units tid + hs Jardiance 10 mg bid  PO Prednisone 40 mg Daily  Inpatient Diabetes Program Recommendations:    -  Consider Semglee 8 units while on steroids -  start Novolog 3 units tid meal coverage if pt eating >50% of meals.  SPoke with pt at bedside regarding his A1c of 10.4% on 12/21. Spoke with pt about possibly needing insulin due to hyperglycemia due to steroids. Pt reports eating a lot of carbs and fruit. 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 Pt requests information on diet. Discussed glucose and A1c goals. Encouraged follow up with PCP. Pt reports seeing Dr. Heber [place] every 3 months for glucose control. Explained how insulin is similar operation to his Trulicity medication at home.   Thanks,  Tama Headings RN, MSN, BC-ADM Inpatient Diabetes Coordinator Team Pager [phone] (8a-5p)

## 2022-11-23 NOTE — Discharge Summary (Signed)
Name: Richard [name] MRN: [id number] DOB: 18-Aug-1961 62 y.o. PCP: [name], DO  Date of Admission: 11/21/2022 10:28 AM Date of Discharge: 11/23/2022 Attending Physician: Dr. Dorian Pod  Discharge Diagnosis: 1. Principal Problem:   Acute exacerbation of COPD with asthma ([place]) Active Problems:   Uncontrolled type 2 diabetes mellitus with hyperglycemia, without long-term current use of insulin (HCC)   Anxiety   Eczema   Nonobstructive CAD   PAD   HLD   Mild Aortic Stenosis  Discharge Medications: Allergies as of 11/23/2022       Reactions   Shellfish Allergy Anaphylaxis, Shortness Of Breath        Medication List     STOP taking these medications    Breztri Aerosphere 160-9-4.8 MCG/ACT Aero Generic drug: Budeson-Glycopyrrol-Formoterol       TAKE these medications    albuterol (2.5 MG/3ML) 0.083% nebulizer solution Commonly known as: PROVENTIL Take 2.5 mg by nebulization 2 (two) times daily.   albuterol 108 (90 Base) MCG/ACT inhaler Commonly known as: VENTOLIN HFA Inhale 1-2 puffs into the lungs every 4 (four) hours as needed for wheezing or shortness of breath.   aspirin EC 81 MG tablet Take 1 tablet (81 mg total) by mouth daily.   atorvastatin 40 MG tablet Commonly known as: LIPITOR Take 1 tablet (40 mg total) by mouth daily.   clopidogrel 75 MG tablet Commonly known as: Plavix Take 1 tablet (75 mg total) by mouth daily.   fluticasone 50 MCG/ACT nasal spray Commonly known as: FLONASE Place 1 spray into both nostrils daily. What changed:  when to take this reasons to take this   ipratropium-albuterol 0.5-2.5 (3) MG/3ML Soln Commonly known as: DUONEB Take 3 mLs by nebulization every 6 (six) hours as needed (for wheezing not relieved by albuterol inhaler).   montelukast 10 MG tablet Commonly known as: SINGULAIR Take 1 tablet (10 mg total) by mouth at bedtime.   Multivitamin Men 50+ Tabs Take 1 tablet by mouth daily.   nitroGLYCERIN  0.2 mg/hr patch Commonly known as: NITRODUR - Dosed in mg/24 hr Place 1 patch (0.2 mg total) onto the skin daily.   pioglitazone 30 MG tablet Commonly known as: ACTOS Take 1 tablet (30 mg total) by mouth daily.   predniSONE 20 MG tablet Commonly known as: DELTASONE Take 2 tablets (40 mg total) by mouth daily with breakfast. Start taking on: November 24, 2022   pregabalin 100 MG capsule Commonly known as: LYRICA Take 1 capsule (100 mg total) by mouth 3 (three) times daily.   Synjardy 12.02-999 MG Tabs Generic drug: Empagliflozin-metFORMIN HCl Take 1 tablet by mouth 2 (two) times daily.   Trelegy Ellipta 100-62.5-25 MCG/ACT Aepb Generic drug: Fluticasone-Umeclidin-Vilant Inhale 1 puff into the lungs in the morning and at bedtime.   Trulicity 3 0000000 Sopn Generic drug: Dulaglutide Inject 3 mg as directed once a week. What changed:  how to take this when to take this   vitamin C 1000 MG tablet Take 1,000 mg by mouth daily.        Disposition and follow-up:   RichardJousha [name] was discharged from [facility] in Good condition.  At the hospital follow up visit please address:  1.    A. 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  Labs / imaging needed at time of follow-up: CBG  3.  Pending labs/ test needing follow-up: none  Follow-up Appointments: - Please follow up with your primary care provider at the [facility] on 11/28/2022 at 1:45 PM Phone: [phone] Address: [facility], [place], [place], [place] [address]   - Please follow up with  pulmonologist Dr. Verlee Monte, Hortencia Conradi, MD at [place] at [facility] (please call to schedule an appointment) Phone: [phone] Address: [address] [place], [place], [place] [address]   - Please follow up with interventional radiology Dr. Corrie Mckusick, DO at [place] at [facility] (please call to schedule an appointment) Phone: [phone] Address: [address], [place], [place],  [address]  Hospital Course by problem list:  Richard [name] is a 62 y.o. person living with a history of COPD/asthma, HTN, T2DM c/b diabetic neuropathy, CAD, PAD s/p RLE stent who presented with dyspnea and chest pain and admitted for acute exacerbation of COPD with asthma.   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 Plan from pulm f/u visit in 09/2022 was to continue albutero, stop spiriva/singuilair, and start taking Beztri. Pulm was also considering initiating biologic therapy if no improvement given eosinophilic asthma (options included dupixent, nucala, fasenra). However, patient reported using albuterol, albuterol nebulizer, Dulera, and Trelegy inhaler at home. Discussed current hospitalization for COPD with patient's pulmonologist Dr. Verlee Monte who stated he will get the process of being approved for starting biologic therapy started (could take 1-2 weeks). Patient was treated w/ duonebs, dulera, incruse ellipta, and prednisone while hospitalized. Did not require supplemental oxygen with ambulation. Discharged patient on albuterol, albuterol nebulizer, Trelegy inhaler, singulair, and 2 days of prednisone. Patient will f/u with pulmonology as outpatient regarding starting biologic therapy.   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 Was taking Synjardy 123456 mg BID, Trulicity 3 g weekly and Actos 30 mg daily at home.   Treated w/ synjardy and SSI while hospitalized. Also gave home pregabalin. Did have elevated CBGs near the time of discharge that we suspect were secondary to current steroid use. Discharged on home synjardy, trulicity, actos, and pregabalin. Patient will finish his course of steroids in 2 days. He is scheduled for f/u in clinic next week to assess need for changes to his diabetic regimen once steroids have stopped.    #Nonobstructive CAD #PAD s/p recent RLE angiogram and stent RLE angiogram w/ stent placement by IR on 2/12. Was taking ASA 81 mg and Plavix 75 mg at home. Continued these medications throughout his hospitalization and at discharge. Patient has f/u w/ IR as outpatient.    #HLD #HTN Was not taking antihypertensives at home. BP remained WNL, no antihypertensives were started. Was taking atorvastatin 40 mg daily at home. Continued atorvastatin throughout his hospital stay and at discharge.    #Aortic stenosis, mild 3/6 systolic murmur noted at the RUSB. Plan to continue f/u w/ cardiology as outpatient.    #Tobacco use Reported that he quit smoking 6 months ago. Was taking Chantix but stopped taking this medication 3-4 months ago. No cravings reported while here. Could consider additional pharmacologic options for smoking cessation as outpatient if cravings return.   #Right plantar callus #Eczema  Follows w/ podiatry as outpatient. Had no signs of infection of the skin. Plan to f/u with podiatry outpatient as needed. Did have chronic dry skin changes of the right neck and diffusely along bilateral upper extremities concerning for eczema. Started cetaphil lotion.  Discharge Exam:   BP 128/75 (BP Location: Right Arm)   Pulse 89   Temp 98.3 F (36.8 C) (Oral)   Resp 17   Ht 6' 3"$  (1.905 m)   SpO2 99%   BMI 29.37 kg/m  Constitutional: well-appearing male sitting in his chair, not in acute distress Cardiovascular: regular rate, regular rhythm, 3/6 systolic murmur at the RUSB, 2+  dorsalis pedis pulses bilaterally Pulmonary/Chest: normal work of breathing on room air, no wheezes or crackles Abdominal: soft, non-tender, non-distended MSK: normal bulk and tone Neurological: alert & oriented x 3 Skin: warm and dry, chronic dry skin changes with scratch marks on the right lateral neck diffusely on bilateral upper extremities Psych: pleasant mood  Pertinent Labs, Studies, and Procedures:     Latest Ref Rng & Units 11/22/2022    3:46 AM 11/21/2022   10:46 AM 11/19/2022   10:29 AM  CBC  WBC 4.0 - 10.5 K/uL 6.2  4.5  4.7   Hemoglobin 13.0 - 17.0 g/dL 12.9  13.0  13.2   Hematocrit 39.0 - 52.0 % 39.4  41.7  41.8   Platelets 150 - 400 K/uL 274  272  251        Latest Ref Rng & Units 11/22/2022    3:46 AM 11/21/2022   10:46 AM 11/19/2022   10:29 AM  BMP  Glucose 70 - 99 mg/dL 248  211  200   BUN 8 - 23 mg/dL 17  12  13   $ Creatinine 0.61 - 1.24 mg/dL 1.07  1.00  0.93   Sodium 135 - 145 mmol/L 134  135  137   Potassium 3.5 - 5.1 mmol/L 4.5  4.4  4.6   Chloride 98 - 111 mmol/L 100  101  106   CO2 22 - 32 mmol/L 23  22  20   $ Calcium 8.9 - 10.3 mg/dL 9.1  9.5  9.0     CT Angio Chest PE W/Cm &/Or Wo Cm  IMPRESSION: 1. No evidence of pulmonary embolism. No acute intrathoracic process. 2. Aortic Atherosclerosis (ICD10-I70.0) and Emphysema (ICD10-J43.9).   On: 11/21/2022 14:43  Discharge Instructions: Discharge Instructions     Call MD for:  difficulty breathing, headache or visual disturbances   Complete by: As directed    Call MD for:  extreme fatigue   Complete by: As directed    Call MD for:  persistant dizziness or light-headedness   Complete by: As directed    Call MD for:  severe uncontrolled pain   Complete by: As directed    Diet - low sodium heart healthy   Complete by: As directed    Increase activity slowly   Complete by: As directed       You were hospitalized for COPD exacerbation.   Hospital Course: We gave you medications and steroids to  help with your breathing. We will continue with 2 days of steroids outside of the hospital. Please follow up with the internal medicine clinic and your lung doctor to discuss plans for future treatment of your asthma and COPD.      Medications:   Please start taking: -Prednisone 20 mg (take 2 tablets daily (40 mg total) for 2 days)   Please stop taking: Judithann Sauger  aero -Dulera   Please continue taking: -Ipratropium-albuterol nebulizer -Albuterol nebulizer -Albuterol inhaler -Trelegy inhaler -Singulair 10 mg daily -Aspirin 81 mg daily -Atorvastatin 40 mg daily -Plavix 75 mg daily -Flonase nasal spray -Multivitamin -Vitamin C 1000 mg daily -Nitroglycerin patch -Pioglitazone 30 mg daily -Lyrica 100 mg three times daily -Synjardy 12.02-999 mg twice daily -Trulicity 3 mg weekly     Follow-up: - Please follow up with your primary care provider at the [facility] on 11/28/2022 at 1:45 PM Phone: [phone] Address: [facility], [place], [place], [place] [address]   - Please follow up with pulmonologist Dr. Verlee Monte, Hortencia Conradi, MD at [place] at [facility] (please call to schedule an appointment) Phone: [phone] Address: [address], [place], [place], [place] [address]   - Please follow up with interventional radiology Dr. Corrie Mckusick, DO at [place] at [facility] (please call to schedule an appointment) Phone: [phone] Address: [address] [place], [place], [place] [address]  Signed: Starlyn Skeans, MD 11/23/2022, 4:55 PM   Pager: [phone]

## 2022-11-26 ENCOUNTER — Telehealth: Payer: Self-pay | Admitting: Student

## 2022-11-26 NOTE — Telephone Encounter (Signed)
Can we bring him back to clinic in 1-2 weeks with me or APP to discuss possibly starting biologic for eosinophilic asthma, ACOS?  Thanks!

## 2022-11-26 NOTE — Telephone Encounter (Signed)
-----   Message from Starlyn Skeans, MD sent at 11/22/2022  4:46 PM EST ----- Good afternoon Dr. Verlee Monte,  This is the patient we discussed starting dupixen vs nucala vs fasenra to treat severe persistent, eosinophilic asthma, but would require 1-2 weeks for patient assistance programs to get approved. Thank you for your help.  Best wishes,  Starlyn Skeans

## 2022-11-27 ENCOUNTER — Telehealth: Payer: Self-pay | Admitting: Podiatry

## 2022-11-27 NOTE — Progress Notes (Signed)
CC: Hospital follow up  HPI:  [name] is a 62 y.o. with medical history of COPD/asthma, HTN, T2DM c/b diabetic neuropathy, CAD, PAD s/p RLE stent  presenting to [facility] for hospital follow up. Pt in the hospital for acute hypoxic RF 2/2 to COPD/Asthma exacerbation from 02/14-02/16.   Please see problem-based list for further details, assessments, and plans.  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 (Patient taking differently: Inject 3 mg into the skin every Sunday.)   Empagliflozin-metFORMIN HCl (SYNJARDY) 12.02-999 MG TABS, Take 1 tablet by mouth 2 (two) times daily.   pioglitazone (ACTOS) 30 MG tablet, Take 1 tablet (30 mg total) by mouth daily.   predniSONE (DELTASONE) 20 MG tablet, Take 2 tablets (40 mg total) by mouth daily with breakfast.  Current Outpatient Medications (Cardiovascular):    atorvastatin (LIPITOR) 40 MG tablet, Take 1 tablet (40 mg total) by mouth daily.   nitroGLYCERIN (NITRODUR - DOSED IN MG/24 HR) 0.2 mg/hr patch, Place 1 patch (0.2 mg total) onto the skin daily. (Patient  not taking: Reported on 11/22/2022)  Current Outpatient Medications (Respiratory):    albuterol (PROVENTIL) (2.5 MG/3ML) 0.083% nebulizer solution, Take 2.5 mg by nebulization 2 (two) times daily.   albuterol (VENTOLIN HFA) 108 (90 Base) MCG/ACT inhaler, Inhale 1-2 puffs into the lungs every 4 (four) hours as needed for wheezing or shortness of breath.   fluticasone (FLONASE) 50 MCG/ACT nasal spray, Place 1 spray into both nostrils daily. (Patient taking differently: Place 1 spray into both nostrils daily as needed for allergies.)   ipratropium-albuterol (DUONEB) 0.5-2.5 (3) MG/3ML SOLN, Take 3 mLs by nebulization every 6 (six) hours as needed (for wheezing not relieved by albuterol inhaler). (Patient not taking: Reported on 11/22/2022)   montelukast (SINGULAIR) 10 MG tablet, Take 1 tablet (10 mg total) by mouth at bedtime.   TRELEGY ELLIPTA 100-62.5-25 MCG/ACT AEPB, Inhale 1 puff into the lungs in the morning and at bedtime.  Current Outpatient Medications (Analgesics):    aspirin EC 81 MG EC tablet, Take 1 tablet (81 mg total) by mouth daily.  Current Outpatient Medications (Hematological):    clopidogrel (PLAVIX) 75 MG tablet, Take 1 tablet (75 mg total) by mouth daily.  Current Outpatient Medications (Other):    Ascorbic Acid (VITAMIN C) 1000 MG tablet, Take 1,000 mg by mouth daily.   Multiple Vitamins-Minerals (MULTIVITAMIN MEN 50+) TABS, Take 1 tablet by mouth daily.   pregabalin (LYRICA) 100 MG  capsule, Take 1 capsule (100 mg total) by mouth 3 (three) times daily.  Review of Systems:  Review of system negative unless stated in the problem list or HPI.    Physical Exam:  Vitals:   11/28/22 1407  BP: 108/67  Pulse: (!) 102  Temp: 98 F (36.7 C)  TempSrc: Oral  SpO2: 100%  Weight: 221 lb 11.2 oz (100.6 kg)  Height: '6\' 3"'$  (1.905 m)    Physical Exam General: NAD HENT: NCAT Lungs: minimal wheeze noted in RUL, rhonchi or rales.  Cardiovascular: Normal heart sounds, no r/m/g, 2+  pulses in all extremities. No LE edema Abdomen: No TTP, normal bowel sounds MSK: No asymmetry or muscle atrophy.  Skin: no lesions noted on exposed skin Neuro: Alert and oriented x4. CN grossly intact Psych: Normal mood and normal affect   Assessment & Plan:   Acute exacerbation of COPD with asthma ([place])  Pt recently hospitalized for acute exacerbation of COPD and asthma. Had multiple hospitalization for this last year. He was discharged on albuterol, albuterol nebulizer, Trelegy inhaler, singulair, and 2 days of prednisone. Pt completed prednisone. He is currently using Breztri inhaler, Dulera, and albuterol. His tachycardia appears 2/2 to using multiple inhalers. Reviewed pulmonology note states to use Breztri and albuterol. Advised pt to only use Breztri and albuterol. Pt has pulmonology follow up 12/05/22. He states he is using albuterol minimally. Lungs with good air movement with minimal wheeze in right upper lobe. Pt currently smoking so advised to abstain completely given his lung pathology.  -  Uncontrolled type 2 diabetes mellitus with hyperglycemia, without long-term current use of insulin (HCC) Pt has DMII. Last A1c was 10.4 two months ago. He was discharged on home synjardy, trulicity 3.0 weekly, pioglitazone 30 mg qd, and pregabalin 100 TID. Can increase up to 4.5 mg weekly if needed. Will continue current regimen give pt reporting good CBG readings. He states his CBG in the am: 147; highest 165, lowest 110.   Routine adult health maintenance ACR checked and normal. Referral to eye doctor made.    See Encounters Tab for problem based charting.  Patient discussed with Dr. Odella Aquas, MD Tillie Rung. [facility] Internal Medicine Residency, PGY-2

## 2022-11-27 NOTE — Telephone Encounter (Signed)
LMOM for patient to call back , his diabetic shoes are in needs appt to try on.    Expires 01/29/23

## 2022-11-28 ENCOUNTER — Ambulatory Visit (INDEPENDENT_AMBULATORY_CARE_PROVIDER_SITE_OTHER): Payer: 59 | Admitting: Internal Medicine

## 2022-11-28 ENCOUNTER — Encounter: Payer: Self-pay | Admitting: Internal Medicine

## 2022-11-28 ENCOUNTER — Other Ambulatory Visit: Payer: Self-pay

## 2022-11-28 VITALS — BP 108/67 | HR 102 | Temp 98.0°F | Ht 75.0 in | Wt 221.7 lb

## 2022-11-28 DIAGNOSIS — Z7984 Long term (current) use of oral hypoglycemic drugs: Secondary | ICD-10-CM

## 2022-11-28 DIAGNOSIS — Z0000 Encounter for general adult medical examination without abnormal findings: Secondary | ICD-10-CM

## 2022-11-28 DIAGNOSIS — J45901 Unspecified asthma with (acute) exacerbation: Secondary | ICD-10-CM

## 2022-11-28 DIAGNOSIS — E1165 Type 2 diabetes mellitus with hyperglycemia: Secondary | ICD-10-CM

## 2022-11-28 DIAGNOSIS — E1169 Type 2 diabetes mellitus with other specified complication: Secondary | ICD-10-CM

## 2022-11-28 DIAGNOSIS — I10 Essential (primary) hypertension: Secondary | ICD-10-CM | POA: Diagnosis not present

## 2022-11-28 DIAGNOSIS — E1151 Type 2 diabetes mellitus with diabetic peripheral angiopathy without gangrene: Secondary | ICD-10-CM | POA: Diagnosis not present

## 2022-11-28 DIAGNOSIS — Z794 Long term (current) use of insulin: Secondary | ICD-10-CM

## 2022-11-28 DIAGNOSIS — J441 Chronic obstructive pulmonary disease with (acute) exacerbation: Secondary | ICD-10-CM

## 2022-11-28 DIAGNOSIS — Z87891 Personal history of nicotine dependence: Secondary | ICD-10-CM

## 2022-11-28 NOTE — Patient Instructions (Signed)
Richard [name], it was a pleasure seeing you today! You endorsed feeling well today. Below are some of the things we talked about this visit. We look forward to seeing you in the follow up appointment!  Today we discussed: For your asthma/COPD, use only Breztri daily and albuterol as needed. Do not use trelegy or dulera. Your breathing is doing better. Please follow up with pulmonology. Refrain from smoking as it is detrimental to your lungs.  We will check your urine and refer you to eye doctor.  Please follow up in one month for follow up and repeat A1c.   I have ordered the following labs today:   Lab Orders         Microalbumin / Creatinine Urine Ratio       Referrals ordered today:    Referral Orders         Ambulatory referral to Ophthalmology      I have ordered the following medication/changed the following medications:   Stop the following medications: There are no discontinued medications.   Start the following medications: No orders of the defined types were placed in this encounter.    Follow-up: 1 month follow up  Please make sure to arrive 15 minutes prior to your next appointment. If you arrive late, you may be asked to reschedule.   We look forward to seeing you next time. Please call our clinic at [phone] if you have any questions or concerns. The best time to call is Monday-Friday from 9am-4pm, but there is someone available 24/7. If after hours or the weekend, call the main hospital number and ask for the Internal Medicine Resident On-Call. If you need medication refills, please notify your pharmacy one week in advance and they will send [place] a request.  Thank you for letting us take part in your care. Wishing you the best!  Thank you, Idamae Schuller, MD

## 2022-11-29 LAB — MICROALBUMIN / CREATININE URINE RATIO
Creatinine, Urine: 86.3 mg/dL
Microalb/Creat Ratio: 5 mg/g creat (ref 0–29)
Microalbumin, Urine: 4.4 ug/mL

## 2022-11-30 ENCOUNTER — Other Ambulatory Visit: Payer: Self-pay | Admitting: Interventional Radiology

## 2022-11-30 DIAGNOSIS — S81802A Unspecified open wound, left lower leg, initial encounter: Secondary | ICD-10-CM

## 2022-11-30 NOTE — Assessment & Plan Note (Signed)
Pt has DMII. Last A1c was 10.4 two months ago. He was discharged on home synjardy, trulicity 3.0 weekly, pioglitazone 30 mg qd, and pregabalin 100 TID. Can increase up to 4.5 mg weekly if needed. Will continue current regimen give pt reporting good CBG readings. He states his CBG in the am: 147; highest 165, lowest 110.

## 2022-11-30 NOTE — Assessment & Plan Note (Addendum)
Pt recently hospitalized for acute exacerbation of COPD and asthma. Had multiple hospitalization for this last year. He was discharged on albuterol, albuterol nebulizer, Trelegy inhaler, singulair, and 2 days of prednisone. Pt completed prednisone. He is currently using Breztri inhaler, Dulera, and albuterol. His tachycardia appears 2/2 to using multiple inhalers. Reviewed pulmonology note states to use Breztri and albuterol. Advised pt to only use Breztri and albuterol. Pt has pulmonology follow up 12/05/22. He states he is using albuterol minimally. Lungs with good air movement with minimal wheeze in right upper lobe. Pt currently smoking so advised to abstain completely given his lung pathology.  -

## 2022-11-30 NOTE — Assessment & Plan Note (Signed)
ACR checked and normal. Referral to eye doctor made.
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
## 2022-12-05 ENCOUNTER — Ambulatory Visit (INDEPENDENT_AMBULATORY_CARE_PROVIDER_SITE_OTHER): Payer: 59 | Admitting: Student

## 2022-12-05 ENCOUNTER — Other Ambulatory Visit: Payer: 59

## 2022-12-05 ENCOUNTER — Encounter: Payer: Self-pay | Admitting: Student

## 2022-12-05 VITALS — BP 120/60 | HR 105 | Temp 98.2°F | Ht 75.0 in | Wt 224.0 lb

## 2022-12-05 DIAGNOSIS — J4550 Severe persistent asthma, uncomplicated: Secondary | ICD-10-CM | POA: Diagnosis not present

## 2022-12-05 DIAGNOSIS — J8283 Eosinophilic asthma: Secondary | ICD-10-CM | POA: Diagnosis not present

## 2022-12-05 MED ORDER — BREZTRI AEROSPHERE 160-9-4.8 MCG/ACT IN AERO: 2.0000 | INHALATION_SPRAY | Freq: Two times a day (BID) | RESPIRATORY_TRACT | 11 refills | Status: AC

## 2022-12-05 NOTE — Patient Instructions (Addendum)
-   try breztri 2 puffs twice daily rinse mouth out after use, take coupon with you - albuterol 1-2 puffs as needed - ok to continue singulair 10 mg daily - we will start nucala as well to treat eosinophilic asthma - see you in 3 months or sooner if need be with breathing tests (PFTs)

## 2022-12-06 MED ORDER — IOHEXOL 300 MG/ML  SOLN
100.0000 mL | Freq: Once | INTRAMUSCULAR | Status: AC | PRN
  Administered 2022-12-06: 100 mL via INTRAVENOUS

## 2022-12-07 ENCOUNTER — Ambulatory Visit (INDEPENDENT_AMBULATORY_CARE_PROVIDER_SITE_OTHER): Payer: 59 | Admitting: Student

## 2022-12-07 ENCOUNTER — Encounter (HOSPITAL_COMMUNITY): Payer: Self-pay | Admitting: Clinical

## 2022-12-07 ENCOUNTER — Ambulatory Visit (INDEPENDENT_AMBULATORY_CARE_PROVIDER_SITE_OTHER): Payer: 59 | Admitting: Clinical

## 2022-12-07 ENCOUNTER — Ambulatory Visit (INDEPENDENT_AMBULATORY_CARE_PROVIDER_SITE_OTHER): Payer: 59

## 2022-12-07 DIAGNOSIS — E1140 Type 2 diabetes mellitus with diabetic neuropathy, unspecified: Secondary | ICD-10-CM | POA: Diagnosis not present

## 2022-12-07 DIAGNOSIS — F331 Major depressive disorder, recurrent, moderate: Secondary | ICD-10-CM

## 2022-12-07 DIAGNOSIS — M2041 Other hammer toe(s) (acquired), right foot: Secondary | ICD-10-CM | POA: Diagnosis not present

## 2022-12-07 DIAGNOSIS — M2042 Other hammer toe(s) (acquired), left foot: Secondary | ICD-10-CM

## 2022-12-07 DIAGNOSIS — J8283 Eosinophilic asthma: Secondary | ICD-10-CM | POA: Diagnosis not present

## 2022-12-07 DIAGNOSIS — F411 Generalized anxiety disorder: Secondary | ICD-10-CM | POA: Diagnosis not present

## 2022-12-07 LAB — PULMONARY FUNCTION TEST
DL/VA % pred: 102 %
DL/VA: 4.21 ml/min/mmHg/L
DLCO cor % pred: 71 %
DLCO cor: 21.17 ml/min/mmHg
DLCO unc % pred: 67 %
DLCO unc: 20.08 ml/min/mmHg
FEF 25-75 Post: 2.66 L/sec
FEF 25-75 Pre: 1.32 L/sec
FEF2575-%Change-Post: 102 %
FEF2575-%Pred-Post: 85 %
FEF2575-%Pred-Pre: 42 %
FEV1-%Change-Post: 17 %
FEV1-%Pred-Post: 63 %
FEV1-%Pred-Pre: 54 %
FEV1-Post: 2.48 L
FEV1-Pre: 2.11 L
FEV1FVC-%Change-Post: 0 %
FEV1FVC-%Pred-Pre: 96 %
FEV6-%Change-Post: 17 %
FEV6-%Pred-Post: 67 %
FEV6-%Pred-Pre: 57 %
FEV6-Post: 3.32 L
FEV6-Pre: 2.82 L
FEV6FVC-%Change-Post: 0 %
FEV6FVC-%Pred-Post: 103 %
FEV6FVC-%Pred-Pre: 102 %
FVC-%Change-Post: 16 %
FVC-%Pred-Post: 64 %
FVC-%Pred-Pre: 55 %
FVC-Post: 3.37 L
FVC-Pre: 2.89 L
Post FEV1/FVC ratio: 73 %
Post FEV6/FVC ratio: 98 %
Pre FEV1/FVC ratio: 73 %
Pre FEV6/FVC Ratio: 98 %
RV % pred: 113 %
RV: 2.81 L
TLC % pred: 83 %
TLC: 6.35 L

## 2022-12-07 NOTE — Progress Notes (Signed)
THERAPIST PROGRESS NOTE  Session Time: 10:00am-10:56am  Session # 4  Participation Level: Active  Behavioral Response: Casual and Neat Alert Depressed and Tearful  Type of Therapy: Individual Therapy  Treatment Goals addressed:  LTG: score less than 5 on the Generalized Anxiety Disorder 7 Scale (GAD-7)  STG: practice problem solving skills 3 times per week for the next 4 weeks  LTG: Reduce frequency, intensity, and duration of depression symptoms so that daily functioning is improved  STG: identify cognitive patterns and beliefs that support depression  LTG: continue to maintain his over 30-year sobriety from cocaine and alcohol   ProgressTowards Goals: Progressing  Interventions: CBT, Psychosocial Skills: "I" statements, positive affirmations, and Supportive  Summary: Richard [name] is a 62 y.o. male who presents with depression and anxiety as well as in remission for over 30 years from a crack cocaine addiction.  His mood has been sad lately and he continues to have great difficulty with sleep.  He has been hospitalized for asthma "and anxiety" and has been out of the hospital about a week.  His medicines were changed. He has not had panic attacks lately.  He stated that he is going to school, but his grades are falling.  The woman he had previously been talking to is now officially his girlfriend but he questions whether he is ready for a relationship.  He feels he is "really messed up" and "a crazy person."  He talked about being unhappy and feeling like he does not want to do anything at all.  "I'm just there, I don't feel anything, I'm just taking up space."  We talked about his upcoming doctor visit on 3/27 and since it cannot be moved up, CSW told him about the possibility of going to [facility] if needed before then.  He continues to emphasize how he cannot open up with strangers.  He asked if is it wrong for him to be "dependent" on therapist, was told he is not actually dependent but  is trusting therapist to help him which is different.  He stated he always feels better after a session.  He apologized several times for crying in session, was told this is normal when a person releases pent-up emotions.    He stated he is absolutely not suicidal, but his mood is "all over the place."  He is stressed out by his health issues and by not feeing ready to move into a physical relationship with girlfriend.  It stresses him when his daughter asks what he talked about in therapy.  He feels nobody knows him because he keeps his feelings hidden and this stresses him.  CBT was used to look at whether various thoughts were helpful/reality-based or not.  He is planning to go to a comedy show tonight and is planning to start going to the gym again.  The effect of triggering happiness was explained as a means of building up his resilience.    He was asked to come up with positive affirmations he can say to himself when he finds himself putting himself down, which was initially difficult.  After the first 1-2, he just started naming one after another.  CSW wrote these down on a piece of paper for him to take home with him.  He talked about how he can use them, was planning to put them on the mirror.  CSW asked him to read them aloud daily.  He was given positive strokes for actually coming up with the list.  We also talked a lot about using "I" statements to communicate with his daughter and his girlfriend and he agreed to try that as well.  Suicidal/Homicidal: No   Therapist Response: Therapist was dismayed to see how depressed and anxious the patient was, but was happy to see this change during the course of the session.  He is actually making progress and is very thoughtful about all that is said during session.  Checking to see if he did the assigned work will be important to continue his progress.  Recommendations:  Return to therapy in 2 weeks, engage in self care behaviors, continue to review  cognitive distortions list, use written affirmations daily aloud, practice using "I" statements  Plan: Return again in 2 weeks.  Diagnosis:  Name Primary?   Moderate episode of recurrent major depressive disorder (HCC) (33.1) Yes   Generalized anxiety disorder (F41.1)      Collaboration of Care: Psychiatrist AEB - referral has been made to psychiatry and CSW will accompany patient to that appointment  Patient/Guardian was advised Release of Information must be obtained prior to any record release in order to collaborate their care with an outside provider. Patient/Guardian was advised if they have not already done so to contact the registration department to sign all necessary forms in order for [place] to release information regarding their care.   Consent: Patient/Guardian gives verbal consent for treatment and assignment of benefits for services provided during this visit. Patient/Guardian expressed understanding and agreed to proceed.     Maretta Los, LCSW 12/07/2022

## 2022-12-07 NOTE — Patient Instructions (Signed)
Full PFT Performed Today. 

## 2022-12-07 NOTE — Progress Notes (Signed)
Patient presents today to pick up diabetic shoes and insoles.  Patient was dispensed 1 pair of diabetic shoes and 3 pairs of foam casted diabetic insoles.   He tried on the shoes with the insoles and the fit was satisfactory.   Will follow up next year for new order.

## 2022-12-07 NOTE — Progress Notes (Signed)
Full PFT Performed Today. 

## 2022-12-07 NOTE — Progress Notes (Signed)
Internal Medicine Clinic Attending  Case discussed with Dr. Khan  at the time of the visit.  We reviewed the resident's history and exam and pertinent patient test results.  I agree with the assessment, diagnosis, and plan of care documented in the resident's note.  

## 2022-12-10 ENCOUNTER — Telehealth: Payer: Self-pay

## 2022-12-10 ENCOUNTER — Other Ambulatory Visit (HOSPITAL_COMMUNITY): Payer: Self-pay

## 2022-12-10 DIAGNOSIS — J8283 Eosinophilic asthma: Secondary | ICD-10-CM

## 2022-12-10 DIAGNOSIS — J4550 Severe persistent asthma, uncomplicated: Secondary | ICD-10-CM

## 2022-12-10 NOTE — Telephone Encounter (Signed)
Received New start paperwork for [place]. Will update as we work through the benefits process.  Submitted a Prior Authorization request to [facility] for [place] via CoverMyMeds. Will update once we receive a response.  Key: [id number]

## 2022-12-21 ENCOUNTER — Ambulatory Visit (INDEPENDENT_AMBULATORY_CARE_PROVIDER_SITE_OTHER): Payer: 59 | Admitting: Clinical

## 2022-12-21 ENCOUNTER — Encounter (HOSPITAL_COMMUNITY): Payer: Self-pay | Admitting: Clinical

## 2022-12-21 DIAGNOSIS — F332 Major depressive disorder, recurrent severe without psychotic features: Secondary | ICD-10-CM

## 2022-12-21 DIAGNOSIS — F411 Generalized anxiety disorder: Secondary | ICD-10-CM

## 2022-12-21 NOTE — Progress Notes (Signed)
THERAPIST PROGRESS NOTE  Session Time: 8:04-9:05am  Session # 5  Participation Level: Active  Behavioral Response: Casual and Neat Alert Depressed and Tearful  Type of Therapy: Individual Therapy  Treatment Goals addressed:  LTG: score less than 5 on the Generalized Anxiety Disorder 7 Scale (GAD-7)  STG: practice problem solving skills 3 times per week for the next 4 weeks  LTG: Reduce frequency, intensity, and duration of depression symptoms so that daily functioning is improved  STG: identify cognitive patterns and beliefs that support depression  LTG: continue to maintain his over 30-year sobriety from cocaine and alcohol   ProgressTowards Goals: Progressing  Interventions: CBT and Supportive  Summary: Richard [name] is a 62 y.o. male who presents with depression and anxiety as well as in remission for over 30 years from a crack cocaine addiction.   He now understands when he has pain in his chest that it is anxiety so he does not just head for the ED immediately.  He is worried about seeing the doctor and that process was discussed in detail, including the fact that he will be in the driver's seat ultimately about whether to take a medicine.  He talked about his new romantic relationship and said that both of them have emotionally been "all over the place."  He has a very difficult time accepting it when she gives him compliments such as saying he is kind or nice.  He feels it is all like a movie in which he plays a part every moment of the day that he is not in his own home alone.  This is exhausting for him.  He is having problems with erectile dysfunction and has for years, believes it is because of his diabetes and that it makes him less of a man.  A low dose of medicine has not helped in the past, but he has been researching other options and does have an upcoming doctor's appointment to discuss.  He was given positive feedback for doing the research but again he was not able  to accept that he was doing something right.    He stated that he has lost his appetite and sometimes does not eat all day despite the need to do so due to his diabetes.  He is going to school but is unfocused and unmotivated.  His grades continue to drop, but he continues to put up a front as though everything is fine and he is a happy person.  As opposed to the movie he feels he is in in which he is obligated to act happy all the time, he stated, "in reality I feel horrible."  He also admitted once again that he thinks about using crack cocaine to ease his feelings, but he knows that using only once would send him down a road he would never recover from.  Nonetheless this is something he thinks about daily and struggles with constantly.  He thinks that if he were to tell people how he feels and that his brain is crazy they would say to themselves that he is looking for pity and attention.  But in reality he is avoiding attention altogether, cannot stand the thought of it.  He stated he would never consider suicide and denied all SI, plan, means, and intent.  CSW reviewed with him that 4 things go into his illness -- genetics, brain chemistry, life events, and his personality.  He stated that and the fact that some well-known people he respects take  medicine gives him some hope that he can be helped.  Nonetheless, he tearfully shared that he feels he must reign in his true emotions in order to keep from people thinking he is not okay.  At the end of the session, he visibly gathered himself, wiped his tears, and sat up straight to prepare himself to go back into the world "to put on the act again."     Suicidal/Homicidal: No   Therapist Response: Patient is making progress in considering medication as a possible aid to his profound depression and severe anxiety.  In every way and with every symptom his depression and anxiety appear to be worsening.    Recommendations:  Return to therapy in 2 weeks,  engage in self care behaviors, review the 2nd copy of cognitive distortions given to him  Plan: Return again in 2 weeks.  Diagnosis:  Encounter Diagnoses  Name Primary?   Severe episode of recurrent major depressive disorder, without psychotic features ([place]) Yes   Generalized anxiety disorder      Collaboration of Care: Psychiatrist AEB - referral has been made to psychiatry and CSW will accompany patient to that appointment  Patient/Guardian was advised Release of Information must be obtained prior to any record release in order to collaborate their care with an outside provider. Patient/Guardian was advised if they have not already done so to contact the registration department to sign all necessary forms in order for [place] to release information regarding their care.   Consent: Patient/Guardian gives verbal consent for treatment and assignment of benefits for services provided during this visit. Patient/Guardian expressed understanding and agreed to proceed.     Maretta Los, LCSW 12/21/2022

## 2022-12-27 ENCOUNTER — Ambulatory Visit
Admission: RE | Admit: 2022-12-27 | Discharge: 2022-12-27 | Disposition: A | Discharge status: Home / Self Care | Payer: 59 | Source: Ambulatory Visit | Attending: Interventional Radiology | Admitting: Interventional Radiology

## 2022-12-27 DIAGNOSIS — S81802A Unspecified open wound, left lower leg, initial encounter: Secondary | ICD-10-CM

## 2022-12-27 HISTORY — PX: IR RADIOLOGIST EVAL & MGMT: IMG5224
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
## 2022-12-28 ENCOUNTER — Other Ambulatory Visit (HOSPITAL_COMMUNITY): Payer: Self-pay

## 2022-12-28 ENCOUNTER — Ambulatory Visit (INDEPENDENT_AMBULATORY_CARE_PROVIDER_SITE_OTHER): Payer: 59 | Admitting: Podiatry

## 2022-12-28 ENCOUNTER — Encounter: Payer: Self-pay | Admitting: Podiatry

## 2022-12-28 DIAGNOSIS — E1140 Type 2 diabetes mellitus with diabetic neuropathy, unspecified: Secondary | ICD-10-CM | POA: Diagnosis not present

## 2022-12-28 DIAGNOSIS — M79674 Pain in right toe(s): Secondary | ICD-10-CM

## 2022-12-28 DIAGNOSIS — L84 Corns and callosities: Secondary | ICD-10-CM

## 2022-12-28 DIAGNOSIS — M79675 Pain in left toe(s): Secondary | ICD-10-CM | POA: Diagnosis not present

## 2022-12-28 DIAGNOSIS — B351 Tinea unguium: Secondary | ICD-10-CM | POA: Diagnosis not present

## 2022-12-28 MED ORDER — NUCALA 100 MG/ML ~~LOC~~ SOAJ
100.0000 mg | SUBCUTANEOUS | 0 refills | Status: DC
  Filled 2022-12-28 (×2): qty 1, 28d supply, fill #0

## 2022-12-28 NOTE — Progress Notes (Addendum)
This patient returns to my office for at risk foot care.  This patient requires this care by a professional since this patient will be at risk due to having diabetic neuropathy and amputation fifth ray left foot and PAD.  This patient is unable to cut nails himself since the patient cannot reach his nails.These nails are painful walking and wearing shoes.  He also has painful pre-ulcerous callus right forefoot and left foot also.This patient presents for at risk foot care today.  General Appearance  Alert, conversant and in no acute stress.  Vascular  Dorsalis pedis and posterior tibial  pulses are palpable  bilaterally.  Capillary return is within normal limits  bilaterally. Temperature is within normal limits  bilaterally.  Neurologic  Senn-Weinstein monofilament wire test diminished/absent  bilaterally. Muscle power within normal limits bilaterally.  Nails Thick disfigured discolored nails with subungual debris  from hallux to fifth toes right foot and 1-3 left foot.. No evidence of bacterial infection or drainage bilaterally.  Orthopedic  No limitations of motion  feet .  No crepitus or effusions noted.  No bony pathology or digital deformities noted.  Skin  normotropic skin with no porokeratosis noted bilaterally.  No signs of infections or ulcers noted.   Thick pre-ulcerous callus right forefoot. Callus at amputation site left foot.  Onychomycosis  Pain in right toes  Pain in left toes  Callus  forefoot  B/L.  Consent was obtained for treatment procedures.   Mechanical debridement of nails 1-5  right foot and 1-3 left foot. performed with a nail nipper.  Filed with dremel without incident. Debride callus with dremel tool.   Return office visit    10 weeks                  Told patient to return for periodic foot care and evaluation due to potential at risk complications.   Gardiner Barefoot DPM

## 2022-12-28 NOTE — Telephone Encounter (Signed)
Delivery instructions have been updated in [place], medication will be couriered to [facility] on 12/31/22.  Rx has been processed in [facility] and the patient has no copay at this time.

## 2022-12-28 NOTE — Telephone Encounter (Signed)
Received notification from [facility] regarding a prior authorization for [place]. Authorization has been APPROVED from 12/10/22 to 06/12/23. Approval letter sent to scan center.  Per test claim, copay for 28 days supply is $0  Patient can fill through [place]: [phone]   Authorization # [id number] Phone # [phone]  Rx sent to [facility] to be couriered to clinic by 01/03/23. ATC pt to schedule Nucala new tsart visit. Left VM requesting return call  Knox Saliva, PharmD, MPH, BCPS, CPP Clinical Pharmacist (Rheumatology and Pulmonology)

## 2023-01-02 ENCOUNTER — Ambulatory Visit (HOSPITAL_BASED_OUTPATIENT_CLINIC_OR_DEPARTMENT_OTHER): Payer: 59 | Admitting: Psychiatry

## 2023-01-02 ENCOUNTER — Encounter (HOSPITAL_COMMUNITY): Payer: Self-pay | Admitting: Psychiatry

## 2023-01-02 VITALS — BP 119/70 | HR 102 | Ht 75.0 in | Wt 220.0 lb

## 2023-01-02 DIAGNOSIS — F411 Generalized anxiety disorder: Secondary | ICD-10-CM

## 2023-01-02 DIAGNOSIS — F332 Major depressive disorder, recurrent severe without psychotic features: Secondary | ICD-10-CM | POA: Diagnosis not present

## 2023-01-02 MED ORDER — TRAZODONE HCL 50 MG PO TABS: 25.0000 mg | ORAL_TABLET | Freq: Every evening | ORAL | 2 refills | Status: DC | PRN

## 2023-01-02 MED ORDER — FLUOXETINE HCL 10 MG PO CAPS: 10.0000 mg | ORAL_CAPSULE | Freq: Every day | ORAL | 2 refills | Status: DC

## 2023-01-02 MED ORDER — HYDROXYZINE HCL 10 MG PO TABS: 10.0000 mg | ORAL_TABLET | Freq: Three times a day (TID) | ORAL | 1 refills | Status: DC | PRN

## 2023-01-02 NOTE — Telephone Encounter (Signed)
Patient scheduled for Nucala new start appt on 01/07/2023  Knox Saliva, PharmD, MPH, BCPS, CPP Clinical Pharmacist (Rheumatology and Pulmonology)
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
## 2023-01-04 ENCOUNTER — Ambulatory Visit (HOSPITAL_COMMUNITY): Payer: 59 | Admitting: Clinical

## 2023-01-04 NOTE — Progress Notes (Unsigned)
HPI Patient presents today to [facility] to see pharmacy team for [facility] new start.  Past medical history includes ***  Number of hospitalizations in past year: Number of ***COPD/asthma exacerbations in past year:   Respiratory Medications Current regimen: *** Tried in past: *** Patient reports {Adherence challenges yes no:3044014::"adherence challenges","no known adherence challenges"}  OBJECTIVE Allergies  Allergen Reactions   Shellfish Allergy Anaphylaxis and Shortness Of Breath    Outpatient Encounter Medications as of 01/07/2023  Medication Sig   albuterol (PROVENTIL) (2.5 MG/3ML) 0.083% nebulizer solution Take 2.5 mg by nebulization 2 (two) times daily.   albuterol (VENTOLIN HFA) 108 (90 Base) MCG/ACT inhaler Inhale 1-2 puffs into the lungs every 4 (four) hours as needed for wheezing or shortness of breath.   Ascorbic Acid (VITAMIN C) 1000 MG tablet Take 1,000 mg by mouth daily.   aspirin EC 81 MG EC tablet Take 1 tablet (81 mg total) by mouth daily.   atorvastatin (LIPITOR) 40 MG tablet Take 1 tablet (40 mg total) by mouth daily.   Budeson-Glycopyrrol-Formoterol (BREZTRI AEROSPHERE) 160-9-4.8 MCG/ACT AERO Inhale 2 puffs into the lungs in the morning and at bedtime.   clopidogrel (PLAVIX) 75 MG tablet Take 1 tablet (75 mg total) by mouth daily.   Dulaglutide (TRULICITY) 3 0000000 SOPN Inject 3 mg as directed once a week. (Patient taking differently: Inject 3 mg into the skin every Sunday.)   Empagliflozin-metFORMIN HCl (SYNJARDY) 12.02-999 MG TABS Take 1 tablet by mouth 2 (two) times daily.   FLUoxetine (PROZAC) 10 MG capsule Take 1 capsule (10 mg total) by mouth daily.   fluticasone (FLONASE) 50 MCG/ACT nasal spray Place 1 spray into both nostrils daily. (Patient taking differently: Place 1 spray into both nostrils daily as needed for allergies.)   hydrOXYzine (ATARAX) 10 MG tablet Take 1 tablet (10 mg total) by mouth 3 (three) times daily as needed for anxiety.    ipratropium-albuterol (DUONEB) 0.5-2.5 (3) MG/3ML SOLN Take 3 mLs by nebulization every 6 (six) hours as needed (for wheezing not relieved by albuterol inhaler). (Patient not taking: Reported on 11/22/2022)   Mepolizumab (NUCALA) 100 MG/ML SOAJ Inject 1 mL (100 mg total) into the skin every 28 (twenty-eight) days. Courier to pulm: [address], [address], [place] [address]. Appt 01/03/23   montelukast (SINGULAIR) 10 MG tablet Take 1 tablet (10 mg total) by mouth at bedtime.   Multiple Vitamins-Minerals (MULTIVITAMIN MEN 50+) TABS Take 1 tablet by mouth daily.   nitroGLYCERIN (NITRODUR - DOSED IN MG/24 HR) 0.2 mg/hr patch Place 1 patch (0.2 mg total) onto the skin daily. (Patient not taking: Reported on 11/22/2022)   pioglitazone (ACTOS) 30 MG tablet Take 1 tablet (30 mg total) by mouth daily.   predniSONE (DELTASONE) 20 MG tablet Take 2 tablets (40 mg total) by mouth daily with breakfast.   pregabalin (LYRICA) 100 MG capsule Take 1 capsule (100 mg total) by mouth 3 (three) times daily.   traZODone (DESYREL) 50 MG tablet Take 0.5-1 tablets (25-50 mg total) by mouth at bedtime as needed for sleep.   No facility-administered encounter medications on file as of 01/07/2023.     Immunization History  Administered Date(s) Administered   Influenza,inj,Quad PF,6+ Mos 07/07/2018, 08/06/2019, 08/17/2021   Influenza-Unspecified 05/09/2020, 07/08/2022   PFIZER(Purple Top)SARS-COV-2 Vaccination 07/14/2020, 09/19/2020   PPD Test 07/12/2017   Pneumococcal Polysaccharide-23 10/24/2018   Tdap 09/19/2018     PFTs    Latest Ref Rng & Units 12/07/2022    2:35 PM 12/20/2015  9:03 AM  PFT Results  FVC-Pre L 2.89  2.70   FVC-Predicted Pre % 55  59   FVC-Post L 3.37  3.41   FVC-Predicted Post % 64  75   Pre FEV1/FVC % % 73  75   Post FEV1/FCV % % 73  70   FEV1-Pre L 2.11  2.02   FEV1-Predicted Pre % 54  56   FEV1-Post L 2.48  2.39   DLCO uncorrected ml/min/mmHg 20.08  18.71   DLCO UNC% % 67  51   DLCO  corrected ml/min/mmHg 21.17    DLCO COR %Predicted % 71    DLVA Predicted % 102  83   TLC L 6.35  4.94   TLC % Predicted % 83  65   RV % Predicted % 113  87      Eosinophils Most recent blood eosinophil count was *** cells/microL taken on ***.   IgE: *** on ***   Assessment   Biologics training for mepolizumab (Nucala)  Goals of therapy: Mechanism of Action: Not fully understood. It does act an interleukin-5 (IL-5) antagonist monoclonal antibody that reduces the production and survival of eosinophils by blocking the binding of IL-5 to the alpha chain of the receptor complex on the eosinophil cell surface. Reviewed that Nucala is add-on medication and patient must continue maintenance inhaler regimen. Response to therapy: may take 3 months to 6 months to determine efficacy. Discussed that patients generally feel improvement sooner than 3 months.  Side effects: headache (19%), injection site reaction (7-15%), antibody development (6%), backache (5%), fatigue (5%)  Dose: 100 mg subcutaneously every 4 weeks  Administration/Storage:  Reviewed administration sites of thigh or abdomen (at least 2-3 inches away from abdomen). Reviewed the upper arm is only appropriate if caregiver is administering injection  Do not shake the reconstituted solution as this could lead to product foaming or precipitation. Solution should be clear to opalescent and colorless to pale yellow or pale brown, essentially particle free. Small air bubbles, however, are expected and acceptable. If particulate matter remains in the solution or if the solution appears cloudy or milky, discard the solution.  Reviewed storage of medication in refrigerator. Reviewed that Nucala can be stored at room temperature in unopened carton for up to 7 days.  Access: Approval of Nucala through: {specialtycoverage:25706} Patient enrolled into copay card program to help with copay assistance.  Patient self-administered Nucala  100mg /mL in {injsitedsg:28167} using sample Nucala 100mg /mL autoinjector pen NDC: *** Lot: *** Expiration: ***  Patient monitored for 30 minutes for adverse reaction.  Patient tolerated ***. Injection site checked and no ***.  Medication Reconciliation  A drug regimen assessment was performed, including review of allergies, interactions, disease-state management, dosing and immunization history. Medications were reviewed with the patient, including name, instructions, indication, goals of therapy, potential side effects, importance of adherence, and safe use.  Drug interaction(s): ***  Immunizations  Patient is indicated for the influenzae, pneumonia, and shingles vaccinations. Patient has received *** COVID19 vaccines.   PLAN Continue Nucala 100mg  every 4 weeks. Next dose is due *** and every 4 weeks thereafter. Rx sent to: {SpecialtyPharmacies:25705}. Patient provided with pharmacy phone number and advised to call later this week to schedule shipment to home. Patient provided with copay card information to provide to pharmacy if quoted copay exceeds $5 per month. Continue maintenance asthma regimen of: ***  All questions encouraged and answered.  Instructed patient to reach out with any further questions or concerns.  Thank you for allowing  pharmacy to participate in this patient's care.  This appointment required *** minutes of patient care (this includes precharting, chart review, review of results, face-to-face care, etc.).  Knox Saliva, PharmD, MPH, BCPS, CPP Clinical Pharmacist (Rheumatology and Pulmonology)

## 2023-01-07 ENCOUNTER — Other Ambulatory Visit: Payer: 59 | Admitting: Pharmacist

## 2023-01-08 NOTE — Telephone Encounter (Signed)
Patient r/s for Nucala new start appt on 01/10/2023

## 2023-01-10 ENCOUNTER — Other Ambulatory Visit: Payer: 59 | Admitting: Pharmacist

## 2023-01-10 NOTE — Progress Notes (Deleted)
HPI Patient presents today to [facility] to see pharmacy team for [facility] new start.  Past medical history includes ***  Number of hospitalizations in past year: Number of ***COPD/asthma exacerbations in past year:   Respiratory Medications Current regimen: *** Tried in past: *** Patient reports {Adherence challenges yes no:3044014::"adherence challenges","no known adherence challenges"}  OBJECTIVE Allergies  Allergen Reactions   Shellfish Allergy Anaphylaxis and Shortness Of Breath    Outpatient Encounter Medications as of 01/10/2023  Medication Sig   albuterol (PROVENTIL) (2.5 MG/3ML) 0.083% nebulizer solution Take 2.5 mg by nebulization 2 (two) times daily.   albuterol (VENTOLIN HFA) 108 (90 Base) MCG/ACT inhaler Inhale 1-2 puffs into the lungs every 4 (four) hours as needed for wheezing or shortness of breath.   Ascorbic Acid (VITAMIN C) 1000 MG tablet Take 1,000 mg by mouth daily.   aspirin EC 81 MG EC tablet Take 1 tablet (81 mg total) by mouth daily.   atorvastatin (LIPITOR) 40 MG tablet Take 1 tablet (40 mg total) by mouth daily.   Budeson-Glycopyrrol-Formoterol (BREZTRI AEROSPHERE) 160-9-4.8 MCG/ACT AERO Inhale 2 puffs into the lungs in the morning and at bedtime.   clopidogrel (PLAVIX) 75 MG tablet Take 1 tablet (75 mg total) by mouth daily.   Dulaglutide (TRULICITY) 3 0000000 SOPN Inject 3 mg as directed once a week. (Patient taking differently: Inject 3 mg into the skin every Sunday.)   Empagliflozin-metFORMIN HCl (SYNJARDY) 12.02-999 MG TABS Take 1 tablet by mouth 2 (two) times daily.   FLUoxetine (PROZAC) 10 MG capsule Take 1 capsule (10 mg total) by mouth daily.   fluticasone (FLONASE) 50 MCG/ACT nasal spray Place 1 spray into both nostrils daily. (Patient taking differently: Place 1 spray into both nostrils daily as needed for allergies.)   hydrOXYzine (ATARAX) 10 MG tablet Take 1 tablet (10 mg total) by mouth 3 (three) times daily as needed for anxiety.    ipratropium-albuterol (DUONEB) 0.5-2.5 (3) MG/3ML SOLN Take 3 mLs by nebulization every 6 (six) hours as needed (for wheezing not relieved by albuterol inhaler). (Patient not taking: Reported on 11/22/2022)   Mepolizumab (NUCALA) 100 MG/ML SOAJ Inject 1 mL (100 mg total) into the skin every 28 (twenty-eight) days. Courier to pulm: [address], [place] [address]. Appt 01/03/23   montelukast (SINGULAIR) 10 MG tablet Take 1 tablet (10 mg total) by mouth at bedtime.   Multiple Vitamins-Minerals (MULTIVITAMIN MEN 50+) TABS Take 1 tablet by mouth daily.   nitroGLYCERIN (NITRODUR - DOSED IN MG/24 HR) 0.2 mg/hr patch Place 1 patch (0.2 mg total) onto the skin daily. (Patient not taking: Reported on 11/22/2022)   pioglitazone (ACTOS) 30 MG tablet Take 1 tablet (30 mg total) by mouth daily.   predniSONE (DELTASONE) 20 MG tablet Take 2 tablets (40 mg total) by mouth daily with breakfast.   pregabalin (LYRICA) 100 MG capsule Take 1 capsule (100 mg total) by mouth 3 (three) times daily.   traZODone (DESYREL) 50 MG tablet Take 0.5-1 tablets (25-50 mg total) by mouth at bedtime as needed for sleep.   No facility-administered encounter medications on file as of 01/10/2023.     Immunization History  Administered Date(s) Administered   Influenza,inj,Quad PF,6+ Mos 07/07/2018, 08/06/2019, 08/17/2021   Influenza-Unspecified 05/09/2020, 07/08/2022   PFIZER(Purple Top)SARS-COV-2 Vaccination 07/14/2020, 09/19/2020   PPD Test 07/12/2017   Pneumococcal Polysaccharide-23 10/24/2018   Tdap 09/19/2018     PFTs    Latest Ref Rng & Units 12/07/2022    2:35 PM 12/20/2015  9:03 AM  PFT Results  FVC-Pre L 2.89  2.70   FVC-Predicted Pre % 55  59   FVC-Post L 3.37  3.41   FVC-Predicted Post % 64  75   Pre FEV1/FVC % % 73  75   Post FEV1/FCV % % 73  70   FEV1-Pre L 2.11  2.02   FEV1-Predicted Pre % 54  56   FEV1-Post L 2.48  2.39   DLCO uncorrected ml/min/mmHg 20.08  18.71   DLCO UNC% % 67  51   DLCO  corrected ml/min/mmHg 21.17    DLCO COR %Predicted % 71    DLVA Predicted % 102  83   TLC L 6.35  4.94   TLC % Predicted % 83  65   RV % Predicted % 113  87      Eosinophils Most recent blood eosinophil count was *** cells/microL taken on ***.   IgE: *** on ***   Assessment   Biologics training for mepolizumab (Nucala)  Goals of therapy: Mechanism of Action: Not fully understood. It does act an interleukin-5 (IL-5) antagonist monoclonal antibody that reduces the production and survival of eosinophils by blocking the binding of IL-5 to the alpha chain of the receptor complex on the eosinophil cell surface. Reviewed that Nucala is add-on medication and patient must continue maintenance inhaler regimen. Response to therapy: may take 3 months to 6 months to determine efficacy. Discussed that patients generally feel improvement sooner than 3 months.  Side effects: headache (19%), injection site reaction (7-15%), antibody development (6%), backache (5%), fatigue (5%)  Dose: 100 mg subcutaneously every 4 weeks  Administration/Storage:  Reviewed administration sites of thigh or abdomen (at least 2-3 inches away from abdomen). Reviewed the upper arm is only appropriate if caregiver is administering injection  Do not shake the reconstituted solution as this could lead to product foaming or precipitation. Solution should be clear to opalescent and colorless to pale yellow or pale brown, essentially particle free. Small air bubbles, however, are expected and acceptable. If particulate matter remains in the solution or if the solution appears cloudy or milky, discard the solution.  Reviewed storage of medication in refrigerator. Reviewed that Nucala can be stored at room temperature in unopened carton for up to 7 days.  Access: Approval of Nucala through: {specialtycoverage:25706} Patient enrolled into copay card program to help with copay assistance.  Patient self-administered Nucala  100mg /mL in {injsitedsg:28167} using sample Nucala 100mg /mL autoinjector pen NDC: *** Lot: *** Expiration: ***  Patient monitored for 30 minutes for adverse reaction.  Patient tolerated ***. Injection site checked and no ***.  Medication Reconciliation  A drug regimen assessment was performed, including review of allergies, interactions, disease-state management, dosing and immunization history. Medications were reviewed with the patient, including name, instructions, indication, goals of therapy, potential side effects, importance of adherence, and safe use.  Drug interaction(s): ***  Immunizations  Patient is indicated for the influenzae, pneumonia, and shingles vaccinations. Patient has received *** COVID19 vaccines.   PLAN Continue Nucala 100mg  every 4 weeks. Next dose is due *** and every 4 weeks thereafter. Rx sent to: {SpecialtyPharmacies:25705}. Patient provided with pharmacy phone number and advised to call later this week to schedule shipment to home. Patient provided with copay card information to provide to pharmacy if quoted copay exceeds $5 per month. Continue maintenance asthma regimen of: ***  All questions encouraged and answered.  Instructed patient to reach out with any further questions or concerns.  Thank you for allowing  pharmacy to participate in this patient's care.  This appointment required *** minutes of patient care (this includes precharting, chart review, review of results, face-to-face care, etc.).

## 2023-01-10 NOTE — Telephone Encounter (Signed)
Patient no-show to [facility] new start appt today. Second no-show  Knox Saliva, PharmD, MPH, BCPS, CPP Clinical Pharmacist (Rheumatology and Pulmonology)

## 2023-01-14 NOTE — Telephone Encounter (Addendum)
Called pt. Patient r/s Nucala new start for 01/24/23. If no-show to this appt, will no be r/s. Will send letter  Chesley Mires, PharmD, MPH, BCPS, CPP Clinical Pharmacist (Rheumatology and Pulmonology)

## 2023-01-22 ENCOUNTER — Other Ambulatory Visit (HOSPITAL_COMMUNITY): Payer: Self-pay

## 2023-01-24 ENCOUNTER — Other Ambulatory Visit (HOSPITAL_COMMUNITY): Payer: Self-pay

## 2023-01-24 ENCOUNTER — Encounter: Payer: Self-pay | Admitting: Internal Medicine

## 2023-01-24 ENCOUNTER — Ambulatory Visit: Payer: 59 | Admitting: Pharmacist

## 2023-01-24 ENCOUNTER — Ambulatory Visit (INDEPENDENT_AMBULATORY_CARE_PROVIDER_SITE_OTHER): Payer: 59 | Admitting: Internal Medicine

## 2023-01-24 ENCOUNTER — Ambulatory Visit: Payer: 59 | Admitting: Podiatry

## 2023-01-24 ENCOUNTER — Other Ambulatory Visit: Payer: Self-pay

## 2023-01-24 VITALS — BP 119/71 | HR 96 | Temp 98.4°F | Resp 24 | Ht 75.0 in | Wt 221.5 lb

## 2023-01-24 DIAGNOSIS — J4489 Other specified chronic obstructive pulmonary disease: Secondary | ICD-10-CM

## 2023-01-24 DIAGNOSIS — N521 Erectile dysfunction due to diseases classified elsewhere: Secondary | ICD-10-CM | POA: Diagnosis not present

## 2023-01-24 DIAGNOSIS — J4550 Severe persistent asthma, uncomplicated: Secondary | ICD-10-CM

## 2023-01-24 DIAGNOSIS — E1169 Type 2 diabetes mellitus with other specified complication: Secondary | ICD-10-CM

## 2023-01-24 DIAGNOSIS — Z7984 Long term (current) use of oral hypoglycemic drugs: Secondary | ICD-10-CM

## 2023-01-24 DIAGNOSIS — Z7189 Other specified counseling: Secondary | ICD-10-CM

## 2023-01-24 DIAGNOSIS — Z794 Long term (current) use of insulin: Secondary | ICD-10-CM

## 2023-01-24 DIAGNOSIS — E1165 Type 2 diabetes mellitus with hyperglycemia: Secondary | ICD-10-CM

## 2023-01-24 DIAGNOSIS — E1151 Type 2 diabetes mellitus with diabetic peripheral angiopathy without gangrene: Secondary | ICD-10-CM | POA: Diagnosis not present

## 2023-01-24 DIAGNOSIS — Z89422 Acquired absence of other left toe(s): Secondary | ICD-10-CM

## 2023-01-24 DIAGNOSIS — J8283 Eosinophilic asthma: Secondary | ICD-10-CM

## 2023-01-24 DIAGNOSIS — E1140 Type 2 diabetes mellitus with diabetic neuropathy, unspecified: Secondary | ICD-10-CM

## 2023-01-24 LAB — GLUCOSE, CAPILLARY: Glucose-Capillary: 131 mg/dL — ABNORMAL HIGH (ref 70–99)

## 2023-01-24 LAB — POCT GLYCOSYLATED HEMOGLOBIN (HGB A1C): Hemoglobin A1C: 7.3 % — AB (ref 4.0–5.6)

## 2023-01-24 MED ORDER — SILDENAFIL CITRATE 100 MG PO TABS
100.0000 mg | ORAL_TABLET | ORAL | 3 refills | Status: DC | PRN
  Filled 2023-01-24: qty 10, 10d supply, fill #0
  Filled 2023-03-22: qty 10, 10d supply, fill #1

## 2023-01-24 MED ORDER — ALBUTEROL SULFATE HFA 108 (90 BASE) MCG/ACT IN AERS
1.0000 | INHALATION_SPRAY | RESPIRATORY_TRACT | 2 refills | Status: DC | PRN
Start: 2023-01-24 — End: 2023-04-05
  Filled 2023-01-24: qty 6.7, 25d supply, fill #0
  Filled 2023-03-22: qty 6.7, 25d supply, fill #1

## 2023-01-24 MED ORDER — NUCALA 100 MG/ML ~~LOC~~ SOAJ
100.0000 mg | SUBCUTANEOUS | 5 refills | Status: DC
Start: 2023-01-24 — End: 2023-08-15
  Filled 2023-01-24 – 2023-02-12 (×2): qty 1, 28d supply, fill #0
  Filled 2023-03-15: qty 1, 28d supply, fill #1
  Filled 2023-03-22 – 2023-05-01 (×2): qty 1, 28d supply, fill #2

## 2023-01-24 MED ORDER — TRIAMCINOLONE ACETONIDE 0.1 % EX OINT: 1.0000 | TOPICAL_OINTMENT | Freq: Two times a day (BID) | CUTANEOUS | 5 refills | Status: DC

## 2023-01-24 MED ORDER — PREGABALIN 100 MG PO CAPS
100.0000 mg | ORAL_CAPSULE | Freq: Three times a day (TID) | ORAL | 2 refills | Status: DC
Start: 2023-01-24 — End: 2023-04-29
  Filled 2023-01-24: qty 90, 30d supply, fill #0
  Filled 2023-03-22: qty 90, 30d supply, fill #1

## 2023-01-24 NOTE — Progress Notes (Signed)
HPI Patient presents today to [facility] to see pharmacy team for [facility] new start.  Past medical history includes asthma-COPD overlap, CAD. HTN, T2DM (self-administers Trulicity), eczema  Respiratory Medications Current regimen: Breztri 160-9-4.21mcg 2 puffs twice daily + montelukast 10mg  nightly Patient reports no known adherence challenges  OBJECTIVE Allergies  Allergen Reactions   Shellfish Allergy Anaphylaxis and Shortness Of Breath    Outpatient Encounter Medications as of 01/24/2023  Medication Sig   albuterol (PROVENTIL) (2.5 MG/3ML) 0.083% nebulizer solution Take 2.5 mg by nebulization 2 (two) times daily.   albuterol (VENTOLIN HFA) 108 (90 Base) MCG/ACT inhaler Inhale 1-2 puffs into the lungs every 4 (four) hours as needed for wheezing or shortness of breath.   Ascorbic Acid (VITAMIN C) 1000 MG tablet Take 1,000 mg by mouth daily.   aspirin EC 81 MG EC tablet Take 1 tablet (81 mg total) by mouth daily.   atorvastatin (LIPITOR) 40 MG tablet Take 1 tablet (40 mg total) by mouth daily.   Budeson-Glycopyrrol-Formoterol (BREZTRI AEROSPHERE) 160-9-4.8 MCG/ACT AERO Inhale 2 puffs into the lungs in the morning and at bedtime.   clopidogrel (PLAVIX) 75 MG tablet Take 1 tablet (75 mg total) by mouth daily.   Dulaglutide (TRULICITY) 3 MG/0.5ML SOPN Inject 3 mg as directed once a week. (Patient taking differently: Inject 3 mg into the skin every Sunday.)   Empagliflozin-metFORMIN HCl (SYNJARDY) 12.02-999 MG TABS Take 1 tablet by mouth 2 (two) times daily.   FLUoxetine (PROZAC) 10 MG capsule Take 1 capsule (10 mg total) by mouth daily.   fluticasone (FLONASE) 50 MCG/ACT nasal spray Place 1 spray into both nostrils daily. (Patient taking differently: Place 1 spray into both nostrils daily as needed for allergies.)   hydrOXYzine (ATARAX) 10 MG tablet Take 1 tablet (10 mg total) by mouth 3 (three) times daily as needed for anxiety.   ipratropium-albuterol (DUONEB) 0.5-2.5 (3) MG/3ML  SOLN Take 3 mLs by nebulization every 6 (six) hours as needed (for wheezing not relieved by albuterol inhaler). (Patient not taking: Reported on 11/22/2022)   Mepolizumab (NUCALA) 100 MG/ML SOAJ Inject 1 mL (100 mg total) into the skin every 28 (twenty-eight) days.   montelukast (SINGULAIR) 10 MG tablet Take 1 tablet (10 mg total) by mouth at bedtime.   Multiple Vitamins-Minerals (MULTIVITAMIN MEN 50+) TABS Take 1 tablet by mouth daily.   pioglitazone (ACTOS) 30 MG tablet Take 1 tablet (30 mg total) by mouth daily.   pregabalin (LYRICA) 100 MG capsule Take 1 capsule (100 mg total) by mouth 3 (three) times daily.   sildenafil (VIAGRA) 100 MG tablet Take 1 tablet (100 mg total) by mouth as needed for erectile dysfunction.   traZODone (DESYREL) 50 MG tablet Take 0.5-1 tablets (25-50 mg total) by mouth at bedtime as needed for sleep.   triamcinolone ointment (KENALOG) 0.1 % Apply 1 Application topically 2 (two) times daily.   [DISCONTINUED] Mepolizumab (NUCALA) 100 MG/ML SOAJ Inject 1 mL (100 mg total) into the skin every 28 (twenty-eight) days. Courier to pulm: [address], [place] [address]. Appt 01/03/23   No facility-administered encounter medications on file as of 01/24/2023.     Immunization History  Administered Date(s) Administered   Influenza,inj,Quad PF,6+ Mos 07/07/2018, 08/06/2019, 08/17/2021   Influenza-Unspecified 05/09/2020, 07/08/2022   PFIZER(Purple Top)SARS-COV-2 Vaccination 07/14/2020, 09/19/2020   PPD Test 07/12/2017   Pneumococcal Polysaccharide-23 10/24/2018   Tdap 09/19/2018     PFTs    Latest Ref Rng & Units 12/07/2022  2:35 PM 12/20/2015    9:03 AM  PFT Results  FVC-Pre L 2.89  2.70   FVC-Predicted Pre % 55  59   FVC-Post L 3.37  3.41   FVC-Predicted Post % 64  75   Pre FEV1/FVC % % 73  75   Post FEV1/FCV % % 73  70   FEV1-Pre L 2.11  2.02   FEV1-Predicted Pre % 54  56   FEV1-Post L 2.48  2.39   DLCO uncorrected ml/min/mmHg 20.08  18.71    DLCO UNC% % 67  51   DLCO corrected ml/min/mmHg 21.17    DLCO COR %Predicted % 71    DLVA Predicted % 102  83   TLC L 6.35  4.94   TLC % Predicted % 83  65   RV % Predicted % 113  87     Eosinophils Most recent blood eosinophil count was 300 cells/microL taken on 11/21/22.   IgE: 886 on 02/21/18  Assessment   Biologics training for mepolizumab (Nucala)  Goals of therapy: Mechanism of Action: Not fully understood. It does act an interleukin-5 (IL-5) antagonist monoclonal antibody that reduces the production and survival of eosinophils by blocking the binding of IL-5 to the alpha chain of the receptor complex on the eosinophil cell surface. Reviewed that Nucala is add-on medication and patient must continue maintenance inhaler regimen. Response to therapy: may take 3 months to 6 months to determine efficacy. Discussed that patients generally feel improvement sooner than 3 months.  Side effects: headache (19%), injection site reaction (7-15%), antibody development (6%), backache (5%), fatigue (5%)  Dose: 100 mg subcutaneously every 4 weeks  Administration/Storage:  Reviewed administration sites of thigh or abdomen (at least 2-3 inches away from abdomen). Reviewed the upper arm is only appropriate if caregiver is administering injection  Do not shake the reconstituted solution as this could lead to product foaming or precipitation. Solution should be clear to opalescent and colorless to pale yellow or pale brown, essentially particle free. Small air bubbles, however, are expected and acceptable. If particulate matter remains in the solution or if the solution appears cloudy or milky, discard the solution.  Reviewed storage of medication in refrigerator. Reviewed that Nucala can be stored at room temperature in unopened carton for up to 7 days.  Access: Approval of Nucala through: insurance  Patient self-administered Nucala /mL in left lower abdomen using [facility]-suppled  medication Nucala /mL autoinjector pen NDC: [phone] Lot: [id number] Expiration: 06/2025  Patient monitored for 30 minutes for adverse reaction.  Patient tolerated without issue. Reports no pain with injection. Injection site checked and no redness or swelling noted. Patient denies itchiness and irritation.  Medication Reconciliation  A drug regimen assessment was performed, including review of allergies, interactions, disease-state management, dosing and immunization history. Medications were reviewed with the patient, including name, instructions, indication, goals of therapy, potential side effects, importance of adherence, and safe use.  Drug interaction(s): none noted  PLAN Continue Nucala  every 4 weeks. Next dose is due 02/21/23 and every 4 weeks thereafter. Rx sent to: [facility]: [phone] . Patient provided with pharmacy phone number and advised to call later this week to schedule shipment to home.  Continue maintenance asthma regimen of: Breztri 160-9-4.47mcg 2 puffs twice daily + montelukast  nightly  All questions encouraged and answered.  Instructed patient to reach out with any further questions or concerns.  Thank you for allowing pharmacy to participate in this patient's care.  This  appointment required 45 minutes of patient care (this includes precharting, chart review, review of results, face-to-face care, etc.).  Chesley Mires, PharmD, MPH, BCPS, CPP Clinical Pharmacist (Rheumatology and Pulmonology)

## 2023-01-24 NOTE — Progress Notes (Signed)
Established Patient Office Visit  Subjective   Patient ID: Richard [name], male    DOB: 1961-01-10  Age: 62 y.o. MRN: [id number]  Chief Complaint  Patient presents with   Follow-up   Medication Refill    Albuterol Inhaler   [name] is here for follow-up of multiple issues including diabetes and COPD with asthma.  For his diabetes he thinks he has been doing okay he has been adherent to all of his medications he is motivated to stay off insulin.  He has not had any significant symptoms of hyperglycemia and has had no lows.  He is monitoring his feet daily.  For his COPD with asthma he has been following with pulmonology lately he is now on Breztri, Singulair, Nucala and albuterol as needed he needs a refill of albuterol.  He notes he has had a slightly productive cough over the last week corresponding with increased pollen in the environment.  He lives at an apartment building air filter is replaced by maintenance staff.  Unsure of when last filter was replaced.  He is due for his Nucala injection today.  He has not had any systemic symptoms of fever or chills.  He is also inquiring about Viagra he was previously prescribed this some years ago however is very expensive.  He notes his erections are semihard and not able to get a full erection despite desire.  He wonders about the website [facility], and asked me if it would be okay for him to gets Dallow fill through them for $100 a month.     Objective:     BP 119/71 (BP Location: Right Arm, Patient Position: Sitting, Cuff Size: Large)   Pulse 96   Temp 98.4 F (36.9 C) (Oral)   Resp (!) 24   Ht  (1.905 m)   Wt 221 lb 8 oz (100.5 kg)   SpO2 100% Comment: room air  BMI 27.69 kg/m  BP Readings from Last 3 Encounters:  01/24/23 119/71  12/27/22 128/63  12/05/22 120/60   Wt Readings from Last 3 Encounters:  01/24/23 221 lb 8 oz (100.5 kg)  12/27/22 224 lb (101.6 kg)  12/05/22 224 lb (101.6 kg)      Physical  Exam   Results for orders placed or performed in visit on 01/24/23  Glucose, capillary  Result Value Ref Range   Glucose-Capillary 131 (H) 70 - 99 mg/dL  POC Hbg H0Q  Result Value Ref Range   Hemoglobin A1C 7.3 (A) 4.0 - 5.6 %   HbA1c POC (<> result, manual entry)     HbA1c, POC (prediabetic range)     HbA1c, POC (controlled diabetic range)      Last CBC Lab Results  Component Value Date   WBC 6.2 11/22/2022   HGB 12.9 (L) 11/22/2022   HCT 39.4 11/22/2022   MCV 80.7 11/22/2022   MCH 26.4 11/22/2022   RDW 14.5 11/22/2022   PLT 274 11/22/2022   Last metabolic panel Lab Results  Component Value Date   GLUCOSE 248 (H) 11/22/2022   NA 134 (L) 11/22/2022   K 4.5 11/22/2022   CL 100 11/22/2022   CO2 23 11/22/2022   BUN 17 11/22/2022   CREATININE 1.07 11/22/2022   GFRNONAA >60 11/22/2022   CALCIUM 9.1 11/22/2022   PHOS 4.8 (H) 08/22/2022   PROT 6.9 08/22/2022   ALBUMIN 3.6 08/22/2022   LABGLOB 2.5 08/17/2021   AGRATIO 1.7 08/17/2021   BILITOT 0.6 08/22/2022   ALKPHOS 92 08/22/2022  AST 25 08/22/2022   ALT 20 08/22/2022   ANIONGAP 11 11/22/2022   Last hemoglobin A1c Lab Results  Component Value Date   HGBA1C 7.3 (A) 01/24/2023      The 10-year ASCVD risk score (Arnett DK, et Richard., 2019) is: 31.9%    Assessment & Plan:   Problem List Items Addressed This Visit       Cardiovascular and Mediastinum   Type 2 diabetes mellitus with peripheral artery disease - Primary (Chronic)    A1c much better at 7.3% we will continue him on Synjardy, pioglitazone, Trulicity.      Relevant Medications   sildenafil (VIAGRA) 100 MG tablet   Other Relevant Orders   POC Hbg A1C (Completed)   Glucose, capillary (Completed)     Respiratory   Asthma-COPD overlap syndrome (Chronic)    Overall breathing comfortably on my exam.  May have a slight exacerbation due to pollen.  Advised him to try to obtain a higher Merv value air filter and wrote him a note for his apartment  complex.  Otherwise continue on Breztri and Mepolizumab, Singulair as directed by pulmonology.      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  If this is ineffective we may try Cialis but I think sildenafil will be the cheaper option.      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    Richard Rung, DO

## 2023-01-24 NOTE — Assessment & Plan Note (Signed)
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  If this is ineffective we may try Cialis but I think sildenafil will be the cheaper option.

## 2023-01-24 NOTE — Assessment & Plan Note (Signed)
A1c much better at 7.3% we will continue him on Synjardy, pioglitazone, Trulicity.

## 2023-01-24 NOTE — Patient Instructions (Addendum)
Your next NUCALA dose is due on 02/21/23, 03/21/23, and every 4 weeks thereafter  CONTINUE Breztri 2 puffs twice daily  and montelukast  nightly  Your prescription will be shipped from [facility]. Their phone number is [phone]. They will call you in 2-3 weeks to schedule your shipment and confirm address. They will mail your medication to your home.  You will need to be seen by your provider in 3 to 4 months to assess how NUCALA is working for you. Please ensure you have a follow-up appointment scheduled in July or August 2024. Call our clinic if you need to make this appointment.  How to manage an injection site reaction: Remember the 5 C's: COUNTER - leave on the counter at least 30 minutes but up to overnight to bring medication to room temperature. This may help prevent stinging COLD - place something cold (like an ice gel pack or cold water bottle) on the injection site just before cleansing with alcohol. This may help reduce pain CLARITIN - use Claritin (generic name is loratadine) for the first two weeks of treatment or the day of, the day before, and the day after injecting. This will help to minimize injection site reactions CORTISONE CREAM - apply if injection site is irritated and itching CALL ME - if injection site reaction is bigger than the size of your fist, looks infected, blisters, or if you develop hives

## 2023-01-24 NOTE — Assessment & Plan Note (Signed)
Overall breathing comfortably on my exam.  May have a slight exacerbation due to pollen.  Advised him to try to obtain a higher Merv value air filter and wrote him a note for his apartment complex.  Otherwise continue on Breztri and Mepolizumab, Singulair as directed by pulmonology.

## 2023-01-24 NOTE — Assessment & Plan Note (Signed)
Lyrica helping will refill 100 mg 3 times daily

## 2023-01-25 ENCOUNTER — Ambulatory Visit: Payer: 59 | Admitting: Podiatry

## 2023-01-28 ENCOUNTER — Ambulatory Visit (INDEPENDENT_AMBULATORY_CARE_PROVIDER_SITE_OTHER): Payer: 59 | Admitting: Clinical

## 2023-01-28 ENCOUNTER — Encounter (HOSPITAL_COMMUNITY): Payer: Self-pay | Admitting: Clinical

## 2023-01-28 DIAGNOSIS — F332 Major depressive disorder, recurrent severe without psychotic features: Secondary | ICD-10-CM | POA: Diagnosis not present

## 2023-01-28 DIAGNOSIS — F1421 Cocaine dependence, in remission: Secondary | ICD-10-CM

## 2023-01-28 DIAGNOSIS — F411 Generalized anxiety disorder: Secondary | ICD-10-CM

## 2023-01-28 NOTE — Progress Notes (Signed)
THERAPIST PROGRESS NOTE  Session Time: 8:05-9:05am  Session # 6  Participation Level: Active  Behavioral Response: Casual and Neat Alert Euthymic, Hopeful  Type of Therapy: Individual Therapy  Treatment Goals addressed:  LTG: score less than 5 on the Generalized Anxiety Disorder 7 Scale (GAD-7)  STG: practice problem solving skills 3 times per week for the next 4 weeks  LTG: Reduce frequency, intensity, and duration of depression symptoms so that daily functioning is improved  STG: identify cognitive patterns and beliefs that support depression  LTG: continue to maintain his over 30-year sobriety from cocaine and alcohol   ProgressTowards Goals: Progressing  Interventions: CBT and Supportive  Summary: Richard [name] is a 62 y.o. male who presents with depression and anxiety as well as in remission for over 30 years from a crack cocaine addiction. He reported that he has made the decision to take the summer semester off from school in order to rest because he is so tired physically and emotionally.  He feels quite stressed currently due to upcoming due assignments and final exams.  His knee is hurting today which contributes to malaise.  He feels that the medication prescribed by psychiatrist is working, specifically the Prozac.  He is not feeling overly anxious as often, feels more frequently that he has the ability to cope.  The Trazodone did not help him to sleep so he stopped taking it.  The Prozac is sedating so he has not been using the PRN Atarax.  He has delayed taking his Prozac until later in the day after school because it makes him lethargic (does not make him sleepy).  He was encouraged to talk to doctor next week about this, told he will likely be told it is okay to move it to bedtime if it is causing sedation.  Patient talked about how people in his life are acting a little differently toward him, spending more time in person or on the phone with him.  His granddaughter  will be having her 9th birthday soon and he does not want her to lose her innocence.  We talked about how that will happen gradually in her life, but is inevitable.  He was in active addiction when his own daughter was 9yo and has an entirely different mindset now than he did back then.  He did talk about not wanting to let out that previous monster from inside him, saying he does not want to use drugs but he is scared sometimes that the behaviors from that previous time of life will return.  CSW used MI to approach patient as to what he needs and he eventually stated he needs to go back to "the rooms" of Narcotics Anonymous.  He was able to identify that giving back to others is one key to recovery that is missing in his life.  He spoke of a friend in [place] that he associated with while in active addiction, and how that friend has the same amount of clean time but has more serenity than patient does.  We talked about the physical, emotional, and spiritual recovery that are at the core of Alcoholics Anonymous and other 12-step groups such as NA.  CSW cautioned him about choosing a group to attend where there is a lot of recovery present.  Patient expressed there has been some conflict with girlfriend being aggressive verbally which is starting to bother him.  She has even confronted him about needing to be more aggressive himself , saying that as  [place] they are not "soft."  He has thought of getting angry and showing her the way he used to be, but CSW encouraged him not to take that route but to consider using "I" statements which were then explained.  Suicidal/Homicidal: No   Therapist Response: Patient is making progress AEB statements that he feels the medicines are working and he is feeling better, more relaxed, and less anxious.  He states therapy sessions have helped him a great deal and that he is starting to feel "airy" and light.  He asked for CSW's perspective and was told CSW sees a deep  thinker who is willing to consider his thoughts and attitudes in order to determine if they are working for him.  He likes the person he has become and does not want to return to the being the person he was, was encouraged to think of it as the person we used to be helps [place] to become the person we want to be.  CSW provided mood monitoring and treatment progress review in the context of this episode of treatment.   Patient reported that his mood has been "better".   Patient was able to explore how the medicine is positively impacting his brain chemistry.    CSW gave patient the opportunity to explore thoughts and feelings associated with current life situations and past/present external stressors as desired.   CSW encouraged patient's expression of feelings and validated patient's thoughts, using empathy, active listening, open body language, and unconditional positive regard.  Patient demonstrated an orientation to time, place, person and situation.      Recommendations:  Return to therapy in 2 weeks, engage in self care behaviors, practice using "I" statements as taught in session, start researching NA groups to go to  Plan: Return again in 2 weeks.  Diagnosis:  Encounter Diagnoses  Name Primary?   Severe episode of recurrent major depressive disorder, without psychotic features Yes   Generalized anxiety disorder    Cocaine use disorder, severe, in sustained remission       Collaboration of Care: Psychiatrist AEB - psychiatric provider can read therapy notes, and vice versa  Patient/Guardian was advised Release of Information must be obtained prior to any record release in order to collaborate their care with an outside provider. Patient/Guardian was advised if they have not already done so to contact the registration department to sign all necessary forms in order for [place] to release information regarding their care.   Consent: Patient/Guardian gives verbal consent for treatment and assignment  of benefits for services provided during this visit. Patient/Guardian expressed understanding and agreed to proceed.     Lynnell Chad, LCSW 01/28/2023

## 2023-02-06 ENCOUNTER — Encounter (HOSPITAL_COMMUNITY): Payer: Self-pay | Admitting: Psychiatry

## 2023-02-06 ENCOUNTER — Encounter: Payer: Self-pay | Admitting: Gastroenterology

## 2023-02-06 ENCOUNTER — Ambulatory Visit (HOSPITAL_BASED_OUTPATIENT_CLINIC_OR_DEPARTMENT_OTHER): Payer: 59 | Admitting: Psychiatry

## 2023-02-06 VITALS — BP 135/74 | HR 101 | Ht 75.0 in

## 2023-02-06 DIAGNOSIS — F411 Generalized anxiety disorder: Secondary | ICD-10-CM

## 2023-02-06 DIAGNOSIS — F332 Major depressive disorder, recurrent severe without psychotic features: Secondary | ICD-10-CM | POA: Diagnosis not present
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
## 2023-02-10 ENCOUNTER — Emergency Department (HOSPITAL_COMMUNITY)
Admission: EM | Admit: 2023-02-10 | Discharge: 2023-02-10 | Disposition: A | Discharge status: Home / Self Care | Payer: 59 | Attending: Emergency Medicine | Admitting: Emergency Medicine

## 2023-02-10 ENCOUNTER — Emergency Department (HOSPITAL_BASED_OUTPATIENT_CLINIC_OR_DEPARTMENT_OTHER): Payer: 59

## 2023-02-10 ENCOUNTER — Emergency Department (HOSPITAL_COMMUNITY): Payer: 59

## 2023-02-10 ENCOUNTER — Encounter (HOSPITAL_COMMUNITY): Payer: Self-pay | Admitting: Emergency Medicine

## 2023-02-10 DIAGNOSIS — J4521 Mild intermittent asthma with (acute) exacerbation: Secondary | ICD-10-CM | POA: Diagnosis not present

## 2023-02-10 DIAGNOSIS — Z7951 Long term (current) use of inhaled steroids: Secondary | ICD-10-CM | POA: Diagnosis not present

## 2023-02-10 DIAGNOSIS — R0602 Shortness of breath: Secondary | ICD-10-CM | POA: Diagnosis present

## 2023-02-10 DIAGNOSIS — E119 Type 2 diabetes mellitus without complications: Secondary | ICD-10-CM | POA: Diagnosis not present

## 2023-02-10 DIAGNOSIS — R000 Tachycardia, unspecified: Secondary | ICD-10-CM | POA: Insufficient documentation

## 2023-02-10 DIAGNOSIS — R609 Edema, unspecified: Secondary | ICD-10-CM

## 2023-02-10 DIAGNOSIS — M1710 Unilateral primary osteoarthritis, unspecified knee: Secondary | ICD-10-CM | POA: Diagnosis not present

## 2023-02-10 LAB — I-STAT CHEM 8, ED
BUN: 16 mg/dL (ref 8–23)
Calcium, Ion: 1.12 mmol/L — ABNORMAL LOW (ref 1.15–1.40)
Chloride: 103 mmol/L (ref 98–111)
Creatinine, Ser: 1.3 mg/dL — ABNORMAL HIGH (ref 0.61–1.24)
Glucose, Bld: 130 mg/dL — ABNORMAL HIGH (ref 70–99)
HCT: 35 % — ABNORMAL LOW (ref 39.0–52.0)
Hemoglobin: 11.9 g/dL — ABNORMAL LOW (ref 13.0–17.0)
Potassium: 4.3 mmol/L (ref 3.5–5.1)
Sodium: 139 mmol/L (ref 135–145)
TCO2: 23 mmol/L (ref 22–32)

## 2023-02-10 LAB — CBC WITH DIFFERENTIAL/PLATELET
Abs Immature Granulocytes: 0.03 10*3/uL (ref 0.00–0.07)
Basophils Absolute: 0.1 10*3/uL (ref 0.0–0.1)
Basophils Relative: 1 %
Eosinophils Absolute: 0.2 10*3/uL (ref 0.0–0.5)
Eosinophils Relative: 4 %
HCT: 34.2 % — ABNORMAL LOW (ref 39.0–52.0)
Hemoglobin: 10.3 g/dL — ABNORMAL LOW (ref 13.0–17.0)
Immature Granulocytes: 1 %
Lymphocytes Relative: 24 %
Lymphs Abs: 1.2 10*3/uL (ref 0.7–4.0)
MCH: 22.2 pg — ABNORMAL LOW (ref 26.0–34.0)
MCHC: 30.1 g/dL (ref 30.0–36.0)
MCV: 73.5 fL — ABNORMAL LOW (ref 80.0–100.0)
Monocytes Absolute: 0.6 10*3/uL (ref 0.1–1.0)
Monocytes Relative: 12 %
Neutro Abs: 2.8 10*3/uL (ref 1.7–7.7)
Neutrophils Relative %: 58 %
Platelets: 390 10*3/uL (ref 150–400)
RBC: 4.65 MIL/uL (ref 4.22–5.81)
RDW: 17.8 % — ABNORMAL HIGH (ref 11.5–15.5)
WBC: 4.8 10*3/uL (ref 4.0–10.5)
nRBC: 0 % (ref 0.0–0.2)

## 2023-02-10 LAB — I-STAT VENOUS BLOOD GAS, ED
Acid-base deficit: 2 mmol/L (ref 0.0–2.0)
Bicarbonate: 22.9 mmol/L (ref 20.0–28.0)
Calcium, Ion: 1.13 mmol/L — ABNORMAL LOW (ref 1.15–1.40)
HCT: 35 % — ABNORMAL LOW (ref 39.0–52.0)
Hemoglobin: 11.9 g/dL — ABNORMAL LOW (ref 13.0–17.0)
O2 Saturation: 89 %
Potassium: 4.3 mmol/L (ref 3.5–5.1)
Sodium: 139 mmol/L (ref 135–145)
TCO2: 24 mmol/L (ref 22–32)
pCO2, Ven: 38 mmHg — ABNORMAL LOW (ref 44–60)
pH, Ven: 7.388 (ref 7.25–7.43)
pO2, Ven: 56 mmHg — ABNORMAL HIGH (ref 32–45)

## 2023-02-10 LAB — TROPONIN I (HIGH SENSITIVITY)
Troponin I (High Sensitivity): 6 ng/L (ref <18)
Troponin I (High Sensitivity): 5 ng/L (ref <18)

## 2023-02-10 LAB — BASIC METABOLIC PANEL
Anion gap: 14 (ref 5–15)
BUN: 14 mg/dL (ref 8–23)
CO2: 21 mmol/L — ABNORMAL LOW (ref 22–32)
Calcium: 9.3 mg/dL (ref 8.9–10.3)
Chloride: 102 mmol/L (ref 98–111)
Creatinine, Ser: 1.34 mg/dL — ABNORMAL HIGH (ref 0.61–1.24)
GFR, Estimated: 60 mL/min — ABNORMAL LOW (ref >60)
Glucose, Bld: 128 mg/dL — ABNORMAL HIGH (ref 70–99)
Potassium: 4.2 mmol/L (ref 3.5–5.1)
Sodium: 137 mmol/L (ref 135–145)

## 2023-02-10 LAB — BRAIN NATRIURETIC PEPTIDE: B Natriuretic Peptide: 55.4 pg/mL (ref 0.0–100.0)

## 2023-02-10 MED ORDER — LORAZEPAM 2 MG/ML IJ SOLN: 1.0000 mg | Freq: Once | INTRAMUSCULAR | Status: DC

## 2023-02-10 MED ORDER — IPRATROPIUM-ALBUTEROL 0.5-2.5 (3) MG/3ML IN SOLN
3.0000 mL | Freq: Once | RESPIRATORY_TRACT | Status: AC
  Administered 2023-02-10: 3 mL via RESPIRATORY_TRACT
  Filled 2023-02-10: qty 3

## 2023-02-10 MED ORDER — MORPHINE SULFATE (PF) 4 MG/ML IV SOLN
4.0000 mg | Freq: Once | INTRAVENOUS | Status: AC
  Administered 2023-02-10: 4 mg via INTRAVENOUS
  Filled 2023-02-10: qty 1

## 2023-02-10 MED ORDER — IPRATROPIUM BROMIDE 0.02 % IN SOLN
1.0000 mg | Freq: Once | RESPIRATORY_TRACT | Status: AC
  Administered 2023-02-10: 1 mg via RESPIRATORY_TRACT
  Filled 2023-02-10: qty 5

## 2023-02-10 MED ORDER — PREDNISONE 20 MG PO TABS: ORAL_TABLET | ORAL | 0 refills | Status: DC

## 2023-02-10 MED ORDER — SODIUM CHLORIDE 0.9 % IV BOLUS
1000.0000 mL | Freq: Once | INTRAVENOUS | Status: AC
  Administered 2023-02-10: 1000 mL via INTRAVENOUS

## 2023-02-10 MED ORDER — IOHEXOL 350 MG/ML SOLN
75.0000 mL | Freq: Once | INTRAVENOUS | Status: AC | PRN
  Administered 2023-02-10: 75 mL via INTRAVENOUS

## 2023-02-10 MED ORDER — ALBUTEROL SULFATE (2.5 MG/3ML) 0.083% IN NEBU
15.0000 mg/h | INHALATION_SOLUTION | Freq: Once | RESPIRATORY_TRACT | Status: AC
  Administered 2023-02-10: 15 mg/h via RESPIRATORY_TRACT
  Filled 2023-02-10: qty 3

## 2023-02-10 MED ORDER — METHYLPREDNISOLONE SODIUM SUCC 125 MG IJ SOLR
125.0000 mg | Freq: Once | INTRAMUSCULAR | Status: AC
  Administered 2023-02-10: 125 mg via INTRAVENOUS
  Filled 2023-02-10: qty 2

## 2023-02-10 NOTE — Progress Notes (Signed)
VASCULAR LAB    Bilateral lower extremity venous duplex has been performed.  See CV proc for preliminary results.  Messaged negative results to Dr. Silverio Lay via secure chat  Sherren Kerns, RVT 02/10/2023, 6:08 PM

## 2023-02-10 NOTE — ED Provider Notes (Signed)
[place] EMERGENCY DEPARTMENT AT [facility] Provider Note   CSN: [id number] Arrival date & time: 02/10/23  1559     History  Chief Complaint  Patient presents with   Foot Pain   Shortness of Breath    Richard [name] is a 62 y.o. male history of asthma, diabetes, here presenting with shortness of breath.  Patient states that he has been short of breath since this morning.  He states that he has chronic right leg swelling that is getting worse.  He states that he has a history of asthma and had admission for asthma exacerbation 3 months ago.  Denies any fevers.  Denies any recent travel  The history is provided by the patient.       Home Medications Prior to Admission medications   Medication Sig Start Date End Date Taking? Authorizing Provider  albuterol (PROVENTIL) (2.5 MG/3ML) 0.083% nebulizer solution Take 2.5 mg by nebulization 2 (two) times daily.    [provider]  albuterol (VENTOLIN HFA) 108 (90 Base) MCG/ACT inhaler Inhale 1-2 puffs into the lungs every 4 (four) hours as needed for wheezing or shortness of breath. 01/24/23   [name], DO  Ascorbic Acid (VITAMIN C) 1000 MG tablet Take 1,000 mg by mouth daily.    [provider]  aspirin EC 81 MG EC tablet Take 1 tablet (81 mg total) by mouth daily. 09/26/15   Burns, [name], MD  atorvastatin (LIPITOR) 40 MG tablet Take 1 tablet (40 mg total) by mouth daily. 07/21/22   Marolyn Haller, MD  Budeson-Glycopyrrol-Formoterol (BREZTRI AEROSPHERE) 160-9-4.8 MCG/ACT AERO Inhale 2 puffs into the lungs in the morning and at bedtime. 12/05/22   [name], MD  clopidogrel (PLAVIX) 75 MG tablet Take 1 tablet (75 mg total) by mouth daily. 11/19/22   Alene Mires, NP  Dulaglutide (TRULICITY) 3 MG/0.5ML SOPN Inject 3 mg as directed once a week. Patient taking differently: Inject 3 mg into the skin every Sunday. 10/26/22   [name], DO  Empagliflozin-metFORMIN HCl (SYNJARDY)  12.02-999 MG TABS Take 1 tablet by mouth 2 (two) times daily. 07/21/22   Marolyn Haller, MD  FLUoxetine (PROZAC) 10 MG capsule Take 1 capsule (10 mg total) by mouth daily. 01/02/23 01/02/24  Stasia Cavalier, MD  fluticasone [facility]) 50 MCG/ACT nasal spray Place 1 spray into both nostrils daily. Patient taking differently: Place 1 spray into both nostrils daily as needed for allergies. 08/24/22   Lyndle Herrlich, MD  hydrOXYzine (ATARAX) 10 MG tablet Take 1 tablet (10 mg total) by mouth 3 (three) times daily as needed for anxiety. 01/02/23   Stasia Cavalier, MD  ipratropium-albuterol (DUONEB) 0.5-2.5 (3) MG/3ML SOLN Take 3 mLs by nebulization every 6 (six) hours as needed (for wheezing not relieved by albuterol inhaler). 07/21/22   Marolyn Haller, MD  Mepolizumab (NUCALA) 100 MG/ML SOAJ Inject 1 mL (100 mg total) into the skin every 28 (twenty-eight) days. 01/24/23   [name], MD  montelukast (SINGULAIR) 10 MG tablet Take 1 tablet (10 mg total) by mouth at bedtime. 03/08/22   [name], DO  Multiple Vitamins-Minerals (MULTIVITAMIN MEN 50+) TABS Take 1 tablet by mouth daily.    [provider]  pioglitazone (ACTOS) 30 MG tablet Take 1 tablet (30 mg total) by mouth daily. 06/14/22   [name], DO  pregabalin (LYRICA) 100 MG capsule Take 1 capsule (100 mg total) by mouth 3 (three) times daily. 01/24/23   Hoffman,  Marthenia Rolling, DO  sildenafil (VIAGRA) 100 MG tablet Take 1 tablet (100 mg total) by mouth as needed for erectile dysfunction. 01/24/23   [name], DO  triamcinolone ointment (KENALOG) 0.1 % Apply 1 Application topically 2 (two) times daily. 01/24/23   [name], DO      Allergies    Shellfish allergy    Review of Systems   Review of Systems  Respiratory:  Positive for shortness of breath.   Musculoskeletal:        Right leg pain and swelling  All other systems reviewed and are negative.   Physical Exam Updated Vital Signs BP 115/65   Pulse  (!) 102   Temp 98 F (36.7 C) (Oral)   Resp (!) 23   Ht 6\' 3"  (1.905 m)   Wt 104.8 kg   SpO2 100%   BMI 28.87 kg/m  Physical Exam Vitals and nursing note reviewed.  Constitutional:      Comments: Tachypneic and anxious  HENT:     Head: Normocephalic.     Mouth/Throat:     Mouth: Mucous membranes are moist.  Eyes:     Extraocular Movements: Extraocular movements intact.     Pupils: Pupils are equal, round, and reactive to light.  Cardiovascular:     Rate and Rhythm: Regular rhythm. Tachycardia present.  Pulmonary:     Comments: Tachypneic and mild retractions.  Mild diffuse wheezing Abdominal:     General: Bowel sounds are normal.     Palpations: Abdomen is soft.  Musculoskeletal:     Cervical back: Normal range of motion and neck supple.     Comments: Right leg with 1+ edema and right calf tenderness.  Normal range of motion of the right knee.  No obvious cellulitis  Skin:    General: Skin is warm.     Capillary Refill: Capillary refill takes less than 2 seconds.  Neurological:     General: No focal deficit present.  Psychiatric:        Mood and Affect: Mood normal.        Behavior: Behavior normal.     ED Results / Procedures / Treatments   Labs (all labs ordered are listed, but only abnormal results are displayed) Labs Reviewed  BASIC METABOLIC PANEL  CBC WITH DIFFERENTIAL/PLATELET  BRAIN NATRIURETIC PEPTIDE  I-STAT VENOUS BLOOD GAS, ED  I-STAT CHEM 8, ED  TROPONIN I (HIGH SENSITIVITY)    EKG EKG Interpretation  Date/Time:  Sunday Feb 10 2023 16:56:31 EDT Ventricular Rate:  105 PR Interval:  176 QRS Duration: 72 QT Interval:  326 QTC Calculation: 430 R Axis:   15 Text Interpretation: Sinus tachycardia Possible Left atrial enlargement Anterior infarct , age undetermined Abnormal ECG When compared with ECG of 21-Nov-2022 19:25, PREVIOUS ECG IS PRESENT Confirmed by Richardean Canal [phone]) on 02/10/2023 5:13:22 PM  Radiology No results  found.  Procedures Procedures    Medications Ordered in ED Medications  sodium chloride 0.9 % bolus 1,000 mL (has no administration in time range)  methylPREDNISolone sodium succinate (SOLU-MEDROL) 125 mg/2 mL injection 125 mg (has no administration in time range)  ipratropium-albuterol (DUONEB) 0.5-2.5 (3) MG/3ML nebulizer solution 3 mL (3 mLs Nebulization Given 02/10/23 1651)  albuterol (PROVENTIL) (2.5 MG/3ML) 0.083% nebulizer solution (15 mg/hr Nebulization Given 02/10/23 1736)  ipratropium (ATROVENT) nebulizer solution 1 mg (1 mg Nebulization Given 02/10/23 1736)    ED Course/ Medical Decision Making/ A&P  Medical Decision Making Azul Klopfenstein is a 62 y.o. male here presenting with shortness of breath and right leg swelling and pain.  I think likely asthma exacerbation.  However given unilateral leg swelling, consider DVT and PE.  Will get DVT study and CT PE study.  Will also treat for asthma with Solu-Medrol and continuous neb.  8:59 PM X-ray showed arthritis in the right knee.  Patient has no DVT.  CTA showed no PE.  Patient is feeling much better now.  Patient has minimal wheezing.  He is not hypoxic.  At this point he is stable for discharge  Problems Addressed: Arthritis of knee: acute illness or injury Mild intermittent asthma with exacerbation: acute illness or injury  Amount and/or Complexity of Data Reviewed Labs: ordered. Decision-making details documented in ED Course. Radiology: ordered and independent interpretation performed. Decision-making details documented in ED Course. ECG/medicine tests: ordered and independent interpretation performed. Decision-making details documented in ED Course.  Risk Prescription drug management.    Final Clinical Impression(s) / ED Diagnoses Final diagnoses:  None    Rx / DC Orders ED Discharge Orders     None         Charlynne Pander, MD 02/10/23 2100

## 2023-02-10 NOTE — ED Notes (Signed)
RN updated daughter per pts request

## 2023-02-10 NOTE — ED Triage Notes (Signed)
Pt became very anxious in triage when trying to start a neb treatment  , sats remained 95-100 ,on room air , pt states that he does take he takes mends for anxiety but has not had it today

## 2023-02-10 NOTE — Discharge Instructions (Addendum)
You likely have asthma exacerbation.  Please take prednisone as prescribed  Use your albuterol every 4 hours as needed  Please follow-up with your primary care doctor regarding her asthma and follow-up with Dr. Eulah Pont regarding your leg swelling and knee pain from arthritis  Return to ER if you have worsening shortness of breath or leg swelling

## 2023-02-10 NOTE — ED Triage Notes (Signed)
Pt here from home with c/o right foot pain and swelling that  he noticed three days ago , pt is a diabetic and noticed small wound to the bottom of his foot

## 2023-02-10 NOTE — ED Provider Triage Note (Signed)
Emergency Medicine Provider Triage Evaluation Note  Richard [name] , a 62 y.o. male  was evaluated in triage.  Pt states that swelling and pain in his right leg especially into the lower leg and foot brought him in but now he is feeling very short of breath with chest pain.  No recent fever, chills, abdominal pain, nausea, vomiting, or diarrhea.  States he has a history of asthma and feels like it is acting up.  Difficult to obtain history secondary to clinical condition.  Review of Systems  Positive: See HPI Negative: See HPI  Physical Exam  BP 135/87   Pulse 100   Temp 98 F (36.7 C) (Oral)   Resp 18   Ht 6\' 3"  (1.905 m)   Wt 104.8 kg   SpO2 100%   BMI 28.87 kg/m  Gen:   Awake, moderate distress Resp:  Increased respiratory effort at rest worsens with short phrases, moderate inspiratory and expiratory wheezing MSK:   Moves extremities without difficulty, 1+ pitting edema to the right lower extremity and significant tenderness to palpation of the entire right lower leg and foot, 2+ DP and PT pulses, no increased warmth Other:  Alert and oriented, abdomen soft and nontender, mild tachycardia with regular rhythm  Medical Decision Making  Medically screening exam initiated at 4:47 PM.  Appropriate orders placed.  Darrell Emrich was informed that the remainder of the evaluation will be completed by another provider, this initial triage assessment does not replace that evaluation, and the importance of remaining in the ED until their evaluation is complete.     Tonette Lederer, PA-C 02/10/23 1650

## 2023-02-12 ENCOUNTER — Other Ambulatory Visit: Payer: Self-pay

## 2023-02-12 ENCOUNTER — Other Ambulatory Visit (HOSPITAL_COMMUNITY): Payer: Self-pay

## 2023-02-13 ENCOUNTER — Other Ambulatory Visit: Payer: Self-pay

## 2023-02-14 ENCOUNTER — Ambulatory Visit (HOSPITAL_COMMUNITY): Payer: 59 | Admitting: Clinical

## 2023-02-22 ENCOUNTER — Ambulatory Visit (INDEPENDENT_AMBULATORY_CARE_PROVIDER_SITE_OTHER): Payer: 59 | Admitting: Clinical

## 2023-02-22 ENCOUNTER — Encounter (HOSPITAL_COMMUNITY): Payer: Self-pay | Admitting: Clinical

## 2023-02-22 DIAGNOSIS — F332 Major depressive disorder, recurrent severe without psychotic features: Secondary | ICD-10-CM | POA: Diagnosis not present

## 2023-02-22 DIAGNOSIS — F411 Generalized anxiety disorder: Secondary | ICD-10-CM

## 2023-02-22 DIAGNOSIS — F1421 Cocaine dependence, in remission: Secondary | ICD-10-CM

## 2023-02-22 NOTE — Progress Notes (Signed)
THERAPIST PROGRESS NOTE  Session Time: 8:02-9:00am  Session #7  Participation Level: Active  Behavioral Response: Casual and Neat Alert Euthymic and Anxious  Type of Therapy: Individual Therapy  Treatment Goals addressed:  LTG: score less than 5 on the Generalized Anxiety Disorder 7 Scale (GAD-7)  STG: practice problem solving skills 3 times per week for the next 4 weeks  LTG: Reduce frequency, intensity, and duration of depression symptoms so that daily functioning is improved  STG: identify cognitive patterns and beliefs that support depression  LTG: continue to maintain his over 30-year sobriety from cocaine and alcohol   ProgressTowards Goals: Progressing  Interventions: CBT and Supportive  Summary: Richard [name] is a 62 y.o. male who presents with depression and anxiety as well as a crack cocaine addiction in remission for over 30 years.  He was using a cane and had a large brace on his right knee, said he is in pain and needs a knee replacement surgery.  There was a great deal of anxiety surrounding taking any pain medication and going through what he has heard is a horrible recovery period.  From personal experience, CSW was able to share that recovery from knee replacement surgery is painful but it is replacing the old unrelenting pain, and eventually recedes and many cases goes away altogether.  Despite his doctor telling him his medicine is not addictive, he feels it could be, states that is his addict mind working.  CSW led him through Socratic questioning to arrive to a different, more helpful conclusion.  He stated he has gone to a couple of Narcotics Anonymous meetings and enjoyed them, is not ready to open up and share yet.  He talked at length about who feels comfortable sharing with and who he does not.  CSW tried pointing out various cognitive distortions that may be interfering with proper processing.  With his addiction, he admitted  he has black & white thinking.  He  was told gently that in saying to himself that he cannot take any medicine to help with  pain because it will lead back to his addiction, he is not allowing for the doctors' admonitions that allowing the pain to grow to an unmanageable level is more like to result in self-medicating and relapse.  He was also cautioned not to fall into the trap of telling himself "I got this" regarding his addiction and he stated he cannot stay sober "just for today" but does it moments at a time, fighting it every second.  He became emotional and distraught, was led back to a calm space by reassurance from CSW that he can stay sober through such a surgery.  He informed CSW that he has ended his relationship with the girlfriend because it was too much pressure and too many expectations.  This also led him to expect more from himself that became too stressful to maintain.  He expressed the thought that "If she isn't happy, it must be me" which was then discussed as a cognitive distortion he was able to comprehend.  He talked about his relationship with his daughter being solid but still facing some challenges.  He has since last session told his daughter all that is going on with him emotionally and that he is seeing a therapist.  He explained he does not want attention and she apologized for assuming that to be what was occurring.  He is now irritated that she is checking up on him more frequently, however, and is trying  to look at it as merely proof of her love and concern.  He spoke about his 9yo granddaughter being his greatest priority other than his Higher Power whom he calls God.  CSW demonstrated to him how we can diagram on paper the outflow of his emotional energy and the influx such energy, in other words the areas in which he gives of himself and the areas where he takes.  He concluded that he gives more to his family than he receives, more to his schooling than he receives, more in the relationship he was in than he  received and receives more from his faith than he gives.  He expressed feeling that he can and should do more in that area.  CSW processed the use of the word "should" again.  CSW asked him to continue learning and processing the cognitive distortions.  Suicidal/Homicidal: No   Therapist Response: Patient is making progress AEB his personal statements that he is benefiting from therapy and his multiple statements of feeling that he is making progress in therapy.  He described at length how he is able to relax while talking because he is mirroring the relaxed posture of CSW.  He was provided with feedback and positive strokes for his work in session and between sessions.  He was encouraged to challenge his thinking, particular when he is overwhelmed with anxiety, and to put them through the litmus test as explained.  He feels he will be calmer now that he is out of school for the summer.  CSW gave patient the opportunity to explore thoughts and feelings associated with current life situations and past/present external stressors as desired.   CSW encouraged patient's expression of feelings and validated patient's thoughts, using empathy, active listening, open body language, and unconditional positive regard.  Patient demonstrated an orientation to time, place, person and situation.     Recommendations:  Return to therapy in 2 weeks, engage in self care behaviors, practice using "I" statements as taught in session, start researching NA groups to go to  Plan: Return again in 2 weeks. Next appointment:  5/23 at 8am  Diagnosis:  Encounter Diagnoses  Name Primary?   Severe episode of recurrent major depressive disorder, without psychotic features (HCC) Yes   Generalized anxiety disorder    Cocaine use disorder, severe, in sustained remission (HCC)    Collaboration of Care: Psychiatrist AEB - psychiatric provider can read therapy notes, CSW read psychiatric note prior to session  Patient/Guardian was  advised Release of Information must be obtained prior to any record release in order to collaborate their care with an outside provider. Patient/Guardian was advised if they have not already done so to contact the registration department to sign all necessary forms in order for [place] to release information regarding their care.   Consent: Patient/Guardian gives verbal consent for treatment and assignment of benefits for services provided during this visit. Patient/Guardian expressed understanding and agreed to proceed.     Lynnell Chad, LCSW 02/22/2023

## 2023-02-28 ENCOUNTER — Telehealth: Payer: Self-pay | Admitting: *Deleted

## 2023-02-28 ENCOUNTER — Ambulatory Visit (HOSPITAL_COMMUNITY): Payer: 59 | Admitting: Clinical

## 2023-02-28 ENCOUNTER — Telehealth: Payer: Self-pay | Admitting: Student
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
## 2023-02-28 NOTE — Telephone Encounter (Signed)
   Pre-operative Risk Assessment    Patient Name: Richard [name]  DOB: 1961/02/12 MRN: [id number]      Request for Surgical Clearance    Procedure:   Right Total Knee Arthroplasty.  Date of Surgery:  Clearance TBD                                 Surgeon:  Dr. Weber Cooks Surgeon's Group or Practice Name:  [facility] Phone number:  [phone] Fax number:  [phone]   Type of Clearance Requested:   - Medical  - Pharmacy:  Hold Aspirin and Clopidogrel (Plavix) Not Indicated   Type of Anesthesia:  Spinal   Additional requests/questions:    Signed, Emmit Pomfret   02/28/2023, 10:30 AM

## 2023-02-28 NOTE — Telephone Encounter (Signed)
   Name: Richard [name]  DOB: 1961-03-28  MRN: [id number]  Primary Cardiologist: Lance Muss, MD   Preoperative team, please contact this patient and set up a phone call appointment for further preoperative risk assessment. Please obtain consent and complete medication review. Thank you for your help.  I confirm that guidance regarding antiplatelet and oral anticoagulation therapy has been completed and, if necessary, noted below.  Regarding ASA therapy, it may be stopped 5-7 days prior to surgery with a plan to resume it as soon as felt to be feasible from a surgical standpoint in the post-operative period.   Patient is on Plavix for peripheral artery disease and guidance will have to come from prescribing provider.    Napoleon Form, Leodis Rains, NP 02/28/2023, 10:47 AM [facility]

## 2023-02-28 NOTE — Telephone Encounter (Signed)
Pt has been scheduled for tele pre op appt 03/08/23 @ 10:40. Med rec and consent are done.     Patient Consent for Virtual Visit        Richard [name] has provided verbal consent on 02/28/2023 for a virtual visit (video or telephone).   CONSENT FOR VIRTUAL VISIT FOR:  Richard [name]  By participating in this virtual visit I agree to the following:  I hereby voluntarily request, consent and authorize [facility] and its employed or contracted physicians, physician assistants, nurse practitioners or other licensed health care professionals (the Practitioner), to provide me with telemedicine health care services (the "Services") as deemed necessary by the treating Practitioner. I acknowledge and consent to receive the Services by the Practitioner via telemedicine. I understand that the telemedicine visit will involve communicating with the Practitioner through live audiovisual communication technology and the disclosure of certain medical information by electronic transmission. I acknowledge that I have been given the opportunity to request an in-person assessment or other available alternative prior to the telemedicine visit and am voluntarily participating in the telemedicine visit.  I understand that I have the right to withhold or withdraw my consent to the use of telemedicine in the course of my care at any time, without affecting my right to future care or treatment, and that the Practitioner or I may terminate the telemedicine visit at any time. I understand that I have the right to inspect all information obtained and/or recorded in the course of the telemedicine visit and may receive copies of available information for a reasonable fee.  I understand that some of the potential risks of receiving the Services via telemedicine include:  Delay or interruption in medical evaluation due to technological equipment failure or disruption; Information transmitted may not be sufficient (e.g.  poor resolution of images) to allow for appropriate medical decision making by the Practitioner; and/or  In rare instances, security protocols could fail, causing a breach of personal health information.  Furthermore, I acknowledge that it is my responsibility to provide information about my medical history, conditions and care that is complete and accurate to the best of my ability. I acknowledge that Practitioner's advice, recommendations, and/or decision may be based on factors not within their control, such as incomplete or inaccurate data provided by me or distortions of diagnostic images or specimens that may result from electronic transmissions. I understand that the practice of medicine is not an exact science and that Practitioner makes no warranties or guarantees regarding treatment outcomes. I acknowledge that a copy of this consent can be made available to me via my patient portal [facility] MyChart), or I can request a printed copy by calling the office of [facility].    I understand that my insurance will be billed for this visit.   I have read or had this consent read to me. I understand the contents of this consent, which adequately explains the benefits and risks of the Services being provided via telemedicine.  I have been provided ample opportunity to ask questions regarding this consent and the Services and have had my questions answered to my satisfaction. I give my informed consent for the services to be provided through the use of telemedicine in my medical care

## 2023-02-28 NOTE — Telephone Encounter (Signed)
Pt has been scheduled for tele pre op appt 03/08/23 @ 10:40. Med rec and consent are done.

## 2023-02-28 NOTE — Telephone Encounter (Signed)
Sounds good, will complete pre op risk assessment at that visit

## 2023-02-28 NOTE — Telephone Encounter (Signed)
Fax received from Dr. Eulah Pont with [facility] to perform a Rt Total Knee Arthroplasty on patient.  Patient needs surgery clearance. Surgery is Pending. Patient was seen on 12/05/22. Office protocol is a risk assessment can be sent to surgeon if patient has been seen in 60 days or less.   Sending to Dr. Thora Lance for risk assessment or recommendations if patient needs to be seen in office prior to surgical procedure.    Dr. Thora Lance I have patient scheduled for OV on 03/01/23

## 2023-03-01 ENCOUNTER — Encounter: Payer: Self-pay | Admitting: Student

## 2023-03-01 ENCOUNTER — Ambulatory Visit (INDEPENDENT_AMBULATORY_CARE_PROVIDER_SITE_OTHER): Payer: 59 | Admitting: Student

## 2023-03-01 VITALS — BP 124/72 | HR 85 | Temp 98.3°F | Ht 75.0 in | Wt 220.4 lb

## 2023-03-01 DIAGNOSIS — J4489 Other specified chronic obstructive pulmonary disease: Secondary | ICD-10-CM | POA: Diagnosis not present

## 2023-03-01 DIAGNOSIS — J4550 Severe persistent asthma, uncomplicated: Secondary | ICD-10-CM

## 2023-03-01 DIAGNOSIS — J8283 Eosinophilic asthma: Secondary | ICD-10-CM

## 2023-03-01 NOTE — Patient Instructions (Addendum)
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 Francine Graven in 3 months

## 2023-03-01 NOTE — Telephone Encounter (Signed)
OV notes and clearance form have been faxed back to [facility]. Nothing further needed at this time.  

## 2023-03-01 NOTE — Progress Notes (Signed)
For Mr. Dobish, risk of perioperative pulmonary complications is increased by:  [ ] Age greater than 65 years  [X]  COPD  [ ]  Serum albumin <3.5  [ ]  Smoking  [ ]  Obstructive sleep apnea  [ ]  NYHA Class II Pulmonary Hypertension  Respiratory complications generally occur in 1% of ASA Class I patients, 5% of ASA Class II and 10% of ASA Class III-IV patients These complications rarely result in mortality and include postoperative pneumonia, atelectasis, pulmonary embolism, ARDS and increased time requiring postoperative mechanical ventilation. His ARISCAT score is 19 indicating low risk of pulmonary complications for low-moderate risk procedure.     Overall, I recommend proceeding with the surgery if the risk for respiratory complications are outweighed by the potential benefits. This will need to be discussed between the patient and surgeon.  To reduce risks of respiratory complications, I recommend: --Pre- and post-operative incentive spirometry performed frequently while awake --Avoiding use of pancuronium during anesthesia. --Encourage mobility and incentive spirometry post-op --Early use of postoperative DVT chemoppx per protocol when appropriate from surgical site healing standpoint  --Cardiology has provided recommendations regarding perioperative ASA, plavix management  For any questions or concerns, please contact our office.

## 2023-03-08 ENCOUNTER — Ambulatory Visit (INDEPENDENT_AMBULATORY_CARE_PROVIDER_SITE_OTHER): Payer: 59 | Admitting: Clinical

## 2023-03-08 ENCOUNTER — Ambulatory Visit: Payer: 59 | Attending: Cardiology

## 2023-03-08 ENCOUNTER — Encounter (HOSPITAL_COMMUNITY): Payer: Self-pay | Admitting: Clinical

## 2023-03-08 DIAGNOSIS — Z01810 Encounter for preprocedural cardiovascular examination: Secondary | ICD-10-CM

## 2023-03-08 DIAGNOSIS — F411 Generalized anxiety disorder: Secondary | ICD-10-CM

## 2023-03-08 DIAGNOSIS — F1421 Cocaine dependence, in remission: Secondary | ICD-10-CM | POA: Diagnosis not present

## 2023-03-08 DIAGNOSIS — F332 Major depressive disorder, recurrent severe without psychotic features: Secondary | ICD-10-CM
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
## 2023-03-08 NOTE — Progress Notes (Signed)
THERAPIST PROGRESS NOTE  Session Time: 8:03-9:03am  Session #8  Participation Level: Active  Behavioral Response: Casual and Neat Alert Anxious Depressed and Tearful  Type of Therapy: Individual Therapy  Treatment Goals addressed:  LTG: score less than 5 on the Generalized Anxiety Disorder 7 Scale (GAD-7)  STG: practice problem solving skills 3 times per week for the next 4 weeks  LTG: Reduce frequency, intensity, and duration of depression symptoms so that daily functioning is improved  STG: identify cognitive patterns and beliefs that support depression  LTG: continue to maintain his over 30-year sobriety from cocaine and alcohol   ProgressTowards Goals: Progressing  Interventions: CBT and Supportive  Summary: Richard [name] is a 62 y.o. male who presents with depression and anxiety as well as a crack cocaine addiction in remission for over 30 years.  Patient stated he is getting stressed by all the doctors he has to see in order to get surgery on his knee.  Because of his multiple medical issues, all his doctors have to sign off on him getting the surgery done.  He has had a lot of doctor visits, phone interviews, and such for this purpose.  He stated he is in more pain recently and is eager to just get the surgery done.  People in his life keep telling him to have patience and he is becoming more angry by this each time he hears it because he feels that they would not be patient if they were in the same amount of pain.  He stated that he is returning to "a dark place" and becoming more isolated again, spending less time outside, in order to avoid taking his anger out anyone else.  He is spending much of the day on his couch, refuses to open his blinds, and does not want people to know he is home.  He feels that when people try to give him advice, they are trying to be his doctor and he already does that enough himself, does not think it is right.  He then said perseverated for some  time on the idea that the doctors "are giving me drugs to get rid of drugs."  CSW listened and reflected what he was saying back so that he could hear it himself.  He ultimately stated "I need to get back in the rooms because my thinking is diseased and it is constant."  He added that he thinks his disease/cocaine addiction is trying to "team up" with his anxiety.  We talked about the Thought Stopping technique briefly but he did not want to pursue that at this time.  Patient reverted back to the earlier topic and said his sleep has been disrupted even more lately.  He dreams a lot and wakes up angry, talks in his sleep in an angry manner according to family.  He wonders if the medicine could be affecting that, but eventually said that he thinks he is trying to find something wrong with the medication in order to have an excuse not to take it.  CSW compared this to other types of medicine.  He responded, "So you're saying I need to take the medicine," to which CSW responded, "Yes."  He is introspective and analyzes himself, does not give himself much grace.  This was pointed out to him and CSW encouraged him to be kinder to himself.  CSW encouraged him to return to Narcotics Anonymous meetings.  Suicidal/Homicidal: No   Therapist Response: Patient is making progress AEB continuing to  think about various issues in his life and bring them to session to discuss.  He concluded today that he does need his psychiatric medication and that he needs to return to NA.  He also accepted CSW's suggestion that he engage in  behavioral activation by opening the blinds, sitting outside, and getting off the couch. Throughout the session, patient explored thoughts and feelings associated with current life situations and external stressors.   He stated that he knows he is getting better because normally ne would cry when discussing the things discussed today, but he did not.  CSW encouraged patient's expression of feelings and  validated patient's thoughts, using empathy, active listening, open body language, and unconditional positive regard.  Patient demonstrated an orientation to time, place, person and situation.     Recommendations:  Return to therapy in 2 weeks, engage in self care behaviors, return to NA meetings  Plan: Return again in 2 weeks. Next appointment:  6/17  Diagnosis:  Encounter Diagnoses  Name Primary?   Severe episode of recurrent major depressive disorder, without psychotic features (HCC) Yes   Generalized anxiety disorder    Cocaine use disorder, severe, in sustained remission (HCC)     Collaboration of Care: Primary Care Provider AEB - can read therapy notes in Epic, Psychiatrist AEB - can read therapy notes in Epic, and Other provider involved in patient's care AEB - can read therapy notes in Epic  Patient/Guardian was advised Release of Information must be obtained prior to any record release in order to collaborate their care with an outside provider. Patient/Guardian was advised if they have not already done so to contact the registration department to sign all necessary forms in order for [place] to release information regarding their care.   Consent: Patient/Guardian gives verbal consent for treatment and assignment of benefits for services provided during this visit. Patient/Guardian expressed understanding and agreed to proceed.     Lynnell Chad, LCSW 03/08/2023

## 2023-03-11 ENCOUNTER — Ambulatory Visit (HOSPITAL_COMMUNITY): Payer: 59 | Admitting: Psychiatry

## 2023-03-12 ENCOUNTER — Other Ambulatory Visit (HOSPITAL_COMMUNITY): Payer: Self-pay

## 2023-03-13 NOTE — Progress Notes (Signed)
Fax received from [facility] on 02/28/23 for medical clearance/medication hold for R total knee arthroplasty to be signed by T. Lenell Antu, MD.  Provider signed on 03/05/23, form faxed back to sender on 03/05/23, verified successful, sent to scan center.

## 2023-03-14 NOTE — Progress Notes (Signed)
Surgery orders requested via Epic inbox. °

## 2023-03-15 ENCOUNTER — Other Ambulatory Visit (HOSPITAL_COMMUNITY): Payer: Self-pay

## 2023-03-18 ENCOUNTER — Other Ambulatory Visit: Payer: Self-pay

## 2023-03-18 NOTE — Patient Instructions (Addendum)
SURGICAL WAITING ROOM VISITATION Patients having surgery or a procedure may have no more than 2 support people in the waiting area - these visitors may rotate.    Children under the age of 56 must have an adult with them who is not the patient.  If the patient needs to stay at the hospital during part of their recovery, the visitor guidelines for inpatient rooms apply. Pre-op nurse will coordinate an appropriate time for 1 support person to accompany patient in pre-op.  This support person may not rotate.    Please refer to the [facility] website for the visitor guidelines for Inpatients (after your surgery is over and you are in a regular room).       Your procedure is scheduled on: 04-03-23   Report to [facility] Main Entrance    Report to admitting at 8:00 AM   Call this number if you have problems the morning of surgery [phone]   Do not eat food :After Midnight.   After Midnight you may have the following liquids until 7:20 AM/ DAY OF SURGERY  Water Non-Citrus Juices (without pulp, NO RED-Apple, White grape, White cranberry) Black Coffee (NO MILK/CREAM OR CREAMERS, sugar ok)  Clear Tea (NO MILK/CREAM OR CREAMERS, sugar ok) regular and decaf                             Plain Jell-O (NO RED)                                           Fruit ices (not with fruit pulp, NO RED)                                     Popsicles (NO RED)                                                               Sports drinks like Gatorade (NO RED)                   The day of surgery:  Drink ONE (1) Pre-Surgery G2 at 7:20 AM the morning of surgery. Drink in one sitting. Do not sip.  This drink was given to you during your hospital  pre-op appointment visit. Nothing else to drink after completing the Pre-Surgery G2.          If you have questions, please contact your surgeon's office.   FOLLOW  ANY ADDITIONAL PRE OP INSTRUCTIONS YOU RECEIVED FROM YOUR SURGEON'S OFFICE!!!      Oral Hygiene is also important to reduce your risk of infection.                                    Remember - BRUSH YOUR TEETH THE MORNING OF SURGERY WITH YOUR REGULAR TOOTHPASTE   Do NOT smoke after Midnight   Take these medicines the morning of surgery with A SIP OF WATER:   Atorvastatin  Hydroxyzine  Pregabalin  Okay  to use inhalers (bring with you day of surgery)  Okay to use nasal spray  How to Manage Your Diabetes Before and After Surgery  Why is it important to control my blood sugar before and after surgery? Improving blood sugar levels before and after surgery helps healing and can limit problems. A way of improving blood sugar control is eating a healthy diet by:  Eating less sugar and carbohydrates  Increasing activity/exercise  Talking with your doctor about reaching your blood sugar goals High blood sugars (greater than 180 mg/dL) can raise your risk of infections and slow your recovery, so you will need to focus on controlling your diabetes during the weeks before surgery. Make sure that the doctor who takes care of your diabetes knows about your planned surgery including the date and location.  How do I manage my blood sugar before surgery? Check your blood sugar at least 4 times a day, starting 2 days before surgery, to make sure that the level is not too high or low. Check your blood sugar the morning of your surgery when you wake up and every 2 hours until you get to the Short Stay unit. If your blood sugar is less than 70 mg/dL, you will need to treat for low blood sugar: Do not take insulin. Treat a low blood sugar (less than 70 mg/dL) with  cup of clear juice (cranberry or apple), 4 glucose tablets, OR glucose gel. Recheck blood sugar in 15 minutes after treatment (to make sure it is greater than 70 mg/dL). If your blood sugar is not greater than 70 mg/dL on recheck, call [phone] for further instructions. Report your blood sugar to the short stay nurse  when you get to Short Stay.  If you are admitted to the hospital after surgery: Your blood sugar will be checked by the staff and you will probably be given insulin after surgery (instead of oral diabetes medicines) to make sure you have good blood sugar levels. The goal for blood sugar control after surgery is 80-180 mg/dL.   WHAT DO I DO ABOUT MY DIABETES MEDICATION?  Do not take oral diabetes medicines (pills) the morning of surgery.  Hold Trulicity 7 days before surgery (do not take after 03-26-23)  DO NOT TAKE THE FOLLOWING 7 DAYS PRIOR TO SURGERY: Ozempic, Wegovy, Rybelsus (Semaglutide), Byetta (exenatide), Bydureon (exenatide ER), Victoza, Saxenda (liraglutide), or Trulicity (dulaglutide) Mounjaro (Tirzepatide) Adlyxin (Lixisenatide), Polyethylene Glycol Loxenatide.  If your CBG is greater than 220 mg/dL, you may take  of your sliding scale  (correction) dose of insulin.   Reviewed and Endorsed by [facility] Patient Education Committee, August 2015  Bring CPAP mask and tubing day of surgery.                              You may not have any metal on your body including, jewelry, and body piercing             Do not wear  lotions, powders, /cologne, or deodorant              Men may shave face and neck.   Do not bring valuables to the hospital. [facility] IS NOT RESPONSIBLE   FOR VALUABLES.   Contacts, dentures or bridgework may not be worn into surgery.   Bring small overnight bag day of surgery.   DO NOT BRING YOUR HOME MEDICATIONS TO THE HOSPITAL. PHARMACY WILL DISPENSE MEDICATIONS LISTED ON  YOUR MEDICATION LIST TO YOU DURING YOUR ADMISSION IN THE HOSPITAL!     Special Instructions: Bring a copy of your healthcare power of attorney and living will documents the day of surgery if you haven't scanned them before.              Please read over the following fact sheets you were given: IF YOU HAVE QUESTIONS ABOUT YOUR PRE-OP INSTRUCTIONS PLEASE CALL [phone]  Gwen  If you received a COVID test during your pre-op visit  it is requested that you wear a mask when out in public, stay away from anyone that may not be feeling well and notify your surgeon if you develop symptoms. If you test positive for Covid or have been in contact with anyone that has tested positive in the last 10 days please notify you surgeon.    Pre-operative 5 CHG Bath Instructions   You can play a key role in reducing the risk of infection after surgery. Your skin needs to be as free of germs as possible. You can reduce the number of germs on your skin by washing with CHG (chlorhexidine gluconate) soap before surgery. CHG is an antiseptic soap that kills germs and continues to kill germs even after washing.   DO NOT use if you have an allergy to chlorhexidine/CHG or antibacterial soaps. If your skin becomes reddened or irritated, stop using the CHG and notify one of our RNs at  [phone] .   Please shower with the CHG soap starting 4 days before surgery using the following schedule:     Please keep in mind the following:  DO NOT shave, including legs and underarms, starting the day of your first shower.   You may shave your face at any point before/day of surgery.  Place clean sheets on your bed the day you start using CHG soap. Use a clean washcloth (not used since being washed) for each shower. DO NOT sleep with pets once you start using the CHG.   CHG Shower Instructions:  If you choose to wash your hair and private area, wash first with your normal shampoo/soap.  After you use shampoo/soap, rinse your hair and body thoroughly to remove shampoo/soap residue.  Turn the water OFF and apply about 3 tablespoons (45 ml) of CHG soap to a CLEAN washcloth.  Apply CHG soap ONLY FROM YOUR NECK DOWN TO YOUR TOES (washing for 3-5 minutes)  DO NOT use CHG soap on face, private areas, open wounds, or sores.  Pay special attention to the area where your surgery is being performed.   If you are having back surgery, having someone wash your back for you may be helpful. Wait 2 minutes after CHG soap is applied, then you may rinse off the CHG soap.  Pat dry with a clean towel  Put on clean clothes/pajamas   If you choose to wear lotion, please use ONLY the CHG-compatible lotions on the back of this paper.     Additional instructions for the day of surgery: DO NOT APPLY any lotions, deodorants, cologne, or perfumes.   Put on clean/comfortable clothes.  Brush your teeth.  Ask your nurse before applying any prescription medications to the skin.      CHG Compatible Lotions   Aveeno Moisturizing lotion  Cetaphil Moisturizing Cream  Cetaphil Moisturizing Lotion  Clairol Herbal Essence Moisturizing Lotion, Dry Skin  Clairol Herbal Essence Moisturizing Lotion, Extra Dry Skin  Clairol Herbal Essence Moisturizing Lotion, Normal Skin  Curel Age Defying Therapeutic  Moisturizing Lotion with Alpha Hydroxy  Curel Extreme Care Body Lotion  Curel Soothing Hands Moisturizing Hand Lotion  Curel Therapeutic Moisturizing Cream, Fragrance-Free  Curel Therapeutic Moisturizing Lotion, Fragrance-Free  Curel Therapeutic Moisturizing Lotion, Original Formula  Eucerin Daily Replenishing Lotion  Eucerin Dry Skin Therapy Plus Alpha Hydroxy Crme  Eucerin Dry Skin Therapy Plus Alpha Hydroxy Lotion  Eucerin Original Crme  Eucerin Original Lotion  Eucerin Plus Crme Eucerin Plus Lotion  Eucerin TriLipid Replenishing Lotion  Keri Anti-Bacterial Hand Lotion  Keri Deep Conditioning Original Lotion Dry Skin Formula Softly Scented  Keri Deep Conditioning Original Lotion, Fragrance Free Sensitive Skin Formula  Keri Lotion Fast Absorbing Fragrance Free Sensitive Skin Formula  Keri Lotion Fast Absorbing Softly Scented Dry Skin Formula  Keri Original Lotion  Keri Skin Renewal Lotion Keri Silky Smooth Lotion  Keri Silky Smooth Sensitive Skin Lotion  Nivea Body Creamy Conditioning Oil  Nivea  Body Extra Enriched Teacher, adult education Moisturizing Lotion Nivea Crme  Nivea Skin Firming Lotion  NutraDerm 30 Skin Lotion  NutraDerm Skin Lotion  NutraDerm Therapeutic Skin Cream  NutraDerm Therapeutic Skin Lotion  ProShield Protective Hand Cream  Provon moisturizing lotion   PATIENT SIGNATURE_________________________________  NURSE SIGNATURE__________________________________  ________________________________________________________________________

## 2023-03-18 NOTE — Progress Notes (Signed)
COVID Vaccine Completed:  Yes  Date of COVID positive in last 90 days:  PCP - Carlynn Purl, DO Cardiologist - Lance Muss, MD Pulmonologist - Felisa Bonier, MD Vascular - Graceann Congress, PA-C  Pulmonary clearance in Epic dated 03-01-23 by Dr. Thora Lance  Cardiac clearance in Epic dated 03-08-23 by Edd Fabian, NP  Chest x-ray - 02-10-23 Epic EKG - 02-10-23 Epic Stress Test - 10-06-21 Epic ECHO - 10-06-21 Epic Cardiac Cath - 09-26-15 Epic Pacemaker/ICD device last checked: Spinal Cord Stimulator:  Bowel Prep -   Sleep Study -  CPAP -   Fasting Blood Sugar -  Checks Blood Sugar _____ times a day  Last dose of GLP1 agonist-  N/A GLP1 instructions:  N/A   Last dose of SGLT-2 inhibitors-  N/A SGLT-2 instructions: N/A   Blood Thinner Instructions:  Plavix prescribed by vascular  Aspirin Instructions:  ASA 81 okay to stop 5-7 days before surgery Last Dose:  Activity level:  Can go up a flight of stairs and perform activities of daily living without stopping and without symptoms of chest pain or shortness of breath.  Able to exercise without symptoms  Unable to go up a flight of stairs without symptoms of     Anesthesia review:  CAD, PAD, mild aortic stenosis, COPD, dilation ascending aorta  Patient denies shortness of breath, fever, cough and chest pain at PAT appointment  Patient verbalized understanding of instructions that were given to them at the PAT appointment. Patient was also instructed that they will need to review over the PAT instructions again at home before surgery.

## 2023-03-18 NOTE — Telephone Encounter (Signed)
ERROR

## 2023-03-19 ENCOUNTER — Ambulatory Visit: Payer: Self-pay | Admitting: Emergency Medicine

## 2023-03-19 DIAGNOSIS — G8929 Other chronic pain: Secondary | ICD-10-CM
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
## 2023-03-21 ENCOUNTER — Encounter (HOSPITAL_COMMUNITY)
Admission: RE | Admit: 2023-03-21 | Discharge: 2023-03-21 | Disposition: A | Discharge status: Home / Self Care | Payer: 59 | Source: Ambulatory Visit | Attending: Orthopedic Surgery | Admitting: Orthopedic Surgery

## 2023-03-21 ENCOUNTER — Ambulatory Visit (INDEPENDENT_AMBULATORY_CARE_PROVIDER_SITE_OTHER): Payer: 59

## 2023-03-21 ENCOUNTER — Other Ambulatory Visit: Payer: Self-pay

## 2023-03-21 ENCOUNTER — Telehealth: Payer: Self-pay | Admitting: Pulmonary Disease

## 2023-03-21 ENCOUNTER — Encounter (HOSPITAL_COMMUNITY): Payer: Self-pay | Admitting: Physician Assistant

## 2023-03-21 ENCOUNTER — Encounter (HOSPITAL_COMMUNITY): Payer: Self-pay

## 2023-03-21 VITALS — BP 134/61 | HR 95 | Temp 97.8°F | Resp 18 | Ht 75.0 in | Wt 220.2 lb

## 2023-03-21 VITALS — BP 123/72 | HR 101 | Temp 98.2°F | Ht 75.0 in | Wt 223.4 lb

## 2023-03-21 DIAGNOSIS — J441 Chronic obstructive pulmonary disease with (acute) exacerbation: Secondary | ICD-10-CM

## 2023-03-21 DIAGNOSIS — Z87891 Personal history of nicotine dependence: Secondary | ICD-10-CM | POA: Diagnosis not present

## 2023-03-21 DIAGNOSIS — G8929 Other chronic pain: Secondary | ICD-10-CM | POA: Insufficient documentation

## 2023-03-21 DIAGNOSIS — D539 Nutritional anemia, unspecified: Secondary | ICD-10-CM | POA: Diagnosis not present

## 2023-03-21 DIAGNOSIS — Z01818 Encounter for other preprocedural examination: Secondary | ICD-10-CM | POA: Diagnosis present

## 2023-03-21 DIAGNOSIS — I10 Essential (primary) hypertension: Secondary | ICD-10-CM | POA: Diagnosis not present

## 2023-03-21 DIAGNOSIS — E1151 Type 2 diabetes mellitus with diabetic peripheral angiopathy without gangrene: Secondary | ICD-10-CM | POA: Diagnosis not present

## 2023-03-21 DIAGNOSIS — M25561 Pain in right knee: Secondary | ICD-10-CM | POA: Insufficient documentation

## 2023-03-21 DIAGNOSIS — E119 Type 2 diabetes mellitus without complications: Secondary | ICD-10-CM | POA: Diagnosis not present

## 2023-03-21 DIAGNOSIS — D509 Iron deficiency anemia, unspecified: Secondary | ICD-10-CM | POA: Insufficient documentation

## 2023-03-21 HISTORY — DX: Atherosclerotic heart disease of native coronary artery without angina pectoris: I25.10

## 2023-03-21 HISTORY — DX: Dyspnea, unspecified: R06.00

## 2023-03-21 HISTORY — DX: Unspecified osteoarthritis, unspecified site: M19.90

## 2023-03-21 HISTORY — DX: Peripheral vascular disease, unspecified: I73.9

## 2023-03-21 LAB — COMPREHENSIVE METABOLIC PANEL
ALT: 17 U/L (ref 0–44)
AST: 19 U/L (ref 15–41)
Albumin: 3.6 g/dL (ref 3.5–5.0)
Alkaline Phosphatase: 67 U/L (ref 38–126)
Anion gap: 8 (ref 5–15)
BUN: 22 mg/dL (ref 8–23)
CO2: 23 mmol/L (ref 22–32)
Calcium: 8.8 mg/dL — ABNORMAL LOW (ref 8.9–10.3)
Chloride: 104 mmol/L (ref 98–111)
Creatinine, Ser: 1.24 mg/dL (ref 0.61–1.24)
GFR, Estimated: >60 mL/min (ref >60)
Glucose, Bld: 146 mg/dL — ABNORMAL HIGH (ref 70–99)
Potassium: 4.8 mmol/L (ref 3.5–5.1)
Sodium: 135 mmol/L (ref 135–145)
Total Bilirubin: 0.5 mg/dL (ref 0.3–1.2)
Total Protein: 7.5 g/dL (ref 6.5–8.1)

## 2023-03-21 LAB — SURGICAL PCR SCREEN
MRSA, PCR: NEGATIVE
Staphylococcus aureus: NEGATIVE

## 2023-03-21 LAB — CBC WITH DIFFERENTIAL/PLATELET
Abs Immature Granulocytes: 0.03 10*3/uL (ref 0.00–0.07)
Basophils Absolute: 0 10*3/uL (ref 0.0–0.1)
Basophils Relative: 1 %
Eosinophils Absolute: 0.1 10*3/uL (ref 0.0–0.5)
Eosinophils Relative: 2 %
HCT: 33.1 % — ABNORMAL LOW (ref 39.0–52.0)
Hemoglobin: 9.7 g/dL — ABNORMAL LOW (ref 13.0–17.0)
Immature Granulocytes: 1 %
Lymphocytes Relative: 21 %
Lymphs Abs: 0.9 10*3/uL (ref 0.7–4.0)
MCH: 21 pg — ABNORMAL LOW (ref 26.0–34.0)
MCHC: 29.3 g/dL — ABNORMAL LOW (ref 30.0–36.0)
MCV: 71.6 fL — ABNORMAL LOW (ref 80.0–100.0)
Monocytes Absolute: 0.5 10*3/uL (ref 0.1–1.0)
Monocytes Relative: 13 %
Neutro Abs: 2.6 10*3/uL (ref 1.7–7.7)
Neutrophils Relative %: 62 %
Platelets: 344 10*3/uL (ref 150–400)
RBC: 4.62 MIL/uL (ref 4.22–5.81)
RDW: 19.5 % — ABNORMAL HIGH (ref 11.5–15.5)
WBC: 4.3 10*3/uL (ref 4.0–10.5)
nRBC: 0 % (ref 0.0–0.2)

## 2023-03-21 LAB — HEMOGLOBIN A1C
Hgb A1c MFr Bld: 7.9 % — ABNORMAL HIGH (ref 4.8–5.6)
Mean Plasma Glucose: 180.03 mg/dL

## 2023-03-21 LAB — TYPE AND SCREEN
ABO/RH(D): A POS
Antibody Screen: NEGATIVE

## 2023-03-21 LAB — GLUCOSE, CAPILLARY: Glucose-Capillary: 132 mg/dL — ABNORMAL HIGH (ref 70–99)

## 2023-03-21 MED ORDER — TRULICITY 4.5 MG/0.5ML ~~LOC~~ SOAJ
4.5000 mg | SUBCUTANEOUS | 3 refills | Status: DC
Start: 2023-03-21 — End: 2023-06-17

## 2023-03-21 NOTE — Assessment & Plan Note (Addendum)
Patient presents with history of anemia, though recently became microcytic.  Labs today with hemoglobin 9.7 and MCV 71.6.  He does have a history of tubular adenoma of the colon removed during colonoscopy 7 years ago but he would not be due for repeat at this time given recommendations at the time of the colonoscopy.  He denies any symptoms of overt blood loss including hematochezia, melena, hematemesis, hematuria, or epistaxis.  He did have a right lower extremity vascular procedure several months ago which could have worsened his pre-existing anemia.  No pain or hematoma at the site of access.  Abdomen normal on exam today.  No conjunctival or mucosal pallor.  Vitals stable.  He denies any changes in bowel habits, worsening reflux or epigastric pain, fatigue, lightheadedness, or pica.  Denies any family history of GI malignancies.  I suspect his newfound microcytic anemia is due to iron deficiency possibly 2/2 occult GI losses. -Iron studies, iron infusion versus oral iron supplementation pending results -Needs additional evaluation, possibly with accelerated scheduling of repeat colonoscopy

## 2023-03-21 NOTE — Telephone Encounter (Signed)
Since patient has been scheduled. Closing encounter
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
## 2023-03-21 NOTE — Assessment & Plan Note (Signed)
Patient presents with a history of obstructive lung disease, mixed picture.  He does have some mild inspiratory wheezes in the lower lung fields today on exam but is oxygenating well on room air.  Denies any shortness of breath, changes in sputum production or cough.  I do not feel that he has an exacerbation at this time.  He reports taking his Breztri, albuterol, and Singulair today. -Follow-up with pulmonology tomorrow -Continue current regimen

## 2023-03-21 NOTE — Telephone Encounter (Signed)
Patient would like to be fit in for a surgical clearance appt. Asap because his surgery is scheduled for 6/26 and his doctor suggested that he have his lungs checked because he had some wheezing before his surgery.  Patient stated he is in a lot of pain and does not want his surgery postponed.  Please advise and call patient to see if he can be worked in with a NP or doctor.  CB# [phone]

## 2023-03-21 NOTE — Patient Instructions (Signed)
Mr.Danton Hall, it was a pleasure seeing you today!  Today we discussed: Diabetes - increase trulicity to 4.5 weekly Anemia - will call back with lab results and treat accordingly  I have ordered the following labs today:   Lab Orders         Iron, TIBC and Ferritin Panel      Tests ordered today:  none  Referrals ordered today:   Referral Orders  No referral(s) requested today     I have ordered the following medication/changed the following medications:   Stop the following medications: Medications Discontinued During This Encounter  Medication Reason   Dulaglutide (TRULICITY) 3 MG/0.5ML SOPN Dose change     Start the following medications: Meds ordered this encounter  Medications   Dulaglutide (TRULICITY) 4.5 MG/0.5ML SOPN    Sig: Inject 4.5 mg as directed once a week.    Dispense:  0.5 mL    Refill:  3     Follow-up:  2-4 weeks    Please make sure to arrive 15 minutes prior to your next appointment. If you arrive late, you may be asked to reschedule.   We look forward to seeing you next time. Please call our clinic at [phone] if you have any questions or concerns. The best time to call is Monday-Friday from 9am-4pm, but there is someone available 24/7. If after hours or the weekend, call the main hospital number and ask for the Internal Medicine Resident On-Call. If you need medication refills, please notify your pharmacy one week in advance and they will send [place] a request.  Thank you for letting us take part in your care. Wishing you the best!  Thank you, Adron Bene, MD

## 2023-03-21 NOTE — Telephone Encounter (Signed)
PT states at the [facility] and they told him he needs to be seen by Pulm and heart Dr. Lisette [facility] in person. I made appt.

## 2023-03-21 NOTE — Progress Notes (Signed)
CC: Referred for low hemoglobin  HPI:  [name] is a 62 y.o. male with past medical history as below who was referred after being found to have a low hemoglobin in preoperative evaluation.  Please see detailed assessment and plan for HPI.  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  Physical Exam:  Vitals:   03/21/23 1549  BP: 123/72  Pulse: (!) 101  Temp: 98.2 F (36.8 C)  TempSrc: Oral  SpO2: 97%  Weight: 223 lb 6.4 oz (101.3 kg)  Height: 6\' 3"  (1.905 m)   Physical Exam Constitutional:      General: He is not in acute distress.    Comments: Using cane. Right knee brace.  HENT:     Head: Normocephalic and atraumatic.  Eyes:     Extraocular Movements: Extraocular movements intact.  Cardiovascular:     Rate and Rhythm: Normal rate and regular rhythm.     Heart sounds: No murmur heard. Pulmonary:     Effort: Pulmonary effort is normal.     Breath sounds: Wheezing present. No rales.  Abdominal:     General: There is no distension.     Palpations:  Abdomen is soft.     Tenderness: There is no abdominal tenderness.  Musculoskeletal:     Cervical back: Neck supple.     Right lower leg: Edema present.     Left lower leg: No edema.  Skin:    General: Skin is warm and dry.     Coloration: Skin is not jaundiced.  Neurological:     Mental Status: He is alert and oriented to person, place, and time.  Psychiatric:        Mood and Affect: Mood normal.        Behavior: Behavior normal.      Assessment & Plan:   See Encounters Tab for problem based charting.  Type 2 diabetes mellitus with peripheral artery disease [facility]) Patient presents for follow-up of his type 2 diabetes.  A1c today slightly worsened to 7.9.  He currently takes Trulicity 3 mg weekly, Synjardy 12.02-999 mg twice daily.  He reports his blood sugars at home have been ranging from the 130s to 150s.  Denies any hypoglycemic episodes.  He has a short-term goal A1c of 7.5 in order to undergo knee replacement. -Increase Trulicity to 4.5 mg weekly  Acute exacerbation of COPD with  asthma [facility]) Patient presents with a history of obstructive lung disease, mixed picture.  He does have some mild inspiratory wheezes in the lower lung fields today on exam but is oxygenating well on room air.  Denies any shortness of breath, changes in sputum production or cough.  I do not feel that he has an exacerbation at this time.  He reports taking his Breztri, albuterol, and Singulair today. -Follow-up with pulmonology tomorrow -Continue current regimen  Microcytic anemia Patient presents with history of anemia, though recently became microcytic.  Labs today with hemoglobin 9.7 and MCV 71.6.  He does have a history of tubular adenoma of the colon removed during colonoscopy 7 years ago but he would not be due for repeat at this time given recommendations at the time of the colonoscopy.  He denies any symptoms of overt blood loss including hematochezia, melena, hematemesis, hematuria, or epistaxis.   He did have a right lower extremity vascular procedure several months ago which could have worsened his pre-existing anemia.  No pain or hematoma at the site of access.  Abdomen normal on exam today.  No conjunctival or mucosal pallor.  Vitals stable.  He denies any changes in bowel habits, worsening reflux or epigastric pain, fatigue, lightheadedness, or pica.  Denies any family history of GI malignancies.  I suspect his newfound microcytic anemia is due to iron deficiency possibly 2/2 occult GI losses. -Iron studies, iron infusion versus oral iron supplementation pending results -Needs additional evaluation, possibly with accelerated scheduling of repeat colonoscopy  Patient discussed with Dr. Antony Contras

## 2023-03-21 NOTE — Care Plan (Signed)
Patient came to PAT today and was noted to be wheezing, SOB had to use his inhaler. Seen by my colleague Shanda Bumps Ward PA-C and advised to f/u with Pulmonology due to potential asthma/COPD exacerbation. Had an ED visit on 5/5 for the same and saw his Pulmonologist on 5/24 and was cleared for surgery at that time.   Also noted on screening labs Hgb is 9.7 which has been trending down over the past several months and Hgb A1c is 7.9 so not completely optimized for surgery. Called with results and left message with Tresa Endo with Dr. Colvin Caroli office.

## 2023-03-21 NOTE — Assessment & Plan Note (Addendum)
Patient presents for follow-up of his type 2 diabetes.  A1c today slightly worsened to 7.9.  He currently takes Trulicity 3 mg weekly, Synjardy 12.02-999 mg twice daily.  He reports his blood sugars at home have been ranging from the 130s to 150s.  Denies any hypoglycemic episodes.  He has a short-term goal A1c of 7.5 in order to undergo knee replacement. -Increase Trulicity to 4.5 mg weekly

## 2023-03-22 ENCOUNTER — Ambulatory Visit (INDEPENDENT_AMBULATORY_CARE_PROVIDER_SITE_OTHER): Payer: 59 | Admitting: Student

## 2023-03-22 ENCOUNTER — Encounter: Payer: Self-pay | Admitting: Student

## 2023-03-22 ENCOUNTER — Other Ambulatory Visit: Payer: Self-pay

## 2023-03-22 ENCOUNTER — Telehealth: Payer: Self-pay | Admitting: *Deleted

## 2023-03-22 ENCOUNTER — Other Ambulatory Visit (HOSPITAL_COMMUNITY): Payer: Self-pay

## 2023-03-22 VITALS — BP 122/66 | HR 97 | Temp 99.0°F | Ht 75.0 in | Wt 221.6 lb

## 2023-03-22 DIAGNOSIS — J4489 Other specified chronic obstructive pulmonary disease: Secondary | ICD-10-CM | POA: Diagnosis not present

## 2023-03-22 DIAGNOSIS — J8283 Eosinophilic asthma: Secondary | ICD-10-CM

## 2023-03-22 LAB — IRON,TIBC AND FERRITIN PANEL
Ferritin: 6 ng/mL — ABNORMAL LOW (ref 30–400)
Iron Saturation: 4 % — CL (ref 15–55)
Iron: 18 ug/dL — ABNORMAL LOW (ref 38–169)
Total Iron Binding Capacity: 446 ug/dL (ref 250–450)
UIBC: 428 ug/dL — ABNORMAL HIGH (ref 111–343)

## 2023-03-22 MED ORDER — FLUTICASONE PROPIONATE 50 MCG/ACT NA SUSP
1.0000 | Freq: Every day | NASAL | 11 refills | Status: DC
  Filled 2023-03-22: qty 16, 30d supply, fill #0

## 2023-03-22 MED ORDER — NA FERRIC GLUC CPLX IN SUCROSE 12.5 MG/ML IV SOLN
500.0000 mg | Freq: Once | INTRAVENOUS | Status: DC
Start: 2023-03-22 — End: 2023-03-26
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
## 2023-03-22 NOTE — Patient Instructions (Signed)
-   Pre op clearance written again

## 2023-03-22 NOTE — Telephone Encounter (Signed)
Patient was called with Fe infusion date.  03/28/2023.  Arrive at 8:45 AM at [facility].  Has a 9 AM to 1 PM appointment.  Patient was informed ad voiced understanding of.

## 2023-03-22 NOTE — Addendum Note (Signed)
Addended by: Adron Bene on: 03/22/2023 09:48 AM   Modules accepted: Orders

## 2023-03-22 NOTE — Progress Notes (Signed)
For Richard [name], risk of perioperative pulmonary complications is increased by:  [ ]Age greater than 65 years  [X] COPD  [ ] Serum albumin <3.5  [ ] Smoking  [ ] Obstructive sleep apnea  [ ] NYHA Class II Pulmonary Hypertension  Respiratory complications generally occur in 1% of ASA Class I patients, 5% of ASA Class II and 10% of ASA Class III-IV patients These complications rarely result in mortality and include postoperative pneumonia, atelectasis, pulmonary embolism, ARDS and increased time requiring postoperative mechanical ventilation. His ARISCAT score is 19 indicating low risk of pulmonary complications for low-moderate risk procedure.     Overall, I recommend proceeding with the surgery if the risk for respiratory complications are outweighed by the potential benefits. This will need to be discussed between the patient and surgeon.  To reduce risks of respiratory complications, I recommend: --Pre- and post-operative incentive spirometry performed frequently while awake --Avoiding use of pancuronium during anesthesia. --Encourage mobility and incentive spirometry post-op --Early use of postoperative DVT chemoppx per protocol when appropriate from surgical site healing standpoint  --Cardiology has provided recommendations regarding perioperative ASA, plavix management  For any questions or concerns, please contact our office.  

## 2023-03-22 NOTE — Addendum Note (Signed)
Addended by: Adron Bene on: 03/22/2023 09:15 AM   Modules accepted: Orders

## 2023-03-25 ENCOUNTER — Encounter (HOSPITAL_COMMUNITY): Payer: Self-pay | Admitting: Clinical

## 2023-03-25 ENCOUNTER — Ambulatory Visit (INDEPENDENT_AMBULATORY_CARE_PROVIDER_SITE_OTHER): Payer: 59 | Admitting: Clinical

## 2023-03-25 DIAGNOSIS — F332 Major depressive disorder, recurrent severe without psychotic features: Secondary | ICD-10-CM | POA: Diagnosis not present

## 2023-03-25 DIAGNOSIS — F1421 Cocaine dependence, in remission: Secondary | ICD-10-CM

## 2023-03-25 DIAGNOSIS — F411 Generalized anxiety disorder: Secondary | ICD-10-CM | POA: Diagnosis not present

## 2023-03-25 NOTE — Progress Notes (Signed)
THERAPIST PROGRESS NOTE  Session Time: 8:08-9:00am  Session #9  Participation Level: Active  Behavioral Response: Casual and Neat Alert Anxious  Type of Therapy: Individual Therapy  Treatment Goals addressed:  LTG: score less than 5 on the Generalized Anxiety Disorder 7 Scale (GAD-7)  STG: practice problem solving skills 3 times per week for the next 4 weeks  LTG: Reduce frequency, intensity, and duration of depression symptoms so that daily functioning is improved  STG: identify cognitive patterns and beliefs that support depression  LTG: continue to maintain his over 30-year sobriety from cocaine and alcohol   ProgressTowards Goals: Progressing  Interventions: CBT and Supportive  Summary: Richard [name] is a 62 y.o. male who presents with depression and anxiety as well as a crack cocaine addiction in remission for over 30 years. He sadly and with some frustration reported that his knee replacement surgery has been postponed because he is anemic and has been heard wheezing which make him a less than ideal surgery candidate.  His hemoglobin had dropped and his A1C had increased.  He will have an iron transfusion soon and has change his diet to reflect necessary changes such as drinking more water and less juice, eating fewer simple carbs, and consuming more smoothies with iron-rich vegetables in them.  (He also was quite angry that his Nutrabullet had broken.  He also shared that the nurse who told him they were cancelling the surgery "really pissed me off.  They don't know how much pain I'm in.")  He stated that the pain in his leg, which was very swollen in this session in a manner that is apparently his new norm, makes it difficult for him to do much.  Therefore he is becoming bored with looking at the same 4 walls at home.  He did report going to church, having a great Father's Day with his daughter and granddaughter, going to one Narcotics Anonymous meeting, and going to the [facility] a  few times to get in the pool and hot tub for relief from pain.  He was commended for these many activities, reminded about Behavioral Activation as a helpful CBT technique.  CSW also recommended journaling but he stated he hates writing. CSW told him he could think about, if not write about, why the teen at NA asked him specifically those questions.  He is reading his Bible more, has a bible study at 7:30pm nightly with a friend, and feels his faith is strengthened recently.  He has been delaying going through school registration until he finds out about his surgery and whether he will be available.  CSW encouraged him to register now to ensure he can get in, then to cancel the registration before the first day of class if that is necessary for medical reason.  In NA, a teenager asked him how long he has been sober and whether he feels free.  Much of the remainder of the session was spent processing his responses to these questions.  He shared that he does not know how long he has been sober, only that it is more than 30 years, because the only day that matters is today.  He does not want the pressure of counting days or years.  He said that he is free but not free and explained each side.  He told the teenager that he is happier and better, though.  As we discussed the NA program and what he can get out of it, he referred to the "13 steps"  and when CSW asked about this, showed a lack of familiarity with all the steps at least in the immediate past.  He went on to say that for him the only step that really matters is the first one, because if he does not admit he is addicted, nothing else will matter.  We talked about the Serenity Prayer and how to pause with each section to think about what we need to accept and what we need to change.  He stated the same concepts apply to his knee.    Suicidal/Homicidal: No   Therapist Response: Patient is progressing AEB engaging in scheduled therapy session.  He presented  oriented x5 and stated he was feeling "unhappy that they cancelled the surgery, pissed off at the nurse for deciding that."  CSW worked with him to see that per the [employer], this decision was likely something that the nurse had to accept with serenity rather than figure out how to change with courage, because some things just make for a more risk surgery.  He eventually could accept this.  CSW evaluated patient's medication compliance and self-care since last session.  CSW reviewed the last session with patient who reported he has been doing quite a bit of behavioral activation and has been happier as a result.  Patient stated he is in pain and is scared he is going to have to continue to live this way long-term.  This was discussed and processed throughout the remainder of the session.  He stated "I'm not going to jump off a bridge" and denied SI, even passive.  CSW encouraged patient to schedule more therapy sessions for the future, as 6/21 is the last one scheduled.  Throughout the session, CSW gave patient the opportunity to explore thoughts and feelings associated with current life situations and past/present external stressors.   CSW encouraged patient's expression of feelings and validated patient's thoughts using empathy, active listening, open body language, and unconditional positive regard.       Recommendations:  Return to therapy in 1 week, engage in self care behaviors, return to NA meetings and continue behavioral activation  Plan: Return again in 2 weeks.  Next appointment: 7/1  Diagnosis:  Encounter Diagnoses  Name Primary?   Severe episode of recurrent major depressive disorder, without psychotic features (HCC)    Generalized anxiety disorder Yes   Cocaine use disorder, severe, in sustained remission (HCC)      Collaboration of Care: Primary Care Provider AEB - can read therapy notes in Epic, Psychiatrist AEB - can read therapy notes in Epic, and Other provider involved  in patient's care AEB - can read therapy notes in Epic  Patient/Guardian was advised Release of Information must be obtained prior to any record release in order to collaborate their care with an outside provider. Patient/Guardian was advised if they have not already done so to contact the registration department to sign all necessary forms in order for [place] to release information regarding their care.   Consent: Patient/Guardian gives verbal consent for treatment and assignment of benefits for services provided during this visit. Patient/Guardian expressed understanding and agreed to proceed.     Lynnell Chad, LCSW 03/25/2023

## 2023-03-25 NOTE — Progress Notes (Signed)
[facility] Attending  Case discussed with Dr. White  At the time of the visit.  We reviewed the resident's history and exam and pertinent patient test results.  I agree with the assessment, diagnosis, and plan of care documented in the resident's note.  

## 2023-03-26 ENCOUNTER — Other Ambulatory Visit (HOSPITAL_COMMUNITY): Payer: Self-pay | Admitting: *Deleted

## 2023-03-26 MED ORDER — SODIUM CHLORIDE 0.9 % IV SOLN
500.0000 mg | Freq: Once | INTRAVENOUS | Status: DC
Start: 2023-03-26 — End: 2023-03-26

## 2023-03-26 NOTE — Addendum Note (Signed)
Addended by: Adron Bene on: 03/26/2023 11:51 AM   Modules accepted: Orders

## 2023-03-26 NOTE — Addendum Note (Signed)
Addended by: Adron Bene on: 03/26/2023 11:56 AM   Modules accepted: Orders

## 2023-03-28 ENCOUNTER — Ambulatory Visit (HOSPITAL_COMMUNITY)
Admission: RE | Admit: 2023-03-28 | Discharge: 2023-03-28 | Disposition: A | Discharge status: Home / Self Care | Payer: 59 | Source: Ambulatory Visit | Attending: Internal Medicine | Admitting: Internal Medicine

## 2023-03-28 DIAGNOSIS — D509 Iron deficiency anemia, unspecified: Secondary | ICD-10-CM | POA: Insufficient documentation

## 2023-03-28 MED ORDER — IRON SUCROSE 500 MG IVPB - SIMPLE MED
500.0000 mg | Freq: Once | INTRAVENOUS | Status: AC
  Administered 2023-03-28: 500 mg via INTRAVENOUS
  Filled 2023-03-28: qty 500

## 2023-03-28 MED ORDER — SODIUM CHLORIDE 0.9 % IV SOLN: 500.0000 mg | Freq: Once | INTRAVENOUS | Status: DC

## 2023-03-29 ENCOUNTER — Ambulatory Visit (INDEPENDENT_AMBULATORY_CARE_PROVIDER_SITE_OTHER): Payer: 59 | Admitting: Clinical

## 2023-03-29 ENCOUNTER — Encounter (HOSPITAL_COMMUNITY): Payer: Self-pay | Admitting: Clinical

## 2023-03-29 DIAGNOSIS — F411 Generalized anxiety disorder: Secondary | ICD-10-CM

## 2023-03-29 DIAGNOSIS — F1421 Cocaine dependence, in remission: Secondary | ICD-10-CM | POA: Diagnosis not present

## 2023-03-29 DIAGNOSIS — F332 Major depressive disorder, recurrent severe without psychotic features: Secondary | ICD-10-CM
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
## 2023-04-01 ENCOUNTER — Encounter: Payer: 59 | Admitting: Student

## 2023-04-01 ENCOUNTER — Encounter: Payer: Self-pay | Admitting: Podiatry

## 2023-04-01 ENCOUNTER — Ambulatory Visit (INDEPENDENT_AMBULATORY_CARE_PROVIDER_SITE_OTHER): Payer: 59 | Admitting: Podiatry

## 2023-04-01 DIAGNOSIS — E1149 Type 2 diabetes mellitus with other diabetic neurological complication: Secondary | ICD-10-CM | POA: Diagnosis not present

## 2023-04-01 DIAGNOSIS — L84 Corns and callosities: Secondary | ICD-10-CM

## 2023-04-01 DIAGNOSIS — B351 Tinea unguium: Secondary | ICD-10-CM

## 2023-04-01 DIAGNOSIS — M79674 Pain in right toe(s): Secondary | ICD-10-CM

## 2023-04-01 DIAGNOSIS — M79675 Pain in left toe(s): Secondary | ICD-10-CM

## 2023-04-01 NOTE — Progress Notes (Signed)
Subjective: Chief Complaint  Patient presents with   Debridement    Trim toenails/calluses-diabetic - last A1c was 19.71   62 year old male presents the office for above concerns.  He says his nails and calluses are causing pain.  Denies any open lesions.  Objective: AAO x3, NAD DP/PT pulses palpable bilaterally, CRT less than 3 seconds Sensation decreased Previous left fourth and fifth amputations.  Nails are hypertrophic, dystrophic with yellow, brown discoloration possible debrided.  Nails affected and causing discomfort nails 1 through 3 on the left and 1 through 5 on the right.  Hyperkeratotic lesion submetatarsal 2 on the right foot without any underlying ulceration drainage or signs of infection as well as on the previous amputation of the left foot laterally.  There is no ulcerations. No pain with calf compression, swelling, warmth, erythema  Assessment: Symptomatic onychosis, preulcerative calluses  Plan: -All treatment options discussed with the patient including all alternatives, risks, complications.  -Sharply debrided nails x 10 without any complications or bleeding. -Separate debrided hyperkeratotic lesions x 2 without any complications or bleeding.  Continue moisturizer, offloading. -Daily foot inspection, glucose control. -Patient encouraged to call the office with any questions, concerns, change in symptoms.   Vivi Barrack DPM

## 2023-04-03 ENCOUNTER — Ambulatory Visit (HOSPITAL_COMMUNITY): Admission: RE | Admit: 2023-04-03 | Payer: 59 | Source: Ambulatory Visit | Admitting: Orthopedic Surgery

## 2023-04-03 ENCOUNTER — Encounter (HOSPITAL_COMMUNITY): Admission: RE | Payer: Self-pay | Source: Ambulatory Visit

## 2023-04-03 SURGERY — ARTHROPLASTY, KNEE, TOTAL
Anesthesia: Spinal | Site: Knee | Laterality: Right

## 2023-04-04 ENCOUNTER — Other Ambulatory Visit (HOSPITAL_COMMUNITY): Payer: Self-pay

## 2023-04-05 ENCOUNTER — Other Ambulatory Visit: Payer: Self-pay

## 2023-04-05 ENCOUNTER — Encounter (HOSPITAL_COMMUNITY): Payer: Self-pay

## 2023-04-05 ENCOUNTER — Observation Stay (HOSPITAL_COMMUNITY)
Admission: EM | Admit: 2023-04-05 | Discharge: 2023-04-09 | Disposition: A | Discharge status: Home / Self Care | Payer: 59 | Attending: Internal Medicine | Admitting: Internal Medicine

## 2023-04-05 ENCOUNTER — Other Ambulatory Visit: Payer: Self-pay | Admitting: *Deleted

## 2023-04-05 ENCOUNTER — Emergency Department (HOSPITAL_COMMUNITY): Payer: 59

## 2023-04-05 DIAGNOSIS — F17201 Nicotine dependence, unspecified, in remission: Secondary | ICD-10-CM

## 2023-04-05 DIAGNOSIS — Z1152 Encounter for screening for COVID-19: Secondary | ICD-10-CM | POA: Insufficient documentation

## 2023-04-05 DIAGNOSIS — J9601 Acute respiratory failure with hypoxia: Secondary | ICD-10-CM | POA: Diagnosis not present

## 2023-04-05 DIAGNOSIS — Z7902 Long term (current) use of antithrombotics/antiplatelets: Secondary | ICD-10-CM | POA: Insufficient documentation

## 2023-04-05 DIAGNOSIS — E119 Type 2 diabetes mellitus without complications: Secondary | ICD-10-CM | POA: Insufficient documentation

## 2023-04-05 DIAGNOSIS — I10 Essential (primary) hypertension: Secondary | ICD-10-CM | POA: Diagnosis not present

## 2023-04-05 DIAGNOSIS — Z87891 Personal history of nicotine dependence: Secondary | ICD-10-CM | POA: Insufficient documentation

## 2023-04-05 DIAGNOSIS — F17200 Nicotine dependence, unspecified, uncomplicated: Secondary | ICD-10-CM | POA: Insufficient documentation

## 2023-04-05 DIAGNOSIS — Z79899 Other long term (current) drug therapy: Secondary | ICD-10-CM | POA: Diagnosis not present

## 2023-04-05 DIAGNOSIS — J441 Chronic obstructive pulmonary disease with (acute) exacerbation: Secondary | ICD-10-CM | POA: Insufficient documentation

## 2023-04-05 DIAGNOSIS — Z7982 Long term (current) use of aspirin: Secondary | ICD-10-CM | POA: Diagnosis not present

## 2023-04-05 DIAGNOSIS — R0602 Shortness of breath: Secondary | ICD-10-CM | POA: Diagnosis present

## 2023-04-05 DIAGNOSIS — E1151 Type 2 diabetes mellitus with diabetic peripheral angiopathy without gangrene: Secondary | ICD-10-CM | POA: Diagnosis present

## 2023-04-05 DIAGNOSIS — J45909 Unspecified asthma, uncomplicated: Secondary | ICD-10-CM | POA: Insufficient documentation

## 2023-04-05 DIAGNOSIS — I2510 Atherosclerotic heart disease of native coronary artery without angina pectoris: Secondary | ICD-10-CM | POA: Diagnosis present

## 2023-04-05 DIAGNOSIS — D509 Iron deficiency anemia, unspecified: Secondary | ICD-10-CM | POA: Diagnosis present

## 2023-04-05 DIAGNOSIS — J4489 Other specified chronic obstructive pulmonary disease: Secondary | ICD-10-CM

## 2023-04-05 DIAGNOSIS — Z7985 Long-term (current) use of injectable non-insulin antidiabetic drugs: Secondary | ICD-10-CM | POA: Diagnosis not present

## 2023-04-05 DIAGNOSIS — J45901 Unspecified asthma with (acute) exacerbation: Secondary | ICD-10-CM | POA: Diagnosis present

## 2023-04-05 LAB — I-STAT VENOUS BLOOD GAS, ED
Acid-base deficit: 2 mmol/L (ref 0.0–2.0)
Bicarbonate: 22.1 mmol/L (ref 20.0–28.0)
Calcium, Ion: 1.09 mmol/L — ABNORMAL LOW (ref 1.15–1.40)
HCT: 40 % (ref 39.0–52.0)
Hemoglobin: 13.6 g/dL (ref 13.0–17.0)
O2 Saturation: 96 %
Potassium: 3.9 mmol/L (ref 3.5–5.1)
Sodium: 139 mmol/L (ref 135–145)
TCO2: 23 mmol/L (ref 22–32)
pCO2, Ven: 34.9 mmHg — ABNORMAL LOW (ref 44–60)
pH, Ven: 7.41 (ref 7.25–7.43)
pO2, Ven: 77 mmHg — ABNORMAL HIGH (ref 32–45)

## 2023-04-05 LAB — BASIC METABOLIC PANEL
Anion gap: 10 (ref 5–15)
BUN: 15 mg/dL (ref 8–23)
CO2: 22 mmol/L (ref 22–32)
Calcium: 8.9 mg/dL (ref 8.9–10.3)
Chloride: 106 mmol/L (ref 98–111)
Creatinine, Ser: 1.08 mg/dL (ref 0.61–1.24)
GFR, Estimated: >60 mL/min (ref >60)
Glucose, Bld: 127 mg/dL — ABNORMAL HIGH (ref 70–99)
Potassium: 3.9 mmol/L (ref 3.5–5.1)
Sodium: 138 mmol/L (ref 135–145)

## 2023-04-05 LAB — CBC WITH DIFFERENTIAL/PLATELET
Abs Immature Granulocytes: 0.03 10*3/uL (ref 0.00–0.07)
Basophils Absolute: 0.1 10*3/uL (ref 0.0–0.1)
Basophils Relative: 1 %
Eosinophils Absolute: 0.1 10*3/uL (ref 0.0–0.5)
Eosinophils Relative: 1 %
HCT: 38.8 % — ABNORMAL LOW (ref 39.0–52.0)
Hemoglobin: 11.2 g/dL — ABNORMAL LOW (ref 13.0–17.0)
Immature Granulocytes: 1 %
Lymphocytes Relative: 26 %
Lymphs Abs: 1.3 10*3/uL (ref 0.7–4.0)
MCH: 20.6 pg — ABNORMAL LOW (ref 26.0–34.0)
MCHC: 28.9 g/dL — ABNORMAL LOW (ref 30.0–36.0)
MCV: 71.5 fL — ABNORMAL LOW (ref 80.0–100.0)
Monocytes Absolute: 0.6 10*3/uL (ref 0.1–1.0)
Monocytes Relative: 12 %
Neutro Abs: 3 10*3/uL (ref 1.7–7.7)
Neutrophils Relative %: 59 %
Platelets: 310 10*3/uL (ref 150–400)
RBC: 5.43 MIL/uL (ref 4.22–5.81)
RDW: 22.7 % — ABNORMAL HIGH (ref 11.5–15.5)
WBC: 5 10*3/uL (ref 4.0–10.5)
nRBC: 0 % (ref 0.0–0.2)

## 2023-04-05 LAB — RESP PANEL BY RT-PCR (RSV, FLU A&B, COVID)  RVPGX2
Influenza A by PCR: NEGATIVE
Influenza B by PCR: NEGATIVE
Resp Syncytial Virus by PCR: NEGATIVE
SARS Coronavirus 2 by RT PCR: NEGATIVE

## 2023-04-05 LAB — GLUCOSE, CAPILLARY: Glucose-Capillary: 170 mg/dL — ABNORMAL HIGH (ref 70–99)

## 2023-04-05 LAB — BRAIN NATRIURETIC PEPTIDE: B Natriuretic Peptide: 28.7 pg/mL (ref 0.0–100.0)

## 2023-04-05 MED ORDER — AZITHROMYCIN 500 MG PO TABS: 500.0000 mg | ORAL_TABLET | Freq: Every day | ORAL | Status: DC

## 2023-04-05 MED ORDER — IPRATROPIUM-ALBUTEROL 0.5-2.5 (3) MG/3ML IN SOLN
3.0000 mL | RESPIRATORY_TRACT | Status: AC
  Administered 2023-04-05 (×2): 3 mL via RESPIRATORY_TRACT
  Filled 2023-04-05: qty 6

## 2023-04-05 MED ORDER — MELATONIN 3 MG PO TABS
3.0000 mg | ORAL_TABLET | Freq: Every day | ORAL | Status: DC
  Administered 2023-04-05 – 2023-04-08 (×4): 3 mg via ORAL
  Filled 2023-04-05 (×4): qty 1

## 2023-04-05 MED ORDER — INSULIN ASPART 100 UNIT/ML IJ SOLN
0.0000 [IU] | Freq: Every day | INTRAMUSCULAR | Status: DC
  Administered 2023-04-07 – 2023-04-08 (×2): 3 [IU] via SUBCUTANEOUS

## 2023-04-05 MED ORDER — ENOXAPARIN SODIUM 40 MG/0.4ML IJ SOSY: 40.0000 mg | PREFILLED_SYRINGE | Freq: Every day | INTRAMUSCULAR | Status: DC

## 2023-04-05 MED ORDER — SENNOSIDES-DOCUSATE SODIUM 8.6-50 MG PO TABS
1.0000 | ORAL_TABLET | Freq: Every evening | ORAL | Status: DC | PRN
  Administered 2023-04-08: 1 via ORAL
  Filled 2023-04-05: qty 1

## 2023-04-05 MED ORDER — ALBUTEROL SULFATE (2.5 MG/3ML) 0.083% IN NEBU
10.0000 mg/h | INHALATION_SOLUTION | Freq: Once | RESPIRATORY_TRACT | Status: AC
  Administered 2023-04-05: 10 mg/h via RESPIRATORY_TRACT

## 2023-04-05 MED ORDER — IPRATROPIUM-ALBUTEROL 0.5-2.5 (3) MG/3ML IN SOLN
9.0000 mL | Freq: Once | RESPIRATORY_TRACT | Status: AC
  Administered 2023-04-05: 9 mL via RESPIRATORY_TRACT
  Filled 2023-04-05: qty 9

## 2023-04-05 MED ORDER — BUDESONIDE 0.5 MG/2ML IN SUSP
0.5000 mg | Freq: Two times a day (BID) | RESPIRATORY_TRACT | Status: DC
  Administered 2023-04-06 – 2023-04-09 (×8): 0.5 mg via RESPIRATORY_TRACT
  Filled 2023-04-05 (×8): qty 2

## 2023-04-05 MED ORDER — BISACODYL 5 MG PO TBEC
5.0000 mg | DELAYED_RELEASE_TABLET | Freq: Every day | ORAL | Status: DC | PRN
  Administered 2023-04-08: 5 mg via ORAL
  Filled 2023-04-05: qty 1

## 2023-04-05 MED ORDER — INSULIN ASPART 100 UNIT/ML IJ SOLN
0.0000 [IU] | Freq: Three times a day (TID) | INTRAMUSCULAR | Status: DC
  Administered 2023-04-06 (×2): 2 [IU] via SUBCUTANEOUS
  Administered 2023-04-06: 7 [IU] via SUBCUTANEOUS
  Administered 2023-04-07: 5 [IU] via SUBCUTANEOUS
  Administered 2023-04-07: 7 [IU] via SUBCUTANEOUS
  Administered 2023-04-07: 1 [IU] via SUBCUTANEOUS
  Administered 2023-04-08: 7 [IU] via SUBCUTANEOUS
  Administered 2023-04-08: 5 [IU] via SUBCUTANEOUS
  Administered 2023-04-08: 3 [IU] via SUBCUTANEOUS
  Administered 2023-04-09: 9 [IU] via SUBCUTANEOUS
  Administered 2023-04-09: 2 [IU] via SUBCUTANEOUS

## 2023-04-05 MED ORDER — PREGABALIN 100 MG PO CAPS
100.0000 mg | ORAL_CAPSULE | Freq: Three times a day (TID) | ORAL | Status: DC
  Administered 2023-04-05 – 2023-04-09 (×11): 100 mg via ORAL
  Filled 2023-04-05 (×11): qty 1

## 2023-04-05 MED ORDER — ASPIRIN 81 MG PO TBEC
81.0000 mg | DELAYED_RELEASE_TABLET | Freq: Every day | ORAL | Status: DC
  Administered 2023-04-06 – 2023-04-09 (×4): 81 mg via ORAL
  Filled 2023-04-05 (×4): qty 1

## 2023-04-05 MED ORDER — FLUTICASONE PROPIONATE 50 MCG/ACT NA SUSP
1.0000 | Freq: Every day | NASAL | Status: DC
  Administered 2023-04-06 – 2023-04-09 (×3): 1 via NASAL
  Filled 2023-04-05: qty 16

## 2023-04-05 MED ORDER — ATORVASTATIN CALCIUM 40 MG PO TABS
40.0000 mg | ORAL_TABLET | Freq: Every day | ORAL | Status: DC
  Administered 2023-04-06 – 2023-04-09 (×4): 40 mg via ORAL
  Filled 2023-04-05 (×4): qty 1

## 2023-04-05 MED ORDER — CLOPIDOGREL BISULFATE 75 MG PO TABS
75.0000 mg | ORAL_TABLET | Freq: Every day | ORAL | Status: DC
  Administered 2023-04-06 – 2023-04-09 (×4): 75 mg via ORAL
  Filled 2023-04-05 (×4): qty 1

## 2023-04-05 MED ORDER — METHYLPREDNISOLONE SODIUM SUCC 125 MG IJ SOLR
125.0000 mg | Freq: Once | INTRAMUSCULAR | Status: AC
  Administered 2023-04-05: 125 mg via INTRAVENOUS
  Filled 2023-04-05: qty 2

## 2023-04-05 MED ORDER — ACETAMINOPHEN 650 MG RE SUPP: 650.0000 mg | Freq: Four times a day (QID) | RECTAL | Status: DC | PRN

## 2023-04-05 MED ORDER — ALBUTEROL SULFATE (2.5 MG/3ML) 0.083% IN NEBU
INHALATION_SOLUTION | RESPIRATORY_TRACT | Status: AC
  Filled 2023-04-05: qty 6

## 2023-04-05 MED ORDER — IPRATROPIUM-ALBUTEROL 0.5-2.5 (3) MG/3ML IN SOLN
3.0000 mL | RESPIRATORY_TRACT | Status: DC
  Administered 2023-04-06 (×2): 3 mL via RESPIRATORY_TRACT
  Filled 2023-04-05 (×2): qty 3

## 2023-04-05 MED ORDER — ALBUTEROL SULFATE HFA 108 (90 BASE) MCG/ACT IN AERS
1.0000 | INHALATION_SPRAY | RESPIRATORY_TRACT | 2 refills | Status: DC | PRN
Start: 2023-04-05 — End: 2023-06-05

## 2023-04-05 MED ORDER — MAGNESIUM SULFATE 2 GM/50ML IV SOLN
2.0000 g | Freq: Once | INTRAVENOUS | Status: AC
  Administered 2023-04-05: 2 g via INTRAVENOUS
  Filled 2023-04-05: qty 50

## 2023-04-05 MED ORDER — ACETAMINOPHEN 325 MG PO TABS: 650.0000 mg | ORAL_TABLET | Freq: Four times a day (QID) | ORAL | Status: DC | PRN

## 2023-04-05 MED ORDER — MONTELUKAST SODIUM 10 MG PO TABS
10.0000 mg | ORAL_TABLET | Freq: Every day | ORAL | Status: DC
  Administered 2023-04-05 – 2023-04-08 (×4): 10 mg via ORAL
  Filled 2023-04-05 (×4): qty 1
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
## 2023-04-05 NOTE — Telephone Encounter (Signed)
Patient notified that Rx has been sent and he is very appreciative. 
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
## 2023-04-05 NOTE — Telephone Encounter (Signed)
Patient calling back for refill on albuterol. States he needs this filled today.

## 2023-04-06 DIAGNOSIS — Z7984 Long term (current) use of oral hypoglycemic drugs: Secondary | ICD-10-CM

## 2023-04-06 DIAGNOSIS — E119 Type 2 diabetes mellitus without complications: Secondary | ICD-10-CM | POA: Diagnosis not present

## 2023-04-06 DIAGNOSIS — E1142 Type 2 diabetes mellitus with diabetic polyneuropathy: Secondary | ICD-10-CM

## 2023-04-06 DIAGNOSIS — I2510 Atherosclerotic heart disease of native coronary artery without angina pectoris: Secondary | ICD-10-CM | POA: Diagnosis not present

## 2023-04-06 DIAGNOSIS — Z1152 Encounter for screening for COVID-19: Secondary | ICD-10-CM | POA: Diagnosis not present

## 2023-04-06 DIAGNOSIS — Z87891 Personal history of nicotine dependence: Secondary | ICD-10-CM

## 2023-04-06 DIAGNOSIS — Z794 Long term (current) use of insulin: Secondary | ICD-10-CM

## 2023-04-06 DIAGNOSIS — J9601 Acute respiratory failure with hypoxia: Secondary | ICD-10-CM | POA: Diagnosis not present

## 2023-04-06 DIAGNOSIS — F17200 Nicotine dependence, unspecified, uncomplicated: Secondary | ICD-10-CM | POA: Insufficient documentation

## 2023-04-06 DIAGNOSIS — F17201 Nicotine dependence, unspecified, in remission: Secondary | ICD-10-CM | POA: Insufficient documentation

## 2023-04-06 DIAGNOSIS — D509 Iron deficiency anemia, unspecified: Secondary | ICD-10-CM

## 2023-04-06 DIAGNOSIS — J441 Chronic obstructive pulmonary disease with (acute) exacerbation: Secondary | ICD-10-CM | POA: Diagnosis not present

## 2023-04-06 LAB — GLUCOSE, CAPILLARY
Glucose-Capillary: 195 mg/dL — ABNORMAL HIGH (ref 70–99)
Glucose-Capillary: 184 mg/dL — ABNORMAL HIGH (ref 70–99)
Glucose-Capillary: 332 mg/dL — ABNORMAL HIGH (ref 70–99)
Glucose-Capillary: 186 mg/dL — ABNORMAL HIGH (ref 70–99)

## 2023-04-06 LAB — TROPONIN I (HIGH SENSITIVITY)
Troponin I (High Sensitivity): 3 ng/L (ref <18)
Troponin I (High Sensitivity): 3 ng/L (ref <18)

## 2023-04-06 MED ORDER — EMPAGLIFLOZIN 25 MG PO TABS
25.0000 mg | ORAL_TABLET | Freq: Every day | ORAL | Status: DC
  Administered 2023-04-06 – 2023-04-09 (×4): 25 mg via ORAL
  Filled 2023-04-06 (×4): qty 1

## 2023-04-06 MED ORDER — GUAIFENESIN-DM 100-10 MG/5ML PO SYRP
5.0000 mL | ORAL_SOLUTION | ORAL | Status: DC | PRN
  Administered 2023-04-06 – 2023-04-07 (×4): 5 mL via ORAL
  Filled 2023-04-06 (×4): qty 10

## 2023-04-06 MED ORDER — PIOGLITAZONE HCL 30 MG PO TABS
30.0000 mg | ORAL_TABLET | Freq: Every day | ORAL | Status: DC
  Administered 2023-04-06 – 2023-04-09 (×4): 30 mg via ORAL
  Filled 2023-04-06 (×4): qty 1

## 2023-04-06 MED ORDER — IPRATROPIUM-ALBUTEROL 0.5-2.5 (3) MG/3ML IN SOLN
3.0000 mL | Freq: Four times a day (QID) | RESPIRATORY_TRACT | Status: DC
  Administered 2023-04-06 – 2023-04-07 (×4): 3 mL via RESPIRATORY_TRACT
  Filled 2023-04-06 (×4): qty 3

## 2023-04-06 MED ORDER — ENOXAPARIN SODIUM 40 MG/0.4ML IJ SOSY
40.0000 mg | PREFILLED_SYRINGE | INTRAMUSCULAR | Status: DC
  Administered 2023-04-06 – 2023-04-08 (×3): 40 mg via SUBCUTANEOUS
  Filled 2023-04-06 (×3): qty 0.4

## 2023-04-06 MED ORDER — AZITHROMYCIN 500 MG PO TABS
500.0000 mg | ORAL_TABLET | Freq: Every day | ORAL | Status: AC
  Administered 2023-04-06 – 2023-04-08 (×3): 500 mg via ORAL
  Filled 2023-04-06 (×3): qty 1

## 2023-04-06 MED ORDER — PREDNISONE 20 MG PO TABS
40.0000 mg | ORAL_TABLET | Freq: Every day | ORAL | Status: AC
  Administered 2023-04-06 – 2023-04-09 (×4): 40 mg via ORAL
  Filled 2023-04-06 (×4): qty 2

## 2023-04-06 MED ORDER — ALBUTEROL SULFATE (2.5 MG/3ML) 0.083% IN NEBU
2.5000 mg | INHALATION_SOLUTION | RESPIRATORY_TRACT | Status: DC | PRN
  Administered 2023-04-06 – 2023-04-07 (×2): 2.5 mg via RESPIRATORY_TRACT
  Filled 2023-04-06 (×2): qty 3

## 2023-04-06 NOTE — Progress Notes (Signed)
Patient arrived to the room and ambulated to the bed. Patient is alert and oriented x4, reported R knee pain 10/10; Gwenevere Abbot, MD was notified of patient's pain. Patient was placed on tele monitor with cont. Pulse ox, telemetry unit was called and verified.  BP 142/79, HR 113; Gwenevere Abbot, MD aware. Patient have his cell phone, top and bottom denture in place, eye glasses in place, and personal clothing at bedside. Patient was oriented to the room, call bell and staff.    Richard [name]

## 2023-04-06 NOTE — Care Management Obs Status (Signed)
MEDICARE OBSERVATION STATUS NOTIFICATION   Patient Details  Name: Richard [name] MRN: [id number] Date of Birth: May 03, 1961   Medicare Observation Status Notification Given:  Yes    Alixandrea Milleson G., RN 04/06/2023, 2:34 PM

## 2023-04-06 NOTE — Progress Notes (Signed)
HD#0 Subjective:  Overnight Events: NA  Patient is sitting at bedside.  He has severe paroxysmal coughing spells during examination.  He said that he is not back to his baseline.  He thinks the trigger of this exacerbation was heat, allergens and smoking.  His daughter is on board of helping him quitting smoking.  Objective:  Vital signs in last 24 hours: Vitals:   04/06/23 0516 04/06/23 0732 04/06/23 0830 04/06/23 1255  BP: 128/72 131/76  (!) 145/82  Pulse: (!) 108 (!) 104 (!) 102 96  Resp: 15  (!) 22 20  Temp: 97.9 F (36.6 C) 98.1 F (36.7 C)  98.5 F (36.9 C)  TempSrc: Oral Oral  Oral  SpO2: 98% 98% 99% 99%  Weight:      Height:       Supplemental O2: Room Air SpO2: 99 %   Physical Exam:  Physical Exam Constitutional:      General: He is in acute distress.  HENT:     Head: Normocephalic and atraumatic.  Eyes:     General:        Right eye: No discharge.        Left eye: No discharge.     Conjunctiva/sclera: Conjunctivae normal.  Cardiovascular:     Rate and Rhythm: Normal rate and regular rhythm.     Heart sounds: No murmur heard. Pulmonary:     Comments: Diffuse expiratory wheezing heard.  No crackles. Abdominal:     General: Bowel sounds are normal.     Palpations: Abdomen is soft.  Musculoskeletal:     Right lower leg: No edema.     Left lower leg: No edema.  Skin:    General: Skin is warm.  Neurological:     Mental Status: Mental status is at baseline.  Psychiatric:        Mood and Affect: Mood normal.     Filed Weights   04/05/23 1919  Weight: 102.5 kg     Intake/Output Summary (Last 24 hours) at 04/06/2023 1339 Last data filed at 04/06/2023 1100 Gross per 24 hour  Intake 600 ml  Output 690 ml  Net -90 ml   Net IO Since Admission: -90 mL [04/06/23 1339]  Pertinent Labs:    Latest Ref Rng & Units 04/05/2023    7:33 PM 04/05/2023    7:19 PM 03/21/2023    8:51 AM  CBC  WBC 4.0 - 10.5 K/uL  5.0  4.3   Hemoglobin 13.0 - 17.0 g/dL  16.1  09.6  9.7   Hematocrit 39.0 - 52.0 % 40.0  38.8  33.1   Platelets 150 - 400 K/uL  310  344        Latest Ref Rng & Units 04/05/2023    7:33 PM 04/05/2023    7:19 PM 03/21/2023    8:51 AM  CMP  Glucose 70 - 99 mg/dL  045  409   BUN 8 - 23 mg/dL  15  22   Creatinine 8.11 - 1.24 mg/dL  9.14  7.82   Sodium 956 - 145 mmol/L 139  138  135   Potassium 3.5 - 5.1 mmol/L 3.9  3.9  4.8   Chloride 98 - 111 mmol/L  106  104   CO2 22 - 32 mmol/L  22  23   Calcium 8.9 - 10.3 mg/dL  8.9  8.8   Total Protein 6.5 - 8.1 g/dL   7.5   Total Bilirubin 0.3 - 1.2 mg/dL  0.5   Alkaline Phos 38 - 126 U/L   67   AST 15 - 41 U/L   19   ALT 0 - 44 U/L   17     Imaging: DG Chest Port 1 View  Result Date: 04/05/2023 CLINICAL DATA:  Short of breath EXAM: PORTABLE CHEST 1 VIEW COMPARISON:  02/10/2023 FINDINGS: 2 frontal views of the chest demonstrate an unremarkable cardiac silhouette. No acute airspace disease, effusion, or pneumothorax. No acute bony abnormalities. IMPRESSION: 1. No acute intrathoracic process. Electronically Signed   By: Sharlet Salina M.D.   On: 04/05/2023 20:04    Assessment/Plan:   Principal Problem:   Acute hypoxic respiratory failure (HCC) Active Problems:   Essential hypertension   Type 2 diabetes mellitus with peripheral artery disease (HCC)   CAD in native artery   Acute exacerbation of COPD with asthma (HCC)   Tobacco use disorder   Patient Summary: Richard [name] is a 62 y.o. with a pertinent PMH of COPD/asthma, hypertension, type 2 diabetes, CAD, who was admitted for acute hypoxic respiratory failure from a COPD exacerbation.  Acute hypoxic respiratory failure - improved COPD exacerbation Tobacco use disorder His trigger is multifactorial including allergens and his smoking.  Patient is no longer hypoxic but still has significant wheezing and coughing spells on exam. -Continue DuoNebs every 6h and budesonide -Prednisone 40 mg x 4 days -Azithromycin 500 mg daily  x 3 days -Dextromethorphan as needed for cough.  -Resume home montelukast. -I reemphasized the importance of smoking cessation to prevent progression of his COPD and last asthma.  Patient and daughter are both on board of quitting smoking.  Can offer Chantix prior to discharge if patient agrees.  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  His CBG may be elevated while on prednisone  IDA Outpatient work up ongoing. Iron level was low at 18, ferritin at 6 and iron saturation at 4 %. Was given IV Iron Sucrose 500 mg once. Hgb improved from 9.7 to 11.2.  Hemoglobin improved to 11.2. 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 Will need outpatient colonoscopy. No current symptoms of blood loss.   Diet: Normal IVF: None, VTE: Enoxaparin Code: Full PT/OT recs: Pending TOC recs:   Dispo: Anticipated discharge to Home in 1 days pending improvement of respiratory status.   Doran Stabler, DO 04/06/2023, 1:39 PM Pager: [phone]  Please contact the on call pager after 5 pm and on weekends at [phone].

## 2023-04-07 LAB — CBC
HCT: 35 % — ABNORMAL LOW (ref 39.0–52.0)
Hemoglobin: 10.2 g/dL — ABNORMAL LOW (ref 13.0–17.0)
MCH: 20.9 pg — ABNORMAL LOW (ref 26.0–34.0)
MCHC: 29.1 g/dL — ABNORMAL LOW (ref 30.0–36.0)
MCV: 71.9 fL — ABNORMAL LOW (ref 80.0–100.0)
Platelets: 240 10*3/uL (ref 150–400)
RBC: 4.87 MIL/uL (ref 4.22–5.81)
RDW: 23.3 % — ABNORMAL HIGH (ref 11.5–15.5)
WBC: 7.5 10*3/uL (ref 4.0–10.5)
nRBC: 0 % (ref 0.0–0.2)

## 2023-04-07 LAB — GLUCOSE, CAPILLARY
Glucose-Capillary: 147 mg/dL — ABNORMAL HIGH (ref 70–99)
Glucose-Capillary: 223 mg/dL — ABNORMAL HIGH (ref 70–99)
Glucose-Capillary: 259 mg/dL — ABNORMAL HIGH (ref 70–99)
Glucose-Capillary: 315 mg/dL — ABNORMAL HIGH (ref 70–99)
Glucose-Capillary: 285 mg/dL — ABNORMAL HIGH (ref 70–99)

## 2023-04-07 MED ORDER — IPRATROPIUM-ALBUTEROL 0.5-2.5 (3) MG/3ML IN SOLN
3.0000 mL | RESPIRATORY_TRACT | Status: DC
  Administered 2023-04-07 – 2023-04-09 (×12): 3 mL via RESPIRATORY_TRACT
  Filled 2023-04-07 (×12): qty 3

## 2023-04-07 MED ORDER — ALBUTEROL SULFATE (2.5 MG/3ML) 0.083% IN NEBU
2.5000 mg | INHALATION_SOLUTION | RESPIRATORY_TRACT | Status: DC | PRN
  Administered 2023-04-07: 2.5 mg via RESPIRATORY_TRACT
  Filled 2023-04-07: qty 3

## 2023-04-07 MED ORDER — ALBUTEROL SULFATE (2.5 MG/3ML) 0.083% IN NEBU: 2.5000 mg | INHALATION_SOLUTION | RESPIRATORY_TRACT | Status: DC

## 2023-04-07 NOTE — Progress Notes (Signed)
HD#0 Subjective:   Richard [name] is a 62 y.o. with a pertinent PMH of COPD/asthma, hypertension, type 2 diabetes, CAD, who was admitted for acute hypoxic respiratory failure from a COPD exacerbation with continued paroxysmal cough and significant wheezing  Overnight Events: NA  Patient with improved cough, however, not much improvement in wheezing. Was still getting dyspneic with speech and mild exertion. He was able to sleep better this AM after continued cough suppression. No chest pain, presyncopal episodes, increased cough production. Patient has been only been able to tolerate liquid PO intake  Objective:  Vital signs in last 24 hours: Vitals:   04/07/23 0010 04/07/23 0146 04/07/23 0459 04/07/23 0614  BP: 126/74  127/85   Pulse: 97  95   Resp: 15  17   Temp: 98.1 F (36.7 C)  98 F (36.7 C)   TempSrc: Oral  Oral   SpO2: 96% 94% 97% 94%  Weight:      Height:       Supplemental O2: Room Air SpO2: 94 %   Physical Exam:  Physical Exam Constitutional:      General: He is in acute distress.  HENT:     Head: Normocephalic and atraumatic.  Eyes:     General:        Right eye: No discharge.        Left eye: No discharge.     Conjunctiva/sclera: Conjunctivae normal.  Cardiovascular:     Rate and Rhythm: Normal rate and regular rhythm.     Heart sounds: No murmur heard. Pulmonary:     Comments: Diffuse expiratory wheezing heard.  No crackles. Abdominal:     General: Bowel sounds are normal.     Palpations: Abdomen is soft.  Musculoskeletal:     Right lower leg: No edema.     Left lower leg: No edema.  Skin:    General: Skin is warm.  Neurological:     Mental Status: Mental status is at baseline.  Psychiatric:        Mood and Affect: Mood normal.    Man sitting in chair in mild distress while speaking Regular rate and rhythm Mild work of breathing without overt accessory muscle use. No crackles or rales on exam, however, inspiratory and expiratory wheezing  audible without stethoscope BS present, soft abdomen Warm and dry skin Alert and oriented, conversing, though dyspneic and with significant wheeze with speech Pleasant mood   Filed Weights   04/05/23 1919  Weight: 102.5 kg     Intake/Output Summary (Last 24 hours) at 04/07/2023 0639 Last data filed at 04/06/2023 1816 Gross per 24 hour  Intake 720 ml  Output 150 ml  Net 570 ml   Net IO Since Admission: 390 mL [04/07/23 0639]  Pertinent Labs:    Latest Ref Rng & Units 04/07/2023    3:33 AM 04/05/2023    7:33 PM 04/05/2023    7:19 PM  CBC  WBC 4.0 - 10.5 K/uL 7.5   5.0   Hemoglobin 13.0 - 17.0 g/dL 40.9  81.1  91.4   Hematocrit 39.0 - 52.0 % 35.0  40.0  38.8   Platelets 150 - 400 K/uL 240   310        Latest Ref Rng & Units 04/05/2023    7:33 PM 04/05/2023    7:19 PM 03/21/2023    8:51 AM  CMP  Glucose 70 - 99 mg/dL  782  956   BUN 8 - 23 mg/dL  15  22   Creatinine 0.61 - 1.24 mg/dL  1.61  0.96   Sodium 045 - 145 mmol/L 139  138  135   Potassium 3.5 - 5.1 mmol/L 3.9  3.9  4.8   Chloride 98 - 111 mmol/L  106  104   CO2 22 - 32 mmol/L  22  23   Calcium 8.9 - 10.3 mg/dL  8.9  8.8   Total Protein 6.5 - 8.1 g/dL   7.5   Total Bilirubin 0.3 - 1.2 mg/dL   0.5   Alkaline Phos 38 - 126 U/L   67   AST 15 - 41 U/L   19   ALT 0 - 44 U/L   17     Imaging: No results found.  Assessment/Plan:   Principal Problem:   Acute hypoxic respiratory failure (HCC) Active Problems:   Essential hypertension   Type 2 diabetes mellitus with peripheral artery disease (HCC)   CAD in native artery   Acute exacerbation of COPD with asthma (HCC)   Tobacco use disorder  Acute hypoxic respiratory failure - improved COPD - asthma exacerbation Tobacco use disorder His trigger is multifactorial including allergens and his smoking. Continues to improve from hypoxic standpoint, but had significant wheezing  on examination. Will schedule nebulized treatments for the next 24 hours today and assess  pulse oximetry with ambulation. Felt it is prudent to allow for improvement for another day and reassess in the AM. -Continue DuoNebs every 4h , scheduled, and budesonide -Prednisone 40 mg x 4 days (6/29-7/2) -Azithromycin 500 mg daily x (6/29/7/1) -Dextromethorphan as needed for cough.  -Resume home montelukast. -Discussed smoking cessation strategies; will re-engage prior to discharge  CAD -Continue aspirin, Plavix and Lipitor  Type 2 diabetes with neuropathy A1c of 7.92 weeks ago Home regimen: Synjardy, Actos and Trulicity -Continue on empagliflozin, Actos and Lyrica -Continue SSI.  His CBG may be elevated while on prednisone  IDA Outpatient work up ongoing. Iron level was low at 18, ferritin at 6 and iron saturation at 4 %. Was given IV Iron Sucrose 500 mg once. Hgb improved from 9.7 to 11.2. 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 Will need outpatient colonoscopy.   Diet: Normal IVF: None, VTE: Enoxaparin Code: Full PT/OT recs: Pending TOC recs:   Dispo: Anticipated discharge to Home in 1 days pending improvement of respiratory status.   Morene Crocker, MD 04/07/2023, 6:39 AM Pager: [phone]  Please contact the on call pager after 5 pm and on weekends at [phone].

## 2023-04-07 NOTE — Hospital Course (Signed)
Acute hypoxic respiratory failure COPD exacerbation Hx COPD-Asthma overlap Tobacco use disorder Admitted to the hospital on 6/28 with severe dyspnea admitted for COPD exacerbation in the setting of dyspnea, increased cough and sputum production. Etiology thought to be environmental triggers vs history of smoking. Patient presented with recurrence of cough paroxysms complicating his dyspnea and severe wheezing. ACS was ruled out and PE Wells was only 1.5. No new consolidation on CXR. Patient improved with initial IV solumedrol in the ED. He was started on prednisone to complete 5 day course and on empiric antibiotic treatment with azithromycin for 3 days. Patient's bronchodilator therapy was scheduled with improvement in diffuse wheezing. Ambulatory pulse oximetry ***. He is now stable for discharge. We had counseled him on tobacco cessation. He is *** not interested at this time. He will continued on his   CAD PAD Patient followed by cardiology and vascular services. He was continued on his PTA statin, plavix, and aspirin  Type 2 diabetes mellitus with neuropathy Patient was continued on empagliflozin and SSI during admission. He was discahged on Trulicity, synjardy, and Actos at discharge  Iron deficiency anemia Currently being worked up for IDA outpatient. Most recent ferritin 6 and iron saturation at 4%. Richard [name] received 500 mg x1 of iron sucrose. Hgb maintained above 10 during this admission. He will need repeat colonoscopy   PCP appointment   ----    Ready to go No reflux/heart burn No post nasal dipping No official allergy testing Decreased cravings with patch Robitussin cough meds helping Concerned about sugars with steroids--> uses Trulicity at home injection  Reviewed discharged instructions/office f/u instructions Ct to taper the steroids --> pt advised and agreed: lower steroids to 20  ----- Patient Instructions You were admitted for COPD exacerbation. We think this  is due to your known triggers like allergens in the environment and history of smoking. You were treated with antibiotics, breathing treatments, cough medication, and steroids for inflammation. Your wheezing has improved and now you are stable for discharge with the following recommendations  MEDICATIONS New Prednisone: 7/3 - Take 2 pills (total 20 mg)  7/4 - Take 2 pill (total 20 mg) 7/5 - Take 1 pilil (total 10 mg) 7/6 - Take 1 pilil (total 10 mg) 7/7 - break the remaining pill, and take one half (5 mg total) 7/8 - take the remaining one half (5 mg total)  Allegra - take one pill every 12 hours for allergies Robitussin - You can take 5 mL can use this medication every 4-6 hours as needed for cough Nicotine patches -  we are supplying you with a small number (14) of 7 mg nicotine patches to help with cravings. It is important that you attempt smoking cessation to help you get out of the hospital.   Get free tobacco cessation help 24/7 in several ways: 1-800-QUIT-NOW 213-711-4186); they can provide you with other patches and information.   Other medications - you can continue as you had been taking  APPOINTMENTS:  Please follow up with Dr. Mikey [facility]ussing [facility] on: 04/18/2023 at 8:45AM   You will also see your pulmonologist on 05/27/2023 at 8:30 - it would be good for you to discuss allergy testing to identify other potential triggers to avoid other COPD-Asthma exacerbations.  We reviewed "pursed lip breathing" strategies to help with wheezing, as well as the accurate use of an inhaler together. physical therapy evaluated you and recommend you continue working on strengthening exercises from home and the use  of assistive devices for safe mobility. You should be receiving a phone call from them to help your with set up.   It was a pleasure caring for you,  Drs. Arellano and [facility]

## 2023-04-07 NOTE — Plan of Care (Signed)

## 2023-04-08 ENCOUNTER — Other Ambulatory Visit: Payer: Self-pay

## 2023-04-08 DIAGNOSIS — J9601 Acute respiratory failure with hypoxia: Secondary | ICD-10-CM | POA: Diagnosis not present

## 2023-04-08 DIAGNOSIS — E1142 Type 2 diabetes mellitus with diabetic polyneuropathy: Secondary | ICD-10-CM | POA: Diagnosis not present

## 2023-04-08 DIAGNOSIS — Z87891 Personal history of nicotine dependence: Secondary | ICD-10-CM | POA: Diagnosis not present

## 2023-04-08 DIAGNOSIS — J441 Chronic obstructive pulmonary disease with (acute) exacerbation: Secondary | ICD-10-CM | POA: Diagnosis not present

## 2023-04-08 LAB — GLUCOSE, CAPILLARY
Glucose-Capillary: 257 mg/dL — ABNORMAL HIGH (ref 70–99)
Glucose-Capillary: 242 mg/dL — ABNORMAL HIGH (ref 70–99)
Glucose-Capillary: 332 mg/dL — ABNORMAL HIGH (ref 70–99)
Glucose-Capillary: 254 mg/dL — ABNORMAL HIGH (ref 70–99)

## 2023-04-08 LAB — RENAL FUNCTION PANEL
Albumin: 3.6 g/dL (ref 3.5–5.0)
Anion gap: 9 (ref 5–15)
BUN: 16 mg/dL (ref 8–23)
CO2: 25 mmol/L (ref 22–32)
Calcium: 8.9 mg/dL (ref 8.9–10.3)
Chloride: 103 mmol/L (ref 98–111)
Creatinine, Ser: 1.1 mg/dL (ref 0.61–1.24)
GFR, Estimated: >60 mL/min (ref >60)
Glucose, Bld: 153 mg/dL — ABNORMAL HIGH (ref 70–99)
Phosphorus: 4.9 mg/dL — ABNORMAL HIGH (ref 2.5–4.6)
Potassium: 3.8 mmol/L (ref 3.5–5.1)
Sodium: 137 mmol/L (ref 135–145)

## 2023-04-08 LAB — CBC
HCT: 37.3 % — ABNORMAL LOW (ref 39.0–52.0)
Hemoglobin: 10.7 g/dL — ABNORMAL LOW (ref 13.0–17.0)
MCH: 20.7 pg — ABNORMAL LOW (ref 26.0–34.0)
MCHC: 28.7 g/dL — ABNORMAL LOW (ref 30.0–36.0)
MCV: 72.3 fL — ABNORMAL LOW (ref 80.0–100.0)
Platelets: 278 10*3/uL (ref 150–400)
RBC: 5.16 MIL/uL (ref 4.22–5.81)
RDW: 23.8 % — ABNORMAL HIGH (ref 11.5–15.5)
WBC: 6.8 10*3/uL (ref 4.0–10.5)
nRBC: 0 % (ref 0.0–0.2)

## 2023-04-08 MED ORDER — GUAIFENESIN-DM 100-10 MG/5ML PO SYRP
5.0000 mL | ORAL_SOLUTION | ORAL | Status: DC
  Administered 2023-04-08 – 2023-04-09 (×5): 5 mL via ORAL
  Filled 2023-04-08 (×5): qty 10

## 2023-04-08 MED ORDER — ARFORMOTEROL TARTRATE 15 MCG/2ML IN NEBU
15.0000 ug | INHALATION_SOLUTION | Freq: Two times a day (BID) | RESPIRATORY_TRACT | Status: DC
  Administered 2023-04-08 – 2023-04-09 (×2): 15 ug via RESPIRATORY_TRACT
  Filled 2023-04-08 (×2): qty 2

## 2023-04-08 MED ORDER — NICOTINE 7 MG/24HR TD PT24
7.0000 mg | MEDICATED_PATCH | Freq: Every day | TRANSDERMAL | Status: DC
  Administered 2023-04-08 – 2023-04-09 (×2): 7 mg via TRANSDERMAL
  Filled 2023-04-08 (×2): qty 1

## 2023-04-08 MED ORDER — REVEFENACIN 175 MCG/3ML IN SOLN
175.0000 ug | Freq: Every day | RESPIRATORY_TRACT | Status: DC
  Filled 2023-04-08: qty 3

## 2023-04-08 MED ORDER — ARFORMOTEROL TARTRATE 15 MCG/2ML IN NEBU: 15.0000 ug | INHALATION_SOLUTION | Freq: Two times a day (BID) | RESPIRATORY_TRACT | Status: DC

## 2023-04-08 NOTE — Evaluation (Signed)
Physical Therapy Evaluation Patient Details Name: Richard [name] MRN: [id number] DOB: 1960/12/26 Today's Date: 04/08/2023  History of Present Illness  Patient is 62 y.o. male who was admitted for acute hypoxic respiratory failure from a COPD exacerbation with continued paroxysmal cough and significant wheezing. PMH significant for COPD/asthma, hypertension, type 2 diabetes, CAD.   Clinical Impression  Rece Chowning is 62 y.o. male admitted with above HPI and diagnosis. Patient is currently limited by functional impairments below (see PT problem list). Patient lives with son and is independent with no AD for household mobility at baseline. Currently pt requires min guard for safety with gait with no AD, seated rest required after amb ~200' due to DOE, SOB, and mild chest discomfort. VSS with HR in 110's-120's and SpO2 100% on RA. Pt recovered well with seated rest and amb back too room. Pt reaching occasionally for external support and suspect rollator may benefit pt for longer distance ambulation. Patient will benefit from continued skilled PT interventions to address impairments and progress independence with mobility. Acute PT will follow and progress as able.         Assistance Recommended at Discharge Intermittent Supervision/Assistance  If plan is discharge home, recommend the following:  Can travel by private vehicle  A little help with walking and/or transfers;A little help with bathing/dressing/bathroom;Assistance with cooking/housework;Assist for transportation;Help with stairs or ramp for entrance        Equipment Recommendations  (TBD (possibly rollator))  Recommendations for Other Services       Functional Status Assessment Patient has had a recent decline in their functional status and demonstrates the ability to make significant improvements in function in a reasonable and predictable amount of time.     Precautions / Restrictions Precautions Precautions: Fall Precaution  Comments: DOE Restrictions Weight Bearing Restrictions: No      Mobility  Bed Mobility Overal bed mobility: Modified Independent                  Transfers Overall transfer level: Needs assistance Equipment used: None Transfers: Sit to/from Stand Sit to Stand: Min guard, Min assist                Ambulation/Gait Ambulation/Gait assistance: Min guard Gait Distance (Feet): 200 Feet Assistive device: None (200+60)   Gait velocity: decr        Stairs            Wheelchair Mobility     Tilt Bed    Modified Rankin (Stroke Patients Only)       Balance                                             Pertinent Vitals/Pain Pain Assessment Pain Assessment: Faces Faces Pain Scale: Hurts little more Pain Descriptors / Indicators: Discomfort Pain Intervention(s): Limited activity within patient's tolerance, Monitored during session, Repositioned    Home Living Family/patient expects to be discharged to:: Private residence Living Arrangements: Alone;Children (son works too) Available Help at Discharge: Family;Available PRN/intermittently Type of Home: House Home Access: Stairs to enter Entrance Stairs-Rails: Right;Left;Can reach both Entrance Stairs-Number of Steps: 1 at back and 3 at front Alternate Level Stairs-Number of Steps: flight Home Layout: Two level;1/2 bath on main level;Bed/bath upstairs Home Equipment: Cane - single point;BSC/3in1;Grab bars - tub/shower;Wheelchair - manual;Grab bars - toilet;Hand held shower head      Prior Function Prior  Level of Function : Independent/Modified Independent                     Hand Dominance   Dominant Hand: Left    Extremity/Trunk Assessment   Upper Extremity Assessment Upper Extremity Assessment: Overall WFL for tasks assessed    Lower Extremity Assessment Lower Extremity Assessment: Overall WFL for tasks assessed    Cervical / Trunk Assessment Cervical / Trunk  Assessment: Normal  Communication   Communication: No difficulties  Cognition Arousal/Alertness: Awake/alert Behavior During Therapy: WFL for tasks assessed/performed Overall Cognitive Status: Within Functional Limits for tasks assessed                                          General Comments      Exercises     Assessment/Plan    PT Assessment Patient needs continued PT services  PT Problem List Decreased activity tolerance;Decreased balance;Decreased mobility;Decreased knowledge of use of DME;Cardiopulmonary status limiting activity       PT Treatment Interventions DME instruction;Gait training;Stair training;Functional mobility training;Therapeutic activities;Therapeutic exercise;Balance training;Patient/family education    PT Goals (Current goals can be found in the Care Plan section)  Acute Rehab PT Goals Patient Stated Goal: get home PT Goal Formulation: With patient Time For Goal Achievement: 04/22/23 Potential to Achieve Goals: Good    Frequency Min 3X/week     Co-evaluation               AM-PAC PT "6 Clicks" Mobility  Outcome Measure Help needed turning from your back to your side while in a flat bed without using bedrails?: None Help needed moving from lying on your back to sitting on the side of a flat bed without using bedrails?: None Help needed moving to and from a bed to a chair (including a wheelchair)?: A Little Help needed standing up from a chair using your arms (e.g., wheelchair or bedside chair)?: A Little Help needed to walk in hospital room?: A Little Help needed climbing 3-5 steps with a railing? : A Little 6 Click Score: 20    End of Session Equipment Utilized During Treatment: Gait belt Activity Tolerance: Patient tolerated treatment well Patient left: in bed;with call bell/phone within reach Nurse Communication: Mobility status PT Visit Diagnosis: Other abnormalities of gait and mobility (R26.89);Unsteadiness on  feet (R26.81);Difficulty in walking, not elsewhere classified (R26.2)    Time: 1610-9604 PT Time Calculation (min) (ACUTE ONLY): 20 min   Charges:   PT Evaluation $PT Eval Moderate Complexity: 1 Mod   PT General Charges $$ ACUTE PT VISIT: 1 Visit         Wynn Maudlin, DPT Acute Rehabilitation Services Office [phone]  04/08/23 1:38 PM

## 2023-04-08 NOTE — TOC Initial Note (Signed)
Transition of Care [facility]) - Initial/Assessment Note    Patient Details  Name: Richard [name] MRN: [id number] Date of Birth: 04/07/1961  Transition of Care [facility]) CM/SW Contact:    Janae Bridgeman, RN Phone Number: 04/08/2023, 2:35 PM  Clinical Narrative:                 CM met with the patient at the bedside to discuss TOC needs.  The patient was admitted for acute respiratory failure and is currently on RA.  The patient's son lives with him at the apartment to provide care assistance.  DME at the home includes nebulizer machines, glucometer, and cane (only uses when walking long distances).  The patient states that he quit smoking one month ago.  Smoking cessation included in the AVS for the patient.  I provided the patient with Medicare choice regarding home health services and he did not have a preference.  I called [facility] and they accepted for [facility] services for PT.  Home health orders placed to be co-signed by the MD.  No other CM needs at this time.  I will follow up with the patient closer to discharge to see if patient needs a Rolator - to be determined per PT note.  Expected Discharge Plan: Home w Home Health Services Barriers to Discharge: Continued Medical Work up   Patient Goals and CMS Choice Patient states their goals for this hospitalization and ongoing recovery are:: To return home CMS Medicare.gov Compare Post Acute Care list provided to:: Patient Choice offered to / list presented to : Patient      Expected Discharge Plan and Services   Discharge Planning Services: CM Consult Post Acute Care Choice: Home Health Living arrangements for the past 2 months: Apartment                           HH Arranged: PT HH Agency: [facility] Date [facility] Agency Contacted: 04/08/23 Time HH Agency Contacted: [phone] Representative spoke with at [facility] Agency: Kandee Keen, RNCM with [facility] HH accepted for [facility] PT services  Prior Living Arrangements/Services Living  arrangements for the past 2 months: Apartment Lives with:: Self Patient language and need for interpreter reviewed:: Yes Do you feel safe going back to the place where you live?: Yes      Need for Family Participation in Patient Care: Yes (Comment) Care giver support system in place?: Yes (comment) Current home services: DME (Nebulizer, glucometer, Cane) Criminal Activity/Legal Involvement Pertinent to Current Situation/Hospitalization: No - Comment as needed  Activities of Daily Living Home Assistive Devices/Equipment: Cane (specify quad or straight) ADL Screening (condition at time of admission) Patient's cognitive ability adequate to safely complete daily activities?: Yes Is the patient deaf or have difficulty hearing?: No Does the patient have difficulty seeing, even when wearing glasses/contacts?: No Does the patient have difficulty concentrating, remembering, or making decisions?: No Patient able to express need for assistance with ADLs?: No Does the patient have difficulty dressing or bathing?: No Independently performs ADLs?: Yes (appropriate for developmental age) Does the patient have difficulty walking or climbing stairs?: Yes Weakness of Legs: Right Weakness of Arms/Hands: None  Permission Sought/Granted Permission sought to share information with : Case Manager Permission granted to share information with : Yes, Verbal Permission Granted     Permission granted to share info w AGENCY: Home health agency  Permission granted to share info w Relationship: son - to assist with transportation  Emotional Assessment Appearance:: Appears stated age Attitude/Demeanor/Rapport: Gracious Affect (typically observed): Accepting Orientation: : Oriented to Self, Oriented to Place, Oriented to  Time, Oriented to Situation Alcohol / Substance Use: Tobacco Use (Quit smoking 1 month ago) Psych Involvement: No (comment)  Admission diagnosis:  Acute hypoxic respiratory failure (HCC)  [J96.01] Patient Active Problem List   Diagnosis Date Noted   Tobacco use disorder 04/06/2023   Acute hypoxic respiratory failure (HCC) 04/05/2023   Microcytic anemia 03/21/2023   Eczema 11/22/2022   Acute exacerbation of COPD with asthma (HCC) 11/21/2022   Routine adult health maintenance 09/27/2022   Anxiety 09/04/2022   Elevated TSH 08/17/2021   Mild aortic stenosis by prior echocardiogram 03/02/2021   Mild mitral stenosis by prior echocardiogram 03/02/2021   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   Impingement syndrome of right shoulder 01/29/2020   Mediastinal mass 08/25/2018   Osteoarthritis of left knee 01/09/2018   Peripheral arterial disease (HCC) 10/21/2017   Erectile dysfunction associated with type 2 diabetes mellitus (HCC) 03/06/2017   Abdominal aortic atherosclerosis (HCC) 01/18/2017   Vitamin D deficiency 07/02/2016   Tubular adenoma of colon 02/06/2016   Diabetic neuropathy, painful (HCC) 11/30/2015   Overweight (BMI 25.0-29.9) 11/30/2015   CAD in native artery 10/20/2015   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    Essential hypertension    Type 2 diabetes mellitus with peripheral artery disease (HCC)    Asthma-COPD overlap syndrome    PCP:  [name], DO Pharmacy:   [facility] [phone] - [place] Otto, [place] - [address] AT [facility] [address] [place] [address] Phone: [phone] Fax: [phone]     Social Determinants of Health (SDOH) Social History: SDOH Screenings   Food Insecurity: No Food Insecurity (04/05/2023)  Housing: Low Risk  (04/05/2023)  Transportation Needs: No Transportation Needs (04/05/2023)  Utilities: Not At Risk (04/05/2023)  Alcohol Screen: Low Risk  (09/27/2022)  Depression (PHQ2-9): Low Risk  (03/21/2023)  Social Connections: Moderately Isolated (11/28/2022)  Tobacco Use: Medium Risk (04/05/2023)   SDOH Interventions:     Readmission Risk Interventions     No data  to display

## 2023-04-08 NOTE — Progress Notes (Signed)
HD#0 Subjective:   Richard [name] is a 62 y.o. with a pertinent PMH of COPD/asthma, hypertension, type 2 diabetes, CAD, who was admitted for acute hypoxic respiratory failure on 04/05/23 due to a COPD exacerbation with continued paroxysmal cough and significant wheezing.  Overnight Events: NA  Richard [name] was resting comfortably in bed, his girlfriend was at bedside. Slept ok, cough and pain with coughing improved with Robitussin. Denied chest pain. Endorses continued SOB with speaking and feeling lightheaded when he takes deep breaths in. Not wheezing as much as he was on admission, waxes/wanes throughout the day.   Taking eliqus every day, inhaler everyday BID at home: NOT running out of home meds   Felt ok for about 1 week after d/c in May for COPD exacerbation. Hesitant to have O2 at home. Ok with physical and respiratory therapy. Willing to start smoking cessation therapy in outpatient setting. Discussed pursed lip breathing strategy to help with SOB- patient acknowledged and is willing to try today.   Vital signs in last 24 hours: Vitals:   04/08/23 0744 04/08/23 0800 04/08/23 0808 04/08/23 0830  BP: 126/65   (!) 141/76  Pulse: 100  (!) 112 99  Resp: 18  (!) 32 18  Temp: (!) 97.3 F (36.3 C)     TempSrc:      SpO2: 100% 99% 95% 96%  Weight:      Height:       Supplemental O2: Room air SpO2: 96 % O2 Flow Rate (L/min): 10 L/min   Physical Exam:   General: Resting in bed, pausing while speaking to catch his breath CV: Regular rate and rhythm, no murmurs or gallops, capillary refill WNL  Pulm: Mild work of breathing without overt accessory muscle use at rest. No crackles or rales on exam; inspiratory and expiratory wheezing audible with stethoscope Abdominal: BS present, soft abdomen Skin: Warm and dry skin Psych: Alert and oriented, conversing in short sentences    Filed Weights   04/05/23 1919 04/08/23 0607  Weight: 102.5 kg 95.6 kg    No intake or output data  in the 24 hours ending 04/08/23 1142 Net IO Since Admission: 750 mL [04/08/23 1142]  Pertinent Labs:    Latest Ref Rng & Units 04/08/2023    6:34 AM 04/07/2023    3:33 AM 04/05/2023    7:33 PM  CBC  WBC 4.0 - 10.5 K/uL 6.8  7.5    Hemoglobin 13.0 - 17.0 g/dL 40.9  81.1  91.4   Hematocrit 39.0 - 52.0 % 37.3  35.0  40.0   Platelets 150 - 400 K/uL 278  240         Latest Ref Rng & Units 04/08/2023    6:34 AM 04/05/2023    7:33 PM 04/05/2023    7:19 PM  CMP  Glucose 70 - 99 mg/dL 782   956   BUN 8 - 23 mg/dL 16   15   Creatinine 2.13 - 1.24 mg/dL 0.86   5.78   Sodium 469 - 145 mmol/L 137  139  138   Potassium 3.5 - 5.1 mmol/L 3.8  3.9  3.9   Chloride 98 - 111 mmol/L 103   106   CO2 22 - 32 mmol/L 25   22   Calcium 8.9 - 10.3 mg/dL 8.9   8.9     Imaging: No results found.  Assessment/Plan:   Principal Problem:   Acute hypoxic respiratory failure (HCC) Active Problems:   Essential hypertension  Type 2 diabetes mellitus with peripheral artery disease (HCC)   CAD in native artery   Acute exacerbation of COPD with asthma (HCC)   Tobacco use disorder  #1 Acute hypoxic respiratory failure - improved COPD - asthma exacerbation Tobacco use disorder  Richard [name] continues to saturate 95-99% on room air, but continues to have significant wheezing on exam as well as dyspnea while speaking, requiring him to speak with pauses to catch his breath. He reported that his cough and pain associated with cough improved some with Robitussin when he asked for it. Pulse ox with ambulation was assessed this morning, with an O2 saturation of 95% while walking, HR 112, RR 32, then 96% at rest, HR 99, RR 18 (20 minutes later). Patient endorsed taking medications as directed for COPD but is willing to demonstrate the use of an inhaler, also willing to speak with RT and PT about pulmonary rehab. Home O2 is not indicated at this time but patient did comment he does not want to be discharged on supplemental  oxygen. Is willing to discuss smoking cessation at PCP outpatient f/u visit. Continues to benefit from scheduled nebulized treatments, will consult RT and PT for pulmonary rehab.   -Continue DuoNebs scheduled every 4h and budesonide -Prednisone 40 mg x 4 days (6/29-7/2); third dose today 7/1 -Azithromycin 500 mg daily x (6/29/7/1); last dose to be given today 7/1 -Dextromethorphan- scheduled every 4hrs -Resume home montelukast -Smoking cessation discussion outpatient  -Engage RT and PT to determine if there are any pulmonary rehab strategies that patient can benefit from while on IP service as well as on discharge -Review pursed lip breathing method with patient to improve SOB -Have patient model use of inhaler   CAD -Continue aspirin, Plavix and Lipitor  Type 2 diabetes with neuropathy A1c of 7.92 weeks ago, previously 7.09 January 2023 Home regimen: Synjardy, Actos and Trulicity -Continue on empagliflozin, Actos and Lyrica -Continue SSI  IDA Outpatient work up ongoing. Iron level was low at 18, ferritin at 6 and iron saturation at 4 %. Was given IV Iron Sucrose 500 mg once. Hgb trending down to 10.2 6/30. Pt with colonoscopy in 2017 having an adenoma that was removed with recommendation to repeat in 5-10 years, and is overdue. Will need outpatient colonoscopy. Can help arrange when ready for discharge.   Diet: Normal IVF: None, VTE: Enoxaparin Code: Full PT/OT recs: Pending PT recommendations TOC recs: N/A  Dispo: Anticipated discharge to Home in 1 days pending improvement of respiratory status.   Philomena Doheny, MD 04/08/2023, 11:42 AM Pager: [phone]  Please contact the on call pager after 5 pm and on weekends at [phone].

## 2023-04-08 NOTE — Inpatient Diabetes Management (Addendum)
Inpatient Diabetes Program Recommendations  AACE/ADA: New Consensus Statement on Inpatient Glycemic Control (2015)  Target Ranges:  Prepandial:   less than 140 mg/dL      Peak postprandial:   less than 180 mg/dL (1-2 hours)      Critically ill patients:  140 - 180 mg/dL   Lab Results  Component Value Date   GLUCAP 257 (H) 04/08/2023   HGBA1C 7.9 (H) 03/21/2023    Review of Glycemic Control  Latest Reference Range & Units 04/07/23 11:51 04/07/23 16:20 04/07/23 20:33 04/08/23 07:46  Glucose-Capillary 70 - 99 mg/dL 161 (H) 096 (H) 045 (H) 257 (H)  (H): Data is abnormally high Diabetes history: Type 2 DM Outpatient Diabetes medications: Trulicity 4.5 mg qwk, synjardy 12.02-999 mg BID, Actos 30 mg QD Current orders for Inpatient glycemic control: Actos 30 mg every day, Jardiance 25 mg every day, Novolog 0-9 units TID & HS Prednisone 40 mg every day Solumedrol 125 mg x 1  Inpatient Diabetes Program Recommendations:    If to remain inpatient: consider adding Novolog 4 units TID (assuming patient consuming >50% of meals) and carb modified diet if appropriate.  Thanks, Lujean Rave, MSN, RNC-OB Diabetes Coordinator [phone] (8a-5p)

## 2023-04-09 ENCOUNTER — Other Ambulatory Visit (HOSPITAL_COMMUNITY): Payer: Self-pay | Admitting: Psychiatry

## 2023-04-09 ENCOUNTER — Other Ambulatory Visit (HOSPITAL_COMMUNITY): Payer: Self-pay

## 2023-04-09 DIAGNOSIS — F332 Major depressive disorder, recurrent severe without psychotic features: Secondary | ICD-10-CM

## 2023-04-09 DIAGNOSIS — J9601 Acute respiratory failure with hypoxia: Secondary | ICD-10-CM | POA: Diagnosis not present

## 2023-04-09 DIAGNOSIS — F411 Generalized anxiety disorder: Secondary | ICD-10-CM

## 2023-04-09 DIAGNOSIS — E1142 Type 2 diabetes mellitus with diabetic polyneuropathy: Secondary | ICD-10-CM | POA: Diagnosis not present

## 2023-04-09 DIAGNOSIS — Z87891 Personal history of nicotine dependence: Secondary | ICD-10-CM | POA: Diagnosis not present

## 2023-04-09 DIAGNOSIS — J441 Chronic obstructive pulmonary disease with (acute) exacerbation: Secondary | ICD-10-CM | POA: Diagnosis not present

## 2023-04-09 LAB — CBC
HCT: 35.6 % — ABNORMAL LOW (ref 39.0–52.0)
Hemoglobin: 10.2 g/dL — ABNORMAL LOW (ref 13.0–17.0)
MCH: 20.9 pg — ABNORMAL LOW (ref 26.0–34.0)
MCHC: 28.7 g/dL — ABNORMAL LOW (ref 30.0–36.0)
MCV: 73.1 fL — ABNORMAL LOW (ref 80.0–100.0)
Platelets: 238 10*3/uL (ref 150–400)
RBC: 4.87 MIL/uL (ref 4.22–5.81)
RDW: 23.8 % — ABNORMAL HIGH (ref 11.5–15.5)
WBC: 7 10*3/uL (ref 4.0–10.5)
nRBC: 0 % (ref 0.0–0.2)

## 2023-04-09 LAB — GLUCOSE, CAPILLARY
Glucose-Capillary: 171 mg/dL — ABNORMAL HIGH (ref 70–99)
Glucose-Capillary: 398 mg/dL — ABNORMAL HIGH (ref 70–99)

## 2023-04-09 MED ORDER — NICOTINE 7 MG/24HR TD PT24
7.0000 mg | MEDICATED_PATCH | Freq: Every day | TRANSDERMAL | 0 refills | Status: DC
  Filled 2023-04-09: qty 14, 14d supply, fill #0

## 2023-04-09 MED ORDER — PREDNISONE 10 MG PO TABS
ORAL_TABLET | ORAL | 0 refills | Status: AC
  Filled 2023-04-09: qty 7, 6d supply, fill #0

## 2023-04-09 MED ORDER — FEXOFENADINE HCL 60 MG PO TABS
60.0000 mg | ORAL_TABLET | Freq: Two times a day (BID) | ORAL | 0 refills | Status: DC
  Filled 2023-04-09: qty 60, 30d supply, fill #0

## 2023-04-09 MED ORDER — PREDNISONE 10 MG PO TABS
10.0000 mg | ORAL_TABLET | Freq: Every day | ORAL | 0 refills | Status: DC
  Filled 2023-04-09: qty 7, 7d supply, fill #0

## 2023-04-09 MED ORDER — REVEFENACIN 175 MCG/3ML IN SOLN
175.0000 ug | Freq: Every day | RESPIRATORY_TRACT | Status: DC
  Administered 2023-04-09: 175 ug via RESPIRATORY_TRACT
  Filled 2023-04-09: qty 3

## 2023-04-09 MED ORDER — GUAIFENESIN-DM 100-10 MG/5ML PO SYRP
5.0000 mL | ORAL_SOLUTION | ORAL | 0 refills | Status: AC | PRN
  Filled 2023-04-09: qty 237, 8d supply, fill #0

## 2023-04-09 MED ORDER — IPRATROPIUM-ALBUTEROL 0.5-2.5 (3) MG/3ML IN SOLN: 3.0000 mL | Freq: Two times a day (BID) | RESPIRATORY_TRACT | Status: DC

## 2023-04-09 NOTE — Plan of Care (Signed)

## 2023-04-09 NOTE — Progress Notes (Signed)
    Durable Medical Equipment  (From admission, onward)           Start     Ordered   04/09/23 0923  For home use only DME 4 wheeled rolling walker with seat  Once       Question:  Patient needs a walker to treat with the following condition  Answer:  Generalized weakness   04/09/23 0923

## 2023-04-09 NOTE — Progress Notes (Signed)
Physical Therapy Treatment Patient Details Name: Richard [name] MRN: [id number] DOB: [birth year] Today's Date: 04/09/2023   History of Present Illness Patient is 62 y.o. male who was admitted for acute hypoxic respiratory failure from a COPD exacerbation with continued paroxysmal cough and significant wheezing. PMH significant for COPD/asthma, hypertension, type 2 diabetes, CAD.    PT Comments  Pt greeted resting in bed and agreeable to session with focus on gait with rollator support for increased activity tolerance and safety with DME post acutely. Pt progressing gait with supervision for safety without need for seated rest with pt endorsing increased stability. Pt was educated on continued rollator use, especially for community mobility, to maximize functional independence and safety as well as importance of continued mobility and appropriate activity progression. Pt continues to benefit from skilled PT services to progress toward functional mobility goals.      Assistance Recommended at Discharge Intermittent Supervision/Assistance  If plan is discharge home, recommend the following:  Can travel by private vehicle    A little help with walking and/or transfers;A little help with bathing/dressing/bathroom;Assistance with cooking/housework;Assist for transportation;Help with stairs or ramp for entrance      Equipment Recommendations   (TBD (possibly rollator))    Recommendations for Other Services       Precautions / Restrictions Precautions Precautions: Fall Precaution Comments: DOE Restrictions Weight Bearing Restrictions: No     Mobility  Bed Mobility Overal bed mobility: Modified Independent                  Transfers Overall transfer level: Needs assistance Equipment used: Rollator (4 wheels) Transfers: Sit to/from Stand Sit to Stand: Supervision           General transfer comment: for safety    Ambulation/Gait Ambulation/Gait assistance:  Supervision Gait Distance (Feet): 400 Feet Assistive device: Rollator (4 wheels) Gait Pattern/deviations: Trunk flexed, Step-through pattern Gait velocity: slightly decr     General Gait Details: steady gait with rollator, no need for seated rest with DME use, good use of brakes   Stairs             Wheelchair Mobility     Tilt Bed    Modified Rankin (Stroke Patients Only)       Balance                                            Cognition Arousal/Alertness: Awake/alert Behavior During Therapy: WFL for tasks assessed/performed Overall Cognitive Status: Within Functional Limits for tasks assessed                                          Exercises      General Comments General comments (skin integrity, edema, etc.): pt calling son suring session for education on importance of continued activity and detriments of bes rest post acutely      Pertinent Vitals/Pain Pain Assessment Pain Assessment: No/denies pain Pain Intervention(s): Monitored during session    Home Living                          Prior Function            PT Goals (current goals can now be found in the care plan section) Acute Rehab PT  Goals Patient Stated Goal: get home PT Goal Formulation: With patient Time For Goal Achievement: 04/22/23 Progress towards PT goals: Progressing toward goals    Frequency    Min 3X/week      PT Plan      Co-evaluation              AM-PAC PT "6 Clicks" Mobility   Outcome Measure  Help needed turning from your back to your side while in a flat bed without using bedrails?: None Help needed moving from lying on your back to sitting on the side of a flat bed without using bedrails?: None Help needed moving to and from a bed to a chair (including a wheelchair)?: A Little Help needed standing up from a chair using your arms (e.g., wheelchair or bedside chair)?: A Little Help needed to walk in  hospital room?: A Little Help needed climbing 3-5 steps with a railing? : A Little 6 Click Score: 20    End of Session Equipment Utilized During Treatment: Gait belt Activity Tolerance: Patient tolerated treatment well Patient left: in bed;with call bell/phone within reach Nurse Communication: Mobility status PT Visit Diagnosis: Other abnormalities of gait and mobility (R26.89);Unsteadiness on feet (R26.81);Difficulty in walking, not elsewhere classified (R26.2)     Time: 1027-1040 PT Time Calculation (min) (ACUTE ONLY): 13 min  Charges:    $Gait Training: 8-22 mins PT General Charges $$ ACUTE PT VISIT: 1 Visit                     Danton Palmateer R. PTA Acute Rehabilitation Services Office: [phone]   Catalina Antigua 04/09/2023, 11:42 AM

## 2023-04-09 NOTE — Inpatient Diabetes Management (Signed)
Inpatient Diabetes Program Recommendations  AACE/ADA: New Consensus Statement on Inpatient Glycemic Control (2015)  Target Ranges:  Prepandial:   less than 140 mg/dL      Peak postprandial:   less than 180 mg/dL (1-2 hours)      Critically ill patients:  140 - 180 mg/dL   Lab Results  Component Value Date   GLUCAP 171 (H) 04/09/2023   HGBA1C 7.9 (H) 03/21/2023    Review of Glycemic Control  Latest Reference Range & Units 04/08/23 12:10 04/08/23 16:56 04/08/23 20:59 04/09/23 06:58  Glucose-Capillary 70 - 99 mg/dL 161 (H) 096 (H) 045 (H) 171 (H)  (H): Data is abnormally high Diabetes history: Type 2 Dm Outpatient Diabetes medications: Synjardy 12.02-999 mg BID, Trulicity 4.5 mg qwk, Actos 30 mg QD Current orders for Inpatient glycemic control: Actos 30 mg every day, Jardiance 25 mg every day, Novolog 0-9 units TID & HS Solumedrol 125 mg x 1, Prednisone 40 mg QD  Inpatient Diabetes Program Recommendations:    If to remain inpatient: Consider: -Semglee 12 units every day -Add Novolog 3 units TID (assuming patient is consuming >50% of meals) - carb modified diet  Thanks, Lujean Rave, MSN, RNC-OB Diabetes Coordinator [phone] (8a-5p)

## 2023-04-09 NOTE — TOC Progression Note (Signed)
Transition of Care [facility]) - Progression Note    Patient Details  Name: Richard [name] MRN: [id number] Date of Birth: Sep 05, 1961  [facility] CM/SW Contact  Janae Bridgeman, RN Phone Number: 04/09/2023, 10:21 AM  Clinical Narrative:    CM met with the patient at the bedside to discuss need for Rolator.  I called [employer] and asked that it be delivered to the hospital room today prior to discharge.  DME order and progress note placed to be co-signed by the physician.    Bedside nursing to provide discharge instructions once rolator is delivered to the hospital room.  [facility] will be providing PT.   Expected Discharge Plan: Home w Home Health Services Barriers to Discharge: Continued Medical Work up  Expected Discharge Plan and Services   Discharge Planning Services: CM Consult Post Acute Care Choice: Home Health Living arrangements for the past 2 months: Apartment                           HH Arranged: PT HH Agency: [facility] Date [facility] Agency Contacted: 04/08/23 Time HH Agency Contacted: [phone] Representative spoke with at [facility] Agency: Kandee Keen, RNCM with [facility] HH accepted for [facility] PT services   Social Determinants of Health (SDOH) Interventions SDOH Screenings   Food Insecurity: No Food Insecurity (04/05/2023)  Housing: Low Risk  (04/05/2023)  Transportation Needs: No Transportation Needs (04/05/2023)  Utilities: Not At Risk (04/05/2023)  Alcohol Screen: Low Risk  (09/27/2022)  Depression (PHQ2-9): Low Risk  (03/21/2023)  Social Connections: Moderately Isolated (11/28/2022)  Tobacco Use: Medium Risk (04/05/2023)    Readmission Risk Interventions     No data to display

## 2023-04-09 NOTE — Discharge Summary (Signed)
Name: Richard [name] MRN: [id number] DOB: [birth year] 62 y.o. PCP: [name], DO  Date of Admission: 04/05/2023  7:17 PM Date of Discharge: No discharge date for patient encounter. Attending Physician: Inez Catalina, MD  Discharge Diagnosis: 1. 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 What changed: Another medication with the same name was changed. Make sure you understand how and when to take each.   albuterol 108 (90 Base) MCG/ACT inhaler Commonly known as: VENTOLIN HFA Inhale 1-2 puffs into the lungs every 4 (four) hours as needed for wheezing or shortness of breath. What changed: how much to take   aspirin EC 81 MG tablet Take 1 tablet (81 mg total) by mouth daily.   atorvastatin 40 MG tablet Commonly known as: LIPITOR Take 1 tablet (40 mg total) by mouth daily.   Breztri Aerosphere 160-9-4.8 MCG/ACT Aero Generic drug: Budeson-Glycopyrrol-Formoterol Inhale 2 puffs into the lungs in the morning and at bedtime.   clopidogrel 75 MG tablet Commonly known as: Plavix Take 1 tablet (75 mg total) by mouth daily.   Dextromethorphan-guaiFENesin 10-100 MG/5ML liquid Take 5 mLs by mouth every 4 (four) hours as needed for up to 5 days for cough.   fexofenadine 60 MG  tablet Commonly known as: ALLEGRA Take 1 tablet (60 mg total) by mouth 2 (two) times daily.   FLUoxetine 10 MG capsule Commonly known as: PROZAC Take 1 capsule (10 mg total) by mouth daily.   fluticasone 50 MCG/ACT nasal spray Commonly known as: FLONASE Place 1 spray into both nostrils daily.   montelukast 10 MG tablet Commonly known as: SINGULAIR Take 1 tablet (10 mg total) by mouth at bedtime.   naproxen 500 MG tablet Commonly known as: NAPROSYN Take 500 mg by mouth every 12 (twelve) hours as needed for moderate pain.   nicotine 7 mg/24hr patch Commonly known as: NICODERM CQ - dosed in mg/24 hr Place 1 patch (7 mg total) onto the skin daily for 14 days. Start taking on: April 10, 2023   Nucala 100 MG/ML Soaj Generic drug: Mepolizumab Inject 1 mL (100 mg total) into the skin every 28 (twenty-eight) days.   pioglitazone 30 MG tablet Commonly known as: ACTOS Take 1 tablet (30 mg total) by mouth daily.   predniSONE 10 MG tablet Commonly known as: DELTASONE Take 1 tablet (10 mg total) by mouth daily for 7 doses. Start taking on: April 10, 2023   pregabalin 100 MG capsule Commonly known as: LYRICA Take 1 capsule (100 mg total) by mouth 3 (three) times daily.   sildenafil 100 MG tablet Commonly known as: Viagra Take 1 tablet (100 mg total) by mouth as needed for erectile dysfunction.   Synjardy 12.02-999 MG Tabs  Generic drug: Empagliflozin-metFORMIN HCl Take 1 tablet by mouth 2 (two) times daily. What changed: when to take this   triamcinolone ointment 0.1 % Commonly known as: KENALOG Apply 1 Application topically 2 (two) times daily.   Trulicity 4.5 MG/0.5ML Sopn Generic drug: Dulaglutide Inject 4.5 mg as directed once a week.               Durable Medical Equipment  (From admission, onward)           Start     Ordered   04/09/23 0923  For home use only DME 4 wheeled rolling walker with seat  Once       Question:  Patient needs a walker to treat  with the following condition  Answer:  Generalized weakness   04/09/23 0923            Disposition and follow-up:   Mr.Waleed Testani was discharged from [facility] in Stable condition. At the hospital follow up visit please address:  COPD/asthma exacerbation COPD Eosinophilic asthma Please ensure that patient has completed Prednisone taper (7/03-7/08). Assess wheezing and whether it continues to improve on nebulized treatments. Patient sees a pulmonologist- please ensure there is follow-up Felisa Bonier, MD, appointment scheduled for 05/27/2023, last seen May 2024) and discussion about possible allergy testing. Patient reported he has never had patch testing performed before and is only aware of some allergy triggers.   Tobacco use disorder Patient seemed interested in smoking cessation, please re-engage in a smoking cessation conversation. He was given some Nicotine patches for cravings, and we mentioned Varenicline/Chantix. Patient willing to learn more about medication.   2.  Labs / imaging needed at time of follow-up:  CBC: Hb 10.2, there is an ongoing outpatient work up for IDA. Iron level low at 18, ferritin of 6, and iron saturation of 4%.  Fasting glucose: Glucose levels elevated during stay, likely due to steroids. Please check to see that home regimen for glucose management continues to be appropriate.   3.  Pending labs/ test needing follow-up:  None  Follow-up Appointments:  Follow-up Information     [name], DO. Schedule an appointment as soon as possible for a visit.   Specialty: Internal Medicine Why: Please call the office and schedule a hospital follow up in the next 7-10 days. Contact information: [address]  [place] [address] [phone]         Care, [facility] Follow up.   Specialty: Home Health Services Why: [facility] will providing health health services for PT.  They will call you in the next  24-48 hours to set up services. Contact information: [address] [place] [address] [phone]         Care, [facility] Follow up.   Why: [employer] will be providing you with a Rolator before you are discharged home from the hospital. Contact information: [address] [place] [address] [phone]                 Hospital Course by problem list:  Acute hypoxic respiratory failure COPD exacerbation Hx COPD-Asthma overlap Tobacco use disorder Presented to the hospital on 6/28 with severe dyspnea, admitted for COPD exacerbation in the setting of dyspnea, increased cough and sputum production. Etiology thought to be environmental triggers vs history of smoking. Patient presented with recurrence of cough paroxysms complicating his dyspnea and severe wheezing. ACS was ruled out and PE Wells was only 1.5.  No new consolidation on CXR. Patient improved with initial IV solumedrol in the ED. He was started on prednisone to complete a 5-day course (completed 7/2) and on empiric antibiotic treatment with azithromycin for 3 days (completed 7/1). Patient's bronchodilator therapy was scheduled with improvement in diffuse wheezing. Home asthma medications were nebulized, with further improvement to wheezing and dyspnea. Pharmacy assisted in educating patient regarding correct inhaler use. PT/OT evaluation determined home health needs, which were coordinated by the transition of care team. He is now stable for discharge to home. He will complete a prednisone taper on 7/8 for 11 days total of steroid treatment. He will also be discharged with Fexofenatidine/Allegra to help mitigate allergen triggers and Guiaifenesin/Robitussion to help with cough paroxysms. Reviewed pursed lip breathing strategy to help with wheezing. Additionally, we counseled patient on tobacco cessation and he began using Nicotine patches to help with cravings during his stay. He is interested in  learning more about medications that can help with smoking cessation.  CAD PAD Patient followed by cardiology and vascular services . During his stay, he was continued on his PTA statin, plavix, and aspirin.  Type 2 diabetes mellitus with neuropathy Patient was continued on empagliflozin and SSI during admission. He was discharged on Trulicity, synjardy, and Actos at discharge. Patients' fasting and mealtime glucose checks were elevated throughout stay, likely due to steroid treatment.   Iron deficiency anemia Currently being worked up for IDA outpatient. Most recent ferritin 6 and iron saturation at 4%. Mr. Wegleitner received 500 mg x1 of iron sucrose. Hgb maintained above 10 during this admission. He will need repeat colonoscopy.  Discharge Exam:   BP 125/78 (BP Location: Left Arm)   Pulse 99   Temp 98.5 F (36.9 C)   Resp 18   Ht 6\' 3"  (1.905 m)   Wt 95.6 kg   SpO2 100%   BMI 26.34 kg/m   Physical exam: General: Resting in bed in no acute distress, able to ask and answer questions appropriately without wheezing or shortness of breath  CV: RRR, no rubs, murmurs, or gallops Pulmonary/Chest: Clear lungs bilaterally, wheezing not audible on auscultation  Abdominal: Soft, non-tender Neurological: Alert and oriented to self, place, and situation  Skin: Warm and dry   Pertinent Labs, Studies, and Procedures:     Latest Ref Rng & Units 04/09/2023    4:21 AM 04/08/2023    6:34 AM 04/07/2023    3:33 AM  CBC  WBC 4.0 - 10.5 K/uL 7.0  6.8  7.5   Hemoglobin 13.0 - 17.0 g/dL 16.1  09.6  04.5   Hematocrit 39.0 - 52.0 % 35.6  37.3  35.0   Platelets 150 - 400 K/uL 238  278  240       Latest Ref Rng & Units 04/08/2023    6:34 AM 04/05/2023    7:33 PM 04/05/2023    7:19 PM  BMP  Glucose 70 - 99 mg/dL 409   811   BUN 8 - 23 mg/dL 16   15   Creatinine 9.14 - 1.24 mg/dL 7.82   9.56   Sodium 213 - 145 mmol/L 137  139  138   Potassium 3.5 - 5.1 mmol/L 3.8  3.9  3.9   Chloride 98 - 111 mmol/L  103   106   CO2 22 - 32 mmol/L 25   22   Calcium 8.9 - 10.3 mg/dL 8.9   8.9      Discharge Instructions: Discharge Instructions  Call MD for:  difficulty breathing, headache or visual disturbances   Complete by: As directed    Call MD for:  extreme fatigue   Complete by: As directed    Call MD for:  hives   Complete by: As directed    Call MD for:  persistant dizziness or light-headedness   Complete by: As directed    Call MD for:  persistant nausea and vomiting   Complete by: As directed    Call MD for:  redness, tenderness, or signs of infection (pain, swelling, redness, odor or green/yellow discharge around incision site)   Complete by: As directed    Call MD for:  severe uncontrolled pain   Complete by: As directed    Call MD for:  temperature >100.4   Complete by: As directed    Discharge instructions   Complete by: As directed    Mr. Kichline -  You were admitted for COPD exacerbation. We think this is due to your known triggers like allergens in the environment and history of smoking. You were treated with antibiotics, breathing treatments, cough medication, and steroids for inflammation. Your wheezing has improved and now you are stable for discharge with the following recommendations  MEDICATIONS New Prednisone: 7/3 - Take 2 pills (total 20 mg)  7/4 - Take 2 pill (total 20 mg) 7/5 - Take 1 pilil (total 10 mg) 7/6 - Take 1 pilil (total 10 mg) 7/7 - break the remaining pill, and take one half (5 mg total) 7/8 - take the remaining one half (5 mg total)  Allegra - take one pill every 12 hours for allergies Robitussin - You can take 5 mL can use this medication every 4-6 hours as needed for cough Nicotine patches -  we are supplying you with a small number (14) of 7 mg nicotine patches to help with cravings. It is important that you attempt smoking cessation to help you get out of the hospital.   Get free tobacco cessation help 24/7 in several ways: 1-800-QUIT-NOW  (617)629-1580); they can provide you with other patches and information.   Other medications - you can continue as you had been taking  APPOINTMENTS:  Please follow up with Dr. Mikey Bussing at the [facility] Medicine [facility] on: 04/18/2023 at 8:45AM   You will also see your pulmonologist on 05/27/2023 at 8:30 - it would be good for you to discuss allergy testing to identify other potential triggers to avoid other COPD-Asthma exacerbations.  We reviewed "pursed lip breathing" strategies to help with wheezing, as well as the accurate use of an inhaler together. physical therapy evaluated you and recommend you continue working on strengthening exercises from home and the use of assistive devices for safe mobility. You should be receiving a phone call from them to help your with set up.   It was a pleasure caring for you,  Drs. Arellano and [facility]   Increase activity slowly   Complete by: As directed        Signed: Philomena Doheny, MD 04/09/2023, 12:47 PM   Pager: @[email or web address] @

## 2023-04-10 ENCOUNTER — Telehealth: Payer: Self-pay

## 2023-04-10 NOTE — Transitions of Care (Post Inpatient/ED Visit) (Signed)
04/10/2023  Name: Richard [name] MRN: [id number] DOB: 29-Dec-1960  Today's TOC FU Call Status: Today's TOC FU Call Status:: Successful TOC FU Call Competed TOC FU Call Complete Date: 04/10/23  Transition Care Management Follow-up Telephone Call Date of Discharge: 04/18/23 Discharge Facility[facility]) Type of Discharge: Inpatient Admission Primary Inpatient Discharge Diagnosis:: COPD How have you been since you were released from the hospital?: Better Any questions or concerns?: No  Items Reviewed: Did you receive and understand the discharge instructions provided?: Yes Medications obtained,verified, and reconciled?: Yes (Medications Reviewed) Any new allergies since your discharge?: No Dietary orders reviewed?: NA Do you have support at home?: Yes People in Home: child(ren), adult  Medications Reviewed Today: Medications Reviewed Today     Reviewed by Karena Addison, LPN (Licensed Practical Nurse) on 04/10/23 at 1009  Med List Status: <None>   Medication Order Taking? Sig Documenting Provider Last Dose Status Informant  albuterol (PROVENTIL) (2.5 MG/3ML) 0.083% nebulizer solution [id number] No Take 2.5 mg by nebulization 2 (two) times daily. [provider] 04/05/2023 Active Self, Pharmacy Records  albuterol (VENTOLIN HFA) 108 (90 Base) MCG/ACT inhaler [id number] No Inhale 1-2 puffs into the lungs every 4 (four) hours as needed for wheezing or shortness of breath.  Patient taking differently: Inhale 2 puffs into the lungs every 4 (four) hours as needed for wheezing or shortness of breath.   Tyson Alias, MD 04/05/2023 Active Self, Pharmacy Records  aspirin EC 81 MG EC tablet [id number] No Take 1 tablet (81 mg total) by mouth daily. [name], MD 04/05/2023 Active Self, Pharmacy Records  atorvastatin (LIPITOR) 40 MG tablet [id number] No Take 1 tablet (40 mg total) by mouth daily. Marolyn Haller, MD Past Week Active Self, Pharmacy Records   Budeson-Glycopyrrol-Formoterol [facility] AEROSPHERE) 160-9-4.8 MCG/ACT [name] [id number] No Inhale 2 puffs into the lungs in the morning and at bedtime. [name], MD Past Week Active Self, Pharmacy Records  clopidogrel (PLAVIX) 75 MG tablet [id number] No Take 1 tablet (75 mg total) by mouth daily. Alene Mires, NP Past Week Active Self, Pharmacy Records           Med Note Epimenio Sarin, Carlota Raspberry Apr 08, 2023  2:05 PM) LF 02/24 for 90 DS. Pt is adamant he is taking this medication daily. Dispense report does not support this claim.   Dulaglutide (TRULICITY) 4.5 MG/0.5ML SOPN [id number] No Inject 4.5 mg as directed once a week. Adron Bene, MD Past Month Active Self, Pharmacy Records  Empagliflozin-metFORMIN HCl [facility]) 12.02-999 MG TABS [id number] No Take 1 tablet by mouth 2 (two) times daily.  Patient taking differently: Take 1 tablet by mouth in the morning and at bedtime.   Marolyn Haller, MD Past Week Active Self, Pharmacy Records           Med Note Epimenio Sarin, Carlota Raspberry Apr 08, 2023  2:06 PM) LF 01/24 for 90 DS. Pt is adamant he is taking this medication BID. Dispense report does not support this claim.   fexofenadine (ALLEGRA) 60 MG tablet [id number]  Take 1 tablet (60 mg total) by mouth 2 (two) times daily. Morene Crocker, MD  Active   FLUoxetine (PROZAC) 10 MG capsule [id number] No Take 1 capsule (10 mg total) by mouth daily.  Patient not taking: Reported on 04/08/2023   Stasia Cavalier, MD Not Taking Active Self, Pharmacy Records  fluticasone [facility]) 50 MCG/ACT nasal spray [id number] No Place 1 spray into both nostrils daily. Carlynn Purl  C, DO Past Week Active Self, Pharmacy Records  guaiFENesin-dextromethorphan [facility] DM) 100-10 MG/5ML syrup [id number]  Take 5 mLs by mouth every 4 (four) hours as needed for up to 5 days for cough. Morene Crocker, MD  Active   Mepolizumab (NUCALA) 100 MG/ML Ivory Broad [id number] No Inject 1 mL (100 mg total) into the skin  every 28 (twenty-eight) days. [name], MD 03/21/2023 Active Self, Pharmacy Records  montelukast (SINGULAIR) 10 MG tablet [id number] No Take 1 tablet (10 mg total) by mouth at bedtime. [name], DO Past Week Active Self, Pharmacy Records           Med Note Epimenio Sarin, Carlota Raspberry Apr 08, 2023  2:07 PM) LF 03/24 for 90 DS. Pt is adamant he is taking this medication daily.   naproxen (NAPROSYN) 500 MG tablet [id number] No Take 500 mg by mouth every 12 (twelve) hours as needed for moderate pain. [provider] Past Week Active Self, Pharmacy Records  nicotine (NICODERM CQ - DOSED IN MG/24 HR) 7 mg/24hr patch [id number]  Place 1 patch (7 mg total) onto the skin daily for 14 days. Morene Crocker, MD  Active   pioglitazone (ACTOS) 30 MG tablet [id number] No Take 1 tablet (30 mg total) by mouth daily. [name], DO Past Week Active Self, Pharmacy Records  predniSONE (DELTASONE) 10 MG tablet [id number]  Take 2 tablets (20 mg total) by mouth daily for 2 days, THEN 1 tablet (10 mg total) daily for 2 days, THEN 0.5 tablets (5 mg total) daily for 2 days. Philomena Doheny, MD  Active   pregabalin (LYRICA) 100 MG capsule [id number] No Take 1 capsule (100 mg total) by mouth 3 (three) times daily. [name], DO Past Week Active Self, Pharmacy Records  sildenafil (VIAGRA) 100 MG tablet [id number] No Take 1 tablet (100 mg total) by mouth as needed for erectile dysfunction. [name], DO Past Month Active Self, Pharmacy Records  triamcinolone ointment (KENALOG) 0.1 % [id number] No Apply 1 Application topically 2 (two) times daily.  Patient not taking: Reported on 04/08/2023   [name], DO Not Taking Active Self, Pharmacy Records            Home Care and Equipment/Supplies: Were Home Health Services Ordered?: Yes Name of Home Health Agency:: unknown Any new equipment or medical supplies ordered?: NA  Functional Questionnaire: Do you need assistance with  bathing/showering or dressing?: No Do you need assistance with meal preparation?: No Do you need assistance with eating?: No Do you have difficulty maintaining continence: No Do you need assistance with getting out of bed/getting out of a chair/moving?: No Do you have difficulty managing or taking your medications?: No  Follow up appointments reviewed: PCP Follow-up appointment confirmed?: Yes Date of PCP follow-up appointment?: 04/18/23 Follow-up Provider: [facility] Follow-up appointment confirmed?: No Reason Specialist Follow-Up Not Confirmed: Patient has Specialist Provider Number and will Call for Appointment Do you need transportation to your follow-up appointment?: No Do you understand care options if your condition(s) worsen?: Yes-patient verbalized understanding    SIGNATURE Karena Addison, LPN [facility] Nurse Health Advisor Direct Dial [phone]

## 2023-04-18 ENCOUNTER — Encounter: Payer: Self-pay | Admitting: Internal Medicine

## 2023-04-18 ENCOUNTER — Telehealth: Payer: Self-pay | Admitting: Dietician

## 2023-04-18 ENCOUNTER — Ambulatory Visit (INDEPENDENT_AMBULATORY_CARE_PROVIDER_SITE_OTHER): Payer: 59 | Admitting: Internal Medicine

## 2023-04-18 ENCOUNTER — Other Ambulatory Visit: Payer: Self-pay

## 2023-04-18 VITALS — BP 114/64 | HR 92 | Temp 98.2°F | Ht 75.0 in | Wt 223.6 lb

## 2023-04-18 DIAGNOSIS — D509 Iron deficiency anemia, unspecified: Secondary | ICD-10-CM | POA: Diagnosis not present

## 2023-04-18 DIAGNOSIS — Z794 Long term (current) use of insulin: Secondary | ICD-10-CM

## 2023-04-18 DIAGNOSIS — E1151 Type 2 diabetes mellitus with diabetic peripheral angiopathy without gangrene: Secondary | ICD-10-CM

## 2023-04-18 DIAGNOSIS — J4489 Other specified chronic obstructive pulmonary disease: Secondary | ICD-10-CM

## 2023-04-18 DIAGNOSIS — I10 Essential (primary) hypertension: Secondary | ICD-10-CM | POA: Diagnosis not present

## 2023-04-18 DIAGNOSIS — F17200 Nicotine dependence, unspecified, uncomplicated: Secondary | ICD-10-CM

## 2023-04-18 DIAGNOSIS — Z87891 Personal history of nicotine dependence: Secondary | ICD-10-CM

## 2023-04-18 MED ORDER — NICOTINE 7 MG/24HR TD PT24: 7.0000 mg | MEDICATED_PATCH | Freq: Every day | TRANSDERMAL | 0 refills | Status: AC

## 2023-04-18 MED ORDER — ALBUTEROL SULFATE (2.5 MG/3ML) 0.083% IN NEBU: 2.5000 mg | INHALATION_SOLUTION | Freq: Two times a day (BID) | RESPIRATORY_TRACT | 5 refills | Status: DC

## 2023-04-18 MED ORDER — MELOXICAM 15 MG PO TABS: 15.0000 mg | ORAL_TABLET | Freq: Every day | ORAL | 2 refills | Status: DC | PRN

## 2023-04-18 NOTE — Patient Instructions (Addendum)
The number for you pulmonologist is [phone]  I am placing a referral for you to see the eye doctor and the gastroenterologist.

## 2023-04-18 NOTE — Telephone Encounter (Signed)
Call to [facility] [phone]. 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 Allyne Gee was treating His appointment is scheduled for ; Tues 7/23 at 12:40 PM at [facility].

## 2023-04-19 LAB — IRON,TIBC AND FERRITIN PANEL
Ferritin: 30 ng/mL (ref 30–400)
Iron Saturation: 14 % — ABNORMAL LOW (ref 15–55)
Iron: 56 ug/dL (ref 38–169)
Total Iron Binding Capacity: 392 ug/dL (ref 250–450)
UIBC: 336 ug/dL (ref 111–343)

## 2023-04-19 LAB — CBC
Hematocrit: 36.8 % — ABNORMAL LOW (ref 37.5–51.0)
Hemoglobin: 11.1 g/dL — ABNORMAL LOW (ref 13.0–17.7)
MCH: 21.8 pg — ABNORMAL LOW (ref 26.6–33.0)
MCHC: 30.2 g/dL — ABNORMAL LOW (ref 31.5–35.7)
MCV: 72 fL — ABNORMAL LOW (ref 79–97)
Platelets: 327 10*3/uL (ref 150–450)
RBC: 5.09 x10E6/uL (ref 4.14–5.80)
RDW: 22.2 % — ABNORMAL HIGH (ref 11.6–15.4)
WBC: 5.1 10*3/uL (ref 3.4–10.8)

## 2023-04-19 LAB — BMP8+ANION GAP
Anion Gap: 14 mmol/L (ref 10.0–18.0)
BUN/Creatinine Ratio: 15 (ref 10–24)
BUN: 14 mg/dL (ref 8–27)
CO2: 22 mmol/L (ref 20–29)
Calcium: 9.4 mg/dL (ref 8.6–10.2)
Chloride: 101 mmol/L (ref 96–106)
Creatinine, Ser: 0.92 mg/dL (ref 0.76–1.27)
Glucose: 124 mg/dL — ABNORMAL HIGH (ref 70–99)
Potassium: 4.9 mmol/L (ref 3.5–5.2)
Sodium: 137 mmol/L (ref 134–144)
eGFR: 93 mL/min/{1.73_m2} (ref >59)

## 2023-04-19 NOTE — Assessment & Plan Note (Signed)
Blood pressure is well-controlled today at 114/64.

## 2023-04-19 NOTE — Progress Notes (Addendum)
Established Patient Office Visit  Subjective   Patient ID: Richard [name], male    DOB: 1961/02/19  Age: 62 y.o. MRN: [id number]  Chief Complaint  Patient presents with   Hospital F/U Visit   Right knee pain   Richard [name] comes in today for hospital follow-up.  He was admitted for COPD exacerbation on 6/28 and discharged on 7/2.  He was thought to have a COPD/asthma exacerbation secondary to allergens and smoking.  Fortunately he has stopped smoking since his hospitalization he has completed his prednisone course and overall feels like his breathing has done much better.  His right knee has been bothering him he was following up with orthopedics prior to this hospitalization and plans to have a knee replacement.  For his COPD he follows with Dr. Daphane [name] of pulmonology, a few years ago he was following with allergy but he is no longer seeing them regularly.  He did have allergy testing several years ago and was allergic to multiple allergan.   He was recently noted to have iron deficiency anemia he underwent an iron infusion prior to his hospitalization.  His last colonoscopy was in 2017 with plans for repeat screening colonoscopy in 5 to 10 years  I have completed a post discharge medication reconciliation.  He just completed his prednisone course.  Atarax and trazodone have been discontinued.  He was prescribed nicotine patches but never filled the prescription, wants to go get it incase he feels he needs it, has not smoked since hospitalized.    Objective:     BP 114/64 (BP Location: Right Arm, Patient Position: Sitting, Cuff Size: Large)   Pulse 92   Temp 98.2 F (36.8 C) (Oral)   Ht 6\' 3"  (1.905 m)   Wt 223 lb 9.6 oz (101.4 kg)   SpO2 100% Comment: RA  BMI 27.95 kg/m  BP Readings from Last 3 Encounters:  04/18/23 114/64  04/09/23 125/78  03/28/23 (!) 145/77   Wt Readings from Last 3 Encounters:  04/18/23 223 lb 9.6 oz (101.4 kg)  04/08/23 210 lb 12.2 oz (95.6 kg)  03/28/23 226  lb (102.5 kg)      Physical Exam Cardiovascular:     Rate and Rhythm: Normal rate and regular rhythm.     Heart sounds: Murmur (2/6 systolic) heard.  Pulmonary:     Effort: No respiratory distress.     Breath sounds: No wheezing.  Musculoskeletal:     Right knee: Effusion and crepitus present. Tenderness present over the medial joint line and lateral joint line. No LCL laxity, MCL laxity, ACL laxity or PCL laxity.     Left knee: No effusion or crepitus. No medial joint line or lateral joint line tenderness. No LCL laxity, MCL laxity, ACL laxity or PCL laxity. Psychiatric:        Mood and Affect: Mood normal.        Behavior: Behavior normal.      Results for orders placed or performed in visit on 04/18/23  CBC no Diff  Result Value Ref Range   WBC 5.1 3.4 - 10.8 x10E3/uL   RBC 5.09 4.14 - 5.80 x10E6/uL   Hemoglobin 11.1 (L) 13.0 - 17.7 g/dL   Hematocrit 78.2 (L) 95.6 - 51.0 %   MCV 72 (L) 79 - 97 fL   MCH 21.8 (L) 26.6 - 33.0 pg   MCHC 30.2 (L) 31.5 - 35.7 g/dL   RDW 21.3 (H) 08.6 - 57.8 %   Platelets 327 150 - 450 x10E3/uL  Iron, TIBC and Ferritin Panel  Result Value Ref Range   Total Iron Binding Capacity 392 250 - 450 ug/dL   UIBC 010 272 - 536 ug/dL   Iron 56 38 - 644 ug/dL   Iron Saturation 14 (L) 15 - 55 %   Ferritin 30 30 - 400 ng/mL  BMP8+Anion Gap  Result Value Ref Range   Glucose 124 (H) 70 - 99 mg/dL   BUN 14 8 - 27 mg/dL   Creatinine, Ser 0.34 0.76 - 1.27 mg/dL   eGFR 93 >74 QV/ZDG/3.87   BUN/Creatinine Ratio 15 10 - 24   Sodium 137 134 - 144 mmol/L   Potassium 4.9 3.5 - 5.2 mmol/L   Chloride 101 96 - 106 mmol/L   CO2 22 20 - 29 mmol/L   Anion Gap 14.0 10.0 - 18.0 mmol/L   Calcium 9.4 8.6 - 10.2 mg/dL    Last CBC Lab Results  Component Value Date   WBC 5.1 04/18/2023   HGB 11.1 (L) 04/18/2023   HCT 36.8 (L) 04/18/2023   MCV 72 (L) 04/18/2023   MCH 21.8 (L) 04/18/2023   RDW 22.2 (H) 04/18/2023   PLT 327 04/18/2023   Last hemoglobin A1c Lab  Results  Component Value Date   HGBA1C 7.9 (H) 03/21/2023      The 10-year ASCVD risk score (Arnett DK, et al., 2019) is: 12%    Assessment & Plan:   Problem List Items Addressed This Visit       Cardiovascular and Mediastinum   Type 2 diabetes mellitus with peripheral artery disease (HCC) (Chronic)    We reviewed his medications he is taking all of them as directed.  His sugars were high post discharge in the 300s he suspects is due to the prednisone more recently he had sugar in the 150s this morning.  Will want to keep A1c less than 7.5 especially given need for upcoming surgery      Relevant Orders   BMP8+Anion Gap (Completed)   Ambulatory referral to Ophthalmology   Essential hypertension (Chronic)    Blood pressure is well-controlled today at 114/64.        Respiratory   Asthma-COPD overlap syndrome (Chronic)    He is doing well today I stressed him the importance of smoking cessation given his COPD and asthma dual diagnosis.  He is on triple combination inhaler as well as multiple allergen medications including Singulair and Nucala.  He has follow-up scheduled with pulmonology.      Relevant Medications   albuterol (PROVENTIL) (2.5 MG/3ML) 0.083% nebulizer solution   nicotine (NICODERM CQ - DOSED IN MG/24 HR) 7 mg/24hr patch     Other   Tobacco use disorder    Congratulated on his success provided prescription for 7 mg patches to use if needed.      Iron deficiency anemia - Primary    Will repeat CBC and iron studies today to see if he needs additional IV iron replacement.  I went to go ahead and refer him back to gastroenterology as he will likely need a diagnostic colonoscopy at this point for evaluation of his iron deficiency anemia.  ADDENDUM: Iron Tsat improved to 14%.  Will reorder anotehr 500mg  venofer injection from short stay.      Relevant Orders   CBC no Diff (Completed)   Iron, TIBC and Ferritin Panel (Completed)   Ambulatory referral to  Gastroenterology    Return in about 3 months (around 07/19/2023).    [name],  DO

## 2023-04-19 NOTE — Assessment & Plan Note (Signed)
He is doing well today I stressed him the importance of smoking cessation given his COPD and asthma dual diagnosis.  He is on triple combination inhaler as well as multiple allergen medications including Singulair and Nucala.  He has follow-up scheduled with pulmonology.

## 2023-04-19 NOTE — Assessment & Plan Note (Signed)
>>  ASSESSMENT AND PLAN FOR TOBACCO USE DISORDER WRITTEN ON 04/19/2023 10:52 AM BY Elfa Wooton C, DO  Congratulated on his success provided prescription for 7 mg patches to use if needed.

## 2023-04-19 NOTE — Assessment & Plan Note (Signed)
Congratulated on his success provided prescription for 7 mg patches to use if needed.

## 2023-04-19 NOTE — Assessment & Plan Note (Addendum)
Will repeat CBC and iron studies today to see if he needs additional IV iron replacement.  I went to go ahead and refer him back to gastroenterology as he will likely need a diagnostic colonoscopy at this point for evaluation of his iron deficiency anemia.  ADDENDUM: Iron Tsat improved to 14%.  Will reorder anotehr 500mg  venofer injection from short stay.

## 2023-04-19 NOTE — Assessment & Plan Note (Signed)
We reviewed his medications he is taking all of them as directed.  His sugars were high post discharge in the 300s he suspects is due to the prednisone more recently he had sugar in the 150s this morning.  Will want to keep A1c less than 7.5 especially given need for upcoming surgery

## 2023-04-22 ENCOUNTER — Telehealth: Payer: Self-pay | Admitting: *Deleted

## 2023-04-22 NOTE — Telephone Encounter (Signed)
Thank you :)

## 2023-04-22 NOTE — Telephone Encounter (Signed)
-----   Message from [name] sent at 04/22/2023  1:44 PM EDT ----- Rivka Barbara, I placed orders for a repeat iron infusion at short stay.  Can you help him get an appointment there? ----- Message ----- From: Interface, [facility] Lab Results In Sent: 04/19/2023   6:37 AM EDT To: [name], DO

## 2023-04-22 NOTE — Telephone Encounter (Addendum)
[facility] called to schedule the appt - no answer; message left. Also pt was called and informed of needing another iron infusion; he had not look at his lab results on My Chart, he was informed of Dr Neita Garnet comment of iron infusion;   "Your Hemoglobin and iron levels are improved but not yet to normal.  I want to get you set up for another iron infusion at short stay".

## 2023-04-22 NOTE — Addendum Note (Signed)
Addended by: [name] on: 04/22/2023 01:42 PM   Modules accepted: Orders

## 2023-04-23 NOTE — Telephone Encounter (Signed)
Pt called / informed iron infusion schedule Monday 7/22 @ 1300 PM. Pt's aware to go to Admission to register. He stated will call the Infusion Clinic to see if he can get a morning appt (yesterday pt stated anytime/any day).
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
## 2023-04-25 ENCOUNTER — Ambulatory Visit (HOSPITAL_COMMUNITY)
Admission: RE | Admit: 2023-04-25 | Discharge: 2023-04-25 | Disposition: A | Discharge status: Home / Self Care | Payer: 59 | Source: Ambulatory Visit | Attending: Vascular Surgery | Admitting: Vascular Surgery

## 2023-04-25 ENCOUNTER — Encounter: Payer: Self-pay | Admitting: Physician Assistant

## 2023-04-25 ENCOUNTER — Ambulatory Visit (INDEPENDENT_AMBULATORY_CARE_PROVIDER_SITE_OTHER): Payer: 59 | Admitting: Physician Assistant

## 2023-04-25 VITALS — BP 135/80 | HR 96 | Temp 98.0°F | Resp 20 | Ht 75.0 in | Wt 222.0 lb

## 2023-04-25 DIAGNOSIS — I70245 Atherosclerosis of native arteries of left leg with ulceration of other part of foot: Secondary | ICD-10-CM | POA: Insufficient documentation

## 2023-04-25 DIAGNOSIS — I70235 Atherosclerosis of native arteries of right leg with ulceration of other part of foot: Secondary | ICD-10-CM | POA: Diagnosis present

## 2023-04-25 DIAGNOSIS — I739 Peripheral vascular disease, unspecified: Secondary | ICD-10-CM

## 2023-04-25 LAB — VAS US ABI WITH/WO TBI: Left ABI: 1.56

## 2023-04-29 ENCOUNTER — Ambulatory Visit (HOSPITAL_COMMUNITY)
Admission: RE | Admit: 2023-04-29 | Discharge: 2023-04-29 | Disposition: A | Discharge status: Home / Self Care | Payer: 59 | Source: Ambulatory Visit | Attending: Internal Medicine | Admitting: Internal Medicine

## 2023-04-29 ENCOUNTER — Other Ambulatory Visit: Payer: Self-pay

## 2023-04-29 ENCOUNTER — Inpatient Hospital Stay (HOSPITAL_COMMUNITY): Admission: RE | Admit: 2023-04-29 | Payer: 59 | Source: Ambulatory Visit

## 2023-04-29 DIAGNOSIS — E1140 Type 2 diabetes mellitus with diabetic neuropathy, unspecified: Secondary | ICD-10-CM

## 2023-04-29 DIAGNOSIS — D509 Iron deficiency anemia, unspecified: Secondary | ICD-10-CM | POA: Diagnosis not present

## 2023-04-29 MED ORDER — IRON SUCROSE 500 MG IVPB - SIMPLE MED
500.0000 mg | Freq: Once | INTRAVENOUS | Status: AC
  Administered 2023-04-29: 500 mg via INTRAVENOUS
  Filled 2023-04-29: qty 500

## 2023-04-30 MED ORDER — PREGABALIN 100 MG PO CAPS
100.0000 mg | ORAL_CAPSULE | Freq: Three times a day (TID) | ORAL | 2 refills | Status: DC
Start: 2023-04-30 — End: 2023-09-26

## 2023-05-01 ENCOUNTER — Other Ambulatory Visit: Payer: Self-pay

## 2023-05-01 ENCOUNTER — Other Ambulatory Visit (HOSPITAL_COMMUNITY): Payer: Self-pay

## 2023-05-01 DIAGNOSIS — I739 Peripheral vascular disease, unspecified: Secondary | ICD-10-CM

## 2023-05-02 ENCOUNTER — Ambulatory Visit (INDEPENDENT_AMBULATORY_CARE_PROVIDER_SITE_OTHER): Payer: 59 | Admitting: Clinical

## 2023-05-02 DIAGNOSIS — Z91199 Patient's noncompliance with other medical treatment and regimen due to unspecified reason: Secondary | ICD-10-CM

## 2023-05-02 NOTE — Progress Notes (Signed)
Patient ID: Richard [name], male   DOB: 1961/02/16, 62 y.o.   MRN: [id number]  Therapy Progress Note  Patient had an appointment scheduled with therapist on 05/02/2023  at 9:00am.  He did not arrive for the session by 9:30am, so was considered a no show.  As per [facility] policy, he will be be charged for this no-show appointment.    Encounter Diagnosis  Name Primary?   No-show for appointment Yes     Ambrose Mantle, LCSW 05/02/2023, 9:47 AM

## 2023-05-16 ENCOUNTER — Ambulatory Visit (INDEPENDENT_AMBULATORY_CARE_PROVIDER_SITE_OTHER): Payer: 59 | Admitting: Clinical

## 2023-05-16 DIAGNOSIS — Z91199 Patient's noncompliance with other medical treatment and regimen due to unspecified reason: Secondary | ICD-10-CM
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
## 2023-05-21 ENCOUNTER — Other Ambulatory Visit: Payer: Self-pay

## 2023-05-23 ENCOUNTER — Telehealth: Payer: Self-pay | Admitting: Pharmacist

## 2023-05-23 NOTE — Telephone Encounter (Signed)
Spoke with patient on phone. He will plan to take Nucala tomorrow morning since it works better for his schedule

## 2023-05-23 NOTE — Telephone Encounter (Signed)
PT states :  "Just rec'd Nucalla 100 MG injections. Was supposed to start injections on 8/8. Requesting nurse to advise."  His number is [phone]

## 2023-05-23 NOTE — Telephone Encounter (Signed)
ATC patient - VM not set up. MyChart message sent to pt to advise him to administer Nucala today and re-time subsequent doses to every 4 weeks from today  Chesley Mires, PharmD, MPH, BCPS, CPP Clinical Pharmacist (Rheumatology and Pulmonology)

## 2023-05-27 ENCOUNTER — Ambulatory Visit: Payer: 59 | Admitting: Adult Health

## 2023-05-31 ENCOUNTER — Ambulatory Visit (HOSPITAL_COMMUNITY): Payer: 59 | Admitting: Clinical

## 2023-06-05 ENCOUNTER — Other Ambulatory Visit: Payer: Self-pay

## 2023-06-05 DIAGNOSIS — J4489 Other specified chronic obstructive pulmonary disease: Secondary | ICD-10-CM

## 2023-06-05 MED ORDER — ALBUTEROL SULFATE HFA 108 (90 BASE) MCG/ACT IN AERS
1.0000 | INHALATION_SPRAY | RESPIRATORY_TRACT | 2 refills | Status: DC | PRN
Start: 2023-06-05 — End: 2023-08-01

## 2023-06-17 ENCOUNTER — Other Ambulatory Visit: Payer: Self-pay

## 2023-06-17 ENCOUNTER — Other Ambulatory Visit (HOSPITAL_COMMUNITY): Payer: Self-pay

## 2023-06-17 MED ORDER — TRULICITY 4.5 MG/0.5ML ~~LOC~~ SOAJ: 4.5000 mg | SUBCUTANEOUS | 3 refills | Status: DC

## 2023-06-18 ENCOUNTER — Ambulatory Visit (INDEPENDENT_AMBULATORY_CARE_PROVIDER_SITE_OTHER): Payer: 59 | Admitting: Student

## 2023-06-18 ENCOUNTER — Telehealth: Payer: Self-pay | Admitting: Dietician

## 2023-06-18 ENCOUNTER — Encounter: Payer: Self-pay | Admitting: Student

## 2023-06-18 ENCOUNTER — Telehealth: Payer: Self-pay | Admitting: Internal Medicine

## 2023-06-18 VITALS — BP 117/73 | HR 100 | Ht 75.0 in | Wt 226.4 lb

## 2023-06-18 DIAGNOSIS — Z1211 Encounter for screening for malignant neoplasm of colon: Secondary | ICD-10-CM

## 2023-06-18 DIAGNOSIS — E1151 Type 2 diabetes mellitus with diabetic peripheral angiopathy without gangrene: Secondary | ICD-10-CM

## 2023-06-18 DIAGNOSIS — E1165 Type 2 diabetes mellitus with hyperglycemia: Secondary | ICD-10-CM

## 2023-06-18 DIAGNOSIS — D509 Iron deficiency anemia, unspecified: Secondary | ICD-10-CM

## 2023-06-18 DIAGNOSIS — I739 Peripheral vascular disease, unspecified: Secondary | ICD-10-CM

## 2023-06-18 LAB — POCT GLYCOSYLATED HEMOGLOBIN (HGB A1C): Hemoglobin A1C: 8.7 % — AB (ref 4.0–5.6)

## 2023-06-18 LAB — GLUCOSE, CAPILLARY: Glucose-Capillary: 211 mg/dL — ABNORMAL HIGH (ref 70–99)

## 2023-06-18 MED ORDER — TRULICITY 4.5 MG/0.5ML ~~LOC~~ SOAJ
4.5000 mg | SUBCUTANEOUS | 3 refills | Status: DC
Start: 2023-06-18 — End: 2023-10-31

## 2023-06-18 MED ORDER — SYNJARDY 12.5-1000 MG PO TABS: 1.0000 | ORAL_TABLET | Freq: Two times a day (BID) | ORAL | 5 refills | Status: DC

## 2023-06-18 MED ORDER — PIOGLITAZONE HCL 30 MG PO TABS
30.0000 mg | ORAL_TABLET | Freq: Every day | ORAL | 3 refills | Status: DC
Start: 2023-06-18 — End: 2023-08-15

## 2023-06-18 MED ORDER — CLOPIDOGREL BISULFATE 75 MG PO TABS
75.0000 mg | ORAL_TABLET | Freq: Every day | ORAL | 2 refills | Status: DC
Start: 2023-06-18 — End: 2023-08-15

## 2023-06-18 NOTE — Assessment & Plan Note (Signed)
No acute concerns about this problem today. Patient has been out of his plavix. Will refill his plavix today.

## 2023-06-18 NOTE — Progress Notes (Signed)
CC: Diabetes follow up  HPI:  Richard [name] is a 62 y.o. male with a past medical history of type 2 diabetes, PAD, hypertension, OA presenting for follow-up appointment.  Please see assessment and plan for full HPI.  Medications: Diabetic neuropathy: Lyrica 100 mg 3 times daily Type 2 diabetes: Pioglitazone 30 mg daily Synjardy 12.02-999 mg twice daily, Trulicity 4.5 mg weekly Asthma-COPD overlap syndrome: Breztri  Hyperlipidemia: Lipitor 40 mg daily Peripheral artery disease: Aspirin 81 mg daily, Plavix 75 mg daily  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   Hyperlipidemia    MRSA infection 09/05/2020   Need for Tdap vaccination 09/19/2018   Neuromuscular disorder (HCC)    neuropathy in feet   No natural teeth    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   Peripheral vascular disease (HCC)    Substance abuse (HCC)    crack - recovered x 30 yrs   Type II diabetes mellitus (HCC)    diagnosed in 2013     Current Outpatient Medications:    albuterol (PROVENTIL) (2.5 MG/3ML) 0.083% nebulizer solution, Take 3 mLs (2.5 mg total) by nebulization 2 (two) times daily., Disp: 75 mL, Rfl: 5   albuterol (VENTOLIN HFA) 108 (90 Base) MCG/ACT inhaler, Inhale 1-2 puffs into the lungs every 4 (four) hours as needed for wheezing or shortness of breath., Disp: 6.7 g, Rfl: 2   aspirin EC 81 MG EC tablet, Take 1 tablet (81 mg total) by mouth daily., Disp: 30 tablet, Rfl: 3   atorvastatin (LIPITOR) 40 MG  tablet, Take 1 tablet (40 mg total) by mouth daily., Disp: 90 tablet, Rfl: 3   Budeson-Glycopyrrol-Formoterol (BREZTRI AEROSPHERE) 160-9-4.8 MCG/ACT AERO, Inhale 2 puffs into the lungs in the morning and at bedtime., Disp: 10.7 g, Rfl: 11   clopidogrel (PLAVIX) 75 MG tablet, Take 1 tablet (75 mg total) by mouth daily., Disp: 30 tablet, Rfl: 2   Dulaglutide (TRULICITY) 4.5 MG/0.5ML SOPN, Inject 4.5 mg as directed once a week., Disp: 2 mL, Rfl: 3   Empagliflozin-metFORMIN HCl (SYNJARDY) 12.02-999 MG TABS, Take 1 tablet by mouth in the morning and at bedtime., Disp: 60 tablet, Rfl: 5   fexofenadine (ALLEGRA) 60 MG tablet, Take 1 tablet (60 mg total) by mouth 2 (two) times daily., Disp: 60 tablet, Rfl: 0   fluticasone (FLONASE) 50 MCG/ACT nasal spray, Place 1 spray into both nostrils daily., Disp: 16 g, Rfl: 11   meloxicam (MOBIC) 15 MG tablet, Take 1 tablet (15 mg total) by mouth daily as needed for pain., Disp: 30 tablet, Rfl: 2   Mepolizumab (NUCALA) 100 MG/ML SOAJ, Inject 1 mL (100 mg total) into the skin every 28 (twenty-eight) days., Disp: 1 mL, Rfl: 5   montelukast (SINGULAIR) 10 MG tablet, Take 1 tablet (10 mg total) by mouth at bedtime., Disp: 90 tablet, Rfl: 3   pioglitazone (ACTOS) 30 MG tablet, Take 1 tablet (30  mg total) by mouth daily., Disp: 90 tablet, Rfl: 3   pregabalin (LYRICA) 100 MG capsule, Take 1 capsule (100 mg total) by mouth 3 (three) times daily., Disp: 90 capsule, Rfl: 2   sildenafil (VIAGRA) 100 MG tablet, Take 1 tablet (100 mg total) by mouth as needed for erectile dysfunction. (Patient not taking: Reported on 04/18/2023), Disp: 10 tablet, Rfl: 3   triamcinolone ointment (KENALOG) 0.1 %, Apply 1 Application topically 2 (two) times daily. (Patient not taking: Reported on 04/08/2023), Disp: 30 g, Rfl: 5  Review of Systems:    Negative except what is stated in HPI.  Physical Exam:  Vitals:   06/18/23 0914  BP: 117/73  Pulse: 100  SpO2: 100%  Weight: 226 lb 6.4 oz (102.7  kg)  Height: 6\' 3"  (1.905 m)    General: Patient is sitting comfortably in the room  Head: Normocephalic, atraumatic  Cardio: Regular rate and rhythm, no murmurs, rubs or gallops Pulmonary: Clear to ausculation bilaterally with no rales, rhonchi, and crackles    Assessment & Plan:   Type 2 diabetes mellitus with peripheral artery disease (HCC) Patient has a past medical history of type 2 diabetes mellitus.  Current regimen includes pioglitazone 30 mg daily, Synjardy 12.02-999 mg twice daily, and Trulicity 4.5 mg weekly.  Patient reports compliance to his medications.  However, by looking at the dispense report, it does not seem as if patient is picking up his medications or if patient is taking his medications as prescribed given patient has not had his Synjardy nor his pioglitazone picked up in over a couple months.  Does look like he is taking his Trulicity as prescribed.  He does report he has been drinking a lot of lemonade and sodas which she indicates she will stop.  A1c today is elevated at 8.7 which is up from 7.9 about 3 months ago.  This is likely due to patient not taking his medications.  Encourage patient to pick up his medications, and he states that he will.  Plan: -Counseled patient on the importance of taking medication -Continue pioglitazone 30 mg daily -Continue Synjardy 12.02-999 mg twice daily -Continue Trulicity 4.5 mg weekly -Follow-up in 3 months for A1c check  Iron deficiency anemia Patient had past medical history of iron deficiency anemia.  Unclear etiology behind this.  Patient does not seem to be bleeding.  He denies any bleeding symptoms.  Will recheck iron levels today.  Will also check CBC.  From chart review, patient has been improving in his hemoglobin as well as iron levels.  Patient has had 2 iron infusions since last visit.  Plan: -Refer patient for colonoscopy -Check iron levels and ferritin -Obtain CBC  Peripheral arterial disease (HCC) No acute  concerns about this problem today. Patient has been out of his plavix. Will refill his plavix today.  Patient discussed with Dr. Princella Pellegrini, DO PGY-2 Internal Medicine Resident  Pager: [phone]

## 2023-06-18 NOTE — Telephone Encounter (Signed)
Patient with questions and concerns about insulin. He states he is trying to get his blood sugar down so he can have a knee replacement. He felt more positive about insulin at the end of our conversation. He scheduled an appointment for Thursday to continue our conversation.Marland Kitchen

## 2023-06-18 NOTE — Telephone Encounter (Signed)
Attempted to call today, no answer, will try back tomorrow.

## 2023-06-18 NOTE — Telephone Encounter (Signed)
Patient was here this morning to see Dr. Allena Katz.  He asking Dr. Mikey Bussing to give him a call back with some concerns that he is having.  Telephone number has been verified.

## 2023-06-18 NOTE — Assessment & Plan Note (Signed)
Patient has a past medical history of type 2 diabetes mellitus.  Current regimen includes pioglitazone 30 mg daily, Synjardy 12.02-999 mg twice daily, and Trulicity 4.5 mg weekly.  Patient reports compliance to his medications.  However, by looking at the dispense report, it does not seem as if patient is picking up his medications or if patient is taking his medications as prescribed given patient has not had his Synjardy nor his pioglitazone picked up in over a couple months.  Does look like he is taking his Trulicity as prescribed.  He does report he has been drinking a lot of lemonade and sodas which she indicates she will stop.  A1c today is elevated at 8.7 which is up from 7.9 about 3 months ago.  This is likely due to patient not taking his medications.  Encourage patient to pick up his medications, and he states that he will.  Plan: -Counseled patient on the importance of taking medication -Continue pioglitazone 30 mg daily -Continue Synjardy 12.02-999 mg twice daily -Continue Trulicity 4.5 mg weekly -Follow-up in 3 months for A1c check

## 2023-06-18 NOTE — Patient Instructions (Signed)
Richard [name], Richard [name] you for allowing me to take part in your care today.  Here are your instructions.  1.  Regarding your diabetes, please continue taking your medications as prescribed.  This includes pioglitazone 30 mg daily.  Synjardy 12.02-999 mg twice daily.  Trulicity 4.5 mg weekly.  Do not miss any doses.  I sent in for refills.  Please come back in 3 months for A1c check.  2.  I am referring you for colonoscopy.  Please await phone call to schedule  3.  I am checking your iron levels and your blood counts today.  I will call you with results.  4. Return in about 3 months for A1c check   Thank you, Dr. Allena Katz  If you have any other questions please contact the internal medicine clinic at [phone] If it is after hours, please call the [facility] at [phone] and then ask the person who picks up for the resident on call.

## 2023-06-18 NOTE — Assessment & Plan Note (Signed)
Patient had past medical history of iron deficiency anemia.  Unclear etiology behind this.  Patient does not seem to be bleeding.  He denies any bleeding symptoms.  Will recheck iron levels today.  Will also check CBC.  From chart review, patient has been improving in his hemoglobin as well as iron levels.  Patient has had 2 iron infusions since last visit.  Plan: -Refer patient for colonoscopy -Check iron levels and ferritin -Obtain CBC  Addendum: Iron stores are improving.  Will send an iron supplement 325 mg daily.

## 2023-06-19 ENCOUNTER — Other Ambulatory Visit (HOSPITAL_COMMUNITY): Payer: Self-pay

## 2023-06-19 LAB — IRON,TIBC AND FERRITIN PANEL
Ferritin: 48 ng/mL (ref 30–400)
Iron Saturation: 18 % (ref 15–55)
Iron: 61 ug/dL (ref 38–169)
Total Iron Binding Capacity: 340 ug/dL (ref 250–450)
UIBC: 279 ug/dL (ref 111–343)

## 2023-06-19 LAB — CBC
Hematocrit: 40.7 % (ref 37.5–51.0)
Hemoglobin: 12.4 g/dL — ABNORMAL LOW (ref 13.0–17.7)
MCH: 24 pg — ABNORMAL LOW (ref 26.6–33.0)
MCHC: 30.5 g/dL — ABNORMAL LOW (ref 31.5–35.7)
MCV: 79 fL (ref 79–97)
Platelets: 289 10*3/uL (ref 150–450)
RBC: 5.16 x10E6/uL (ref 4.14–5.80)
RDW: 22 % — ABNORMAL HIGH (ref 11.6–15.4)
WBC: 4.1 10*3/uL (ref 3.4–10.8)

## 2023-06-19 MED ORDER — FERROUS SULFATE 325 (65 FE) MG PO TABS: 325.0000 mg | ORAL_TABLET | Freq: Every day | ORAL | 3 refills | Status: DC

## 2023-06-19 NOTE — Addendum Note (Signed)
Addended by: [name] on: 06/19/2023 10:43 AM   Modules accepted: Orders

## 2023-06-19 NOTE — Progress Notes (Signed)
Internal Medicine Clinic Attending  Case discussed with the resident at the time of the visit.  We reviewed the resident's history and exam and pertinent patient test results.  I agree with the assessment, diagnosis, and plan of care documented in the resident's note.  

## 2023-06-20 ENCOUNTER — Ambulatory Visit (INDEPENDENT_AMBULATORY_CARE_PROVIDER_SITE_OTHER): Payer: 59 | Admitting: Dietician

## 2023-06-20 ENCOUNTER — Other Ambulatory Visit: Payer: Self-pay | Admitting: Dietician

## 2023-06-20 DIAGNOSIS — Z794 Long term (current) use of insulin: Secondary | ICD-10-CM | POA: Diagnosis not present

## 2023-06-20 DIAGNOSIS — E1151 Type 2 diabetes mellitus with diabetic peripheral angiopathy without gangrene: Secondary | ICD-10-CM

## 2023-06-20 LAB — GLUCOSE, CAPILLARY: Glucose-Capillary: 156 mg/dL — ABNORMAL HIGH (ref 70–99)

## 2023-06-20 MED ORDER — PEN NEEDLES 32G X 4 MM MISC: 1.0000 | Freq: Every day | 4 refills | Status: DC

## 2023-06-20 MED ORDER — INSULIN DEGLUDEC 100 UNIT/ML ~~LOC~~ SOPN: 20.0000 [IU] | PEN_INJECTOR | Freq: Every day | SUBCUTANEOUS | 4 refills | Status: DC

## 2023-06-20 MED ORDER — FREESTYLE LIBRE 3 SENSOR MISC
3 refills | Status: DC
Start: 2023-06-20 — End: 2024-01-01

## 2023-06-20 NOTE — Progress Notes (Signed)
Diabetes Self-Management Education  Visit Type: Annual Follow-Up  Appt. Start Time: 815 Appt. End Time: 0950  06/20/2023  [name], identified by name and date of birth, is a 62 y.o. male with a diagnosis of Diabetes:  .   ASSESSMENT  He deferred weighing today. He was focused on insulin initiation and CGM.  He was educated about sharps disposal, onset, peak and duration of insulin, how to use an insulin pen. He repeated demonstrations without difficulty.  His goal is to lower his blood sugars/A1c. Lab Results  Component Value Date   HGBA1C 8.7 (A) 06/18/2023   HGBA1C 7.9 (H) 03/21/2023   HGBA1C 7.3 (A) 01/24/2023   HGBA1C 10.4 (A) 09/27/2022   HGBA1C 8.6 (A) 06/14/2022    Discussed with Richard [name] today about starting insulin, stopping one of his other medications and starting CGM. Per Richard [name] and patient, he wants to stop his Actos when he starts the insulin tomorrow.    Diabetes Self-Management Education - 06/20/23 1000       Visit Information   Visit Type Annual Follow-Up      Health Coping   How would you rate your overall health? Good      Psychosocial Assessment   Patient Belief/Attitude about Diabetes Motivated to manage diabetes    What is the hardest part about your diabetes right now, causing you the most concern, or is the most worrisome to you about your diabetes?   Taking/obtaining medications    Self-care barriers Lack of transportation;Lack of material resources    Self-management support Doctor's office;CDE visits    Patient Concerns Glycemic Control;Monitoring;Medication    Special Needs None    Preferred Learning Style No preference indicated    Learning Readiness Ready    How often do you need to have someone help you when you read instructions, pamphlets, or other written materials from your doctor or pharmacy? 2 - Rarely    What is the last grade level you completed in school? 12   in college now at [employer]     Pre-Education  Assessment   Patient understands the diabetes disease and treatment process. Comprehends key points    Patient understands incorporating nutritional management into lifestyle. Comprehends key points   says he know what to do and does not need my help. He is going to try Gzero in stead of the sugared gatorade   Patient undertands incorporating physical activity into lifestyle. Comprehends key points    Patient understands using medications safely. Needs Instruction    Patient understands monitoring blood glucose, interpreting and using results Needs Instruction    Patient understands prevention, detection, and treatment of acute complications. Needs Review    Patient understands prevention, detection, and treatment of chronic complications. Compreheands key points    Patient understands how to develop strategies to address psychosocial issues. Comprehends key points    Patient understands how to develop strategies to promote health/change behavior. Comprehends key points      Complications   Last HgB A1C per patient/outside source 8.7 %    How often do you check your blood sugar? 3-4 times / week    Fasting Blood glucose range (mg/dL) 119-147    Postprandial Blood glucose range (mg/dL) >829    Number of hypoglycemic episodes per month 0    Number of hyperglycemic episodes ( >200mg /dL): Weekly    Can you tell when your blood sugar is high? --   need to assess at follow up  Have you had a dilated eye exam in the past 12 months? Yes    Have you had a dental exam in the past 12 months? Yes    Are you checking your feet? Yes    How many days per week are you checking your feet? 7      Dietary Intake   Breakfast not assessed today    Beverage(s) Gatorade      Activity / Exercise   Activity / Exercise Type ADL's;Light (walking / raking leaves)    How many days per week do you exercise? 3    How many minutes per day do you exercise? 60    Total minutes per week of exercise 180      Patient  Education   Previous Diabetes Education Yes (please comment)   here with me   Medications Taught/reviewed insulin/injectables, injection, site rotation, insulin/injectables storage and needle disposal.;Reviewed patients medication for diabetes, action, purpose, timing of dose and side effects.    Monitoring Taught/evaluated CGM (comment);Identified appropriate SMBG and/or A1C goals.    Acute complications Taught prevention, symptoms, and  treatment of hypoglycemia - the 15 rule.      Individualized Goals (developed by patient)   Medications take my medication as prescribed      Post-Education Assessment   Patient understands using medications safely. Comphrehends key points    Patient understands monitoring blood glucose, interpreting and using results Comprehends key points    Patient understands prevention, detection, and treatment of acute complications. Comprehends key points      Outcomes   Expected Outcomes Demonstrated interest in learning. Expect positive outcomes    Future DMSE 2 wks    Program Status Not Completed   needs CGM and DSMES follow up     Subsequent Visit   Since your last visit have you continued or begun to take your medications as prescribed? Yes    Since your last visit have you had your blood pressure checked? Yes    Is your most recent blood pressure lower, unchanged, or higher since your last visit? Unchanged    Since your last visit have you experienced any weight changes? No change    Since your last visit, are you checking your blood glucose at least once a day? No             Individualized Plan for Diabetes Self-Management Training:   Learning Objective:  Patient will have a greater understanding of diabetes self-management. Patient education plan is to attend individual and/or group sessions per assessed needs and concerns.   Plan:   Patient Instructions  Thank you for your visit today   You can pick up the Tresiba insulin today and pen  needles.   You said you are going to start taking 20 units on Friday, 9/13.   We put a Continuous glucose monitor on today to help you see what the insulin is doing.   Remember the lowest it can make your blood sugar go is the first thing in the morning assuming you are fasting.   I am out of the office next week but if you have questions, you can call the office or Dexcom support (number is in the app)    I will ask Richard [name] to order the [facility] 3 sensors for the next tie we meet.   Lupita Leash [phone]   Expected Outcomes:  Demonstrated interest in learning. Expect positive outcomes Education material provided: Diabetes Resources If problems or questions, patient  to contact team via:  Phone Future DSME appointment: 2 wks Norm Parcel, RD 06/20/2023 10:17 AM.

## 2023-06-20 NOTE — Patient Instructions (Signed)
Thank you for your visit today   You can pick up the Tresiba insulin today and pen needles.   You said you are going to start taking 20 units on Friday, 9/13.   We put a Continuous glucose monitor on today to help you see what the insulin is doing.   Remember the lowest it can make your blood sugar go is the first thing in the morning assuming you are fasting.   I am out of the office next week but if you have questions, you can call the office or Dexcom support (number is in the app)    I will ask Dr. Mikey Bussing to order the [facility] 3 sensors for the next tie we meet.   Lupita Leash [phone]

## 2023-06-20 NOTE — Telephone Encounter (Signed)
Richard [name] would like to use CGM to monitor his blood sugar while starting insulin

## 2023-06-21 ENCOUNTER — Other Ambulatory Visit (HOSPITAL_COMMUNITY): Payer: Self-pay

## 2023-06-21 NOTE — Telephone Encounter (Signed)
Called and spoke with him, appreciative of his visit with donna yesterday, things are going well.  Has CGM on, hopeful to have improvement in glycemic control to allow for knee surgery

## 2023-06-26 ENCOUNTER — Telehealth: Payer: Self-pay

## 2023-06-26 NOTE — Telephone Encounter (Signed)
Received notification from [facility] regarding a prior authorization for NUCALA. Authorization has been APPROVED from 06/26/2023 to 10/08/2023. Approval letter sent to scan center. Updated [employer] with current Prior Authorization information.  Authorization # [id number]

## 2023-06-26 NOTE — Telephone Encounter (Signed)
Per [name], current Prior Authorization is expiring.  Submitted a Prior Authorization request to [facility] for NUCALA via CoverMyMeds. Will update once we receive a response.   Richard [name]

## 2023-07-01 ENCOUNTER — Ambulatory Visit: Payer: 59 | Admitting: Pulmonary Disease

## 2023-07-02 ENCOUNTER — Ambulatory Visit (INDEPENDENT_AMBULATORY_CARE_PROVIDER_SITE_OTHER): Payer: 59 | Admitting: Dietician

## 2023-07-02 DIAGNOSIS — E1151 Type 2 diabetes mellitus with diabetic peripheral angiopathy without gangrene: Secondary | ICD-10-CM

## 2023-07-02 NOTE — Progress Notes (Signed)
Diabetes Self-Management Education  Visit Type: Annual Follow-Up  Appt. Start Time: 825 Appt. End Time: 855  07/02/2023  [name], identified by name and date of birth, is a 62 y.o. male with a diagnosis of Diabetes:  .   ASSESSMENT  He deferred weight today. He got his flu vax at [address] He is focused on getting his A1c down and blood sugar controlled.  He is happy with the results of the insulin. Had one warning that his blood sugar was going low in the 70s. He states he wished he had started insulin sooner.  He would like a copy of his blood sugar shared with Dr. Mikey Bussing and understands he needs to continue with current control  for 3 months for improved A1c results.   CGM sensor that patient brought was applied today. He would like to learn how to apply it at his next visit .  Lab Results  Component Value Date   HGBA1C 8.7 (A) 06/18/2023   HGBA1C 7.9 (H) 03/21/2023   HGBA1C 7.3 (A) 01/24/2023   HGBA1C 10.4 (A) 09/27/2022   HGBA1C 8.6 (A) 06/14/2022     CGM Results from download: 9/9 through 9/22  % Time CGM active:   99.6 %   (Goal >70%)  Average glucose:   149 mg/dL for 14 days  Glucose management indicator:   6.9 %  Time in range (70-180 mg/dL):   77 %   (Goal >16%)  Time High (181-250 mg/dL):   20 %   (Goal < 10%)  Time Very High (>250 mg/dL):    3 %   (Goal < 5%)  Time Low (54-69 mg/dL):   0 %   (Goal <9%)  Time Very Low (<54 mg/dL):   0 %   (Goal <6%)  Coefficient of variation:   31.6 %   (Goal <36%)      Diabetes Self-Management Education - 07/02/23 0900       Visit Information   Visit Type Annual Follow-Up      Health Coping   How would you rate your overall health? Excellent      Pre-Education Assessment   Patient understands incorporating nutritional management into lifestyle. --   he is drinking mostly water, some G zero . his satd goal is to eat meals on time even if he is not hungry, try not to skip meals, just eat less      Complications   Can you tell when your blood sugar is high? --   he answered this about low sugar. He denies symptoms of high blood sugar     Dietary Intake   Breakfast as above, he says even if he only eats an english muffin and egg, he intends to try to eat something at breakfast and lunch      Activity / Exercise   Activity / Exercise Type --   showed me his phone app that he is tracking his steps and already had > 2000 steps today     Patient Education   Previous Diabetes Education Yes (please comment)   MNT and dsmes in our office in the past   Monitoring Taught/evaluated CGM (comment)    Acute complications Taught prevention, symptoms, and  treatment of hypoglycemia - the 15 rule.   suggested he use other candy that is 100% sugar like lifesavers instad of peanut M & Ms to treat a low blood sugar     Individualized Goals (developed by patient)  Monitoring  Consistenly use CGM      Patient Self-Evaluation of Goals - Patient rates self as meeting previously set goals (% of time)   Medications >75% (most of the time)   he rated himself 100%, says he missed one day and took ot late one day. He is happy with the results of the insulin and reports no problems with taking it.     Post-Education Assessment   Patient understands monitoring blood glucose, interpreting and using results Needs Review      Outcomes   Expected Outcomes Demonstrated interest in learning. Expect positive outcomes   his knee was hurting today so he did not want to learn how to put his sensor on. He agrees to learn in 2 weeks when he returns.   Future DMSE 2 wks    Program Status Not Completed      Subsequent Visit   Since your last visit have you continued or begun to take your medications as prescribed? Yes   stopped actos when he started the [place] on 06/21/23   Since your last visit have you had your blood pressure checked? No    Since your last visit have you experienced any weight changes? --    deferred by patient request   Since your last visit, are you checking your blood glucose at least once a day? Yes   wore dexcom for 10 days, brought Freestyle libre 3 sensor today that was applied to the back of his left arm            Individualized Plan for Diabetes Self-Management Training:   Learning Objective:  Patient will have a greater understanding of diabetes self-management. Patient education plan is to attend individual and/or group sessions per assessed needs and concerns.   Plan:   Patient Instructions  Good Job lowering your blood sugar by taking insulin!  Eye doctor- Dr. Golda Acre, OD Address: [address] [place], [place] [address] Phone: [phone]  A referral was sent on 06/19/23: [facility]  Gastroenterologist in [place], [place]  Located in: [facility] [address]  Address: [address], [place], [place] [address]  Phone: [phone]   Follow up in 2 weeks with another [facility] sensor.  Lupita Leash [phone]    Expected Outcomes:  Demonstrated interest in learning. Expect positive outcomes (his knee was hurting today so he did not want to learn how to put his sensor on. He agrees to learn in 2 weeks when he returns.)  Education material provided: Diabetes Resources  If problems or questions, patient to contact team via:  Phone  Future DSME appointment: 2 wks

## 2023-07-02 NOTE — Patient Instructions (Addendum)
Good Job lowering your blood sugar by taking insulin!  Eye doctor- Dr. Golda Acre, OD Address: [address] [place], [place] [address] Phone: [phone]  A referral was sent on 06/19/23: [facility]  Gastroenterologist in [place], [place]  Located in: [place] [facility] [address]  Address: [address], [place], [place] [address]  Phone: [phone]   Follow up in 2 weeks with another [facility] sensor.  Lupita Leash [phone]

## 2023-07-03 ENCOUNTER — Ambulatory Visit: Payer: 59 | Admitting: Podiatry

## 2023-07-04 ENCOUNTER — Ambulatory Visit: Payer: 59 | Admitting: Adult Health

## 2023-07-10 ENCOUNTER — Telehealth: Payer: Self-pay | Admitting: Dietician

## 2023-07-10 NOTE — Telephone Encounter (Signed)
Richard [name] going crazy, called Richard [name] support- they advised and he took it out and put a new one in. He wanted to know if I could still upload. He is not connected in Libreview, only Dexcom. He agreed to put in the Clinic number in Libreview to remotely connect our office.

## 2023-07-15 ENCOUNTER — Other Ambulatory Visit: Payer: Self-pay

## 2023-07-15 MED ORDER — MELOXICAM 15 MG PO TABS: 15.0000 mg | ORAL_TABLET | Freq: Every day | ORAL | 2 refills | Status: DC | PRN

## 2023-07-17 ENCOUNTER — Telehealth: Payer: Self-pay | Admitting: Dietician

## 2023-07-17 DIAGNOSIS — R0602 Shortness of breath: Secondary | ICD-10-CM | POA: Diagnosis not present

## 2023-07-17 DIAGNOSIS — R404 Transient alteration of awareness: Secondary | ICD-10-CM | POA: Diagnosis not present

## 2023-07-17 DIAGNOSIS — J353 Hypertrophy of tonsils with hypertrophy of adenoids: Secondary | ICD-10-CM | POA: Diagnosis not present

## 2023-07-17 DIAGNOSIS — I350 Nonrheumatic aortic (valve) stenosis: Secondary | ICD-10-CM | POA: Diagnosis not present

## 2023-07-17 DIAGNOSIS — Z87891 Personal history of nicotine dependence: Secondary | ICD-10-CM | POA: Diagnosis not present

## 2023-07-17 DIAGNOSIS — I6523 Occlusion and stenosis of bilateral carotid arteries: Secondary | ICD-10-CM | POA: Diagnosis not present

## 2023-07-17 DIAGNOSIS — E785 Hyperlipidemia, unspecified: Secondary | ICD-10-CM | POA: Diagnosis not present

## 2023-07-17 DIAGNOSIS — Z91013 Allergy to seafood: Secondary | ICD-10-CM | POA: Diagnosis not present

## 2023-07-17 DIAGNOSIS — E1140 Type 2 diabetes mellitus with diabetic neuropathy, unspecified: Secondary | ICD-10-CM | POA: Diagnosis not present

## 2023-07-17 DIAGNOSIS — J441 Chronic obstructive pulmonary disease with (acute) exacerbation: Secondary | ICD-10-CM | POA: Diagnosis not present

## 2023-07-17 DIAGNOSIS — R479 Unspecified speech disturbances: Secondary | ICD-10-CM | POA: Diagnosis not present

## 2023-07-17 DIAGNOSIS — R739 Hyperglycemia, unspecified: Secondary | ICD-10-CM | POA: Diagnosis not present

## 2023-07-17 DIAGNOSIS — J351 Hypertrophy of tonsils: Secondary | ICD-10-CM | POA: Diagnosis not present

## 2023-07-17 DIAGNOSIS — R079 Chest pain, unspecified: Secondary | ICD-10-CM | POA: Diagnosis not present

## 2023-07-17 DIAGNOSIS — I10 Essential (primary) hypertension: Secondary | ICD-10-CM | POA: Diagnosis not present

## 2023-07-17 DIAGNOSIS — Z7984 Long term (current) use of oral hypoglycemic drugs: Secondary | ICD-10-CM | POA: Diagnosis not present

## 2023-07-17 DIAGNOSIS — I2510 Atherosclerotic heart disease of native coronary artery without angina pectoris: Secondary | ICD-10-CM | POA: Diagnosis not present

## 2023-07-17 DIAGNOSIS — R6889 Other general symptoms and signs: Secondary | ICD-10-CM | POA: Diagnosis not present

## 2023-07-17 DIAGNOSIS — R918 Other nonspecific abnormal finding of lung field: Secondary | ICD-10-CM | POA: Diagnosis not present

## 2023-07-17 DIAGNOSIS — R061 Stridor: Secondary | ICD-10-CM | POA: Diagnosis not present

## 2023-07-17 NOTE — Telephone Encounter (Signed)
Left voicemail for return call  

## 2023-07-18 ENCOUNTER — Ambulatory Visit (HOSPITAL_COMMUNITY): Payer: 59 | Admitting: Clinical

## 2023-07-18 ENCOUNTER — Encounter: Payer: Self-pay | Admitting: Dietician

## 2023-07-18 DIAGNOSIS — J441 Chronic obstructive pulmonary disease with (acute) exacerbation: Secondary | ICD-10-CM | POA: Diagnosis not present

## 2023-07-18 DIAGNOSIS — J351 Hypertrophy of tonsils: Secondary | ICD-10-CM | POA: Diagnosis not present

## 2023-07-18 DIAGNOSIS — R0602 Shortness of breath: Secondary | ICD-10-CM | POA: Diagnosis not present

## 2023-07-18 DIAGNOSIS — R079 Chest pain, unspecified: Secondary | ICD-10-CM | POA: Diagnosis not present

## 2023-07-19 ENCOUNTER — Emergency Department (HOSPITAL_COMMUNITY): Payer: 59

## 2023-07-19 ENCOUNTER — Other Ambulatory Visit: Payer: Self-pay

## 2023-07-19 ENCOUNTER — Encounter (HOSPITAL_COMMUNITY): Payer: Self-pay | Admitting: Emergency Medicine

## 2023-07-19 ENCOUNTER — Emergency Department (HOSPITAL_COMMUNITY)
Admission: EM | Admit: 2023-07-19 | Discharge: 2023-07-20 | Disposition: A | Discharge status: Home / Self Care | Payer: 59 | Attending: Emergency Medicine | Admitting: Emergency Medicine

## 2023-07-19 DIAGNOSIS — R6889 Other general symptoms and signs: Secondary | ICD-10-CM | POA: Diagnosis not present

## 2023-07-19 DIAGNOSIS — Z7902 Long term (current) use of antithrombotics/antiplatelets: Secondary | ICD-10-CM | POA: Insufficient documentation

## 2023-07-19 DIAGNOSIS — R0789 Other chest pain: Secondary | ICD-10-CM | POA: Diagnosis not present

## 2023-07-19 DIAGNOSIS — Z794 Long term (current) use of insulin: Secondary | ICD-10-CM | POA: Diagnosis not present

## 2023-07-19 DIAGNOSIS — I499 Cardiac arrhythmia, unspecified: Secondary | ICD-10-CM | POA: Diagnosis not present

## 2023-07-19 DIAGNOSIS — E1165 Type 2 diabetes mellitus with hyperglycemia: Secondary | ICD-10-CM | POA: Diagnosis not present

## 2023-07-19 DIAGNOSIS — Z743 Need for continuous supervision: Secondary | ICD-10-CM | POA: Diagnosis not present

## 2023-07-19 DIAGNOSIS — Z7951 Long term (current) use of inhaled steroids: Secondary | ICD-10-CM | POA: Insufficient documentation

## 2023-07-19 DIAGNOSIS — R404 Transient alteration of awareness: Secondary | ICD-10-CM | POA: Diagnosis not present

## 2023-07-19 DIAGNOSIS — Z7984 Long term (current) use of oral hypoglycemic drugs: Secondary | ICD-10-CM | POA: Insufficient documentation

## 2023-07-19 DIAGNOSIS — R739 Hyperglycemia, unspecified: Secondary | ICD-10-CM | POA: Diagnosis not present

## 2023-07-19 DIAGNOSIS — I2510 Atherosclerotic heart disease of native coronary artery without angina pectoris: Secondary | ICD-10-CM | POA: Insufficient documentation

## 2023-07-19 DIAGNOSIS — Z7982 Long term (current) use of aspirin: Secondary | ICD-10-CM | POA: Insufficient documentation

## 2023-07-19 DIAGNOSIS — R079 Chest pain, unspecified: Secondary | ICD-10-CM | POA: Diagnosis not present

## 2023-07-19 DIAGNOSIS — J441 Chronic obstructive pulmonary disease with (acute) exacerbation: Secondary | ICD-10-CM | POA: Insufficient documentation

## 2023-07-19 LAB — CBC
HCT: 42 % (ref 39.0–52.0)
Hemoglobin: 12.7 g/dL — ABNORMAL LOW (ref 13.0–17.0)
MCH: 24 pg — ABNORMAL LOW (ref 26.0–34.0)
MCHC: 30.2 g/dL (ref 30.0–36.0)
MCV: 79.4 fL — ABNORMAL LOW (ref 80.0–100.0)
Platelets: 281 10*3/uL (ref 150–400)
RBC: 5.29 MIL/uL (ref 4.22–5.81)
RDW: 19 % — ABNORMAL HIGH (ref 11.5–15.5)
WBC: 7.6 10*3/uL (ref 4.0–10.5)
nRBC: 0 % (ref 0.0–0.2)

## 2023-07-19 LAB — I-STAT VENOUS BLOOD GAS, ED
Acid-Base Excess: 0 mmol/L (ref 0.0–2.0)
Bicarbonate: 26 mmol/L (ref 20.0–28.0)
Calcium, Ion: 1.09 mmol/L — ABNORMAL LOW (ref 1.15–1.40)
HCT: 43 % (ref 39.0–52.0)
Hemoglobin: 14.6 g/dL (ref 13.0–17.0)
O2 Saturation: 99 %
Potassium: 6.1 mmol/L — ABNORMAL HIGH (ref 3.5–5.1)
Sodium: 132 mmol/L — ABNORMAL LOW (ref 135–145)
TCO2: 27 mmol/L (ref 22–32)
pCO2, Ven: 48 mm[Hg] (ref 44–60)
pH, Ven: 7.341 (ref 7.25–7.43)
pO2, Ven: 126 mm[Hg] — ABNORMAL HIGH (ref 32–45)

## 2023-07-19 LAB — I-STAT CHEM 8, ED
BUN: 35 mg/dL — ABNORMAL HIGH (ref 8–23)
Calcium, Ion: 1.09 mmol/L — ABNORMAL LOW (ref 1.15–1.40)
Chloride: 102 mmol/L (ref 98–111)
Creatinine, Ser: 1 mg/dL (ref 0.61–1.24)
Glucose, Bld: 429 mg/dL — ABNORMAL HIGH (ref 70–99)
HCT: 44 % (ref 39.0–52.0)
Hemoglobin: 15 g/dL (ref 13.0–17.0)
Potassium: 4.7 mmol/L (ref 3.5–5.1)
Sodium: 136 mmol/L (ref 135–145)
TCO2: 25 mmol/L (ref 22–32)

## 2023-07-19 LAB — URINALYSIS, ROUTINE W REFLEX MICROSCOPIC
Bacteria, UA: NONE SEEN
Bilirubin Urine: NEGATIVE
Glucose, UA: >=500 mg/dL — AB
Hgb urine dipstick: NEGATIVE
Ketones, ur: NEGATIVE mg/dL
Leukocytes,Ua: NEGATIVE
Nitrite: NEGATIVE
Protein, ur: NEGATIVE mg/dL
Specific Gravity, Urine: 1.026 (ref 1.005–1.030)
pH: 5 (ref 5.0–8.0)

## 2023-07-19 LAB — CBG MONITORING, ED: Glucose-Capillary: 268 mg/dL — ABNORMAL HIGH (ref 70–99)

## 2023-07-19 NOTE — ED Provider Notes (Incomplete)
[place] EMERGENCY DEPARTMENT AT [facility] Provider Note   CSN: [id number] Arrival date & time: 07/19/23  2153     History {Add pertinent medical, surgical, social history, OB history to HPI:1} Chief Complaint  Patient presents with  . Hyperglycemia   HPI Richard [name] is a 62 y.o. male with history of type 2 diabetes, CAD presenting for hyperglycemia.  Was discharged by [facility] earlier today for COPD exacerbation.  Was given steroids during that encounter.  Was advised that his blood sugar might spike.  Checked his blood pressure at home and it was in the 300s.  Endorsing dry mouth, polyuria and some shortness of breath.  Also mention that he has had some intermittent chest pain in the left side of his chest since he was discharged earlier today.  Chest pain is nonradiating.  Feels sharp.  It is reproducible.  Not worse with breathing.  EMS notes that blood sugar was 43.  Patient also mention that he took 20 units of Tresiba at 1900.    Hyperglycemia      Home Medications Prior to Admission medications   Medication Sig Start Date End Date Taking? Authorizing Provider  albuterol (PROVENTIL) (2.5 MG/3ML) 0.083% nebulizer solution Take 3 mLs (2.5 mg total) by nebulization 2 (two) times daily. 04/18/23   [name], DO  albuterol (VENTOLIN HFA) 108 (90 Base) MCG/ACT inhaler Inhale 1-2 puffs into the lungs every 4 (four) hours as needed for wheezing or shortness of breath. 06/05/23   [name], DO  aspirin EC 81 MG EC tablet Take 1 tablet (81 mg total) by mouth daily. 09/26/15   Burns, [name], MD  atorvastatin (LIPITOR) 40 MG tablet Take 1 tablet (40 mg total) by mouth daily. 07/21/22   Marolyn Haller, MD  Budeson-Glycopyrrol-Formoterol (BREZTRI AEROSPHERE) 160-9-4.8 MCG/ACT AERO Inhale 2 puffs into the lungs in the morning and at bedtime. 12/05/22   [name], MD  clopidogrel (PLAVIX) 75 MG tablet Take 1 tablet (75 mg total) by mouth  daily. 06/18/23   [name], DO  Continuous Glucose Sensor (FREESTYLE LIBRE 3 SENSOR) MISC Place 1 sensor on the skin every 14 days. Use to check glucose continuously 06/20/23   [name], DO  Dulaglutide (TRULICITY) 4.5 MG/0.5ML SOPN Inject 4.5 mg as directed once a week. 06/18/23   [name], DO  Empagliflozin-metFORMIN HCl (SYNJARDY) 12.02-999 MG TABS Take 1 tablet by mouth in the morning and at bedtime. 06/18/23 12/15/23  [name], DO  ferrous sulfate 325 (65 FE) MG tablet Take 1 tablet (325 mg total) by mouth daily. 06/19/23 06/18/24  [name], DO  fexofenadine (ALLEGRA) 60 MG tablet Take 1 tablet (60 mg total) by mouth 2 (two) times daily. 04/09/23 05/09/23  Morene Crocker, MD  fluticasone (FLONASE) 50 MCG/ACT nasal spray Place 1 spray into both nostrils daily. 03/22/23   [name], DO  insulin degludec (TRESIBA) 100 UNIT/ML FlexTouch Pen Inject 20 Units into the skin daily. 06/20/23   [name], DO  Insulin Pen Needle (PEN NEEDLES) 32G X 4 MM MISC 1 each by Does not apply route daily. 06/20/23   [name], DO  meloxicam (MOBIC) 15 MG tablet Take 1 tablet (15 mg total) by mouth daily as needed for pain. 07/15/23   [name], MD  Mepolizumab (NUCALA) 100 MG/ML SOAJ Inject 1 mL (100 mg total) into the skin every 28 (twenty-eight) days. 01/24/23   [name], MD  montelukast (  SINGULAIR) 10 MG tablet Take 1 tablet (10 mg total) by mouth at bedtime. 03/08/22   [name], DO  pioglitazone (ACTOS) 30 MG tablet Take 1 tablet (30 mg total) by mouth daily. 06/18/23   [name], DO  pregabalin (LYRICA) 100 MG capsule Take 1 capsule (100 mg total) by mouth 3 (three) times daily. 04/30/23   [name], DO  sildenafil (VIAGRA) 100 MG tablet Take 1 tablet (100 mg total) by mouth as needed for erectile dysfunction. Patient not taking: Reported on 04/18/2023 01/24/23   [name], DO  triamcinolone ointment (KENALOG) 0.1 % Apply 1 Application topically 2 (two)  times daily. Patient not taking: Reported on 04/08/2023 01/24/23   [name], DO      Allergies    Shellfish allergy    Review of Systems   Review of Systems  Physical Exam Updated Vital Signs BP (!) 142/92 (BP Location: Right Arm)   Pulse (!) 106   Temp 98.7 F (37.1 C) (Oral)   Resp (!) 21   Wt 102 kg   SpO2 97%   BMI 28.11 kg/m  Physical Exam  ED Results / Procedures / Treatments   Labs (all labs ordered are listed, but only abnormal results are displayed) Labs Reviewed  CBC - Abnormal; Notable for the following components:      Result Value   Hemoglobin 12.7 (*)    MCV 79.4 (*)    MCH 24.0 (*)    RDW 19.0 (*)    All other components within normal limits  URINALYSIS, ROUTINE W REFLEX MICROSCOPIC - Abnormal; Notable for the following components:   Color, Urine STRAW (*)    Glucose, UA >=500 (*)    All other components within normal limits  I-STAT CHEM 8, ED - Abnormal; Notable for the following components:   BUN 35 (*)    Glucose, Bld 429 (*)    Calcium, Ion 1.09 (*)    All other components within normal limits  COMPREHENSIVE METABOLIC PANEL  CBG MONITORING, ED    EKG None  Radiology No results found.  Procedures Procedures  {Document cardiac monitor, telemetry assessment procedure when appropriate:1}  Medications Ordered in ED Medications - No data to display  ED Course/ Medical Decision Making/ A&P   {   Click here for ABCD2, HEART and other calculatorsREFRESH Note before signing :1}                              Medical Decision Making Amount and/or Complexity of Data Reviewed Labs: ordered. Radiology: ordered.   ***  {Document critical care time when appropriate:1} {Document review of labs and clinical decision tools ie heart score, Chads2Vasc2 etc:1}  {Document your independent review of radiology images, and any outside records:1} {Document your discussion with family members, caretakers, and with consultants:1} {Document social  determinants of health affecting pt's care:1} {Document your decision making why or why not admission, treatments were needed:1} Final Clinical Impression(s) / ED Diagnoses Final diagnoses:  None    Rx / DC Orders ED Discharge Orders     None

## 2023-07-19 NOTE — ED Provider Notes (Signed)
[place] EMERGENCY DEPARTMENT AT [facility] Provider Note   CSN: [id number] Arrival date & time: 07/19/23  2153     History  Chief Complaint  Patient presents with   Hyperglycemia   HPI Richard [name] is a 62 y.o. male with history of type 2 diabetes, CAD presenting for hyperglycemia.  Was discharged by [facility] earlier today for COPD exacerbation.  Was given steroids during that encounter.  Was advised that his blood sugar might spike.  Checked his blood pressure at home and it was in the 300s.  Endorsing dry mouth, polyuria and some shortness of breath.  Also mention that he has had some intermittent chest pain in the left side of his chest since he was discharged earlier today.  Chest pain is nonradiating.  Feels sharp.  It is reproducible.  Not worse with breathing.  EMS notes that blood sugar was 43.  Patient also mention that he took 20 units of Tresiba at 1900.    Hyperglycemia      Home Medications Prior to Admission medications   Medication Sig Start Date End Date Taking? Authorizing Provider  albuterol (PROVENTIL) (2.5 MG/3ML) 0.083% nebulizer solution Take 3 mLs (2.5 mg total) by nebulization 2 (two) times daily. 04/18/23   [name], DO  albuterol (VENTOLIN HFA) 108 (90 Base) MCG/ACT inhaler Inhale 1-2 puffs into the lungs every 4 (four) hours as needed for wheezing or shortness of breath. 06/05/23   [name], DO  aspirin EC 81 MG EC tablet Take 1 tablet (81 mg total) by mouth daily. 09/26/15   Burns, [name], MD  atorvastatin (LIPITOR) 40 MG tablet Take 1 tablet (40 mg total) by mouth daily. 07/21/22   Marolyn Haller, MD  Budeson-Glycopyrrol-Formoterol (BREZTRI AEROSPHERE) 160-9-4.8 MCG/ACT AERO Inhale 2 puffs into the lungs in the morning and at bedtime. 12/05/22   [name], MD  clopidogrel (PLAVIX) 75 MG tablet Take 1 tablet (75 mg total) by mouth daily. 06/18/23   [name], DO  Continuous Glucose Sensor (FREESTYLE  LIBRE 3 SENSOR) MISC Place 1 sensor on the skin every 14 days. Use to check glucose continuously 06/20/23   [name], DO  Dulaglutide (TRULICITY) 4.5 MG/0.5ML SOPN Inject 4.5 mg as directed once a week. 06/18/23   [name], DO  Empagliflozin-metFORMIN HCl (SYNJARDY) 12.02-999 MG TABS Take 1 tablet by mouth in the morning and at bedtime. 06/18/23 12/15/23  [name], DO  ferrous sulfate 325 (65 FE) MG tablet Take 1 tablet (325 mg total) by mouth daily. 06/19/23 06/18/24  [name], DO  fexofenadine (ALLEGRA) 60 MG tablet Take 1 tablet (60 mg total) by mouth 2 (two) times daily. 04/09/23 05/09/23  Morene Crocker, MD  fluticasone (FLONASE) 50 MCG/ACT nasal spray Place 1 spray into both nostrils daily. 03/22/23   [name], DO  insulin degludec (TRESIBA) 100 UNIT/ML FlexTouch Pen Inject 20 Units into the skin daily. 06/20/23   [name], DO  Insulin Pen Needle (PEN NEEDLES) 32G X 4 MM MISC 1 each by Does not apply route daily. 06/20/23   [name], DO  meloxicam (MOBIC) 15 MG tablet Take 1 tablet (15 mg total) by mouth daily as needed for pain. 07/15/23   [name], MD  Mepolizumab (NUCALA) 100 MG/ML SOAJ Inject 1 mL (100 mg total) into the skin every 28 (twenty-eight) days. 01/24/23   [name], MD  montelukast (SINGULAIR) 10 MG tablet Take 1 tablet (10 mg  total) by mouth at bedtime. 03/08/22   [name], DO  pioglitazone (ACTOS) 30 MG tablet Take 1 tablet (30 mg total) by mouth daily. 06/18/23   [name], DO  pregabalin (LYRICA) 100 MG capsule Take 1 capsule (100 mg total) by mouth 3 (three) times daily. 04/30/23   [name], DO  sildenafil (VIAGRA) 100 MG tablet Take 1 tablet (100 mg total) by mouth as needed for erectile dysfunction. Patient not taking: Reported on 04/18/2023 01/24/23   [name], DO  triamcinolone ointment (KENALOG) 0.1 % Apply 1 Application topically 2 (two) times daily. Patient not taking: Reported on 04/08/2023 01/24/23   [name], DO      Allergies    Shellfish allergy    Review of Systems   See HPI for pertinent positives  Physical Exam Updated Vital Signs BP (!) 169/89   Pulse 77   Temp 97.7 F (36.5 C) (Oral)   Resp 18   Wt 102 kg   SpO2 100%   BMI 28.11 kg/m  Physical Exam Vitals and nursing note reviewed.  HENT:     Head: Normocephalic and atraumatic.     Mouth/Throat:     Mouth: Mucous membranes are moist.  Eyes:     General:        Right eye: No discharge.        Left eye: No discharge.     Conjunctiva/sclera: Conjunctivae normal.  Cardiovascular:     Rate and Rhythm: Normal rate and regular rhythm.     Pulses: Normal pulses.     Heart sounds: Normal heart sounds.  Pulmonary:     Effort: Pulmonary effort is normal.     Breath sounds: Normal breath sounds.  Abdominal:     General: Abdomen is flat.     Palpations: Abdomen is soft.  Skin:    General: Skin is warm and dry.  Neurological:     General: No focal deficit present.  Psychiatric:        Mood and Affect: Mood normal.     ED Results / Procedures / Treatments   Labs (all labs ordered are listed, but only abnormal results are displayed) Labs Reviewed  CBC - Abnormal; Notable for the following components:      Result Value   Hemoglobin 12.7 (*)    MCV 79.4 (*)    MCH 24.0 (*)    RDW 19.0 (*)    All other components within normal limits  URINALYSIS, ROUTINE W REFLEX MICROSCOPIC - Abnormal; Notable for the following components:   Color, Urine STRAW (*)    Glucose, UA >=500 (*)    All other components within normal limits  COMPREHENSIVE METABOLIC PANEL - Abnormal; Notable for the following components:   CO2 18 (*)    Glucose, Bld 274 (*)    Calcium 7.4 (*)    Total Protein 5.4 (*)    Albumin 2.8 (*)    All other components within normal limits  CBG MONITORING, ED - Abnormal; Notable for the following components:   Glucose-Capillary 268 (*)    All other components within normal limits  I-STAT CHEM 8, ED -  Abnormal; Notable for the following components:   BUN 35 (*)    Glucose, Bld 429 (*)    Calcium, Ion 1.09 (*)    All other components within normal limits  I-STAT VENOUS BLOOD GAS, ED - Abnormal; Notable for the following components:   pO2, Ven 126 (*)  Sodium 132 (*)    Potassium 6.1 (*)    Calcium, Ion 1.09 (*)    All other components within normal limits  CBG MONITORING, ED - Abnormal; Notable for the following components:   Glucose-Capillary 285 (*)    All other components within normal limits  CBG MONITORING, ED - Abnormal; Notable for the following components:   Glucose-Capillary 217 (*)    All other components within normal limits  BETA-HYDROXYBUTYRIC ACID  TROPONIN I (HIGH SENSITIVITY)  TROPONIN I (HIGH SENSITIVITY)    EKG None  Radiology DG Chest Port 1 View  Result Date: 07/20/2023 CLINICAL DATA:  Chest pain, hyperglycemia EXAM: PORTABLE CHEST 1 VIEW COMPARISON:  07/17/2023 FINDINGS: Heart and mediastinal contours are within normal limits. No focal opacities or effusions. No acute bony abnormality. IMPRESSION: No active disease. Electronically Signed   By: Charlett Nose M.D.   On: 07/20/2023 02:07    Procedures Procedures    Medications Ordered in ED Medications  acetaminophen (TYLENOL) tablet 1,000 mg (1,000 mg Oral Given 07/20/23 0033)  sodium chloride 0.9 % bolus 1,000 mL (1,000 mLs Intravenous New Bag/Given 07/20/23 0100)    ED Course/ Medical Decision Making/ A&P                                 Medical Decision Making Amount and/or Complexity of Data Reviewed Labs: ordered. Radiology: ordered.  Risk OTC drugs.   62 year old well-appearing male presenting for hyperglycemia.  Exam is unremarkable.  DDx includes DKA, HHS, hyperglycemia, sepsis, ACS.  Overall appears well, hemodynamically stable and in no acute distress.  Initial blood sugar here was in the 400s.  Treated with volume resuscitation.  Serial CBGs showed steadily improvement.  Last was  217.  On reassessment chest pain had resolved.  Troponins were negative EKG and chest x-ray were also reassuring.  Suspect his bike and his blood sugars likely due to his recent steroid treatment for his COPD exacerbation.  Suspect it will normalize over the next couple days.  Advised to continue treating his diabetes at home like he normally does.  Discussed return precautions.  Vital stable.  Discharged home with condition.        Final Clinical Impression(s) / ED Diagnoses Final diagnoses:  Hyperglycemia    Rx / DC Orders ED Discharge Orders     None         Gareth Eagle, PA-C 07/20/23 0314    Tilden Fossa, MD 07/20/23 [phone]

## 2023-07-19 NOTE — ED Triage Notes (Addendum)
Coming from home via EMS. Discharged from [facility] for COPD exacerbation today.  Was told that steroids he would receive would increase his CBG, and he indeed noted CBG steadily rising from 300s at home after DC.  In response patient took 20 units tresiba at 1900   EMS notes CBG 483, Dexcom reading "high", 156/88, HR106, 20G through which received 200cc NS.   Endorses dry mouth, excessive thirst, intermittent CP.

## 2023-07-20 LAB — COMPREHENSIVE METABOLIC PANEL
ALT: 18 U/L (ref 0–44)
AST: 23 U/L (ref 15–41)
Albumin: 2.8 g/dL — ABNORMAL LOW (ref 3.5–5.0)
Alkaline Phosphatase: 63 U/L (ref 38–126)
Anion gap: 11 (ref 5–15)
BUN: 21 mg/dL (ref 8–23)
CO2: 18 mmol/L — ABNORMAL LOW (ref 22–32)
Calcium: 7.4 mg/dL — ABNORMAL LOW (ref 8.9–10.3)
Chloride: 111 mmol/L (ref 98–111)
Creatinine, Ser: 0.85 mg/dL (ref 0.61–1.24)
GFR, Estimated: >60 mL/min (ref >60)
Glucose, Bld: 274 mg/dL — ABNORMAL HIGH (ref 70–99)
Potassium: 4.5 mmol/L (ref 3.5–5.1)
Sodium: 140 mmol/L (ref 135–145)
Total Bilirubin: 1.2 mg/dL (ref 0.3–1.2)
Total Protein: 5.4 g/dL — ABNORMAL LOW (ref 6.5–8.1)

## 2023-07-20 LAB — CBG MONITORING, ED
Glucose-Capillary: 217 mg/dL — ABNORMAL HIGH (ref 70–99)
Glucose-Capillary: 285 mg/dL — ABNORMAL HIGH (ref 70–99)

## 2023-07-20 LAB — TROPONIN I (HIGH SENSITIVITY)
Troponin I (High Sensitivity): 4 ng/L (ref <18)
Troponin I (High Sensitivity): 7 ng/L (ref <18)

## 2023-07-20 LAB — BETA-HYDROXYBUTYRIC ACID: Beta-Hydroxybutyric Acid: 0.13 mmol/L (ref 0.05–0.27)

## 2023-07-20 MED ORDER — SODIUM CHLORIDE 0.9 % IV BOLUS
1000.0000 mL | Freq: Once | INTRAVENOUS | Status: AC
  Administered 2023-07-20: 1000 mL via INTRAVENOUS

## 2023-07-20 MED ORDER — ACETAMINOPHEN 500 MG PO TABS
1000.0000 mg | ORAL_TABLET | Freq: Once | ORAL | Status: AC
  Administered 2023-07-20: 1000 mg via ORAL
  Filled 2023-07-20: qty 2

## 2023-07-20 MED ORDER — IBUPROFEN 800 MG PO TABS: 800.0000 mg | ORAL_TABLET | Freq: Once | ORAL | Status: DC

## 2023-07-20 NOTE — Discharge Instructions (Signed)
Evaluation today was overall reassuring.  Your blood sugar was initially high in the 400s but with fluid resuscitation did come down into the low 200s.  I do suspect that is related to your recent steroid treatment for your COPD.  Anticipate that your blood sugar will normalize in the next couple days.  Please continue to manage her diabetes as you normally do at home.  If you do develop shortness of breath, dry mouth, frequent urination, altered mental status or any other concerning symptom please return emergency department further evaluation.

## 2023-07-22 ENCOUNTER — Other Ambulatory Visit: Payer: Self-pay | Admitting: Interventional Radiology

## 2023-07-22 DIAGNOSIS — I739 Peripheral vascular disease, unspecified: Secondary | ICD-10-CM

## 2023-07-22 LAB — GLUCOSE, CAPILLARY: Glucose-Capillary: 391 mg/dL — ABNORMAL HIGH (ref 70–99)

## 2023-07-23 ENCOUNTER — Other Ambulatory Visit: Payer: Self-pay

## 2023-08-01 ENCOUNTER — Other Ambulatory Visit: Payer: Self-pay | Admitting: Internal Medicine

## 2023-08-01 DIAGNOSIS — J4489 Other specified chronic obstructive pulmonary disease: Secondary | ICD-10-CM

## 2023-08-01 MED ORDER — ALBUTEROL SULFATE HFA 108 (90 BASE) MCG/ACT IN AERS: 1.0000 | INHALATION_SPRAY | RESPIRATORY_TRACT | 2 refills | Status: DC | PRN

## 2023-08-01 NOTE — Telephone Encounter (Signed)
Given frequency of inhaler use and persistent symptoms, patient will need next available f/u for COPD. Please schedule with the residents in the next 1 week.

## 2023-08-01 NOTE — Telephone Encounter (Signed)
Pt states he used the last of his inhaler yesterday and has a couple of the solutions left.  Pt state he has to use his inhaler everyday.   albuterol (PROVENTIL) (2.5 MG/3ML) 0.083% nebulizer solution  albuterol (VENTOLIN HFA) 108 (90 Base) MCG/ACT inhaler    [id number] - [place], [place] - [address] AT [facility]

## 2023-08-01 NOTE — Telephone Encounter (Signed)
Appt has been scheduled with Dr Lyndee Leo on 11/7.

## 2023-08-01 NOTE — Telephone Encounter (Signed)
Per [facility], last Albuterol inh refill was 10/14. Pt stated he had an asthma attack and was in [facility] hospital for 2 days and he has been using his inhaler more lately. And needs a refill. He has refills on Albuterol neb solution - pt informed.

## 2023-08-06 ENCOUNTER — Ambulatory Visit (INDEPENDENT_AMBULATORY_CARE_PROVIDER_SITE_OTHER): Payer: 59 | Admitting: Dietician

## 2023-08-06 DIAGNOSIS — E1151 Type 2 diabetes mellitus with diabetic peripheral angiopathy without gangrene: Secondary | ICD-10-CM | POA: Diagnosis not present

## 2023-08-06 NOTE — Patient Instructions (Signed)
Call if needed.   Follow up 6-12 months or sooner as needed.  Lupita Leash [phone]

## 2023-08-06 NOTE — Progress Notes (Signed)
Diabetes Self-Management Education  Visit Type: Annual Follow-Up  Appt. Start Time: 1315 Appt. End Time: 1342  08/06/2023  [name], identified by name and date of birth, is a 62 y.o. male with a diagnosis of Diabetes:  Type 2.   ASSESSMENT He deferred weight today.  His concern is his glycemic control.  Patient doing well. No problems taking insulin. Happy with blood sugar control.( See CGM table below)  He should be able to have his knee surgery as  A1c should be  <7.5%. He is up to date with Health maintenance. He states he feels confident in administering insulin and using Continuous glucose monitoring system. He was connected to our office remotely today and demonstrated understanding of interpreting data by using teach back to show me his Glucose Management Indicator (GMI) for 7 days and 90 days.   CGM Results from download: Last 14 days  % Time CGM active:   98 %   (Goal >70%)  Average glucose:   125 mg/dL for 14 days  Glucose management indicator:   6.3 %  Time in range (70-180 mg/dL):   96 %   (Goal >40%)  Time High (181-250 mg/dL):   4 %   (Goal < 10%)  Time Very High (>250 mg/dL):    0 %   (Goal < 5%)  Time Low (54-69 mg/dL):   0 %   (Goal <2%)  Time Very Low (<54 mg/dL):   0 %   (Goal <7%)      Individualized Plan for Diabetes Self-Management Training:   Learning Objective:  Patient will have a greater understanding of diabetes self-management. Patient education plan is to attend individual and/or group sessions per assessed needs and concerns.   Plan:   Patient Instructions  Call if needed.   Follow up 6-12 months or sooner as needed.  Lupita Leash [phone]   Expected Outcomes:  Demonstrated interest in learning. Expect positive outcomes  Education material provided: Diabetes Resources  If problems or questions, patient to contact team via:  Phone  Future DSME appointment: Yearly or sooner if needed Norm Parcel, RD 08/06/2023 2:33 PM.

## 2023-08-07 ENCOUNTER — Other Ambulatory Visit: Payer: 59

## 2023-08-08 ENCOUNTER — Other Ambulatory Visit: Payer: Self-pay

## 2023-08-15 ENCOUNTER — Other Ambulatory Visit: Payer: Self-pay

## 2023-08-15 ENCOUNTER — Encounter: Payer: Self-pay | Admitting: Internal Medicine

## 2023-08-15 ENCOUNTER — Other Ambulatory Visit (HOSPITAL_COMMUNITY): Payer: Self-pay

## 2023-08-15 ENCOUNTER — Ambulatory Visit: Payer: 59 | Admitting: Internal Medicine

## 2023-08-15 VITALS — BP 128/64 | HR 97 | Temp 98.1°F | Ht 75.0 in | Wt 230.8 lb

## 2023-08-15 DIAGNOSIS — E1151 Type 2 diabetes mellitus with diabetic peripheral angiopathy without gangrene: Secondary | ICD-10-CM | POA: Diagnosis not present

## 2023-08-15 DIAGNOSIS — E1165 Type 2 diabetes mellitus with hyperglycemia: Secondary | ICD-10-CM | POA: Diagnosis not present

## 2023-08-15 DIAGNOSIS — Z7985 Long-term (current) use of injectable non-insulin antidiabetic drugs: Secondary | ICD-10-CM

## 2023-08-15 DIAGNOSIS — J4489 Other specified chronic obstructive pulmonary disease: Secondary | ICD-10-CM | POA: Diagnosis not present

## 2023-08-15 DIAGNOSIS — F17201 Nicotine dependence, unspecified, in remission: Secondary | ICD-10-CM | POA: Diagnosis not present

## 2023-08-15 DIAGNOSIS — J8283 Eosinophilic asthma: Secondary | ICD-10-CM

## 2023-08-15 DIAGNOSIS — I739 Peripheral vascular disease, unspecified: Secondary | ICD-10-CM

## 2023-08-15 DIAGNOSIS — J4550 Severe persistent asthma, uncomplicated: Secondary | ICD-10-CM

## 2023-08-15 MED ORDER — PIOGLITAZONE HCL 30 MG PO TABS
30.0000 mg | ORAL_TABLET | Freq: Every day | ORAL | 3 refills | Status: DC
Start: 2023-08-15 — End: 2023-09-26

## 2023-08-15 MED ORDER — SYNJARDY 12.5-1000 MG PO TABS: 1.0000 | ORAL_TABLET | Freq: Two times a day (BID) | ORAL | 5 refills | Status: AC

## 2023-08-15 MED ORDER — NUCALA 100 MG/ML ~~LOC~~ SOAJ
100.0000 mg | SUBCUTANEOUS | 5 refills | Status: DC
  Filled 2023-08-15 (×2): qty 1, 28d supply, fill #0
  Filled 2023-09-10: qty 1, 28d supply, fill #1
  Filled 2023-10-10: qty 1, 28d supply, fill #2
  Filled 2023-11-01: qty 1, 28d supply, fill #3
  Filled 2023-12-13: qty 1, 28d supply, fill #4

## 2023-08-15 MED ORDER — CLOPIDOGREL BISULFATE 75 MG PO TABS
75.0000 mg | ORAL_TABLET | Freq: Every day | ORAL | 2 refills | Status: DC
Start: 2023-08-15 — End: 2023-09-10

## 2023-08-15 MED ORDER — MONTELUKAST SODIUM 10 MG PO TABS
10.0000 mg | ORAL_TABLET | Freq: Every day | ORAL | 3 refills | Status: DC
Start: 2023-08-15 — End: 2024-03-05

## 2023-08-15 NOTE — Progress Notes (Signed)
Specialty Pharmacy Ongoing Clinical Assessment Note  Richard [name] is a 62 y.o. male who is being followed by the specialty pharmacy service for RxSp Asthma/COPD   Patient's specialty medication(s) reviewed today: Mepolizumab   Missed doses in the last 4 weeks: 2 (Patient reports that he was hospitalized and missed 2 doses.)   Patient/Caregiver did not have any additional questions or concerns.   Therapeutic benefit summary: Patient is achieving benefit   Adverse events/side effects summary: No adverse events/side effects   Patient's therapy is appropriate to: Continue    Goals Addressed             This Visit's Progress    Maintain pulmonary function as close to normal as possible       Patient is  patient has missed 2 months of therapy, but reports it was working well when he was on it . Patient will maintain adherence         Follow up:  6 months  [name] Specialty Pharmacist

## 2023-08-15 NOTE — Assessment & Plan Note (Signed)
Patient states he is currently not smoking. Congratulated patient and recommended continued cessation to optimize for surgery.

## 2023-08-15 NOTE — Progress Notes (Signed)
Subjective:  CC: DM follow up  HPI:  [name] is a 62 y.o. male with a past medical history stated below and presents today for above. Please see problem based assessment and plan for additional details.  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 75 mL 5   albuterol (VENTOLIN HFA) 108 (90 Base) MCG/ACT inhaler Inhale 1-2 puffs into the lungs every 4 (four) hours as needed for wheezing or shortness of breath. 6.7 g 2   aspirin EC 81 MG EC tablet Take 1 tablet (81 mg total) by mouth daily. 30 tablet 3   atorvastatin (LIPITOR) 40 MG tablet Take 1 tablet (40 mg total) by mouth daily. 90 tablet 3   Budeson-Glycopyrrol-Formoterol (BREZTRI AEROSPHERE) 160-9-4.8 MCG/ACT AERO Inhale 2 puffs into the lungs in the morning and at bedtime. 10.7 g 11   Continuous Glucose Sensor (FREESTYLE LIBRE 3 SENSOR) MISC Place 1 sensor on the skin every 14  days. Use to check glucose continuously 6 each 3   Dulaglutide (TRULICITY) 4.5 MG/0.5ML SOPN Inject 4.5 mg as directed once a week. 2 mL 3   ferrous sulfate 325 (65 FE) MG tablet Take 1 tablet (325 mg total) by mouth daily. 30 tablet 3   fexofenadine (ALLEGRA) 60 MG tablet Take 1 tablet (60 mg total) by mouth 2 (two) times daily. 60 tablet 0   fluticasone (FLONASE) 50 MCG/ACT nasal spray Place 1 spray into both nostrils daily. 16 g 11   insulin degludec (TRESIBA) 100 UNIT/ML FlexTouch Pen Inject 20 Units into the skin daily. 15 mL 4   Insulin Pen Needle (PEN NEEDLES) 32G X 4 MM MISC 1 each by Does not apply route daily. 100 each 4   meloxicam (MOBIC) 15 MG tablet Take 1 tablet (15 mg total) by mouth daily as needed for pain. 30 tablet 2   pregabalin (LYRICA) 100 MG capsule Take 1 capsule (100 mg total) by mouth 3 (three) times daily. 90 capsule 2   sildenafil (VIAGRA) 100 MG tablet Take 1 tablet (100 mg total) by mouth as needed for erectile dysfunction. (Patient not taking: Reported on 04/18/2023) 10 tablet 3   triamcinolone ointment (KENALOG) 0.1 % Apply 1 Application topically 2 (two) times daily. (Patient not taking: Reported  on 04/08/2023) 30 g 5   No current facility-administered medications on file prior to visit.    Review of Systems: ROS negative except for as is noted on the assessment and plan.  Objective:   Vitals:   08/15/23 0846  BP: 128/64  Pulse: 97  Temp: 98.1 F (36.7 C)  TempSrc: Oral  SpO2: 98%  Weight: 230 lb 12.8 oz (104.7 kg)  Height: 6\' 3"  (1.905 m)    Physical Exam: Constitutional: well-appearing, in no acute distress HENT: normocephalic atraumatic, mucous membranes moist Eyes: conjunctiva non-erythematous Neck: supple Cardiovascular: regular rate and rhythm, no m/r/g Pulmonary/Chest: normal work of breathing on room air, lungs clear to auscultation bilaterally Abdominal: soft, non-tender, non-distended MSK: normal bulk and tone Neurological: alert &  oriented x 3, 5/5 strength in bilateral upper and lower extremities, normal gait Skin: warm and dry  Assessment & Plan:   Type 2 diabetes mellitus with peripheral artery disease (HCC) A1c at last clinic visit two months ago was 8.7. Current regimen includes pioglitazone 30mg  daily, Synjardy 12.5-1000mg  BID, Trulicity 4.5mg  weekly. CGM shows blood sugar readings 96% within target range. Patient is very motivated to get his A1c less than 7.5 so that he can proceed with a knee replacement.  -Continue current regimen -Schedule another appointment in 1 month to recheck A1c  Asthma-COPD overlap syndrome Patient was recently seen in the ED for dyspnea related to COPD/asthma exacerbation. After receiving steroids, he returned to the ED for hyperglycemia. Since then patient has been adherent with his inhalers and has not had further respiratory symptoms.  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 Congratulated patient and recommended continued cessation to optimize for surgery.     Patient seen with Dr. Assunta Gambles MD [facility]  PGY-1 Pager: [phone] Date 08/15/2023  Time 10:57 AM

## 2023-08-15 NOTE — Progress Notes (Signed)
Specialty Pharmacy Refill Coordination Note  Richard [name] is a 62 y.o. male contacted today regarding refills of specialty medication(s) Mepolizumab   Patient requested Delivery   Delivery date: 08/20/23   Verified address: Patient address [address]  [place] [address]   Medication will be filled on 08/19/23.

## 2023-08-15 NOTE — Patient Instructions (Signed)
Mr Cassin,   It was a pleasure meeting you today,   It looks like your blood sugar is doing great. Please continue what you're doing, and we will plan to see you again in about a month to recheck your A1c.   Thanks! Dr Ottis Stain

## 2023-08-15 NOTE — Progress Notes (Signed)
Internal Medicine Clinic Attending  I was physically present during the key portions of the resident provided service and participated in the medical decision making of patient's management care. I reviewed pertinent patient test results.  The assessment, diagnosis, and plan were formulated together and I agree with the documentation in the resident's note.  [name], MD

## 2023-08-15 NOTE — Assessment & Plan Note (Signed)
A1c at last clinic visit two months ago was 8.7. Current regimen includes pioglitazone 30mg  daily, Synjardy 12.5-1000mg  BID, Trulicity 4.5mg  weekly. CGM shows blood sugar readings 96% within target range. Patient is very motivated to get his A1c less than 7.5 so that he can proceed with a knee replacement.  -Continue current regimen -Schedule another appointment in 1 month to recheck A1c

## 2023-08-15 NOTE — Assessment & Plan Note (Signed)
Patient was recently seen in the ED for dyspnea related to COPD/asthma exacerbation. After receiving steroids, he returned to the ED for hyperglycemia. Since then patient has been adherent with his inhalers and has not had further respiratory symptoms.  -Refillls for Albuterol, Breztri, Singulair, Nucala sent

## 2023-08-19 ENCOUNTER — Other Ambulatory Visit: Payer: Self-pay

## 2023-08-26 ENCOUNTER — Other Ambulatory Visit: Payer: Self-pay

## 2023-08-30 ENCOUNTER — Other Ambulatory Visit: Payer: Self-pay

## 2023-09-01 NOTE — Progress Notes (Unsigned)
Cardiology Office Note:  .   Date:  09/03/2023  ID:  [name], DOB [birth year], MRN [id number] PCP: [name], DO  [facility] Providers Cardiologist:  Lance Muss, MD    Patient Profile: .      PMH Coronary artery disease Mild nonobstructive CAD on cath 2017 COPD and asthma Hypertension Former tobacco abuse Aortic valve stenosis Mild AS, trileaflet aortic valve on TTE 10/06/2021 Former substance abuse Hyperlipidemia Type 2 DM Iron deficiency anemia PAD S/p amputation of 4th and 5th toes 2021 Severe bilateral pedal disease on angiogram February 2022  He established in 2017 after moving from [facility].  Admission to [facility] 09/26/2015 with chest pain.  He had multiple cardiac risk factors and EKG showed questionable anterior ST changes.  Cardiac catheterization showed mild nonobstructive CAD.  Echo with EF 50 to 55%, hemoglobin A1c was 10.  He had cut way back on smoking with Chantix.  At office visit with Dr. Eldridge Dace 09/18/2021, he reported chest discomfort when walking to the bus stop.  He was not exercising on a consistent basis.  His diabetes remained not well-controlled with a recent A1c of 9.9%.  Nuclear stress test 10/06/2021 was low risk with normal EF.  Echocardiogram on the same date revealed normal LV function, mild aortic stenosis, trileaflet aortic valve, mild concentric LVH, G1 DD.  Last cardiology clinic visit was 06/22/2022 with Dr. Eldridge Dace.  He had had 2 ER visits with chest pain, negative troponins.  No pattern or trigger that he could identify.  He was encouraged to continue lifestyle modification.  LDL was well-controlled.       History of Present Illness: Richard [name]   Richard [name] is a very pleasant 62 y.o. male who is here alone for follow-up of chest pain and hypertension. He reports intermittent chest pain, described as located in upper left chest. He thinks it  sometimes feels like indigestion, and is more frequent when he is active. He also reports  shortness of breath and occasional dizziness, but denies nausea or diaphoresis. He wakes up at times feeling like he cannot breath well which he attributes to asthma and requires the use of a nebulizer. No difficulty getting comfortable in the bed to go to sleep. Diabetes control has improved with Libre monitor. Recent A1c of 8.7, and a predicted A1c of 6.5 based on continuous glucose monitoring. He attributes this improvement to dietary changes. Has significant right knee pain and hopes to have surgery in the near future. He is awaiting referral from PCP. Working to improve iron levels and A1c in anticipation of this. He denies pain in the thighs. He is regularly walking between 3,000 and 6,000 steps a day, despite the knee pain, but notes that chest pain and asthma symptoms can worsen with increased activity. 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  Chest pain occurs more with activity and is associated with shortness of breath.  He attributes this to asthma and COPD.  He denies palpitations, presyncope, syncope, edema, orthopnea.   Discussed the use of AI scribe software for clinical note transcription with the patient, who gave verbal consent to proceed.   ROS: See HPI       Studies Reviewed: .        Risk Assessment/Calculations:            Physical Exam:   VS:  BP 122/70   Pulse 86   Ht 6\' 3"  (1.905 m)   Wt 230 lb 3.2  oz (104.4 kg)   SpO2 98%   BMI 28.77 kg/m    Wt Readings from Last 3 Encounters:  09/03/23 230 lb 3.2 oz (104.4 kg)  08/15/23 230 lb 12.8 oz (104.7 kg)  07/19/23 224 lb 13.9 oz (102 kg)    GEN: Well nourished, well developed in no acute distress NECK: No JVD; No carotid bruits CARDIAC: RRR, 3/6 systolic murmur audible throughout chest. No rubs, gallops RESPIRATORY:  Diminished breath sounds bilaterally with expiratory wheezing  ABDOMEN: Soft, non-tender, non-distended EXTREMITIES:  No edema; No deformity     ASSESSMENT AND PLAN: .     Chest pain/Mild CAD: Intermittent chest pain, more frequent with exertion. Associated with shortness of breath and occasional dizziness. He attributes symptoms to COPD/asthma. No change in frequency or intensity of pain. No significant CAD on cath in 2017. Low risk stress test 09/2021. We are updating echo as noted above. Consider repeat stress test prior to knee surgery to evaluate for ischemia.   COPD/Asthma: No increased work of breathing on exam. Frequent chest pain with increased shortness of breath, worse with exertion. Reports waking up in the middle of the night with difficulty breathing requiring nebulizer use. Reports compliance with inhaler regimen (Breztri, albuterol, Nucala) and Singulair. Recommend he maintain consistent follow-up with pulmonologist.  PAD: He has significant right knee pain but denies pain in thighs with walking. He is walking 3000-6000 steps daily. No acute concerns today. Maintain follow-up with [facility].   Hypertension: BP is well controlled. No medication changes today.   Hyperlipidemia: Lipid panel completed 11/22/2022 with total cholesterol 165, triglycerides 34, HDL 78, and LDL 80. Continue atorvastatin.   Diabetes: He reports improved glucose control with libre monitor.  He is tracking blood sugars consistently and reports A1c is predicted to be 6.5. Reports he is eating a healthier diet.  I congratulated him on this improvement.  Aortic stenosis: He has a murmur on exam that sounds consistent with aortic stenosis.  Prior echo 09/2021 revealed mild AS with mean gradient 12 mmHg, VTI 1.60 cm. ,mild dilatation of aortic root at 39 mm.  We will update echocardiogram for surveillance.  Preoperative cardiac evaluation: He is hoping to have knee surgery in the near future.  Is awaiting referral to orthopedics from PCP.  Advised that we will likely need to get [facility] for preop clearance. Advised him to notify us when surgery is scheduled.        Dispo: 6  months with me  Signed, Eligha Bridegroom, NP-C

## 2023-09-03 ENCOUNTER — Encounter: Payer: Self-pay | Admitting: Nurse Practitioner

## 2023-09-03 ENCOUNTER — Ambulatory Visit: Payer: 59 | Attending: Nurse Practitioner | Admitting: Nurse Practitioner

## 2023-09-03 VITALS — BP 122/70 | HR 86 | Ht 75.0 in | Wt 230.2 lb

## 2023-09-03 DIAGNOSIS — I2510 Atherosclerotic heart disease of native coronary artery without angina pectoris: Secondary | ICD-10-CM

## 2023-09-03 DIAGNOSIS — E785 Hyperlipidemia, unspecified: Secondary | ICD-10-CM

## 2023-09-03 DIAGNOSIS — Z01810 Encounter for preprocedural cardiovascular examination: Secondary | ICD-10-CM | POA: Diagnosis not present

## 2023-09-03 DIAGNOSIS — I70235 Atherosclerosis of native arteries of right leg with ulceration of other part of foot: Secondary | ICD-10-CM

## 2023-09-03 DIAGNOSIS — J4489 Other specified chronic obstructive pulmonary disease: Secondary | ICD-10-CM | POA: Diagnosis not present

## 2023-09-03 DIAGNOSIS — I350 Nonrheumatic aortic (valve) stenosis: Secondary | ICD-10-CM

## 2023-09-03 DIAGNOSIS — E1169 Type 2 diabetes mellitus with other specified complication: Secondary | ICD-10-CM | POA: Diagnosis not present

## 2023-09-03 DIAGNOSIS — R072 Precordial pain: Secondary | ICD-10-CM | POA: Diagnosis not present

## 2023-09-03 DIAGNOSIS — I739 Peripheral vascular disease, unspecified: Secondary | ICD-10-CM

## 2023-09-03 DIAGNOSIS — I10 Essential (primary) hypertension: Secondary | ICD-10-CM

## 2023-09-03 DIAGNOSIS — E1159 Type 2 diabetes mellitus with other circulatory complications: Secondary | ICD-10-CM | POA: Diagnosis not present

## 2023-09-03 NOTE — Patient Instructions (Addendum)
Medication Instructions:   Your physician recommends that you continue on your current medications as directed. Please refer to the Current Medication list given to you today.   *If you need a refill on your cardiac medications before your next appointment, please call your pharmacy*   Lab Work:  None ordered.  If you have labs (blood work) drawn today and your tests are completely normal, you will receive your results only by: MyChart Message (if you have MyChart) OR A paper copy in the mail If you have any lab test that is abnormal or we need to change your treatment, we will call you to review the results.   Testing/Procedures:  Your physician has requested that you have an echocardiogram. Echocardiography is a painless test that uses sound waves to create images of your heart. It provides your doctor with information about the size and shape of your heart and how well your heart's chambers and valves are working. This procedure takes approximately one hour. There are no restrictions for this procedure. Please do NOT wear cologne, aftershave, or lotions (deodorant is allowed). Please arrive 15 minutes prior to your appointment time.  Please note: We ask at that you not bring children with you during ultrasound (echo/ vascular) testing. Due to room size and safety concerns, children are not allowed in the ultrasound rooms during exams. Our front office staff cannot provide observation of children in our lobby area while testing is being conducted. An adult accompanying a patient to their appointment will only be allowed in the ultrasound room at the discretion of the ultrasound technician under special circumstances. We apologize for any inconvenience.    Follow-Up: At [facility], you and your health needs are our priority.  As part of our continuing mission to provide you with exceptional heart care, we have created designated Provider Care Teams.  These Care Teams include  your primary Cardiologist (physician) and Advanced Practice Providers (APPs -  Physician Assistants and Nurse Practitioners) who all work together to provide you with the care you need, when you need it.  We recommend signing up for the patient portal called "MyChart".  Sign up information is provided on this After Visit Summary.  MyChart is used to connect with patients for Virtual Visits (Telemedicine).  Patients are able to view lab/test results, encounter notes, upcoming appointments, etc.  Non-urgent messages can be sent to your provider as well.   To learn more about what you can do with MyChart, go to [email or web address].    Your next appointment:   6 month(s)  Provider:   Eligha Bridegroom, NP         Other Instructions  Your physician wants you to follow-up in: 6 months .  You will receive a reminder letter in the mail two months in advance. If you don't receive a letter, please call our office to schedule the follow-up appointment.

## 2023-09-04 ENCOUNTER — Emergency Department (HOSPITAL_COMMUNITY): Payer: 59

## 2023-09-04 ENCOUNTER — Other Ambulatory Visit: Payer: Self-pay

## 2023-09-04 ENCOUNTER — Inpatient Hospital Stay (HOSPITAL_COMMUNITY)
Admission: EM | Admit: 2023-09-04 | Discharge: 2023-09-07 | DRG: 197 | Disposition: A | Discharge status: Home / Self Care | Payer: 59 | Attending: Internal Medicine | Admitting: Internal Medicine

## 2023-09-04 ENCOUNTER — Encounter (HOSPITAL_COMMUNITY): Payer: Self-pay | Admitting: Emergency Medicine

## 2023-09-04 DIAGNOSIS — J45901 Unspecified asthma with (acute) exacerbation: Secondary | ICD-10-CM | POA: Diagnosis present

## 2023-09-04 DIAGNOSIS — Z87892 Personal history of anaphylaxis: Secondary | ICD-10-CM

## 2023-09-04 DIAGNOSIS — Z7982 Long term (current) use of aspirin: Secondary | ICD-10-CM

## 2023-09-04 DIAGNOSIS — Z79899 Other long term (current) drug therapy: Secondary | ICD-10-CM

## 2023-09-04 DIAGNOSIS — Z791 Long term (current) use of non-steroidal anti-inflammatories (NSAID): Secondary | ICD-10-CM | POA: Diagnosis not present

## 2023-09-04 DIAGNOSIS — R0602 Shortness of breath: Secondary | ICD-10-CM | POA: Diagnosis not present

## 2023-09-04 DIAGNOSIS — R0603 Acute respiratory distress: Secondary | ICD-10-CM | POA: Diagnosis not present

## 2023-09-04 DIAGNOSIS — Z91013 Allergy to seafood: Secondary | ICD-10-CM

## 2023-09-04 DIAGNOSIS — R059 Cough, unspecified: Secondary | ICD-10-CM | POA: Diagnosis not present

## 2023-09-04 DIAGNOSIS — Z7902 Long term (current) use of antithrombotics/antiplatelets: Secondary | ICD-10-CM | POA: Diagnosis not present

## 2023-09-04 DIAGNOSIS — J8283 Eosinophilic asthma: Principal | ICD-10-CM | POA: Diagnosis present

## 2023-09-04 DIAGNOSIS — Z87891 Personal history of nicotine dependence: Secondary | ICD-10-CM

## 2023-09-04 DIAGNOSIS — Z7984 Long term (current) use of oral hypoglycemic drugs: Secondary | ICD-10-CM | POA: Diagnosis not present

## 2023-09-04 DIAGNOSIS — I2510 Atherosclerotic heart disease of native coronary artery without angina pectoris: Secondary | ICD-10-CM | POA: Diagnosis not present

## 2023-09-04 DIAGNOSIS — J441 Chronic obstructive pulmonary disease with (acute) exacerbation: Secondary | ICD-10-CM | POA: Diagnosis not present

## 2023-09-04 DIAGNOSIS — E119 Type 2 diabetes mellitus without complications: Secondary | ICD-10-CM | POA: Diagnosis not present

## 2023-09-04 DIAGNOSIS — Z794 Long term (current) use of insulin: Secondary | ICD-10-CM | POA: Diagnosis not present

## 2023-09-04 DIAGNOSIS — Z7952 Long term (current) use of systemic steroids: Secondary | ICD-10-CM | POA: Diagnosis not present

## 2023-09-04 DIAGNOSIS — F17210 Nicotine dependence, cigarettes, uncomplicated: Secondary | ICD-10-CM | POA: Diagnosis not present

## 2023-09-04 DIAGNOSIS — Z7951 Long term (current) use of inhaled steroids: Secondary | ICD-10-CM

## 2023-09-04 LAB — COMPREHENSIVE METABOLIC PANEL WITH GFR
ALT: 21 U/L (ref 0–44)
AST: 20 U/L (ref 15–41)
Albumin: 3.7 g/dL (ref 3.5–5.0)
Alkaline Phosphatase: 81 U/L (ref 38–126)
Anion gap: 10 (ref 5–15)
BUN: 12 mg/dL (ref 8–23)
CO2: 23 mmol/L (ref 22–32)
Calcium: 9.3 mg/dL (ref 8.9–10.3)
Chloride: 103 mmol/L (ref 98–111)
Creatinine, Ser: 1.06 mg/dL (ref 0.61–1.24)
GFR, Estimated: >60 mL/min
Glucose, Bld: 145 mg/dL — ABNORMAL HIGH (ref 70–99)
Potassium: 4.4 mmol/L (ref 3.5–5.1)
Sodium: 136 mmol/L (ref 135–145)
Total Bilirubin: 0.6 mg/dL
Total Protein: 7.1 g/dL (ref 6.5–8.1)

## 2023-09-04 LAB — CBC WITH DIFFERENTIAL/PLATELET
Abs Immature Granulocytes: 0.03 10*3/uL (ref 0.00–0.07)
Basophils Absolute: 0.1 10*3/uL (ref 0.0–0.1)
Basophils Relative: 1 %
Eosinophils Absolute: 0.2 10*3/uL (ref 0.0–0.5)
Eosinophils Relative: 6 %
HCT: 45.7 % (ref 39.0–52.0)
Hemoglobin: 13.9 g/dL (ref 13.0–17.0)
Immature Granulocytes: 1 %
Lymphocytes Relative: 21 %
Lymphs Abs: 0.8 10*3/uL (ref 0.7–4.0)
MCH: 25 pg — ABNORMAL LOW (ref 26.0–34.0)
MCHC: 30.4 g/dL (ref 30.0–36.0)
MCV: 82 fL (ref 80.0–100.0)
Monocytes Absolute: 0.5 10*3/uL (ref 0.1–1.0)
Monocytes Relative: 13 %
Neutro Abs: 2.3 10*3/uL (ref 1.7–7.7)
Neutrophils Relative %: 58 %
Platelets: 265 10*3/uL (ref 150–400)
RBC: 5.57 MIL/uL (ref 4.22–5.81)
RDW: 15.3 % (ref 11.5–15.5)
WBC: 4 10*3/uL (ref 4.0–10.5)
nRBC: 0 % (ref 0.0–0.2)

## 2023-09-04 LAB — TROPONIN I (HIGH SENSITIVITY)
Troponin I (High Sensitivity): 5 ng/L
Troponin I (High Sensitivity): 4 ng/L (ref <18)

## 2023-09-04 LAB — RESP PANEL BY RT-PCR (RSV, FLU A&B, COVID)  RVPGX2
Influenza A by PCR: NEGATIVE
Influenza B by PCR: NEGATIVE
Resp Syncytial Virus by PCR: NEGATIVE
SARS Coronavirus 2 by RT PCR: NEGATIVE

## 2023-09-04 LAB — BRAIN NATRIURETIC PEPTIDE: B Natriuretic Peptide: 75.3 pg/mL (ref 0.0–100.0)

## 2023-09-04 MED ORDER — INSULIN ASPART 100 UNIT/ML IJ SOLN
0.0000 [IU] | Freq: Three times a day (TID) | INTRAMUSCULAR | Status: DC
  Administered 2023-09-05: 7 [IU] via SUBCUTANEOUS
  Administered 2023-09-05: 3 [IU] via SUBCUTANEOUS
  Administered 2023-09-05 – 2023-09-06 (×2): 7 [IU] via SUBCUTANEOUS

## 2023-09-04 MED ORDER — IPRATROPIUM BROMIDE 0.02 % IN SOLN
1.0000 mg | Freq: Once | RESPIRATORY_TRACT | Status: AC
  Administered 2023-09-04: 1 mg via RESPIRATORY_TRACT
  Filled 2023-09-04: qty 5

## 2023-09-04 MED ORDER — PREGABALIN 100 MG PO CAPS
100.0000 mg | ORAL_CAPSULE | Freq: Three times a day (TID) | ORAL | Status: DC
  Administered 2023-09-04 – 2023-09-07 (×8): 100 mg via ORAL
  Filled 2023-09-04 (×8): qty 1

## 2023-09-04 MED ORDER — MOMETASONE FURO-FORMOTEROL FUM 200-5 MCG/ACT IN AERO
2.0000 | INHALATION_SPRAY | Freq: Two times a day (BID) | RESPIRATORY_TRACT | Status: DC
  Administered 2023-09-04 – 2023-09-07 (×6): 2 via RESPIRATORY_TRACT
  Filled 2023-09-04: qty 8.8

## 2023-09-04 MED ORDER — IPRATROPIUM-ALBUTEROL 0.5-2.5 (3) MG/3ML IN SOLN
3.0000 mL | Freq: Once | RESPIRATORY_TRACT | Status: AC
  Administered 2023-09-04: 3 mL via RESPIRATORY_TRACT
  Filled 2023-09-04: qty 3

## 2023-09-04 MED ORDER — ATORVASTATIN CALCIUM 40 MG PO TABS
40.0000 mg | ORAL_TABLET | Freq: Every day | ORAL | Status: DC
  Administered 2023-09-04 – 2023-09-07 (×4): 40 mg via ORAL
  Filled 2023-09-04 (×4): qty 1

## 2023-09-04 MED ORDER — INSULIN DEGLUDEC 100 UNIT/ML ~~LOC~~ SOPN
20.0000 [IU] | PEN_INJECTOR | Freq: Every day | SUBCUTANEOUS | Status: DC
Start: 2023-09-04 — End: 2023-09-04

## 2023-09-04 MED ORDER — MAGNESIUM SULFATE 2 GM/50ML IV SOLN
2.0000 g | Freq: Once | INTRAVENOUS | Status: AC
  Administered 2023-09-04: 2 g via INTRAVENOUS
  Filled 2023-09-04: qty 50

## 2023-09-04 MED ORDER — ASPIRIN 81 MG PO TBEC
81.0000 mg | DELAYED_RELEASE_TABLET | Freq: Every day | ORAL | Status: DC
  Administered 2023-09-04 – 2023-09-07 (×4): 81 mg via ORAL
  Filled 2023-09-04 (×4): qty 1

## 2023-09-04 MED ORDER — RIVAROXABAN 10 MG PO TABS
10.0000 mg | ORAL_TABLET | Freq: Every day | ORAL | Status: DC
  Administered 2023-09-04 – 2023-09-07 (×4): 10 mg via ORAL
  Filled 2023-09-04 (×4): qty 1

## 2023-09-04 MED ORDER — INSULIN GLARGINE-YFGN 100 UNIT/ML ~~LOC~~ SOLN
20.0000 [IU] | Freq: Every day | SUBCUTANEOUS | Status: DC
  Administered 2023-09-04 – 2023-09-06 (×3): 20 [IU] via SUBCUTANEOUS
  Filled 2023-09-04 (×4): qty 0.2

## 2023-09-04 MED ORDER — ALBUTEROL SULFATE (2.5 MG/3ML) 0.083% IN NEBU
10.0000 mg/h | INHALATION_SOLUTION | Freq: Once | RESPIRATORY_TRACT | Status: AC
  Administered 2023-09-04: 10 mg/h via RESPIRATORY_TRACT
  Filled 2023-09-04: qty 3

## 2023-09-04 MED ORDER — EPINEPHRINE 0.3 MG/0.3ML IJ SOAJ
INTRAMUSCULAR | Status: AC
  Filled 2023-09-04: qty 0.3

## 2023-09-04 MED ORDER — PREDNISONE 20 MG PO TABS
40.0000 mg | ORAL_TABLET | Freq: Every day | ORAL | Status: DC
  Administered 2023-09-05 – 2023-09-07 (×3): 40 mg via ORAL
  Filled 2023-09-04 (×3): qty 2

## 2023-09-04 MED ORDER — METHYLPREDNISOLONE SODIUM SUCC 125 MG IJ SOLR
125.0000 mg | Freq: Once | INTRAMUSCULAR | Status: AC
Start: 2023-09-04 — End: 2023-09-04
  Administered 2023-09-04: 125 mg via INTRAVENOUS
  Filled 2023-09-04: qty 2

## 2023-09-04 MED ORDER — IPRATROPIUM-ALBUTEROL 0.5-2.5 (3) MG/3ML IN SOLN
3.0000 mL | RESPIRATORY_TRACT | Status: DC
  Administered 2023-09-04 – 2023-09-06 (×14): 3 mL via RESPIRATORY_TRACT
  Filled 2023-09-04 (×14): qty 3

## 2023-09-04 MED ORDER — ALBUTEROL SULFATE (2.5 MG/3ML) 0.083% IN NEBU
2.5000 mg | INHALATION_SOLUTION | Freq: Once | RESPIRATORY_TRACT | Status: AC
Start: 2023-09-04 — End: 2023-09-04
  Administered 2023-09-04: 2.5 mg via RESPIRATORY_TRACT
  Filled 2023-09-04: qty 3

## 2023-09-04 MED ORDER — RACEPINEPHRINE HCL 2.25 % IN NEBU
0.5000 mL | INHALATION_SOLUTION | Freq: Once | RESPIRATORY_TRACT | Status: AC
  Administered 2023-09-04: 0.5 mL via RESPIRATORY_TRACT
  Filled 2023-09-04: qty 0.5

## 2023-09-04 MED ORDER — METHYLPREDNISOLONE SODIUM SUCC 125 MG IJ SOLR
125.0000 mg | Freq: Once | INTRAMUSCULAR | Status: AC
  Administered 2023-09-04: 125 mg via INTRAVENOUS
  Filled 2023-09-04: qty 2

## 2023-09-04 MED ORDER — ALBUTEROL SULFATE (2.5 MG/3ML) 0.083% IN NEBU
5.0000 mg | INHALATION_SOLUTION | Freq: Once | RESPIRATORY_TRACT | Status: AC
Start: 2023-09-04 — End: 2023-09-04
  Administered 2023-09-04: 5 mg via RESPIRATORY_TRACT
  Filled 2023-09-04: qty 6

## 2023-09-04 NOTE — ED Provider Notes (Signed)
[place] EMERGENCY DEPARTMENT AT [facility] Provider Note   CSN: [id number] Arrival date & time: 09/04/23  0747     History  Chief Complaint  Patient presents with   Shortness of Breath    Richard [name] is a 62 y.o. male.  HPI 62 year old male with asthma and COPD, type 2 diabetes, CAD, and multiple other comorbidities presents with shortness of breath.  Started last night and has gotten worse.  He feels like this is as bad as the shortness of breath is never gotten.  He has been admitted before but never intubated.  He tried albuterol inhaler without relief.  He had a cough and chest pain/tightness.  No fevers or leg swelling.  Home Medications Prior to Admission medications   Medication Sig Start Date End Date Taking? Authorizing Provider  albuterol (PROVENTIL) (2.5 MG/3ML) 0.083% nebulizer solution Take 3 mLs (2.5 mg total) by nebulization 2 (two) times daily. 04/18/23   [name], DO  albuterol (VENTOLIN HFA) 108 (90 Base) MCG/ACT inhaler Inhale 1-2 puffs into the lungs every 4 (four) hours as needed for wheezing or shortness of breath. 08/01/23   [name], MD  aspirin EC 81 MG EC tablet Take 1 tablet (81 mg total) by mouth daily. 09/26/15   Burns, [name], MD  atorvastatin (LIPITOR) 40 MG tablet Take 1 tablet (40 mg total) by mouth daily. 07/21/22   Marolyn Haller, MD  Budeson-Glycopyrrol-Formoterol (BREZTRI AEROSPHERE) 160-9-4.8 MCG/ACT AERO Inhale 2 puffs into the lungs in the morning and at bedtime. 12/05/22   [name], MD  clopidogrel (PLAVIX) 75 MG tablet Take 1 tablet (75 mg total) by mouth daily. 08/15/23   [name], MD  Continuous Glucose Sensor (FREESTYLE LIBRE 3 SENSOR) MISC Place 1 sensor on the skin every 14 days. Use to check glucose continuously 06/20/23   [name], DO  Dulaglutide (TRULICITY) 4.5 MG/0.5ML SOPN Inject 4.5 mg as directed once a week. 06/18/23   [name], DO  Empagliflozin-metFORMIN HCl (SYNJARDY) 12.02-999  MG TABS Take 1 tablet by mouth in the morning and at bedtime. 08/15/23 02/11/24  [name], MD  ferrous sulfate 325 (65 FE) MG tablet Take 1 tablet (325 mg total) by mouth daily. 06/19/23 06/18/24  [name], DO  fexofenadine (ALLEGRA) 60 MG tablet Take 1 tablet (60 mg total) by mouth 2 (two) times daily. 04/09/23 05/09/23  Morene Crocker, MD  fluticasone (FLONASE) 50 MCG/ACT nasal spray Place 1 spray into both nostrils daily. 03/22/23   [name], DO  insulin degludec (TRESIBA) 100 UNIT/ML FlexTouch Pen Inject 20 Units into the skin daily. 06/20/23   [name], DO  Insulin Pen Needle (PEN NEEDLES) 32G X 4 MM MISC 1 each by Does not apply route daily. 06/20/23   [name], DO  meloxicam (MOBIC) 15 MG tablet Take 1 tablet (15 mg total) by mouth daily as needed for pain. 07/15/23   [name], MD  Mepolizumab (NUCALA) 100 MG/ML SOAJ Inject 1 mL (100 mg total) into the skin every 28 (twenty-eight) days. 08/15/23   [name], MD  montelukast (SINGULAIR) 10 MG tablet Take 1 tablet (10 mg total) by mouth at bedtime. 08/15/23   [name], MD  pioglitazone (ACTOS) 30 MG tablet Take 1 tablet (30 mg total) by mouth daily. 08/15/23   [name], MD  predniSONE (DELTASONE) 20 MG tablet Take 40 mg by mouth daily. 07/19/23   [provider]  pregabalin (LYRICA) 100 MG capsule  Take 1 capsule (100 mg total) by mouth 3 (three) times daily. 04/30/23   [name], DO  sildenafil (VIAGRA) 100 MG tablet Take 1 tablet (100 mg total) by mouth as needed for erectile dysfunction. 01/24/23   [name], DO  triamcinolone ointment (KENALOG) 0.1 % Apply 1 Application topically 2 (two) times daily. 01/24/23   [name], DO      Allergies    Shellfish allergy    Review of Systems   Review of Systems  Constitutional:  Negative for fever.  Respiratory:  Positive for cough, chest tightness and shortness of breath.   Cardiovascular:  Positive for chest pain. Negative for leg  swelling.    Physical Exam Updated Vital Signs BP 133/79   Pulse 82   Temp 98.4 F (36.9 C) (Oral)   Resp 17   SpO2 100%  Physical Exam Vitals and nursing note reviewed.  Constitutional:      Appearance: He is well-developed.  HENT:     Head: Normocephalic and atraumatic.     Mouth/Throat:     Pharynx: Oropharynx is clear. No pharyngeal swelling or oropharyngeal exudate.     Comments: No lip, tongue or oropharyngeal swelling Cardiovascular:     Rate and Rhythm: Regular rhythm. Tachycardia present.     Heart sounds: Normal heart sounds.  Pulmonary:     Effort: Pulmonary effort is normal. Tachypnea present. No respiratory distress.     Breath sounds: No stridor. Wheezing (diffuse, expiratory) present.     Comments: Patient does better with sitting up. He is able to talk in nearly complete sentences. Abdominal:     Palpations: Abdomen is soft.     Tenderness: There is no abdominal tenderness.  Musculoskeletal:     Right lower leg: No edema.     Left lower leg: No edema.  Skin:    General: Skin is warm and dry.  Neurological:     Mental Status: He is alert.     ED Results / Procedures / Treatments   Labs (all labs ordered are listed, but only abnormal results are displayed) Labs Reviewed  CBC WITH DIFFERENTIAL/PLATELET - Abnormal; Notable for the following components:      Result Value   MCH 25.0 (*)    All other components within normal limits  COMPREHENSIVE METABOLIC PANEL - Abnormal; Notable for the following components:   Glucose, Bld 145 (*)    All other components within normal limits  RESP PANEL BY RT-PCR (RSV, FLU A&B, COVID)  RVPGX2  BRAIN NATRIURETIC PEPTIDE  TROPONIN I (HIGH SENSITIVITY)  TROPONIN I (HIGH SENSITIVITY)    EKG EKG Interpretation Date/Time:  Wednesday September 04 2023 08:20:05 EST Ventricular Rate:  91 PR Interval:  173 QRS Duration:  86 QT Interval:  363 QTC Calculation: 447 R Axis:   31  Text Interpretation: Sinus rhythm  Probable left atrial enlargement RSR' in V1 or V2, right VCD or RVH no significant change since earlier in the day Confirmed by Pricilla Loveless [phone]) on 09/04/2023 8:50:55 AM  Radiology DG Chest Port 1 View  Result Date: 09/04/2023 CLINICAL DATA:  Cough.  Shortness of breath. EXAM: PORTABLE CHEST 1 VIEW COMPARISON:  07/19/2023. FINDINGS: Bilateral lung fields are clear. Bilateral costophrenic angles are clear. Stable cardio-mediastinal silhouette. No acute osseous abnormalities. The soft tissues are within normal limits. IMPRESSION: No active disease. Electronically Signed   By: Jules Schick M.D.   On: 09/04/2023 08:23    Procedures .Critical Care  Performed by:  Pricilla Loveless, MD Authorized by: Pricilla Loveless, MD   Critical care provider statement:    Critical care time (minutes):  30   Critical care time was exclusive of:  Separately billable procedures and treating other patients   Critical care was necessary to treat or prevent imminent or life-threatening deterioration of the following conditions:  Respiratory failure   Critical care was time spent personally by me on the following activities:  Development of treatment plan with patient or surrogate, discussions with consultants, evaluation of patient's response to treatment, examination of patient, ordering and review of laboratory studies, ordering and review of radiographic studies, ordering and performing treatments and interventions, pulse oximetry, re-evaluation of patient's condition and review of old charts     Medications Ordered in ED Medications  albuterol (PROVENTIL) (2.5 MG/3ML) 0.083% nebulizer solution (10 mg/hr Nebulization Given 09/04/23 0822)  ipratropium (ATROVENT) nebulizer solution 1 mg (1 mg Nebulization Given 09/04/23 0822)  magnesium sulfate IVPB 2 g 50 mL (0 g Intravenous Stopped 09/04/23 0921)  methylPREDNISolone sodium succinate (SOLU-MEDROL) 125 mg/2 mL injection 125 mg (125 mg Intravenous Given  09/04/23 0822)  albuterol (PROVENTIL) (2.5 MG/3ML) 0.083% nebulizer solution 5 mg (5 mg Nebulization Given 09/04/23 0920)  ipratropium-albuterol (DUONEB) 0.5-2.5 (3) MG/3ML nebulizer solution 3 mL (3 mLs Nebulization Given 09/04/23 1054)  albuterol (PROVENTIL) (2.5 MG/3ML) 0.083% nebulizer solution 2.5 mg (2.5 mg Nebulization Given 09/04/23 1054)    ED Course/ Medical Decision Making/ A&P                                 Medical Decision Making Amount and/or Complexity of Data Reviewed Labs: ordered.    Details: COVID and flu negative.  Normal WBC. Radiology: ordered.    Details: No pneumonia ECG/medicine tests: ordered and independent interpretation performed.    Details: No ischemia.  Risk Prescription drug management. Decision regarding hospitalization.   Patient had some increased work of breathing on arrival.  He was given multiple breathing treatments and has progressively improved but is still short of breath and wheezy.  However he has significantly improved to the point that I do not think he will need BiPAP and certainly will not need intubation.  Vital signs are improving.  No obvious bacterial cause at this time.  He was given Solu-Medrol, magnesium (he endorses a lifetime asthma component) and he will need admission for further monitoring and respiratory support.  Seems to be a COPD exacerbation.  Discussed with Dr. Sloan Leiter of internal medicine teaching service, who will admit.        Final Clinical Impression(s) / ED Diagnoses Final diagnoses:  COPD exacerbation (HCC)    Rx / DC Orders ED Discharge Orders     None         Pricilla Loveless, MD 09/04/23 1108

## 2023-09-04 NOTE — Progress Notes (Signed)
Called to bedside as patient was having some respiratory distress.  On arrival, patient was sitting on the side of the bed, getting treatment.  Patient was improving.  Per nursing, patient was doing fine, and then developed some respiratory distress.  Patient never desatted below the 90s.  On exam, patient had some upper inspiratory stridor that I appreciated.  On patient's mouth exam, patient did not have any tonsillitis or any obstruction in his airway.  Patient could just have asthma exacerbation flare, which patient states is happens at night sometimes.  Given concern for upper airway stridor, did give patient racemic epinephrine.  Did give 1 more dose of Solu-Medrol.  Continue DuoNebs around-the-clock.
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
## 2023-09-04 NOTE — ED Triage Notes (Signed)
Pt reports SHOB that started last night and progressively getting worse. Pt with hx of COPD and asthma.
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
## 2023-09-05 DIAGNOSIS — E119 Type 2 diabetes mellitus without complications: Secondary | ICD-10-CM

## 2023-09-05 DIAGNOSIS — F17210 Nicotine dependence, cigarettes, uncomplicated: Secondary | ICD-10-CM | POA: Diagnosis not present

## 2023-09-05 DIAGNOSIS — I2510 Atherosclerotic heart disease of native coronary artery without angina pectoris: Secondary | ICD-10-CM | POA: Diagnosis present

## 2023-09-05 DIAGNOSIS — J8283 Eosinophilic asthma: Secondary | ICD-10-CM | POA: Diagnosis present

## 2023-09-05 DIAGNOSIS — J441 Chronic obstructive pulmonary disease with (acute) exacerbation: Secondary | ICD-10-CM

## 2023-09-05 DIAGNOSIS — Z87892 Personal history of anaphylaxis: Secondary | ICD-10-CM | POA: Diagnosis not present

## 2023-09-05 DIAGNOSIS — Z7984 Long term (current) use of oral hypoglycemic drugs: Secondary | ICD-10-CM | POA: Diagnosis not present

## 2023-09-05 DIAGNOSIS — R0603 Acute respiratory distress: Secondary | ICD-10-CM | POA: Diagnosis not present

## 2023-09-05 DIAGNOSIS — Z7982 Long term (current) use of aspirin: Secondary | ICD-10-CM | POA: Diagnosis not present

## 2023-09-05 DIAGNOSIS — Z7951 Long term (current) use of inhaled steroids: Secondary | ICD-10-CM | POA: Diagnosis not present

## 2023-09-05 DIAGNOSIS — Z7952 Long term (current) use of systemic steroids: Secondary | ICD-10-CM | POA: Diagnosis not present

## 2023-09-05 DIAGNOSIS — Z91013 Allergy to seafood: Secondary | ICD-10-CM | POA: Diagnosis not present

## 2023-09-05 DIAGNOSIS — Z791 Long term (current) use of non-steroidal anti-inflammatories (NSAID): Secondary | ICD-10-CM | POA: Diagnosis not present

## 2023-09-05 DIAGNOSIS — Z79899 Other long term (current) drug therapy: Secondary | ICD-10-CM | POA: Diagnosis not present

## 2023-09-05 DIAGNOSIS — Z87891 Personal history of nicotine dependence: Secondary | ICD-10-CM | POA: Diagnosis not present

## 2023-09-05 DIAGNOSIS — Z7902 Long term (current) use of antithrombotics/antiplatelets: Secondary | ICD-10-CM | POA: Diagnosis not present

## 2023-09-05 DIAGNOSIS — Z794 Long term (current) use of insulin: Secondary | ICD-10-CM | POA: Diagnosis not present

## 2023-09-05 LAB — CBC
HCT: 42.9 % (ref 39.0–52.0)
Hemoglobin: 13.4 g/dL (ref 13.0–17.0)
MCH: 25.2 pg — ABNORMAL LOW (ref 26.0–34.0)
MCHC: 31.2 g/dL (ref 30.0–36.0)
MCV: 80.8 fL (ref 80.0–100.0)
Platelets: 244 10*3/uL (ref 150–400)
RBC: 5.31 MIL/uL (ref 4.22–5.81)
RDW: 15.1 % (ref 11.5–15.5)
WBC: 6.2 10*3/uL (ref 4.0–10.5)
nRBC: 0 % (ref 0.0–0.2)

## 2023-09-05 LAB — BASIC METABOLIC PANEL
Anion gap: 8 (ref 5–15)
BUN: 18 mg/dL (ref 8–23)
CO2: 21 mmol/L — ABNORMAL LOW (ref 22–32)
Calcium: 8.8 mg/dL — ABNORMAL LOW (ref 8.9–10.3)
Chloride: 104 mmol/L (ref 98–111)
Creatinine, Ser: 0.92 mg/dL (ref 0.61–1.24)
GFR, Estimated: >60 mL/min (ref >60)
Glucose, Bld: 234 mg/dL — ABNORMAL HIGH (ref 70–99)
Potassium: 4.7 mmol/L (ref 3.5–5.1)
Sodium: 133 mmol/L — ABNORMAL LOW (ref 135–145)

## 2023-09-05 LAB — GLUCOSE, CAPILLARY
Glucose-Capillary: 338 mg/dL — ABNORMAL HIGH (ref 70–99)
Glucose-Capillary: 204 mg/dL — ABNORMAL HIGH (ref 70–99)
Glucose-Capillary: 322 mg/dL — ABNORMAL HIGH (ref 70–99)
Glucose-Capillary: 308 mg/dL — ABNORMAL HIGH (ref 70–99)

## 2023-09-05 LAB — HIV ANTIBODY (ROUTINE TESTING W REFLEX): HIV Screen 4th Generation wRfx: NONREACTIVE

## 2023-09-05 MED ORDER — MONTELUKAST SODIUM 10 MG PO TABS
10.0000 mg | ORAL_TABLET | Freq: Every day | ORAL | Status: DC
  Administered 2023-09-05 – 2023-09-06 (×2): 10 mg via ORAL
  Filled 2023-09-05 (×2): qty 1

## 2023-09-05 MED ORDER — IPRATROPIUM-ALBUTEROL 0.5-2.5 (3) MG/3ML IN SOLN
3.0000 mL | RESPIRATORY_TRACT | Status: DC | PRN
  Administered 2023-09-06: 3 mL via RESPIRATORY_TRACT
  Filled 2023-09-05: qty 3

## 2023-09-05 MED ORDER — IPRATROPIUM-ALBUTEROL 0.5-2.5 (3) MG/3ML IN SOLN
RESPIRATORY_TRACT | Status: AC
  Filled 2023-09-05: qty 3

## 2023-09-05 NOTE — Progress Notes (Addendum)
HD#0 SUBJECTIVE:  Patient Summary: Richard [name] is a 62 y.o. male with past medical history of COPD and asthma who presented to ED with 2 days of worsening shortness of breath at home and admitted for exacerbation.  Overnight Events: None  Interim History: Patient was evaluated at bedside today, he was receiving breathing treatments, reported feeling dyspneic. Reported that his shortness of breath has not gotten better. He reports he is coughing more with the Duonebs. Reports that he still continues to wheeze. He had questions about inhalers that he is receiving here in the hospital compared to his triple therapy inhaler at home. All questions were answered.   OBJECTIVE:  Vital Signs: Vitals:   09/04/23 1343 09/04/23 1930 09/05/23 0641 09/05/23 0807  BP: 132/87 (!) 154/83 131/78 (!) 142/72  Pulse: 94 97 90 (!) 103  Resp: 16 18 18  (!) 22  Temp: 98.2 F (36.8 C) 97.7 F (36.5 C) 98 F (36.7 C)   TempSrc: Oral Oral Oral   SpO2: 100% 98%  100%  Weight:      Height:       Supplemental O2: Room Air SpO2: 100 % O2 Flow Rate (L/min): 6 L/min  Filed Weights   09/04/23 1341  Weight: 104.4 kg     Intake/Output Summary (Last 24 hours) at 09/05/2023 0959 Last data filed at 09/05/2023 0819 Gross per 24 hour  Intake 236 ml  Output --  Net 236 ml   Net IO Since Admission: 283.82 mL [09/05/23 0959]  Physical Exam: Physical Exam  General: mild distress  Cardio: Regular rate and rhythm, no murmurs, rubs or gallops.   Pulmonary: + diffuse wheeze bilaterally, increased work of breathing Abdomen: Soft, nontender with normoactive bowel sounds with no rebound or guarding  MSK: 5/5 strength to upper and lower extremities.   Neuro: Alert and oriented x3, no focal deficits  Skin: No rashes noted   Patient Lines/Drains/Airways Status     Active Line/Drains/Airways     Name Placement date Placement time Site Days   Peripheral IV 09/04/23 20 G 1" Anterior;Distal;Right;Upper Arm  09/04/23  0810  Arm  1             ASSESSMENT/PLAN:  Assessment: Active Problems:   Asthma exacerbation   Plan: [name] is a 62 y.o. person living with a history of COPD/asthma overlap, diabetes, PAD who presented with dyspnea and admitted for asthma/COPD exacerbation on hospital day 1   COPD/asthma exacerbation Patient presenting with 2 days of worsening dyspnea. He reports adherence with [name] and actually states that in the last month he is gone through his inhalers twice as fast as normal.  He denies recent sick symptoms and is continuing to be successful with tobacco cessation.  Has had increased coughing that is nonproductive.  Respiratory viral panel negative for COVID, RSV, and flu.  Unclear what has triggered patient's exacerbation. On chart review this is his fourth admission this year for COPD/asthma exacerbation.  He is already taking triple therapy inhaler at home and has nebulizers. Last PFT completed in March of this year that showed air trapping with bronchodilator response and mild diffusing capacity reduction.  No indication for azithromycin with no productive cough. On my evaluation today he had significant wheezing throughout lung fields with increased work of breathing. He is saturating well on room air.    Plan: -Continue prednisone 40 mg 2/5  -Start Singulair 10 mg, at bedtime -Continue scheduled DuoNebs every 4 hours for shortness of breath and wheezing -  Continued Dulera -PT evaluation  -Consider pulmonary rehab as outpatient   Diabetes Last A1c in September was elevated at 8.7.  He wears a freestyle libre at home.  Home medications include Tresiba 20 units nightly, Synjardy, and pioglitazone.  Will currently hold Synjardy and pioglitazone. 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  He has had some lapses in the last year, but feels that he is overall successful.    Best Practice: Diet: Regular diet IVF: Fluids: None, Rate: None VTE: rivaroxaban (XARELTO) tablet 10 mg Start: 09/04/23 1200 Code: Full AB: None Therapy Recs: None, DME: none DISPO: Anticipated discharge  1-2 days  to Home pending  medical management .  Signature: [name], D.O.  Internal Medicine Resident, PGY-1 [facility]  Pager: [phone] 9:59 AM, 09/05/2023   Please contact the on call pager after 5 pm and on weekends at [phone].

## 2023-09-05 NOTE — Care Management Obs Status (Signed)
MEDICARE OBSERVATION STATUS NOTIFICATION   Patient Details  Name: Richard [name] MRN: [id number] Date of Birth: May 01, 1961   Medicare Observation Status Notification Given:  Yes    Tom-Johnson, Hershal Coria, RN 09/05/2023, 11:56 AM

## 2023-09-06 LAB — GLUCOSE, CAPILLARY
Glucose-Capillary: 335 mg/dL — ABNORMAL HIGH (ref 70–99)
Glucose-Capillary: 226 mg/dL — ABNORMAL HIGH (ref 70–99)
Glucose-Capillary: 309 mg/dL — ABNORMAL HIGH (ref 70–99)
Glucose-Capillary: 306 mg/dL — ABNORMAL HIGH (ref 70–99)

## 2023-09-06 LAB — BASIC METABOLIC PANEL
Anion gap: 6 (ref 5–15)
BUN: 21 mg/dL (ref 8–23)
CO2: 26 mmol/L (ref 22–32)
Calcium: 8.9 mg/dL (ref 8.9–10.3)
Chloride: 107 mmol/L (ref 98–111)
Creatinine, Ser: 0.92 mg/dL (ref 0.61–1.24)
GFR, Estimated: >60 mL/min (ref >60)
Glucose, Bld: 172 mg/dL — ABNORMAL HIGH (ref 70–99)
Potassium: 4.6 mmol/L (ref 3.5–5.1)
Sodium: 139 mmol/L (ref 135–145)

## 2023-09-06 MED ORDER — IPRATROPIUM-ALBUTEROL 0.5-2.5 (3) MG/3ML IN SOLN
3.0000 mL | Freq: Four times a day (QID) | RESPIRATORY_TRACT | Status: DC
  Administered 2023-09-07 (×2): 3 mL via RESPIRATORY_TRACT
  Filled 2023-09-06 (×2): qty 3

## 2023-09-06 MED ORDER — GUAIFENESIN ER 600 MG PO TB12
600.0000 mg | ORAL_TABLET | Freq: Two times a day (BID) | ORAL | Status: DC
  Administered 2023-09-06 – 2023-09-07 (×3): 600 mg via ORAL
  Filled 2023-09-06 (×3): qty 1

## 2023-09-06 MED ORDER — INSULIN ASPART 100 UNIT/ML IJ SOLN
0.0000 [IU] | Freq: Three times a day (TID) | INTRAMUSCULAR | Status: DC
Start: 2023-09-06 — End: 2023-09-07
  Administered 2023-09-06: 7 [IU] via SUBCUTANEOUS
  Administered 2023-09-06: 15 [IU] via SUBCUTANEOUS
  Administered 2023-09-07: 7 [IU] via SUBCUTANEOUS
  Administered 2023-09-07: 15 [IU] via SUBCUTANEOUS

## 2023-09-06 NOTE — Progress Notes (Addendum)
HD#1 SUBJECTIVE:  Patient Summary: Richard [name] is a 62 y.o. male with past medical history of COPD, eosinophilic asthma, Diabetes who presented to ED with 2 days of worsening shortness of breath at home and admitted for exacerbation.   Overnight Events: None   Interim History: Patient is evaluated at bedside, he is sitting beside the bed receiving breathing treatments. patient reports that his breathing has stayed the same compared to yesterday, reports that it is worse at night, he reports that last night prior to receiving the breathing treatment, he felt chest tightness and wheezing.  He feels like his chest is congested, he feels like he wants to spit out phlegm however he is unable to do so.  Otherwise no obvious chest pain, abdominal pain, nausea, vomiting.  He is tolerating diet well.  No other concerns at this time.  OBJECTIVE:  Vital Signs: Vitals:   09/05/23 2251 09/06/23 0236 09/06/23 0351 09/06/23 0742  BP:   133/76 129/66  Pulse:   91 (!) 106  Resp:   16 18  Temp:    97.8 F (36.6 C)  TempSrc:      SpO2: 96% 95% 95% 100%  Weight:      Height:       Supplemental O2: Room Air SpO2: 100 % O2 Flow Rate (L/min): 6 L/min  Filed Weights   09/04/23 1341  Weight: 104.4 kg     Intake/Output Summary (Last 24 hours) at 09/06/2023 0903 Last data filed at 09/06/2023 6578 Gross per 24 hour  Intake 236 ml  Output --  Net 236 ml   Net IO Since Admission: 519.82 mL [09/06/23 0903]  Physical Exam: Physical Exam  General: No acute distress Cardiac: Regular rate, no murmurs, rubs or gallops. Pulmonary: Normal work of breathing, no wheezing apparent. Abdomen: Soft, nondistended, nontender, bowel sounds present MSK: Range of motion intact  Patient Lines/Drains/Airways Status     Active Line/Drains/Airways     Name Placement date Placement time Site Days   Peripheral IV 09/04/23 20 G 1" Anterior;Distal;Right;Upper Arm 09/04/23  0810  Arm  2              ASSESSMENT/PLAN:  Assessment: Active Problems:   Asthma exacerbation   Plan: Richard [name] is a 62 y.o. person living with a history of COPD/asthma overlap, diabetes, PAD who presented with dyspnea and admitted for asthma/COPD exacerbation on hospital day 1   COPD/asthma exacerbation Patient presenting with 2 days of worsening dyspnea. He reports adherence with [name] and actually states that in the last month he is gone through his inhalers twice as fast as normal.  He denies recent sick symptoms and is continuing to be successful with tobacco cessation.  Has had increased coughing that is nonproductive.  Respiratory viral panel negative for COVID, RSV, and flu.  Unclear what has triggered patient's exacerbation. On chart review this is his fourth admission this year for COPD/asthma exacerbation.  He is already taking triple therapy inhaler at home and has nebulizers. Last PFT completed in March of this year that showed air trapping with bronchodilator response and mild diffusing capacity reduction.  No indication for azithromycin with no productive cough.  On evaluation today, no wheezing is apparent per lung auscultation.  He has good airflow throughout the lung fields.  He does report that his breathing has been the same compared to yesterday, he still having chest tightness and wheezing at night, feels as if his chest is congested.    plan: -Continue prednisone  40 mg 3/5  -Continue Singulair 10 mg, at bedtime -Continue scheduled DuoNebs every 4 hours for shortness of breath and wheezing, every 2 hours as needed -Continued Dulera -Start Mucinex 600 mg twice daily, flutter valve, to help with decongestion -PT evaluation  -Consider pulmonary rehab as outpatient - Planning discharge tomorrow    Diabetes Last A1c in September was elevated at 8.7.  He wears a freestyle libre at home.  Home medications include Tresiba 20 units nightly, Synjardy, and pioglitazone. Will currently hold  Synjardy and pioglitazone. 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  He has had some lapses in the last year, but feels that he is overall successful.    Best Practice: Diet: Regular diet IVF: Fluids: None , Rate: None VTE: rivaroxaban (XARELTO) tablet 10 mg Start: 09/04/23 1200 Code: Full AB: None Therapy Recs: None, DME: none DISPO: Anticipated discharge  1-2 days  to Home pending  medical management .  Signature: [name], D.O.  Internal Medicine Resident, PGY-1 [facility]  Pager: [phone] 9:03 AM, 09/06/2023   Please contact the on call pager after 5 pm and on weekends at [phone].

## 2023-09-06 NOTE — Hospital Course (Addendum)
Richard [name] is a 62 y.o. person living with a history of COPD/asthma overlap, diabetes, PAD who presented with dyspnea and admitted for asthma/COPD exacerbation on hospital day 1   COPD/asthma exacerbation Patient presenting with 2 days of worsening dyspnea. He reports adherence with Richard [name] and actually states that in the last month he is gone through his inhalers twice as fast as normal.  He denies recent sick symptoms and is continuing to be successful with tobacco cessation.  Has had increased coughing that is nonproductive. Respiratory viral panel negative for COVID, RSV, and flu.  Unclear what has triggered patient's exacerbation. On chart review this is his fourth admission this year for COPD/asthma exacerbation.  He is already taking triple therapy inhaler at home and has nebulizers. Last PFT completed in March of this year that showed air trapping with bronchodilator response and mild diffusing capacity reduction.  No indication for azithromycin with no productive cough.  During this hospitalization, he was started on prednisone 40 mg for 5 days, we started Singulair 10 mg at bedtime, he has been getting scheduled DuoNebs every 4 hours for shortness of breath and wheezing, with as needed every 2 hours. He was also started on Dulera inhaler to cover for ICS+ LABA.  He was also given Mucinex, flutter valve to help with decongestion. Patient was counseled on pulmonary rehab upon discharge.  Diabetes Last A1c in September was elevated at 8.7.  He wears a freestyle libre at home.  Home medications include Tresiba 20 units nightly, Synjardy, and pioglitazone.  Will currently hold Synjardy and pioglitazone. We started him on home dose glargine 20 units at night, initially on sliding scale, changed to resistant sliding scale as he was started on prednisone.  Continue his Lyrica 100 mg 3 times daily.   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   He has had some lapses in the last year, but feels that he is overall successful.  ------------------------------------------------------------------------------------------------------------- Patient instructions   You came to the hospital for asthma/COPD exacerbation. We treated you with breathing treatments and steroids.    For your Asthma/COPD exacerbation  - START Singulair 10 mg, take one tablet by mouth daily - START prednisone 40 mg, take 1 tablet by mouth daily for ____ days.  - Continue your triple therapy inhaler, Breztri twice daily -I advise you to follow-up with your pulmonologist, consider pulmonary rehab  Diabetes  -Continue your home medication Synjardy and pioglitazone -Continue your Tresiba 20 units nightly -Continue your Lyrica 100 mg 3 times daily  If you have any of these following symptoms, please call us or seek care at an emergency department: -Chest Pain -Difficulty Breathing -Worsening abdominal pain -Syncope (passing out) -Drooping of face -Slurred speech -Sudden weakness in your leg or arm -Fever -Chills -blood in the stool -dark black, sticky stool  If you have any questions or concerns, call our clinic at [phone] or after hours call [phone] and ask for the internal medicine resident on call.  I am glad you are feeling better. It was a pleasure taking care for you. I wish a good recovery and good health!   --------------------------------------------------------------------------

## 2023-09-06 NOTE — Progress Notes (Signed)
Transition of Care [facility]) - Inpatient Brief Assessment   Patient Details  Name: Richard [name] MRN: [id number] Date of Birth: 1961-04-25  Transition of Care [facility]) CM/SW Contact:    Janae Bridgeman, RN Phone Number: 09/06/2023, 10:26 AM   Clinical Narrative: Transition of Care [facility]) - Inpatient Brief Assessment   Patient Details  Name: Richard [name] MRN: [id number] Date of Birth: 1961/07/16  Transition of Care [facility]) CM/SW Contact:    Janae Bridgeman, RN Phone Number: 09/06/2023, 10:26 AM   Clinical Narrative: CM noted that patient admitted from home with COPD.  No TOC needs at this time.  Patient is independent and remains on room air.   Transition of Care Asessment: Insurance and Status: (P) Insurance coverage has been reviewed Patient has primary care physician: (P) Yes Home environment has been reviewed: (P) from home Prior level of function:: (P) Independent Prior/Current Home Services: (P) No current home services Social Determinants of Health Reivew: (P) SDOH reviewed interventions complete, SDOH reviewed no interventions necessary Readmission risk has been reviewed: (P) Yes Transition of care needs: (P) no transition of care needs at this time    Transition of Care Asessment: Insurance and Status: (P) Insurance coverage has been reviewed Patient has primary care physician: (P) Yes Home environment has been reviewed: (P) from home Prior level of function:: (P) Independent Prior/Current Home Services: (P) No current home services Social Determinants of Health Reivew: (P) SDOH reviewed interventions complete, SDOH reviewed no interventions necessary Readmission risk has been reviewed: (P) Yes Transition of care needs: (P) no transition of care needs at this time

## 2023-09-06 NOTE — Inpatient Diabetes Management (Signed)
Inpatient Diabetes Program Recommendations  AACE/ADA: New Consensus Statement on Inpatient Glycemic Control (2015)  Target Ranges:  Prepandial:   less than 140 mg/dL      Peak postprandial:   less than 180 mg/dL (1-2 hours)      Critically ill patients:  140 - 180 mg/dL   Lab Results  Component Value Date   GLUCAP 335 (H) 09/06/2023   HGBA1C 8.7 (A) 06/18/2023    Review of Glycemic Control  Diabetes history: DM 2 Outpatient Diabetes medications: Trulicity 4.5 mg weekly, Synjardy 12.02-999 mg bid, Tresiba 20 units Daily, Actos 30 mg Daily Current orders for Inpatient glycemic control:  Semglee 20 units qhs Novolog 0-20 units tid  PO prednisone 40 mg Daily  Inpatient Diabetes Program Recommendations:    Note: glucose trends in increase after PO intake and steroid dose.  -  consider adding Novolog 4 units tid meal coverage if pt eating >50% of meals.  Thanks,  Christena Deem RN, MSN, BC-ADM Inpatient Diabetes Coordinator Team Pager [phone] (8a-5p)

## 2023-09-06 NOTE — Evaluation (Signed)
Physical Therapy Evaluation and Discharge Patient Details Name: Richard [name] MRN: [id number] DOB: 02-Nov-1960 Today's Date: 09/06/2023  History of Present Illness  62 y.o. male admitted 11/27 for COPD exacerbation. PMHx: COPD, eosinophilic asthma, Diabetes.  Clinical Impression  Patient evaluated by Physical Therapy with no further acute PT needs identified. All education has been completed and the patient has no further questions. 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 Pt ambulates without evidence of LOB, no assistive device, low fall risk, up to 300 feet today. SpO2 97% on RA with DOE 2/3, and RPE reported as 10/10. Educated on pacing, energy conservation, and symptom awareness. May benefit from pulmonary rehab follow-up at d/c. No acute PT needs identified. Anticipate functional capacity will continue to improve with resolution of acute respiratory issues, however, because COPD is a chronic condition, pulmonary rehab may assist with some improvement and pt can learn techniques for management/maintenance, as well as further resources for living an active life with this disorder. All questions answered. Respiratory therapy in room end of session to offer pt breathing treatment. PT is signing off. Thank you for this referral.          Can travel by private vehicle    yes    Equipment Recommendations None recommended by PT  Recommendations for Other Services       Functional Status Assessment Patient has had a recent decline in their functional status and demonstrates the ability to make significant improvements in function in a reasonable and predictable amount of time.     Precautions / Restrictions Precautions Precautions: None Precaution Comments: monitor O2 Restrictions Weight Bearing Restrictions: No      Mobility  Bed Mobility Overal bed mobility: Independent             General bed mobility comments:   (ind no assist)    Transfers Overall transfer level: Independent Equipment used: None               General transfer comment: independent, no device    Ambulation/Gait Ambulation/Gait assistance: Independent Gait Distance (Feet): 300 Feet Assistive device: None Gait Pattern/deviations: WFL(Within Functional Limits) Gait velocity: WNl Gait velocity interpretation: >4.37 ft/sec, indicative of normal walking speed   General Gait Details: Gait WNL, good pace, no evidence of instability. SpO2 97% on RA with 10/10 RPE reported, 2/3 DOE, audible wheezing. Pt reports feeling fatigued. Educated on energy conservation, symptom awareness, avoidance of "yo-yo-ing" activites to prevent unnecessary fatiuge.  Stairs            Wheelchair Mobility     Tilt Bed    Modified Rankin (Stroke Patients Only)       Balance Overall balance assessment: Independent                               Standardized Balance Assessment Standardized Balance Assessment : Berg Balance Test Berg Balance Test Sit to Stand: Able to stand without using hands and stabilize independently Standing Unsupported: Able to stand safely 2 minutes Sitting with Back Unsupported but Feet Supported on Floor or Stool: Able to sit safely and securely 2 minutes Stand to Sit: Sits safely with minimal use of hands Transfers: Able to transfer safely, minor use of hands Standing Unsupported with Eyes Closed: Able to stand 10 seconds safely Standing Ubsupported with Feet Together: Able to place feet together independently and stand 1 minute safely From  Standing, Reach Forward with Outstretched Arm: Can reach confidently >25 cm (10") From Standing Position, Pick up Object from Floor: Able to pick up shoe safely and easily From Standing Position, Turn to Look Behind Over each Shoulder: Looks behind from both sides and weight shifts well Turn 360 Degrees: Able to turn 360 degrees safely in 4 seconds or  less Standing Unsupported, Alternately Place Feet on Step/Stool: Able to stand independently and safely and complete 8 steps in 20 seconds Standing Unsupported, One Foot in Front: Able to place foot tandem independently and hold 30 seconds Standing on One Leg: Able to lift leg independently and hold 5-10 seconds Total Score: 55         Pertinent Vitals/Pain Pain Assessment Pain Assessment: No/denies pain    Home Living Family/patient expects to be discharged to:: Private residence Living Arrangements: Alone Available Help at Discharge: Family;Available PRN/intermittently Type of Home: House Home Access: Stairs to enter Entrance Stairs-Rails: Right;Left;Can reach both Entrance Stairs-Number of Steps: 1 at back and 3 at front Alternate Level Stairs-Number of Steps: flight Home Layout: Two level;1/2 bath on main level;Bed/bath upstairs Home Equipment: Cane - single point;BSC/3in1;Grab bars - tub/shower;Wheelchair - manual;Grab bars - toilet;Hand held shower head      Prior Function Prior Level of Function : Independent/Modified Independent             Mobility Comments: Active, no falls, taking culinary classes at [employer]. ADLs Comments: ind     Extremity/Trunk Assessment   Upper Extremity Assessment Upper Extremity Assessment: Defer to OT evaluation    Lower Extremity Assessment Lower Extremity Assessment: Overall WFL for tasks assessed       Communication   Communication Communication: No apparent difficulties  Cognition Arousal: Alert Behavior During Therapy: WFL for tasks assessed/performed Overall Cognitive Status: Within Functional Limits for tasks assessed                                          General Comments General comments (skin integrity, edema, etc.): SpO2 97% ambulating on RA, increased WOB noted with some wheezing    Exercises     Assessment/Plan    PT Assessment Patient does not need any further PT services  PT Problem  List         PT Treatment Interventions      PT Goals (Current goals can be found in the Care Plan section)  Acute Rehab PT Goals Patient Stated Goal: Get well, finish school, 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 PT Goal Formulation: All assessment and education complete, DC therapy    Frequency       Co-evaluation               AM-PAC PT "6 Clicks" Mobility  Outcome Measure Help needed turning from your back to your side while in a flat bed without using bedrails?: None Help needed moving from lying on your back to sitting on the side of a flat bed without using bedrails?: None Help needed moving to and from a bed to a chair (including a wheelchair)?: None Help needed standing up from a chair using your arms (e.g., wheelchair or bedside chair)?: None Help needed to walk in hospital room?: None Help needed climbing 3-5 steps with a railing? : None 6 Click Score: 24    End of Session   Activity Tolerance: Patient tolerated treatment well  Patient left: in bed;with call bell/phone within reach;Other (comment) (Respiratory present for treatment.)   PT Visit Diagnosis: Difficulty in walking, not elsewhere classified (R26.2)    Time: 4098-1191 PT Time Calculation (min) (ACUTE ONLY): 9 min   Charges:   PT Evaluation $PT Eval Low Complexity: 1 Low   PT General Charges $$ ACUTE PT VISIT: 1 Visit         Kathlyn Sacramento, PT, DPT [facility]  Rehabilitation Services Physical Therapist Office: [phone] Website: [email or web address]   Berton Mount 09/06/2023, 4:11 PM

## 2023-09-06 NOTE — Plan of Care (Signed)

## 2023-09-07 ENCOUNTER — Other Ambulatory Visit (HOSPITAL_COMMUNITY): Payer: Self-pay

## 2023-09-07 DIAGNOSIS — E119 Type 2 diabetes mellitus without complications: Secondary | ICD-10-CM | POA: Diagnosis not present

## 2023-09-07 DIAGNOSIS — F17210 Nicotine dependence, cigarettes, uncomplicated: Secondary | ICD-10-CM | POA: Diagnosis not present

## 2023-09-07 DIAGNOSIS — J441 Chronic obstructive pulmonary disease with (acute) exacerbation: Secondary | ICD-10-CM | POA: Diagnosis not present

## 2023-09-07 LAB — GLUCOSE, CAPILLARY
Glucose-Capillary: 210 mg/dL — ABNORMAL HIGH (ref 70–99)
Glucose-Capillary: 303 mg/dL — ABNORMAL HIGH (ref 70–99)

## 2023-09-07 MED ORDER — IPRATROPIUM-ALBUTEROL 0.5-2.5 (3) MG/3ML IN SOLN: 3.0000 mL | Freq: Two times a day (BID) | RESPIRATORY_TRACT | Status: DC

## 2023-09-07 MED ORDER — IPRATROPIUM-ALBUTEROL 0.5-2.5 (3) MG/3ML IN SOLN
3.0000 mL | RESPIRATORY_TRACT | 0 refills | Status: DC | PRN
  Filled 2023-09-07: qty 360, 20d supply, fill #0

## 2023-09-07 MED ORDER — PREDNISONE 20 MG PO TABS
40.0000 mg | ORAL_TABLET | Freq: Every day | ORAL | 0 refills | Status: AC
  Filled 2023-09-07: qty 2, 1d supply, fill #0

## 2023-09-07 MED ORDER — GUAIFENESIN ER 600 MG PO TB12
600.0000 mg | ORAL_TABLET | Freq: Two times a day (BID) | ORAL | 0 refills | Status: DC | PRN
  Filled 2023-09-07: qty 60, 30d supply, fill #0

## 2023-09-07 NOTE — Discharge Summary (Signed)
Name: Richard [name] MRN: [id number] DOB: October 04, 1961 62 y.o. PCP: [name], DO  Date of Admission: 09/04/2023  7:52 AM Date of Discharge: 09/07/23 Attending Physician: Dr. Criselda Peaches  Discharge Diagnosis: Active Problems:   Asthma exacerbation    Discharge Medications: Allergies as of 09/07/2023       Reactions   Shellfish Allergy Anaphylaxis, Shortness Of Breath        Medication List     TAKE these medications    albuterol (2.5 MG/3ML) 0.083% nebulizer solution Commonly known as: PROVENTIL Take 3 mLs (2.5 mg total) by nebulization 2 (two) times daily.   albuterol 108 (90 Base) MCG/ACT inhaler Commonly known as: VENTOLIN HFA Inhale 1-2 puffs into the lungs every 4 (four) hours as needed for wheezing or shortness of breath.   aspirin EC 81 MG tablet Take 1 tablet (81 mg total) by mouth daily.   atorvastatin 40 MG tablet Commonly known as: LIPITOR Take 1 tablet (40 mg total) by mouth daily.   Breztri Aerosphere 160-9-4.8 MCG/ACT Aero Generic drug: Budeson-Glycopyrrol-Formoterol Inhale 2 puffs into the lungs in the morning and at bedtime.   clopidogrel 75 MG tablet Commonly known as: Plavix Take 1 tablet (75 mg total) by mouth daily.   ferrous sulfate 325 (65 FE) MG tablet Take 1 tablet (325 mg total) by mouth daily.   fluticasone 50 MCG/ACT nasal spray Commonly known as: FLONASE Place 1 spray into both nostrils daily.   FreeStyle Libre 3 Sensor Misc Place 1 sensor on the skin every 14 days. Use to check glucose continuously   guaiFENesin 600 MG 12 hr tablet Commonly known as: MUCINEX Take 1 tablet (600 mg total) by mouth 2 (two) times daily as needed for to loosen phlegm.   insulin degludec 100 UNIT/ML FlexTouch Pen Commonly known as: TRESIBA Inject 20 Units into the skin daily.   ipratropium-albuterol 0.5-2.5 (3) MG/3ML Soln Commonly known as: DUONEB Take 3 mLs by nebulization every 4 (four) hours as needed (for wheezing and shortness of  breath).   meloxicam 15 MG tablet Commonly known as: MOBIC Take 1 tablet (15 mg total) by mouth daily as needed for pain.   montelukast 10 MG tablet Commonly known as: SINGULAIR Take 1 tablet (10 mg total) by mouth at bedtime.   Nucala 100 MG/ML Soaj Generic drug: Mepolizumab Inject 1 mL (100 mg total) into the skin every 28 (twenty-eight) days.   Pen Needles 32G X 4 MM Misc 1 each by Does not apply route daily.   pioglitazone 30 MG tablet Commonly known as: ACTOS Take 1 tablet (30 mg total) by mouth daily.   predniSONE 20 MG tablet Commonly known as: DELTASONE Take 2 tablets (40 mg total) by mouth daily with breakfast for 1 day. Start taking on: September 08, 2023 What changed: when to take this   pregabalin 100 MG capsule Commonly known as: LYRICA Take 1 capsule (100 mg total) by mouth 3 (three) times daily.   sildenafil 100 MG tablet Commonly known as: Viagra Take 1 tablet (100 mg total) by mouth as needed for erectile dysfunction.   Synjardy 12.02-999 MG Tabs Generic drug: Empagliflozin-metFORMIN HCl Take 1 tablet by mouth in the morning and at bedtime.   triamcinolone ointment 0.1 % Commonly known as: KENALOG Apply 1 Application topically 2 (two) times daily.   Trulicity 4.5 MG/0.5ML Soaj Generic drug: Dulaglutide Inject 4.5 mg as directed once a week.         Disposition and follow-up:   [name]  was discharged from [facility] in Good condition.  At the hospital follow up visit please address:  1.  Follow-up:  a. COPD/Asthma: ensure symptoms improved/resolution; ensure on triple therapy inhaler, Singulair, Nucala and PRN nebulizer; patient has f/u pulmonology on 10/18/23, can consider pulm rehab   2.  Labs / imaging needed at time of follow-up: none  3.  Pending labs/ test needing follow-up: none  4.  Medication Changes  -Prednisone 40 mg x 1 day (complete on 12/1)  -Mucinex 600 mg BID PRN  -Duoneb 3mL Q4H PRN for  wheezing or SOB  Follow-up Appointments:  Follow-up Information     [name], DO Follow up on 09/26/2023.   Specialty: Internal Medicine Why: Hospital follow up Contact information: [address] [place] [address] [phone]         Glenford Bayley, NP Follow up on 10/18/2023.   Specialty: Pulmonary Disease Contact information: [address] [place] [address] [phone]                 Hospital Course by problem list: Richard [name] is a 62 y.o. person living with a history of COPD/asthma overlap, diabetes, PAD who presented with dyspnea and admitted for asthma/COPD exacerbation    COPD/asthma exacerbation Patient presenting with 2 days of worsening dyspnea. He reports adherence with [name] and actually states that in the last month he is gone through his inhalers twice as fast as normal.  He denies recent sick symptoms and is continuing to be successful with tobacco cessation.  Has had increased coughing that is nonproductive. Respiratory viral panel negative for COVID, RSV, and flu.  Unclear what has triggered patient's exacerbation. On chart review this is his fourth admission this year for COPD/asthma exacerbation.  He is already taking triple therapy inhaler at home and has nebulizers. Last PFT completed in March of this year that showed air trapping with bronchodilator response and mild diffusing capacity reduction.  No indication for azithromycin with no productive cough.  During this hospitalization, he was started on prednisone 40 mg for 5 days, continued home inhaler therapy and Duoneb treatments with much improvement. Patient has f/u with [facility] and pulmonology and can discuss pulmonary rehab.   T2DM Last A1c in September was elevated at 8.7.  He wears a freestyle libre at home.  Home medications include Tresiba 20 units nightly, Synjardy, and pioglitazone. Patient to continue his home regimen at discharge.    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  He has had some lapses in the last year, but feels that he is overall successful. Continue outpatient f/u.     Discharge Subjective: Patient reports feeling well this morning.  Denies any shortness of breath or wheezing.  Able to ambulate and states he is ready to go home.  Discussed importance of follow-up with [facility] and [place] pulmonology.  Discharge Exam:   BP 135/74 (BP Location: Left Arm)   Pulse 100   Temp 98.7 F (37.1 C) (Oral)   Resp 20   Ht 6\' 3"  (1.905 m)   Wt 104.4 kg   SpO2 98%   BMI 28.77 kg/m  Constitutional: Well-appearing male laying in bed comfortably, in no acute distress HENT: normocephalic atraumatic Cardiovascular: RRR Pulmonary/Chest: Normal work of breathing on room air, lungs clear to auscultation bilaterally, no wheezing or crackles Neurological: alert & oriented x 3 Psych: Pleasant mood  Pertinent  Labs, Studies, and Procedures:     Latest Ref Rng & Units 09/05/2023    4:09 AM 09/04/2023    8:03 AM 07/19/2023   11:31 PM  CBC  WBC 4.0 - 10.5 K/uL 6.2  4.0    Hemoglobin 13.0 - 17.0 g/dL 87.5  64.3  32.9   Hematocrit 39.0 - 52.0 % 42.9  45.7  43.0   Platelets 150 - 400 K/uL 244  265         Latest Ref Rng & Units 09/06/2023    5:24 AM 09/05/2023    4:09 AM 09/04/2023    8:03 AM  CMP  Glucose 70 - 99 mg/dL 518  841  660   BUN 8 - 23 mg/dL 21  18  12    Creatinine 0.61 - 1.24 mg/dL 6.30  1.60  1.09   Sodium 135 - 145 mmol/L 139  133  136   Potassium 3.5 - 5.1 mmol/L 4.6  4.7  4.4   Chloride 98 - 111 mmol/L 107  104  103   CO2 22 - 32 mmol/L 26  21  23    Calcium 8.9 - 10.3 mg/dL 8.9  8.8  9.3   Total Protein 6.5 - 8.1 g/dL   7.1   Total Bilirubin <1.2 mg/dL   0.6   Alkaline Phos 38 - 126 U/L   81   AST 15 - 41 U/L   20   ALT 0 - 44 U/L   21     DG Chest Port 1 View  Result Date: 09/04/2023 CLINICAL DATA:  Cough.  Shortness of breath. EXAM:  PORTABLE CHEST 1 VIEW COMPARISON:  07/19/2023. FINDINGS: Bilateral lung fields are clear. Bilateral costophrenic angles are clear. Stable cardio-mediastinal silhouette. No acute osseous abnormalities. The soft tissues are within normal limits. IMPRESSION: No active disease. Electronically Signed   By: Jules Schick M.D.   On: 09/04/2023 08:23     Discharge Instructions: Discharge Instructions     Call MD for:  difficulty breathing, headache or visual disturbances   Complete by: As directed    Call MD for:  extreme fatigue   Complete by: As directed    Call MD for:  hives   Complete by: As directed    Call MD for:  persistant dizziness or light-headedness   Complete by: As directed    Call MD for:  persistant nausea and vomiting   Complete by: As directed    Call MD for:  severe uncontrolled pain   Complete by: As directed    Call MD for:  temperature >100.4   Complete by: As directed    Diet - low sodium heart healthy   Complete by: As directed    Discharge instructions   Complete by: As directed    You came to the hospital for asthma/COPD exacerbation. We treated you with breathing treatments and steroids with improvement. I sent in Duonebs as needed since you felt that has helped more than just albuterol nebulizer by itself. You have follow up appointments with [facility] and [facility] on 10/18/23.    1. Asthma/COPD exacerbation  - FINISH prednisone 40 mg on 12/1, take 2 of the 20 mg tablets (40 mg total) by mouth daily with breakfast for 1 day (09/08/23).  - Continue your triple therapy inhaler, Breztri twice daily, and Singulair daily and Nucala injection - Duoneb (ipratropium-albuterol) 3mL nebulization every 4 hours as needed for wheezing or shortness of  breath  - Mucinex 600 mg twice daily as needed to loosen phlegm - Follow up with your pulmonologist in January, consider pulmonary rehabilitation if needed   2. Diabetes   -Continue your home medication Synjardy and pioglitazone -Continue your Tresiba 20 units nightly -Continue your Lyrica 100 mg 3 times daily   If you have any of these following symptoms, please call us or seek care at an emergency department: Chest Pain, Difficulty Breathing, Worsening abdominal pain, Syncope (passing out), Drooping of face, Slurred speech, Sudden weakness in your leg or arm, Fever, Blood in the stool   If you have any questions or concerns, call our clinic at [phone] or after hours call [phone] and ask for the internal medicine resident on call. I am glad you are feeling better. It was a pleasure taking care for you. I wish a good recovery and good health!   Increase activity slowly   Complete by: As directed        Signed: Rana Snare, DO 09/07/2023, 11:39 AM   Pager: [phone]

## 2023-09-07 NOTE — Care Management (Signed)
Spoke w patient to confirm that he has nebulizer at home. No other needs identified for DC at this time

## 2023-09-09 ENCOUNTER — Telehealth: Payer: Self-pay

## 2023-09-09 ENCOUNTER — Other Ambulatory Visit: Payer: Self-pay | Admitting: Student

## 2023-09-09 DIAGNOSIS — I739 Peripheral vascular disease, unspecified: Secondary | ICD-10-CM

## 2023-09-09 NOTE — Telephone Encounter (Signed)
Next appt scheduled 12/19 with PCP.  

## 2023-09-09 NOTE — Transitions of Care (Post Inpatient/ED Visit) (Signed)
09/09/2023  Name: Richard [name] MRN: [id number] DOB: 1961/05/17  Today's TOC FU Call Status: Today's TOC FU Call Status:: Successful TOC FU Call Completed TOC FU Call Complete Date: 09/09/23 Patient's Name and Date of Birth confirmed.  Transition Care Management Follow-up Telephone Call Date of Discharge: 09/07/23 Discharge Facility: [facility]) Type of Discharge: Inpatient Admission Primary Inpatient Discharge Diagnosis:: Asthma Exacerbation How have you been since you were released from the hospital?: Better Any questions or concerns?: No  Items Reviewed: Did you receive and understand the discharge instructions provided?: Yes Medications obtained,verified, and reconciled?: Yes (Medications Reviewed) Any new allergies since your discharge?: No Dietary orders reviewed?: No Do you have support at home?: Yes People in Home: child(ren), adult Name of Support/Comfort Primary Source: Noell- faughter  Medications Reviewed Today: Medications Reviewed Today     Reviewed by Jodelle Gross, RN (Case Manager) on 09/09/23 at 1337  Med List Status: <None>   Medication Order Taking? Sig Documenting Provider Last Dose Status Informant  albuterol (PROVENTIL) (2.5 MG/3ML) 0.083% nebulizer solution [id number] Yes Take 3 mLs (2.5 mg total) by nebulization 2 (two) times daily. [name], DO Taking Active Self, Pharmacy Records  albuterol (VENTOLIN HFA) 108 (90 Base) MCG/ACT inhaler [id number] Yes Inhale 1-2 puffs into the lungs every 4 (four) hours as needed for wheezing or shortness of breath. [name], MD Taking Active Self, Pharmacy Records  aspirin EC 81 MG EC tablet [id number] Yes Take 1 tablet (81 mg total) by mouth daily. [name], MD Taking Active Self, Pharmacy Records  atorvastatin (LIPITOR) 40 MG tablet [id number] Yes Take 1 tablet (40 mg total) by mouth daily. Marolyn Haller, MD Taking Active Self, Pharmacy Records  Budeson-Glycopyrrol-Formoterol [facility] AEROSPHERE)  160-9-4.8 MCG/ACT [name] [id number] Yes Inhale 2 puffs into the lungs in the morning and at bedtime. [name], MD Taking Active Self, Pharmacy Records  clopidogrel (PLAVIX) 75 MG tablet [id number] Yes Take 1 tablet (75 mg total) by mouth daily. [name], MD Taking Active Self, Pharmacy Records  Continuous Glucose Sensor (FREESTYLE LIBRE 3 [place]) [place] [id number] Yes Place 1 sensor on the skin every 14 days. Use to check glucose continuously [name], DO Taking Active Self, Pharmacy Records  Dulaglutide (TRULICITY) 4.5 MG/0.5ML SOPN [id number] Yes Inject 4.5 mg as directed once a week. [name], DO Taking Active Self, Pharmacy Records           Med Note Neil Crouch, SEBASTIAN   Wed Sep 04, 2023  4:01 PM) Patient reports he takes on the weekends, either Sat/Sun depending on his schedule  Empagliflozin-metFORMIN HCl (SYNJARDY) 12.02-999 MG TABS [id number] Yes Take 1 tablet by mouth in the morning and at bedtime. [name], MD Taking Active Self, Pharmacy Records  ferrous sulfate 325 (65 FE) MG tablet [id number] Yes Take 1 tablet (325 mg total) by mouth daily. [name], DO Taking Active Self, Pharmacy Records  fluticasone [facility]) 50 MCG/ACT nasal spray [id number] Yes Place 1 spray into both nostrils daily. [name], DO Taking Active Self, Pharmacy Records  guaiFENesin (MUCINEX) 600 MG 12 hr tablet [id number] Yes Take 1 tablet (600 mg total) by mouth 2 (two) times daily as needed for to loosen phlegm. Rana Snare, DO Taking Active   insulin degludec (TRESIBA) 100 UNIT/ML FlexTouch Pen [id number] Yes Inject 20 Units into the skin daily. [name], DO Taking Active Self, Pharmacy Records  Insulin Pen Needle (PEN NEEDLES) 32G X 4 MM MISC [id number] Yes 1 each by Does not  apply route daily. [name], DO Taking Active Self, Pharmacy Records  ipratropium-albuterol (DUONEB) 0.5-2.5 (3) MG/3ML SOLN [id number] Yes Take 3 mLs by nebulization every 4 (four) hours as needed (for  wheezing and shortness of breath). Rana Snare, DO Taking Active   meloxicam (MOBIC) 15 MG tablet [id number] Yes Take 1 tablet (15 mg total) by mouth daily as needed for pain. [name], MD Taking Active Self, Pharmacy Records  Mepolizumab [facility]) 100 MG/ML Ivory Broad [id number] Yes Inject 1 mL (100 mg total) into the skin every 28 (twenty-eight) days. [name], MD Taking Active Self, Pharmacy Records           Med Note Neil Crouch, SEBASTIAN   Wed Sep 04, 2023  4:02 PM) Patient reports he took several days ago  montelukast (SINGULAIR) 10 MG tablet [id number] Yes Take 1 tablet (10 mg total) by mouth at bedtime. [name], MD Taking Active Self, Pharmacy Records  pioglitazone (ACTOS) 30 MG tablet [id number] Yes Take 1 tablet (30 mg total) by mouth daily. [name], MD Taking Active Self, Pharmacy Records  predniSONE (DELTASONE) 20 MG tablet [id number] Yes Take 2 tablets (40 mg total) by mouth daily with breakfast for 1 day. Rana Snare, DO Taking Active   pregabalin (LYRICA) 100 MG capsule [id number] Yes Take 1 capsule (100 mg total) by mouth 3 (three) times daily. [name], DO Taking Active Self, Pharmacy Records  sildenafil (VIAGRA) 100 MG tablet [id number] No Take 1 tablet (100 mg total) by mouth as needed for erectile dysfunction. [name], DO Unknown Active Self, Pharmacy Records  triamcinolone ointment (KENALOG) 0.1 % [id number] No Apply 1 Application topically 2 (two) times daily.  Patient not taking: Reported on 09/04/2023   [name], DO Not Taking Active Self, Pharmacy Records           Med Note Bella Kennedy Sep 04, 2023  4:03 PM) Patient has supply at home, no current need/use            Home Care and Equipment/Supplies: Were Home Health Services Ordered?: No Any new equipment or medical supplies ordered?: No  Functional Questionnaire: Do you need assistance with bathing/showering or dressing?: No Do you need assistance with meal preparation?:  No Do you need assistance with eating?: No Do you have difficulty maintaining continence: No Do you need assistance with getting out of bed/getting out of a chair/moving?: No Do you have difficulty managing or taking your medications?: No  Follow up appointments reviewed: PCP Follow-up appointment confirmed?: Yes Date of PCP follow-up appointment?: 09/26/23 Follow-up Provider: Dr. Mikey [facility] Follow-up appointment confirmed?: Yes Date of Specialist follow-up appointment?: 09/17/23 Follow-Up Specialty Provider:: Ames Dura (Pulmonology) Do you need transportation to your follow-up appointment?: No Do you understand care options if your condition(s) worsen?: Yes-patient verbalized understanding  SDOH Interventions Today    Flowsheet Row Most Recent Value  SDOH Interventions   Food Insecurity Interventions Intervention Not Indicated  Housing Interventions Intervention Not Indicated  Transportation Interventions Intervention Not Indicated  Utilities Interventions Intervention Not Indicated      Jodelle Gross RN, BSN, CCM RN Care Manager  Transitions of Care  [facility] Population [facility]  [phone]

## 2023-09-10 ENCOUNTER — Ambulatory Visit (INDEPENDENT_AMBULATORY_CARE_PROVIDER_SITE_OTHER): Payer: 59

## 2023-09-10 ENCOUNTER — Other Ambulatory Visit (HOSPITAL_COMMUNITY): Payer: Self-pay | Admitting: Pharmacy Technician

## 2023-09-10 ENCOUNTER — Other Ambulatory Visit (HOSPITAL_COMMUNITY): Payer: Self-pay

## 2023-09-10 ENCOUNTER — Encounter: Payer: Self-pay | Admitting: Podiatry

## 2023-09-10 ENCOUNTER — Ambulatory Visit (INDEPENDENT_AMBULATORY_CARE_PROVIDER_SITE_OTHER): Payer: 59 | Admitting: Podiatry

## 2023-09-10 DIAGNOSIS — L97501 Non-pressure chronic ulcer of other part of unspecified foot limited to breakdown of skin: Secondary | ICD-10-CM

## 2023-09-10 DIAGNOSIS — M79674 Pain in right toe(s): Secondary | ICD-10-CM | POA: Diagnosis not present

## 2023-09-10 DIAGNOSIS — E1140 Type 2 diabetes mellitus with diabetic neuropathy, unspecified: Secondary | ICD-10-CM

## 2023-09-10 DIAGNOSIS — M79675 Pain in left toe(s): Secondary | ICD-10-CM | POA: Diagnosis not present

## 2023-09-10 DIAGNOSIS — B351 Tinea unguium: Secondary | ICD-10-CM

## 2023-09-10 NOTE — Progress Notes (Unsigned)
This patient returns to my office for at risk foot care.  This patient requires this care by a professional since this patient will be at risk due to having diabetic neuropathy and amputation  fouth and fifth ray left foot and PAD.  This patient is unable to cut nails himself since the patient cannot reach his nails.These nails are painful walking and wearing shoes.  He says he has also developed an open wound on his third toe left foot. He says he has been caring for thie open wound for weeks.  He denies any drainage from third toe left foot.  This patient presents for at risk foot care today care of his open wound.  General Appearance  Alert, conversant and in no acute stress.  Vascular  Dorsalis pedis and posterior tibial  pulses are palpable  bilaterally.  Capillary return is within normal limits  bilaterally. Temperature is within normal limits  bilaterally.  Neurologic  Senn-Weinstein monofilament wire test diminished/absent  bilaterally. Muscle power within normal limits bilaterally.  Nails Thick disfigured discolored nails with subungual debris  from hallux to fifth toes right foot and 1-3 left foot.. No evidence of bacterial infection or drainage bilaterally.  Orthopedic  No limitations of motion  feet .  No crepitus or effusions noted.  Hammer toes third toe left foot. Amputation 4,5 ray left foot.    Skin  normotropic skin with no porokeratosis noted bilaterally.  Open wound third toe left foot at level of PIPJ third toe left foot.  The ulcer appears with clean tissue in absence of skin covering.  No drainage noted  third toe  left foot.  Callus at amputation site left foot on the lateral aspect third toe left foot. Healed ulcer on plantar aspect right foot.  Onychomycosis  Pain in right toes  Pain in left toes  Healing ulcer third toe left foot.  Consent was obtained for treatment procedures.   Mechanical debridement of nails 1-5  right foot and 1-3 left foot. performed with a nail  nipper.  Filed with dremel without incident.Third toe was xrayed with no evidence of bone infection at PIPJ.    The wound was bandaged with neosporin/DSD and coban.  Told patient to continue home bandaging and self care.  RTC 2 weeks.   Patient was told to return to the office for further evaluation if the condition worsens.   Return office visit    2   weeks                  Told patient to return for periodic foot care and evaluation due to potential at risk complications.   Helane Gunther DPM

## 2023-09-10 NOTE — Progress Notes (Signed)
Specialty Pharmacy Refill Coordination Note  Richard [name] is a 62 y.o. male contacted today regarding refills of specialty medication(s) Mepolizumab   Patient requested Delivery   Delivery date: 09/17/23   Verified address: [address] [place]   Medication will be filled on 09/16/23.

## 2023-09-16 ENCOUNTER — Other Ambulatory Visit: Payer: Self-pay

## 2023-09-16 ENCOUNTER — Encounter: Payer: Self-pay | Admitting: Podiatry

## 2023-09-16 ENCOUNTER — Telehealth: Payer: Self-pay | Admitting: Podiatry

## 2023-09-16 ENCOUNTER — Telehealth: Payer: Self-pay | Admitting: Urology

## 2023-09-16 ENCOUNTER — Ambulatory Visit (INDEPENDENT_AMBULATORY_CARE_PROVIDER_SITE_OTHER): Payer: 59 | Admitting: Podiatry

## 2023-09-16 ENCOUNTER — Ambulatory Visit (INDEPENDENT_AMBULATORY_CARE_PROVIDER_SITE_OTHER): Payer: 59

## 2023-09-16 VITALS — Ht 76.0 in | Wt 230.0 lb

## 2023-09-16 DIAGNOSIS — M7989 Other specified soft tissue disorders: Secondary | ICD-10-CM

## 2023-09-16 MED ORDER — DOXYCYCLINE HYCLATE 100 MG PO TABS: 100.0000 mg | ORAL_TABLET | Freq: Two times a day (BID) | ORAL | 1 refills | Status: DC

## 2023-09-16 NOTE — Telephone Encounter (Signed)
DOS-09/18/23  AMPUTATION 3RD LT- 28820  [employer] EFFECTIVE DATE- 05/09/23  DEDUCTIBLE- $240.00 WITH REMAINING $0.00 OOP-$8850.00 WITH REMAINING $3348.35 COINSURANCE- 20%  SPOKE WITH REI T. FROM [facility] AND SHE STATED THAT PRIOR AUTH IS NOT REQUIRED FOR CPT CODE 77824.  CALL REFERENCE NUMBER: [id number]

## 2023-09-16 NOTE — Telephone Encounter (Signed)
I spoke with pt about the importance about the medication being stop with in the amount of days before sx. I gave him the paper with that information as well. Told pt that if he had any questions to call the office.

## 2023-09-17 ENCOUNTER — Emergency Department (HOSPITAL_COMMUNITY): Payer: 59

## 2023-09-17 ENCOUNTER — Other Ambulatory Visit: Payer: Self-pay

## 2023-09-17 ENCOUNTER — Telehealth: Payer: Self-pay | Admitting: Urology

## 2023-09-17 ENCOUNTER — Encounter (HOSPITAL_COMMUNITY): Payer: Self-pay

## 2023-09-17 ENCOUNTER — Inpatient Hospital Stay (HOSPITAL_COMMUNITY)
Admission: EM | Admit: 2023-09-17 | Discharge: 2023-09-22 | DRG: 617 | Disposition: A | Discharge status: Home / Self Care | Payer: 59 | Attending: Internal Medicine | Admitting: Internal Medicine

## 2023-09-17 DIAGNOSIS — M19072 Primary osteoarthritis, left ankle and foot: Secondary | ICD-10-CM | POA: Diagnosis not present

## 2023-09-17 DIAGNOSIS — L97524 Non-pressure chronic ulcer of other part of left foot with necrosis of bone: Secondary | ICD-10-CM

## 2023-09-17 DIAGNOSIS — E1169 Type 2 diabetes mellitus with other specified complication: Principal | ICD-10-CM | POA: Diagnosis present

## 2023-09-17 DIAGNOSIS — Z8614 Personal history of Methicillin resistant Staphylococcus aureus infection: Secondary | ICD-10-CM

## 2023-09-17 DIAGNOSIS — E1151 Type 2 diabetes mellitus with diabetic peripheral angiopathy without gangrene: Secondary | ICD-10-CM | POA: Diagnosis not present

## 2023-09-17 DIAGNOSIS — Z7984 Long term (current) use of oral hypoglycemic drugs: Secondary | ICD-10-CM | POA: Diagnosis not present

## 2023-09-17 DIAGNOSIS — Z83511 Family history of glaucoma: Secondary | ICD-10-CM

## 2023-09-17 DIAGNOSIS — Z794 Long term (current) use of insulin: Secondary | ICD-10-CM | POA: Diagnosis not present

## 2023-09-17 DIAGNOSIS — M869 Osteomyelitis, unspecified: Principal | ICD-10-CM | POA: Diagnosis present

## 2023-09-17 DIAGNOSIS — Z791 Long term (current) use of non-steroidal anti-inflammatories (NSAID): Secondary | ICD-10-CM

## 2023-09-17 DIAGNOSIS — Z7982 Long term (current) use of aspirin: Secondary | ICD-10-CM

## 2023-09-17 DIAGNOSIS — I2510 Atherosclerotic heart disease of native coronary artery without angina pectoris: Secondary | ICD-10-CM | POA: Diagnosis not present

## 2023-09-17 DIAGNOSIS — E11621 Type 2 diabetes mellitus with foot ulcer: Secondary | ICD-10-CM | POA: Diagnosis present

## 2023-09-17 DIAGNOSIS — I739 Peripheral vascular disease, unspecified: Secondary | ICD-10-CM

## 2023-09-17 DIAGNOSIS — J4489 Other specified chronic obstructive pulmonary disease: Secondary | ICD-10-CM | POA: Diagnosis present

## 2023-09-17 DIAGNOSIS — M86172 Other acute osteomyelitis, left ankle and foot: Secondary | ICD-10-CM | POA: Diagnosis present

## 2023-09-17 DIAGNOSIS — M009 Pyogenic arthritis, unspecified: Secondary | ICD-10-CM | POA: Diagnosis present

## 2023-09-17 DIAGNOSIS — Z8249 Family history of ischemic heart disease and other diseases of the circulatory system: Secondary | ICD-10-CM | POA: Diagnosis not present

## 2023-09-17 DIAGNOSIS — M1990 Unspecified osteoarthritis, unspecified site: Secondary | ICD-10-CM | POA: Diagnosis present

## 2023-09-17 DIAGNOSIS — Z89432 Acquired absence of left foot: Secondary | ICD-10-CM | POA: Diagnosis not present

## 2023-09-17 DIAGNOSIS — Z79899 Other long term (current) drug therapy: Secondary | ICD-10-CM

## 2023-09-17 DIAGNOSIS — I10 Essential (primary) hypertension: Secondary | ICD-10-CM | POA: Diagnosis present

## 2023-09-17 DIAGNOSIS — E1165 Type 2 diabetes mellitus with hyperglycemia: Secondary | ICD-10-CM | POA: Diagnosis present

## 2023-09-17 DIAGNOSIS — L03116 Cellulitis of left lower limb: Secondary | ICD-10-CM | POA: Diagnosis present

## 2023-09-17 DIAGNOSIS — Z91013 Allergy to seafood: Secondary | ICD-10-CM

## 2023-09-17 DIAGNOSIS — M79672 Pain in left foot: Secondary | ICD-10-CM | POA: Diagnosis not present

## 2023-09-17 DIAGNOSIS — Z89422 Acquired absence of other left toe(s): Secondary | ICD-10-CM | POA: Diagnosis not present

## 2023-09-17 DIAGNOSIS — Z91148 Patient's other noncompliance with medication regimen for other reason: Secondary | ICD-10-CM

## 2023-09-17 DIAGNOSIS — M79673 Pain in unspecified foot: Secondary | ICD-10-CM | POA: Diagnosis not present

## 2023-09-17 DIAGNOSIS — Z87891 Personal history of nicotine dependence: Secondary | ICD-10-CM

## 2023-09-17 DIAGNOSIS — F419 Anxiety disorder, unspecified: Secondary | ICD-10-CM | POA: Diagnosis present

## 2023-09-17 DIAGNOSIS — D509 Iron deficiency anemia, unspecified: Secondary | ICD-10-CM | POA: Diagnosis not present

## 2023-09-17 DIAGNOSIS — Z7985 Long-term (current) use of injectable non-insulin antidiabetic drugs: Secondary | ICD-10-CM

## 2023-09-17 DIAGNOSIS — M86672 Other chronic osteomyelitis, left ankle and foot: Secondary | ICD-10-CM | POA: Diagnosis not present

## 2023-09-17 DIAGNOSIS — E1142 Type 2 diabetes mellitus with diabetic polyneuropathy: Secondary | ICD-10-CM | POA: Diagnosis present

## 2023-09-17 DIAGNOSIS — L02612 Cutaneous abscess of left foot: Secondary | ICD-10-CM | POA: Diagnosis present

## 2023-09-17 DIAGNOSIS — Z7902 Long term (current) use of antithrombotics/antiplatelets: Secondary | ICD-10-CM

## 2023-09-17 DIAGNOSIS — L97529 Non-pressure chronic ulcer of other part of left foot with unspecified severity: Secondary | ICD-10-CM | POA: Diagnosis present

## 2023-09-17 DIAGNOSIS — M8610 Other acute osteomyelitis, unspecified site: Secondary | ICD-10-CM | POA: Diagnosis not present

## 2023-09-17 DIAGNOSIS — E785 Hyperlipidemia, unspecified: Secondary | ICD-10-CM | POA: Diagnosis present

## 2023-09-17 DIAGNOSIS — L859 Epidermal thickening, unspecified: Secondary | ICD-10-CM | POA: Diagnosis not present

## 2023-09-17 DIAGNOSIS — M7989 Other specified soft tissue disorders: Secondary | ICD-10-CM | POA: Diagnosis not present

## 2023-09-17 DIAGNOSIS — Z825 Family history of asthma and other chronic lower respiratory diseases: Secondary | ICD-10-CM

## 2023-09-17 DIAGNOSIS — E1140 Type 2 diabetes mellitus with diabetic neuropathy, unspecified: Secondary | ICD-10-CM | POA: Diagnosis not present

## 2023-09-17 LAB — I-STAT CG4 LACTIC ACID, ED: Lactic Acid, Venous: 0.8 mmol/L (ref 0.5–1.9)

## 2023-09-17 LAB — CBC WITH DIFFERENTIAL/PLATELET
Abs Immature Granulocytes: 0.06 10*3/uL (ref 0.00–0.07)
Basophils Absolute: 0 10*3/uL (ref 0.0–0.1)
Basophils Relative: 1 %
Eosinophils Absolute: 0.1 10*3/uL (ref 0.0–0.5)
Eosinophils Relative: 1 %
HCT: 40.5 % (ref 39.0–52.0)
Hemoglobin: 12.6 g/dL — ABNORMAL LOW (ref 13.0–17.0)
Immature Granulocytes: 1 %
Lymphocytes Relative: 9 %
Lymphs Abs: 0.7 10*3/uL (ref 0.7–4.0)
MCH: 25.6 pg — ABNORMAL LOW (ref 26.0–34.0)
MCHC: 31.1 g/dL (ref 30.0–36.0)
MCV: 82.3 fL (ref 80.0–100.0)
Monocytes Absolute: 0.7 10*3/uL (ref 0.1–1.0)
Monocytes Relative: 9 %
Neutro Abs: 6.2 10*3/uL (ref 1.7–7.7)
Neutrophils Relative %: 79 %
Platelets: 278 10*3/uL (ref 150–400)
RBC: 4.92 MIL/uL (ref 4.22–5.81)
RDW: 14.9 % (ref 11.5–15.5)
WBC: 7.8 10*3/uL (ref 4.0–10.5)
nRBC: 0 % (ref 0.0–0.2)

## 2023-09-17 LAB — BASIC METABOLIC PANEL
Anion gap: 9 (ref 5–15)
BUN: 15 mg/dL (ref 8–23)
CO2: 25 mmol/L (ref 22–32)
Calcium: 9.1 mg/dL (ref 8.9–10.3)
Chloride: 102 mmol/L (ref 98–111)
Creatinine, Ser: 1.18 mg/dL (ref 0.61–1.24)
GFR, Estimated: >60 mL/min (ref >60)
Glucose, Bld: 189 mg/dL — ABNORMAL HIGH (ref 70–99)
Potassium: 4.5 mmol/L (ref 3.5–5.1)
Sodium: 136 mmol/L (ref 135–145)

## 2023-09-17 LAB — GLUCOSE, CAPILLARY: Glucose-Capillary: 178 mg/dL — ABNORMAL HIGH (ref 70–99)

## 2023-09-17 MED ORDER — HYDRALAZINE HCL 20 MG/ML IJ SOLN: 10.0000 mg | Freq: Four times a day (QID) | INTRAMUSCULAR | Status: DC | PRN

## 2023-09-17 MED ORDER — POLYETHYLENE GLYCOL 3350 17 G PO PACK: 17.0000 g | PACK | Freq: Every day | ORAL | Status: DC | PRN

## 2023-09-17 MED ORDER — INSULIN ASPART 100 UNIT/ML IJ SOLN
0.0000 [IU] | Freq: Every day | INTRAMUSCULAR | Status: DC
  Administered 2023-09-19 – 2023-09-20 (×2): 2 [IU] via SUBCUTANEOUS

## 2023-09-17 MED ORDER — ACETAMINOPHEN 650 MG RE SUPP: 650.0000 mg | Freq: Four times a day (QID) | RECTAL | Status: DC | PRN

## 2023-09-17 MED ORDER — MORPHINE SULFATE (PF) 4 MG/ML IV SOLN
4.0000 mg | Freq: Once | INTRAVENOUS | Status: AC
  Administered 2023-09-17: 4 mg via INTRAMUSCULAR
  Filled 2023-09-17: qty 1

## 2023-09-17 MED ORDER — ACETAMINOPHEN 325 MG PO TABS: 650.0000 mg | ORAL_TABLET | Freq: Four times a day (QID) | ORAL | Status: DC | PRN

## 2023-09-17 MED ORDER — VANCOMYCIN HCL IN DEXTROSE 1-5 GM/200ML-% IV SOLN
1000.0000 mg | Freq: Once | INTRAVENOUS | Status: DC
  Filled 2023-09-17: qty 200

## 2023-09-17 MED ORDER — BISACODYL 5 MG PO TBEC: 5.0000 mg | DELAYED_RELEASE_TABLET | Freq: Every day | ORAL | Status: DC | PRN

## 2023-09-17 MED ORDER — VANCOMYCIN HCL IN DEXTROSE 1-5 GM/200ML-% IV SOLN
1000.0000 mg | Freq: Once | INTRAVENOUS | Status: AC
  Administered 2023-09-17: 1000 mg via INTRAVENOUS
  Filled 2023-09-17: qty 200

## 2023-09-17 MED ORDER — ATORVASTATIN CALCIUM 40 MG PO TABS
40.0000 mg | ORAL_TABLET | Freq: Every day | ORAL | Status: DC
  Administered 2023-09-17 – 2023-09-22 (×5): 40 mg via ORAL
  Filled 2023-09-17 (×6): qty 1

## 2023-09-17 MED ORDER — SENNA 8.6 MG PO TABS
1.0000 | ORAL_TABLET | Freq: Two times a day (BID) | ORAL | Status: DC
  Administered 2023-09-17 – 2023-09-18 (×2): 8.6 mg via ORAL
  Filled 2023-09-17 (×3): qty 1

## 2023-09-17 MED ORDER — INSULIN ASPART 100 UNIT/ML IJ SOLN
0.0000 [IU] | Freq: Three times a day (TID) | INTRAMUSCULAR | Status: DC
  Administered 2023-09-18 (×2): 2 [IU] via SUBCUTANEOUS
  Administered 2023-09-19: 1 [IU] via SUBCUTANEOUS
  Administered 2023-09-19: 7 [IU] via SUBCUTANEOUS
  Administered 2023-09-19 – 2023-09-20 (×2): 3 [IU] via SUBCUTANEOUS
  Administered 2023-09-20 – 2023-09-21 (×2): 2 [IU] via SUBCUTANEOUS
  Administered 2023-09-21: 5 [IU] via SUBCUTANEOUS
  Administered 2023-09-22: 3 [IU] via SUBCUTANEOUS

## 2023-09-17 MED ORDER — SODIUM CHLORIDE 0.9 % IV SOLN
2.0000 g | Freq: Three times a day (TID) | INTRAVENOUS | Status: DC
  Administered 2023-09-17 – 2023-09-19 (×5): 2 g via INTRAVENOUS
  Filled 2023-09-17 (×5): qty 12.5

## 2023-09-17 MED ORDER — ALBUTEROL SULFATE (2.5 MG/3ML) 0.083% IN NEBU
2.5000 mg | INHALATION_SOLUTION | Freq: Four times a day (QID) | RESPIRATORY_TRACT | Status: DC | PRN
Start: 2023-09-17 — End: 2023-09-18
  Administered 2023-09-18: 2.5 mg via RESPIRATORY_TRACT
  Filled 2023-09-17: qty 3

## 2023-09-17 MED ORDER — VANCOMYCIN HCL 10 G IV SOLR
2250.0000 mg | INTRAVENOUS | Status: DC
  Filled 2023-09-17: qty 22.5

## 2023-09-17 MED ORDER — FERROUS SULFATE 325 (65 FE) MG PO TABS
325.0000 mg | ORAL_TABLET | Freq: Every day | ORAL | Status: DC
Start: 2023-09-17 — End: 2023-09-19
  Administered 2023-09-17: 325 mg via ORAL
  Filled 2023-09-17 (×2): qty 1

## 2023-09-17 MED ORDER — ENOXAPARIN SODIUM 40 MG/0.4ML IJ SOSY
40.0000 mg | PREFILLED_SYRINGE | INTRAMUSCULAR | Status: DC
  Administered 2023-09-17: 40 mg via SUBCUTANEOUS
  Filled 2023-09-17: qty 0.4

## 2023-09-17 MED ORDER — ATORVASTATIN CALCIUM 40 MG PO TABS: 40.0000 mg | ORAL_TABLET | Freq: Every day | ORAL | 3 refills | Status: DC

## 2023-09-17 MED ORDER — PREGABALIN 50 MG PO CAPS
100.0000 mg | ORAL_CAPSULE | Freq: Three times a day (TID) | ORAL | Status: DC
  Administered 2023-09-17 – 2023-09-22 (×13): 100 mg via ORAL
  Filled 2023-09-17 (×10): qty 2
  Filled 2023-09-17: qty 1
  Filled 2023-09-17 (×3): qty 2

## 2023-09-17 MED ORDER — MORPHINE SULFATE (PF) 4 MG/ML IV SOLN
4.0000 mg | Freq: Once | INTRAVENOUS | Status: AC
  Administered 2023-09-17: 4 mg via INTRAVENOUS
  Filled 2023-09-17: qty 1

## 2023-09-17 NOTE — Progress Notes (Signed)
Subjective:   Patient ID: Richard [name], male   DOB: 62 y.o.   MRN: [id number]   HPI Patient states he is getting some swelling from wearing the surgical shoe and he is not sure if his third toe is doing well.  Patient was seen by Dr. Stacie Acres around a week ago   ROS      Objective:  Physical Exam  Neurovascular status intact with patient found to have a dressing on the third toe left and upon removal there is a significant amount of drainage and bone exposure of the proximal phalanx of the third digit.  Patient had amputation of the fourth and fifth toes and unfortunately with bone exposure of the third toe needs to be removed     Assessment:  Damaged third toe left foot that unfortunately is most likely has bone infection given bone exposure and will require removal     Plan:  H&P reviewed and I reviewed x-ray with and I discussed removal.  Due to my schedule I will not be able to do this and I referred to Dr. Harmon Pier who will be able to take care of this for him and patient will have digital removal and it will be done at surgical center and patient will evaluate.  X-rays indicate that there is lucency of the proximal phalanx third digit consistent with osteomyelitis

## 2023-09-17 NOTE — ED Notes (Addendum)
Transport requested

## 2023-09-17 NOTE — Progress Notes (Signed)
Pharmacy Antibiotic Note  Richard [name] is a 62 y.o. male for which pharmacy has been consulted for cefepime and vancomycin dosing for  osteomyelitis .  Patient with a history of T2DM, HTN, HLD, CAD, PAD, neuropathy, diabetic foot ulcer s/p lt 4th and 5th toe amputations. Patient presenting with left toe wound, redness, swelling of the foot. XR with concerns for osteomyelitis.  SCr 1.18 WBC 7.8; LA 0.8; T 98.4; HR 88; RR 17  Plan: Cefepime 2g q8hr  Vancomycin 2000 mg given once in the ED ---will start 2250 mg q24hr (eAUC 489.7) unless change in renal function Monitor WBC, fever, renal function, cultures De-escalate when able Levels at steady state     Temp (24hrs), Avg:98.4 F (36.9 C), Min:98.1 F (36.7 C), Max:98.9 F (37.2 C)  Recent Labs  Lab 09/17/23 0954 09/17/23 1000  WBC 7.8  --   CREATININE 1.18  --   LATICACIDVEN  --  0.8    Estimated Creatinine Clearance: 83.9 mL/min (by C-G formula based on SCr of 1.18 mg/dL).    Allergies  Allergen Reactions   Shellfish Allergy Anaphylaxis and Shortness Of Breath   Microbiology results: Pending  Thank you for allowing pharmacy to be a part of this patient's care.  Delmar Landau, PharmD, BCPS 09/17/2023 4:46 PM ED Clinical Pharmacist -  [phone]

## 2023-09-17 NOTE — ED Notes (Signed)
Patient transported to MRI 

## 2023-09-17 NOTE — ED Notes (Signed)
Pt requesting more pain medication.  Message sent to Dr. Jeraldine Loots.

## 2023-09-17 NOTE — ED Triage Notes (Signed)
Patient complains of worsening pain and swelling to left middle toe. Patient scheduled for amputation tomorrow. Taking antibiotics for same
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
## 2023-09-17 NOTE — Telephone Encounter (Signed)
Pt called stating that he is in a lot of pain and had a fever last night, foot is swollen and red per pt and possible drainage, I advised him to go to the hospital, he stated that he will be going to [facility] ER. I have contacted our doctor on call.

## 2023-09-17 NOTE — ED Notes (Signed)
Admitting RN at bedside.  

## 2023-09-17 NOTE — ED Provider Notes (Addendum)
[place] EMERGENCY DEPARTMENT AT [facility] Provider Note   CSN: [id number] Arrival date & time: 09/17/23  8295     History  Chief Complaint  Patient presents with   foot/toe pain/amputation scheduled tomorrow    Richard [name] is a 62 y.o. male.  With a history of that he had ordered DM2, asthma, hyperlipidemia, hypertension, COPD, peripheral vascular disease, arthritis, CAD, anxiety presenting for evaluation of left foot pain.  Left foot has been in pain for quite some time.  He saw podiatry 1 week ago and surgical amputation was discussed.  He then followed up yesterday and was started on antibiotics.  He was not on antibiotics prior to this.  Yesterday evening his pain got significantly worse.  He reported a fever of 100 Fahrenheit yesterday as well.  States the pain was unbearable today so he came into the emergency department.  He called podiatry prior to arrival.  He denies any fevers at this time.  He has not taken anything for pain prior to arrival.  He denies any right foot pain.  No calf pain.  No nausea or vomiting.  No abdominal pain.  He is short of breath at baseline, no recent worsening.  HPI     Home Medications Prior to Admission medications   Medication Sig Start Date End Date Taking? Authorizing Provider  albuterol (PROVENTIL) (2.5 MG/3ML) 0.083% nebulizer solution Take 3 mLs (2.5 mg total) by nebulization 2 (two) times daily. 04/18/23  Yes [name], DO  albuterol (VENTOLIN HFA) 108 (90 Base) MCG/ACT inhaler Inhale 1-2 puffs into the lungs every 4 (four) hours as needed for wheezing or shortness of breath. 08/01/23  Yes [name], MD  aspirin EC 81 MG EC tablet Take 1 tablet (81 mg total) by mouth daily. 09/26/15  Yes Burns, [name], MD  atorvastatin (LIPITOR) 40 MG tablet Take 1 tablet (40 mg total) by mouth daily. 09/17/23  Yes [name], DO  Budeson-Glycopyrrol-Formoterol (BREZTRI AEROSPHERE) 160-9-4.8 MCG/ACT AERO Inhale 2 puffs into the  lungs in the morning and at bedtime. 12/05/22  Yes [name], MD  clopidogrel (PLAVIX) 75 MG tablet TAKE 1 TABLET(75 MG) BY MOUTH DAILY 09/10/23  Yes [name], DO  Continuous Glucose Sensor (FREESTYLE LIBRE 3 SENSOR) MISC Place 1 sensor on the skin every 14 days. Use to check glucose continuously 06/20/23  Yes [name], DO  doxycycline (VIBRA-TABS) 100 MG tablet Take 1 tablet (100 mg total) by mouth 2 (two) times daily. 09/16/23  Yes Regal, Kirstie Peri, DPM  Dulaglutide (TRULICITY) 4.5 MG/0.5ML SOPN Inject 4.5 mg as directed once a week. 06/18/23  Yes [name], DO  Empagliflozin-metFORMIN HCl (SYNJARDY) 12.02-999 MG TABS Take 1 tablet by mouth in the morning and at bedtime. 08/15/23 02/11/24 Yes [name], MD  ferrous sulfate 325 (65 FE) MG tablet Take 1 tablet (325 mg total) by mouth daily. 06/19/23 06/18/24 Yes Patel, Amar, DO  guaiFENesin (MUCINEX) 600 MG 12 hr tablet Take 1 tablet (600 mg total) by mouth 2 (two) times daily as needed for to loosen phlegm. 09/07/23 10/07/23 Yes Rana Snare, DO  insulin degludec (TRESIBA) 100 UNIT/ML FlexTouch Pen Inject 20 Units into the skin daily. 06/20/23  Yes [name], DO  ipratropium-albuterol (DUONEB) 0.5-2.5 (3) MG/3ML SOLN Take 3 mLs by nebulization every 4 (four) hours as needed (for wheezing and shortness of breath). 09/07/23  Yes Rana Snare, DO  meloxicam (MOBIC) 15 MG tablet Take 1 tablet (15  mg total) by mouth daily as needed for pain. 07/15/23  Yes [name], MD  Mepolizumab (NUCALA) 100 MG/ML SOAJ Inject 1 mL (100 mg total) into the skin every 28 (twenty-eight) days. 08/15/23  Yes [name], MD  montelukast (SINGULAIR) 10 MG tablet Take 1 tablet (10 mg total) by mouth at bedtime. 08/15/23  Yes [name], MD  pioglitazone (ACTOS) 30 MG tablet Take 1 tablet (30 mg total) by mouth daily. 08/15/23  Yes [name], MD  pregabalin (LYRICA) 100 MG capsule Take 1 capsule (100 mg total) by mouth 3 (three) times daily.  04/30/23  Yes [name], DO  fluticasone (FLONASE) 50 MCG/ACT nasal spray Place 1 spray into both nostrils daily. Patient not taking: Reported on 09/17/2023 03/22/23   [name], DO  Insulin Pen Needle (PEN NEEDLES) 32G X 4 MM MISC 1 each by Does not apply route daily. 06/20/23   [name], DO      Allergies    Shellfish allergy    Review of Systems   Review of Systems  Musculoskeletal:  Positive for arthralgias and joint swelling.  Skin:  Positive for color change and wound.  All other systems reviewed and are negative.   Physical Exam Updated Vital Signs BP (!) 168/110 (BP Location: Left Arm)   Pulse 92   Temp 98.1 F (36.7 C) (Oral)   Resp 17   SpO2 100%  Physical Exam Vitals and nursing note reviewed.  Constitutional:      General: He is not in acute distress.    Appearance: He is well-developed.  HENT:     Head: Normocephalic and atraumatic.  Eyes:     Conjunctiva/sclera: Conjunctivae normal.  Cardiovascular:     Rate and Rhythm: Normal rate and regular rhythm.     Heart sounds: No murmur heard. Pulmonary:     Effort: Pulmonary effort is normal. No respiratory distress.     Breath sounds: Wheezing present.  Abdominal:     Palpations: Abdomen is soft.     Tenderness: There is no abdominal tenderness. There is no guarding.  Musculoskeletal:        General: No swelling.     Cervical back: Neck supple.     Comments: DP pulses 2+ bilaterally.  Skin:    General: Skin is warm and dry.     Capillary Refill: Capillary refill takes less than 2 seconds.     Comments: Swelling and warmth to entire entirety of left foot.   Neurological:     Mental Status: He is alert and oriented to person, place, and time.  Psychiatric:        Mood and Affect: Mood normal.     ED Results / Procedures / Treatments   Labs (all labs ordered are listed, but only abnormal results are displayed) Labs Reviewed  BASIC METABOLIC PANEL - Abnormal; Notable for the following  components:      Result Value   Glucose, Bld 189 (*)    All other components within normal limits  CBC WITH DIFFERENTIAL/PLATELET - Abnormal; Notable for the following components:   Hemoglobin 12.6 (*)    MCH 25.6 (*)    All other components within normal limits  BASIC METABOLIC PANEL  CBC  I-STAT CG4 LACTIC ACID, ED  I-STAT CG4 LACTIC ACID, ED    EKG None  Radiology DG Foot 2 Views Left  Result Date: 09/17/2023 CLINICAL DATA:  Known infection. Worsening pain. Left middle toe. Scheduled for amputation tomorrow. EXAM:  LEFT FOOT - 2 VIEW COMPARISON:  Left foot radiographs 09/16/2023, 12/2020 FINDINGS: Redemonstration of amputation of the fourth and fifth digits to the level of the mid metatarsal shafts. Resolution of the prior cortical thickening at the distal lateral shaft of the third metatarsal on 12/07/2020 radiographs, possibly previously cortical inflammatory change. There is again lucency within the distal aspect of the proximal phalanx of the third toe measuring up to approximately 8 mm in longitudinal dimension. There is again linear lucency and fragmentation of the adjacent distal lateral aspect of the proximal phalanx. Moderate third toe soft tissue swelling with apparent wound at the lateral aspect of the proximal third toe raising suspicion for osteomyelitis of the distal aspect of the proximal phalanx. Very mild joint space narrowing and dorsal osteophytosis of the first tarsometatarsal joint. Degenerative osteophytosis at the lateral base of the distal phalanx of the great toe is similar to prior. Moderate to high-grade atherosclerotic calcifications. IMPRESSION: Compared to 09/16/2023: Moderate third toe soft tissue swelling with apparent wound at the lateral aspect of the proximal third toe. Redemonstration of lucency within the distal aspect of the proximal phalanx of the third toe with adjacent lateral cortical fragmentation. Findings are suspicious for osteomyelitis of the  distal aspect of the third toe proximal phalanx. Electronically Signed   By: Neita Garnet M.D.   On: 09/17/2023 11:25   DG Foot 2 Views Left  Result Date: 09/16/2023 Please see detailed radiograph report in office note.   Procedures Procedures    Medications Ordered in ED Medications  ceFEPIme (MAXIPIME) 2 g in sodium chloride 0.9 % 100 mL IVPB (2 g Intravenous New Bag/Given 09/17/23 1739)  vancomycin (VANCOCIN) IVPB 1000 mg/200 mL premix (has no administration in time range)    Followed by  vancomycin (VANCOCIN) IVPB 1000 mg/200 mL premix (has no administration in time range)  insulin aspart (novoLOG) injection 0-9 Units (has no administration in time range)  insulin aspart (novoLOG) injection 0-5 Units (has no administration in time range)  acetaminophen (TYLENOL) tablet 650 mg (has no administration in time range)    Or  acetaminophen (TYLENOL) suppository 650 mg (has no administration in time range)  senna (SENOKOT) tablet 8.6 mg (has no administration in time range)  polyethylene glycol (MIRALAX / GLYCOLAX) packet 17 g (has no administration in time range)  bisacodyl (DULCOLAX) EC tablet 5 mg (has no administration in time range)  albuterol (PROVENTIL) (2.5 MG/3ML) 0.083% nebulizer solution 2.5 mg (has no administration in time range)  hydrALAZINE (APRESOLINE) injection 10 mg (has no administration in time range)  enoxaparin (LOVENOX) injection 40 mg (has no administration in time range)  atorvastatin (LIPITOR) tablet 40 mg (has no administration in time range)  ferrous sulfate tablet 325 mg (has no administration in time range)  pregabalin (LYRICA) capsule 100 mg (has no administration in time range)  morphine (PF) 4 MG/ML injection 4 mg (4 mg Intramuscular Given 09/17/23 0945)  morphine (PF) 4 MG/ML injection 4 mg (4 mg Intramuscular Given 09/17/23 1306)  morphine (PF) 4 MG/ML injection 4 mg (4 mg Intravenous Given 09/17/23 1634)    ED Course/ Medical Decision Making/  A&P Clinical Course as of 09/17/23 1752  Tue Sep 17, 2023  1636 Spoke with podiatry Dr. Annamary Rummage who recommends n.p.o. after midnight, broad-spectrum antibiotics, admit to hospitalist, will bring to the OR tomorrow [AS]  1709 Spoke with hospitalist Dr. Pola Corn who will admit [AS]    Clinical Course User Index [AS] Lula Olszewski Edsel Petrin, PA-C  Medical Decision Making Amount and/or Complexity of Data Reviewed Radiology: ordered.  Risk Prescription drug management. Decision regarding hospitalization.  This patient presents to the ED for concern of left foot pain, this involves an extensive number of treatment options, and is a complaint that carries with it a high risk of complications and morbidity.  The differential diagnosis includes cellulitis, osteomyelitis, DVT  My initial workup includes labs, imaging, pain control  Additional history obtained from: Nursing notes from this visit. Previous records within EMR system office visit with Dr. Charlsie Merles with podiatry yesterday  I ordered, reviewed and interpreted labs which include: CBC, BMP, lactate.  No leukocytosis.  Mild anemia.  Hyperglycemia of 189.  No significant electrolyte derangement.  I ordered imaging studies including x-ray left foot I independently visualized and interpreted imaging which showed osteomyelitis of the distal aspect of the left third proximal phalanx I agree with the radiologist interpretation  Consultations Obtained:  I requested consultation with podiatry,  and discussed lab and imaging findings as well as pertinent plan - they recommend: IV antibiotics, MRI left foot, admit to hospitalist  Consulted with hospitalist Dr. Pola Corn who will admit patient  Afebrile, hemodynamically stable.  62 year old male presenting for evaluation of left foot pain.  Has had pain and swelling to the foot for quite some time.  Follows with podiatry who recommended surgical intervention.  Was  started on antibiotics yesterday.  Pain significantly increased overnight.  Called his podiatry office and was referred to the emergency department.  I discussed this case with podiatry on-call Dr. Annamary Rummage with recommendations as stated above.  Lab workup reassuring.  No leukocytosis or lactic acidosis.  X-ray concerning for osteomyelitis.  MRI ordered.  Broad-spectrum IV antibiotics initiated.  Discussed this plan with the patient who is in agreement.  Stable at the time of admission.  Patient's case discussed with Dr. Renaye Rakers who agrees with plan.   Note: Portions of this report may have been transcribed using voice recognition software. Every effort was made to ensure accuracy; however, inadvertent computerized transcription errors may still be present.         Final Clinical Impression(s) / ED Diagnoses Final diagnoses:  Osteomyelitis of left foot, unspecified type [facility])    Rx / DC Orders ED Discharge Orders     None         Michelle Piper, PA-C 09/17/23 1752    Michelle Piper, PA-C 09/17/23 1752    Terald Sleeper, MD 09/17/23 1754

## 2023-09-17 NOTE — Telephone Encounter (Signed)
Medication sent to pharmacy  

## 2023-09-17 NOTE — ED Notes (Signed)
Elevated pt's foot on blankets and chair for comfort.

## 2023-09-17 NOTE — ED Provider Triage Note (Signed)
Emergency Medicine Provider Triage Evaluation Note  Richard [name] , a 62 y.o. male  was evaluated in triage.  Pt complains of worsening left foot pain, subjective fever.  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  Patient saw his surgeon yesterday scheduled for amputation tomorrow.  Review of Systems  Positive: Pain Negative: Systemic complaints beyond fever  Physical Exam  BP 119/68 (BP Location: Left Arm)   Pulse 91   Temp 98.9 F (37.2 C) (Oral)   Resp 18   SpO2 100%  Gen:   Awake, no distress peaking clearly Resp:  Normal effort no increased work of breathing MSK:   Moves extremities without difficulty left foot with swelling, distal amputation previously of the lateral 2 digits Other:  Awake, alert, pleasantly interactive  Medical Decision Making  Medically screening exam initiated at 9:45 AM.  Appropriate orders placed.  Richard [name] was informed that the remainder of the evaluation will be completed by another provider, this initial triage assessment does not replace that evaluation, and the importance of remaining in the ED until their evaluation is complete.   Richard Munch, MD 09/17/23 [phone]

## 2023-09-18 ENCOUNTER — Inpatient Hospital Stay (HOSPITAL_COMMUNITY): Payer: 59

## 2023-09-18 ENCOUNTER — Encounter (HOSPITAL_COMMUNITY): Payer: Self-pay | Admitting: Internal Medicine

## 2023-09-18 ENCOUNTER — Inpatient Hospital Stay (HOSPITAL_COMMUNITY): Payer: 59 | Admitting: Anesthesiology

## 2023-09-18 ENCOUNTER — Other Ambulatory Visit: Payer: Self-pay

## 2023-09-18 ENCOUNTER — Encounter (HOSPITAL_COMMUNITY)
Admission: EM | Disposition: A | Discharge status: Home / Self Care | Payer: Self-pay | Source: Home / Self Care | Attending: Internal Medicine

## 2023-09-18 DIAGNOSIS — E1169 Type 2 diabetes mellitus with other specified complication: Secondary | ICD-10-CM | POA: Diagnosis not present

## 2023-09-18 DIAGNOSIS — L97524 Non-pressure chronic ulcer of other part of left foot with necrosis of bone: Secondary | ICD-10-CM | POA: Diagnosis not present

## 2023-09-18 DIAGNOSIS — I739 Peripheral vascular disease, unspecified: Secondary | ICD-10-CM | POA: Diagnosis not present

## 2023-09-18 DIAGNOSIS — M869 Osteomyelitis, unspecified: Secondary | ICD-10-CM | POA: Diagnosis not present

## 2023-09-18 DIAGNOSIS — M86172 Other acute osteomyelitis, left ankle and foot: Secondary | ICD-10-CM

## 2023-09-18 HISTORY — PX: TRANSMETATARSAL AMPUTATION: SHX6197

## 2023-09-18 LAB — BASIC METABOLIC PANEL
Anion gap: 8 (ref 5–15)
BUN: 17 mg/dL (ref 8–23)
CO2: 22 mmol/L (ref 22–32)
Calcium: 8.6 mg/dL — ABNORMAL LOW (ref 8.9–10.3)
Chloride: 103 mmol/L (ref 98–111)
Creatinine, Ser: 1.11 mg/dL (ref 0.61–1.24)
GFR, Estimated: >60 mL/min (ref >60)
Glucose, Bld: 216 mg/dL — ABNORMAL HIGH (ref 70–99)
Potassium: 4.2 mmol/L (ref 3.5–5.1)
Sodium: 133 mmol/L — ABNORMAL LOW (ref 135–145)

## 2023-09-18 LAB — GLUCOSE, CAPILLARY
Glucose-Capillary: 196 mg/dL — ABNORMAL HIGH (ref 70–99)
Glucose-Capillary: 161 mg/dL — ABNORMAL HIGH (ref 70–99)
Glucose-Capillary: 93 mg/dL (ref 70–99)
Glucose-Capillary: 104 mg/dL — ABNORMAL HIGH (ref 70–99)
Glucose-Capillary: 94 mg/dL (ref 70–99)

## 2023-09-18 LAB — CBC
HCT: 38.5 % — ABNORMAL LOW (ref 39.0–52.0)
Hemoglobin: 12.2 g/dL — ABNORMAL LOW (ref 13.0–17.0)
MCH: 25.8 pg — ABNORMAL LOW (ref 26.0–34.0)
MCHC: 31.7 g/dL (ref 30.0–36.0)
MCV: 81.4 fL (ref 80.0–100.0)
Platelets: 252 10*3/uL (ref 150–400)
RBC: 4.73 MIL/uL (ref 4.22–5.81)
RDW: 14.6 % (ref 11.5–15.5)
WBC: 5.1 10*3/uL (ref 4.0–10.5)
nRBC: 0 % (ref 0.0–0.2)

## 2023-09-18 LAB — SURGICAL PCR SCREEN
MRSA, PCR: NEGATIVE
Staphylococcus aureus: NEGATIVE

## 2023-09-18 SURGERY — AMPUTATION, FOOT, TRANSMETATARSAL
Anesthesia: Monitor Anesthesia Care | Site: Toe | Laterality: Left

## 2023-09-18 MED ORDER — ORAL CARE MOUTH RINSE: 15.0000 mL | Freq: Once | OROMUCOSAL | Status: AC

## 2023-09-18 MED ORDER — INSULIN ASPART 100 UNIT/ML IJ SOLN: 0.0000 [IU] | INTRAMUSCULAR | Status: DC | PRN

## 2023-09-18 MED ORDER — VANCOMYCIN HCL 1000 MG IV SOLR
INTRAVENOUS | Status: AC
  Filled 2023-09-18: qty 20

## 2023-09-18 MED ORDER — FENTANYL CITRATE (PF) 250 MCG/5ML IJ SOLN
INTRAMUSCULAR | Status: DC | PRN
  Administered 2023-09-18 (×2): 50 ug via INTRAVENOUS

## 2023-09-18 MED ORDER — SODIUM CHLORIDE 0.9 % IV SOLN: INTRAVENOUS | Status: DC

## 2023-09-18 MED ORDER — BUPIVACAINE HCL (PF) 0.5 % IJ SOLN
INTRAMUSCULAR | Status: AC
  Filled 2023-09-18: qty 30

## 2023-09-18 MED ORDER — HYDROMORPHONE HCL 1 MG/ML IJ SOLN
0.5000 mg | INTRAMUSCULAR | Status: DC | PRN
  Administered 2023-09-18 – 2023-09-19 (×6): 0.5 mg via INTRAVENOUS
  Filled 2023-09-18 (×6): qty 0.5

## 2023-09-18 MED ORDER — ENOXAPARIN SODIUM 40 MG/0.4ML IJ SOSY
40.0000 mg | PREFILLED_SYRINGE | INTRAMUSCULAR | Status: DC
  Administered 2023-09-19 – 2023-09-22 (×4): 40 mg via SUBCUTANEOUS
  Filled 2023-09-18 (×4): qty 0.4

## 2023-09-18 MED ORDER — MUPIROCIN 2 % EX OINT
1.0000 | TOPICAL_OINTMENT | Freq: Two times a day (BID) | CUTANEOUS | Status: DC
  Administered 2023-09-18 – 2023-09-22 (×9): 1 via NASAL
  Filled 2023-09-18 (×2): qty 22

## 2023-09-18 MED ORDER — PHENYLEPHRINE 80 MCG/ML (10ML) SYRINGE FOR IV PUSH (FOR BLOOD PRESSURE SUPPORT)
PREFILLED_SYRINGE | INTRAVENOUS | Status: DC | PRN
  Administered 2023-09-18: 160 ug via INTRAVENOUS
  Administered 2023-09-18: 80 ug via INTRAVENOUS
  Administered 2023-09-18: 160 ug via INTRAVENOUS

## 2023-09-18 MED ORDER — LIDOCAINE HCL 2 % IJ SOLN
INTRAMUSCULAR | Status: AC
  Filled 2023-09-18: qty 20

## 2023-09-18 MED ORDER — CHLORHEXIDINE GLUCONATE 0.12 % MT SOLN
OROMUCOSAL | Status: AC
  Administered 2023-09-18: 15 mL
  Filled 2023-09-18: qty 15

## 2023-09-18 MED ORDER — MIDAZOLAM HCL 2 MG/2ML IJ SOLN
INTRAMUSCULAR | Status: DC | PRN
  Administered 2023-09-18: 2 mg via INTRAVENOUS

## 2023-09-18 MED ORDER — VANCOMYCIN HCL 500 MG IV SOLR
INTRAVENOUS | Status: DC | PRN
  Administered 2023-09-18: 500 mg via TOPICAL

## 2023-09-18 MED ORDER — TOBRAMYCIN SULFATE 80 MG/2ML IJ SOLN
INTRAMUSCULAR | Status: DC | PRN
Start: 2023-09-18 — End: 2023-09-18
  Administered 2023-09-18: 120 mg via INTRAMUSCULAR

## 2023-09-18 MED ORDER — CHLORHEXIDINE GLUCONATE 0.12 % MT SOLN
15.0000 mL | Freq: Once | OROMUCOSAL | Status: AC
  Administered 2023-09-18: 15 mL via OROMUCOSAL
  Filled 2023-09-18: qty 15

## 2023-09-18 MED ORDER — ALBUTEROL SULFATE (2.5 MG/3ML) 0.083% IN NEBU
2.5000 mg | INHALATION_SOLUTION | RESPIRATORY_TRACT | Status: DC | PRN
  Administered 2023-09-18 (×4): 2.5 mg via RESPIRATORY_TRACT
  Filled 2023-09-18 (×4): qty 3

## 2023-09-18 MED ORDER — TOBRAMYCIN SULFATE 80 MG/2ML IJ SOLN
INTRAMUSCULAR | Status: AC
  Filled 2023-09-18: qty 2

## 2023-09-18 MED ORDER — FENTANYL CITRATE (PF) 250 MCG/5ML IJ SOLN
INTRAMUSCULAR | Status: AC
  Filled 2023-09-18: qty 5

## 2023-09-18 MED ORDER — MIDAZOLAM HCL 2 MG/2ML IJ SOLN
INTRAMUSCULAR | Status: AC
  Filled 2023-09-18: qty 2

## 2023-09-18 MED ORDER — BUPIVACAINE HCL (PF) 0.5 % IJ SOLN
INTRAMUSCULAR | Status: DC | PRN
  Administered 2023-09-18: 20 mL

## 2023-09-18 MED ORDER — 0.9 % SODIUM CHLORIDE (POUR BTL) OPTIME
TOPICAL | Status: DC | PRN
  Administered 2023-09-18: 1000 mL

## 2023-09-18 MED ORDER — VANCOMYCIN HCL 500 MG IV SOLR
INTRAVENOUS | Status: AC
  Filled 2023-09-18: qty 10

## 2023-09-18 MED ORDER — PROPOFOL 500 MG/50ML IV EMUL
INTRAVENOUS | Status: DC | PRN
  Administered 2023-09-18: 150 ug/kg/min via INTRAVENOUS

## 2023-09-18 MED ORDER — INSULIN GLARGINE-YFGN 100 UNIT/ML ~~LOC~~ SOLN
10.0000 [IU] | Freq: Every day | SUBCUTANEOUS | Status: DC
  Administered 2023-09-18: 10 [IU] via SUBCUTANEOUS
  Filled 2023-09-18 (×2): qty 0.1

## 2023-09-18 MED ORDER — VANCOMYCIN HCL 1250 MG/250ML IV SOLN
1250.0000 mg | Freq: Two times a day (BID) | INTRAVENOUS | Status: DC
Start: 2023-09-18 — End: 2023-09-19
  Administered 2023-09-18 (×2): 1250 mg via INTRAVENOUS
  Filled 2023-09-18 (×3): qty 250

## 2023-09-18 SURGICAL SUPPLY — 41 items
BEADS BIO ARTH CALC SULFAT 5CC (Bone Implant) IMPLANT
BIOBEADS ARTH CALC SULFATE 5CC (Bone Implant) ×1 IMPLANT
BLADE AVERAGE 25X9 (BLADE) IMPLANT
BLADE OSC/SAGITTAL MD 5.5X18 (BLADE) ×1 IMPLANT
BLADE SURG 10 STRL SS (BLADE) ×1 IMPLANT
BLADE SURG 15 STRL LF DISP TIS (BLADE) ×2 IMPLANT
BNDG COHESIVE 3X5 TAN ST LF (GAUZE/BANDAGES/DRESSINGS) ×1 IMPLANT
BNDG ELASTIC 3INX 5YD STR LF (GAUZE/BANDAGES/DRESSINGS) ×1 IMPLANT
BNDG ELASTIC 4INX 5YD STR LF (GAUZE/BANDAGES/DRESSINGS) IMPLANT
BNDG ESMARK 4X9 LF (GAUZE/BANDAGES/DRESSINGS) ×1 IMPLANT
BNDG GAUZE DERMACEA FLUFF 4 (GAUZE/BANDAGES/DRESSINGS) IMPLANT
CHLORAPREP W/TINT 26 (MISCELLANEOUS) ×1 IMPLANT
ELECT REM PT RETURN 9FT ADLT (ELECTROSURGICAL) ×1 IMPLANT
ELECTRODE REM PT RTRN 9FT ADLT (ELECTROSURGICAL) ×1 IMPLANT
GAUZE PAD ABD 8X10 STRL (GAUZE/BANDAGES/DRESSINGS) IMPLANT
GAUZE SPONGE 2X2 STRL 8-PLY (GAUZE/BANDAGES/DRESSINGS) ×2 IMPLANT
GAUZE SPONGE 4X4 12PLY STRL (GAUZE/BANDAGES/DRESSINGS) ×1 IMPLANT
GAUZE STRETCH 2X75IN STRL (MISCELLANEOUS) ×1 IMPLANT
GAUZE XEROFORM 1X8 LF (GAUZE/BANDAGES/DRESSINGS) ×1 IMPLANT
GLOVE BIO SURGEON STRL SZ7.5 (GLOVE) ×1 IMPLANT
GLOVE BIOGEL PI IND STRL 7.5 (GLOVE) ×1 IMPLANT
GOWN STRL REUS W/ TWL LRG LVL3 (GOWN DISPOSABLE) ×2 IMPLANT
KIT BASIN OR (CUSTOM PROCEDURE TRAY) ×1 IMPLANT
NDL BIOPSY JAMSHIDI 8X6 (NEEDLE) IMPLANT
NDL HYPO 25X1 1.5 SAFETY (NEEDLE) ×2 IMPLANT
NEEDLE BIOPSY JAMSHIDI 8X6 (NEEDLE) IMPLANT
NEEDLE HYPO 25X1 1.5 SAFETY (NEEDLE) ×2 IMPLANT
PACK ORTHO EXTREMITY (CUSTOM PROCEDURE TRAY) ×1 IMPLANT
PADDING CAST ABS COTTON 4X4 ST (CAST SUPPLIES) ×2 IMPLANT
SET HNDPC FAN SPRY TIP SCT (DISPOSABLE) IMPLANT
SPIKE FLUID TRANSFER (MISCELLANEOUS) ×2 IMPLANT
STAPLER VISISTAT 35W (STAPLE) IMPLANT
STOCKINETTE 4X48 STRL (DRAPES) ×1 IMPLANT
SUT ETHILON 3 0 FSLX (SUTURE) ×1 IMPLANT
SUT PROLENE 2 0 FS (SUTURE) IMPLANT
SUT PROLENE 4 0 PS 2 18 (SUTURE) ×2 IMPLANT
SYR CONTROL 10ML LL (SYRINGE) ×2 IMPLANT
TUBE CONNECTING 20X1/4 (TUBING) IMPLANT
UNDERPAD 30X36 HEAVY ABSORB (UNDERPADS AND DIAPERS) ×1 IMPLANT
WATER STERILE IRR 1000ML POUR (IV SOLUTION) ×1 IMPLANT
YANKAUER SUCT BULB TIP NO VENT (SUCTIONS) IMPLANT

## 2023-09-18 NOTE — Progress Notes (Signed)
Richard [name] is a 62 y.o. male patient admitted. Awake, alert - oriented  X 4 - no acute distress noted.  VSS - Blood pressure 126/82, pulse 83, temperature 98.2 F (36.8 C), resp. rate 16, height 6\' 4"  (1.93 m), weight 92.7 kg, SpO2 99%.    IV in place, occlusive dsg intact without redness.  Orientation to room, and floor completed.  Admission INP armband ID verified with patient/family, and in place.   SR up x 2, fall assessment complete, with patient and family able to verbalize understanding of risk associated with falls, and verbalized understanding to call nsg before up out of bed.  Call light within reach, patient able to voice, and demonstrate understanding.Patient has 4th and 5th toe amputation of the Left foot. Scheduled for surgery in the afternoon.  Will cont to eval and treat per MD orders.  Lyrick Lagrand, RN 09/18/2023 8:05 AM

## 2023-09-18 NOTE — Progress Notes (Signed)
[facility] Pre-op Review 1.Surgical orders: Consent orders:  Completed Consent signed:  Completed Patient alert and oriented:  Yes Pre-meds:  2.  Pre-procedure checklist completed: Completed by floor RN  3.  NPO:  MN  4.  CHG Bath completed:  Completed by floor RN      Belongings removed and placed in clean gown:  5.  Labs Performed: CBC    Abnormal 09/18/23 CMP/BMP   Abnormal 09/18/23 PT/INR HA1C Type and Screen Surgical PCR:  Negative 09/18/23 Pregnancy:  NA EKG:    09/04/23 Chest x-ray:  09/04/23   6.  Recent H&P or progress note if inpatient: 09/18/23  7.  Language Barrier:  None  8.  Vital Signs:  Stable Oxygen:  RA Tele:  Can the patient travel without Tele  9.  Medications: Cardiac Drips:  Pain Medications:  Beta Blocker:  Anticoagulants:  GLP1: Antibiotic: Cefepime 2 G 0840 Vancomycin 1250mg  1031 10. IV access:  11. Diabetic:     CBG:    161  @      1210   2 UNITS aspart  @ 1219

## 2023-09-18 NOTE — Transfer of Care (Signed)
Immediate Anesthesia Transfer of Care Note  Patient: Richard [name]  Procedure(s) Performed: TRANSMETATARSAL AMPUTATION (Left: Toe)  Patient Location: PACU  Anesthesia Type:MAC  Level of Consciousness: awake, alert , and oriented  Airway & Oxygen Therapy: Patient Spontanous Breathing  Post-op Assessment: Report given to RN and Post -op Vital signs reviewed and stable  Post vital signs: Reviewed and stable  Last Vitals:  Vitals Value Taken Time  BP    Temp    Pulse 76 09/18/23 1835  Resp 17 09/18/23 1835  SpO2 97 % 09/18/23 1835  Vitals shown include unfiled device data.  Last Pain:  Vitals:   09/18/23 1647  TempSrc: Oral  PainSc: 0-No pain         Complications: No notable events documented.

## 2023-09-18 NOTE — Progress Notes (Addendum)
Pharmacy Antibiotic Note  Richard [name] is a 62 y.o. male for which pharmacy has been consulted for cefepime and vancomycin dosing for  osteomyelitis .  Patient with a history of T2DM, HTN, HLD, CAD, PAD, neuropathy, diabetic foot ulcer s/p lt 4th and 5th toe amputations. Patient presenting with left toe wound, redness, swelling of the foot. XR with concerns for osteomyelitis.  SCr 1.11 WBC 5.1; LA 0.8  Vancomycin 1250 mg q12h provides estimated AUC of 513.7. Unclear if patient received full load in ED as 2nd of the linked 1000 mg orders was not documented on the [facility].   Plan: Cefepime 2g q8hr  Vancomycin 1250 mg q12h  Monitor WBC, fever, renal function, cultures De-escalate when able Levels at steady state  Height: 6\' 4"  (193 cm) Weight: 92.7 kg (204 lb 5.9 oz) IBW/kg (Calculated) : 86.8  Temp (24hrs), Avg:98.3 F (36.8 C), Min:98.1 F (36.7 C), Max:98.5 F (36.9 C)  Recent Labs  Lab 09/17/23 0954 09/17/23 1000 09/18/23 0613  WBC 7.8  --  5.1  CREATININE 1.18  --  1.11  LATICACIDVEN  --  0.8  --     Estimated Creatinine Clearance: 82.5 mL/min (by C-G formula based on SCr of 1.11 mg/dL).    Allergies  Allergen Reactions   Shellfish Allergy Anaphylaxis and Shortness Of Breath   Antibiotics  Cefepime 12/10>>  Vancomycin 12/10 >>    Microbiology results: MRSA PCR 12/11 neg   Thank you for allowing pharmacy to be a part of this patient's care.  Cedric Fishman, PharmD, BCPS, BCCCP Clinical Pharmacist

## 2023-09-18 NOTE — Consult Note (Signed)
PODIATRY CONSULTATION  NAME Richard [name] MRN [id number] DOB 1960-12-26 DOA 09/17/2023   Reason for consult:  Chief Complaint  Patient presents with   foot/toe pain/amputation scheduled tomorrow    Attending/Consulting physician: Lucky Cowboy MD  History of present illness: "62 y.o. male with PMH significant for DM2, HTN, HLD, CAD, PAD, neuropathy, diabetic foot ulcer s/p left fourth and fifth toe amputations Patient follows up with podiatry as an outpatient. 12/3, he was seen at podiatry's office for an open wound on the third toe of left foot.  He has not been able to take care of his foot at home affinity to reach to them.  Per office note, x-ray obtained at that time did not show any evidence of bone infection.  Local wound care was given and patient was asked to follow-up in 2 weeks. At home, patient continued to have worsening symptoms.  He had a fever last night.  Left foot became red and swollen with possible drainage.  He called the podiatry office with concerns and was recommended to come to the ED."   Patient discussed with patient recommend patient for transmetatarsal amputation versus third toe amputation due to nonfunctionality of having just the first and second toe on the left foot.  He is in agreement with plan.  Also discussed tendo Achilles lengthening procedure.  All questions were answered.  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   Hyperlipidemia    MRSA infection 09/05/2020   Need for Tdap vaccination  09/19/2018   Neuromuscular disorder (HCC)    neuropathy in feet   No natural teeth    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   Peripheral vascular disease (HCC)    Substance abuse (HCC)    crack - recovered x 30 yrs   Type II diabetes mellitus (HCC)    diagnosed in 2013       Latest Ref Rng & Units 09/18/2023    6:13 AM 09/17/2023    9:54 AM 09/05/2023    4:09 AM  CBC  WBC 4.0 - 10.5 K/uL 5.1  7.8  6.2   Hemoglobin 13.0 - 17.0 g/dL 29.5  18.8  41.6   Hematocrit 39.0 - 52.0 % 38.5  40.5  42.9   Platelets 150 - 400 K/uL 252  278  244        Latest Ref Rng & Units 09/18/2023    6:13 AM 09/17/2023    9:54 AM 09/06/2023    5:24 AM  BMP  Glucose 70 - 99 mg/dL 606  301  601   BUN 8 - 23 mg/dL 17  15  21    Creatinine 0.61 - 1.24 mg/dL 0.93  2.35  5.73   Sodium 135 - 145 mmol/L 133  136  139   Potassium 3.5 - 5.1 mmol/L 4.2  4.5  4.6   Chloride 98 - 111 mmol/L 103  102  107   CO2 22 - 32 mmol/L 22  25  26    Calcium 8.9 - 10.3 mg/dL 8.6  9.1  8.9       Physical Exam: Lower Extremity Exam Vasc: R - PT palpable, DP palpable. Cap refill < 3 sec to digits  L - PT palpable, DP palpable. Cap refill <3 sec to digits  Derm: R - Normal temp/texture/turgor with no open lesion or clinical signs of infection    L -necrotic ulcerations present on the left third toe with erythema and edema of the toe.  Malodor and drainage is present.  MSK:  R -  No gross deformities. Compartments soft, non-tender, compressible  L -prior partial fourth and fifth amputation, amputation site well-healed  Neuro: R - Gross sensation absent. Gross motor function intact   L - Gross sensation absent. Gross motor function intact    ASSESSMENT/PLAN OF CARE 62 y.o. male with PMHx significant for  DM2 HTN HLD CAD PAD neuropathy who is status post prior left fourth and fifth partial ray amputation with osteomyelitis of the left third toe.   -N.p.o. for OR later today for left foot  transmetatarsal amputation, tendo Achilles lengthening - Continue IV abx broad spectrum pending further culture data - Anticoagulation: Hold pending OR - Wound care: No dressing needed, leave surgical dressing clean dry and intact. - WB status: Nonweightbearing to the left foot in cam boot postoperatively - Will continue to follow   Thank you for the consult.  Please contact me directly with any questions or concerns.           [name], DPM [facility]    [address] [place], [place] [address]                Office [phone]  Fax [phone]

## 2023-09-18 NOTE — Anesthesia Preprocedure Evaluation (Addendum)
Anesthesia Evaluation  Patient identified by MRN, date of birth, ID band Patient awake    Reviewed: Allergy & Precautions, H&P , NPO status , Patient's Chart, lab work & pertinent test results  Airway Mallampati: II  TM Distance: >3 FB Neck ROM: Full    Dental no notable dental hx. (+) Edentulous Upper, Edentulous Lower, Dental Advisory Given   Pulmonary shortness of breath, asthma , COPD,  COPD inhaler, former smoker   Pulmonary exam normal breath sounds clear to auscultation       Cardiovascular hypertension, Pt. on medications + CAD and + Peripheral Vascular Disease  + Valvular Problems/Murmurs AS  Rhythm:Regular Rate:Normal     Neuro/Psych   Anxiety     negative neurological ROS     GI/Hepatic negative GI ROS, Neg liver ROS,,,  Endo/Other  diabetes, Type 2, Oral Hypoglycemic Agents    Renal/GU negative Renal ROS  negative genitourinary   Musculoskeletal  (+) Arthritis , Osteoarthritis,    Abdominal   Peds  Hematology  (+) Blood dyscrasia, anemia   Anesthesia Other Findings   Reproductive/Obstetrics negative OB ROS                             Anesthesia Physical Anesthesia Plan  ASA: 3  Anesthesia Plan: MAC   Post-op Pain Management: Minimal or no pain anticipated   Induction: Intravenous  PONV Risk Score and Plan: 1 and Propofol infusion  Airway Management Planned: Natural Airway and Simple Face Mask  Additional Equipment:   Intra-op Plan:   Post-operative Plan:   Informed Consent: I have reviewed the patients History and Physical, chart, labs and discussed the procedure including the risks, benefits and alternatives for the proposed anesthesia with the patient or authorized representative who has indicated his/her understanding and acceptance.       Plan Discussed with: CRNA and Surgeon  Anesthesia Plan Comments:        Anesthesia Quick Evaluation

## 2023-09-18 NOTE — Plan of Care (Signed)
  Problem: Skin Integrity: Goal: Risk for impaired skin integrity will decrease Outcome: Progressing   Problem: Tissue Perfusion: Goal: Adequacy of tissue perfusion will improve Outcome: Progressing   Problem: Fluid Volume: Goal: Ability to maintain a balanced intake and output will improve Outcome: Progressing   Problem: Education: Goal: Ability to describe self-care measures that may prevent or decrease complications (Diabetes Survival Skills Education) will improve Outcome: Progressing   Problem: Health Behavior/Discharge Planning: Goal: Ability to manage health-related needs will improve Outcome: Progressing

## 2023-09-18 NOTE — H&P (Signed)
PROGRESS NOTE  Richard [name]  DOB: 1960-10-26  PCP: [name], DO NWG:[id number]  DOA: 09/17/2023  LOS: 1 day  Hospital Day: 2  Brief narrative: Richard [name] is a 62 y.o. male with PMH significant for DM2, HTN, HLD, CAD, PAD, neuropathy, diabetic foot ulcer s/p left fourth and fifth toe amputations Patient follows up with podiatry as an outpatient. 12/3, he was seen at podiatry's office for an open wound on the third toe of left foot.  He has not been able to take care of his foot at home affinity to reach to them.  Per office note, x-ray obtained at that time did not show any evidence of bone infection.  Local wound care was given and patient was asked to follow-up in 2 weeks. At home, patient continued to have worsening symptoms.  He had a fever last night.  Left foot became red and swollen with possible drainage.  He called the podiatry office with concerns and was recommended to come to the ED.  In the ED, patient was afebrile, hemodynamically stable Blood work with WBC count 7.8, lactic acid level normal, BMP unremarkable Left foot x-ray showed moderate third toe soft tissue swelling with apparent wound at the lateral aspect of the proximal third toe. Redemonstration of lucency within the distal aspect of the proximal phalanx of the third toe with adjacent lateral cortical fragmentation. Findings are suspicious for osteomyelitis of the distal aspect of the third toe proximal phalanx. EDP spoke with podiatrist Dr. Annamary Rummage. Admitted to [facility]  MRI left foot was obtained which showed soft tissue ulceration along the dorsal aspect of third PIP joint with exposed bone. Underlying acute osteomyelitis of the third proximal and middle phalanges and third PIP joint septic arthritis. Small 8 mm abscess along the medial aspect of the third proximal phalanx.  Subjective: Patient was seen and examined this morning.  Pleasant middle-aged African-American male.  Not in distress.  No new  symptoms. Was seen by podiatry this morning.  Noted plan of surgical intervention this afternoon.  Assessment/Plan: Acute osteomyelitis, septic arthritis and abscess of left third toe Left foot cellulitis Presented with left toe wound, redness, swelling of the foot MRI left foot showed osteomyelitis of third proximal and middle phalanges and third PIP joint septic arthritis as well as a small abscess Currently on IV vancomycin Seen by podiatry.  Noted a plan of surgical intervention this afternoon.  Type 2 diabetes mellitus Diabetic neuropathy A1c 8.7 on 06/18/2023 PTA meds-Trulicity weekly, Tresiba 20 units daily, empagliflozin 12.5 mg twice daily, metformin 1000 mg twice daily, Actos 30 mg daily  Patient however states that he has not been compliant to his medications. Currently on SSI with Accu-Cheks only.  Blood sugar level running elevated.  Start insulin Semglee 10 units from tonight. Continue Lyrica for neuropathy Recent Labs  Lab 09/17/23 1945 09/18/23 0758  GLUCAP 178* 196*   CAD PAD s/p left fourth and fifth toe amputations HLD No anginal symptoms currently PTA meds- aspirin, Plavix, Lipitor Continue Lipitor.  Keep aspirin Plavix on hold in preparation of surgery  Chronic iron deficiency anemia Continue iron supplement Recent Labs    03/21/23 1630 04/05/23 1919 04/18/23 0937 06/18/23 1001 07/19/23 2159 07/19/23 2331 09/04/23 0803 09/05/23 0409 09/17/23 0954 09/18/23 0613  HGB  --    < > 11.1* 12.4*   < > 14.6 13.9 13.4 12.6* 12.2*  MCV  --    < > 72* 79   < >  --  82.0 80.8  82.3 81.4  FERRITIN 6*  --  30 48  --   --   --   --   --   --   TIBC 446  --  392 340  --   --   --   --   --   --   IRON 18*  --  56 61  --   --   --   --   --   --    < > = values in this interval not displayed.    Mobility: Encourage ambulation postprocedure  Goals of care   Code Status: Full Code    DVT prophylaxis:  enoxaparin (LOVENOX) injection 40 mg Start: 09/19/23  1000   Antimicrobials: IV vancomycin Fluid: None. Consultants: Podiatry Family Communication: None at bedside   Status: Inpatient Level of care:  Telemetry Medical   Patient is from: Home Needs to continue in-hospital care: Pending surgery Anticipated d/c to: Pending clinical course    Diet:  Diet Order             Diet NPO time specified  Diet effective midnight                   Scheduled Meds:  atorvastatin  40 mg Oral Daily   [START ON 09/19/2023] enoxaparin (LOVENOX) injection  40 mg Subcutaneous Q24H   ferrous sulfate  325 mg Oral Daily   insulin aspart  0-5 Units Subcutaneous QHS   insulin aspart  0-9 Units Subcutaneous TID WC   insulin glargine-yfgn  10 Units Subcutaneous QHS   mupirocin ointment  1 Application Nasal BID   pregabalin  100 mg Oral TID   senna  1 tablet Oral BID    PRN meds: acetaminophen **OR** acetaminophen, albuterol, bisacodyl, hydrALAZINE, polyethylene glycol   Infusions:   ceFEPime (MAXIPIME) IV 2 g (09/18/23 0840)   vancomycin 1,250 mg (09/18/23 1031)    Antimicrobials: Anti-infectives (From admission, onward)    Start     Dose/Rate Route Frequency Ordered Stop   09/18/23 2100  vancomycin (VANCOCIN) 2,250 mg in sodium chloride 0.9 % 500 mL IVPB  Status:  Discontinued        2,250 mg 261.3 mL/hr over 120 Minutes Intravenous Every 24 hours 09/17/23 1947 09/18/23 0903   09/18/23 1000  vancomycin (VANCOREADY) IVPB 1250 mg/250 mL        1,250 mg 166.7 mL/hr over 90 Minutes Intravenous Every 12 hours 09/18/23 0903     09/17/23 1815  vancomycin (VANCOCIN) IVPB 1000 mg/200 mL premix  Status:  Discontinued       Placed in "Followed by" Linked Group   1,000 mg 200 mL/hr over 60 Minutes Intravenous  Once 09/17/23 1646 09/18/23 0903   09/17/23 1700  ceFEPIme (MAXIPIME) 2 g in sodium chloride 0.9 % 100 mL IVPB        2 g 200 mL/hr over 30 Minutes Intravenous Every 8 hours 09/17/23 1646     09/17/23 1645  vancomycin (VANCOCIN) IVPB  1000 mg/200 mL premix       Placed in "Followed by" Linked Group   1,000 mg 200 mL/hr over 60 Minutes Intravenous  Once 09/17/23 1646 09/17/23 1908       Objective: Vitals:   09/18/23 0551 09/18/23 0744  BP: 122/72 126/82  Pulse: 84 83  Resp: 18 16  Temp: 98.5 F (36.9 C) 98.2 F (36.8 C)  SpO2: 98% 99%   No intake or output data in the 24 hours ending  09/18/23 1056 Filed Weights   09/17/23 1937 09/17/23 2143  Weight: 92.7 kg 92.7 kg   Weight change:  Body mass index is 24.88 kg/m.   Physical Exam: General exam: Pleasant African-American male Skin: No rashes, lesions or ulcers. HEENT: Atraumatic, normocephalic, no obvious bleeding Lungs: Clear to auscultation bilaterally CVS: Regular rate and rhythm, systolic ejection murmur present GI/Abd soft, nontender, nondistended, bowel sound present CNS: Alert, awake, oriented x 3 Psychiatry: Mood appropriate Extremities: Left lower extremity with open wound on the left third toe.  Redness not serviceable because of dark complexion of foot.  Swelling and warmth felt on the whole left foot extending up to the mid leg.   Data Review: I have personally reviewed the laboratory data and studies available.  F/u labs ordered Unresulted Labs (From admission, onward)     Start     Ordered   09/19/23 0500  Basic metabolic panel  Tomorrow morning,   R       Question:  Specimen collection method  Answer:  Lab=Lab collect   09/18/23 1054   09/19/23 0500  CBC with Differential/Platelet  Tomorrow morning,   R       Question:  Specimen collection method  Answer:  Lab=Lab collect   09/18/23 1054           Total time spent in review of labs and imaging, patient evaluation, formulation of plan, documentation and communication with family: 45 minutes  Signed, Lorin Glass, MD [facility] 09/18/2023

## 2023-09-18 NOTE — Progress Notes (Signed)
Patient on tele box 11

## 2023-09-18 NOTE — Op Note (Signed)
Full Operative Report  Date of Operation: 6:24 PM, 09/18/2023   Patient: Richard [name] - 62 y.o. male  Surgeon: Pilar Plate, DPM   Assistant: None  Diagnosis: Osteomyelitis of left foot  Procedure:  1.  Transmetatarsal amputation, left foot 2.  Insertion of antibiotic dissolvable calcium sulfate beads impregnated with vancomycin and tobramycin, left foot 3.  Tendo Achilles lengthening, left    Anesthesia: Monitor Anesthesia Care  Marcene Duos, MD  Anesthesiologist: Marcene Duos, MD CRNA: Thomasene Ripple, CRNA; Camillia Herter, CRNA   Estimated Blood Loss: 30 mL  Hemostasis: 1) Anatomical dissection, mechanical compression, electrocautery 2) no tourniquet was used during the procedure  Implants: Implant Name Type Inv. Item Serial No. Manufacturer Lot No. LRB No. Used Action  BIOBEADS ARTH CALC SULFATE 5CC - [id number] Bone Implant BIOBEADS ARTH CALC SULFATE 5CC  [employer] [id number] Left 1 Implanted    Materials: 2-0 Prolene and skin staples  Injectables: 1) Pre-operatively: 20 cc of half percent Marcaine plain 2) Post-operatively: None   Specimens: Pathology: Left foot remaining digits for pathology microbiology: Bone culture from third toe   Antibiotics: IV antibiotics given per schedule on the floor  Drains: None  Complications: Patient tolerated the procedure well without complication.   Operative findings: As below in detailed report  Indications for Procedure: Richard [name] presents to Pilar Plate, DPM with a chief complaint of ulceration with draining sinus tract left third toe with concern for underlying osteomyelitis of the entire digit and possible abscess at the base of the proximal phalanx.  Patient has prior fourth and fifth ray amputation on the left foot.  The patient has failed conservative treatments of various modalities. At this time the patient has elected to proceed with surgical correction. All  alternatives, risks, and complications of the procedures were thoroughly explained to the patient. Patient exhibits appropriate understanding of all discussion points and informed consent was signed and obtained in the chart with no guarantees to surgical outcome given or implied.  Description of Procedure: Patient was brought to the operating room. Patient remained on their hospital bed in the supine position. A surgical timeout was performed and all members of the operating room, the procedure, and the surgical site were identified. anesthesia occurred as per anesthesia record. Local anesthetic as previously described was then injected about the operative field in a local infiltrative block.  The operative lower extremity as noted above was then prepped and draped in the usual sterile manner. The following procedure then began.  Attention was directed to the left lower extremity. A fish-mouth type incision was made proximal to the web spaces and encompassed the entire forefoot. The full-thickness incision was made with a longer plantar flap to allow for wound closure. The incision was continued through the soft tissue down to the shafts of the metatarsal bones. A key elevator was then used to free up the periosteum on all the metatarsal shafts. Using an oscillating saw, the metatarsals 1 through 3 were cut in a dorsal distal to plantar proximal orientation. The first metatarsal was beveled so that the medial cortex was shorter than the lateral.  The amputation was done so that a metatarsal parabola was maintained.  The distal portion including all the digits were freed from the metatarsals and soft tissue attachments. The specimen was passed off the field and sent for gross pathology.  A rongeur was used to harvest a bone culture from the proximal phalanx of the third toe which was sent separately for  culture.  All remaining non-viable and necrotic tissues were sharply resected and removed. Extensor and  flexors tendons were grasped with a hemostat and cut proximally.  Good bleeding was noted throughout the procedure. The surgical site was then flushed with of saline under power pulse lavage.   Next calcium sulfate antibiotic beads were prepared on the back table.  1/2 g vancomycin and 3 mL of tobramycin liquid were mixed in with the beads to make median dissolvable antibiotic beads.  These were then implanted in the surgical site and they were closed over.  The plantar flap was brought in approximation with the dorsal flap and the sutures material including Prolene and skin staples previously described was used for closure. Care was taken not to place the flaps under tension in order not to jeopardize the vascular supply.  Attention was directed to the posterior left ankle where the Achilles tendon was identified using topographical anatomy.  Here, a triple hemisection was performed to lengthen the Achilles tendon and thus improve dorsiflexion of the foot. Two centimeters from the Achilles insertion, a 15 blade was inserted parallel with the long axis of the tendon and then rotated 90 degrees medially to release the medial half of the tendon.  In a similar fashion, the 15 blade was then inserted midline of the Achilles tendon 4 cm from the insertion and rotated 90 degrees laterally to transect the lateral half of the tendon.  Lastly, the 15 blade was inserted midline of the tendon 6 cm proximal from the insertion and rotated 90 degrees medially to release the medial half of the tendon. This was all done under passive dorsiflexion and an increase in ankle range of motion in the sagittal plane was noted.  All 3 sites were then flushed with sterile normal saline and closed with skin staples.  The surgical site was then dressed with Xeroform 4 x 4 ABD Kerlix Ace wrap. The patient tolerated both the procedure and anesthesia well with vital signs stable throughout. The patient was transferred in good  condition and all vital signs stable  from the OR to recovery under the discretion of anesthesia.  Condition: Vital signs stable, neurovascular status unchanged from preoperative   Surgical plan:  Anticipate clean surgical margins no evidence of residual infection surgical site closed well with no tension.  Good healing potential.  Nonweightbearing to the left lower extremity and cam boot.  Recommend 7 to 10 days p.o. antibiotic therapy patient will be stable for discharge tomorrow or Friday.  Patient will leave dressing clean dry and intact until follow-up with me in [place] location next week.  The patient will be nonweightbearing in a cam boot to the operative limb until further instructed. The dressing is to remain clean, dry, and intact. Will continue to follow unless noted elsewhere.   Carlena Hurl, DPM [facility]

## 2023-09-18 NOTE — Progress Notes (Signed)
IMTS to assume care at 7AM 12/12

## 2023-09-19 ENCOUNTER — Encounter (HOSPITAL_COMMUNITY): Payer: Self-pay | Admitting: Podiatry

## 2023-09-19 DIAGNOSIS — E1151 Type 2 diabetes mellitus with diabetic peripheral angiopathy without gangrene: Secondary | ICD-10-CM | POA: Diagnosis not present

## 2023-09-19 DIAGNOSIS — M86172 Other acute osteomyelitis, left ankle and foot: Secondary | ICD-10-CM | POA: Diagnosis not present

## 2023-09-19 DIAGNOSIS — L97524 Non-pressure chronic ulcer of other part of left foot with necrosis of bone: Secondary | ICD-10-CM

## 2023-09-19 DIAGNOSIS — E1140 Type 2 diabetes mellitus with diabetic neuropathy, unspecified: Secondary | ICD-10-CM

## 2023-09-19 DIAGNOSIS — Z794 Long term (current) use of insulin: Secondary | ICD-10-CM

## 2023-09-19 DIAGNOSIS — E1169 Type 2 diabetes mellitus with other specified complication: Secondary | ICD-10-CM

## 2023-09-19 LAB — CBC WITH DIFFERENTIAL/PLATELET
Abs Immature Granulocytes: 0.07 10*3/uL (ref 0.00–0.07)
Basophils Absolute: 0 10*3/uL (ref 0.0–0.1)
Basophils Relative: 1 %
Eosinophils Absolute: 0.1 10*3/uL (ref 0.0–0.5)
Eosinophils Relative: 3 %
HCT: 37.7 % — ABNORMAL LOW (ref 39.0–52.0)
Hemoglobin: 11.9 g/dL — ABNORMAL LOW (ref 13.0–17.0)
Immature Granulocytes: 1 %
Lymphocytes Relative: 9 %
Lymphs Abs: 0.5 10*3/uL — ABNORMAL LOW (ref 0.7–4.0)
MCH: 25.8 pg — ABNORMAL LOW (ref 26.0–34.0)
MCHC: 31.6 g/dL (ref 30.0–36.0)
MCV: 81.6 fL (ref 80.0–100.0)
Monocytes Absolute: 0.6 10*3/uL (ref 0.1–1.0)
Monocytes Relative: 11 %
Neutro Abs: 4.2 10*3/uL (ref 1.7–7.7)
Neutrophils Relative %: 75 %
Platelets: 293 10*3/uL (ref 150–400)
RBC: 4.62 MIL/uL (ref 4.22–5.81)
RDW: 14.8 % (ref 11.5–15.5)
WBC: 5.5 10*3/uL (ref 4.0–10.5)
nRBC: 0 % (ref 0.0–0.2)

## 2023-09-19 LAB — BASIC METABOLIC PANEL
Anion gap: 8 (ref 5–15)
BUN: 18 mg/dL (ref 8–23)
CO2: 23 mmol/L (ref 22–32)
Calcium: 8.5 mg/dL — ABNORMAL LOW (ref 8.9–10.3)
Chloride: 104 mmol/L (ref 98–111)
Creatinine, Ser: 0.94 mg/dL (ref 0.61–1.24)
GFR, Estimated: >60 mL/min (ref >60)
Glucose, Bld: 261 mg/dL — ABNORMAL HIGH (ref 70–99)
Potassium: 4.4 mmol/L (ref 3.5–5.1)
Sodium: 135 mmol/L (ref 135–145)

## 2023-09-19 LAB — GLUCOSE, CAPILLARY
Glucose-Capillary: 339 mg/dL — ABNORMAL HIGH (ref 70–99)
Glucose-Capillary: 144 mg/dL — ABNORMAL HIGH (ref 70–99)
Glucose-Capillary: 221 mg/dL — ABNORMAL HIGH (ref 70–99)
Glucose-Capillary: 228 mg/dL — ABNORMAL HIGH (ref 70–99)

## 2023-09-19 LAB — HEMOGLOBIN A1C
Hgb A1c MFr Bld: 7.9 % — ABNORMAL HIGH (ref 4.8–5.6)
Mean Plasma Glucose: 180.03 mg/dL

## 2023-09-19 MED ORDER — ASPIRIN 81 MG PO TBEC
81.0000 mg | DELAYED_RELEASE_TABLET | Freq: Every day | ORAL | Status: DC
  Administered 2023-09-19 – 2023-09-22 (×4): 81 mg via ORAL
  Filled 2023-09-19 (×4): qty 1

## 2023-09-19 MED ORDER — DOXYCYCLINE HYCLATE 100 MG PO TABS
100.0000 mg | ORAL_TABLET | Freq: Two times a day (BID) | ORAL | Status: DC
  Administered 2023-09-19 – 2023-09-22 (×7): 100 mg via ORAL
  Filled 2023-09-19 (×7): qty 1

## 2023-09-19 MED ORDER — OXYCODONE HCL 5 MG PO TABS
5.0000 mg | ORAL_TABLET | Freq: Three times a day (TID) | ORAL | Status: DC
  Administered 2023-09-19 – 2023-09-20 (×4): 5 mg via ORAL
  Filled 2023-09-19 (×4): qty 1

## 2023-09-19 MED ORDER — IBUPROFEN 800 MG PO TABS
800.0000 mg | ORAL_TABLET | Freq: Three times a day (TID) | ORAL | Status: AC
  Administered 2023-09-19 – 2023-09-21 (×9): 800 mg via ORAL
  Filled 2023-09-19 (×9): qty 1

## 2023-09-19 MED ORDER — ACETAMINOPHEN 500 MG PO TABS
1000.0000 mg | ORAL_TABLET | Freq: Four times a day (QID) | ORAL | Status: DC
  Administered 2023-09-19 – 2023-09-22 (×13): 1000 mg via ORAL
  Filled 2023-09-19 (×13): qty 2

## 2023-09-19 MED ORDER — DOXYCYCLINE HYCLATE 100 MG PO TABS: 100.0000 mg | ORAL_TABLET | Freq: Two times a day (BID) | ORAL | Status: DC

## 2023-09-19 MED ORDER — INSULIN GLARGINE-YFGN 100 UNIT/ML ~~LOC~~ SOLN
12.0000 [IU] | Freq: Every day | SUBCUTANEOUS | Status: DC
  Administered 2023-09-19: 12 [IU] via SUBCUTANEOUS
  Filled 2023-09-19 (×2): qty 0.12

## 2023-09-19 MED ORDER — HYDROMORPHONE HCL 1 MG/ML IJ SOLN
1.0000 mg | INTRAMUSCULAR | Status: DC | PRN
  Administered 2023-09-19 – 2023-09-20 (×2): 1 mg via INTRAVENOUS
  Filled 2023-09-19 (×2): qty 1

## 2023-09-19 MED ORDER — FLUTICASONE FUROATE-VILANTEROL 200-25 MCG/ACT IN AEPB
1.0000 | INHALATION_SPRAY | Freq: Every day | RESPIRATORY_TRACT | Status: DC
  Administered 2023-09-20 – 2023-09-22 (×2): 1 via RESPIRATORY_TRACT
  Filled 2023-09-19: qty 28

## 2023-09-19 MED ORDER — SENNA 8.6 MG PO TABS: 1.0000 | ORAL_TABLET | Freq: Every day | ORAL | Status: DC

## 2023-09-19 MED ORDER — SENNA 8.6 MG PO TABS: 1.0000 | ORAL_TABLET | Freq: Every evening | ORAL | Status: DC | PRN

## 2023-09-19 MED ORDER — FERROUS SULFATE 325 (65 FE) MG PO TABS
325.0000 mg | ORAL_TABLET | Freq: Every day | ORAL | Status: DC
Start: 2023-09-19 — End: 2023-09-19

## 2023-09-19 MED ORDER — ALBUTEROL SULFATE (2.5 MG/3ML) 0.083% IN NEBU
2.5000 mg | INHALATION_SOLUTION | RESPIRATORY_TRACT | Status: DC | PRN
  Administered 2023-09-19 (×2): 2.5 mg via RESPIRATORY_TRACT
  Filled 2023-09-19 (×2): qty 3

## 2023-09-19 MED ORDER — HYDROMORPHONE HCL 1 MG/ML IJ SOLN: 0.5000 mg | Freq: Once | INTRAMUSCULAR | Status: DC

## 2023-09-19 MED ORDER — UMECLIDINIUM BROMIDE 62.5 MCG/ACT IN AEPB
1.0000 | INHALATION_SPRAY | Freq: Every day | RESPIRATORY_TRACT | Status: DC
  Administered 2023-09-20 – 2023-09-22 (×2): 1 via RESPIRATORY_TRACT
  Filled 2023-09-19: qty 7

## 2023-09-19 MED ORDER — METFORMIN HCL 500 MG PO TABS
1000.0000 mg | ORAL_TABLET | Freq: Two times a day (BID) | ORAL | Status: DC
  Administered 2023-09-19 – 2023-09-20 (×4): 1000 mg via ORAL
  Filled 2023-09-19 (×4): qty 2

## 2023-09-19 MED ORDER — OXYCODONE HCL 5 MG PO TABS: 5.0000 mg | ORAL_TABLET | Freq: Four times a day (QID) | ORAL | Status: DC | PRN

## 2023-09-19 MED ORDER — AMOXICILLIN-POT CLAVULANATE 875-125 MG PO TABS
1.0000 | ORAL_TABLET | Freq: Two times a day (BID) | ORAL | Status: DC
  Administered 2023-09-19 – 2023-09-22 (×7): 1 via ORAL
  Filled 2023-09-19 (×7): qty 1

## 2023-09-19 MED ORDER — ALBUTEROL SULFATE (2.5 MG/3ML) 0.083% IN NEBU
INHALATION_SOLUTION | RESPIRATORY_TRACT | Status: AC
  Administered 2023-09-19: 2.5 mg
  Filled 2023-09-19: qty 3

## 2023-09-19 MED ORDER — ONDANSETRON HCL 4 MG/2ML IJ SOLN
4.0000 mg | Freq: Once | INTRAMUSCULAR | Status: AC
Start: 2023-09-19 — End: 2023-09-19
  Administered 2023-09-19: 4 mg via INTRAVENOUS
  Filled 2023-09-19: qty 2

## 2023-09-19 NOTE — Plan of Care (Signed)
  Problem: Fluid Volume: Goal: Ability to maintain a balanced intake and output will improve Outcome: Progressing   Problem: Coping: Goal: Ability to adjust to condition or change in health will improve Outcome: Progressing   Problem: Nutritional: Goal: Maintenance of adequate nutrition will improve Outcome: Progressing   Problem: Skin Integrity: Goal: Risk for impaired skin integrity will decrease Outcome: Progressing   Problem: Clinical Measurements: Goal: Will remain free from infection Outcome: Progressing

## 2023-09-19 NOTE — Progress Notes (Signed)
PT Cancellation Note  Patient Details Name: Richard [name] MRN: [id number] DOB: 1961-03-23   Cancelled Treatment:    Reason Eval/Treat Not Completed: Other (comment) Pt declining this AM due to high amounts of pain; RN aware.  Lillia Pauls, PT, DPT Acute Rehabilitation Services Office [phone]    Norval Morton 09/19/2023, 12:04 PM

## 2023-09-19 NOTE — Anesthesia Postprocedure Evaluation (Signed)
Anesthesia Post Note  Patient: Richard [name]  Procedure(s) Performed: TRANSMETATARSAL AMPUTATION (Left: Toe)     Patient location during evaluation: PACU Anesthesia Type: MAC Level of consciousness: awake and alert Pain management: pain level controlled Vital Signs Assessment: post-procedure vital signs reviewed and stable Respiratory status: spontaneous breathing, nonlabored ventilation, respiratory function stable and patient connected to nasal cannula oxygen Cardiovascular status: stable and blood pressure returned to baseline Postop Assessment: no apparent nausea or vomiting Anesthetic complications: no   No notable events documented.  Last Vitals:  Vitals:   09/19/23 0402 09/19/23 0820  BP: 129/66 (!) 153/75  Pulse: 85 95  Resp: 18 18  Temp: 36.9 C 37 C  SpO2: 99% 100%    Last Pain:  Vitals:   09/19/23 1007  TempSrc:   PainSc: 10-Worst pain ever                 Kennieth Rad

## 2023-09-19 NOTE — Progress Notes (Signed)
Assume of care at 1900, Bedside report  received from [facility], Patient alert and responsive c/o SOB PRN neb tx given and effective, c/o surgical pain noted to left foot, notified on call provider new order for Dilaudid.Fluids at bedside and belongings within reach company at bedside .

## 2023-09-19 NOTE — Progress Notes (Signed)
HD#2 Subjective:  Overnight Events: AM blood sugar >260  Patient assessed at bedside this morning.  He was on the phone with his sister Richard [name] who lives in [place].  She remained on phone during encounter.  He is in a high amount of pain today.  Pain is relieved with IV medications but does not last very long.  He has concerns about how he will be able to take care of himself at home with not being able to put weight on his foot.  Plans to come up from [place] to help him at home.  Talked with him about using today to help with pain control and have him work with therapies.  Pt is updated on the plan for today, and all questions and concerns are addressed.   Objective:  Vital signs in last 24 hours: Vitals:   09/18/23 1858 09/18/23 1933 09/19/23 0008 09/19/23 0402  BP: 124/79 133/74 132/65 129/66  Pulse: 78 84 86 85  Resp: 16 18 18 18   Temp: 97.6 F (36.4 C) 97.9 F (36.6 C) 97.6 F (36.4 C) 98.5 F (36.9 C)  TempSrc: Oral Oral Oral Oral  SpO2: 99% 95% 92% 99%  Weight:      Height:       Supplemental O2: Room Air SpO2: 99 %   Physical Exam:  Constitutional: well-appearing, in no acute distress Cardiovascular: regular rate and rhythm, no m/r/g Pulmonary/Chest: normal work of breathing on room air, lungs clear to auscultation bilaterally Abdominal: soft, non-tender, non-distended MSK: Nonpitting edema to left lower extremity, no pain in calf, Ace bandages wrapping surgical site Skin: warm and dry  Filed Weights   09/17/23 1937 09/17/23 2143  Weight: 92.7 kg 92.7 kg     Intake/Output Summary (Last 24 hours) at 09/19/2023 0703 Last data filed at 09/19/2023 0403 Gross per 24 hour  Intake 1200 ml  Output 850 ml  Net 350 ml   Net IO Since Admission: 350 mL [09/19/23 0703]  Pertinent Labs:    Latest Ref Rng & Units 09/19/2023    5:49 AM 09/18/2023    6:13 AM 09/17/2023    9:54 AM  CBC  WBC 4.0 - 10.5 K/uL 5.5  5.1  7.8   Hemoglobin  13.0 - 17.0 g/dL 40.9  81.1  91.4   Hematocrit 39.0 - 52.0 % 37.7  38.5  40.5   Platelets 150 - 400 K/uL 293  252  278        Latest Ref Rng & Units 09/19/2023    5:49 AM 09/18/2023    6:13 AM 09/17/2023    9:54 AM  CMP  Glucose 70 - 99 mg/dL 782  956  213   BUN 8 - 23 mg/dL 18  17  15    Creatinine 0.61 - 1.24 mg/dL 0.86  5.78  4.69   Sodium 135 - 145 mmol/L 135  133  136   Potassium 3.5 - 5.1 mmol/L 4.4  4.2  4.5   Chloride 98 - 111 mmol/L 104  103  102   CO2 22 - 32 mmol/L 23  22  25    Calcium 8.9 - 10.3 mg/dL 8.5  8.6  9.1     Imaging: DG Foot 2 Views Left Result Date: 09/18/2023 CLINICAL DATA:  Status post amputation EXAM: LEFT FOOT - 2 VIEW COMPARISON:  09/17/2023 FINDINGS: There is been interval transmetatarsal amputation of the first through third metatarsals. Multiple antibiotic beads are noted. Surgical staples are seen in place.  No erosive changes are noted. IMPRESSION: Status post metatarsal amputation without acute abnormality. Electronically Signed   By: Alcide Clever M.D.   On: 09/18/2023 23:36   MRI 12/10 IMPRESSION: 1. Soft tissue ulceration along the dorsal aspect of third PIP joint with exposed bone. Underlying acute osteomyelitis of the third proximal and middle phalanges and third PIP joint septic arthritis. 2. Small 8 mm abscess along the medial aspect of the third proximal phalanx.   Assessment/Plan:   Principal Problem:   Osteomyelitis (HCC) Active Problems:   PAD (peripheral artery disease) (HCC)   Ulcer of toe, left, with necrosis of bone [facility])   Patient Summary: Richard [name] is a 62 y.o. with a pertinent PMH of diabetes ablated by polyneuropathy and diabetic foot ulcer status post left fourth and fifth toe amputations, hypertension hyperlipidemia CAD complicated by PAD, who presented with fevers and worsening of left third toe diabetic ulcer and admitted on 12/11 for concern of osteomyelitis on HD#2.   Acute osteomyelitis, septic arthritis  and abscess of left third toe Post transmetatarsal amputation of left foot Who is following regularly with podiatry presented on December 10 with fever, pain, erythema. MRI showed osteomyelitis of the third small and middle phalanges with PIP septic arthritis and a small abscess.  Podiatry was consulted and patient taken to the OR yesterday evening.  He underwent transmetatarsal amputation.  Op note indicates that he had clean surgical margins with no evidence of residual infection.  Patient is to be nonweightbearing to the left lower extremity with a cam boot.  Podiatry recommended 7 to 10 days of p.o. antibiotic therapy.  Patient is concerned about how he will function at home at discharge with nonweightbearing recommendations.  He currently lives alone and has stairs to access his house.  His sister, Richard [name], would be willing to come up from [place] to help him short-term.  Change from IV to p.o. antibiotics.  Today's focus will be on pain control and safe discharge planning.  Appreciate therapy recommendations.  On exam today he does have some mild left lower extremity edema.  Will continue to monitor this closely and low threshold to get DVT ultrasound.  He states that he has not been very mobile since wound started on his foot a few weeks ago.  -Switch IV vancomycin to doxycycline 500 mg twice daily and augmentin BID for 7-day course -Surgical cultures pending -Appreciate podiatry recommendations with Dr. Annamary Rummage, patient to follow-up with him 1 week -PT/OT evaluation, nonweightbearing -Multimodal pain control with scheduled Tylenol, ibuprofen, and oxycodone -IV Dilaudid 0.5 mg every 4 hours for breakthrough pain -TOC consulted for home DME -Senna and MiraLAX as needed  Type 2 diabetes mellitus Diabetic neuropathy Hemoglobin A1c of 8.7 in September.  Home medications include Trulicity weekly, Tresiba 20 units daily, empagliflozin 12.5 mg twice daily, metformin 1000 mg twice daily  and Actos 30 mg daily.  Refill history indicates nonadherence, but patient endorses taking medications regularly.  Morning sugar elevated at 260.   -SSI -Increase semglee from 10 to 12 units nightly -Restart metformin at 1000 mg twice daily -Hold home empagliflozin and Actos -Continue Lyrica 100 mg 3 times daily  Chronic stable conditions: Asthma/COPD Sinew triple therapy with umeclidinium and Breo.  Patient request as needed albuterol nebulizers which he uses at home regularly.  He was recently admitted at the end of November for asthma exacerbation.  Concerns for acute exacerbation at this time.  -Continue triple therapy inhaler -Albuterol nebulizer as needed  PAD CAD  Restart atorvastatin 40 mg.  Check with podiatry if okay to restart aspirin today.  He follows with vascular surgery as an outpatient.  His last ABI was in July.  Left great toe ABI consistent with PAD at 0.84.  Otherwise numbers were normal.   Diet: Carb-Modified IVF: None,None VTE: Enoxaparin Code: Full PT/OT recs: Pending TOC recs: pending Family Update: Richard [name] updated on phone at bedside   Dispo: Anticipated discharge to Home in 1-2 days pending pain controlled and safe discharge planning.   Jill Ruppe M. Aneisa Karren, D.O.  Internal Medicine Resident, PGY-3 [facility]  Pager: [phone] 7:03 AM, 09/19/2023   **Please contact the on call pager after 5 pm and on weekends at [phone].**

## 2023-09-19 NOTE — Evaluation (Signed)
Physical Therapy Evaluation Patient Details Name: Richard [name] MRN: [id number] DOB: 1961/09/12 Today's Date: 09/19/2023  History of Present Illness  Pt is a 62 y.o. M who presents 09/17/2023 with acute osteomyelitis, septic arthritis and abscess of left third toe now s/p transmetatarsal amputation 12/11. Significant PMH: DM2, HTN,HLD, CAD, PAD, neuropathy, diabetic foot ulcer s/p left fourth and fifth toe amputations.  Clinical Impression  Pt s/p procedure listed above. Pt reporting high levels of pain in L achilles tendon; RN present to provide pain medication. Ortho tech present to fit medium vs large CAM boot; neither of which fit at this time over bulbous shape of left foot. Pt overall is mobilizing well. Hopping room distances with a RW with good adherence to weightbearing precautions. Education provided regarding continued elevation to address edema, precautions, bumping up steps (pt states he plans to stay on main level initially), appropriate DME (RW for shorter distances, w/c for longer distance mobilization), and monitoring for signs and symptoms of infection. Will continue to follow acutely but no PT follow up recommended at this time.      If plan is discharge home, recommend the following: Assistance with cooking/housework;Assist for transportation;Help with stairs or ramp for entrance   Can travel by private vehicle        Equipment Recommendations Rolling walker (2 wheels)  Recommendations for Other Services       Functional Status Assessment Patient has had a recent decline in their functional status and demonstrates the ability to make significant improvements in function in a reasonable and predictable amount of time.     Precautions / Restrictions Precautions Precautions: None Required Braces or Orthoses: Other Brace Other Brace: Orders for L CAM walker, but does not fit due to bulbous shape of L foot Restrictions Weight Bearing Restrictions Per Provider Order:  Yes LLE Weight Bearing Per Provider Order: Non weight bearing      Mobility  Bed Mobility Overal bed mobility: Modified Independent             General bed mobility comments: HOB elevated    Transfers Overall transfer level: Needs assistance Equipment used: Rolling walker (2 wheels) Transfers: Sit to/from Stand Sit to Stand: Supervision           General transfer comment: Cues for placing L foot anteriorly to prevent weightbearing prior to transition    Ambulation/Gait Ambulation/Gait assistance: Contact guard assist Gait Distance (Feet): 15 Feet Assistive device: Rolling walker (2 wheels) Gait Pattern/deviations: Step-to pattern Gait velocity: decreased Gait velocity interpretation: <1.31 ft/sec, indicative of household ambulator   General Gait Details: Hop to pattern, good adherence to precautions  Stairs            Wheelchair Mobility     Tilt Bed    Modified Rankin (Stroke Patients Only)       Balance Overall balance assessment: Needs assistance Sitting-balance support: Feet supported Sitting balance-Leahy Scale: Normal     Standing balance support: Bilateral upper extremity supported Standing balance-Leahy Scale: Poor Standing balance comment: reliant on AD due to weightbearing precautions                             Pertinent Vitals/Pain Pain Assessment Pain Assessment: Faces Faces Pain Scale: Hurts whole lot Pain Location: L achilles Pain Descriptors / Indicators: Operative site guarding, Grimacing, Constant Pain Intervention(s): Limited activity within patient's tolerance, Monitored during session, Premedicated before session    Home Living Family/patient expects to be discharged  to:: Private residence Living Arrangements: Alone Available Help at Discharge: Family;Available PRN/intermittently Type of Home: House Home Access: Stairs to enter Entrance Stairs-Rails: Right;Left;Can reach both Entrance Stairs-Number of  Steps: 1 at back and 3 at front Alternate Level Stairs-Number of Steps: flight Home Layout: Two level;1/2 bath on main level;Bed/bath upstairs Home Equipment: Cane - single point;BSC/3in1;Grab bars - tub/shower;Wheelchair - manual;Grab bars - toilet;Hand held shower head      Prior Function Prior Level of Function : Independent/Modified Independent             Mobility Comments: Active, no falls, taking culinary classes at [employer], but doesn't go back to school until January 8 ADLs Comments: ind     Extremity/Trunk Assessment   Upper Extremity Assessment Upper Extremity Assessment: RUE deficits/detail;LUE deficits/detail RUE Deficits / Details: Strength 5/5 LUE Deficits / Details: Strength 5/5    Lower Extremity Assessment Lower Extremity Assessment: RLE deficits/detail;LLE deficits/detail RLE Deficits / Details: Strength 5/5 LLE Deficits / Details: S/p TMA. Hip/knee strength 5/5.    Cervical / Trunk Assessment Cervical / Trunk Assessment: Normal  Communication   Communication Communication: No apparent difficulties  Cognition Arousal: Alert Behavior During Therapy: WFL for tasks assessed/performed Overall Cognitive Status: Within Functional Limits for tasks assessed                                          General Comments      Exercises     Assessment/Plan    PT Assessment Patient needs continued PT services  PT Problem List Decreased balance;Decreased mobility;Pain       PT Treatment Interventions Gait training;DME instruction;Stair training;Functional mobility training;Therapeutic exercise;Therapeutic activities;Balance training;Patient/family education    PT Goals (Current goals can be found in the Care Plan section)  Acute Rehab PT Goals Patient Stated Goal: to heal PT Goal Formulation: With patient Time For Goal Achievement: 10/03/23 Potential to Achieve Goals: Good    Frequency Min 1X/week     Co-evaluation                AM-PAC PT "6 Clicks" Mobility  Outcome Measure Help needed turning from your back to your side while in a flat bed without using bedrails?: None Help needed moving from lying on your back to sitting on the side of a flat bed without using bedrails?: None Help needed moving to and from a bed to a chair (including a wheelchair)?: A Little Help needed standing up from a chair using your arms (e.g., wheelchair or bedside chair)?: A Little Help needed to walk in hospital room?: A Little Help needed climbing 3-5 steps with a railing? : A Lot 6 Click Score: 19    End of Session   Activity Tolerance: Patient tolerated treatment well Patient left: in chair;with call bell/phone within reach Nurse Communication: Mobility status PT Visit Diagnosis: Difficulty in walking, not elsewhere classified (R26.2);Pain Pain - Right/Left: Left Pain - part of body: Ankle and joints of foot    Time: 8295-6213 PT Time Calculation (min) (ACUTE ONLY): 35 min   Charges:   PT Evaluation $PT Eval Low Complexity: 1 Low PT Treatments $Therapeutic Activity: 8-22 mins PT General Charges $$ ACUTE PT VISIT: 1 Visit         Lillia Pauls, PT, DPT Acute Rehabilitation Services Office [phone]   Norval Morton 09/19/2023, 4:10 PM

## 2023-09-19 NOTE — Progress Notes (Signed)
  Subjective:  Patient ID: Richard [name], male    DOB: 1960-11-28,  MRN: [id number]  Chief Complaint  Patient presents with   foot/toe pain/amputation scheduled tomorrow    DOS: 09/18/23 Procedure: 1.  Transmetatarsal amputation, left foot 2.  Insertion of antibiotic dissolvable calcium sulfate beads impregnated with vancomycin and tobramycin, left foot 3.  Tendo Achilles lengthening, left  62 y.o. male seen POD 1 s/p Above proc. He reports pain in foot. Better with pain meds but not yet controlled. Has remained NWB. Does not yet have boot in room when I saw him.   Review of Systems: Negative except as noted in the HPI. Denies N/V/F/Ch.   Objective:   Vitals:   09/19/23 0820 09/19/23 1203  BP: (!) 153/75 108/62  Pulse: 95 81  Resp: 18 17  Temp: 98.6 F (37 C) 97.8 F (36.6 C)  SpO2: 100% 98%   Body mass index is 24.88 kg/m. Constitutional Well developed. Well nourished.  Vascular Foot warm and well perfused. Capillary refill normal to all digits.  Calf is soft and supple, no posterior calf or knee pain, negative Homans' sign  Neurologic Normal speech. Oriented to person, place, and time. Epicritic sensation to light touch absent  Dermatologic Dressing c/D/I  Orthopedic: Minimal tenderness to palpation noted about the surgical site.   Multiple view plain film radiographs: S/p TMA with abx beads in place at amp site Assessment:   1. Osteomyelitis of left foot, unspecified type [facility])    Plan:  Patient was evaluated and treated and all questions answered.  S/p foot surgery left TMA, abx beads, TAl -Progressing as expected post-operatively. -XR: as above -WB Status: NWB in CAM boot -Sutures: to remain intact. -Medications: Abx per cultures, plan for 7  days po therapy - Dressing intact, may change tomorrow then follow up would be next thursday. If not will remain intact until follow up next week Tuesday.  No follow-ups on file.         [name],  DPM [facility]

## 2023-09-19 NOTE — Progress Notes (Signed)
Orthopedic Tech Progress Note Patient Details:  Richard [name] 1961/02/22 [id number]  Cam Walker left in room with patient as dressing is too bulky to apply at this time, per PT .  Ortho Devices Type of Ortho Device: CAM walker Ortho Device/Splint Location: LLE Ortho Device/Splint Interventions: Ordered, Application   Post Interventions Instructions Provided: Adjustment of device  Diannia Ruder 09/19/2023, 2:27 PM

## 2023-09-20 DIAGNOSIS — E1169 Type 2 diabetes mellitus with other specified complication: Secondary | ICD-10-CM | POA: Diagnosis not present

## 2023-09-20 DIAGNOSIS — L97524 Non-pressure chronic ulcer of other part of left foot with necrosis of bone: Secondary | ICD-10-CM | POA: Diagnosis not present

## 2023-09-20 DIAGNOSIS — M86172 Other acute osteomyelitis, left ankle and foot: Secondary | ICD-10-CM | POA: Diagnosis not present

## 2023-09-20 DIAGNOSIS — E1151 Type 2 diabetes mellitus with diabetic peripheral angiopathy without gangrene: Secondary | ICD-10-CM | POA: Diagnosis not present

## 2023-09-20 LAB — GLUCOSE, CAPILLARY
Glucose-Capillary: 223 mg/dL — ABNORMAL HIGH (ref 70–99)
Glucose-Capillary: 192 mg/dL — ABNORMAL HIGH (ref 70–99)
Glucose-Capillary: 104 mg/dL — ABNORMAL HIGH (ref 70–99)
Glucose-Capillary: 205 mg/dL — ABNORMAL HIGH (ref 70–99)

## 2023-09-20 LAB — SURGICAL PATHOLOGY

## 2023-09-20 MED ORDER — OXYCODONE HCL 5 MG PO TABS
5.0000 mg | ORAL_TABLET | ORAL | Status: DC | PRN
  Administered 2023-09-20 – 2023-09-21 (×4): 5 mg via ORAL
  Filled 2023-09-20 (×4): qty 1

## 2023-09-20 MED ORDER — POLYETHYLENE GLYCOL 3350 17 G PO PACK
17.0000 g | PACK | Freq: Every day | ORAL | Status: DC
  Administered 2023-09-20 – 2023-09-22 (×3): 17 g via ORAL
  Filled 2023-09-20 (×3): qty 1

## 2023-09-20 MED ORDER — INSULIN GLARGINE-YFGN 100 UNIT/ML ~~LOC~~ SOLN
20.0000 [IU] | Freq: Every day | SUBCUTANEOUS | Status: DC
  Administered 2023-09-20 – 2023-09-21 (×2): 20 [IU] via SUBCUTANEOUS
  Filled 2023-09-20 (×3): qty 0.2

## 2023-09-20 MED ORDER — OXYCODONE HCL 5 MG PO TABS
7.5000 mg | ORAL_TABLET | Freq: Four times a day (QID) | ORAL | Status: DC | PRN
  Administered 2023-09-20 – 2023-09-22 (×5): 7.5 mg via ORAL
  Filled 2023-09-20 (×5): qty 2

## 2023-09-20 NOTE — Evaluation (Signed)
Occupational Therapy Evaluation Patient Details Name: Richard [name] MRN: [id number] DOB: Jan 22, 1961 Today's Date: 09/20/2023   History of Present Illness Pt is a 62 y.o. M who presents 09/17/2023 with acute osteomyelitis, septic arthritis and abscess of left third toe now s/p transmetatarsal amputation 12/11. Significant PMH: DM2, HTN,HLD, CAD, PAD, neuropathy, diabetic foot ulcer s/p left fourth and fifth toe amputations.   Clinical Impression   Patient evaluated by Occupational Therapy with no further acute OT needs identified. All education has been completed and the patient has no further questions. See below for any follow-up Occupational Therapy or equipment needs. OT to sign off. Thank you for referral.   Pt would like to have pastoral care visit this admission. RN notified Pt does not have a post op shoe that fits in room. Please make sure to call ortho tech to get proper shoe prior to d/c        If plan is discharge home, recommend the following:      Functional Status Assessment  Patient has had a recent decline in their functional status and demonstrates the ability to make significant improvements in function in a reasonable and predictable amount of time.  Equipment Recommendations  None recommended by OT (reports has BSC and RW but if not is asking church)    Recommendations for Other Services       Precautions / Restrictions Precautions Precautions: None Precaution Comments: monitor O2 Required Braces or Orthoses: Other Brace Other Brace: Orders for L CAM walker, but does not fit due to bulbous shape of L foot Restrictions Weight Bearing Restrictions Per Provider Order: Yes LLE Weight Bearing Per Provider Order: Non weight bearing      Mobility Bed Mobility Overal bed mobility: Modified Independent                  Transfers Overall transfer level: Needs assistance Equipment used: Rolling walker (2 wheels) Transfers: Sit to/from Stand Sit to  Stand: Supervision           General transfer comment: no post ob shoe present to review      Balance Overall balance assessment: Needs assistance Sitting-balance support: Feet supported Sitting balance-Leahy Scale: Normal     Standing balance support: Bilateral upper extremity supported Standing balance-Leahy Scale: Poor Standing balance comment: reliant on AD due to weightbearing precautions                           ADL either performed or assessed with clinical judgement   ADL Overall ADL's : Modified independent                                       General ADL Comments: pt dressed on arrival. pt able to figure 4 cross.educated on sponge bath.     Vision Baseline Vision/History: 0 No visual deficits Patient Visual Report: No change from baseline       Perception         Praxis         Pertinent Vitals/Pain Pain Assessment Pain Assessment: Faces Faces Pain Scale: Hurts even more Breathing: normal Pain Location: L foot area Pain Descriptors / Indicators: Operative site guarding, Grimacing, Constant Pain Intervention(s): Repositioned, Other (comment) (educated on light touch to help with phantom pains)     Extremity/Trunk Assessment Upper Extremity Assessment Upper Extremity Assessment: Overall WFL for tasks assessed  Lower Extremity Assessment Lower Extremity Assessment: Defer to PT evaluation   Cervical / Trunk Assessment Cervical / Trunk Assessment: Normal   Communication Communication Communication: No apparent difficulties   Cognition Arousal: Alert Behavior During Therapy: WFL for tasks assessed/performed Overall Cognitive Status: Within Functional Limits for tasks assessed                                       General Comments  RA    Exercises     Shoulder Instructions      Home Living Family/patient expects to be discharged to:: Private residence Living Arrangements: Alone Available  Help at Discharge: Family;Available PRN/intermittently Type of Home: House Home Access: Stairs to enter [employer] of Steps: 1 at back and 3 at front Entrance Stairs-Rails: Right;Left;Can reach both Home Layout: Two level;1/2 bath on main level;Bed/bath upstairs Alternate Level Stairs-Number of Steps: flight Alternate Level Stairs-Rails: Right Bathroom Shower/Tub: Chief Strategy Officer: Standard Bathroom Accessibility: Yes   Home Equipment: Cane - single point;BSC/3in1;Grab bars - tub/shower;Wheelchair - manual;Grab bars - toilet;Hand held shower head   Additional Comments: sister will be with patient for 1-2 weeks. Son is avaiable. has church family      Prior Functioning/Environment Prior Level of Function : Independent/Modified Independent             Mobility Comments: Active, no falls, taking culinary classes at [employer], but doesn't go back to school until January 8 ADLs Comments: ind        OT Problem List:        OT Treatment/Interventions:      OT Goals(Current goals can be found in the care plan section) Acute Rehab OT Goals Patient Stated Goal: to be able to be indep  OT Frequency:      Co-evaluation              AM-PAC OT "6 Clicks" Daily Activity     Outcome Measure Help from another person eating meals?: None Help from another person taking care of personal grooming?: None Help from another person toileting, which includes using toliet, bedpan, or urinal?: None Help from another person bathing (including washing, rinsing, drying)?: None Help from another person to put on and taking off regular upper body clothing?: None Help from another person to put on and taking off regular lower body clothing?: None 6 Click Score: 24   End of Session Nurse Communication: Mobility status;Precautions;Weight bearing status  Activity Tolerance: Patient tolerated treatment well Patient left: in bed;with call bell/phone within reach  OT  Visit Diagnosis: Unsteadiness on feet (R26.81)                Time: 1610-9604 OT Time Calculation (min): 22 min Charges:  OT General Charges $OT Visit: 1 Visit OT Evaluation $OT Eval Low Complexity: 1 Low   Brynn, OTR/L  Acute Rehabilitation Services Office: [phone] .   Mateo Flow 09/20/2023, 4:33 PM

## 2023-09-20 NOTE — TOC Initial Note (Addendum)
Transition of Care (TOC) - Initial/Assessment Note   Spoke to patient and brother at bedside. Patient from home alone.   PT recommended no PT follow up but did recommend walker . Await OT evaluation. Asked patient if he has a 3 in 1 at home. He does not   Entered orders for walker and 3 in 1 asked MD team to sign. Messaged Mitch with [employer]   Mitch returned call . Patient received walker and 3 in 1 in 2021 , insurance will not cover at present. [employer] will call patient see if he wants to private pay for DME  Patient Details  Name: Richard [name] MRN: [id number] Date of Birth: [birth year]  Transition of Care [facility]) CM/SW Contact:    Kingsley Plan, RN Phone Number: 09/20/2023, 2:54 PM  Clinical Narrative:                   Expected Discharge Plan: Home/Self Care Barriers to Discharge: Continued Medical Work up   Patient Goals and CMS Choice Patient states their goals for this hospitalization and ongoing recovery are:: to return to home CMS Medicare.gov Compare Post Acute Care list provided to:: Patient Choice offered to / list presented to : Patient [place] ownership interest in [facility].provided to:: Patient    Expected Discharge Plan and Services   Discharge Planning Services: CM Consult Post Acute Care Choice: Durable Medical Equipment Living arrangements for the past 2 months: Single Family Home                 DME Arranged: 3-N-1, Walker rolling DME Agency: [employer] Date DME Agency Contacted: 09/20/23 Time DME Agency Contacted: [phone] Representative spoke with at DME Agency: Mitch HH Arranged: NA          Prior Living Arrangements/Services Living arrangements for the past 2 months: Single Family Home Lives with:: Self Patient language and need for interpreter reviewed:: Yes Do you feel safe going back to the place where you live?: Yes      Need for Family Participation in Patient Care: No (Comment) Care giver support system in place?: Yes  (comment)   Criminal Activity/Legal Involvement Pertinent to Current Situation/Hospitalization: No - Comment as needed  Activities of Daily Living   ADL Screening (condition at time of admission) Independently performs ADLs?: Yes (appropriate for developmental age) Is the patient deaf or have difficulty hearing?: No Does the patient have difficulty seeing, even when wearing glasses/contacts?: No Does the patient have difficulty concentrating, remembering, or making decisions?: No  Permission Sought/Granted   Permission granted to share information with : Yes, Verbal Permission Granted  Share Information with NAME: Brother at bedside  Permission granted to share info w AGENCY: [employer]        Emotional Assessment Appearance:: Appears stated age Attitude/Demeanor/Rapport: Engaged Affect (typically observed): Accepting Orientation: : Oriented to Self, Oriented to Place, Oriented to  Time, Oriented to Situation Alcohol / Substance Use: Not Applicable Psych Involvement: No (comment)  Admission diagnosis:  Osteomyelitis (HCC) [M86.9] Osteomyelitis of left foot, unspecified type [facility]) [M86.9] Patient Active Problem List   Diagnosis Date Noted   PAD (peripheral artery disease) (HCC) 09/18/2023   Ulcer of toe, left, with necrosis of bone (HCC) 09/18/2023   Osteomyelitis (HCC) 09/17/2023   Neuropathic ulcer of foot, unspecified laterality, limited to breakdown of skin (HCC) 09/10/2023   Asthma exacerbation 09/04/2023   Tobacco use disorder 04/06/2023   Iron deficiency anemia 03/21/2023   Eczema 11/22/2022   Anxiety 09/04/2022  Elevated TSH 08/17/2021   Mild aortic stenosis by prior echocardiogram 03/02/2021   Mild mitral stenosis by prior echocardiogram 03/02/2021   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   Impingement syndrome of right shoulder 01/29/2020   Mediastinal mass 08/25/2018   Osteoarthritis of left knee 01/09/2018   Peripheral  arterial disease (HCC) 10/21/2017   Erectile dysfunction associated with type 2 diabetes mellitus (HCC) 03/06/2017   Abdominal aortic atherosclerosis (HCC) 01/18/2017   Vitamin D deficiency 07/02/2016   Tubular adenoma of colon 02/06/2016   Diabetic neuropathy, painful (HCC) 11/30/2015   Overweight (BMI 25.0-29.9) 11/30/2015   CAD in native artery 10/20/2015   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    Essential hypertension    Type 2 diabetes mellitus with peripheral artery disease (HCC)    Asthma-COPD overlap syndrome (HCC)    PCP:  [name], DO Pharmacy:   [facility] [phone] Ginette Otto, [place] - [address] AT [facility] [address] [place] [address] Phone: [phone] Fax: [phone]  [facility] [address] [place] [address] Phone: [phone] Fax: [phone]  [facility] #[id number], [place] - [address] AT [facility] [address] [place] [address] Phone: [phone] Fax: [phone]     Social Drivers of Health (SDOH) Social History: SDOH Screenings   Food Insecurity: No Food Insecurity (09/17/2023)  Housing: Patient Declined (09/19/2023)  Transportation Needs: No Transportation Needs (09/17/2023)  Utilities: Not At Risk (09/17/2023)  Alcohol Screen: Low Risk  (09/27/2022)  Depression (PHQ2-9): Low Risk  (08/15/2023)  Social Connections: Moderately Isolated (11/28/2022)  Tobacco Use: Medium Risk (09/18/2023)   SDOH Interventions:     Readmission Risk Interventions    09/06/2023   10:25 AM  Readmission Risk Prevention Plan  Post Dischage Appt Complete  Medication Screening Complete  Transportation Screening Complete

## 2023-09-20 NOTE — Hospital Course (Signed)
#  Acute osteomyelitis, septic arthritis and abscess of left third toe #S/p transmetatarsal amputation of left foot #PAD Presented to ED for left foot pain and fever, was seen by podiatrist on 12/9 concerns osteomyelitis of third digit of the left foot, discussed about surgical removal.  MR foot showed soft tissue ulceration along the dorsal aspect of third PIP joint with exposed bone.  Underlying acute osteomyelitis of the third proximal middle phalanges and third PIP joint septic arthritis.  Small 8 mm abscess along the medial aspect of the third proximal phalanx.  EDP consulted podiatrist, patient was taken to the OR on 12/11 for transmetatarsal amputation of the left foot.   POD 2, reports overall doing well. Pain is controlled on the current pain regimen. Appreciate podiatry recommendation.  He currently lives alone, he is waiting for his sister to drive back from [facility] tomorrow, so he can have someone to take care of him at home.    Plan:  -P.o. doxycycline 500 mg twice daily 2/7 -P.o. Augmentin 875-125 mg BID 2/7  -PT/OT, nonweightbearing -Multimodal pain regimen as below -Continue Tylenol 1000 mg every 6 hours -Continue Advil 800 mg every 8 hours -Continue oxycodone 5 mg every 4 hours as needed breakthrough pain -Continue oxycodone 7.5 mg every 6 hours as needed for moderate pain -Senna and MiraLAX as bowel regimen  -TOC for home DME -Outpatient follow-up with podiatry on 12/18 -Outpatient follow-up with PCP on 12/19 -Would likely discharge Saturday/Sunday   #Type 2 diabetes mellitus #Diabetic neuropathy Home medications include Trulicity weekly, Tresiba 20 units daily, empagliflozin 12.5 mg twice daily, metformin 1000 mg twice daily and Actos 30 mg daily. CBG this AM 223.   Plan: -Semglee increased from 12 units to 20 units at bedtime, back to home dose -SSI with CBG checks  -Continue metformin 1000 mg twice daily -Hold home metoprolol send Actos -Continue Lyrica 100 mg 3  times daily   #Chronic stable conditions: Asthma/COPD: No wheezing or shortness of breath noted. Continue Proventil every 4 hours as needed for shortness of breath and wheezing, Incruse Ellipta/Breo Ellipta inhaler. CAD: Continue aspirin 81 mg atorvastatin 40 mg daily  ------------------------------------------------------------------------------------

## 2023-09-20 NOTE — Plan of Care (Signed)
Adequate balanced intake

## 2023-09-20 NOTE — Plan of Care (Signed)
Patient resting quietly in bed, sitting up and watching TV. He states he feels he has had increased wheezing following last two PRN IV Dilaudid admin for c/o pain. This RN can hear increased wheezing and offered him Nebulizer treatment and/or offered to contact his MD to request Benadryl if needed. He refused need for breathing treatment and Benadryl at that time, but states he will discuss with the MD this morning when rounding. This RN encouraged pt to call out if he has any changes in breathing or other concerns and he agrees, but states he has no needs at this time. VS WNL for him, he remains afebrile and states he will get OOB and up to chair to eat breakfast. This RN encouraged this and agreed a great idea. Will cont to monitor readiness for D/c to home.

## 2023-09-20 NOTE — Care Management Important Message (Signed)
Important Message  Patient Details  Name: Richard [name] MRN: [id number] Date of Birth: Sep 01, 1961   Important Message Given:  Yes - Medicare IM     Sherilyn Banker 09/20/2023, 3:58 PM

## 2023-09-20 NOTE — Progress Notes (Signed)
   09/20/23 0651  What Happened  Was fall witnessed? No  Was patient injured? No  Patient found on floor  Found by Staff-comment Gardiner Barefoot, RN)  Stated prior activity to/from bed, chair, or stretcher  Provider Notification  Provider Name/Title Carmina Gatti, MD  Date Provider Notified 09/20/23  Time Provider Notified [phone]  Method of Notification Page (secure chat)  Notification Reason Fall  Provider response Other (Comment) (awaiting response)  Follow Up  Family notified Yes - comment  Time family notified 0730  Additional tests No  Simple treatment Other (comment)  Progress note created (see row info)  (none needed)  Adult Fall Risk Assessment  Risk Factor Category (scoring not indicated) Fall has occurred during this admission (document High fall risk)  Age 62  Fall History: Fall within 6 months prior to admission 0  Elimination; Bowel and/or Urine Incontinence 0  Elimination; Bowel and/or Urine Urgency/Frequency 0  Medications: includes PCA/Opiates, Anti-convulsants, Anti-hypertensives, Diuretics, Hypnotics, Laxatives, Sedatives, and Psychotropics 3  Patient Care Equipment 1  Mobility-Assistance 2  Mobility-Gait 2  Mobility-Sensory Deficit 0  Altered awareness of immediate physical environment 0  Impulsiveness 0  Lack of understanding of one's physical/cognitive limitations 0  Total Score 9  Patient Fall Risk Level High fall risk  Adult Fall Risk Interventions  Required Bundle Interventions *See Row Information* High fall risk - low, moderate, and high requirements implemented  Additional Interventions Safety Sitter/Safety Rounder;Use of appropriate toileting equipment (bedpan, BSC, etc.);Other (Comment) (remind pt to call for assist, staff remain with pt to ambulate)  Fall intervention(s) refused/Patient educated regarding refusal Nonskid socks;Yellow bracelet;Bed alarm  Screening for Fall Injury Risk (To be completed on HIGH fall risk patients) - Assessing Need  for Floor Mats  Risk For Fall Injury- Criteria for Floor Mats None identified - No additional interventions needed  Vitals  Temp 98.3 F (36.8 C)  Temp Source Oral  BP 133/73  MAP (mmHg) 92  BP Location Left Arm  BP Method Automatic  Patient Position (if appropriate) Sitting  Pulse Rate 85  Oxygen Therapy  SpO2 100 %  O2 Device Room Air  Pain Assessment  Pain Scale 0-10  Pain Score 7  Pain Type Surgical pain  Pain Location Foot  Pain Orientation Left  Neurological  Neuro (WDL) WDL  Level of Consciousness Alert  Orientation Level Oriented X4  Cognition Appropriate at baseline  Motor Function/Sensation Assessment Grip  R Hand Grip Strong  L Hand Grip Strong  Neuro Additional Assessments Glasgow Coma Scale  Glasgow Coma Scale  Eye Opening 4  Best Verbal Response (NON-intubated) 5  Best Motor Response 6  Glasgow Coma Scale Score 15  Musculoskeletal  Musculoskeletal (WDL) X  Assistive Device Front wheel walker  Weight Bearing Restrictions Per Provider Order Yes  LLE Weight Bearing Per Provider Order NWB  Musculoskeletal Details  LLE Other (Comment) (amputated toes)  Integumentary  Integumentary (WDL) X  Skin Color Appropriate for ethnicity  Skin Condition Dry  Skin Integrity Other (Comment) (see LDA)  Skin Turgor Non-tenting  Pain Assessment  Result of Injury No  Pain Assessment  Work-Related Injury No

## 2023-09-20 NOTE — Progress Notes (Addendum)
HD#3 SUBJECTIVE:  Patient Summary: Richard [name] is a 62 y.o. with a pertinent PMH of diabetes ablated by polyneuropathy and diabetic foot ulcer status post left fourth and fifth toe amputations, hypertension hyperlipidemia CAD complicated by PAD, who presented with fevers and worsening of left third toe diabetic ulcer and admitted on 12/11 for concern of osteomyelitis.   Overnight Events: Patient with 10 out of 10 toe pain, increase Dilaudid from 0.5-1.0 every 4 as needed  Interim History: Patient was evaluated at bedside. Patient endorses fall this AM.  States that he was given his pain medications by RN, RN offered any help, he did not needed any help, he wanted to go to the restroom, he got up, sued his walker, went to the restroom and came back, he was trying to sit on the chair, at this point he lost his balance, he fell back and attempt to avoid falling on his left foot.  He reports he hit his head however denies any headaches, vision changes, bleed, swelling.  Denies any deficits.  Denies any dizziness or lightheadedness.  Denies any trauma.  Denies any body aches.  Denies any chest pain or shortness of breath.  Otherwise, patient reports that his pain is controlled.  He reports that his sister will be heading from [facility] tomorrow morning, he would like to have a family member in home who can take care of him.  His sister will likely be here tomorrow, he would like to be discharged on Sunday to allow his sister to be here and settled down on Saturday.  OBJECTIVE:  Vital Signs: Vitals:   09/19/23 1936 09/20/23 0433 09/20/23 0651 09/20/23 0711  BP: 116/61 116/72 133/73 127/78  Pulse: 79 86 85 81  Resp:  (!) 22  16  Temp: 97.8 F (36.6 C) 98.2 F (36.8 C) 98.3 F (36.8 C) 98.5 F (36.9 C)  TempSrc: Oral Oral Oral Oral  SpO2: 100% 98% 100% 97%  Weight:      Height:       Supplemental O2: Room Air SpO2: 97 %  Filed Weights   09/17/23 1937 09/17/23 2143   Weight: 92.7 kg 92.7 kg     Intake/Output Summary (Last 24 hours) at 09/20/2023 8119 Last data filed at 09/19/2023 1300 Gross per 24 hour  Intake 720 ml  Output 200 ml  Net 520 ml   Net IO Since Admission: 870 mL [09/20/23 0717]  Physical Exam: Physical Exam General: Sitting on chair, no acute distress HENT: Atraumatic, normocephalic, no tenderness with palpation occipital region, cervical spine. PERRL, EOMI. +chronic cataracts/ astigmatism. No laceration, erythema, edema noted Cardiovascular: Normal rate, normal rhythm, no murmurs rubs or gallops. Pulmonary: Normal work of breathing, lungs are clear to auscultation.  No evidence of wheezing. Abdomen: Soft, nondistended, nontender, bowel sounds present MSK: Left leg warm to touch, no acute tenderness upon palpation, +Left foot dressing on    Patient Lines/Drains/Airways Status     Active Line/Drains/Airways     Name Placement date Placement time Site Days   Peripheral IV 09/18/23 18 G Left;Posterior Forearm 09/18/23  1657  Forearm  2   Peripheral IV 09/19/23 20 G 1.88" Right;Lateral Forearm 09/19/23  1936  Forearm  1   Incision (Closed) 09/18/23 Foot Left 09/18/23  1815  -- 2             ASSESSMENT/PLAN:  Assessment: Principal Problem:   Osteomyelitis (HCC) Active Problems:   PAD (peripheral artery disease) (HCC)   Ulcer of  toe, left, with necrosis of bone (HCC)   Plan: #Acute osteomyelitis, septic arthritis and abscess of left third toe #S/p transmetatarsal amputation of left foot #PAD Presented to ED for left foot pain and fever, was seen by podiatrist on 12/9 concerns osteomyelitis of third digit of the left foot, discussed about surgical removal.  MR foot showed soft tissue ulceration along the dorsal aspect of third PIP joint with exposed bone.  Underlying acute osteomyelitis of the third proximal middle phalanges and third PIP joint septic arthritis.  Small 8 mm abscess along the medial aspect of the third  proximal phalanx.  EDP consulted podiatrist, patient was taken to the OR on 12/11 for transmetatarsal amputation of the left foot.  POD 2, reports overall doing well. Pain is controlled on the current pain regimen. Appreciate podiatry recommendation.  He currently lives alone, he is waiting for his sister to drive back from [facility] tomorrow, so he can have someone to take care of him at home.   Plan:  -P.o. doxycycline 500 mg twice daily 2/7 -P.o. Augmentin 875-125 mg BID 2/7  -PT/OT, nonweightbearing -Multimodal pain regimen as below -Continue Tylenol 1000 mg every 6 hours -Continue Advil 800 mg every 8 hours -Continue oxycodone 5 mg every 4 hours as needed breakthrough pain -Continue oxycodone 7.5 mg every 6 hours as needed for moderate pain -Senna and MiraLAX as bowel regimen  -TOC for home DME -Outpatient follow-up with podiatry on 12/18 -Outpatient follow-up with PCP on 12/19 -Would likely discharge Saturday/Sunday  #Type 2 diabetes mellitus #Diabetic neuropathy Home medications include Trulicity weekly, Tresiba 20 units daily, empagliflozin 12.5 mg twice daily, metformin 1000 mg twice daily and Actos 30 mg daily. CBG this AM 223.  Plan: -Semglee increased from 12 units to 20 units at bedtime, back to home dose -SSI with CBG checks  -Continue metformin 1000 mg twice daily -Hold home metoprolol send Actos -Continue Lyrica 100 mg 3 times daily  #Chronic stable conditions: Asthma/COPD: No wheezing or shortness of breath noted. Continue Proventil every 4 hours as needed for shortness of breath and wheezing, Incruse Ellipta/Breo Ellipta inhaler. CAD: Continue aspirin 81 mg atorvastatin 40 mg daily  Best Practice: Diet: Carb modified IVF: Fluids: None, Rate: None VTE: enoxaparin (LOVENOX) injection 40 mg Start: 09/19/23 1000 Code: Full AB: Due to doxycycline and Augmentin Therapy Recs: Home Health, DME: walker Family Contact: Daughter/Sister, called and notified. DISPO:  Anticipated discharge  1-2 days  to Home pending  pain management .  Signature: [name], D.O.  Internal Medicine Resident, PGY-1 [facility]  Pager: [phone] 7:17 AM, 09/20/2023   Please contact the on call pager after 5 pm and on weekends at [phone].   Internal Medicine Attending:  I was physically present during the critical or key portions of the resident provided service and participated in formulating the plan of care and medical decision making for the patient as documented in the resident's note.  [name], DO

## 2023-09-20 NOTE — Progress Notes (Signed)
Physical Therapy Treatment Patient Details Name: Richard [name] MRN: [id number] DOB: Jul 02, 1961 Today's Date: 09/20/2023   History of Present Illness Pt is a 62 y.o. M who presents 09/17/2023 with acute osteomyelitis, septic arthritis and abscess of left third toe now s/p transmetatarsal amputation 12/11. Significant PMH: DM2, HTN,HLD, CAD, PAD, neuropathy, diabetic foot ulcer s/p left fourth and fifth toe amputations.    PT Comments  Pt still reporting higher levels of pain in left achilles; however, he is in good spirits and agreeable to PT session. Had a documented fall this morning where he lost his balance returning from the bathroom to the recliner. He is mobilizing well, ambulating hallway distances with good adherence to weightbearing precautions. He is inherently at higher risk of falling with nonweightbearing status and does a good job overall of maintaining them. Reviewed bumping up the stairs and standing/supine HEP for general strengthening (written handout provided). No PT follow up recommended at this time.    If plan is discharge home, recommend the following: Assistance with cooking/housework;Assist for transportation;Help with stairs or ramp for entrance   Can travel by private vehicle        Equipment Recommendations  Rolling walker (2 wheels)    Recommendations for Other Services       Precautions / Restrictions Precautions Precautions: None Required Braces or Orthoses: Other Brace Other Brace: Orders for L CAM walker, but does not fit due to bulbous shape of L foot Restrictions Weight Bearing Restrictions Per Provider Order: Yes LLE Weight Bearing Per Provider Order: Non weight bearing     Mobility  Bed Mobility               General bed mobility comments: OOB in chair upon entry    Transfers Overall transfer level: Needs assistance Equipment used: Rolling walker (2 wheels) Transfers: Sit to/from Stand Sit to Stand: Supervision            General transfer comment: Cues for placing L foot anteriorly to prevent weightbearing prior to transition    Ambulation/Gait Ambulation/Gait assistance: Contact guard assist Gait Distance (Feet): 60 Feet Assistive device: Rolling walker (2 wheels) Gait Pattern/deviations: Step-to pattern Gait velocity: decreased     General Gait Details: Hop to pattern, good adherence to precautions   Stairs             Wheelchair Mobility     Tilt Bed    Modified Rankin (Stroke Patients Only)       Balance Overall balance assessment: Needs assistance Sitting-balance support: Feet supported Sitting balance-Leahy Scale: Normal     Standing balance support: Bilateral upper extremity supported Standing balance-Leahy Scale: Poor Standing balance comment: reliant on AD due to weightbearing precautions                            Cognition Arousal: Alert Behavior During Therapy: WFL for tasks assessed/performed Overall Cognitive Status: Within Functional Limits for tasks assessed                                          Exercises Other Exercises Other Exercises: Standing with RW support: Single leg RLE squat x 5, LLE hip flexion x 5    General Comments        Pertinent Vitals/Pain Pain Assessment Pain Assessment: Faces Faces Pain Scale: Hurts even more Pain Location: L achilles Pain  Descriptors / Indicators: Operative site guarding, Grimacing, Constant Pain Intervention(s): Limited activity within patient's tolerance, Monitored during session, Premedicated before session, Repositioned    Home Living                          Prior Function            PT Goals (current goals can now be found in the care plan section) Acute Rehab PT Goals Patient Stated Goal: to heal PT Goal Formulation: With patient Time For Goal Achievement: 10/03/23 Potential to Achieve Goals: Good Progress towards PT goals: Progressing toward goals     Frequency    Min 1X/week      PT Plan      Co-evaluation              AM-PAC PT "6 Clicks" Mobility   Outcome Measure  Help needed turning from your back to your side while in a flat bed without using bedrails?: None Help needed moving from lying on your back to sitting on the side of a flat bed without using bedrails?: None Help needed moving to and from a bed to a chair (including a wheelchair)?: A Little Help needed standing up from a chair using your arms (e.g., wheelchair or bedside chair)?: A Little Help needed to walk in hospital room?: A Little Help needed climbing 3-5 steps with a railing? : A Lot 6 Click Score: 19    End of Session   Activity Tolerance: Patient tolerated treatment well Patient left: in chair;with call bell/phone within reach Nurse Communication: Mobility status PT Visit Diagnosis: Difficulty in walking, not elsewhere classified (R26.2);Pain Pain - Right/Left: Left Pain - part of body: Ankle and joints of foot     Time: 8469-6295 PT Time Calculation (min) (ACUTE ONLY): 26 min  Charges:    $Therapeutic Activity: 23-37 mins PT General Charges $$ ACUTE PT VISIT: 1 Visit                     Lillia Pauls, PT, DPT Acute Rehabilitation Services Office [phone]    Norval Morton 09/20/2023, 11:27 AM

## 2023-09-20 NOTE — Progress Notes (Signed)
  Subjective:  Patient ID: Richard [name], male    DOB: [birth year],  MRN: [id number]  Chief Complaint  Patient presents with   foot/toe pain/amputation scheduled tomorrow    DOS: 09/18/23 Procedure: 1.  Transmetatarsal amputation, left foot 2.  Insertion of antibiotic dissolvable calcium sulfate beads impregnated with vancomycin and tobramycin, left foot 3.  Tendo Achilles lengthening, left  62 y.o. male seen POD 2 s/p Above proc. He reports pain is decreased today. Had a fall earlier but did not hit the foot. Has remained NWB. Does have boot but doesn't fit with edema/bulky dsg.   Review of Systems: Negative except as noted in the HPI. Denies N/V/F/Ch.   Objective:   Vitals:   09/20/23 0711 09/20/23 0740  BP: 127/78   Pulse: 81   Resp: 16   Temp: 98.5 F (36.9 C)   SpO2: 97% 98%   Body mass index is 24.88 kg/m. Constitutional Well developed. Well nourished.  Vascular Foot warm and well perfused. Capillary refill normal to all digits.  Calf is soft and supple, no posterior calf or knee pain, negative Homans' sign  Neurologic Normal speech. Oriented to person, place, and time. Epicritic sensation to light touch absent  Dermatologic Amp site healing nicely no evidence of residual infection or dehisence.   Orthopedic: Minimal tenderness to palpation noted about the surgical site.   Multiple view plain film radiographs: S/p TMA with abx beads in place at amp site Assessment:   1. Osteomyelitis of left foot, unspecified type [facility])    Plan:  Patient was evaluated and treated and all questions answered.  S/p foot surgery left TMA, abx beads, TAl -Progressing as expected post-operatively. Amp site healing well at this time -XR: as above -WB Status: NWB in CAM boot, ok to just have soft dsg on if not able to get cam boot on currently. Take boot for future use when edema decreases and begins WB -Sutures: to remain intact. -Medications: Abx per cultures, plan for 7  days po  therapy - Dressing changed today, leave this dressing c/d/I until follow up in office next week Thursday.  Stable for dc from my standpoint - reports his sister coming to stay with him will be there tmrw and wants DC on Sunday - Will sign off at this time, please reach out to dr. Ardelle Anton who is on this weekend if questions or concerns.         [name], DPM [facility] / [facility]

## 2023-09-21 ENCOUNTER — Other Ambulatory Visit (HOSPITAL_COMMUNITY): Payer: Self-pay

## 2023-09-21 DIAGNOSIS — M86172 Other acute osteomyelitis, left ankle and foot: Secondary | ICD-10-CM | POA: Diagnosis not present

## 2023-09-21 DIAGNOSIS — Z89432 Acquired absence of left foot: Secondary | ICD-10-CM

## 2023-09-21 DIAGNOSIS — E1151 Type 2 diabetes mellitus with diabetic peripheral angiopathy without gangrene: Secondary | ICD-10-CM | POA: Diagnosis not present

## 2023-09-21 LAB — GLUCOSE, CAPILLARY
Glucose-Capillary: 196 mg/dL — ABNORMAL HIGH (ref 70–99)
Glucose-Capillary: 268 mg/dL — ABNORMAL HIGH (ref 70–99)
Glucose-Capillary: 134 mg/dL — ABNORMAL HIGH (ref 70–99)

## 2023-09-21 MED ORDER — METFORMIN HCL 500 MG PO TABS
1000.0000 mg | ORAL_TABLET | Freq: Two times a day (BID) | ORAL | Status: DC
  Administered 2023-09-21 – 2023-09-22 (×2): 1000 mg via ORAL
  Filled 2023-09-21 (×2): qty 2

## 2023-09-21 MED ORDER — EMPAGLIFLOZIN 25 MG PO TABS
25.0000 mg | ORAL_TABLET | Freq: Every day | ORAL | Status: DC
  Administered 2023-09-21 – 2023-09-22 (×2): 25 mg via ORAL
  Filled 2023-09-21 (×2): qty 1

## 2023-09-21 MED ORDER — DOXYCYCLINE HYCLATE 100 MG PO TABS
100.0000 mg | ORAL_TABLET | Freq: Two times a day (BID) | ORAL | 0 refills | Status: DC
  Filled 2023-09-21: qty 8, 4d supply, fill #0

## 2023-09-21 MED ORDER — MUPIROCIN 2 % EX OINT
1.0000 | TOPICAL_OINTMENT | Freq: Two times a day (BID) | CUTANEOUS | 0 refills | Status: DC
  Filled 2023-09-21: qty 22, 11d supply, fill #0

## 2023-09-21 MED ORDER — EMPAGLIFLOZIN-METFORMIN HCL 12.5-1000 MG PO TABS: 1.0000 | ORAL_TABLET | Freq: Two times a day (BID) | ORAL | Status: DC

## 2023-09-21 MED ORDER — AMOXICILLIN-POT CLAVULANATE 875-125 MG PO TABS
1.0000 | ORAL_TABLET | Freq: Two times a day (BID) | ORAL | 0 refills | Status: AC
  Filled 2023-09-21: qty 8, 4d supply, fill #0

## 2023-09-21 MED ORDER — OXYCODONE HCL 7.5 MG PO TABS
7.5000 mg | ORAL_TABLET | Freq: Three times a day (TID) | ORAL | 0 refills | Status: DC | PRN
  Filled ????-??-??: fill #0

## 2023-09-21 MED ORDER — CLOPIDOGREL BISULFATE 75 MG PO TABS
75.0000 mg | ORAL_TABLET | Freq: Every day | ORAL | Status: DC
  Administered 2023-09-21 – 2023-09-22 (×2): 75 mg via ORAL
  Filled 2023-09-21 (×2): qty 1

## 2023-09-21 MED ORDER — POLYETHYLENE GLYCOL 3350 17 GM/SCOOP PO POWD
17.0000 g | Freq: Every day | ORAL | 0 refills | Status: DC
  Filled 2023-09-21: qty 238, 14d supply, fill #0

## 2023-09-21 NOTE — Discharge Instructions (Signed)
Mr. Devlyn Placek came to the hospital for left foot pain, you had a left foot amputation.  We treated you with antibiotics and pain medication.  For your left foot amputation -Augmentin, take 1 tablet by mouth 2 times daily for 4 days -Doxycycline, take 1 tablet by mouth 2 times daily for 4 days -You can apply Bactroban ointment on the left foot wound -For pain you can take oxycodone 7.5 mg every 8 hours as needed for pain control -You can also take over-the-counter Tylenol 1000 mg every 6 hours as needed for pain -You can take over-the-counter Advil 800 mg every 8 hours as needed for pain -Please use the Unna boot for your left foot when you ambulate  Please follow-up with your podiatry on December 19  Please follow-up with your primary care doctor on December 19  Otherwise please continue the rest of your medications as prescribed  If you have any of these following symptoms, please call us or seek care at an emergency department: -Chest Pain -Difficulty Breathing -Worsening abdominal pain -Syncope (passing out) -Drooping of face -Slurred speech -Sudden weakness in your leg or arm -Fever -Chills -blood in the stool -dark black, sticky stool  If you have any questions or concerns, call our clinic at [phone] or after hours call [phone] and ask for the internal medicine resident on call.  I am glad you are feeling better. It was a pleasure taking care for you. I wish a good recovery and good health!   Dr. [name]

## 2023-09-21 NOTE — TOC Progression Note (Signed)
Discharge medications (3) are being stored in the Transitions of Care [facility]) [facility]acy on the second floor until patient is ready for discharge.

## 2023-09-21 NOTE — Progress Notes (Addendum)
HD#4 SUBJECTIVE:  Patient Summary: Richard [name] is a 62 y.o. with a pertinent PMH of diabetes ablated by polyneuropathy and diabetic foot ulcer status post left fourth and fifth toe amputations, hypertension hyperlipidemia CAD complicated by PAD, who presented with fevers and worsening of left third toe diabetic ulcer and admitted on 12/11 for concern of osteomyelitis.   Overnight Events: None  Interim History: Patient is evaluated bedside, reports overall doing well.  He denies any chest pain, shortness of breath, back pain.  Denies any further falls.  He is more cautious.  Reports that his pain is managed with the oral regimen and he is glad that he is no longer on IV Dilaudid.  Reports that his appetite is well.  Patient reports that his sister will be leaving tomorrow morning to assist him at home.  Discharge planning likely tomorrow later in the afternoon.  OBJECTIVE:  Vital Signs: Vitals:   09/20/23 0740 09/20/23 1558 09/20/23 2020 09/21/23 0452  BP:  139/74 (!) 140/77 131/72  Pulse:  83 81 80  Resp:  16    Temp:  98.2 F (36.8 C) 98.4 F (36.9 C) 98.7 F (37.1 C)  TempSrc:  Oral Oral Oral  SpO2: 98% 98% 99% 97%  Weight:      Height:       Supplemental O2: Room Air SpO2: 97 %  Filed Weights   09/17/23 1937 09/17/23 2143  Weight: 92.7 kg 92.7 kg    No intake or output data in the 24 hours ending 09/21/23 1191 Net IO Since Admission: 870 mL [09/21/23 0633]  Physical Exam: Physical Exam  General: Sitting up in bed, eating breakfast, no acute distress Cardiovascular: Normal rate, normal rhythm, no murmurs/rubs or gallops Pulmonary: Normal work of breathing, lungs are clear to auscultation, no wheezing. Abdomen: Soft, nondistended, nontender, bowel sounds present MSK: Left leg is warm to touch, no acute tenderness upon palpation, left foot dressing on  Patient Lines/Drains/Airways Status     Active Line/Drains/Airways     Name Placement date Placement time Site  Days   Peripheral IV 09/18/23 18 G Left;Posterior Forearm 09/18/23  1657  Forearm  3   Peripheral IV 09/19/23 20 G 1.88" Right;Lateral Forearm 09/19/23  1936  Forearm  2   Incision (Closed) 09/18/23 Foot Left 09/18/23  1815  -- 3             ASSESSMENT/PLAN:  Assessment: Principal Problem:   Osteomyelitis (HCC) Active Problems:   PAD (peripheral artery disease) (HCC)   Ulcer of toe, left, with necrosis of bone (HCC)   #Acute osteomyelitis, septic arthritis and abscess of left third toe #S/p transmetatarsal amputation of left foot #PAD Presented to ED for left foot pain and fever, was seen by podiatrist on 12/9 concerns osteomyelitis of third digit of the left foot, discussed about surgical removal.  MR foot showed soft tissue ulceration along the dorsal aspect of third PIP joint with exposed bone.  Underlying acute osteomyelitis of the third proximal middle phalanges and third PIP joint septic arthritis.  Small 8 mm abscess along the medial aspect of the third proximal phalanx.  EDP consulted podiatrist, patient was taken to the OR on 12/11 for transmetatarsal amputation of the left foot.   POD 3, He currently lives alone, he is waiting for his sister to drive back from [facility] tomorrow, so he can have someone to take care of him at home. Otherwise no concerns at this time. Pending discharge tomorrow.    Plan:   -  P.o. doxycycline 500 mg twice daily 3/7  -P.o. Augmentin 875-125 mg BID 3/7 -PT/OT, nonweightbearing -Multimodal pain regimen as below -Continue Tylenol 1000 mg every 6 hours -Continue Advil 800 mg every 8 hours -Continue oxycodone 5 mg every 4 hours as needed breakthrough pain -Continue oxycodone 7.5 mg every 6 hours as needed for moderate pain -Senna and MiraLAX as bowel regimen  -Patient is restarted on his aspirin and Plavix which was held prior to his surgery. -TOC for home DME -Outpatient follow-up with podiatry on 12/18 -Outpatient follow-up with PCP on  12/19 -Would likely discharge Sunday   #Type 2 diabetes mellitus #Diabetic neuropathy Home medications include Trulicity weekly, Tresiba 20 units daily, empagliflozin 12.5 mg twice daily, metformin 1000 mg twice daily and Actos 30 mg daily. CBG this AM ~190.   Plan: -Semglee 20 units at bedtime, back to home dose -SSI with CBG checks  -Metformin 1000 mg twice daily -Start home dose Jardiance 25 mg daily - Hold Actos  -Continue Lyrica 100 mg 3 times daily   #Chronic stable conditions: Asthma/COPD: No wheezing or shortness of breath noted. Continue Proventil every 4 hours as needed for shortness of breath and wheezing, Incruse Ellipta/Breo Ellipta inhaler. CAD: Continue aspirin 81 mg atorvastatin 40 mg daily  Best Practice: Diet: Carb IVF: Fluids: None, Rate: None VTE: enoxaparin (LOVENOX) injection 40 mg Start: 09/19/23 1000 Code: Full AB: None Therapy Recs: None, DME: none DISPO: Anticipated discharge tomorrow to Home pending  Safe Dispo .  Signature: [name], D.O.  Internal Medicine Resident, PGY-1 [facility]  Pager: [phone] 6:33 AM, 09/21/2023   Please contact the on call pager after 5 pm and on weekends at [phone].

## 2023-09-22 ENCOUNTER — Other Ambulatory Visit (HOSPITAL_COMMUNITY): Payer: Self-pay

## 2023-09-22 ENCOUNTER — Telehealth: Payer: Self-pay | Admitting: Student

## 2023-09-22 LAB — GLUCOSE, CAPILLARY
Glucose-Capillary: 226 mg/dL — ABNORMAL HIGH (ref 70–99)
Glucose-Capillary: 116 mg/dL — ABNORMAL HIGH (ref 70–99)

## 2023-09-22 MED ORDER — CETAPHIL MOISTURIZING EX LOTN: TOPICAL_LOTION | CUTANEOUS | Status: DC | PRN

## 2023-09-22 MED ORDER — OXYCODONE HCL 7.5 MG PO TABS: 7.5000 mg | ORAL_TABLET | Freq: Three times a day (TID) | ORAL | 0 refills | Status: AC | PRN

## 2023-09-22 MED ORDER — LUBRIDERM SERIOUSLY SENSITIVE EX LOTN
TOPICAL_LOTION | CUTANEOUS | Status: DC | PRN
  Administered 2023-09-22: 1 via TOPICAL
  Filled 2023-09-22: qty 473

## 2023-09-22 MED ORDER — DOXYCYCLINE HYCLATE 100 MG PO TABS: 100.0000 mg | ORAL_TABLET | Freq: Two times a day (BID) | ORAL | 0 refills | Status: AC

## 2023-09-22 NOTE — Telephone Encounter (Signed)
Received message from floor RN that patient contacted them after discharge this afternoon. Patient states he did not receive two medications when looking at his TOC medication bag. He is missing doxycycline and oxycodone. I reviewed the orders and noted that  [facility] did not dispense the doxycycline or the oxycodone. Noted to have dispensed the Augmentin and Miralax. After confirming it on chart review, I will resend Rx for doxycycline and oxycodone to local pharmacy for pick up tonight ([facility] on [address]). Patient verbalizes understanding and all questions addressed.  -Doxycycline 100 mg BID x 4 days  -Oxycodone 7.5 mg Q8H PRN x 3 days for severe/breakthrough pain

## 2023-09-22 NOTE — Progress Notes (Signed)
Orthopedic Tech Progress Note Patient Details:  Richard [name] 02-13-1961 [id number]  Ortho Devices Type of Ortho Device: Postop shoe/boot Ortho Device/Splint Location: LLE Ortho Device/Splint Interventions: Ordered, Application, Adjustment, Removal   Post Interventions Patient Tolerated: Well Instructions Provided: Care of device, Adjustment of device  [name] 09/22/2023, 2:33 PM

## 2023-09-22 NOTE — Discharge Summary (Signed)
Name: Richard [name] MRN: [id number] DOB: 1961/06/25 62 y.o. PCP: [name], DO  Date of Admission: 09/17/2023  8:51 AM Date of Discharge: 09/22/23  Attending Physician: Dr.  Sol Blazing  Discharge Diagnosis: Principal Problem:   Osteomyelitis [facility]) Active Problems:   PAD (peripheral artery disease) (HCC)   Ulcer of toe, left, with necrosis of bone [facility])    Discharge Medications: Allergies as of 09/22/2023       Reactions   Shellfish Allergy Anaphylaxis, Shortness Of Breath        Medication List     STOP taking these medications    guaiFENesin 600 MG 12 hr tablet Commonly known as: MUCINEX   ipratropium-albuterol 0.5-2.5 (3) MG/3ML Soln Commonly known as: DUONEB       TAKE these medications    albuterol (2.5 MG/3ML) 0.083% nebulizer solution Commonly known as: PROVENTIL Take 3 mLs (2.5 mg total) by nebulization 2 (two) times daily.   albuterol 108 (90 Base) MCG/ACT inhaler Commonly known as: VENTOLIN HFA Inhale 1-2 puffs into the lungs every 4 (four) hours as needed for wheezing or shortness of breath.   amoxicillin-clavulanate 875-125 MG tablet Commonly known as: AUGMENTIN Take 1 tablet by mouth 2 (two) times daily for 4 days.   aspirin EC 81 MG tablet Take 1 tablet (81 mg total) by mouth daily.   atorvastatin 40 MG tablet Commonly known as: LIPITOR Take 1 tablet (40 mg total) by mouth daily.   Breztri Aerosphere 160-9-4.8 MCG/ACT Aero Generic drug: Budeson-Glycopyrrol-Formoterol Inhale 2 puffs into the lungs in the morning and at bedtime.   clopidogrel 75 MG tablet Commonly known as: PLAVIX TAKE 1 TABLET(75 MG) BY MOUTH DAILY   doxycycline 100 MG tablet Commonly known as: VIBRA-TABS Take 1 tablet (100 mg total) by mouth 2 (two) times daily for 4 days.   ferrous sulfate 325 (65 FE) MG tablet Take 1 tablet (325 mg total) by mouth daily.   fluticasone 50 MCG/ACT nasal spray Commonly known as: FLONASE Place 1 spray into both nostrils  daily.   FreeStyle Libre 3 Sensor Misc Place 1 sensor on the skin every 14 days. Use to check glucose continuously   insulin degludec 100 UNIT/ML FlexTouch Pen Commonly known as: TRESIBA Inject 20 Units into the skin daily.   meloxicam 15 MG tablet Commonly known as: MOBIC Take 1 tablet (15 mg total) by mouth daily as needed for pain.   montelukast 10 MG tablet Commonly known as: SINGULAIR Take 1 tablet (10 mg total) by mouth at bedtime.   mupirocin ointment 2 % Commonly known as: BACTROBAN Place 1 Application into the nose 2 (two) times daily.   Nucala 100 MG/ML Soaj Generic drug: Mepolizumab Inject 1 mL (100 mg total) into the skin every 28 (twenty-eight) days.   oxyCODONE HCl 7.5 MG Tabs Take 7.5 mg by mouth every 8 (eight) hours as needed for up to 3 days for moderate pain (pain score 4-6) or severe pain (pain score 7-10).   Pen Needles 32G X 4 MM Misc 1 each by Does not apply route daily.   pioglitazone 30 MG tablet Commonly known as: ACTOS Take 1 tablet (30 mg total) by mouth daily.   polyethylene glycol powder 17 GM/SCOOP powder Commonly known as: GLYCOLAX/MIRALAX Take 17 g by mouth daily.   pregabalin 100 MG capsule Commonly known as: LYRICA Take 1 capsule (100 mg total) by mouth 3 (three) times daily.   Synjardy 12.02-999 MG Tabs Generic drug: Empagliflozin-metFORMIN HCl Take 1 tablet by  mouth in the morning and at bedtime.   Trulicity 4.5 MG/0.5ML Soaj Generic drug: Dulaglutide Inject 4.5 mg as directed once a week.               Durable Medical Equipment  (From admission, onward)           Start     Ordered   09/20/23 1502  For home use only DME 3 n 1  Once        09/20/23 1501   09/20/23 1449  For home use only DME 3 n 1  Once        09/20/23 1448   09/20/23 1449  For home use only DME Walker rolling  Once       Question Answer Comment  Walker: With 5 Inch Wheels   Patient needs a walker to treat with the following condition  Weakness      09/20/23 1448   09/20/23 1116  For home use only DME Walker rolling  Once       Question Answer Comment  Walker: With 5 Inch Wheels   Patient needs a walker to treat with the following condition Physical deconditioning      09/20/23 1115            Disposition and follow-up:   [name] was discharged from [facility] in Good condition.  At the hospital follow up visit please address:  1.  Follow-up:  a. Left third toe acute osteomyelitis s/p transmetatarsal amputation: Patient is discharged with 4 days of doxycycline 500 mg twice daily, Augmentin twice daily.  He is also discharged with oxycodone 7.5 mg every 8 hours as needed for pain control for 3 days.  We also restarted his aspirin and Plavix on postop day 3.  Please evaluate patient's left lower foot for any signs of infection. Has follow-up with podiatry on 12/19.     b. T2DM: resumed home medications in hospital, continue outpatient management  2.  Labs / imaging needed at time of follow-up: none  3.  Pending labs/ test needing follow-up: wound culture pending final results  4.  Medication Changes  -Augmentin 875 mg BID x 4 days (EOT 12/18)  -Doxycycline 100 mg BID x 4 days (EOT 12/18) -Oxycodone 7.5 mg every 8 hours as needed for 3 days  Follow-up Appointments:  Follow-up Information     [name], DO. Go on 09/26/2023.   Specialty: Internal Medicine Why: AT 08:45 AM Contact information: [address] [place] [address] [phone]         Pilar Plate, DPM. Go on 09/26/2023.   Specialty: Podiatry Why: AT 2:00 PM for your foot Contact information: [address] [place] [address] [phone]                 Hospital Course by problem list: #Acute osteomyelitis, septic arthritis and abscess of left third toe #S/p transmetatarsal amputation of left foot #PAD Presented to ED for left foot pain and fever,  was seen by podiatrist on 12/9 concerns osteomyelitis of third digit of the left foot, discussed about surgical removal.  MR foot showed soft tissue ulceration along the dorsal aspect of third PIP joint with exposed bone.  Underlying acute osteomyelitis of the third proximal middle phalanges and third PIP joint septic arthritis.  Small 8 mm abscess along the medial aspect of the third proximal phalanx.  EDP consulted podiatrist, patient was taken to the OR  on 12/11 for transmetatarsal amputation of the left foot.  He started on IV vancomycin, transition to p.o. doxycycline 5 mg twice daily for 7 days and Augmentin twice daily for 7 days. Wound culture preliminary grew GNR but pending identification and susceptibilities at time of discharge. Patient working with PT and OT, nonweightbearing on left leg, no further follow-up.  We started him on multimodal pain regimen with scheduled Tylenol 1000 mg every 6 hours and schedule Advil 800 mg every 8 hours.  He was initially given IV Dilaudid doses for his pain, however has been transitioned off to p.o. oxycodone 7.5 mg every 6 hours as needed for moderate pain and oxycodone 5 mg for his breakthrough pain.  He is also being given senna and MiraLAX for bowel regimen.  Has an outpatient follow-up with podiatry on 12/19 and PCP on 12/19. Patient's sister will assist him at home and patient has walker and wheelchair.    #Type 2 diabetes mellitus on insulin #Diabetic neuropathy A1c 7.9. Home medications include Trulicity weekly, Tresiba 20 units daily, Synjardy 12.02-999 mg BID and Actos 30 mg daily. His home medications were resumed during hospital course except Trulicity and Actos which he can resume at discharge. Continued his Lyrica 100 mg 3 times daily.   #Chronic stable conditions: Asthma/COPD: No wheezing or shortness of breath noted. Continued triple inhaler therapy and Proventil every 4 hours as needed for shortness of breath and wheezing. CAD: Continued aspirin  81 mg, Plavix 75 mg and atorvastatin 40 mg daily.    Discharge Subjective: Patient evaluated this morning.  Endorses some left foot pain.  States the current pain regimen helps reduce the pain to a tolerable amount but does not take the pain completely away.  Discussed expectations for pain management which patient agrees.  Denies any new concerns at this time.  Eating breakfast and having bowel movements.  States he has his walker and wheelchair at home and his sister has arrived this morning.  Plan discussed with patient and all questions answered.  Discharge Exam:   BP 135/63 (BP Location: Right Arm)   Pulse 81   Temp 98.5 F (36.9 C) (Oral)   Resp 16   Ht 6\' 4"  (1.93 m)   Wt 92.7 kg   SpO2 99%   BMI 24.88 kg/m  Constitutional: well-appearing male sitting up in bed comfortably, in no acute distress HENT: normocephalic atraumatic Cardiovascular: Regular rate and rhythm Pulmonary/Chest: Normal work of breathing on room air, lungs clear to auscultation bilaterally Abdominal: Soft, non-tender MSK: Clean bandage/dressing around left foot with minimal leg swelling above bandage  Neurological: Alert & oriented x 3 Psych: Normal mood and affect  Pertinent Labs, Studies, and Procedures:     Latest Ref Rng & Units 09/19/2023    5:49 AM 09/18/2023    6:13 AM 09/17/2023    9:54 AM  CBC  WBC 4.0 - 10.5 K/uL 5.5  5.1  7.8   Hemoglobin 13.0 - 17.0 g/dL 02.7  25.3  66.4   Hematocrit 39.0 - 52.0 % 37.7  38.5  40.5   Platelets 150 - 400 K/uL 293  252  278        Latest Ref Rng & Units 09/19/2023    5:49 AM 09/18/2023    6:13 AM 09/17/2023    9:54 AM  CMP  Glucose 70 - 99 mg/dL 403  474  259   BUN 8 - 23 mg/dL 18  17  15    Creatinine 0.61 - 1.24 mg/dL  0.94  1.11  1.18   Sodium 135 - 145 mmol/L 135  133  136   Potassium 3.5 - 5.1 mmol/L 4.4  4.2  4.5   Chloride 98 - 111 mmol/L 104  103  102   CO2 22 - 32 mmol/L 23  22  25    Calcium 8.9 - 10.3 mg/dL 8.5  8.6  9.1     DG Foot 2  Views Left Result Date: 09/18/2023 CLINICAL DATA:  Status post amputation EXAM: LEFT FOOT - 2 VIEW COMPARISON:  09/17/2023 FINDINGS: There is been interval transmetatarsal amputation of the first through third metatarsals. Multiple antibiotic beads are noted. Surgical staples are seen in place. No erosive changes are noted. IMPRESSION: Status post metatarsal amputation without acute abnormality. Electronically Signed   By: Alcide Clever M.D.   On: 09/18/2023 23:36   MR FOOT LEFT WO CONTRAST Result Date: 09/17/2023 CLINICAL DATA:  Worsening left foot pain.  History of neuropathy. EXAM: MRI OF THE LEFT FOOT WITHOUT CONTRAST TECHNIQUE: Multiplanar, multisequence MR imaging of the left forefoot was performed. No intravenous contrast was administered. COMPARISON:  Left foot x-rays from same day. MRI left foot dated August 30, 2020. FINDINGS: Bones/Joint/Cartilage Prominent abnormal marrow edema with corresponding decreased T1 marrow signal involving the third proximal and middle phalanges around the PIP joint. Associated third PIP joint effusion. There is bony destruction of the third proximal phalanx head. No fracture or dislocation. Prior fourth and fifth ray amputations. Degenerative changes of the first IP joint. Ligaments Collateral ligaments are intact.  Lisfranc ligament is intact. Muscles and Tendons No tenosynovitis. Near complete fatty atrophy of the intrinsic muscles. Soft tissue Soft tissue ulceration along the dorsal aspect of third PIP joint with exposed bone. Small 8 x 5 x 7 mm fluid collection along the medial aspect of the third proximal phalanx (series 13, image 11; series 10, image 34). IMPRESSION: 1. Soft tissue ulceration along the dorsal aspect of third PIP joint with exposed bone. Underlying acute osteomyelitis of the third proximal and middle phalanges and third PIP joint septic arthritis. 2. Small 8 mm abscess along the medial aspect of the third proximal phalanx. Electronically Signed    By: Obie Dredge M.D.   On: 09/17/2023 18:50   DG Foot 2 Views Left Result Date: 09/17/2023 CLINICAL DATA:  Known infection. Worsening pain. Left middle toe. Scheduled for amputation tomorrow. EXAM: LEFT FOOT - 2 VIEW COMPARISON:  Left foot radiographs 09/16/2023, 12/2020 FINDINGS: Redemonstration of amputation of the fourth and fifth digits to the level of the mid metatarsal shafts. Resolution of the prior cortical thickening at the distal lateral shaft of the third metatarsal on 12/07/2020 radiographs, possibly previously cortical inflammatory change. There is again lucency within the distal aspect of the proximal phalanx of the third toe measuring up to approximately 8 mm in longitudinal dimension. There is again linear lucency and fragmentation of the adjacent distal lateral aspect of the proximal phalanx. Moderate third toe soft tissue swelling with apparent wound at the lateral aspect of the proximal third toe raising suspicion for osteomyelitis of the distal aspect of the proximal phalanx. Very mild joint space narrowing and dorsal osteophytosis of the first tarsometatarsal joint. Degenerative osteophytosis at the lateral base of the distal phalanx of the great toe is similar to prior. Moderate to high-grade atherosclerotic calcifications. IMPRESSION: Compared to 09/16/2023: Moderate third toe soft tissue swelling with apparent wound at the lateral aspect of the proximal third toe. Redemonstration of lucency within the distal aspect  of the proximal phalanx of the third toe with adjacent lateral cortical fragmentation. Findings are suspicious for osteomyelitis of the distal aspect of the third toe proximal phalanx. Electronically Signed   By: Neita Garnet M.D.   On: 09/17/2023 11:25     Discharge Instructions: Discharge Instructions     Call MD for:  difficulty breathing, headache or visual disturbances   Complete by: As directed    Call MD for:  hives   Complete by: As directed    Call MD  for:  persistant dizziness or light-headedness   Complete by: As directed    Call MD for:  persistant nausea and vomiting   Complete by: As directed    Call MD for:  redness, tenderness, or signs of infection (pain, swelling, redness, odor or green/yellow discharge around incision site)   Complete by: As directed    Call MD for:  severe uncontrolled pain   Complete by: As directed    Call MD for:  temperature >100.4   Complete by: As directed    Diet - low sodium heart healthy   Complete by: As directed    Discharge instructions   Complete by: As directed    [name] came to the hospital for left foot pain, you had a left foot amputation.  We treated you with antibiotics and pain medication.  For your left foot amputation -Augmentin, take 1 tablet by mouth 2 times daily for 4 days (start tonight 12/15) -Doxycycline, take 1 tablet by mouth 2 times daily for 4 days (start tonight 12/15) -For pain you can take oxycodone 7.5 mg every 8 hours as needed for pain control -You can also take over-the-counter Tylenol 1000 mg every 6 hours as needed for pain -You can take over-the-counter Advil 800 mg every 8 hours as needed for pain -Please use the boot for your left foot when you ambulate  Please follow-up with your podiatry on December 19  Please follow-up with your primary care doctor on December 19  Otherwise please continue the rest of your medications as prescribed  If you have any of these following symptoms, please call us or seek care at an emergency department: -Chest Pain -Difficulty Breathing -Worsening abdominal pain -Syncope (passing out) -Drooping of face -Slurred speech -Sudden weakness in your leg or arm -Fever -Chills -blood in the stool -dark black, sticky stool  If you have any questions or concerns, call our clinic at [phone] or after hours call [phone] and ask for the internal medicine resident on call.  I am glad you are feeling better. It  was a pleasure taking care for you. I wish a good recovery and good health!   Dr. [name]   Increase activity slowly   Complete by: As directed        Signed: Rana Snare, DO 09/22/2023, 9:33 AM   Pager: [phone]

## 2023-09-22 NOTE — Plan of Care (Signed)
Problem: Education: Goal: Ability to describe self-care measures that may prevent or decrease complications (Diabetes Survival Skills Education) will improve Outcome: Adequate for Discharge Goal: Individualized Educational Video(s) Outcome: Adequate for Discharge   Problem: Coping: Goal: Ability to adjust to condition or change in health will improve Outcome: Adequate for Discharge   Problem: Health Behavior/Discharge Planning: Goal: Ability to identify and utilize available resources and services will improve Outcome: Adequate for Discharge Goal: Ability to manage health-related needs will improve Outcome: Adequate for Discharge   Problem: Metabolic: Goal: Ability to maintain appropriate glucose levels will improve Outcome: Adequate for Discharge   Problem: Nutritional: Goal: Maintenance of adequate nutrition will improve Outcome: Adequate for Discharge Goal: Progress toward achieving an optimal weight will improve Outcome: Adequate for Discharge   Problem: Skin Integrity: Goal: Risk for impaired skin integrity will decrease Outcome: Adequate for Discharge   Problem: Tissue Perfusion: Goal: Adequacy of tissue perfusion will improve Outcome: Adequate for Discharge   Problem: Education: Goal: Knowledge of General Education information will improve Description: Including pain rating scale, medication(s)/side effects and non-pharmacologic comfort measures Outcome: Adequate for Discharge   Problem: Health Behavior/Discharge Planning: Goal: Ability to manage health-related needs will improve Outcome: Adequate for Discharge   Problem: Clinical Measurements: Goal: Ability to maintain clinical measurements within normal limits will improve Outcome: Adequate for Discharge Goal: Will remain free from infection Outcome: Adequate for Discharge Goal: Diagnostic test results will improve Outcome: Adequate for Discharge Goal: Respiratory complications will improve Outcome:  Adequate for Discharge Goal: Cardiovascular complication will be avoided Outcome: Adequate for Discharge   Problem: Activity: Goal: Risk for activity intolerance will decrease Outcome: Adequate for Discharge   Problem: Nutrition: Goal: Adequate nutrition will be maintained Outcome: Adequate for Discharge   Problem: Coping: Goal: Level of anxiety will decrease Outcome: Adequate for Discharge   Problem: Elimination: Goal: Will not experience complications related to bowel motility Outcome: Adequate for Discharge Goal: Will not experience complications related to urinary retention Outcome: Adequate for Discharge   Problem: Pain Management: Goal: General experience of comfort will improve Outcome: Adequate for Discharge   Problem: Safety: Goal: Ability to remain free from injury will improve Outcome: Adequate for Discharge   Problem: Skin Integrity: Goal: Risk for impaired skin integrity will decrease Outcome: Adequate for Discharge   Problem: Education: Goal: Knowledge of General Education information will improve Description: Including pain rating scale, medication(s)/side effects and non-pharmacologic comfort measures Outcome: Adequate for Discharge   Problem: Health Behavior/Discharge Planning: Goal: Ability to manage health-related needs will improve Outcome: Adequate for Discharge   Problem: Clinical Measurements: Goal: Ability to maintain clinical measurements within normal limits will improve Outcome: Adequate for Discharge Goal: Will remain free from infection Outcome: Adequate for Discharge Goal: Diagnostic test results will improve Outcome: Adequate for Discharge Goal: Respiratory complications will improve Outcome: Adequate for Discharge Goal: Cardiovascular complication will be avoided Outcome: Adequate for Discharge   Problem: Activity: Goal: Risk for activity intolerance will decrease Outcome: Adequate for Discharge   Problem: Nutrition: Goal:  Adequate nutrition will be maintained Outcome: Adequate for Discharge   Problem: Coping: Goal: Level of anxiety will decrease Outcome: Adequate for Discharge   Problem: Elimination: Goal: Will not experience complications related to bowel motility Outcome: Adequate for Discharge Goal: Will not experience complications related to urinary retention Outcome: Adequate for Discharge   Problem: Pain Management: Goal: General experience of comfort will improve Outcome: Adequate for Discharge   Problem: Safety: Goal: Ability to remain free from injury will improve Outcome:  Adequate for Discharge   Problem: Skin Integrity: Goal: Risk for impaired skin integrity will decrease Outcome: Adequate for Discharge

## 2023-09-22 NOTE — Progress Notes (Signed)
TOC meds delivered to pt bedside. AVS reviewed and opportunity for questions was given. Education on after discharge and medication reviewed with pt. Pt expressed needing another type of ortho device d/t large bandage to foot and cam boot not fitting yet. Ortho shoe ordered. Pt can d/c once ortho boot is delivered.

## 2023-09-22 NOTE — Progress Notes (Signed)
Pts d/c delayed d/t pts sister driving into town to stay with pt. MD aware. Per MD, pt not to d/c until he can confirm his sister has arrived at his house.

## 2023-09-22 NOTE — Progress Notes (Signed)
1725: Pt called [facility] nursing desk stating he only has 3 medications in his TOC medication bag he received from pharmacy today prior to discharge: amoxicillin-clavulanate, mupirocin ointment, and polyethylene glycol powder. Pt states he did not receive and does not have the oxycodone or the doxycycline medications. Pt c/o pain r/t amputation. Reports he took Tylenol with no relief. Rana Snare, DO notified and stated he would call pt to discuss.

## 2023-09-23 ENCOUNTER — Other Ambulatory Visit (HOSPITAL_COMMUNITY): Payer: Self-pay

## 2023-09-23 ENCOUNTER — Telehealth: Payer: Self-pay | Admitting: *Deleted

## 2023-09-23 ENCOUNTER — Telehealth: Payer: Self-pay

## 2023-09-23 NOTE — Telephone Encounter (Signed)
Call from patient stating  that he was calling to let Dr. Merrilee Jansky know that he has the Doxy and the other antibiotic and is taking both.

## 2023-09-23 NOTE — Telephone Encounter (Signed)
I called pt who stated he called [facility] and he was told his insurance will not pay for Doxycycline b/c it was filled at [facility] pharmacy. He tried to explain to [employer] he did not pick up rx from [facility] pharmacy. And now he doe not know what to do.

## 2023-09-23 NOTE — Telephone Encounter (Signed)
error 

## 2023-09-23 NOTE — Telephone Encounter (Signed)
Requesting to speak with a nurse about antibiotic, states he supposed to have two antibiotic but he only received one. Please call pt back.

## 2023-09-23 NOTE — Transitions of Care (Post Inpatient/ED Visit) (Signed)
09/23/2023  Name: Richard [name] MRN: [id number] DOB: 09/03/1961  Today's TOC FU Call Status: Today's TOC FU Call Status:: Successful TOC FU Call Completed TOC FU Call Complete Date: 09/23/23 Patient's Name and Date of Birth confirmed.  Transition Care Management Follow-up Telephone Call Date of Discharge: 09/22/23 Discharge Facility: [facility]) Type of Discharge: Inpatient Admission Primary Inpatient Discharge Diagnosis:: Osteomyelitis How have you been since you were released from the hospital?: Better  Items Reviewed: Did you receive and understand the discharge instructions provided?: Yes Medications obtained,verified, and reconciled?: Yes (Medications Reviewed) Any new allergies since your discharge?: No Dietary orders reviewed?: Yes Type of Diet Ordered:: Reg Carb Controlled Heart healthy Do you have support at home?: Yes People in Home: child(ren), adult, sibling(s) Name of Support/Comfort Primary Source: [name], SisterForde Dandy  Medications Reviewed Today: Medications Reviewed Today     Reviewed by [name], RN (Registered Nurse) on 09/23/23 at 1523  Med List Status: <None>   Medication Order Taking? Sig Documenting Provider Last Dose Status Informant  albuterol (PROVENTIL) (2.5 MG/3ML) 0.083% nebulizer solution [id number] Yes Take 3 mLs (2.5 mg total) by nebulization 2 (two) times daily. [name], DO Taking Active Self, Pharmacy Records  albuterol (VENTOLIN HFA) 108 (90 Base) MCG/ACT inhaler [id number] Yes Inhale 1-2 puffs into the lungs every 4 (four) hours as needed for wheezing or shortness of breath. [name], MD Taking Active Self, Pharmacy Records  amoxicillin-clavulanate (AUGMENTIN) 875-125 MG tablet [id number] Yes Take 1 tablet by mouth 2 (two) times daily for 4 days. [name], DO Taking Active   aspirin EC 81 MG EC tablet [id number] Yes Take 1 tablet (81 mg total) by mouth daily. [name], MD Taking Active Self, Pharmacy  Records  atorvastatin (LIPITOR) 40 MG tablet [id number] Yes Take 1 tablet (40 mg total) by mouth daily. [name], DO Taking Active Self, Pharmacy Records  Budeson-Glycopyrrol-Formoterol [facility] AEROSPHERE) 160-9-4.8 MCG/ACT [name] [id number] Yes Inhale 2 puffs into the lungs in the morning and at bedtime. [name], MD Taking Active Self, Pharmacy Records  clopidogrel (PLAVIX) 75 MG tablet [id number] Yes TAKE 1 TABLET(75 MG) BY MOUTH DAILY [name], DO Taking Active Self, Pharmacy Records  Continuous Glucose Sensor (FREESTYLE LIBRE 3 SENSOR) [place] [id number] Yes Place 1 sensor on the skin every 14 days. Use to check glucose continuously [name], DO Taking Active Self, Pharmacy Records  doxycycline (VIBRA-TABS) 100 MG tablet [id number] Yes Take 1 tablet (100 mg total) by mouth 2 (two) times daily for 4 days. Rana Snare, DO Taking Active   Dulaglutide (TRULICITY) 4.5 MG/0.5ML SOPN [id number] Yes Inject 4.5 mg as directed once a week. [name], DO Taking Active Self, Pharmacy Records           Med Note [facility] [name] A   Tue Sep 17, 2023  4:16 PM) On Saturdays  Empagliflozin-metFORMIN HCl [facility]) 12.02-999 MG TABS [id number] Yes Take 1 tablet by mouth in the morning and at bedtime. [name], MD Taking Active Self, Pharmacy Records  ferrous sulfate 325 (65 FE) MG tablet [id number] Yes Take 1 tablet (325 mg total) by mouth daily. [name], DO Taking Active Self, Pharmacy Records  fluticasone [facility]) 50 MCG/ACT nasal spray [id number] No Place 1 spray into both nostrils daily.  Patient not taking: Reported on 09/23/2023   [name], DO Not Taking Active Self, Pharmacy Records           Med Note ([name] L  Mon Sep 23, 2023  3:21 PM) Seasonal   insulin degludec (TRESIBA) 100 UNIT/ML FlexTouch Pen [id number] Yes Inject 20 Units into the skin daily. [name], DO Taking Active Self, Pharmacy Records  Insulin Pen Needle (PEN NEEDLES) 32G X 4  MM MISC [id number] Yes 1 each by Does not apply route daily. [name], DO Taking Active Self, Pharmacy Records  meloxicam [facility]) 15 MG tablet [id number] Yes Take 1 tablet (15 mg total) by mouth daily as needed for pain. [name], MD Taking Active Self, Pharmacy Records  Mepolizumab [facility]) 100 MG/ML Ivory Broad [id number] Yes Inject 1 mL (100 mg total) into the skin every 28 (twenty-eight) days. [name], MD Taking Active Self, Pharmacy Records           Med Note [facility] [place] [place] A   Tue Sep 17, 2023  4:18 PM)    montelukast (SINGULAIR) 10 MG tablet [id number] Yes Take 1 tablet (10 mg total) by mouth at bedtime. [name], MD Taking Active Self, Pharmacy Records  mupirocin ointment [facility]) 2 % [id number] Yes Place 1 Application into the nose 2 (two) times daily. [name], DO Taking Active   oxyCODONE HCl 7.5 MG TABS [id number] Yes Take 7.5 mg by mouth every 8 (eight) hours as needed for up to 3 days. Rana Snare, DO Taking Active   pioglitazone (ACTOS) 30 MG tablet [id number] Yes Take 1 tablet (30 mg total) by mouth daily. [name], MD Taking Active Self, Pharmacy Records  polyethylene glycol powder [facility]) 17 GM/SCOOP powder [id number] Yes Take 17 g by mouth daily. [name], DO Taking Active   pregabalin (LYRICA) 100 MG capsule [id number] Yes Take 1 capsule (100 mg total) by mouth 3 (three) times daily. [name], DO Taking Active Self, Pharmacy Records            Home Care and Equipment/Supplies: Were Home Health Services Ordered?: No Any new equipment or medical supplies ordered?: No  Functional Questionnaire: Do you need assistance with bathing/showering or dressing?: No Do you need assistance with meal preparation?: No Do you need assistance with eating?: No Do you have difficulty maintaining continence: No Do you need assistance with getting out of bed/getting out of a chair/moving?: No Do you have difficulty managing or  taking your medications?: No (verified meds, doses)  Follow up appointments reviewed: PCP Follow-up appointment confirmed?: Yes Date of PCP follow-up appointment?: 09/26/23 Follow-up Provider: Surgicsl follow-up Specialist Hospital Follow-up appointment confirmed?: Yes Date of Specialist follow-up appointment?: 09/26/23 Follow-Up Specialty Provider:: Surgical Do you need transportation to your follow-up appointment?: No (Sister or daughter can transport) Do you understand care options if your condition(s) worsen?: Yes-patient verbalized understanding  SDOH Interventions Today    Flowsheet Row Most Recent Value  SDOH Interventions   Food Insecurity Interventions Intervention Not Indicated  Housing Interventions Intervention Not Indicated  Transportation Interventions Intervention Not Indicated  Utilities Interventions Intervention Not Indicated       Goals Addressed             This Visit's Progress    TOC Care Plan       Current Barriers:  Medication management pain med and ABT Provider appointments Surgical and PCP Chronic Disease Management support and education 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   Interventions: Evaluation of current treatment plan related to  self management and patient's adherence to plan as established by provider  Transitions of Care:  New goal. Doctor Visits  - discussed the importance of doctor visits  Patient  Goals/Self-Care Activities: Participate in Transition of Care Program/Attend [facility] scheduled calls Take all medications as prescribed Call pharmacy for medication refills 3-7 days in advance of running out of medications Perform all self care activities independently  Perform IADL's (shopping, preparing meals, housekeeping, managing finances) independently Call provider office for new concerns or questions   Follow Up Plan:  Telephone follow up appointment with care management team member scheduled for:  10/03/23 11am with Armanda Heritage The patient has been provided with contact information for the care management team and has been advised to call with any health related questions or concerns.           Discussed [facility]I  TOC program and weekly calls to patient to assess condition/status, medication management  and provide support/education as indicated . Patient/ Caregiver voiced understanding and is  agreeable to 30 day program   Routine follow-up and on-going assessment evaluation and education of disease processes, recommended interventions for both chronic and acute medical conditions , will occur during each weekly visit along with ongoing review of symptoms ,medication reviews and reconciliation. Any updates , inconsistencies, discrepancies or acute care concerns will be addressed and routed to the correct Practitioner if indicated   Based on current information and Insurance plan -Reviewed benefits available to patient, including details about eligibility options for care if any area of needs were identified.  Reviewed patients ability to access and / or navigating the benefits system..Amb Referral made if indicted , refer to orders section of note for details   Please refer to Care Plan for goals and interventions -Effectiveness of interventions, symptom management and outcomes will be evaluated  weekly during [facility] 30-day Program Outreach calls  . Any necessary  changes and updates to Care Plan  will be completed episodically    Reviewed goals for care Patient verbalizes understanding of instructions and care plan provided. Patient was encouraged to make informed decisions about their care, actively participate in managing their health condition, and implement lifestyle changes as needed to promote independence and self-management of health care    The patient has been provided with contact information for the care management team and has been advised to call with any health-related questions or concerns.     Susa Loffler , BSN, RN Care Management Coordinator [place]   [facility] [email or web address] Direct Dial: [phone]

## 2023-09-23 NOTE — Patient Instructions (Signed)
Visit Information  Thank you for taking time to visit with me today. Please don't hesitate to contact me if I can be of assistance to you before our next scheduled telephone appointment.  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   Interventions: Evaluation of current treatment plan related to  self management and patient's adherence to plan as established by provider  Transitions of Care:  New goal. Doctor Visits  - discussed the importance of doctor visits  Patient Goals/Self-Care Activities: Participate in Transition of Care Program/Attend [facility] scheduled calls Take all medications as prescribed Call pharmacy for medication refills 3-7 days in advance of running out of medications Perform all self care activities independently  Perform IADL's (shopping, preparing meals, housekeeping, managing finances) independently Call provider office for new concerns or questions   Follow Up Plan:  Telephone follow up appointment with care management team member scheduled for:  10/03/23 11am with Armanda Heritage The patient has been  provided with contact information for the care management team and has been advised to call with any health related questions or concerns.          Reviewed goals for care Patient/ Caregiver  verbalizes understanding of instructions and care plan provided. Patient / Caregiver was encouraged to make informed decisions about care, actively participate in managing health conditions, and implement lifestyle changes as needed to promote independence and self-management of health care   VBCI Case Management Nurse will provide follow-up and on-going assessment evaluation and education of disease processes, recommended interventions for both chronic and acute medical conditions ,  along with ongoing review of symptoms ,medication reviews and reconciliation during each weekly  call .  Any updates , inconsistencies, discrepancies or acute care concerns will be addressed and routed to the correct Practitioner if indicated    Please call the care guide team at [phone] , or call me directly at the number below if you need to cancel or reschedule your appointment. Three attempts will be made to reach patient if the scheduled call is missed. If we are unable to reach the patient after 3 attempts the case will be closed and no additional outreach attempts will be made.   If you are experiencing a medical emergency, please call 911 or report to your local emergency department or urgent care.    If you have a non-emergency medical problem during routine business hours, please contact your provider's office and ask to speak with a nurse.    If you are experiencing a Mental Health or Behavioral Health Crisis or need someone  to talk to Please call the Suicide and Crisis Lifeline: 988 You may also call the [place] National Suicide Prevention Lifeline: (514)496-2577 or TTY: 732-101-7102 TTY (252)590-4110) to talk to a trained counselor.  Additionally you may call the Behavioral Health Crisis Line at  570 280 2480, at any time, 24 hours a day, 7 days a week- however If you are in danger or need immediate medical attention, call 911.   If you would like help to quit smoking, call 1-800-QUIT-NOW ( 573-699-6763) OR Espaol: 1-855-Djelo-Ya (3-474-259-5638) o para ms informacin haga clic aqu or Text READY to 756-433 to register via text.  Susa Loffler , BSN, RN Care Management Coordinator [place]   [facility] [email or web address] Direct Dial: [phone]

## 2023-09-24 LAB — AEROBIC/ANAEROBIC CULTURE W GRAM STAIN (SURGICAL/DEEP WOUND)

## 2023-09-25 ENCOUNTER — Ambulatory Visit: Payer: 59 | Admitting: Podiatry

## 2023-09-25 ENCOUNTER — Ambulatory Visit: Payer: 59

## 2023-09-25 VITALS — Ht 75.0 in | Wt 204.0 lb

## 2023-09-25 DIAGNOSIS — Z0000 Encounter for general adult medical examination without abnormal findings: Secondary | ICD-10-CM

## 2023-09-25 NOTE — Patient Instructions (Signed)
Richard [name] , Thank you for taking time to come for your Medicare Wellness Visit. I appreciate your ongoing commitment to your health goals. Please review the following plan we discussed and let me know if I can assist you in the future.   Referrals/Orders/Follow-Ups/Clinician Recommendations: Remember you are due for a colonoscopy. You also have a referral already placed for an eye doctor, ask your provider where you have been referred to.    This is a list of the screening recommended for you and due dates:  Health Maintenance  Topic Date Due   Eye exam for diabetics  08/01/2022   Colon Cancer Screening  01/24/2023   COVID-19 Vaccine (4 - 2024-25 season) 06/09/2023   Flu Shot  09/26/2023*   Yearly kidney health urinalysis for diabetes  11/29/2023   Hemoglobin A1C  12/18/2023   Screening for Lung Cancer  02/10/2024   Complete foot exam   03/31/2024   Yearly kidney function blood test for diabetes  09/18/2024   Medicare Annual Wellness Visit  09/24/2024   DTaP/Tdap/Td vaccine (2 - Td or Tdap) 09/19/2028   Hepatitis C Screening  Completed   HIV Screening  Completed   Zoster (Shingles) Vaccine  Completed   HPV Vaccine  Aged Out  *Topic was postponed. The date shown is not the original due date.    Advanced directives: (In Chart) A copy of your advanced directives are scanned into your chart should your provider ever need it.  Next Medicare Annual Wellness Visit scheduled for next year: Yes
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
## 2023-09-26 ENCOUNTER — Ambulatory Visit (INDEPENDENT_AMBULATORY_CARE_PROVIDER_SITE_OTHER): Payer: 59 | Admitting: Podiatry

## 2023-09-26 ENCOUNTER — Ambulatory Visit: Payer: 59 | Admitting: Internal Medicine

## 2023-09-26 ENCOUNTER — Encounter: Payer: 59 | Admitting: Podiatry

## 2023-09-26 ENCOUNTER — Encounter: Payer: Self-pay | Admitting: Internal Medicine

## 2023-09-26 VITALS — BP 138/70 | HR 91 | Temp 98.1°F | Ht 75.0 in | Wt 226.9 lb

## 2023-09-26 DIAGNOSIS — Z794 Long term (current) use of insulin: Secondary | ICD-10-CM | POA: Diagnosis not present

## 2023-09-26 DIAGNOSIS — Z89432 Acquired absence of left foot: Secondary | ICD-10-CM

## 2023-09-26 DIAGNOSIS — D509 Iron deficiency anemia, unspecified: Secondary | ICD-10-CM | POA: Diagnosis not present

## 2023-09-26 DIAGNOSIS — J4489 Other specified chronic obstructive pulmonary disease: Secondary | ICD-10-CM | POA: Diagnosis not present

## 2023-09-26 DIAGNOSIS — E1140 Type 2 diabetes mellitus with diabetic neuropathy, unspecified: Secondary | ICD-10-CM

## 2023-09-26 DIAGNOSIS — E1151 Type 2 diabetes mellitus with diabetic peripheral angiopathy without gangrene: Secondary | ICD-10-CM

## 2023-09-26 DIAGNOSIS — I739 Peripheral vascular disease, unspecified: Secondary | ICD-10-CM

## 2023-09-26 MED ORDER — CLOPIDOGREL BISULFATE 75 MG PO TABS: 75.0000 mg | ORAL_TABLET | Freq: Every day | ORAL | 3 refills | Status: DC

## 2023-09-26 MED ORDER — OXYCODONE-ACETAMINOPHEN 5-325 MG PO TABS: 1.0000 | ORAL_TABLET | ORAL | 0 refills | Status: AC | PRN

## 2023-09-26 MED ORDER — PREGABALIN 100 MG PO CAPS: 100.0000 mg | ORAL_CAPSULE | Freq: Three times a day (TID) | ORAL | 2 refills | Status: DC

## 2023-09-26 NOTE — Assessment & Plan Note (Signed)
Actually doing much better

## 2023-09-26 NOTE — Progress Notes (Signed)
Established Patient Office Visit  Subjective   Patient ID: Richard [name], male    DOB: [birth year]  Age: 62 y.o. MRN: [id number]  Chief Complaint  Patient presents with   Follow-up    S/P surgery.   Medication Refill   Richard [name] is here for follow-up after recent hospitalization for osteomyelitis which resulted in metatarsal amputation of the third toe of the left foot by Dr. Denman George.  He was admitted to the hospital from December 10 to September 22, 2023.  Our TCM team first reach out to him on 12/16.  At first she had some difficulty in filling his doxycycline prescription but we were able to resolve this. Follow-up recommendations were to ensure antibiotics course was completed, diabetes management and follow-up wound culture.  His wound culture grew Enterobacter cloacae which Augmentin appropriately covered for.  He is present today with his Sister [name].  He reports he is feeling much better his pain is better control.  He has follow-up later this afternoon with podiatry     Objective:     BP 138/70 (BP Location: Right Arm, Patient Position: Sitting, Cuff Size: Normal)   Pulse 91   Temp 98.1 F (36.7 C) (Oral)   Ht 6\' 3"  (1.905 m)   Wt 226 lb 14.4 oz (102.9 kg)   SpO2 100% Comment: RA  BMI 28.36 kg/m  BP Readings from Last 3 Encounters:  09/26/23 138/70  09/22/23 135/63  09/07/23 135/74   Wt Readings from Last 3 Encounters:  09/26/23 226 lb 14.4 oz (102.9 kg)  09/25/23 204 lb (92.5 kg)  09/17/23 204 lb 5.9 oz (92.7 kg)      Physical Exam Constitutional:      Appearance: Normal appearance.  Cardiovascular:     Rate and Rhythm: Normal rate and regular rhythm.  Pulmonary:     Effort: Pulmonary effort is normal.     Breath sounds: Normal breath sounds. No wheezing.  Musculoskeletal:     Comments: Left foot bandaged, no warmth or swelling above bandage.  Right knee with minor crepitus, no gross joint effusion, tenderness on medial and lateral tibial  pleateu  Neurological:     Mental Status: He is alert.  Psychiatric:        Mood and Affect: Mood normal.        Behavior: Behavior normal.      No results found for any visits on 09/26/23.  Last CBC Lab Results  Component Value Date   WBC 5.5 09/19/2023   HGB 11.9 (L) 09/19/2023   HCT 37.7 (L) 09/19/2023   MCV 81.6 09/19/2023   MCH 25.8 (L) 09/19/2023   RDW 14.8 09/19/2023   PLT 293 09/19/2023   Last metabolic panel Lab Results  Component Value Date   GLUCOSE 261 (H) 09/19/2023   NA 135 09/19/2023   K 4.4 09/19/2023   CL 104 09/19/2023   CO2 23 09/19/2023   BUN 18 09/19/2023   CREATININE 0.94 09/19/2023   GFRNONAA >60 09/19/2023   CALCIUM 8.5 (L) 09/19/2023   PHOS 4.9 (H) 04/08/2023   PROT 7.1 09/04/2023   ALBUMIN 3.7 09/04/2023   LABGLOB 2.5 08/17/2021   AGRATIO 1.7 08/17/2021   BILITOT 0.6 09/04/2023   ALKPHOS 81 09/04/2023   AST 20 09/04/2023   ALT 21 09/04/2023   ANIONGAP 8 09/19/2023   Last lipids Lab Results  Component Value Date   CHOL 165 11/22/2022   HDL 78 11/22/2022   LDLCALC 80 11/22/2022   TRIG 34 11/22/2022  CHOLHDL 2.1 11/22/2022   Last hemoglobin A1c Lab Results  Component Value Date   HGBA1C 7.9 (H) 09/19/2023      The 10-year ASCVD risk score (Arnett DK, et al., 2019) is: 16.5%    Assessment & Plan:   Problem List Items Addressed This Visit       Cardiovascular and Mediastinum   Type 2 diabetes mellitus with peripheral artery disease (HCC) - Primary (Chronic)   A1c recently checked at hosptial was 7.9. review of CGM on his phone looks like his blood sugar more recently is trending better. Needs A1c below 7.5% for right knee replacement.  Discussed with him allowing this to heal over the next month then we can review CGM data and see if glycemic control is good enough for surgery.  -Remove actos from med list. -Continue synjardy 1000-12.5 BID -Continue trulicity 4.5 - Continue tresiba 20 u daily      Peripheral  arterial disease (HCC) (Chronic)   Has been on DAPT since feburary. With recent foot surgery do not want to go down to single agent yet.  But over the next several weeks to months will consider decreasing to plavix alone.      Relevant Medications   clopidogrel (PLAVIX) 75 MG tablet     Respiratory   Asthma-COPD overlap syndrome (HCC) (Chronic)   Actually doing much better        Endocrine   Diabetic neuropathy, painful (HCC) (Chronic)   Refill lyrica      Relevant Medications   pregabalin (LYRICA) 100 MG capsule     Other   Iron deficiency anemia   Recheck iron stores, may need repeat infusion.      Relevant Orders   Ambulatory referral to Gastroenterology   Iron, TIBC and Ferritin Panel   CBC no Diff    Return in about 4 weeks (around 10/24/2023).    [name], DO

## 2023-09-26 NOTE — Assessment & Plan Note (Signed)
Refill lyrica

## 2023-09-26 NOTE — Assessment & Plan Note (Signed)
Has been on DAPT since feburary. With recent foot surgery do not want to go down to single agent yet.  But over the next several weeks to months will consider decreasing to plavix alone.

## 2023-09-26 NOTE — Assessment & Plan Note (Signed)
A1c recently checked at hosptial was 7.9. review of CGM on his phone looks like his blood sugar more recently is trending better. Needs A1c below 7.5% for right knee replacement.  Discussed with him allowing this to heal over the next month then we can review CGM data and see if glycemic control is good enough for surgery.  -Remove actos from med list. -Continue synjardy 1000-12.5 BID -Continue trulicity 4.5 - Continue tresiba 20 u daily

## 2023-09-26 NOTE — Patient Instructions (Addendum)
Call [facility] about scheduling your colonoscopy.

## 2023-09-26 NOTE — Progress Notes (Signed)
  Subjective:  Patient ID: Richard [name], male    DOB: October 22, 1960,  MRN: [id number]   DOS: 09/18/23 Procedure: Left foot Trans metatarsal amputation, tendo achilles lengthening  62 y.o. male seen for post op check. Seen for first office f/u. Has been NWB to the left foot. Not wearing cam boot as dressing to bulky to accommodate.  Having pain to left foot. Says it hurts at night due to positional. Finished course of abx. Denies concerns.   Review of Systems: Negative except as noted in the HPI. Denies N/V/F/Ch.   Objective:  There were no vitals filed for this visit. There is no height or weight on file to calculate BMI. Constitutional Well developed. Well nourished.  Vascular Foot warm and well perfused. Capillary refill normal to all digits.   No calf pain with palpation  Neurologic Normal speech. Oriented to person, place, and time. Epicritic sensation Absent to forefoot.  Dermatologic Amputation site healing well no dehisence. No drainage or erythema.   Orthopedic: S/p Transmet amp to the left foot.   Radiographs: Deferred today. Hosptial Xr demonstrated: Status post metatarsal amputation without acute abnormality.   Pathology: Acute and chronic osteomyelitis  Micro: E Cloacae.   Assessment:  S/p L foot TMA, TAL Plan:  Patient was evaluated and treated and all questions answered.  POD # 8 s/p L foot TMA, TAL -Progressing very well, surgical site healing well -XR: Deferred at this visit -WB Status: NWB in CAM boot to LLE, soft dressing ok if can't get in boot due to dressing -Sutures: to remain intact until next apt, possibly can be removed then. -Medications/ABX: Finished course of abx, no indication for more at this time -Foot redressed. - Return in 1 week for dsg change, possible staple removal        [name], DPM [facility] / [facility]

## 2023-09-26 NOTE — Assessment & Plan Note (Signed)
Recheck iron stores, may need repeat infusion.

## 2023-09-27 LAB — CBC
Hematocrit: 39.6 % (ref 37.5–51.0)
Hemoglobin: 12.5 g/dL — ABNORMAL LOW (ref 13.0–17.7)
MCH: 25.2 pg — ABNORMAL LOW (ref 26.6–33.0)
MCHC: 31.6 g/dL (ref 31.5–35.7)
MCV: 80 fL (ref 79–97)
Platelets: 368 10*3/uL (ref 150–450)
RBC: 4.96 x10E6/uL (ref 4.14–5.80)
RDW: 13.6 % (ref 11.6–15.4)
WBC: 4.9 10*3/uL (ref 3.4–10.8)

## 2023-09-27 LAB — IRON,TIBC AND FERRITIN PANEL
Ferritin: 84 ng/mL (ref 30–400)
Iron Saturation: 16 % (ref 15–55)
Iron: 57 ug/dL (ref 38–169)
Total Iron Binding Capacity: 355 ug/dL (ref 250–450)
UIBC: 298 ug/dL (ref 111–343)

## 2023-09-30 ENCOUNTER — Other Ambulatory Visit: Payer: Self-pay

## 2023-09-30 DIAGNOSIS — J4489 Other specified chronic obstructive pulmonary disease: Secondary | ICD-10-CM

## 2023-09-30 MED ORDER — ALBUTEROL SULFATE HFA 108 (90 BASE) MCG/ACT IN AERS: 1.0000 | INHALATION_SPRAY | RESPIRATORY_TRACT | 2 refills | Status: DC | PRN

## 2023-10-03 ENCOUNTER — Other Ambulatory Visit: Payer: Self-pay

## 2023-10-03 ENCOUNTER — Telehealth: Payer: Self-pay

## 2023-10-03 ENCOUNTER — Ambulatory Visit: Payer: 59 | Admitting: Podiatry

## 2023-10-03 ENCOUNTER — Encounter: Payer: Self-pay | Admitting: Podiatry

## 2023-10-03 VITALS — Ht 75.0 in | Wt 226.9 lb

## 2023-10-03 DIAGNOSIS — Z89432 Acquired absence of left foot: Secondary | ICD-10-CM

## 2023-10-03 NOTE — Progress Notes (Signed)
  Subjective:  Patient ID: Richard [name], male    DOB: 04-Oct-1961,  MRN: [id number]   DOS: 09/18/23 Procedure: Left foot Trans metatarsal amputation, tendo achilles lengthening  62 y.o. male seen for post op check. Seen for second office follow-up.  Has been nonweightbearing to the left foot.  Has wearing cam boot.  Reports having nerve pain burning tingling sensations to the left foot amputation site otherwise no concerns.  Review of Systems: Negative except as noted in the HPI. Denies N/V/F/Ch.   Objective:  There were no vitals filed for this visit. There is no height or weight on file to calculate BMI. Constitutional Well developed. Well nourished.  Vascular Foot warm and well perfused. Capillary refill normal to all digits.   No calf pain with palpation  Neurologic Normal speech. Oriented to person, place, and time. Epicritic sensation Absent to forefoot.  Dermatologic Amputation site healing well no dehisence. No drainage or erythema. Upon removal of some of the sutures there was central area of dehiscence.  Attempted Steri-Strips initially but there was some bleeding through this so through a few Prolene suture.   Orthopedic: S/p Transmet amp to the left foot.   Radiographs: Deferred today. Hosptial Xr demonstrated: Status post metatarsal amputation without acute abnormality.   Pathology: Acute and chronic osteomyelitis  Micro: E Cloacae.   Assessment:  S/p L foot TMA, TAL Plan:  Patient was evaluated and treated and all questions answered.  POD # 17 s/p L foot TMA, TAL -Progressing very well, surgical site healing well -mild incisional dehiscence -XR: Deferred at this visit -WB Status: Partial heel weightbearing to the left foot in cam boot -Sutures: Removed some staples today however did not remove all sutures or staples due to mild dehiscence at the time -Medications/ABX: Finished course of abx, no indication for more at this time -Foot redressed. - Return in  2 weeks for dressing changes further stage suture suture and staple removal        [name], DPM [facility] / [facility]

## 2023-10-03 NOTE — Patient Instructions (Signed)
Visit Information  Thank you for taking time to visit with me today. Please don't hesitate to contact me if I can be of assistance to you.   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   Interventions: Evaluation of current treatment plan related to  self management and patient's adherence to plan as established by provider  Transitions of Care:  Goal on track:  Yes. Doctor Visits  - discussed the importance of doctor visits  Patient Goals/Self-Care Activities: Participate in Transition of Care Program/Attend [facility] scheduled calls Take all medications as prescribed Call pharmacy for medication refills 3-7 days in advance of running out of medications Perform all self care activities independently  Perform IADL's (shopping, preparing meals, housekeeping, managing finances) independently Call provider office for new concerns or questions   Follow Up Plan:  The patient has been provided with contact information for the care management team and has been advised to call with any health related questions or concerns.          If you are experiencing a Mental Health or Behavioral Health Crisis or  need someone to talk to, please call the Suicide and Crisis Lifeline: 988 call the [place] National Suicide Prevention Lifeline: (209)735-5246 or TTY: (343)338-0486 TTY 219 519 8777) to talk to a trained counselor   Patient verbalizes understanding of instructions and care plan provided today and agrees to view in MyChart. Active MyChart status and patient understanding of how to access instructions and care plan via MyChart confirmed with patient.     The patient has been provided with contact information for the care management team and has been advised to call with any health related questions or concerns.  Jodelle Gross RN, BSN, CCM RN Care Manager  Transitions of Care  [facility] - [facility]  [phone]

## 2023-10-03 NOTE — Patient Outreach (Signed)
Care Management  Transitions of Care Program Transitions of Care Post-discharge week 2   10/03/2023 Name: Richard [name] MRN: [id number] DOB: [birth year]  Subjective: Richard [name] is a 62 y.o. year old male who is a primary care patient of [name], DO. The Care Management team Engaged with patient Engaged with patient by telephone to assess and address transitions of care needs.   Consent to Services:  Patient was given information about care management services, agreed to services, and gave verbal consent to participate.   Assessment:  Date of Discharge: 09/23/2023 Discharge Facility: [facility] Type of Discharge: Inpatient Admission Diagnosis: Osteomyelitis  SDOH Interventions    Flowsheet Row Telephone from 10/03/2023 in [place] POPULATION HEALTH DEPARTMENT Clinical Support from 09/25/2023 in [facility] Of [place] Telephone from 09/23/2023 in [place] POPULATION HEALTH DEPARTMENT Telephone from 09/09/2023 in [place] POPULATION HEALTH DEPARTMENT Telephone from 08/27/2022 in [facility] Community Care Coordination Telephone from 07/23/2022 in [facility] Community Care Coordination  SDOH Interventions        Food Insecurity Interventions Intervention Not Indicated Intervention Not Indicated Intervention Not Indicated Intervention Not Indicated Intervention Not Indicated --  Housing Interventions Intervention Not Indicated Intervention Not Indicated Intervention Not Indicated Intervention Not Indicated -- Intervention Not Indicated  Transportation Interventions Intervention Not Indicated Intervention Not Indicated Intervention Not Indicated Intervention Not Indicated -- Intervention Not Indicated  Utilities Interventions Intervention Not Indicated Intervention Not Indicated Intervention Not Indicated Intervention Not Indicated Intervention Not Indicated --  Alcohol Usage Interventions --  Intervention Not Indicated (Score <7) -- -- -- --  Financial Strain Interventions -- Intervention Not Indicated -- -- -- --  Physical Activity Interventions -- Intervention Not Indicated -- -- -- --  Stress Interventions -- Intervention Not Indicated -- -- -- --  Social Connections Interventions -- Intervention Not Indicated -- -- -- --  Health Literacy Interventions -- Intervention Not Indicated -- -- -- --        Goals Addressed             This Visit's Progress    COMPLETED: TOC Care Plan       Current Barriers:  Medication management pain med and ABT Provider appointments Surgical and PCP Chronic Disease Management support and education needs 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   Interventions: Evaluation of current treatment plan related to  self management and patient's adherence to plan as established by provider  Transitions of Care:  Goal on track:  Yes. Doctor Visits  - discussed the importance of doctor visits  Patient Goals/Self-Care Activities: Participate in Transition of Care Program/Attend [facility] scheduled calls Take all medications as prescribed Call pharmacy for medication refills 3-7 days in advance of running out of medications Perform all self care activities independently  Perform IADL's (shopping, preparing meals, housekeeping, managing finances) independently Call provider office for new concerns or questions   Follow Up Plan:  The  patient has been provided with contact  information for the care management team and has been advised to call with any health related questions or concerns.         Plan: Patient to call [facility] Medicine Center with concerns/issues.  Jodelle Gross RN, BSN, CCM RN Care Manager  Transitions of Care  [facility] - [facility]  [phone]

## 2023-10-08 ENCOUNTER — Ambulatory Visit (HOSPITAL_COMMUNITY): Payer: 59 | Attending: Cardiology

## 2023-10-08 DIAGNOSIS — I517 Cardiomegaly: Secondary | ICD-10-CM | POA: Diagnosis not present

## 2023-10-08 DIAGNOSIS — I2510 Atherosclerotic heart disease of native coronary artery without angina pectoris: Secondary | ICD-10-CM | POA: Insufficient documentation

## 2023-10-08 DIAGNOSIS — I70235 Atherosclerosis of native arteries of right leg with ulceration of other part of foot: Secondary | ICD-10-CM | POA: Insufficient documentation

## 2023-10-08 DIAGNOSIS — I10 Essential (primary) hypertension: Secondary | ICD-10-CM | POA: Diagnosis not present

## 2023-10-08 DIAGNOSIS — I739 Peripheral vascular disease, unspecified: Secondary | ICD-10-CM | POA: Insufficient documentation

## 2023-10-08 DIAGNOSIS — I080 Rheumatic disorders of both mitral and aortic valves: Secondary | ICD-10-CM | POA: Diagnosis not present

## 2023-10-08 DIAGNOSIS — I5030 Unspecified diastolic (congestive) heart failure: Secondary | ICD-10-CM | POA: Diagnosis not present

## 2023-10-08 DIAGNOSIS — J449 Chronic obstructive pulmonary disease, unspecified: Secondary | ICD-10-CM | POA: Diagnosis not present

## 2023-10-08 DIAGNOSIS — E785 Hyperlipidemia, unspecified: Secondary | ICD-10-CM | POA: Diagnosis not present

## 2023-10-08 DIAGNOSIS — E1151 Type 2 diabetes mellitus with diabetic peripheral angiopathy without gangrene: Secondary | ICD-10-CM | POA: Insufficient documentation

## 2023-10-08 DIAGNOSIS — R079 Chest pain, unspecified: Secondary | ICD-10-CM | POA: Diagnosis not present

## 2023-10-08 LAB — ECHOCARDIOGRAM COMPLETE
AR max vel: 1.39 cm2
AV Area VTI: 1.17 cm2
AV Area mean vel: 1.26 cm2
AV Mean grad: 21 mm[Hg]
AV Peak grad: 31.1 mm[Hg]
Ao pk vel: 2.79 m/s
Area-P 1/2: 2.62 cm2
MV VTI: 1.71 cm2
S' Lateral: 2.99 cm

## 2023-10-09 ENCOUNTER — Emergency Department (HOSPITAL_COMMUNITY)
Admission: EM | Admit: 2023-10-09 | Discharge: 2023-10-09 | Disposition: A | Discharge status: Home / Self Care | Payer: 59 | Attending: Emergency Medicine | Admitting: Emergency Medicine

## 2023-10-09 ENCOUNTER — Encounter (HOSPITAL_COMMUNITY): Payer: Self-pay | Admitting: Emergency Medicine

## 2023-10-09 ENCOUNTER — Emergency Department (HOSPITAL_COMMUNITY): Payer: 59

## 2023-10-09 ENCOUNTER — Other Ambulatory Visit: Payer: Self-pay | Admitting: Nurse Practitioner

## 2023-10-09 ENCOUNTER — Other Ambulatory Visit: Payer: Self-pay

## 2023-10-09 DIAGNOSIS — I10 Essential (primary) hypertension: Secondary | ICD-10-CM | POA: Insufficient documentation

## 2023-10-09 DIAGNOSIS — I2510 Atherosclerotic heart disease of native coronary artery without angina pectoris: Secondary | ICD-10-CM | POA: Diagnosis not present

## 2023-10-09 DIAGNOSIS — Z89422 Acquired absence of other left toe(s): Secondary | ICD-10-CM | POA: Diagnosis not present

## 2023-10-09 DIAGNOSIS — Z794 Long term (current) use of insulin: Secondary | ICD-10-CM | POA: Diagnosis not present

## 2023-10-09 DIAGNOSIS — E119 Type 2 diabetes mellitus without complications: Secondary | ICD-10-CM | POA: Insufficient documentation

## 2023-10-09 DIAGNOSIS — M7732 Calcaneal spur, left foot: Secondary | ICD-10-CM | POA: Diagnosis not present

## 2023-10-09 DIAGNOSIS — I059 Rheumatic mitral valve disease, unspecified: Secondary | ICD-10-CM

## 2023-10-09 DIAGNOSIS — M7989 Other specified soft tissue disorders: Secondary | ICD-10-CM | POA: Diagnosis not present

## 2023-10-09 DIAGNOSIS — G8918 Other acute postprocedural pain: Secondary | ICD-10-CM | POA: Insufficient documentation

## 2023-10-09 DIAGNOSIS — Z7984 Long term (current) use of oral hypoglycemic drugs: Secondary | ICD-10-CM | POA: Diagnosis not present

## 2023-10-09 DIAGNOSIS — I350 Nonrheumatic aortic (valve) stenosis: Secondary | ICD-10-CM

## 2023-10-09 DIAGNOSIS — Z7982 Long term (current) use of aspirin: Secondary | ICD-10-CM | POA: Diagnosis not present

## 2023-10-09 DIAGNOSIS — M79672 Pain in left foot: Secondary | ICD-10-CM | POA: Insufficient documentation

## 2023-10-09 LAB — CBC WITH DIFFERENTIAL/PLATELET
Abs Immature Granulocytes: 0.05 10*3/uL (ref 0.00–0.07)
Basophils Absolute: 0.1 10*3/uL (ref 0.0–0.1)
Basophils Relative: 2 %
Eosinophils Absolute: 0.1 10*3/uL (ref 0.0–0.5)
Eosinophils Relative: 3 %
HCT: 38.9 % — ABNORMAL LOW (ref 39.0–52.0)
Hemoglobin: 12.1 g/dL — ABNORMAL LOW (ref 13.0–17.0)
Immature Granulocytes: 1 %
Lymphocytes Relative: 21 %
Lymphs Abs: 0.9 10*3/uL (ref 0.7–4.0)
MCH: 25.5 pg — ABNORMAL LOW (ref 26.0–34.0)
MCHC: 31.1 g/dL (ref 30.0–36.0)
MCV: 82.1 fL (ref 80.0–100.0)
Monocytes Absolute: 0.6 10*3/uL (ref 0.1–1.0)
Monocytes Relative: 14 %
Neutro Abs: 2.4 10*3/uL (ref 1.7–7.7)
Neutrophils Relative %: 59 %
Platelets: 304 10*3/uL (ref 150–400)
RBC: 4.74 MIL/uL (ref 4.22–5.81)
RDW: 14.6 % (ref 11.5–15.5)
WBC: 4.1 10*3/uL (ref 4.0–10.5)
nRBC: 0 % (ref 0.0–0.2)

## 2023-10-09 LAB — COMPREHENSIVE METABOLIC PANEL
ALT: 17 U/L (ref 0–44)
AST: 16 U/L (ref 15–41)
Albumin: 3.1 g/dL — ABNORMAL LOW (ref 3.5–5.0)
Alkaline Phosphatase: 93 U/L (ref 38–126)
Anion gap: 9 (ref 5–15)
BUN: 27 mg/dL — ABNORMAL HIGH (ref 8–23)
CO2: 22 mmol/L (ref 22–32)
Calcium: 9 mg/dL (ref 8.9–10.3)
Chloride: 103 mmol/L (ref 98–111)
Creatinine, Ser: 1.33 mg/dL — ABNORMAL HIGH (ref 0.61–1.24)
GFR, Estimated: 60 mL/min — ABNORMAL LOW (ref >60)
Glucose, Bld: 95 mg/dL (ref 70–99)
Potassium: 4.4 mmol/L (ref 3.5–5.1)
Sodium: 134 mmol/L — ABNORMAL LOW (ref 135–145)
Total Bilirubin: 0.7 mg/dL (ref 0.0–1.2)
Total Protein: 6.7 g/dL (ref 6.5–8.1)

## 2023-10-09 MED ORDER — OXYCODONE-ACETAMINOPHEN 5-325 MG PO TABS
1.0000 | ORAL_TABLET | ORAL | Status: AC | PRN
Start: 2023-10-09 — End: 2023-10-09
  Administered 2023-10-09 (×2): 1 via ORAL
  Filled 2023-10-09 (×2): qty 1

## 2023-10-09 MED ORDER — OXYCODONE-ACETAMINOPHEN 5-325 MG PO TABS: 1.0000 | ORAL_TABLET | Freq: Four times a day (QID) | ORAL | 0 refills | Status: DC | PRN

## 2023-10-09 NOTE — ED Triage Notes (Signed)
 Pt s/p toe amputation by Dr. Annamary Rummage 3 weeks ago with severe pain to same today. Reports bloody drainage from incision site.

## 2023-10-09 NOTE — ED Notes (Signed)
 Pt complaining of pain in foot increasing. Triage RN and Charge RN notified.

## 2023-10-09 NOTE — ED Provider Notes (Addendum)
 [facility] Provider Note   CSN: [id number] Arrival date & time: 10/09/23  1140     History Chief Complaint  Patient presents with   Post-op Problem    Richard [name] is a 63 y.o. male history of type 2 diabetes, hypertension, CAD, peripheral artery disease presents emergency department today for evaluation of left foot pain.  Patient reports he has had a small amount of increased bleeding at the surgical site.  Reports he had surgery on 09-17-2023 by Dr. Malvin.  He has seen him again on 10-03-2023.  Patient reports that he had some of the sutures removed however did not remove all of them because it started to bleed some.  That was when his last wound dressing change was as well.  He reports he has been having increase in pain since then.  Has tried his at home Lyrica  without much relief.  He called the office today inform the minute that he was coming in.  HPI     Home Medications Prior to Admission medications   Medication Sig Start Date End Date Taking? Authorizing Provider  albuterol  (PROVENTIL ) (2.5 MG/3ML) 0.083% nebulizer solution Take 3 mLs (2.5 mg total) by nebulization 2 (two) times daily. 04/18/23   Rosan Dayton [name], DO  albuterol  (VENTOLIN  HFA) 108 (90 Base) MCG/ACT inhaler Inhale 1-2 puffs into the lungs every 4 (four) hours as needed for wheezing or shortness of breath. 09/30/23   Rosan Dayton [name], DO  aspirin  EC 81 MG EC tablet Take 1 tablet (81 mg total) by mouth daily. 09/26/15   Burns, Johana SAUNDERS, MD  atorvastatin  (LIPITOR) 40 MG tablet Take 1 tablet (40 mg total) by mouth daily. 09/17/23   Rosan Dayton [name], DO  Budeson-Glycopyrrol-Formoterol  (BREZTRI  AEROSPHERE) 160-9-4.8 MCG/ACT AERO Inhale 2 puffs into the lungs in the morning and at bedtime. 12/05/22   Gladis Leonor HERO, MD  clopidogrel  (PLAVIX ) 75 MG tablet Take 1 tablet (75 mg total) by mouth daily. 09/26/23   Rosan Dayton [name], DO  Continuous Glucose Sensor (FREESTYLE  LIBRE 3 SENSOR) MISC Place 1 sensor on the skin every 14 days. Use to check glucose continuously 06/20/23   Rosan Dayton [name], DO  Dulaglutide  (TRULICITY ) 4.5 MG/0.5ML SOPN Inject 4.5 mg as directed once a week. 06/18/23   Tobie Gaines, DO  Empagliflozin -metFORMIN  HCl (SYNJARDY ) 12.02-999 MG TABS Take 1 tablet by mouth in the morning and at bedtime. 08/15/23 02/11/24  Francella Rogue, MD  ferrous sulfate  325 (65 FE) MG tablet Take 1 tablet (325 mg total) by mouth daily. 06/19/23 06/18/24  Tobie Gaines, DO  fluticasone  (FLONASE ) 50 MCG/ACT nasal spray Place 1 spray into both nostrils daily. 03/22/23   Rosan Dayton [name], DO  insulin  degludec (TRESIBA ) 100 UNIT/ML FlexTouch Pen Inject 20 Units into the skin daily. 06/20/23   Rosan Dayton [name], DO  Insulin  Pen Needle (PEN NEEDLES) 32G X 4 MM MISC 1 each by Does not apply route daily. 06/20/23   Rosan Dayton [name], DO  meloxicam  (MOBIC ) 15 MG tablet Take 1 tablet (15 mg total) by mouth daily as needed for pain. 07/15/23   Lovie Clarity, MD  Mepolizumab  (NUCALA ) 100 MG/ML SOAJ Inject 1 mL (100 mg total) into the skin every 28 (twenty-eight) days. 08/15/23   Francella Rogue, MD  montelukast  (SINGULAIR ) 10 MG tablet Take 1 tablet (10 mg total) by mouth at bedtime. 08/15/23   Francella Rogue, MD  mupirocin  ointment (BACTROBAN ) 2 % Place 1 Application into the nose  2 (two) times daily. 09/21/23   Tawkaliyar, Roya, DO  oxyCODONE  (OXY IR/ROXICODONE ) 5 MG immediate release tablet Take 5 mg by mouth 4 (four) times daily as needed. 09/22/23   [provider]  polyethylene glycol powder (GLYCOLAX /MIRALAX ) 17 GM/SCOOP powder Take 17 g by mouth daily. 09/21/23   Tawkaliyar, Roya, DO  pregabalin  (LYRICA ) 100 MG capsule Take 1 capsule (100 mg total) by mouth 3 (three) times daily. 09/26/23   Rosan Dayton [name], DO      Allergies    Shellfish allergy    Review of Systems   Review of Systems  Constitutional:  Negative for chills and fever.  Respiratory:  Negative for shortness of breath.    Cardiovascular:  Negative for chest pain.  Gastrointestinal:  Negative for abdominal pain, diarrhea and vomiting.  Musculoskeletal:  Positive for arthralgias.    Physical Exam Updated Vital Signs BP (!) 150/71   Pulse 87   Temp 98 F (36.7 C) (Oral)   Resp 19   Ht 6' 3 (1.905 m)   Wt 102.9 kg   SpO2 100%   BMI 28.36 kg/m  Physical Exam Vitals and nursing note reviewed.  Constitutional:      General: He is not in acute distress.    Appearance: He is not ill-appearing or toxic-appearing.     Comments: On phone in no acute distress  Eyes:     General: No scleral icterus. Cardiovascular:     Rate and Rhythm: Normal rate.  Pulmonary:     Effort: Pulmonary effort is normal. No respiratory distress.  Musculoskeletal:     Right lower leg: No edema.     Left lower leg: No edema.     Comments: Sutures and staples intact.  There is some crusted blood but no active oozing or bleeding.  No discharge.  There is no increase in warmth or erythema to the area.  Surprisingly, he has palpable pulses.  Compartments are soft.  Does have some dry flaky skin.  Please see images.  Neurological:     Mental Status: He is alert.            ED Results / Procedures / Treatments   Labs (all labs ordered are listed, but only abnormal results are displayed) Labs Reviewed  CBC WITH DIFFERENTIAL/PLATELET - Abnormal; Notable for the following components:      Result Value   Hemoglobin 12.1 (*)    HCT 38.9 (*)    MCH 25.5 (*)    All other components within normal limits  COMPREHENSIVE METABOLIC PANEL - Abnormal; Notable for the following components:   Sodium 134 (*)    BUN 27 (*)    Creatinine, Ser 1.33 (*)    Albumin 3.1 (*)    GFR, Estimated 60 (*)    All other components within normal limits    EKG None  Radiology DG Foot Complete Left Result Date: 10/09/2023 CLINICAL DATA:  post op pain status post toe amputation 3 weeks prior, incisions site bloody drainage EXAM: LEFT FOOT -  COMPLETE 3+ VIEW COMPARISON:  09/18/2023 left foot radiographs FINDINGS: Status post distal left foot amputation at the level of the distal metatarsals. Skin staples noted at the medial distal left foot soft tissues. Diffuse soft tissue swelling at the amputation site. Scattered antibiotic beads at the medial amputation site soft tissues. No acute osseous fracture or dislocation. Lisfranc joint appears intact. No focal osseous lesions. No acute osseous erosions or periosteal reactions. No definite soft tissue gas. Vascular  calcifications throughout the soft tissues. Tiny Achilles left calcaneal spur. IMPRESSION: Status post distal left foot amputation at the level of the distal metatarsals. Diffuse soft tissue swelling at the amputation site. Scattered antibiotic beads at the medial amputation site soft tissues. No definite soft tissue gas. No radiographic evidence of acute osteomyelitis. Electronically Signed   By: Selinda DELENA Blue M.D.   On: 10/09/2023 17:02   ECHOCARDIOGRAM COMPLETE Result Date: 10/08/2023    ECHOCARDIOGRAM REPORT   Patient Name:   Richard [name] Date of Exam: 10/08/2023 Medical Rec #:  [id number]       Height:       75.0 in Accession #:    [id number]      Weight:       226.9 lb Date of Birth:  27-Dec-1960        BSA:          2.315 m Patient Age:    62 years        BP:           138/70 mmHg Patient Gender: M               HR:           83 bpm. Exam Location:  [address] Procedure: 2D Echo, Cardiac Doppler, Color Doppler and 3D Echo Indications:    Aortic stenosis I35.0  History:        Patient has prior history of Echocardiogram examinations, most                 recent 10/06/2021. CAD, COPD, Aortic Valve Disease,                 Signs/Symptoms:Chest Pain and Dyspnea; Risk Factors:Diabetes,                 Hypertension and Dyslipidemia.  Sonographer:    Lauraine Pilot RDCS Referring Phys: [id number] ROSALINE HERO SWINYER IMPRESSIONS  1. Left ventricular ejection fraction, by estimation, is 50 to 55%.  The left ventricle has low normal function. The left ventricle has no regional wall motion abnormalities. There is mild concentric left ventricular hypertrophy. Left ventricular diastolic parameters are consistent with Grade I diastolic dysfunction (impaired relaxation).  2. Right ventricular systolic function is normal. The right ventricular size is normal. There is normal pulmonary artery systolic pressure. The estimated right ventricular systolic pressure is 21.7 mmHg.  3. Left atrial size was mildly dilated.  4. 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 Trivial mitral valve regurgitation. Moderate mitral stenosis. The mean mitral valve gradient is 5.0 mmHg. MVA 1.7 cm^2.  5. The aortic valve is tricuspid. There is moderate calcification of the aortic valve. Aortic valve regurgitation is not visualized. Moderate aortic valve stenosis. Aortic valve area, by VTI measures 1.17 cm. Aortic valve mean gradient measures 21.0 mmHg.  6. The inferior vena cava is normal in size with greater than 50% respiratory variability, suggesting right atrial pressure of 3 mmHg. FINDINGS  Left Ventricle: Left ventricular ejection fraction, by estimation, is 50 to 55%. The left ventricle has low normal function. The left ventricle has no regional wall motion abnormalities. The left ventricular internal cavity size was normal in size. There is mild concentric left ventricular hypertrophy. Left ventricular diastolic parameters are consistent with Grade I diastolic dysfunction (impaired relaxation). Right Ventricle: The right ventricular size is normal. No increase in right ventricular wall thickness. Right ventricular systolic function is normal. There is normal pulmonary artery systolic pressure. The tricuspid regurgitant velocity is 2.16 m/s, and  with an assumed right atrial pressure of 3 mmHg, the estimated right ventricular systolic pressure is 21.7 mmHg. Left Atrium: Left atrial size was mildly dilated. Right Atrium: Right atrial size was  normal in size. Pericardium: There is no evidence of pericardial effusion. Mitral Valve: The mitral valve is abnormal. There is moderate calcification of the mitral valve leaflet(s). Mild mitral annular calcification. Trivial mitral valve regurgitation. Moderate mitral valve stenosis. MV peak gradient, 12.1 mmHg. The mean mitral valve gradient is 5.0 mmHg. Tricuspid Valve: The tricuspid valve is normal in structure. Tricuspid valve regurgitation is trivial. Aortic Valve: The aortic valve is tricuspid. There is moderate calcification of the aortic valve. Aortic valve regurgitation is not visualized. Moderate aortic stenosis is present. Aortic valve mean gradient measures 21.0 mmHg. Aortic valve peak gradient  measures 31.1 mmHg. Aortic valve area, by VTI measures 1.17 cm. Pulmonic Valve: The pulmonic valve was normal in structure. Pulmonic valve regurgitation is trivial. Aorta: The aortic root is normal in size and structure. Venous: The inferior vena cava is normal in size with greater than 50% respiratory variability, suggesting right atrial pressure of 3 mmHg. IAS/Shunts: No atrial level shunt detected by color flow Doppler.  LEFT VENTRICLE PLAX 2D LVIDd:         4.08 cm   Diastology LVIDs:         2.99 cm   LV e' medial:    6.98 cm/s LV PW:         1.29 cm   LV E/e' medial:  18.9 LV IVS:        1.26 cm   LV e' lateral:   8.33 cm/s LVOT diam:     2.40 cm   LV E/e' lateral: 15.8 LV SV:         74 LV SV Index:   32 LVOT Area:     4.52 cm                           3D Volume EF:                          3D EF:        50 %                          LV EDV:       193 ml                          LV ESV:       97 ml                          LV SV:        96 ml RIGHT VENTRICLE RV S prime:     12.40 cm/s TAPSE (M-mode): 2.3 cm LEFT ATRIUM             Index        RIGHT ATRIUM           Index LA diam:        4.00 cm 1.73 cm/m   RA Area:     13.40 cm LA Vol (A2C):   70.3 ml 30.36 ml/m  RA Volume:   32.20 ml  13.91 ml/m  LA Vol (A4C):   53.3 ml 23.02 ml/m LA Biplane Vol: 65.3 ml  28.20 ml/m  AORTIC VALVE AV Area (Vmax):    1.39 cm AV Area (Vmean):   1.26 cm AV Area (VTI):     1.17 cm AV Vmax:           278.75 cm/s AV Vmean:          203.750 cm/s AV VTI:            0.632 m AV Peak Grad:      31.1 mmHg AV Mean Grad:      21.0 mmHg LVOT Vmax:         85.50 cm/s LVOT Vmean:        56.700 cm/s LVOT VTI:          0.163 m LVOT/AV VTI ratio: 0.26  AORTA Ao Root diam: 3.60 cm Ao Asc diam:  3.40 cm MITRAL VALVE                TRICUSPID VALVE MV Area (PHT): 2.62 cm     TR Peak grad:   18.7 mmHg MV Area VTI:   1.71 cm     TR Vmax:        216.00 cm/s MV Peak grad:  12.1 mmHg MV Mean grad:  5.0 mmHg     SHUNTS MV Vmax:       1.74 m/s     Systemic VTI:  0.16 m MV Vmean:      106.0 cm/s   Systemic Diam: 2.40 cm MV E velocity: 132.00 cm/s MV A velocity: 156.00 cm/s MV E/A ratio:  0.85 Dalton McleanMD Electronically signed by Ezra Kanner Signature Date/Time: 10/08/2023/6:13:34 PM    Final     Procedures Procedures   Medications Ordered in ED Medications  oxyCODONE -acetaminophen  (PERCOCET/ROXICET) 5-325 MG per tablet 1 tablet (1 tablet Oral Given 10/09/23 1519)    ED Course/ Medical Decision Making/ A&P                               Medical Decision Making Amount and/or Complexity of Data Reviewed Labs: ordered. Radiology: ordered.  Risk Prescription drug management.   63 y.o. male presents to the ER for evaluation of foot pain. Differential diagnosis includes but is not limited to postop pain, postop complication, infection. Vital signs mildly elevated blood pressure otherwise unremarkable. Physical exam as noted above.   On previous chart evaluation, patient had TMA on 09-18-2023 from osteomyelitis from a type II diabetic wound.  He was recently seen on 10-04-2023 at Dr. Emmitt office for the postop check.  The note reports that he had removed some of the staples however did have some mild wound dehiscence so  left the remaining sutures and staples in.  The patient reports that he has had a small amount of increase in bleeding after he looked under the Ace bandage on his foot.  Reports he is also been having increase in pain.  Will order basic labs and x-ray.  I independently reviewed and interpreted the patient's labs.  CBC shows hemoglobin of 12.1 hematocrit 38.9.  No leukocytosis.  Anemia appears to be in the baseline.  CMP shows sodium 134.  BUN of 27 and creatinine 1.33.  Appears baseline is around 1.1.  Decrease in albumin.  No other electrolyte or LFT abnormality.  Patient does have slight increase in his creatinine however I do feel he could manage this with increasing outpatient p.o. intake.  He is not having any nausea or vomiting.  X-ray shows Status post distal left foot amputation at the level of the distal metatarsals. Diffuse soft tissue swelling at the amputation site. Scattered antibiotic beads at the medial amputation site soft tissues. No definite soft tissue gas. No radiographic evidence of acute osteomyelitis..  I consulted podiatry and spoke with Dr. Silva.  He thinks that the wound is well-appearing.  I discussed with him the vital signs, lab results, and x-ray imaging.  He viewed the images as well.  He reports this patient can be discharged home and the office will reach out to him for sooner appointment.  Patient is already maxed out on Lyrica  dosing.  Can give him another short course of narcotic pain medications to help him through with this.  Will rewrap the foot.  He does not have any active discharge or signs of infection.  Is not actively bleeding currently.  There is minimal blood on the gauze.  Will have the patient follow-up with outpatient podiatry.  We discussed the results of the labs/imaging. The plan is follow-up podiatry, take the medication as needed. We discussed strict return precautions and red flag symptoms. The patient verbalized their understanding and agrees to  the plan. The patient is stable and being discharged home in good condition.  Portions of this report may have been transcribed using voice recognition software. Every effort was made to ensure accuracy; however, inadvertent computerized transcription errors may be present.   Final Clinical Impression(s) / ED Diagnoses Final diagnoses:  Post-op pain    Rx / DC Orders ED Discharge Orders          Ordered    oxyCODONE -acetaminophen  (PERCOCET/ROXICET) 5-325 MG tablet  Every 6 hours PRN        10/09/23 1810              Bernis Ernst, PA-C 10/09/23 1811    Bernis Ernst, PA-C 10/09/23 1811    Laurice Maude [name], MD 10/10/23 [phone]

## 2023-10-09 NOTE — ED Notes (Signed)
 Patient verbalizes understanding of discharge instructions. Opportunity for questioning and answers were provided. Armband removed by staff, pt discharged from ED. Pt taken to ED entrance via wheel chair.

## 2023-10-09 NOTE — Discharge Instructions (Addendum)
 You were seen in the emerged part today for evaluation of your postop pain.  I will prescribe you another short course of narcotics.  Dr. Emmitt office should be reaching out to you for follow-up. Make sure you follow up and follow directions that were given by his office previously. If you have any concerns, new or worsening symptoms, please return to the nearest ER for evaluation.   Contact a health care provider if: You have signs of infection at your incision, such as: Redness, swelling, or pain. Fluid or blood. Warmth. Pus or a bad smell. You have a fever or chills. Your dressing is soaked with blood. Your sutures tear or they separate. Your pain does not improve after you take your medicine. Get help right away if: You have red streaks on your skin near your toes, foot, or leg. You have pain in your calf or behind your knee. You feel extreme pain, numbness, weakness, loss of a pulse, or your foot feels cold to touch. This can be a sign of a medical emergency called compartment syndrome. You have chest pain or shortness of breath. These symptoms may be an emergency. Get help right away. Call 911. Do not wait to see if the symptoms will go away. Do not drive yourself to the hospital.

## 2023-10-10 ENCOUNTER — Encounter: Payer: 59 | Admitting: Podiatry

## 2023-10-10 ENCOUNTER — Other Ambulatory Visit (HOSPITAL_COMMUNITY): Payer: Self-pay

## 2023-10-10 NOTE — Progress Notes (Signed)
 Specialty Pharmacy Refill Coordination Note  Richard [name] is a 63 y.o. male contacted today regarding refills of specialty medication(s) Mepolizumab  (Nucala )   Patient requested Delivery   Delivery date: 10/15/23   Verified address: [address]  [place] [address]   Medication will be filled on 10/14/23.

## 2023-10-14 ENCOUNTER — Telehealth: Payer: Self-pay | Admitting: Dietician

## 2023-10-14 ENCOUNTER — Other Ambulatory Visit: Payer: Self-pay

## 2023-10-14 MED ORDER — MELOXICAM 15 MG PO TABS: 15.0000 mg | ORAL_TABLET | Freq: Every day | ORAL | 2 refills | Status: DC | PRN

## 2023-10-14 NOTE — Telephone Encounter (Signed)
 Libre 3 misplaced. Has refills, advised him to call his pharmacy.

## 2023-10-17 ENCOUNTER — Encounter: Payer: Self-pay | Admitting: Podiatry

## 2023-10-17 ENCOUNTER — Ambulatory Visit (INDEPENDENT_AMBULATORY_CARE_PROVIDER_SITE_OTHER): Payer: 59 | Admitting: Podiatry

## 2023-10-17 DIAGNOSIS — Z89432 Acquired absence of left foot: Secondary | ICD-10-CM

## 2023-10-17 NOTE — Progress Notes (Signed)
  Subjective:  Patient ID: Richard [name], male    DOB: 1961/04/01,  MRN: [id number]   DOS: 09/18/23 Procedure: Left foot Trans metatarsal amputation, tendo achilles lengthening  63 y.o. male seen for post op check.  He is doing very well at this time.  Denies pain.  Walking in cam boot.  Review of Systems: Negative except as noted in the HPI. Denies N/V/F/Ch.   Objective:  There were no vitals filed for this visit. There is no height or weight on file to calculate BMI. Constitutional Well developed. Well nourished.  Vascular Foot warm and well perfused. Capillary refill normal to all digits.   No calf pain with palpation  Neurologic Normal speech. Oriented to person, place, and time. Epicritic sensation Absent to forefoot.  Dermatologic Amputation site healing well no dehisence. No drainage or erythema.  Improved overall from prior   Orthopedic: S/p Transmet amp to the left foot.   Radiographs: Deferred today. Hosptial Xr demonstrated: Status post metatarsal amputation without acute abnormality.   Pathology: Acute and chronic osteomyelitis  Micro: E Cloacae.   Assessment:  S/p L foot TMA, TAL Plan:  Patient was evaluated and treated and all questions answered.  1 mo post ops/p L foot TMA, TAL -Progressing very well, surgical site healing   -XR: Deferred at this visit -WB Status:  weightbearing to the left foot in cam boot -Sutures: Removed in total today   -Medications/ABX: Finished course of abx, no indication for more at this time -Foot redressed with Steri-Strips.  He is okay to now wash the foot in warm soapy water  dry thoroughly after and reapply a sock or protection over the incision site. -Follow-up in 3 weeks        Marolyn JULIANNA Honour, DPM [facility] / [facility]                   10/17/2023          Marolyn JULIANNA Honour, DPM [facility] / [facility]

## 2023-10-18 ENCOUNTER — Ambulatory Visit (INDEPENDENT_AMBULATORY_CARE_PROVIDER_SITE_OTHER): Payer: 59 | Admitting: Podiatry

## 2023-10-18 ENCOUNTER — Ambulatory Visit: Payer: 59 | Admitting: Primary Care

## 2023-10-18 ENCOUNTER — Ambulatory Visit: Payer: 59 | Attending: Cardiovascular Disease | Admitting: Cardiovascular Disease

## 2023-10-18 ENCOUNTER — Telehealth: Payer: Self-pay | Admitting: Podiatry

## 2023-10-18 ENCOUNTER — Encounter: Payer: Self-pay | Admitting: Podiatry

## 2023-10-18 ENCOUNTER — Encounter: Payer: Self-pay | Admitting: Cardiovascular Disease

## 2023-10-18 VITALS — BP 100/66 | HR 95 | Ht 75.0 in | Wt 231.6 lb

## 2023-10-18 DIAGNOSIS — I350 Nonrheumatic aortic (valve) stenosis: Secondary | ICD-10-CM

## 2023-10-18 DIAGNOSIS — I050 Rheumatic mitral stenosis: Secondary | ICD-10-CM

## 2023-10-18 DIAGNOSIS — M7989 Other specified soft tissue disorders: Secondary | ICD-10-CM

## 2023-10-18 DIAGNOSIS — T8781 Dehiscence of amputation stump: Secondary | ICD-10-CM

## 2023-10-18 DIAGNOSIS — Z89432 Acquired absence of left foot: Secondary | ICD-10-CM

## 2023-10-18 MED ORDER — SULFAMETHOXAZOLE-TRIMETHOPRIM 800-160 MG PO TABS: 1.0000 | ORAL_TABLET | Freq: Two times a day (BID) | ORAL | 0 refills | Status: AC

## 2023-10-18 NOTE — Progress Notes (Signed)
  Subjective:  Patient ID: Richard [name], male    DOB: 1961-07-25,  MRN: [id number]   DOS: 09/18/23 Procedure: Left foot Trans metatarsal amputation, tendo achilles lengthening  63 y.o. male comes in with concern for surgical site bleeding. He is concerned that the TMA site has re-opened. Endorses pain, swelling, no redness, no drainage. He comes in ambulating in surgical shoe.  Review of Systems: Negative except as noted in the HPI. Denies N/V/F/Ch.   Objective:  There were no vitals filed for this visit. There is no height or weight on file to calculate BMI. Constitutional Well developed. Well nourished.  Vascular Foot warm and well perfused. Capillary refill is intact to the TMA site No calf pain with palpation +1 to +2 pitting edema present  Neurologic Normal speech. Oriented to person, place, and time. Epicritic sensation Absent to forefoot.  Dermatologic There is some dehiscence at the surgical site.  Medial aspect of the TMA has reopened.  0.6 x 4 x 0.3 cm.  Fat layer exposed.  Deep red granulation tissue appreciated.  No active bleeding.  No ascending erythema.   Orthopedic: S/p Transmet amp to the left foot.     Radiographs: Deferred today. Hosptial Xr demonstrated: Status post metatarsal amputation without acute abnormality.   Pathology: Acute and chronic osteomyelitis  Micro: E Cloacae.   Assessment:  S/p L foot TMA, TAL, Surgical site dehiscence with fat layer exposed, left lower extremity swelling. Plan:  Patient was evaluated and treated and all questions answered.  1 mo post ops/p L foot TMA, TAL -Progressing very well, surgical site healing   -XR: Deferred at this visit -WB Status: limited weight bearing, must be in CAM boot. -Sutures have been removed yesterday -Medications/ABX: Patient very concerned about risk of infection, assurances given to patient that wound bed does appear healthy. Bactrim  prescribed out of abundance of caution based on surgical  cultures. -Wound cleansed with wound cleanser.  Aquacel Ag applied to wound bed today.  3 layer compression wrap applied to the left lower extremity.  Leave dressing clean dry and intact until follow-up with Dr. Malvin early next week. -Dressing applied for edema control as this could contribute to the dehiscence, will likely require complex wound care management and debridements going forward.        Ethan LITTIE Saddler, DPM [facility] / [facility]

## 2023-10-18 NOTE — Telephone Encounter (Signed)
 Pt called and had surgery and his staples removed yesterday and it is bleeding and he thinks it may have opened up a little. He thinks the scab came off. I told him to come on in.

## 2023-10-18 NOTE — Patient Instructions (Signed)
 Testing/Procedures: ECHO (prior to next year's visit) Your physician has requested that you have an echocardiogram. Echocardiography is a painless test that uses sound waves to create images of your heart. It provides your doctor with information about the size and shape of your heart and how well your heart's chambers and valves are working. This procedure takes approximately one hour. There are no restrictions for this procedure. Please do NOT wear cologne, perfume, aftershave, or lotions (deodorant is allowed). Please arrive 15 minutes prior to your appointment time.  Please note: We ask at that you not bring children with you during ultrasound (echo/ vascular) testing. Due to room size and safety concerns, children are not allowed in the ultrasound rooms during exams. Our front office staff cannot provide observation of children in our lobby area while testing is being conducted. An adult accompanying a patient to their appointment will only be allowed in the ultrasound room at the discretion of the ultrasound technician under special circumstances. We apologize for any inconvenience.  Follow-Up: At [facility], you and your health needs are our priority.  As part of our continuing mission to provide you with exceptional heart care, we have created designated Provider Care Teams.  These Care Teams include your primary Cardiologist (physician) and Advanced Practice Providers (APPs -  Physician Assistants and Nurse Practitioners) who all work together to provide you with the care you need, when you need it.  Your next appointment:   6 month(s)  Provider:   Orren Fabry, PA-C, Dayna Dunn, PA-C, Jackee Alberts, NP, Rosaline Bane, NP, Glendia Ferrier, PA-C, or Artist Pouch, PA-C     Then, Ozell Fell, MD will plan to see you again in 1 year(s).

## 2023-10-18 NOTE — Progress Notes (Signed)
 Cardiology Office Note:    Date:  10/18/2023   ID:  Richard [name], DOB [birth year], MRN [id number]  PCP:  Richard Dayton [name], DO   [facility] Providers Cardiologist:  Richard Fell, MD     Referring MD: Richard Dayton [name], DO   Chief Complaint  Patient presents with   Aortic Stenosis    History of Present Illness:    Richard [name] is a 63 y.o. male presenting for evaluation of aortic stenosis.  The patient is here with his son today.  He has been followed by Dr. Dann.  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  He now has moderate aortic stenosis and moderate mitral stenosis.  The patient reports that he is doing fairly well.  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  He is getting around pretty good and ambulating with a cane.  The patient has occasional episodes of chest discomfort that he describes as a central chest pressure along with shortness of breath.  He has had difficulty knowing if this is heart related or related to his asthma.  He typically uses an inhaler with improvement in symptoms.  He just recently quit smoking.  He denies orthopnea or PND.  He has had no lower extremity edema or abdominal swelling.  He denies exertional chest pain or pressure.  He states his symptoms are worse in the cold weather.  He does not appreciate any recent change in symptoms and they have been present for at least a few years.  He had a nuclear stress test in 2022 that showed no  ischemia.  He has had a heart catheterization in the past that showed no obstructive CAD in 2017.  He has had a heart murmur just noticed over the past few years.  He denies any personal history of rheumatic fever.  He denies any family history of valvular heart disease.   Current Medications: Current Meds  Medication Sig   albuterol  (PROVENTIL ) (2.5 MG/3ML) 0.083% nebulizer solution Take 3 mLs (2.5 mg total) by nebulization 2 (two) times daily.   albuterol  (VENTOLIN  HFA) 108 (90 Base) MCG/ACT inhaler Inhale 1-2 puffs into the lungs every 4 (four) hours as needed for wheezing or shortness of breath.   aspirin  EC 81 MG EC tablet Take 1 tablet (81 mg total) by mouth daily.   atorvastatin  (LIPITOR) 40 MG tablet Take 1 tablet (40 mg total) by mouth daily.   Budeson-Glycopyrrol-Formoterol  (BREZTRI  AEROSPHERE) 160-9-4.8 MCG/ACT AERO Inhale 2 puffs into the lungs in the morning and at bedtime.   clopidogrel  (PLAVIX ) 75 MG tablet Take 1 tablet (75 mg total) by mouth daily.   Continuous Glucose Sensor (FREESTYLE LIBRE 3 SENSOR) MISC Place 1 sensor on the skin every 14 days. Use to check glucose continuously   Dulaglutide  (TRULICITY ) 4.5 MG/0.5ML SOPN Inject 4.5 mg as directed once a week.   Empagliflozin -metFORMIN  HCl (SYNJARDY ) 12.02-999 MG TABS Take 1 tablet by mouth in the morning and at bedtime.   ferrous sulfate  325 (65 FE) MG tablet Take 1 tablet (325 mg total) by mouth daily.  fluticasone  (FLONASE ) 50 MCG/ACT nasal spray Place 1 spray into both nostrils daily.   insulin  degludec (TRESIBA ) 100 UNIT/ML FlexTouch Pen Inject 20 Units into the skin daily.   Insulin  Pen Needle (PEN NEEDLES) 32G X 4 MM MISC 1 each by Does not apply route daily.   meloxicam  (MOBIC ) 15 MG tablet Take 1 tablet (15 mg total) by mouth daily as needed for pain.   Mepolizumab  (NUCALA ) 100 MG/ML SOAJ Inject 1 mL (100 mg total) into the skin every 28 (twenty-eight) days.   montelukast  (SINGULAIR ) 10 MG tablet Take 1 tablet (10  mg total) by mouth at bedtime.   mupirocin  ointment (BACTROBAN ) 2 % Place 1 Application into the nose 2 (two) times daily.   oxyCODONE -acetaminophen  (PERCOCET/ROXICET) 5-325 MG tablet Take 1 tablet by mouth every 6 (six) hours as needed for severe pain (pain score 7-10).   polyethylene glycol powder (GLYCOLAX /MIRALAX ) 17 GM/SCOOP powder Take 17 g by mouth daily.   pregabalin  (LYRICA ) 100 MG capsule Take 1 capsule (100 mg total) by mouth 3 (three) times daily.     Allergies:   Shellfish allergy   ROS:   Please see the history of present illness.    All other systems reviewed and are negative.  EKGs/Labs/Other Studies Reviewed:    The following studies were reviewed today: Cardiac Studies & Procedures     STRESS TESTS  MYOCARDIAL PERFUSION IMAGING 10/06/2021  Narrative   The study is normal. The study is low risk.   No ST deviation was noted.   Left ventricular function is normal. Nuclear stress EF: 56 %. The left ventricular ejection fraction is normal (55-65%). End diastolic cavity size is normal.   Prior study not available for comparison.  ECHOCARDIOGRAM  ECHOCARDIOGRAM COMPLETE 10/08/2023  Narrative ECHOCARDIOGRAM REPORT    Patient Name:   Richard [name] Date of Exam: 10/08/2023 Medical Rec #:  [id number]       Height:       75.0 in Accession #:    [id number]      Weight:       226.9 lb Date of Birth:  [birth year]        BSA:          2.315 m Patient Age:    62 years        BP:           138/70 mmHg Patient Gender: M               HR:           83 bpm. Exam Location:  [address]  Procedure: 2D Echo, Cardiac Doppler, Color Doppler and 3D Echo  Indications:    Aortic stenosis I35.0  History:        Patient has prior history of Echocardiogram examinations, most recent 10/06/2021. CAD, COPD, Aortic Valve Disease, Signs/Symptoms:Chest Pain and Dyspnea; Risk Factors:Diabetes, Hypertension and Dyslipidemia.  Sonographer:    Lauraine Pilot RDCS Referring Phys: [id number]  ROSALINE HERO SWINYER  IMPRESSIONS   1. Left ventricular ejection fraction, by estimation, is 50 to 55%. The left ventricle has low normal function. The left ventricle has no regional wall motion abnormalities. There is mild concentric left ventricular hypertrophy. Left ventricular diastolic parameters are consistent with Grade I diastolic dysfunction (impaired relaxation). 2. Right ventricular systolic function is normal. The right ventricular size is normal. There is normal pulmonary artery systolic pressure. The estimated right ventricular systolic pressure is 21.7 mmHg. 3. Left atrial size was mildly dilated. 4. Possible  rheumatic mitral valve. Trivial mitral valve regurgitation. Moderate mitral stenosis. The mean mitral valve gradient is 5.0 mmHg. MVA 1.7 cm^2. 5. The aortic valve is tricuspid. There is moderate calcification of the aortic valve. Aortic valve regurgitation is not visualized. Moderate aortic valve stenosis. Aortic valve area, by VTI measures 1.17 cm. Aortic valve mean gradient measures 21.0 mmHg. 6. The inferior vena cava is normal in size with greater than 50% respiratory variability, suggesting right atrial pressure of 3 mmHg.  FINDINGS Left Ventricle: Left ventricular ejection fraction, by estimation, is 50 to 55%. The left ventricle has low normal function. The left ventricle has no regional wall motion abnormalities. The left ventricular internal cavity size was normal in size. There is mild concentric left ventricular hypertrophy. Left ventricular diastolic parameters are consistent with Grade I diastolic dysfunction (impaired relaxation).  Right Ventricle: The right ventricular size is normal. No increase in right ventricular wall thickness. Right ventricular systolic function is normal. There is normal pulmonary artery systolic pressure. The tricuspid regurgitant velocity is 2.16 m/s, and with an assumed right atrial pressure of 3 mmHg, the estimated right ventricular  systolic pressure is 21.7 mmHg.  Left Atrium: Left atrial size was mildly dilated.  Right Atrium: Right atrial size was normal in size.  Pericardium: There is no evidence of pericardial effusion.  Mitral Valve: The mitral valve is abnormal. There is moderate calcification of the mitral valve leaflet(s). Mild mitral annular calcification. Trivial mitral valve regurgitation. Moderate mitral valve stenosis. MV peak gradient, 12.1 mmHg. The mean mitral valve gradient is 5.0 mmHg.  Tricuspid Valve: The tricuspid valve is normal in structure. Tricuspid valve regurgitation is trivial.  Aortic Valve: The aortic valve is tricuspid. There is moderate calcification of the aortic valve. Aortic valve regurgitation is not visualized. Moderate aortic stenosis is present. Aortic valve mean gradient measures 21.0 mmHg. Aortic valve peak gradient measures 31.1 mmHg. Aortic valve area, by VTI measures 1.17 cm.  Pulmonic Valve: The pulmonic valve was normal in structure. Pulmonic valve regurgitation is trivial.  Aorta: The aortic root is normal in size and structure.  Venous: The inferior vena cava is normal in size with greater than 50% respiratory variability, suggesting right atrial pressure of 3 mmHg.  IAS/Shunts: No atrial level shunt detected by color flow Doppler.   LEFT VENTRICLE PLAX 2D LVIDd:         4.08 cm   Diastology LVIDs:         2.99 cm   LV e' medial:    6.98 cm/s LV PW:         1.29 cm   LV E/e' medial:  18.9 LV IVS:        1.26 cm   LV e' lateral:   8.33 cm/s LVOT diam:     2.40 cm   LV E/e' lateral: 15.8 LV SV:         74 LV SV Index:   32 LVOT Area:     4.52 cm  3D Volume EF: 3D EF:        50 % LV EDV:       193 ml LV ESV:       97 ml LV SV:        96 ml  RIGHT VENTRICLE RV S prime:     12.40 cm/s TAPSE (M-mode): 2.3 cm  LEFT ATRIUM             Index        RIGHT ATRIUM  Index LA diam:        4.00 cm 1.73 cm/m   RA Area:     13.40 cm LA Vol (A2C):   70.3  ml 30.36 ml/m  RA Volume:   32.20 ml  13.91 ml/m LA Vol (A4C):   53.3 ml 23.02 ml/m LA Biplane Vol: 65.3 ml 28.20 ml/m AORTIC VALVE AV Area (Vmax):    1.39 cm AV Area (Vmean):   1.26 cm AV Area (VTI):     1.17 cm AV Vmax:           278.75 cm/s AV Vmean:          203.750 cm/s AV VTI:            0.632 m AV Peak Grad:      31.1 mmHg AV Mean Grad:      21.0 mmHg LVOT Vmax:         85.50 cm/s LVOT Vmean:        56.700 cm/s LVOT VTI:          0.163 m LVOT/AV VTI ratio: 0.26  AORTA Ao Root diam: 3.60 cm Ao Asc diam:  3.40 cm  MITRAL VALVE                TRICUSPID VALVE MV Area (PHT): 2.62 cm     TR Peak grad:   18.7 mmHg MV Area VTI:   1.71 cm     TR Vmax:        216.00 cm/s MV Peak grad:  12.1 mmHg MV Mean grad:  5.0 mmHg     SHUNTS MV Vmax:       1.74 m/s     Systemic VTI:  0.16 m MV Vmean:      106.0 cm/s   Systemic Diam: 2.40 cm MV E velocity: 132.00 cm/s MV A velocity: 156.00 cm/s MV E/A ratio:  0.85  Dalton McleanMD Electronically signed by Ezra Kanner Signature Date/Time: 10/08/2023/6:13:34 PM    Final             EKG:   EKG Interpretation Date/Time:  Friday October 18 2023 08:43:13 EST Ventricular Rate:  95 PR Interval:  164 QRS Duration:  84 QT Interval:  344 QTC Calculation: 432 R Axis:   30  Text Interpretation: Normal sinus rhythm Early repolarization Normal ECG When compared with ECG of 04-Sep-2023 08:20, PREVIOUS ECG IS PRESENTno change is found Confirmed by Wonda Sharper [phone]) on 10/18/2023 8:58:46 AM    Recent Labs: 09/04/2023: B Natriuretic Peptide 75.3 10/09/2023: ALT 17; BUN 27; Creatinine, Ser 1.33; Hemoglobin 12.1; Platelets 304; Potassium 4.4; Sodium 134  Recent Lipid Panel    Component Value Date/Time   CHOL 165 11/22/2022 0800   CHOL 114 02/18/2020 0912   TRIG 34 11/22/2022 0800   HDL 78 11/22/2022 0800   HDL 56 02/18/2020 0912   CHOLHDL 2.1 11/22/2022 0800   VLDL 7 11/22/2022 0800   LDLCALC 80 11/22/2022 0800    LDLCALC 46 02/18/2020 0912     Risk Assessment/Calculations:                Physical Exam:    VS:  BP 100/66   Pulse 95   Ht 6' 3 (1.905 m)   Wt 231 lb 9.6 oz (105.1 kg)   SpO2 98%   BMI 28.95 kg/m     Wt Readings from Last 3 Encounters:  10/18/23 231 lb 9.6 oz (105.1 kg)  10/09/23 226 lb 14.4 oz (102.9 kg)  10/03/23 226 lb 14.4 oz (102.9 kg)     GEN:  Well nourished, well developed in no acute distress HEENT: Normal NECK: No JVD; No carotid bruits LYMPHATICS: No lymphadenopathy CARDIAC: RRR, 3/6 harsh mid peaking crescendo decrescendo murmur best heard at the left lower sternal border, A2 is present RESPIRATORY:  Clear to auscultation without rales, wheezing or rhonchi  ABDOMEN: Soft, non-tender, non-distended MUSCULOSKELETAL:  No edema; left foot in a soft boot SKIN: Warm and dry NEUROLOGIC:  Alert and oriented x 3 PSYCHIATRIC:  Normal affect   Assessment & Plan Moderate aortic stenosis I discussed the natural history of aortic stenosis with the patient and his son today.  I personally reviewed his echo images which show low normal LVEF of 50 to 55%, normal RV function, moderately calcified and restricted aortic valve with mean transvalvular gradient 21 mmHg, and moderate mitral stenosis with mean transmitral gradient 5 mmHg.  We discussed the potential for progressive valvular heart disease and the need for ongoing surveillance.  The patient does not have any clear symptoms of heart failure or attributable symptoms to his valvular disease at this point.  It is somewhat difficult to sort out with his long history of smoking and known lung disease, but he reports no recent progression of symptoms.  I think ongoing surveillance is appropriate and I have recommended 19-month APP clinical follow-up and a repeat echocardiogram in 1 year when I would see him back after that study.  We talked about the Progress-CAP Trial for moderate aortic stenosis, but because of his age and  concomitant mitral valve disease he would not be an appropriate candidate for this.  We discussed the cardinal symptoms of aortic stenosis with progressive dyspnea, development of exertional chest discomfort, lightheadedness, syncope, or worsening fatigue.  I emphasized the importance of continued tobacco cessation, medication adherence, and healthy lifestyle with the patient and his son who is present today. Mitral valve stenosis, unspecified etiology The patient has moderate mitral stenosis with a mean transmitral gradient of 5 mmHg, he may have rheumatic valvular disease but is difficult to say with certainty based on his transthoracic echo.  As his valvular disease progresses, he will likely need transesophageal echo.  For now, would continue surveillance and medical therapy as detailed above.       Medication Adjustments/Labs and Tests Ordered: Current medicines are reviewed at length with the patient today.  Concerns regarding medicines are outlined above.  Orders Placed This Encounter  Procedures   EKG 12-Lead   ECHOCARDIOGRAM COMPLETE   No orders of the defined types were placed in this encounter.   Patient Instructions  Testing/Procedures: ECHO (prior to next year's visit) Your physician has requested that you have an echocardiogram. Echocardiography is a painless test that uses sound waves to create images of your heart. It provides your doctor with information about the size and shape of your heart and how well your heart's chambers and valves are working. This procedure takes approximately one hour. There are no restrictions for this procedure. Please do NOT wear cologne, perfume, aftershave, or lotions (deodorant is allowed). Please arrive 15 minutes prior to your appointment time.  Please note: We ask at that you not bring children with you during ultrasound (echo/ vascular) testing. Due to room size and safety concerns, children are not allowed in the ultrasound rooms during  exams. Our front office staff cannot provide observation of children in our lobby area while testing is being conducted. An adult accompanying a patient to their  appointment will only be allowed in the ultrasound room at the discretion of the ultrasound technician under special circumstances. We apologize for any inconvenience.  Follow-Up: At [facility], you and your health needs are our priority.  As part of our continuing mission to provide you with exceptional heart care, we have created designated Provider Care Teams.  These Care Teams include your primary Cardiologist (physician) and Advanced Practice Providers (APPs -  Physician Assistants and Nurse Practitioners) who all work together to provide you with the care you need, when you need it.  Your next appointment:   6 month(s)  Provider:   Orren Fabry, PA-C, Dayna Dunn, PA-C, Jackee Alberts, NP, Rosaline Bane, NP, Glendia Ferrier, PA-C, or Artist Pouch, PA-C     Then, Richard Fell, MD will plan to see you again in 1 year(s).        Signed, Richard Fell, MD  10/18/2023 10:09 AM    [facility]

## 2023-10-19 ENCOUNTER — Emergency Department (HOSPITAL_COMMUNITY)
Admission: EM | Admit: 2023-10-19 | Discharge: 2023-10-19 | Disposition: A | Discharge status: Home / Self Care | Payer: 59 | Attending: Emergency Medicine | Admitting: Emergency Medicine

## 2023-10-19 ENCOUNTER — Encounter (HOSPITAL_COMMUNITY): Payer: Self-pay | Admitting: *Deleted

## 2023-10-19 ENCOUNTER — Emergency Department (HOSPITAL_COMMUNITY): Payer: 59

## 2023-10-19 ENCOUNTER — Other Ambulatory Visit: Payer: Self-pay

## 2023-10-19 DIAGNOSIS — Z7902 Long term (current) use of antithrombotics/antiplatelets: Secondary | ICD-10-CM | POA: Insufficient documentation

## 2023-10-19 DIAGNOSIS — I739 Peripheral vascular disease, unspecified: Secondary | ICD-10-CM | POA: Diagnosis not present

## 2023-10-19 DIAGNOSIS — M79672 Pain in left foot: Secondary | ICD-10-CM | POA: Diagnosis not present

## 2023-10-19 DIAGNOSIS — G8918 Other acute postprocedural pain: Secondary | ICD-10-CM | POA: Diagnosis not present

## 2023-10-19 DIAGNOSIS — Z7982 Long term (current) use of aspirin: Secondary | ICD-10-CM | POA: Insufficient documentation

## 2023-10-19 DIAGNOSIS — Z89422 Acquired absence of other left toe(s): Secondary | ICD-10-CM | POA: Diagnosis not present

## 2023-10-19 DIAGNOSIS — Z794 Long term (current) use of insulin: Secondary | ICD-10-CM | POA: Diagnosis not present

## 2023-10-19 MED ORDER — HYDROMORPHONE HCL 1 MG/ML IJ SOLN
1.0000 mg | Freq: Once | INTRAMUSCULAR | Status: AC
  Administered 2023-10-19: 1 mg via INTRAMUSCULAR
  Filled 2023-10-19: qty 1

## 2023-10-19 MED ORDER — OXYCODONE-ACETAMINOPHEN 10-325 MG PO TABS: 1.0000 | ORAL_TABLET | Freq: Four times a day (QID) | ORAL | 0 refills | Status: AC | PRN

## 2023-10-19 NOTE — ED Triage Notes (Signed)
 Patient presents to ed c/o pain in his left foot. States he had his toes amp approx. 1 month ago , saw his doctor on Thurs and had his staples out . States his foot started swelling yest and is worse today. States his pain medications aren't working.

## 2023-10-19 NOTE — Discharge Instructions (Signed)
 Follow up with your Physician for recheck.  Continue antibiotic

## 2023-10-19 NOTE — ED Provider Notes (Signed)
 [facility] Provider Note   CSN: [id number] Arrival date & time: 10/19/23  0703     History  Chief Complaint  Patient presents with   Foot Pain    Richard [name] is a 63 y.o. male.  Pt complains of pain in his left foot.  Pt reports he had toes amputated on 12/11.  Pt complains that pain medication is not helping.  Pt ask for an xray to make sure everything looks okay.  Pt saw Podiatrist yesterday and was told he had swelling and needed to be wrapped.   The history is provided by the patient. No language interpreter was used.  Foot Pain This is a recurrent problem. The problem occurs constantly. Nothing aggravates the symptoms.       Home Medications Prior to Admission medications   Medication Sig Start Date End Date Taking? Authorizing Provider  oxyCODONE -acetaminophen  (PERCOCET) 10-325 MG tablet Take 1 tablet by mouth every 6 (six) hours as needed for up to 3 days for pain. 10/19/23 10/22/23 Yes Flint Sonny POUR, PA-C  albuterol  (PROVENTIL ) (2.5 MG/3ML) 0.083% nebulizer solution Take 3 mLs (2.5 mg total) by nebulization 2 (two) times daily. 04/18/23   Rosan Dayton [name], DO  albuterol  (VENTOLIN  HFA) 108 (90 Base) MCG/ACT inhaler Inhale 1-2 puffs into the lungs every 4 (four) hours as needed for wheezing or shortness of breath. 09/30/23   Rosan Dayton [name], DO  aspirin  EC 81 MG EC tablet Take 1 tablet (81 mg total) by mouth daily. 09/26/15   Burns, Johana SAUNDERS, MD  atorvastatin  (LIPITOR) 40 MG tablet Take 1 tablet (40 mg total) by mouth daily. 09/17/23   Rosan Dayton [name], DO  Budeson-Glycopyrrol-Formoterol  (BREZTRI  AEROSPHERE) 160-9-4.8 MCG/ACT AERO Inhale 2 puffs into the lungs in the morning and at bedtime. 12/05/22   Gladis Leonor HERO, MD  clopidogrel  (PLAVIX ) 75 MG tablet Take 1 tablet (75 mg total) by mouth daily. 09/26/23   Rosan Dayton [name], DO  Continuous Glucose Sensor (FREESTYLE LIBRE 3 SENSOR) MISC Place 1 sensor on the skin every 14 days.  Use to check glucose continuously 06/20/23   Rosan Dayton [name], DO  Dulaglutide  (TRULICITY ) 4.5 MG/0.5ML SOPN Inject 4.5 mg as directed once a week. 06/18/23   Tobie Gaines, DO  Empagliflozin -metFORMIN  HCl (SYNJARDY ) 12.02-999 MG TABS Take 1 tablet by mouth in the morning and at bedtime. 08/15/23 02/11/24  Francella Rogue, MD  ferrous sulfate  325 (65 FE) MG tablet Take 1 tablet (325 mg total) by mouth daily. 06/19/23 06/18/24  Tobie Gaines, DO  fluticasone  (FLONASE ) 50 MCG/ACT nasal spray Place 1 spray into both nostrils daily. 03/22/23   Rosan Dayton [name], DO  insulin  degludec (TRESIBA ) 100 UNIT/ML FlexTouch Pen Inject 20 Units into the skin daily. 06/20/23   Rosan Dayton [name], DO  Insulin  Pen Needle (PEN NEEDLES) 32G X 4 MM MISC 1 each by Does not apply route daily. 06/20/23   Rosan Dayton [name], DO  meloxicam  (MOBIC ) 15 MG tablet Take 1 tablet (15 mg total) by mouth daily as needed for pain. 10/14/23   Rosan Dayton [name], DO  Mepolizumab  (NUCALA ) 100 MG/ML SOAJ Inject 1 mL (100 mg total) into the skin every 28 (twenty-eight) days. 08/15/23   Francella Rogue, MD  montelukast  (SINGULAIR ) 10 MG tablet Take 1 tablet (10 mg total) by mouth at bedtime. 08/15/23   Francella Rogue, MD  mupirocin  ointment (BACTROBAN ) 2 % Place 1 Application into the nose 2 (two) times daily. 09/21/23   Tawkaliyar,  Roya, DO  polyethylene glycol powder (GLYCOLAX /MIRALAX ) 17 GM/SCOOP powder Take 17 g by mouth daily. 09/21/23   Tawkaliyar, Roya, DO  pregabalin  (LYRICA ) 100 MG capsule Take 1 capsule (100 mg total) by mouth 3 (three) times daily. 09/26/23   Rosan Dayton [name], DO  sulfamethoxazole -trimethoprim  (BACTRIM  DS) 800-160 MG tablet Take 1 tablet by mouth 2 (two) times daily for 10 days. 10/18/23 10/28/23  Lamount Ethan CROME, DPM      Allergies    Shellfish allergy    Review of Systems   Review of Systems  All other systems reviewed and are negative.   Physical Exam Updated Vital Signs BP 122/65   Pulse 71   Temp 98.7 F (37.1 C) (Oral)   Resp 16   Ht  6' (1.829 m)   Wt 107 kg   SpO2 97%   BMI 32.01 kg/m  Physical Exam Vitals and nursing note reviewed.  Constitutional:      Appearance: He is well-developed.  HENT:     Head: Normocephalic.  Cardiovascular:     Rate and Rhythm: Normal rate.  Pulmonary:     Effort: Pulmonary effort is normal.  Abdominal:     General: There is no distension.  Musculoskeletal:        General: Normal range of motion.     Cervical back: Normal range of motion.     Comments: Healing surgical incision distal left foot,  no drainage, no odor, nv and ns intact  Skin:    General: Skin is warm.  Neurological:     General: No focal deficit present.     Mental Status: He is alert and oriented to person, place, and time.     ED Results / Procedures / Treatments   Labs (all labs ordered are listed, but only abnormal results are displayed) Labs Reviewed - No data to display  EKG None  Radiology DG Foot Complete Left Result Date: 10/19/2023 CLINICAL DATA:  63 year old male with postoperative foot pain. EXAM: LEFT FOOT - COMPLETE 3+ VIEW COMPARISON:  10/09/2023. FINDINGS: 3 portable views at 0822 hours. Left foot amputation at the level of the mid metatarsal shafts redemonstrated. Underlying calcified peripheral vascular disease. Satisfactory osteotomy sites. Skin staples removed, otherwise stable postoperative soft tissue appearance. Hindfoot appears stable and intact. Background bone mineralization remains normal. IMPRESSION: Satisfactory postoperative radiographic appearance of the left foot following recent mid metatarsal level amputation. Underlying calcified peripheral vascular disease. Electronically Signed   By: VEAR Hurst M.D.   On: 10/19/2023 08:30    Procedures Procedures    Medications Ordered in ED Medications  HYDROmorphone  (DILAUDID ) injection 1 mg (1 mg Intramuscular Given 10/19/23 9187)    ED Course/ Medical Decision Making/ A&P                                 Medical Decision  Making Pt complains of pain at surgery site  Amount and/or Complexity of Data Reviewed Radiology: ordered and independent interpretation performed. Decision-making details documented in ED Course.    Details: Xray left foot,  no osteo, no gas  Risk Prescription drug management. Risk Details: Pt given dilaudid  1mg  IM.  Pt reports pain relief.  Pt rewrapped.  Pt given rx for percocet 10mg  x 3 days.  Pt is scheduled to recheck with his Podiatrist on Tuesday            Final Clinical Impression(s) / ED Diagnoses Final diagnoses:  Acute post-operative pain    Rx / DC Orders ED Discharge Orders          Ordered    oxyCODONE -acetaminophen  (PERCOCET) 10-325 MG tablet  Every 6 hours PRN        10/19/23 0907           An After Visit Summary was printed and given to the patient.    Flint Sonny POUR, PA-C 10/19/23 9081    Emil Share, DO 10/19/23 1424

## 2023-10-22 ENCOUNTER — Encounter: Payer: Self-pay | Admitting: Podiatry

## 2023-10-22 ENCOUNTER — Ambulatory Visit (INDEPENDENT_AMBULATORY_CARE_PROVIDER_SITE_OTHER): Payer: 59

## 2023-10-22 ENCOUNTER — Ambulatory Visit (INDEPENDENT_AMBULATORY_CARE_PROVIDER_SITE_OTHER): Payer: 59 | Admitting: Podiatry

## 2023-10-22 DIAGNOSIS — Z89432 Acquired absence of left foot: Secondary | ICD-10-CM | POA: Diagnosis not present

## 2023-10-22 DIAGNOSIS — Z9889 Other specified postprocedural states: Secondary | ICD-10-CM

## 2023-10-22 NOTE — Progress Notes (Signed)
  Subjective:  Patient ID: Richard [name], male    DOB: [birth year],  MRN: [id number]   DOS: 09/18/23 Procedure: Left foot Trans metatarsal amputation, tendo achilles lengthening  63 y.o. male seen for post op check.   He had to be seen by Dr. Lamount last week due to concern for dehiscence and wound on the amputation site.  He reports he is having a lot of neuropathic type pains including burning stinging and other sensations in the left foot.  He has not seen a whole lot of bleeding from the left foot at this time.  He is walking in a cam boot.  Has kept the area dry as instructed.  Review of Systems: Negative except as noted in the HPI. Denies N/V/F/Ch.   Objective:  There were no vitals filed for this visit. There is no height or weight on file to calculate BMI. Constitutional Well developed. Well nourished.  Vascular Foot warm and well perfused. Capillary refill normal to all digits.   No calf pain with palpation  Neurologic Normal speech. Oriented to person, place, and time. Epicritic sensation Absent to forefoot.  Dermatologic Small 2 x 0.4 cm dehiscence on the central aspect of the TMA site.  Mild fibrotic tissues but no deep probing wound or purulence expressed.  Majority of incision is well-healed with some eschar present laterally.  Upon debridement there is healthy granular tissue in the wound base.     Orthopedic: S/p Transmet amp to the left foot.   Radiographs: 10/22/2023 XR 3 views AP lateral bleak of the left foot weightbearing.  Findings: No evidence of osteolysis or erosions noted on today's x-rays of the distal metatarsal stump site no evidence of subcutaneous emphysema  Pathology: Acute and chronic osteomyelitis  Micro: E Cloacae.   Assessment:  S/p L foot TMA, TAL Plan:  Patient was evaluated and treated and all questions answered.  5-6 wk post ops/p L foot TMA, TAL -Progressing well though a small dehiscence superficially at the distal aspect of the  amputation site centrally -Will proceed with wound care including packing dressing changes.  Place a superficial amount of iodoform gauze in the distal aspect of the wound.   -Cover rest of wound with 4 x 4 ABD pad Kerlix Ace wrap -XR: No evidence residual osteomyelitis of the resected metatarsals -WB Status:  weightbearing to the left foot in cam boot though I recommended reduced weightbearing is much as possible -Sutures: Previously removed -Medications/ABX: Finished course of abx, no indication for more at this time as no evidence of infection at the dehiscence site -Avoid getting the foot warm in the water  we will leave the dressing on until next appointment 1 week -Order for home health RN for dressing changes placed did tell patient it could be a while before we can get this set up as an outpatient. -Follow-up in 1 wk        Marolyn JULIANNA Honour, DPM [facility] / [facility]                   10/22/2023          Marolyn JULIANNA Honour, DPM [facility] / [facility]

## 2023-10-29 ENCOUNTER — Telehealth: Payer: Self-pay | Admitting: Podiatry

## 2023-10-29 ENCOUNTER — Encounter: Payer: Self-pay | Admitting: Podiatry

## 2023-10-29 ENCOUNTER — Ambulatory Visit (INDEPENDENT_AMBULATORY_CARE_PROVIDER_SITE_OTHER): Payer: 59 | Admitting: Podiatry

## 2023-10-29 VITALS — Ht 72.0 in | Wt 236.0 lb

## 2023-10-29 DIAGNOSIS — T8130XA Disruption of wound, unspecified, initial encounter: Secondary | ICD-10-CM

## 2023-10-29 DIAGNOSIS — Z89432 Acquired absence of left foot: Secondary | ICD-10-CM

## 2023-10-29 DIAGNOSIS — Z9889 Other specified postprocedural states: Secondary | ICD-10-CM

## 2023-10-29 NOTE — Telephone Encounter (Signed)
Wants to know if you would want to see him on01/30 or 02/04?

## 2023-10-29 NOTE — Progress Notes (Signed)
  Subjective:  Patient ID: Richard [name], male    DOB: Jan 17, 1961,  MRN: [id number]   DOS: 09/18/23 Procedure: Left foot Trans metatarsal amputation, tendo achilles lengthening  63 y.o. male seen for post op check.    Following up for wound in the transmetatarsal amputation site.  Review of Systems: Negative except as noted in the HPI. Denies N/V/F/Ch.   Objective:  There were no vitals filed for this visit. There is no height or weight on file to calculate BMI. Constitutional Well developed. Well nourished.  Vascular Foot warm and well perfused. Capillary refill normal to all digits.   No calf pain with palpation  Neurologic Normal speech. Oriented to person, place, and time. Epicritic sensation Absent to forefoot.  Dermatologic Small 2 x 0.4 cm dehiscence on the central aspect of the TMA site.  Mild fibrotic tissues but no deep probing wound or purulence expressed.  Majority of incision is well-healed with some eschar present laterally.  Upon debridement there is healthy granular tissue in the wound base.  Overall similar to prior.  Possibly slightly improved     Orthopedic: S/p Transmet amp to the left foot.   Radiographs: 10/22/2023 XR 3 views AP lateral bleak of the left foot weightbearing.  Findings: No evidence of osteolysis or erosions noted on today's x-rays of the distal metatarsal stump site no evidence of subcutaneous emphysema  Pathology: Acute and chronic osteomyelitis  Micro: E Cloacae.   Assessment:  S/p L foot TMA, TAL Plan:  Patient was evaluated and treated and all questions answered.  7 wk post ops/p L foot TMA, TAL -Progressing well though a small dehiscence superficially at the distal aspect of the amputation site centrally, slightly improved wound care -Will proceed with wound care including Xeroform dressings -Cover rest of wound with 4 x 4 ABD pad Kerlix Ace wrap -XR: Deferred today prior XR without evidence of residual osteomyelitis -WB Status:   weightbearing to the left foot in cam boot though I recommended reduced weightbearing is much as possible -Sutures: Previously removed -Medications/ABX: No current indication for antibiotics -Order for wound care center referral placed.  Hopefully patient will be able to get in soon.  Would like him to have dressing changes at least once a week. -Order for home health RN for dressing changes placed for second time. -If no improvement in future with the wound or gets worse I will consider surgical excision of the dehisced area and potentially perform a delayed primary closure -Follow-up in 2 wk        [name], DPM [facility] / [facility]                   10/29/2023          [name], DPM [facility] / [facility]

## 2023-10-31 ENCOUNTER — Ambulatory Visit (INDEPENDENT_AMBULATORY_CARE_PROVIDER_SITE_OTHER): Payer: 59 | Admitting: Internal Medicine

## 2023-10-31 VITALS — BP 117/66 | HR 95 | Temp 98.4°F | Ht 72.0 in | Wt 231.0 lb

## 2023-10-31 DIAGNOSIS — I10 Essential (primary) hypertension: Secondary | ICD-10-CM | POA: Diagnosis not present

## 2023-10-31 DIAGNOSIS — Z794 Long term (current) use of insulin: Secondary | ICD-10-CM | POA: Diagnosis not present

## 2023-10-31 DIAGNOSIS — E1151 Type 2 diabetes mellitus with diabetic peripheral angiopathy without gangrene: Secondary | ICD-10-CM | POA: Diagnosis not present

## 2023-10-31 DIAGNOSIS — F3342 Major depressive disorder, recurrent, in full remission: Secondary | ICD-10-CM | POA: Insufficient documentation

## 2023-10-31 DIAGNOSIS — Z7984 Long term (current) use of oral hypoglycemic drugs: Secondary | ICD-10-CM

## 2023-10-31 DIAGNOSIS — G5601 Carpal tunnel syndrome, right upper limb: Secondary | ICD-10-CM

## 2023-10-31 DIAGNOSIS — F332 Major depressive disorder, recurrent severe without psychotic features: Secondary | ICD-10-CM | POA: Diagnosis not present

## 2023-10-31 DIAGNOSIS — Z7985 Long-term (current) use of injectable non-insulin antidiabetic drugs: Secondary | ICD-10-CM | POA: Diagnosis not present

## 2023-10-31 DIAGNOSIS — J4489 Other specified chronic obstructive pulmonary disease: Secondary | ICD-10-CM

## 2023-10-31 DIAGNOSIS — Z89432 Acquired absence of left foot: Secondary | ICD-10-CM | POA: Diagnosis not present

## 2023-10-31 MED ORDER — BENZONATATE 200 MG PO CAPS: 200.0000 mg | ORAL_CAPSULE | Freq: Three times a day (TID) | ORAL | 1 refills | Status: DC | PRN

## 2023-10-31 MED ORDER — TRIAMCINOLONE ACETONIDE 0.5 % EX OINT: 1.0000 | TOPICAL_OINTMENT | Freq: Two times a day (BID) | CUTANEOUS | 3 refills | Status: DC

## 2023-10-31 MED ORDER — ESCITALOPRAM OXALATE 5 MG PO TABS: 5.0000 mg | ORAL_TABLET | Freq: Every day | ORAL | 2 refills | Status: DC

## 2023-10-31 MED ORDER — TRULICITY 4.5 MG/0.5ML ~~LOC~~ SOAJ: 4.5000 mg | SUBCUTANEOUS | 5 refills | Status: DC

## 2023-10-31 NOTE — Progress Notes (Signed)
Established Patient Office Visit  Subjective   Patient ID: Richard [name], male    DOB: 08/01/1961  Age: 63 y.o. MRN: [id number]  Chief Complaint  Patient presents with   Follow-up   Medication Refill   Richard [name] is here today following up mainly for diabetes and transmetatarsal amputation of the left foot.  He has been following with Dr. Annamary Rummage with podiatry.  He recently has had some wound dehiscence and saw Dr. Verna Czech 2 days ago.  Referral orders have been replaced to home health nursing as well as the wound care center.  He denies any drainage from the site fever or chills or increased pain to the area.  For his diabetes he has been adherent to his [place] and Trulicity he has been taking 20 units of [place] daily.  Brief review of his CGM on his phone shows he is roughly in the target range for blood sugars average sugar over the last 14 days at 7.1%.   He is not smoking and thinks his breathing is doing well he attributes this to the lack of smoking as well as being winter.  He is occasionally having some numbness and tingling in the 3 middle fingers of his right hand.  Has not dropped anything.  No limitations of shoulder movement no neck pain.  He has been feeling a little bit more down and depressed.  He is having some trouble sleeping.  He thinks his appetite is decreased but he thinks it is also a good thing with his diabetes.  He was previously on psychiatry but stopped going because he did not like one of the doctors he was seeing.  He also does not want to be on "crazy medication."  He has had some cough for the last few days, not very productive. No fever or chills. Concerned it "may turn into something"      Objective:     BP 117/66 (BP Location: Right Arm, Patient Position: Sitting, Cuff Size: Normal)   Pulse 95   Temp 98.4 F (36.9 C) (Oral)   Ht 6' (1.829 m)   Wt 231 lb (104.8 kg)   SpO2 98%   BMI 31.33 kg/m  BP Readings from Last 3 Encounters:   10/31/23 117/66  10/19/23 122/65  10/18/23 100/66   Wt Readings from Last 3 Encounters:  10/31/23 231 lb (104.8 kg)  10/29/23 236 lb (107 kg)  10/19/23 236 lb (107 kg)      Physical Exam Constitutional:      Appearance: He is not ill-appearing.  Cardiovascular:     Rate and Rhythm: Normal rate and regular rhythm.  Pulmonary:     Effort: Pulmonary effort is normal.     Breath sounds: Normal breath sounds.  Musculoskeletal:     Comments: Normal active ROM of left shoulder.  Skin:    Comments: Diffuse hyperpigmented rash with excoriations across extensor surfaces of bilateral arms and chest.  Neurological:     General: No focal deficit present.     Mental Status: He is alert and oriented to person, place, and time.  Psychiatric:        Mood and Affect: Mood normal.        Behavior: Behavior normal.      No results found for any visits on 10/31/23.  Last CBC Lab Results  Component Value Date   WBC 4.1 10/09/2023   HGB 12.1 (L) 10/09/2023   HCT 38.9 (L) 10/09/2023   MCV 82.1 10/09/2023   MCH  25.5 (L) 10/09/2023   RDW 14.6 10/09/2023   PLT 304 10/09/2023   Last metabolic panel Lab Results  Component Value Date   GLUCOSE 95 10/09/2023   NA 134 (L) 10/09/2023   K 4.4 10/09/2023   CL 103 10/09/2023   CO2 22 10/09/2023   BUN 27 (H) 10/09/2023   CREATININE 1.33 (H) 10/09/2023   GFRNONAA 60 (L) 10/09/2023   CALCIUM 9.0 10/09/2023   PHOS 4.9 (H) 04/08/2023   PROT 6.7 10/09/2023   ALBUMIN 3.1 (L) 10/09/2023   LABGLOB 2.5 08/17/2021   AGRATIO 1.7 08/17/2021   BILITOT 0.7 10/09/2023   ALKPHOS 93 10/09/2023   AST 16 10/09/2023   ALT 17 10/09/2023   ANIONGAP 9 10/09/2023   Last lipids Lab Results  Component Value Date   CHOL 165 11/22/2022   HDL 78 11/22/2022   LDLCALC 80 11/22/2022   TRIG 34 11/22/2022   CHOLHDL 2.1 11/22/2022   Last hemoglobin A1c Lab Results  Component Value Date   HGBA1C 7.9 (H) 09/19/2023      The 10-year ASCVD risk score  (Arnett DK, et al., 2019) is: 12.5%    Assessment & Plan:   Problem List Items Addressed This Visit       Cardiovascular and Mediastinum   Type 2 diabetes mellitus with peripheral artery disease (HCC) - Primary (Chronic)   Overall I think his diabetes is better controlled after the addition of insulin.  Will not make any changes to his regimen continue Synjardy, Trulicity and 20 units of Tresiba.  Due for A1c at next visit.      Relevant Medications   Dulaglutide (TRULICITY) 4.5 MG/0.5ML SOAJ   Other Relevant Orders   BMP8+Anion Gap   Essential hypertension (Chronic)   Blood pressure at goal.        Respiratory   Asthma-COPD overlap syndrome (HCC) (Chronic)   Doing well reinforced to stay off cigarettes.      Relevant Medications   benzonatate (TESSALON) 200 MG capsule     Nervous and Auditory   Carpal tunnel syndrome of right wrist   I suspect the numbness and tingling of his right middle 3 fingers is due to mild to moderate carpal tunnel.  Recommend wrist splints to wear at night.      Relevant Medications   escitalopram (LEXAPRO) 5 MG tablet     Other   S/P transmetatarsal amputation of foot, left (HCC)   Following with podiatry has been referred to wound care center.  Keeping his cam boot on and trying to do offloading.  Notes he is having some difficulty keeping pressure off when he has to get up to go the bathroom at night he urinates about twice a night.  Hesitant to stop Jardiance at this time.  Provided him with a bedside urinal which I hope will help with offloading.      MDD (major depressive disorder), recurrent episode, severe (HCC)   No longer seeing psychiatry.  Will try starting Lexapro 5 mg daily titrate up slowly as needed.      Relevant Medications   escitalopram (LEXAPRO) 5 MG tablet    Return in about 3 months (around 01/29/2024).    [name], DO

## 2023-10-31 NOTE — Assessment & Plan Note (Signed)
I suspect the numbness and tingling of his right middle 3 fingers is due to mild to moderate carpal tunnel.  Recommend wrist splints to wear at night.

## 2023-10-31 NOTE — Assessment & Plan Note (Signed)
Following with podiatry has been referred to wound care center.  Keeping his cam boot on and trying to do offloading.  Notes he is having some difficulty keeping pressure off when he has to get up to go the bathroom at night he urinates about twice a night.  Hesitant to stop Jardiance at this time.  Provided him with a bedside urinal which I hope will help with offloading.

## 2023-10-31 NOTE — Assessment & Plan Note (Signed)
Blood pressure at goal

## 2023-10-31 NOTE — Assessment & Plan Note (Signed)
Doing well reinforced to stay off cigarettes.

## 2023-10-31 NOTE — Assessment & Plan Note (Addendum)
Overall I think his diabetes is better controlled after the addition of insulin.  Will not make any changes to his regimen continue Synjardy, Trulicity and 20 units of Tresiba.  Due for A1c at next visit.

## 2023-10-31 NOTE — Assessment & Plan Note (Signed)
No longer seeing psychiatry.  Will try starting Lexapro 5 mg daily titrate up slowly as needed.

## 2023-11-01 ENCOUNTER — Other Ambulatory Visit: Payer: Self-pay

## 2023-11-01 ENCOUNTER — Other Ambulatory Visit (HOSPITAL_COMMUNITY): Payer: Self-pay

## 2023-11-01 LAB — BMP8+ANION GAP
Anion Gap: 15 mmol/L (ref 10.0–18.0)
BUN/Creatinine Ratio: 17 (ref 10–24)
BUN: 18 mg/dL (ref 8–27)
CO2: 22 mmol/L (ref 20–29)
Calcium: 9.7 mg/dL (ref 8.6–10.2)
Chloride: 100 mmol/L (ref 96–106)
Creatinine, Ser: 1.03 mg/dL (ref 0.76–1.27)
Glucose: 141 mg/dL — ABNORMAL HIGH (ref 70–99)
Potassium: 5.4 mmol/L — ABNORMAL HIGH (ref 3.5–5.2)
Sodium: 137 mmol/L (ref 134–144)
eGFR: 81 mL/min/{1.73_m2} (ref >59)

## 2023-11-01 NOTE — Progress Notes (Signed)
Specialty Pharmacy Refill Coordination Note  Richard [name] is a 63 y.o. male contacted today regarding refills of specialty medication(s) Mepolizumab [place] Crews)   Patient requested Delivery   Delivery date: 11/08/23   Verified address: [address]  [place] [address]   Medication will be filled on 11/07/23.

## 2023-11-05 ENCOUNTER — Observation Stay (HOSPITAL_COMMUNITY)
Admission: EM | Admit: 2023-11-05 | Discharge: 2023-11-07 | Disposition: A | Discharge status: Home / Self Care | Payer: 59 | Attending: Internal Medicine | Admitting: Internal Medicine

## 2023-11-05 ENCOUNTER — Other Ambulatory Visit: Payer: Self-pay

## 2023-11-05 ENCOUNTER — Encounter (HOSPITAL_COMMUNITY): Payer: Self-pay

## 2023-11-05 ENCOUNTER — Emergency Department (HOSPITAL_COMMUNITY): Payer: 59

## 2023-11-05 DIAGNOSIS — Z7982 Long term (current) use of aspirin: Secondary | ICD-10-CM | POA: Insufficient documentation

## 2023-11-05 DIAGNOSIS — I350 Nonrheumatic aortic (valve) stenosis: Secondary | ICD-10-CM

## 2023-11-05 DIAGNOSIS — E1151 Type 2 diabetes mellitus with diabetic peripheral angiopathy without gangrene: Secondary | ICD-10-CM | POA: Diagnosis present

## 2023-11-05 DIAGNOSIS — E1140 Type 2 diabetes mellitus with diabetic neuropathy, unspecified: Secondary | ICD-10-CM | POA: Insufficient documentation

## 2023-11-05 DIAGNOSIS — E1165 Type 2 diabetes mellitus with hyperglycemia: Secondary | ICD-10-CM | POA: Diagnosis not present

## 2023-11-05 DIAGNOSIS — Z79899 Other long term (current) drug therapy: Secondary | ICD-10-CM | POA: Insufficient documentation

## 2023-11-05 DIAGNOSIS — J441 Chronic obstructive pulmonary disease with (acute) exacerbation: Principal | ICD-10-CM | POA: Diagnosis present

## 2023-11-05 DIAGNOSIS — Z89432 Acquired absence of left foot: Secondary | ICD-10-CM | POA: Diagnosis not present

## 2023-11-05 DIAGNOSIS — M940 Chondrocostal junction syndrome [Tietze]: Secondary | ICD-10-CM | POA: Diagnosis not present

## 2023-11-05 DIAGNOSIS — R0602 Shortness of breath: Secondary | ICD-10-CM | POA: Diagnosis not present

## 2023-11-05 DIAGNOSIS — I2510 Atherosclerotic heart disease of native coronary artery without angina pectoris: Secondary | ICD-10-CM | POA: Diagnosis not present

## 2023-11-05 DIAGNOSIS — Z794 Long term (current) use of insulin: Secondary | ICD-10-CM | POA: Insufficient documentation

## 2023-11-05 DIAGNOSIS — Z1152 Encounter for screening for COVID-19: Secondary | ICD-10-CM | POA: Insufficient documentation

## 2023-11-05 DIAGNOSIS — R079 Chest pain, unspecified: Secondary | ICD-10-CM | POA: Diagnosis not present

## 2023-11-05 DIAGNOSIS — I739 Peripheral vascular disease, unspecified: Secondary | ICD-10-CM | POA: Diagnosis present

## 2023-11-05 DIAGNOSIS — Z7902 Long term (current) use of antithrombotics/antiplatelets: Secondary | ICD-10-CM | POA: Diagnosis not present

## 2023-11-05 LAB — CBC WITH DIFFERENTIAL/PLATELET
Abs Immature Granulocytes: 0.06 10*3/uL (ref 0.00–0.07)
Basophils Absolute: 0.1 10*3/uL (ref 0.0–0.1)
Basophils Relative: 1 %
Eosinophils Absolute: 0.1 10*3/uL (ref 0.0–0.5)
Eosinophils Relative: 2 %
HCT: 41.2 % (ref 39.0–52.0)
Hemoglobin: 12.7 g/dL — ABNORMAL LOW (ref 13.0–17.0)
Immature Granulocytes: 1 %
Lymphocytes Relative: 15 %
Lymphs Abs: 0.8 10*3/uL (ref 0.7–4.0)
MCH: 25.7 pg — ABNORMAL LOW (ref 26.0–34.0)
MCHC: 30.8 g/dL (ref 30.0–36.0)
MCV: 83.4 fL (ref 80.0–100.0)
Monocytes Absolute: 0.6 10*3/uL (ref 0.1–1.0)
Monocytes Relative: 12 %
Neutro Abs: 3.6 10*3/uL (ref 1.7–7.7)
Neutrophils Relative %: 69 %
Platelets: 317 10*3/uL (ref 150–400)
RBC: 4.94 MIL/uL (ref 4.22–5.81)
RDW: 15.7 % — ABNORMAL HIGH (ref 11.5–15.5)
WBC: 5.3 10*3/uL (ref 4.0–10.5)
nRBC: 0 % (ref 0.0–0.2)

## 2023-11-05 LAB — COMPREHENSIVE METABOLIC PANEL
ALT: 27 U/L (ref 0–44)
AST: 24 U/L (ref 15–41)
Albumin: 3.6 g/dL (ref 3.5–5.0)
Alkaline Phosphatase: 99 U/L (ref 38–126)
Anion gap: 15 (ref 5–15)
BUN: 12 mg/dL (ref 8–23)
CO2: 23 mmol/L (ref 22–32)
Calcium: 9.1 mg/dL (ref 8.9–10.3)
Chloride: 102 mmol/L (ref 98–111)
Creatinine, Ser: 1.15 mg/dL (ref 0.61–1.24)
GFR, Estimated: >60 mL/min (ref >60)
Glucose, Bld: 245 mg/dL — ABNORMAL HIGH (ref 70–99)
Potassium: 4.9 mmol/L (ref 3.5–5.1)
Sodium: 140 mmol/L (ref 135–145)
Total Bilirubin: 0.6 mg/dL (ref 0.0–1.2)
Total Protein: 7.3 g/dL (ref 6.5–8.1)

## 2023-11-05 LAB — RESP PANEL BY RT-PCR (RSV, FLU A&B, COVID)  RVPGX2
Influenza A by PCR: NEGATIVE
Influenza B by PCR: NEGATIVE
Resp Syncytial Virus by PCR: NEGATIVE
SARS Coronavirus 2 by RT PCR: NEGATIVE

## 2023-11-05 LAB — CBG MONITORING, ED
Glucose-Capillary: 206 mg/dL — ABNORMAL HIGH (ref 70–99)
Glucose-Capillary: 262 mg/dL — ABNORMAL HIGH (ref 70–99)

## 2023-11-05 LAB — TROPONIN I (HIGH SENSITIVITY)
Troponin I (High Sensitivity): 5 ng/L (ref <18)
Troponin I (High Sensitivity): 4 ng/L (ref <18)

## 2023-11-05 MED ORDER — BUDESON-GLYCOPYRROL-FORMOTEROL 160-9-4.8 MCG/ACT IN AERO: 2.0000 | INHALATION_SPRAY | Freq: Two times a day (BID) | RESPIRATORY_TRACT | Status: DC

## 2023-11-05 MED ORDER — INSULIN ASPART 100 UNIT/ML IJ SOLN
0.0000 [IU] | Freq: Three times a day (TID) | INTRAMUSCULAR | Status: DC
  Administered 2023-11-05: 7 [IU] via SUBCUTANEOUS
  Administered 2023-11-06: 4 [IU] via SUBCUTANEOUS
  Administered 2023-11-06: 11 [IU] via SUBCUTANEOUS
  Administered 2023-11-06: 4 [IU] via SUBCUTANEOUS
  Administered 2023-11-07: 7 [IU] via SUBCUTANEOUS

## 2023-11-05 MED ORDER — ACETAMINOPHEN 650 MG RE SUPP: 650.0000 mg | Freq: Four times a day (QID) | RECTAL | Status: DC | PRN

## 2023-11-05 MED ORDER — OXYCODONE HCL 5 MG PO TABS
5.0000 mg | ORAL_TABLET | ORAL | Status: DC | PRN
  Administered 2023-11-05 – 2023-11-07 (×5): 5 mg via ORAL
  Filled 2023-11-05 (×5): qty 1

## 2023-11-05 MED ORDER — MONTELUKAST SODIUM 10 MG PO TABS
10.0000 mg | ORAL_TABLET | Freq: Every day | ORAL | Status: DC
  Administered 2023-11-05 – 2023-11-06 (×2): 10 mg via ORAL
  Filled 2023-11-05 (×2): qty 1

## 2023-11-05 MED ORDER — IPRATROPIUM-ALBUTEROL 0.5-2.5 (3) MG/3ML IN SOLN
3.0000 mL | Freq: Four times a day (QID) | RESPIRATORY_TRACT | Status: AC
Start: 2023-11-05 — End: 2023-11-05
  Administered 2023-11-05 (×2): 3 mL via RESPIRATORY_TRACT
  Filled 2023-11-05 (×2): qty 3

## 2023-11-05 MED ORDER — ASPIRIN 81 MG PO TBEC
81.0000 mg | DELAYED_RELEASE_TABLET | Freq: Every day | ORAL | Status: DC
  Administered 2023-11-06 – 2023-11-07 (×2): 81 mg via ORAL
  Filled 2023-11-05 (×2): qty 1

## 2023-11-05 MED ORDER — METFORMIN HCL 500 MG PO TABS
1000.0000 mg | ORAL_TABLET | Freq: Two times a day (BID) | ORAL | Status: DC
  Administered 2023-11-06 – 2023-11-07 (×3): 1000 mg via ORAL
  Filled 2023-11-05 (×3): qty 2

## 2023-11-05 MED ORDER — CLOPIDOGREL BISULFATE 75 MG PO TABS
75.0000 mg | ORAL_TABLET | Freq: Every day | ORAL | Status: DC
  Administered 2023-11-06 – 2023-11-07 (×2): 75 mg via ORAL
  Filled 2023-11-05 (×2): qty 1

## 2023-11-05 MED ORDER — PREDNISONE 20 MG PO TABS
40.0000 mg | ORAL_TABLET | Freq: Every day | ORAL | Status: DC
  Administered 2023-11-06 – 2023-11-07 (×2): 40 mg via ORAL
  Filled 2023-11-05 (×2): qty 2

## 2023-11-05 MED ORDER — FLUTICASONE FUROATE-VILANTEROL 100-25 MCG/ACT IN AEPB
1.0000 | INHALATION_SPRAY | Freq: Every day | RESPIRATORY_TRACT | Status: DC
  Administered 2023-11-07: 1 via RESPIRATORY_TRACT
  Filled 2023-11-05 (×2): qty 28

## 2023-11-05 MED ORDER — EMPAGLIFLOZIN 25 MG PO TABS
25.0000 mg | ORAL_TABLET | Freq: Every day | ORAL | Status: DC
  Administered 2023-11-06 – 2023-11-07 (×2): 25 mg via ORAL
  Filled 2023-11-05 (×3): qty 1

## 2023-11-05 MED ORDER — ATORVASTATIN CALCIUM 40 MG PO TABS
40.0000 mg | ORAL_TABLET | Freq: Every day | ORAL | Status: DC
  Administered 2023-11-06 – 2023-11-07 (×2): 40 mg via ORAL
  Filled 2023-11-05 (×2): qty 1

## 2023-11-05 MED ORDER — ACETAMINOPHEN 325 MG PO TABS: 650.0000 mg | ORAL_TABLET | Freq: Four times a day (QID) | ORAL | Status: DC | PRN

## 2023-11-05 MED ORDER — MORPHINE SULFATE (PF) 4 MG/ML IV SOLN
4.0000 mg | Freq: Once | INTRAVENOUS | Status: AC
  Administered 2023-11-05: 4 mg via INTRAVENOUS
  Filled 2023-11-05: qty 1

## 2023-11-05 MED ORDER — PREGABALIN 100 MG PO CAPS
100.0000 mg | ORAL_CAPSULE | Freq: Three times a day (TID) | ORAL | Status: DC
  Administered 2023-11-05 – 2023-11-07 (×5): 100 mg via ORAL
  Filled 2023-11-05 (×5): qty 1

## 2023-11-05 MED ORDER — ONDANSETRON HCL 4 MG/2ML IJ SOLN
4.0000 mg | Freq: Once | INTRAMUSCULAR | Status: AC
  Administered 2023-11-05: 4 mg via INTRAVENOUS
  Filled 2023-11-05: qty 2

## 2023-11-05 MED ORDER — EMPAGLIFLOZIN-METFORMIN HCL 12.5-1000 MG PO TABS: 1.0000 | ORAL_TABLET | Freq: Two times a day (BID) | ORAL | Status: DC

## 2023-11-05 MED ORDER — ACETAMINOPHEN 500 MG PO TABS
1000.0000 mg | ORAL_TABLET | Freq: Three times a day (TID) | ORAL | Status: DC
  Administered 2023-11-05 – 2023-11-07 (×5): 1000 mg via ORAL
  Filled 2023-11-05 (×5): qty 2

## 2023-11-05 MED ORDER — UMECLIDINIUM BROMIDE 62.5 MCG/ACT IN AEPB
1.0000 | INHALATION_SPRAY | Freq: Every day | RESPIRATORY_TRACT | Status: DC
  Filled 2023-11-05 (×2): qty 7

## 2023-11-05 MED ORDER — MAGNESIUM SULFATE 2 GM/50ML IV SOLN
2.0000 g | Freq: Once | INTRAVENOUS | Status: AC
Start: 2023-11-05 — End: 2023-11-05
  Administered 2023-11-05: 2 g via INTRAVENOUS
  Filled 2023-11-05: qty 50

## 2023-11-05 MED ORDER — LIDOCAINE 5 % EX PTCH
1.0000 | MEDICATED_PATCH | CUTANEOUS | Status: DC
  Administered 2023-11-05 – 2023-11-06 (×2): 1 via TRANSDERMAL
  Filled 2023-11-05 (×2): qty 1

## 2023-11-05 MED ORDER — ALBUTEROL SULFATE (2.5 MG/3ML) 0.083% IN NEBU
2.5000 mg | INHALATION_SOLUTION | Freq: Once | RESPIRATORY_TRACT | Status: AC
  Administered 2023-11-05: 2.5 mg via RESPIRATORY_TRACT
  Filled 2023-11-05: qty 3

## 2023-11-05 MED ORDER — ALBUTEROL SULFATE (2.5 MG/3ML) 0.083% IN NEBU
10.0000 mg/h | INHALATION_SOLUTION | Freq: Once | RESPIRATORY_TRACT | Status: AC
  Administered 2023-11-05: 10 mg/h via RESPIRATORY_TRACT
  Filled 2023-11-05: qty 3

## 2023-11-05 MED ORDER — IPRATROPIUM-ALBUTEROL 0.5-2.5 (3) MG/3ML IN SOLN
3.0000 mL | RESPIRATORY_TRACT | Status: DC | PRN
  Administered 2023-11-05 – 2023-11-06 (×4): 3 mL via RESPIRATORY_TRACT
  Filled 2023-11-05 (×4): qty 3

## 2023-11-05 MED ORDER — IPRATROPIUM-ALBUTEROL 0.5-2.5 (3) MG/3ML IN SOLN
3.0000 mL | Freq: Once | RESPIRATORY_TRACT | Status: AC
  Administered 2023-11-05: 3 mL via RESPIRATORY_TRACT
  Filled 2023-11-05: qty 3

## 2023-11-05 MED ORDER — SENNOSIDES-DOCUSATE SODIUM 8.6-50 MG PO TABS: 1.0000 | ORAL_TABLET | Freq: Every evening | ORAL | Status: DC | PRN

## 2023-11-05 MED ORDER — METHYLPREDNISOLONE SODIUM SUCC 125 MG IJ SOLR
125.0000 mg | Freq: Every day | INTRAMUSCULAR | Status: DC
  Administered 2023-11-05: 125 mg via INTRAVENOUS
  Filled 2023-11-05: qty 2

## 2023-11-05 MED ORDER — ALBUTEROL SULFATE HFA 108 (90 BASE) MCG/ACT IN AERS: 2.0000 | INHALATION_SPRAY | RESPIRATORY_TRACT | Status: DC | PRN

## 2023-11-05 MED ORDER — ENOXAPARIN SODIUM 60 MG/0.6ML IJ SOSY
50.0000 mg | PREFILLED_SYRINGE | INTRAMUSCULAR | Status: DC
Start: 2023-11-05 — End: 2023-11-07
  Administered 2023-11-05 – 2023-11-06 (×2): 50 mg via SUBCUTANEOUS
  Filled 2023-11-05 (×2): qty 0.6

## 2023-11-05 NOTE — ED Provider Notes (Signed)
[place] EMERGENCY DEPARTMENT AT [facility] Provider Note   CSN: [id number] Arrival date & time: 11/05/23  1145     History  No chief complaint on file.   Richard [name] is a 63 y.o. male.  Patient is a 63 year old male with a past medical history of COPD, asthma, hypertension, CAD who presents to the emergency department with a chief complaint of shortness of breath and wheezing since last night.  Patient notes that he was using his home breathing treatments with no improvement in his symptoms.  He denies increased cough or congestion.  He denies any increased edema.  He does admit to associated chest pain at this time.  He has had no associated fever, chills, dizziness, lightheadedness or syncope.  He denies any associate abdominal pain, nausea, vomiting, diarrhea.  Patient does note that he has otherwise been compliant with his home medications.  He notes that he has had no associated hemoptysis.        Home Medications Prior to Admission medications   Medication Sig Start Date End Date Taking? Authorizing Provider  albuterol (PROVENTIL) (2.5 MG/3ML) 0.083% nebulizer solution Take 3 mLs (2.5 mg total) by nebulization 2 (two) times daily. 04/18/23   [name], DO  albuterol (VENTOLIN HFA) 108 (90 Base) MCG/ACT inhaler Inhale 1-2 puffs into the lungs every 4 (four) hours as needed for wheezing or shortness of breath. 09/30/23   [name], DO  aspirin EC 81 MG EC tablet Take 1 tablet (81 mg total) by mouth daily. 09/26/15   Burns, [name], MD  atorvastatin (LIPITOR) 40 MG tablet Take 1 tablet (40 mg total) by mouth daily. 09/17/23   [name], DO  benzonatate (TESSALON) 200 MG capsule Take 1 capsule (200 mg total) by mouth 3 (three) times daily as needed for cough. 10/31/23   [name], DO  Budeson-Glycopyrrol-Formoterol (BREZTRI AEROSPHERE) 160-9-4.8 MCG/ACT AERO Inhale 2 puffs into the lungs in the morning and at bedtime. 12/05/22   [name], MD  clopidogrel (PLAVIX) 75 MG tablet Take 1 tablet (75 mg total) by mouth daily. 09/26/23   [name], DO  Continuous Glucose Sensor (FREESTYLE LIBRE 3 SENSOR) MISC Place 1 sensor on the skin every 14 days. Use to check glucose continuously 06/20/23   [name], DO  Dulaglutide (TRULICITY) 4.5 MG/0.5ML SOAJ Inject 4.5 mg as directed once a week. 10/31/23   [name], DO  Empagliflozin-metFORMIN HCl (SYNJARDY) 12.02-999 MG TABS Take 1 tablet by mouth in the morning and at bedtime. 08/15/23 02/11/24  [name], MD  escitalopram (LEXAPRO) 5 MG tablet Take 1 tablet (5 mg total) by mouth daily. 10/31/23   [name], DO  ferrous sulfate 325 (65 FE) MG tablet Take 1 tablet (325 mg total) by mouth daily. 06/19/23 06/18/24  [name], DO  fluticasone (FLONASE) 50 MCG/ACT nasal spray Place 1 spray into both nostrils daily. 03/22/23   [name], DO  insulin degludec (TRESIBA) 100 UNIT/ML FlexTouch Pen Inject 20 Units into the skin daily. 06/20/23   [name], DO  Insulin Pen Needle (PEN NEEDLES) 32G X 4 MM MISC 1 each by Does not apply route daily. 06/20/23   [name], DO  meloxicam (MOBIC) 15 MG tablet Take 1 tablet (15 mg total) by mouth daily as needed for pain. 10/14/23   [name], DO  Mepolizumab (NUCALA) 100 MG/ML SOAJ Inject 1 mL (100 mg total) into the  skin every 28 (twenty-eight) days. 08/15/23   [name], MD  montelukast (SINGULAIR) 10 MG tablet Take 1 tablet (10 mg total) by mouth at bedtime. 08/15/23   [name], MD  mupirocin ointment (BACTROBAN) 2 % Place 1 Application into the nose 2 (two) times daily. 09/21/23   Tawkaliyar, Roya, DO  polyethylene glycol powder (GLYCOLAX/MIRALAX) 17 GM/SCOOP powder Take 17 g by mouth daily. 09/21/23   Tawkaliyar, Roya, DO  pregabalin (LYRICA) 100 MG capsule Take 1 capsule (100 mg total) by mouth 3 (three) times daily. 09/26/23   [name], DO  triamcinolone ointment (KENALOG) 0.5 % Apply 1 Application  topically 2 (two) times daily. 10/31/23   [name], DO      Allergies    Shellfish allergy    Review of Systems   Review of Systems  Respiratory:  Positive for shortness of breath and wheezing.   Cardiovascular:  Positive for chest pain.  All other systems reviewed and are negative.   Physical Exam Updated Vital Signs BP 138/65 (BP Location: Right Arm)   Pulse (!) 103   Temp 98 F (36.7 C) (Oral)   Resp 18   SpO2 96%  Physical Exam Constitutional:      Appearance: Normal appearance.  HENT:     Head: Normocephalic and atraumatic.     Nose: Nose normal.     Mouth/Throat:     Mouth: Mucous membranes are moist.  Eyes:     Extraocular Movements: Extraocular movements intact.     Conjunctiva/sclera: Conjunctivae normal.     Pupils: Pupils are equal, round, and reactive to light.  Cardiovascular:     Rate and Rhythm: Normal rate and regular rhythm.     Pulses: Normal pulses.     Heart sounds: Normal heart sounds.  Pulmonary:     Comments: Diffuse expiratory wheezing with tachypnea Chest:     Chest wall: No tenderness.  Abdominal:     General: Abdomen is flat. Bowel sounds are normal. There is no distension.     Palpations: Abdomen is soft.     Tenderness: There is no abdominal tenderness. There is no guarding.  Musculoskeletal:        General: No swelling or tenderness. Normal range of motion.     Cervical back: Normal range of motion and neck supple.  Skin:    General: Skin is warm and dry.  Neurological:     General: No focal deficit present.     Mental Status: He is alert and oriented to person, place, and time. Mental status is at baseline.  Psychiatric:        Mood and Affect: Mood normal.        Behavior: Behavior normal.        Thought Content: Thought content normal.        Judgment: Judgment normal.     ED Results / Procedures / Treatments   Labs (all labs ordered are listed, but only abnormal results are displayed) Labs Reviewed  RESP PANEL BY  RT-PCR (RSV, FLU A&B, COVID)  [id number]  COMPREHENSIVE METABOLIC PANEL  CBC WITH DIFFERENTIAL/PLATELET  TROPONIN I (HIGH SENSITIVITY)    EKG None  Radiology No results found.  Procedures Procedures    Medications Ordered in ED Medications  albuterol (VENTOLIN HFA) 108 (90 Base) MCG/ACT inhaler 2 puff (has no administration in time range)  methylPREDNISolone sodium succinate (SOLU-MEDROL) 125 mg/2 mL injection 125 mg (125 mg Intravenous Given 11/05/23 1207)  ipratropium-albuterol (DUONEB) 0.5-2.5 (3)  MG/3ML nebulizer solution 3 mL (has no administration in time range)  ipratropium-albuterol (DUONEB) 0.5-2.5 (3) MG/3ML nebulizer solution 3 mL (has no administration in time range)  albuterol (PROVENTIL) (2.5 MG/3ML) 0.083% nebulizer solution (10 mg/hr Nebulization Given 11/05/23 1205)    ED Course/ Medical Decision Making/ A&P Clinical Course as of 11/05/23 1427  Tue Nov 05, 2023  1303 DG Chest Portable 1 View [CR]  1340 DG Chest Portable 1 View [CR]    Clinical Course User Index [CR] Lelon Perla, [place]                                 Medical Decision Making Patient does continue to have ongoing wheezing and tachypnea despite multiple breathing treatments, Solu-Medrol, magnesium in the emergency department.  Patient does have stable vital signs at this point and no associated hypoxia.  EKG demonstrated no acute ischemic changes and troponin was within normal limits.  Do not suspect ACS at this point.  Given patient's clinical presentation do not suspect this is from underlying etiology such as pulmonary embolus.  Do not suspect do not suspect pericarditis or myocarditis as symptoms were not positional in nature.  Patient has no clinical indication for fluid overload and do not suspect CHF exacerbation.  Chest x-ray demonstrated no signs of acute consolidation or fluid overload.  Patient has been fully evaluated by attending physician who is in agreement to plan at this time for  admission to the hospitalist service.  Patient case has been discussed with the internal medicine resident service who has happily excepted at this time.  Amount and/or Complexity of Data Reviewed External Data Reviewed: labs, radiology and ECG. Labs: ordered. Radiology: ordered. Decision-making details documented in ED Course.  Risk Prescription drug management. Decision regarding hospitalization.           Final Clinical Impression(s) / ED Diagnoses Final diagnoses:  None    Rx / DC Orders ED Discharge Orders     None         Kathlen Mody 11/05/23 1437    Benjiman Core, MD 11/05/23 [phone]

## 2023-11-05 NOTE — ED Provider Triage Note (Signed)
Emergency Medicine Provider Triage Evaluation Note  Richard [name] , a 63 y.o. male  was evaluated in triage.  Pt complains of dyspnea since last night with wheezing. Notes that he did take home breathing treatments last night and this morning with no improvement. Admits to chest pain.   Review of Systems  Positive: Chest pain, dyspnea Negative: Edema, abd pain, N/V/D  Physical Exam  BP 138/65 (BP Location: Right Arm)   Pulse (!) 103   Temp 98 F (36.7 C) (Oral)   Resp 18   SpO2 96%  Gen:   Awake, respiratory distress Resp:  Tachypnea, difffuse wheezing MSK:   Moves extremities without difficulty  Other:  No LE edema  Medical Decision Making  Medically screening exam initiated at 11:57 AM.  Appropriate orders placed.  Richard [name] was informed that the remainder of the evaluation will be completed by another provider, this initial triage assessment does not replace that evaluation, and the importance of remaining in the ED until their evaluation is complete.  Workup initiated to include BW, imaging, solumedrol, nebs   Richard Perla, PA-C 11/05/23 1204
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
## 2023-11-05 NOTE — Progress Notes (Addendum)
Peak flow performed 200 L/P 37% with good patient effort

## 2023-11-05 NOTE — ED Triage Notes (Addendum)
It having cp and sob since last night; using inhalers at home without relief; inspiratory wheezing in triage, sob

## 2023-11-06 ENCOUNTER — Telehealth: Payer: Self-pay | Admitting: Podiatry

## 2023-11-06 DIAGNOSIS — I2510 Atherosclerotic heart disease of native coronary artery without angina pectoris: Secondary | ICD-10-CM

## 2023-11-06 DIAGNOSIS — Z794 Long term (current) use of insulin: Secondary | ICD-10-CM | POA: Diagnosis not present

## 2023-11-06 DIAGNOSIS — J449 Chronic obstructive pulmonary disease, unspecified: Secondary | ICD-10-CM | POA: Diagnosis not present

## 2023-11-06 DIAGNOSIS — E1151 Type 2 diabetes mellitus with diabetic peripheral angiopathy without gangrene: Secondary | ICD-10-CM | POA: Diagnosis not present

## 2023-11-06 DIAGNOSIS — M940 Chondrocostal junction syndrome [Tietze]: Secondary | ICD-10-CM | POA: Insufficient documentation

## 2023-11-06 LAB — CBC
HCT: 37.8 % — ABNORMAL LOW (ref 39.0–52.0)
Hemoglobin: 11.9 g/dL — ABNORMAL LOW (ref 13.0–17.0)
MCH: 25.5 pg — ABNORMAL LOW (ref 26.0–34.0)
MCHC: 31.5 g/dL (ref 30.0–36.0)
MCV: 80.9 fL (ref 80.0–100.0)
Platelets: 313 10*3/uL (ref 150–400)
RBC: 4.67 MIL/uL (ref 4.22–5.81)
RDW: 15.4 % (ref 11.5–15.5)
WBC: 7.4 10*3/uL (ref 4.0–10.5)
nRBC: 0 % (ref 0.0–0.2)

## 2023-11-06 LAB — BASIC METABOLIC PANEL
Anion gap: 11 (ref 5–15)
BUN: 21 mg/dL (ref 8–23)
CO2: 20 mmol/L — ABNORMAL LOW (ref 22–32)
Calcium: 8.6 mg/dL — ABNORMAL LOW (ref 8.9–10.3)
Chloride: 99 mmol/L (ref 98–111)
Creatinine, Ser: 1.15 mg/dL (ref 0.61–1.24)
GFR, Estimated: >60 mL/min (ref >60)
Glucose, Bld: 194 mg/dL — ABNORMAL HIGH (ref 70–99)
Potassium: 4.6 mmol/L (ref 3.5–5.1)
Sodium: 135 mmol/L (ref 135–145)

## 2023-11-06 LAB — CBG MONITORING, ED
Glucose-Capillary: 153 mg/dL — ABNORMAL HIGH (ref 70–99)
Glucose-Capillary: 275 mg/dL — ABNORMAL HIGH (ref 70–99)

## 2023-11-06 LAB — GLUCOSE, CAPILLARY
Glucose-Capillary: 182 mg/dL — ABNORMAL HIGH (ref 70–99)
Glucose-Capillary: 188 mg/dL — ABNORMAL HIGH (ref 70–99)

## 2023-11-06 MED ORDER — INSULIN GLARGINE-YFGN 100 UNIT/ML ~~LOC~~ SOLN
16.0000 [IU] | Freq: Every day | SUBCUTANEOUS | Status: DC
  Administered 2023-11-06: 16 [IU] via SUBCUTANEOUS
  Filled 2023-11-06 (×2): qty 0.16

## 2023-11-06 MED ORDER — TRIAMCINOLONE ACETONIDE 0.5 % EX OINT
TOPICAL_OINTMENT | Freq: Two times a day (BID) | CUTANEOUS | Status: DC
  Filled 2023-11-06: qty 15

## 2023-11-06 MED ORDER — IPRATROPIUM-ALBUTEROL 0.5-2.5 (3) MG/3ML IN SOLN
3.0000 mL | Freq: Four times a day (QID) | RESPIRATORY_TRACT | Status: DC
  Administered 2023-11-06 – 2023-11-07 (×3): 3 mL via RESPIRATORY_TRACT
  Filled 2023-11-06 (×2): qty 3

## 2023-11-06 MED ORDER — ALBUTEROL SULFATE (2.5 MG/3ML) 0.083% IN NEBU: 2.5000 mg | INHALATION_SOLUTION | RESPIRATORY_TRACT | Status: DC | PRN

## 2023-11-06 MED ORDER — MOMETASONE FURO-FORMOTEROL FUM 100-5 MCG/ACT IN AERO
2.0000 | INHALATION_SPRAY | Freq: Two times a day (BID) | RESPIRATORY_TRACT | Status: DC
  Filled 2023-11-06: qty 8.8

## 2023-11-06 MED ORDER — DEXTROMETHORPHAN POLISTIREX ER 30 MG/5ML PO SUER
15.0000 mg | Freq: Every evening | ORAL | Status: DC | PRN
  Administered 2023-11-06: 15 mg via ORAL
  Filled 2023-11-06: qty 5

## 2023-11-06 NOTE — Progress Notes (Signed)
Transition of Care [facility]) - Inpatient Brief Assessment   Patient Details  Name: Richard [name] MRN: [id number] Date of Birth: 1960-10-15  Transition of Care [facility]) CM/SW Contact:    Janae Bridgeman, RN Phone Number: 11/06/2023, 2:15 PM   Clinical Narrative: Cm met with the patient at the bedside to discuss TOC needs.  Patient admitted for COPD/asthma and plans to return home when medically stable for discharge.  Patient is currently on room air.  Patient has left foot wound - CAM walker in the room.  Patient has follow up visit with [place] wound care clinic on 11/13/23 for follow up woundcare needs.  Patient was provided with [facility] letter.  DME at the home includes RW, WC, Cane.  Patient plans to return home when medically stable for discharge.   Transition of Care Asessment: Insurance and Status: (P) Insurance coverage has been reviewed Patient has primary care physician: (P) Yes Home environment has been reviewed: (P) from home alone Prior level of function:: (P) Independent Prior/Current Home Services: (P) No current home services Social Drivers of Health Review: (P) SDOH reviewed interventions complete Readmission risk has been reviewed: (P) Yes Transition of care needs: (P) no transition of care needs at this time

## 2023-11-06 NOTE — Progress Notes (Addendum)
                  Subjective:   Summary: Patient is a 63 year old male with history of COPD, asthma, hypertension, CAD, aortic stenosis, presenting with shortness of breath and chest pain, admitted for COPD exacerbation.   Patient feels the same today. Continues to have wheezing and chest pain, no significant change in breathing. He is very concerned about his breathing, informed it is most likely MSK/costochondritis in nature  Objective:  Vital signs in last 24 hours: Vitals:   11/06/23 0930 11/06/23 1100 11/06/23 1300 11/06/23 1309  BP: 132/77 (!) 145/83 125/78   Pulse: 95 82 97   Resp: (!) 21 17    Temp:    98.2 F (36.8 C)  TempSrc:    Oral  SpO2: 97% 100% 97%   Weight:      Height:       Supplemental O2: Room Air SpO2: 97 % FiO2 (%): 30 %  Physical Exam:  Constitutional:  in no acute distress Cardiovascular: RRR, systolic murmur Pulmonary/Chest: on room air, breathing with some exertion, improving expiratory wheezes. Decreased air movement bilaterally. Anterior chest wall tender to palpation Abdominal: soft, non-tender, non-distended Neuro: 5/5 strength, no focal deficit noted  Assessment/Plan:   COPD exacerbation Asthma Continues to have dyspnea and wheezing, still not requiring supplemental oxygen. Patient reports little improvement in his symptoms. As patient is still very symptomatic we will continue his treatments inpatient, hopefully his respiratory inflammation will improve and he can be discharged home soon.  - Home LAMA/LABA/ICS, Singulair - Prednisone 40mg  day 2/5 - Duonebs q4h prn  - O2 sat goal 88-92%  Atypical / non cardiac chest pain Suspect Costochondritis  Continues to endorse chest pain, not improved since yesterday. Chest is tender to palpation, lidocaine patch in place. Patient feels his pain is worsened with deep breathing. With unchanged EKG with no ischemic changes, flat troponins <10, very low concern for cardiac etiology of chest pain even  with history of aortic stenosis. I think his pain will best be treated with conservative therapy and with resolution of his COPD exacerbation.  - Tylenol, lidocaine patch  CAD: home ASA, Plavix, statin T2DM, neuropathy: home empagliflozin-metformin and Lyrica, SSI S/p transmetatarsal amputation: oxycodone 5mg  q4h prn  Diet: Heart healthy/carb modified VTE: Lovenox Code: Full  Dispo: Anticipated discharge to Home pending medical stability.   [name], MD PGY-1 Internal Medicine Resident Pager Number [phone] Please contact the on call pager after 5 pm and on weekends at [phone].

## 2023-11-06 NOTE — Evaluation (Signed)
Physical Therapy Evaluation and Discharge Patient Details Name: Maclane Holloran MRN: [id number] DOB: [birth year] Today's Date: 11/06/2023  History of Present Illness  Pt is a 63 y.o. presenting with shortness of breath, chest pain, and wheezing. Significant PMH: COPD, DM2, HTN,HLD, CAD, PAD, neuropathy, diabetic foot ulcer s/p left fourth and fifth toe amputations.  Clinical Impression   Patient evaluated by Physical Therapy with no further acute PT needs identified. Pt modified independent with use of RW with sats 94-98% on RA with ambulation.  PT is signing off. Thank you for this referral.         If plan is discharge home, recommend the following: Help with stairs or ramp for entrance   Can travel by private vehicle        Equipment Recommendations None recommended by PT  Recommendations for Other Services       Functional Status Assessment Patient has not had a recent decline in their functional status     Precautions / Restrictions Precautions Precautions: Other (comment) Precaution Comments: wear cam boot Required Braces or Orthoses: Other Brace Other Brace: camboot LLE Restrictions Weight Bearing Restrictions Per Provider Order: No Other Position/Activity Restrictions: per pt, to minimize weight on his left foot      Mobility  Bed Mobility Overal bed mobility: Independent                  Transfers Overall transfer level: Modified independent Equipment used: Rolling walker (2 wheels)                    Ambulation/Gait Ambulation/Gait assistance: Supervision Gait Distance (Feet): 70 Feet Assistive device: Rolling walker (2 wheels) Gait Pattern/deviations: Step-to pattern   Gait velocity interpretation: 1.31 - 2.62 ft/sec, indicative of limited community ambulator   General Gait Details: no cues needed  Stairs Stairs:  (unable to assess in ED)          Wheelchair Mobility     Tilt Bed    Modified Rankin (Stroke Patients Only)        Balance Overall balance assessment: Modified Independent                                           Pertinent Vitals/Pain Pain Assessment Pain Assessment: No/denies pain    Home Living Family/patient expects to be discharged to:: Private residence Living Arrangements: Alone Available Help at Discharge: Family;Available PRN/intermittently Type of Home: House Home Access: Stairs to enter Entrance Stairs-Rails: None (at back) Entrance Stairs-Number of Steps: 1 at back and 3 at front Alternate Level Stairs-Number of Steps: flight Home Layout: Two level;1/2 bath on main level;Bed/bath upstairs Home Equipment: Cane - single point;BSC/3in1;Grab bars - tub/shower;Wheelchair - manual;Grab bars - toilet;Hand held Stage manager (4 wheels)      Prior Function Prior Level of Function : Needs assist             Mobility Comments: son helps him up the steps ADLs Comments: son does the cooking, otherwise Ind with b/d     Extremity/Trunk Assessment   Upper Extremity Assessment Upper Extremity Assessment: Overall WFL for tasks assessed    Lower Extremity Assessment Lower Extremity Assessment: Overall WFL for tasks assessed    Cervical / Trunk Assessment Cervical / Trunk Assessment: Normal  Communication   Communication Communication: No apparent difficulties Cueing Techniques: Verbal cues  Cognition Arousal: Alert Behavior During Therapy: [facility]  for tasks assessed/performed Overall Cognitive Status: Within Functional Limits for tasks assessed                                          General Comments General comments (skin integrity, edema, etc.): sats on RA 94% at rest; 98% with ambulation    Exercises     Assessment/Plan    PT Assessment Patient does not need any further PT services  PT Problem List         PT Treatment Interventions      PT Goals (Current goals can be found in the Care Plan section)  Acute Rehab PT  Goals PT Goal Formulation: All assessment and education complete, DC therapy    Frequency       Co-evaluation               AM-PAC PT "6 Clicks" Mobility  Outcome Measure Help needed turning from your back to your side while in a flat bed without using bedrails?: None Help needed moving from lying on your back to sitting on the side of a flat bed without using bedrails?: None Help needed moving to and from a bed to a chair (including a wheelchair)?: None Help needed standing up from a chair using your arms (e.g., wheelchair or bedside chair)?: None Help needed to walk in hospital room?: None Help needed climbing 3-5 steps with a railing? : None 6 Click Score: 24    End of Session   Activity Tolerance: Patient tolerated treatment well Patient left: in bed;with call bell/phone within reach Nurse Communication: Mobility status;Other (comment) (sats with mobility) PT Visit Diagnosis: Other abnormalities of gait and mobility (R26.89)    Time: 2130-8657 PT Time Calculation (min) (ACUTE ONLY): 16 min   Charges:   PT Evaluation $PT Eval Low Complexity: 1 Low   PT General Charges $$ ACUTE PT VISIT: 1 Visit          Jerolyn Center, PT Acute Rehabilitation Services  Office [phone]   Zena Amos 11/06/2023, 10:16 AM

## 2023-11-06 NOTE — Plan of Care (Signed)

## 2023-11-06 NOTE — Care Management Obs Status (Cosign Needed)
MEDICARE OBSERVATION STATUS NOTIFICATION   Patient Details  Name: Richard [name] MRN: [id number] Date of Birth: 12-Dec-1960   Medicare Observation Status Notification Given:  Yes    Janae Bridgeman, RN 11/06/2023, 2:13 PM

## 2023-11-06 NOTE — Telephone Encounter (Signed)
Pt cxled appt for tomorrow 1/30 because he is in the hospital. He asked about the bandage being changed since that is what he was coming in for.I told him to talk with the nurse and hospitalist and tell them to consult Dr Annamary Rummage

## 2023-11-06 NOTE — Progress Notes (Signed)
SATURATION QUALIFICATIONS: (This note is used to comply with regulatory documentation for home oxygen)  Patient Saturations on Room Air at Rest = 94%  Patient Saturations on Room Air while Ambulating = 98%    Jerolyn Center, PT Acute Rehabilitation Services  Office [phone]

## 2023-11-07 ENCOUNTER — Other Ambulatory Visit (HOSPITAL_COMMUNITY): Payer: Self-pay

## 2023-11-07 ENCOUNTER — Encounter: Payer: 59 | Admitting: Podiatry

## 2023-11-07 DIAGNOSIS — E1151 Type 2 diabetes mellitus with diabetic peripheral angiopathy without gangrene: Secondary | ICD-10-CM | POA: Diagnosis not present

## 2023-11-07 DIAGNOSIS — I2510 Atherosclerotic heart disease of native coronary artery without angina pectoris: Secondary | ICD-10-CM | POA: Diagnosis not present

## 2023-11-07 DIAGNOSIS — Z794 Long term (current) use of insulin: Secondary | ICD-10-CM | POA: Diagnosis not present

## 2023-11-07 DIAGNOSIS — J449 Chronic obstructive pulmonary disease, unspecified: Secondary | ICD-10-CM | POA: Diagnosis not present

## 2023-11-07 LAB — GLUCOSE, CAPILLARY: Glucose-Capillary: 239 mg/dL — ABNORMAL HIGH (ref 70–99)

## 2023-11-07 MED ORDER — PREDNISONE 20 MG PO TABS
40.0000 mg | ORAL_TABLET | Freq: Every day | ORAL | 0 refills | Status: DC
  Filled 2023-11-07: qty 4, 2d supply, fill #0

## 2023-11-07 MED ORDER — IPRATROPIUM-ALBUTEROL 0.5-2.5 (3) MG/3ML IN SOLN
3.0000 mL | Freq: Four times a day (QID) | RESPIRATORY_TRACT | 0 refills | Status: DC | PRN
  Filled 2023-11-07: qty 180, 15d supply, fill #0

## 2023-11-07 NOTE — Progress Notes (Signed)
Mobility Specialist: Progress Note   11/07/23 1112  Mobility  Activity Ambulated independently in hallway  Level of Assistance Independent  Assistive Device None  Distance Ambulated (ft) 400 ft  Activity Response Tolerated well  Mobility Referral Yes  Mobility visit 1 Mobility  Mobility Specialist Start Time (ACUTE ONLY) 0901  Mobility Specialist Stop Time (ACUTE ONLY) 0912  Mobility Specialist Time Calculation (min) (ACUTE ONLY) 11 min    Received pt on EOB having no complaints and agreeable to mobility. Donn/doffed CAM boot ind. Pt was asymptomatic throughout ambulation and returned to room w/o fault. Left on EOB w/ call bell in reach and all needs met.   Maurene Capes Mobility Specialist Please contact via SecureChat or Rehab office at [phone]

## 2023-11-07 NOTE — Discharge Instructions (Addendum)
Mr Richard, [name] were hospitalized for a COPD exacerbation. Your breathing is much better, and I feel comfortable discharging you with outpatient follow up. Thank you for allowing [place] to be part of your care.   We arranged for you to follow up at: [facility] on Feb 6 at 9:15am  Please note these changes made to your medications:   *Please START taking:  Prednisone 40mg  (two tablets) for two more days starting tomorrow Duonebs every 6 hours as needed for shortness of breath/wheezing  Please make sure to return to the hospital if you have worsening shortness of breath, fever, or dizziness.   Please call our clinic if you have any questions or concerns, we may be able to help and keep you from a long and expensive emergency room wait. Our clinic and after hours phone number is [phone], the best time to call is Monday through Friday 9 am to 4 pm but there is always someone available 24/7 if you have an emergency. If you need medication refills please notify your pharmacy one week in advance and they will send [place] a request.

## 2023-11-07 NOTE — Discharge Summary (Addendum)
Name: Richard [name] MRN: [id number] DOB: 1960/10/14 63 y.o. PCP: [name], DO  Date of Admission: 11/05/2023 11:52 AM Date of Discharge: 11/07/23 Attending Physician: Dr. 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 1-2 times daily   albuterol (2.5 MG/3ML) 0.083% nebulizer solution Commonly known as: PROVENTIL Take 3 mLs (2.5 mg total) by nebulization 2 (two) times daily.   albuterol 108 (90 Base) MCG/ACT inhaler Commonly known as: VENTOLIN HFA Inhale 1-2 puffs into the lungs every 4 (four) hours as needed for wheezing or shortness of breath.   aspirin EC 81 MG tablet Take 1 tablet (81 mg total) by mouth daily.   atorvastatin 40 MG tablet Commonly known as: LIPITOR Take 1 tablet (40 mg total) by mouth daily.   benzonatate 200 MG capsule Commonly known as: TESSALON Take 1 capsule (200 mg total) by mouth 3 (three) times daily as needed for cough.   Breztri Aerosphere 160-9-4.8 MCG/ACT Aero Generic drug: Budeson-Glycopyrrol-Formoterol Inhale 2 puffs into the lungs in the morning and at bedtime.   clopidogrel 75 MG tablet Commonly known as: PLAVIX Take 1 tablet (75 mg total) by mouth daily.   escitalopram 5 MG tablet Commonly known as: Lexapro Take 1 tablet (5 mg total) by mouth daily.   ferrous sulfate 325 (65 FE) MG tablet Take 1 tablet (325 mg total) by mouth daily.   fluticasone 50  MCG/ACT nasal spray Commonly known as: FLONASE Place 1 spray into both nostrils daily. What changed:  when to take this reasons to take this   FreeStyle Libre 3 Sensor Misc Place 1 sensor on the skin every 14 days. Use to check glucose continuously   glucose blood test strip 1 each by Other route in the morning and at bedtime.   insulin degludec 100 UNIT/ML FlexTouch Pen Commonly known as: TRESIBA Inject 20 Units into the skin daily.   ipratropium-albuterol 0.5-2.5 (3) MG/3ML Soln Commonly known as: DUONEB Take 3 mLs by nebulization every 6 (six) hours as needed.   meloxicam 15 MG tablet Commonly known as: MOBIC Take 1 tablet (15 mg total) by mouth daily as needed for pain.   montelukast 10 MG tablet Commonly known as: SINGULAIR Take 1 tablet (10 mg total) by mouth at bedtime.   mupirocin ointment 2 % Commonly known as: BACTROBAN Place 1 Application into the nose 2 (two) times daily.   Nucala 100 MG/ML Soaj Generic drug: Mepolizumab Inject 1 mL (100 mg total) into the skin every 28 (twenty-eight) days.   Pen Needles 32G X 4 MM Misc 1 each by Does not apply route daily.   polyethylene glycol powder 17 GM/SCOOP powder Commonly known as: GLYCOLAX/MIRALAX Take 17 g by mouth daily. What changed:  when to take this reasons to take this   predniSONE 20 MG tablet Commonly known as: DELTASONE Take 2 tablets (40 mg total) by  mouth daily with breakfast. Start taking on: November 08, 2023   pregabalin 100 MG capsule Commonly known as: LYRICA Take 1 capsule (100 mg total) by mouth 3 (three) times daily.   Synjardy 12.02-999 MG Tabs Generic drug: Empagliflozin-metFORMIN HCl Take 1 tablet by mouth in the morning and at bedtime.   triamcinolone ointment 0.5 % Commonly known as: KENALOG Apply 1 Application topically 2 (two) times daily.   Trulicity 4.5 MG/0.5ML Soaj Generic drug: Dulaglutide Inject 4.5 mg as directed once a week.        Disposition and follow-up:    [name] was discharged from [facility] in Stable condition.  At the hospital follow up visit please address:  1.  Follow-up:  COPD/asthma overlap syndrome: check if patient is at his baseline respiratory status    Transmetatarsal amputation: required prn oxycodone for post-op pain. Assess pain and need for further analgesics.    2.  Labs / imaging needed at time of follow-up: none  3.  Pending labs/ test needing follow-up: none  4.  Medication Changes  Started: Prednisone 40mg  for 2 doses, ending 11/09/23, Duoneb q6h prn  Follow-up Appointments: [facility], Feb 6 at 9:15am with Dr [facility] Course by problem list:   COPD exacerbation Asthma Patient presented with approximately a week of shortness of breath, wheezing, and chest pain. His symptoms began as multiple others at home also developed URI symptoms. On arrival to the ED he was not requiring supplemental oxygen but had severe wheezing and chest tightness. He was started on a 5 day course of steroids and LAMA/LABA/ICS inhaler with scheduled and prn duonebs. By hospital day 2 his symptoms were greatly improved and he was discharged home with two more days of prednisone and PRN duonebs.   Atypical/noncardiac chest pain Possible costochondritis Patient has a history of intermittent episodes of atypical chest pain for the past several months. He is followed by cardiology for CAD and aortic stenosis. His EKG did not show ischemic changes and troponins were <10 and flat. Chest pain was likely due to MSK vs costochronditis. Pain managed with Tylenol and lidocaine patch. Greatly improved by day of discharge.   CAD Home ASA, Plavix, and statin were continued during admission  T2DM Home empagliflozin and metformin were continued during admission.   S/p transmetatarsal amputation Oxycodone 5mg  q6h prn was used to control pain from transmetatarsal amputation last month. WOC was consulted who replaced  his dressing as regularly scheduled.    Discharge Subjective: Patient breathing is greatly improved today. Still having mild intermittent chest pain but much better as his breathing has improved. He is concerned his breathing will get worse once he gets home, but felt better about discharge today as we discussed continuing prednisone and duonebs at home.   Discharge Exam:   BP 123/69 (BP Location: Left Arm)   Pulse 95   Temp 98 F (36.7 C) (Oral)   Resp 16   Ht 6' (1.829 m)   Wt 104.8 kg   SpO2 91%   BMI 31.33 kg/m  Constitutional: well-appearing, in no acute distress Cardiovascular: regular rate and rhythm, no m/r/g Pulmonary/Chest: normal work of breathing on room air, bilateral lung fields with some wheezes and mildly decreased air movement.  Abdominal: soft, non-tender, non-distended Neurological: alert & oriented x 3, 5/5 strength in bilateral upper and lower extremities Skin: L foot with new clean and dry dressing  Pertinent Labs, Studies, and Procedures:     Latest Ref Rng &  Units 11/06/2023    4:09 AM 11/05/2023   12:04 PM 10/09/2023    4:51 PM  CBC  WBC 4.0 - 10.5 K/uL 7.4  5.3  4.1   Hemoglobin 13.0 - 17.0 g/dL 16.1  09.6  04.5   Hematocrit 39.0 - 52.0 % 37.8  41.2  38.9   Platelets 150 - 400 K/uL 313  317  304        Latest Ref Rng & Units 11/06/2023    4:09 AM 11/05/2023   12:04 PM 10/31/2023    9:44 AM  CMP  Glucose 70 - 99 mg/dL 409  811  914   BUN 8 - 23 mg/dL 21  12  18    Creatinine 0.61 - 1.24 mg/dL 7.82  9.56  2.13   Sodium 135 - 145 mmol/L 135  140  137   Potassium 3.5 - 5.1 mmol/L 4.6  4.9  5.4   Chloride 98 - 111 mmol/L 99  102  100   CO2 22 - 32 mmol/L 20  23  22    Calcium 8.9 - 10.3 mg/dL 8.6  9.1  9.7   Total Protein 6.5 - 8.1 g/dL  7.3    Total Bilirubin 0.0 - 1.2 mg/dL  0.6    Alkaline Phos 38 - 126 U/L  99    AST 15 - 41 U/L  24    ALT 0 - 44 U/L  27      DG Chest Portable 1 View Result Date: 11/05/2023 CLINICAL DATA:  Dyspnea.  Shortness  of breath.  Chest pain. EXAM: PORTABLE CHEST 1 VIEW COMPARISON:  09/04/2023 FINDINGS: The lungs appear clear. Cardiac and mediastinal contours normal. No blunting of the costophrenic angles. No significant bony findings. IMPRESSION: No active cardiopulmonary disease is radiographically apparent. Electronically Signed   By: Gaylyn Rong M.D.   On: 11/05/2023 13:30    Signed: [name], MD PGY-1 11/07/2023, 9:55 AM   Pager: [phone]

## 2023-11-07 NOTE — Consult Note (Signed)
WOC Nurse Consult Note: patient had L transmetatarsal amputation by Dr. Annamary Rummage 09/18/2023; followed last in his office 10/29/2023 with small amount of incisional wound dehiscence noted (see photo in media) he placed Xeroform on the wound and asked patient to follow-up in a week; patient also has a wound care center appointment set for 11/13/2023 Reason for Consult: dressing change L foot  Wound type: full thickness incisional wound dehiscence  Pressure Injury POA: NA  Measurement: 10 cm long incision line with 3 cm x 0.5 cm x 0.2 cm area that was previously dehisced, now 100% brown eschar  Wound bed: brown eschar  Drainage (amount, consistency, odor) none  Periwound: intact incision line, dry flaky skin to foot  Dressing procedure/placement/frequency: Apply Xeroform gauze to incision line weekly, cover with dry gauze and Kerlix roll gauze to secure and protect.  Place Ace bandage over dressing to secure.    Per Dr. Charna Elizabeth note: WB Status: weightbearing to the left foot in cam boot though I recommended reduced weightbearing is much as possible   Patient is very atune to his foot and his wound care, he feels comfortable to continue once weekly dressing changes as foot looks improved from last week when seen by Dr. Annamary Rummage.  Patient to keep follow-up appointments with Dr. Annamary Rummage and wound care center as already scheduled.   POC discussed with bedside nurse. WOC nurse did perform above wound care at time of visit.  WOC team will not follow. Re-consult if further needs arise.   Thank you,   Priscella Mann MSN, RN-BC, [employer] [phone]

## 2023-11-08 ENCOUNTER — Telehealth: Payer: Self-pay

## 2023-11-08 NOTE — Transitions of Care (Post Inpatient/ED Visit) (Signed)
11/08/2023  Name: Richard [name] MRN: [id number] DOB: 12-04-1960  Today's TOC FU Call Status: Today's TOC FU Call Status:: Successful TOC FU Call Completed TOC FU Call Complete Date: 11/08/23 Patient's Name and Date of Birth confirmed.  Transition Care Management Follow-up Telephone Call Date of Discharge: 11/07/23 Discharge Facility: [facility]) Type of Discharge: Emergency Department Reason for ED Visit: Respiratory Respiratory Diagnosis: COPD Exacerbation How have you been since you were released from the hospital?: Better Any questions or concerns?: No  Items Reviewed: Did you receive and understand the discharge instructions provided?: Yes Medications obtained,verified, and reconciled?: Yes (Medications Reviewed) Any new allergies since your discharge?: No Dietary orders reviewed?: Yes Do you have support at home?: Yes People in Home: child(ren), adult  Medications Reviewed Today: Medications Reviewed Today     Reviewed by Karena Addison, LPN (Licensed Practical Nurse) on 11/08/23 at 1020  Med List Status: <None>   Medication Order Taking? Sig Documenting Provider Last Dose Status Informant  Accu-Chek Softclix Lancets lancets [id number]  1 each by Other route See admin instructions. 1-2 times daily [provider]  Active Self, Pharmacy Records  albuterol (PROVENTIL) (2.5 MG/3ML) 0.083% nebulizer solution [id number] No Take 3 mLs (2.5 mg total) by nebulization 2 (two) times daily. [name], DO 11/05/2023 Active Self, Pharmacy Records  albuterol (VENTOLIN HFA) 108 (90 Base) MCG/ACT inhaler [id number] No Inhale 1-2 puffs into the lungs every 4 (four) hours as needed for wheezing or shortness of breath. [name], DO Taking Active Self, Pharmacy Records  aspirin EC 81 MG EC tablet [id number] No Take 1 tablet (81 mg total) by mouth daily. [name], MD 11/05/2023 Morning Active Self, Pharmacy Records  atorvastatin (LIPITOR) 40 MG tablet [id number] No  Take 1 tablet (40 mg total) by mouth daily. [name], DO 11/04/2023 Active Self, Pharmacy Records  benzonatate (TESSALON) 200 MG capsule [id number] No Take 1 capsule (200 mg total) by mouth 3 (three) times daily as needed for cough. [name], DO Past Week Active Self, Pharmacy Records  Budeson-Glycopyrrol-Formoterol (BREZTRI AEROSPHERE) 160-9-4.8 MCG/ACT AERO [id number] No Inhale 2 puffs into the lungs in the morning and at bedtime. [name], MD 11/05/2023 Morning Active Self, Pharmacy Records  clopidogrel (PLAVIX) 75 MG tablet [id number] No Take 1 tablet (75 mg total) by mouth daily. [name], DO 11/05/2023 Morning Active Self, Pharmacy Records  Continuous Glucose Sensor (FREESTYLE LIBRE 3 SENSOR) [place] [id number] No Place 1 sensor on the skin every 14 days. Use to check glucose continuously [name], DO Taking Active Self, Pharmacy Records  Dulaglutide (TRULICITY) 4.5 MG/0.5ML [place] [id number] No Inject 4.5 mg as directed once a week. [name], DO Past Week Active Self, Pharmacy Records  Empagliflozin-metFORMIN HCl [facility]) 12.02-999 MG TABS [id number] No Take 1 tablet by mouth in the morning and at bedtime. [name], MD 11/05/2023 Morning Active Self, Pharmacy Records  escitalopram (LEXAPRO) 5 MG tablet [id number]  Take 1 tablet (5 mg total) by mouth daily. [name], DO  Active Self, Pharmacy Records           Med Note [facility], [place] A   Tue Nov 05, 2023  3:39 PM) Pt takes prn  ferrous sulfate 325 (65 FE) MG tablet [id number] No Take 1 tablet (325 mg total) by mouth daily. [name], DO 11/05/2023 Active Self, Pharmacy Records  fluticasone [facility]) 50 MCG/ACT nasal spray [id number] No Place 1 spray into both nostrils daily.  Patient taking  differently: Place 1 spray into both nostrils as needed for allergies.   [name], DO Taking Active Self, Pharmacy Records           Med Note [facility] [place], [place] A   Tue Nov 05, 2023  2:22 PM)     glucose blood test strip [id number]  1 each by Other route in the morning and at bedtime. [provider]  Active Self, Pharmacy Records  insulin degludec (TRESIBA) 100 UNIT/ML FlexTouch Pen [id number] No Inject 20 Units into the skin daily. [name], DO 11/04/2023 Active Self, Pharmacy Records  Insulin Pen Needle (PEN NEEDLES) 32G X 4 MM MISC [id number] No 1 each by Does not apply route daily. [name], DO Taking Active Self, Pharmacy Records  ipratropium-albuterol (DUONEB) 0.5-2.5 (3) MG/3ML SOLN [id number]  Take 3 mLs by nebulization every 6 (six) hours as needed. [name], MD  Active   meloxicam [facility]) 15 MG tablet [id number] No Take 1 tablet (15 mg total) by mouth daily as needed for pain. [name], DO 11/05/2023 Active Self, Pharmacy Records  Mepolizumab (NUCALA) 100 MG/ML SOAJ [id number] No Inject 1 mL (100 mg total) into the skin every 28 (twenty-eight) days. [name], MD Past Month Active Self, Pharmacy Records           Med Note [facility] [place], [place] A   Tue Sep 17, 2023  4:18 PM)    montelukast (SINGULAIR) 10 MG tablet [id number] No Take 1 tablet (10 mg total) by mouth at bedtime. [name], MD 11/04/2023 Active Self, Pharmacy Records  mupirocin ointment (BACTROBAN) 2 % [id number] No Place 1 Application into the nose 2 (two) times daily.  Patient not taking: Reported on 11/05/2023   [name], DO Not Taking Active Self, Pharmacy Records  polyethylene glycol powder (GLYCOLAX/MIRALAX) 17 GM/SCOOP powder [id number] No Take 17 g by mouth daily.  Patient taking differently: Take 17 g by mouth as needed for moderate constipation.   [name], DO Taking Active Self, Pharmacy Records  predniSONE (DELTASONE) 20 MG tablet [id number]  Take 2 tablets (40 mg total) by mouth daily with breakfast. [name], MD  Active   pregabalin (LYRICA) 100 MG capsule [id number] No Take 1 capsule (100 mg total) by mouth 3 (three) times daily. [name], DO  11/05/2023 Morning Active Self, Pharmacy Records  triamcinolone ointment (KENALOG) 0.5 % [id number] No Apply 1 Application topically 2 (two) times daily. [name], DO Past Week Active Self, Pharmacy Records            Home Care and Equipment/Supplies: Were Home Health Services Ordered?: NA Any new equipment or medical supplies ordered?: NA  Functional Questionnaire: Do you need assistance with bathing/showering or dressing?: No Do you need assistance with meal preparation?: No Do you need assistance with eating?: No Do you have difficulty maintaining continence: No Do you need assistance with getting out of bed/getting out of a chair/moving?: No Do you have difficulty managing or taking your medications?: No  Follow up appointments reviewed: PCP Follow-up appointment confirmed?: Yes Date of PCP follow-up appointment?: 11/14/23 Follow-up Provider: [facility] Follow-up appointment confirmed?: NA Do you need transportation to your follow-up appointment?: No Do you understand care options if your condition(s) worsen?: Yes-patient verbalized understanding    SIGNATURE Karena Addison, LPN [facility] Nurse Health Advisor Direct Dial [phone]

## 2023-11-12 ENCOUNTER — Encounter: Payer: 59 | Admitting: Podiatry

## 2023-11-13 ENCOUNTER — Encounter (HOSPITAL_BASED_OUTPATIENT_CLINIC_OR_DEPARTMENT_OTHER): Payer: 59 | Attending: General Surgery | Admitting: General Surgery

## 2023-11-13 DIAGNOSIS — T8131XS Disruption of external operation (surgical) wound, not elsewhere classified, sequela: Secondary | ICD-10-CM | POA: Insufficient documentation

## 2023-11-13 DIAGNOSIS — Z89422 Acquired absence of other left toe(s): Secondary | ICD-10-CM | POA: Insufficient documentation

## 2023-11-13 DIAGNOSIS — E11621 Type 2 diabetes mellitus with foot ulcer: Secondary | ICD-10-CM | POA: Insufficient documentation

## 2023-11-13 DIAGNOSIS — L97522 Non-pressure chronic ulcer of other part of left foot with fat layer exposed: Secondary | ICD-10-CM | POA: Insufficient documentation

## 2023-11-13 DIAGNOSIS — X58XXXS Exposure to other specified factors, sequela: Secondary | ICD-10-CM | POA: Diagnosis not present

## 2023-11-14 ENCOUNTER — Other Ambulatory Visit: Payer: Self-pay

## 2023-11-14 ENCOUNTER — Encounter: Payer: Self-pay | Admitting: Student

## 2023-11-14 ENCOUNTER — Ambulatory Visit: Payer: 59 | Admitting: Student

## 2023-11-14 VITALS — BP 122/60 | HR 104 | Temp 98.2°F | Ht 75.0 in | Wt 230.1 lb

## 2023-11-14 DIAGNOSIS — Z7984 Long term (current) use of oral hypoglycemic drugs: Secondary | ICD-10-CM

## 2023-11-14 DIAGNOSIS — I2510 Atherosclerotic heart disease of native coronary artery without angina pectoris: Secondary | ICD-10-CM | POA: Diagnosis not present

## 2023-11-14 DIAGNOSIS — J4489 Other specified chronic obstructive pulmonary disease: Secondary | ICD-10-CM

## 2023-11-14 DIAGNOSIS — Z89432 Acquired absence of left foot: Secondary | ICD-10-CM | POA: Diagnosis not present

## 2023-11-14 DIAGNOSIS — E1151 Type 2 diabetes mellitus with diabetic peripheral angiopathy without gangrene: Secondary | ICD-10-CM

## 2023-11-14 DIAGNOSIS — Z794 Long term (current) use of insulin: Secondary | ICD-10-CM

## 2023-11-14 NOTE — Progress Notes (Signed)
 Subjective:  CC: Hospital follow-up COPD/asthma exacerbation.  HPI:  [name] is a 63 y.o. person with a past medical history stated below and presents today for the stated chief complaint. Please see problem based assessment and plan for additional details.  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 1-2 times daily     albuterol  (PROVENTIL ) (2.5 MG/3ML) 0.083% nebulizer solution Take 3 mLs (2.5 mg total) by nebulization 2 (two) times daily. 75 mL 5   albuterol  (VENTOLIN  HFA) 108 (90 Base) MCG/ACT inhaler Inhale 1-2 puffs into the lungs every 4 (four) hours as needed for wheezing or shortness of breath. 6.7 g 2   aspirin  EC 81 MG EC tablet Take 1 tablet (81 mg total) by mouth daily. 30 tablet 3   atorvastatin  (LIPITOR) 40 MG tablet Take 1 tablet (40 mg total) by mouth daily. 90 tablet 3   benzonatate  (TESSALON ) 200 MG capsule Take 1 capsule (200 mg total) by mouth 3 (three)  times daily as needed for cough. 30 capsule 1   Budeson-Glycopyrrol-Formoterol  (BREZTRI  AEROSPHERE) 160-9-4.8 MCG/ACT AERO Inhale 2 puffs into the lungs in the morning and at bedtime. 10.7 g 11   clopidogrel  (PLAVIX ) 75 MG tablet Take 1 tablet (75 mg total) by mouth daily. 90 tablet 3   Continuous Glucose Sensor (FREESTYLE LIBRE 3 SENSOR) MISC Place 1 sensor on the skin every 14 days. Use to check glucose continuously 6 each 3   Dulaglutide  (TRULICITY ) 4.5 MG/0.5ML SOAJ Inject 4.5 mg as directed once a week. 2 mL 5   Empagliflozin -metFORMIN  HCl (SYNJARDY ) 12.02-999 MG TABS Take 1 tablet by mouth in the morning and at bedtime. 60 tablet 5   escitalopram  (LEXAPRO ) 5 MG tablet Take 1 tablet (5 mg total) by mouth daily. 30 tablet 2   ferrous sulfate  325 (65 FE) MG tablet Take 1 tablet (325 mg total) by mouth daily. 30 tablet 3   fluticasone  (FLONASE ) 50 MCG/ACT nasal spray Place 1 spray into both nostrils daily. (Patient taking differently: Place 1 spray into both nostrils as needed for allergies.) 16 g 11   glucose blood test strip 1 each by Other route in the morning and at bedtime.     insulin  degludec (TRESIBA ) 100 UNIT/ML FlexTouch Pen Inject 20 Units into the skin daily.  15 mL 4   Insulin  Pen Needle (PEN NEEDLES) 32G X 4 MM MISC 1 each by Does not apply route daily. 100 each 4   ipratropium-albuterol  (DUONEB) 0.5-2.5 (3) MG/3ML SOLN Take 3 mLs by nebulization every 6 (six) hours as needed. 360 mL 0   meloxicam  (MOBIC ) 15 MG tablet Take 1 tablet (15 mg total) by mouth daily as needed for pain. 30 tablet 2   Mepolizumab  (NUCALA ) 100 MG/ML SOAJ Inject 1 mL (100 mg total) into the skin every 28 (twenty-eight) days. 1 mL 5   montelukast  (SINGULAIR ) 10 MG tablet Take 1 tablet (10 mg total) by mouth at bedtime. 90 tablet 3   mupirocin  ointment (BACTROBAN ) 2 % Place 1 Application into the nose 2 (two) times daily. (Patient not taking: Reported on 11/05/2023) 22 g 0   polyethylene glycol powder  (GLYCOLAX /MIRALAX ) 17 GM/SCOOP powder Take 17 g by mouth daily. (Patient taking differently: Take 17 g by mouth as needed for moderate constipation.) 238 g 0   pregabalin  (LYRICA ) 100 MG capsule Take 1 capsule (100 mg total) by mouth 3 (three) times daily. 90 capsule 2   triamcinolone  ointment (KENALOG ) 0.5 % Apply 1 Application topically 2 (two) times daily. 60 g 3   No current facility-administered medications on file prior to visit.    Review of Systems: Please see assessment and plan for pertinent positives and negatives.  Objective:   Vitals:   11/14/23 0848  BP: 122/60  Pulse: (!) 104  Temp: 98.2 F (36.8 C)  TempSrc: Oral  SpO2: 98%  Weight: 230 lb 1.6 oz (104.4 kg)  Height: 6' 3 (1.905 m)    Physical Exam: Constitutional: Well-appearing Cardiovascular: Regular rate and rhythm, murmur appreciated Pulmonary/Chest: lungs clear to auscultation bilaterally Abdominal: soft, non-tender, non-distended Extremities: No edema of the lower extremities bilaterally Psych: Very pleasant affect Thought process is linear and is goal-directed.     Assessment & Plan:  Asthma-COPD overlap syndrome Recent admission for asthma and COPD.  Patient discharged with prednisone  taper and as needed DuoNebs.  Patient says he completed his taper, has been using his inhalers at home consistently.  Endorses great improvement to his symptoms.  Denies cough, fevers, chills, shortness of breath, chest pain, wheezing at this time. Plan: Continue current regiment   CAD in native artery He is continuing home ASA, Plavix , and statin.  Denies any chest pain, exertional dyspnea, lower extremity edema.  Type 2 diabetes mellitus with peripheral artery disease (HCC) Most recent A1c 7.9 about 1 month ago.  He is taking Synjardy  12.02-999 twice daily, Toujeo  4.5 mg weekly, Tresiba  20 units daily.  Denies nausea, vomiting, abdominal pain.  He does not drink any liquid calories, drinks water  and Gatorade 0.   Does not have much of a sweet tooth.  Weights have been relatively consistent around 230 pounds. Plan: Discussed the patient's A1c Discussed lifestyle modifications Follow-up 2 months for repeat A1c, dedicated diabetes follow-up  Recommendations for weight loss and blood glucose control.   1) Cut out liquid sources of calories such as soda, juices, sports drinks, alcohol , sweet teas, and coffee with added sugars. These are empty calories meaning they bring your blood sugar up and add weight to your body without making you feel full.   2) Avoid food with added sugars. Some examples would be cakes, candies, and sauces (ie. ketchup and BBQ). You may be surprised how much sugar they sneak into processed foods.   3) Replace foods high in starches such as  pasta, rice, potatoes, or breads with foods higher in protein such as meat.  Protein rich foods are more satiating - meaning they make you feel more full than carbohydrates. They also provide the body with important nutrients that we need. You can read nutritional labels to get more information regarding how much protein, carbohydrates, and fats foods contain.   S/P transmetatarsal amputation of foot, left (HCC) Recently seen wound care, says pain is better controlled and is looking good.  Denies any fevers, chills, night sweats, erythema, swelling, or discharge from the wound.   Patient discussed with Dr. Lovie Trilby Europe MD [facility] Internal [facility]  PGY-1 Pager: [phone]  Phone: [phone] Date 11/14/2023  Time 10:54 AM

## 2023-11-14 NOTE — Assessment & Plan Note (Signed)
 Recently seen wound care, says pain is better controlled and is looking good.  Denies any fevers, chills, night sweats, erythema, swelling, or discharge from the wound.

## 2023-11-14 NOTE — Assessment & Plan Note (Signed)
 He is continuing home ASA, Plavix , and statin.  Denies any chest pain, exertional dyspnea, lower extremity edema.

## 2023-11-14 NOTE — Assessment & Plan Note (Signed)
 Most recent A1c 7.9 about 1 month ago.  He is taking Synjardy  12.02-999 twice daily, Toujeo  4.5 mg weekly, Tresiba  20 units daily.  Denies nausea, vomiting, abdominal pain.  He does not drink any liquid calories, drinks water  and Gatorade 0.  Does not have much of a sweet tooth.  Weights have been relatively consistent around 230 pounds. Plan: Discussed the patient's A1c Discussed lifestyle modifications Follow-up 2 months for repeat A1c, dedicated diabetes follow-up  Recommendations for weight loss and blood glucose control.   1) Cut out liquid sources of calories such as soda, juices, sports drinks, alcohol , sweet teas, and coffee with added sugars. These are empty calories meaning they bring your blood sugar up and add weight to your body without making you feel full.   2) Avoid food with added sugars. Some examples would be cakes, candies, and sauces (ie. ketchup and BBQ). You may be surprised how much sugar they sneak into processed foods.   3) Replace foods high in starches such as pasta, rice, potatoes, or breads with foods higher in protein such as meat.  Protein rich foods are more satiating - meaning they make you feel more full than carbohydrates. They also provide the body with important nutrients that we need. You can read nutritional labels to get more information regarding how much protein, carbohydrates, and fats foods contain.

## 2023-11-14 NOTE — Patient Instructions (Addendum)
 Thank you, Mr.Sean Agrawal for allowing us  to provide your care today.    Recommendations for weight loss and blood glucose control.   1) Cut out liquid sources of calories such as soda, juices, sports drinks, alcohol , sweet teas, and coffee with added sugars. These are empty calories meaning they bring your blood sugar up and add weight to your body without making you feel full.   2) Avoid food with added sugars. Some examples would be cakes, candies, and sauces (ie. ketchup and BBQ). You may be surprised how much sugar they sneak into processed foods.   3) Replace foods high in starches such as pasta, rice, potatoes, or breads with foods higher in protein such as meat.  Protein rich foods are more satiating - meaning they make you feel more full than carbohydrates. They also provide the body with important nutrients that we need. You can read nutritional labels to get more information regarding how much protein, carbohydrates, and fats foods contain.   Follow up: 2 months      We look forward to seeing you next time. Please call our clinic at [phone] if you have any questions or concerns. The best time to call is Monday-Friday from 9am-4pm, but there is someone available 24/7. If after hours or the weekend, call the main hospital number and ask for the Internal Medicine Resident On-Call. If you need medication refills, please notify your pharmacy one week in advance and they will send us  a request.   Thank you for trusting me with your care. Wishing you the best!  Trilby Europe MD [facility]

## 2023-11-14 NOTE — Assessment & Plan Note (Addendum)
 Recent admission for asthma and COPD.  Patient discharged with prednisone  taper and as needed DuoNebs.  Patient says he completed his taper, has been using his inhalers at home consistently.  Endorses great improvement to his symptoms.  Denies cough, fevers, chills, shortness of breath, chest pain, wheezing at this time. Plan: Continue current regiment

## 2023-11-18 ENCOUNTER — Other Ambulatory Visit: Payer: Self-pay

## 2023-11-18 DIAGNOSIS — J4489 Other specified chronic obstructive pulmonary disease: Secondary | ICD-10-CM

## 2023-11-18 MED ORDER — ALBUTEROL SULFATE HFA 108 (90 BASE) MCG/ACT IN AERS: 1.0000 | INHALATION_SPRAY | RESPIRATORY_TRACT | 5 refills | Status: DC | PRN

## 2023-11-18 NOTE — Progress Notes (Signed)
 Internal Medicine Clinic Attending  Case discussed with the resident at the time of the visit.  We reviewed the resident's history and exam and pertinent patient test results.  I agree with the assessment, diagnosis, and plan of care documented in the resident's note.

## 2023-11-19 ENCOUNTER — Encounter (HOSPITAL_BASED_OUTPATIENT_CLINIC_OR_DEPARTMENT_OTHER): Payer: 59 | Admitting: General Surgery

## 2023-11-19 DIAGNOSIS — E11621 Type 2 diabetes mellitus with foot ulcer: Secondary | ICD-10-CM | POA: Diagnosis not present

## 2023-11-19 DIAGNOSIS — T8131XS Disruption of external operation (surgical) wound, not elsewhere classified, sequela: Secondary | ICD-10-CM | POA: Diagnosis not present

## 2023-11-19 DIAGNOSIS — L97522 Non-pressure chronic ulcer of other part of left foot with fat layer exposed: Secondary | ICD-10-CM | POA: Diagnosis not present

## 2023-11-19 DIAGNOSIS — Z89422 Acquired absence of other left toe(s): Secondary | ICD-10-CM | POA: Diagnosis not present

## 2023-11-26 ENCOUNTER — Other Ambulatory Visit: Payer: Self-pay

## 2023-11-27 ENCOUNTER — Encounter (HOSPITAL_BASED_OUTPATIENT_CLINIC_OR_DEPARTMENT_OTHER): Payer: 59 | Admitting: General Surgery

## 2023-11-27 DIAGNOSIS — R0602 Shortness of breath: Secondary | ICD-10-CM | POA: Diagnosis not present

## 2023-11-27 DIAGNOSIS — Z87891 Personal history of nicotine dependence: Secondary | ICD-10-CM | POA: Diagnosis not present

## 2023-11-27 DIAGNOSIS — Z825 Family history of asthma and other chronic lower respiratory diseases: Secondary | ICD-10-CM | POA: Diagnosis not present

## 2023-11-27 DIAGNOSIS — J45909 Unspecified asthma, uncomplicated: Secondary | ICD-10-CM | POA: Diagnosis not present

## 2023-11-27 DIAGNOSIS — E1151 Type 2 diabetes mellitus with diabetic peripheral angiopathy without gangrene: Secondary | ICD-10-CM | POA: Diagnosis not present

## 2023-11-27 DIAGNOSIS — Z79899 Other long term (current) drug therapy: Secondary | ICD-10-CM | POA: Diagnosis not present

## 2023-11-27 DIAGNOSIS — I2510 Atherosclerotic heart disease of native coronary artery without angina pectoris: Secondary | ICD-10-CM | POA: Diagnosis not present

## 2023-11-27 DIAGNOSIS — J45901 Unspecified asthma with (acute) exacerbation: Secondary | ICD-10-CM | POA: Diagnosis not present

## 2023-11-27 DIAGNOSIS — F17210 Nicotine dependence, cigarettes, uncomplicated: Secondary | ICD-10-CM | POA: Diagnosis not present

## 2023-11-27 DIAGNOSIS — R079 Chest pain, unspecified: Secondary | ICD-10-CM | POA: Diagnosis not present

## 2023-11-27 DIAGNOSIS — Z8614 Personal history of Methicillin resistant Staphylococcus aureus infection: Secondary | ICD-10-CM | POA: Diagnosis not present

## 2023-11-27 DIAGNOSIS — D3709 Neoplasm of uncertain behavior of other specified sites of the oral cavity: Secondary | ICD-10-CM | POA: Diagnosis not present

## 2023-11-27 DIAGNOSIS — J018 Other acute sinusitis: Secondary | ICD-10-CM | POA: Diagnosis not present

## 2023-11-27 DIAGNOSIS — Z791 Long term (current) use of non-steroidal anti-inflammatories (NSAID): Secondary | ICD-10-CM | POA: Diagnosis not present

## 2023-11-27 DIAGNOSIS — Z8249 Family history of ischemic heart disease and other diseases of the circulatory system: Secondary | ICD-10-CM | POA: Diagnosis not present

## 2023-11-27 DIAGNOSIS — J441 Chronic obstructive pulmonary disease with (acute) exacerbation: Secondary | ICD-10-CM | POA: Diagnosis not present

## 2023-11-27 DIAGNOSIS — R0603 Acute respiratory distress: Secondary | ICD-10-CM | POA: Diagnosis not present

## 2023-11-27 DIAGNOSIS — Z7982 Long term (current) use of aspirin: Secondary | ICD-10-CM | POA: Diagnosis not present

## 2023-11-27 DIAGNOSIS — Z1152 Encounter for screening for COVID-19: Secondary | ICD-10-CM | POA: Diagnosis not present

## 2023-11-27 DIAGNOSIS — E785 Hyperlipidemia, unspecified: Secondary | ICD-10-CM | POA: Diagnosis not present

## 2023-11-27 DIAGNOSIS — E119 Type 2 diabetes mellitus without complications: Secondary | ICD-10-CM | POA: Diagnosis not present

## 2023-11-27 DIAGNOSIS — I10 Essential (primary) hypertension: Secondary | ICD-10-CM | POA: Diagnosis not present

## 2023-11-27 DIAGNOSIS — D3705 Neoplasm of uncertain behavior of pharynx: Secondary | ICD-10-CM | POA: Diagnosis not present

## 2023-11-27 DIAGNOSIS — E1140 Type 2 diabetes mellitus with diabetic neuropathy, unspecified: Secondary | ICD-10-CM | POA: Diagnosis not present

## 2023-11-27 DIAGNOSIS — Z794 Long term (current) use of insulin: Secondary | ICD-10-CM | POA: Diagnosis not present

## 2023-11-27 DIAGNOSIS — I350 Nonrheumatic aortic (valve) stenosis: Secondary | ICD-10-CM | POA: Diagnosis not present

## 2023-11-27 DIAGNOSIS — Z91013 Allergy to seafood: Secondary | ICD-10-CM | POA: Diagnosis not present

## 2023-11-27 DIAGNOSIS — D509 Iron deficiency anemia, unspecified: Secondary | ICD-10-CM | POA: Diagnosis not present

## 2023-11-27 DIAGNOSIS — E1165 Type 2 diabetes mellitus with hyperglycemia: Secondary | ICD-10-CM | POA: Diagnosis not present

## 2023-11-27 DIAGNOSIS — Z7902 Long term (current) use of antithrombotics/antiplatelets: Secondary | ICD-10-CM | POA: Diagnosis not present

## 2023-11-28 ENCOUNTER — Other Ambulatory Visit: Payer: Self-pay

## 2023-11-28 ENCOUNTER — Encounter (HOSPITAL_COMMUNITY): Payer: Self-pay | Admitting: Emergency Medicine

## 2023-11-28 ENCOUNTER — Emergency Department (HOSPITAL_COMMUNITY): Payer: 59

## 2023-11-28 ENCOUNTER — Inpatient Hospital Stay (HOSPITAL_COMMUNITY)
Admission: EM | Admit: 2023-11-28 | Discharge: 2023-12-04 | DRG: 202 | Disposition: A | Discharge status: Home / Self Care | Payer: 59 | Attending: Internal Medicine | Admitting: Internal Medicine

## 2023-11-28 DIAGNOSIS — Z825 Family history of asthma and other chronic lower respiratory diseases: Secondary | ICD-10-CM

## 2023-11-28 DIAGNOSIS — R079 Chest pain, unspecified: Secondary | ICD-10-CM

## 2023-11-28 DIAGNOSIS — R0603 Acute respiratory distress: Principal | ICD-10-CM

## 2023-11-28 DIAGNOSIS — J441 Chronic obstructive pulmonary disease with (acute) exacerbation: Secondary | ICD-10-CM | POA: Diagnosis not present

## 2023-11-28 DIAGNOSIS — E1140 Type 2 diabetes mellitus with diabetic neuropathy, unspecified: Secondary | ICD-10-CM | POA: Diagnosis present

## 2023-11-28 DIAGNOSIS — E785 Hyperlipidemia, unspecified: Secondary | ICD-10-CM | POA: Diagnosis present

## 2023-11-28 DIAGNOSIS — F17210 Nicotine dependence, cigarettes, uncomplicated: Secondary | ICD-10-CM | POA: Diagnosis present

## 2023-11-28 DIAGNOSIS — Z91013 Allergy to seafood: Secondary | ICD-10-CM

## 2023-11-28 DIAGNOSIS — R0602 Shortness of breath: Secondary | ICD-10-CM | POA: Diagnosis not present

## 2023-11-28 DIAGNOSIS — J018 Other acute sinusitis: Secondary | ICD-10-CM | POA: Diagnosis present

## 2023-11-28 DIAGNOSIS — E1165 Type 2 diabetes mellitus with hyperglycemia: Secondary | ICD-10-CM | POA: Diagnosis present

## 2023-11-28 DIAGNOSIS — D3705 Neoplasm of uncertain behavior of pharynx: Secondary | ICD-10-CM

## 2023-11-28 DIAGNOSIS — Z79899 Other long term (current) drug therapy: Secondary | ICD-10-CM

## 2023-11-28 DIAGNOSIS — I10 Essential (primary) hypertension: Secondary | ICD-10-CM | POA: Diagnosis present

## 2023-11-28 DIAGNOSIS — Z794 Long term (current) use of insulin: Secondary | ICD-10-CM

## 2023-11-28 DIAGNOSIS — Z7982 Long term (current) use of aspirin: Secondary | ICD-10-CM

## 2023-11-28 DIAGNOSIS — J45901 Unspecified asthma with (acute) exacerbation: Principal | ICD-10-CM

## 2023-11-28 DIAGNOSIS — D3709 Neoplasm of uncertain behavior of other specified sites of the oral cavity: Secondary | ICD-10-CM | POA: Diagnosis present

## 2023-11-28 DIAGNOSIS — Z8249 Family history of ischemic heart disease and other diseases of the circulatory system: Secondary | ICD-10-CM

## 2023-11-28 DIAGNOSIS — J4489 Other specified chronic obstructive pulmonary disease: Secondary | ICD-10-CM

## 2023-11-28 DIAGNOSIS — Z8614 Personal history of Methicillin resistant Staphylococcus aureus infection: Secondary | ICD-10-CM

## 2023-11-28 DIAGNOSIS — I350 Nonrheumatic aortic (valve) stenosis: Secondary | ICD-10-CM | POA: Diagnosis present

## 2023-11-28 DIAGNOSIS — D509 Iron deficiency anemia, unspecified: Secondary | ICD-10-CM | POA: Diagnosis present

## 2023-11-28 DIAGNOSIS — Z7985 Long-term (current) use of injectable non-insulin antidiabetic drugs: Secondary | ICD-10-CM

## 2023-11-28 DIAGNOSIS — Z7902 Long term (current) use of antithrombotics/antiplatelets: Secondary | ICD-10-CM

## 2023-11-28 DIAGNOSIS — Z1152 Encounter for screening for COVID-19: Secondary | ICD-10-CM

## 2023-11-28 DIAGNOSIS — Z7951 Long term (current) use of inhaled steroids: Secondary | ICD-10-CM

## 2023-11-28 DIAGNOSIS — Z791 Long term (current) use of non-steroidal anti-inflammatories (NSAID): Secondary | ICD-10-CM

## 2023-11-28 DIAGNOSIS — I2510 Atherosclerotic heart disease of native coronary artery without angina pectoris: Secondary | ICD-10-CM | POA: Diagnosis present

## 2023-11-28 DIAGNOSIS — E1151 Type 2 diabetes mellitus with diabetic peripheral angiopathy without gangrene: Secondary | ICD-10-CM | POA: Diagnosis present

## 2023-11-28 LAB — I-STAT ARTERIAL BLOOD GAS, ED
Acid-base deficit: 1 mmol/L (ref 0.0–2.0)
Bicarbonate: 23.7 mmol/L (ref 20.0–28.0)
Calcium, Ion: 1.24 mmol/L (ref 1.15–1.40)
HCT: 40 % (ref 39.0–52.0)
Hemoglobin: 13.6 g/dL (ref 13.0–17.0)
O2 Saturation: 100 %
Patient temperature: 98.5
Potassium: 4.1 mmol/L (ref 3.5–5.1)
Sodium: 136 mmol/L (ref 135–145)
TCO2: 25 mmol/L (ref 22–32)
pCO2 arterial: 38.2 mm[Hg] (ref 32–48)
pH, Arterial: 7.401 (ref 7.35–7.45)
pO2, Arterial: 232 mm[Hg] — ABNORMAL HIGH (ref 83–108)

## 2023-11-28 LAB — BASIC METABOLIC PANEL
Anion gap: 12 (ref 5–15)
BUN: 16 mg/dL (ref 8–23)
CO2: 21 mmol/L — ABNORMAL LOW (ref 22–32)
Calcium: 9.1 mg/dL (ref 8.9–10.3)
Chloride: 103 mmol/L (ref 98–111)
Creatinine, Ser: 1.02 mg/dL (ref 0.61–1.24)
GFR, Estimated: >60 mL/min (ref >60)
Glucose, Bld: 168 mg/dL — ABNORMAL HIGH (ref 70–99)
Potassium: 4.2 mmol/L (ref 3.5–5.1)
Sodium: 136 mmol/L (ref 135–145)

## 2023-11-28 LAB — BRAIN NATRIURETIC PEPTIDE: B Natriuretic Peptide: 48.5 pg/mL (ref 0.0–100.0)

## 2023-11-28 LAB — CBC WITH DIFFERENTIAL/PLATELET
Abs Immature Granulocytes: 0.06 10*3/uL (ref 0.00–0.07)
Basophils Absolute: 0 10*3/uL (ref 0.0–0.1)
Basophils Relative: 1 %
Eosinophils Absolute: 0.1 10*3/uL (ref 0.0–0.5)
Eosinophils Relative: 2 %
HCT: 42.8 % (ref 39.0–52.0)
Hemoglobin: 13.6 g/dL (ref 13.0–17.0)
Immature Granulocytes: 1 %
Lymphocytes Relative: 11 %
Lymphs Abs: 0.5 10*3/uL — ABNORMAL LOW (ref 0.7–4.0)
MCH: 25.8 pg — ABNORMAL LOW (ref 26.0–34.0)
MCHC: 31.8 g/dL (ref 30.0–36.0)
MCV: 81.2 fL (ref 80.0–100.0)
Monocytes Absolute: 0.5 10*3/uL (ref 0.1–1.0)
Monocytes Relative: 11 %
Neutro Abs: 3.3 10*3/uL (ref 1.7–7.7)
Neutrophils Relative %: 74 %
Platelets: 246 10*3/uL (ref 150–400)
RBC: 5.27 MIL/uL (ref 4.22–5.81)
RDW: 15.3 % (ref 11.5–15.5)
WBC: 4.5 10*3/uL (ref 4.0–10.5)
nRBC: 0 % (ref 0.0–0.2)

## 2023-11-28 LAB — RESPIRATORY PANEL BY PCR

## 2023-11-28 LAB — RESP PANEL BY RT-PCR (RSV, FLU A&B, COVID)  RVPGX2
Influenza A by PCR: NEGATIVE
Influenza B by PCR: NEGATIVE
Resp Syncytial Virus by PCR: NEGATIVE
SARS Coronavirus 2 by RT PCR: NEGATIVE

## 2023-11-28 LAB — CBG MONITORING, ED
Glucose-Capillary: 225 mg/dL — ABNORMAL HIGH (ref 70–99)
Glucose-Capillary: 262 mg/dL — ABNORMAL HIGH (ref 70–99)

## 2023-11-28 LAB — GLUCOSE, CAPILLARY
Glucose-Capillary: 233 mg/dL — ABNORMAL HIGH (ref 70–99)
Glucose-Capillary: 262 mg/dL — ABNORMAL HIGH (ref 70–99)

## 2023-11-28 LAB — SEDIMENTATION RATE: Sed Rate: 15 mm/h (ref 0–16)

## 2023-11-28 LAB — TROPONIN I (HIGH SENSITIVITY)
Troponin I (High Sensitivity): 5 ng/L (ref <18)
Troponin I (High Sensitivity): 4 ng/L (ref <18)

## 2023-11-28 LAB — C-REACTIVE PROTEIN: CRP: 2.3 mg/dL — ABNORMAL HIGH (ref <1.0)

## 2023-11-28 MED ORDER — MOMETASONE FURO-FORMOTEROL FUM 100-5 MCG/ACT IN AERO
2.0000 | INHALATION_SPRAY | Freq: Two times a day (BID) | RESPIRATORY_TRACT | Status: DC
  Administered 2023-11-28 – 2023-12-04 (×13): 2 via RESPIRATORY_TRACT
  Filled 2023-11-28: qty 8.8

## 2023-11-28 MED ORDER — ATORVASTATIN CALCIUM 40 MG PO TABS
40.0000 mg | ORAL_TABLET | Freq: Every day | ORAL | Status: DC
  Administered 2023-11-28 – 2023-12-04 (×7): 40 mg via ORAL
  Filled 2023-11-28 (×7): qty 1

## 2023-11-28 MED ORDER — ACETAMINOPHEN 650 MG RE SUPP: 650.0000 mg | Freq: Four times a day (QID) | RECTAL | Status: DC | PRN

## 2023-11-28 MED ORDER — IPRATROPIUM-ALBUTEROL 0.5-2.5 (3) MG/3ML IN SOLN
3.0000 mL | RESPIRATORY_TRACT | Status: DC
  Administered 2023-11-28 – 2023-11-30 (×12): 3 mL via RESPIRATORY_TRACT
  Filled 2023-11-28 (×12): qty 3

## 2023-11-28 MED ORDER — BUDESON-GLYCOPYRROL-FORMOTEROL 160-9-4.8 MCG/ACT IN AERO
2.0000 | INHALATION_SPRAY | Freq: Two times a day (BID) | RESPIRATORY_TRACT | Status: DC
  Filled 2023-11-28: qty 0.42

## 2023-11-28 MED ORDER — ASPIRIN 81 MG PO TBEC
81.0000 mg | DELAYED_RELEASE_TABLET | Freq: Every day | ORAL | Status: DC
  Administered 2023-11-28 – 2023-12-04 (×7): 81 mg via ORAL
  Filled 2023-11-28 (×7): qty 1

## 2023-11-28 MED ORDER — FLUTICASONE PROPIONATE 50 MCG/ACT NA SUSP
1.0000 | Freq: Every day | NASAL | Status: DC
  Administered 2023-12-03: 1 via NASAL
  Filled 2023-11-28: qty 16

## 2023-11-28 MED ORDER — UMECLIDINIUM BROMIDE 62.5 MCG/ACT IN AEPB
1.0000 | INHALATION_SPRAY | Freq: Every day | RESPIRATORY_TRACT | Status: DC
  Administered 2023-11-28 – 2023-12-04 (×7): 1 via RESPIRATORY_TRACT
  Filled 2023-11-28 (×2): qty 7

## 2023-11-28 MED ORDER — METHYLPREDNISOLONE SODIUM SUCC 125 MG IJ SOLR
125.0000 mg | Freq: Once | INTRAMUSCULAR | Status: AC
  Administered 2023-11-28: 125 mg via INTRAVENOUS
  Filled 2023-11-28: qty 2

## 2023-11-28 MED ORDER — MAGNESIUM SULFATE 2 GM/50ML IV SOLN
2.0000 g | Freq: Once | INTRAVENOUS | Status: AC
  Administered 2023-11-28: 2 g via INTRAVENOUS
  Filled 2023-11-28: qty 50

## 2023-11-28 MED ORDER — INSULIN ASPART 100 UNIT/ML IJ SOLN
0.0000 [IU] | Freq: Three times a day (TID) | INTRAMUSCULAR | Status: DC
  Administered 2023-11-28 (×2): 5 [IU] via SUBCUTANEOUS
  Administered 2023-11-29: 3 [IU] via SUBCUTANEOUS
  Administered 2023-11-29: 15 [IU] via SUBCUTANEOUS
  Administered 2023-11-29: 5 [IU] via SUBCUTANEOUS

## 2023-11-28 MED ORDER — PREDNISONE 20 MG PO TABS
40.0000 mg | ORAL_TABLET | Freq: Every day | ORAL | Status: AC
  Administered 2023-11-29 – 2023-12-02 (×4): 40 mg via ORAL
  Filled 2023-11-28 (×4): qty 2

## 2023-11-28 MED ORDER — ALBUTEROL SULFATE (2.5 MG/3ML) 0.083% IN NEBU
10.0000 mg/h | INHALATION_SOLUTION | Freq: Once | RESPIRATORY_TRACT | Status: AC
  Administered 2023-11-28: 10 mg/h via RESPIRATORY_TRACT
  Filled 2023-11-28: qty 12

## 2023-11-28 MED ORDER — PREGABALIN 50 MG PO CAPS
100.0000 mg | ORAL_CAPSULE | Freq: Three times a day (TID) | ORAL | Status: DC
Start: 2023-11-28 — End: 2023-12-04
  Administered 2023-11-28 – 2023-12-04 (×19): 100 mg via ORAL
  Filled 2023-11-28: qty 2
  Filled 2023-11-28: qty 4
  Filled 2023-11-28 (×7): qty 2
  Filled 2023-11-28: qty 1
  Filled 2023-11-28 (×7): qty 2
  Filled 2023-11-28: qty 1
  Filled 2023-11-28: qty 2

## 2023-11-28 MED ORDER — MONTELUKAST SODIUM 10 MG PO TABS
10.0000 mg | ORAL_TABLET | Freq: Every day | ORAL | Status: DC
  Administered 2023-11-28 – 2023-12-03 (×6): 10 mg via ORAL
  Filled 2023-11-28 (×6): qty 1

## 2023-11-28 MED ORDER — INSULIN DEGLUDEC 100 UNIT/ML ~~LOC~~ SOPN
20.0000 [IU] | PEN_INJECTOR | Freq: Every day | SUBCUTANEOUS | Status: DC
  Filled 2023-11-28: qty 3

## 2023-11-28 MED ORDER — INSULIN GLARGINE-YFGN 100 UNIT/ML ~~LOC~~ SOLN
20.0000 [IU] | Freq: Every day | SUBCUTANEOUS | Status: DC
  Administered 2023-11-28 – 2023-11-30 (×3): 20 [IU] via SUBCUTANEOUS
  Filled 2023-11-28 (×3): qty 0.2

## 2023-11-28 MED ORDER — BENZONATATE 100 MG PO CAPS
200.0000 mg | ORAL_CAPSULE | Freq: Three times a day (TID) | ORAL | Status: DC | PRN
  Administered 2023-11-28 – 2023-11-29 (×2): 200 mg via ORAL
  Filled 2023-11-28 (×2): qty 2

## 2023-11-28 MED ORDER — ACETAMINOPHEN 325 MG PO TABS
650.0000 mg | ORAL_TABLET | Freq: Four times a day (QID) | ORAL | Status: DC | PRN
  Administered 2023-11-29: 650 mg via ORAL
  Filled 2023-11-28 (×2): qty 2

## 2023-11-28 MED ORDER — IPRATROPIUM-ALBUTEROL 0.5-2.5 (3) MG/3ML IN SOLN
3.0000 mL | Freq: Once | RESPIRATORY_TRACT | Status: AC
  Administered 2023-11-28: 3 mL via RESPIRATORY_TRACT

## 2023-11-28 MED ORDER — POLYETHYLENE GLYCOL 3350 17 GM/SCOOP PO POWD: 17.0000 g | ORAL | Status: DC | PRN

## 2023-11-28 MED ORDER — CLOPIDOGREL BISULFATE 75 MG PO TABS
75.0000 mg | ORAL_TABLET | Freq: Every day | ORAL | Status: DC
Start: 2023-11-28 — End: 2023-12-04
  Administered 2023-11-28 – 2023-12-04 (×7): 75 mg via ORAL
  Filled 2023-11-28 (×7): qty 1

## 2023-11-28 MED ORDER — FERROUS SULFATE 325 (65 FE) MG PO TABS
325.0000 mg | ORAL_TABLET | Freq: Every day | ORAL | Status: DC
  Administered 2023-11-28 – 2023-12-04 (×7): 325 mg via ORAL
  Filled 2023-11-28 (×7): qty 1

## 2023-11-28 MED ORDER — SODIUM CHLORIDE 0.9 % IV SOLN
500.0000 mg | INTRAVENOUS | Status: DC
  Administered 2023-11-28 – 2023-11-29 (×2): 500 mg via INTRAVENOUS
  Filled 2023-11-28 (×2): qty 5

## 2023-11-28 MED ORDER — RIVAROXABAN 10 MG PO TABS
10.0000 mg | ORAL_TABLET | Freq: Every day | ORAL | Status: DC
  Administered 2023-11-28 – 2023-12-04 (×7): 10 mg via ORAL
  Filled 2023-11-28 (×8): qty 1

## 2023-11-28 MED ORDER — NAPROXEN 250 MG PO TABS
375.0000 mg | ORAL_TABLET | Freq: Three times a day (TID) | ORAL | Status: DC | PRN
  Administered 2023-11-30 – 2023-12-01 (×4): 375 mg via ORAL
  Filled 2023-11-28 (×3): qty 2
  Filled 2023-11-28: qty 1
  Filled 2023-11-28 (×2): qty 2

## 2023-11-28 NOTE — Hospital Course (Addendum)
 Acute COPD/asthma exacerbation Negative for COVID-19, influenza, and RSV. RVP was negative. He has been afebrile without leukocytosis, decreasing suspicion for bacterial infection or bacterial pneumonia; CXR unremarkable. e does have a pronounced inspiratory wheeze localized to the upper airway during exam, I was not able to appreciate inspiratory wheezes on pulmonary exam.  With patient's chronic inspiratory wheeze at baseline, I do wonder if he has inducible laryngeal obstruction.   Chest pain Pleuritic in nature with onset at time of increased cough and dyspnea overnight 02/19. Pain is most likely due to an MSK origin such as costochondritis from increase in cough. Cardiology was consulted for possible pericarditis with the ST segment changes on EKG, they have signed off. Pleuritic in nature with onset at time of increased cough and dyspnea overnight 02/19. Pain is most likely due to an MSK origin such as costochondritis from increase in cough. Cardiology was consulted for possible pericarditis with the ST segment changes on EKG, they have signed off.   Type 2 diabetes mellitus HbA1c 7.9% 09/2023. Home regimen includes trulicity 4.5 mg weekly, synjardy 12.02-999 mg BID, tresiba 20 units daily  HLD CAD No evidence of cardiac involvement of current presentation. Continued home ASA 81 mg daily, atoravastatin 40 mg daily, plavix 75 mg daily   Neuropathy S/p L third toe amputation Continued home lyrica 100 mg TID and naproxen 375 mg TID with meals PRN mild pain   Hx iron deficiency anemia Continued home ferrous sulfate 325 mg daily
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
## 2023-11-28 NOTE — ED Notes (Signed)
 Pt otf with transport in no new onset distress at this time.

## 2023-11-28 NOTE — ED Provider Notes (Signed)
 [place] EMERGENCY DEPARTMENT AT [facility] Provider Note   CSN: [id number] Arrival date & time: 11/28/23  1914     History {Add pertinent medical, surgical, social history, OB history to HPI:1} Chief Complaint  Patient presents with  . Shortness of Breath    Richard [name] is a 63 y.o. male.  Richard [name] is a 64 y.o. male with a history of asthma, COPD, diabetes, hypertension, hyperlipidemia, who presents to the ED for evaluation of shortness of breath.  Patient reports that he started experiencing shortness of breath last night and it worsened significantly earlier this morning.  He is taken for nebulized breathing treatments at home with minimal relief.  He reports that he has had a cough over the past 2 to 3 days without fevers or congestion but shortness of breath really did not start to bother him until yesterday.  Denies any changes to his chronic medications and reports that he has been taking them regularly.  Patient had similar presentation in January and was admitted for COPD/asthma exacerbation and required BiPAP.  He reports this feels similar.  The history is provided by the patient and the spouse.  Shortness of Breath Associated symptoms: chest pain and cough   Associated symptoms: no fever and no vomiting        Home Medications Prior to Admission medications   Medication Sig Start Date End Date Taking? Authorizing Provider  Accu-Chek Softclix Lancets lancets 1 each by Other route See admin instructions. 1-2 times daily 06/27/23   [provider]  albuterol (PROVENTIL) (2.5 MG/3ML) 0.083% nebulizer solution Take 3 mLs (2.5 mg total) by nebulization 2 (two) times daily. 04/18/23   [name], DO  albuterol (VENTOLIN HFA) 108 (90 Base) MCG/ACT inhaler Inhale 1-2 puffs into the lungs every 4 (four) hours as needed for wheezing or shortness of breath. 11/18/23   [name], DO  aspirin EC 81 MG EC tablet Take 1 tablet (81 mg total) by  mouth daily. 09/26/15   Burns, [name], MD  atorvastatin (LIPITOR) 40 MG tablet Take 1 tablet (40 mg total) by mouth daily. 09/17/23   [name], DO  benzonatate (TESSALON) 200 MG capsule Take 1 capsule (200 mg total) by mouth 3 (three) times daily as needed for cough. 10/31/23   [name], DO  Budeson-Glycopyrrol-Formoterol (BREZTRI AEROSPHERE) 160-9-4.8 MCG/ACT AERO Inhale 2 puffs into the lungs in the morning and at bedtime. 12/05/22   [name], MD  clopidogrel (PLAVIX) 75 MG tablet Take 1 tablet (75 mg total) by mouth daily. 09/26/23   [name], DO  Continuous Glucose Sensor (FREESTYLE LIBRE 3 SENSOR) MISC Place 1 sensor on the skin every 14 days. Use to check glucose continuously 06/20/23   [name], DO  Dulaglutide (TRULICITY) 4.5 MG/0.5ML SOAJ Inject 4.5 mg as directed once a week. 10/31/23   [name], DO  Empagliflozin-metFORMIN HCl (SYNJARDY) 12.02-999 MG TABS Take 1 tablet by mouth in the morning and at bedtime. 08/15/23 02/11/24  [name], MD  escitalopram (LEXAPRO) 5 MG tablet Take 1 tablet (5 mg total) by mouth daily. 10/31/23   [name], DO  ferrous sulfate 325 (65 FE) MG tablet Take 1 tablet (325 mg total) by mouth daily. 06/19/23 06/18/24  [name], DO  fluticasone (FLONASE) 50 MCG/ACT nasal spray Place 1 spray into both nostrils daily. Patient taking differently: Place 1 spray into both nostrils as needed for allergies. 03/22/23   Hoffman,  Erik C, DO  glucose blood test strip 1 each by Other route in the morning and at bedtime. 08/05/23   [provider]  insulin degludec (TRESIBA) 100 UNIT/ML FlexTouch Pen Inject 20 Units into the skin daily. 06/20/23   [name], DO  Insulin Pen Needle (PEN NEEDLES) 32G X 4 MM MISC 1 each by Does not apply route daily. 06/20/23   [name], DO  ipratropium-albuterol (DUONEB) 0.5-2.5 (3) MG/3ML SOLN Take 3 mLs by nebulization every 6 (six) hours as needed. 11/07/23 12/07/23  [name],  MD  meloxicam (MOBIC) 15 MG tablet Take 1 tablet (15 mg total) by mouth daily as needed for pain. 10/14/23   [name], DO  Mepolizumab (NUCALA) 100 MG/ML SOAJ Inject 1 mL (100 mg total) into the skin every 28 (twenty-eight) days. 08/15/23   [name], MD  montelukast (SINGULAIR) 10 MG tablet Take 1 tablet (10 mg total) by mouth at bedtime. 08/15/23   [name], MD  mupirocin ointment (BACTROBAN) 2 % Place 1 Application into the nose 2 (two) times daily. Patient not taking: Reported on 11/05/2023 09/21/23   Tawkaliyar, Roya, DO  polyethylene glycol powder (GLYCOLAX/MIRALAX) 17 GM/SCOOP powder Take 17 g by mouth daily. Patient taking differently: Take 17 g by mouth as needed for moderate constipation. 09/21/23   Tawkaliyar, Roya, DO  pregabalin (LYRICA) 100 MG capsule Take 1 capsule (100 mg total) by mouth 3 (three) times daily. 09/26/23   [name], DO  triamcinolone ointment (KENALOG) 0.5 % Apply 1 Application topically 2 (two) times daily. 10/31/23   [name], DO      Allergies    Shellfish allergy    Review of Systems   Review of Systems  Constitutional:  Negative for chills and fever.  HENT: Negative.    Respiratory:  Positive for cough, chest tightness and shortness of breath.   Cardiovascular:  Positive for chest pain.  Gastrointestinal:  Negative for nausea and vomiting.  All other systems reviewed and are negative.   Physical Exam Updated Vital Signs BP 123/77   Pulse 93   Temp 97.6 F (36.4 C) (Axillary)   Resp 19   SpO2 100%  Physical Exam Vitals and nursing note reviewed.  Constitutional:      General: He is in acute distress.     Appearance: Normal appearance. He is well-developed. He is not diaphoretic.     Comments: On arrival patient is alert but in some distress with labored breathing  HENT:     Head: Normocephalic and atraumatic.     Mouth/Throat:     Mouth: Mucous membranes are moist.     Pharynx: Oropharynx is clear. No pharyngeal  swelling.     Comments: Posterior oropharynx is clear, tolerating secretions, no edema noted. Eyes:     General:        Right eye: No discharge.        Left eye: No discharge.     Pupils: Pupils are equal, round, and reactive to light.  Cardiovascular:     Rate and Rhythm: Normal rate and regular rhythm.     Pulses: Normal pulses.     Heart sounds: Normal heart sounds.  Pulmonary:     Effort: Pulmonary effort is normal. No respiratory distress.     Breath sounds: Normal breath sounds. No wheezing or rales.     Comments: Respirations equal and unlabored, patient able to speak in full sentences, lungs clear to auscultation bilaterally  Abdominal:     General: Bowel sounds are normal. There is no distension.     Palpations: Abdomen is soft. There is no mass.     Tenderness: There is no abdominal tenderness. There is no guarding.     Comments: Abdomen soft, nondistended, nontender to palpation in all quadrants without guarding or peritoneal signs  Musculoskeletal:        General: No deformity.     Cervical back: Neck supple.  Skin:    General: Skin is warm and dry.     Capillary Refill: Capillary refill takes less than 2 seconds.  Neurological:     Mental Status: He is alert and oriented to person, place, and time.     Coordination: Coordination normal.     Comments: Speech is clear, able to follow commands CN III-XII intact Normal strength in upper and lower extremities bilaterally including dorsiflexion and plantar flexion, strong and equal grip strength Sensation normal to light and sharp touch Moves extremities without ataxia, coordination intact  Psychiatric:        Mood and Affect: Mood normal.        Behavior: Behavior normal.    ED Results / Procedures / Treatments   Labs (all labs ordered are listed, but only abnormal results are displayed) Labs Reviewed  BASIC METABOLIC PANEL - Abnormal; Notable for the following components:      Result Value   CO2 21 (*)     Glucose, Bld 168 (*)    All other components within normal limits  CBC WITH DIFFERENTIAL/PLATELET - Abnormal; Notable for the following components:   MCH 25.8 (*)    Lymphs Abs 0.5 (*)    All other components within normal limits  I-STAT ARTERIAL BLOOD GAS, ED - Abnormal; Notable for the following components:   pO2, Arterial 232 (*)    All other components within normal limits  RESP PANEL BY RT-PCR (RSV, FLU A&B, COVID)  RVPGX2  BRAIN NATRIURETIC PEPTIDE  TROPONIN I (HIGH SENSITIVITY)    EKG EKG Interpretation Date/Time:  Thursday November 28 2023 08:59:55 EST Ventricular Rate:  96 PR Interval:  171 QRS Duration:  90 QT Interval:  343 QTC Calculation: 434 R Axis:   32  Text Interpretation: Sinus rhythm Probable left atrial enlargement Abnormal R-wave progression, early transition ST elevation, consider inferior injury No acute changes When compared with ECG of 11/05/23 Confirmed by Anders Simmonds [phone]) on 11/28/2023 9:02:57 AM  Radiology DG Chest Portable 1 View Result Date: 11/28/2023 CLINICAL DATA:  Shortness of breath. EXAM: PORTABLE CHEST 1 VIEW COMPARISON:  November 05, 2023. FINDINGS: The heart size and mediastinal contours are within normal limits. Both lungs are clear. The visualized skeletal structures are unremarkable. IMPRESSION: No active disease. Electronically Signed   By: Lupita Raider M.D.   On: 11/28/2023 09:19    Procedures .Critical Care  Performed by: Rosezella Rumpf, PA-C Authorized by: Rosezella Rumpf, PA-C   Critical care provider statement:    Critical care time (minutes):  40   Critical care time was exclusive of:  Separately billable procedures and treating other patients and teaching time   Critical care was necessary to treat or prevent imminent or life-threatening deterioration of the following conditions:  Respiratory failure   Critical care was time spent personally by me on the following activities:  Blood draw for specimens, development of  treatment plan with patient or surrogate, discussions with consultants, evaluation of patient's response to treatment, examination of patient, obtaining history  from patient or surrogate, review of old charts, pulse oximetry, ordering and review of radiographic studies, ordering and review of laboratory studies and ordering and performing treatments and interventions   Care discussed with: admitting provider     {Document cardiac monitor, telemetry assessment procedure when appropriate:1}  Medications Ordered in ED Medications  magnesium sulfate IVPB 2 g 50 mL (2 g Intravenous New Bag/Given 11/28/23 0914)  ipratropium-albuterol (DUONEB) 0.5-2.5 (3) MG/3ML nebulizer solution 3 mL (3 mLs Nebulization Given 11/28/23 0900)  methylPREDNISolone sodium succinate (SOLU-MEDROL) 125 mg/2 mL injection 125 mg (125 mg Intravenous Given 11/28/23 0910)  albuterol (PROVENTIL) (2.5 MG/3ML) 0.083% nebulizer solution (10 mg/hr Nebulization Given 11/28/23 0919)    ED Course/ Medical Decision Making/ A&P Clinical Course as of 11/28/23 0954  Thu Nov 28, 2023  [id number] 63 year old male with history of COPD asthma here with increased shortness of breath since last night.  He is visibly distressed on presentation with some audible stridor.  Doing better on BiPAP.  Getting labs and imaging.  Disposition per results of testing [MB]    Clinical Course User Index [MB] Terrilee Files, MD   {   Click here for ABCD2, HEART and other calculatorsREFRESH Note before signing :1}                              Medical Decision Making Amount and/or Complexity of Data Reviewed Labs: ordered. Radiology: ordered.  Risk Prescription drug management. Decision regarding hospitalization.   63 y.o. male presents to the ED with complaints of ***, this involves an extensive number of treatment options, and is a complaint that carries with it a high risk of complications and morbidity.  The differential diagnosis includes *** (Ddx)  On  arrival pt is nontoxic, vitals ***. Exam significant for ***  Additional history obtained from ***. Previous records obtained and reviewed ***  I ordered medication *** for ***  Lab Tests:  I Ordered, reviewed, and interpreted labs, which included:   Imaging Studies ordered:  I ordered imaging studies which included ***, I independently visualized and interpreted imaging which showed ***  ED Course:   Critical interventions: BiPAP    I consulted internal medicine teaching service and discussed lab and imaging findings, they will see and admit the patient.    Portions of this note were generated with Scientist, clinical (histocompatibility and immunogenetics). Dictation errors may occur despite best attempts at proofreading.   {Document critical care time when appropriate:1} {Document review of labs and clinical decision tools ie heart score, Chads2Vasc2 etc:1}  {Document your independent review of radiology images, and any outside records:1} {Document your discussion with family members, caretakers, and with consultants:1} {Document social determinants of health affecting pt's care:1} {Document your decision making why or why not admission, treatments were needed:1} Final Clinical Impression(s) / ED Diagnoses Final diagnoses:  Respiratory distress  COPD exacerbation (HCC)  Asthma with acute exacerbation, unspecified asthma severity, unspecified whether persistent    Rx / DC Orders ED Discharge Orders     None

## 2023-11-28 NOTE — ED Triage Notes (Signed)
 Pt reports SHOB that started last night. Pt reports hx of COPD and asthma. Has taken 4 breathing treatments at home with minimal relief.

## 2023-11-28 NOTE — Progress Notes (Signed)
 Pt taken off BiPAP per pt request. ED provider aware.
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
## 2023-11-29 ENCOUNTER — Observation Stay (HOSPITAL_COMMUNITY): Payer: 59

## 2023-11-29 DIAGNOSIS — Z1152 Encounter for screening for COVID-19: Secondary | ICD-10-CM | POA: Diagnosis not present

## 2023-11-29 DIAGNOSIS — E1140 Type 2 diabetes mellitus with diabetic neuropathy, unspecified: Secondary | ICD-10-CM | POA: Diagnosis present

## 2023-11-29 DIAGNOSIS — I2510 Atherosclerotic heart disease of native coronary artery without angina pectoris: Secondary | ICD-10-CM | POA: Diagnosis present

## 2023-11-29 DIAGNOSIS — Z7902 Long term (current) use of antithrombotics/antiplatelets: Secondary | ICD-10-CM | POA: Diagnosis not present

## 2023-11-29 DIAGNOSIS — E1165 Type 2 diabetes mellitus with hyperglycemia: Secondary | ICD-10-CM | POA: Diagnosis present

## 2023-11-29 DIAGNOSIS — R0603 Acute respiratory distress: Secondary | ICD-10-CM | POA: Diagnosis present

## 2023-11-29 DIAGNOSIS — Z791 Long term (current) use of non-steroidal anti-inflammatories (NSAID): Secondary | ICD-10-CM | POA: Diagnosis not present

## 2023-11-29 DIAGNOSIS — Z87891 Personal history of nicotine dependence: Secondary | ICD-10-CM | POA: Diagnosis not present

## 2023-11-29 DIAGNOSIS — F17210 Nicotine dependence, cigarettes, uncomplicated: Secondary | ICD-10-CM | POA: Diagnosis present

## 2023-11-29 DIAGNOSIS — Z79899 Other long term (current) drug therapy: Secondary | ICD-10-CM | POA: Diagnosis not present

## 2023-11-29 DIAGNOSIS — Z8249 Family history of ischemic heart disease and other diseases of the circulatory system: Secondary | ICD-10-CM | POA: Diagnosis not present

## 2023-11-29 DIAGNOSIS — Z91013 Allergy to seafood: Secondary | ICD-10-CM | POA: Diagnosis not present

## 2023-11-29 DIAGNOSIS — D3709 Neoplasm of uncertain behavior of other specified sites of the oral cavity: Secondary | ICD-10-CM | POA: Diagnosis present

## 2023-11-29 DIAGNOSIS — J441 Chronic obstructive pulmonary disease with (acute) exacerbation: Secondary | ICD-10-CM | POA: Diagnosis not present

## 2023-11-29 DIAGNOSIS — J45901 Unspecified asthma with (acute) exacerbation: Secondary | ICD-10-CM | POA: Diagnosis not present

## 2023-11-29 DIAGNOSIS — Z794 Long term (current) use of insulin: Secondary | ICD-10-CM | POA: Diagnosis not present

## 2023-11-29 DIAGNOSIS — Z825 Family history of asthma and other chronic lower respiratory diseases: Secondary | ICD-10-CM | POA: Diagnosis not present

## 2023-11-29 DIAGNOSIS — D3705 Neoplasm of uncertain behavior of pharynx: Secondary | ICD-10-CM | POA: Diagnosis not present

## 2023-11-29 DIAGNOSIS — J018 Other acute sinusitis: Secondary | ICD-10-CM | POA: Diagnosis present

## 2023-11-29 DIAGNOSIS — I350 Nonrheumatic aortic (valve) stenosis: Secondary | ICD-10-CM | POA: Diagnosis present

## 2023-11-29 DIAGNOSIS — E1151 Type 2 diabetes mellitus with diabetic peripheral angiopathy without gangrene: Secondary | ICD-10-CM | POA: Diagnosis present

## 2023-11-29 DIAGNOSIS — D509 Iron deficiency anemia, unspecified: Secondary | ICD-10-CM | POA: Diagnosis present

## 2023-11-29 DIAGNOSIS — I10 Essential (primary) hypertension: Secondary | ICD-10-CM | POA: Diagnosis present

## 2023-11-29 DIAGNOSIS — M47819 Spondylosis without myelopathy or radiculopathy, site unspecified: Secondary | ICD-10-CM | POA: Diagnosis not present

## 2023-11-29 DIAGNOSIS — R079 Chest pain, unspecified: Secondary | ICD-10-CM | POA: Diagnosis not present

## 2023-11-29 DIAGNOSIS — E785 Hyperlipidemia, unspecified: Secondary | ICD-10-CM | POA: Diagnosis present

## 2023-11-29 DIAGNOSIS — J45909 Unspecified asthma, uncomplicated: Secondary | ICD-10-CM | POA: Diagnosis not present

## 2023-11-29 DIAGNOSIS — Z7982 Long term (current) use of aspirin: Secondary | ICD-10-CM | POA: Diagnosis not present

## 2023-11-29 DIAGNOSIS — Z8614 Personal history of Methicillin resistant Staphylococcus aureus infection: Secondary | ICD-10-CM | POA: Diagnosis not present

## 2023-11-29 LAB — COMPREHENSIVE METABOLIC PANEL
ALT: 26 U/L (ref 0–44)
AST: 22 U/L (ref 15–41)
Albumin: 3.4 g/dL — ABNORMAL LOW (ref 3.5–5.0)
Alkaline Phosphatase: 105 U/L (ref 38–126)
Anion gap: 11 (ref 5–15)
BUN: 18 mg/dL (ref 8–23)
CO2: 23 mmol/L (ref 22–32)
Calcium: 9.1 mg/dL (ref 8.9–10.3)
Chloride: 102 mmol/L (ref 98–111)
Creatinine, Ser: 0.94 mg/dL (ref 0.61–1.24)
GFR, Estimated: >60 mL/min (ref >60)
Glucose, Bld: 187 mg/dL — ABNORMAL HIGH (ref 70–99)
Potassium: 4.2 mmol/L (ref 3.5–5.1)
Sodium: 136 mmol/L (ref 135–145)
Total Bilirubin: 0.5 mg/dL (ref 0.0–1.2)
Total Protein: 6.9 g/dL (ref 6.5–8.1)

## 2023-11-29 LAB — ECHOCARDIOGRAM LIMITED
AR max vel: 2.49 cm2
AV Area VTI: 1.89 cm2
AV Area mean vel: 2.43 cm2
AV Mean grad: 12 mm[Hg]
AV Peak grad: 22.1 mm[Hg]
Ao pk vel: 2.35 m/s
Calc EF: 68.9 %
Height: 75 in
MV VTI: 2.97 cm2
S' Lateral: 3.2 cm
Single Plane A2C EF: 74.7 %
Single Plane A4C EF: 62.6 %
Weight: 3545.6 [oz_av]

## 2023-11-29 LAB — GLUCOSE, CAPILLARY
Glucose-Capillary: 178 mg/dL — ABNORMAL HIGH (ref 70–99)
Glucose-Capillary: 248 mg/dL — ABNORMAL HIGH (ref 70–99)
Glucose-Capillary: 418 mg/dL — ABNORMAL HIGH (ref 70–99)
Glucose-Capillary: 340 mg/dL — ABNORMAL HIGH (ref 70–99)
Glucose-Capillary: 231 mg/dL — ABNORMAL HIGH (ref 70–99)

## 2023-11-29 LAB — CBC
HCT: 41.3 % (ref 39.0–52.0)
Hemoglobin: 12.9 g/dL — ABNORMAL LOW (ref 13.0–17.0)
MCH: 25.5 pg — ABNORMAL LOW (ref 26.0–34.0)
MCHC: 31.2 g/dL (ref 30.0–36.0)
MCV: 81.8 fL (ref 80.0–100.0)
Platelets: 252 10*3/uL (ref 150–400)
RBC: 5.05 MIL/uL (ref 4.22–5.81)
RDW: 15.5 % (ref 11.5–15.5)
WBC: 6 10*3/uL (ref 4.0–10.5)
nRBC: 0 % (ref 0.0–0.2)

## 2023-11-29 MED ORDER — AZITHROMYCIN 500 MG PO TABS
500.0000 mg | ORAL_TABLET | Freq: Every day | ORAL | Status: AC
  Administered 2023-11-30: 500 mg via ORAL
  Filled 2023-11-29: qty 1

## 2023-11-29 MED ORDER — MENTHOL 3 MG MT LOZG: 1.0000 | LOZENGE | OROMUCOSAL | Status: DC | PRN

## 2023-11-29 MED ORDER — PHENOL 1.4 % MT LIQD
1.0000 | OROMUCOSAL | Status: DC | PRN
  Administered 2023-11-29: 1 via OROMUCOSAL
  Filled 2023-11-29: qty 177

## 2023-11-29 MED ORDER — ALBUTEROL SULFATE (2.5 MG/3ML) 0.083% IN NEBU
INHALATION_SOLUTION | RESPIRATORY_TRACT | Status: AC
  Filled 2023-11-29: qty 3

## 2023-11-29 MED ORDER — ALBUTEROL SULFATE (2.5 MG/3ML) 0.083% IN NEBU
2.5000 mg | INHALATION_SOLUTION | RESPIRATORY_TRACT | Status: DC | PRN
  Administered 2023-11-29 – 2023-11-30 (×3): 2.5 mg via RESPIRATORY_TRACT
  Filled 2023-11-29 (×2): qty 3

## 2023-11-29 MED ORDER — LIDOCAINE 5 % EX PTCH
1.0000 | MEDICATED_PATCH | CUTANEOUS | Status: DC
  Administered 2023-11-29 – 2023-11-30 (×2): 1 via TRANSDERMAL
  Filled 2023-11-29 (×5): qty 1

## 2023-11-29 MED ORDER — INSULIN ASPART 100 UNIT/ML IJ SOLN
0.0000 [IU] | Freq: Three times a day (TID) | INTRAMUSCULAR | Status: DC
  Administered 2023-11-30: 15 [IU] via SUBCUTANEOUS
  Administered 2023-11-30: 11 [IU] via SUBCUTANEOUS
  Administered 2023-11-30 – 2023-12-01 (×2): 7 [IU] via SUBCUTANEOUS
  Administered 2023-12-01: 20 [IU] via SUBCUTANEOUS
  Administered 2023-12-01: 4 [IU] via SUBCUTANEOUS
  Administered 2023-12-02: 7 [IU] via SUBCUTANEOUS
  Administered 2023-12-02: 11 [IU] via SUBCUTANEOUS
  Administered 2023-12-02: 4 [IU] via SUBCUTANEOUS
  Administered 2023-12-03: 7 [IU] via SUBCUTANEOUS
  Administered 2023-12-03: 15 [IU] via SUBCUTANEOUS
  Administered 2023-12-03: 4 [IU] via SUBCUTANEOUS
  Administered 2023-12-04: 11 [IU] via SUBCUTANEOUS

## 2023-11-29 MED ORDER — MELATONIN 3 MG PO TABS
3.0000 mg | ORAL_TABLET | Freq: Every evening | ORAL | Status: DC | PRN
  Administered 2023-11-29 – 2023-12-02 (×5): 3 mg via ORAL
  Filled 2023-11-29 (×5): qty 1

## 2023-11-29 MED ORDER — GUAIFENESIN-DM 100-10 MG/5ML PO SYRP
5.0000 mL | ORAL_SOLUTION | ORAL | Status: DC | PRN
  Administered 2023-11-29 – 2023-12-02 (×11): 5 mL via ORAL
  Filled 2023-11-29 (×12): qty 5

## 2023-11-29 NOTE — Progress Notes (Signed)
 Subjective:  Richard [name] is a 63 y.o. M with pertinent PMH of COPD, asthma, HTN, HLD, CAD, T2DM who presents with acute respiratory failure admitted for acute COPD/asthma exacerbation.   Today, he reports minimal improvement in respiratory status. He still endorses shortness of breath and wheezing. The tessalon pearls did not help him. He still reports chest pain with coughing.   I spoke to him regarding differentials for the inspiratory wheezing in the upper airway. We spoke about the possibility of inducible laryngeal obstruction. He reports that he has a chronic wheeze at baseline that is present when he breathes in and worsened with exertion.   Objective:  Vital signs in last 24 hours: Vitals:   11/29/23 0335 11/29/23 0509 11/29/23 0734 11/29/23 1121  BP:  123/71 122/65 130/74  Pulse:   87   Resp:  14    Temp:  97.9 F (36.6 C) 97.7 F (36.5 C) 97.9 F (36.6 C)  TempSrc:  Oral Oral Oral  SpO2: 96% 98%    Weight:      Height:       Physical Exam: General:NAD, sitting in bed  Cardiac:RRR, systolic murmur present, chest tenderness present to palpation  Pulmonary:Diffuse expiratory wheezes present, inspiratory wheeze present when patient is taking deep breaths not auscultated in lungs: possibly upper airway source Abdominal: soft, mild RUQ tenderness Neuro:awake, alert  Skin:warm and dry Psych:  normal mood and affect      Latest Ref Rng & Units 11/29/2023    2:17 AM 11/28/2023    9:40 AM 11/28/2023    9:01 AM  CBC  WBC 4.0 - 10.5 K/uL 6.0   4.5   Hemoglobin 13.0 - 17.0 g/dL 82.9  56.2  13.0   Hematocrit 39.0 - 52.0 % 41.3  40.0  42.8   Platelets 150 - 400 K/uL 252   246        Latest Ref Rng & Units 11/29/2023    2:17 AM 11/28/2023    9:40 AM 11/28/2023    9:01 AM  BMP  Glucose 70 - 99 mg/dL 865   784   BUN 8 - 23 mg/dL 18   16   Creatinine 6.96 - 1.24 mg/dL 2.95   2.84   Sodium 132 - 145 mmol/L 136  136  136   Potassium 3.5 - 5.1 mmol/L 4.2  4.1  4.2    Chloride 98 - 111 mmol/L 102   103   CO2 22 - 32 mmol/L 23   21   Calcium 8.9 - 10.3 mg/dL 9.1   9.1      Assessment/Plan:  Active Problems:   COPD exacerbation (HCC)  Acute COPD/asthma exacerbation Patient's respiratory status is improving. He has diffuse expiratory wheezes on exam and is saturating well on room air. He does have a pronounced inspiratory wheeze localized to the upper airway during exam, I was not able to appreciate inspiratory wheezes on pulmonary exam.  With patient's chronic inspiratory wheeze at baseline, I do wonder if he has inducible laryngeal obstruction.  Plan: -Continue formulary equivalent of home breztri 160-9-4.8 mcg 2 puff BID -Continue home singulair 10 mg daily -Prednisone 40 mg daily for 4 days beginning 02/21 -Azithromycin 500 will transition to oral, day 2/3  -DuoNeb q4h -Flonase 1 spray each nostril daily -Robitussin every four hours prn for cough  - Consider an outpatient ENT evaluation for inducible laryngeal obstruction if patient continues to improve, consider an inpatient ENT evaluation if patient's respiratory symptoms do not improve  by 2/22   Chest pain Pleuritic in nature with onset at time of increased cough and dyspnea overnight 02/19. Pain is most likely due to an MSK origin such as costochondritis from increase in cough. Cardiology was consulted for possible pericarditis with the ST segment changes on EKG, they have signed off.  -Lidocaine patch ordered    Type 2 diabetes mellitus HbA1c 7.9% 09/2023. Home regimen includes trulicity 4.5 mg weekly, synjardy 12.02-999 mg BID, tresiba 20 units daily Plan: -CBG and SSI TID with meals; goal CBG 140-180 -Continue home tresiba 20 units daily   HLD CAD Plan: -Continue home ASA 81 mg daily, atoravastatin 40 mg daily, plavix 75 mg daily   Neuropathy S/p L third toe amputation Plan: -Continue home lyrica 100 mg TID -Continue home naproxen 375 mg TID with meals PRN mild pain   Hx iron  deficiency anemia Hgb stable. Plan: -Continue home ferrous sulfate 325 mg daily     Resolved Problems:  __________________________________  Code Status: Full  VTE Prophylaxis: Xarelto  Diet:HH IVF:N/A Barriers to Discharge:Medical evaluation  Dispo: Anticipated discharge in approximately 1 day(s).   Faith Rogue, DO 11/29/2023, 1:42 PM Pager: [phone] After 5pm on weekdays and 1pm on weekends: On Call pager [phone]

## 2023-11-29 NOTE — Progress Notes (Signed)
 Patient placed on BIPAP due to increased work of breathing, PRN albuterol also given. Patient stated that breathing improved with bipap, RN notified.

## 2023-11-29 NOTE — Progress Notes (Signed)
 Mobility Specialist Progress Note;   11/29/23 1425  Mobility  Activity Ambulated with assistance in hallway  Level of Assistance Contact guard assist, steadying assist  Assistive Device Cane  Distance Ambulated (ft) 20 ft  Activity Response Tolerated fair  Mobility Referral Yes  Mobility visit 1 Mobility  Mobility Specialist Start Time (ACUTE ONLY) 1425  Mobility Specialist Stop Time (ACUTE ONLY) 1440  Mobility Specialist Time Calculation (min) (ACUTE ONLY) 15 min   Pt agreeable to mobility. Required MinG assistance throughout ambulation for safety. Displayed heavy SOB, requiring 1x seated rest break to recover, however all VSS. Pt returned safely back to room, sitting on EOB with all needs met. RN notified.   Caesar Bookman Mobility Specialist Please contact via SecureChat or [employer] [phone]

## 2023-11-29 NOTE — Progress Notes (Signed)
 Cardiology Progress Note  Patient ID: Richard [name] MRN: [id number] DOB: 1961-04-12 Date of Encounter: 11/29/2023 Primary Cardiologist: Tonny Bollman, MD  Subjective   Chief Complaint: CP/SOB/cough  HPI: Reports shortness of breath and chest pain with breathing.  Still with cough.  Wheezing noted.  ROS:  All other ROS reviewed and negative. Pertinent positives noted in the HPI.     Telemetry  Overnight telemetry shows SR 90s, which I personally reviewed.    Physical Exam   Vitals:   11/28/23 2341 11/29/23 0335 11/29/23 0509 11/29/23 0734  BP:   123/71 122/65  Pulse: 88   87  Resp: 20  14   Temp:   97.9 F (36.6 C) 97.7 F (36.5 C)  TempSrc:   Oral Oral  SpO2: 97% 96% 98%   Weight:      Height:        Intake/Output Summary (Last 24 hours) at 11/29/2023 1111 Last data filed at 11/29/2023 0735 Gross per 24 hour  Intake 490 ml  Output --  Net 490 ml       11/28/2023    6:04 PM 11/14/2023    8:48 AM 11/05/2023    1:20 PM  Last 3 Weights  Weight (lbs) 221 lb 9.6 oz 230 lb 1.6 oz 231 lb 0.7 oz  Weight (kg) 100.517 kg 104.373 kg 104.8 kg    Body mass index is 27.7 kg/m.  General: Ill-appearing Head: Atraumatic, normal size  Eyes: PEERLA, EOMI  Neck: Supple, no JVD Endocrine: No thryomegaly Cardiac: Normal S1, S2; RRR; no murmurs, rubs, or gallops Lungs: Wheezing and rhonchi noted bilaterally Abd: Soft, nontender, no hepatomegaly  Ext: No edema, pulses 2+ Musculoskeletal: No deformities, BUE and BLE strength normal and equal Skin: Warm and dry, no rashes   Neuro: Alert and oriented to person, place, time, and situation, CNII-XII grossly intact, no focal deficits  Psych: Normal mood and affect   Cardiac Studies  TTE 11/29/2023  1. Left ventricular ejection fraction, by estimation, is 60 to 65%. The  left ventricle has normal function. The left ventricle has no regional  wall motion abnormalities. There is mild asymmetric left ventricular  hypertrophy of the  basal-septal segment.  Left ventricular diastolic parameters are consistent with Grade I  diastolic dysfunction (impaired relaxation).   2. Right ventricular systolic function is normal. The right ventricular  size is normal. Tricuspid regurgitation signal is inadequate for assessing  PA pressure.   3. The mitral valve is abnormal, possibly rheumatic. Trivial mitral valve  regurgitation. Moderate mitral stenosis. The mean mitral valve gradient is  7.0 mmHg. Moderate to severe mitral annular calcification.   4. The aortic valve is tricuspid. There is moderate calcification of the  aortic valve. Aortic valve regurgitation is not visualized. Mild aortic  valve stenosis. Aortic valve area, by VTI measures 1.89 cm. Aortic valve  mean gradient measures 12.0 mmHg.   5. The inferior vena cava is dilated in size with <50% respiratory  variability, suggesting right atrial pressure of 15 mmHg.   Patient Profile  63 year old male with history of COPD, hypertension, diabetes, nonobstructive CAD, PAD, aortic stenosis who was admitted on 11/28/2023 for acute COPD exacerbation. Cardiology consulted for chest pain.   Assessment & Plan   # Chest pain, noncardiac -Admitted with COPD exacerbation.  Describes generalized malaise and fatigue.  Likely has an acute viral illness. -EKG nonischemic.  Cardiac enzymes are negative.  BNP is normal. -Echo shows normal LV function.  No significant pericardial effusion. -Symptoms are  pleuritic and worse with cough.  Describes congestion.  I think this is all related to COPD exacerbation and likely viral illness.  I do not believe this is acute pericarditis. -Continue with supportive care.  May need pulsed dose steroids.  I will defer this to the hospital team.  # Mild aortic stenosis -Unchanged.  [facility] will sign off.   Medication Recommendations: As above Other recommendations (labs, testing, etc): None Follow up as an outpatient: Regular follow-up  with Dr. Excell Seltzer  For questions or updates, please contact [facility] Please consult [email or web address] for contact info under    Signed, Gerri Spore T. Flora Lipps, MD, [facility] [place]  [facility]  11/29/2023 11:11 AM

## 2023-11-29 NOTE — Progress Notes (Signed)
  Echocardiogram 2D Echocardiogram has been performed.  Janalyn Harder 11/29/2023, 10:35 AM

## 2023-11-29 NOTE — Plan of Care (Signed)
  Problem: Fluid Volume: Goal: Ability to maintain a balanced intake and output will improve Outcome: Progressing   Problem: Nutritional: Goal: Maintenance of adequate nutrition will improve Outcome: Progressing Goal: Progress toward achieving an optimal weight will improve Outcome: Progressing   Problem: Skin Integrity: Goal: Risk for impaired skin integrity will decrease Outcome: Progressing   Problem: Tissue Perfusion: Goal: Adequacy of tissue perfusion will improve Outcome: Progressing   

## 2023-11-29 NOTE — Progress Notes (Signed)
   11/29/23 1538  Spiritual Encounters  Type of Visit Initial  Care provided to: Patient  Referral source Patient request  Reason for visit Routine spiritual support  OnCall Visit No   Visited with patient requesting prayer. Provided prayer, reflective listening and Bible.

## 2023-11-30 LAB — CBC
HCT: 39.7 % (ref 39.0–52.0)
Hemoglobin: 12.4 g/dL — ABNORMAL LOW (ref 13.0–17.0)
MCH: 25.6 pg — ABNORMAL LOW (ref 26.0–34.0)
MCHC: 31.2 g/dL (ref 30.0–36.0)
MCV: 81.9 fL (ref 80.0–100.0)
Platelets: 245 10*3/uL (ref 150–400)
RBC: 4.85 MIL/uL (ref 4.22–5.81)
RDW: 15.5 % (ref 11.5–15.5)
WBC: 5.1 10*3/uL (ref 4.0–10.5)
nRBC: 0 % (ref 0.0–0.2)

## 2023-11-30 LAB — BASIC METABOLIC PANEL
Anion gap: 12 (ref 5–15)
BUN: 15 mg/dL (ref 8–23)
CO2: 24 mmol/L (ref 22–32)
Calcium: 9.1 mg/dL (ref 8.9–10.3)
Chloride: 102 mmol/L (ref 98–111)
Creatinine, Ser: 0.89 mg/dL (ref 0.61–1.24)
GFR, Estimated: >60 mL/min (ref >60)
Glucose, Bld: 217 mg/dL — ABNORMAL HIGH (ref 70–99)
Potassium: 4.4 mmol/L (ref 3.5–5.1)
Sodium: 138 mmol/L (ref 135–145)

## 2023-11-30 LAB — GLUCOSE, CAPILLARY
Glucose-Capillary: 228 mg/dL — ABNORMAL HIGH (ref 70–99)
Glucose-Capillary: 275 mg/dL — ABNORMAL HIGH (ref 70–99)
Glucose-Capillary: 311 mg/dL — ABNORMAL HIGH (ref 70–99)
Glucose-Capillary: 487 mg/dL — ABNORMAL HIGH (ref 70–99)

## 2023-11-30 LAB — MAGNESIUM: Magnesium: 1.9 mg/dL (ref 1.7–2.4)

## 2023-11-30 MED ORDER — PANTOPRAZOLE SODIUM 40 MG PO TBEC
40.0000 mg | DELAYED_RELEASE_TABLET | Freq: Every day | ORAL | Status: DC
  Administered 2023-11-30 – 2023-12-04 (×5): 40 mg via ORAL
  Filled 2023-11-30 (×5): qty 1

## 2023-11-30 MED ORDER — IPRATROPIUM-ALBUTEROL 0.5-2.5 (3) MG/3ML IN SOLN
3.0000 mL | RESPIRATORY_TRACT | Status: DC
  Administered 2023-11-30 – 2023-12-02 (×10): 3 mL via RESPIRATORY_TRACT
  Filled 2023-11-30 (×11): qty 3

## 2023-11-30 MED ORDER — INSULIN ASPART 100 UNIT/ML IJ SOLN
0.0000 [IU] | Freq: Every day | INTRAMUSCULAR | Status: DC
Start: 2023-11-30 — End: 2023-12-04
  Administered 2023-12-01 – 2023-12-02 (×2): 5 [IU] via SUBCUTANEOUS
  Administered 2023-12-03: 4 [IU] via SUBCUTANEOUS

## 2023-11-30 MED ORDER — INSULIN GLARGINE-YFGN 100 UNIT/ML ~~LOC~~ SOLN
25.0000 [IU] | Freq: Every day | SUBCUTANEOUS | Status: DC
  Administered 2023-12-01: 25 [IU] via SUBCUTANEOUS
  Filled 2023-11-30 (×2): qty 0.25

## 2023-11-30 NOTE — Progress Notes (Signed)
 HD#1 SUBJECTIVE:  Patient Summary: Richard [name] is a 63 y.o. M with pertinent PMH of COPD, asthma, HTN, HLD, CAD, T2DM who presents with acute respiratory failure admitted for acute COPD/asthma exacerbation.   Overnight Events: NAEON.  Interim History: Reports some improvement since admission but reports struggling most during the night as far as breathing, cough is concerned. BiPAP last night did help his symptoms and he thinks this is part of why he is feeling a little better this morning. When he gets sick he struggles at night with his breathing and cough and it normally takes a few days to get better.  OBJECTIVE:  Vital Signs: Vitals:   11/29/23 2323 11/29/23 2334 11/30/23 0253 11/30/23 0533  BP: 119/74   107/64  Pulse: 92 94 92 100  Resp: 13  12 20   Temp: 97.7 F (36.5 C)   97.9 F (36.6 C)  TempSrc: Oral   Oral  SpO2: 100%   97%  Weight:      Height:       Supplemental O2: Room Air SpO2: 97 % FiO2 (%): 30 %  Filed Weights   11/28/23 1804  Weight: 100.5 kg     Intake/Output Summary (Last 24 hours) at 11/30/2023 0655 Last data filed at 11/29/2023 1810 Gross per 24 hour  Intake 960 ml  Output --  Net 960 ml   Net IO Since Admission: 1,210 mL [11/30/23 0655]  Physical Exam: Constitutional:Ill appearing gentleman, resting peacefully in bed in no acute distress. Cardio:Regular rate and rhythm. No murmurs, rubs, or gallops. Pulm:Diffuse inspiratory whistle and expiratory wheezing. Normal work of breathing on nasal cannula. ZOX:WRUEAVWU for extremity edema. Skin:Warm and dry. Neuro:Alert and oriented x3. No focal deficit noted. Psych:Pleasant mood and affect.  Patient Lines/Drains/Airways Status     Active Line/Drains/Airways     Name Placement date Placement time Site Days   Peripheral IV 11/28/23 18 G Right Antecubital 11/28/23  9811  Antecubital  2            ASSESSMENT/PLAN:  Assessment: Active Problems:   COPD exacerbation (HCC)   Asthma  with acute exacerbation  Richard [name] is a 63 y.o. M with pertinent PMH of COPD, asthma, HTN, HLD, CAD, T2DM who presents with acute respiratory failure admitted for acute COPD/asthma exacerbation.   Plan: Acute COPD/asthma exacerbation Subjectively improved since admission and compared to when I admitted him, he definitely sounds better on exam. He required BiPAP overnight due to cough and increased dyspnea which did help. I think inducible laryngeal obstruction/vocal cord dysfunction is a reasonable concern but do not suspect he needs urgent inpatient ENT evaluation for this. Plan: -Continue formulary equivalent of home breztri 160-9-4.8 mcg 2 puff BID -Continue home singulair 10 mg daily -Prednisone 40 mg daily, day 2/4 -Azithromycin 500 will transition to oral, day 3/3  -DuoNeb q4h -Flonase 1 spray each nostril daily -Robitussin every four hours prn for cough  -SLP consulted, follow-up recommendations -Pantoprazole 40 mg daily for possible laryngopharyngeal reflux is factor in current symptoms -Consider an outpatient ENT evaluation for inducible laryngeal obstruction   Chest pain Persists, mainly at night when his cough and breathing difficulties arise. Does not occur at other times.  Plan: -Lidocaine patch ordered  -Management of other concerns discussed elsewhere   Type 2 diabetes mellitus HbA1c 7.9% 09/2023. Home regimen includes trulicity 4.5 mg weekly, synjardy 12.02-999 mg BID, tresiba 20 units daily Plan: -CBG and SSI TID with meals; goal CBG 140-180 -Continue home tresiba 20 units daily  HLD CAD Plan: -Continue home ASA 81 mg daily, atoravastatin 40 mg daily, plavix 75 mg daily   Neuropathy S/p L third toe amputation Plan: -Continue home lyrica 100 mg TID -Continue home naproxen 375 mg TID with meals PRN mild pain   Hx iron deficiency anemia Hgb stable. Plan: -Continue home ferrous sulfate 325 mg daily  Best Practice: Diet: Regular diet IVF: None VTE:  rivaroxaban (XARELTO) tablet 10 mg Start: 11/28/23 1100 Code: Full AB: Azithromycin DISPO: Anticipated discharge to Home pending  medical stability .  Signature: Champ Mungo, D.O.  Internal Medicine Resident, PGY-3 [facility]    Please contact the on call pager after 5 pm and on weekends at [phone].

## 2023-11-30 NOTE — Progress Notes (Signed)
 SLP Cancellation Note  Patient Details Name: Richard [name] MRN: [id number] DOB: 02-Oct-1961   Cancelled treatment:       Reason Eval/Treat Not Completed: Medical issues which prohibited therapy Per RN, patient in respiratory distress and not appropriate for evaluation. SLP will follow for readiness.    Angela Nevin, MA, CCC-SLP Speech Therapy

## 2023-11-30 NOTE — Plan of Care (Signed)

## 2023-11-30 NOTE — Progress Notes (Signed)
 Mobility Specialist Progress Note;   11/30/23 0920  Mobility  Activity Ambulated with assistance in room  Level of Assistance Standby assist, set-up cues, supervision of patient - no hands on  Assistive Device Cane  Distance Ambulated (ft) 20 ft  Activity Response Tolerated fair  Mobility Referral Yes  Mobility visit 1 Mobility  Mobility Specialist Start Time (ACUTE ONLY) 0920  Mobility Specialist Stop Time (ACUTE ONLY) [id number]  Mobility Specialist Time Calculation (min) (ACUTE ONLY) 8 min   Pt eager for mobility. On RA upon arrival. Required no physical assistance during ambulation, SV. Pt stated breathing w/ activity feels improved from yesterday. Still displayed SOB/wheezing, however VSS throughout. Requested to ambulate again this afternoon, will f/us as schedule permits. Pt left sitting on EOB with all needs met.   Caesar Bookman Mobility Specialist Please contact via SecureChat or [employer] [phone]

## 2023-11-30 NOTE — Plan of Care (Signed)

## 2023-12-01 ENCOUNTER — Inpatient Hospital Stay (HOSPITAL_COMMUNITY): Payer: 59

## 2023-12-01 DIAGNOSIS — J45901 Unspecified asthma with (acute) exacerbation: Secondary | ICD-10-CM | POA: Diagnosis not present

## 2023-12-01 DIAGNOSIS — J441 Chronic obstructive pulmonary disease with (acute) exacerbation: Secondary | ICD-10-CM | POA: Diagnosis not present

## 2023-12-01 LAB — BASIC METABOLIC PANEL
Anion gap: 10 (ref 5–15)
Anion gap: 9 (ref 5–15)
BUN: 17 mg/dL (ref 8–23)
BUN: 19 mg/dL (ref 8–23)
CO2: 25 mmol/L (ref 22–32)
CO2: 26 mmol/L (ref 22–32)
Calcium: 8.9 mg/dL (ref 8.9–10.3)
Calcium: 8.9 mg/dL (ref 8.9–10.3)
Chloride: 100 mmol/L (ref 98–111)
Chloride: 101 mmol/L (ref 98–111)
Creatinine, Ser: 0.99 mg/dL (ref 0.61–1.24)
Creatinine, Ser: 0.99 mg/dL (ref 0.61–1.24)
GFR, Estimated: >60 mL/min (ref >60)
GFR, Estimated: >60 mL/min (ref >60)
Glucose, Bld: 288 mg/dL — ABNORMAL HIGH (ref 70–99)
Glucose, Bld: 320 mg/dL — ABNORMAL HIGH (ref 70–99)
Potassium: 4.6 mmol/L (ref 3.5–5.1)
Potassium: 4.8 mmol/L (ref 3.5–5.1)
Sodium: 135 mmol/L (ref 135–145)
Sodium: 136 mmol/L (ref 135–145)

## 2023-12-01 LAB — GLUCOSE, CAPILLARY
Glucose-Capillary: 240 mg/dL — ABNORMAL HIGH (ref 70–99)
Glucose-Capillary: 177 mg/dL — ABNORMAL HIGH (ref 70–99)
Glucose-Capillary: 395 mg/dL — ABNORMAL HIGH (ref 70–99)
Glucose-Capillary: 418 mg/dL — ABNORMAL HIGH (ref 70–99)

## 2023-12-01 LAB — GROUP A STREP BY PCR: Group A Strep by PCR: NOT DETECTED

## 2023-12-01 MED ORDER — IOHEXOL 350 MG/ML SOLN
75.0000 mL | Freq: Once | INTRAVENOUS | Status: AC | PRN
  Administered 2023-12-01: 75 mL via INTRAVENOUS

## 2023-12-01 MED ORDER — AMPICILLIN-SULBACTAM SODIUM 3 (2-1) G IJ SOLR
3.0000 g | Freq: Four times a day (QID) | INTRAMUSCULAR | Status: DC
  Administered 2023-12-01 – 2023-12-02 (×4): 3 g via INTRAVENOUS
  Filled 2023-12-01 (×4): qty 8
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
## 2023-12-01 NOTE — Progress Notes (Signed)
 RN called RT to bedside due to patient wheezing and short of breath even after breathing treatment. RT assessed patient and RN spoke with MD, RT placed patient on bipap per MD due to increased WOB. MD arrived bedside shortly after RT placed patient on bipap. Patient noticeably more comfortable on bipap. No new orders for RT at this time. RT will continue to monitor.

## 2023-12-01 NOTE — Plan of Care (Signed)
   Problem: Education: Goal: Knowledge of General Education information will improve Description: Including pain rating scale, medication(s)/side effects and non-pharmacologic comfort measures Outcome: Progressing   Problem: Clinical Measurements: Goal: Respiratory complications will improve Outcome: Progressing

## 2023-12-01 NOTE — Progress Notes (Signed)
 HD#2 SUBJECTIVE:  Patient Summary: Richard [name] is a 63 y.o. M with pertinent PMH of COPD, asthma, HTN, HLD, CAD, T2DM who presents with acute respiratory failure admitted for acute COPD/asthma exacerbation.   Overnight Events: NAEON.  Interim History: Continues to have spells where he coughs, has chest pain, then respiratory distress requiring BiPAP. Fortunately respiratory distress resolves quickly with BiPAP. Noted to have tonsillitis on exam today.  OBJECTIVE:  Vital Signs: Vitals:   11/30/23 2000 11/30/23 2327 11/30/23 2332 12/01/23 0322  BP:   (!) 140/84 (!) 143/76  Pulse: (!) 109  96 (!) 106  Resp: 17  19 (!) 24  Temp:   97.8 F (36.6 C) 98.4 F (36.9 C)  TempSrc:   Axillary Oral  SpO2: 100% 93% 100% 96%  Weight:      Height:       Supplemental O2: Room Air SpO2: 96 % FiO2 (%): 30 %  Filed Weights   11/28/23 1804  Weight: 100.5 kg    Intake/Output Summary (Last 24 hours) at 12/01/2023 0631 Last data filed at 11/30/2023 2011 Gross per 24 hour  Intake 1070 ml  Output 700 ml  Net 370 ml   Net IO Since Admission: 1,580 mL [12/01/23 0631]  Physical Exam: Constitutional:Ill appearing gentleman, resting peacefully in bed in no acute distress. HEENT: Neck is TTP around his throat. Tonsillar inflammation with exudate. Cardio: Tachycardia with regular rhythm. No murmurs, rubs, or gallops. Pulm:Notable for inspiratory whistle and poor air movement. On BiPAP with mild respiratory distress. Appropriate O2 saturations. ZOX:WRUEAVWU for extremity edema. Bilateral LE are nonpainful to palpation and there is no enlargement of the distal LE. Skin:Warm and dry. Neuro:Alert and oriented x3. No focal deficit noted. Psych:Pleasant mood and affect.  Patient Lines/Drains/Airways Status     Active Line/Drains/Airways     Name Placement date Placement time Site Days   Peripheral IV 11/28/23 18 G Right Antecubital 11/28/23  9811  Antecubital  2             ASSESSMENT/PLAN:  Assessment: Active Problems:   COPD exacerbation (HCC)   Asthma with acute exacerbation  My Madariaga is a 63 y.o. M with pertinent PMH of COPD, asthma, HTN, HLD, CAD, T2DM who presents with acute respiratory failure admitted for acute COPD/asthma exacerbation.   Plan: Acute COPD/asthma exacerbation Subjectively improved since admission and compared to when I admitted him, he definitely sounds better on exam. He continues to require BiPAP intermittently due to cough and increased dyspnea which does help however when the spells of cough-chest pain-dyspnea occur it takes a while to recover. I think inducible laryngeal obstruction/vocal cord dysfunction is a reasonable concern but do not suspect he needs urgent inpatient ENT evaluation for this. No overt abnormalities on swallowing evaluation/vocal cord dysfunction with SLP. Plan: -Continue formulary equivalent of home breztri 160-9-4.8 mcg 2 puff BID -Continue home singulair 10 mg daily -Prednisone 40 mg daily, day 3/4 -DuoNeb q4h -Flonase 1 spray each nostril daily -Robitussin every four hours prn for cough  -Pantoprazole 40 mg daily for possible laryngopharyngeal reflux is factor in current symptoms -Consider an outpatient ENT evaluation for inducible laryngeal obstruction  Tonsilitis Noted on exam today. He has had sore throat that started with his cough. He confirms that prior to admission he has not had fever, sick contacts. He denies new sexual partners and risk for oropharyngeal STD infection. Centor score is 0. Negative for strep pharyngitis. Plan: -Start unasyn, dosing per pharmacy -CT soft tissue of head and  neck to rule out abscess or deep tissue infection   Chest pain Persists, mainly at night when his cough and breathing difficulties arise. Does not occur at other times.  Plan: -Lidocaine patch ordered  -Management of other concerns discussed elsewhere   Type 2 diabetes mellitus HbA1c 7.9%  09/2023. Home regimen includes trulicity 4.5 mg weekly, synjardy 12.02-999 mg BID, tresiba 20 units daily. Hyperglycemia while receiving steroid treatment. Plan: -CBG and SSI TID with meals and bedtime; goal CBG 140-180 -Continue tresiba 25 units daily   HLD CAD Plan: -Continue home ASA 81 mg daily, atoravastatin 40 mg daily, plavix 75 mg daily   Neuropathy S/p L third toe amputation Plan: -Continue home lyrica 100 mg TID -Continue home naproxen 375 mg TID with meals PRN mild pain   Hx iron deficiency anemia Hgb stable. Plan: -Continue home ferrous sulfate 325 mg daily  Best Practice: Diet: Regular diet IVF: None VTE: rivaroxaban (XARELTO) tablet 10 mg Start: 11/28/23 1100 Code: Full AB: Azithromycin DISPO: Anticipated discharge to Home pending  medical stability .  Signature: Richard [name], D.O.  Internal Medicine Resident, PGY-3 [facility]    Please contact the on call pager after 5 pm and on weekends at [phone].

## 2023-12-02 DIAGNOSIS — D3705 Neoplasm of uncertain behavior of pharynx: Secondary | ICD-10-CM | POA: Diagnosis not present

## 2023-12-02 DIAGNOSIS — J441 Chronic obstructive pulmonary disease with (acute) exacerbation: Secondary | ICD-10-CM | POA: Diagnosis not present

## 2023-12-02 DIAGNOSIS — J45901 Unspecified asthma with (acute) exacerbation: Secondary | ICD-10-CM | POA: Diagnosis not present

## 2023-12-02 DIAGNOSIS — R0603 Acute respiratory distress: Principal | ICD-10-CM

## 2023-12-02 DIAGNOSIS — J45909 Unspecified asthma, uncomplicated: Secondary | ICD-10-CM | POA: Diagnosis not present

## 2023-12-02 DIAGNOSIS — Z87891 Personal history of nicotine dependence: Secondary | ICD-10-CM | POA: Diagnosis not present

## 2023-12-02 LAB — BASIC METABOLIC PANEL
Anion gap: 10 (ref 5–15)
BUN: 14 mg/dL (ref 8–23)
CO2: 28 mmol/L (ref 22–32)
Calcium: 9.2 mg/dL (ref 8.9–10.3)
Chloride: 100 mmol/L (ref 98–111)
Creatinine, Ser: 0.8 mg/dL (ref 0.61–1.24)
GFR, Estimated: >60 mL/min (ref >60)
Glucose, Bld: 261 mg/dL — ABNORMAL HIGH (ref 70–99)
Potassium: 4.2 mmol/L (ref 3.5–5.1)
Sodium: 138 mmol/L (ref 135–145)

## 2023-12-02 LAB — GLUCOSE, CAPILLARY
Glucose-Capillary: 293 mg/dL — ABNORMAL HIGH (ref 70–99)
Glucose-Capillary: 160 mg/dL — ABNORMAL HIGH (ref 70–99)
Glucose-Capillary: 223 mg/dL — ABNORMAL HIGH (ref 70–99)
Glucose-Capillary: 400 mg/dL — ABNORMAL HIGH (ref 70–99)

## 2023-12-02 LAB — CBC
HCT: 37.5 % — ABNORMAL LOW (ref 39.0–52.0)
Hemoglobin: 11.8 g/dL — ABNORMAL LOW (ref 13.0–17.0)
MCH: 25.8 pg — ABNORMAL LOW (ref 26.0–34.0)
MCHC: 31.5 g/dL (ref 30.0–36.0)
MCV: 81.9 fL (ref 80.0–100.0)
Platelets: 248 10*3/uL (ref 150–400)
RBC: 4.58 MIL/uL (ref 4.22–5.81)
RDW: 15.2 % (ref 11.5–15.5)
WBC: 5.1 10*3/uL (ref 4.0–10.5)
nRBC: 0 % (ref 0.0–0.2)

## 2023-12-02 MED ORDER — AMOXICILLIN-POT CLAVULANATE 875-125 MG PO TABS
1.0000 | ORAL_TABLET | Freq: Two times a day (BID) | ORAL | Status: DC
  Administered 2023-12-02 – 2023-12-04 (×5): 1 via ORAL
  Filled 2023-12-02 (×5): qty 1

## 2023-12-02 MED ORDER — INSULIN GLARGINE-YFGN 100 UNIT/ML ~~LOC~~ SOLN
30.0000 [IU] | Freq: Every day | SUBCUTANEOUS | Status: DC
  Administered 2023-12-02: 30 [IU] via SUBCUTANEOUS
  Filled 2023-12-02 (×2): qty 0.3

## 2023-12-02 MED ORDER — INSULIN GLARGINE 100 UNIT/ML ~~LOC~~ SOLN
30.0000 [IU] | Freq: Every day | SUBCUTANEOUS | Status: DC
  Administered 2023-12-03: 30 [IU] via SUBCUTANEOUS
  Filled 2023-12-02: qty 0.3

## 2023-12-02 MED ORDER — IPRATROPIUM-ALBUTEROL 0.5-2.5 (3) MG/3ML IN SOLN
3.0000 mL | Freq: Four times a day (QID) | RESPIRATORY_TRACT | Status: DC
  Administered 2023-12-02 (×2): 3 mL via RESPIRATORY_TRACT
  Filled 2023-12-02 (×2): qty 3

## 2023-12-02 MED ORDER — IPRATROPIUM-ALBUTEROL 0.5-2.5 (3) MG/3ML IN SOLN
3.0000 mL | RESPIRATORY_TRACT | Status: DC
  Administered 2023-12-02 – 2023-12-04 (×7): 3 mL via RESPIRATORY_TRACT
  Filled 2023-12-02 (×7): qty 3

## 2023-12-02 NOTE — Care Management Important Message (Signed)
 Important Message  Patient Details  Name: Richard [name] MRN: [id number] Date of Birth: [birth year]   Important Message Given:  Yes - Medicare IM     Dorena Bodo 12/02/2023, 3:08 PM

## 2023-12-02 NOTE — Progress Notes (Signed)
 Mobility Specialist Progress Note;   12/02/23 1108  Mobility  Activity Ambulated with assistance in hallway;Ambulated with assistance in room  Level of Assistance Contact guard assist, steadying assist  Assistive Device Cane  Distance Ambulated (ft) 20 ft  Activity Response Tolerated fair  Mobility Referral Yes  Mobility visit 1 Mobility  Mobility Specialist Start Time (ACUTE ONLY) 1108  Mobility Specialist Stop Time (ACUTE ONLY) 1118  Mobility Specialist Time Calculation (min) (ACUTE ONLY) 10 min   Pt eager for mobility. Required MinG assistance during ambulation for safety. Once returned back to room, pt began coughing which increased pts wheezing. Required seated rest break to recover. However, VSS on RA. No other c/o, recovered after a few minutes. Pt left sitting on EOB with all needs met. NT in room.   Caesar Bookman Mobility Specialist Please contact via SecureChat or [employer] [phone]
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
## 2023-12-02 NOTE — Progress Notes (Signed)
 Subjective:  Richard [name] is a 63 y.o. M with pertinent PMH of COPD, asthma, HTN, HLD, CAD, T2DM who presents with acute respiratory failure admitted for acute COPD/asthma exacerbation.   Today, patient continues to reports shortness of breath and coughing fits. The shortness of breath has improved minimally. We spoke about the results of the CT images and patient shared that he has smoked ~1/2 pack per day for 41 years now.    Objective:  Vital signs in last 24 hours: Vitals:   12/01/23 2312 12/01/23 2321 12/02/23 0531 12/02/23 0740  BP:  138/82 (!) 143/85 (!) 141/88  Pulse:  97 98 99  Resp:  18 19 (!) 21  Temp:  97.8 F (36.6 C) 98 F (36.7 C) 98 F (36.7 C)  TempSrc:  Oral Oral Oral  SpO2: 96% 96% 98% 99%  Weight:      Height:       Physical Exam: General:NAD, sitting in bed, appears anxious  HEENT: R tonsillar edema  Cardiac:Tachycardic, regular rhythm  Pulmonary:tachypnea, inspiratory wheeze from the upper airway, lungs are clear to auscultate bilaterally, coughing throughout exam   Neuro:awake and alert  MSK:no pitting edema appreciated  Skin:warm and dry  Psych:  normal mood and affect      Latest Ref Rng & Units 12/02/2023    2:23 AM 11/30/2023    2:15 AM 11/29/2023    2:17 AM  CBC  WBC 4.0 - 10.5 K/uL 5.1  5.1  6.0   Hemoglobin 13.0 - 17.0 g/dL 91.4  78.2  95.6   Hematocrit 39.0 - 52.0 % 37.5  39.7  41.3   Platelets 150 - 400 K/uL 248  245  252        Latest Ref Rng & Units 12/02/2023    2:23 AM 12/01/2023    2:21 AM 11/30/2023   11:48 PM  BMP  Glucose 70 - 99 mg/dL 213  086  578   BUN 8 - 23 mg/dL 14  17  19    Creatinine 0.61 - 1.24 mg/dL 4.69  6.29  5.28   Sodium 135 - 145 mmol/L 138  136  135   Potassium 3.5 - 5.1 mmol/L 4.2  4.6  4.8   Chloride 98 - 111 mmol/L 100  100  101   CO2 22 - 32 mmol/L 28  26  25    Calcium 8.9 - 10.3 mg/dL 9.2  8.9  8.9      Assessment/Plan:  Active Problems:   COPD exacerbation (HCC)   Asthma with acute  exacerbation  COPD/asthma exacerbation Persistent subjective shortness of breath R vallecula thickening  Patient's respiratory status is improving. He reports subjective shortness of breath. He continues to have a pronounced inspiratory wheeze localized to the upper airway during exam, I was not able to appreciate inspiratory wheezes on pulmonary exam.  CT images did reveal some asymmetrical thickening along the vallecula on the right that was also present on imaging in October. His COPD has been adequately treated.  Plan: -ENT consulted for further evaluation of patient's persistent symptoms and CT findings, appreciate their recommendations  -Continue formulary equivalent of home breztri 160-9-4.8 mcg 2 puff BID -Continue home singulair 10 mg daily -Completed prednisone and azithromycin treatment  -DuoNeb q4h -Flonase 1 spray each nostril daily -Robitussin every four hours prn for cough   Acute bacterial sinusitis  Will treat with Augmentin BID for a total of 7 days of antibiotic therapy, he has already received unasyn on 2/23.  Plan: -  Continue Augmentin, day 2/7    Chest pain Pleuritic in nature with onset at time of increased cough and dyspnea overnight 02/19. Pain is most likely due to an MSK origin such as costochondritis from increase in cough. -Lidocaine patches    Type 2 diabetes mellitus Patient's FBG was 293 this morning due to current steroid use. Plan: -CBG and resistant SSI TID with meals; goal CBG 140-180 -Increase Semglee to 30 units  -HS correction    HLD CAD Plan: -Continue home ASA 81 mg daily, atoravastatin 40 mg daily, plavix 75 mg daily   Neuropathy S/p L third toe amputation Plan: -Continue home lyrica 100 mg TID -Continue home naproxen 375 mg TID with meals PRN mild pain   Hx iron deficiency anemia Hgb stable. Plan: -Continue home ferrous sulfate 325 mg daily     Resolved Problems:  __________________________________  Code Status: Full  VTE  Prophylaxis: Xarelto  Diet:HH IVF:N/A Barriers to Discharge:Medical evaluation  Dispo: Anticipated discharge in approximately 1 day(s).   Faith Rogue, DO 12/02/2023, 10:44 AM Pager: [phone] After 5pm on weekdays and 1pm on weekends: On Call pager [phone]

## 2023-12-02 NOTE — Plan of Care (Signed)

## 2023-12-02 NOTE — TOC CM/SW Note (Signed)
 Transition of Care [facility]) - Inpatient Brief Assessment   Patient Details  Name: Richard [name] MRN: [id number] Date of Birth: May 31, 1961  Transition of Care [facility]) CM/SW Contact:    Harriet Masson, RN Phone Number: 12/02/2023, 1:48 PM   Clinical Narrative:  Patient admitted for his COPD/asthma exacerbation. No TOC needs at this time.  Transition of Care Asessment: Insurance and Status: Insurance coverage has been reviewed Patient has primary care physician: Yes Home environment has been reviewed: safe to discharge home when medically stable Prior level of function:: independent Prior/Current Home Services: No current home services Social Drivers of Health Review: SDOH reviewed no interventions necessary Readmission risk has been reviewed: Yes Transition of care needs: no transition of care needs at this time

## 2023-12-02 NOTE — Progress Notes (Signed)
 Patient in no distress at this time, bipap on standby

## 2023-12-02 NOTE — Plan of Care (Signed)
 Problem: Education: Goal: Ability to describe self-care measures that may prevent or decrease complications (Diabetes Survival Skills Education) will improve 12/02/2023 0700 by Excell Seltzer, Aziyah Provencal D, RN Outcome: Progressing 12/02/2023 0700 by Excell Seltzer, Marce Schartz D, RN Outcome: Progressing Goal: Individualized Educational Video(s) 12/02/2023 0700 by Excell Seltzer, Eldonna Neuenfeldt D, RN Outcome: Progressing 12/02/2023 0700 by Excell Seltzer, Zaniel Marineau D, RN Outcome: Progressing   Problem: Coping: Goal: Ability to adjust to condition or change in health will improve 12/02/2023 0700 by Excell Seltzer, Molleigh Huot D, RN Outcome: Progressing 12/02/2023 0700 by Excell Seltzer, Luella Gardenhire D, RN Outcome: Progressing   Problem: Fluid Volume: Goal: Ability to maintain a balanced intake and output will improve 12/02/2023 0700 by Excell Seltzer, Majid Mccravy D, RN Outcome: Progressing 12/02/2023 0700 by Excell Seltzer, Tinea Nobile D, RN Outcome: Progressing   Problem: Health Behavior/Discharge Planning: Goal: Ability to identify and utilize available resources and services will improve 12/02/2023 0700 by Excell Seltzer, Marianne Golightly D, RN Outcome: Progressing 12/02/2023 0700 by Excell Seltzer, Shayann Garbutt D, RN Outcome: Progressing Goal: Ability to manage health-related needs will improve 12/02/2023 0700 by Excell Seltzer, Akayla Brass D, RN Outcome: Progressing 12/02/2023 0700 by Excell Seltzer, Khali Albanese D, RN Outcome: Progressing   Problem: Metabolic: Goal: Ability to maintain appropriate glucose levels will improve 12/02/2023 0700 by Excell Seltzer, Ohanna Gassert D, RN Outcome: Progressing 12/02/2023 0700 by Excell Seltzer, Nethan Caudillo D, RN Outcome: Progressing   Problem: Nutritional: Goal: Maintenance of adequate nutrition will improve 12/02/2023 0700 by Excell Seltzer, Kimori Tartaglia D, RN Outcome: Progressing 12/02/2023 0700 by Excell Seltzer, Varvara Legault D, RN Outcome: Progressing Goal: Progress toward achieving an optimal weight will improve 12/02/2023 0700 by Excell Seltzer, Calvina Liptak D, RN Outcome: Progressing 12/02/2023 0700 by Excell Seltzer, Ellan Tess D, RN Outcome: Progressing   Problem: Skin  Integrity: Goal: Risk for impaired skin integrity will decrease 12/02/2023 0700 by Excell Seltzer, Kennia Vanvorst D, RN Outcome: Progressing 12/02/2023 0700 by Excell Seltzer, Vina Byrd D, RN Outcome: Progressing   Problem: Tissue Perfusion: Goal: Adequacy of tissue perfusion will improve 12/02/2023 0700 by Excell Seltzer, Beatrice Sehgal D, RN Outcome: Progressing 12/02/2023 0700 by Excell Seltzer, Phat Dalton D, RN Outcome: Progressing   Problem: Education: Goal: Knowledge of General Education information will improve Description: Including pain rating scale, medication(s)/side effects and non-pharmacologic comfort measures 12/02/2023 0700 by Excell Seltzer, Jakyrie Totherow D, RN Outcome: Progressing 12/02/2023 0700 by Excell Seltzer, Deletha Jaffee D, RN Outcome: Progressing   Problem: Health Behavior/Discharge Planning: Goal: Ability to manage health-related needs will improve 12/02/2023 0700 by Excell Seltzer, Hancel Ion D, RN Outcome: Progressing 12/02/2023 0700 by Excell Seltzer, Elzada Pytel D, RN Outcome: Progressing   Problem: Clinical Measurements: Goal: Ability to maintain clinical measurements within normal limits will improve 12/02/2023 0700 by Excell Seltzer, Yamilex Borgwardt D, RN Outcome: Progressing 12/02/2023 0700 by Excell Seltzer, Marwan Lipe D, RN Outcome: Progressing Goal: Will remain free from infection 12/02/2023 0700 by Excell Seltzer, Emitt Maglione D, RN Outcome: Progressing 12/02/2023 0700 by Excell Seltzer, Yobana Culliton D, RN Outcome: Progressing Goal: Diagnostic test results will improve 12/02/2023 0700 by Excell Seltzer, Tamberly Pomplun D, RN Outcome: Progressing 12/02/2023 0700 by Excell Seltzer, Keashia Haskins D, RN Outcome: Progressing Goal: Respiratory complications will improve 12/02/2023 0700 by Excell Seltzer, Lyzbeth Genrich D, RN Outcome: Progressing 12/02/2023 0700 by Excell Seltzer, Anan Dapolito D, RN Outcome: Progressing Goal: Cardiovascular complication will be avoided 12/02/2023 0700 by Excell Seltzer, Armoni Kludt D, RN Outcome: Progressing 12/02/2023 0700 by Excell Seltzer, Vihana Kydd D, RN Outcome: Progressing   Problem: Activity: Goal: Risk for activity intolerance will decrease 12/02/2023 0700 by Billey Gosling D, RN Outcome: Progressing 12/02/2023 0700 by Excell Seltzer, Castin Donaghue D, RN Outcome: Progressing   Problem: Nutrition: Goal: Adequate nutrition will be maintained 12/02/2023 0700 by Excell Seltzer, Lyric Hoar D, RN Outcome: Progressing 12/02/2023 0700 by Excell Seltzer, Shatasia Cutshaw D, RN Outcome: Progressing  Problem: Coping: Goal: Level of anxiety will decrease 12/02/2023 0700 by Excell Seltzer, Vestal Crandall D, RN Outcome: Progressing 12/02/2023 0700 by Excell Seltzer, Slate Debroux D, RN Outcome: Progressing   Problem: Elimination: Goal: Will not experience complications related to bowel motility 12/02/2023 0700 by Excell Seltzer, Diontre Harps D, RN Outcome: Progressing 12/02/2023 0700 by Excell Seltzer, Suzanne Garbers D, RN Outcome: Progressing Goal: Will not experience complications related to urinary retention 12/02/2023 0700 by Excell Seltzer, Heyden Jaber D, RN Outcome: Progressing 12/02/2023 0700 by Excell Seltzer, Tino Ronan D, RN Outcome: Progressing   Problem: Pain Managment: Goal: General experience of comfort will improve and/or be controlled 12/02/2023 0700 by Excell Seltzer, Shraga Custard D, RN Outcome: Progressing 12/02/2023 0700 by Excell Seltzer, Keondrick Dilks D, RN Outcome: Progressing   Problem: Safety: Goal: Ability to remain free from injury will improve 12/02/2023 0700 by Excell Seltzer, Maham Quintin D, RN Outcome: Progressing 12/02/2023 0700 by Excell Seltzer, Haneefah Venturini D, RN Outcome: Progressing   Problem: Skin Integrity: Goal: Risk for impaired skin integrity will decrease 12/02/2023 0700 by Excell Seltzer, Kerly Rigsbee D, RN Outcome: Progressing 12/02/2023 0700 by Excell Seltzer, Ceara Wrightson D, RN Outcome: Progressing

## 2023-12-03 ENCOUNTER — Ambulatory Visit: Payer: 59 | Admitting: Podiatry

## 2023-12-03 ENCOUNTER — Other Ambulatory Visit: Payer: Self-pay

## 2023-12-03 LAB — CBC
HCT: 37.6 % — ABNORMAL LOW (ref 39.0–52.0)
Hemoglobin: 12 g/dL — ABNORMAL LOW (ref 13.0–17.0)
MCH: 25.8 pg — ABNORMAL LOW (ref 26.0–34.0)
MCHC: 31.9 g/dL (ref 30.0–36.0)
MCV: 80.9 fL (ref 80.0–100.0)
Platelets: 266 10*3/uL (ref 150–400)
RBC: 4.65 MIL/uL (ref 4.22–5.81)
RDW: 14.9 % (ref 11.5–15.5)
WBC: 5.9 10*3/uL (ref 4.0–10.5)
nRBC: 0 % (ref 0.0–0.2)

## 2023-12-03 LAB — BASIC METABOLIC PANEL
Anion gap: 10 (ref 5–15)
BUN: 17 mg/dL (ref 8–23)
CO2: 28 mmol/L (ref 22–32)
Calcium: 8.6 mg/dL — ABNORMAL LOW (ref 8.9–10.3)
Chloride: 95 mmol/L — ABNORMAL LOW (ref 98–111)
Creatinine, Ser: 0.84 mg/dL (ref 0.61–1.24)
GFR, Estimated: >60 mL/min (ref >60)
Glucose, Bld: 291 mg/dL — ABNORMAL HIGH (ref 70–99)
Potassium: 4.4 mmol/L (ref 3.5–5.1)
Sodium: 133 mmol/L — ABNORMAL LOW (ref 135–145)

## 2023-12-03 LAB — GLUCOSE, CAPILLARY
Glucose-Capillary: 172 mg/dL — ABNORMAL HIGH (ref 70–99)
Glucose-Capillary: 248 mg/dL — ABNORMAL HIGH (ref 70–99)
Glucose-Capillary: 346 mg/dL — ABNORMAL HIGH (ref 70–99)
Glucose-Capillary: 317 mg/dL — ABNORMAL HIGH (ref 70–99)

## 2023-12-03 LAB — MAGNESIUM: Magnesium: 1.8 mg/dL (ref 1.7–2.4)

## 2023-12-03 MED ORDER — INSULIN GLARGINE 100 UNIT/ML ~~LOC~~ SOLN
25.0000 [IU] | Freq: Every day | SUBCUTANEOUS | Status: DC
  Administered 2023-12-04: 25 [IU] via SUBCUTANEOUS
  Filled 2023-12-03: qty 0.25

## 2023-12-03 MED ORDER — BENZONATATE 100 MG PO CAPS: 200.0000 mg | ORAL_CAPSULE | Freq: Three times a day (TID) | ORAL | Status: DC | PRN

## 2023-12-03 MED ORDER — HYDROCOD POLI-CHLORPHE POLI ER 10-8 MG/5ML PO SUER
5.0000 mL | Freq: Two times a day (BID) | ORAL | Status: DC
  Administered 2023-12-03 – 2023-12-04 (×3): 5 mL via ORAL
  Filled 2023-12-03 (×3): qty 5

## 2023-12-03 NOTE — Plan of Care (Signed)

## 2023-12-03 NOTE — Evaluation (Signed)
 Occupational Therapy Evaluation Patient Details Name: Richard [name] MRN: [id number] DOB: [birth year] Today's Date: 12/03/2023   History of Present Illness   Richard [name] is a 63 y.o. M admitted 11/28/23 for acute COPD/asthma exacerbation. CT neck revealed acute sinusitis. Pertinent PMH includes COPD, asthma, HTN, HLD, CAD, T2DM, recent L transmetatarsal amputation (09/18/23)     Clinical Impressions Pt is typically mod I for ADL and mobility. He has been recovering from his amputation since Dec 2024 and his mobility has progressed to darco shoe and SPC. Pt was able to perform UB and LB dressing, sink level grooming, transfers with SPC at [facility] due to line management. Pt stood at sink for approx 5-10 min for bathing, grooming - then required seated rest break due to SOB. VSS - HR up from mid 90's to mid 100's SpO2 > 90% on RA. Immediately upon sitting Pt had a coughing "fit" that lasted about 45 seconds, VSS afterwards - and no additional respiratory support needed to maintain vitals. Energy conservation education initiated, next session OT will provide handout and establish HEP to balance strengthening and activity tolerance with energy conservation. Do not anticipate the need for post-acute OT at this time.      If plan is discharge home, recommend the following:   Assistance with cooking/housework;Help with stairs or ramp for entrance     Functional Status Assessment   Patient has had a recent decline in their functional status and demonstrates the ability to make significant improvements in function in a reasonable and predictable amount of time.     Equipment Recommendations   None recommended by OT (Pt has appropriate DME)     Recommendations for Other Services   PT consult     Precautions/Restrictions   Precautions Precautions: Other (comment) Precaution/Restrictions Comments: darco shoe Required Braces or Orthoses: Other Brace Other Brace: darco  shoe Restrictions Weight Bearing Restrictions Per Provider Order: No     Mobility Bed Mobility Overal bed mobility: Independent                  Transfers Overall transfer level: Needs assistance Equipment used: Straight cane Transfers: Sit to/from Stand, Bed to chair/wheelchair/BSC Sit to Stand: Contact guard assist     Step pivot transfers: Contact guard assist     General transfer comment: for line management - progressing towards supervision      Balance Overall balance assessment: Mild deficits observed, not formally tested                                         ADL either performed or assessed with clinical judgement   ADL Overall ADL's : Needs assistance/impaired Eating/Feeding: Modified independent   Grooming: Oral care;Wash/dry face;Applying deodorant;Standing Grooming Details (indicate cue type and reason): sink level Upper Body Bathing: Contact guard assist;Standing Upper Body Bathing Details (indicate cue type and reason): sink level, afterwards coughing fit and needed to sit and catch breath for almost 5 min Lower Body Bathing: Supervison/ safety;Sitting/lateral leans   Upper Body Dressing : Set up   Lower Body Dressing: Set up Lower Body Dressing Details (indicate cue type and reason): donning shoes (both) Toilet Transfer: Contact guard assist;Ambulation (SPC)   Toileting- Clothing Manipulation and Hygiene: Contact guard assist;Sit to/from stand       Functional mobility during ADLs: Contact guard assist;Cane General ADL Comments: decreased activity tolerance, initiated energy conservation education.  Vision Baseline Vision/History: 1 Wears glasses Ability to See in Adequate Light: 0 Adequate Patient Visual Report: No change from baseline Vision Assessment?: No apparent visual deficits     Perception         Praxis         Pertinent Vitals/Pain Pain Assessment Pain Assessment: Faces Faces Pain Scale: Hurts  little more (during cough) Pain Location: chest with cough Pain Descriptors / Indicators: Discomfort, Grimacing Pain Intervention(s): Monitored during session     Extremity/Trunk Assessment Upper Extremity Assessment Upper Extremity Assessment: Overall WFL for tasks assessed   Lower Extremity Assessment Lower Extremity Assessment: Defer to PT evaluation   Cervical / Trunk Assessment Cervical / Trunk Assessment: Normal (tall 67ft3inches)   Communication Communication Communication: No apparent difficulties   Cognition Arousal: Alert Behavior During Therapy: WFL for tasks assessed/performed Cognition: No apparent impairments                               Following commands: Intact       Cueing  General Comments   Cueing Techniques: Verbal cues      Exercises     Shoulder Instructions      Home Living Family/patient expects to be discharged to:: Private residence Living Arrangements: Alone Available Help at Discharge: Family;Available PRN/intermittently Type of Home: House Home Access: Stairs to enter [employer] of Steps: 1 at back and 3 at front Entrance Stairs-Rails: None (at back) Home Layout: Two level;1/2 bath on main level;Bed/bath upstairs Alternate Level Stairs-Number of Steps: flight Alternate Level Stairs-Rails: Right Bathroom Shower/Tub: Chief Strategy Officer: Standard Bathroom Accessibility: Yes How Accessible: Accessible via walker Home Equipment: Cane - single point;BSC/3in1;Grab bars - tub/shower;Wheelchair - manual;Grab bars - toilet;Hand held Stage manager (4 wheels)   Additional Comments: son readily available, has church family      Prior Functioning/Environment Prior Level of Function : Independent/Modified Independent             Mobility Comments: SPC ADLs Comments: mod I, harder recently, but he does his own cooking/cleaning    OT Problem List: Decreased activity tolerance;Decreased  knowledge of use of DME or AE;Cardiopulmonary status limiting activity   OT Treatment/Interventions: Self-care/ADL training;Energy conservation;DME and/or AE instruction;Therapeutic activities;Patient/family education      OT Goals(Current goals can be found in the care plan section)   Acute Rehab OT Goals Patient Stated Goal: figure out what is going on OT Goal Formulation: With patient Time For Goal Achievement: 12/17/23 Potential to Achieve Goals: Good ADL Goals Pt Will Perform Grooming: with modified independence;standing Pt Will Perform Upper Body Dressing: with modified independence;sitting Pt Will Perform Lower Body Dressing: with modified independence;sit to/from stand Pt Will Transfer to Toilet: with modified independence;ambulating Pt Will Perform Toileting - Clothing Manipulation and hygiene: with modified independence;sit to/from stand Pt/caregiver will Perform Home Exercise Program: Both right and left upper extremity;With theraband;Independently;With written HEP provided Additional ADL Goal #1: Pt will recall at least 3 ways to conserve energy during ADL with no cues   OT Frequency:  Min 1X/week    Co-evaluation              AM-PAC OT "6 Clicks" Daily Activity     Outcome Measure Help from another person eating meals?: None Help from another person taking care of personal grooming?: A Little Help from another person toileting, which includes using toliet, bedpan, or urinal?: A Little Help from another person bathing (  including washing, rinsing, drying)?: A Little Help from another person to put on and taking off regular upper body clothing?: None Help from another person to put on and taking off regular lower body clothing?: None 6 Click Score: 21   End of Session Equipment Utilized During Treatment: Gait belt [facility]) Nurse Communication: Mobility status;Precautions  Activity Tolerance: Patient tolerated treatment well Patient left: in chair;with call  bell/phone within reach;Other (comment) (Chaplain present)  OT Visit Diagnosis: Other (comment);Muscle weakness (generalized) (M62.81) (cardiopulmonary activity limiting status)                Time: 1610-9604 OT Time Calculation (min): 29 min Charges:  OT General Charges $OT Visit: 1 Visit OT Evaluation $OT Eval Moderate Complexity: 1 Mod OT Treatments $Self Care/Home Management : 8-22 mins  Nyoka Cowden OTR/L Acute Rehabilitation Services Office: [phone]   Evern [facility] 12/03/2023, 10:07 AM

## 2023-12-03 NOTE — Progress Notes (Signed)
 SLP Note  Patient Details Name: Richard [name] MRN: [id number] DOB: 07-24-1961   Cancelled treatment:        Spoke briefly with pt who is tolerating diet well per RN and pt report.  His voice was hoarse on arrival, but strong and improved in clarity during conversation.  ENT evaluation was unrevealing: "Oropharyngeal walls were symmetric and mobile without lesion, mass, or edema.  Hypopharynx was also without  lesion or edema.  Larynx was mobile without lesions.  No lesions or mass in the supraglottic larynx or vallecula.  Arytenoid mucosa was normal.  Posterior commissure with mild edema.  True vocal folds were pale yellow, mobile, and without mass or lesion." Pt is still experiencing symptoms, but does noat have any further ST needs at this time.  Spoke with medical team.  SLP will sign off.    Kerrie Pleasure, MA, CCC-SLP Acute Rehabilitation Services Office: [phone] 12/03/2023, 10:14 AM

## 2023-12-03 NOTE — Progress Notes (Signed)
   12/03/23 2329  BiPAP/CPAP/SIPAP  Reason BIPAP/CPAP not in use Other(comment) (bipap order prn, not indicated, patient also does not wear at home)

## 2023-12-03 NOTE — Progress Notes (Signed)
   12/03/23 0930  Spiritual Encounters  Type of Visit Follow up  Care provided to: Patient  Conversation partners present during encounter Nurse  Reason for visit Routine spiritual support  OnCall Visit No   Follow up visit with patient. Reflective listening. Patient concerned of possible discharge without knowing "what is going on with him", says he was previously treated for the same thing and wants to see a specialist. Patient doesn't want to leave and then get sick again and have to come back in worst condition. Patient is comforted by his faith. Provided care through reflective listening and prayer.

## 2023-12-03 NOTE — TOC Initial Note (Signed)
 Transition of Care [facility]) - Initial/Assessment Note    Patient Details  Name: Richard [name] MRN: [id number] Date of Birth: Mar 30, 1961  Transition of Care [facility]) CM/SW Contact:    Lawerance Sabal, RN Phone Number: 12/03/2023, 11:52 AM  Clinical Narrative:                 [name] w patient at bedside. He states that he lives alone. Has RW and cane at home. No HH needs identified. Denies barriers to care or medications. He will have family transport home   Expected Discharge Plan: Home/Self Care Barriers to Discharge: Continued Medical Work up   Patient Goals and CMS Choice Patient states their goals for this hospitalization and ongoing recovery are:: to go home          Expected Discharge Plan and Services                                              Prior Living Arrangements/Services                       Activities of Daily Living   ADL Screening (condition at time of admission) Independently performs ADLs?: Yes (appropriate for developmental age) Is the patient deaf or have difficulty hearing?: No Does the patient have difficulty seeing, even when wearing glasses/contacts?: No Does the patient have difficulty concentrating, remembering, or making decisions?: No  Permission Sought/Granted                  Emotional Assessment              Admission diagnosis:  Respiratory distress [R06.03] COPD exacerbation (HCC) [J44.1] Asthma with acute exacerbation, unspecified asthma severity, unspecified whether persistent [J45.901] Patient Active Problem List   Diagnosis Date Noted   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   Respiratory distress 12/02/2023   Asthma with acute exacerbation 11/29/2023   COPD exacerbation (HCC) 11/28/2023   Costochondritis 11/06/2023   Acute exacerbation of COPD with asthma (HCC) 11/05/2023   MDD (major depressive disorder), recurrent episode, severe (HCC) 10/31/2023   S/P transmetatarsal amputation of  foot, left (HCC) 10/31/2023   Carpal tunnel syndrome of right wrist 10/31/2023   PAD (peripheral artery disease) (HCC) 09/18/2023   Ulcer of toe, left, with necrosis of bone (HCC) 09/18/2023   Osteomyelitis (HCC) 09/17/2023   Neuropathic ulcer of foot, unspecified laterality, limited to breakdown of skin (HCC) 09/10/2023   Asthma exacerbation 09/04/2023   Tobacco use disorder 04/06/2023   Iron deficiency anemia 03/21/2023   Eczema 11/22/2022   Anxiety 09/04/2022   Elevated TSH 08/17/2021   Moderate aortic stenosis 03/02/2021   Mild mitral stenosis by prior echocardiogram 03/02/2021   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   Impingement syndrome of right shoulder 01/29/2020   Mediastinal mass 08/25/2018   Osteoarthritis of left knee 01/09/2018   Peripheral arterial disease (HCC) 10/21/2017   Erectile dysfunction associated with type 2 diabetes mellitus (HCC) 03/06/2017   Abdominal aortic atherosclerosis (HCC) 01/18/2017   Vitamin D deficiency 07/02/2016   Tubular adenoma of colon 02/06/2016   Diabetic neuropathy, painful (HCC) 11/30/2015   Overweight (BMI 25.0-29.9) 11/30/2015   CAD in native artery 10/20/2015   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    Essential hypertension    Type 2 diabetes mellitus with  peripheral artery disease (HCC)    Asthma-COPD overlap syndrome (HCC)    PCP:  [name], DO Pharmacy:   [facility] STORE [phone] - [place], [place] - [address] AT [facility] [address] [place] [address] Phone: [phone] Fax: [phone]  [facility] [address] [place] [address] Phone: [phone] Fax: [phone]  [facility] #[id number], [place] - [address] AT [facility] [address] [place] [address] Phone: [phone] Fax: [phone]  [facility] #[id number] - [place], [place] - [address] AT [facility] OF [address] &  [address] [place] [address] Phone: [phone] Fax: [phone]     Social Drivers of Health (SDOH) Social History: SDOH Screenings   Food Insecurity: No Food Insecurity (11/28/2023)  Housing: Low Risk  (11/28/2023)  Transportation Needs: No Transportation Needs (11/28/2023)  Utilities: Not At Risk (11/28/2023)  Alcohol Screen: Low Risk  (09/25/2023)  Depression (PHQ2-9): Low Risk  (11/14/2023)  Financial Resource Strain: Low Risk  (09/25/2023)  Physical Activity: Sufficiently Active (09/25/2023)  Social Connections: Moderately Isolated (09/25/2023)  Stress: No Stress Concern Present (09/25/2023)  Tobacco Use: Medium Risk (11/28/2023)  Health Literacy: Adequate Health Literacy (09/25/2023)   SDOH Interventions:     Readmission Risk Interventions    09/06/2023   10:25 AM  Readmission Risk Prevention Plan  Post Dischage Appt Complete  Medication Screening Complete  Transportation Screening Complete

## 2023-12-03 NOTE — Progress Notes (Addendum)
 Subjective:  Richard [name] is a 63 y.o. M with pertinent PMH of COPD, asthma, HTN, HLD, CAD, T2DM who presents with acute respiratory failure admitted for acute COPD/asthma exacerbation.   Today, patient expresses concern of these "coughing attacks" that have been present since November. He has fear not being unable to catch his breath during these. He has never seen a pulmonologist. He is interested in pulmonary rehab and trying tussionex.    Objective:  Vital signs in last 24 hours: Vitals:   12/03/23 0305 12/03/23 0340 12/03/23 0801 12/03/23 0844  BP: (!) 134/97  (!) 148/87   Pulse: 95  94   Resp: 16  20   Temp: 97.8 F (36.6 C)  98.4 F (36.9 C)   TempSrc: Oral  Oral   SpO2: 96% 97% 97% 98%  Weight:      Height:       Physical Exam: General:NAD, sitting in the chair  Pulmonary:Normal effort on room air, coughing throughout exam, NO desaturation appreciated during conversation, at rest, or while he was coughing  Neuro:awake, alert  Skin:warm and dry  Psych:  anxious      Latest Ref Rng & Units 12/03/2023    1:41 AM 12/02/2023    2:23 AM 11/30/2023    2:15 AM  CBC  WBC 4.0 - 10.5 K/uL 5.9  5.1  5.1   Hemoglobin 13.0 - 17.0 g/dL 56.2  13.0  86.5   Hematocrit 39.0 - 52.0 % 37.6  37.5  39.7   Platelets 150 - 400 K/uL 266  248  245        Latest Ref Rng & Units 12/03/2023    1:41 AM 12/02/2023    2:23 AM 12/01/2023    2:21 AM  BMP  Glucose 70 - 99 mg/dL 784  696  295   BUN 8 - 23 mg/dL 17  14  17    Creatinine 0.61 - 1.24 mg/dL 2.84  1.32  4.40   Sodium 135 - 145 mmol/L 133  138  136   Potassium 3.5 - 5.1 mmol/L 4.4  4.2  4.6   Chloride 98 - 111 mmol/L 95  100  100   CO2 22 - 32 mmol/L 28  28  26    Calcium 8.9 - 10.3 mg/dL 8.6  9.2  8.9      Assessment/Plan:  Active Problems:   COPD exacerbation (HCC)   Asthma with acute exacerbation   Neoplasm of uncertain behavior of vallecula   Respiratory distress  COPD/asthma exacerbation, resolved  Coughing attacks   Patient's respiratory status is improving. He reports coughing fits that have not improved with the robitussin. His COPD has been adequately treated. Will start tussionex for cough suppressant. Patient is medically ready for discharge.  Plan: -Continue formulary equivalent of home breztri 160-9-4.8 mcg 2 puff BID -Continue home singulair 10 mg daily -DuoNeb q4h -Flonase 1 spray each nostril daily -Robitussin every four hours prn for cough  -Tussionex for cough   Acute bacterial sinusitis  Will treat with Augmentin BID for a total of 7 days of antibiotic therapy, he has already received unasyn on 2/23.  Plan: -Continue Augmentin, day 3/7    Chest pain Pleuritic in nature with onset at time of increased cough and dyspnea overnight 02/19. Pain is most likely due to an MSK origin such as costochondritis from increase in cough. -Lidocaine patches    Type 2 diabetes mellitus Patient's FBG was 172 this morning due to current steroid use, will likely  be able to decrease the insulin amount to her home regimen. Plan: -CBG and resistant SSI TID with meals; goal CBG 140-180 -Continue Semglee to 30 units, will transition to 25 units tomorrow now that the prednisone course is finished  -HS correction    HLD CAD Plan: -Continue home ASA 81 mg daily, atoravastatin 40 mg daily, plavix 75 mg daily   Neuropathy S/p L third toe amputation Plan: -Continue home lyrica 100 mg TID -Continue home naproxen 375 mg TID with meals PRN mild pain   Hx iron deficiency anemia Hgb stable. Plan: -Continue home ferrous sulfate 325 mg daily     Resolved Problems:  __________________________________  Code Status: Full  VTE Prophylaxis: Xarelto  Diet:HH IVF:N/A Barriers to Discharge:Medical evaluation  Dispo: Anticipated discharge in approximately 1 day(s).   Faith Rogue, DO 12/03/2023, 11:47 AM Pager: [phone] After 5pm on weekdays and 1pm on weekends: On Call pager [phone]

## 2023-12-03 NOTE — Evaluation (Signed)
 Physical Therapy Evaluation Patient Details Name: Richard [name] MRN: [id number] DOB: 03/05/1961 Today's Date: 12/03/2023  History of Present Illness  Richard [name] is a 63 y.o. M admitted 11/28/23 for acute COPD/asthma exacerbation. CT neck revealed acute sinusitis. Pertinent PMH includes COPD, asthma, HTN, HLD, CAD, T2DM, recent L transmetatarsal amputation (09/18/23)   Clinical Impression  Pt admitted with above. Pt trialed ambulation with RW this date for energy conservation however despite SPO2 >94% on RA pt with coughing spell limiting ambulation and noted wheezing. Pt did amb 100' this date with rolling walker compared to 78' with [facility] yesterday with mobility specialist however continues to have decreased activity tolerance due to respiratory status. PT to follow acutely however I do not anticipate pt to need follow up PT upon d/c.        If plan is discharge home, recommend the following: Help with stairs or ramp for entrance   Can travel by private vehicle        Equipment Recommendations None recommended by PT (pt has RW)  Recommendations for Other Services       Functional Status Assessment Patient has had a recent decline in their functional status and/or demonstrates limited ability to make significant improvements in function in a reasonable and predictable amount of time     Precautions / Restrictions Precautions Precautions: Other (comment) Precaution/Restrictions Comments: darco shoe Required Braces or Orthoses: Other Brace Other Brace: darco shoe Restrictions Weight Bearing Restrictions Per Provider Order: No Other Position/Activity Restrictions: per pt, to minimize weight on his left foot      Mobility  Bed Mobility Overal bed mobility: Independent             General bed mobility comments: HOB elevated    Transfers Overall transfer level: Needs assistance Equipment used: Rolling walker (2 wheels) Transfers: Sit to/from Stand, Bed to  chair/wheelchair/BSC Sit to Stand: Contact guard assist           General transfer comment: contact guard for line management, verbal cues for hand placement    Ambulation/Gait Ambulation/Gait assistance: Contact guard assist Gait Distance (Feet): 100 Feet Assistive device: Rolling walker (2 wheels) Gait Pattern/deviations: Step-through pattern, Decreased stride length Gait velocity: dec Gait velocity interpretation: <1.31 ft/sec, indicative of household ambulator   General Gait Details: no cues needed, used RW for energy conservation. SPO2 >94% HR 104bpm, noted wheezing, began coughing, requiring standing rest breaks  Stairs            Wheelchair Mobility     Tilt Bed    Modified Rankin (Stroke Patients Only)       Balance Overall balance assessment: Mild deficits observed, not formally tested                                           Pertinent Vitals/Pain Pain Assessment Pain Assessment: Faces Faces Pain Scale: Hurts little more (during cough) Pain Location: chest with cough Pain Descriptors / Indicators: Discomfort, Grimacing Pain Intervention(s): Monitored during session    Home Living Family/patient expects to be discharged to:: Private residence Living Arrangements: Alone Available Help at Discharge: Family;Available PRN/intermittently (son staying with him currently, however he works during the day) Type of Home: House Home Access: Stairs to enter Entrance Stairs-Rails: None (at back) Secretary/administrator of Steps: 1 at back and 3 at front Alternate Level Stairs-Number of Steps: flight Home Layout: Two level;1/2 bath on  main level;Bed/bath upstairs Home Equipment: Cane - single point;BSC/3in1;Grab bars - tub/shower;Wheelchair - manual;Grab bars - toilet;Hand held Stage manager (4 wheels) Additional Comments: son readily available, has church family    Prior Function Prior Level of Function : Independent/Modified  Independent             Mobility Comments: SPC, was going to [employer] school ADLs Comments: mod I, harder recently, but he does his own cooking/cleaning     Extremity/Trunk Assessment   Upper Extremity Assessment Upper Extremity Assessment: Overall WFL for tasks assessed    Lower Extremity Assessment Lower Extremity Assessment: Overall WFL for tasks assessed    Cervical / Trunk Assessment Cervical / Trunk Assessment: Normal  Communication   Communication Communication: No apparent difficulties    Cognition Arousal: Alert Behavior During Therapy: WFL for tasks assessed/performed                             Following commands: Intact       Cueing Cueing Techniques: Verbal cues     General Comments General comments (skin integrity, edema, etc.): SpO2 >94% on RA even during coughing fit, HR at 104bpm, RR 24    Exercises     Assessment/Plan    PT Assessment Patient needs continued PT services  PT Problem List Decreased activity tolerance;Decreased balance;Decreased mobility       PT Treatment Interventions      PT Goals (Current goals can be found in the Care Plan section)  Acute Rehab PT Goals Patient Stated Goal: home today PT Goal Formulation: With patient Time For Goal Achievement: 12/17/23 Potential to Achieve Goals: Good    Frequency       Co-evaluation               AM-PAC PT "6 Clicks" Mobility  Outcome Measure Help needed turning from your back to your side while in a flat bed without using bedrails?: None Help needed moving from lying on your back to sitting on the side of a flat bed without using bedrails?: None Help needed moving to and from a bed to a chair (including a wheelchair)?: None Help needed standing up from a chair using your arms (e.g., wheelchair or bedside chair)?: None Help needed to walk in hospital room?: A Little Help needed climbing 3-5 steps with a railing? : A Lot 6 Click Score: 21    End of  Session   Activity Tolerance: Patient limited by fatigue;Other (comment) (coughing) Patient left: in bed;with call bell/phone within reach (RT present to give breathing treatment) Nurse Communication: Mobility status;Other (comment) (sats with mobility) PT Visit Diagnosis: Other abnormalities of gait and mobility (R26.89)    Time: 4098-1191 PT Time Calculation (min) (ACUTE ONLY): 20 min   Charges:   PT Evaluation $PT Eval Low Complexity: 1 Low   PT General Charges $$ ACUTE PT VISIT: 1 Visit         Lewis Shock, PT, DPT Acute Rehabilitation Services Secure chat preferred Office #: [phone]   Iona Hansen 12/03/2023, 11:05 AM

## 2023-12-04 ENCOUNTER — Other Ambulatory Visit (HOSPITAL_COMMUNITY): Payer: Self-pay

## 2023-12-04 ENCOUNTER — Ambulatory Visit (HOSPITAL_BASED_OUTPATIENT_CLINIC_OR_DEPARTMENT_OTHER): Payer: 59 | Admitting: General Surgery

## 2023-12-04 DIAGNOSIS — D3705 Neoplasm of uncertain behavior of pharynx: Secondary | ICD-10-CM | POA: Diagnosis not present

## 2023-12-04 DIAGNOSIS — J45909 Unspecified asthma, uncomplicated: Secondary | ICD-10-CM | POA: Diagnosis not present

## 2023-12-04 DIAGNOSIS — Z87891 Personal history of nicotine dependence: Secondary | ICD-10-CM | POA: Diagnosis not present

## 2023-12-04 DIAGNOSIS — J441 Chronic obstructive pulmonary disease with (acute) exacerbation: Secondary | ICD-10-CM | POA: Diagnosis not present

## 2023-12-04 LAB — GLUCOSE, CAPILLARY: Glucose-Capillary: 267 mg/dL — ABNORMAL HIGH (ref 70–99)

## 2023-12-04 MED ORDER — IPRATROPIUM-ALBUTEROL 0.5-2.5 (3) MG/3ML IN SOLN
3.0000 mL | Freq: Four times a day (QID) | RESPIRATORY_TRACT | 0 refills | Status: DC | PRN
  Filled 2023-12-04: qty 360, 30d supply, fill #0

## 2023-12-04 MED ORDER — PHENOL 1.4 % MT LIQD
1.0000 | OROMUCOSAL | 0 refills | Status: DC | PRN
  Filled 2023-12-04: qty 20, fill #0

## 2023-12-04 MED ORDER — LIDOCAINE 5 % EX PTCH
1.0000 | MEDICATED_PATCH | CUTANEOUS | 0 refills | Status: DC
  Filled 2023-12-04: qty 30, 30d supply, fill #0

## 2023-12-04 MED ORDER — MENTHOL 3 MG MT LOZG
1.0000 | LOZENGE | OROMUCOSAL | 12 refills | Status: DC | PRN
  Filled 2023-12-04: qty 100, fill #0

## 2023-12-04 MED ORDER — ALBUTEROL SULFATE (2.5 MG/3ML) 0.083% IN NEBU
2.5000 mg | INHALATION_SOLUTION | Freq: Two times a day (BID) | RESPIRATORY_TRACT | 5 refills | Status: DC
  Filled 2023-12-04 – 2024-04-03 (×2): qty 90, 15d supply, fill #0
  Filled 2024-04-11 – 2024-04-15 (×2): qty 90, 15d supply, fill #1
  Filled 2024-04-27: qty 90, 15d supply, fill #2
  Filled 2024-05-11: qty 90, 15d supply, fill #3
  Filled 2024-05-22: qty 90, 15d supply, fill #4
  Filled 2024-05-25 (×2): qty 90, 15d supply, fill #0
  Filled 2024-05-25: qty 90, 15d supply, fill #4
  Filled 2024-05-26: qty 90, 15d supply, fill #0

## 2023-12-04 MED ORDER — ALBUTEROL SULFATE HFA 108 (90 BASE) MCG/ACT IN AERS
1.0000 | INHALATION_SPRAY | RESPIRATORY_TRACT | 0 refills | Status: DC | PRN
  Filled 2023-12-04: qty 6.7, 25d supply, fill #0

## 2023-12-04 MED ORDER — PANTOPRAZOLE SODIUM 40 MG PO TBEC
40.0000 mg | DELAYED_RELEASE_TABLET | Freq: Every day | ORAL | 0 refills | Status: DC
  Filled 2023-12-04: qty 30, 30d supply, fill #0

## 2023-12-04 MED ORDER — AMOXICILLIN-POT CLAVULANATE 875-125 MG PO TABS
1.0000 | ORAL_TABLET | Freq: Two times a day (BID) | ORAL | 0 refills | Status: DC
  Filled 2023-12-04: qty 7, 4d supply, fill #0

## 2023-12-04 MED ORDER — HYDROCOD POLI-CHLORPHE POLI ER 10-8 MG/5ML PO SUER
5.0000 mL | Freq: Two times a day (BID) | ORAL | 0 refills | Status: DC
  Filled 2023-12-04: qty 70, 7d supply, fill #0

## 2023-12-04 NOTE — Progress Notes (Signed)
   12/04/23 0900  Spiritual Encounters  Type of Visit Follow up  Care provided to: Patient  Conversation partners present during encounter Nurse  Reason for visit Routine spiritual support  OnCall Visit No   Patient being discharged, discussed discharge concerns, patient was provided an appointment to follow up with a specialist and adequate medication until follow-up visit.

## 2023-12-04 NOTE — Discharge Instructions (Signed)
 You came to the hospital for difficulty breathing and you were diagnosed with a COPD exacerbation.  We treated you with steroids and antibiotics.    *We have started you on the following medications  -Augmentin, take one tablet twice a day (first dose is this evening)  -Protonix 40 mg, take this once a day  -Tussionex, take 5 mL every 12 hours for cough suppressant  -Lidocaine patches, place one patch on chest every day  -Phenol also know as chloraseptic, one spray in the mouth or throat as needed for throat irritation / pain   *It is very important that you schedule an appointment with the Lung doctor!  *Please schedule a follow up with Dr. Suszanne Conners   Follow-up appointments: Please visit your family doctor in 7 to 10 days  If you have any questions or concerns please feel free to call: Internal medicine clinic at [phone]   If you have any of these following symptoms, please call us or seek care at an emergency department: -Chest Pain -Difficulty Breathing -Syncope (passing out) -Drooping of face -Slurred speech -Sudden weakness in your leg or arm -Fever -Chills   We are glad that you are feeling better, it was a pleasure to care for you!  Faith Rogue DO

## 2023-12-04 NOTE — Consult Note (Signed)
 [facility] Liaison Consult Note   12/04/2023  Richard [name] 1960-11-24 [id number]  Insurance: [place] [employer] Medicare Dual Complete   Primary Care Provider: Leone Haven, [facility], this provider is listed for the transition of care follow up appointments  and [facility] calls   [facility] Liaison transitioned home and provided call attempt to reach patient and was able to leave a HIPAA appropriate voicemail message requesting a return call.    The patient was screened for less than 30 day readmission hospitalization with noted high risk score for unplanned readmission risk 3 ED visits and 4 hospital admissions in 6 months.  The patient was assessed for potential Community Care Coordination service needs for post hospital transition for care coordination. Review of patient's electronic medical record reveals patient is for home.   Plan: [facility] Liaison will continue to follow progress and disposition to asess for post hospital community care coordination/management needs.  Referral request for community care coordination: Await a return call.  Anticipate TOC follow up for unplanned readmission risk prevention needs.   [facility] Community Care, Population Health does not replace or interfere with any arrangements made by the Inpatient Transition of Care team.   For questions contact:   Charlesetta Shanks, RN, BSN, CCM [place]  [facility], [facility] Liaison Direct Dial: [phone] or secure chat Email: [email or web address]

## 2023-12-04 NOTE — Plan of Care (Signed)

## 2023-12-04 NOTE — Discharge Summary (Signed)
 Name: Richard [name] MRN: [id number] DOB: 1961/06/28 63 y.o. PCP: [name], DO  Date of Admission: 11/28/2023  8:53 AM Date of Discharge: 12/04/2023 8:48 AM Attending Physician: Dr. Antony Contras  Discharge Diagnosis: Active Problems:   COPD exacerbation (HCC)   Asthma with acute exacerbation   Neoplasm of uncertain behavior of vallecula   Respiratory distress    Discharge Medications: Allergies as of 12/04/2023       Reactions   Shellfish Allergy Anaphylaxis, Shortness Of Breath        Medication List     TAKE these medications    Accu-Chek Softclix Lancets lancets 1 each by Other route See admin instructions. 1-2 times daily   albuterol (2.5 MG/3ML) 0.083% nebulizer solution Commonly known as: PROVENTIL Take 3 mLs (2.5 mg total) by nebulization 2 (two) times daily.   albuterol 108 (90 Base) MCG/ACT inhaler Commonly known as: VENTOLIN HFA Inhale 1-2 puffs into the lungs every 4 (four) hours as needed for wheezing or shortness of breath.   amoxicillin-clavulanate 875-125 MG tablet Commonly known as: AUGMENTIN Take 1 tablet by mouth every 12 (twelve) hours.   aspirin EC 81 MG tablet Take 1 tablet (81 mg total) by mouth daily.   atorvastatin 40 MG tablet Commonly known as: LIPITOR Take 1 tablet (40 mg total) by mouth daily.   benzonatate 200 MG capsule Commonly known as: TESSALON Take 1 capsule (200 mg total) by mouth 3 (three) times daily as needed for cough.   Breztri Aerosphere 160-9-4.8 MCG/ACT Aero Generic drug: Budeson-Glycopyrrol-Formoterol Inhale 2 puffs into the lungs in the morning and at bedtime.   chlorpheniramine-HYDROcodone 10-8 MG/5ML Commonly known as: TUSSIONEX Take 5 mLs by mouth every 12 (twelve) hours.   clopidogrel 75 MG tablet Commonly known as: PLAVIX Take 1 tablet (75 mg total) by mouth daily.   escitalopram 5 MG tablet Commonly known as: Lexapro Take 1 tablet (5 mg total) by mouth daily.   ferrous sulfate 325 (65 FE) MG  tablet Take 1 tablet (325 mg total) by mouth daily.   fluticasone 50 MCG/ACT nasal spray Commonly known as: FLONASE Place 1 spray into both nostrils daily. What changed:  when to take this reasons to take this   FreeStyle Libre 3 Sensor Misc Place 1 sensor on the skin every 14 days. Use to check glucose continuously   glucose blood test strip 1 each by Other route in the morning and at bedtime.   insulin degludec 100 UNIT/ML FlexTouch Pen Commonly known as: TRESIBA Inject 20 Units into the skin daily.   ipratropium-albuterol 0.5-2.5 (3) MG/3ML Soln Commonly known as: DUONEB Use 1 vial (3 mLs) by nebulization every 6 (six) hours as needed.   lidocaine 5 % Commonly known as: LIDODERM Place 1 patch onto the skin daily. Remove & Discard patch within 12 hours or as directed by MD   meloxicam 15 MG tablet Commonly known as: MOBIC Take 1 tablet (15 mg total) by mouth daily as needed for pain.   menthol-cetylpyridinium 3 MG lozenge Commonly known as: CEPACOL Take 1 lozenge (3 mg total) by mouth as needed for sore throat.   montelukast 10 MG tablet Commonly known as: SINGULAIR Take 1 tablet (10 mg total) by mouth at bedtime.   mupirocin ointment 2 % Commonly known as: BACTROBAN Place 1 Application into the nose 2 (two) times daily.   Nucala 100 MG/ML Soaj Generic drug: Mepolizumab Inject 1 mL (100 mg total) into the skin every 28 (twenty-eight) days.   pantoprazole  40 MG tablet Commonly known as: PROTONIX Take 1 tablet (40 mg total) by mouth daily.   Pen Needles 32G X 4 MM Misc 1 each by Does not apply route daily.   phenol 1.4 % Liqd Commonly known as: CHLORASEPTIC Use as directed 1 spray in the mouth or throat as needed for throat irritation / pain.   polyethylene glycol powder 17 GM/SCOOP powder Commonly known as: GLYCOLAX/MIRALAX Take 17 g by mouth daily. What changed:  when to take this reasons to take this   pregabalin 100 MG capsule Commonly known as:  LYRICA Take 1 capsule (100 mg total) by mouth 3 (three) times daily.   Synjardy 12.02-999 MG Tabs Generic drug: Empagliflozin-metFORMIN HCl Take 1 tablet by mouth in the morning and at bedtime.   triamcinolone ointment 0.5 % Commonly known as: KENALOG Apply 1 Application topically 2 (two) times daily.   Trulicity 4.5 MG/0.5ML Soaj Generic drug: Dulaglutide Inject 4.5 mg as directed once a week.        Disposition and follow-up:   Mr.Elster Carcamo was discharged from [facility] in Good condition.  At the hospital follow up visit please address:  1.  Follow-up:  *COPD *Asthma *Post viral cough  -he was discharged home without supplemental oxygen  -Encourage patient to follow with pulmonology -Encourage patient to go to pulmonology rehab -Assess symptoms -Note that patient was provided with a one week course of tussionex    *T2DM -Assess patient's home glucose readings -Note that he was sent home on his home regimen without insulin adjustments    *Right vallecula thickening on CT imaging  -Encourage patient to follow up with ENT in one month    *Acute bacterial sinusitis  -Assess if patient finished course of Augmentin    2.  Labs / imaging needed at time of follow-up: N/A  3.  Pending labs/ test needing follow-up: N/A  4.  Medication Changes  STOPPED  -N/A   ADDED  -Augmentin BID -Protonix 40 mg daily  -Tussionex, take 5 mL every 12 hours for cough suppressant  -Lidocaine patches -Phenol also know as chloraseptic   MODIFIED  -N/A    Follow-up Appointments:  Follow-up Information     Newman Pies, MD. Schedule an appointment as soon as possible for a visit in 1 month(s).   Specialty: Otolaryngology Contact information: [address] [place] [address] [phone]         Martina Sinner, MD. Schedule an appointment as soon as possible for a visit in 2 week(s).   Specialty: Pulmonary Disease Contact  information: [address] [place] [address] [phone]                 Hospital Course by problem list:  Acute COPD/asthma exacerbation Negative for COVID-19, influenza, and RSV. RVP was negative. He has been afebrile without leukocytosis, decreasing suspicion for bacterial infection or bacterial pneumonia; CXR unremarkable. He does have a pronounced inspiratory wheeze localized to the upper airway during exam, I was not able to appreciate inspiratory wheezes on pulmonary exam.  With patient's chronic inspiratory wheeze at baseline, I do wonder if he has inducible laryngeal obstruction. ENT evaluated the patient and did not appreciate vocal cord abnormalities on laryngoscopy. Patient's persistent cough was thought to be due to a post viral cough. His cough improved after using tussionex.   Right vallecula thickening on CT imaging  Patient was evaluated by ENT with laryngoscopy and reported no obvious  mass or lesion is noted on today's fiberoptic laryngoscopy and physical examination. He will need to follow up in one month.    Acute bacterial sinusitis  Patient will complete a 7 day course of antibiotics.    Chest pain Pleuritic in nature with onset at time of increased cough and dyspnea overnight 02/19. Pain is most likely due to an MSK origin such as costochondritis from increase in cough. Cardiology was consulted for possible pericarditis with the ST segment changes on EKG, they have signed off. Pleuritic in nature with onset at time of increased cough and dyspnea overnight 02/19. Pain is most likely due to an MSK origin such as costochondritis from increase in cough. Cardiology was consulted for possible pericarditis with the ST segment changes on EKG, they have signed off.   Type 2 diabetes mellitus HbA1c 7.9% 09/2023. Home regimen includes trulicity 4.5 mg weekly, synjardy 12.02-999 mg BID, tresiba 20 units daily. He required additional; insulin throughout  hospitalization due to prednisone use.   HLD CAD No evidence of cardiac involvement of current presentation. Continued home ASA 81 mg daily, atoravastatin 40 mg daily, plavix 75 mg daily   Neuropathy S/p L third toe amputation Continued home lyrica 100 mg TID and naproxen 375 mg TID with meals PRN mild pain   Hx iron deficiency anemia Continued home ferrous sulfate 325 mg daily   Discharge Subjective: Patient reports feeling well today and is ready to be discharged home. His cough has improved with the tussionex. He understands the importance of following up with both pulmonology and ENT.   Discharge Exam:   Blood pressure 139/81, pulse 92, temperature 98.7 F (37.1 C), temperature source Oral, resp. rate 18, height 6\' 3"  (1.905 m), weight 100.5 kg, SpO2 95%.  Constitutional:Well-appearing, sitting in bed, in no acute distress Cardiovascular: regular rhythm,tachycardia at 102 on telemetry  no m/r/g Pulmonary/Chest: normal work of breathing on room air, lungs clear to auscultation bilaterally. No crackles, No coughing during exam, speaking in full sentences  Neurological: alert & awake, participating in conversation.  Skin: warm and dry Psych: Normal mood and affect  Pertinent Labs, Studies, and Procedures:     Latest Ref Rng & Units 12/03/2023    1:41 AM 12/02/2023    2:23 AM 11/30/2023    2:15 AM  CBC  WBC 4.0 - 10.5 K/uL 5.9  5.1  5.1   Hemoglobin 13.0 - 17.0 g/dL 16.1  09.6  04.5   Hematocrit 39.0 - 52.0 % 37.6  37.5  39.7   Platelets 150 - 400 K/uL 266  248  245        Latest Ref Rng & Units 12/03/2023    1:41 AM 12/02/2023    2:23 AM 12/01/2023    2:21 AM  CMP  Glucose 70 - 99 mg/dL 409  811  914   BUN 8 - 23 mg/dL 17  14  17    Creatinine 0.61 - 1.24 mg/dL 7.82  9.56  2.13   Sodium 135 - 145 mmol/L 133  138  136   Potassium 3.5 - 5.1 mmol/L 4.4  4.2  4.6   Chloride 98 - 111 mmol/L 95  100  100   CO2 22 - 32 mmol/L 28  28  26    Calcium 8.9 - 10.3 mg/dL 8.6  9.2  8.9      ECHOCARDIOGRAM LIMITED Result Date: 11/29/2023    ECHOCARDIOGRAM LIMITED REPORT   Patient Name:   KALI AMBLER Date of Exam: 11/29/2023 Medical Rec #:  [id number]       Height:       75.0 in Accession #:    [id number]      Weight:       221.6 lb Date of Birth:  1960/10/27        BSA:          2.292 m Patient Age:    62 years        BP:           122/65 mmHg Patient Gender: M               HR:           94 bpm. Exam Location:  Inpatient Procedure: Limited Echo, Cardiac Doppler and Color Doppler (Both Spectral and            Color Flow Doppler were utilized during procedure). Indications:    R07.9* Chest pain, unspecified  History:        Patient has prior history of Echocardiogram examinations, most                 recent 10/08/2023. COPD; Risk Factors:Diabetes and Former                 Smoker. Mitral stenosis. Aortic stenosis. Cancer.  Sonographer:    Sheralyn Boatman RDCS Referring Phys: [id number] Jonita Albee  Sonographer Comments: Limited echo for chest pain. IMPRESSIONS  1. Left ventricular ejection fraction, by estimation, is 60 to 65%. The left ventricle has normal function. The left ventricle has no regional wall motion abnormalities. There is mild asymmetric left ventricular hypertrophy of the basal-septal segment. Left ventricular diastolic parameters are consistent with Grade I diastolic dysfunction (impaired relaxation).  2. Right ventricular systolic function is normal. The right ventricular size is normal. Tricuspid regurgitation signal is inadequate for assessing PA pressure.  3. The mitral valve is abnormal, possibly rheumatic. Trivial mitral valve regurgitation. Moderate mitral stenosis. The mean mitral valve gradient is 7.0 mmHg. Moderate to severe mitral annular calcification.  4. The aortic valve is tricuspid. There is moderate calcification of the aortic valve. Aortic valve regurgitation is not visualized. Mild aortic valve stenosis. Aortic valve area, by VTI measures 1.89 cm. Aortic valve  mean gradient measures 12.0 mmHg.  5. The inferior vena cava is dilated in size with <50% respiratory variability, suggesting right atrial pressure of 15 mmHg. FINDINGS  Left Ventricle: Left ventricular ejection fraction, by estimation, is 60 to 65%. The left ventricle has normal function. The left ventricle has no regional wall motion abnormalities. There is mild asymmetric left ventricular hypertrophy of the basal-septal segment. Left ventricular diastolic parameters are consistent with Grade I diastolic dysfunction (impaired relaxation). Right Ventricle: The right ventricular size is normal. Right ventricular systolic function is normal. Tricuspid regurgitation signal is inadequate for assessing PA pressure. Pericardium: Trivial pericardial effusion is present. Mitral Valve: The mitral valve is abnormal. There is moderate calcification of the mitral valve leaflet(s). Moderate to severe mitral annular calcification. Trivial mitral valve regurgitation. Moderate mitral valve stenosis. MV peak gradient, 13.0 mmHg. The mean mitral valve gradient is 7.0 mmHg. Tricuspid Valve: The tricuspid valve is not well visualized. Tricuspid valve regurgitation is not demonstrated. Aortic Valve: The aortic valve is tricuspid. There is moderate calcification of the aortic valve. Aortic valve regurgitation is not visualized. Mild aortic stenosis is present. Aortic valve mean gradient measures 12.0 mmHg. Aortic valve peak gradient measures 22.1 mmHg. Aortic valve area, by VTI measures 1.89 cm. Pulmonic Valve: The  pulmonic valve was not well visualized. Venous: The inferior vena cava is dilated in size with less than 50% respiratory variability, suggesting right atrial pressure of 15 mmHg. Additional Comments: Spectral Doppler performed. Color Doppler performed.  LEFT VENTRICLE PLAX 2D LVIDd:         5.20 cm LVIDs:         3.20 cm LV PW:         1.20 cm LV IVS:        1.40 cm LVOT diam:     2.50 cm LV SV:         103 LV SV Index:   45  LVOT Area:     4.91 cm  LV Volumes (MOD) LV vol d, MOD A2C: 112.0 ml LV vol d, MOD A4C: 103.0 ml LV vol s, MOD A2C: 28.3 ml LV vol s, MOD A4C: 38.5 ml LV SV MOD A2C:     83.7 ml LV SV MOD A4C:     103.0 ml LV SV MOD BP:      76.7 ml IVC IVC diam: 2.30 cm AORTIC VALVE AV Area (Vmax):    2.49 cm AV Area (Vmean):   2.43 cm AV Area (VTI):     1.89 cm AV Vmax:           235.00 cm/s AV Vmean:          157.000 cm/s AV VTI:            0.544 m AV Peak Grad:      22.1 mmHg AV Mean Grad:      12.0 mmHg LVOT Vmax:         119.00 cm/s LVOT Vmean:        77.700 cm/s LVOT VTI:          0.209 m LVOT/AV VTI ratio: 0.38  AORTA Ao Asc diam: 3.60 cm MITRAL VALVE MV Area VTI:  2.97 cm    SHUNTS MV Peak grad: 13.0 mmHg   Systemic VTI:  0.21 m MV Mean grad: 7.0 mmHg    Systemic Diam: 2.50 cm MV Vmax:      1.80 m/s MV Vmean:     124.5 cm/s Dalton McleanMD Electronically signed by Wilfred Lacy Signature Date/Time: 11/29/2023/10:46:54 AM    Final    DG Chest Portable 1 View Result Date: 11/28/2023 CLINICAL DATA:  Shortness of breath. EXAM: PORTABLE CHEST 1 VIEW COMPARISON:  November 05, 2023. FINDINGS: The heart size and mediastinal contours are within normal limits. Both lungs are clear. The visualized skeletal structures are unremarkable. IMPRESSION: No active disease. Electronically Signed   By: Lupita Raider M.D.   On: 11/28/2023 09:19     Discharge Instructions: Discharge Instructions     AMB referral to pulmonary rehabilitation   Complete by: As directed    Please select a program: Pulmonary Rehabilitation   Diagnosis: (See process instructions below for COPD requirements): COPD-Gold 2: Moderate 50% </= FEV1 <80% predicted   After initial evaluation and assessments completed: Virtual Based Care may be provided alone or in conjunction with Pulmonary Rehab/Respiratory Care services based on patient barriers.: Yes   Call MD for:  difficulty breathing, headache or visual disturbances   Complete by: As directed     Call MD for:  extreme fatigue   Complete by: As directed    Call MD for:  hives   Complete by: As directed    Call MD for:  persistant dizziness or light-headedness   Complete by: As  directed    Call MD for:  persistant nausea and vomiting   Complete by: As directed    Call MD for:  redness, tenderness, or signs of infection (pain, swelling, redness, odor or green/yellow discharge around incision site)   Complete by: As directed    Call MD for:  severe uncontrolled pain   Complete by: As directed    Call MD for:  temperature >100.4   Complete by: As directed    Diet - low sodium heart healthy   Complete by: As directed    Discharge instructions   Complete by: As directed    You came to the hospital for difficulty breathing and you were diagnosed with a COPD exacerbation.  We treated you with steroids and antibiotics.    *We have started you on the following medications  -Augmentin, take one tablet twice a day (first dose is this evening)  -Protonix 40 mg, take this once a day  -Tussionex, take 5 mL every 12 hours for cough suppressant  -Lidocaine patches, place one patch on chest every day  -Phenol also know as chloraseptic, one spray in the mouth or throat as needed for throat irritation / pain   *It is very important that you schedule an appointment with the Lung doctor!  *Please schedule a follow up with Dr. Suszanne Conners   Follow-up appointments: Please visit your family doctor in 7 to 10 days  If you have any questions or concerns please feel free to call: Internal medicine clinic at [phone]   If you have any of these following symptoms, please call us or seek care at an emergency department: -Chest Pain -Difficulty Breathing -Syncope (passing out) -Drooping of face -Slurred speech -Sudden weakness in your leg or arm -Fever -Chills   We are glad that you are feeling better, it was a pleasure to care for you!  Faith Rogue DO   Increase activity slowly   Complete  by: As directed        Signed: Faith Rogue DO [facility] - PGY1 Pager: [phone] 12/04/2023, 8:48 AM    Please contact the on call pager after 5 pm and on weekends at [phone].

## 2023-12-05 ENCOUNTER — Telehealth: Payer: Self-pay

## 2023-12-05 NOTE — Transitions of Care (Post Inpatient/ED Visit) (Signed)
   12/05/2023  Name: Rivan Siordia MRN: [id number] DOB: [birth year]  Today's TOC FU Call Status: Today's TOC FU Call Status:: Successful TOC FU Call Completed TOC FU Call Complete Date: 12/05/23 Patient's Name and Date of Birth confirmed.  Patient answered and confirmed name/dob and declined participation in [facility] Program   TOC Interventions Today    Flowsheet Row Most Recent Value  TOC Interventions   TOC Interventions Discussed/Reviewed --  [Patient answered, verified name/dob and declined participation and hung up the phone]       Interventions Today    Flowsheet Row Most Recent Value  Chronic Disease   Chronic disease during today's visit Chronic Obstructive Pulmonary Disease (COPD)  [Patient answered, verified name/dob and declined participation and hung up the phone]  General Interventions   General Interventions Discussed/Reviewed --  [Patient answered, verified name/dob and declined participation and hung up the phone]          Hilbert Odor RN, CCM [place]  VBCI-Population Health RN Care Manager [phone]

## 2023-12-05 NOTE — Transitions of Care (Post Inpatient/ED Visit) (Signed)
   12/05/2023  Name: Richard [name] MRN: [id number] DOB: July 11, 1961  Today's TOC FU Call Status: Today's TOC FU Call Status:: Unsuccessful Call (1st Attempt) Unsuccessful Call (1st Attempt) Date: 12/05/23  Attempted to reach the patient regarding the most recent Inpatient/ED visit.  Follow Up Plan: Additional outreach attempts will be made to reach the patient to complete the Transitions of Care (Post Inpatient/ED visit) call.   Hilbert Odor RN, CCM [place]  VBCI-Population Health RN Care Manager [phone]

## 2023-12-10 ENCOUNTER — Encounter: Payer: 59 | Admitting: Internal Medicine

## 2023-12-10 ENCOUNTER — Ambulatory Visit (INDEPENDENT_AMBULATORY_CARE_PROVIDER_SITE_OTHER): Payer: 59 | Admitting: Podiatry

## 2023-12-10 DIAGNOSIS — Z89432 Acquired absence of left foot: Secondary | ICD-10-CM

## 2023-12-10 DIAGNOSIS — L84 Corns and callosities: Secondary | ICD-10-CM

## 2023-12-10 DIAGNOSIS — E1140 Type 2 diabetes mellitus with diabetic neuropathy, unspecified: Secondary | ICD-10-CM

## 2023-12-10 DIAGNOSIS — Z9889 Other specified postprocedural states: Secondary | ICD-10-CM

## 2023-12-10 DIAGNOSIS — M2141 Flat foot [pes planus] (acquired), right foot: Secondary | ICD-10-CM

## 2023-12-10 DIAGNOSIS — M2142 Flat foot [pes planus] (acquired), left foot: Secondary | ICD-10-CM

## 2023-12-10 NOTE — Progress Notes (Signed)
  Subjective:  Patient ID: Richard [name], male    DOB: March 28, 1961,  MRN: [id number]   DOS: 09/18/23 Procedure: Left foot Trans metatarsal amputation, tendo achilles lengthening  63 y.o. male seen for post op check.    Following up for wound in the transmetatarsal amputation site.  Review of Systems: Negative except as noted in the HPI. Denies N/V/F/Ch.   Objective:  There were no vitals filed for this visit. There is no height or weight on file to calculate BMI. Constitutional Well developed. Well nourished.  Vascular Foot warm and well perfused. Capillary refill normal to all digits.   No calf pain with palpation  Neurologic Normal speech. Oriented to person, place, and time. Epicritic sensation Absent to forefoot.  Dermatologic At area of prior dehiscence centrally anteriorly on the amputation site there is full healing noted with no open wound present there is skin fissure but no open wound underlying.     Orthopedic: S/p Transmet amp to the left foot.   Radiographs: 10/22/2023 XR 3 views AP lateral bleak of the left foot weightbearing.  Findings: No evidence of osteolysis or erosions noted on today's x-rays of the distal metatarsal stump site no evidence of subcutaneous emphysema  Pathology: Acute and chronic osteomyelitis  Micro: E Cloacae.   Assessment:  S/p L foot TMA, TAL Plan:  Patient was evaluated and treated and all questions answered.   3 mo stats post L foot TMA, TAL -Now fully healed status post left foot TMA TAL no open wounds present. -Weightbearing as tolerated in regular shoes -Will have him casted for diabetic shoes and liner with toe filler today with our pedorthist Tricia appreciate her assistance. -Patient will follow-up in 3 months for routine diabetic footcare for the right foot with Dr. Stacie Acres -Continue to monitor for any wounds on the left foot        [name], DPM [facility] / [facility]                   12/10/2023

## 2023-12-11 ENCOUNTER — Ambulatory Visit (HOSPITAL_BASED_OUTPATIENT_CLINIC_OR_DEPARTMENT_OTHER): Payer: 59 | Admitting: General Surgery

## 2023-12-11 ENCOUNTER — Other Ambulatory Visit: Payer: Self-pay

## 2023-12-11 NOTE — Telephone Encounter (Signed)
 Pt was called - stated he had called to see if his doctor had received fax for the inserts for his diabetic shoes. I talked to [facility] who stated we have not received the fax. Stated he will give their office a call.

## 2023-12-12 ENCOUNTER — Telehealth: Payer: Self-pay

## 2023-12-12 ENCOUNTER — Telehealth: Payer: Self-pay | Admitting: *Deleted

## 2023-12-12 NOTE — Progress Notes (Signed)
 Patient presents to the office today for diabetic shoe and insole measuring.  Patient was measured with brannock device to determine size and width for 1 pair of extra depth shoes and foam casted for 3 pair of insoles.   Documentation of medical necessity will be sent to patient's treating diabetic doctor to verify and sign.   Patient's diabetic provider: Carlynn Purl Do   Shoes and insoles will be ordered at that time and patient will be notified for an appointment for fitting when they arrive.   Shoe size (per patient): 14 Shoe choice:   X2250M / X2220M  Shoe size ordered: 88M  Ppw / abn signed

## 2023-12-12 NOTE — Telephone Encounter (Signed)
 Called and spoke with patient in regards his diabetic shoes. Told the patient I never received the paperwork from [facility]. I contacted [facility] along with Mr. Colgate on the phone, letting the office know that [facility] office has not received any paperwork for diabetic shoes. LVM for Trish who Mr. Geister did evaluation with for his diabetic shoes to faxed [place] the paperwork. Advised pt to call [facility] and follow-up with Trish about the paperwork. I will contact pt if I received the paperwork.

## 2023-12-12 NOTE — Telephone Encounter (Signed)
 Tried to call patient and inform him that [facility] has not sent ppw to Dr. Yet because we have yet to scan his impressions as we just received the new scanner today and also have many other scans ahead of his I will put paper order into [facility] to get ball rolling .He was an add on to my schedule the other day  Patient has no VM set up   Richard [name] CPed, CFo, CFm

## 2023-12-12 NOTE — Telephone Encounter (Signed)
 Copied from CRM [id number]. Topic: Clinical - Prescription Issue >> Dec 12, 2023 10:01 AM Hector Shade B wrote: Reason for CRM: Patient called in regard to checking the status of paperwork the provider needed to sign off on in order to get his shoes and inserts via his podiatrist. Please call patient at [phone]

## 2023-12-12 NOTE — Telephone Encounter (Signed)
 I talked to [facility], front office, she  has not received the fax. She will call pt.

## 2023-12-13 ENCOUNTER — Other Ambulatory Visit: Payer: Self-pay | Admitting: Pharmacy Technician

## 2023-12-13 ENCOUNTER — Other Ambulatory Visit: Payer: Self-pay

## 2023-12-13 NOTE — Progress Notes (Signed)
 Specialty Pharmacy Refill Coordination Note  Richard [name] is a 63 y.o. male contacted today regarding refills of specialty medication(s) Mepolizumab Virginia Crews)   Patient requested Delivery   Delivery date: 12/17/23   Verified address: [address] [place]   Medication will be filled on 12/16/23.

## 2023-12-16 ENCOUNTER — Other Ambulatory Visit: Payer: Self-pay

## 2023-12-16 ENCOUNTER — Ambulatory Visit: Payer: 59 | Admitting: Student

## 2023-12-16 VITALS — BP 122/58 | HR 85 | Temp 98.2°F | Ht 75.0 in | Wt 226.2 lb

## 2023-12-16 DIAGNOSIS — Z794 Long term (current) use of insulin: Secondary | ICD-10-CM

## 2023-12-16 DIAGNOSIS — J4489 Other specified chronic obstructive pulmonary disease: Secondary | ICD-10-CM

## 2023-12-16 DIAGNOSIS — Z7984 Long term (current) use of oral hypoglycemic drugs: Secondary | ICD-10-CM

## 2023-12-16 DIAGNOSIS — E1151 Type 2 diabetes mellitus with diabetic peripheral angiopathy without gangrene: Secondary | ICD-10-CM | POA: Diagnosis not present

## 2023-12-16 MED ORDER — PREDNISONE 20 MG PO TABS: 40.0000 mg | ORAL_TABLET | ORAL | 0 refills | Status: DC | PRN

## 2023-12-16 MED ORDER — ALBUTEROL SULFATE HFA 108 (90 BASE) MCG/ACT IN AERS: 1.0000 | INHALATION_SPRAY | RESPIRATORY_TRACT | 0 refills | Status: DC | PRN

## 2023-12-16 MED ORDER — PREDNISONE 20 MG PO TABS: 20.0000 mg | ORAL_TABLET | Freq: Every day | ORAL | 0 refills | Status: DC

## 2023-12-16 NOTE — Patient Instructions (Addendum)
 Thank you, RichardJammy [name] for allowing [place] to provide your care today.   Referrals ordered today:   Referral Orders         AMB referral to pulmonary rehabilitation       I have ordered the following medication/changed the following medications:    Start the following medications: Meds ordered this encounter  Medications   albuterol (VENTOLIN HFA) 108 (90 Base) MCG/ACT inhaler    Sig: Inhale 1-2 puffs into the lungs every 4 (four) hours as needed for wheezing or shortness of breath.    Dispense:  6.7 g    Refill:  0   predniSONE (DELTASONE) 20 MG tablet    Sig: Take 1 tablet (20 mg total) by mouth daily with breakfast.    Dispense:  10 tablet    Refill:  0      Follow up:  1 month  for diabetes & A1c    Please Call:  [facility] at [facility] Address: [address], [place], [place] [address] Phone: [phone]   We look forward to seeing you next time. Please call our clinic at [phone] if you have any questions or concerns. The best time to call is Monday-Friday from 9am-4pm, but there is someone available 24/7. If after hours or the weekend, call the main hospital number and ask for the Internal Medicine Resident On-Call. If you need medication refills, please notify your pharmacy one week in advance and they will send [place] a request.   Thank you for trusting me with your care. Wishing you the best!  Lovie Macadamia MD [facility]

## 2023-12-16 NOTE — Assessment & Plan Note (Signed)
 Blood sugars elevated after prednisone, though patient states they have been improving. He is taking Synjardy 12.02-999 twice daily, Toujeo 4.5 mg weekly, Tresiba 20 units daily.  Will continue with current regimen the time being, reevaluate in 1 month for dedicated diabetes follow-up.  He may benefit from increase of his [place] dosage at that time.

## 2023-12-16 NOTE — Assessment & Plan Note (Addendum)
 Patient states they are feeling well, no shortness of breath, or chest pain.  Some persistent wheezing subjectively, and on exam today.  He says this is near his baseline.  Patient states he has a lot of symptoms around the spring and summer due to allergens, and during times of weather change.  He is compliant with his medications but still has persistent flares.  He has an appointment to follow-up with ENT soon, and I provided him with the information to call pulmonology.  He may benefit from allergy/immunology workup and advanced therapies in the future.  Denies any ongoing chest pain, shortness of breath, lower extremity edema, significant wheezing, throat pain, or respiratory distress.  Plan: Patient scheduled to see allergy immunology soon Completed course of antibiotics Completed prednisone Phone number provided for his pulmonologist Pulmonology rehab referral placed Prophylactic 5-day course of prednisone 40 given to patient to be taken in case of COPD/asthma exacerbation, wheezing, shortness of breath. Patient will call us if he requires this medication.

## 2023-12-16 NOTE — Progress Notes (Signed)
 Pharmacy Patient Advocate Encounter   Received notification from Patient Pharmacy that prior authorization for Nucala is required/requested.   Insurance verification completed.   The patient is insured through [employer] .   Per test claim: PA required; PA submitted to above mentioned insurance via CoverMyMeds Key/confirmation #/EOC [id number] Status is pending

## 2023-12-16 NOTE — Progress Notes (Unsigned)
 Subjective:  CC: Hospital follow-up, reactive airway disease  HPI:  [name] is a 63 y.o. person with a past medical history stated below and presents today for the stated chief complaint. Please see problem based assessment and plan for additional details.  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 1-2 times daily     albuterol (PROVENTIL) (2.5 MG/3ML) 0.083% nebulizer solution Take 3 mLs (2.5 mg total) by nebulization 2 (two) times daily. 90 mL 5   aspirin EC 81 MG EC tablet Take 1 tablet (81 mg total) by mouth daily. 30 tablet 3   atorvastatin (LIPITOR) 40 MG tablet Take 1 tablet (40 mg total) by mouth daily. 90 tablet 3   benzonatate (TESSALON) 200 MG capsule Take 1 capsule (200 mg total) by mouth 3 (three) times daily as needed for cough. 30 capsule 1   Budeson-Glycopyrrol-Formoterol (BREZTRI AEROSPHERE) 160-9-4.8 MCG/ACT AERO Inhale 2 puffs into the lungs in the morning  and at bedtime. 10.7 g 11   clopidogrel (PLAVIX) 75 MG tablet Take 1 tablet (75 mg total) by mouth daily. 90 tablet 3   Continuous Glucose Sensor (FREESTYLE LIBRE 3 SENSOR) MISC Place 1 sensor on the skin every 14 days. Use to check glucose continuously 6 each 3   Dulaglutide (TRULICITY) 4.5 MG/0.5ML SOAJ Inject 4.5 mg as directed once a week. 2 mL 5   Empagliflozin-metFORMIN HCl (SYNJARDY) 12.02-999 MG TABS Take 1 tablet by mouth in the morning and at bedtime. 60 tablet 5   escitalopram (LEXAPRO) 5 MG tablet Take 1 tablet (5 mg total) by mouth daily. 30 tablet 2   ferrous sulfate 325 (65 FE) MG tablet Take 1 tablet (325 mg total) by mouth daily. 30 tablet 3   fluticasone (FLONASE) 50 MCG/ACT nasal spray Place 1 spray into both nostrils daily. (Patient taking differently: Place 1 spray into both nostrils as needed for allergies.) 16 g 11   glucose blood test strip 1 each by Other route in the morning and at bedtime.     insulin degludec (TRESIBA) 100 UNIT/ML FlexTouch Pen Inject 20 Units into the skin daily. 15 mL 4   Insulin Pen Needle (PEN NEEDLES) 32G X 4 MM MISC 1 each by Does not apply route daily. 100 each 4   ipratropium-albuterol (DUONEB)  0.5-2.5 (3) MG/3ML SOLN Use 1 vial (3 mLs) by nebulization every 6 (six) hours as needed. 360 mL 0   lidocaine (LIDODERM) 5 % Place 1 patch onto the skin daily. Remove & Discard patch within 12 hours or as directed by MD 30 patch 0   meloxicam (MOBIC) 15 MG tablet Take 1 tablet (15 mg total) by mouth daily as needed for pain. 30 tablet 2   menthol-cetylpyridinium (CEPACOL) 3 MG lozenge Take 1 lozenge (3 mg total) by mouth as needed for sore throat. 100 tablet 12   Mepolizumab (NUCALA) 100 MG/ML SOAJ Inject 1 mL (100 mg total) into the skin every 28 (twenty-eight) days. 1 mL 5   montelukast (SINGULAIR) 10 MG tablet Take 1 tablet (10 mg total) by mouth at bedtime. 90 tablet 3   mupirocin ointment (BACTROBAN) 2 % Place 1 Application into the nose 2 (two) times  daily. 22 g 0   pantoprazole (PROTONIX) 40 MG tablet Take 1 tablet (40 mg total) by mouth daily. 30 tablet 0   phenol (CHLORASEPTIC) 1.4 % LIQD Use as directed 1 spray in the mouth or throat as needed for throat irritation / pain. 20 mL 0   polyethylene glycol powder (GLYCOLAX/MIRALAX) 17 GM/SCOOP powder Take 17 g by mouth daily. (Patient taking differently: Take 17 g by mouth as needed for moderate constipation.) 238 g 0   pregabalin (LYRICA) 100 MG capsule Take 1 capsule (100 mg total) by mouth 3 (three) times daily. 90 capsule 2   triamcinolone ointment (KENALOG) 0.5 % Apply 1 Application topically 2 (two) times daily. 60 g 3   No current facility-administered medications on file prior to visit.    Review of Systems: Please see assessment and plan for pertinent positives and negatives.  Objective:   Vitals:   12/16/23 0903  BP: (!) 122/58  Pulse: 85  Temp: 98.2 F (36.8 C)  TempSrc: Oral  SpO2: 100%  Weight: 226 lb 3.2 oz (102.6 kg)  Height: 6\' 3"  (1.905 m)    Physical Exam: Constitutional: Well-appearing ENT: No pharyngeal erythema, exudate Cardiovascular: Regular rate and rhythm Pulmonary/Chest: Mild end expiratory wheezing bilaterally Abdominal: soft, non-tender, non-distended Extremities: No edema of the lower extremities bilaterally Psych: Very pleasant affect Thought process is linear and is goal-directed.     Assessment & Plan:  Asthma-COPD overlap syndrome Patient states they are feeling well, no shortness of breath, or chest pain.  Some persistent wheezing subjectively, and on exam today.  He says this is near his baseline.  Patient states he has a lot of symptoms around the spring and summer due to allergens, and during times of weather change.  He is compliant with his medications but still has persistent flares.  He has an appointment to follow-up with ENT soon, and I provided him with the information to call pulmonology.  He may benefit from allergy/immunology  workup and advanced therapies in the future.  Denies any ongoing chest pain, shortness of breath, lower extremity edema, significant wheezing, throat pain, or respiratory distress.  Plan: Patient scheduled to see allergy immunology soon Completed course of antibiotics Completed prednisone Phone number provided for his pulmonologist Pulmonology rehab referral placed Prophylactic 5-day course of prednisone 40 given to patient to be taken in case of COPD/asthma exacerbation, wheezing, shortness of breath. Patient will call us if he requires this medication.   Type 2 diabetes mellitus with peripheral artery disease (HCC) Blood sugars elevated after prednisone, though patient states they have been improving. He is taking  Synjardy 12.02-999 twice daily, Toujeo 4.5 mg weekly, Tresiba 20 units daily.  Will continue with current regimen the time being, reevaluate in 1 month for dedicated diabetes follow-up.  He may benefit from increase of his [place] dosage at that time.   Patient discussed with Dr. Lovie Macadamia MD [facility]  PGY-1 Pager: [phone]  Phone: [phone] Date 12/16/2023  Time 11:33 AM

## 2023-12-17 ENCOUNTER — Other Ambulatory Visit: Payer: Self-pay

## 2023-12-17 NOTE — Progress Notes (Signed)
 Pharmacy Patient Advocate Encounter  Received notification from [facility] that Prior Authorization for Richard [name] has been DENIED.   Denial letter will be faxed to PCP. Patient was previously managed by Pulm, but has not followed up with them and has no showed last three appointments. ATC patient to get PCP information. Left message.   PA #/Case ID/Reference #:  [id number]

## 2023-12-17 NOTE — Progress Notes (Signed)
 Internal Medicine Clinic Attending  Case discussed with the resident at the time of the visit.  We reviewed the resident's history and exam and pertinent patient test results.  I agree with the assessment, diagnosis, and plan of care documented in the resident's note.

## 2023-12-18 ENCOUNTER — Other Ambulatory Visit: Payer: Self-pay

## 2023-12-20 ENCOUNTER — Other Ambulatory Visit: Payer: Self-pay | Admitting: Internal Medicine

## 2023-12-20 NOTE — Telephone Encounter (Signed)
 Last Fill: 06/20/23  Last OV: 12/16/23 Next OV: 12*/31/25 AWV  Routing to provider for review/authorization.

## 2023-12-20 NOTE — Telephone Encounter (Signed)
 Copied from CRM [id number]. Topic: Clinical - Medication Refill >> Dec 20, 2023  1:54 PM Prudencio Pair wrote: Most Recent Primary Care Visit:  Provider: Lovie Macadamia  Department: [facility]  Visit Type: OPEN ESTABLISHED  Date: 12/16/2023  Medication: Insulin Pen Needle (PEN NEEDLES) 32G X 4 MM MISC  Has the patient contacted their pharmacy? No (Agent: If no, request that the patient contact the pharmacy for the refill. If patient does not wish to contact the pharmacy document the reason why and proceed with request.) (Agent: If yes, when and what did the pharmacy advise?)  Is this the correct pharmacy for this prescription? Yes If no, delete pharmacy and type the correct one.  This is the patient's preferred pharmacy:  [facility] [address], [place] - [address] AT [facility] [address] [place] [address] Phone: [phone] Fax: [phone]   Has the prescription been filled recently? No  Is the patient out of the medication? Yes  Has the patient been seen for an appointment in the last year OR does the patient have an upcoming appointment? Yes  Can we respond through MyChart? Yes  Agent: Please be advised that Rx refills may take up to 3 business days. We ask that you follow-up with your pharmacy.

## 2023-12-23 MED ORDER — PEN NEEDLES 32G X 4 MM MISC: 1.0000 | Freq: Every day | 3 refills | Status: DC

## 2023-12-24 ENCOUNTER — Telehealth: Payer: Self-pay

## 2023-12-24 NOTE — Telephone Encounter (Signed)
 Copied from CRM [id number]. Topic: Medical Record Request - Other >> Dec 11, 2023 11:14 AM Yvone Neu wrote: Reason for CRM: Patient was calling to check to see if his doctor received a fax about his foot, patients callback number is [phone]. >> Dec 24, 2023  9:20 AM Irine Seal wrote: Patient Inquiry - Status of Paperwork for Shoes & Inserts  Patient called to check on the status of paperwork that the provider needs to sign for shoe and insert approval through his podiatrist. He stated that he has not received any updates.  Patient requests a follow-up call at: [phone]  Next Steps:

## 2023-12-24 NOTE — Telephone Encounter (Signed)
 Called and spoke with pt, letting him know that I still have not received forms from [facility]. Advised pt to contact the office and have the office faxed the forms over.

## 2023-12-24 NOTE — Telephone Encounter (Signed)
 Pt called back missing the call from our office.  I checked chart and gave him information that the fax number we had in epic was not correct and we have now faxed it to the correct fax number and once we get it back they will start making his inserts.  He said thank you so much for the information.

## 2023-12-24 NOTE — Telephone Encounter (Signed)
 Paper work for shoes now sent to correct fax: fax # in Epic is wrong. I tried to cal patient and give update but VM has not been set up.   Addison Bailey CPed, CFo, CFm [facility]

## 2023-12-26 ENCOUNTER — Telehealth (HOSPITAL_COMMUNITY): Payer: Self-pay

## 2023-12-26 ENCOUNTER — Telehealth (HOSPITAL_COMMUNITY): Payer: Self-pay | Admitting: *Deleted

## 2023-12-26 NOTE — Telephone Encounter (Signed)
 Recv'ed office referral for PR, pt stated he is interested. Will pass to RN for review.

## 2023-12-26 NOTE — Telephone Encounter (Signed)
 Pt insurance is active and benefits verified through [facility] [employer]. Co-pay $0, DED $257/$257 met, out of pocket $9,350/$858.27 met, co-insurance 20%. No pre-authorization required. 12/26/23 @ 11:12am, REF# [id number].

## 2023-12-26 NOTE — Telephone Encounter (Signed)
 Called and LVM to check in if he has received his DM shoes. Await return call. Ethelda Chick BS, ACSM-CEP 12/26/2023 11:54 AM

## 2023-12-31 ENCOUNTER — Telehealth (INDEPENDENT_AMBULATORY_CARE_PROVIDER_SITE_OTHER): Payer: Self-pay | Admitting: Otolaryngology

## 2023-12-31 NOTE — Telephone Encounter (Signed)
 LVM confirm appt & location [id number] afm

## 2024-01-01 ENCOUNTER — Ambulatory Visit (INDEPENDENT_AMBULATORY_CARE_PROVIDER_SITE_OTHER): Payer: 59

## 2024-01-01 ENCOUNTER — Ambulatory Visit: Payer: Self-pay | Admitting: Internal Medicine

## 2024-01-01 ENCOUNTER — Encounter (INDEPENDENT_AMBULATORY_CARE_PROVIDER_SITE_OTHER): Payer: Self-pay

## 2024-01-01 ENCOUNTER — Other Ambulatory Visit: Payer: Self-pay | Admitting: Internal Medicine

## 2024-01-01 VITALS — BP 127/71 | HR 102 | Ht 75.0 in | Wt 236.0 lb

## 2024-01-01 DIAGNOSIS — R0982 Postnasal drip: Secondary | ICD-10-CM | POA: Diagnosis not present

## 2024-01-01 DIAGNOSIS — R0981 Nasal congestion: Secondary | ICD-10-CM | POA: Diagnosis not present

## 2024-01-01 DIAGNOSIS — J343 Hypertrophy of nasal turbinates: Secondary | ICD-10-CM

## 2024-01-01 DIAGNOSIS — E1151 Type 2 diabetes mellitus with diabetic peripheral angiopathy without gangrene: Secondary | ICD-10-CM

## 2024-01-01 DIAGNOSIS — D3705 Neoplasm of uncertain behavior of pharynx: Secondary | ICD-10-CM

## 2024-01-01 DIAGNOSIS — J310 Chronic rhinitis: Secondary | ICD-10-CM

## 2024-01-01 MED ORDER — FREESTYLE LIBRE 3 SENSOR MISC
3 refills | Status: DC
Start: 2024-01-01 — End: 2024-03-05

## 2024-01-01 NOTE — Telephone Encounter (Signed)
  Chief Complaint: SOB Symptoms: wheezing, nasal congestion, cough Frequency: 3 days Pertinent Negatives: Patient denies fever, chest soreness Disposition: [x] ED /[] Urgent Care (no appt availability in office) / [] Appointment(In office/virtual)/ []  [facility]/ [] Home Care/ [] Refused Recommended Disposition /[] [facility]/ []  Follow-up with PCP Additional Notes: to D. Copied from CRM [id number]. Topic: Appointments - Red Word Triage >> Jan 01, 2024  8:51 AM [name] F wrote: Red Word Triage: Trouble Breathing; Asthma with allergic reaction Reason for Disposition  [1] MODERATE difficulty breathing (e.g., speaks in phrases, SOB even at rest, pulse 100-120) AND [2] NEW-onset or WORSE than normal  Answer Assessment - Initial Assessment Questions 1. RESPIRATORY STATUS: "Describe your breathing?" (e.g., wheezing, shortness of breath, unable to speak, severe coughing)      SOB, wheezing, cough  2. ONSET: "When did this breathing problem begin?"      3 days  3. PATTERN "Does the difficult breathing come and go, or has it been constant since it started?"      constant 4. SEVERITY: "How bad is your breathing?" (e.g., mild, moderate, severe)    - MILD: No SOB at rest, mild SOB with walking, speaks normally in sentences, can lie down, no retractions, pulse < 100.    - MODERATE: SOB at rest, SOB with minimal exertion and prefers to sit, cannot lie down flat, speaks in phrases, mild retractions, audible wheezing, pulse 100-120.    - SEVERE: Very SOB at rest, speaks in single words, struggling to breathe, sitting hunched forward, retractions, pulse > 120      moderate 5. RECURRENT SYMPTOM: "Have you had difficulty breathing before?" If Yes, ask: "When was the last time?" and "What happened that time?"      yes 7. LUNG HISTORY: "Do you have any history of lung disease?"  (e.g., pulmonary embolus, asthma, emphysema)     asthma 8. CAUSE: "What do you think is causing the breathing  problem?"      Pollen  9. OTHER SYMPTOMS: "Do you have any other symptoms? (e.g., dizziness, runny nose, cough, chest pain, fever)     Cough, hoarse, headache, runny nose 10. O2 SATURATION MONITOR:  "Do you use an oxygen saturation monitor (pulse oximeter) at home?" If Yes, ask: "What is your reading (oxygen level) today?" "What is your usual oxygen saturation reading?" (e.g., 95%)       N/a  Protocols used: Breathing Difficulty-A-AH

## 2024-01-01 NOTE — Telephone Encounter (Signed)
 Copied from CRM [id number]. Topic: Clinical - Medication Refill >> Jan 01, 2024  8:47 AM [name] F wrote: Most Recent Primary Care Visit:  Provider: Lovie Macadamia  Department: [facility]  Visit Type: OPEN ESTABLISHED  Date: 12/16/2023  Medication: Continuous Glucose Sensor (FREESTYLE LIBRE 3 SENSOR) MISC [[id number]]  Has the patient contacted their pharmacy? No  Is this the correct pharmacy for this prescription? Yes If no, delete pharmacy and type the correct one.  This is the patient's preferred pharmacy:  [facility] [address] [place] - [address] AT [facility] [address] [place] [address] Phone: [phone] Fax: [phone]     Has the prescription been filled recently? No  Is the patient out of the medication? Yes  Has the patient been seen for an appointment in the last year OR does the patient have an upcoming appointment? Yes  Can we respond through MyChart? Yes  Agent: Please be advised that Rx refills may take up to 3 business days. We ask that you follow-up with your pharmacy.

## 2024-01-02 DIAGNOSIS — R0982 Postnasal drip: Secondary | ICD-10-CM | POA: Insufficient documentation

## 2024-01-02 DIAGNOSIS — J310 Chronic rhinitis: Secondary | ICD-10-CM | POA: Insufficient documentation

## 2024-01-02 DIAGNOSIS — J343 Hypertrophy of nasal turbinates: Secondary | ICD-10-CM | POA: Insufficient documentation

## 2024-01-02 NOTE — Progress Notes (Signed)
 Patient ID: Richard [name], male   DOB: [birth year], 63 y.o.   MRN: [id number]  Follow-up: Vallecular thickening, shortness of breath New: Chronic nasal congestion, postnasal drainage  HPI: The patient is a 63 year old male who returns today for his follow-up evaluation.  The patient was previously seen on 12/02/2023 as an inpatient consult.  At that time, he was admitted to the [facility] to treat his shortness of breath and asthma exacerbation.  His CT scan showed asymmetric thickening within the right vallecula.  Reevaluation did not show any abnormal lesion.  The patient returns today reporting improvement in his shortness of breath.  However, he has new complaints of chronic nasal congestion and postnasal drainage.  He has been symptomatic for the past month.  He denies any dysphagia, odynophagia, facial pain, or fever.  Exam: General: Communicates without difficulty, well nourished, no acute distress. Head: Normocephalic, no evidence injury, no tenderness, facial buttresses intact without stepoff. Face/sinus: No tenderness to palpation and percussion. Facial movement is Richard and symmetric. Eyes: PERRL, EOMI. No scleral icterus, conjunctivae clear. Neuro: CN II exam reveals vision grossly intact.  No nystagmus at any point of gaze. Ears: Auricles well formed without lesions.  Ear canals are intact without mass or lesion.  No erythema or edema is appreciated.  The TMs are intact without fluid. Nose: External evaluation reveals Richard support and skin without lesions.  Dorsum is intact.  Anterior rhinoscopy reveals congested mucosa over anterior aspect of inferior turbinates and intact septum.  No purulence noted. Oral:  Oral cavity and oropharynx are intact, symmetric, without erythema or edema.  Mucosa is moist without lesions.  No palpable tongue base lesion.  Neck: Full range of motion without pain.  There is no significant lymphadenopathy.  No masses palpable.  Thyroid bed within Richard  limits to palpation.  Parotid glands and submandibular glands equal bilaterally without mass.  Trachea is midline. Neuro:  CN 2-12 grossly intact.   Procedure:  Flexible Fiberoptic Laryngoscopy Anesthesia: None Description: Risks, benefits, and alternatives of flexible endoscopy were explained to the patient. Specific mention was made of the risk of throat numbness with difficulty swallowing, possible bleeding from the nose and mouth, and pain from the procedure.  The patient gave oral consent to proceed.  The flexible scope was inserted into the right nasal cavity and advanced towards the nasopharynx.  Visualized mucosa over the turbinates and septum were congested.  The nasopharynx was clear.  Oropharyngeal walls were symmetric and mobile without lesion, mass, or edema.  Hypopharynx was also without  lesion or edema.  Larynx was mobile without lesions.  No lesions or asymmetry in the supraglottic larynx.  Arytenoid mucosa was mildly edematous.  Posterior commissure with mild edema and redundant mucosa.  True vocal folds were pale yellow and without mass or lesion.     Assessment: 1.  Chronic rhinitis with nasal mucosal congestion and bilateral inferior turbinate hypertrophy. 2.  Chronic postnasal drainage. 3.  Asymmetric thickening of the right vallecula on CT scan.  However, no suspicious lesion or mass is noted on today's laryngoscopy and physical examination.  Plan: 1.  The physical exam and laryngoscopy findings are reviewed with the patient. 2.  Flonase nasal spray 2 sprays each nostril daily. 3.  Nasal saline irrigation daily. 4.  The patient will return for reevaluation in 6 months, sooner if he becomes symptomatic.

## 2024-01-07 ENCOUNTER — Other Ambulatory Visit (HOSPITAL_COMMUNITY): Payer: Self-pay

## 2024-01-14 ENCOUNTER — Other Ambulatory Visit: Payer: Self-pay

## 2024-01-14 NOTE — Progress Notes (Signed)
 Disenrolling - patient PA denied for Nucala. Patient was originally a pulmonology patient but was released from office. Patient converted to call center patient but Elmarie Shiley has not been able to reach patient regarding PA status and can not move forward without patient participation. Will re-enroll for benefits investigation if patient reaches out to call center.

## 2024-01-15 ENCOUNTER — Telehealth (HOSPITAL_COMMUNITY): Payer: Self-pay

## 2024-01-15 NOTE — Telephone Encounter (Signed)
 Called pt to follow up on receiving his DM shoes. Also, to see if he is still interested in participating in the PR program. No answer. Unable to leave a message due VM is not set up.

## 2024-01-16 ENCOUNTER — Ambulatory Visit (INDEPENDENT_AMBULATORY_CARE_PROVIDER_SITE_OTHER): Admitting: Podiatry

## 2024-01-16 ENCOUNTER — Encounter: Payer: Self-pay | Admitting: Podiatry

## 2024-01-16 VITALS — Ht 75.0 in | Wt 236.0 lb

## 2024-01-16 DIAGNOSIS — Z89432 Acquired absence of left foot: Secondary | ICD-10-CM | POA: Diagnosis not present

## 2024-01-16 DIAGNOSIS — M79674 Pain in right toe(s): Secondary | ICD-10-CM

## 2024-01-16 DIAGNOSIS — M79675 Pain in left toe(s): Secondary | ICD-10-CM | POA: Diagnosis not present

## 2024-01-16 DIAGNOSIS — E1140 Type 2 diabetes mellitus with diabetic neuropathy, unspecified: Secondary | ICD-10-CM | POA: Diagnosis not present

## 2024-01-16 DIAGNOSIS — B351 Tinea unguium: Secondary | ICD-10-CM | POA: Diagnosis not present

## 2024-01-16 NOTE — Progress Notes (Signed)
 This patient presents to the office with chief complaint of long thick nails and diabetic feet.  This patient  says there  is  no pain and discomfort in his feet.  This patient says there are long thick painful nails.on his right foot.  These nails are painful walking and wearing shoes.  Patient is a diabetic with amputation left forefoot.  Patient is unable to  self treat his own nails . This patient presents  to the office today for treatment of the  long nails and a foot evaluation due to history of  diabetes.  General Appearance  Alert, conversant and in no acute stress.  Vascular  Dorsalis pedis and posterior tibial  pulses are palpable  right    Temperature is within normal limits  bilaterally.  Neurologic  Senn-Weinstein monofilament wire test within normal limits  bilaterally. Muscle power within normal limits bilaterally.  Nails Thick disfigured discolored nails with subungual debris  from hallux to fifth toes right foot.. No evidence of bacterial infection or drainage bilaterally.  Orthopedic  No limitations of motion of motion feet .  No crepitus or effusions noted.  No bony pathology or digital deformities noted. TMA left foot.  Skin  normotropic skin with noted bilaterally.  No signs of infections or ulcers noted.   Porokeratosis sub 2 right foot.  Onychomycosis  Diabetes with no foot complications  Debride nails right foot with nail nipper and dremel tool.   RTC 3 months.   Helane Gunther DPM

## 2024-01-20 ENCOUNTER — Ambulatory Visit (INDEPENDENT_AMBULATORY_CARE_PROVIDER_SITE_OTHER): Admitting: Student

## 2024-01-20 ENCOUNTER — Telehealth (HOSPITAL_COMMUNITY): Payer: Self-pay

## 2024-01-20 ENCOUNTER — Ambulatory Visit: Payer: Self-pay | Admitting: *Deleted

## 2024-01-20 ENCOUNTER — Other Ambulatory Visit: Payer: Self-pay

## 2024-01-20 VITALS — BP 121/58 | HR 96 | Temp 98.2°F | Ht 75.0 in | Wt 227.6 lb

## 2024-01-20 DIAGNOSIS — R21 Rash and other nonspecific skin eruption: Secondary | ICD-10-CM | POA: Diagnosis not present

## 2024-01-20 MED ORDER — DIPHENHYDRAMINE HCL 25 MG PO CAPS: 25.0000 mg | ORAL_CAPSULE | Freq: Every evening | ORAL | 0 refills | Status: AC | PRN

## 2024-01-20 MED ORDER — CETIRIZINE HCL 10 MG PO TABS: 10.0000 mg | ORAL_TABLET | Freq: Every day | ORAL | 2 refills | Status: AC

## 2024-01-20 MED ORDER — TRIAMCINOLONE ACETONIDE 0.1 % EX CREA: 1.0000 | TOPICAL_CREAM | Freq: Two times a day (BID) | CUTANEOUS | 3 refills | Status: DC

## 2024-01-20 NOTE — Assessment & Plan Note (Addendum)
 Per patient, this rash started about 2 weeks ago and has progressively worsened.  He has tried topical hydrocortisone cream is not tried any oral antihistamines such as Benadryl or Zyrtec.  He states that this rash begins every spring and worsened throughout the summer and will then resolve in the fall/winter time.  Patient reported that his rash usually progresses to this extent as well.  He describes it as extremely itchy.  He denies any potential for bedbugs. He does report using different laundry soap, but this was after the rash started. On exam, he has a diffuse maculopapular rash on trunk and all 4 extremities.  He does not have this rash present on the palms.  There is evidence of hyperpigmentation and scarring from prior rash in the past.  I do suspect a form of either atopic dermatitis that is severe giving his underlying allergies and lung disease versus Pityriasis rosea.  Low suspicion for secondary syphilis given the resolution of the rash with the season changes and the lack of lesions on his bilateral palms.  Plan: -Triamcinolone cream 0.1% BID -Dermatology referral - Patient instructed to take Zyrtec daily and to take Benadryl every night

## 2024-01-20 NOTE — Telephone Encounter (Signed)
 Called pt to see if he was interested in the pulmonary rehab program. Pt picked up the phone and hung up. Pt has not reached out.  Closed referral.

## 2024-01-20 NOTE — Telephone Encounter (Signed)
 Pt has an appt today @ 1345 PM.

## 2024-01-20 NOTE — Telephone Encounter (Signed)
  Chief Complaint: rash, itching Symptoms: red skin, itching sensation Frequency: constant X 1 week Pertinent Negatives: Patient denies fever, difficulty breathing/swallowing.  Disposition: [] ED /[] Urgent Care (no appt availability in office) / [x] Appointment(In office/virtual)/ []  [facility]/ [] Home Care/ [] Refused Recommended Disposition /[] [facility]/ []  Follow-up with PCP Additional Notes:  Patient returning missed call, requesting appointment. Widespread red flat rash with whole body itching X 1 week. Itching is intense, scratching to point of bleeding. He is unsure if this is from seasonal allergies or eczema, doesn't feel like his typical seasonal allergy symptoms.  Using allergy medication and flonase without effect. Denies all other symptoms. No acute visits available with pcp, scheduled acute visit today with alternate provider.       Copied From CRM [id number]. Reason for Triage: Patient states his allergies have been giving him some issues. He has been having issues with itching. Patient's call back # is [phone]    Reason for Disposition  SEVERE itching (i.e., interferes with sleep, normal activities or school)  Protocols used: Rash or Redness - [facility]

## 2024-01-20 NOTE — Assessment & Plan Note (Signed)
>>  ASSESSMENT AND PLAN FOR DIFFUSE MACULOPAPULAR RASH WRITTEN ON 01/20/2024  3:28 PM BY BENDER, EMILY, DO  Per patient, this rash started about 2 weeks ago and has progressively worsened.  He has tried topical hydrocortisone cream is not tried any oral antihistamines such as Benadryl  or Zyrtec .  He states that this rash begins every spring and worsened throughout the summer and will then resolve in the fall/winter time.  Patient reported that his rash usually progresses to this extent as well.  He describes it as extremely itchy.  He denies any potential for bedbugs. He does report using different laundry soap, but this was after the rash started. On exam, he has a diffuse maculopapular rash on trunk and all 4 extremities.  He does not have this rash present on the palms.  There is evidence of hyperpigmentation and scarring from prior rash in the past.  I do suspect a form of either atopic dermatitis that is severe giving his underlying allergies and lung disease versus Pityriasis rosea.  Low suspicion for secondary syphilis given the resolution of the rash with the season changes and the lack of lesions on his bilateral palms.  Plan: -Triamcinolone  cream 0.1% BID -Dermatology referral - Patient instructed to take Zyrtec  daily and to take Benadryl  every night

## 2024-01-20 NOTE — Addendum Note (Signed)
 Addended by: Aurora Lees on: 01/20/2024 03:29 PM   Modules accepted: Level of Service

## 2024-01-20 NOTE — Telephone Encounter (Signed)
 Attempted to contact patient- no answer- left message to call office  Summary: Allergies   Copied From CRM [id number]. Reason for Triage: Patient states his allergies have been giving him some issues. He has been having issues with itching. Patient's call back # is [phone]

## 2024-01-20 NOTE — Progress Notes (Signed)
 Acute Office Visit  Subjective:     Patient ID: Richard [name], male    DOB: Jul 07, 1961, 63 y.o.   MRN: [id number]  Chief Complaint  Patient presents with   Rash     Covering his body ( NECK , ARMS , LEGS ,STOMACH AND BACK )  for  roughly  a week or 2  he has been OTC  creams  .Marland Kitchen Pt stated he has not eaten anything different  nor used any different soap  ( BATHE ) but he  did  change his laundry   soap    Richard [name] is a 63 y.o. who presents to the clinic for an acute rash.  Per patient, this rash started about 2 weeks ago and has progressively worsened.  He has tried topical hydrocortisone cream is not tried any oral antihistamines such as Benadryl or Zyrtec.  He states that this rash begins every spring and worsened throughout the summer and will then resolve in the fall/winter time.  Patient reported that his rash usually progresses to this extent as well.  He describes it as extremely itchy.  He denies any potential for bedbugs. He does report using different laundry soap, but this was after the rash started. Please see problem based assessment and plan for additional details.       Objective:    BP (!) 121/58 (BP Location: Left Arm, Patient Position: Sitting, Cuff Size: Large)   Pulse 96   Temp 98.2 F (36.8 C) (Oral)   Ht 6\' 3"  (1.905 m)   Wt 227 lb 9.6 oz (103.2 kg)   SpO2 99%   BMI 28.45 kg/m  BP Readings from Last 3 Encounters:  01/20/24 (!) 121/58  01/01/24 127/71  12/16/23 (!) 122/58   Wt Readings from Last 3 Encounters:  01/20/24 227 lb 9.6 oz (103.2 kg)  01/16/24 236 lb (107 kg)  01/01/24 236 lb (107 kg)      Physical Exam Vitals reviewed.  Constitutional:      General: He is not in acute distress.    Appearance: He is not ill-appearing, toxic-appearing or diaphoretic.  Cardiovascular:     Rate and Rhythm: Normal rate and regular rhythm.     Heart sounds: Murmur (3/6 systolic murmur at RUSB) heard.  Pulmonary:     Effort: Pulmonary effort is normal.  No respiratory distress.     Breath sounds: Normal breath sounds. No stridor. No wheezing or rales.  Skin:    General: Skin is warm and dry.     Findings: Rash (Diffuse maculopapular rash present on trunk and extremities with hyperpigmented scarring, spares palms) present.  Neurological:     Mental Status: He is alert.  Psychiatric:        Mood and Affect: Mood and affect normal.        Behavior: Behavior normal. Behavior is cooperative.          Assessment & Plan:   Problem List Items Addressed This Visit       Musculoskeletal and Integument   Diffuse maculopapular rash - Primary   Per patient, this rash started about 2 weeks ago and has progressively worsened.  He has tried topical hydrocortisone cream is not tried any oral antihistamines such as Benadryl or Zyrtec.  He states that this rash begins every spring and worsened throughout the summer and will then resolve in the fall/winter time.  Patient reported that his rash usually progresses to this extent as well.  He describes it as  extremely itchy.  He denies any potential for bedbugs. He does report using different laundry soap, but this was after the rash started. On exam, he has a diffuse maculopapular rash on trunk and all 4 extremities.  He does not have this rash present on the palms.  There is evidence of hyperpigmentation and scarring from prior rash in the past.  I do suspect a form of either atopic dermatitis that is severe giving his underlying allergies and lung disease versus Pityriasis rosea.  Low suspicion for secondary syphilis given the resolution of the rash with the season changes and the lack of lesions on his bilateral palms.  Plan: -Triamcinolone cream 0.1% BID -Dermatology referral - Patient instructed to take Zyrtec daily and to take Benadryl every night      Relevant Orders   Ambulatory referral to Dermatology    Meds ordered this encounter  Medications   triamcinolone cream (KENALOG) 0.1 %    Sig:  Apply 1 Application topically 2 (two) times daily.    Dispense:  30 g    Refill:  3   diphenhydrAMINE (BENADRYL ALLERGY) 25 mg capsule    Sig: Take 1 capsule (25 mg total) by mouth at bedtime as needed for allergies or itching.    Dispense:  30 capsule    Refill:  0   cetirizine (ZYRTEC ALLERGY) 10 MG tablet    Sig: Take 1 tablet (10 mg total) by mouth daily.    Dispense:  30 tablet    Refill:  2    Return if symptoms worsen or fail to improve, for Rash .  Patient seen with Dr. Helen Loa, DO

## 2024-01-20 NOTE — Patient Instructions (Signed)
 Thank you, [name] for allowing us  to provide your care today. Today we discussed the diffuse rash.  Apply triamcinolone cream twice per day, please take a benadryl every night and a zyrtec every day. We have ordered a dermatology referral, please let us  know if you do not hear from them by Friday.    I have ordered the following medication/changed the following medications:   Stop the following medications: Medications Discontinued During This Encounter  Medication Reason   triamcinolone ointment (KENALOG) 0.5 % Dose change     Start the following medications: Meds ordered this encounter  Medications   triamcinolone cream (KENALOG) 0.1 %    Sig: Apply 1 Application topically 2 (two) times daily.    Dispense:  30 g    Refill:  3   diphenhydrAMINE (BENADRYL ALLERGY) 25 mg capsule    Sig: Take 1 capsule (25 mg total) by mouth at bedtime as needed for allergies or itching.    Dispense:  30 capsule    Refill:  0   cetirizine (ZYRTEC ALLERGY) 10 MG tablet    Sig: Take 1 tablet (10 mg total) by mouth daily.    Dispense:  30 tablet    Refill:  2     Follow up: 3 months prn    Should you have any questions or concerns please call the internal medicine clinic at [phone].     Please note that our late policy has changed.  If you are more than 15 minutes late to your appointment, you may be asked to reschedule your appointment.  Dr. Sharlon Deacon, D.O. [facility]

## 2024-01-20 NOTE — Telephone Encounter (Signed)
 Third attempt to contact patient- call disconnected. Unable to reach patient- sent to office for follow up.

## 2024-01-21 ENCOUNTER — Other Ambulatory Visit

## 2024-01-21 NOTE — Progress Notes (Signed)
Internal Medicine Clinic Attending  I was physically present during the key portions of the resident provided service and participated in the medical decision making of patient's management care. I reviewed pertinent patient test results.  The assessment, diagnosis, and plan were formulated together and I agree with the documentation in the resident's note.  [name], MD

## 2024-01-30 DIAGNOSIS — L281 Prurigo nodularis: Secondary | ICD-10-CM | POA: Diagnosis not present

## 2024-01-30 DIAGNOSIS — L2089 Other atopic dermatitis: Secondary | ICD-10-CM | POA: Diagnosis not present

## 2024-02-03 ENCOUNTER — Other Ambulatory Visit: Payer: Self-pay

## 2024-02-04 ENCOUNTER — Telehealth: Payer: Self-pay | Admitting: *Deleted

## 2024-02-04 MED ORDER — ESCITALOPRAM OXALATE 5 MG PO TABS: 5.0000 mg | ORAL_TABLET | Freq: Every day | ORAL | 2 refills | Status: DC

## 2024-02-04 NOTE — Telephone Encounter (Signed)
 Copied from CRM [id number]. Topic: General - Other >> Feb 04, 2024  3:36 PM Brittney F wrote: Reason for CRM:   What is the name of the person calling?   Starling Eck, Case Manager   Where are you calling from?  [employer] concerning patient:  Calling to introduce herself as a new Sports coach for the patient; She will check in with him every six months to ensure he is getting the necessary care at home.    Contact information:  Ph: [phone] ex [phone]

## 2024-02-10 ENCOUNTER — Ambulatory Visit: Payer: Self-pay | Admitting: Internal Medicine

## 2024-02-10 NOTE — Telephone Encounter (Signed)
 This encounter was created in error - please disregard.

## 2024-02-10 NOTE — Telephone Encounter (Signed)
 Chief Complaint: Nightmares for a while, getting worse x1 month  Pertinent Negatives: Patient denies stress, pain  Disposition:  [x] Appointment (In office)  Additional Notes: Patient scheduled for an appointment this Friday as pt states that day works best. This RN educated pt on new-worsening symptoms and when to call back/seek emergent care. Pt verbalized understanding and agrees to plan.     Copied from CRM [id number]. Topic: Clinical - Red Word Triage >> Feb 10, 2024 11:18 AM Danelle Dunning F wrote: [facility] that prompted transfer to Nurse Triage:   Severe violent dream; looking to have a sleep study put in Reason for Disposition  Awakened  by jerking leg movements  Answer Assessment - Initial Assessment Questions DESCRIPTION: "Tell me about your sleeping problem."      Nightmares that is causing pt to start fighting in sleep ONSET: "How long have you been having trouble sleeping?" (e.g., days, weeks, months)     Longer than 6 months STRESS: "Is there anything in your life that is making you feel stressed or tense?"     Denies PAIN: "Do you have any pain that is keeping you awake?" (e.g., back pain, headache, abdomen pain)     Denies CAFFEINE ABUSE: "Do you drink caffeinated beverages, and how much each day?" (e.g., coffee, tea, colas)    A cup of coffee once a morning ALCOHOL USE OR SUBSTANCE USE (DRUG USE): "Do you drink alcohol or use any illegal drugs?"     Denies OTHER SYMPTOMS: "Do you have any other symptoms?"  (e.g., difficulty breathing)     Patient has asthma  Protocols used: Insomnia-A-AH

## 2024-02-10 NOTE — Telephone Encounter (Signed)
 Pt's appt is Friday 5/9 with Dr Abner Hoffman.

## 2024-02-14 ENCOUNTER — Ambulatory Visit (INDEPENDENT_AMBULATORY_CARE_PROVIDER_SITE_OTHER): Payer: Self-pay | Admitting: Student

## 2024-02-14 VITALS — BP 118/62 | HR 95 | Temp 98.1°F | Ht 75.0 in | Wt 224.1 lb

## 2024-02-14 DIAGNOSIS — I10 Essential (primary) hypertension: Secondary | ICD-10-CM

## 2024-02-14 DIAGNOSIS — G4752 REM sleep behavior disorder: Secondary | ICD-10-CM | POA: Insufficient documentation

## 2024-02-14 DIAGNOSIS — D509 Iron deficiency anemia, unspecified: Secondary | ICD-10-CM

## 2024-02-14 DIAGNOSIS — M1711 Unilateral primary osteoarthritis, right knee: Secondary | ICD-10-CM | POA: Diagnosis not present

## 2024-02-14 DIAGNOSIS — E1151 Type 2 diabetes mellitus with diabetic peripheral angiopathy without gangrene: Secondary | ICD-10-CM | POA: Diagnosis not present

## 2024-02-14 LAB — POCT GLYCOSYLATED HEMOGLOBIN (HGB A1C): Hemoglobin A1C: 7.6 % — AB (ref 4.0–5.6)

## 2024-02-14 LAB — GLUCOSE, CAPILLARY: Glucose-Capillary: 105 mg/dL — ABNORMAL HIGH (ref 70–99)

## 2024-02-14 NOTE — Progress Notes (Signed)
 Internal Medicine Clinic Attending  Case discussed with the resident at the time of the visit.  We reviewed the resident's history and exam and pertinent patient test results.  I agree with the assessment, diagnosis, and plan of care documented in the resident's note.    Agree with Dr McLendon's differential diagnosis including REM sleep disorder vs OSA. Agree with sleep study as next best step. Patient has follow-up scheduled with his PCP Dr Adriane Albe next month. His last few visits have all focused on acute issues, so he is due for routine followup of chronic medical conditions.

## 2024-02-14 NOTE — Assessment & Plan Note (Signed)
 History of iron  deficiency anemia.  Has had iron  infusions and takes an iron  supplement.  Repeat CBC and iron  studies today.  If he is still anemic he should follow-up with GI, this was arranged last year but an appointment was never made.
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
## 2024-02-14 NOTE — Assessment & Plan Note (Signed)
 A1c 7.6.  He will follow-up with his primary care doctor about this.  Urine microalbumin today.

## 2024-02-14 NOTE — Assessment & Plan Note (Signed)
 History strongly suggestive of REM sleep behavior disorder.  Differential includes other parasomnias, nightmare disorder, OSA.  Doubt PTSD without any history of major trauma.  Recommend sleep study with video polysomnography.  Can try melatonin in the meantime.  Ensure safe sleep environment.  Pertinent review of systems include negative responses for anosmia, constipation, hallucination and positive responses for lightheadedness on standing and tremor (although not at rest).  I talked with Richard [name] and his significant other about the differential diagnosis and about the association of REM sleep disorder with Parkinson's disease, dementia with Lewy bodies, etc.

## 2024-02-14 NOTE — Addendum Note (Signed)
 Addended by: Jacqulene Huntley L on: 02/14/2024 10:24 AM   Modules accepted: Level of Service

## 2024-02-14 NOTE — Assessment & Plan Note (Signed)
Normal BP today!

## 2024-02-14 NOTE — Patient Instructions (Addendum)
 I'll check some labs today.  If your iron  and blood counts are low, I will refer you to gastroenterology for colonoscopy.  You need a sleep test for diagnosis of your sleep problem. The sleep center will call you about setting this up. In the meantime try melatonin and make sure the area around your bed is safe.  Remember to bring all of the medications that you take (including over the counter medications and supplements) with you to every clinic visit.  This after visit summary is an important review of tests, referrals, and medication changes that were discussed during your visit. If you have questions or concerns, call [phone]. Outside of clinic business hours, call the main hospital at [phone] and ask the operator for the on-call internal medicine resident.   Adria Hopkins MD 02/14/2024, 10:02 AM

## 2024-02-15 LAB — CBC WITH DIFFERENTIAL/PLATELET
Basophils Absolute: 0.1 x10E3/uL (ref 0.0–0.2)
Basos: 2 %
EOS (ABSOLUTE): 0.5 x10E3/uL — ABNORMAL HIGH (ref 0.0–0.4)
Eos: 10 %
Hematocrit: 36.3 % — ABNORMAL LOW (ref 37.5–51.0)
Hemoglobin: 11 g/dL — ABNORMAL LOW (ref 13.0–17.7)
Immature Grans (Abs): 0 x10E3/uL (ref 0.0–0.1)
Immature Granulocytes: 0 %
Lymphocytes Absolute: 0.8 x10E3/uL (ref 0.7–3.1)
Lymphs: 16 %
MCH: 23 pg — ABNORMAL LOW (ref 26.6–33.0)
MCHC: 30.3 g/dL — ABNORMAL LOW (ref 31.5–35.7)
MCV: 76 fL — ABNORMAL LOW (ref 79–97)
Monocytes Absolute: 0.6 x10E3/uL (ref 0.1–0.9)
Monocytes: 11 %
Neutrophils Absolute: 3 x10E3/uL (ref 1.4–7.0)
Neutrophils: 61 %
Platelets: 335 x10E3/uL (ref 150–450)
RBC: 4.78 x10E6/uL (ref 4.14–5.80)
RDW: 15 % (ref 11.6–15.4)
WBC: 4.9 x10E3/uL (ref 3.4–10.8)

## 2024-02-15 LAB — BMP8+ANION GAP
Anion Gap: 12 mmol/L (ref 10.0–18.0)
BUN/Creatinine Ratio: 17 (ref 10–24)
BUN: 17 mg/dL (ref 8–27)
CO2: 23 mmol/L (ref 20–29)
Calcium: 9.3 mg/dL (ref 8.6–10.2)
Chloride: 101 mmol/L (ref 96–106)
Creatinine, Ser: 0.99 mg/dL (ref 0.76–1.27)
Glucose: 89 mg/dL (ref 70–99)
Potassium: 5.3 mmol/L — ABNORMAL HIGH (ref 3.5–5.2)
Sodium: 136 mmol/L (ref 134–144)
eGFR: 85 mL/min/1.73

## 2024-02-15 LAB — IRON AND TIBC
Iron Saturation: 5 % — CL (ref 15–55)
Iron: 23 ug/dL — ABNORMAL LOW (ref 38–169)
Total Iron Binding Capacity: 432 ug/dL (ref 250–450)
UIBC: 409 ug/dL — ABNORMAL HIGH (ref 111–343)

## 2024-02-15 LAB — FERRITIN: Ferritin: 11 ng/mL — ABNORMAL LOW (ref 30–400)

## 2024-02-16 LAB — MICROALBUMIN / CREATININE URINE RATIO
Creatinine, Urine: 90.4 mg/dL
Microalb/Creat Ratio: <3 mg/g{creat} (ref 0–29)
Microalbumin, Urine: <3 ug/mL

## 2024-02-17 ENCOUNTER — Other Ambulatory Visit: Payer: Self-pay | Admitting: Student

## 2024-02-17 DIAGNOSIS — D509 Iron deficiency anemia, unspecified: Secondary | ICD-10-CM

## 2024-02-18 ENCOUNTER — Ambulatory Visit: Attending: Internal Medicine | Admitting: Physical Therapy

## 2024-02-18 DIAGNOSIS — M1711 Unilateral primary osteoarthritis, right knee: Secondary | ICD-10-CM | POA: Insufficient documentation

## 2024-02-18 DIAGNOSIS — G8929 Other chronic pain: Secondary | ICD-10-CM | POA: Diagnosis not present

## 2024-02-18 DIAGNOSIS — M25561 Pain in right knee: Secondary | ICD-10-CM | POA: Diagnosis not present

## 2024-02-18 DIAGNOSIS — M25661 Stiffness of right knee, not elsewhere classified: Secondary | ICD-10-CM | POA: Insufficient documentation

## 2024-02-18 DIAGNOSIS — R262 Difficulty in walking, not elsewhere classified: Secondary | ICD-10-CM | POA: Diagnosis not present
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
## 2024-02-19 ENCOUNTER — Other Ambulatory Visit

## 2024-02-20 ENCOUNTER — Encounter: Payer: Self-pay | Admitting: Pulmonary Disease

## 2024-02-20 ENCOUNTER — Ambulatory Visit: Admitting: Pulmonary Disease

## 2024-02-20 VITALS — BP 115/70 | HR 105 | Temp 98.4°F | Ht 75.0 in | Wt 220.8 lb

## 2024-02-20 DIAGNOSIS — J4550 Severe persistent asthma, uncomplicated: Secondary | ICD-10-CM

## 2024-02-20 DIAGNOSIS — Z87891 Personal history of nicotine dependence: Secondary | ICD-10-CM

## 2024-02-20 DIAGNOSIS — Z01811 Encounter for preprocedural respiratory examination: Secondary | ICD-10-CM

## 2024-02-20 DIAGNOSIS — M1711 Unilateral primary osteoarthritis, right knee: Secondary | ICD-10-CM | POA: Diagnosis not present

## 2024-02-20 DIAGNOSIS — L209 Atopic dermatitis, unspecified: Secondary | ICD-10-CM | POA: Diagnosis not present

## 2024-02-20 DIAGNOSIS — J8283 Eosinophilic asthma: Secondary | ICD-10-CM

## 2024-02-20 DIAGNOSIS — J4489 Other specified chronic obstructive pulmonary disease: Secondary | ICD-10-CM | POA: Diagnosis not present

## 2024-02-20 MED ORDER — PREDNISONE 10 MG PO TABS: 40.0000 mg | ORAL_TABLET | Freq: Every day | ORAL | 0 refills | Status: AC

## 2024-02-20 NOTE — Progress Notes (Addendum)
 Virginio Mcauley    [id number]    07-02-1961  Primary Care Physician:Hoffman, Azell Boll, DO  Referring Physician: Priscella Brooms, DO [address]  [place],  [place] [address]  Chief complaint:  Consult for asthma, COPD Needs preop clearance for knee surgery  HPI: 63 y.o. 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  Discussed the use of AI scribe software for clinical note transcription with the patient, who gave verbal consent to proceed.  History of Present Illness Richard [name] is a 63 year old male with severe persistent asthma, COPD, and diabetes who presents for pre-operative clearance for knee surgery. He is accompanied by his granddaughter. He was referred by his primary care doctor for pre-operative clearance.  He is seeking pre-operative clearance for knee surgery, which was postponed last year due to complications with his A1c, heart, and breathing. He has been cleared by a vascular specialist and is now seeking clearance for his asthma, COPD  He has severe persistent asthma and COPD. He uses an albuterol  inhaler and solution, and Breztri  twice daily. He was previously on Nucala  but has not received it for over a month. He started Dupixent today for eczema, which is affecting his back and chest. He experiences  increased shortness of breath today due to humidity, with chest tightness and wheezing. He is also on Singulair . He is allergic to pollen, which exacerbates his asthma symptoms.  He is undergoing evaluation for sleep disturbances, including fighting in his sleep and having vivid dreams. A sleep study is planned by his primary care doctor to assess for REM sleep behavior disorder.  He monitors his blood sugar with a Libre 3 device, which alerts him if levels are too high or low. He is concerned about prednisone  affecting his blood sugar levels.  Pets: No pets Occupation: On disability.  Used to work as a Estate agent Exposures: No mold, hot tub, Financial controller.  No feather pillows or comforters No h/o chemo/XRT/amiodarone/macrodantin/MTX  No exposure to asbestos, silica or other organic allergens  Smoking history: 45-pack-year smoker.  Quit in 2023 Travel history: Originally from [place] .  No significant recent travel Relevant family history: No family history of lung disease   Outpatient Encounter Medications as of 02/20/2024  Medication Sig   Accu-Chek Softclix Lancets lancets 1 each by Other route See admin instructions. 1-2 times daily   albuterol  (PROVENTIL ) (2.5 MG/3ML) 0.083% nebulizer solution Take 3 mLs (2.5 mg total) by nebulization 2 (two) times daily.   albuterol  (VENTOLIN  HFA) 108 (90 Base) MCG/ACT inhaler Inhale 1-2 puffs into the lungs every 4 (four) hours as needed for wheezing or shortness of breath.   aspirin  EC 81 MG EC tablet Take 1 tablet (81 mg total) by mouth daily.   atorvastatin  (LIPITOR) 40 MG tablet Take 1 tablet (40 mg total) by mouth daily.   benzonatate  (TESSALON ) 200 MG capsule Take 1 capsule (200 mg total) by mouth 3 (three) times daily as needed for cough.  Budeson-Glycopyrrol-Formoterol  (BREZTRI  AEROSPHERE) 160-9-4.8 MCG/ACT AERO Inhale 2 puffs into the lungs in the morning and at bedtime.   cetirizine  (ZYRTEC  ALLERGY) 10 MG tablet Take 1 tablet (10 mg total)  by mouth daily.   clopidogrel  (PLAVIX ) 75 MG tablet Take 1 tablet (75 mg total) by mouth daily.   Continuous Glucose Sensor (FREESTYLE LIBRE 3 SENSOR) MISC Place 1 sensor on the skin every 14 days. Use to check glucose continuously   Dulaglutide  (TRULICITY ) 4.5 MG/0.5ML SOAJ Inject 4.5 mg as directed once a week.   DUPIXENT 300 MG/2ML SOAJ    ferrous sulfate  325 (65 FE) MG tablet Take 1 tablet (325 mg total) by mouth daily.   fluticasone  (FLONASE ) 50 MCG/ACT nasal spray Place 1 spray into both nostrils daily. (Patient taking differently: Place 1 spray into both nostrils as needed for allergies.)   glucose blood test strip 1 each by Other route in the morning and at bedtime.   insulin  degludec (TRESIBA ) 100 UNIT/ML FlexTouch Pen Inject 20 Units into the skin daily.   Insulin  Pen Needle (PEN NEEDLES) 32G X 4 MM MISC 1 each by Does not apply route daily.   lidocaine  (LIDODERM ) 5 % Place 1 patch onto the skin daily. Remove & Discard patch within 12 hours or as directed by MD   meloxicam  (MOBIC ) 15 MG tablet Take 1 tablet (15 mg total) by mouth daily as needed for pain.   menthol -cetylpyridinium (CEPACOL) 3 MG lozenge Take 1 lozenge (3 mg total) by mouth as needed for sore throat.   montelukast  (SINGULAIR ) 10 MG tablet Take 1 tablet (10 mg total) by mouth at bedtime.   mupirocin  ointment (BACTROBAN ) 2 % Place 1 Application into the nose 2 (two) times daily.   pantoprazole  (PROTONIX ) 40 MG tablet Take 1 tablet (40 mg total) by mouth daily.   phenol (CHLORASEPTIC) 1.4 % LIQD Use as directed 1 spray in the mouth or throat as needed for throat irritation / pain.   polyethylene glycol powder (GLYCOLAX /MIRALAX ) 17 GM/SCOOP powder Take 17 g by mouth daily.   pregabalin  (LYRICA ) 100 MG capsule Take 1 capsule (100 mg total) by mouth 3 (three) times daily.   triamcinolone  cream (KENALOG ) 0.1 % Apply 1 Application topically 2 (two) times daily.   diphenhydrAMINE  (BENADRYL  ALLERGY) 25 mg capsule Take 1 capsule  (25 mg total) by mouth at bedtime as needed for allergies or itching. (Patient not taking: Reported on 02/20/2024)   escitalopram  (LEXAPRO ) 5 MG tablet Take 1 tablet (5 mg total) by mouth daily. (Patient not taking: Reported on 02/20/2024)   ipratropium-albuterol  (DUONEB) 0.5-2.5 (3) MG/3ML SOLN Use 1 vial (3 mLs) by nebulization every 6 (six) hours as needed.   Mepolizumab  (NUCALA ) 100 MG/ML SOAJ Inject 1 mL (100 mg total) into the skin every 28 (twenty-eight) days. (Patient not taking: Reported on 02/20/2024)   No facility-administered encounter medications on file as of 02/20/2024.    Physical Exam: Blood pressure 115/70, pulse (!) 105, temperature 98.4 F (36.9 C), temperature source Oral, height 6\' 3"  (1.905 m), weight 220 lb 12.8 oz (100.2 kg), SpO2 98%. Gen:      No acute distress HEENT:  EOMI, sclera anicteric Neck:     No masses; no thyromegaly Lungs:    Bilateral expiratory wheeze CV:         Regular rate and rhythm; no murmurs Abd:      + bowel sounds; soft, non-tender; no palpable masses, no distension Ext:    No edema; adequate peripheral perfusion Skin:  Warm and dry; no rash Neuro: alert and oriented x 3 Psych: normal mood and affect  Data Reviewed: Imaging: CTA 02/10/2023 No evidence of pulmonary embolism, emphysema, no acute parenchymal lung abnormality I have reviewed the images personally.  PFTs: 12/07/2022 FVC 3.37 [64%], FEV1 2.48 [63%],/F73, TLC 6.35 [83%], DLCO 20.08 [67%] Air trapping, bronchodilator response suggestive of asthma.  Mild diffusion impairment  Labs: CBC 02/14/2024-WBC 4.9, eos 10%, absolute eosinophil count 419 RAST panel 02/21/2018-IgE 886, multiple allergies noted to dog, dust mite, mold, pollen Assessment & Plan Severe persistent asthma with exacerbation Asthma is not well-controlled, with increased wheezing and chest tightness, likely exacerbated by humidity and pollen exposure.  Continues on Breztri , Singulair .  He was on Nucala  for asthma  but has not received it for over a month.  Recently Dupixent was started by his dermatologist to treat eczema which will help with asthma control.  Since he is already on Dupixent we will not restart Nucala .. Prednisone  is considered to improve asthma control but may affect blood glucose levels. He is allergic to pollen, contributing to exacerbations.  - Prescribe prednisone  for 5 days to improve asthma control.  Instructed to monitor his blood sugar and adjust diabetes medications as needed - Continue Singulair , Breztri , Dupixent therapy.  Chronic obstructive pulmonary disease (COPD) COPD coexists with asthma, contributing to respiratory symptoms. Current medications include Breztri  and albuterol  inhaler, used regularly. Significant tobacco use may have contributed to COPD and asthma severity.  Atopic dermatitis Atopic dermatitis is treated with Dupixent, started today, primarily affecting the back and chest. Dupixent, prescribed by dermatology, may also aid in asthma control. - Continue Dupixent therapy as prescribed by dermatology.  Suspected sleep disorder May have OSA or REM sleep behavior disorder In lab sleep study already ordered by primary care  Preop evaluation for knee surgery Suggest that we hold off on knee surgery until his asthma is under better control and he has been fully evaluated for sleep disorders and started on CPAP or NIV as needed.  Return to clinic in 4 to 8 weeks to reevaluate  Follow-up Follow-up is necessary to evaluate asthma control and surgical readiness. A sleep study is pending to assess for potential sleep apnea, REM sleep disorder, which should be addressed before knee surgery. Clearance is contingent on better asthma control and evaluation of sleep issues. - Schedule follow-up appointment in 4 to 8 weeks to reassess asthma control and surgical readiness.  Recommendations: Continue Breztri , Singulair  Prednisone  40 mg a day for 5 days Continue Dupixent.   Hold off on re starting Nucala  Follow results of sleep study  Reassess in 4-8 weeks  Phyllis Breeze MD [facility] 02/20/2024, 3:36 PM  CC: Priscella Brooms, DO

## 2024-02-20 NOTE — Patient Instructions (Signed)
 VISIT SUMMARY:  Today, you came in for pre-operative clearance for your upcoming knee surgery. We discussed your severe persistent asthma, COPD, diabetes, and other health concerns. You have started Dupixent for your asthma and eczema, and we have prescribed prednisone  to help control your asthma symptoms. We also talked about your pollen allergy and its impact on your asthma. Additionally, we reviewed your diabetes management and the potential effects of prednisone  on your blood sugar levels. Lastly, we discussed your recent smoking cessation and the need for a sleep study to evaluate your sleep disturbances.  YOUR PLAN:  -SEVERE PERSISTENT ASTHMA: Severe persistent asthma is a long-term condition where your airways are always inflamed and can become even more swollen and narrow when exposed to triggers like pollen and humidity. We have prescribed prednisone  for 5 days to help control your asthma symptoms and you should continue using Dupixent as directed.  -CHRONIC OBSTRUCTIVE PULMONARY DISEASE (COPD): COPD is a chronic lung disease that makes it hard to breathe due to long-term damage to the lungs, often from smoking. You should continue using your Breztri  and albuterol  inhaler regularly to manage your symptoms.  -ALLERGY TO POLLEN: Your allergy to pollen can worsen your asthma symptoms, especially during high pollen seasons. Try to avoid exposure to pollen as much as possible to help control your asthma.  -TYPE 2 DIABETES MELLITUS: Type 2 diabetes is a condition where your body does not use insulin  properly, leading to high blood sugar levels. You should closely monitor your blood sugar levels, especially while taking prednisone , as it can raise your blood sugar. Use your [place] 3 device to help with this.  -ATOPIC DERMATITIS: Atopic dermatitis, also known as eczema, is a condition that makes your skin red and itchy. You have started Dupixent today to help manage this condition, and it may also help  with your asthma. Continue using Dupixent as prescribed by your dermatologist.   INSTRUCTIONS:  Please schedule a follow-up appointment in 4 to 8 weeks with a nurse practitioner to reassess your asthma control and surgical readiness. Additionally, ensure you complete the in-lab sleep study to evaluate for sleep apnea before your knee surgery.

## 2024-02-21 ENCOUNTER — Telehealth: Payer: Self-pay | Admitting: *Deleted

## 2024-02-21 ENCOUNTER — Other Ambulatory Visit: Payer: Self-pay | Admitting: Student

## 2024-02-21 ENCOUNTER — Telehealth: Payer: Self-pay | Admitting: Pulmonary Disease

## 2024-02-21 DIAGNOSIS — G4752 REM sleep behavior disorder: Secondary | ICD-10-CM

## 2024-02-21 NOTE — Telephone Encounter (Signed)
 Fax received from Dr. Priscille Brought with [facility] to perform a right total knee arthroplasty under spinal anesthesia on patient.  Patient needs surgery clearance. Surgery is pending. Patient was seen on 02/20/24. Office protocol is a risk assessment can be sent to surgeon if patient has been seen in 60 days or less.   During ov 02/20/24 pt was unable to be cleared. We have him rescheduled for eval of 04/02/24 and will determine risk assessment then.   I have faxed the ov note from 02/20/24 to Ortho and notified them of pt's next appt.

## 2024-02-21 NOTE — Telephone Encounter (Signed)
   Pre-operative Risk Assessment    Patient Name: Richard [name]  DOB: 08-18-1961 MRN: [id number]   Date of last office visit: 10/18/23 DR. COOPER Date of next office visit: 04/13/24 KATHRYN THOMPSON, [facility]   Request for Surgical Clearance    Procedure:  RIGHT TOTAL KNEE ARTHROPLASTY  Date of Surgery:  Clearance 04/02/24                                Surgeon:  DR. DANIEL MARCHWIANY Surgeon's Group or Practice Name:  [facility] Phone number:  [phone] EXT [address] Fax number:  [phone]   Type of Clearance Requested:   - Medical  - Pharmacy:  Hold Aspirin  and Clopidogrel  (Plavix )     Type of Anesthesia:  Spinal   Additional requests/questions:    Richard [name]   02/21/2024, 8:41 AM

## 2024-02-21 NOTE — Telephone Encounter (Signed)
 Left message to call back to schedule sooner appt with APP for preop clearance. Pt to keep his appt with Abagail Hoar, [facility] 04/13/24.

## 2024-02-21 NOTE — Telephone Encounter (Signed)
   Name: Richard [name]  DOB: May 15, 1961  MRN: [id number]  Primary Cardiologist: Arnoldo Lapping, MD  Chart reviewed as part of pre-operative protocol coverage. Because of Richard [name] past medical history and time since last visit, he will require a follow-up in-office visit in order to better assess preoperative cardiovascular risk.  Per Slater Duncan, NP on 09/03/23: "Advised that we will likely need to get Lexiscan  Myoview  for preop clearance. Advised him to notify us  when surgery is scheduled." He was to f/u in 6 months after that visit. He will need OV with general cardiology.   Regarding ASA therapy, it may be stopped 5-7 days prior to surgery with a plan to resume it as soon as felt to be feasible from a surgical standpoint in the post-operative period.    Patient is on Plavix  for peripheral artery disease and guidance will have to come from prescribing provider.  Pre-op covering staff: - Please schedule appointment and call patient to inform them. If patient already had an upcoming appointment within acceptable timeframe, please add "pre-op clearance" to the appointment notes so provider is aware. - Please contact requesting surgeon's office via preferred method (i.e, phone, fax) to inform them of need for appointment prior to surgery.    Ava Boatman, NP  02/21/2024, 11:16 AM

## 2024-02-24 ENCOUNTER — Telehealth: Payer: Self-pay | Admitting: *Deleted

## 2024-02-24 NOTE — Telephone Encounter (Signed)
 2nd attempt to reach the pt to schedule sooner appt for preop clearance. Pt can keep his appt 04/13/24 with Friddie Jetty, PAC, though can see APP sooner for the preop clearance.

## 2024-02-24 NOTE — Telephone Encounter (Signed)
 Copied from CRM [id number]. Topic: General - Other >> Feb 20, 2024  9:08 AM Shelby Dessert H wrote: Reason for CRM: Patient called and wanted the provider to be aware that he was having surgery and to look out for some papers he needs to sign clearing him for his surgery.

## 2024-02-25 NOTE — Telephone Encounter (Signed)
 I s/w the pt and he has been scheduled 03/03/24 @ 1:55 to see Marlana Silvan, NP for preop clearance. Pt will keep his appt with Abagail Hoar, [facility] 04/13/24. Pt thanked me for the call and the help.   I will update all parties pt has appt 03/03/24 @ 1:55 Marlana Silvan, NP

## 2024-02-26 ENCOUNTER — Ambulatory Visit (INDEPENDENT_AMBULATORY_CARE_PROVIDER_SITE_OTHER): Admitting: Podiatry

## 2024-02-26 ENCOUNTER — Encounter: Payer: Self-pay | Admitting: Podiatry

## 2024-02-26 DIAGNOSIS — L84 Corns and callosities: Secondary | ICD-10-CM

## 2024-02-26 DIAGNOSIS — M2142 Flat foot [pes planus] (acquired), left foot: Secondary | ICD-10-CM | POA: Diagnosis not present

## 2024-02-26 DIAGNOSIS — E1140 Type 2 diabetes mellitus with diabetic neuropathy, unspecified: Secondary | ICD-10-CM

## 2024-02-26 DIAGNOSIS — L03119 Cellulitis of unspecified part of limb: Secondary | ICD-10-CM | POA: Diagnosis not present

## 2024-02-26 DIAGNOSIS — M2141 Flat foot [pes planus] (acquired), right foot: Secondary | ICD-10-CM

## 2024-02-26 DIAGNOSIS — L02619 Cutaneous abscess of unspecified foot: Secondary | ICD-10-CM

## 2024-02-26 DIAGNOSIS — Z89432 Acquired absence of left foot: Secondary | ICD-10-CM

## 2024-02-26 MED ORDER — DOXYCYCLINE HYCLATE 100 MG PO TABS: 100.0000 mg | ORAL_TABLET | Freq: Two times a day (BID) | ORAL | 0 refills | Status: DC

## 2024-02-26 NOTE — Progress Notes (Signed)
 This patient presents to the office with chief complaint of thick callus which develops on his right forefoot.   and diabetic feet. He says  This patient  says there  is  no pain and discomfort in his feet due to his neuropathy. This patient says he has had amputation left forefoot previously.   These nails are painful walking and wearing shoes.    Patient is unable to  self treat his own callus  . This patient presents  to the office today for treatment of the  long nails and a foot evaluation due to history of  diabetes.  General Appearance  Alert, conversant and in no acute stress.  Vascular  Dorsalis pedis and posterior tibial  pulses are palpable  right    Temperature is within normal limits  bilaterally.  Neurologic  Senn-Weinstein monofilament test diminished bilaterally. Muscle power within normal limits bilaterally.  Nails Thick disfigured discolored nails with subungual debris  from hallux to fifth toes right foot.. No evidence of bacterial infection or drainage bilaterally.  Orthopedic  No limitations of motion of motion feet .  No crepitus or effusions noted.  No bony pathology or digital deformities noted. TMA left foot.  Skin  normotropic skin with noted bilaterally.  No signs of infections or ulcers noted.   Porokeratosis sub 2 right foot.  Presence of blood filled abscess right forefoot.    Callus  right forefoot.  Abscess distal to callus right forefoot.  Debride callus right foot with nail nipper and dremel tool.   Incision and Drainage abscess right forefoot with # 15 blade.  Red thick material noted in the abscess.  Neosporin/DSD.  Prescribe doxycycline .  RTC 10 weeks for callus care.    Patient was dispensed his diabetic shoes from  [place].   Ruffin Cotton DPM

## 2024-02-27 ENCOUNTER — Encounter: Admitting: Internal Medicine

## 2024-02-28 ENCOUNTER — Ambulatory Visit (INDEPENDENT_AMBULATORY_CARE_PROVIDER_SITE_OTHER)

## 2024-02-28 ENCOUNTER — Ambulatory Visit (INDEPENDENT_AMBULATORY_CARE_PROVIDER_SITE_OTHER): Admitting: Podiatry

## 2024-02-28 ENCOUNTER — Ambulatory Visit

## 2024-02-28 DIAGNOSIS — M7752 Other enthesopathy of left foot: Secondary | ICD-10-CM

## 2024-02-28 DIAGNOSIS — M7751 Other enthesopathy of right foot: Secondary | ICD-10-CM

## 2024-02-28 NOTE — Progress Notes (Signed)
 Patient was seen in office to check on his ulcer/abscess right foot.  He was also scheduled for x-ray right foot.  General Appearance  Alert, conversant and in no acute stress.  Vascular  Dorsalis pedis and posterior tibial  pulses are palpable  bilaterally.  Capillary return is within normal limits  bilaterally. Temperature is within normal limits  bilaterally.  Neurologic  Senn-Weinstein monofilament wire test diminished  bilaterally. Muscle power within normal limits bilaterally.  Nails Thick disfigured discolored nails with subungual debris  from hallux to fifth toes bilaterally. No evidence of bacterial infection or drainage bilaterally.  Orthopedic  No limitations of motion  feet .  No crepitus or effusions noted.  No bony pathology or digital deformities noted.  Skin  normotropic skin noted bilaterally.  No signs of infections or ulcers noted.   Healing of blister sub 2,3 right foot.  Pre-ulcerous callus right forefoot..  ROV.  Xray reveal no bony pathology to met. heads right foot.  RTC 10 weeks  .  Ruffin Cotton DPM

## 2024-03-03 ENCOUNTER — Other Ambulatory Visit: Payer: Self-pay | Admitting: Nurse Practitioner

## 2024-03-03 ENCOUNTER — Ambulatory Visit: Attending: Nurse Practitioner | Admitting: Nurse Practitioner

## 2024-03-03 ENCOUNTER — Encounter: Payer: Self-pay | Admitting: Nurse Practitioner

## 2024-03-03 VITALS — BP 108/66 | HR 97 | Ht 75.0 in | Wt 230.6 lb

## 2024-03-03 DIAGNOSIS — I10 Essential (primary) hypertension: Secondary | ICD-10-CM

## 2024-03-03 DIAGNOSIS — I350 Nonrheumatic aortic (valve) stenosis: Secondary | ICD-10-CM

## 2024-03-03 DIAGNOSIS — Z01810 Encounter for preprocedural cardiovascular examination: Secondary | ICD-10-CM

## 2024-03-03 DIAGNOSIS — E785 Hyperlipidemia, unspecified: Secondary | ICD-10-CM

## 2024-03-03 DIAGNOSIS — R072 Precordial pain: Secondary | ICD-10-CM

## 2024-03-03 DIAGNOSIS — J4489 Other specified chronic obstructive pulmonary disease: Secondary | ICD-10-CM

## 2024-03-03 DIAGNOSIS — I050 Rheumatic mitral stenosis: Secondary | ICD-10-CM

## 2024-03-03 DIAGNOSIS — I739 Peripheral vascular disease, unspecified: Secondary | ICD-10-CM

## 2024-03-03 DIAGNOSIS — I2510 Atherosclerotic heart disease of native coronary artery without angina pectoris: Secondary | ICD-10-CM

## 2024-03-03 DIAGNOSIS — E1159 Type 2 diabetes mellitus with other circulatory complications: Secondary | ICD-10-CM
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
## 2024-03-03 NOTE — Patient Instructions (Addendum)
 Medication Instructions:  Your physician recommends that you continue on your current medications as directed. Please refer to the Current Medication list given to you today.  *If you need a refill on your cardiac medications before your next appointment, please call your pharmacy*  Lab Work: NONE ordered at this time of appointment   Testing/Procedures: Your physician has requested that you have a lexiscan  myoview . For further information please visit [email or web address] Please follow instruction sheet, as given.    Follow-Up: At [facility], you and your health needs are our priority.  As part of our continuing mission to provide you with exceptional heart care, our providers are all part of one team.  This team includes your primary Cardiologist (physician) and Advanced Practice Providers or APPs (Physician Assistants and Nurse Practitioners) who all work together to provide you with the care you need, when you need it.  Your next appointment:   Keep appointment with Abagail Hoar (PA) 6 month ov (Dr. Arlester Ladd)  Provider:   Arnoldo Lapping, MD or      Abagail Hoar (PA)     We recommend signing up for the patient portal called "MyChart".  Sign up information is provided on this After Visit Summary.  MyChart is used to connect with patients for Virtual Visits (Telemedicine).  Patients are able to view lab/test results, encounter notes, upcoming appointments, etc.  Non-urgent messages can be sent to your provider as well.   To learn more about what you can do with MyChart, go to [email or web address].   Other Instructions How to Prepare for Your Myoview  Test (stress test):  1. You don't have to Stop any medications (please note if this is an exercise test pt should hold beta blocker prior) 2.Your remaining medications may be taken with water. 3.Nothing to eat or drink, except water, 4 hours prior to arrival time.  NO caffeine/decaffeinated products, or chocolate 12  hours prior to arrival. 4.Ladies, please do not wear dresses.  Skirts or pants are approprate, please wear a short sleeve shirt. 5.NO perfume, cologne or lotion 6.Wear comfortable walking shoes.  NO HEELS! 7.Total time is 3 to 4 hours; you may want to bring reading material for the waiting time. 8.Please report to [facility] for your test  What to expect after you arrive:  Once you arrive and check in for your appointment an IV will be started in your arm.  Then the Technoligist will inject a small amount of radioactive tracer.  There will be a 1 hour waiting period after this injection.  A series of pictures will be taken of your heart following this waiting period.  You will be prepped for the stress portion of the test.  During the stress portion of your test you will either walk on a treadmill or receive a small, safe amount of radioactive tracer injected in your IV.  After the stress portion, there is a short rest period during which time your heart and blood pressure will be monitored.  After the short rest period the Technologist will begin your second set of pictures.  Your doctor will inform you of your test results within 7-10 business days.  In preparation for your appointment, medication and supplies will be purchased.  Appointment availability is limited, so if you need to cancel or reschedule please call the office at [phone] 24 hours in advance to avoid a cancellation fee of $100.00

## 2024-03-04 ENCOUNTER — Telehealth (HOSPITAL_COMMUNITY): Payer: Self-pay | Admitting: *Deleted

## 2024-03-04 NOTE — Telephone Encounter (Signed)
 Left message on voicemail per DPR in reference to upcoming appointment scheduled on 03/09/2024 at 7:30 with detailed instructions given per Myocardial Perfusion Study Information Sheet for the test. LM to arrive 15 minutes early, and that it is imperative to arrive on time for appointment to keep from having the test rescheduled. If you need to cancel or reschedule your appointment, please call the office within 24 hours of your appointment. Failure to do so may result in a cancellation of your appointment, and a $50 no show fee. Phone number given for call back for any questions.

## 2024-03-05 ENCOUNTER — Other Ambulatory Visit: Payer: Self-pay

## 2024-03-05 ENCOUNTER — Ambulatory Visit: Admitting: Internal Medicine

## 2024-03-05 ENCOUNTER — Encounter: Payer: Self-pay | Admitting: Internal Medicine

## 2024-03-05 ENCOUNTER — Other Ambulatory Visit (HOSPITAL_COMMUNITY): Payer: Self-pay

## 2024-03-05 ENCOUNTER — Encounter: Payer: Self-pay | Admitting: Gastroenterology

## 2024-03-05 VITALS — BP 119/65 | HR 88 | Temp 98.3°F | Ht 72.0 in | Wt 228.2 lb

## 2024-03-05 DIAGNOSIS — E1140 Type 2 diabetes mellitus with diabetic neuropathy, unspecified: Secondary | ICD-10-CM

## 2024-03-05 DIAGNOSIS — Z794 Long term (current) use of insulin: Secondary | ICD-10-CM | POA: Diagnosis not present

## 2024-03-05 DIAGNOSIS — E1169 Type 2 diabetes mellitus with other specified complication: Secondary | ICD-10-CM | POA: Diagnosis not present

## 2024-03-05 DIAGNOSIS — F3342 Major depressive disorder, recurrent, in full remission: Secondary | ICD-10-CM

## 2024-03-05 DIAGNOSIS — E1151 Type 2 diabetes mellitus with diabetic peripheral angiopathy without gangrene: Secondary | ICD-10-CM

## 2024-03-05 DIAGNOSIS — D509 Iron deficiency anemia, unspecified: Secondary | ICD-10-CM

## 2024-03-05 DIAGNOSIS — Z7985 Long-term (current) use of injectable non-insulin antidiabetic drugs: Secondary | ICD-10-CM | POA: Diagnosis not present

## 2024-03-05 DIAGNOSIS — I10 Essential (primary) hypertension: Secondary | ICD-10-CM | POA: Diagnosis not present

## 2024-03-05 DIAGNOSIS — I350 Nonrheumatic aortic (valve) stenosis: Secondary | ICD-10-CM | POA: Diagnosis not present

## 2024-03-05 DIAGNOSIS — I2510 Atherosclerotic heart disease of native coronary artery without angina pectoris: Secondary | ICD-10-CM

## 2024-03-05 DIAGNOSIS — N521 Erectile dysfunction due to diseases classified elsewhere: Secondary | ICD-10-CM

## 2024-03-05 DIAGNOSIS — I739 Peripheral vascular disease, unspecified: Secondary | ICD-10-CM | POA: Diagnosis not present

## 2024-03-05 DIAGNOSIS — L309 Dermatitis, unspecified: Secondary | ICD-10-CM

## 2024-03-05 DIAGNOSIS — J4489 Other specified chronic obstructive pulmonary disease: Secondary | ICD-10-CM | POA: Diagnosis not present

## 2024-03-05 MED ORDER — TADALAFIL 10 MG PO TABS: 10.0000 mg | ORAL_TABLET | ORAL | 1 refills | Status: DC | PRN

## 2024-03-05 MED ORDER — ALBUTEROL SULFATE HFA 108 (90 BASE) MCG/ACT IN AERS
1.0000 | INHALATION_SPRAY | RESPIRATORY_TRACT | 5 refills | Status: DC | PRN
  Filled 2024-03-05: qty 8.5, 17d supply, fill #0
  Filled 2024-04-02: qty 8.5, 17d supply, fill #1
  Filled 2024-04-04 – 2024-04-27 (×3): qty 8.5, 17d supply, fill #0
  Filled 2024-05-07: qty 8.5, 17d supply, fill #1
  Filled 2024-05-12 (×2): qty 8.5, 17d supply, fill #0
  Filled 2024-05-12: qty 6.7, 17d supply, fill #0
  Filled 2024-05-25: qty 8.5, 17d supply, fill #1

## 2024-03-05 MED ORDER — TRULICITY 4.5 MG/0.5ML ~~LOC~~ SOAJ
4.5000 mg | SUBCUTANEOUS | 3 refills | Status: DC
  Filled 2024-03-05: qty 2, 28d supply, fill #0
  Filled 2024-04-02: qty 2, 28d supply, fill #1
  Filled 2024-04-04 – 2024-04-23 (×2): qty 2, 28d supply, fill #0
  Filled 2024-05-07 – 2024-05-21 (×2): qty 2, 28d supply, fill #1

## 2024-03-05 MED ORDER — FREESTYLE LIBRE 3 SENSOR MISC
3 refills | Status: DC
  Filled 2024-03-05 – 2024-04-02 (×2): qty 2, 28d supply, fill #0
  Filled 2024-04-02 – 2024-04-24 (×4): qty 2, 28d supply, fill #1
  Filled 2024-05-07 – 2024-05-22 (×2): qty 2, 28d supply, fill #2
  Filled 2024-05-25 (×2): qty 2, 28d supply, fill #0

## 2024-03-05 MED ORDER — MONTELUKAST SODIUM 10 MG PO TABS
10.0000 mg | ORAL_TABLET | Freq: Every day | ORAL | 3 refills | Status: AC
  Filled 2024-03-05 – 2024-05-25 (×7): qty 90, 90d supply, fill #0
  Filled 2024-08-10: qty 90, 90d supply, fill #1

## 2024-03-05 MED ORDER — PREGABALIN 100 MG PO CAPS
100.0000 mg | ORAL_CAPSULE | Freq: Three times a day (TID) | ORAL | 5 refills | Status: DC
  Filled 2024-03-05 – 2024-04-02 (×2): qty 90, 30d supply, fill #0
  Filled 2024-04-04 – 2024-08-10 (×3): qty 90, 30d supply, fill #1

## 2024-03-05 MED ORDER — PEN NEEDLES 32G X 4 MM MISC
1.0000 | Freq: Every day | 3 refills | Status: AC
  Filled 2024-03-05: qty 100, 90d supply, fill #0
  Filled 2024-04-03: qty 100, 100d supply, fill #0
  Filled 2024-04-04 – 2024-07-06 (×3): qty 100, 100d supply, fill #1
  Filled 2024-09-24: qty 100, 100d supply, fill #0

## 2024-03-05 MED ORDER — ESCITALOPRAM OXALATE 5 MG PO TABS
5.0000 mg | ORAL_TABLET | Freq: Every day | ORAL | 3 refills | Status: DC
  Filled 2024-03-05 – 2024-05-25 (×7): qty 90, 90d supply, fill #0
  Filled 2024-08-10: qty 90, 90d supply, fill #1

## 2024-03-05 MED ORDER — FLUTICASONE PROPIONATE 50 MCG/ACT NA SUSP
1.0000 | NASAL | 11 refills | Status: AC | PRN
  Filled 2024-03-05 – 2024-06-26 (×4): qty 16, 60d supply, fill #0

## 2024-03-05 MED ORDER — IPRATROPIUM-ALBUTEROL 0.5-2.5 (3) MG/3ML IN SOLN
3.0000 mL | Freq: Four times a day (QID) | RESPIRATORY_TRACT | 5 refills | Status: DC | PRN
  Filled 2024-03-05 – 2024-04-04 (×2): qty 360, 30d supply, fill #0
  Filled 2024-07-16: qty 360, 30d supply, fill #1

## 2024-03-05 MED ORDER — ATORVASTATIN CALCIUM 40 MG PO TABS
40.0000 mg | ORAL_TABLET | Freq: Every day | ORAL | 3 refills | Status: DC
  Filled 2024-03-05: qty 90, 90d supply, fill #0

## 2024-03-05 MED ORDER — INSULIN DEGLUDEC 100 UNIT/ML ~~LOC~~ SOPN
20.0000 [IU] | PEN_INJECTOR | Freq: Every day | SUBCUTANEOUS | 4 refills | Status: DC
  Filled 2024-03-05 – 2024-04-03 (×2): qty 15, 75d supply, fill #0
  Filled 2024-05-07 – 2024-06-15 (×2): qty 15, 75d supply, fill #1
  Filled 2024-06-26: qty 15, 75d supply, fill #0
  Filled 2024-09-24: qty 15, 75d supply, fill #1

## 2024-03-05 MED ORDER — TADALAFIL 10 MG PO TABS
10.0000 mg | ORAL_TABLET | ORAL | 1 refills | Status: DC | PRN
  Filled 2024-03-05 – 2024-03-13 (×2): qty 20, 30d supply, fill #0

## 2024-03-05 MED ORDER — CLOPIDOGREL BISULFATE 75 MG PO TABS
75.0000 mg | ORAL_TABLET | Freq: Every day | ORAL | 3 refills | Status: DC
Start: 2024-03-05 — End: 2024-08-24
  Filled 2024-03-05 – 2024-05-25 (×7): qty 90, 90d supply, fill #0
  Filled 2024-08-10: qty 90, 90d supply, fill #1

## 2024-03-05 NOTE — Assessment & Plan Note (Signed)
 Viagra  has been ineffective.  Discussed we referred him to Urology, Dr Parke Boll back in 2019.  Recommended Trimix injections or penile prostesis.  Richard [name] does not like the idea of either.  Discussed we can try cialis  but not likely to be effective given no response to viagra .  He will try this and if ineffective will go back to urology to rediscuss/teach trimix versus prostesis.

## 2024-03-05 NOTE — Progress Notes (Signed)
 Established Patient Office Visit  Subjective   Patient ID: Richard [name], male    DOB: September 02, 1961  Age: 63 y.o. MRN: [id number]  Chief Complaint  Patient presents with   Clearance for knee surgery   Follow-up    Talk about cholesterol level and neuro referral. Low iron .    Overall feeling well, has been seeing multiple specialists to get clearance for knee surgery.   At his last visit he was noted to have iron  deficency anemia, he has been taking oral iron  supplementation.  He has not yet had a call to schedule appointment with [facility] gastroenterology.  Saw Cardiology, Marlana Silvan 2 days ago for cardiac clearance, appears to have plan for Cardiac imaging with Lexiscan  myoview  for ischemic eval. Scheudled for 03/09/24. Recently saw Dr Waylan Haggard on 02/20/24.  Recommended to hold off Knee replacement surgery until asthma under better control. Saw Dermatology in April and started on dupexient for atopic dermitits.  Hopefully this will also help asthma control. Saw Dr Darlin Ehrlich, ENT on 01/01/24, was reassured that enlargement on right vallecula on CT was without suspicious lesion or mass and recommended follow up in 6 months. Has upcoming appointment with [name] for sleep study on 03/30/24. He is following with Podiatry   Taking his diabetes medications as prescribed.  Not had high sugars even with the steroids for Asthma/COPD.  Still using 20u tresiba  daily, trulicity  4.5 weekly and max dose synjardy . Downloaded his CGM: Cruz Door wore the CGM for 14 days. The average reading was 125, % time in target was 95, % time below target was 0, and % time above target was. 5%. GMI is 6.3%    Objective:     BP 119/65 (BP Location: Left Arm, Patient Position: Sitting, Cuff Size: Large)   Pulse 88   Temp 98.3 F (36.8 C) (Oral)   Ht 6' (1.829 m)   Wt 228 lb 3.2 oz (103.5 kg)   SpO2 100% Comment: RA  BMI 30.95 kg/m  BP Readings from Last 3 Encounters:  03/05/24 119/65  03/03/24 108/66  02/20/24  115/70   Wt Readings from Last 3 Encounters:  03/05/24 228 lb 3.2 oz (103.5 kg)  03/03/24 230 lb 9.6 oz (104.6 kg)  02/20/24 220 lb 12.8 oz (100.2 kg)      Physical Exam Constitutional:      Appearance: Normal appearance.  Cardiovascular:     Rate and Rhythm: Normal rate and regular rhythm.  Pulmonary:     Effort: Pulmonary effort is normal.     Breath sounds: Normal breath sounds.  Neurological:     Mental Status: He is alert.  Psychiatric:        Mood and Affect: Mood normal.        Behavior: Behavior normal.      No results found for any visits on 03/05/24.  Last CBC Lab Results  Component Value Date   WBC 4.9 02/14/2024   HGB 11.0 (L) 02/14/2024   HCT 36.3 (L) 02/14/2024   MCV 76 (L) 02/14/2024   MCH 23.0 (L) 02/14/2024   RDW 15.0 02/14/2024   PLT 335 02/14/2024   Last metabolic panel Lab Results  Component Value Date   GLUCOSE 89 02/14/2024   NA 136 02/14/2024   K 5.3 (H) 02/14/2024   CL 101 02/14/2024   CO2 23 02/14/2024   BUN 17 02/14/2024   CREATININE 0.99 02/14/2024   EGFR 85 02/14/2024   CALCIUM  9.3 02/14/2024   PHOS 4.9 (H) 04/08/2023  PROT 6.9 11/29/2023   ALBUMIN 3.4 (L) 11/29/2023   LABGLOB 2.5 08/17/2021   AGRATIO 1.7 08/17/2021   BILITOT 0.5 11/29/2023   ALKPHOS 105 11/29/2023   AST 22 11/29/2023   ALT 26 11/29/2023   ANIONGAP 10 12/03/2023   Last hemoglobin A1c Lab Results  Component Value Date   HGBA1C 7.6 (A) 02/14/2024      The 10-year ASCVD risk score (Arnett DK, et al., 2019) is: 12.9%    Assessment & Plan:   Problem List Items Addressed This Visit       Cardiovascular and Mediastinum   Type 2 diabetes mellitus with peripheral artery disease (HCC) (Chronic)   A1c in early May was 7.6% CGM shows he is doing even better now.  Will continue course no changes.      Relevant Medications   tadalafil (CIALIS) 10 MG tablet   Dulaglutide  (TRULICITY ) 4.5 MG/0.5ML SOAJ   insulin  degludec (TRESIBA ) 100 UNIT/ML FlexTouch  Pen   Continuous Glucose Sensor (FREESTYLE LIBRE 3 SENSOR) MISC   atorvastatin  (LIPITOR) 40 MG tablet   Other Relevant Orders   BMP8+Anion Gap   Peripheral arterial disease (HCC) (Chronic)   Atorvastatin  40mg  and ASA 81mg , recheck lipid panel      Relevant Medications   tadalafil (CIALIS) 10 MG tablet   clopidogrel  (PLAVIX ) 75 MG tablet   atorvastatin  (LIPITOR) 40 MG tablet   Moderate aortic stenosis (Chronic)   No syncope or CHF.  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      Relevant Medications   ipratropium-albuterol  (DUONEB) 0.5-2.5 (3) MG/3ML SOLN   albuterol  (VENTOLIN  HFA) 108 (90 Base) MCG/ACT inhaler   fluticasone  (FLONASE ) 50 MCG/ACT nasal spray   montelukast  (SINGULAIR ) 10 MG tablet     Endocrine   Erectile dysfunction associated with type 2 diabetes mellitus (HCC) (Chronic)   Viagra  has been ineffective.  Discussed we referred him to Urology, Dr Parke Boll back in 2019.  Recommended Trimix injections or penile prostesis.  Tyre does not like the idea of either.  Discussed we can try cialis but not likely to be effective given no response to viagra .  He will try this and if ineffective will go back to urology to rediscuss/teach trimix versus prostesis.      Relevant Medications   Dulaglutide  (TRULICITY ) 4.5 MG/0.5ML SOAJ   insulin  degludec (TRESIBA ) 100 UNIT/ML FlexTouch Pen   atorvastatin  (LIPITOR) 40 MG tablet    Diabetic neuropathy, painful (HCC) (Chronic)   Continue lyrica       Relevant Medications   pregabalin  (LYRICA ) 100 MG capsule   Dulaglutide  (TRULICITY ) 4.5 MG/0.5ML SOAJ   insulin  degludec (TRESIBA ) 100 UNIT/ML FlexTouch Pen   atorvastatin  (LIPITOR) 40 MG tablet     Musculoskeletal and Integument   Eczema (Chronic)   Now on dupixent.        Other   MDD (major depressive disorder), recurrent, in  full remission (HCC)   Mood much improved. Continue lexapro  5mg  daily      Relevant Medications   escitalopram  (LEXAPRO ) 5 MG tablet   Iron  deficiency anemia   Has needed iron  infusions in the past, will recheck iron  levels today.  If not improveing will send for iron  infusion.  Has been referred to GI for diagnostic colonoscopy.      Relevant Orders   CBC no Diff   Iron , TIBC and Ferritin Panel    Return in about 2 months (around 05/05/2024) for DM, HTN.    Priscella Brooms, DO

## 2024-03-05 NOTE — Assessment & Plan Note (Signed)
 Followed by Pulmonology, awaiting reevaluation for clearance.

## 2024-03-05 NOTE — Assessment & Plan Note (Signed)
 A1c in early May was 7.6% CGM shows he is doing even better now.  Will continue course no changes.

## 2024-03-05 NOTE — Assessment & Plan Note (Signed)
 No syncope or CHF.  Following with cardiology

## 2024-03-05 NOTE — Assessment & Plan Note (Signed)
 Continue lyrica

## 2024-03-05 NOTE — Progress Notes (Signed)
 Patient presents today to pick up diabetic shoes and insoles.  Patient was dispensed 1 pair of diabetic shoes and 3 ea of casted diabetic insoles for Right shoe and [place] for Left shoe Fit was satisfactory. Instructions for break-in and wear was reviewed and a copy was given to the patient.   Re-appointment for regularly scheduled diabetic foot care visits or if they should experience any trouble with the shoes or insoles.

## 2024-03-05 NOTE — Assessment & Plan Note (Signed)
 Atorvastatin  40mg  and ASA 81mg , recheck lipid panel

## 2024-03-05 NOTE — Assessment & Plan Note (Signed)
 Now on dupixent.

## 2024-03-05 NOTE — Assessment & Plan Note (Signed)
 Mood much improved. Continue lexapro  5mg  daily

## 2024-03-05 NOTE — Assessment & Plan Note (Signed)
Recheck Lipid panel today.  

## 2024-03-05 NOTE — Assessment & Plan Note (Signed)
 Has needed iron  infusions in the past, will recheck iron  levels today.  If not improveing will send for iron  infusion.  Has been referred to GI for diagnostic colonoscopy.

## 2024-03-06 ENCOUNTER — Ambulatory Visit: Payer: Self-pay | Admitting: Internal Medicine

## 2024-03-06 ENCOUNTER — Other Ambulatory Visit: Payer: Self-pay

## 2024-03-06 LAB — IRON,TIBC AND FERRITIN PANEL
Ferritin: 9 ng/mL — ABNORMAL LOW (ref 30–400)
Iron Saturation: 7 % — CL (ref 15–55)
Iron: 33 ug/dL — ABNORMAL LOW (ref 38–169)
Total Iron Binding Capacity: 457 ug/dL — ABNORMAL HIGH (ref 250–450)
UIBC: 424 ug/dL — ABNORMAL HIGH (ref 111–343)

## 2024-03-06 LAB — BMP8+ANION GAP
Anion Gap: 14 mmol/L (ref 10.0–18.0)
BUN/Creatinine Ratio: 18 (ref 10–24)
BUN: 19 mg/dL (ref 8–27)
CO2: 21 mmol/L (ref 20–29)
Calcium: 9.2 mg/dL (ref 8.6–10.2)
Chloride: 104 mmol/L (ref 96–106)
Creatinine, Ser: 1.06 mg/dL (ref 0.76–1.27)
Glucose: 100 mg/dL — ABNORMAL HIGH (ref 70–99)
Potassium: 4.9 mmol/L (ref 3.5–5.2)
Sodium: 139 mmol/L (ref 134–144)
eGFR: 78 mL/min/{1.73_m2} (ref >59)

## 2024-03-06 LAB — LIPID PANEL
Chol/HDL Ratio: 3.1 ratio (ref 0.0–5.0)
Cholesterol, Total: 204 mg/dL — ABNORMAL HIGH (ref 100–199)
HDL: 65 mg/dL (ref >39)
LDL Chol Calc (NIH): 113 mg/dL — ABNORMAL HIGH (ref 0–99)
Triglycerides: 152 mg/dL — ABNORMAL HIGH (ref 0–149)
VLDL Cholesterol Cal: 26 mg/dL (ref 5–40)

## 2024-03-06 LAB — CBC
Hematocrit: 34.2 % — ABNORMAL LOW (ref 37.5–51.0)
Hemoglobin: 10 g/dL — ABNORMAL LOW (ref 13.0–17.7)
MCH: 22.3 pg — ABNORMAL LOW (ref 26.6–33.0)
MCHC: 29.2 g/dL — ABNORMAL LOW (ref 31.5–35.7)
MCV: 76 fL — ABNORMAL LOW (ref 79–97)
Platelets: 374 10*3/uL (ref 150–450)
RBC: 4.49 x10E6/uL (ref 4.14–5.80)
RDW: 15 % (ref 11.6–15.4)
WBC: 5.4 10*3/uL (ref 3.4–10.8)

## 2024-03-06 MED ORDER — ATORVASTATIN CALCIUM 80 MG PO TABS
80.0000 mg | ORAL_TABLET | Freq: Every day | ORAL | 3 refills | Status: DC
  Filled 2024-03-06: qty 90, 90d supply, fill #0

## 2024-03-06 NOTE — Addendum Note (Signed)
 Addended by: Tod Forward C on: 03/06/2024 11:40 AM   Modules accepted: Orders

## 2024-03-09 ENCOUNTER — Ambulatory Visit (HOSPITAL_COMMUNITY)
Admission: RE | Admit: 2024-03-09 | Discharge: 2024-03-09 | Disposition: A | Discharge status: Home / Self Care | Source: Ambulatory Visit | Attending: Cardiovascular Disease | Admitting: Cardiovascular Disease

## 2024-03-09 ENCOUNTER — Telehealth: Payer: Self-pay

## 2024-03-09 ENCOUNTER — Telehealth: Payer: Self-pay | Admitting: Internal Medicine

## 2024-03-09 DIAGNOSIS — R072 Precordial pain: Secondary | ICD-10-CM | POA: Diagnosis not present

## 2024-03-09 DIAGNOSIS — I2510 Atherosclerotic heart disease of native coronary artery without angina pectoris: Secondary | ICD-10-CM | POA: Diagnosis not present

## 2024-03-09 DIAGNOSIS — Z01810 Encounter for preprocedural cardiovascular examination: Secondary | ICD-10-CM | POA: Diagnosis not present

## 2024-03-09 LAB — MYOCARDIAL PERFUSION IMAGING
Base ST Depression (mm): 0 mm
LV dias vol: 142 mL (ref 62–150)
LV sys vol: 60 mL
Nuc Stress EF: 58 %
Peak HR: 109 {beats}/min
Rest HR: 82 {beats}/min
Rest Nuclear Isotope Dose: 10.8 mCi
SDS: 0
SRS: 16
SSS: 3
ST Depression (mm): 0 mm
Stress Nuclear Isotope Dose: 32.8 mCi
TID: 1.02

## 2024-03-09 MED ORDER — TECHNETIUM TC 99M TETROFOSMIN IV KIT
32.8000 | PACK | Freq: Once | INTRAVENOUS | Status: AC | PRN
  Administered 2024-03-09: 32.8 via INTRAVENOUS

## 2024-03-09 MED ORDER — REGADENOSON 0.4 MG/5ML IV SOLN
0.4000 mg | Freq: Once | INTRAVENOUS | Status: AC
  Administered 2024-03-09: 0.4 mg via INTRAVENOUS

## 2024-03-09 MED ORDER — TECHNETIUM TC 99M TETROFOSMIN IV KIT
10.8000 | PACK | Freq: Once | INTRAVENOUS | Status: AC | PRN
  Administered 2024-03-09: 10.8 via INTRAVENOUS

## 2024-03-09 MED ORDER — REGADENOSON 0.4 MG/5ML IV SOLN
INTRAVENOUS | Status: AC
  Filled 2024-03-09: qty 5

## 2024-03-09 NOTE — Telephone Encounter (Signed)
 Patient referred to infusion pharmacy team for ambulatory infusion of IV iron .  Insurance - [employer] Medicare  Dx code - D50.9  IV Iron  Therapy - Feraheme 510 mg IV x2  Infusion appointments -[facility] Scheduling team will schedule patient as soon as possible.   Mehek Grega D. Leandra Vanderweele, PharmD

## 2024-03-09 NOTE — Telephone Encounter (Signed)
 Auth Submission: NO AUTH NEEDED Site of care: Site of care: [facility] Payer: [employer] dual complete medicare Medication & CPT/J Code(s) submitted: Feraheme (ferumoxytol) [id number] Route of submission (phone, fax, portal): portal Phone # Fax # Auth type: Buy/Bill PB Units/visits requested: 510mg  x 2 doses Reference number: [id number] Approval from: 03/09/24 to 07/09/24

## 2024-03-10 ENCOUNTER — Ambulatory Visit: Payer: Self-pay | Admitting: Nurse Practitioner

## 2024-03-11 ENCOUNTER — Ambulatory Visit: Admitting: Podiatry

## 2024-03-13 ENCOUNTER — Other Ambulatory Visit: Payer: Self-pay

## 2024-03-13 ENCOUNTER — Encounter: Payer: Self-pay | Admitting: Pulmonary Disease

## 2024-03-16 ENCOUNTER — Other Ambulatory Visit: Payer: Self-pay

## 2024-03-16 ENCOUNTER — Other Ambulatory Visit

## 2024-03-20 ENCOUNTER — Ambulatory Visit (INDEPENDENT_AMBULATORY_CARE_PROVIDER_SITE_OTHER)

## 2024-03-20 VITALS — BP 132/75 | HR 94 | Temp 98.2°F | Resp 18 | Ht 75.0 in | Wt 226.6 lb

## 2024-03-20 DIAGNOSIS — D509 Iron deficiency anemia, unspecified: Secondary | ICD-10-CM

## 2024-03-20 MED ORDER — SODIUM CHLORIDE 0.9 % IV SOLN
510.0000 mg | Freq: Once | INTRAVENOUS | Status: AC
  Administered 2024-03-20: 510 mg via INTRAVENOUS
  Filled 2024-03-20: qty 17

## 2024-03-20 NOTE — Progress Notes (Signed)
 Diagnosis: Iron  Deficiency Anemia  Provider:  Praveen Mannam MD  Procedure: IV Infusion  IV Type: Peripheral, IV Location: L Antecubital  Feraheme (Ferumoxytol), Dose: 510 mg  Infusion Start Time: 1014  Infusion Stop Time: 1033  Post Infusion IV Care: Observation period completed and Peripheral IV Discontinued  Discharge: Condition: Good, Destination: Home . AVS Provided  Performed by:  Cynda Soule, RN

## 2024-03-20 NOTE — Patient Instructions (Signed)

## 2024-03-26 ENCOUNTER — Encounter: Admitting: Internal Medicine

## 2024-03-27 ENCOUNTER — Ambulatory Visit

## 2024-03-27 VITALS — BP 151/82 | HR 84 | Temp 97.9°F | Resp 20 | Ht 75.0 in | Wt 224.2 lb

## 2024-03-27 DIAGNOSIS — D509 Iron deficiency anemia, unspecified: Secondary | ICD-10-CM | POA: Diagnosis not present

## 2024-03-27 MED ORDER — SODIUM CHLORIDE 0.9 % IV SOLN
510.0000 mg | Freq: Once | INTRAVENOUS | Status: AC
  Administered 2024-03-27: 510 mg via INTRAVENOUS
  Filled 2024-03-27: qty 17

## 2024-03-27 NOTE — Progress Notes (Signed)
 Diagnosis: Iron  Deficiency Anemia  Provider:  Praveen Mannam MD  Procedure: IV Infusion  IV Type: Peripheral, IV Location: L Antecubital  Feraheme (Ferumoxytol), Dose: 510 mg  Infusion Start Time: 0829  Infusion Stop Time: 0848  Post Infusion IV Care: Observation period completed and Peripheral IV Discontinued  Discharge: Condition: Good, Destination: Home . AVS Declined  Performed by:  Lauran Pollard, LPN

## 2024-03-29 ENCOUNTER — Encounter (HOSPITAL_COMMUNITY): Payer: Self-pay | Admitting: Emergency Medicine

## 2024-03-29 ENCOUNTER — Emergency Department (HOSPITAL_COMMUNITY)

## 2024-03-29 ENCOUNTER — Inpatient Hospital Stay (HOSPITAL_COMMUNITY)
Admission: EM | Admit: 2024-03-29 | Discharge: 2024-04-01 | DRG: 202 | Disposition: A | Discharge status: Home / Self Care | Attending: Infectious Diseases | Admitting: Infectious Diseases

## 2024-03-29 ENCOUNTER — Other Ambulatory Visit: Payer: Self-pay

## 2024-03-29 DIAGNOSIS — E1151 Type 2 diabetes mellitus with diabetic peripheral angiopathy without gangrene: Secondary | ICD-10-CM | POA: Diagnosis present

## 2024-03-29 DIAGNOSIS — R0603 Acute respiratory distress: Secondary | ICD-10-CM | POA: Diagnosis present

## 2024-03-29 DIAGNOSIS — R0902 Hypoxemia: Secondary | ICD-10-CM | POA: Diagnosis not present

## 2024-03-29 DIAGNOSIS — R000 Tachycardia, unspecified: Secondary | ICD-10-CM | POA: Diagnosis not present

## 2024-03-29 DIAGNOSIS — Z7982 Long term (current) use of aspirin: Secondary | ICD-10-CM | POA: Diagnosis not present

## 2024-03-29 DIAGNOSIS — Z87891 Personal history of nicotine dependence: Secondary | ICD-10-CM | POA: Diagnosis not present

## 2024-03-29 DIAGNOSIS — Z89422 Acquired absence of other left toe(s): Secondary | ICD-10-CM | POA: Diagnosis not present

## 2024-03-29 DIAGNOSIS — Z794 Long term (current) use of insulin: Secondary | ICD-10-CM

## 2024-03-29 DIAGNOSIS — J45901 Unspecified asthma with (acute) exacerbation: Secondary | ICD-10-CM | POA: Diagnosis not present

## 2024-03-29 DIAGNOSIS — E11621 Type 2 diabetes mellitus with foot ulcer: Secondary | ICD-10-CM | POA: Diagnosis not present

## 2024-03-29 DIAGNOSIS — L97419 Non-pressure chronic ulcer of right heel and midfoot with unspecified severity: Secondary | ICD-10-CM | POA: Diagnosis not present

## 2024-03-29 DIAGNOSIS — Z7722 Contact with and (suspected) exposure to environmental tobacco smoke (acute) (chronic): Secondary | ICD-10-CM | POA: Diagnosis present

## 2024-03-29 DIAGNOSIS — R0602 Shortness of breath: Secondary | ICD-10-CM | POA: Diagnosis not present

## 2024-03-29 DIAGNOSIS — T380X5A Adverse effect of glucocorticoids and synthetic analogues, initial encounter: Secondary | ICD-10-CM | POA: Diagnosis not present

## 2024-03-29 DIAGNOSIS — Z7902 Long term (current) use of antithrombotics/antiplatelets: Secondary | ICD-10-CM

## 2024-03-29 DIAGNOSIS — D509 Iron deficiency anemia, unspecified: Secondary | ICD-10-CM | POA: Diagnosis not present

## 2024-03-29 DIAGNOSIS — Z7951 Long term (current) use of inhaled steroids: Secondary | ICD-10-CM | POA: Diagnosis not present

## 2024-03-29 DIAGNOSIS — I10 Essential (primary) hypertension: Secondary | ICD-10-CM | POA: Diagnosis not present

## 2024-03-29 DIAGNOSIS — Z79899 Other long term (current) drug therapy: Secondary | ICD-10-CM

## 2024-03-29 DIAGNOSIS — E1165 Type 2 diabetes mellitus with hyperglycemia: Secondary | ICD-10-CM | POA: Diagnosis present

## 2024-03-29 DIAGNOSIS — J441 Chronic obstructive pulmonary disease with (acute) exacerbation: Secondary | ICD-10-CM | POA: Diagnosis not present

## 2024-03-29 DIAGNOSIS — L84 Corns and callosities: Secondary | ICD-10-CM | POA: Diagnosis not present

## 2024-03-29 DIAGNOSIS — R0789 Other chest pain: Secondary | ICD-10-CM | POA: Diagnosis not present

## 2024-03-29 DIAGNOSIS — Z1152 Encounter for screening for COVID-19: Secondary | ICD-10-CM

## 2024-03-29 DIAGNOSIS — I2510 Atherosclerotic heart disease of native coronary artery without angina pectoris: Secondary | ICD-10-CM | POA: Diagnosis present

## 2024-03-29 DIAGNOSIS — G4733 Obstructive sleep apnea (adult) (pediatric): Secondary | ICD-10-CM | POA: Diagnosis not present

## 2024-03-29 DIAGNOSIS — E1140 Type 2 diabetes mellitus with diabetic neuropathy, unspecified: Secondary | ICD-10-CM | POA: Diagnosis present

## 2024-03-29 DIAGNOSIS — E785 Hyperlipidemia, unspecified: Secondary | ICD-10-CM | POA: Diagnosis not present

## 2024-03-29 LAB — I-STAT VENOUS BLOOD GAS, ED
Acid-Base Excess: 1 mmol/L (ref 0.0–2.0)
Bicarbonate: 25 mmol/L (ref 20.0–28.0)
Calcium, Ion: 1.16 mmol/L (ref 1.15–1.40)
HCT: 37 % — ABNORMAL LOW (ref 39.0–52.0)
Hemoglobin: 12.6 g/dL — ABNORMAL LOW (ref 13.0–17.0)
O2 Saturation: 93 %
Potassium: 4.5 mmol/L (ref 3.5–5.1)
Sodium: 135 mmol/L (ref 135–145)
TCO2: 26 mmol/L (ref 22–32)
pCO2, Ven: 38.7 mmHg — ABNORMAL LOW (ref 44–60)
pH, Ven: 7.418 (ref 7.25–7.43)
pO2, Ven: 67 mmHg — ABNORMAL HIGH (ref 32–45)

## 2024-03-29 LAB — CBC WITH DIFFERENTIAL/PLATELET
Abs Immature Granulocytes: 0.06 10*3/uL (ref 0.00–0.07)
Basophils Absolute: 0.1 10*3/uL (ref 0.0–0.1)
Basophils Relative: 1 %
Eosinophils Absolute: 0.3 10*3/uL (ref 0.0–0.5)
Eosinophils Relative: 5 %
HCT: 36.9 % — ABNORMAL LOW (ref 39.0–52.0)
Hemoglobin: 11.4 g/dL — ABNORMAL LOW (ref 13.0–17.0)
Immature Granulocytes: 1 %
Lymphocytes Relative: 24 %
Lymphs Abs: 1.3 10*3/uL (ref 0.7–4.0)
MCH: 23.4 pg — ABNORMAL LOW (ref 26.0–34.0)
MCHC: 30.9 g/dL (ref 30.0–36.0)
MCV: 75.8 fL — ABNORMAL LOW (ref 80.0–100.0)
Monocytes Absolute: 0.7 10*3/uL (ref 0.1–1.0)
Monocytes Relative: 12 %
Neutro Abs: 3.2 10*3/uL (ref 1.7–7.7)
Neutrophils Relative %: 57 %
Platelets: 260 10*3/uL (ref 150–400)
RBC: 4.87 MIL/uL (ref 4.22–5.81)
RDW: 22.3 % — ABNORMAL HIGH (ref 11.5–15.5)
Smear Review: NORMAL
WBC: 5.6 10*3/uL (ref 4.0–10.5)
nRBC: 0 % (ref 0.0–0.2)

## 2024-03-29 LAB — BASIC METABOLIC PANEL WITH GFR
Anion gap: 13 (ref 5–15)
BUN: 20 mg/dL (ref 8–23)
CO2: 22 mmol/L (ref 22–32)
Calcium: 9.1 mg/dL (ref 8.9–10.3)
Chloride: 102 mmol/L (ref 98–111)
Creatinine, Ser: 1.12 mg/dL (ref 0.61–1.24)
GFR, Estimated: >60 mL/min (ref >60)
Glucose, Bld: 179 mg/dL — ABNORMAL HIGH (ref 70–99)
Potassium: 4.7 mmol/L (ref 3.5–5.1)
Sodium: 137 mmol/L (ref 135–145)

## 2024-03-29 LAB — CBG MONITORING, ED
Glucose-Capillary: 251 mg/dL — ABNORMAL HIGH (ref 70–99)
Glucose-Capillary: 266 mg/dL — ABNORMAL HIGH (ref 70–99)

## 2024-03-29 LAB — RESP PANEL BY RT-PCR (RSV, FLU A&B, COVID)  RVPGX2
Influenza A by PCR: NEGATIVE
Influenza B by PCR: NEGATIVE
Resp Syncytial Virus by PCR: NEGATIVE
SARS Coronavirus 2 by RT PCR: NEGATIVE

## 2024-03-29 LAB — TROPONIN I (HIGH SENSITIVITY)
Troponin I (High Sensitivity): 4 ng/L (ref <18)
Troponin I (High Sensitivity): 4 ng/L (ref <18)

## 2024-03-29 LAB — D-DIMER, QUANTITATIVE: D-Dimer, Quant: <0.27 ug{FEU}/mL (ref 0.00–0.50)

## 2024-03-29 LAB — BRAIN NATRIURETIC PEPTIDE: B Natriuretic Peptide: 47.6 pg/mL (ref 0.0–100.0)

## 2024-03-29 MED ORDER — ENOXAPARIN SODIUM 40 MG/0.4ML IJ SOSY
40.0000 mg | PREFILLED_SYRINGE | INTRAMUSCULAR | Status: DC
  Administered 2024-03-30 – 2024-04-01 (×3): 40 mg via SUBCUTANEOUS
  Filled 2024-03-29 (×3): qty 0.4

## 2024-03-29 MED ORDER — HYDROMORPHONE HCL 1 MG/ML IJ SOLN
0.5000 mg | Freq: Once | INTRAMUSCULAR | Status: AC
  Administered 2024-03-29: 0.5 mg via INTRAVENOUS
  Filled 2024-03-29: qty 1

## 2024-03-29 MED ORDER — INSULIN ASPART 100 UNIT/ML IJ SOLN
0.0000 [IU] | Freq: Every day | INTRAMUSCULAR | Status: DC
  Administered 2024-03-29: 3 [IU] via SUBCUTANEOUS
  Administered 2024-03-30 – 2024-03-31 (×2): 4 [IU] via SUBCUTANEOUS

## 2024-03-29 MED ORDER — PREDNISONE 20 MG PO TABS
40.0000 mg | ORAL_TABLET | Freq: Every day | ORAL | Status: DC
  Administered 2024-03-30 – 2024-04-01 (×3): 40 mg via ORAL
  Filled 2024-03-29 (×3): qty 2

## 2024-03-29 MED ORDER — IPRATROPIUM-ALBUTEROL 0.5-2.5 (3) MG/3ML IN SOLN
3.0000 mL | Freq: Once | RESPIRATORY_TRACT | Status: AC
  Administered 2024-03-29: 3 mL via RESPIRATORY_TRACT
  Filled 2024-03-29: qty 3

## 2024-03-29 MED ORDER — LORAZEPAM 2 MG/ML IJ SOLN
1.0000 mg | Freq: Once | INTRAMUSCULAR | Status: AC
  Administered 2024-03-29: 1 mg via INTRAVENOUS
  Filled 2024-03-29: qty 1

## 2024-03-29 MED ORDER — CLOPIDOGREL BISULFATE 75 MG PO TABS
75.0000 mg | ORAL_TABLET | Freq: Every day | ORAL | Status: DC
  Administered 2024-03-30 – 2024-04-01 (×3): 75 mg via ORAL
  Filled 2024-03-29 (×3): qty 1

## 2024-03-29 MED ORDER — PREGABALIN 100 MG PO CAPS
100.0000 mg | ORAL_CAPSULE | Freq: Three times a day (TID) | ORAL | Status: DC
  Administered 2024-03-29 – 2024-04-01 (×8): 100 mg via ORAL
  Filled 2024-03-29 (×9): qty 1

## 2024-03-29 MED ORDER — ALBUTEROL SULFATE (2.5 MG/3ML) 0.083% IN NEBU
10.0000 mg | INHALATION_SOLUTION | Freq: Once | RESPIRATORY_TRACT | Status: AC
  Administered 2024-03-29: 10 mg via RESPIRATORY_TRACT

## 2024-03-29 MED ORDER — INSULIN ASPART 100 UNIT/ML IJ SOLN
0.0000 [IU] | Freq: Three times a day (TID) | INTRAMUSCULAR | Status: DC
  Administered 2024-03-29: 8 [IU] via SUBCUTANEOUS
  Administered 2024-03-30: 11 [IU] via SUBCUTANEOUS
  Administered 2024-03-30: 2 [IU] via SUBCUTANEOUS
  Administered 2024-03-30: 5 [IU] via SUBCUTANEOUS
  Administered 2024-03-31: 2 [IU] via SUBCUTANEOUS
  Administered 2024-03-31: 3 [IU] via SUBCUTANEOUS
  Administered 2024-04-01: 5 [IU] via SUBCUTANEOUS
  Administered 2024-04-01: 3 [IU] via SUBCUTANEOUS

## 2024-03-29 MED ORDER — INSULIN GLARGINE-YFGN 100 UNIT/ML ~~LOC~~ SOLN
20.0000 [IU] | Freq: Every day | SUBCUTANEOUS | Status: DC
  Administered 2024-03-29 – 2024-03-31 (×3): 20 [IU] via SUBCUTANEOUS
  Filled 2024-03-29 (×4): qty 0.2

## 2024-03-29 MED ORDER — ALBUTEROL SULFATE (2.5 MG/3ML) 0.083% IN NEBU
7.5000 mg | INHALATION_SOLUTION | Freq: Once | RESPIRATORY_TRACT | Status: AC
  Administered 2024-03-29: 7.5 mg via RESPIRATORY_TRACT
  Filled 2024-03-29: qty 9

## 2024-03-29 MED ORDER — METHYLPREDNISOLONE SODIUM SUCC 125 MG IJ SOLR
125.0000 mg | Freq: Once | INTRAMUSCULAR | Status: AC
  Administered 2024-03-29: 125 mg via INTRAVENOUS
  Filled 2024-03-29: qty 2

## 2024-03-29 MED ORDER — ALBUTEROL SULFATE (2.5 MG/3ML) 0.083% IN NEBU
INHALATION_SOLUTION | RESPIRATORY_TRACT | Status: AC
  Filled 2024-03-29: qty 12

## 2024-03-29 MED ORDER — ASPIRIN 81 MG PO TBEC
81.0000 mg | DELAYED_RELEASE_TABLET | Freq: Every day | ORAL | Status: DC
  Administered 2024-03-30 – 2024-04-01 (×3): 81 mg via ORAL
  Filled 2024-03-29 (×4): qty 1

## 2024-03-29 MED ORDER — LIDOCAINE 5 % EX PTCH
1.0000 | MEDICATED_PATCH | CUTANEOUS | Status: DC
  Administered 2024-03-29: 1 via TRANSDERMAL
  Filled 2024-03-29: qty 1

## 2024-03-29 MED ORDER — ESCITALOPRAM OXALATE 10 MG PO TABS
5.0000 mg | ORAL_TABLET | Freq: Every day | ORAL | Status: DC
  Administered 2024-03-30 – 2024-04-01 (×3): 5 mg via ORAL
  Filled 2024-03-29 (×3): qty 1

## 2024-03-29 MED ORDER — RACEPINEPHRINE HCL 2.25 % IN NEBU
0.5000 mL | INHALATION_SOLUTION | RESPIRATORY_TRACT | Status: AC
  Administered 2024-03-29: 0.5 mL via RESPIRATORY_TRACT
  Filled 2024-03-29: qty 0.5

## 2024-03-29 NOTE — ED Provider Notes (Signed)
 [place] EMERGENCY DEPARTMENT AT [facility] Provider Note   CSN: [id number] Arrival date & time: 03/29/24  1113     Patient presents with: Respiratory Distress and Chest Pain   Richard [name] is a 63 y.o. male.   63 year old male with a history of asthma and COPD, PAD on aspirin  and Plavix , nonobstructive CAD, and diabetes who presents emergency department with shortness of breath.  Patient reports that he started having chest pain and shortness of breath last night.  Reports that his symptoms worsened overnight and called 911 this morning.  When EMS arrived they said that he was diaphoretic and very ill-appearing.  Was given 125 Solu-Medrol , 2 g of magnesium , and 2 DuoNebs.  Patient denies any history of intubations for his asthma.       Prior to Admission medications   Medication Sig Start Date End Date Taking? Authorizing Provider  Accu-Chek Softclix Lancets lancets 1 each by Other route See admin instructions. 1-2 times daily 06/27/23   [provider]  albuterol  (PROVENTIL ) (2.5 MG/3ML) 0.083% nebulizer solution Take 3 mLs (2.5 mg total) by nebulization 2 (two) times daily. 12/04/23   Kandis Perkins, DO  albuterol  (VENTOLIN  HFA) 108 (90 Base) MCG/ACT inhaler Inhale 1-2 puffs into the lungs every 4 (four) hours as needed for wheezing or shortness of breath. 03/05/24   Rosan Dayton [name], DO  aspirin  EC 81 MG EC tablet Take 1 tablet (81 mg total) by mouth daily. 09/26/15   Burns, Johana SAUNDERS, MD  atorvastatin  (LIPITOR) 80 MG tablet Take 1 tablet (80 mg total) by mouth daily. 03/06/24   Rosan Dayton [name], DO  Budeson-Glycopyrrol-Formoterol  (BREZTRI  AEROSPHERE) 160-9-4.8 MCG/ACT AERO Inhale 2 puffs into the lungs in the morning and at bedtime. 12/05/22   Gladis Leonor HERO, MD  cetirizine  (ZYRTEC  ALLERGY) 10 MG tablet Take 1 tablet (10 mg total) by mouth daily. 01/20/24 01/19/25  Kandis Perkins, DO  clopidogrel  (PLAVIX ) 75 MG tablet Take 1 tablet (75 mg total) by mouth daily.  03/05/24   Rosan Dayton [name], DO  Continuous Glucose Sensor (FREESTYLE LIBRE 3 SENSOR) MISC Place 1 sensor on the skin every 14 days. Use to check glucose continuously. 03/05/24   Rosan Dayton [name], DO  diphenhydrAMINE  (BENADRYL  ALLERGY) 25 mg capsule Take 1 capsule (25 mg total) by mouth at bedtime as needed for allergies or itching. 01/20/24   Kandis Perkins, DO  Dulaglutide  (TRULICITY ) 4.5 MG/0.5ML SOAJ Inject 4.5 mg as directed once a week. 03/05/24   Rosan Dayton [name], DO  DUPIXENT 300 MG/2ML SOAJ  02/11/24   [provider]  escitalopram  (LEXAPRO ) 5 MG tablet Take 1 tablet (5 mg total) by mouth daily. 03/05/24   Rosan Dayton [name], DO  ferrous sulfate  325 (65 FE) MG tablet Take 1 tablet (325 mg total) by mouth daily. 06/19/23 06/18/24  Tobie Gaines, DO  fluticasone  (FLONASE ) 50 MCG/ACT nasal spray Place 1 spray into both nostrils as needed for allergies. 03/05/24   Rosan Dayton C, DO  glucose blood test strip 1 each by Other route in the morning and at bedtime. 08/05/23   [provider]  insulin  degludec (TRESIBA ) 100 UNIT/ML FlexTouch Pen Inject 20 Units into the skin daily. 03/05/24   Rosan Dayton [name], DO  Insulin  Pen Needle (PEN NEEDLES) 32G X 4 MM MISC Use 1 each daily as directed. 03/05/24   Rosan Dayton [name], DO  ipratropium-albuterol  (DUONEB) 0.5-2.5 (3) MG/3ML SOLN Use 1 vial (3 mLs) by nebulization every 6 (six) hours as needed.  03/05/24   Rosan Dayton [name], DO  Mepolizumab  (NUCALA ) 100 MG/ML SOAJ Inject 1 mL (100 mg total) into the skin every 28 (twenty-eight) days. 08/15/23   Francella Rogue, MD  montelukast  (SINGULAIR ) 10 MG tablet Take 1 tablet (10 mg total) by mouth at bedtime. 03/05/24   Rosan Dayton [name], DO  pregabalin  (LYRICA ) 100 MG capsule Take 1 capsule (100 mg total) by mouth 3 (three) times daily. 03/05/24   Rosan Dayton [name], DO  tadalafil  (CIALIS ) 10 MG tablet Take 1 tablet (10 mg total) by mouth as needed for erectile dysfunction. 03/05/24 03/05/25  Rosan Dayton [name], DO  triamcinolone  cream  (KENALOG ) 0.1 % Apply 1 Application topically 2 (two) times daily. 01/20/24   Kandis Perkins, DO    Allergies: Shellfish allergy    Review of Systems  Updated Vital Signs BP 131/60   Pulse (!) 103   Temp 97.7 F (36.5 C) (Axillary)   Resp 17   Ht 6' 3 (1.905 m)   Wt 101.6 kg   SpO2 100%   BMI 28.00 kg/m   Physical Exam Vitals and nursing note reviewed.  Constitutional:      General: He is in acute distress.     Appearance: He is well-developed. He is ill-appearing.  HENT:     Head: Normocephalic and atraumatic.     Right Ear: External ear normal.     Left Ear: External ear normal.     Nose: Nose normal.     Mouth/Throat:     Mouth: Mucous membranes are moist.     Pharynx: Posterior oropharyngeal erythema present. No oropharyngeal exudate.   Eyes:     Extraocular Movements: Extraocular movements intact.     Conjunctiva/sclera: Conjunctivae normal.     Pupils: Pupils are equal, round, and reactive to light.    Cardiovascular:     Rate and Rhythm: Regular rhythm. Tachycardia present.     Heart sounds: Murmur heard.  Pulmonary:     Effort: Respiratory distress present.     Breath sounds: Wheezing present.     Comments: Duonebs being given  Musculoskeletal:     Cervical back: Normal range of motion and neck supple.     Right lower leg: No edema.     Left lower leg: No edema.   Skin:    General: Skin is warm and dry.   Neurological:     Mental Status: He is alert. Mental status is at baseline.   Psychiatric:        Mood and Affect: Mood normal.        Behavior: Behavior normal.     (all labs ordered are listed, but only abnormal results are displayed) Labs Reviewed  BASIC METABOLIC PANEL WITH GFR - Abnormal; Notable for the following components:      Result Value   Glucose, Bld 179 (*)    All other components within normal limits  CBC WITH DIFFERENTIAL/PLATELET - Abnormal; Notable for the following components:   Hemoglobin 11.4 (*)    HCT 36.9 (*)     MCV 75.8 (*)    MCH 23.4 (*)    RDW 22.3 (*)    All other components within normal limits  I-STAT VENOUS BLOOD GAS, ED - Abnormal; Notable for the following components:   pCO2, Ven 38.7 (*)    pO2, Ven 67 (*)    HCT 37.0 (*)    Hemoglobin 12.6 (*)    All other components within normal limits  RESP PANEL BY RT-PCR (RSV,  FLU A&B, COVID)  [id number]  BRAIN NATRIURETIC PEPTIDE  D-DIMER, QUANTITATIVE  TROPONIN I (HIGH SENSITIVITY)  TROPONIN I (HIGH SENSITIVITY)    EKG: EKG Interpretation Date/Time:  Sunday March 29 2024 12:52:47 EDT Ventricular Rate:  107 PR Interval:  161 QRS Duration:  87 QT Interval:  346 QTC Calculation: 462 R Axis:   55  Text Interpretation: Sinus tachycardia Probable left atrial enlargement RSR' in V1 or V2, probably normal variant Nonspecific ST abnormality Confirmed by Yolande Charleston [phone]) on 03/29/2024 1:44:07 PM  Radiology: [facility] Chest Port 1 View Result Date: 03/29/2024 CLINICAL DATA:  Short of breath EXAM: PORTABLE CHEST 1 VIEW COMPARISON:  None Available. FINDINGS: Normal mediastinum and cardiac silhouette. Normal pulmonary vasculature. No evidence of effusion, infiltrate, or pneumothorax. No acute bony abnormality. IMPRESSION: No acute cardiopulmonary process. Electronically Signed   By: Jackquline Boxer M.D.   On: 03/29/2024 12:00     Procedures   Medications Ordered in the ED  albuterol  (PROVENTIL ) (2.5 MG/3ML) 0.083% nebulizer solution (  Not Given 03/29/24 1213)  enoxaparin  (LOVENOX ) injection 40 mg (has no administration in time range)  lidocaine  (LIDODERM ) 5 % 1 patch (1 patch Transdermal Patch Applied 03/29/24 1445)  aspirin  EC tablet 81 mg (has no administration in time range)  clopidogrel  (PLAVIX ) tablet 75 mg (has no administration in time range)  pregabalin  (LYRICA ) capsule 100 mg (has no administration in time range)  escitalopram  (LEXAPRO ) tablet 5 mg (has no administration in time range)  predniSONE  (DELTASONE ) tablet 40 mg (has no  administration in time range)  methylPREDNISolone  sodium succinate (SOLU-MEDROL ) 125 mg/2 mL injection 125 mg (125 mg Intravenous Given 03/29/24 1122)  Racepinephrine HCl 2.25 % nebulizer solution 0.5 mL (0.5 mLs Nebulization Given 03/29/24 1140)  albuterol  (PROVENTIL ) (2.5 MG/3ML) 0.083% nebulizer solution 10 mg (10 mg Nebulization Given 03/29/24 1140)  LORazepam  (ATIVAN ) injection 1 mg (1 mg Intravenous Given 03/29/24 1201)  HYDROmorphone  (DILAUDID ) injection 0.5 mg (0.5 mg Intravenous Given 03/29/24 1300)  albuterol  (PROVENTIL ) (2.5 MG/3ML) 0.083% nebulizer solution 7.5 mg (7.5 mg Nebulization Given 03/29/24 1342)  ipratropium-albuterol  (DUONEB) 0.5-2.5 (3) MG/3ML nebulizer solution 3 mL (3 mLs Nebulization Given 03/29/24 1413)    Clinical Course as of 03/29/24 1509  Sun Mar 29, 2024  1315 Dw Dr [place] [RP]    Clinical Course User Index [RP] Yolande Charleston [name], MD                                 Medical Decision Making Amount and/or Complexity of Data Reviewed Labs: ordered. Radiology: ordered.  Risk OTC drugs. Prescription drug management. Decision regarding hospitalization.   63 year old male with a history of asthma and COPD, PAD on aspirin  and Plavix , nonobstructive CAD, and diabetes who presents emergency department with shortness of breath.  Initial Ddx:  COPD exacerbation, CHF, asthma, pneumonia, URI, upper airway swelling  MDM/Course:  Patient presents emergency department with wheezing and respiratory distress.  On arrival did appear to have diffuse wheezing and increased work of breathing and was placed on BiPAP.  Was given continuous albuterol  as well as Ativan  to help tolerate the BiPAP.  COVID and flu negative.  Chest x-ray without infiltrate.  With his chest pain did have serial troponins and EKG that were reassuring.  D-dimer also undetectably low.  Upon re-evaluation was feeling better and actually was able to take off the BiPAP.  Discussed with teaching service for  admission.  This patient presents to  the ED for concern of complaints listed in HPI, this involves an extensive number of treatment options, and is a complaint that carries with it a high risk of complications and morbidity. Disposition including potential need for admission considered.   Dispo: Admit  Additional history obtained from spouse Records reviewed Outpatient Clinic Notes The following labs were independently interpreted: Chemistry and show no acute abnormality I independently reviewed the following imaging with scope of interpretation limited to determining acute life threatening conditions related to emergency care: Chest x-ray and agree with the radiologist interpretation with the following exceptions: none I personally reviewed and interpreted cardiac monitoring: normal sinus rhythm  I personally reviewed and interpreted the pt's EKG: see above for interpretation  I have reviewed the patients home medications and made adjustments as needed Consults: teaching service  Portions of this note were generated with Dragon dictation software. Dictation errors may occur despite best attempts at proofreading.     Final diagnoses:  COPD exacerbation [place])  Respiratory distress    ED Discharge Orders     None          Yolande Lamar [name], MD 03/29/24 [phone]

## 2024-03-29 NOTE — ED Triage Notes (Signed)
 Per [facility] pt c/o respiratory distress and shortness of breath. Hx of copd. Patient was diaphoretic on arrival and report wheezing throughout. Given 2 duonebs, 125 solu medrol  and 2 grams magnesium . EDP at bedside and ordering bipap. Patient also c/o chest pain.

## 2024-03-29 NOTE — ED Notes (Signed)
 Pt removed bipap and requested to take a break. Pt reports increased chest pain. Repeat EKG captured and MD notified.

## 2024-03-29 NOTE — ED Notes (Signed)
 RT at bedside.
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
## 2024-03-29 NOTE — Progress Notes (Signed)
 Placed back on BIPAP due to increased WOB.

## 2024-03-29 NOTE — Hospital Course (Signed)
 SOB started last night Used his albuterol  but didn't help much He was fixing breakfast and suddenly felt short of breath ,started breathing rapidly . 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

## 2024-03-30 ENCOUNTER — Telehealth: Payer: Self-pay | Admitting: Neurology

## 2024-03-30 ENCOUNTER — Institutional Professional Consult (permissible substitution): Admitting: Neurology

## 2024-03-30 DIAGNOSIS — Z7951 Long term (current) use of inhaled steroids: Secondary | ICD-10-CM | POA: Diagnosis not present

## 2024-03-30 DIAGNOSIS — G4733 Obstructive sleep apnea (adult) (pediatric): Secondary | ICD-10-CM | POA: Diagnosis present

## 2024-03-30 DIAGNOSIS — L97419 Non-pressure chronic ulcer of right heel and midfoot with unspecified severity: Secondary | ICD-10-CM

## 2024-03-30 DIAGNOSIS — I2510 Atherosclerotic heart disease of native coronary artery without angina pectoris: Secondary | ICD-10-CM | POA: Diagnosis present

## 2024-03-30 DIAGNOSIS — E1140 Type 2 diabetes mellitus with diabetic neuropathy, unspecified: Secondary | ICD-10-CM | POA: Diagnosis present

## 2024-03-30 DIAGNOSIS — J45901 Unspecified asthma with (acute) exacerbation: Secondary | ICD-10-CM | POA: Diagnosis present

## 2024-03-30 DIAGNOSIS — Z7722 Contact with and (suspected) exposure to environmental tobacco smoke (acute) (chronic): Secondary | ICD-10-CM | POA: Diagnosis present

## 2024-03-30 DIAGNOSIS — T380X5A Adverse effect of glucocorticoids and synthetic analogues, initial encounter: Secondary | ICD-10-CM | POA: Diagnosis present

## 2024-03-30 DIAGNOSIS — E785 Hyperlipidemia, unspecified: Secondary | ICD-10-CM | POA: Diagnosis present

## 2024-03-30 DIAGNOSIS — J441 Chronic obstructive pulmonary disease with (acute) exacerbation: Secondary | ICD-10-CM | POA: Diagnosis not present

## 2024-03-30 DIAGNOSIS — Z79899 Other long term (current) drug therapy: Secondary | ICD-10-CM | POA: Diagnosis not present

## 2024-03-30 DIAGNOSIS — E1165 Type 2 diabetes mellitus with hyperglycemia: Secondary | ICD-10-CM | POA: Diagnosis present

## 2024-03-30 DIAGNOSIS — E11621 Type 2 diabetes mellitus with foot ulcer: Secondary | ICD-10-CM

## 2024-03-30 DIAGNOSIS — L84 Corns and callosities: Secondary | ICD-10-CM | POA: Diagnosis present

## 2024-03-30 DIAGNOSIS — E1151 Type 2 diabetes mellitus with diabetic peripheral angiopathy without gangrene: Secondary | ICD-10-CM | POA: Diagnosis present

## 2024-03-30 DIAGNOSIS — Z794 Long term (current) use of insulin: Secondary | ICD-10-CM | POA: Diagnosis not present

## 2024-03-30 DIAGNOSIS — Z7902 Long term (current) use of antithrombotics/antiplatelets: Secondary | ICD-10-CM | POA: Diagnosis not present

## 2024-03-30 DIAGNOSIS — R0902 Hypoxemia: Secondary | ICD-10-CM | POA: Diagnosis present

## 2024-03-30 DIAGNOSIS — Z7982 Long term (current) use of aspirin: Secondary | ICD-10-CM | POA: Diagnosis not present

## 2024-03-30 DIAGNOSIS — Z89422 Acquired absence of other left toe(s): Secondary | ICD-10-CM | POA: Diagnosis not present

## 2024-03-30 DIAGNOSIS — Z1152 Encounter for screening for COVID-19: Secondary | ICD-10-CM | POA: Diagnosis not present

## 2024-03-30 DIAGNOSIS — R0789 Other chest pain: Secondary | ICD-10-CM | POA: Diagnosis not present

## 2024-03-30 DIAGNOSIS — D509 Iron deficiency anemia, unspecified: Secondary | ICD-10-CM | POA: Diagnosis present

## 2024-03-30 DIAGNOSIS — I10 Essential (primary) hypertension: Secondary | ICD-10-CM | POA: Diagnosis present

## 2024-03-30 DIAGNOSIS — Z87891 Personal history of nicotine dependence: Secondary | ICD-10-CM | POA: Diagnosis not present

## 2024-03-30 DIAGNOSIS — R0603 Acute respiratory distress: Secondary | ICD-10-CM | POA: Diagnosis not present

## 2024-03-30 LAB — CBC
HCT: 35.3 % — ABNORMAL LOW (ref 39.0–52.0)
Hemoglobin: 10.6 g/dL — ABNORMAL LOW (ref 13.0–17.0)
MCH: 23.1 pg — ABNORMAL LOW (ref 26.0–34.0)
MCHC: 30 g/dL (ref 30.0–36.0)
MCV: 76.9 fL — ABNORMAL LOW (ref 80.0–100.0)
Platelets: 259 10*3/uL (ref 150–400)
RBC: 4.59 MIL/uL (ref 4.22–5.81)
RDW: 22.5 % — ABNORMAL HIGH (ref 11.5–15.5)
WBC: 10.4 10*3/uL (ref 4.0–10.5)
nRBC: 0 % (ref 0.0–0.2)

## 2024-03-30 LAB — BASIC METABOLIC PANEL WITH GFR
Anion gap: 7 (ref 5–15)
BUN: 24 mg/dL — ABNORMAL HIGH (ref 8–23)
CO2: 24 mmol/L (ref 22–32)
Calcium: 9 mg/dL (ref 8.9–10.3)
Chloride: 102 mmol/L (ref 98–111)
Creatinine, Ser: 1.06 mg/dL (ref 0.61–1.24)
GFR, Estimated: >60 mL/min (ref >60)
Glucose, Bld: 189 mg/dL — ABNORMAL HIGH (ref 70–99)
Potassium: 4.4 mmol/L (ref 3.5–5.1)
Sodium: 133 mmol/L — ABNORMAL LOW (ref 135–145)

## 2024-03-30 LAB — GLUCOSE, CAPILLARY
Glucose-Capillary: 346 mg/dL — ABNORMAL HIGH (ref 70–99)
Glucose-Capillary: 225 mg/dL — ABNORMAL HIGH (ref 70–99)
Glucose-Capillary: 349 mg/dL — ABNORMAL HIGH (ref 70–99)

## 2024-03-30 LAB — CBG MONITORING, ED: Glucose-Capillary: 128 mg/dL — ABNORMAL HIGH (ref 70–99)

## 2024-03-30 MED ORDER — LIDOCAINE 5 % EX PTCH
1.0000 | MEDICATED_PATCH | CUTANEOUS | Status: DC
  Administered 2024-03-30 – 2024-03-31 (×2): 1 via TRANSDERMAL
  Filled 2024-03-30 (×2): qty 1

## 2024-03-30 MED ORDER — REVEFENACIN 175 MCG/3ML IN SOLN
175.0000 ug | Freq: Every day | RESPIRATORY_TRACT | Status: DC
  Administered 2024-03-30 – 2024-04-01 (×3): 175 ug via RESPIRATORY_TRACT
  Filled 2024-03-30 (×3): qty 3

## 2024-03-30 MED ORDER — IPRATROPIUM-ALBUTEROL 0.5-2.5 (3) MG/3ML IN SOLN
3.0000 mL | Freq: Once | RESPIRATORY_TRACT | Status: AC
  Administered 2024-03-30: 3 mL via RESPIRATORY_TRACT
  Filled 2024-03-30: qty 3

## 2024-03-30 MED ORDER — FERROUS SULFATE 325 (65 FE) MG PO TABS
325.0000 mg | ORAL_TABLET | Freq: Every day | ORAL | Status: DC
  Administered 2024-03-31 – 2024-04-01 (×2): 325 mg via ORAL
  Filled 2024-03-30 (×2): qty 1

## 2024-03-30 MED ORDER — ARFORMOTEROL TARTRATE 15 MCG/2ML IN NEBU
15.0000 ug | INHALATION_SOLUTION | Freq: Two times a day (BID) | RESPIRATORY_TRACT | Status: DC
  Administered 2024-03-30 – 2024-04-01 (×5): 15 ug via RESPIRATORY_TRACT
  Filled 2024-03-30 (×5): qty 2

## 2024-03-30 MED ORDER — ACETAMINOPHEN 325 MG PO TABS
650.0000 mg | ORAL_TABLET | Freq: Four times a day (QID) | ORAL | Status: DC | PRN
  Administered 2024-03-30 – 2024-03-31 (×2): 650 mg via ORAL
  Filled 2024-03-30 (×2): qty 2

## 2024-03-30 MED ORDER — ATORVASTATIN CALCIUM 40 MG PO TABS
40.0000 mg | ORAL_TABLET | Freq: Every day | ORAL | Status: DC
  Administered 2024-03-30 – 2024-04-01 (×3): 40 mg via ORAL
  Filled 2024-03-30 (×3): qty 1

## 2024-03-30 MED ORDER — DICLOFENAC SODIUM 1 % EX GEL
2.0000 g | Freq: Four times a day (QID) | CUTANEOUS | Status: DC
  Administered 2024-03-30 – 2024-03-31 (×4): 2 g via TOPICAL
  Filled 2024-03-30: qty 100

## 2024-03-30 MED ORDER — GUAIFENESIN ER 600 MG PO TB12
600.0000 mg | ORAL_TABLET | Freq: Two times a day (BID) | ORAL | Status: DC
  Administered 2024-03-30 – 2024-04-01 (×5): 600 mg via ORAL
  Filled 2024-03-30 (×5): qty 1

## 2024-03-30 MED ORDER — MELATONIN 5 MG PO TABS
10.0000 mg | ORAL_TABLET | Freq: Every day | ORAL | Status: DC
  Administered 2024-03-30 – 2024-03-31 (×2): 10 mg via ORAL
  Filled 2024-03-30 (×2): qty 2

## 2024-03-30 MED ORDER — IPRATROPIUM-ALBUTEROL 0.5-2.5 (3) MG/3ML IN SOLN
3.0000 mL | RESPIRATORY_TRACT | Status: DC | PRN
  Administered 2024-03-30 – 2024-03-31 (×2): 3 mL via RESPIRATORY_TRACT
  Filled 2024-03-30 (×3): qty 3

## 2024-03-30 MED ORDER — BUDESONIDE 0.25 MG/2ML IN SUSP
0.2500 mg | Freq: Two times a day (BID) | RESPIRATORY_TRACT | Status: DC
  Administered 2024-03-30 – 2024-04-01 (×5): 0.25 mg via RESPIRATORY_TRACT
  Filled 2024-03-30 (×5): qty 2

## 2024-03-30 NOTE — Progress Notes (Addendum)
 Subjective:  Interval events: Patient was placed on BiPAP for a few hours overnight due to increased work of breathing. He was transitioned to 2L [place] and then room air by this morning.  Patient is feeling okay this morning. He is tired after sleeping very little overnight. He also continues to experience right-sided chest pain that is exacerbated by taking deep breaths. The lidocaine  patch helped some, but the pain is still bothersome. He thinks the chest pain is from his asthma.  Objective:  Vital signs in last 24 hours: Vitals:   03/30/24 0900 03/30/24 1012 03/30/24 1200 03/30/24 1220  BP: 135/67  (!) 145/74 (!) 141/74  Pulse: 97  100 (!) 102  Resp: 19  17 20   Temp:  98 F (36.7 C) 98.6 F (37 C) 98.7 F (37.1 C)  TempSrc:  Oral Oral Oral  SpO2: 100%  99% 96%  Weight:      Height:       Intake/Output Summary (Last 24 hours) at 03/30/2024 1253 Last data filed at 03/30/2024 0600 Gross per 24 hour  Intake --  Output 1850 ml  Net -1850 ml   General: Older-appearing male sitting upright in bed, able to speak in short sentences without pausing, in NAD HENT: Normocephalic and atraumatic. No nasal congestion or rhinorrhea. Oropharynx without lesions, erythema, or exudate. Poor dentition. Eyes: Conjunctivae clear. EOMI. PERRL.  Cardiovascular: Normal rate with regular rhythm. III/VI systolic murmur heard best at RUSB. No LE edema.  Pulmonary: Decreased air movement throughout with scattered inspiratory and expiratory wheezes. Mildly increased respiratory effort on room air associated with coughing spells. Abdominal: Soft. Non-distended. Normal bowel sounds.  Musculoskeletal: Right sternal border and right middle ribs exquisitely tender to palpation.  Neurological: Alert, responds appropriately to questions. Decreased sensation to touch on bilateral feet.  Skin: Warm and dry. Callus on plantar surface of right foot near first metatarsal head. No surrounding erythema, edema, or  drainage.  Assessment/Plan:  Active Problems:   Diabetic ulcer of right midfoot associated with type 2 diabetes mellitus (HCC)   COPD exacerbation (HCC)   COPD with acute exacerbation [facility])   Respiratory distress  Richard [name] is a 63 year-old male with PMH of COPD/Asthma, CAD, moderate AS/MS, HTN, PVD, and T2DM who presented with shortness of breath and admitted on 03/29/2024 for a COPD Exacerbation.   #Acute Asthma/COPD Exacerbation  Patient was stable on room air when seen this morning, though lung exam still with decreased air movement bilaterally and scattered inspiratory and expiratory wheezes. Will continue treatment with prednisone , LABA/LAMA/ICS nebulizers, and DuoNebs.  - Prednisone  40 mg daily (day 2 of 5) - Yupelri  nebulizer daily - Pulmicort  nebulizer BID - Brovana  nebulizer BID - DuoNebs Q4H PRN  - Supplemental O2 for SpO2 <90 %   #Chest Pain  Patient continues to experience pleuritic, right-sided chest pain that radiates down his right arm. EKGs unchanged from piror and troponin flat (4->4). Suspect chest pain is musculoskeletal since the pain is reproducible on exam with palpation of the sternum and right ribs. Will treat with lidocaine  patches, Tylenol , and Voltaren  gel.  - PRN Tylenol  650 mg Q6H - PRN Voltaren  1% gel - Lidocaine  patch Q24H   #Type 2 Diabetes Mellitus Glucose elevated between 179-346 while hospitalized, likely secondary to steroid use for asthma/COPD. Will continue supplementing home regimen with SSI. - Home Tresiba  20 units nightly - SSI - CBGs ACHS   #Iron  deficiency anemia Patient has a chronic microcytic anemia for which he is receiving iron   infusions. Anemia stable on admission with Hgb 10.6 and MCV 76.9. Most recent iron  studies in 02/2024 with low iron  (33), increased TIBC (457), and low TSAT (7%). Colonoscopy due 01/24/2023 per chart review, so will recommend patient complete this outpatient. Will otherwise start PO ferrous sulfate  325 mg  daily. - Start ferrous sulfate  325 mg daily  #HLD #CAD #PAD - Home clopidogrel  75 mg daily - Home aspirin  81 mg daily - Home atorvastatin  40 mg daily   #Neuropathy  #S/P L Third Toe amputation  - Home pregabalin  100 mg daily   LOS: 0 days   Richard [name], Medical Student 03/30/2024, 12:53 PM

## 2024-03-30 NOTE — Evaluation (Signed)
 Physical Therapy Evaluation Patient Details Name: Richard [name] MRN: [id number] DOB: 1961/08/24 Today's Date: 03/30/2024  History of Present Illness  Pt is a 63 y/o male admitted 6/22 with worsening SOB. He was on bipap, then on 2L with SpO2 at 100%.  He has reproducible CP.  PMH includes COPD, asthma, HTN, HLD, CAD, T2DM, recent L transmetatarsal amputation (09/18/23)  Clinical Impression  Pt admitted with/for worsening SOB.  Pt is presently improved, but still far from baseline function, needing CGA to supervision..  Pt currently limited functionally due to the problems listed below.  (see problems list.)  Pt will benefit from PT to maximize function and safety to be able to get home safely with available assist .         If plan is discharge home, recommend the following:  (PRN assist)   Can travel by private vehicle        Equipment Recommendations None recommended by PT  Recommendations for Other Services       Functional Status Assessment Patient has had a recent decline in their functional status and demonstrates the ability to make significant improvements in function in a reasonable and predictable amount of time.     Precautions / Restrictions Precautions Precautions: Fall Recall of Precautions/Restrictions: Intact      Mobility  Bed Mobility Overal bed mobility: Modified Independent                  Transfers Overall transfer level: Needs assistance   Transfers: Sit to/from Stand Sit to Stand: Supervision           General transfer comment: safe transfer with cues for hand placement    Ambulation/Gait Ambulation/Gait assistance: Supervision Gait Distance (Feet): 130 Feet Assistive device: Quad cane Gait Pattern/deviations: Step-through pattern   Gait velocity interpretation: <1.8 ft/sec, indicate of risk for recurrent falls   General Gait Details: safe use of the cane quad vs usual SPC.  Slower cadence, mild instability, managed well by  cane.  Sats on RA 96% and HR  100-109 bpm, dyspnea 2/4  Stairs            Wheelchair Mobility     Tilt Bed    Modified Rankin (Stroke Patients Only)       Balance Overall balance assessment: Needs assistance Sitting-balance support: Single extremity supported, Feet supported Sitting balance-Leahy Scale: Good Sitting balance - Comments: no UE's needed   Standing balance support: Single extremity supported, No upper extremity supported Standing balance-Leahy Scale: Fair Standing balance comment: mildly unsteady with/without cane.                             Pertinent Vitals/Pain Pain Assessment Pain Assessment: 0-10 Pain Score: 0-No pain Pain Intervention(s): Monitored during session    Home Living Family/patient expects to be discharged to:: Private residence Living Arrangements: Alone;Non-relatives/Friends (lives with fiance) Available Help at Discharge: Family;Available PRN/intermittently Type of Home: House Home Access: Stairs to enter Entrance Stairs-Rails: None Entrance Stairs-Number of Steps: 1 at back and 3 at front Alternate Level Stairs-Number of Steps: flight Home Layout: Two level;1/2 bath on main level;Bed/bath upstairs Home Equipment: Cane - single point;BSC/3in1;Grab bars - tub/shower;Wheelchair - manual;Grab bars - toilet;Hand held Stage manager (4 wheels) Additional Comments: son available, church family and fiance after work.    Prior Function Prior Level of Function : Independent/Modified Independent             Mobility Comments: SPC, going  to culinary school  M, T, TH ADLs Comments: mod I, notably harder recently, but he does his own cooking/cleaning     Extremity/Trunk Assessment   Upper Extremity Assessment Upper Extremity Assessment: Right hand dominant (R UE held in adduction and flexion as if has had a neurological incident, but moves functionally with intention.)    Lower Extremity Assessment Lower  Extremity Assessment: Overall WFL for tasks assessed;Generalized weakness (neuropathy and L TMA makes balance and gait stability more difficult with need for a cane)    Cervical / Trunk Assessment Cervical / Trunk Assessment: Normal  Communication   Communication Communication: No apparent difficulties    Cognition Arousal: Alert Behavior During Therapy: WFL for tasks assessed/performed   PT - Cognitive impairments: No apparent impairments                         Following commands: Intact       Cueing Cueing Techniques: Verbal cues     General Comments      Exercises     Assessment/Plan    PT Assessment Patient needs continued PT services  PT Problem List Decreased strength;Decreased activity tolerance;Decreased balance;Decreased mobility;Decreased coordination;Cardiopulmonary status limiting activity       PT Treatment Interventions Gait training;Functional mobility training;Stair training;Therapeutic activities;Balance training;Patient/family education    PT Goals (Current goals can be found in the Care Plan section)  Acute Rehab PT Goals Patient Stated Goal: safely Independent at home, back to my school and family activity. PT Goal Formulation: With patient Time For Goal Achievement: 04/13/24 Potential to Achieve Goals: Good    Frequency Min 3X/week     Co-evaluation               AM-PAC PT 6 Clicks Mobility  Outcome Measure Help needed turning from your back to your side while in a flat bed without using bedrails?: None Help needed moving from lying on your back to sitting on the side of a flat bed without using bedrails?: None Help needed moving to and from a bed to a chair (including a wheelchair)?: A Little Help needed standing up from a chair using your arms (e.g., wheelchair or bedside chair)?: A Little Help needed to walk in hospital room?: A Little Help needed climbing 3-5 steps with a railing? : A Little 6 Click Score: 20     End of Session   Activity Tolerance: Patient tolerated treatment well;Patient limited by fatigue Patient left: in bed;with call bell/phone within reach;with family/visitor present Nurse Communication: Mobility status PT Visit Diagnosis: Unsteadiness on feet (R26.81);Difficulty in walking, not elsewhere classified (R26.2)    Time: 8951-8888 PT Time Calculation (min) (ACUTE ONLY): 23 min   Charges:   PT Evaluation $PT Eval Moderate Complexity: 1 Mod PT Treatments $Gait Training: 8-22 mins PT General Charges $$ ACUTE PT VISIT: 1 Visit         03/30/2024  India HERO., PT Acute Rehabilitation Services [phone]  (office)  Vinie GAILS Xariah Silvernail 03/30/2024, 11:32 AM

## 2024-03-30 NOTE — Telephone Encounter (Signed)
 I reviewed patient's record in preparation for a sleep consult visit.  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  We contacted the patient this morning and he picked up the phone himself although he was in the hospital.  He is hospitalized currently for COPD exacerbation and respiratory distress (upon chart review).  He is agreeable to addressing sleep disorder including OSA concern through pulmonology and requested a MyChart message reminder.  He was agreeable to canceling today's visit and in fact reported that he was not going to come, as he is currently in the hospital.

## 2024-03-30 NOTE — Progress Notes (Signed)
 Pt arrived from ..ED..., A/ox .4.Marland Kitchenpt denies any pain, MD aware,CCMD called. CHG bath given,no further needs at this time

## 2024-03-31 LAB — GLUCOSE, CAPILLARY
Glucose-Capillary: 158 mg/dL — ABNORMAL HIGH (ref 70–99)
Glucose-Capillary: 166 mg/dL — ABNORMAL HIGH (ref 70–99)
Glucose-Capillary: 467 mg/dL — ABNORMAL HIGH (ref 70–99)
Glucose-Capillary: 341 mg/dL — ABNORMAL HIGH (ref 70–99)

## 2024-03-31 MED ORDER — IPRATROPIUM-ALBUTEROL 0.5-2.5 (3) MG/3ML IN SOLN
3.0000 mL | RESPIRATORY_TRACT | Status: DC
  Administered 2024-03-31 – 2024-04-01 (×6): 3 mL via RESPIRATORY_TRACT
  Filled 2024-03-31 (×6): qty 3

## 2024-03-31 MED ORDER — INSULIN ASPART 100 UNIT/ML IJ SOLN
15.0000 [IU] | Freq: Once | INTRAMUSCULAR | Status: AC
  Administered 2024-03-31: 15 [IU] via SUBCUTANEOUS

## 2024-03-31 MED ORDER — METHOCARBAMOL 500 MG PO TABS
500.0000 mg | ORAL_TABLET | Freq: Four times a day (QID) | ORAL | Status: DC
  Administered 2024-03-31 – 2024-04-01 (×4): 500 mg via ORAL
  Filled 2024-03-31 (×4): qty 1

## 2024-03-31 MED ORDER — IBUPROFEN 400 MG PO TABS
600.0000 mg | ORAL_TABLET | Freq: Three times a day (TID) | ORAL | Status: DC
  Administered 2024-03-31 – 2024-04-01 (×4): 600 mg via ORAL
  Filled 2024-03-31 (×4): qty 2

## 2024-03-31 MED ORDER — METHOCARBAMOL 500 MG PO TABS
500.0000 mg | ORAL_TABLET | Freq: Once | ORAL | Status: AC
  Administered 2024-03-31: 500 mg via ORAL
  Filled 2024-03-31: qty 1

## 2024-03-31 MED ORDER — AZITHROMYCIN 500 MG PO TABS
500.0000 mg | ORAL_TABLET | Freq: Every day | ORAL | Status: DC
  Administered 2024-03-31 – 2024-04-01 (×2): 500 mg via ORAL
  Filled 2024-03-31 (×2): qty 1

## 2024-03-31 NOTE — Progress Notes (Signed)
  Progress Note   Date: 03/31/2024  Patient Name: Richard [name]        MRN#: [id number]  Review the patient's clinical findings supports the diagnosis of:   Diabetes mellitus type 2 with hyperglycemia

## 2024-03-31 NOTE — Progress Notes (Signed)
  Progress Note   Date: 03/31/2024  Patient Name: Richard [name]        MRN#: [id number]  Clarification of diagnosis:  Chronic obstructive asthma with exacerbation

## 2024-03-31 NOTE — Discharge Summary (Signed)
 Name: Richard [name] MRN: [id number] DOB: 12-31-1960 63 y.o. PCP: Rosan [place] [name], DO  Date of Admission: 03/29/2024 11:13 AM Date of Discharge:  04/01/2024 Attending Physician: Dr. Eben  DISCHARGE DIAGNOSIS:  Primary Problem: <principal problem not specified>   Hospital Problems: Active Problems:   Diabetic ulcer of right midfoot associated with type 2 diabetes mellitus (HCC)   COPD exacerbation (HCC)   COPD with acute exacerbation (HCC)   Respiratory distress    DISCHARGE MEDICATIONS:   Allergies as of 04/01/2024       Reactions   Shellfish Allergy Anaphylaxis, Shortness Of Breath        Medication List     TAKE these medications    Accu-Chek Softclix Lancets lancets 1 each by Other route See admin instructions. 1-2 times daily   albuterol  (2.5 MG/3ML) 0.083% nebulizer solution Commonly known as: PROVENTIL  Take 3 mLs (2.5 mg total) by nebulization 2 (two) times daily.   albuterol  108 (90 Base) MCG/ACT inhaler Commonly known as: VENTOLIN  HFA Inhale 1-2 puffs into the lungs every 4 (four) hours as needed for wheezing or shortness of breath.   aspirin  EC 81 MG tablet Take 1 tablet (81 mg total) by mouth daily.   atorvastatin  40 MG tablet Commonly known as: LIPITOR Take 40 mg by mouth daily.   azithromycin  500 MG tablet Commonly known as: ZITHROMAX  Take 1 tablet (500 mg total) by mouth daily. Start taking on: April 02, 2024   Breztri  Aerosphere 160-9-4.8 MCG/ACT Aero inhaler Generic drug: budesonide -glycopyrrolate -formoterol  Inhale 2 puffs into the lungs in the morning and at bedtime.   cetirizine  10 MG tablet Commonly known as: ZyrTEC  Allergy Take 1 tablet (10 mg total) by mouth daily.   clopidogrel  75 MG tablet Commonly known as: PLAVIX  Take 1 tablet (75 mg total) by mouth daily.   diphenhydrAMINE  25 mg capsule Commonly known as: Benadryl  Allergy Take 1 capsule (25 mg total) by mouth at bedtime as needed for allergies or itching.   Dupixent  300 MG/2ML Soaj Generic drug: Dupilumab Inject 300 mg into the skin every 14 (fourteen) days.   escitalopram  5 MG tablet Commonly known as: Lexapro  Take 1 tablet (5 mg total) by mouth daily.   ferrous sulfate  325 (65 FE) MG tablet Take 1 tablet (325 mg total) by mouth daily.   fluticasone  50 MCG/ACT nasal spray Commonly known as: FLONASE  Place 1 spray into both nostrils as needed for allergies.   FreeStyle Libre 3 Sensor Misc Place 1 sensor on the skin every 14 days. Use to check glucose continuously.   glucose blood test strip 1 each by Other route in the morning and at bedtime.   ibuprofen  600 MG tablet Commonly known as: ADVIL  Take 1 tablet (600 mg total) by mouth 3 (three) times daily.   insulin  degludec 100 UNIT/ML FlexTouch Pen Commonly known as: TRESIBA  Inject 20 Units into the skin daily.   ipratropium-albuterol  0.5-2.5 (3) MG/3ML Soln Commonly known as: DUONEB Use 1 vial (3 mLs) by nebulization every 6 (six) hours as needed.   methocarbamol  500 MG tablet Commonly known as: ROBAXIN  Take 1 tablet (500 mg total) by mouth every 6 (six) hours.   montelukast  10 MG tablet Commonly known as: SINGULAIR  Take 1 tablet (10 mg total) by mouth at bedtime.   Nucala  100 MG/ML Soaj Generic drug: Mepolizumab  Inject 1 mL (100 mg total) into the skin every 28 (twenty-eight) days.   Pen Needles 32G X 4 MM Misc Use 1 each daily as directed.  pioglitazone  30 MG tablet Commonly known as: ACTOS  Take 30 mg by mouth daily.   predniSONE  20 MG tablet Commonly known as: DELTASONE  Take 2 tablets (40 mg total) by mouth daily with breakfast. Start taking on: April 02, 2024   pregabalin  100 MG capsule Commonly known as: LYRICA  Take 1 capsule (100 mg total) by mouth 3 (three) times daily.   Synjardy  12.02-999 MG Tabs Generic drug: Empagliflozin -metFORMIN  HCl SMARTSIG:1 Tablet(s) By Mouth Morning-Night   tadalafil  10 MG tablet Commonly known as: Cialis  Take 1 tablet (10 mg  total) by mouth as needed for erectile dysfunction.   triamcinolone  cream 0.1 % Commonly known as: KENALOG  Apply 1 Application topically 2 (two) times daily.   Trulicity  4.5 MG/0.5ML Soaj Generic drug: Dulaglutide  Inject 4.5 mg as directed once a week.        DISPOSITION AND FOLLOW-UP:  Mr.Langston Aleman was discharged from [facility] in stable condition. At the hospital follow up visit please address:  Follow-up Appointments: - [facility] on 04/02/2024 at 8:45AM  HOSPITAL COURSE:  Patient Summary: Richard [name] is a 63 y.o. person living with a history stated above who presented with shortness of breath and admitted for a COPD Exacerbation on hospital day 0   #Acute Asthma/COPD Exacerbation  Patient presented with 1-day history of shortness of breath in the setting of recent second-hand smoke exposure during a trip to [place] , which was consistent with an asthma/COPD exacerbation. He was initially placed on BiPAP due to hypoxia and increased work of breathing but was weaned to room air within the first 12 hours of admission. He intermittently required 2L [place] overnight likely due to his presumed OSA (sleep study scheduled). He was treated with prednisone  40 mg daily x 4 days, azithromycin  500 mg x 3 days, scheduled LAMA/LABA/ICS nebulizers (Yupelri /Brovana /Pulmicort ), and scheduled DuoNebs Q4H while awake. Patient was transitioned to home Breztri  and albuterol  inhalers at discharge.   #Chest Pain  Patient reported pleuritic, right-sided chest pain that radiated down his right arm throughout hospitalization. EKGs unchanged from prior and troponin was flat (4->4). Pain likely musculoskeletal since it was reproducible on exam with palpation of the sternum and right ribs. His pain was treated with lidocaine  patches, Tylenol , and Voltaren  gel, scheduled ibuprofen  Q6H, and scheduled Robaxin  Q6H. He was discharged with 5-day supply of Robaxin .    #Type 2 Diabetes Mellitus Glucose ranged between 166-467 while hospitalized, likely elevated due to prednisone  use for asthma/COPD. Patient was continued on home Semglee  20 units while hospitalized. He also received sliding scale insulin  since blood glucose elevated from steroid use.    #Iron  deficiency anemia Patient has a chronic microcytic anemia for which he is receiving iron  infusions outpatient. Anemia was stable on admission with Hgb 10.6 and MCV 76.9. Most recent iron  studies in 02/2024 with low iron  (33), increased TIBC (457), and low TSAT (7%). Colonoscopy due 01/24/2023 per chart review, so recommended patient complete outpatient. Also started PO ferrous sulfate  325 mg daily to supplement.  #HLD #CAD Patient was continued on home clopidogrel  75 mg daily, aspirin  81 mg daily, and atorvastatin  40 mg daily while hospitalized.   #Neuropathy  #S/P L Third Toe amputation  Patient was continued on home pregabalin  100 mg TID while hospitalized.   SUBJECTIVE:  Patient remained on room air overnight. He is feeling significantly better this morning. His breathing feels closer to baseline, and his chest/rib pain is still present but minimal compared to yesterday. He is ready to go  home.  Discharge Vitals:   BP 130/70 (BP Location: Right Arm)   Pulse 95   Temp 98 F (36.7 C) (Oral)   Resp 20   Ht 6' 3 (1.905 m)   Wt 101.6 kg   SpO2 98%   BMI 28.00 kg/m   OBJECTIVE:  General: Older-appearing male sitting upright on edge of bed, able to speak in full sentences without pausing, in NAD. HENT: Normocephalic and atraumatic. Conjunctivae clear. No nasal congestion or rhinorrhea. Cardiovascular: Normal rate with regular rhythm. III/VI systolic murmur heard best at RUSB. No LE edema.  Pulmonary: Improved air movement throughout with scattered expiratory wheezes. Normal respiratory effort on room air. Patient able to take deep breaths without rib pain. Musculoskeletal: Right sternal border and  right lateral ribs mildly tender to palpation.  Neurological: Alert, responds appropriately to questions.  Skin: Warm and dry.  Pertinent Labs, Studies, and Procedures:     Latest Ref Rng & Units 04/01/2024    3:30 AM 03/30/2024    4:15 AM 03/29/2024   11:34 AM  CBC  WBC 4.0 - 10.5 K/uL 7.4  10.4    Hemoglobin 13.0 - 17.0 g/dL 89.3  89.3  87.3   Hematocrit 39.0 - 52.0 % 34.9  35.3  37.0   Platelets 150 - 400 K/uL 263  259         Latest Ref Rng & Units 04/01/2024    3:30 AM 03/30/2024    4:15 AM 03/29/2024   11:34 AM  CMP  Glucose 70 - 99 mg/dL 775  810    BUN 8 - 23 mg/dL 21  24    Creatinine 9.38 - 1.24 mg/dL 9.17  8.93    Sodium 864 - 145 mmol/L 135  133  135   Potassium 3.5 - 5.1 mmol/L 4.1  4.4  4.5   Chloride 98 - 111 mmol/L 103  102    CO2 22 - 32 mmol/L 24  24    Calcium  8.9 - 10.3 mg/dL 8.9  9.0      DG Chest Port 1 View Result Date: 03/29/2024 FINDINGS: Normal mediastinum and cardiac silhouette. Normal pulmonary vasculature. No evidence of effusion, infiltrate, or pneumothorax. No acute bony abnormality. IMPRESSION: No acute cardiopulmonary process. Electronically Signed   By: Jackquline Boxer M.D.   On: 03/29/2024 12:00   Signed: Drue Lisa Grow MD 04/01/2024, 11:00 AM

## 2024-03-31 NOTE — Progress Notes (Signed)
 Subjective:  Patient was placed on 2L [place] for a few hours overnight. His breathing feels about the same as yesterday. His main concern is the right-sided chest pain that worsens with deep breaths or when lying on that side in the bed. The pain has not improved with lidocaine  patches, Tylenol , and Voltaren  gels. He has no difficulties with walking but does experience significant shortness of breath. He does not feel safe being discharged today.   Objective:  Vital signs in last 24 hours: Vitals:   03/31/24 0335 03/31/24 0821 03/31/24 0825 03/31/24 1144  BP: (!) 132/90 114/71  130/82  Pulse: 88 92 90 89  Resp:  20 19   Temp: 98.4 F (36.9 C) 98.5 F (36.9 C)  (!) 97.3 F (36.3 C)  TempSrc: Oral Oral  Oral  SpO2: 98% 98%  97%  Weight:      Height:       Intake/Output Summary (Last 24 hours) at 03/31/2024 1346 Last data filed at 03/31/2024 0815 Gross per 24 hour  Intake 480 ml  Output --  Net 480 ml   General: Older-appearing male sitting upright on edge of bed, able to speak in short sentences without pausing, in NAD. HENT: Normocephalic and atraumatic. Conjunctivae clear. No nasal congestion or rhinorrhea. Cardiovascular: Normal rate with regular rhythm. III/VI systolic murmur heard best at RUSB. No LE edema.  Pulmonary: Decreased air movement at lung bases with scattered expiratory wheezes. Mildly increased respiratory effort on room air associated with coughing spells. Patient with shallow breaths at rest due to rib pain. Abdominal: Soft. Non-distended. Normal bowel sounds.  Musculoskeletal: Right sternal border and right lateral ribs exquisitely tender to palpation. Lidocaine  patch in place. Neurological: Alert, responds appropriately to questions.  Skin: Warm and dry.   Assessment/Plan:  Active Problems:   Diabetic ulcer of right midfoot associated with type 2 diabetes mellitus (HCC)   COPD exacerbation (HCC)   COPD with acute exacerbation [facility])   Respiratory  distress  Richard [name] is a 63 year-old male with PMH of COPD/Asthma, CAD, moderate AS/MS, HTN, PVD, and T2DM who presented with shortness of breath and admitted on 03/29/2024 for a COPD Exacerbation.    #Acute Asthma/COPD Exacerbation  Patient was stable on room air when seen this morning. Lung exam with decreased air movement at lung bases and scattered expiratory wheezes, which has improved since yesterday. Will continue treatment with prednisone  and LABA/LAMA/ICS nebulizers. Will also schedule DuoNebs while awake since patient finds them helpful. Will also start azithromycin  even though he does not have sputum changes since his length of hospitalization suggests a moderate exacerbation. - Prednisone  40 mg daily (day 2 of 5) - SCH DuoNebs Q4H - Azithromycin  500 mg x 3 days (start 6/24) - Yupelri  nebulizer daily - Pulmicort  nebulizer BID - Brovana  nebulizer BID - Supplemental O2 for SpO2 <90 %   #Chest Pain  Patient continues to experience pleuritic, right-sided chest pain that radiates down his right arm. EKGs unchanged from piror and troponin flat (4->4). Suspect chest pain is musculoskeletal since the pain is reproducible on exam with palpation of the sternum and right ribs. Will treat with lidocaine  patches, Tylenol , and Voltaren  gel. Will also start scheduled ibuprofen  and Robaxin  since there has not been much improvement with the other medications. - Start Robaxin  500 mg Q6H - Start ibuprofen  600 mg TID - PRN Tylenol  650 mg Q6H - PRN Voltaren  1% gel - Lidocaine  patch Q24H   #Type 2 Diabetes Mellitus Glucose elevated between 158-346  during the past 24 hours, likely secondary to steroid use for asthma/COPD. Will continue supplementing home regimen with SSI. - Home Tresiba  20 units nightly - SSI - CBGs ACHS   #Iron  deficiency anemia Patient has a chronic microcytic anemia for which he is receiving iron  infusions. Anemia stable on admission with Hgb 10.6 and MCV 76.9. Most recent  iron  studies in 02/2024 with low iron  (33), increased TIBC (457), and low TSAT (7%). Colonoscopy due 01/24/2023 per chart review, so will recommend patient complete this outpatient. Will otherwise start PO ferrous sulfate  325 mg daily. - Continue ferrous sulfate  325 mg daily   #HLD #CAD #PAD - Home clopidogrel  75 mg daily - Home aspirin  81 mg daily - Home atorvastatin  40 mg daily   #Neuropathy  #S/P L Third Toe amputation  - Home pregabalin  100 mg daily   LOS: 1 day   Koleen Vernell BRAVO, Medical Student 03/31/2024, 1:46 PM

## 2024-03-31 NOTE — Progress Notes (Signed)
 Mobility Specialist Progress Note:    03/31/24 1222  Mobility  Activity Ambulated with assistance in hallway  Level of Assistance Contact guard assist, steadying assist  Assistive Device Cane  Distance Ambulated (ft) 70 ft  Activity Response Tolerated well  Mobility Referral Yes  Mobility visit 1 Mobility  Mobility Specialist Start Time (ACUTE ONLY) 1141  Mobility Specialist Stop Time (ACUTE ONLY) 1155  Mobility Specialist Time Calculation (min) (ACUTE ONLY) 14 min   Received in bed and agreeable to mobility. C/o chest pain and SOB, SPO2 92% on RA. During session pt c/o dizziness took BP when returned to room. BP at EOB 122/67 (85). Assisted pt laying down. SPO2 95% on RA. Personal belongings and call light within reach. All needs met.  Lavanda Pollack Mobility Specialist  Please contact via [employer] or  Rehab Office [phone]

## 2024-04-01 ENCOUNTER — Other Ambulatory Visit (HOSPITAL_COMMUNITY): Payer: Self-pay

## 2024-04-01 DIAGNOSIS — R0789 Other chest pain: Secondary | ICD-10-CM

## 2024-04-01 LAB — BASIC METABOLIC PANEL WITH GFR
Anion gap: 8 (ref 5–15)
BUN: 21 mg/dL (ref 8–23)
CO2: 24 mmol/L (ref 22–32)
Calcium: 8.9 mg/dL (ref 8.9–10.3)
Chloride: 103 mmol/L (ref 98–111)
Creatinine, Ser: 0.82 mg/dL (ref 0.61–1.24)
GFR, Estimated: >60 mL/min (ref >60)
Glucose, Bld: 224 mg/dL — ABNORMAL HIGH (ref 70–99)
Potassium: 4.1 mmol/L (ref 3.5–5.1)
Sodium: 135 mmol/L (ref 135–145)

## 2024-04-01 LAB — CBC
HCT: 34.9 % — ABNORMAL LOW (ref 39.0–52.0)
Hemoglobin: 10.6 g/dL — ABNORMAL LOW (ref 13.0–17.0)
MCH: 23.4 pg — ABNORMAL LOW (ref 26.0–34.0)
MCHC: 30.4 g/dL (ref 30.0–36.0)
MCV: 77 fL — ABNORMAL LOW (ref 80.0–100.0)
Platelets: 263 10*3/uL (ref 150–400)
RBC: 4.53 MIL/uL (ref 4.22–5.81)
RDW: 22.5 % — ABNORMAL HIGH (ref 11.5–15.5)
WBC: 7.4 10*3/uL (ref 4.0–10.5)
nRBC: 0 % (ref 0.0–0.2)

## 2024-04-01 LAB — GLUCOSE, CAPILLARY
Glucose-Capillary: 183 mg/dL — ABNORMAL HIGH (ref 70–99)
Glucose-Capillary: 217 mg/dL — ABNORMAL HIGH (ref 70–99)

## 2024-04-01 MED ORDER — AZITHROMYCIN 500 MG PO TABS
500.0000 mg | ORAL_TABLET | Freq: Every day | ORAL | 0 refills | Status: DC
  Filled 2024-04-01: qty 1, 1d supply, fill #0

## 2024-04-01 MED ORDER — PREDNISONE 20 MG PO TABS
40.0000 mg | ORAL_TABLET | Freq: Every day | ORAL | 0 refills | Status: DC
  Filled 2024-04-01: qty 4, 2d supply, fill #0

## 2024-04-01 MED ORDER — METHOCARBAMOL 500 MG PO TABS
500.0000 mg | ORAL_TABLET | Freq: Four times a day (QID) | ORAL | 0 refills | Status: DC
  Filled 2024-04-01: qty 5, 2d supply, fill #0

## 2024-04-01 MED ORDER — IBUPROFEN 600 MG PO TABS
600.0000 mg | ORAL_TABLET | Freq: Three times a day (TID) | ORAL | 0 refills | Status: DC
  Filled 2024-04-01: qty 30, 10d supply, fill #0

## 2024-04-01 NOTE — Progress Notes (Signed)
 Physical Therapy Treatment Patient Details Name: Richard [name] MRN: [id number] DOB: 06/29/1961 Today's Date: 04/01/2024   History of Present Illness Pt is a 63 y/o male admitted 6/22 with worsening SOB. He was on bipap, then on 2L with SpO2 at 100%.  He has reproducible CP. PMH includes COPD, asthma, HTN, HLD, CAD, T2DM, recent L transmetatarsal amputation (09/18/23).    PT Comments  Pt received in supine, pt agreeable to therapy session and with good participation and improving activity tolerance, good stability using quad cane. Pt needing Supervision at most to ascend/descend 7 step x10 reps with bil railing support and no buckling or LOB. DOE 2/4 with exertion, improves with seated break. Discussion on energy conservation/activity pacing, discussion on pt obtaining a pulse oximeter for self-monitoring of HR with exertion. Anticipate pt safe to DC home once medically cleared without need for post-acute PT.    If plan is discharge home, recommend the following:  (PRN assist)   Can travel by private vehicle        Equipment Recommendations  None recommended by PT    Recommendations for Other Services       Precautions / Restrictions Precautions Precautions: Fall Recall of Precautions/Restrictions: Intact Restrictions Weight Bearing Restrictions Per Provider Order: No     Mobility  Bed Mobility Overal bed mobility: Modified Independent             General bed mobility comments: HOB elevated per pt preference for his breathing, no assist needed    Transfers Overall transfer level: Needs assistance Equipment used: Quad cane Transfers: Sit to/from Stand Sit to Stand: Modified independent (Device/Increase time)           General transfer comment: to/from EOB    Ambulation/Gait Ambulation/Gait assistance: Supervision Gait Distance (Feet): 200 Feet Assistive device: Quad cane Gait Pattern/deviations: Step-through pattern Gait velocity: variable, standing breaks      General Gait Details: safe use of the quad cane using LUE to hold cane (tried RUE but pt reports not comfortable) SpO2 97% and above on RA, HR 100-109 bpm, dyspnea 2/4. Reviewed activity pacing/energy conservation   Stairs Stairs: Yes Stairs assistance: Supervision Stair Management: Two rails, Step to pattern, Forwards, Backwards Number of Stairs: 10 (single 7 step x10 reps) General stair comments: RW to simulate bil rails; reviewed sequencing pattern for reduced pain/safety, pt prefers up with LLE and down with RLE 2/2 chronic R knee pain; no buckling or LOB, DOE 1/4, only needed brief standing breaks.   Wheelchair Mobility     Tilt Bed    Modified Rankin (Stroke Patients Only)       Balance Overall balance assessment: Needs assistance Sitting-balance support: Single extremity supported, Feet supported Sitting balance-Leahy Scale: Good Sitting balance - Comments: no UE's needed   Standing balance support: Single extremity supported, No upper extremity supported, During functional activity Standing balance-Leahy Scale: Fair Standing balance comment: Good with QC, Fair with no AD                            Communication Communication Communication: No apparent difficulties  Cognition Arousal: Alert Behavior During Therapy: WFL for tasks assessed/performed   PT - Cognitive impairments: No apparent impairments                         Following commands: Intact      Cueing Cueing Techniques: Verbal cues  Exercises      General  Comments General comments (skin integrity, edema, etc.): Reviewed use of pulse oximeter for biofeedback and activity pacing, pt agreeable he may benefit and plans to purchase one.      Pertinent Vitals/Pain Pain Assessment Pain Assessment: Faces Faces Pain Scale: Hurts a little bit Pain Location: R knee with standing activity (chronic per pt) Pain Descriptors / Indicators: Discomfort, Guarding Pain  Intervention(s): Monitored during session    Home Living                          Prior Function            PT Goals (current goals can now be found in the care plan section) Acute Rehab PT Goals Patient Stated Goal: safely Independent at home, back to my school and family activity. PT Goal Formulation: With patient Time For Goal Achievement: 04/13/24 Progress towards PT goals: Progressing toward goals    Frequency    Min 3X/week      PT Plan      Co-evaluation              AM-PAC PT 6 Clicks Mobility   Outcome Measure  Help needed turning from your back to your side while in a flat bed without using bedrails?: None Help needed moving from lying on your back to sitting on the side of a flat bed without using bedrails?: None Help needed moving to and from a bed to a chair (including a wheelchair)?: None Help needed standing up from a chair using your arms (e.g., wheelchair or bedside chair)?: None Help needed to walk in hospital room?: A Little Help needed climbing 3-5 steps with a railing? : A Little 6 Click Score: 22    End of Session Equipment Utilized During Treatment: Gait belt Activity Tolerance: Patient tolerated treatment well Patient left: in bed;with call bell/phone within reach (pt sitting EOB) Nurse Communication: Mobility status PT Visit Diagnosis: Unsteadiness on feet (R26.81);Difficulty in walking, not elsewhere classified (R26.2)     Time: 9043-8986 PT Time Calculation (min) (ACUTE ONLY): 17 min  Charges:    $Gait Training: 8-22 mins PT General Charges $$ ACUTE PT VISIT: 1 Visit                     Island Dohmen P., PTA Acute Rehabilitation Services Secure Chat Preferred 9a-5:30pm Office: [phone]    Connell HERO [facility] 04/01/2024, 10:24 AM

## 2024-04-01 NOTE — TOC Transition Note (Signed)
 Transition of Care [facility]) - Discharge Note Rayfield Gobble RN, BSN Transitions of Care Unit 4E- RN Case Manager See Treatment Team for direct phone #   Patient Details  Name: Richard [name] MRN: [id number] Date of Birth: 01-29-1961  Transition of Care [facility]) CM/SW Contact:  Gobble Rayfield Hurst, RN Phone Number: 04/01/2024, 11:49 AM   Clinical Narrative:    Pt stable for transition home today-  Patient is scheduled for discharge today.  Readmission Prevention Assessment done.  Hospital f/u and discharge instructions on AVS.  PCP- [facility] Insurance coverage reviewed- [employer] Medicare/dual complete Transportation- family Medication barriers- none No TOC needs or recommendations noted.   Final next level of care: Home/Self Care Barriers to Discharge: No Barriers Identified   Patient Goals and CMS Choice Patient states their goals for this hospitalization and ongoing recovery are:: return home   Choice offered to / list presented to : NA      Discharge Placement                 home      Discharge Plan and Services Additional resources added to the After Visit Summary for   In-house Referral: NA Discharge Planning Services: NA Post Acute Care Choice: NA          DME Arranged: N/A DME Agency: NA       HH Arranged: NA HH Agency: NA        Social Drivers of Health (SDOH) Interventions SDOH Screenings   Food Insecurity: No Food Insecurity (03/30/2024)  Housing: Unknown (03/30/2024)  Transportation Needs: No Transportation Needs (03/30/2024)  Utilities: Not At Risk (03/30/2024)  Alcohol Screen: Low Risk  (09/25/2023)  Depression (PHQ2-9): Low Risk  (03/20/2024)  Financial Resource Strain: Low Risk  (09/25/2023)  Physical Activity: Sufficiently Active (09/25/2023)  Social Connections: Moderately Isolated (09/25/2023)  Stress: No Stress Concern Present (09/25/2023)  Tobacco Use: Medium Risk (03/29/2024)  Health Literacy: Adequate Health Literacy (09/25/2023)      Readmission Risk Interventions    04/01/2024   11:49 AM 09/06/2023   10:25 AM  Readmission Risk Prevention Plan  Post Dischage Appt  Complete  Medication Screening  Complete  Transportation Screening Complete Complete  Home Care Screening Complete   Medication Review (RN CM) Complete

## 2024-04-01 NOTE — Inpatient Diabetes Management (Signed)
 Inpatient Diabetes Program Recommendations  AACE/ADA: New Consensus Statement on Inpatient Glycemic Control (2015)  Target Ranges:  Prepandial:   less than 140 mg/dL      Peak postprandial:   less than 180 mg/dL (1-2 hours)      Critically ill patients:  140 - 180 mg/dL   Lab Results  Component Value Date   GLUCAP 183 (H) 04/01/2024   HGBA1C 7.6 (A) 02/14/2024    Latest Reference Range & Units 03/31/24 11:42 03/31/24 17:05 03/31/24 21:20 04/01/24 06:06  Glucose-Capillary 70 - 99 mg/dL 833 (H) 532 (H) 658 (H) 183 (H)  (H): Data is abnormally high Review of Glycemic Control  Diabetes history: DM2 Outpatient Diabetes medications: Tresiba  20 units daily, Actos  30 mg daily, synjardy  12.02-999 mg BID, Trulicity  4.5 mg weekly Current orders for Inpatient glycemic control: Semlgee 20 units at HS, Novolog  0-15 units correction scale TID, Novolog  0-5 units HS scale   Inpatient Diabetes Program Recommendations:   Noted that postprandial CBGs have been greater than 200 mg/dl.  Recommend adding Novolog  5 units TID with meals if eating at least 50% of meal and while on steroids. Titrate dosages as needed.   Marjorie Lunger RN BSN CDE Diabetes Coordinator Pager: [phone]  8am-5pm

## 2024-04-01 NOTE — Discharge Instructions (Signed)
 It was a pleasure taking care of your at [facility]! You were admitted for a COPD/Asthma exacerbation. You were treated with steroids (prednisone ), antibiotics (azithromycin ), and several nebulizers. Your breathing improved by discharge, so you can continue taking your Breztri  inhaler and albuterol  inhaler at home. You also experienced chest/rib pain from increased work of breathing. This was treated with multiple medications, but the muscle relaxer (Robaxin ) worked the best. We sent you a 5-day supply of Robaxin  to help with your chest/rib pain while you continue to recover. Please follow-up with pulmonology on 6/26, so they can discuss your hospitalization and make any medication adjustments needed.

## 2024-04-02 ENCOUNTER — Telehealth: Payer: Self-pay

## 2024-04-02 ENCOUNTER — Ambulatory Visit: Admitting: Podiatry

## 2024-04-02 ENCOUNTER — Other Ambulatory Visit: Payer: Self-pay

## 2024-04-02 ENCOUNTER — Other Ambulatory Visit: Payer: Self-pay | Admitting: Student

## 2024-04-02 ENCOUNTER — Ambulatory Visit: Admitting: Pulmonary Disease

## 2024-04-02 ENCOUNTER — Other Ambulatory Visit (HOSPITAL_COMMUNITY): Payer: Self-pay

## 2024-04-02 ENCOUNTER — Encounter: Payer: Self-pay | Admitting: Podiatry

## 2024-04-02 VITALS — BP 136/86 | HR 85 | Ht 75.0 in | Wt 229.0 lb

## 2024-04-02 DIAGNOSIS — L84 Corns and callosities: Secondary | ICD-10-CM | POA: Diagnosis not present

## 2024-04-02 DIAGNOSIS — J4551 Severe persistent asthma with (acute) exacerbation: Secondary | ICD-10-CM | POA: Diagnosis not present

## 2024-04-02 DIAGNOSIS — E1140 Type 2 diabetes mellitus with diabetic neuropathy, unspecified: Secondary | ICD-10-CM | POA: Diagnosis not present

## 2024-04-02 DIAGNOSIS — Z87891 Personal history of nicotine dependence: Secondary | ICD-10-CM | POA: Diagnosis not present

## 2024-04-02 DIAGNOSIS — J4489 Other specified chronic obstructive pulmonary disease: Secondary | ICD-10-CM | POA: Diagnosis not present

## 2024-04-02 MED ORDER — PREDNISONE 10 MG PO TABS: 60.0000 mg | ORAL_TABLET | Freq: Every day | ORAL | 0 refills | Status: DC

## 2024-04-02 NOTE — Telephone Encounter (Signed)
 Medication last refilled 03/05/24

## 2024-04-02 NOTE — Transitions of Care (Post Inpatient/ED Visit) (Signed)
 04/02/2024  Name: Richard [name] MRN: [id number] DOB: 07/17/1961  Today's TOC FU Call Status: Today's TOC FU Call Status:: Successful TOC FU Call Completed TOC FU Call Complete Date: 04/02/24 Patient's Name and Date of Birth confirmed.  Transition Care Management Follow-up Telephone Call Date of Discharge: 04/01/24 Discharge Facility: [facility]) Type of Discharge: Inpatient Admission Primary Inpatient Discharge Diagnosis:: COPD with acute exacerbation How have you been since you were released from the hospital?: Better  Items Reviewed: Did you receive and understand the discharge instructions provided?: No Medications obtained,verified, and reconciled?: Yes (Medications Reviewed) Any new allergies since your discharge?: No Dietary orders reviewed?: Yes Type of Diet Ordered:: Low sodium heart healthy  Carb Modified Diet Do you have support at home?: Yes People in Home [RPT]: child(ren), adult Name of Support/Comfort Primary Source: Noell  Medications Reviewed Today: Medications Reviewed Today     Reviewed by Gage Treiber, RN (Case Manager) on 04/02/24 at 1251  Med List Status: <None>   Medication Order Taking? Sig Documenting Provider Last Dose Status Informant  Accu-Chek Softclix Lancets lancets [id number]  1 each by Other route See admin instructions. 1-2 times daily  Patient not taking: Reported on 04/02/2024   [provider]  Active Self, Pharmacy Records  albuterol  (PROVENTIL ) (2.5 MG/3ML) 0.083% nebulizer solution [id number] Yes Take 3 mLs (2.5 mg total) by nebulization 2 (two) times daily. Kandis Perkins, DO  Active Self, Pharmacy Records  albuterol  (VENTOLIN  HFA) 108 [phone] Base) MCG/ACT inhaler [id number] Yes Inhale 1-2 puffs into the lungs every 4 (four) hours as needed for wheezing or shortness of breath. Rosan Dayton [name], DO  Active Self, Pharmacy Records  aspirin  EC 81 MG EC tablet [id number] Yes Take 1 tablet (81 mg total) by mouth daily. Geofm Johana SAUNDERS, MD   Active Self, Pharmacy Records  atorvastatin  (LIPITOR) 40 MG tablet [id number] Yes Take 40 mg by mouth daily. [provider]  Active   azithromycin  (ZITHROMAX ) 500 MG tablet [id number] Yes Take 1 tablet (500 mg total) by mouth daily. Amoako, Prince, MD  Active   Budeson-Glycopyrrol-Formoterol  (BREZTRI  AEROSPHERE) 160-9-4.8 MCG/ACT TERESE [id number] Yes Inhale 2 puffs into the lungs in the morning and at bedtime. Gladis Leonor HERO, MD  Active Self, Pharmacy Records  cetirizine  (ZYRTEC  ALLERGY) 10 MG tablet [id number] Yes Take 1 tablet (10 mg total) by mouth daily. Kandis Perkins, DO  Active Self, Pharmacy Records  clopidogrel  (PLAVIX ) 75 MG tablet [id number] Yes Take 1 tablet (75 mg total) by mouth daily. Rosan Dayton [name], DO  Active Self, Pharmacy Records  Continuous Glucose Sensor (FREESTYLE LIBRE 3 [place]) [place] [id number] Yes Place 1 sensor on the skin every 14 days. Use to check glucose continuously. Rosan Dayton [name], DO  Active Self, Pharmacy Records  diphenhydrAMINE  (BENADRYL  ALLERGY) 25 mg capsule [id number] Yes Take 1 capsule (25 mg total) by mouth at bedtime as needed for allergies or itching. Kandis Perkins, DO  Active Self, Pharmacy Records  Dulaglutide  (TRULICITY ) 4.5 MG/0.5ML EMMANUEL [id number] Yes Inject 4.5 mg as directed once a week. Rosan Dayton [name], DO  Active Self, Pharmacy Records  DUPIXENT 300 MG/2ML EMMANUEL [id number] Yes Inject 300 mg into the skin every 14 (fourteen) days. [provider]  Active Self, Pharmacy Records  escitalopram  (LEXAPRO ) 5 MG tablet [id number] Yes Take 1 tablet (5 mg total) by mouth daily. Rosan Dayton [name], DO  Active Self, Pharmacy Records  ferrous sulfate  [phone] FE) MG tablet [id number] Yes Take 1 tablet (325 mg total) by mouth daily. Tobie,  Libby, DO  Active Self, Pharmacy Records  fluticasone  (FLONASE ) 50 MCG/ACT nasal spray [id number] Yes Place 1 spray into both nostrils as needed for allergies. Rosan Dayton [name], DO  Active Self, Pharmacy Records  glucose blood  test strip [id number]  1 each by Other route in the morning and at bedtime.  Patient not taking: Reported on 04/02/2024   [provider]  Active Self, Pharmacy Records  ibuprofen  (ADVIL ) 600 MG tablet [id number] Yes Take 1 tablet (600 mg total) by mouth 3 (three) times daily. Amoako, Prince, MD  Active   insulin  degludec (TRESIBA ) 100 UNIT/ML FlexTouch Pen [id number] Yes Inject 20 Units into the skin daily. Rosan Dayton [name], DO  Active Self, Pharmacy Records  Insulin  Pen Needle (PEN NEEDLES) 32G X 4 MM MISC [id number] Yes Use 1 each daily as directed. Rosan Dayton [name], DO  Active Self, Pharmacy Records  ipratropium-albuterol  (DUONEB) 0.5-2.5 (3) MG/3ML SOLN [id number] Yes Use 1 vial (3 mLs) by nebulization every 6 (six) hours as needed. Rosan Dayton [name], DO  Active Self, Pharmacy Records  Mepolizumab  (NUCALA ) 100 MG/ML EMMANUEL [id number]  Inject 1 mL (100 mg total) into the skin every 28 (twenty-eight) days.  Patient not taking: Reported on 04/02/2024   Francella Rogue, MD  Active Self, Pharmacy Records           Med Note [facility], DONETA GORMAN Repress Mar 29, 2024 11:48 PM) Patient said that he hasn't taken this in a while, but unsure if it's d/t insurance or if it's been discontinued.  methocarbamol  (ROBAXIN ) 500 MG tablet [id number] Yes Take 1 tablet (500 mg total) by mouth every 6 (six) hours. Amoako, Prince, MD  Active   montelukast  (SINGULAIR ) 10 MG tablet [id number] Yes Take 1 tablet (10 mg total) by mouth at bedtime. Rosan Dayton [name], DO  Active Self, Pharmacy Records  pioglitazone  (ACTOS ) 30 MG tablet [id number]  Take 30 mg by mouth daily.  Patient not taking: Reported on 04/02/2024   [provider]  Active Self, Pharmacy Records  predniSONE  (DELTASONE ) 10 MG tablet [id number] Yes Take 6 tablets (60 mg total) by mouth daily with breakfast. Take 60 mg/day on day 1 and reduce dose by 10 mg every 2 days until taper is complete Mannam, Praveen, MD  Active   pregabalin  (LYRICA ) 100 MG capsule [id number] Yes  Take 1 capsule (100 mg total) by mouth 3 (three) times daily. Rosan Dayton [name], DO  Active Self, Pharmacy Records  SYNJARDY  12.02-999 MG TABS [id number] Yes SMARTSIG:1 Tablet(s) By Mouth Morning-Night [provider]  Active Self, Pharmacy Records  tadalafil  (CIALIS ) 10 MG tablet [id number] Yes Take 1 tablet (10 mg total) by mouth as needed for erectile dysfunction. Rosan Dayton [name], DO  Active Self, Pharmacy Records  triamcinolone  cream (KENALOG ) 0.1 % [id number] Yes Apply 1 Application topically 2 (two) times daily. Kandis Perkins, DO  Active Self, Pharmacy Records            Home Care and Equipment/Supplies: Were Home Health Services Ordered?: No Any new equipment or medical supplies ordered?: No  Functional Questionnaire: Do you need assistance with bathing/showering or dressing?: No Do you need assistance with meal preparation?: No Do you need assistance with eating?: No Do you have difficulty maintaining continence: No Do you need assistance with getting out of bed/getting out of a chair/moving?: No Do you have difficulty managing or taking your medications?: No  Follow up appointments reviewed: PCP Follow-up appointment confirmed?: Yes Date of PCP follow-up appointment?: 04/14/24 Follow-up  Provider: [facility] Internal Medicine Specialist Hospital Follow-up appointment confirmed?: Yes Date of Specialist follow-up appointment?: 04/02/24 Follow-Up Specialty Provider:: Pulmonary Do you need transportation to your follow-up appointment?: No Do you understand care options if your condition(s) worsen?: Yes-patient verbalized understanding  SDOH Interventions Today    Flowsheet Row Most Recent Value  SDOH Interventions   Food Insecurity Interventions Intervention Not Indicated  Housing Interventions Intervention Not Indicated  Transportation Interventions Intervention Not Indicated  Utilities Interventions Intervention Not Indicated    Goals Addressed              This Visit's Progress    VBCI Transitions of Care (TOC) Care Plan       Problems:  Recent Hospitalization for treatment of COPD Knowledge Deficit Related to COPD/Asthma  Goal:  Over the next 30 days, the patient will not experience hospital readmission  Interventions:  Transitions of Care: Doctor Visits  - discussed the importance of doctor visits Reviewed Signs and symptoms of infection  COPD Interventions: Advised patient to track and manage COPD triggers Advised patient to self assesses COPD action plan zone and make appointment with provider if in the yellow zone for 48 hours without improvement Assessed social determinant of health barriers Discussed the importance of adequate rest and management of fatigue with COPD Provided education about and advised patient to utilize infection prevention strategies to reduce risk of respiratory infection Provided instruction about proper use of medications used for management of COPD including inhalers Encouraged patient to pace self with activity. Reviewed importance of medication compliance. Reviewed when to call for physician for increased shortness of breath, increased sputum, fatigue that is not normal, cough, fever or sick contacts.    Diabetes Interventions: Assessed patient's understanding of A1c goal: <7% Provided education to patient about basic DM disease process Reviewed with patient diabetes numbers.  Recently higher close to 200 due to prednisone  use.  Patient reports it runs around 125-130 when not on steroids.   Lab Results  Component Value Date   HGBA1C 7.6 (A) 02/14/2024    Patient Self Care Activities:  Call pharmacy for medication refills 3-7 days in advance of running out of medications Call provider office for new concerns or questions  Notify RN Care Manager of TOC call rescheduling needs Participate in Transition of Care Program/Attend TOC scheduled calls Take medications as prescribed   check blood sugar at  prescribed times: Uses Libre monitor limit fast food meals to no more than 1 per week manage portion size keep feet up while sitting wash and dry feet carefully every day wear comfortable, cotton socks wear comfortable, well-fitting shoes  Plan:  Telephone follow up appointment with care management team member scheduled for:  04/09/24 with primary nurse Bing Edison.    Patient reports doing much better from hospitalization.  Pulmonary appointment this morning.  PCP follow up 7/8.  Patient reports some intermittent chest pain from coughing but feels it is better. Patient is independent with care and voices no problems with medications.          Gratia Disla J. Peyten Punches RN, MSN [facility], [facility] RN Care Manager Direct Dial: [phone]  Fax: [phone] Website: [email or web address]

## 2024-04-02 NOTE — Progress Notes (Signed)
 This patient presents to the office with chief complaint of long thick nails and diabetic feet.  This patient  says there  is  no pain and discomfort in his feet.  This patient says there are long thick painful nails.on his right foot.  These nails are painful walking and wearing shoes.  Patient is a diabetic with amputation left forefoot.  Patient is unable to  self treat his own nails . This patient presents  to the office today for treatment of the  long nails and a foot evaluation due to history of  diabetes.  General Appearance  Alert, conversant and in no acute stress.  Vascular  Dorsalis pedis and posterior tibial  pulses are palpable  right    Temperature is within normal limits  bilaterally.  Neurologic  Senn-Weinstein monofilament wire test within normal limits  bilaterally. Muscle power within normal limits bilaterally.  Nails Thick disfigured discolored nails with subungual debris  from hallux to fifth toes right foot.. No evidence of bacterial infection or drainage bilaterally.  Orthopedic  No limitations of motion of motion feet .  No crepitus or effusions noted.  No bony pathology or digital deformities noted. TMA left foot.  Skin  normotropic skin with noted bilaterally.  No signs of infections or ulcers noted.   Porokeratosis sub 2 right foot.  Onychomycosis  Diabetes with no foot complications  Debride nails right foot with nail nipper and dremel tool.   RTC  4 weeks    Cordella Bold DPM

## 2024-04-02 NOTE — Patient Instructions (Signed)
 VISIT SUMMARY:  Today, we discussed your recent hospitalization due to an asthma exacerbation, likely triggered by secondhand smoke and environmental factors. We reviewed your current medications and made some adjustments to help manage your symptoms. We also addressed your ongoing COPD, musculoskeletal chest pain, and the need to reschedule your sleep study for suspected sleep apnea. Additionally, we talked about your eczema treatment and the importance of continuing your current regimen.  YOUR PLAN:  -SEVERE PERSISTENT ASTHMA WITH EXACERBATION: Severe persistent asthma is a long-term condition where your airways are always inflamed and can become more swollen and narrow when something triggers your symptoms. We believe your recent asthma flare-up was due to secondhand smoke and environmental factors. Continue taking Breztri  twice daily, Singulair , and Dupixent 300 mg every 14 days. We are increasing your prednisone  to 60 mg daily to prevent a relapse. Avoid heat and humidity as much as possible. Schedule a follow-up appointment in one to two months.  -CHRONIC OBSTRUCTIVE PULMONARY DISEASE (COPD): COPD is a chronic lung disease that makes it hard to breathe. Continue with your current COPD management plan as there were no acute issues during this visit.  -MUSCULOSKELETAL CHEST PAIN: Musculoskeletal chest pain is pain that comes from the muscles or bones in your chest. This pain is likely related to your recent asthma exacerbation and hospitalization. Continue taking ibuprofen  to manage the pain.  -OBSTRUCTIVE SLEEP APNEA (OSA): OSA is a condition where your breathing stops and starts during sleep. You missed your previous sleep study due to hospitalization, so it is important to reschedule it. Contact Dr. Buck at [facility] to set up a new appointment.  -ECZEMA: Eczema is a condition that makes your skin red and itchy. You recently started Dupixent for eczema management. Continue with this  treatment as it needs more time to show its effectiveness.  INSTRUCTIONS:  Please schedule a follow-up appointment in one to two months. Also, reschedule your consult with Dr. Buck at [facility] to evaluate for sleep apnea.

## 2024-04-02 NOTE — Progress Notes (Signed)
 Richard [name]    [id number]    03/30/1961  Primary Care Physician:Hoffman, Dayton [name], DO  Referring Physician: Rosan Dayton [name], DO [address], [address] [place],  [place] [address]  Chief complaint:  Follow-up for severe persistent asthma, COPD Previously on Nucala .  Started Dupixent in May 2025 Needs preop clearance for knee surgery  HPI: 63 y.o. 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  Discussed the use of AI scribe software for clinical note transcription with the patient, who gave verbal consent to proceed.  History of Present Illness Richard [name] is a 63 year old male with severe persistent asthma, COPD, and diabetes.   He has severe persistent asthma and COPD. He uses an albuterol  inhaler and solution, and Breztri  twice daily. He was previously on Nucala  but changed to Dupixent. He started Dupixent in May 2025 for eczema, which is affecting his back and chest Dupixent as prescribed by his dermatologist. He experiences increased shortness of breath today due to humidity, with chest tightness and wheezing. He is also on Singulair . He is allergic to pollen, which exacerbates his asthma symptoms.\   Interim History Richard [name] is a 63 year old male with severe persistent asthma and COPD who presents with  recent hospitalization for asthma exacerbation.  Discharged on 04/01/2024  He recently experienced an asthma exacerbation that led to hospitalization, beginning with chest tightness and pain, shortness of breath, and gasping for air. He was exposed to secondhand smoke while traveling in [place], [place] , which he believes contributed to his exacerbation. He was discharged from the hospital after his condition improved, but notes that today is 'not such a good day.'  During his hospital stay, he was initially placed on BiPAP and then weaned to oxygen . He was discharged with prednisone , azithromycin , and nebulizers (Duoneb). He is currently taking prednisone  40 mg daily for four more days. He also has chest pain radiating to his right arm and back, which has improved since hospitalization but persists. He was given ibuprofen  for this pain. His chest x-ray and cardiac workup, including troponins and EKG, were unremarkable.  He is on Breztri  twice daily, Singulair , and recently started Dupixent approximately a month ago, with a dose of 300 mg every two weeks. He was previously on Nucala , which was switched to Dupixent by a dermatologist for eczema. He has not noticed significant changes with Dupixent yet, likely due to the recent start and his recent travel and hospitalization.  Heat and humidity exacerbate his symptoms, causing chest tightness and difficulty breathing, especially with physical exertion such as walking, climbing stairs, or having long conversations. He is not currently on oxygen  at night and his oxygen  levels are generally good, but he sometimes feels it is hard to get air to his lungs.  He was scheduled for a sleep study for suspected OSA or REM sleep disorder, but it was missed due to his hospitalization. He plans to reschedule this appointment. No acid reflux.  He mentions  planning a vacation to [facility] for his upcoming birthday and is focused on managing his health to be  able to travel.  Pets: No pets Occupation: On disability.  Used to work as a Estate agent Exposures: No mold, hot tub, Financial controller.  No feather pillows or comforters No h/o chemo/XRT/amiodarone/macrodantin/MTX  No exposure to asbestos, silica or other organic allergens  Smoking history: 45-pack-year smoker.  Quit in 2023 Travel history: Originally from [place] .  No significant recent travel Relevant family history: No family history of lung disease  Outpatient Encounter Medications as of 04/02/2024  Medication Sig   Accu-Chek Softclix Lancets lancets 1 each by Other route See admin instructions. 1-2 times daily   albuterol  (PROVENTIL ) (2.5 MG/3ML) 0.083% nebulizer solution Take 3 mLs (2.5 mg total) by nebulization 2 (two) times daily.   albuterol  (VENTOLIN  HFA) 108 (90 Base) MCG/ACT inhaler Inhale 1-2 puffs into the lungs every 4 (four) hours as needed for wheezing or shortness of breath.   aspirin  EC 81 MG EC tablet Take 1 tablet (81 mg total) by mouth daily.   atorvastatin  (LIPITOR) 40 MG tablet Take 40 mg by mouth daily.   azithromycin  (ZITHROMAX ) 500 MG tablet Take 1 tablet (500 mg total) by mouth daily.   Budeson-Glycopyrrol-Formoterol  (BREZTRI  AEROSPHERE) 160-9-4.8 MCG/ACT AERO Inhale 2 puffs into the lungs in the morning and at bedtime.   cetirizine  (ZYRTEC  ALLERGY) 10 MG tablet Take 1 tablet (10 mg total) by mouth daily.   clopidogrel  (PLAVIX ) 75 MG tablet Take 1 tablet (75 mg total) by mouth daily.   Continuous Glucose Sensor (FREESTYLE LIBRE 3 SENSOR) MISC Place 1 sensor on the skin every 14 days. Use to check glucose continuously.   diphenhydrAMINE  (BENADRYL  ALLERGY) 25 mg capsule Take 1 capsule (25 mg total) by mouth at bedtime as needed for allergies or itching.   Dulaglutide  (TRULICITY ) 4.5 MG/0.5ML SOAJ Inject 4.5 mg as directed once a week.   DUPIXENT 300 MG/2ML SOAJ Inject 300 mg into the skin every 14 (fourteen) days.   escitalopram  (LEXAPRO ) 5 MG tablet Take 1 tablet (5  mg total) by mouth daily.   ferrous sulfate  325 (65 FE) MG tablet Take 1 tablet (325 mg total) by mouth daily.   fluticasone  (FLONASE ) 50 MCG/ACT nasal spray Place 1 spray into both nostrils as needed for allergies.   glucose blood test strip 1 each by Other route in the morning and at bedtime.   ibuprofen  (ADVIL ) 600 MG tablet Take 1 tablet (600 mg total) by mouth 3 (three) times daily.   insulin  degludec (TRESIBA ) 100 UNIT/ML FlexTouch Pen Inject 20 Units into the skin daily.   Insulin  Pen Needle (PEN NEEDLES) 32G X 4 MM MISC Use 1 each daily as directed.   ipratropium-albuterol  (DUONEB) 0.5-2.5 (3) MG/3ML SOLN Use 1 vial (3 mLs) by nebulization every 6 (six) hours as needed.   methocarbamol  (ROBAXIN ) 500 MG tablet Take 1 tablet (500 mg total) by mouth every 6 (six) hours.   montelukast  (SINGULAIR ) 10 MG tablet Take 1 tablet (10 mg total) by mouth at bedtime.   pioglitazone  (ACTOS ) 30 MG tablet Take 30 mg by mouth daily.   predniSONE  (DELTASONE ) 20 MG tablet Take 2 tablets (40 mg total) by mouth daily with breakfast.   pregabalin  (LYRICA ) 100 MG capsule Take 1 capsule (100 mg total) by mouth 3 (three) times daily.   SYNJARDY  12.02-999 MG TABS SMARTSIG:1 Tablet(s) By Mouth Morning-Night   tadalafil  (CIALIS ) 10 MG tablet Take 1 tablet (10 mg total)  by mouth as needed for erectile dysfunction.   triamcinolone  cream (KENALOG ) 0.1 % Apply 1 Application topically 2 (two) times daily.   Mepolizumab  (NUCALA ) 100 MG/ML SOAJ Inject 1 mL (100 mg total) into the skin every 28 (twenty-eight) days. (Patient not taking: Reported on 04/02/2024)   [DISCONTINUED] acetaminophen  (TYLENOL ) tablet 650 mg    [DISCONTINUED] arformoterol  (BROVANA ) nebulizer solution 15 mcg    [DISCONTINUED] aspirin  EC tablet 81 mg    [DISCONTINUED] atorvastatin  (LIPITOR) tablet 40 mg    [DISCONTINUED] azithromycin  (ZITHROMAX ) tablet 500 mg    [DISCONTINUED] budesonide  (PULMICORT ) nebulizer solution 0.25 mg    [DISCONTINUED]  clopidogrel  (PLAVIX ) tablet 75 mg    [DISCONTINUED] enoxaparin  (LOVENOX ) injection 40 mg    [DISCONTINUED] escitalopram  (LEXAPRO ) tablet 5 mg    [DISCONTINUED] ferrous sulfate  tablet 325 mg    [DISCONTINUED] guaiFENesin  (MUCINEX ) 12 hr tablet 600 mg    [DISCONTINUED] ibuprofen  (ADVIL ) tablet 600 mg    [DISCONTINUED] insulin  aspart (novoLOG ) injection 0-15 Units    [DISCONTINUED] insulin  aspart (novoLOG ) injection 0-5 Units    [DISCONTINUED] insulin  glargine-yfgn (SEMGLEE ) injection 20 Units    [DISCONTINUED] ipratropium-albuterol  (DUONEB) 0.5-2.5 (3) MG/3ML nebulizer solution 3 mL    [DISCONTINUED] lidocaine  (LIDODERM ) 5 % 1 patch    [DISCONTINUED] melatonin tablet 10 mg    [DISCONTINUED] methocarbamol  (ROBAXIN ) tablet 500 mg    [DISCONTINUED] predniSONE  (DELTASONE ) tablet 40 mg    [DISCONTINUED] pregabalin  (LYRICA ) capsule 100 mg    [DISCONTINUED] revefenacin  (YUPELRI ) nebulizer solution 175 mcg    No facility-administered encounter medications on file as of 04/02/2024.    Physical Exam: Blood pressure 136/86, pulse 85, height 6' 3 (1.905 m), weight 229 lb (103.9 kg), SpO2 100%. Gen:      No acute distress HEENT:  EOMI, sclera anicteric Neck:     No masses; no thyromegaly Lungs:    Clear to auscultation bilaterally; normal respiratory effort CV:         Regular rate and rhythm; no murmurs Abd:      + bowel sounds; soft, non-tender; no palpable masses, no distension Ext:    No edema; adequate peripheral perfusion Neuro: alert and oriented x 3 Psych: normal mood and affect   Data Reviewed: Imaging: CTA 02/10/2023 No evidence of pulmonary embolism, emphysema, no acute parenchymal lung abnormality  Chest x-ray 03/29/2024-no active cardiopulmonary disease I have reviewed the images personally.  PFTs: 12/07/2022 FVC 3.37 [64%], FEV1 2.48 [63%],/F73, TLC 6.35 [83%], DLCO 20.08 [67%] Air trapping, bronchodilator response suggestive of asthma.  Mild diffusion impairment  Labs: CBC  02/14/2024-WBC 4.9, eos 10%, absolute eosinophil count 419 RAST panel 02/21/2018-IgE 886, multiple allergies noted to dog, dust mite, mold, pollen Assessment & Plan Severe persistent asthma, COPD with exacerbation Recent hospitalization due to asthma, COPD exacerbation, likely triggered by exposure to secondhand smoke and environmental factors such as heat and humidity. Currently on Breztri , Singulair , and Dupixent. Dupixent was started approximately one month ago, replacing Nucala , and requires more time to assess efficacy. Currently on prednisone  40 mg daily, with a plan to increase to 60 mg to prevent relapse. No wheezing during examination, but experiences shortness of breath with exertion. - Continue Breztri  twice daily - Continue Singulair  - Continue Dupixent 300 mg every 14 days.  Managed by dermatology who started it for severe atopic eczema - Increase prednisone  to 60 mg daily with taper over 12 days - Advise to avoid heat and humidity - Schedule follow-up in one to two months with APP to assess response  Musculoskeletal chest pain Chest pain likely musculoskeletal in origin, associated with recent asthma exacerbation and hospitalization. Cardiac causes ruled out with negative troponins and normal EKG during recent admission. Pain is less severe than during hospitalization but persists. - Continue OTC meds for pain management  Obstructive sleep apnea (OSA) Previous sleep study and sleep consult with Dr. Buck, [facility] neurology was missed due to hospitalization. Requires rescheduling for proper evaluation and management, especially before undergoing any elective procedures. Emphasized the importance of evaluating for sleep apnea before any surgery due to potential risks associated with untreated sleep apnea during anesthesia. - Provide contact information for scheduling sleep consult with Dr. Rome at [facility] - Advise to reschedule sleep study  Eczema Eczema management  includes Dupixent, which was recently started. Requires more time to evaluate effectiveness. - Continue Dupixent for eczema management  Preop evaluation for knee surgery and colonoscopy Suggest that we hold off on elective procedures until his asthma is under better control and he has been fully evaluated for sleep disorders and started on NIV as needed.  Return to clinic in 4 to 8 weeks to reevaluate  Follow-up Follow-up is necessary to evaluate asthma control and surgical readiness. A sleep study is pending to assess for potential sleep apnea, REM sleep disorder, which should be addressed before knee surgery. Clearance is contingent on better asthma control and evaluation of sleep issues. - Schedule follow-up appointment in 4 to 8 weeks to reassess asthma control and surgical readiness.  Recommendations: Continue Breztri , Singulair  Prednisone  taper starting at 60 mg Continue Dupixent.   Follow results of sleep consult  Reassess in 4-8 weeks  Lonna Coder MD [facility] 04/02/2024, 8:41 AM  CC: Rosan Dayton [name], DO

## 2024-04-02 NOTE — Patient Instructions (Signed)
 Visit Information  Thank you for taking time to visit with me today. Please don't hesitate to contact me if I can be of assistance to you before our next scheduled telephone appointment.  Our next appointment is by telephone on 04/09/24 at 1130 am  Following is a copy of your care plan:   Goals Addressed             This Visit's Progress    VBCI Transitions of Care (TOC) Care Plan       Problems:  Recent Hospitalization for treatment of COPD Knowledge Deficit Related to COPD/Asthma  Goal:  Over the next 30 days, the patient will not experience hospital readmission  Interventions:  Transitions of Care: Doctor Visits  - discussed the importance of doctor visits Reviewed Signs and symptoms of infection  COPD Interventions: Advised patient to track and manage COPD triggers Advised patient to self assesses COPD action plan zone and make appointment with provider if in the yellow zone for 48 hours without improvement Assessed social determinant of health barriers Discussed the importance of adequate rest and management of fatigue with COPD Provided education about and advised patient to utilize infection prevention strategies to reduce risk of respiratory infection Provided instruction about proper use of medications used for management of COPD including inhalers Encouraged patient to pace self with activity. Reviewed importance of medication compliance. Reviewed when to call for physician for increased shortness of breath, increased sputum, fatigue that is not normal, cough, fever or sick contacts.    Diabetes Interventions: Assessed patient's understanding of A1c goal: <7% Provided education to patient about basic DM disease process Reviewed with patient diabetes numbers.  Recently higher close to 200 due to prednisone  use.  Patient reports it runs around 125-130 when not on steroids.   Lab Results  Component Value Date   HGBA1C 7.6 (A) 02/14/2024    Patient Self Care  Activities:  Call pharmacy for medication refills 3-7 days in advance of running out of medications Call provider office for new concerns or questions  Notify RN Care Manager of TOC call rescheduling needs Participate in Transition of Care Program/Attend TOC scheduled calls Take medications as prescribed   check blood sugar at prescribed times: Uses Libre monitor limit fast food meals to no more than 1 per week manage portion size keep feet up while sitting wash and dry feet carefully every day wear comfortable, cotton socks wear comfortable, well-fitting shoes  Plan:  Telephone follow up appointment with care management team member scheduled for:  04/09/24 with primary nurse Bing Edison.    Patient reports doing much better from hospitalization.  Pulmonary appointment this morning.  PCP follow up 7/8.  Patient reports some intermittent chest pain from coughing but feels it is better. Patient is independent with care and voices no problems with medications.          Patient verbalizes understanding of instructions and care plan provided today and agrees to view in MyChart. Active MyChart status and patient understanding of how to access instructions and care plan via MyChart confirmed with patient.     The patient has been provided with contact information for the care management team and has been advised to call with any health related questions or concerns.   Please call the care guide team at [phone] if you need to cancel or reschedule your appointment.   Please call the Suicide and Crisis Lifeline: 988 if you are experiencing a Mental Health or Behavioral Health Crisis or need  someone to talk to.   Jancarlo Biermann J. Kennie Snedden RN, MSN [facility], [facility] RN Care Manager Direct Dial: [phone]  Fax: [phone] Website: [email or web address]

## 2024-04-03 ENCOUNTER — Other Ambulatory Visit: Payer: Self-pay

## 2024-04-03 ENCOUNTER — Other Ambulatory Visit (HOSPITAL_COMMUNITY): Payer: Self-pay

## 2024-04-03 NOTE — Telephone Encounter (Signed)
 Per ov note with Mannam on 04/02/24-   Preop evaluation for knee surgery and colonoscopy Suggest that we hold off on elective procedures until his asthma is under better control and he has been fully evaluated for sleep disorders and started on NIV as needed.  Return to clinic in 4 to 8 weeks to reevaluate  Pt is set for appt 05/04/24- will hold in clearance pool until the visit is complete.

## 2024-04-04 ENCOUNTER — Other Ambulatory Visit (HOSPITAL_COMMUNITY): Payer: Self-pay

## 2024-04-06 ENCOUNTER — Other Ambulatory Visit: Payer: Self-pay

## 2024-04-09 ENCOUNTER — Telehealth: Payer: Self-pay

## 2024-04-10 NOTE — Progress Notes (Unsigned)
 [facility]   MULTIDISCIPLINARY HEART VALVE CLINIC                                     Cardiology Office Note:    Date:  04/13/2024   ID:  Richard [name], DOB 30-Jun-1961, MRN [id number]  PCP:  Rosan Dayton [name], DO  [facility] Cardiologist:  Ozell Fell, MD  [facility] Structural heart: Ozell Fell, MD [facility] Electrophysiologist:  None   Referring MD: Rosan Dayton [name], DO   Follow up valve disease  History of Present Illness:    Richard [name] is a 63 y.o. male with a hx of nonobstructive CAD, OSA, PAD s/p metatarsal amputations, HTN, HLD, DMT2, COPD/asthma with former tobacco abuse, chronic anemia on iron  infusions and aortic stenosis, mitral stenosis who presents to clinic for follow up.   Cardiac catheterization in 2017 showed mild nonobstructive CAD. Stress test in 2022 was low risk.  He has a history of aortic stenosis and mitral valve stenosis. Additionally, he has a history of PAD s/p amputation of the 4th and 5th toes in 2021, severe bilateral pedal disease on angiogram in February 2022, follows with vascular surgery.  He was last seen in the office on 10/18/2023 and was stable from a cardiac standpoint.  Repeat echocardiogram was recommended in 1 year for monitoring of valvular heart disease.  He was hospitalized in February 2025 in the setting of COPD exacerbation. Echocardiogram 11/29/23 showed EF 60 to 65%, normal LV function, no RWMA, mild asymmetric LVH of the basal septal segment, G1DD, normal RV systolic function, moderate mitral stenosis, moderate to severe MAC, mild aortic valve stenosis with mean gradient 12 mmHg.   He was last seen in our office 03/03/24 for pre op clearance prior to TKA. Given atypical chest pain a nuclear stress test was ordered and low risk. He was cleared for surgery. He was then admitted to [facility] 6/22-6/25/25 for AECOPD treated with BIPAP, nebs, steroids and abx. Later seen by pulm who suggested holding off on elective knee  replacement until lung disease better controlled.   Today the patient presents to clinic for follow up. Here alone. He was recently at the beach and feeling great. As soon as he returned, he started having more issues with SOB. Occasional fleeting chest pain. Not exertional. No CP or SOB. No LE edema, orthopnea or PND. No dizziness or syncope. No blood in stool or urine. Due to his knee and lung issues he feels he needs a break from [employer] and asked for a letter so he doesn't lose his financial aid.   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 1-2 times daily   albuterol  (PROVENTIL ) (2.5 MG/3ML) 0.083% nebulizer solution Take 3 mLs (2.5 mg total) by nebulization 2 (two) times daily.   albuterol  (VENTOLIN  HFA) 108 (90 Base) MCG/ACT inhaler Inhale 1-2 puffs into the lungs every 4 (four) hours as needed for wheezing or shortness of breath.   aspirin  EC 81 MG EC tablet Take 1 tablet (81 mg total) by mouth daily.    atorvastatin  (LIPITOR) 40 MG tablet Take 40 mg by mouth daily.   Budeson-Glycopyrrol-Formoterol  (BREZTRI  AEROSPHERE) 160-9-4.8 MCG/ACT AERO Inhale 2 puffs into the lungs in the morning and at bedtime.   cetirizine  (ZYRTEC  ALLERGY) 10 MG tablet Take 1 tablet (10 mg total) by mouth daily.   clopidogrel  (PLAVIX ) 75 MG tablet Take 1 tablet (75 mg total) by mouth daily.   Continuous Glucose Sensor (FREESTYLE LIBRE 3 SENSOR) MISC Place 1 sensor on the skin every 14 days. Use to check glucose continuously.   diphenhydrAMINE  (BENADRYL  ALLERGY) 25 mg capsule Take 1 capsule (25 mg total) by mouth at bedtime as needed for allergies or itching.   Dulaglutide  (TRULICITY ) 4.5 MG/0.5ML SOAJ Inject 4.5 mg as directed once a week.   DUPIXENT 300 MG/2ML SOAJ Inject 300 mg into the skin every 14 (fourteen) days.   escitalopram  (LEXAPRO ) 5 MG tablet Take 1 tablet (5 mg total) by mouth daily.   ferrous sulfate  325 (65 FE) MG tablet Take 1 tablet (325 mg total) by mouth daily.   fluticasone  (FLONASE ) 50 MCG/ACT nasal spray Place 1 spray into both nostrils as needed for allergies.   glucose blood test strip 1 each by Other route in the morning and at bedtime.   ibuprofen  (ADVIL ) 600 MG tablet Take 1 tablet (600 mg total) by mouth 3 (three) times daily.   insulin  degludec (TRESIBA ) 100 UNIT/ML FlexTouch Pen Inject 20 Units into the skin daily.   Insulin  Pen Needle (PEN NEEDLES) 32G X 4 MM MISC Use 1 each daily as directed.   ipratropium-albuterol  (DUONEB) 0.5-2.5 (3) MG/3ML SOLN Use 1 vial (3 mLs) by nebulization every 6 (six) hours as needed.   Mepolizumab  (NUCALA ) 100 MG/ML SOAJ Inject 1 mL (100 mg total) into the skin every 28 (twenty-eight) days.   methocarbamol  (ROBAXIN ) 500 MG tablet Take 1 tablet (500 mg total) by mouth every 6 (six) hours.   montelukast  (SINGULAIR ) 10 MG tablet Take 1 tablet (10 mg total) by mouth at bedtime.   pioglitazone  (ACTOS ) 30 MG tablet Take 30 mg by mouth daily.   predniSONE  (DELTASONE )  10 MG tablet Take 6 tablets (60 mg total) by mouth daily with breakfast. Take 60 mg/day on day 1 and reduce dose by 10 mg every 2 days until taper is complete   pregabalin  (LYRICA ) 100 MG capsule Take 1 capsule (100 mg total) by mouth 3 (three) times daily.   SYNJARDY  12.02-999 MG TABS SMARTSIG:1 Tablet(s) By Mouth Morning-Night   tadalafil  (CIALIS ) 10 MG tablet Take 1 tablet (10 mg total) by mouth as needed for erectile dysfunction.   triamcinolone  cream (KENALOG ) 0.1 % Apply 1 Application topically 2 (two) times daily.  ROS:   Please see the history of present illness.    All other systems reviewed and are negative.  EKGs       Risk Assessment/Calculations:           Physical Exam:    VS:  BP 118/68   Pulse 96   Ht 6' 3 (1.905 m)   Wt 229 lb 12.8 oz (104.2 kg)   SpO2 97%   BMI 28.72 kg/m     Wt Readings from Last 3 Encounters:  04/13/24 229 lb 12.8 oz (104.2 kg)  04/02/24 229 lb (103.9 kg)  04/02/24 229 lb (103.9 kg)     GEN: Well nourished, well developed in no acute distress NECK: No JVD CARDIAC: RRR, 2/6 systolic murmur heard best at RUSB. Soft murmur at apex. No rubs, gallops RESPIRATORY: diffuse wheezing on exam. ABDOMEN: Soft, non-tender, non-distended EXTREMITIES:  No edema; No deformity.    ASSESSMENT:    1. Aortic valve stenosis, etiology of cardiac valve disease unspecified   2. Mitral valve stenosis, unspecified etiology   3. PAD (peripheral artery disease) (HCC)   4. Essential hypertension   5. Hyperlipidemia LDL goal <70   6. Asthma-COPD overlap syndrome (HCC)     PLAN:    In order of problems listed above:  Aortic stenosis:  -- Reported as mild on most recent echo 11/29/23. -- Plan for follow up echo and OV with Dr. Wonda in 11/2024.   Mitral stenosis:  -- Reported as possibly rheumatic with mod-severe MAC with mod MS (mean gradient 7 mm Hg) on most recent echo 11/29/23. -- Plan for follow up echo and OV with Dr. Wonda in 11/2024.   -- Will likely need a TEE if this progresses to severe.   PAD: --S/p amputation of the 4th and 5th toes in 2021, severe bilateral pedal disease on angiogram in February 2022. -- ABIs in 04/2023 were stable.  -- Followed by vascular surgery.   -- Continue aspirin  81mg  daily, Plavix  75mg  daily and Lipitor 40mg  daily (was not taking this but stressed importance of restarting and he is agreeable).   Hypertension:  -- BP well controlled.  -- Not currently on any antihypertensives.   Hyperlipidemia:  -- LDL was 113 on 03/06/24. -- As above, he was not taking his Lipitor for unclear reasons but he will restart today.  -- Recheck lipids next visit and escalate therapy if not at goal.    COPD/asthma/OSA:  -- Recent admission for AECOPD in 03/2024. -- Needs lung disease better optimized before proceeding with TKA. -- Had an apt with Dr. Buck that he had to cancel when admitted. He said he needed clearance from a cardiac standpoint to get a sleep study done. He is cleared to proceed from our end.  -- Has worsening SOB and wheezing on exam. I think SOB pulmonary related. Has follow up with Dr. Theophilus 7/28. -- Letter provided for pt to take a break from [employer] school until medical issues more optimized.     Medication Adjustments/Labs and Tests Ordered: Current medicines are reviewed at length with the patient today.  Concerns regarding medicines are outlined above.  No orders of the defined types were placed in this encounter.  No orders of the defined types were placed in this encounter.   Patient Instructions  Medication Instructions:  Your physician recommends that you continue on your current medications as directed. Please refer to the Current Medication list given to you today.  *If you need a refill on  your cardiac medications before your next appointment, please call your pharmacy*  Lab Work: None Needed If you have labs (blood work) drawn today and your tests are completely  normal, you will receive your results only by: MyChart Message (if you have MyChart) OR A paper copy in the mail If you have any lab test that is abnormal or we need to change your treatment, we will call you to review the results.  Testing/Procedures: Your physician has requested that you have an echocardiogram. Echocardiography is a painless test that uses sound waves to create images of your heart. It provides your doctor with information about the size and shape of your heart and how well your heart's chambers and valves are working. This procedure takes approximately one hour. There are no restrictions for this procedure. Please do NOT wear cologne, perfume, aftershave, or lotions (deodorant is allowed). Please arrive 15 minutes prior to your appointment time.  Please note: We ask at that you not bring children with you during ultrasound (echo/ vascular) testing. Due to room size and safety concerns, children are not allowed in the ultrasound rooms during exams. Our front office staff cannot provide observation of children in our lobby area while testing is being conducted. An adult accompanying a patient to their appointment will only be allowed in the ultrasound room at the discretion of the ultrasound technician under special circumstances. We apologize for any inconvenience.   Will be scheduled for 11/2024  Follow-Up: At [facility], you and your health needs are our priority.  As part of our continuing mission to provide you with exceptional heart care, our providers are all part of one team.  This team includes your primary Cardiologist (physician) and Advanced Practice Providers or APPs (Physician Assistants and Nurse Practitioners) who all work together to provide you with the care you need, when you need it.  Your next appointment:   6 month(s)  Provider:   Ozell Fell, MD    We recommend signing up for the patient portal called MyChart.  Sign up information is  provided on this After Visit Summary.  MyChart is used to connect with patients for Virtual Visits (Telemedicine).  Patients are able to view lab/test results, encounter notes, upcoming appointments, etc.  Non-urgent messages can be sent to your provider as well.   To learn more about what you can do with MyChart, go to [email or web address].   Other Instructions Please contact Dr. True Plum office to schedule sleep study.  [phone]   SEE ATTACHED LETTER FOR SCHOOL LEAVE OF [place]        Signed, Lamarr Hummer, PA-C  04/13/2024 9:43 AM    [facility]

## 2024-04-11 ENCOUNTER — Other Ambulatory Visit (HOSPITAL_COMMUNITY): Payer: Self-pay

## 2024-04-13 ENCOUNTER — Encounter: Payer: Self-pay | Admitting: Physician Assistant

## 2024-04-13 ENCOUNTER — Telehealth: Payer: Self-pay

## 2024-04-13 ENCOUNTER — Ambulatory Visit: Attending: Physician Assistant | Admitting: Physician Assistant

## 2024-04-13 VITALS — BP 118/68 | HR 96 | Ht 75.0 in | Wt 229.8 lb

## 2024-04-13 DIAGNOSIS — I10 Essential (primary) hypertension: Secondary | ICD-10-CM | POA: Diagnosis not present

## 2024-04-13 DIAGNOSIS — E785 Hyperlipidemia, unspecified: Secondary | ICD-10-CM

## 2024-04-13 DIAGNOSIS — I739 Peripheral vascular disease, unspecified: Secondary | ICD-10-CM

## 2024-04-13 DIAGNOSIS — I350 Nonrheumatic aortic (valve) stenosis: Secondary | ICD-10-CM

## 2024-04-13 DIAGNOSIS — I050 Rheumatic mitral stenosis: Secondary | ICD-10-CM | POA: Diagnosis not present

## 2024-04-13 DIAGNOSIS — J4489 Other specified chronic obstructive pulmonary disease: Secondary | ICD-10-CM

## 2024-04-13 DIAGNOSIS — E1159 Type 2 diabetes mellitus with other circulatory complications: Secondary | ICD-10-CM

## 2024-04-13 NOTE — Transitions of Care (Post Inpatient/ED Visit) (Deleted)
   04/13/2024  Name: Miriam Kestler MRN: [id number] DOB: [birth year]  {[id number]}

## 2024-04-13 NOTE — Patient Instructions (Addendum)
 Medication Instructions:  Your physician recommends that you continue on your current medications as directed. Please refer to the Current Medication list given to you today.  *If you need a refill on your cardiac medications before your next appointment, please call your pharmacy*  Lab Work: None Needed If you have labs (blood work) drawn today and your tests are completely normal, you will receive your results only by: MyChart Message (if you have MyChart) OR A paper copy in the mail If you have any lab test that is abnormal or we need to change your treatment, we will call you to review the results.  Testing/Procedures: Your physician has requested that you have an echocardiogram. Echocardiography is a painless test that uses sound waves to create images of your heart. It provides your doctor with information about the size and shape of your heart and how well your heart's chambers and valves are working. This procedure takes approximately one hour. There are no restrictions for this procedure. Please do NOT wear cologne, perfume, aftershave, or lotions (deodorant is allowed). Please arrive 15 minutes prior to your appointment time.  Please note: We ask at that you not bring children with you during ultrasound (echo/ vascular) testing. Due to room size and safety concerns, children are not allowed in the ultrasound rooms during exams. Our front office staff cannot provide observation of children in our lobby area while testing is being conducted. An adult accompanying a patient to their appointment will only be allowed in the ultrasound room at the discretion of the ultrasound technician under special circumstances. We apologize for any inconvenience.   Will be scheduled for 11/2024  Follow-Up: At [facility], you and your health needs are our priority.  As part of our continuing mission to provide you with exceptional heart care, our providers are all part of one team.  This team  includes your primary Cardiologist (physician) and Advanced Practice Providers or APPs (Physician Assistants and Nurse Practitioners) who all work together to provide you with the care you need, when you need it.  Your next appointment:   6 month(s)  Provider:   Ozell Fell, MD    We recommend signing up for the patient portal called MyChart.  Sign up information is provided on this After Visit Summary.  MyChart is used to connect with patients for Virtual Visits (Telemedicine).  Patients are able to view lab/test results, encounter notes, upcoming appointments, etc.  Non-urgent messages can be sent to your provider as well.   To learn more about what you can do with MyChart, go to [email or web address].   Other Instructions Please contact Dr. True Plum office to schedule sleep study.  [phone]   SEE ATTACHED LETTER FOR SCHOOL LEAVE OF [place]

## 2024-04-13 NOTE — Transitions of Care (Post Inpatient/ED Visit) (Signed)
 04/13/2024  Patient ID: Isla Pinal, male   DOB: 05-18-1961, 63 y.o.   MRN: [id number]  Documentation Note: 04/13/24: RN CM TOC Week 2, Attempt #2 call completed with patient. Patient asked what the calls were for and RN CM re-informed him of enrollment outreach and weekly follow calls/30 -day program information. Patient thanked RN CM for call, stated he was doing okay and had just seen his cardiologist this am, but did not need further follow up calls including today's call. Disenrolled (unenrolled) patient from [facility] 30 day program. No further outreach will be made for this discharge/enrollment.   Bing Edison MSN, RN RN Case Sales executive Health  [facility] Office Hours M-F [phone] Direct Dial: [phone] Main Phone [phone]  Fax: [phone] [email or web address]

## 2024-04-14 ENCOUNTER — Ambulatory Visit: Admitting: Student

## 2024-04-14 ENCOUNTER — Other Ambulatory Visit: Payer: Self-pay

## 2024-04-14 VITALS — BP 129/77 | HR 102 | Temp 99.1°F | Ht 75.0 in | Wt 230.2 lb

## 2024-04-14 DIAGNOSIS — E1151 Type 2 diabetes mellitus with diabetic peripheral angiopathy without gangrene: Secondary | ICD-10-CM

## 2024-04-14 DIAGNOSIS — I10 Essential (primary) hypertension: Secondary | ICD-10-CM | POA: Diagnosis not present

## 2024-04-14 DIAGNOSIS — Z23 Encounter for immunization: Secondary | ICD-10-CM

## 2024-04-14 DIAGNOSIS — Z7985 Long-term (current) use of injectable non-insulin antidiabetic drugs: Secondary | ICD-10-CM | POA: Diagnosis not present

## 2024-04-14 DIAGNOSIS — Z1211 Encounter for screening for malignant neoplasm of colon: Secondary | ICD-10-CM | POA: Insufficient documentation

## 2024-04-14 DIAGNOSIS — Z794 Long term (current) use of insulin: Secondary | ICD-10-CM

## 2024-04-14 DIAGNOSIS — J441 Chronic obstructive pulmonary disease with (acute) exacerbation: Secondary | ICD-10-CM

## 2024-04-14 DIAGNOSIS — Z122 Encounter for screening for malignant neoplasm of respiratory organs: Secondary | ICD-10-CM | POA: Insufficient documentation

## 2024-04-14 MED ORDER — IBUPROFEN 600 MG PO TABS
600.0000 mg | ORAL_TABLET | Freq: Three times a day (TID) | ORAL | 0 refills | Status: DC
  Filled 2024-04-14: qty 90, 30d supply, fill #0

## 2024-04-14 MED ORDER — TADALAFIL 10 MG PO TABS
10.0000 mg | ORAL_TABLET | ORAL | 1 refills | Status: DC | PRN
  Filled 2024-04-14 (×2): qty 20, 30d supply, fill #0

## 2024-04-14 NOTE — Progress Notes (Signed)
 CC: Hospital follow-up for COPD exacerbation  HPI:  [name] is a 63 y.o. male living with a history stated below and presents today for hospital follow-up. Please see problem based assessment and plan for additional details.  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 1-2 times daily     albuterol  (PROVENTIL ) (2.5 MG/3ML) 0.083% nebulizer solution Take 3 mLs (2.5 mg total) by nebulization 2 (two) times daily. 90 mL 5   albuterol  (VENTOLIN  HFA) 108 (90 Base) MCG/ACT inhaler Inhale 1-2 puffs into the lungs every 4 (four) hours as needed for wheezing or shortness of breath. 8.5 g 5   aspirin  EC 81 MG EC tablet Take 1 tablet (81 mg total) by mouth daily. 30 tablet 3   atorvastatin  (LIPITOR) 40 MG tablet Take 40 mg by mouth daily.     Budeson-Glycopyrrol-Formoterol  (BREZTRI  AEROSPHERE) 160-9-4.8 MCG/ACT AERO Inhale 2 puffs into the lungs in the morning and at bedtime. 10.7 g 11    cetirizine  (ZYRTEC  ALLERGY) 10 MG tablet Take 1 tablet (10 mg total) by mouth daily. 30 tablet 2   clopidogrel  (PLAVIX ) 75 MG tablet Take 1 tablet (75 mg total) by mouth daily. 90 tablet 3   Continuous Glucose Sensor (FREESTYLE LIBRE 3 SENSOR) MISC Place 1 sensor on the skin every 14 days. Use to check glucose continuously. 6 each 3   diphenhydrAMINE  (BENADRYL  ALLERGY) 25 mg capsule Take 1 capsule (25 mg total) by mouth at bedtime as needed for allergies or itching. 30 capsule 0   Dulaglutide  (TRULICITY ) 4.5 MG/0.5ML SOAJ Inject 4.5 mg as directed once a week. 6 mL 3   DUPIXENT 300 MG/2ML SOAJ Inject 300 mg into the skin every 14 (fourteen) days.     escitalopram  (LEXAPRO ) 5 MG tablet Take 1 tablet (5 mg total) by mouth daily. 90 tablet 3   ferrous sulfate  325 (65 FE) MG tablet Take 1 tablet (325 mg total) by mouth daily. 30 tablet 3   fluticasone  (FLONASE ) 50 MCG/ACT nasal spray Place 1 spray into both nostrils as needed for allergies. 16 g 11   glucose blood test strip 1 each by Other route in the morning and at bedtime.     insulin  degludec (TRESIBA ) 100 UNIT/ML FlexTouch Pen Inject 20 Units into the skin daily. 15 mL  4   Insulin  Pen Needle (PEN NEEDLES) 32G X 4 MM MISC Use 1 each daily as directed. 100 each 3   ipratropium-albuterol  (DUONEB) 0.5-2.5 (3) MG/3ML SOLN Use 1 vial (3 mLs) by nebulization every 6 (six) hours as needed. 360 mL 5   methocarbamol  (ROBAXIN ) 500 MG tablet Take 1 tablet (500 mg total) by mouth every 6 (six) hours. 5 tablet 0   montelukast  (SINGULAIR ) 10 MG tablet Take 1 tablet (10 mg total) by mouth at bedtime. 90 tablet 3   pioglitazone  (ACTOS ) 30 MG tablet Take 30 mg by mouth daily.     pregabalin  (LYRICA ) 100 MG capsule Take 1 capsule (100 mg total) by mouth 3 (three) times daily. 90 capsule 5   SYNJARDY  12.02-999 MG TABS SMARTSIG:1 Tablet(s) By Mouth Morning-Night     triamcinolone  cream (KENALOG ) 0.1 % Apply 1 Application topically 2 (two) times daily. 30 g 3   No  current facility-administered medications on file prior to visit.    Family History  Problem Relation Age of Onset   Hypertension Mother    Congestive Heart Failure Mother    Hypertension Sister    Glaucoma Sister    Asthma Daughter    Colon cancer Neg Hx    Colon polyps Neg Hx    Esophageal cancer Neg Hx    Rectal cancer Neg Hx    Stomach cancer Neg Hx     Social History   Socioeconomic History   Marital status: Divorced    Spouse name: Not on file   Number of children: 2   Years of education: Not on file   Highest education level: Not on file  Occupational History   Occupation: Disabled  Tobacco Use   Smoking status: Former    Current packs/day: 0.00    Average packs/day: 1 pack/day for 45.0 years (45.0 ttl pk-yrs)    Types: Cigarettes    Start date: 08/22/1977    Quit date: 08/22/2022    Years since quitting: 1.6    Passive exposure: Never   Smokeless tobacco: Never   Tobacco comments:    Quit x 2 weeks.  Vaping Use   Vaping status: Never Used  Substance and Sexual Activity   Alcohol use: Not Currently    Alcohol/week: 0.0 standard drinks of alcohol    Comment: 08/24/2019 nothing since <2010   Drug use: Not Currently    Types: Cocaine    Comment: 10/23/2018 nothing since <1990   Sexual activity: Not on file  Other Topics Concern   Not on file  Social History Narrative   Currently working on obtaining GED from [employer]- July 2018      Lives alone.   Social Drivers of Corporate investment banker Strain: Low Risk  (09/25/2023)   Overall Financial Resource Strain (CARDIA)    Difficulty of Paying Living Expenses: Not hard at all  Food Insecurity: No Food Insecurity (04/02/2024)   Hunger Vital Sign    Worried About Running Out of Food in the Last Year: Never true    Ran Out of Food in the Last Year: Never true  Transportation Needs: No Transportation Needs (04/02/2024)   PRAPARE - Administrator, Civil Service (Medical): No    Lack of  Transportation (Non-Medical): No  Physical Activity: Sufficiently Active (09/25/2023)   Exercise Vital Sign    Days of Exercise per Week: 7 days    Minutes of Exercise per Session: 30 min  Stress: No Stress Concern Present (09/25/2023)  [employer] of Occupational Health - Occupational Stress Questionnaire    Feeling of Stress : Only a little  Social Connections: Moderately Isolated (09/25/2023)   Social Connection and Isolation Panel    Frequency of Communication with Friends and Family: More than three times a week    Frequency of Social Gatherings with Friends and Family: Twice a week    Attends Religious Services: More than 4 times per year    Active Member of [employer] or Organizations: No    Attends Banker Meetings: Never    Marital Status: Divorced  Catering manager Violence: Not At Risk (04/02/2024)   Humiliation, Afraid, Rape, and Kick questionnaire    Fear of Current or Ex-Partner: No    Emotionally Abused: No    Physically Abused: No    Sexually Abused: No    Review of Systems: ROS negative except for what is noted on the assessment and plan.  Vitals:   04/14/24 1512  BP: 129/77  Pulse: (!) 102  Temp: 99.1 F (37.3 C)  TempSrc: Oral  SpO2: 93%  Weight: 230 lb 3.2 oz (104.4 kg)  Height: 6' 3 (1.905 m)    Physical Exam: Constitutional: well-appearing man, sitting in chair , in no acute distress Cardiovascular: regular rate and rhythm, no m/r/g Pulmonary/Chest: normal work of breathing on room air, no wheezing appreciated Abdominal: soft, non-tender, non-distended MSK: Right knee wrapped in knee brace Skin: warm and dry Psych: normal mood and behavior  Assessment & Plan:   Essential hypertension BP Readings from Last 3 Encounters:  04/14/24 129/77  04/13/24 118/68  04/02/24 136/86  Blood pressure at goal.  No active concerns at this time  Acute exacerbation of COPD with asthma [facility]) Richard [name] presented to the office for follow-up  after his recent hospitalization. He was admitted and discharged on June 26 for a COPD exacerbation, likely related to secondhand smoke exposure. Since discharge, he reports good adherence to his prednisone  regimen. Today, he denies any respiratory symptoms and feels well. He has had no breathing difficulties since leaving the hospital and continues to use his home inhalers as prescribed. On examination, lung auscultation revealed no wheezing. I also discussed the benefits of the pneumococcal vaccine given his COPD history, and he agreed to receive it during today's visit. - Continue albuterol , Singulair , Breztri  and Dupixent as needed - Continue follow-up with pulmonology as needed.  Next appointment is 7/28 -Give pneumococcal vaccine ( Prevnar 20 )  Type 2 diabetes mellitus with peripheral artery disease (HCC) Lab Results  Component Value Date   HGBA1C 7.6 (A) 02/14/2024   HGBA1C 7.9 (H) 09/19/2023   HGBA1C 8.7 (A) 06/18/2023  The patient's hemoglobin A1c is improving, though there remains room for further progress. Continue the current regimen of Tresiba  and Trulicity , and recheck hemoglobin A1c in one month. Given his history of diabetic retinopathy in 2022, a referral for ophthalmology evaluation is appropriate at this time - Continue Tresiba  and Trulicity  -  Ambulatory referral to ophthalmology  Encounter for screening for lung cancer Richard [name] has a smoking history exceeding 20 pack-years and quit approximately three years ago. This qualifies him as a candidate for lung cancer screening with a low-dose CT scan. The benefits of screening were discussed with him, and he agreed to proceed. -I referred to radiology for a low-dose CT scan  Screening for colon cancer Richard [name] is a 63 year old male with a history of smoking who has not yet undergone colonoscopy screening for colon cancer. The  benefits of screening were discussed with him, and he has agreed to proceed at this  time. -Ambulatory referral to gastroenterology for colonoscopy     Patient discussed with Dr. Lovie Drue Grow, M.D [facility] Phone: [phone] Date 04/14/2024 Time 5:20 PM

## 2024-04-14 NOTE — Assessment & Plan Note (Signed)
 BP Readings from Last 3 Encounters:  04/14/24 129/77  04/13/24 118/68  04/02/24 136/86  Blood pressure at goal.  No active concerns at this time

## 2024-04-14 NOTE — Assessment & Plan Note (Signed)
 Lab Results  Component Value Date   HGBA1C 7.6 (A) 02/14/2024   HGBA1C 7.9 (H) 09/19/2023   HGBA1C 8.7 (A) 06/18/2023  The patient's hemoglobin A1c is improving, though there remains room for further progress. Continue the current regimen of Tresiba  and Trulicity , and recheck hemoglobin A1c in one month. Given his history of diabetic retinopathy in 2022, a referral for ophthalmology evaluation is appropriate at this time - Continue Tresiba  and Trulicity  -  Ambulatory referral to ophthalmology

## 2024-04-14 NOTE — Assessment & Plan Note (Addendum)
 Mr. Finigan presented to the office for follow-up after his recent hospitalization. He was admitted and discharged on June 26 for a COPD exacerbation, likely related to secondhand smoke exposure. Since discharge, he reports good adherence to his prednisone  regimen. Today, he denies any respiratory symptoms and feels well. He has had no breathing difficulties since leaving the hospital and continues to use his home inhalers as prescribed. On examination, lung auscultation revealed no wheezing. I also discussed the benefits of the pneumococcal vaccine given his COPD history, and he agreed to receive it during today's visit. - Continue albuterol , Singulair , Breztri  and Dupixent as needed - Continue follow-up with pulmonology as needed.  Next appointment is 7/28 -Give pneumococcal vaccine ( Prevnar 20 )

## 2024-04-14 NOTE — Assessment & Plan Note (Signed)
 Richard [name] has a smoking history exceeding 20 pack-years and quit approximately three years ago. This qualifies him as a candidate for lung cancer screening with a low-dose CT scan. The benefits of screening were discussed with him, and he agreed to proceed. -I referred to radiology for a low-dose CT scan

## 2024-04-14 NOTE — Patient Instructions (Addendum)
 Thank you, [name], for trusting us  with your care today. We reviewed your recent hospital admission, and I'm glad to hear your breathing has improved. I have refilled your medications as requested--please continue taking them as prescribed. Thank you for providing referrals for your colonoscopy and ophthalmology appointments. Radiology will contact you soon to schedule your lung cancer screening.  I have ordered the following labs for you:  Lab Orders  No laboratory test(s) ordered today     Tests ordered today:    Referrals ordered today:   Referral Orders         Ambulatory referral to Ophthalmology         Ambulatory referral to Gastroenterology       I have ordered the following medication/changed the following medications:   Stop the following medications: Medications Discontinued During This Encounter  Medication Reason   Mepolizumab  (NUCALA ) 100 MG/ML SOAJ Discontinued by provider   ibuprofen  (ADVIL ) 600 MG tablet Reorder   predniSONE  (DELTASONE ) 10 MG tablet    azithromycin  (ZITHROMAX ) 500 MG tablet    tadalafil  (CIALIS ) 10 MG tablet Reorder     Start the following medications: Meds ordered this encounter  Medications   ibuprofen  (ADVIL ) 600 MG tablet    Sig: Take 1 tablet (600 mg total) by mouth 3 (three) times daily.    Dispense:  90 tablet    Refill:  0   tadalafil  (CIALIS ) 10 MG tablet    Sig: Take 1 tablet (10 mg total) by mouth as needed for erectile dysfunction.    Dispense:  20 tablet    Refill:  1     Follow up: 2 months   Remember:   Should you have any questions or concerns please call the internal medicine clinic at [phone].   Richard Lisa Grow MD 04/14/2024, 3:54 PM   [facility]

## 2024-04-14 NOTE — Assessment & Plan Note (Signed)
 Richard [name] is a 63 year old male with a history of smoking who has not yet undergone colonoscopy screening for colon cancer. The benefits of screening were discussed with him, and he has agreed to proceed at this time. -Ambulatory referral to gastroenterology for colonoscopy

## 2024-04-15 NOTE — Addendum Note (Signed)
 Addended by: Jihad Brownlow L on: 04/15/2024 08:45 AM   Modules accepted: Level of Service

## 2024-04-15 NOTE — Progress Notes (Signed)
 Internal Medicine Clinic Attending  Case discussed with the resident at the time of the visit.  We reviewed the resident's history and exam and pertinent patient test results.  I agree with the assessment, diagnosis, and plan of care documented in the resident's note.   I agree with plan for Pneumococcal vaccination in this patient with severe COPD Dr Amoako reviewed the risk/benefit of annual CT for lung cancer screening, and we will proceed with this

## 2024-04-16 ENCOUNTER — Other Ambulatory Visit: Payer: Self-pay

## 2024-04-21 ENCOUNTER — Telehealth: Payer: Self-pay | Admitting: Pulmonary Disease

## 2024-04-21 DIAGNOSIS — G4752 REM sleep behavior disorder: Secondary | ICD-10-CM

## 2024-04-21 NOTE — Telephone Encounter (Signed)
 Patient has called in twice today regarding his sleep appointment.   The first time I attempted to transfer to the sleep lab but no one was available to take the call. Patient stated that he has been leaving voicemail's and attempting to reschedule his 6/23 appointment since the end of June but that no one is calling him back. I relayed the information in the phone note from 6/23 to the patient and he was agreeable to calling Pulmonology to schedule an appointment. He stated he has already seen them and has been cleared by them.  He called back again just now to advise that he spoke with his pulmonology office and they told him he was needing to see Dr Buck for this appointment, not them. 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 He expressed extreme frustration with the situation, stating he is being tossed around between offices and not getting the help he needs. He is going to again try to reach out to his pulmonology office about this. I advised him that they are able to see our records and notes in their system if they have any questions as to why he is calling.  Just an FYI if he calls back, he may want to speak with a manager regarding this situation.

## 2024-04-21 NOTE — Telephone Encounter (Signed)
Please order home sleep study. 

## 2024-04-21 NOTE — Telephone Encounter (Signed)
 Order placed and pt notified.

## 2024-04-21 NOTE — Telephone Encounter (Signed)
 I received one voicemail from this patient on 04/20/24, prior to I had not received any messages or calls from this patient after his appointment was cancelled by Dr. Buck on 03/30/24.  Before I could return his VM from yesterday I was asked if I was available to take a call from the patient (around 11:45am today) - I was unavailable at that time and asked for it to be relayed that I will return the patient's call shortly. I was then told patient was relayed prior information on why his appointment was cancelled and what the recommended next steps were.   Per Dr. Obie note on 6/23:  I reviewed patient's record in preparation for a sleep consult visit.  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  We contacted the patient this morning and he picked up the phone himself although he was in the hospital.  He is hospitalized currently for COPD exacerbation and respiratory distress (upon chart review).  He is agreeable to addressing sleep disorder including OSA concern through pulmonology and requested a MyChart message reminder.  He was agreeable to canceling today's visit and in fact reported that he was not going to come, as he is currently in the hospital.   I was asked to reach out to patient to make sure he did not have any concerns and there was no more confusion on what was needed, but upon looking into the patient's chart it was found that he has contacted his Pulmonary provider and they have placed orders for a Home Sleep Study for the patient.   At this time [name] will not interfere any further. If anything further is needed from the Sleep Lab @ [facility] please contact me directly at [phone].   Michayla Mcneil

## 2024-04-21 NOTE — Telephone Encounter (Signed)
 PT calling very frustrated so E2C2 tsf to front desk. He  is needing surgery that he can not get until he has had a sleep study. You can see from last AVS Dr. Theophilus states the following:   OBSTRUCTIVE SLEEP APNEA (OSA): OSA is a condition where your breathing stops and starts during sleep. You missed your previous sleep study due to hospitalization, so it is important to reschedule it. Contact Dr. Buck at [facility] to set up a new appointment.   When PT called Dr. Obie office they said WE were supposed to order a sleep study.   PT does have an appointment upcoming but he is distressed the sleep study has not been ordered. Do we order or must we referr him to his PCP (He thinks the PCP ordered the last sleep study.) Please contact PT to advise. Thank you.

## 2024-04-23 ENCOUNTER — Telehealth: Payer: Self-pay

## 2024-04-23 ENCOUNTER — Telehealth: Payer: Self-pay | Admitting: Pulmonary Disease

## 2024-04-23 DIAGNOSIS — G4752 REM sleep behavior disorder: Secondary | ICD-10-CM

## 2024-04-23 NOTE — Telephone Encounter (Signed)
 Patient called in. He stated that he would like to do in lab sleep study. He does not want to do HST.   Dr. Theophilus, please advise if okay to order in lab study.

## 2024-04-23 NOTE — Telephone Encounter (Signed)
 PT calling again. Margie asked me to advise him we await Dr. Jiles reply. He spoke to someone at [facility] PT services who sent him to someone, who sent him to someone, who transferred him to [facility] who has no information on him. Now he is confused as to why he was talking to [facility]. I listened to his concerns and empathized with his frustration and pain and explained it's a process. I states [facility] has no power to auth anything w/o Dr.'s consent. We are at a stand still. He understood and asked for more concrete answers as to when he would hear something. I adv we do out best, he is on our radar and we will call him when we can.

## 2024-04-23 NOTE — Telephone Encounter (Signed)
 Copied from CRM [id number]. Topic: Appointments - Scheduling Inquiry for Clinic >> Apr 21, 2024 11:31 AM Russell PARAS wrote: Reason for CRM:   Pt is contacting clinic to reschedule his sleep consult/sleep study. Missed original appt due to hospitalization. Reviewed chart and advised his consult was scheduled with the office of Dr. True Mar and would need to contact them to reschedule his consult. He reports he contacted them prior to calling our clinic and was advised he would need to contact us  to reschedule.   Advised pt we do not have a sleep consult/study appt that was ever scheduled, therefore we cannot reschedule.   Advised pt the original referral was placed by Dr. Mliss Karvonen, not his provider here(Dr. Theophilus) and that it may be beneficial to contact her to verify who he would need to call and reschedule.   He is waiting for the sleep consult/sleep study to be cleared for knee replacement surgery and is frustrated because he is in considerable pain.   Requested call back with any update or information of who he needs to contact to have this issue handled. He also requested if nurse could reach out to Dr. Obie office(# [phone]) to possibly clear up any confusion.   CB# [phone]   Please refer to 04/21/2024 phone note.

## 2024-04-23 NOTE — Telephone Encounter (Signed)
 Copied from CRM [id number]. Topic: General - Other >> Apr 23, 2024  9:04 AM Russell PARAS wrote: Reason for CRM:   Pt is requesting [facility] contact information be sent to his MyChart so that he has on hand. Offered to give over phone, but would rather have message >> Apr 23, 2024  9:32 AM Isabell A wrote: Patient calling - transferred to CAL.    Other encounters are already made for this. Awaiting for Dr Kai response. NFN

## 2024-04-23 NOTE — Telephone Encounter (Signed)
 Please order an inlab sleep study. I hope the insurance will approve it

## 2024-04-23 NOTE — Telephone Encounter (Signed)
 Patient called and wants and in lab study instead of SNAP. I see other encounters. Dr Theophilus would you please advise if we can do an in lab study?

## 2024-04-24 NOTE — Telephone Encounter (Signed)
 Spoke with the pt and notified him that I placed order for in lab study and someone will call him with appt. Nothing further needed.

## 2024-04-24 NOTE — Addendum Note (Signed)
 Addended by: Faye Strohman M on: 04/24/2024 10:23 AM   Modules accepted: Orders

## 2024-04-24 NOTE — Telephone Encounter (Signed)
 Split night study please

## 2024-04-24 NOTE — Telephone Encounter (Signed)
 Ok, do you want split night, CPAP titration or regular PSG?

## 2024-04-27 ENCOUNTER — Other Ambulatory Visit: Payer: Self-pay

## 2024-04-28 ENCOUNTER — Ambulatory Visit (INDEPENDENT_AMBULATORY_CARE_PROVIDER_SITE_OTHER): Admitting: Gastroenterology

## 2024-04-28 ENCOUNTER — Encounter: Payer: Self-pay | Admitting: Gastroenterology

## 2024-04-28 VITALS — BP 110/66 | HR 92 | Ht 71.5 in | Wt 230.4 lb

## 2024-04-28 DIAGNOSIS — D509 Iron deficiency anemia, unspecified: Secondary | ICD-10-CM | POA: Diagnosis not present

## 2024-04-28 DIAGNOSIS — Z791 Long term (current) use of non-steroidal anti-inflammatories (NSAID): Secondary | ICD-10-CM | POA: Diagnosis not present

## 2024-04-28 MED ORDER — NA SULFATE-K SULFATE-MG SULF 17.5-3.13-1.6 GM/177ML PO SOLN: 1.0000 | Freq: Once | ORAL | 0 refills | Status: AC

## 2024-04-28 NOTE — Patient Instructions (Addendum)
 You have been scheduled for an endoscopy and colonoscopy. Please follow the written instructions given to you at your visit today.  If you use inhalers (even only as needed), please bring them with you on the day of your procedure.  DO NOT TAKE 7 DAYS PRIOR TO TEST- Trulicity  (dulaglutide ) Ozempic, Wegovy (semaglutide) Mounjaro (tirzepatide) Bydureon Bcise (exanatide extended release)  DO NOT TAKE 1 DAY PRIOR TO YOUR TEST Rybelsus (semaglutide) Adlyxin (lixisenatide) Victoza (liraglutide) Byetta (exanatide) _______________________________________________________________________  _______________________________________________________  If your blood pressure at your visit was 140/90 or greater, please contact your primary care physician to follow up on this.  _______________________________________________________  If you are age 65 or older, your body mass index should be between 23-30. Your Body mass index is 31.68 kg/m. If this is out of the aforementioned range listed, please consider follow up with your Primary Care Provider.  If you are age 5 or younger, your body mass index should be between 19-25. Your Body mass index is 31.68 kg/m. If this is out of the aformentioned range listed, please consider follow up with your Primary Care Provider.   ________________________________________________________  The [facility] providers would like to encourage you to use MYCHART to communicate with providers for non-urgent requests or questions.  Due to long hold times on the telephone, sending your provider a message by [facility] may be a faster and more efficient way to get a response.  Please allow 48 business hours for a response.  Please remember that this is for non-urgent requests.  _______________________________________________________  [facility] is using a team-based approach to care.  Your team is made up of your doctor and two to three APPS. Our APPS (Nurse  Practitioners and Physician Assistants) work with your physician to ensure care continuity for you. They are fully qualified to address your health concerns and develop a treatment plan. They communicate directly with your gastroenterologist to care for you. Seeing the Advanced Practice Practitioners on your physician's team can help you by facilitating care more promptly, often allowing for earlier appointments, access to diagnostic testing, procedures, and other specialty referrals.
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
## 2024-04-30 ENCOUNTER — Ambulatory Visit (INDEPENDENT_AMBULATORY_CARE_PROVIDER_SITE_OTHER): Admitting: Podiatry

## 2024-04-30 ENCOUNTER — Encounter: Payer: Self-pay | Admitting: Podiatry

## 2024-04-30 DIAGNOSIS — M79674 Pain in right toe(s): Secondary | ICD-10-CM

## 2024-04-30 DIAGNOSIS — L84 Corns and callosities: Secondary | ICD-10-CM | POA: Diagnosis not present

## 2024-04-30 DIAGNOSIS — E1140 Type 2 diabetes mellitus with diabetic neuropathy, unspecified: Secondary | ICD-10-CM

## 2024-04-30 DIAGNOSIS — B351 Tinea unguium: Secondary | ICD-10-CM

## 2024-04-30 DIAGNOSIS — M79675 Pain in left toe(s): Secondary | ICD-10-CM

## 2024-04-30 NOTE — Progress Notes (Signed)
 This patient presents to the office with chief complaint of  painful callus ball of right foot.  .  Patient is a diabetic with amputation left forefoot.  Patient is unable to  self treat his own callus. This patient presents  to the office today for treatment of his thick callus.   General Appearance  Alert, conversant and in no acute stress.  Vascular  Dorsalis pedis and posterior tibial  pulses are  palpable  right    Temperature is within normal limits  right   Neurologic  Senn-Weinstein monofilament wire test within normal limits  right. Muscle power within normal limits right   Nails Thick disfigured discolored nails with subungual debris  from hallux to fifth toes right foot.. No evidence of bacterial infection or drainage bilaterally.  Orthopedic  No limitations of motion of motion feet .  No crepitus or effusions noted.  No bony pathology or digital deformities noted. TMA left foot.  Skin  normotropic skin with noted bilaterally.  No signs of infections or ulcers noted.   Porokeratosis sub 2 right foot.  Porokeratosis right foot  Debride right pre-ulcerous lesion  right foot with nail nipper and dremel tool.   RTC  5  weeks    Cordella Bold DPM

## 2024-05-04 ENCOUNTER — Ambulatory Visit (INDEPENDENT_AMBULATORY_CARE_PROVIDER_SITE_OTHER): Admitting: Pulmonary Disease

## 2024-05-04 ENCOUNTER — Encounter: Payer: Self-pay | Admitting: Pulmonary Disease

## 2024-05-04 VITALS — BP 120/70 | HR 79 | Ht 75.0 in | Wt 230.4 lb

## 2024-05-04 DIAGNOSIS — G4752 REM sleep behavior disorder: Secondary | ICD-10-CM

## 2024-05-04 DIAGNOSIS — J4489 Other specified chronic obstructive pulmonary disease: Secondary | ICD-10-CM | POA: Diagnosis not present

## 2024-05-04 DIAGNOSIS — J4551 Severe persistent asthma with (acute) exacerbation: Secondary | ICD-10-CM

## 2024-05-04 NOTE — Patient Instructions (Signed)
 VISIT SUMMARY:  Today, we discussed your worsening asthma symptoms, potential environmental allergen exposure, iron  deficiency anemia, and upcoming sleep study. We reviewed your current medications and made some recommendations to help manage your conditions.  YOUR PLAN:  -ASTHMA: Asthma is a condition where your airways narrow and swell, making it difficult to breathe. Your symptoms have worsened due to high humidity and heat. Continue using your current medications (Breztri , Singulair , and Dupixent). Stay indoors with air conditioning to avoid triggers, and consider using HEPA filters at home. We will follow up in 1-2 months.  -IRON  DEFICIENCY ANEMIA: Iron  deficiency anemia occurs when your body lacks enough iron  to produce healthy red blood cells. We will postpone your EGD and colonoscopy until after your sleep study. We will communicate with gastroenterology to reschedule these procedures.  INSTRUCTIONS:  Please stay indoors with air conditioning to manage your asthma symptoms and consider using HEPA filters at home. Continue with your current asthma medications. We will follow up in 1-2 months. Your EGD and colonoscopy will be rescheduled after your sleep study. We will communicate with gastroenterology to arrange this.

## 2024-05-04 NOTE — Progress Notes (Signed)
 Richard [name]    [id number]    21-Feb-1961  Primary Care Physician:Hoffman, Dayton [name], DO  Referring Physician: Rosan Dayton [name], DO [address], [address] [place],  [place] [address]  Chief complaint:  Follow-up for severe persistent asthma, COPD Previously on Nucala .  Started Dupixent in May 2025 Needs preop clearance for knee surgery  HPI: 63 y.o. 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  Discussed the use of AI scribe software for clinical note transcription with the patient, who gave verbal consent to proceed.  History of Present Illness Richard [name] is a 64 year old male with severe persistent asthma, COPD, and diabetes.   He has severe persistent asthma and COPD. He uses an albuterol  inhaler and solution, and Breztri  twice daily. He was previously on Nucala  but changed to Dupixent. He started Dupixent in May 2025 for eczema, which is affecting his back and chest Dupixent as prescribed by his dermatologist. He experiences increased shortness of breath today due to humidity, with chest tightness and wheezing. He is also on Singulair . He is allergic to pollen, which exacerbates his asthma symptoms.   Admitted on 04/01/2024 for asthma exacerbation. During his hospital stay, he was initially placed on BiPAP and then  weaned to oxygen . He was discharged with prednisone , azithromycin , and nebulizers (Duoneb). He is currently taking prednisone  40 mg daily for four more days. He also has chest pain radiating to his right arm and back, which has improved since hospitalization but persists. He was given ibuprofen  for this pain. His chest x-ray and cardiac workup, including troponins and EKG, were unremarkable.  Interim History Richard [name] is a 63 year old male with asthma who presents with worsening symptoms due to weather changes.  Asthma exacerbation - Worsening asthma symptoms associated with recent humidity and high temperatures - Increased use of nebulizer up to three times daily - Increased use of rescue inhaler beyond baseline frequency - Staying indoors alleviates symptoms; outdoor exposure triggers chest tightness and limits ability to attend appointments and perform daily activities - Current asthma regimen includes Breztri , Singulair , and Dupixent (administered every two weeks) - Dupixent provides some benefit, but extreme weather reduces medication effectiveness  Environmental allergen exposure - Concern for potential environmental triggers at home, including dust and mold - Recent vent cleaning performed to reduce allergen exposure - Considering use of HEPA filters to improve indoor air quality  Iron  deficiency anemia - Iron  deficiency anemia identified after low iron  levels - EGD, Colonoscopy scheduled next month for further evaluation  Sleep-disordered breathing - Sleep study scheduled for September  Relevant Pulmonary history: Pets: No pets Occupation: On disability.  Used to work as a Estate agent Exposures: No mold, hot tub, Financial controller.  No feather pillows or comforters No h/o chemo/XRT/amiodarone/macrodantin/MTX  No exposure to asbestos, silica or other organic allergens  Smoking history: 45-pack-year smoker.  Quit in 2023 Travel history: Originally from [place] .  No  significant recent travel Relevant family history: No family history  of lung disease  Outpatient Encounter Medications as of 05/04/2024  Medication Sig   Accu-Chek Softclix Lancets lancets 1 each by Other route See admin instructions. 1-2 times daily   albuterol  (PROVENTIL ) (2.5 MG/3ML) 0.083% nebulizer solution Take 3 mLs (2.5 mg total) by nebulization 2 (two) times daily.   albuterol  (VENTOLIN  HFA) 108 (90 Base) MCG/ACT inhaler Inhale 1-2 puffs into the lungs every 4 (four) hours as needed for wheezing or shortness of breath.   aspirin  EC 81 MG EC tablet Take 1 tablet (81 mg total) by mouth daily.   atorvastatin  (LIPITOR) 40 MG tablet Take 40 mg by mouth daily.   Budeson-Glycopyrrol-Formoterol  (BREZTRI  AEROSPHERE) 160-9-4.8 MCG/ACT AERO Inhale 2 puffs into the lungs in the morning and at bedtime.   cetirizine  (ZYRTEC  ALLERGY) 10 MG tablet Take 1 tablet (10 mg total) by mouth daily.   clopidogrel  (PLAVIX ) 75 MG tablet Take 1 tablet (75 mg total) by mouth daily.   Continuous Glucose Sensor (FREESTYLE LIBRE 3 SENSOR) MISC Place 1 sensor on the skin every 14 days. Use to check glucose continuously.   diphenhydrAMINE  (BENADRYL  ALLERGY) 25 mg capsule Take 1 capsule (25 mg total) by mouth at bedtime as needed for allergies or itching.   Dulaglutide  (TRULICITY ) 4.5 MG/0.5ML SOAJ Inject 4.5 mg as directed once a week.   DUPIXENT 300 MG/2ML SOAJ Inject 300 mg into the skin every 14 (fourteen) days.   fluticasone  (FLONASE ) 50 MCG/ACT nasal spray Place 1 spray into both nostrils as needed for allergies.   glucose blood test strip 1 each by Other route in the morning and at bedtime.   ibuprofen  (ADVIL ) 600 MG tablet Take 1 tablet (600 mg total) by mouth 3 (three) times daily.   insulin  degludec (TRESIBA ) 100 UNIT/ML FlexTouch Pen Inject 20 Units into the skin daily.   Insulin  Pen Needle (PEN NEEDLES) 32G X 4 MM MISC Use 1 each daily as directed.   ipratropium-albuterol  (DUONEB) 0.5-2.5 (3) MG/3ML SOLN Use  1 vial (3 mLs) by nebulization every 6 (six) hours as needed.   montelukast  (SINGULAIR ) 10 MG tablet Take 1 tablet (10 mg total) by mouth at bedtime.   pioglitazone  (ACTOS ) 30 MG tablet Take 30 mg by mouth daily.   pregabalin  (LYRICA ) 100 MG capsule Take 1 capsule (100 mg total) by mouth 3 (three) times daily.   SYNJARDY  12.02-999 MG TABS SMARTSIG:1 Tablet(s) By Mouth Morning-Night   tadalafil  (CIALIS ) 10 MG tablet Take 1 tablet (10 mg total) by mouth as needed for erectile dysfunction.   triamcinolone  cream (KENALOG ) 0.1 % Apply 1 Application topically 2 (two) times daily.   escitalopram  (LEXAPRO ) 5 MG tablet Take 1 tablet (5 mg total) by mouth daily. (Patient not taking: Reported on 05/04/2024)   ferrous sulfate  325 (65 FE) MG tablet Take 1 tablet (325 mg total) by mouth daily. (Patient not taking: Reported on 05/04/2024)   methocarbamol  (ROBAXIN ) 500 MG tablet Take 1 tablet (500 mg total) by mouth every 6 (six) hours. (Patient not taking: Reported on 05/04/2024)   No facility-administered encounter medications on file as of 05/04/2024.    Physical Exam: Blood pressure 120/70, pulse 79, height 6' 3 (1.905 m), weight 230 lb 6.4 oz (104.5 kg), SpO2 100%. Gen:      No acute distress HEENT:  EOMI, sclera anicteric Neck:     No masses; no thyromegaly Lungs:    Clear to auscultation bilaterally; normal respiratory effort CV:         Regular rate and rhythm; no  murmurs Abd:      + bowel sounds; soft, non-tender; no palpable masses, no distension Ext:    No edema; adequate peripheral perfusion Neuro: alert and oriented x 3 Psych: normal mood and affect   Data Reviewed: Imaging: CTA 02/10/2023 No evidence of pulmonary embolism, emphysema, no acute parenchymal lung abnormality  Chest x-ray 03/29/2024-no active cardiopulmonary disease I have reviewed the images personally.  PFTs: 12/07/2022 FVC 3.37 [64%], FEV1 2.48 [63%],/F73, TLC 6.35 [83%], DLCO 20.08 [67%] Air trapping, bronchodilator  response suggestive of asthma.  Mild diffusion impairment  Labs: CBC 02/14/2024-WBC 4.9, eos 10%, absolute eosinophil count 419 RAST panel 02/21/2018-IgE 886, multiple allergies noted to dog, dust mite, mold, pollen Assessment & Plan Severe persistent asthma, COPD Asthma exacerbation due to high humidity and heat, with increased use of inhaler and nebulizer. Symptoms improve indoors with air conditioning. No wheezing present. Dupixent may take time to build up effectiveness, currently on it for 2-3 months. Previous hospitalization in June for asthma exacerbation. No need for prednisone  at this time due to absence of wheezing and potential side effects like hyperglycemia. - Continue current asthma medications including Breztri , Singulair  - Continue Dupixent 300 mg every 14 days.  Managed by dermatology who started it for severe atopic eczema - Advise to stay indoors with air conditioning to avoid humidity and heat. - Consider HEPA filters for home ventilation system. - Schedule follow-up in 1-2 months with provider or nurse practitioner.  Obstructive sleep apnea (OSA) Previous sleep study and sleep consult with Dr. Buck, [facility] neurology was missed due to hospitalization. Requires rescheduling for proper evaluation and management, especially before undergoing any elective procedures. Emphasized the importance of evaluating for sleep apnea before any surgery due to potential risks associated with untreated sleep apnea during anesthesia. - Follow-up sleep study scheduled for September 2025  Iron  deficiency anemia Iron  deficiency anemia with previous low iron  levels. Scheduled for EGD and colonoscopy to investigate potential sources of bleeding. Discussion about postponing procedures until after sleep study due to need for sedation. - Postpone EGD and colonoscopy until after sleep study is completed. - Communicate with gastroenterology to reschedule procedures.  Eczema - Continue Dupixent per  the allergy for eczema management  Preop evaluation for knee surgery and colonoscopy Suggest that we hold off on elective procedures until his asthma is under better control and he has been fully evaluated for sleep disorders and started on NIV as needed.  Return to clinic in 4 to 8 weeks to reevaluate  Recommendations: Continue Breztri , Singulair  Continue Dupixent.   Follow results of sleep study  Lonna Coder MD [facility] 05/04/2024, 1:27 PM  CC: Rosan Dayton [name], DO

## 2024-05-05 ENCOUNTER — Telehealth: Payer: Self-pay

## 2024-05-05 NOTE — Telephone Encounter (Signed)
-----   Message from [place] D. Zehr sent at 05/05/2024  9:19 AM EDT ----- Patient is scheduled for the end of August with Dr. Nandigam.  Pulmonary is recommending holding off on his procedures until after his sleep study and started on treatment which is in September.  I guess we need to go ahead and cancel his procedures and probably just bring him in for another appointment maybe the end of September and get him rescheduled for procedures at that point.  Powell is off this week.  Thank you,  Jess ----- Message ----- From: Mannam, Praveen, MD Sent: 05/04/2024   1:41 PM EDT To: Jessica D Zehr, PA-C; Kavitha Nandigam V, MD  I see he is scheduled for EGD and colonoscopy next month.  We are evaluating him for severe persistent asthma and undiagnosed sleep apnea and holding off on clearing him for knee surgery until he gets his sleep study (scheduled for September).  It might be better to also do the GI procedure until he gets his sleep study done and started on treatment.

## 2024-05-05 NOTE — Telephone Encounter (Signed)
 The pt appt has been cancelled and he prefers to wait and call back  to make follow up after his sleep study is completed.

## 2024-05-07 ENCOUNTER — Other Ambulatory Visit: Payer: Self-pay

## 2024-05-07 ENCOUNTER — Other Ambulatory Visit: Payer: Self-pay | Admitting: Student

## 2024-05-07 NOTE — Telephone Encounter (Signed)
 Per Dr. Jiles 05/04/24 ov note:  Preop evaluation for knee surgery and colonoscopy Suggest that we hold off on elective procedures until his asthma is under better control and he has been fully evaluated for sleep disorders and started on NIV as needed.  Return to clinic in 4 to 8 weeks to reevaluate    Pt is now scheduled for 07/02/24

## 2024-05-08 NOTE — Telephone Encounter (Signed)
 Attempted to call no answer and no VM set up, I dont really see much documented about why he is on ibuprofen  last refilled on July 8th at visit with Dr Amoako. I think this was added at the hospitalization for some costochondritis but I want to see if this has resolved.

## 2024-05-11 ENCOUNTER — Other Ambulatory Visit: Payer: Self-pay

## 2024-05-12 ENCOUNTER — Other Ambulatory Visit (HOSPITAL_COMMUNITY): Payer: Self-pay

## 2024-05-12 ENCOUNTER — Other Ambulatory Visit: Payer: Self-pay

## 2024-05-21 ENCOUNTER — Other Ambulatory Visit (HOSPITAL_COMMUNITY): Payer: Self-pay

## 2024-05-22 ENCOUNTER — Encounter: Payer: Self-pay | Admitting: Pharmacist

## 2024-05-22 ENCOUNTER — Other Ambulatory Visit: Payer: Self-pay

## 2024-05-22 ENCOUNTER — Other Ambulatory Visit (HOSPITAL_COMMUNITY): Payer: Self-pay

## 2024-05-25 ENCOUNTER — Other Ambulatory Visit: Payer: Self-pay

## 2024-05-25 ENCOUNTER — Other Ambulatory Visit: Payer: Self-pay | Admitting: Internal Medicine

## 2024-05-25 DIAGNOSIS — E1151 Type 2 diabetes mellitus with diabetic peripheral angiopathy without gangrene: Secondary | ICD-10-CM

## 2024-05-26 ENCOUNTER — Other Ambulatory Visit: Payer: Self-pay

## 2024-05-26 MED ORDER — FREESTYLE LIBRE 3 PLUS SENSOR MISC
3 refills | Status: DC
  Filled 2024-05-26: qty 6, 90d supply, fill #0
  Filled 2024-05-26: qty 6, 84d supply, fill #0

## 2024-05-27 ENCOUNTER — Encounter: Payer: Self-pay | Admitting: Podiatry

## 2024-05-27 ENCOUNTER — Ambulatory Visit (INDEPENDENT_AMBULATORY_CARE_PROVIDER_SITE_OTHER): Admitting: Podiatry

## 2024-05-27 DIAGNOSIS — B351 Tinea unguium: Secondary | ICD-10-CM

## 2024-05-27 DIAGNOSIS — M79674 Pain in right toe(s): Secondary | ICD-10-CM

## 2024-05-27 DIAGNOSIS — M79675 Pain in left toe(s): Secondary | ICD-10-CM

## 2024-05-27 DIAGNOSIS — E1140 Type 2 diabetes mellitus with diabetic neuropathy, unspecified: Secondary | ICD-10-CM | POA: Diagnosis not present

## 2024-05-27 NOTE — Progress Notes (Signed)
 This patient presents to the office with chief complaint of long thick nails and diabetic feet.  This patient  says there  is  no pain and discomfort in his feet.  This patient says there are long thick painful nails.on his right foot.  These nails are painful walking and wearing shoes.  Patient is a diabetic with amputation left forefoot.  Patient is unable to  self treat his own nails . This patient presents  to the office today for treatment of the  long nails .  He also has painful callus sub 2 right foot.  General Appearance  Alert, conversant and in no acute stress.  Vascular  Dorsalis pedis and posterior tibial  pulses are palpable  right    Temperature is within normal limits  bilaterally   Neurologic  Senn-Weinstein monofilament wire test within normal limits  bilaterally. Muscle power within normal limits bilaterally.  Nails Thick disfigured discolored nails with subungual debris  from hallux to fifth toes right foot.. No evidence of bacterial infection or drainage bilaterally.  Orthopedic  No limitations of motion of motion feet .  No crepitus or effusions noted.  No bony pathology or digital deformities noted. TMA left foot.  Skin  normotropic skin with noted bilaterally.  No signs of infections or ulcers noted.   Porokeratosis sub 2 right foot.  Onychomycosis  Diabetes with no foot complications  Debride nails right foot with nail nipper and dremel tool.   RTC 4 weeks   Cordella Bold DPM

## 2024-06-01 ENCOUNTER — Ambulatory Visit: Admitting: Podiatry

## 2024-06-02 ENCOUNTER — Encounter: Admitting: Gastroenterology

## 2024-06-03 ENCOUNTER — Other Ambulatory Visit: Payer: Self-pay

## 2024-06-04 ENCOUNTER — Other Ambulatory Visit: Payer: Self-pay

## 2024-06-04 ENCOUNTER — Ambulatory Visit (INDEPENDENT_AMBULATORY_CARE_PROVIDER_SITE_OTHER)

## 2024-06-04 VITALS — BP 115/74 | HR 91 | Temp 98.3°F | Ht 75.0 in | Wt 230.6 lb

## 2024-06-04 DIAGNOSIS — Z794 Long term (current) use of insulin: Secondary | ICD-10-CM

## 2024-06-04 DIAGNOSIS — D509 Iron deficiency anemia, unspecified: Secondary | ICD-10-CM | POA: Diagnosis not present

## 2024-06-04 DIAGNOSIS — J4489 Other specified chronic obstructive pulmonary disease: Secondary | ICD-10-CM

## 2024-06-04 DIAGNOSIS — E1151 Type 2 diabetes mellitus with diabetic peripheral angiopathy without gangrene: Secondary | ICD-10-CM

## 2024-06-04 DIAGNOSIS — M1711 Unilateral primary osteoarthritis, right knee: Secondary | ICD-10-CM | POA: Diagnosis not present

## 2024-06-04 DIAGNOSIS — Z7985 Long-term (current) use of injectable non-insulin antidiabetic drugs: Secondary | ICD-10-CM | POA: Diagnosis not present

## 2024-06-04 DIAGNOSIS — I739 Peripheral vascular disease, unspecified: Secondary | ICD-10-CM | POA: Diagnosis not present

## 2024-06-04 DIAGNOSIS — Z1211 Encounter for screening for malignant neoplasm of colon: Secondary | ICD-10-CM

## 2024-06-04 LAB — GLUCOSE, CAPILLARY: Glucose-Capillary: 107 mg/dL — ABNORMAL HIGH (ref 70–99)

## 2024-06-04 LAB — POCT GLYCOSYLATED HEMOGLOBIN (HGB A1C): HbA1c, POC (controlled diabetic range): 6.5 % (ref 0.0–7.0)

## 2024-06-04 MED ORDER — TRULICITY 4.5 MG/0.5ML ~~LOC~~ SOAJ: 4.5000 mg | SUBCUTANEOUS | 3 refills | Status: DC

## 2024-06-04 MED ORDER — SYNJARDY 12.5-1000 MG PO TABS: 1.0000 | ORAL_TABLET | Freq: Two times a day (BID) | ORAL | 5 refills | Status: AC

## 2024-06-04 MED ORDER — ATORVASTATIN CALCIUM 40 MG PO TABS: 40.0000 mg | ORAL_TABLET | Freq: Every day | ORAL | 5 refills | Status: AC

## 2024-06-04 MED ORDER — ALBUTEROL SULFATE HFA 108 (90 BASE) MCG/ACT IN AERS
1.0000 | INHALATION_SPRAY | RESPIRATORY_TRACT | 5 refills | Status: DC | PRN
  Filled 2024-06-26: qty 8.5, 17d supply, fill #0
  Filled 2024-07-16: qty 8.5, 17d supply, fill #1
  Filled 2024-08-03: qty 8.5, 17d supply, fill #2
  Filled 2024-08-31: qty 8.5, 17d supply, fill #3
  Filled 2024-09-24: qty 6.7, 14d supply, fill #4
  Filled 2024-09-24: qty 8.5, 17d supply, fill #4

## 2024-06-04 NOTE — Addendum Note (Signed)
 Addended by: FRANCESCO FALLOW B on: 06/04/2024 03:17 PM   Modules accepted: Level of Service

## 2024-06-04 NOTE — Progress Notes (Signed)
 Established Patient Office Visit  Subjective   Patient ID: Richard [name], male    DOB: 10-02-1961  Age: 63 y.o. MRN: [id number]  Chief Complaint  Patient presents with   Follow-up   Medication Refill    Albuerol    Referral    Ophthalmologist     Richard [name] is a 63 year old male with a past medical history of type 2 diabetes mellitus, PAD, hypertension, asthma-COPD overlap, diabetic neuropathy, IDA, MDD who presents to the clinic today for an A1c check and diabetes management.  He states he is feeling well overall.  He has been taking all of his medications as prescribed and has no complaints other than his chronic right knee pain for which he needs a knee replacement.  He would like a medication refill on all of his medicines.  Please see plan below for more details.    Review of Systems  Constitutional:  Negative for chills, fever and weight loss.  Respiratory:  Negative for shortness of breath.   Cardiovascular:  Negative for chest pain.  Gastrointestinal:  Negative for abdominal pain, blood in stool, constipation, diarrhea, nausea and vomiting.  Neurological: Negative.   Psychiatric/Behavioral: Negative.       Objective:     BP 115/74 (BP Location: Right Arm, Patient Position: Sitting, Cuff Size: Normal)   Pulse 91   Temp 98.3 F (36.8 C) (Oral)   Wt 230 lb 9.6 oz (104.6 kg)   SpO2 97%   BMI 28.82 kg/m    Physical Exam Constitutional:      Appearance: Normal appearance.  HENT:     Head: Normocephalic and atraumatic.  Eyes:     Extraocular Movements: Extraocular movements intact.  Cardiovascular:     Rate and Rhythm: Normal rate and regular rhythm.     Pulses: Normal pulses.     Heart sounds: Normal heart sounds.  Pulmonary:     Effort: Pulmonary effort is normal. No respiratory distress.     Breath sounds: Normal breath sounds.  Abdominal:     General: Abdomen is flat. Bowel sounds are normal.     Palpations: Abdomen is soft.  Musculoskeletal:         General: No swelling.     Comments: Ambulates with cane. Patient has amputation of left toes. DP/PT pulses intact bilaterally.  Skin:    General: Skin is warm and dry.  Neurological:     General: No focal deficit present.     Mental Status: He is alert and oriented to person, place, and time.  Psychiatric:        Mood and Affect: Mood normal.        Behavior: Behavior normal.     Comments: pleasant       Assessment & Plan:   Patient seen with Dr. MICAEL Riis Winfrey.   Type 2 Diabetes Mellitus with peripheral artery disease Last A1c in 02/2024 was 7.6%. Today it is improving to 6.5%. The patient shows great improvement with his current regimen of Tresiba , Synjardy , and Trulicity . Patient has a history of diabetic retinopathy and had a referral to ophthalmology during his last visit with Dr. Amoako on 04/14/24.  Will ensure patient sees ophthalmology for eye exam as he is noticing increased blurriness in both eyes over the last 2 to 3 months.  Last lipid panel was done 02/2024, showing LDL 113 and total cholesterol 204.  Would like tighter control of his lipids, will recheck today.  I am anticipating that these will also be  improved given the patient's improvement in A1c. --Amb referral to ophthalmology with appointment on 06/24/2024 @ 9:40 am with Dr Selinda Slocumb  -Lipid panel -Continue Synjardy  12.02-999 twice daily, Trulicity  4.5mg  weekly, and Tresiba  20u daily - Continue Atorvastatin  40 mg daily - Follow-up appointment in 2-2.5 months with me or Dr. Rosan per patient preference  Screening for Colonoscopy Patient overdue for colonoscopy exam since 01/2023.  Patient had previously set up appointment with gastroenterology, but canceled this appointment as he is need to prioritize his sleep study first.  Discussed with patient the importance of colonoscopy screenings, patient is amenable to getting colonoscopy done later this year once a sleep study completed.  Will send referral to  [facility]. - Ambulatory referral to gastroenterology  Iron  Deficiency Anemia Chronic, stable. Patient reports he is on his iron  supplement therapy and has received 2u pRBCs via infusions recently. He would like to check his iron  panel today to ensure his anemia is improving, as he believes it is.  -- Iron , TIBC, and ferritin panel  Osteoarthritis of Right Knee Patient reporting increased pain for knee that improves with ibuprofen  use. His pain is not responsive to Tylenol , Voltaren  gel, or steroid injections.  He does not prefer steroid injections, as he does not want to increase his blood sugars.  He notices this trend every time he received steroids.  Discussed the risks and benefits of NSAID use given his history of coronary artery disease with stents in the past. Stenting was done in 2016, showing 10% occlusion.  He is currently on DAPT therapy for PAD, and has been on this for quite some time and may not necessarily need to be on it longitudinally.  Patient will need to confer with his surgeon about stopping Plavix .  Discussed with patient that our preference is to not have him take NSAIDs regularly, as it can increase his risk for bleeding or stomach ulcers given concurrent aspirin  and Plavix  use. Informed patient to trial naproxen  over ibuprofen  as it is a safer NSAID for patient's with coronary disease. Discussed signs of GI bleed to be wary of, including melena, hematochezia, or hematemesis and to stop using NSAIDs if he notices these signs. Patient voiced understanding.   Asthma-COPD Overlap Chronic, stable. Patient reports no bothersome symptoms like shortness of breath, wheezing, or air hunger.  Will refill albuterol  inhaler so patient has plenty on hand for the upcoming fall season. - Albuterol  (Ventolin ) inhaler   Jemery Stacey, DO Internal Medicine Resident, PGY-1 [facility] 9:47 AM 06/04/2024

## 2024-06-04 NOTE — Patient Instructions (Addendum)
 Thank you, [name] for allowing us  to provide your care today. Today we discussed your diabetes, anemia, and need for medication refills.    Refilled medications: -Atorvastatin  40mg  -Albuterol  Inhaler -Trulicity  weekly -Synjardy  12.5-1000mg  -Tresiba  20u daily  I have ordered the following labs for you: -Hgb A1c -Iron  panel -Lipid panel to check your cholesterol     I will call if any are abnormal. All of your labs can be accessed through My Chart.   My Chart Access: [email or web address]  Please follow-up in: 2-2.5 months to follow up on your diabetes    We look forward to seeing you next time. Please call our clinic at [phone] if you have any questions or concerns. The best time to call is Monday-Friday from 9am-4pm, but there is someone available 24/7. If after hours or the weekend, call the main hospital number and ask for the Internal Medicine Resident On-Call. If you need medication refills, please notify your pharmacy one week in advance and they will send us  a request.   Thank you for letting us  take part in your care. Wishing you the best!  Richard Tutson, DO 06/04/2024  [name]

## 2024-06-04 NOTE — Progress Notes (Signed)
 Internal Medicine Clinic Attending  I was physically present during the key portions of the resident provided service and participated in the medical decision making of patient's management care. I reviewed pertinent patient test results.  The assessment, diagnosis, and plan were formulated together and I agree with the documentation in the resident's note.  Francesco Elsie NOVAK, MD

## 2024-06-05 ENCOUNTER — Ambulatory Visit: Payer: Self-pay

## 2024-06-05 LAB — IRON,TIBC AND FERRITIN PANEL
Ferritin: 49 ng/mL (ref 30–400)
Iron Saturation: 24 % (ref 15–55)
Iron: 85 ug/dL (ref 38–169)
Total Iron Binding Capacity: 350 ug/dL (ref 250–450)
UIBC: 265 ug/dL (ref 111–343)

## 2024-06-05 LAB — LIPID PANEL
Chol/HDL Ratio: 2.4 ratio (ref 0.0–5.0)
Cholesterol, Total: 161 mg/dL (ref 100–199)
HDL: 66 mg/dL (ref >39)
LDL Chol Calc (NIH): 83 mg/dL (ref 0–99)
Triglycerides: 62 mg/dL (ref 0–149)
VLDL Cholesterol Cal: 12 mg/dL (ref 5–40)

## 2024-06-05 NOTE — Progress Notes (Signed)
 Lipid panel showing improvement of LDL to 83 and total cholesterol 161. Triglycerides markedly improved to 62. A1c improved to 6.5%. Called and spoke with patient to remain on atorvastatin , hypoglycemic agents, and lifestyle modifications, as they are working well for him. Iron  studies also normalizing after transfusions with [facility]. Encouraged continuation of iron  supplementation.

## 2024-06-19 ENCOUNTER — Other Ambulatory Visit: Payer: Self-pay

## 2024-06-23 ENCOUNTER — Emergency Department (HOSPITAL_COMMUNITY)

## 2024-06-23 ENCOUNTER — Telehealth: Payer: Self-pay | Admitting: Podiatry

## 2024-06-23 ENCOUNTER — Other Ambulatory Visit: Payer: Self-pay

## 2024-06-23 ENCOUNTER — Emergency Department (HOSPITAL_COMMUNITY)
Admission: EM | Admit: 2024-06-23 | Discharge: 2024-06-23 | Disposition: A | Discharge status: Home / Self Care | Attending: Emergency Medicine | Admitting: Emergency Medicine

## 2024-06-23 ENCOUNTER — Encounter (HOSPITAL_COMMUNITY): Payer: Self-pay

## 2024-06-23 ENCOUNTER — Ambulatory Visit: Admitting: Podiatry

## 2024-06-23 DIAGNOSIS — E11621 Type 2 diabetes mellitus with foot ulcer: Secondary | ICD-10-CM | POA: Diagnosis not present

## 2024-06-23 DIAGNOSIS — J441 Chronic obstructive pulmonary disease with (acute) exacerbation: Secondary | ICD-10-CM | POA: Insufficient documentation

## 2024-06-23 DIAGNOSIS — R0602 Shortness of breath: Secondary | ICD-10-CM | POA: Diagnosis not present

## 2024-06-23 DIAGNOSIS — I10 Essential (primary) hypertension: Secondary | ICD-10-CM | POA: Diagnosis not present

## 2024-06-23 DIAGNOSIS — L97519 Non-pressure chronic ulcer of other part of right foot with unspecified severity: Secondary | ICD-10-CM | POA: Diagnosis not present

## 2024-06-23 DIAGNOSIS — L84 Corns and callosities: Secondary | ICD-10-CM | POA: Diagnosis not present

## 2024-06-23 DIAGNOSIS — J45909 Unspecified asthma, uncomplicated: Secondary | ICD-10-CM

## 2024-06-23 DIAGNOSIS — Z794 Long term (current) use of insulin: Secondary | ICD-10-CM | POA: Diagnosis not present

## 2024-06-23 DIAGNOSIS — Z7982 Long term (current) use of aspirin: Secondary | ICD-10-CM | POA: Insufficient documentation

## 2024-06-23 DIAGNOSIS — Z7902 Long term (current) use of antithrombotics/antiplatelets: Secondary | ICD-10-CM | POA: Insufficient documentation

## 2024-06-23 LAB — BASIC METABOLIC PANEL WITH GFR
Anion gap: 8 (ref 5–15)
BUN: 17 mg/dL (ref 8–23)
CO2: 24 mmol/L (ref 22–32)
Calcium: 9 mg/dL (ref 8.9–10.3)
Chloride: 104 mmol/L (ref 98–111)
Creatinine, Ser: 1.24 mg/dL (ref 0.61–1.24)
GFR, Estimated: >60 mL/min (ref >60)
Glucose, Bld: 108 mg/dL — ABNORMAL HIGH (ref 70–99)
Potassium: 5 mmol/L (ref 3.5–5.1)
Sodium: 136 mmol/L (ref 135–145)

## 2024-06-23 LAB — CBC
HCT: 42.4 % (ref 39.0–52.0)
Hemoglobin: 13.1 g/dL (ref 13.0–17.0)
MCH: 25.6 pg — ABNORMAL LOW (ref 26.0–34.0)
MCHC: 30.9 g/dL (ref 30.0–36.0)
MCV: 83 fL (ref 80.0–100.0)
Platelets: 266 K/uL (ref 150–400)
RBC: 5.11 MIL/uL (ref 4.22–5.81)
RDW: 17.5 % — ABNORMAL HIGH (ref 11.5–15.5)
WBC: 5 K/uL (ref 4.0–10.5)
nRBC: 0 % (ref 0.0–0.2)

## 2024-06-23 LAB — TROPONIN I (HIGH SENSITIVITY)
Troponin I (High Sensitivity): 5 ng/L (ref <18)
Troponin I (High Sensitivity): 5 ng/L (ref <18)

## 2024-06-23 MED ORDER — DOXYCYCLINE HYCLATE 100 MG PO CAPS: 100.0000 mg | ORAL_CAPSULE | Freq: Two times a day (BID) | ORAL | 0 refills | Status: DC

## 2024-06-23 MED ORDER — ALBUTEROL SULFATE HFA 108 (90 BASE) MCG/ACT IN AERS: 1.0000 | INHALATION_SPRAY | RESPIRATORY_TRACT | Status: DC | PRN

## 2024-06-23 MED ORDER — METHYLPREDNISOLONE SODIUM SUCC 125 MG IJ SOLR
125.0000 mg | Freq: Once | INTRAMUSCULAR | Status: AC
  Administered 2024-06-23: 125 mg via INTRAVENOUS
  Filled 2024-06-23: qty 2

## 2024-06-23 MED ORDER — DOXYCYCLINE HYCLATE 100 MG PO TABS
100.0000 mg | ORAL_TABLET | Freq: Once | ORAL | Status: AC
  Administered 2024-06-23: 100 mg via ORAL
  Filled 2024-06-23: qty 1

## 2024-06-23 MED ORDER — IPRATROPIUM-ALBUTEROL 0.5-2.5 (3) MG/3ML IN SOLN
3.0000 mL | Freq: Once | RESPIRATORY_TRACT | Status: AC
  Administered 2024-06-23: 3 mL via RESPIRATORY_TRACT
  Filled 2024-06-23: qty 3

## 2024-06-23 MED ORDER — IPRATROPIUM-ALBUTEROL 0.5-2.5 (3) MG/3ML IN SOLN
3.0000 mL | Freq: Three times a day (TID) | RESPIRATORY_TRACT | Status: DC
  Filled 2024-06-23: qty 3

## 2024-06-23 NOTE — ED Triage Notes (Signed)
 Pt is here for evaluation of COPD exacerbation, pt is tachypneic and states that he has been having increasing sob.  No CP.  Pt would also like his diabetic foot ulcer evaluated as it is bleeding.

## 2024-06-23 NOTE — ED Provider Notes (Signed)
 [place] EMERGENCY DEPARTMENT AT [facility] Provider Note   CSN: [id number] Arrival date & time: 06/23/24  1105     Patient presents with: COPD, Shortness of Breath, and Wound Check   Richard [name] is a 63 y.o. male.   Patient complains of shortness of breath.  Patient reports he has a history of asthma and COPD.  Patient complains of being short of breath today.  Patient states that he also has a wound on his foot that started draining today.  Patient states he has had a callus there.  He has been seen by podiatry.  Patient states he has not had any drainage prior to today.  Patient is diabetic.  Patient has a past medical history of type 2 diabetes peripheral artery disease hypertension, aortic stenosis, asthma, COPD, iron  deficiency and eczema.   COPD Associated symptoms include shortness of breath.  Shortness of Breath Wound Check Associated symptoms include shortness of breath.       Prior to Admission medications   Medication Sig Start Date End Date Taking? Authorizing Provider  Accu-Chek Softclix Lancets lancets 1 each by Other route See admin instructions. 1-2 times daily 06/27/23   [provider]  albuterol  (VENTOLIN  HFA) 108 (90 Base) MCG/ACT inhaler Inhale 1-2 puffs into the lungs every 4 (four) hours as needed for wheezing or shortness of breath. 06/04/24   Amilibia, [name], DO  aspirin  EC 81 MG EC tablet Take 1 tablet (81 mg total) by mouth daily. 09/26/15   Burns, Johana SAUNDERS, MD  atorvastatin  (LIPITOR) 40 MG tablet Take 1 tablet (40 mg total) by mouth daily. 06/04/24   Amilibia, [name], DO  Budeson-Glycopyrrol-Formoterol  (BREZTRI  AEROSPHERE) 160-9-4.8 MCG/ACT AERO Inhale 2 puffs into the lungs in the morning and at bedtime. 12/05/22   Gladis Leonor HERO, MD  cetirizine  (ZYRTEC  ALLERGY) 10 MG tablet Take 1 tablet (10 mg total) by mouth daily. 01/20/24 01/19/25  Kandis Perkins, DO  clopidogrel  (PLAVIX ) 75 MG tablet Take 1 tablet (75 mg total) by mouth daily.  03/05/24   Rosan Dayton [name], DO  Continuous Glucose Sensor (FREESTYLE LIBRE 3 PLUS SENSOR) MISC Place 1 sensor on the skin every 15 days. Use to check glucose continuously. 05/26/24   Rosan Dayton [name], DO  diphenhydrAMINE  (BENADRYL  ALLERGY) 25 mg capsule Take 1 capsule (25 mg total) by mouth at bedtime as needed for allergies or itching. 01/20/24   Kandis Perkins, DO  Dulaglutide  (TRULICITY ) 4.5 MG/0.5ML SOAJ Inject 4.5 mg as directed once a week. 06/04/24   Amilibia, [name], DO  DUPIXENT 300 MG/2ML SOAJ Inject 300 mg into the skin every 14 (fourteen) days. 02/11/24   [provider]  escitalopram  (LEXAPRO ) 5 MG tablet Take 1 tablet (5 mg total) by mouth daily. 03/05/24   Rosan Dayton [name], DO  ferrous sulfate  325 (65 FE) MG tablet Take 1 tablet (325 mg total) by mouth daily. 06/19/23 06/18/24  Tobie Gaines, DO  fluticasone  (FLONASE ) 50 MCG/ACT nasal spray Place 1 spray into both nostrils as needed for allergies. 03/05/24   Rosan Dayton C, DO  glucose blood test strip 1 each by Other route in the morning and at bedtime. 08/05/23   [provider]  ibuprofen  (ADVIL ) 600 MG tablet Take 1 tablet (600 mg total) by mouth 3 (three) times daily. 04/14/24   Amoako, Prince, MD  insulin  degludec (TRESIBA ) 100 UNIT/ML FlexTouch Pen Inject 20 Units into the skin daily. 03/05/24   Rosan Dayton [name], DO  Insulin  Pen Needle (PEN NEEDLES) 32G  X 4 MM MISC Use 1 each daily as directed. 03/05/24   Rosan Dayton [name], DO  ipratropium-albuterol  (DUONEB) 0.5-2.5 (3) MG/3ML SOLN Use 1 vial (3 mLs) by nebulization every 6 (six) hours as needed. 03/05/24   Rosan Dayton [name], DO  methocarbamol  (ROBAXIN ) 500 MG tablet Take 1 tablet (500 mg total) by mouth every 6 (six) hours. 04/01/24   Amoako, Prince, MD  montelukast  (SINGULAIR ) 10 MG tablet Take 1 tablet (10 mg total) by mouth at bedtime. 03/05/24   Rosan Dayton [name], DO  pioglitazone  (ACTOS ) 30 MG tablet Take 30 mg by mouth daily. 02/26/24   [provider]  pregabalin  (LYRICA )  100 MG capsule Take 1 capsule (100 mg total) by mouth 3 (three) times daily. 03/05/24   Rosan Dayton [name], DO  SYNJARDY  12.02-999 MG TABS Take 1 tablet by mouth 2 (two) times daily. 06/04/24   Amilibia, [name], DO  tadalafil  (CIALIS ) 10 MG tablet Take 1 tablet (10 mg total) by mouth as needed for erectile dysfunction. 04/14/24 04/14/25  Renne Homans, MD  triamcinolone  cream (KENALOG ) 0.1 % Apply 1 Application topically 2 (two) times daily. 01/20/24   Kandis Perkins, DO    Allergies: Shellfish allergy    Review of Systems  Respiratory:  Positive for shortness of breath.   All other systems reviewed and are negative.   Updated Vital Signs BP 139/81   Pulse 72   Temp 98 F (36.7 C) (Axillary)   Resp 15   Ht 6' 3 (1.905 m)   Wt 108.9 kg   SpO2 100%   BMI 30.00 kg/m   Physical Exam Vitals and nursing note reviewed.  Constitutional:      Appearance: He is well-developed.  HENT:     Head: Normocephalic.     Mouth/Throat:     Mouth: Mucous membranes are moist.     Comments: Patient making loud wheezing noises,  Pt was able to stop with breathing through his nose.  Cardiovascular:     Rate and Rhythm: Normal rate and regular rhythm.  Pulmonary:     Effort: Pulmonary effort is normal.     Breath sounds: Wheezing and rhonchi present.  Abdominal:     General: There is no distension.  Musculoskeletal:        General: Normal range of motion.     Cervical back: Normal range of motion and neck supple.     Comments: Patient has a callus on the ball of his right foot, there is a small opening with foul-smelling drainage, patient has good pulses neurosensory intact  Skin:    General: Skin is warm.  Neurological:     General: No focal deficit present.     Mental Status: He is alert and oriented to person, place, and time.  Psychiatric:        Mood and Affect: Mood normal.     (all labs ordered are listed, but only abnormal results are displayed) Labs Reviewed  BASIC METABOLIC PANEL WITH  GFR - Abnormal; Notable for the following components:      Result Value   Glucose, Bld 108 (*)    All other components within normal limits  CBC - Abnormal; Notable for the following components:   MCH 25.6 (*)    RDW 17.5 (*)    All other components within normal limits  TROPONIN I (HIGH SENSITIVITY)  TROPONIN I (HIGH SENSITIVITY)    EKG: EKG Interpretation Date/Time:  Tuesday June 23 2024 11:21:30 EDT Ventricular Rate:  93 PR  Interval:  172 QRS Duration:  82 QT Interval:  340 QTC Calculation: 422 R Axis:   54  Text Interpretation: Normal sinus rhythm Early repolarization Normal ECG No significant change since last tracing Confirmed by Jerrol Agent ([id number]) on 06/23/2024 7:20:49 PM  Radiology: [facility]COLA Foot Complete Right Result Date: 06/23/2024 CLINICAL DATA:  Foot ulcer bottom of foot EXAM: RIGHT FOOT COMPLETE - 3+ VIEW COMPARISON:  02/28/2024 FINDINGS: No acute bony abnormality. Specifically, no fracture, subluxation, or dislocation. No bone destruction to suggest osteomyelitis. Joint spaces maintained. Vascular calcifications are noted. Soft tissues are intact. IMPRESSION: No acute bony abnormality. Electronically Signed   By: Franky Crease M.D.   On: 06/23/2024 13:02   DG Chest 2 View Result Date: 06/23/2024 CLINICAL DATA:  Shortness of breath. EXAM: CHEST - 2 VIEW COMPARISON:  03/29/2024. FINDINGS: The heart size and mediastinal contours are unchanged. No pulmonary edema, focal consolidation, pleural effusion, or pneumothorax. No acute osseous abnormality. IMPRESSION: No acute cardiopulmonary findings. Electronically Signed   By: Harrietta Sherry M.D.   On: 06/23/2024 12:03     Procedures   Medications Ordered in the ED  albuterol  (VENTOLIN  HFA) 108 (90 Base) MCG/ACT inhaler 1-2 puff (has no administration in time range)  ipratropium-albuterol  (DUONEB) 0.5-2.5 (3) MG/3ML nebulizer solution 3 mL (has no administration in time range)  ipratropium-albuterol  (DUONEB) 0.5-2.5 (3)  MG/3ML nebulizer solution 3 mL (3 mLs Nebulization Given 06/23/24 1304)  ipratropium-albuterol  (DUONEB) 0.5-2.5 (3) MG/3ML nebulizer solution 3 mL (3 mLs Nebulization Given 06/23/24 1709)  doxycycline  (VIBRA -TABS) tablet 100 mg (100 mg Oral Given 06/23/24 1925)  methylPREDNISolone  sodium succinate (SOLU-MEDROL ) 125 mg/2 mL injection 125 mg (125 mg Intravenous Given 06/23/24 1925)                                    Medical Decision Making Patient complains of shortness of breath and an ulcer on his foot.  Patient reports the ulcer began draining today.  He has been seen by podiatry for the same.  Patient has a past medical history of asthma and COPD  Amount and/or Complexity of Data Reviewed Labs: ordered. Decision-making details documented in ED Course.    Details: Labs ordered reviewed and interpreted glucose is 108.Troponin is negative Radiology: ordered and independent interpretation performed. Decision-making details documented in ED Course.    Details: Chest x-ray shows no acute findings x-ray right foot shows no evidence of osteo myelitis.  Risk Prescription drug management. Risk Details: Patient is given DuoNebs x 3, oxygen  saturation is 100% on room air.  Patient is given Solu-Medrol  125 mg IV.  Patient is given a doxycycline  dosage here in the emergency department.  He is given a prescription for doxycycline .  Patient is scheduled to see his podiatrist on Monday.  He is counseled on wound care.  I advised him to call the podiatrist to see if he can be seen sooner.  Patient is advised to follow-up with his primary care physician for asthma and COPD.  Patient is discharged much improved.        Final diagnoses:  COPD exacerbation (HCC)  Diabetic ulcer of right foot associated with type 2 diabetes mellitus, unspecified part of foot, unspecified ulcer stage (HCC)  Moderate asthma, unspecified whether complicated, unspecified whether persistent    ED Discharge Orders           Ordered    doxycycline  (VIBRAMYCIN ) 100 MG capsule  2 times  daily        06/23/24 2007           An After Visit Summary was printed and given to the patient.     Flint Sonny MARLA DEVONNA 06/23/24 2233    Jerrol Agent, MD 06/24/24 KAROLYNN

## 2024-06-23 NOTE — ED Notes (Signed)
Positive pedal pulse in right foot.

## 2024-06-23 NOTE — Telephone Encounter (Signed)
 Pt called in with an emergency (pt states that he has a callous on the right foot thats now a hole in the ball of the foot that has been bleeding painful ) Pt will see Dr. Christine @ 3:15 Pt want me to let you know what is going on with him.

## 2024-06-23 NOTE — ED Provider Triage Note (Signed)
 Emergency Medicine Provider Triage Evaluation Note  Tyray Proch , a 63 y.o. male  was evaluated in triage.  Pt complains of chest tightness, SOB, dry cough for three days. Also one day of right foot blister.   Review of Systems  Positive: SOB Negative: Fever  Physical Exam  BP (!) 145/92 (BP Location: Left Arm)   Pulse 91   Temp 98.3 F (36.8 C)   Resp 18   Ht 6' 3 (1.905 m)   Wt 108.9 kg   SpO2 100%   BMI 30.00 kg/m  Gen:   Awake, no distress, full sentences.  Diffuse wheezing. Resp:  Normal effort  MSK:   Moves extremities without difficulty  Other:    Medical Decision Making  Medically screening exam initiated at 12:18 PM.  Appropriate orders placed.  Mathius Birkeland was informed that the remainder of the evaluation will be completed by another provider, this initial triage assessment does not replace that evaluation, and the importance of remaining in the ED until their evaluation is complete.  DuoNeb ordered. Vitals stable.   Shermon Warren SAILOR, PA-C 06/23/24 1220

## 2024-06-23 NOTE — Discharge Instructions (Addendum)
 Take antibiotic as directed.  Clean wound twice a day and replace with a clean moist dressing.  Wear boot to keep pressure off of wound.  Monitor your temperature and return if you develop a fever.  Use nebulizer every 4 hours for the next 2 days

## 2024-06-23 NOTE — ED Notes (Signed)
Wound cleansed and dressed with bandage.

## 2024-06-24 ENCOUNTER — Other Ambulatory Visit: Payer: Self-pay

## 2024-06-24 ENCOUNTER — Encounter: Payer: Self-pay | Admitting: Podiatry

## 2024-06-24 ENCOUNTER — Ambulatory Visit (INDEPENDENT_AMBULATORY_CARE_PROVIDER_SITE_OTHER): Admitting: Podiatry

## 2024-06-24 DIAGNOSIS — E113212 Type 2 diabetes mellitus with mild nonproliferative diabetic retinopathy with macular edema, left eye: Secondary | ICD-10-CM | POA: Diagnosis not present

## 2024-06-24 DIAGNOSIS — L97512 Non-pressure chronic ulcer of other part of right foot with fat layer exposed: Secondary | ICD-10-CM | POA: Diagnosis not present

## 2024-06-24 DIAGNOSIS — H2513 Age-related nuclear cataract, bilateral: Secondary | ICD-10-CM | POA: Diagnosis not present

## 2024-06-24 DIAGNOSIS — H43823 Vitreomacular adhesion, bilateral: Secondary | ICD-10-CM | POA: Diagnosis not present

## 2024-06-24 DIAGNOSIS — E113311 Type 2 diabetes mellitus with moderate nonproliferative diabetic retinopathy with macular edema, right eye: Secondary | ICD-10-CM | POA: Diagnosis not present

## 2024-06-24 DIAGNOSIS — H40053 Ocular hypertension, bilateral: Secondary | ICD-10-CM | POA: Diagnosis not present

## 2024-06-24 LAB — OPHTHALMOLOGY REPORT-SCANNED

## 2024-06-24 MED ORDER — MUPIROCIN 2 % EX OINT: 1.0000 | TOPICAL_OINTMENT | Freq: Two times a day (BID) | CUTANEOUS | 3 refills | Status: DC

## 2024-06-24 NOTE — Progress Notes (Signed)
 Subjective:  Patient ID: Richard [name], male    DOB: 1960-12-04,  MRN: [id number]  Chief Complaint  Patient presents with   Foot Ulcer    Rm10  right foot ulcration     63 y.o. male presents for wound care.  Presents with ulceration plantar aspect right foot.  With some concern he had some drainage on his sock when he took it off he has had a chronic ulceration of this area for years that is trimmed about every 4 weeks.  No fever or chills or nausea or vomiting.   Review of Systems: Negative except as noted in the HPI. Denies N/V/F/Ch.  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 1-2 times daily, Disp: , Rfl:    albuterol  (VENTOLIN  HFA) 108 (90 Base) MCG/ACT inhaler, Inhale 1-2 puffs into the lungs every 4 (four) hours as needed for wheezing or shortness of breath., Disp: 8.5 g, Rfl: 5   aspirin  EC 81 MG EC tablet, Take 1 tablet (81 mg total) by mouth daily., Disp: 30 tablet, Rfl: 3   atorvastatin  (LIPITOR) 40 MG tablet, Take 1 tablet (40  mg total) by mouth daily., Disp: 30 tablet, Rfl: 5   Budeson-Glycopyrrol-Formoterol  (BREZTRI  AEROSPHERE) 160-9-4.8 MCG/ACT AERO, Inhale 2 puffs into the lungs in the morning and at bedtime., Disp: 10.7 g, Rfl: 11   cetirizine  (ZYRTEC  ALLERGY) 10 MG tablet, Take 1 tablet (10 mg total) by mouth daily., Disp: 30 tablet, Rfl: 2   clopidogrel  (PLAVIX ) 75 MG tablet, Take 1 tablet (75 mg total) by mouth daily., Disp: 90 tablet, Rfl: 3   Continuous Glucose Sensor (FREESTYLE LIBRE 3 PLUS SENSOR) MISC, Place 1 sensor on the skin every 15 days. Use to check glucose continuously., Disp: 6 each, Rfl: 3   diphenhydrAMINE  (BENADRYL  ALLERGY) 25 mg capsule, Take 1 capsule (25 mg total) by mouth at bedtime as needed for allergies or itching., Disp: 30 capsule, Rfl: 0   doxycycline  (VIBRAMYCIN ) 100 MG capsule, Take 1 capsule (100 mg total) by mouth 2 (two) times daily., Disp: 20 capsule, Rfl: 0   Dulaglutide  (TRULICITY ) 4.5 MG/0.5ML SOAJ, Inject 4.5 mg as directed once a week., Disp: 6 mL, Rfl: 3   DUPIXENT 300 MG/2ML SOAJ, Inject 300 mg into the skin every 14 (fourteen) days., Disp: , Rfl:    escitalopram  (LEXAPRO ) 5 MG tablet, Take 1  tablet (5 mg total) by mouth daily., Disp: 90 tablet, Rfl: 3   ferrous sulfate  325 (65 FE) MG tablet, Take 1 tablet (325 mg total) by mouth daily., Disp: 30 tablet, Rfl: 3   fluticasone  (FLONASE ) 50 MCG/ACT nasal spray, Place 1 spray into both nostrils as needed for allergies., Disp: 16 g, Rfl: 11   glucose blood test strip, 1 each by Other route in the morning and at bedtime., Disp: , Rfl:    ibuprofen  (ADVIL ) 600 MG tablet, Take 1 tablet (600 mg total) by mouth 3 (three) times daily., Disp: 90 tablet, Rfl: 0   insulin  degludec (TRESIBA ) 100 UNIT/ML FlexTouch Pen, Inject 20 Units into the skin daily., Disp: 15 mL, Rfl: 4   Insulin  Pen Needle (PEN NEEDLES) 32G X 4 MM MISC, Use 1 each daily as directed., Disp: 100 each, Rfl: 3   ipratropium-albuterol  (DUONEB) 0.5-2.5 (3) MG/3ML SOLN, Use  1 vial (3 mLs) by nebulization every 6 (six) hours as needed., Disp: 360 mL, Rfl: 5   methocarbamol  (ROBAXIN ) 500 MG tablet, Take 1 tablet (500 mg total) by mouth every 6 (six) hours., Disp: 5 tablet, Rfl: 0   montelukast  (SINGULAIR ) 10 MG tablet, Take 1 tablet (10 mg total) by mouth at bedtime., Disp: 90 tablet, Rfl: 3   pioglitazone  (ACTOS ) 30 MG tablet, Take 30 mg by mouth daily., Disp: , Rfl:    pregabalin  (LYRICA ) 100 MG capsule, Take 1 capsule (100 mg total) by mouth 3 (three) times daily., Disp: 90 capsule, Rfl: 5   SYNJARDY  12.02-999 MG TABS, Take 1 tablet by mouth 2 (two) times daily., Disp: 60 tablet, Rfl: 5   tadalafil  (CIALIS ) 10 MG tablet, Take 1 tablet (10 mg total) by mouth as needed for erectile dysfunction., Disp: 20 tablet, Rfl: 1   triamcinolone  cream (KENALOG ) 0.1 %, Apply 1 Application topically 2 (two) times daily., Disp: 30 g, Rfl: 3  Social History   Tobacco Use  Smoking Status Former   Current packs/day: 0.00   Average packs/day: 1 pack/day for 45.0 years (45.0 ttl pk-yrs)   Types: Cigarettes   Start date: 08/22/1977   Quit date: 08/22/2022   Years since quitting: 1.8   Passive exposure: Never  Smokeless Tobacco Never  Tobacco Comments   Quit x 2 weeks.    Allergies  Allergen Reactions   Shellfish Allergy Anaphylaxis and Shortness Of Breath   Objective:  There were no vitals filed for this visit. There is no height or weight on file to calculate BMI. Constitutional Well developed. Well nourished.  Vascular Dorsalis pedis pulses palpable bilaterally. Posterior tibial pulses palpable bilaterally. Capillary refill normal to all digits.  No cyanosis or clubbing noted. Pedal hair growth normal.  Neurologic Normal speech. Oriented to person, place, and time. Protective sensation absent  Dermatologic Wound Location: Plantar forefoot plantar forefoot right Wound Base: Mixed Granular/Fibrotic Peri-wound: Macerated Exudate: Scant/small amount Serous  exudate Wound Measurements: - 2.5 cm x 2.0 cm  Orthopedic: No pain to palpation either foot.    Assessment:  No diagnosis found. Plan:  Patient was evaluated and treated and all questions answered.  Ulcer plantar forefoot right -Debridement as below. -Dressed with Silvadene  cream, DSD. -Wound care: Warm Epsom salt water soaks 15 minutes twice daily, apply Bactroban  ointment, and a light dressing.  Procedure: Excisional Debridement of Wound Tool: Sharp #312 chisel blade/tissue nipper Type of Debridement: Sharp Excisional Frequency: @2  weeks until appropriately healed.  Dressing is to be changed daily/keeping the wound clean and dry Rationale:  Removal of non-viable soft tissue from the wound to promote healing.  Anesthesia: none Pre-Debridement Wound Measurements: 1.5 cm x 1.0 cm x 0.5 cm  Post-Debridement Wound Measurements: 2.5 cm x 2.0 cm x 1.0 cm  Area devitalized tissue removed(nonviable tissue only): 2.5 cm x 2.0 cm.  Blood loss: Minimal (<50cc) Depth of Debridement: with fat layer exposed Description of tissue removed: Devitalized Tissue Technique: The wound and the surrounding skin were prepped and draped in usual aseptic fashion.  Aseptic technique was maintained throughout the procedure.  Using #312 blade/tissue nipper sharp debridement of necrotic/nonviable tissue was performed until healthy bleeding wound bed was achieved.  No underlying bone or tendon was exposed during debridement.  The wound was thoroughly irrigated with normal saline solution Wound Progress:  Current Wound Volume: Debridement was performed of the chronic nonhealing diabetic foot wound on right foot forefoot right.  Debridement removed 2.5 cm x 2.0 cm of the necrotic tissue and subcutaneous tissue and none amount of purulent drainage was not present. Presence/absence of tissue: Necrotic tissue/nonviable tissue present at the base of the wound.  Sharp debridement was performed to remove the necrotic  tissue/nonviable tissue back to viable tissue.  No devitalized/nonviable tissue present postdebridement.  Wound appeared clean and clear of infection No material in the wound was present that was identified to be inhibiting healing. Dressing: Dry, sterile, compression dressing. Disposition: Patient tolerated procedure well. Patient to return in 1 week for follow-up or as listed above.  No follow-ups on file.  Rx Bactroban  ointment apply twice daily to wound

## 2024-06-26 ENCOUNTER — Other Ambulatory Visit: Payer: Self-pay | Admitting: Internal Medicine

## 2024-06-26 ENCOUNTER — Other Ambulatory Visit: Payer: Self-pay

## 2024-06-26 DIAGNOSIS — J4489 Other specified chronic obstructive pulmonary disease: Secondary | ICD-10-CM

## 2024-06-26 NOTE — Telephone Encounter (Signed)
 Pt walks in requesting refill on albuterol  inhaler Pt uses [facility] at [facility] has refill at [facility] CMA contacted [facility] to have rx transferred  Pt aware.Richard Galster Cassady9/19/202510:34 AM

## 2024-06-28 ENCOUNTER — Ambulatory Visit (HOSPITAL_BASED_OUTPATIENT_CLINIC_OR_DEPARTMENT_OTHER): Attending: Pulmonary Disease | Admitting: Pulmonary Disease

## 2024-06-28 VITALS — Ht 75.0 in | Wt 229.0 lb

## 2024-06-28 DIAGNOSIS — G4761 Periodic limb movement disorder: Secondary | ICD-10-CM | POA: Diagnosis not present

## 2024-06-28 DIAGNOSIS — G4752 REM sleep behavior disorder: Secondary | ICD-10-CM | POA: Diagnosis not present

## 2024-06-29 ENCOUNTER — Ambulatory Visit (INDEPENDENT_AMBULATORY_CARE_PROVIDER_SITE_OTHER): Admitting: Podiatry

## 2024-06-29 ENCOUNTER — Encounter: Payer: Self-pay | Admitting: Podiatry

## 2024-06-29 DIAGNOSIS — Z89432 Acquired absence of left foot: Secondary | ICD-10-CM | POA: Diagnosis not present

## 2024-06-29 DIAGNOSIS — E1140 Type 2 diabetes mellitus with diabetic neuropathy, unspecified: Secondary | ICD-10-CM | POA: Diagnosis not present

## 2024-06-29 DIAGNOSIS — L84 Corns and callosities: Secondary | ICD-10-CM | POA: Diagnosis not present

## 2024-06-29 DIAGNOSIS — L97512 Non-pressure chronic ulcer of other part of right foot with fat layer exposed: Secondary | ICD-10-CM

## 2024-06-29 NOTE — Progress Notes (Signed)
 This patient presents to the office with chief complaint of  painful callus ball of right foot.  .  Patient is a diabetic with amputation left forefoot.  Patient is unable to  self treat his own callus. This patient presents  to the office today for treatment of his thick callus. He was seen by Dr.  Christine one week ago and was rescheduled with me to  check on his status.  General Appearance  Alert, conversant and in no acute stress.  Vascular  Dorsalis pedis and posterior tibial  pulses are  palpable  B/L.   Temperature is within normal limits  B/L.  Neurologic  Senn-Weinstein monofilament wire test within normal limit. Muscle power within normal limits   Nails Thick disfigured discolored nails with subungual debris  from hallux to fifth toes right foot.. No evidence of bacterial infection or drainage bilaterally.  Orthopedic  No limitations of motion of motion feet .  No crepitus or effusions noted.  No bony pathology or digital deformities noted. TMA left foot.  Skin  normotropic skin with noted bilaterally.  No signs of infections or ulcers noted.   Porokeratosis sub 2 right foot.  Porokeratosis /Ulcer right foot    His ulcer/porokeratosis has no evidence of drainage.  Chronic fibrotic tissue sub 2 right foot.  RTC 3 weeks per patient.   Cordella Bold DPM

## 2024-07-01 NOTE — Procedures (Signed)
 [facility] [address] [place], [place] [address] Tel: [phone]   Fax: [phone]  Polysomnography Interpretation  Patient Name:  Richard [name], Richard [name] Date:  06/28/2024 Referring Physician:  LONNA CODER [phone]) %%startinterp%% Indications for Polysomnography The patient is a 63 year-old Male who is 6' 3 and weighs 229.0 lbs. His BMI equals 28.8.  A full night polysomnogram was performed to evaluate for -.  Medication  Albuterol  inhaler   Polysomnogram Data A full night polysomnogram recorded the standard physiologic parameters including EEG, EOG, EMG, EKG, nasal and oral airflow.  Respiratory parameters of chest and abdominal movements were recorded with Respiratory Inductance Plethysmography belts.  Oxygen  saturation was recorded by pulse oximetry.   Sleep Architecture The total recording time of the polysomnogram was 449.2 minutes.  The total sleep time was 348.0 minutes.  The patient spent 9.8% of total sleep time in Stage N1, 44.3% in Stage N2, 33.2% in Stages N3, and 12.8% in REM.  Sleep latency was 10.3 minutes.  REM latency was 70.5 minutes.  Sleep Efficiency was 77.5%.  Wake after Sleep Onset time was 91.0 minutes.  Respiratory Events The polysomnogram revealed a presence of 1 obstructive, - central, and - mixed apneas resulting in an Apnea index of 0.2 events per hour.  There were 6 hypopneas (>=3% desaturation and/or arousal) resulting in an Apnea\Hypopnea Index (AHI >=3% desaturation and/or arousal) of 1.2 events per hour.  There were 5 hypopneas (>=4% desaturation) resulting in an Apnea\Hypopnea Index (AHI >=4% desaturation) of 1.0 events per hour.  There were 72 Respiratory Effort Related Arousals resulting in a RERA index of 12.4 events per hour. The Respiratory Disturbance Index is 13.6 events per hour.  The snore index was - events per hour.  Mean oxygen  saturation was 94.3%.  The lowest oxygen  saturation during sleep was  91.0%.  Time spent <=88% oxygen  saturation was - minutes (-).  Limb Activity There were 241 total limb movements recorded, of this total, 220 were classified as PLMs.  PLM index was 37.9 per hour and PLM associated with Arousals index was 4.3 per hour.  Cardiac Summary The average pulse rate was 81.6 bpm.  The minimum pulse rate was 73.0 bpm while the maximum pulse rate was 92.0 bpm.  Cardiac rhythm was normal/abnormal.  Comments: Frequent limb movements and restlessness were observed throughout the study.  During REM periods, the patient showed evidence of REM behavior disorder.  He talked, talked conversationally, rocked his body back and forth at times, moved his hands while talking, and had a full body jerk in which he looked like he was protecting himself from an attack.  He also had abnormal muscle tone observed in all REM periods. Examples, times, descriptions of behaviors in REM and documented epochs as follow: Epoch 350: verbalization in REM Epoch 358: movement and verbalization in REM Epochs 373-381: multiple eposides of body rocking, talking, conversational talking (approx 12:29 AM) Epoch 618-617: louder speech sounded more like argument, more pressured and deliberate (approx 12:32 AM) Zenry611: full body stiffens with lots of muscle tone at 12:38 AM Epoch 395: full body movement in REM (approx 12:38 AM) Epoch 638: arm movements in REM (approx 2:39 AM) The patient snored frequently and snores were moderately loud at times.  He showed an elevated RDI and many RERAs were scored with fewer obstructive apneas and hypopneas observed.  The patient did not meet split night criteria.  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

## 2024-07-02 ENCOUNTER — Encounter: Payer: Self-pay | Admitting: Pulmonary Disease

## 2024-07-02 ENCOUNTER — Ambulatory Visit: Admitting: Pulmonary Disease

## 2024-07-02 VITALS — BP 110/60 | HR 96 | Temp 98.8°F | Ht 75.0 in | Wt 231.0 lb

## 2024-07-02 DIAGNOSIS — J4550 Severe persistent asthma, uncomplicated: Secondary | ICD-10-CM | POA: Diagnosis not present

## 2024-07-02 DIAGNOSIS — J4489 Other specified chronic obstructive pulmonary disease: Secondary | ICD-10-CM

## 2024-07-02 DIAGNOSIS — G4752 REM sleep behavior disorder: Secondary | ICD-10-CM

## 2024-07-02 DIAGNOSIS — G2581 Restless legs syndrome: Secondary | ICD-10-CM | POA: Diagnosis not present

## 2024-07-02 DIAGNOSIS — Z87891 Personal history of nicotine dependence: Secondary | ICD-10-CM | POA: Diagnosis not present

## 2024-07-02 DIAGNOSIS — J4551 Severe persistent asthma with (acute) exacerbation: Secondary | ICD-10-CM

## 2024-07-02 NOTE — Patient Instructions (Signed)
  VISIT SUMMARY: You had a follow-up visit to discuss your respiratory condition and sleep study results. Your asthma and COPD are well-controlled with your current medications, and your sleep study showed no sleep apnea but indicated other sleep-related issues.  YOUR PLAN: SEVERE PERSISTENT ASTHMA AND COPD: Your asthma and COPD are well-controlled with your current medications, and you have not had any recent exacerbations. -Continue taking Dupixent, Breztri , and Singulair  as prescribed.  ECZEMA: Your eczema is being managed effectively with Dupixent. -Continue taking Dupixent as prescribed.  RESTLESS LEGS SYNDROME, PERIODIC LIMB MOVEMENT DISORDER, AND REM SLEEP BEHAVIOR DISORDER: Your sleep study indicates probable restless legs syndrome, periodic limb movement disorder, and REM sleep behavior disorder. These conditions may be contributing to your sleep disturbances. -You will be referred to a sleep specialist for further evaluation and management.  IRON  DEFICIENCY ANEMIA, RESOLVED: Your previous iron  deficiency anemia has resolved, and your blood counts are now normal. This may have contributed to your restless legs syndrome. -No current treatment needed as your blood counts are normal.  KNEE SURGERY AND COLONOSCOPY: You are scheduled for knee surgery and a colonoscopy. The absence of sleep apnea allows you to proceed with surgery, and the colonoscopy is indicated due to your previous iron  deficiency anemia. - Please contact your orthopedic specialist to proceed with knee surgery. -Arrange a referral to a GI specialist for your colonoscopy.

## 2024-07-02 NOTE — Progress Notes (Signed)
 Richard [name]    [id number]    Jun 08, 1961  Primary Care Physician:Hoffman, Dayton [name], DO  Referring Physician: Rosan [place] [name], DO [address], [address] [place],  [place] [address]  Chief complaint:  Follow-up for severe persistent asthma, COPD Previously on Nucala .  Started Dupixent in May 2025 Needs preop clearance for knee surgery and colonoscopy  HPI: 63 y.o. 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  Discussed the use of AI scribe software for clinical note transcription with the patient, who gave verbal consent to proceed.  History of Present Illness Richard [name] is a 63 year old male with severe persistent asthma, COPD, and diabetes.   He has severe persistent asthma and COPD. He uses an albuterol  inhaler and solution, and Breztri  twice daily. He was previously on Nucala  but changed to Dupixent. He started Dupixent in May 2025 for eczema, which is affecting his back and chest Dupixent as prescribed by his dermatologist. He experiences increased shortness of breath today due to humidity, with chest tightness and wheezing. He is also on Singulair . He is allergic to pollen, which exacerbates his asthma symptoms.   Admitted on 04/01/2024 for asthma exacerbation. During his hospital stay, he was initially placed on BiPAP and  then weaned to oxygen . He was discharged with prednisone , azithromycin , and nebulizers (Duoneb). He is currently taking prednisone  40 mg daily for four more days. He also has chest pain radiating to his right arm and back, which has improved since hospitalization but persists. He was given ibuprofen  for this pain. His chest x-ray and cardiac workup, including troponins and EKG, were unremarkable.  Interim History Richard [name] is a 63 year old male with severe persistent asthma and COPD who presents for follow-up on his respiratory condition and sleep study results.  Dyspnea and airway obstruction - Severe persistent asthma and COPD with significant improvement in breathing since initiation of Dupixent four months ago - Continues maintenance therapy with Breztri  and Singulair  - Asthma exacerbations, previously frequent and triggered by weather changes, have decreased with Dupixent and calmer weather  Sleep disturbance and nocturnal restlessness - Recent sleep study performed; awaiting results - No evidence of sleep apnea - Marked nocturnal restlessness with frequent leg movements, resulting in poor sleep quality - Expresses interest in addressing sleep-related issues  Relevant Pulmonary history: Pets: No pets Occupation: On disability.  Used to work as a Estate agent Exposures: No mold, hot tub, Financial controller.  No feather pillows or comforters No h/o chemo/XRT/amiodarone/macrodantin/MTX  No exposure to asbestos, silica or other organic allergens  Smoking history: 45-pack-year smoker.  Quit in 2023 Travel history: Originally from [place] .  No significant recent travel Relevant family history: No family history of lung disease  Outpatient Encounter Medications as of 07/02/2024  Medication Sig   albuterol  (VENTOLIN  HFA) 108 (90 Base) MCG/ACT inhaler Inhale 1-2 puffs into the lungs every 4 (four) hours as needed for wheezing or shortness of breath.   aspirin  EC 81 MG EC tablet Take 1  tablet  (81 mg total) by mouth daily.   atorvastatin  (LIPITOR) 40 MG tablet Take 1 tablet (40 mg total) by mouth daily.   Budeson-Glycopyrrol-Formoterol  (BREZTRI  AEROSPHERE) 160-9-4.8 MCG/ACT AERO Inhale 2 puffs into the lungs in the morning and at bedtime.   cetirizine  (ZYRTEC  ALLERGY) 10 MG tablet Take 1 tablet (10 mg total) by mouth daily.   clopidogrel  (PLAVIX ) 75 MG tablet Take 1 tablet (75 mg total) by mouth daily.   Continuous Glucose Sensor (FREESTYLE LIBRE 3 PLUS SENSOR) MISC Place 1 sensor on the skin every 15 days. Use to check glucose continuously.   diphenhydrAMINE  (BENADRYL  ALLERGY) 25 mg capsule Take 1 capsule (25 mg total) by mouth at bedtime as needed for allergies or itching.   Dulaglutide  (TRULICITY ) 4.5 MG/0.5ML SOAJ Inject 4.5 mg as directed once a week.   DUPIXENT 300 MG/2ML SOAJ Inject 300 mg into the skin every 14 (fourteen) days.   escitalopram  (LEXAPRO ) 5 MG tablet Take 1 tablet (5 mg total) by mouth daily.   ferrous sulfate  325 (65 FE) MG tablet Take 1 tablet (325 mg total) by mouth daily.   fluticasone  (FLONASE ) 50 MCG/ACT nasal spray Place 1 spray into both nostrils as needed for allergies.   ibuprofen  (ADVIL ) 600 MG tablet Take 1 tablet (600 mg total) by mouth 3 (three) times daily.   insulin  degludec (TRESIBA ) 100 UNIT/ML FlexTouch Pen Inject 20 Units into the skin daily.   Insulin  Pen Needle (PEN NEEDLES) 32G X 4 MM MISC Use 1 each daily as directed.   ipratropium-albuterol  (DUONEB) 0.5-2.5 (3) MG/3ML SOLN Use 1 vial (3 mLs) by nebulization every 6 (six) hours as needed.   methocarbamol  (ROBAXIN ) 500 MG tablet Take 1 tablet (500 mg total) by mouth every 6 (six) hours.   montelukast  (SINGULAIR ) 10 MG tablet Take 1 tablet (10 mg total) by mouth at bedtime.   pioglitazone  (ACTOS ) 30 MG tablet Take 30 mg by mouth daily.   pregabalin  (LYRICA ) 100 MG capsule Take 1 capsule (100 mg total) by mouth 3 (three) times daily.   SYNJARDY  12.02-999 MG TABS Take 1 tablet by mouth 2 (two)  times daily.   tadalafil  (CIALIS ) 10 MG tablet Take 1 tablet (10 mg total) by mouth as needed for erectile dysfunction.   triamcinolone  cream (KENALOG ) 0.1 % Apply 1 Application topically 2 (two) times daily.   Accu-Chek Softclix Lancets lancets 1 each by Other route See admin instructions. 1-2 times daily (Patient not taking: Reported on 07/02/2024)   doxycycline  (VIBRAMYCIN ) 100 MG capsule Take 1 capsule (100 mg total) by mouth 2 (two) times daily. (Patient not taking: Reported on 07/02/2024)   glucose blood test strip 1 each by Other route in the morning and at bedtime. (Patient not taking: Reported on 07/02/2024)   mupirocin  ointment (BACTROBAN ) 2 % Apply 1 Application topically 2 (two) times daily. (Patient not taking: Reported on 07/02/2024)   No facility-administered encounter medications on file as of 07/02/2024.    Vitals:   07/02/24 0837  BP: 110/60  Pulse: 96  Temp: 98.8 F (37.1 C)  Height: 6' 3 (1.905 m)  Weight: 231 lb (104.8 kg)  SpO2: 95%  TempSrc: Oral  BMI (Calculated): 28.87    Physical Exam GEN: No acute distress CV: Regular rate and rhythm no murmurs LUNGS: Clear to auscultation bilaterally normal respiratory effort SKIN JOINTS: Warm and dry no rash   Data Reviewed: Imaging: CTA 02/10/2023 No evidence of pulmonary embolism, emphysema, no acute parenchymal lung abnormality  Chest x-ray 03/29/2024-no  active cardiopulmonary disease  Chest x-ray 06/23/2024-no active cardiopulmonary abnormality I have reviewed the images personally.  PFTs: 12/07/2022 FVC 3.37 [64%], FEV1 2.48 [63%],/F73, TLC 6.35 [83%], DLCO 20.08 [67%] Air trapping, bronchodilator response suggestive of asthma.  Mild diffusion impairment  Labs: CBC 02/14/2024-WBC 4.9, eos 10%, absolute eosinophil count 419 RAST panel 02/21/2018-IgE 886, multiple allergies noted to dog, dust mite, mold, pollen  Sleep: PSG 04/24/2024 AHI 1.2. Periodic limb movment disorder. REM behavior disorder Assessment &  Plan Severe persistent asthma and chronic obstructive pulmonary disease (COPD) Severe persistent asthma and COPD are well-controlled with the current medication regimen. No recent exacerbations reported. Dupixent has been effective in improving symptoms over the past few months.  He has not suffered an exacerbation since hospitalization in June for asthma - Continue Dupixent.  He gets his prescription from dermatology.  Recent chest x-ray earlier this month was clear - Continue Breztri  - Continue Singulair  - Referral for screening CT chest as he has significant smoking history  Eczema Eczema is being managed effectively with Dupixent, with significant improvement in symptoms. - Continue Dupixent  Periodic limb movement and REM sleep behavior disorder Sleep study indicates probable restless legs syndrome, periodic limb movement disorder, and REM sleep behavior disorder. No obstructive sleep apnea detected. Symptoms include restlessness and leg movements during sleep, potentially exacerbated by iron  deficiency anemia though his blood counts are improved recently.SABRA Referral to a sleep specialist is necessary for further evaluation and management. - Refer to sleep specialist for further evaluation and management  Iron  deficiency anemia, resolved Iron  deficiency anemia previously diagnosed, but current blood counts have returned to normal. Anemia may have contributed to restless legs syndrome.  Knee surgery and colonoscopy Knee surgery and colonoscopy was postponed until we got better control of his breathing and his sleep has been evaluated.  His breathing is much improved now. The absence of obstructive sleep apnea on sleep study allows for proceeding with surgery. The colonoscopy is still indicated due to previous iron  deficiency anemia, although blood counts have normalized. - Contact orthopedic specialist for knee surgery.  Patient will make the call - Arrange referral to GI specialist for  colonoscopy  Peri-operative Assessment of Pulmonary Risk for Non-Thoracic Surgery:  Asked to provide pulmonary risk assessment for planned knee surgery, colonoscopy  By ARISCAT criteria patient has   Low risk 1.6% risk of in-hospital post-op pulmonary complications (composite including respiratory failure, respiratory infection, pleural effusion, atelectasis, pneumothorax, bronchospasm, aspiration pneumonitis)  ForMs. Lewis, risk of perioperative pulmonary complications is increased by:  Age greater than 65 years  COPD   Respiratory complications generally occur in 1% of ASA Class I patients, 5% of ASA Class II and 10% of ASA Class III-IV patients These complications rarely result in mortality and iclude postoperative pneumonia, atelectasis, pulmonary embolism, ARDS and increased time requiring postoperative mechanical ventilation.  Overall, I recommend proceeding with the surgery if the risk for respiratory complications are outweighed by the potential benefits. This will need to be discussed between the patient and surgeon.  To reduce risks of respiratory complications, I recommend: --Pre- and post-operative incentive spirometry performed frequently while awake --Avoiding use of pancuronium during anesthesia.  I have discussed the risk factors and recommendations above with the patient.   Raymond Bhardwaj MD [facility] See Amion for pager  If no response to pager , please call [phone] until 7pm After 7:00 pm call Elink  [phone] 07/02/2024, 9:17 AM    Recommendations: Continue Breztri , Singulair  Continue  Dupixent.   Referral to sleep specialist for restless leg syndrome, REM behavior disorder Proceed with knee surgery and colonoscopy Lung cancer screening referral  Lonna Coder MD [facility] 07/02/2024, 8:58 AM  CC: Rosan Dayton [name], DO

## 2024-07-03 ENCOUNTER — Ambulatory Visit (INDEPENDENT_AMBULATORY_CARE_PROVIDER_SITE_OTHER): Admitting: Otolaryngology

## 2024-07-03 VITALS — BP 101/65 | HR 91 | Temp 98.0°F

## 2024-07-03 DIAGNOSIS — J310 Chronic rhinitis: Secondary | ICD-10-CM | POA: Diagnosis not present

## 2024-07-03 DIAGNOSIS — H902 Conductive hearing loss, unspecified: Secondary | ICD-10-CM | POA: Diagnosis not present

## 2024-07-03 DIAGNOSIS — J343 Hypertrophy of nasal turbinates: Secondary | ICD-10-CM

## 2024-07-03 DIAGNOSIS — H6123 Impacted cerumen, bilateral: Secondary | ICD-10-CM | POA: Diagnosis not present

## 2024-07-03 DIAGNOSIS — R0981 Nasal congestion: Secondary | ICD-10-CM

## 2024-07-03 DIAGNOSIS — R0982 Postnasal drip: Secondary | ICD-10-CM

## 2024-07-03 DIAGNOSIS — D380 Neoplasm of uncertain behavior of larynx: Secondary | ICD-10-CM | POA: Diagnosis not present

## 2024-07-05 DIAGNOSIS — D380 Neoplasm of uncertain behavior of larynx: Secondary | ICD-10-CM | POA: Insufficient documentation

## 2024-07-05 NOTE — Progress Notes (Signed)
 Patient ID: Richard [name], male   DOB: [birth year], 63 y.o.   MRN: [id number]  Follow-up: Vallecular thickening, shortness of breath, nasal congestion, postnasal drainage New complaints: Cerumen impaction, hearing loss  HPI: The patient is a 63 year old male who returns today for his follow-up evaluation.  The patient was previously seen on as an inpatient consult.  At that time, he was admitted to the [facility] to treat his shortness of breath and asthma exacerbation.  His CT scan showed asymmetric thickening within the right vallecula.  Reevaluation did not show any abnormal lesion.  He was also complaining of chronic nasal congestion and postnasal drainage.  At his last visit, he was noted to have chronic rhinitis, with mucosal congestion and bilateral inferior turbinate hypertrophy.  Postnasal drainage was also noted.  He was treated with Flonase  nasal spray and nasal saline irrigation.  The patient returns today reporting improvement in his nasal congestion and nasal drainage.  He denies any dysphagia, odynophagia, or dyspnea.  However, he has a new complaint of hearing difficulty and possible cerumen impaction.  He denies any otalgia, otorrhea, or vertigo.  Exam: General: Communicates without difficulty, well nourished, no acute distress. Head: Normocephalic, no evidence injury, no tenderness, facial buttresses intact without stepoff. Face/sinus: No tenderness to palpation and percussion. Facial movement is normal and symmetric. Eyes: PERRL, EOMI. No scleral icterus, conjunctivae clear. Neuro: CN II exam reveals vision grossly intact.  No nystagmus at any point of gaze. Ears: Auricles well formed without lesions.  Bilateral cerumen impaction.  Nose: External evaluation reveals normal support and skin without lesions.  Dorsum is intact.  Anterior rhinoscopy reveals congested mucosa over anterior aspect of inferior turbinates and intact septum.  No purulence noted. Oral:  Oral cavity and  oropharynx are intact, symmetric, without erythema or edema.  Mucosa is moist without lesions.  No palpable tongue base lesion.  Neck: Full range of motion without pain.  There is no significant lymphadenopathy.  No masses palpable.  Thyroid  bed within normal limits to palpation.  Parotid glands and submandibular glands equal bilaterally without mass.  Trachea is midline. Neuro:  CN 2-12 grossly intact.   Procedure:  Flexible Fiberoptic Laryngoscopy Anesthesia: None Description: Risks, benefits, and alternatives of flexible endoscopy were explained to the patient. Specific mention was made of the risk of throat numbness with difficulty swallowing, possible bleeding from the nose and mouth, and pain from the procedure.  The patient gave oral consent to proceed.  The flexible scope was inserted into the right nasal cavity and advanced towards the nasopharynx.  Visualized mucosa over the turbinates and septum were congested.  The nasopharynx was clear.  Oropharyngeal walls were symmetric and mobile without lesion, mass, or edema.  Hypopharynx was also without  lesion or edema.  Larynx was mobile without lesions.  No lesions or asymmetry in the supraglottic larynx.  Arytenoid mucosa was mildly edematous.  Posterior commissure with mild edema and redundant mucosa.  True vocal folds were pale yellow and without mass or lesion.     Procedure: Bilateral cerumen disimpaction Anesthesia: None Description: Under the operating microscope, the cerumen is carefully removed with a combination of cerumen currette, alligator forceps, and suction catheters.  After the cerumen is removed, the TMs are noted to be normal.  No mass, erythema, or lesions. The patient tolerated the procedure well.   Assessment: 1.  Chronic rhinitis with nasal mucosal congestion and bilateral inferior turbinate hypertrophy.  The severity of his nasal congestion has decreased. 2.  Clinically improved chronic postnasal drainage. 3.  Asymmetric  thickening of the right vallecula on CT scan.  However, no suspicious lesion or mass is noted on today's laryngoscopy and physical examination. 4.  Bilateral cerumen impaction, causing conductive hearing loss.  He reports significant improvement in his hearing after the cerumen disimpaction procedure.  Plan: 1.  The physical exam and laryngoscopy findings are reviewed with the patient. 2.  Continue with Flonase  nasal spray 2 sprays each nostril daily. 3.  Nasal saline irrigation as needed. 4.  Otomicroscopy with bilateral cerumen disimpaction. 5.  The patient will return for reevaluation in 12 months, sooner if needed.

## 2024-07-06 ENCOUNTER — Other Ambulatory Visit: Payer: Self-pay

## 2024-07-06 NOTE — Progress Notes (Deleted)
 Internal Medicine Clinic Attending  Case discussed with the resident at the time of the visit.  We reviewed the resident's history and exam and pertinent patient test results.  I agree with the assessment, diagnosis, and plan of care documented in the resident's note.

## 2024-07-06 NOTE — Telephone Encounter (Signed)
 Ov with Mannam 07/02/24 faxed to [facility].

## 2024-07-07 ENCOUNTER — Telehealth: Payer: Self-pay

## 2024-07-07 NOTE — Telephone Encounter (Signed)
-----   Message from [place] D. Zehr sent at 07/02/2024 12:02 PM EDT ----- Thank you!!  Dr. Theophilus, is he still require O2 use?  Darian Cansler, you can go ahead and schedule him for EGD and colonoscopy with Veena for IDA and NSAID use.  He is on Plavix  that is prescribed by Dr. Rosan. Let's see about the O2 use to see where we need to have it done.  Thank you,  Jess ----- Message ----- From: Mannam, Praveen, MD Sent: 07/02/2024   9:23 AM EDT To: Odetta LITTIE Curly, RN; Harlene BIRCH Zehr, PA-C  Hi We held off on colonoscopy until his sleep study has been completed and asthma is under better control.  Both have been done and we can proceed with colonoscopy.  Can you please arrange follow-up with patient. Thank you  Praveen

## 2024-07-08 ENCOUNTER — Encounter: Payer: Self-pay | Admitting: Gastroenterology

## 2024-07-08 NOTE — Telephone Encounter (Signed)
 Hi,  He is not on supplemental oxygen  even before and is not on it now. Thanks

## 2024-07-08 NOTE — Telephone Encounter (Signed)
 Can one of you set up previsit and colon with Dr Shila please thank you ?

## 2024-07-09 ENCOUNTER — Telehealth: Payer: Self-pay

## 2024-07-09 ENCOUNTER — Telehealth (HOSPITAL_BASED_OUTPATIENT_CLINIC_OR_DEPARTMENT_OTHER): Payer: Self-pay | Admitting: *Deleted

## 2024-07-09 DIAGNOSIS — M1711 Unilateral primary osteoarthritis, right knee: Secondary | ICD-10-CM | POA: Diagnosis not present

## 2024-07-09 NOTE — Telephone Encounter (Signed)
 S/w the pt and he has been scheduled 07/14/24 tele preop appt. Pt states surgeon will not schedule surgery until cleared by cardiology. Med rec and consent are done.

## 2024-07-09 NOTE — Telephone Encounter (Signed)
   Pre-operative Risk Assessment    Patient Name: Viaan Knippenberg  DOB: [birth year] MRN: [id number]   Date of last office visit: 04/13/24 LAMARR HUMMER, PA-C Date of next office visit: NONE   Request for Surgical Clearance    Procedure:  RIGHT TOTAL KNEE ARTHROPLASTY  Date of Surgery:  Clearance TBD                                Surgeon:  TORIBIO HIGASHI, MD Surgeon's Group or Practice Name:  [facility] Phone number:  [phone]   EXT 3134 Fax number:  [phone]  ATTN: KELLY HIGH   Type of Clearance Requested:   - Medical  - Pharmacy:  Hold Aspirin  and Clopidogrel  (Plavix )     Type of Anesthesia:  Spinal   Additional requests/questions:    SignedLucie DELENA Ku   07/09/2024, 10:37 AM

## 2024-07-09 NOTE — Telephone Encounter (Signed)
 S/w the pt and he has been scheduled 07/14/24 tele preop appt. Pt states surgeon will not schedule surgery until cleared by cardiology. Med rec and consent are done.      Patient Consent for Virtual Visit        Jaramiah Bossard has provided verbal consent on 07/09/2024 for a virtual visit (video or telephone).   CONSENT FOR VIRTUAL VISIT FOR:  Isla Pinal  By participating in this virtual visit I agree to the following:  I hereby voluntarily request, consent and authorize [facility] and its employed or contracted physicians, physician assistants, nurse practitioners or other licensed health care professionals (the Practitioner), to provide me with telemedicine health care services (the "Services) as deemed necessary by the treating Practitioner. I acknowledge and consent to receive the Services by the Practitioner via telemedicine. I understand that the telemedicine visit will involve communicating with the Practitioner through live audiovisual communication technology and the disclosure of certain medical information by electronic transmission. I acknowledge that I have been given the opportunity to request an in-person assessment or other available alternative prior to the telemedicine visit and am voluntarily participating in the telemedicine visit.  I understand that I have the right to withhold or withdraw my consent to the use of telemedicine in the course of my care at any time, without affecting my right to future care or treatment, and that the Practitioner or I may terminate the telemedicine visit at any time. I understand that I have the right to inspect all information obtained and/or recorded in the course of the telemedicine visit and may receive copies of available information for a reasonable fee.  I understand that some of the potential risks of receiving the Services via telemedicine include:  Delay or interruption in medical evaluation due to technological equipment  failure or disruption; Information transmitted may not be sufficient (e.g. poor resolution of images) to allow for appropriate medical decision making by the Practitioner; and/or  In rare instances, security protocols could fail, causing a breach of personal health information.  Furthermore, I acknowledge that it is my responsibility to provide information about my medical history, conditions and care that is complete and accurate to the best of my ability. I acknowledge that Practitioner's advice, recommendations, and/or decision may be based on factors not within their control, such as incomplete or inaccurate data provided by me or distortions of diagnostic images or specimens that may result from electronic transmissions. I understand that the practice of medicine is not an exact science and that Practitioner makes no warranties or guarantees regarding treatment outcomes. I acknowledge that a copy of this consent can be made available to me via my patient portal [facility] MyChart), or I can request a printed copy by calling the office of [facility].    I understand that my insurance will be billed for this visit.   I have read or had this consent read to me. I understand the contents of this consent, which adequately explains the benefits and risks of the Services being provided via telemedicine.  I have been provided ample opportunity to ask questions regarding this consent and the Services and have had my questions answered to my satisfaction. I give my informed consent for the services to be provided through the use of telemedicine in my medical care

## 2024-07-09 NOTE — Telephone Encounter (Signed)
   Name: Richard [name]  DOB: [birth year]  MRN: [id number]  Primary Cardiologist: Ozell Fell, MD   Preoperative team, please contact this patient and set up a phone call appointment for further preoperative risk assessment. Please obtain consent and complete medication review. Thank you for your help.  I confirm that guidance regarding antiplatelet and oral anticoagulation therapy has been completed and, if necessary, noted below.  Plavix  is managed per vascular surgery. From a cardiac standpoint, if stress test unremarkable, he may hold aspirin  for 5 to 7 days prior to procedure.   I also confirmed the patient resides in the state of [place] . As per [facility] Medical Board telemedicine laws, the patient must reside in the state in which the provider is licensed.   Wyn Raddle, Jackee Shove, NP 07/09/2024, 10:45 AM [facility]

## 2024-07-10 ENCOUNTER — Ambulatory Visit (INDEPENDENT_AMBULATORY_CARE_PROVIDER_SITE_OTHER): Admitting: Podiatry

## 2024-07-10 ENCOUNTER — Encounter: Payer: Self-pay | Admitting: Podiatry

## 2024-07-10 DIAGNOSIS — L97501 Non-pressure chronic ulcer of other part of unspecified foot limited to breakdown of skin: Secondary | ICD-10-CM | POA: Diagnosis not present

## 2024-07-10 NOTE — Progress Notes (Signed)
 Patient presents for follow-up ulceration right foot.  Doing well no complaints.  No fever or chills or nausea or vomiting.  Has noticed minimal drainage from the area.  He continues to soak it and apply Bactroban  ointment.   Physical exam:  General appearance: Pleasant, and in no acute distress. AOx3.  Vascular: Pedal pulses: DP 2/4 bilaterally, PT 2/4 bilaterally.  Mild edema lower legs bilaterally. Capillary fill time immediate bilaterally.  Neurological: Light touch intact feet bilaterally.  Normal Achilles reflex bilaterally.    Dermatologic:   Ulcer plantar forefoot right superficial.  3 mm x 2 mm x 1 mm deep.  Just penetrates into the dermis.  No signs of infection.  Slight clear drainage.  No signs of infection.  Musculoskeletal:     Diagnosis: 1.  Superficial Wagner grade 1 ulceration plantar forefoot right-almost healed  Plan: -Established office visit for evaluation and management level 3. - Discussed with him the ulcer on the plantar right foot.  Ulcer is almost healed at this point.  No signs of infection.  Wound is to clean the area with some soapy water twice a day and apply some Bactroban  ointment and a light bandage to the area.  Probably only needs to do this for another week or 2 and will be healed.  He will see Dr. Loreda in a few weeks and he can evaluate it then. -Debrided some loose dry hyperkeratotic tissue from around the ulcer right foot  Return with Dr. Maritza for previously scheduled appointment

## 2024-07-13 ENCOUNTER — Ambulatory Visit: Payer: Self-pay

## 2024-07-13 NOTE — Telephone Encounter (Signed)
 FYI Only or Action Required?: FYI only for provider.  Patient was last seen in primary care on 06/04/2024 by Amilibia, [name], DO.  Called Nurse Triage reporting Asthma and Chest Pain.  Symptoms began today.  Interventions attempted: Prescription medications: albuterol  INH and NEB.  Symptoms are: unchanged.  Triage Disposition: Go to ED Now (or PCP Triage)  Patient/caregiver understands and will follow disposition?: Unsure         Copied from CRM [id number]. Topic: Clinical - Red Word Triage >> Jul 13, 2024  8:36 AM Richard [name] wrote: Red Word that prompted transfer to Nurse Triage: Chest pain/asthma acting up since 10/05 Reason for Disposition  [1] MODERATE asthma attack (e.g., SOB at rest, speaks in phrases, audible wheezes) AND [2] not resolved after 2 or 3 inhaler or nebulizer treatments given 20 minutes apart  Answer Assessment - Initial Assessment Questions 1. RESPIRATORY STATUS: Describe your breathing? (e.g., wheezing, shortness of breath, unable to speak, severe coughing)      Heavy breathing coughing 2. ONSET: When did this asthma attack begin?      yesterday 3. TRIGGER: What do you think triggered this attack? (e.g., URI, exposure to pollen or other allergen, tobacco smoke)      unknown 4. PEAK EXPIRATORY FLOW RATE (PEFR): Do you use a peak flow meter? If Yes, ask: What's the current peak flow? What's your personal best peak flow?      N/a 5. SEVERITY: How bad is this attack?      He's struggling [facility] does appreciate audible SOB/wheezing with cough during call. Pt is speaking in partial sentences.  6. ASTHMA MEDICINES:  What treatments have you tried?      albuterol  7. INHALED QUICK-RELIEF TREATMENTS FOR THIS ATTACK: What treatments have you given yourself so far? and How many and how often? If using an inhaler, ask, How many puffs? Note: Routine treatments are 2 puffs every 4 hours as needed. Rescue treatments are 4 puffs repeated every 20  minutes, up to three times as needed.      Albuterol  INH/NEB - last took inhaler and neb this AM 8. OTHER SYMPTOMS: Do you have any other symptoms? (e.g., chest pain, coughing up yellow sputum, fever, runny nose)     Chest tightness. Denies other sx 9. O2 SATURATION MONITOR:  Do you use an oxygen  saturation monitor (pulse oximeter) at home? If Yes, What is your reading (oxygen  level) today? What is your usual oxygen  saturation reading? (e.g., 95%)     Does noit have 10. PREGNANCY: Is there any chance you are pregnant? When was your last menstrual period?       N/a  Protocols used: Asthma Attack-A-AH

## 2024-07-13 NOTE — Telephone Encounter (Signed)
 Called pt - he did not go to the ER. Stated he took another breathing tx. Stated he feels a little better. Stated he will call back in the am to schedule an appt.

## 2024-07-14 ENCOUNTER — Ambulatory Visit: Attending: Cardiovascular Disease | Admitting: Student

## 2024-07-14 DIAGNOSIS — Z01810 Encounter for preprocedural cardiovascular examination: Secondary | ICD-10-CM | POA: Diagnosis not present

## 2024-07-14 NOTE — Telephone Encounter (Signed)
 Pt stated he feels much better today.  No appt needed at this time.
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
## 2024-07-15 ENCOUNTER — Other Ambulatory Visit: Payer: Self-pay

## 2024-07-15 ENCOUNTER — Other Ambulatory Visit (HOSPITAL_COMMUNITY): Payer: Self-pay

## 2024-07-16 ENCOUNTER — Other Ambulatory Visit: Payer: Self-pay

## 2024-07-17 ENCOUNTER — Encounter: Payer: Self-pay | Admitting: Podiatry

## 2024-07-17 ENCOUNTER — Ambulatory Visit (INDEPENDENT_AMBULATORY_CARE_PROVIDER_SITE_OTHER): Admitting: Podiatry

## 2024-07-17 DIAGNOSIS — L84 Corns and callosities: Secondary | ICD-10-CM

## 2024-07-17 DIAGNOSIS — E1140 Type 2 diabetes mellitus with diabetic neuropathy, unspecified: Secondary | ICD-10-CM

## 2024-07-17 DIAGNOSIS — E113311 Type 2 diabetes mellitus with moderate nonproliferative diabetic retinopathy with macular edema, right eye: Secondary | ICD-10-CM | POA: Diagnosis not present

## 2024-07-17 DIAGNOSIS — E113212 Type 2 diabetes mellitus with mild nonproliferative diabetic retinopathy with macular edema, left eye: Secondary | ICD-10-CM | POA: Diagnosis not present

## 2024-07-17 NOTE — Progress Notes (Addendum)
 This patient presents to the office with chief complaint of  painful callus ball of right foot.  .  Patient is a diabetic with amputation left forefoot.  Patient is unable to  self treat his own callus. This patient presents  to the office today for treatment of his thick callus.  General Appearance  Alert, conversant and in no acute stress.  Vascular  Dorsalis pedis and posterior tibial  pulses are  palpable  B/L.   Temperature is within normal limits  B/L.  Neurologic  Senn-Weinstein monofilament wire test within normal limit. Muscle power within normal limits   Nails Thick disfigured discolored nails with subungual debris  from hallux to fifth toes right foot.. No evidence of bacterial infection or drainage bilaterally.  Orthopedic  No limitations of motion of motion feet .  No crepitus or effusions noted.  No bony pathology or digital deformities noted. TMA left foot.  Skin  normotropic skin with noted bilaterally.  No signs of infections or ulcers noted.   Porokeratosis sub 2 right foot.  Porokeratosis /Ulcer right foot    His ulcer/porokeratosis has no evidence of drainage.  Chronic fibrotic tissue sub 2 right foot.  Patient was evaluated by Dr.  Tobie for possible bone surgery to eliminate his chronic ulcer. RTC 3 weeks per patient.   Cordella Bold DPM

## 2024-07-17 NOTE — Addendum Note (Signed)
 Addended by: LOREDA HACKER on: 07/17/2024 09:24 AM   Modules accepted: Level of Service

## 2024-07-20 ENCOUNTER — Ambulatory Visit: Admitting: Student

## 2024-07-27 ENCOUNTER — Encounter: Payer: Self-pay | Admitting: Physician Assistant

## 2024-07-27 ENCOUNTER — Ambulatory Visit: Admitting: Dermatology

## 2024-07-27 ENCOUNTER — Ambulatory Visit: Admitting: Physician Assistant

## 2024-07-27 VITALS — BP 81/55

## 2024-07-27 DIAGNOSIS — L209 Atopic dermatitis, unspecified: Secondary | ICD-10-CM | POA: Diagnosis not present

## 2024-07-27 MED ORDER — CLOBETASOL PROPIONATE 0.05 % EX CREA: 1.0000 | TOPICAL_CREAM | Freq: Two times a day (BID) | CUTANEOUS | 2 refills | Status: DC

## 2024-07-27 MED ORDER — TRIAMCINOLONE ACETONIDE 0.1 % EX CREA: 1.0000 | TOPICAL_CREAM | Freq: Two times a day (BID) | CUTANEOUS | 0 refills | Status: DC

## 2024-07-27 MED ORDER — CLOBETASOL PROPIONATE 0.05 % EX CREA: 1.0000 | TOPICAL_CREAM | Freq: Two times a day (BID) | CUTANEOUS | 1 refills | Status: DC

## 2024-07-27 MED ORDER — TRIAMCINOLONE ACETONIDE 0.1 % EX CREA: 1.0000 | TOPICAL_CREAM | Freq: Two times a day (BID) | CUTANEOUS | 1 refills | Status: AC

## 2024-07-27 NOTE — Patient Instructions (Signed)

## 2024-07-27 NOTE — Progress Notes (Signed)
   New Patient Visit   Subjective  Richard [name] is a 63 y.o. male who presents for a NEW PATIENT appointment to be examined for the concerns as listed below.  Atopic Derm/Eczema: Pt clear today. He stated he flares every few months at the arms, legs during the hotter months that he Tx with [facility] but pt stated that it is ineffective. He would like a better topical regimen. He is only using sensitive skin body care products & laundry detergent.     No Hx of Bx.  No family Hx of skin cancer.   The following portions of the chart were reviewed this encounter and updated as appropriate: medications, allergies, medical history  Review of Systems:  No other skin or systemic complaints except as noted in HPI or Assessment and Plan.  Objective  Well appearing patient in no apparent distress; mood and affect are within normal limits.   A focused examination was performed of the following areas: scattered   Relevant exam findings are noted in the Assessment and Plan.    Assessment & Plan   ATOPIC DERMATITIS Exam: post-inflammatory hyperpigmented papules and patches  15% BSA  Stable   Treatment Plan: - Rx TMC cream - apply BID for 2 weeks on, 2 weeks.  - Rx clobetasol cream - apply BID for 2 weeks on, 2 weeks off.  Recommend gentle skin care.   ATOPIC DERMATITIS, UNSPECIFIED TYPE    Return in about 4 months (around 11/27/2024) for ATOPIC DERM/ECZEMA.   Documentation: I have reviewed the above documentation for accuracy and completeness, and I agree with the above.  I, Shirron Maranda, CMA, am acting as Neurosurgeon for [employer], DO.   Keniah Klemmer K, PA-C

## 2024-07-30 ENCOUNTER — Telehealth: Payer: Self-pay

## 2024-07-30 NOTE — Telephone Encounter (Signed)
 Request call back after knee surgery for [facility] and LDCT.

## 2024-08-03 ENCOUNTER — Encounter: Payer: Self-pay | Admitting: Internal Medicine

## 2024-08-03 ENCOUNTER — Other Ambulatory Visit: Payer: Self-pay

## 2024-08-03 ENCOUNTER — Ambulatory Visit (INDEPENDENT_AMBULATORY_CARE_PROVIDER_SITE_OTHER): Admitting: Internal Medicine

## 2024-08-03 VITALS — BP 107/64 | HR 92 | Temp 98.1°F | Ht 75.0 in | Wt 230.8 lb

## 2024-08-03 DIAGNOSIS — D509 Iron deficiency anemia, unspecified: Secondary | ICD-10-CM

## 2024-08-03 DIAGNOSIS — Z794 Long term (current) use of insulin: Secondary | ICD-10-CM | POA: Diagnosis not present

## 2024-08-03 DIAGNOSIS — M1711 Unilateral primary osteoarthritis, right knee: Secondary | ICD-10-CM

## 2024-08-03 DIAGNOSIS — J4489 Other specified chronic obstructive pulmonary disease: Secondary | ICD-10-CM | POA: Diagnosis not present

## 2024-08-03 DIAGNOSIS — E1151 Type 2 diabetes mellitus with diabetic peripheral angiopathy without gangrene: Secondary | ICD-10-CM

## 2024-08-03 DIAGNOSIS — I10 Essential (primary) hypertension: Secondary | ICD-10-CM

## 2024-08-03 DIAGNOSIS — Z7984 Long term (current) use of oral hypoglycemic drugs: Secondary | ICD-10-CM

## 2024-08-03 MED ORDER — NAPROXEN 500 MG PO TABS
500.0000 mg | ORAL_TABLET | Freq: Two times a day (BID) | ORAL | 1 refills | Status: DC
  Filled 2024-08-03: qty 60, 30d supply, fill #0
  Filled 2024-08-26: qty 60, 30d supply, fill #1

## 2024-08-03 MED ORDER — FREESTYLE LIBRE 3 PLUS SENSOR MISC
3 refills | Status: AC
  Filled 2024-08-03: qty 6, fill #0
  Filled 2024-08-31 (×2): qty 6, 90d supply, fill #0
  Filled 2024-11-12: qty 6, 90d supply, fill #1
  Filled 2024-11-13: qty 6, 90d supply, fill #0

## 2024-08-03 NOTE — Progress Notes (Signed)
 Subjective:  HPI: Chief Complaint  Patient presents with   Medical Management of Chronic Issues    Right knee pain/swollen.   Medication Refill    Discussed the use of AI scribe software for clinical note transcription with the patient, who gave verbal consent to proceed.  History of Present Illness Richard [name] is a 63 year old male who presents with severe knee pain and swelling.  He experiences severe swelling and pain in his knee, describing it as 'like a basketball' with constant burning and popping sensations. The pain disrupts his sleep, waking him up at times. He has been prescribed a medication at 15 mg once daily, which he feels is ineffective as 'the pain is eating it up'. He has taken over-the-counter ibuprofen  without relief. However, he reports some improvement in swelling and pain with naproxen , which he took at 500 mg twice a day, borrowed from his girlfriend. He is seeking a more effective pain management solution until his upcoming surgery.  He is currently managing diabetes with medications including Synjardy  twice a day, pioglitazone , and 20 units of Tresiba  insulin . He also uses a continuous glucose monitor (CGM) and mentions needing a refill for it. His A1c is well-controlled at 6.5.  His asthma is well-controlled, with no need for oxygen . He uses albuterol  and Dupixent injections for asthma and allergies. His asthma symptoms improve in cooler weather. He reports feeling better after a recent period of feeling 'drained' and attributes previous asthma exacerbations to weather changes.  He takes an iron  supplement. He has a sleep study scheduled and reports leg movements during sleep.     Please see Assessment and Plan below for the status of his chronic medical problems.  Objective:  Physical Exam: Vitals:   08/03/24 0852  BP: 107/64  Pulse: 92  Temp: 98.1 F (36.7 C)  TempSrc: Oral  SpO2: 98%  Weight: 230 lb 12.8 oz (104.7 kg)  Height: 6' 3 (1.905 m)    Body mass index is 28.85 kg/m. Physical Exam Constitutional:      Appearance: Normal appearance.  Cardiovascular:     Rate and Rhythm: Normal rate and regular rhythm.  Pulmonary:     Effort: Pulmonary effort is normal.     Breath sounds: Normal breath sounds. No wheezing.  Musculoskeletal:     Right knee: Effusion present. No erythema. Tenderness present over the medial joint line and lateral joint line.     Right lower leg: No edema.     Left lower leg: No edema.  Neurological:     Mental Status: He is alert.    Results LABS A1c: 6.5  No results found for any visits on 08/03/24.  The 10-year ASCVD risk score (Arnett DK, et al., 2019) is: 11.6%  Assessment & Plan:  See Encounters Tab for problem based charting. Assessment & Plan Type 2 diabetes mellitus with peripheral artery disease (HCC) A1c well controlled, continue trulicity , synjardy , pioglitazone  and 20 units of tresiba  daily. Orders:   Continuous Glucose Sensor (FREESTYLE LIBRE 3 PLUS SENSOR) MISC; Place 1 sensor on the skin every 15 days. Use to check glucose continuously.   Comprehensive metabolic panel with GFR  Asthma-COPD overlap syndrome [facility]) Following with Dr Gladis, doing much better on breztri , dupixent, singular. Orders:   CBC with Diff  Essential hypertension BP well controlled, no currently needing any antihyperensive medications Orders:   CBC with Diff  Primary osteoarthritis of right knee Following with Orthopedic surgery has been optimized and planning for sugery on 11/17.  Iron  deficiency anemia, unspecified iron  deficiency anemia type Remains on oral supplement, goal ferrtin >75 given nocturnal leg movements Orders:   Iron , TIBC and Ferritin Panel    Medications Ordered Meds ordered this encounter  Medications   naproxen  (NAPROSYN ) 500 MG tablet    Sig: Take 1 tablet (500 mg total) by mouth 2 (two) times daily with a meal.    Dispense:  60 tablet    Refill:  1   Continuous  Glucose Sensor (FREESTYLE LIBRE 3 PLUS SENSOR) MISC    Sig: Place 1 sensor on the skin every 15 days. Use to check glucose continuously.    Dispense:  6 each    Refill:  3    The patient is insulin  requiring, ICD 10 code E11.65. The patient tests 1 times per day.   Other Orders Orders Placed This Encounter  Procedures   CBC with Diff   Comprehensive metabolic panel with GFR   Iron , TIBC and Ferritin Panel   Follow Up: Return in about 2 months (around 10/03/2024) for DM.

## 2024-08-03 NOTE — Assessment & Plan Note (Addendum)
 BP well controlled, no currently needing any antihyperensive medications Orders:   CBC with Diff

## 2024-08-03 NOTE — Assessment & Plan Note (Addendum)
 A1c well controlled, continue trulicity , synjardy , pioglitazone  and 20 units of tresiba  daily. Orders:   Continuous Glucose Sensor (FREESTYLE LIBRE 3 PLUS SENSOR) MISC; Place 1 sensor on the skin every 15 days. Use to check glucose continuously.   Comprehensive metabolic panel with GFR

## 2024-08-03 NOTE — Assessment & Plan Note (Addendum)
 Remains on oral supplement, goal ferrtin >75 given nocturnal leg movements Orders:   Iron , TIBC and Ferritin Panel

## 2024-08-03 NOTE — Assessment & Plan Note (Addendum)
 Following with Orthopedic surgery has been optimized and planning for sugery on 11/17.

## 2024-08-03 NOTE — Assessment & Plan Note (Addendum)
 Following with Dr Gladis, doing much better on breztri , dupixent, singular. Orders:   CBC with Diff

## 2024-08-04 LAB — COMPREHENSIVE METABOLIC PANEL WITH GFR
ALT: 30 IU/L (ref 0–44)
AST: 22 IU/L (ref 0–40)
Albumin: 4.2 g/dL (ref 3.9–4.9)
Alkaline Phosphatase: 103 IU/L (ref 47–123)
BUN/Creatinine Ratio: 20 (ref 10–24)
BUN: 20 mg/dL (ref 8–27)
Bilirubin Total: 0.2 mg/dL (ref 0.0–1.2)
CO2: 24 mmol/L (ref 20–29)
Calcium: 9.4 mg/dL (ref 8.6–10.2)
Chloride: 100 mmol/L (ref 96–106)
Creatinine, Ser: 1.01 mg/dL (ref 0.76–1.27)
Globulin, Total: 2.9 g/dL (ref 1.5–4.5)
Glucose: 108 mg/dL — ABNORMAL HIGH (ref 70–99)
Potassium: 5 mmol/L (ref 3.5–5.2)
Sodium: 136 mmol/L (ref 134–144)
Total Protein: 7.1 g/dL (ref 6.0–8.5)
eGFR: 83 mL/min/1.73 (ref >59)

## 2024-08-04 LAB — CBC WITH DIFFERENTIAL/PLATELET
Basophils Absolute: 0.1 x10E3/uL (ref 0.0–0.2)
Basos: 2 %
EOS (ABSOLUTE): 0.6 x10E3/uL — ABNORMAL HIGH (ref 0.0–0.4)
Eos: 12 %
Hematocrit: 41.3 % (ref 37.5–51.0)
Hemoglobin: 13.1 g/dL (ref 13.0–17.7)
Immature Grans (Abs): 0 x10E3/uL (ref 0.0–0.1)
Immature Granulocytes: 0 %
Lymphocytes Absolute: 1 x10E3/uL (ref 0.7–3.1)
Lymphs: 18 %
MCH: 26.5 pg — ABNORMAL LOW (ref 26.6–33.0)
MCHC: 31.7 g/dL (ref 31.5–35.7)
MCV: 84 fL (ref 79–97)
Monocytes Absolute: 0.6 x10E3/uL (ref 0.1–0.9)
Monocytes: 11 %
Neutrophils Absolute: 3.2 x10E3/uL (ref 1.4–7.0)
Neutrophils: 56 %
Platelets: 265 x10E3/uL (ref 150–450)
RBC: 4.94 x10E6/uL (ref 4.14–5.80)
RDW: 13.3 % (ref 11.6–15.4)
WBC: 5.5 x10E3/uL (ref 3.4–10.8)

## 2024-08-04 LAB — IRON,TIBC AND FERRITIN PANEL
Ferritin: 48 ng/mL (ref 30–400)
Iron Saturation: 20 % (ref 15–55)
Iron: 69 ug/dL (ref 38–169)
Total Iron Binding Capacity: 341 ug/dL (ref 250–450)
UIBC: 272 ug/dL (ref 111–343)

## 2024-08-06 ENCOUNTER — Telehealth: Payer: Self-pay

## 2024-08-06 DIAGNOSIS — Z8619 Personal history of other infectious and parasitic diseases: Secondary | ICD-10-CM | POA: Insufficient documentation

## 2024-08-06 DIAGNOSIS — Z8614 Personal history of Methicillin resistant Staphylococcus aureus infection: Secondary | ICD-10-CM

## 2024-08-06 DIAGNOSIS — E119 Type 2 diabetes mellitus without complications: Secondary | ICD-10-CM | POA: Insufficient documentation

## 2024-08-06 HISTORY — DX: Personal history of other infectious and parasitic diseases: Z86.19

## 2024-08-06 HISTORY — DX: Personal history of Methicillin resistant Staphylococcus aureus infection: Z86.14

## 2024-08-06 NOTE — Telephone Encounter (Signed)
 Author: Rosan Dayton [name], DO Service: Internal Medicine Author Type: Physician  Filed: 08/06/2024  3:07 PM Encounter Date: 08/06/2024 Status: Signed  Editor: Rosan Dayton [name], DO (Physician)   Plavix  is for PAD, ok to hold for 5 days prior for procedure.  Resume day after procedure if not ongoing bleeding concerns.   Rosan Dayton [name], DO

## 2024-08-06 NOTE — Telephone Encounter (Signed)
 Plavix  is for PAD, ok to hold for 5 days prior for procedure.  Resume day after procedure if not ongoing bleeding concerns.  Rosan Dayton [name], DO

## 2024-08-06 NOTE — Telephone Encounter (Signed)
 Appt moved to 12/15 to accommodate both endo and colon.  Plavix  letter sent to PCP.

## 2024-08-06 NOTE — Telephone Encounter (Signed)
 Good morning Richard [name],  I was completing chart prep in preparation for this patient's PV appt.  I noticed this patient is on PLAVIX  and we will need that hold obtained as soon as possible as his PV is scheduled on 08/11/2024 in [facility] [address].  Secondly, I see that in the past notes that the patient might have needed to be scheduled for an EGD and a colonoscopy but he is currently just scheduled for just the colon.  Please check behind me and let me know if I need to change this patient's PV paperwork.  Please/thank you Bre, PV RN

## 2024-08-06 NOTE — Telephone Encounter (Signed)
 Bre see the message regarding Plavix 

## 2024-08-06 NOTE — Telephone Encounter (Signed)
 Information noted on PV chart. Will await response on PLAVIX  hold.

## 2024-08-07 ENCOUNTER — Ambulatory Visit (INDEPENDENT_AMBULATORY_CARE_PROVIDER_SITE_OTHER): Admitting: Otolaryngology

## 2024-08-07 ENCOUNTER — Institutional Professional Consult (permissible substitution) (INDEPENDENT_AMBULATORY_CARE_PROVIDER_SITE_OTHER): Admitting: Otolaryngology

## 2024-08-07 ENCOUNTER — Ambulatory Visit: Admitting: Podiatry

## 2024-08-07 ENCOUNTER — Encounter (INDEPENDENT_AMBULATORY_CARE_PROVIDER_SITE_OTHER): Payer: Self-pay | Admitting: Otolaryngology

## 2024-08-07 VITALS — BP 103/68 | HR 98 | Ht 75.0 in | Wt 233.0 lb

## 2024-08-07 DIAGNOSIS — G4752 REM sleep behavior disorder: Secondary | ICD-10-CM | POA: Diagnosis not present

## 2024-08-07 DIAGNOSIS — G4730 Sleep apnea, unspecified: Secondary | ICD-10-CM
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
## 2024-08-10 ENCOUNTER — Other Ambulatory Visit: Payer: Self-pay

## 2024-08-11 ENCOUNTER — Ambulatory Visit (AMBULATORY_SURGERY_CENTER)

## 2024-08-11 VITALS — Ht 75.0 in | Wt 228.0 lb

## 2024-08-11 DIAGNOSIS — Z860100 Personal history of colon polyps, unspecified: Secondary | ICD-10-CM

## 2024-08-11 DIAGNOSIS — D509 Iron deficiency anemia, unspecified: Secondary | ICD-10-CM

## 2024-08-11 MED ORDER — NA SULFATE-K SULFATE-MG SULF 17.5-3.13-1.6 GM/177ML PO SOLN: 1.0000 | Freq: Once | ORAL | 0 refills | Status: AC

## 2024-08-11 NOTE — Progress Notes (Signed)
 Sent message, via epic in basket, requesting orders in epic from Careers adviser.

## 2024-08-11 NOTE — Progress Notes (Signed)
 Pre visit completed via phone call; Patient verified name, DOB, and address; No egg or soy allergy known to patient;  No issues known to pt with past sedation with any surgeries or procedures; Patient denies ever being told they had issues or difficulty with intubation;  No FH of Malignant Hyperthermia; Pt is not on diet pills; Pt is not on home 02;  Pt IS ON blood thinners - PLAVIX  - hold x 5 days prior to procedure Pt denies issues with constipation at this time, but reports he will be having knee replacement surgery 1 month prior to colonoscopy and will be on pain medications that my make him constipated;  patient advised to increase oral fluids, activity (as tolerated), and fruits/veggies (that are allowed) and to also take a stool softener or gentle laxative the week prior to colonoscopy to help prevent constipation;  No A fib or A flutter; Have any cardiac testing pending--NO Insurance verified during PV appt--- [facility] Medicare Pt can ambulate without assistance;  Pt denies use of chewing tobacco; Discussed diabetic/weight loss medication holds; Discussed NSAID holds; Checked BMI to be less than 50; Pt instructed to use Single[employer]com or GoodRx for a price reduction on prep;  Patient's chart reviewed by Norleen Schillings CNRA prior to previsit and patient appropriate for the [facility].  Pre visit completed and red dot placed by patient's name on their procedure day (on provider's schedule).   Instructions sent to MyChart as well as printed and given to patient at time of PV appt;

## 2024-08-12 ENCOUNTER — Other Ambulatory Visit: Payer: Self-pay

## 2024-08-12 NOTE — Progress Notes (Addendum)
 Anesthesia Review:  PCP: Camellia [facility] - [facility] 08/03/24  Cardiologist : Ozell Fell  D Wittenborn,Np preop 07/14/24   PPM/ ICD: Device Orders: Rep Notified:  Chest x-ray : 06/23/24- 2 view  EKG : 06/25/24  Echo : 11/29/23- echo  Stress test: 03/09/24  Cardiac Cath :   Activity level: can do a flight of stairs without difficulty  Sleep Study/ CPAP : none  Fasting Blood Sugar :      / Checks Blood Sugar -- times a day:     DM- type 2 - Freestyle Libre 3  Hgba1c-08/18/24- 7.6-routed to DR  Edna on 08/18/24.  Trulicity - last dose on 08/14/2024  Tresiba - 1/2 dose am of surgery  Actos - none am of surgery  Synjardy - last dose on 08/20/2024   Blood Thinner/ Instructions /Last Dose: ASA / Instructions/ Last Dose :   Plavix - stop one week prior per pt. Last dose on 08/17/2024 per pt    10/27- Iron , cbmp and cbc  in epic.

## 2024-08-13 ENCOUNTER — Ambulatory Visit: Payer: Self-pay | Admitting: Emergency Medicine

## 2024-08-13 DIAGNOSIS — G8929 Other chronic pain: Secondary | ICD-10-CM

## 2024-08-13 NOTE — Patient Instructions (Addendum)
 SURGICAL WAITING ROOM VISITATION  Patients having surgery or a procedure may have no more than 2 support people in the waiting area - these visitors may rotate.    Children under the age of 59 must have an adult with them who is not the patient.  Visitors with respiratory illnesses are discouraged from visiting and should remain at home.  If the patient needs to stay at the hospital during part of their recovery, the visitor guidelines for inpatient rooms apply. Pre-op nurse will coordinate an appropriate time for 1 support person to accompany patient in pre-op.  This support person may not rotate.    Please refer to the [facility] website for the visitor guidelines for Inpatients (after your surgery is over and you are in a regular room).       Your procedure is scheduled on:  08/24/2024    Report to [facility] Main Entrance    Report to admitting at  0740 AM   Call this number if you have problems the morning of surgery [phone]   Do not eat food :After Midnight.   After Midnight you may have the following liquids until __ 0700____ AM  DAY OF SURGERY  Water Non-Citrus Juices (without pulp, NO RED-Apple, White grape, White cranberry) Black Coffee (NO MILK/CREAM OR CREAMERS, sugar ok)  Clear Tea (NO MILK/CREAM OR CREAMERS, sugar ok) regular and decaf                             Plain Jell-O (NO RED)                                           Fruit ices (not with fruit pulp, NO RED)                                     Popsicles (NO RED)                                                               Sports drinks like Gatorade (NO RED)                     The day of surgery:  Drink ONE (1) Pre-Surgery Clear Ensure or G2 at   0700AM the morning of surgery. Drink in one sitting. Do not sip.  This drink was given to you during your hospital  pre-op appointment visit. Nothing else to drink after completing the  Pre-Surgery Clear Ensure or G2.          If you have  questions, please contact your surgeon's office.   FOLLOW BOWEL PREP AND ANY ADDITIONAL PRE OP INSTRUCTIONS YOU RECEIVED FROM YOUR SURGEON'S OFFICE!!!     Oral Hygiene is also important to reduce your risk of infection.                                    Remember - BRUSH YOUR TEETH THE MORNING OF SURGERY WITH YOUR  REGULAR TOOTHPASTE  DENTURES WILL BE REMOVED PRIOR TO SURGERY PLEASE DO NOT APPLY Poly grip OR ADHESIVES!!!   Do NOT smoke after Midnight   Stop all vitamins and herbal supplements 7 days before surgery.   Take these medicines the morning of surgery with A SIP OF WATER: nnhalers as usual and bring, Nebulizer if needed, zyrtec  if needed, lexapro , lyrica , eye drops as usual             Trulicity  - last dose on  Tresiba -            Actos - none am of surgeru            Synjardy - hold for 72 hours prior to surgery last dose on 08/20/2024.    DO NOT TAKE ANY ORAL DIABETIC MEDICATIONS DAY OF YOUR SURGERY  Bring CPAP mask and tubing day of surgery.                              You may not have any metal on your body including hair pins, jewelry, and body piercing             Do not wear make-up, lotions, powders, perfumes/cologne, or deodorant  Do not wear nail polish including gel and S&S, artificial/acrylic nails, or any other type of covering on natural nails including finger and toenails. If you have artificial nails, gel coating, etc. that needs to be removed by a nail salon please have this removed prior to surgery or surgery may need to be canceled/ delayed if the surgeon/ anesthesia feels like they are unable to be safely monitored.   Do not shave  48 hours prior to surgery.               Men may shave face and neck.   Do not bring valuables to the hospital. [facility] IS NOT             RESPONSIBLE   FOR VALUABLES.   Contacts, glasses, dentures or bridgework may not be worn into surgery.   Bring small overnight bag day of surgery.   DO NOT BRING YOUR HOME  MEDICATIONS TO THE HOSPITAL. PHARMACY WILL DISPENSE MEDICATIONS LISTED ON YOUR MEDICATION LIST TO YOU DURING YOUR ADMISSION IN THE HOSPITAL!    Patients discharged on the day of surgery will not be allowed to drive home.  Someone NEEDS to stay with you for the first 24 hours after anesthesia.   Special Instructions: Bring a copy of your healthcare power of attorney and living will documents the day of surgery if you haven't scanned them before.              Please read over the following fact sheets you were given: IF YOU HAVE QUESTIONS ABOUT YOUR PRE-OP INSTRUCTIONS PLEASE CALL [phone].   If you received a COVID test during your pre-op visit  it is requested that you wear a mask when out in public, stay away from anyone that may not be feeling well and notify your surgeon if you develop symptoms. If you test positive for Covid or have been in contact with anyone that has tested positive in the last 10 days please notify you surgeon.      Pre-operative 4 CHG Bath Instructions   You can play a key role in reducing the risk of infection after surgery. Your skin needs to be as free of germs as possible. You can reduce the number  of germs on your skin by washing with CHG (chlorhexidine  gluconate) soap before surgery. CHG is an antiseptic soap that kills germs and continues to kill germs even after washing.   DO NOT use if you have an allergy to chlorhexidine /CHG or antibacterial soaps. If your skin becomes reddened or irritated, stop using the CHG and notify one of our RNs at [phone].   Please shower with the CHG soap starting 4 days before surgery using the following schedule:     Please keep in mind the following:  DO NOT shave, including legs and underarms, starting the day of your first shower.   You may shave your face at any point before/day of surgery.  Place clean sheets on your bed the day you start using CHG soap. Use a clean washcloth (not used since being washed) for each  shower. DO NOT sleep with pets once you start using the CHG.   CHG Shower Instructions:  If you choose to wash your hair and private area, wash first with your normal shampoo/soap.  After you use shampoo/soap, rinse your hair and body thoroughly to remove shampoo/soap residue.  Turn the water OFF and apply about 3 tablespoons (45 ml) of CHG soap to a CLEAN washcloth.  Apply CHG soap ONLY FROM YOUR NECK DOWN TO YOUR TOES (washing for 3-5 minutes)  DO NOT use CHG soap on face, private areas, open wounds, or sores.  Pay special attention to the area where your surgery is being performed.  If you are having back surgery, having someone wash your back for you may be helpful. Wait 2 minutes after CHG soap is applied, then you may rinse off the CHG soap.  Pat dry with a clean towel  Put on clean clothes/pajamas   If you choose to wear lotion, please use ONLY the CHG-compatible lotions on the back of this paper.     Additional instructions for the day of surgery: DO NOT APPLY any lotions, deodorants, cologne, or perfumes.   Put on clean/comfortable clothes.  Brush your teeth.  Ask your nurse before applying any prescription medications to the skin.      CHG Compatible Lotions   Aveeno Moisturizing lotion  Cetaphil Moisturizing Cream  Cetaphil Moisturizing Lotion  Clairol Herbal Essence Moisturizing Lotion, Dry Skin  Clairol Herbal Essence Moisturizing Lotion, Extra Dry Skin  Clairol Herbal Essence Moisturizing Lotion, Normal Skin  Curel Age Defying Therapeutic Moisturizing Lotion with Alpha Hydroxy  Curel Extreme Care Body Lotion  Curel Soothing Hands Moisturizing Hand Lotion  Curel Therapeutic Moisturizing Cream, Fragrance-Free  Curel Therapeutic Moisturizing Lotion, Fragrance-Free  Curel Therapeutic Moisturizing Lotion, Original Formula  Eucerin Daily Replenishing Lotion  Eucerin Dry Skin Therapy Plus Alpha Hydroxy Crme  Eucerin Dry Skin Therapy Plus Alpha Hydroxy Lotion   Eucerin Original Crme  Eucerin Original Lotion  Eucerin Plus Crme Eucerin Plus Lotion  Eucerin TriLipid Replenishing Lotion  Keri Anti-Bacterial Hand Lotion  Keri Deep Conditioning Original Lotion Dry Skin Formula Softly Scented  Keri Deep Conditioning Original Lotion, Fragrance Free Sensitive Skin Formula  Keri Lotion Fast Absorbing Fragrance Free Sensitive Skin Formula  Keri Lotion Fast Absorbing Softly Scented Dry Skin Formula  Keri Original Lotion  Keri Skin Renewal Lotion Keri Silky Smooth Lotion  Keri Silky Smooth Sensitive Skin Lotion  Nivea Body Creamy Conditioning Oil  Nivea Body Extra Enriched Teacher, Adult Education Moisturizing Lotion Nivea Crme  Nivea Skin Firming Lotion  NutraDerm 30 Skin Lotion  NutraDerm Skin  Lotion  NutraDerm Therapeutic Skin Cream  NutraDerm Therapeutic Skin Lotion  ProShield Protective Hand Cream  Provon moisturizing lotion
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
## 2024-08-18 ENCOUNTER — Encounter (HOSPITAL_COMMUNITY)
Admission: RE | Admit: 2024-08-18 | Discharge: 2024-08-18 | Disposition: A | Discharge status: Home / Self Care | Source: Ambulatory Visit | Attending: Orthopedic Surgery | Admitting: Orthopedic Surgery

## 2024-08-18 ENCOUNTER — Encounter (HOSPITAL_COMMUNITY): Payer: Self-pay

## 2024-08-18 ENCOUNTER — Other Ambulatory Visit: Payer: Self-pay

## 2024-08-18 VITALS — BP 117/78 | HR 77 | Temp 98.5°F | Resp 16 | Ht 75.0 in | Wt 230.0 lb

## 2024-08-18 DIAGNOSIS — M1711 Unilateral primary osteoarthritis, right knee: Secondary | ICD-10-CM | POA: Diagnosis not present

## 2024-08-18 DIAGNOSIS — J4489 Other specified chronic obstructive pulmonary disease: Secondary | ICD-10-CM | POA: Insufficient documentation

## 2024-08-18 DIAGNOSIS — I10 Essential (primary) hypertension: Secondary | ICD-10-CM | POA: Insufficient documentation

## 2024-08-18 DIAGNOSIS — Z7985 Long-term (current) use of injectable non-insulin antidiabetic drugs: Secondary | ICD-10-CM | POA: Insufficient documentation

## 2024-08-18 DIAGNOSIS — G8929 Other chronic pain: Secondary | ICD-10-CM | POA: Diagnosis not present

## 2024-08-18 DIAGNOSIS — I080 Rheumatic disorders of both mitral and aortic valves: Secondary | ICD-10-CM | POA: Insufficient documentation

## 2024-08-18 DIAGNOSIS — Z01812 Encounter for preprocedural laboratory examination: Secondary | ICD-10-CM | POA: Insufficient documentation

## 2024-08-18 DIAGNOSIS — Z794 Long term (current) use of insulin: Secondary | ICD-10-CM | POA: Insufficient documentation

## 2024-08-18 DIAGNOSIS — I2510 Atherosclerotic heart disease of native coronary artery without angina pectoris: Secondary | ICD-10-CM | POA: Insufficient documentation

## 2024-08-18 DIAGNOSIS — Z01818 Encounter for other preprocedural examination: Secondary | ICD-10-CM

## 2024-08-18 DIAGNOSIS — E1151 Type 2 diabetes mellitus with diabetic peripheral angiopathy without gangrene: Secondary | ICD-10-CM | POA: Diagnosis not present

## 2024-08-18 DIAGNOSIS — M25561 Pain in right knee: Secondary | ICD-10-CM | POA: Diagnosis not present

## 2024-08-18 HISTORY — DX: Anemia, unspecified: D64.9

## 2024-08-18 HISTORY — DX: Cardiac murmur, unspecified: R01.1

## 2024-08-18 LAB — SURGICAL PCR SCREEN
MRSA, PCR: NEGATIVE
Staphylococcus aureus: NEGATIVE

## 2024-08-18 LAB — HEMOGLOBIN A1C
Hgb A1c MFr Bld: 7.6 % — ABNORMAL HIGH (ref 4.8–5.6)
Mean Plasma Glucose: 171.42 mg/dL

## 2024-08-19 ENCOUNTER — Encounter (HOSPITAL_COMMUNITY): Payer: Self-pay
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
## 2024-08-19 NOTE — Care Plan (Signed)
 Ortho Bundle Case Management Note  Patient Details  Name: Richard [name] MRN: [id number] Date of Birth: 10-29-1960  met with patient in the office for H&P. will discharge to home with family to assist. rolling walker ordered for home use. OPPT set up with [facility] discharge instructions discussed and questions answered. Patient and MD in agreement with plan. Choice offered               DME Arranged:  Walker rolling DME Agency:  [employer]  HH Arranged:    HH Agency:     Additional Comments: Please contact me with any questions of if this plan should need to change.  Charlies Pitch,  RN,BSN,MHA,CCM  [facility]  [phone] 08/19/2024, 3:53 PM

## 2024-08-20 ENCOUNTER — Encounter: Payer: Self-pay | Admitting: Pulmonary Disease

## 2024-08-20 ENCOUNTER — Ambulatory Visit: Admitting: Pulmonary Disease

## 2024-08-20 VITALS — BP 118/74 | HR 83 | Ht 75.0 in | Wt 228.0 lb

## 2024-08-20 DIAGNOSIS — G4752 REM sleep behavior disorder: Secondary | ICD-10-CM

## 2024-08-20 DIAGNOSIS — R0602 Shortness of breath: Secondary | ICD-10-CM | POA: Diagnosis not present

## 2024-08-20 DIAGNOSIS — J45901 Unspecified asthma with (acute) exacerbation: Secondary | ICD-10-CM | POA: Diagnosis not present

## 2024-08-20 DIAGNOSIS — J8283 Eosinophilic asthma: Secondary | ICD-10-CM

## 2024-08-20 DIAGNOSIS — J4551 Severe persistent asthma with (acute) exacerbation: Secondary | ICD-10-CM

## 2024-08-20 MED ORDER — CLONAZEPAM 0.5 MG PO TABS: 0.5000 mg | ORAL_TABLET | Freq: Every day | ORAL | 1 refills | Status: DC

## 2024-08-20 MED ORDER — BUDESONIDE 0.5 MG/2ML IN SUSP: 0.5000 mg | Freq: Two times a day (BID) | RESPIRATORY_TRACT | 1 refills | Status: DC

## 2024-08-20 NOTE — Addendum Note (Signed)
 Addended by: ALINDA WINN KIDD on: 08/20/2024 11:04 AM   Modules accepted: Orders

## 2024-08-20 NOTE — Patient Instructions (Addendum)
 Follow-up in about 3 months  Your sleep study was significant for a lot of movement, REM behavior disorder -Melatonin 10 mg at night, can go up to 15 -Put in the prescription for Klonopin to be started at 0.5 mg about 30 minutes before bedtime and may go up to 1 mg after about a week -Significant side effect can be sleepiness, tiredness -Make sure you are cautious about increasing the dose  Make sure you keep a safe environment  Continue using your inhalers  Referral for psychotherapy with: Behavioral health  For your asthma that is acting up continue using a rescue inhaler Put in a prescription for Pulmicort  which is a steroid nebulizer to be used about twice a day  You should be good for your surgery as long as you are feeling optimal prior to the surgery  If you are feeling any worse, surgery may need to be rescheduled, there is a risk of ending up on a ventilator and needing to stay on the ventilator if you are doing very poorly before any surgery
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
## 2024-08-20 NOTE — Anesthesia Preprocedure Evaluation (Addendum)
 Anesthesia Evaluation  Patient identified by MRN, date of birth, ID band Patient awake    Reviewed: Allergy & Precautions, NPO status , Patient's Chart, lab work & pertinent test results  History of Anesthesia Complications Negative for: history of anesthetic complications  Airway Mallampati: II  TM Distance: >3 FB Neck ROM: Full    Dental  (+) Dental Advisory Given, Edentulous Upper, Edentulous Lower   Pulmonary asthma , COPD, former smoker    + wheezing      Cardiovascular hypertension, + CAD and + Peripheral Vascular Disease  + Valvular Problems/Murmurs AS  Rhythm:Regular Rate:Normal + Systolic murmurs TTE (11/2023):   1. Left ventricular ejection fraction, by estimation, is 60 to 65%. The  left ventricle has normal function. The left ventricle has no regional  wall motion abnormalities. There is mild asymmetric left ventricular  hypertrophy of the basal-septal segment. Left ventricular diastolic parameters are consistent with Grade I diastolic dysfunction (impaired relaxation).   2. Right ventricular systolic function is normal. The right ventricular  size is normal. Tricuspid regurgitation signal is inadequate for assessing  PA pressure.   3. The mitral valve is abnormal, possibly rheumatic. Trivial mitral valve  regurgitation. Moderate mitral stenosis. The mean mitral valve gradient is  7.0 mmHg. Moderate to severe mitral annular calcification.   4. The aortic valve is tricuspid. There is moderate calcification of the  aortic valve. Aortic valve regurgitation is not visualized. Mild aortic  valve stenosis. Aortic valve area, by VTI measures 1.89 cm. Aortic valve  mean gradient measures 12.0 mmHg.   5. The inferior vena cava is dilated in size with <50% respiratory  variability, suggesting right atrial pressure of 15 mmHg.     Neuro/Psych  PSYCHIATRIC DISORDERS Anxiety Depression       GI/Hepatic ,,,(+)     substance  abuse (30+ years ago)  cocaine use  Endo/Other  diabetes (A1c of 7.6), Type 2    Renal/GU      Musculoskeletal  (+) Arthritis ,    Abdominal   Peds  Hematology  (+) Blood dyscrasia, anemia   Anesthesia Other Findings   Reproductive/Obstetrics                              Anesthesia Physical Anesthesia Plan  ASA: 3  Anesthesia Plan: General   Post-op Pain Management:    Induction: Intravenous  PONV Risk Score and Plan: 2 and Ondansetron , Dexamethasone  and Treatment may vary due to age or medical condition  Airway Management Planned: Oral ETT  Additional Equipment: None  Intra-op Plan:   Post-operative Plan: Extubation in OR  Informed Consent:      Dental advisory given  Plan Discussed with: CRNA and Surgeon  Anesthesia Plan Comments: (See PAT note from 11/11. Patient not a candidate for neuraxial anesthesia due to valvular stenosis and patient refused peripheral nerve block. )         Anesthesia Quick Evaluation

## 2024-08-24 ENCOUNTER — Ambulatory Visit (HOSPITAL_COMMUNITY): Payer: Self-pay | Admitting: Anesthesiology

## 2024-08-24 ENCOUNTER — Other Ambulatory Visit: Payer: Self-pay

## 2024-08-24 ENCOUNTER — Encounter (HOSPITAL_COMMUNITY): Payer: Self-pay | Admitting: Orthopedic Surgery

## 2024-08-24 ENCOUNTER — Observation Stay (HOSPITAL_COMMUNITY)

## 2024-08-24 ENCOUNTER — Encounter (HOSPITAL_COMMUNITY)
Admission: RE | Disposition: A | Discharge status: Home / Self Care | Payer: Self-pay | Source: Home / Self Care | Attending: Orthopedic Surgery

## 2024-08-24 ENCOUNTER — Ambulatory Visit (HOSPITAL_COMMUNITY): Payer: Self-pay | Admitting: Medical

## 2024-08-24 ENCOUNTER — Inpatient Hospital Stay (HOSPITAL_COMMUNITY)
Admission: RE | Admit: 2024-08-24 | Discharge: 2024-08-28 | DRG: 470 | Disposition: A | Discharge status: Home / Self Care | Attending: Orthopedic Surgery | Admitting: Orthopedic Surgery

## 2024-08-24 DIAGNOSIS — Z7984 Long term (current) use of oral hypoglycemic drugs: Secondary | ICD-10-CM

## 2024-08-24 DIAGNOSIS — F3342 Major depressive disorder, recurrent, in full remission: Secondary | ICD-10-CM | POA: Diagnosis present

## 2024-08-24 DIAGNOSIS — Z8249 Family history of ischemic heart disease and other diseases of the circulatory system: Secondary | ICD-10-CM

## 2024-08-24 DIAGNOSIS — H409 Unspecified glaucoma: Secondary | ICD-10-CM | POA: Diagnosis present

## 2024-08-24 DIAGNOSIS — Z9889 Other specified postprocedural states: Secondary | ICD-10-CM

## 2024-08-24 DIAGNOSIS — E1165 Type 2 diabetes mellitus with hyperglycemia: Secondary | ICD-10-CM | POA: Diagnosis present

## 2024-08-24 DIAGNOSIS — E785 Hyperlipidemia, unspecified: Secondary | ICD-10-CM | POA: Diagnosis present

## 2024-08-24 DIAGNOSIS — Z7985 Long-term (current) use of injectable non-insulin antidiabetic drugs: Secondary | ICD-10-CM

## 2024-08-24 DIAGNOSIS — R011 Cardiac murmur, unspecified: Secondary | ICD-10-CM | POA: Diagnosis present

## 2024-08-24 DIAGNOSIS — I10 Essential (primary) hypertension: Secondary | ICD-10-CM

## 2024-08-24 DIAGNOSIS — M1711 Unilateral primary osteoarthritis, right knee: Secondary | ICD-10-CM

## 2024-08-24 DIAGNOSIS — D509 Iron deficiency anemia, unspecified: Secondary | ICD-10-CM | POA: Diagnosis present

## 2024-08-24 DIAGNOSIS — Z83511 Family history of glaucoma: Secondary | ICD-10-CM

## 2024-08-24 DIAGNOSIS — I080 Rheumatic disorders of both mitral and aortic valves: Secondary | ICD-10-CM | POA: Diagnosis present

## 2024-08-24 DIAGNOSIS — Z87891 Personal history of nicotine dependence: Secondary | ICD-10-CM

## 2024-08-24 DIAGNOSIS — Z89432 Acquired absence of left foot: Secondary | ICD-10-CM

## 2024-08-24 DIAGNOSIS — Z8719 Personal history of other diseases of the digestive system: Secondary | ICD-10-CM

## 2024-08-24 DIAGNOSIS — Z79899 Other long term (current) drug therapy: Secondary | ICD-10-CM

## 2024-08-24 DIAGNOSIS — E1151 Type 2 diabetes mellitus with diabetic peripheral angiopathy without gangrene: Secondary | ICD-10-CM | POA: Diagnosis present

## 2024-08-24 DIAGNOSIS — Z825 Family history of asthma and other chronic lower respiratory diseases: Secondary | ICD-10-CM

## 2024-08-24 DIAGNOSIS — Z7951 Long term (current) use of inhaled steroids: Secondary | ICD-10-CM

## 2024-08-24 DIAGNOSIS — I739 Peripheral vascular disease, unspecified: Secondary | ICD-10-CM

## 2024-08-24 DIAGNOSIS — J4489 Other specified chronic obstructive pulmonary disease: Secondary | ICD-10-CM | POA: Diagnosis present

## 2024-08-24 DIAGNOSIS — G8929 Other chronic pain: Secondary | ICD-10-CM

## 2024-08-24 DIAGNOSIS — I700 Atherosclerosis of aorta: Secondary | ICD-10-CM | POA: Diagnosis present

## 2024-08-24 DIAGNOSIS — E1140 Type 2 diabetes mellitus with diabetic neuropathy, unspecified: Secondary | ICD-10-CM | POA: Diagnosis present

## 2024-08-24 DIAGNOSIS — Z9049 Acquired absence of other specified parts of digestive tract: Secondary | ICD-10-CM

## 2024-08-24 DIAGNOSIS — Z7902 Long term (current) use of antithrombotics/antiplatelets: Secondary | ICD-10-CM

## 2024-08-24 DIAGNOSIS — I2510 Atherosclerotic heart disease of native coronary artery without angina pectoris: Secondary | ICD-10-CM | POA: Diagnosis not present

## 2024-08-24 DIAGNOSIS — Z7982 Long term (current) use of aspirin: Secondary | ICD-10-CM

## 2024-08-24 DIAGNOSIS — M25761 Osteophyte, right knee: Secondary | ICD-10-CM | POA: Diagnosis present

## 2024-08-24 DIAGNOSIS — Z860101 Personal history of adenomatous and serrated colon polyps: Secondary | ICD-10-CM

## 2024-08-24 DIAGNOSIS — Z794 Long term (current) use of insulin: Secondary | ICD-10-CM

## 2024-08-24 DIAGNOSIS — Z01818 Encounter for other preprocedural examination: Secondary | ICD-10-CM

## 2024-08-24 DIAGNOSIS — Z8603 Personal history of neoplasm of uncertain behavior: Secondary | ICD-10-CM

## 2024-08-24 DIAGNOSIS — K08109 Complete loss of teeth, unspecified cause, unspecified class: Secondary | ICD-10-CM | POA: Diagnosis present

## 2024-08-24 DIAGNOSIS — Z791 Long term (current) use of non-steroidal anti-inflammatories (NSAID): Secondary | ICD-10-CM

## 2024-08-24 DIAGNOSIS — Z91013 Allergy to seafood: Secondary | ICD-10-CM

## 2024-08-24 DIAGNOSIS — Z8614 Personal history of Methicillin resistant Staphylococcus aureus infection: Secondary | ICD-10-CM

## 2024-08-24 DIAGNOSIS — Z87892 Personal history of anaphylaxis: Secondary | ICD-10-CM

## 2024-08-24 DIAGNOSIS — F419 Anxiety disorder, unspecified: Secondary | ICD-10-CM | POA: Diagnosis present

## 2024-08-24 DIAGNOSIS — E119 Type 2 diabetes mellitus without complications: Secondary | ICD-10-CM

## 2024-08-24 DIAGNOSIS — Z8619 Personal history of other infectious and parasitic diseases: Secondary | ICD-10-CM

## 2024-08-24 HISTORY — PX: TOTAL KNEE ARTHROPLASTY: SHX125

## 2024-08-24 LAB — TYPE AND SCREEN
ABO/RH(D): A POS
Antibody Screen: NEGATIVE

## 2024-08-24 LAB — GLUCOSE, CAPILLARY
Glucose-Capillary: 240 mg/dL — ABNORMAL HIGH (ref 70–99)
Glucose-Capillary: 176 mg/dL — ABNORMAL HIGH (ref 70–99)
Glucose-Capillary: 154 mg/dL — ABNORMAL HIGH (ref 70–99)

## 2024-08-24 SURGERY — ARTHROPLASTY, KNEE, TOTAL
Anesthesia: General | Site: Knee | Laterality: Right

## 2024-08-24 MED ORDER — SODIUM CHLORIDE (PF) 0.9 % IJ SOLN
INTRAMUSCULAR | Status: DC | PRN
  Administered 2024-08-24: 80 mL

## 2024-08-24 MED ORDER — ATORVASTATIN CALCIUM 40 MG PO TABS
40.0000 mg | ORAL_TABLET | Freq: Every day | ORAL | Status: DC
  Administered 2024-08-25 – 2024-08-28 (×4): 40 mg via ORAL
  Filled 2024-08-24 (×4): qty 1

## 2024-08-24 MED ORDER — ONDANSETRON HCL 4 MG/2ML IJ SOLN
4.0000 mg | Freq: Four times a day (QID) | INTRAMUSCULAR | Status: DC | PRN
  Administered 2024-08-27: 4 mg via INTRAVENOUS
  Filled 2024-08-24: qty 2

## 2024-08-24 MED ORDER — CLOPIDOGREL BISULFATE 75 MG PO TABS
75.0000 mg | ORAL_TABLET | Freq: Every day | ORAL | Status: DC
  Administered 2024-08-26 – 2024-08-28 (×3): 75 mg via ORAL
  Filled 2024-08-24 (×3): qty 1

## 2024-08-24 MED ORDER — PHENYLEPHRINE HCL (PRESSORS) 10 MG/ML IV SOLN
INTRAVENOUS | Status: AC
  Filled 2024-08-24: qty 1

## 2024-08-24 MED ORDER — ASPIRIN 81 MG PO TBEC: 81.0000 mg | DELAYED_RELEASE_TABLET | Freq: Two times a day (BID) | ORAL | Status: AC

## 2024-08-24 MED ORDER — LORATADINE 10 MG PO TABS
10.0000 mg | ORAL_TABLET | Freq: Every day | ORAL | Status: DC
  Administered 2024-08-25 – 2024-08-28 (×4): 10 mg via ORAL
  Filled 2024-08-24 (×4): qty 1

## 2024-08-24 MED ORDER — ISOPROPYL ALCOHOL 70 % SOLN
Status: AC
Start: 2024-08-24 — End: 2024-08-24
  Filled 2024-08-24: qty 480

## 2024-08-24 MED ORDER — DOCUSATE SODIUM 100 MG PO CAPS
100.0000 mg | ORAL_CAPSULE | Freq: Two times a day (BID) | ORAL | Status: DC
  Administered 2024-08-24 – 2024-08-28 (×8): 100 mg via ORAL
  Filled 2024-08-24 (×8): qty 1

## 2024-08-24 MED ORDER — PHENYLEPHRINE HCL (PRESSORS) 10 MG/ML IV SOLN
INTRAVENOUS | Status: AC
Start: 2024-08-24 — End: 2024-08-24
  Filled 2024-08-24: qty 1

## 2024-08-24 MED ORDER — MELOXICAM 15 MG PO TABS: 15.0000 mg | ORAL_TABLET | Freq: Every day | ORAL | 0 refills | Status: DC

## 2024-08-24 MED ORDER — DIPHENHYDRAMINE HCL 12.5 MG/5ML PO ELIX: 12.5000 mg | ORAL_SOLUTION | ORAL | Status: DC | PRN

## 2024-08-24 MED ORDER — ACETAMINOPHEN 500 MG PO TABS: 1000.0000 mg | ORAL_TABLET | Freq: Three times a day (TID) | ORAL | Status: AC | PRN

## 2024-08-24 MED ORDER — ISOPROPYL ALCOHOL 70 % SOLN
Status: DC | PRN
  Administered 2024-08-24: 1 via TOPICAL

## 2024-08-24 MED ORDER — WATER FOR IRRIGATION, STERILE IR SOLN
Status: DC | PRN
  Administered 2024-08-24: 2000 mL

## 2024-08-24 MED ORDER — IPRATROPIUM-ALBUTEROL 0.5-2.5 (3) MG/3ML IN SOLN
RESPIRATORY_TRACT | Status: AC
  Filled 2024-08-24: qty 3

## 2024-08-24 MED ORDER — PREGABALIN 100 MG PO CAPS
100.0000 mg | ORAL_CAPSULE | Freq: Three times a day (TID) | ORAL | Status: DC
  Administered 2024-08-24 – 2024-08-28 (×11): 100 mg via ORAL
  Filled 2024-08-24 (×11): qty 1

## 2024-08-24 MED ORDER — ACETAMINOPHEN 500 MG PO TABS
1000.0000 mg | ORAL_TABLET | Freq: Four times a day (QID) | ORAL | Status: AC
  Administered 2024-08-24 – 2024-08-25 (×4): 1000 mg via ORAL
  Filled 2024-08-24 (×4): qty 2

## 2024-08-24 MED ORDER — ORAL CARE MOUTH RINSE: 15.0000 mL | Freq: Once | OROMUCOSAL | Status: AC

## 2024-08-24 MED ORDER — ONDANSETRON HCL 4 MG PO TABS: 4.0000 mg | ORAL_TABLET | Freq: Three times a day (TID) | ORAL | 0 refills | Status: AC | PRN

## 2024-08-24 MED ORDER — INSULIN ASPART 100 UNIT/ML IJ SOLN
0.0000 [IU] | Freq: Three times a day (TID) | INTRAMUSCULAR | Status: DC
  Administered 2024-08-25: 8 [IU] via SUBCUTANEOUS
  Administered 2024-08-25: 5 [IU] via SUBCUTANEOUS
  Administered 2024-08-25: 11 [IU] via SUBCUTANEOUS
  Administered 2024-08-26 (×3): 3 [IU] via SUBCUTANEOUS
  Administered 2024-08-27: 2 [IU] via SUBCUTANEOUS
  Administered 2024-08-27: 5 [IU] via SUBCUTANEOUS
  Administered 2024-08-28: 3 [IU] via SUBCUTANEOUS
  Filled 2024-08-24: qty 8
  Filled 2024-08-24: qty 3
  Filled 2024-08-24: qty 5
  Filled 2024-08-24: qty 8
  Filled 2024-08-24: qty 11
  Filled 2024-08-24: qty 2
  Filled 2024-08-24: qty 5
  Filled 2024-08-24 (×3): qty 3

## 2024-08-24 MED ORDER — VANCOMYCIN HCL 1500 MG/300ML IV SOLN
1500.0000 mg | Freq: Once | INTRAVENOUS | Status: AC
  Administered 2024-08-24: 1500 mg via INTRAVENOUS
  Filled 2024-08-24: qty 300

## 2024-08-24 MED ORDER — OXYCODONE HCL 5 MG PO TABS
5.0000 mg | ORAL_TABLET | ORAL | Status: DC | PRN
  Administered 2024-08-24: 5 mg via ORAL
  Administered 2024-08-24 – 2024-08-28 (×14): 10 mg via ORAL
  Filled 2024-08-24 (×16): qty 2
  Filled 2024-08-24: qty 1

## 2024-08-24 MED ORDER — FENTANYL CITRATE (PF) 100 MCG/2ML IJ SOLN
INTRAMUSCULAR | Status: DC | PRN
  Administered 2024-08-24: 100 ug via INTRAVENOUS
  Administered 2024-08-24 (×2): 50 ug via INTRAVENOUS

## 2024-08-24 MED ORDER — METHOCARBAMOL 500 MG PO TABS
ORAL_TABLET | ORAL | Status: AC
  Filled 2024-08-24: qty 1

## 2024-08-24 MED ORDER — FENTANYL CITRATE (PF) 100 MCG/2ML IJ SOLN
INTRAMUSCULAR | Status: AC
  Filled 2024-08-24: qty 2

## 2024-08-24 MED ORDER — CEFAZOLIN SODIUM-DEXTROSE 2-4 GM/100ML-% IV SOLN
2.0000 g | Freq: Four times a day (QID) | INTRAVENOUS | Status: AC
  Administered 2024-08-24 – 2024-08-25 (×2): 2 g via INTRAVENOUS
  Filled 2024-08-24 (×2): qty 100

## 2024-08-24 MED ORDER — CLONAZEPAM 0.5 MG PO TABS
0.5000 mg | ORAL_TABLET | Freq: Every day | ORAL | Status: DC
  Administered 2024-08-24 – 2024-08-27 (×4): 0.5 mg via ORAL
  Filled 2024-08-24 (×4): qty 1

## 2024-08-24 MED ORDER — SODIUM CHLORIDE 0.9 % IR SOLN
Status: DC | PRN
  Administered 2024-08-24: 3000 mL

## 2024-08-24 MED ORDER — ASPIRIN 81 MG PO CHEW
81.0000 mg | CHEWABLE_TABLET | Freq: Two times a day (BID) | ORAL | Status: DC
  Administered 2024-08-24 – 2024-08-28 (×8): 81 mg via ORAL
  Filled 2024-08-24 (×8): qty 1

## 2024-08-24 MED ORDER — POLYETHYLENE GLYCOL 3350 17 G PO PACK: 17.0000 g | PACK | Freq: Every day | ORAL | 0 refills | Status: DC

## 2024-08-24 MED ORDER — ESCITALOPRAM OXALATE 10 MG PO TABS
5.0000 mg | ORAL_TABLET | Freq: Every day | ORAL | Status: DC
  Administered 2024-08-25 – 2024-08-28 (×4): 5 mg via ORAL
  Filled 2024-08-24 (×4): qty 1

## 2024-08-24 MED ORDER — LACTATED RINGERS IV SOLN: INTRAVENOUS | Status: DC

## 2024-08-24 MED ORDER — OXYCODONE HCL 5 MG/5ML PO SOLN: 5.0000 mg | Freq: Once | ORAL | Status: AC | PRN

## 2024-08-24 MED ORDER — METHOCARBAMOL 500 MG PO TABS: 500.0000 mg | ORAL_TABLET | Freq: Three times a day (TID) | ORAL | 0 refills | Status: AC | PRN

## 2024-08-24 MED ORDER — MIDAZOLAM HCL 2 MG/2ML IJ SOLN
INTRAMUSCULAR | Status: AC
  Filled 2024-08-24: qty 2

## 2024-08-24 MED ORDER — AMISULPRIDE (ANTIEMETIC) 5 MG/2ML IV SOLN
10.0000 mg | Freq: Once | INTRAVENOUS | Status: AC | PRN
  Administered 2024-08-24: 10 mg via INTRAVENOUS

## 2024-08-24 MED ORDER — ALBUTEROL SULFATE HFA 108 (90 BASE) MCG/ACT IN AERS
INHALATION_SPRAY | RESPIRATORY_TRACT | Status: AC
  Filled 2024-08-24: qty 6.7

## 2024-08-24 MED ORDER — IPRATROPIUM-ALBUTEROL 0.5-2.5 (3) MG/3ML IN SOLN
3.0000 mL | Freq: Once | RESPIRATORY_TRACT | Status: AC
  Administered 2024-08-24: 3 mL via RESPIRATORY_TRACT

## 2024-08-24 MED ORDER — INSULIN DEGLUDEC 100 UNIT/ML ~~LOC~~ SOPN: 20.0000 [IU] | PEN_INJECTOR | Freq: Every day | SUBCUTANEOUS | Status: DC

## 2024-08-24 MED ORDER — MENTHOL 3 MG MT LOZG: 1.0000 | LOZENGE | OROMUCOSAL | Status: DC | PRN

## 2024-08-24 MED ORDER — KETOROLAC TROMETHAMINE 15 MG/ML IJ SOLN
7.5000 mg | Freq: Four times a day (QID) | INTRAMUSCULAR | Status: AC
  Administered 2024-08-24 – 2024-08-25 (×4): 7.5 mg via INTRAVENOUS
  Filled 2024-08-24 (×4): qty 1

## 2024-08-24 MED ORDER — ACETAMINOPHEN 325 MG PO TABS
325.0000 mg | ORAL_TABLET | Freq: Four times a day (QID) | ORAL | Status: DC | PRN
  Administered 2024-08-26: 650 mg via ORAL
  Filled 2024-08-24: qty 2

## 2024-08-24 MED ORDER — EMPAGLIFLOZIN-METFORMIN HCL 12.5-1000 MG PO TABS: 1.0000 | ORAL_TABLET | Freq: Two times a day (BID) | ORAL | Status: DC

## 2024-08-24 MED ORDER — CHLORHEXIDINE GLUCONATE 0.12 % MT SOLN
15.0000 mL | Freq: Once | OROMUCOSAL | Status: AC
  Administered 2024-08-24: 15 mL via OROMUCOSAL

## 2024-08-24 MED ORDER — OXYCODONE HCL 5 MG PO TABS
5.0000 mg | ORAL_TABLET | Freq: Once | ORAL | Status: AC | PRN
  Administered 2024-08-24: 5 mg via ORAL

## 2024-08-24 MED ORDER — CEFAZOLIN SODIUM-DEXTROSE 2-4 GM/100ML-% IV SOLN
2.0000 g | INTRAVENOUS | Status: AC
  Administered 2024-08-24: 2 g via INTRAVENOUS
  Filled 2024-08-24: qty 100

## 2024-08-24 MED ORDER — TRULICITY 4.5 MG/0.5ML ~~LOC~~ SOAJ: 4.5000 mg | SUBCUTANEOUS | Status: DC

## 2024-08-24 MED ORDER — TIMOLOL MALEATE 0.5 % OP SOLN
1.0000 [drp] | Freq: Two times a day (BID) | OPHTHALMIC | Status: DC
  Administered 2024-08-24 – 2024-08-28 (×8): 1 [drp] via OPHTHALMIC
  Filled 2024-08-24: qty 5

## 2024-08-24 MED ORDER — 0.9 % SODIUM CHLORIDE (POUR BTL) OPTIME
TOPICAL | Status: DC | PRN
  Administered 2024-08-24: 1000 mL

## 2024-08-24 MED ORDER — TRANEXAMIC ACID-NACL 1000-0.7 MG/100ML-% IV SOLN
1000.0000 mg | INTRAVENOUS | Status: DC
  Filled 2024-08-24: qty 100

## 2024-08-24 MED ORDER — FENTANYL CITRATE (PF) 50 MCG/ML IJ SOSY
25.0000 ug | PREFILLED_SYRINGE | INTRAMUSCULAR | Status: DC | PRN
  Administered 2024-08-24 (×3): 50 ug via INTRAVENOUS

## 2024-08-24 MED ORDER — CLOPIDOGREL BISULFATE 75 MG PO TABS: 75.0000 mg | ORAL_TABLET | Freq: Every day | ORAL | Status: AC

## 2024-08-24 MED ORDER — OMEPRAZOLE 40 MG PO CPDR: 40.0000 mg | DELAYED_RELEASE_CAPSULE | Freq: Every day | ORAL | 0 refills | Status: DC

## 2024-08-24 MED ORDER — PHENYLEPHRINE HCL-NACL 20-0.9 MG/250ML-% IV SOLN
INTRAVENOUS | Status: DC | PRN
  Administered 2024-08-24: 40 ug/min via INTRAVENOUS

## 2024-08-24 MED ORDER — MONTELUKAST SODIUM 10 MG PO TABS
10.0000 mg | ORAL_TABLET | Freq: Every day | ORAL | Status: DC
  Administered 2024-08-24 – 2024-08-27 (×4): 10 mg via ORAL
  Filled 2024-08-24 (×4): qty 1

## 2024-08-24 MED ORDER — ROCURONIUM BROMIDE 10 MG/ML (PF) SYRINGE
PREFILLED_SYRINGE | INTRAVENOUS | Status: DC | PRN
  Administered 2024-08-24: 60 mg via INTRAVENOUS

## 2024-08-24 MED ORDER — FENTANYL CITRATE (PF) 50 MCG/ML IJ SOSY
PREFILLED_SYRINGE | INTRAMUSCULAR | Status: AC
  Filled 2024-08-24: qty 3

## 2024-08-24 MED ORDER — ONDANSETRON HCL 4 MG PO TABS: 4.0000 mg | ORAL_TABLET | Freq: Four times a day (QID) | ORAL | Status: DC | PRN

## 2024-08-24 MED ORDER — METFORMIN HCL 500 MG PO TABS
1000.0000 mg | ORAL_TABLET | Freq: Two times a day (BID) | ORAL | Status: DC
  Administered 2024-08-25 – 2024-08-28 (×7): 1000 mg via ORAL
  Filled 2024-08-24 (×7): qty 2

## 2024-08-24 MED ORDER — PIOGLITAZONE HCL 15 MG PO TABS
30.0000 mg | ORAL_TABLET | Freq: Every day | ORAL | Status: DC
  Administered 2024-08-25 – 2024-08-28 (×4): 30 mg via ORAL
  Filled 2024-08-24 (×4): qty 2

## 2024-08-24 MED ORDER — ZOLPIDEM TARTRATE 5 MG PO TABS: 5.0000 mg | ORAL_TABLET | Freq: Every evening | ORAL | Status: DC | PRN

## 2024-08-24 MED ORDER — BUDESONIDE 0.5 MG/2ML IN SUSP: 0.5000 mg | Freq: Two times a day (BID) | RESPIRATORY_TRACT | Status: DC | PRN

## 2024-08-24 MED ORDER — BUPIVACAINE LIPOSOME 1.3 % IJ SUSP: 20.0000 mL | Freq: Once | INTRAMUSCULAR | Status: DC

## 2024-08-24 MED ORDER — POLYETHYLENE GLYCOL 3350 17 G PO PACK
17.0000 g | PACK | Freq: Every day | ORAL | Status: DC | PRN
Start: 2024-08-24 — End: 2024-08-28
  Administered 2024-08-27: 17 g via ORAL
  Filled 2024-08-24 (×2): qty 1

## 2024-08-24 MED ORDER — SODIUM CHLORIDE (PF) 0.9 % IJ SOLN
INTRAMUSCULAR | Status: AC
  Filled 2024-08-24: qty 30

## 2024-08-24 MED ORDER — MIDAZOLAM HCL 5 MG/5ML IJ SOLN
INTRAMUSCULAR | Status: DC | PRN
  Administered 2024-08-24: 2 mg via INTRAVENOUS

## 2024-08-24 MED ORDER — SUGAMMADEX SODIUM 200 MG/2ML IV SOLN
INTRAVENOUS | Status: DC | PRN
Start: 2024-08-24 — End: 2024-08-24
  Administered 2024-08-24: 200 mg via INTRAVENOUS

## 2024-08-24 MED ORDER — PANTOPRAZOLE SODIUM 40 MG PO TBEC
40.0000 mg | DELAYED_RELEASE_TABLET | Freq: Every day | ORAL | Status: DC
  Administered 2024-08-25 – 2024-08-28 (×4): 40 mg via ORAL
  Filled 2024-08-24 (×4): qty 1

## 2024-08-24 MED ORDER — IPRATROPIUM-ALBUTEROL 0.5-2.5 (3) MG/3ML IN SOLN: 3.0000 mL | Freq: Four times a day (QID) | RESPIRATORY_TRACT | Status: DC | PRN

## 2024-08-24 MED ORDER — INSULIN ASPART 100 UNIT/ML IJ SOLN
0.0000 [IU] | INTRAMUSCULAR | Status: DC | PRN
  Administered 2024-08-24: 6 [IU] via SUBCUTANEOUS
  Filled 2024-08-24: qty 6

## 2024-08-24 MED ORDER — METHOCARBAMOL 1000 MG/10ML IJ SOLN
500.0000 mg | Freq: Four times a day (QID) | INTRAMUSCULAR | Status: DC | PRN
Start: 2024-08-24 — End: 2024-08-28

## 2024-08-24 MED ORDER — ONDANSETRON HCL 4 MG/2ML IJ SOLN
INTRAMUSCULAR | Status: DC | PRN
  Administered 2024-08-24: 4 mg via INTRAVENOUS

## 2024-08-24 MED ORDER — ACETAMINOPHEN 10 MG/ML IV SOLN: 1000.0000 mg | Freq: Once | INTRAVENOUS | Status: DC | PRN

## 2024-08-24 MED ORDER — OXYCODONE HCL 5 MG PO TABS: 5.0000 mg | ORAL_TABLET | ORAL | 0 refills | Status: AC | PRN

## 2024-08-24 MED ORDER — DEXAMETHASONE SOD PHOSPHATE PF 10 MG/ML IJ SOLN
4.0000 mg | Freq: Once | INTRAMUSCULAR | Status: AC
  Administered 2024-08-24: 10 mg via INTRAVENOUS

## 2024-08-24 MED ORDER — INSULIN GLARGINE-YFGN 100 UNIT/ML ~~LOC~~ SOLN
20.0000 [IU] | Freq: Every day | SUBCUTANEOUS | Status: DC
  Administered 2024-08-25: 20 [IU] via SUBCUTANEOUS
  Filled 2024-08-24: qty 0.2

## 2024-08-24 MED ORDER — POVIDONE-IODINE 10 % EX SWAB: 2.0000 | Freq: Once | CUTANEOUS | Status: DC

## 2024-08-24 MED ORDER — ACETAMINOPHEN 500 MG PO TABS
1000.0000 mg | ORAL_TABLET | Freq: Once | ORAL | Status: AC
  Administered 2024-08-24: 1000 mg via ORAL
  Filled 2024-08-24: qty 2

## 2024-08-24 MED ORDER — OXYCODONE HCL 5 MG PO TABS
ORAL_TABLET | ORAL | Status: AC
  Filled 2024-08-24: qty 1

## 2024-08-24 MED ORDER — BUDESON-GLYCOPYRROL-FORMOTEROL 160-9-4.8 MCG/ACT IN AERO
2.0000 | INHALATION_SPRAY | Freq: Two times a day (BID) | RESPIRATORY_TRACT | Status: DC
  Administered 2024-08-25 – 2024-08-26 (×3): 2 via RESPIRATORY_TRACT
  Filled 2024-08-24: qty 5.9

## 2024-08-24 MED ORDER — HYDROMORPHONE HCL 1 MG/ML IJ SOLN
0.5000 mg | INTRAMUSCULAR | Status: DC | PRN
  Administered 2024-08-24 – 2024-08-28 (×9): 1 mg via INTRAVENOUS
  Filled 2024-08-24 (×9): qty 1

## 2024-08-24 MED ORDER — PHENYLEPHRINE 80 MCG/ML (10ML) SYRINGE FOR IV PUSH (FOR BLOOD PRESSURE SUPPORT)
PREFILLED_SYRINGE | INTRAVENOUS | Status: DC | PRN
  Administered 2024-08-24 (×5): 80 ug via INTRAVENOUS

## 2024-08-24 MED ORDER — LIDOCAINE 2% (20 MG/ML) 5 ML SYRINGE
INTRAMUSCULAR | Status: DC | PRN
Start: 2024-08-24 — End: 2024-08-24
  Administered 2024-08-24: 100 mg via INTRAVENOUS

## 2024-08-24 MED ORDER — VANCOMYCIN HCL IN DEXTROSE 1-5 GM/200ML-% IV SOLN
1000.0000 mg | Freq: Once | INTRAVENOUS | Status: AC
  Administered 2024-08-24: 1000 mg via INTRAVENOUS
  Filled 2024-08-24: qty 200

## 2024-08-24 MED ORDER — BUPIVACAINE-EPINEPHRINE (PF) 0.25% -1:200000 IJ SOLN
INTRAMUSCULAR | Status: AC
  Filled 2024-08-24: qty 30

## 2024-08-24 MED ORDER — ALBUTEROL SULFATE HFA 108 (90 BASE) MCG/ACT IN AERS
INHALATION_SPRAY | RESPIRATORY_TRACT | Status: DC | PRN
  Administered 2024-08-24: 2 via RESPIRATORY_TRACT

## 2024-08-24 MED ORDER — SODIUM CHLORIDE 0.9 % IV SOLN: INTRAVENOUS | Status: DC

## 2024-08-24 MED ORDER — ALBUTEROL SULFATE (2.5 MG/3ML) 0.083% IN NEBU
2.5000 mg | INHALATION_SOLUTION | RESPIRATORY_TRACT | Status: DC | PRN
  Administered 2024-08-25: 2.5 mg via RESPIRATORY_TRACT
  Filled 2024-08-24: qty 3

## 2024-08-24 MED ORDER — BUPIVACAINE LIPOSOME 1.3 % IJ SUSP
INTRAMUSCULAR | Status: AC
  Filled 2024-08-24: qty 20

## 2024-08-24 MED ORDER — ONDANSETRON HCL 4 MG/2ML IJ SOLN: 4.0000 mg | Freq: Once | INTRAMUSCULAR | Status: DC | PRN

## 2024-08-24 MED ORDER — METHOCARBAMOL 500 MG PO TABS
500.0000 mg | ORAL_TABLET | Freq: Four times a day (QID) | ORAL | Status: DC | PRN
Start: 2024-08-24 — End: 2024-08-28
  Administered 2024-08-24 – 2024-08-28 (×11): 500 mg via ORAL
  Filled 2024-08-24 (×10): qty 1

## 2024-08-24 MED ORDER — AMISULPRIDE (ANTIEMETIC) 5 MG/2ML IV SOLN
INTRAVENOUS | Status: AC
Start: 2024-08-24 — End: 2024-08-24
  Filled 2024-08-24: qty 4

## 2024-08-24 MED ORDER — FLUTICASONE PROPIONATE 50 MCG/ACT NA SUSP
1.0000 | NASAL | Status: DC | PRN
Start: 2024-08-24 — End: 2024-08-28

## 2024-08-24 MED ORDER — PHENOL 1.4 % MT LIQD: 1.0000 | OROMUCOSAL | Status: DC | PRN

## 2024-08-24 MED ORDER — PROPOFOL 10 MG/ML IV BOLUS
INTRAVENOUS | Status: DC | PRN
  Administered 2024-08-24: 20 mg via INTRAVENOUS
  Administered 2024-08-24: 60 mg via INTRAVENOUS
  Administered 2024-08-24: 30 mg via INTRAVENOUS
  Administered 2024-08-24: 20 mg via INTRAVENOUS

## 2024-08-24 MED ORDER — EMPAGLIFLOZIN 10 MG PO TABS
10.0000 mg | ORAL_TABLET | Freq: Two times a day (BID) | ORAL | Status: DC
  Administered 2024-08-25 – 2024-08-28 (×7): 10 mg via ORAL
  Filled 2024-08-24 (×9): qty 1

## 2024-08-24 MED ORDER — TADALAFIL 10 MG PO TABS: 10.0000 mg | ORAL_TABLET | ORAL | Status: AC | PRN

## 2024-08-24 SURGICAL SUPPLY — 52 items
BAG COUNTER SPONGE SURGICOUNT (BAG) IMPLANT
BLADE SAG 18X100X1.27 (BLADE) ×1 IMPLANT
BLADE SAW SAG 35X64 .89 (BLADE) ×1 IMPLANT
BLADE SAW SGTL 11.0X1.19X90.0M (BLADE) IMPLANT
BNDG COHESIVE 3X5 TAN ST LF (GAUZE/BANDAGES/DRESSINGS) ×1 IMPLANT
BNDG ELASTIC 4X5.8 VLCR NS LF (GAUZE/BANDAGES/DRESSINGS) IMPLANT
BNDG ELASTIC 6X10 VLCR STRL LF (GAUZE/BANDAGES/DRESSINGS) ×1 IMPLANT
BOWL SMART MIX CTS (DISPOSABLE) IMPLANT
CEMENT BONE REFOBACIN R1X40 US (Cement) IMPLANT
CHLORAPREP W/TINT 26 (MISCELLANEOUS) ×2 IMPLANT
CLSR STERI-STRIP ANTIMIC 1/2X4 (GAUZE/BANDAGES/DRESSINGS) IMPLANT
COMPONENT FEM CMT PS STD 11 RT (Joint) IMPLANT
COMPONENT PATELLA 3 PEG 38 (Joint) IMPLANT
COMPONENT TIB KNEE PS G 0 RT (Joint) IMPLANT
COVER SURGICAL LIGHT HANDLE (MISCELLANEOUS) ×1 IMPLANT
CUFF TRNQT CYL 34X4.125X (TOURNIQUET CUFF) ×1 IMPLANT
DERMABOND ADVANCED .7 DNX12 (GAUZE/BANDAGES/DRESSINGS) ×2 IMPLANT
DRAPE INCISE IOBAN 85X60 (DRAPES) ×1 IMPLANT
DRAPE SHEET LG 3/4 BI-LAMINATE (DRAPES) ×1 IMPLANT
DRAPE U-SHAPE 47X51 STRL (DRAPES) ×1 IMPLANT
DRESSING AQUACEL AG SP 3.5X10 (GAUZE/BANDAGES/DRESSINGS) IMPLANT
DRSG AQUACEL AG ADV 3.5X10 (GAUZE/BANDAGES/DRESSINGS) ×1 IMPLANT
ELECT REM PT RETURN 15FT ADLT (MISCELLANEOUS) ×1 IMPLANT
GAUZE SPONGE 4X4 12PLY STRL (GAUZE/BANDAGES/DRESSINGS) ×1 IMPLANT
GLOVE BIO SURGEON STRL SZ 6.5 (GLOVE) ×2 IMPLANT
GLOVE BIOGEL PI IND STRL 6.5 (GLOVE) ×1 IMPLANT
GLOVE BIOGEL PI IND STRL 8 (GLOVE) ×1 IMPLANT
GLOVE SURG ORTHO 8.0 STRL STRW (GLOVE) ×2 IMPLANT
GOWN STRL REUS W/ TWL XL LVL3 (GOWN DISPOSABLE) ×2 IMPLANT
HOLDER FOLEY CATH W/STRAP (MISCELLANEOUS) ×1 IMPLANT
HOOD PEEL AWAY T7 (MISCELLANEOUS) ×3 IMPLANT
INSERT TIBIAL PERSONA RT 11 (Joint) IMPLANT
KIT TURNOVER KIT A (KITS) ×1 IMPLANT
MANIFOLD NEPTUNE II (INSTRUMENTS) ×1 IMPLANT
MARKER SKIN DUAL TIP RULER LAB (MISCELLANEOUS) ×1 IMPLANT
NS IRRIG 1000ML POUR BTL (IV SOLUTION) ×1 IMPLANT
PACK TOTAL KNEE CUSTOM (KITS) ×1 IMPLANT
PENCIL SMOKE EVACUATOR (MISCELLANEOUS) ×1 IMPLANT
PIN DRILL HDLS TROCAR 75 4PK (PIN) IMPLANT
SCREW HEADED 33MM KNEE (MISCELLANEOUS) IMPLANT
SET HNDPC FAN SPRY TIP SCT (DISPOSABLE) ×1 IMPLANT
SOLUTION IRRIG SURGIPHOR (IV SOLUTION) IMPLANT
SOLUTION PRONTOSAN WOUND 350ML (IRRIGATION / IRRIGATOR) IMPLANT
STRIP CLOSURE SKIN 1/2X4 (GAUZE/BANDAGES/DRESSINGS) ×1 IMPLANT
SUT MNCRL AB 3-0 PS2 18 (SUTURE) ×1 IMPLANT
SUT STRATAFIX 14 PDO 48 VLT (SUTURE) ×1 IMPLANT
SUT VIC AB 2-0 CT2 27 (SUTURE) ×2 IMPLANT
SUTURE STRATFX 0 PDS 27 VIOLET (SUTURE) ×1 IMPLANT
TRAY FOLEY MTR SLVR 14FR STAT (SET/KITS/TRAYS/PACK) IMPLANT
TUBE SUCTION HIGH CAP CLEAR NV (SUCTIONS) ×1 IMPLANT
UNDERPAD 30X36 HEAVY ABSORB (UNDERPADS AND DIAPERS) ×1 IMPLANT
WRAP KNEE MAXI GEL POST OP (GAUZE/BANDAGES/DRESSINGS) ×1 IMPLANT

## 2024-08-24 NOTE — Anesthesia Postprocedure Evaluation (Signed)
 Anesthesia Post Note  Patient: Richard [name]  Procedure(s) Performed: ARTHROPLASTY, KNEE, TOTAL (Right: Knee)     Patient location during evaluation: PACU Anesthesia Type: General Level of consciousness: awake and alert and oriented Pain management: pain level controlled Vital Signs Assessment: post-procedure vital signs reviewed and stable Respiratory status: nonlabored ventilation, respiratory function stable and spontaneous breathing Cardiovascular status: blood pressure returned to baseline and stable Postop Assessment: no apparent nausea or vomiting Anesthetic complications: no   No notable events documented.  Last Vitals:  Vitals:   08/24/24 1555 08/24/24 1600  BP:  (!) 169/74  Pulse:  76  Resp:  20  Temp:    SpO2: 98% 94%    Last Pain:  Vitals:   08/24/24 1606  TempSrc:   PainSc: 5                  Gennesis Hogland A.

## 2024-08-24 NOTE — Anesthesia Procedure Notes (Signed)
 Procedure Name: Intubation Date/Time: 08/24/2024 1:47 PM  Performed by: Cindie Charleen PARAS, CRNAPre-anesthesia Checklist: Patient identified, Emergency Drugs available, Suction available, Patient being monitored and Timeout performed Patient Re-evaluated:Patient Re-evaluated prior to induction Oxygen  Delivery Method: Circle system utilized Preoxygenation: Pre-oxygenation with 100% oxygen  Induction Type: IV induction Ventilation: Mask ventilation without difficulty Laryngoscope Size: Mac and 4 Grade View: Grade I Tube type: Oral Tube size: 7.5 mm Number of attempts: 1 Airway Equipment and Method: Stylet Placement Confirmation: ETT inserted through vocal cords under direct vision, positive ETCO2 and breath sounds checked- equal and bilateral Secured at: 24 cm Tube secured with: Tape Dental Injury: Teeth and Oropharynx as per pre-operative assessment

## 2024-08-24 NOTE — Interval H&P Note (Signed)
 The patient has been re-examined, and the chart reviewed, and there have been no interval changes to the documented history and physical.    Plan for R TKA for R knee OA  The operative side was examined and the patient was confirmed to have sensation to DPN, SPN, TN intact, Motor EHL, ext, flex 5/5, and DP 2+, PT 2+, No significant edema.   The risks, benefits, and alternatives have been discussed at length with patient, and the patient is willing to proceed.  Right knee marked. Consent has been signed.

## 2024-08-24 NOTE — Discharge Instructions (Signed)
 INSTRUCTIONS AFTER JOINT REPLACEMENT   Remove items at home which could result in a fall. This includes throw rugs or furniture in walking pathways ICE to the affected joint every three hours while awake for 30 minutes at a time, for at least the first 3-5 days, and then as needed for pain and swelling.  Continue to use ice for pain and swelling. You may notice swelling that will progress down to the foot and ankle.  This is normal after surgery.  Elevate your leg when you are not up walking on it.   Continue to use the breathing machine you got in the hospital (incentive spirometer) which will help keep your temperature down.  It is common for your temperature to cycle up and down following surgery, especially at night when you are not up moving around and exerting yourself.  The breathing machine keeps your lungs expanded and your temperature down.  DIET:  As you were doing prior to hospitalization, we recommend a well-balanced diet.  DRESSING / WOUND CARE / SHOWERING:  Keep the surgical dressing until follow up.  The dressing is water proof, so you can shower without any extra covering.  IF THE DRESSING FALLS OFF or the wound gets wet inside, change the dressing with sterile gauze.  Please use good hand washing techniques before changing the dressing.  Do not use any lotions or creams on the incision until instructed by your surgeon.    ACTIVITY  Increase activity slowly as tolerated, but follow the weight bearing instructions below.   No driving for 6 weeks or until further direction given by your physician.  You cannot drive while taking narcotics.  No lifting or carrying greater than 10 lbs. until further directed by your surgeon. Avoid periods of inactivity such as sitting longer than an hour when not asleep. This helps prevent blood clots.  You may return to work once you are authorized by your doctor.   WEIGHT BEARING: Weight bearing as tolerated with assist device (walker, cane, etc) as  directed, use it as long as suggested by your surgeon or therapist, typically at least 4-6 weeks.  EXERCISES  Results after joint replacement surgery are often greatly improved when you follow the exercise, range of motion and muscle strengthening exercises prescribed by your doctor. Safety measures are also important to protect the joint from further injury. Any time any of these exercises cause you to have increased pain or swelling, decrease what you are doing until you are comfortable again and then slowly increase them. If you have problems or questions, call your caregiver or physical therapist for advice.   Rehabilitation is important following a joint replacement. After just a few days of immobilization, the muscles of the leg can become weakened and shrink (atrophy).  These exercises are designed to build up the tone and strength of the thigh and leg muscles and to improve motion. Often times heat used for twenty to thirty minutes before working out will loosen up your tissues and help with improving the range of motion but do not use heat for the first two weeks following surgery (sometimes heat can increase post-operative swelling).   These exercises can be done on a training (exercise) mat, on the floor, on a table or on a bed. Use whatever works the best and is most comfortable for you.    Use music or television while you are exercising so that the exercises are a pleasant break in your day. This will make your life  better with the exercises acting as a break in your routine that you can look forward to.   Perform all exercises about fifteen times, three times per day or as directed.  You should exercise both the operative leg and the other leg as well.  Exercises include:   Quad Sets - Tighten up the muscle on the front of the thigh (Quad) and hold for 5-10 seconds.   Straight Leg Raises - With your knee straight (if you were given a brace, keep it on), lift the leg to 60 degrees, hold  for 3 seconds, and slowly lower the leg.  Perform this exercise against resistance later as your leg gets stronger.  Leg Slides: Lying on your back, slowly slide your foot toward your buttocks, bending your knee up off the floor (only go as far as is comfortable). Then slowly slide your foot back down until your leg is flat on the floor again.  Angel Wings: Lying on your back spread your legs to the side as far apart as you can without causing discomfort.  Hamstring Strength:  Lying on your back, push your heel against the floor with your leg straight by tightening up the muscles of your buttocks.  Repeat, but this time bend your knee to a comfortable angle, and push your heel against the floor.  You may put a pillow under the heel to make it more comfortable if necessary.   A rehabilitation program following joint replacement surgery can speed recovery and prevent re-injury in the future due to weakened muscles. Contact your doctor or a physical therapist for more information on knee rehabilitation.   CONSTIPATION:  Constipation is defined medically as fewer than three stools per week and severe constipation as less than one stool per week.  Even if you have a regular bowel pattern at home, your normal regimen is likely to be disrupted due to multiple reasons following surgery.  Combination of anesthesia, postoperative narcotics, change in appetite and fluid intake all can affect your bowels.   YOU MUST use at least one of the following options; they are listed in order of increasing strength to get the job done.  They are all available over the counter, and you may need to use some, POSSIBLY even all of these options:    Drink plenty of fluids (prune juice may be helpful) and high fiber foods Colace 100 mg by mouth twice a day  Senokot for constipation as directed and as needed Dulcolax (bisacodyl ), take with full glass of water  Miralax  (polyethylene glycol) once or twice a day as needed.  If you  have tried all these things and are unable to have a bowel movement in the first 3-4 days after surgery call either your surgeon or your primary doctor.    If you experience loose stools or diarrhea, hold the medications until you stool forms back up.  If your symptoms do not get better within 1 week or if they get worse, check with your doctor.  If you experience the worst abdominal pain ever or develop nausea or vomiting, please contact the office immediately for further recommendations for treatment.  ITCHING:  If you experience itching with your medications, try taking only a single pain pill, or even half a pain pill at a time.  You can also use Benadryl  over the counter for itching or also to help with sleep.   TED HOSE STOCKINGS:  Use stockings on both legs until for at least 2 weeks or  as directed by physician office. They may be removed at night for sleeping.  MEDICATIONS:  See your medication summary on the "After Visit Summary" that nursing will review with you.  You may have some home medications which will be placed on hold until you complete the course of blood thinner medication.  It is important for you to complete the blood thinner medication as prescribed.  Blood clot prevention (DVT Prophylaxis): After surgery you are at an increased risk for a blood clot.  You were prescribed a blood thinner, Aspirin  81mg , to be taken twice daily for a total of 4 weeks from surgery to help reduce your risk of getting a blood clot.  After the 4 weeks, you may stop the Aspirin .  Keep in mind, this is in addition to your Plavix , which you will restart on the second day after surgery (Wednesday 08/26/24), then continue taking this as prescribed.    Signs of a pulmonary embolus (blood clot in the lungs) include sudden short of breath, feeling lightheaded or dizzy, chest pain with a deep breath, rapid pulse rapid breathing.  Signs of a blood clot in your arms or legs include new unexplained swelling and  cramping, warm, red or darkened skin around the painful area.  Please call the office or 911 right away if these signs or symptoms develop.  PRECAUTIONS:   If you experience chest pain or shortness of breath - call 911 immediately for transfer to the hospital emergency department.   If you develop a fever greater that 101 F, purulent drainage from wound, increased redness or drainage from wound, foul odor from the wound/dressing, or calf pain - CONTACT YOUR SURGEON.                                                   FOLLOW-UP APPOINTMENTS:  If you do not already have a post-op appointment, please call the office for an appointment to be seen by your surgeon.  Guidelines for how soon to be seen are listed in your "After Visit Summary", but are typically between 2-3 weeks after surgery.  If you have a specialized bandage, you may be told to follow up 1 week after surgery.  OTHER INSTRUCTIONS:  Knee Replacement:  Do not place pillow under knee, focus on keeping the knee straight while resting.  Place foam block, curve side up under heel at all times except when walking.  DO NOT modify, tear, cut, or change the foam block in any way.  POST-OPERATIVE OPIOID TAPER INSTRUCTIONS: It is important to wean off of your opioid medication as soon as possible. If you do not need pain medication after your surgery it is ok to stop day one. Opioids include: Codeine, Hydrocodone (Norco, Vicodin), Oxycodone (Percocet, oxycontin ) and hydromorphone  amongst others.  Long term and even short term use of opiods can cause: Increased pain response Dependence Constipation Depression Respiratory depression And more.  Withdrawal symptoms can include Flu like symptoms Nausea, vomiting And more Techniques to manage these symptoms Hydrate well Eat regular healthy meals Stay active Use relaxation techniques(deep breathing, meditating, yoga) Do Not substitute Alcohol to help with tapering If you have been on opioids for  less than two weeks and do not have pain than it is ok to stop all together.  Plan to wean off of opioids This plan should start within one week  post op of your joint replacement. Maintain the same interval or time between taking each dose and first decrease the dose.  Cut the total daily intake of opioids by one tablet each day Next start to increase the time between doses. The last dose that should be eliminated is the evening dose.   MAKE SURE YOU:  Understand these instructions.  Get help right away if you are not doing well or get worse.    Thank you for letting us  be a part of your medical care team.  It is a privilege we respect greatly.  We hope these instructions will help you stay on track for a fast and full recovery!

## 2024-08-24 NOTE — Transfer of Care (Signed)
 Immediate Anesthesia Transfer of Care Note  Patient: Richard [name]  Procedure(s) Performed: ARTHROPLASTY, KNEE, TOTAL (Right: Knee)  Patient Location: PACU  Anesthesia Type:General  Level of Consciousness: sedated, patient cooperative, and responds to stimulation  Airway & Oxygen  Therapy: Patient Spontanous Breathing and Patient connected to face mask oxygen   Post-op Assessment: Report given to RN and Post -op Vital signs reviewed and stable  Post vital signs: Reviewed and stable  Last Vitals:  Vitals Value Taken Time  BP 174/87 08/24/24 15:28  Temp    Pulse 74 08/24/24 15:30  Resp 14 08/24/24 15:30  SpO2 100 % 08/24/24 15:30  Vitals shown include unfiled device data.  Last Pain:  Vitals:   08/24/24 1123  TempSrc:   PainSc: 10-Worst pain ever         Complications: No notable events documented.

## 2024-08-24 NOTE — Progress Notes (Signed)
 Orthopedic Tech Progress Note Patient Details:  Richard [name] Feb 23, 1961 [id number]  Ortho Devices Type of Ortho Device: Bone foam zero knee Ortho Device/Splint Location: RLE Ortho Device/Splint Interventions: Ordered   Post Interventions Instructions Provided: Adjustment of device, Care of device, Poper ambulation with device  [name] 08/24/2024, 3:35 PM

## 2024-08-24 NOTE — Op Note (Signed)
 DATE OF SURGERY:  08/24/2024 TIME: 4:11 PM  PATIENT NAME:  Richard [name]   AGE: 63 y.o.    PRE-OPERATIVE DIAGNOSIS: End-stage right knee osteoarthritis  POST-OPERATIVE DIAGNOSIS:  Same  PROCEDURE: Press-fit right total Knee Arthroplasty  SURGEON:  Jequan Shahin A Aadya Kindler, MD   ASSISTANT: Bernarda Mclean, PA-C, present and scrubbed throughout the case, critical for assistance with exposure, retraction, instrumentation, and closure.   OPERATIVE IMPLANTS:  [employer] size 11 press-fit right CR femur, size G right tibial baseplate, 38 mm 3-peg press-fit patella, 11 mm MC poly insert Implant Name Type Inv. Item Serial No. Manufacturer Lot No. LRB No. Used Action  CEMENT BONE REFOBACIN [id number] US  - [id number] Cement CEMENT BONE REFOBACIN R1X40 US   [employer](ORTH,TRAU,BIO,SG) [id number] Right 2 Implanted  COMPONENT PATELLA 3 PEG 38 - [id number] Joint COMPONENT PATELLA 3 PEG 38  [employer](ORTH,TRAU,BIO,SG) [id number] Right 1 Implanted  COMPONENT FEM CMT PS STD 11 RT - [id number] Joint COMPONENT FEM CMT PS STD 11 RT  [employer](ORTH,TRAU,BIO,SG) [id number] Right 1 Implanted  COMPONENT TIB KNEE PS G 0 RT - [id number] Joint COMPONENT TIB KNEE PS G 0 RT  [employer](ORTH,TRAU,BIO,SG) [id number] Right 1 Implanted  INSERT TIBIAL PERSONA RT 11 - [id number] Joint INSERT TIBIAL PERSONA RT 11  [employer](ORTH,TRAU,BIO,SG) [id number] Right 1 Implanted      PREOPERATIVE INDICATIONS:  [name] is a 63 y.o. year old male with end stage bone on bone degenerative arthritis of the knee who failed conservative treatment, including injections, antiinflammatories, activity modification, and assistive devices, and had significant impairment of their activities of daily living, and elected for Total Knee Arthroplasty.   The risks, benefits, and alternatives were discussed at length including but not limited to the risks of infection, bleeding, nerve injury, stiffness, blood clots, the need for revision  surgery, cardiopulmonary complications, among others, and they were willing to proceed.  ESTIMATED BLOOD LOSS: 50cc  OPERATIVE DESCRIPTION:   Once adequate anesthesia was induced, preoperative antibiotics,2 gm of ancef  and 1gm of vanc,1 gm of Tranexamic Acid, and 8 mg of Decadron  administered, the patient was positioned supine with a right thigh tourniquet placed.  The right lower extremity was prepped and draped in sterile fashion.  A time-  out was performed identifying the patient, planned procedure, and the appropriate extremity.     The leg was  exsanguinated, tourniquet elevated to 300 mmHg.  A midline incision was  made followed by median parapatellar arthrotomy. Anterior horn of the medial meniscus was released and resected. A medial release was performed, the infrapatellar fat pad was resected with care taken to protect the patellar tendon. The suprapatellar fat was removed to exposed the distal anterior femur. The anterior horn of the lateral meniscus and ACL were released.    Following initial  exposure, I first started with the femur  The femoral  canal was opened with a drill, canal was suctioned to try to prevent fat emboli.  An  intramedullary rod was passed set at 6 degrees valgus, 11 mm. The distal femur was resected.  Following this resection, the tibia was  subluxated anteriorly.  Using the extramedullary guide, 10 mm of bone was resected off   the proximal lateral tibia.  We confirmed the gap would be  stable medially and laterally with a size 10mm spacer block as well as confirmed that the tibial cut was perpendicular in the coronal plane, checking with an alignment rod.    Once this was done, the posterior femoral referencing femoral sizer  was placed under to the posterior condyles with 3 degrees of external rotational which was parallel to the transepicondylar axis and perpendicular to [employer]. The femur was sized to be a size 11 in the anterior-  posterior dimension. The   anterior, posterior, and  chamfer cuts were made without difficulty nor   notching making certain that I was along the anterior cortex to help  with flexion gap stability. Next a laminar spreader was placed with the knee in flexion and the medial lateral menisci were resected.  5 cc of the Exparel  mixture was injected in the medial side of the back of the knee and 3 cc in the lateral side.  1/2 inch curved osteotome was used to resect posterior osteophyte that was then removed with a pituitary rongeur.        At this point, the tibia was sized to be a size G.  The size G tray was  then pinned in position. Trial reduction was now carried with a 11 femur, G tibia, a 10 mm MC insert.  The knee was relatively tight laterally and loose medially, I upsized to an 11 mm insert and piecrust to the IT band which had better stability.  The knee had full extension and was stable to varus valgus stress in extension.  The knee was slightly tight in flexion and the PCL was partially released.    Attention was next directed to the patella.  Precut  measurement was noted to be 26 mm.  I resected down to 16 mm and used a  38mm patellar button to restore patellar height as well as cover the cut surface.     The patella lug holes were drilled and a 38 mm patella poly trial was placed.    The knee was brought to full extension with good flexion stability with the patella tracking through the trochlea without application of pressure.    Next the femoral component was again assessed and determined to be seated and appropriately lateralized.  The femoral lug holes were drilled.  The femoral component was then removed. Tibial component was again assessed and felt to be seated and appropriately rotated with the medial third of the tubercle. The tibia was then drilled, and keel punched.     Final components were  opened and impacted into place.   The knee was irrigated with sterile Betadine diluted in saline as well as pulse  lavage normal saline. The synovial lining was  then injected a dilute Exparel  with 30cc of 0.25% marcaine  with epinephrine .     I confirmed that I was satisfied with the range of motion and stability, and the final 11 mm MC poly insert was chosen.  It was placed into the knee.         The tourniquet had been let down at 63 minutes.  No significant hemostasis was required.  The medial parapatellar arthrotomy was then reapproximated using #1 Stratafix sutures with the knee  in flexion.  The remaining wound was closed with 0 stratafix, 2-0 Vicryl, and running 3-0 Monocryl. The knee was cleaned, dried, dressed sterilely using Dermabond and   Aquacel dressing.  The patient was then brought to recovery room in stable condition, tolerating the procedure  well. There were no complications.   Post op recs: WB: WBAT Abx: ancef +vanc Imaging: PACU xrays DVT prophylaxis: Aspirin  81mg  BID x4 weeks resume plavix  POD2 Follow up: 2 weeks after surgery for a wound check with Dr. Edna at [facility].  Address: [address], [place], [place] [address]  Office Phone: [phone]  Toribio Higashi, MD Orthopaedic Surgery

## 2024-08-25 ENCOUNTER — Encounter: Admitting: Gastroenterology

## 2024-08-25 ENCOUNTER — Encounter (HOSPITAL_COMMUNITY): Payer: Self-pay | Admitting: Orthopedic Surgery

## 2024-08-25 DIAGNOSIS — Z794 Long term (current) use of insulin: Secondary | ICD-10-CM | POA: Diagnosis not present

## 2024-08-25 DIAGNOSIS — E1165 Type 2 diabetes mellitus with hyperglycemia: Secondary | ICD-10-CM | POA: Diagnosis not present

## 2024-08-25 DIAGNOSIS — E119 Type 2 diabetes mellitus without complications: Secondary | ICD-10-CM

## 2024-08-25 LAB — BASIC METABOLIC PANEL WITH GFR
Anion gap: 10 (ref 5–15)
BUN: 18 mg/dL (ref 8–23)
CO2: 24 mmol/L (ref 22–32)
Calcium: 9.2 mg/dL (ref 8.9–10.3)
Chloride: 97 mmol/L — ABNORMAL LOW (ref 98–111)
Creatinine, Ser: 0.89 mg/dL (ref 0.61–1.24)
GFR, Estimated: >60 mL/min (ref >60)
Glucose, Bld: 320 mg/dL — ABNORMAL HIGH (ref 70–99)
Potassium: 5.1 mmol/L (ref 3.5–5.1)
Sodium: 131 mmol/L — ABNORMAL LOW (ref 135–145)

## 2024-08-25 LAB — CBC
HCT: 38.5 % — ABNORMAL LOW (ref 39.0–52.0)
Hemoglobin: 12 g/dL — ABNORMAL LOW (ref 13.0–17.0)
MCH: 26.5 pg (ref 26.0–34.0)
MCHC: 31.2 g/dL (ref 30.0–36.0)
MCV: 85 fL (ref 80.0–100.0)
Platelets: 225 K/uL (ref 150–400)
RBC: 4.53 MIL/uL (ref 4.22–5.81)
RDW: 14.2 % (ref 11.5–15.5)
WBC: 10.8 K/uL — ABNORMAL HIGH (ref 4.0–10.5)
nRBC: 0 % (ref 0.0–0.2)

## 2024-08-25 LAB — GLUCOSE, CAPILLARY
Glucose-Capillary: 337 mg/dL — ABNORMAL HIGH (ref 70–99)
Glucose-Capillary: 270 mg/dL — ABNORMAL HIGH (ref 70–99)
Glucose-Capillary: 214 mg/dL — ABNORMAL HIGH (ref 70–99)
Glucose-Capillary: 187 mg/dL — ABNORMAL HIGH (ref 70–99)

## 2024-08-25 MED ORDER — DULAGLUTIDE 4.5 MG/0.5ML ~~LOC~~ SOAJ: 4.5000 mg | SUBCUTANEOUS | Status: DC

## 2024-08-25 MED ORDER — INSULIN GLARGINE-YFGN 100 UNIT/ML ~~LOC~~ SOLN
20.0000 [IU] | Freq: Every day | SUBCUTANEOUS | Status: DC
  Administered 2024-08-26 – 2024-08-28 (×3): 20 [IU] via SUBCUTANEOUS
  Filled 2024-08-25 (×3): qty 0.2
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
## 2024-08-25 NOTE — Progress Notes (Signed)
     Subjective:  Patient reports pain as moderate.  He is doing okay overall.  He has not had work with physical therapy.  He did note some wheezing and has received neb treatments which have helped.  He does use an inhaler at home.  His sugars were fine yesterday but elevated to 300 this morning.  Yesterday's total administered Morphine  Milligram Equivalents: 155   Objective:   VITALS:   Vitals:   08/24/24 1656 08/24/24 1848 08/25/24 0205 08/25/24 0530  BP: (!) 156/85 (!) 146/81 132/71 124/64  Pulse: 73 85 78 74  Resp: 16 18 18 18   Temp: (!) 97.5 F (36.4 C) 98 F (36.7 C) 98.8 F (37.1 C) 97.8 F (36.6 C)  TempSrc: Oral Oral    SpO2: 100% 97% 98% 100%  Weight:      Height:        Sensation intact distally Intact pulses distally Dorsiflexion/Plantar flexion intact Incision: dressing C/D/I    Lab Results  Component Value Date   WBC 10.8 (H) 08/25/2024   HGB 12.0 (L) 08/25/2024   HCT 38.5 (L) 08/25/2024   MCV 85.0 08/25/2024   PLT 225 08/25/2024   BMET    Component Value Date/Time   NA 131 (L) 08/25/2024 0321   NA 136 08/03/2024 0927   K 5.1 08/25/2024 0321   CL 97 (L) 08/25/2024 0321   CO2 24 08/25/2024 0321   GLUCOSE 320 (H) 08/25/2024 0321   BUN 18 08/25/2024 0321   BUN 20 08/03/2024 0927   CREATININE 0.89 08/25/2024 0321   CREATININE 1.07 01/31/2021 0957   CALCIUM  9.2 08/25/2024 0321   EGFR 83 08/03/2024 0927   GFRNONAA >60 08/25/2024 0321   GFRNONAA 75 01/31/2021 0957    Xray: Total knee arthroplasty components in good position no adverse features  Assessment/Plan: 1 Day Post-Op   Principal Problem:   Primary osteoarthritis of right knee  Status post right knee arthroplasty done 08/24/2024  Hyperglycemia in the setting of type 2 diabetes, will monitor today on current regimen including sliding scale.  Consider resuming Trulicity  if remains elevated.  Post op recs: WB: WBAT Abx: ancef +vanc Imaging: PACU xrays DVT prophylaxis: Aspirin   81mg  BID x4 weeks resume plavix  POD2 Follow up: 2 weeks after surgery for a wound check with Dr. Edna at [facility].  Address: [address], [place], [place] [address]  Office Phone: [phone]   Richard [name] 08/25/2024, 6:30 AM   Richard Edna, MD  Contact information:   [phone] 7am-5pm epic message Dr. Edna, or call office for patient follow up: [phone] After hours and holidays please check [email or web address] for group call information for [facility]

## 2024-08-25 NOTE — Progress Notes (Signed)
 Patient's hyperglycemia still remarkable with utilization of home meds (Metformin , Jardiance , Actos , Semglee ) and SSI parameters.  Discussed with Dr. Edna.  Reached out to hospitalist team to assist with management.  After discussing patient case, hospitalist team plans to come evaluate the patient and adjust home meds/in-house meds as needed.  Hopeful for dispo tomorrow if hyperglycemia improves and updated home regimen.  Separate note to follow.   Bernarda Mclean, PA-C [facility] [phone]

## 2024-08-25 NOTE — TOC Transition Note (Signed)
 Transition of Care [facility]) - Discharge Note   Patient Details  Name: Richard [name] MRN: [id number] Date of Birth: 09/19/1961  Transition of Care [facility]) CM/SW Contact:  NORMAN ASPEN, LCSW Phone Number: 08/25/2024, 12:03 PM   Clinical Narrative:     Met with pt and confirming he has received RW from [facility].  OPPT already arranged.  No further IP CM needs.  Final next level of care: OP Rehab Barriers to Discharge: No Barriers Identified   Patient Goals and CMS Choice Patient states their goals for this hospitalization and ongoing recovery are:: return home          Discharge Placement                       Discharge Plan and Services Additional resources added to the After Visit Summary for                  DME Arranged: Walker rolling DME Agency: [facility]                  Social Drivers of Health (SDOH) Interventions SDOH Screenings   Food Insecurity: No Food Insecurity (08/24/2024)  Housing: Low Risk  (08/24/2024)  Transportation Needs: No Transportation Needs (08/24/2024)  Utilities: Not At Risk (08/24/2024)  Alcohol Screen: Low Risk  (09/25/2023)  Depression (PHQ2-9): Low Risk  (08/03/2024)  Financial Resource Strain: Low Risk  (09/25/2023)  Physical Activity: Sufficiently Active (09/25/2023)  Social Connections: Moderately Isolated (08/24/2024)  Stress: No Stress Concern Present (09/25/2023)  Tobacco Use: Medium Risk (08/24/2024)  Health Literacy: Adequate Health Literacy (09/25/2023)     Readmission Risk Interventions    04/01/2024   11:49 AM 09/06/2023   10:25 AM  Readmission Risk Prevention Plan  Post Dischage Appt  Complete  Medication Screening  Complete  Transportation Screening Complete Complete  Home Care Screening Complete   Medication Review (RN CM) Complete

## 2024-08-25 NOTE — Plan of Care (Signed)

## 2024-08-25 NOTE — Progress Notes (Signed)
 Physical Therapy Treatment Patient Details Name: Richard [name] MRN: [id number] DOB: [birth year] Today's Date: 08/25/2024   History of Present Illness 63 yo male s/p R TKA on 08/24/24. 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 Pain controlled with activity, ice to knee EOS. Will address stair training next session; pt is planning to go up stairs to 2nd level bedroom, has stairs to enter home as well. Anticipate pt will be ready for d/c tomorrow after 1-2 PT sessions.    If plan is discharge home, recommend the following: Assist for transportation;Help with stairs or ramp for entrance;Assistance with cooking/housework   Can travel by private vehicle        Equipment Recommendations  None recommended by PT    Recommendations for Other Services       Precautions / Restrictions Precautions Precautions: Fall;Knee Recall of Precautions/Restrictions: Intact Restrictions Weight Bearing Restrictions Per Provider Order: No Other Position/Activity Restrictions: WBAT     Mobility  Bed Mobility Overal bed mobility: Needs Assistance Bed Mobility: Supine to Sit, Sit to Supine     Supine to sit: Supervision Sit to supine: Supervision   General bed mobility comments: incr time, cues to self assist    Transfers Overall transfer level: Needs assistance Equipment used: Rolling walker (2 wheels) Transfers: Sit to/from Stand Sit to Stand: Supervision, Contact guard assist           General transfer comment: cues for hand placement and RLE postioning    Ambulation/Gait Ambulation/Gait assistance: Supervision, Contact guard assist Gait Distance (Feet): 100 Feet Assistive device: Rolling walker (2 wheels) Gait Pattern/deviations: Step-to pattern, Step-through pattern       General Gait Details: verbal cues for knee flexion during swing phase on R; step lenght and wt shift to RLE  improving   Stairs             Wheelchair Mobility     Tilt Bed    Modified Rankin (Stroke Patients Only)       Balance                                            Communication Communication Communication: No apparent difficulties  Cognition Arousal: Alert Behavior During Therapy: WFL for tasks assessed/performed   PT - Cognitive impairments: No apparent impairments                         Following commands: Intact      Cueing Cueing Techniques: Verbal cues  Exercises   Ankle pumps x 10, quad sets x 5, heel slides R x 10, SLR x 10 RLE   General Comments        Pertinent Vitals/Pain Pain Assessment Pain Assessment: Faces Pain Score: 6  Faces Pain Scale: Hurts little more Pain Location: right knee Pain Descriptors / Indicators: Sore, Aching Pain Intervention(s): Limited activity within patient's tolerance, Monitored during session, Premedicated before session, Repositioned, Ice applied    Home Living                          Prior Function            PT Goals (current goals can now be found in the care plan  section) Acute Rehab PT Goals PT Goal Formulation: With patient Time For Goal Achievement: 09/01/24 Potential to Achieve Goals: Good Progress towards PT goals: Progressing toward goals    Frequency    7X/week      PT Plan      Co-evaluation              AM-PAC PT 6 Clicks Mobility   Outcome Measure  Help needed turning from your back to your side while in a flat bed without using bedrails?: A Little Help needed moving from lying on your back to sitting on the side of a flat bed without using bedrails?: None Help needed moving to and from a bed to a chair (including a wheelchair)?: A Little Help needed standing up from a chair using your arms (e.g., wheelchair or bedside chair)?: A Little Help needed to walk in hospital room?: A Little Help needed climbing 3-5 steps with a railing? :  None 6 Click Score: 20    End of Session Equipment Utilized During Treatment: Gait belt Activity Tolerance: Patient tolerated treatment well Patient left: in bed;with call bell/phone within reach;with bed alarm set Nurse Communication: Mobility status PT Visit Diagnosis: Other abnormalities of gait and mobility (R26.89)     Time: 8477-8455 PT Time Calculation (min) (ACUTE ONLY): 22 min  Charges:    $Gait Training: 8-22 mins PT General Charges $$ ACUTE PT VISIT: 1 Visit                     Rexene, PT  Acute Rehab Dept [facility]) [phone]  08/25/2024    [facility] 08/25/2024, 4:04 PM

## 2024-08-25 NOTE — Care Management Obs Status (Signed)
 MEDICARE OBSERVATION STATUS NOTIFICATION   Patient Details  Name: Richard [name] MRN: [id number] Date of Birth: 06/06/1961   Medicare Observation Status Notification Given:  Chaney NORMAN ASPEN, LCSW 08/25/2024, 2:08 PM

## 2024-08-25 NOTE — Evaluation (Signed)
 Physical Therapy Evaluation Patient Details Name: Richard [name] MRN: [id number] DOB: 1961-09-27 Today's Date: 08/25/2024  History of Present Illness  63 yo male s/p R TKA on 08/24/24. 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  Pt asking about BSC, tubseat, bathroom DME and stairs at home. Pt agreeable to PT, reports he did not dangle last night.  Amb ~ 7' with RW, CGA, distance limited by dizziness. Dizziness immediately resolved in sitting, BP has been soft this am. Pt reports he is staying another day.   Pt will benefit from acute skilled PT to increase their independence and safety with mobility to allow discharge.          If plan is discharge home, recommend the following: Assist for transportation;Help with stairs or ramp for entrance;Assistance with cooking/housework   Can travel by private vehicle        Equipment Recommendations None recommended by PT  Recommendations for Other Services       Functional Status Assessment Patient has had a recent decline in their functional status and demonstrates the ability to make significant improvements in function in a reasonable and predictable amount of time.     Precautions / Restrictions Precautions Precautions: Fall;Knee Restrictions Weight Bearing Restrictions Per Provider Order: No Other Position/Activity Restrictions: WBAT      Mobility  Bed Mobility Overal bed mobility: Needs Assistance Bed Mobility: Supine to Sit     Supine to sit: Contact guard, Min assist     General bed mobility comments: pt able to self assist with incr time, eventual assist to lower RLE to floor    Transfers Overall transfer level: Needs assistance Equipment used: Rolling walker (2 wheels) Transfers: Sit to/from Stand Sit to Stand: Contact guard assist, From elevated surface           General transfer comment: cues for  hand placement and RLE postioning    Ambulation/Gait   Gait Distance (Feet): 7 Feet Assistive device: Rolling walker (2 wheels) Gait Pattern/deviations: Step-to pattern       General Gait Details: verbal cues for sequence, RW position. good stability. distance ltd by dizziness  Stairs            Wheelchair Mobility     Tilt Bed    Modified Rankin (Stroke Patients Only)       Balance                                             Pertinent Vitals/Pain Pain Assessment Pain Assessment: 0-10 Pain Score: 6  Pain Location: right knee Pain Descriptors / Indicators: Sore, Aching Pain Intervention(s): Limited activity within patient's tolerance, Monitored during session, Premedicated before session    Home Living Family/patient expects to be discharged to:: Private residence Living Arrangements: Spouse/significant other Available Help at Discharge: Family;Available PRN/intermittently Type of Home: House Home Access: Stairs to enter   [employer] of Steps: 1 at back - no rails--- 3 at front-- 2 rails Alternate Level Stairs-Number of Steps: flight Home Layout: Two level;1/2 bath on main level;Bed/bath upstairs Home Equipment: Cane - single point;Wheelchair [employer] (4 wheels);Rolling Walker (2 wheels) Additional Comments: pt may stay downstairs initially. may rent a bed    Prior Function Prior Level of Function : Needs assist  Physical Assist : ADLs (physical)     Mobility Comments: amb with cane or rollator ADLs Comments: s/o assist     Extremity/Trunk Assessment   Upper Extremity Assessment Upper Extremity Assessment: Overall WFL for tasks assessed    Lower Extremity Assessment Lower Extremity Assessment: RLE deficits/detail RLE Deficits / Details: ankle WFL, quads 2+/5, hip flexors 2+/5-limited by anticipated  post op pain and weakness       Communication   Communication Communication: No apparent  difficulties    Cognition Arousal: Alert Behavior During Therapy: WFL for tasks assessed/performed   PT - Cognitive impairments: No apparent impairments                         Following commands: Intact       Cueing Cueing Techniques: Verbal cues     General Comments      Exercises     Assessment/Plan    PT Assessment Patient needs continued PT services  PT Problem List Decreased strength;Decreased range of motion;Decreased activity tolerance;Decreased knowledge of use of DME;Pain;Decreased mobility       PT Treatment Interventions DME instruction;Gait training;Functional mobility training;Therapeutic activities;Patient/family education;Therapeutic exercise;Stair training    PT Goals (Current goals can be found in the Care Plan section)  Acute Rehab PT Goals PT Goal Formulation: With patient Time For Goal Achievement: 09/01/24 Potential to Achieve Goals: Good    Frequency 7X/week     Co-evaluation               AM-PAC PT 6 Clicks Mobility  Outcome Measure Help needed turning from your back to your side while in a flat bed without using bedrails?: A Little Help needed moving from lying on your back to sitting on the side of a flat bed without using bedrails?: A Little Help needed moving to and from a bed to a chair (including a wheelchair)?: A Little Help needed standing up from a chair using your arms (e.g., wheelchair or bedside chair)?: A Little Help needed to walk in hospital room?: A Little Help needed climbing 3-5 steps with a railing? : A Little 6 Click Score: 18    End of Session Equipment Utilized During Treatment: Gait belt Activity Tolerance: Patient tolerated treatment well Patient left: with call bell/phone within reach;in chair;with chair alarm set Nurse Communication: Mobility status PT Visit Diagnosis: Other abnormalities of gait and mobility (R26.89)    Time: 1100-1125 PT Time Calculation (min) (ACUTE ONLY): 25  min   Charges:   PT Evaluation $PT Eval Low Complexity: 1 Low PT Treatments $Gait Training: 8-22 mins PT General Charges $$ ACUTE PT VISIT: 1 Visit         Florine Sprenkle, PT  Acute Rehab Dept [facility]) [phone]  08/25/2024   [facility] 08/25/2024, 11:37 AM

## 2024-08-26 DIAGNOSIS — E785 Hyperlipidemia, unspecified: Secondary | ICD-10-CM | POA: Diagnosis present

## 2024-08-26 DIAGNOSIS — I10 Essential (primary) hypertension: Secondary | ICD-10-CM | POA: Diagnosis present

## 2024-08-26 DIAGNOSIS — Z7984 Long term (current) use of oral hypoglycemic drugs: Secondary | ICD-10-CM | POA: Diagnosis not present

## 2024-08-26 DIAGNOSIS — Z79899 Other long term (current) drug therapy: Secondary | ICD-10-CM | POA: Diagnosis not present

## 2024-08-26 DIAGNOSIS — F3342 Major depressive disorder, recurrent, in full remission: Secondary | ICD-10-CM | POA: Diagnosis present

## 2024-08-26 DIAGNOSIS — Z8249 Family history of ischemic heart disease and other diseases of the circulatory system: Secondary | ICD-10-CM | POA: Diagnosis not present

## 2024-08-26 DIAGNOSIS — E1151 Type 2 diabetes mellitus with diabetic peripheral angiopathy without gangrene: Secondary | ICD-10-CM | POA: Diagnosis present

## 2024-08-26 DIAGNOSIS — H409 Unspecified glaucoma: Secondary | ICD-10-CM | POA: Diagnosis present

## 2024-08-26 DIAGNOSIS — I700 Atherosclerosis of aorta: Secondary | ICD-10-CM | POA: Diagnosis present

## 2024-08-26 DIAGNOSIS — F419 Anxiety disorder, unspecified: Secondary | ICD-10-CM | POA: Diagnosis present

## 2024-08-26 DIAGNOSIS — K08109 Complete loss of teeth, unspecified cause, unspecified class: Secondary | ICD-10-CM | POA: Diagnosis present

## 2024-08-26 DIAGNOSIS — Z7902 Long term (current) use of antithrombotics/antiplatelets: Secondary | ICD-10-CM | POA: Diagnosis not present

## 2024-08-26 DIAGNOSIS — Z89432 Acquired absence of left foot: Secondary | ICD-10-CM | POA: Diagnosis not present

## 2024-08-26 DIAGNOSIS — J4489 Other specified chronic obstructive pulmonary disease: Secondary | ICD-10-CM | POA: Diagnosis present

## 2024-08-26 DIAGNOSIS — I080 Rheumatic disorders of both mitral and aortic valves: Secondary | ICD-10-CM | POA: Diagnosis present

## 2024-08-26 DIAGNOSIS — D509 Iron deficiency anemia, unspecified: Secondary | ICD-10-CM | POA: Diagnosis present

## 2024-08-26 DIAGNOSIS — Z7982 Long term (current) use of aspirin: Secondary | ICD-10-CM | POA: Diagnosis not present

## 2024-08-26 DIAGNOSIS — M1711 Unilateral primary osteoarthritis, right knee: Secondary | ICD-10-CM

## 2024-08-26 DIAGNOSIS — Z794 Long term (current) use of insulin: Secondary | ICD-10-CM | POA: Diagnosis not present

## 2024-08-26 DIAGNOSIS — I2510 Atherosclerotic heart disease of native coronary artery without angina pectoris: Secondary | ICD-10-CM | POA: Diagnosis present

## 2024-08-26 DIAGNOSIS — E1165 Type 2 diabetes mellitus with hyperglycemia: Secondary | ICD-10-CM | POA: Diagnosis present

## 2024-08-26 DIAGNOSIS — E1140 Type 2 diabetes mellitus with diabetic neuropathy, unspecified: Secondary | ICD-10-CM | POA: Diagnosis present

## 2024-08-26 DIAGNOSIS — R011 Cardiac murmur, unspecified: Secondary | ICD-10-CM | POA: Diagnosis present

## 2024-08-26 DIAGNOSIS — M25761 Osteophyte, right knee: Secondary | ICD-10-CM | POA: Diagnosis present

## 2024-08-26 LAB — BASIC METABOLIC PANEL WITH GFR
Anion gap: 10 (ref 5–15)
BUN: 28 mg/dL — ABNORMAL HIGH (ref 8–23)
CO2: 26 mmol/L (ref 22–32)
Calcium: 9 mg/dL (ref 8.9–10.3)
Chloride: 98 mmol/L (ref 98–111)
Creatinine, Ser: 1.08 mg/dL (ref 0.61–1.24)
GFR, Estimated: >60 mL/min (ref >60)
Glucose, Bld: 215 mg/dL — ABNORMAL HIGH (ref 70–99)
Potassium: 5 mmol/L (ref 3.5–5.1)
Sodium: 134 mmol/L — ABNORMAL LOW (ref 135–145)

## 2024-08-26 LAB — CBC
HCT: 34.8 % — ABNORMAL LOW (ref 39.0–52.0)
Hemoglobin: 10.9 g/dL — ABNORMAL LOW (ref 13.0–17.0)
MCH: 26.8 pg (ref 26.0–34.0)
MCHC: 31.3 g/dL (ref 30.0–36.0)
MCV: 85.7 fL (ref 80.0–100.0)
Platelets: 202 K/uL (ref 150–400)
RBC: 4.06 MIL/uL — ABNORMAL LOW (ref 4.22–5.81)
RDW: 14.4 % (ref 11.5–15.5)
WBC: 7 K/uL (ref 4.0–10.5)
nRBC: 0 % (ref 0.0–0.2)

## 2024-08-26 LAB — GLUCOSE, CAPILLARY
Glucose-Capillary: 177 mg/dL — ABNORMAL HIGH (ref 70–99)
Glucose-Capillary: 162 mg/dL — ABNORMAL HIGH (ref 70–99)
Glucose-Capillary: 172 mg/dL — ABNORMAL HIGH (ref 70–99)
Glucose-Capillary: 84 mg/dL (ref 70–99)

## 2024-08-26 MED ORDER — ARFORMOTEROL TARTRATE 15 MCG/2ML IN NEBU
15.0000 ug | INHALATION_SOLUTION | Freq: Two times a day (BID) | RESPIRATORY_TRACT | Status: DC
  Administered 2024-08-26 – 2024-08-28 (×3): 15 ug via RESPIRATORY_TRACT
  Filled 2024-08-26 (×4): qty 2

## 2024-08-26 MED ORDER — SODIUM CHLORIDE 0.9 % IV BOLUS
1000.0000 mL | Freq: Once | INTRAVENOUS | Status: AC
  Administered 2024-08-26: 1000 mL via INTRAVENOUS

## 2024-08-26 MED ORDER — REVEFENACIN 175 MCG/3ML IN SOLN
175.0000 ug | Freq: Every day | RESPIRATORY_TRACT | Status: DC
  Administered 2024-08-27 – 2024-08-28 (×2): 175 ug via RESPIRATORY_TRACT
  Filled 2024-08-26 (×2): qty 3

## 2024-08-26 NOTE — Progress Notes (Signed)
 Physical Therapy Treatment Patient Details Name: Richard [name] MRN: [id number] DOB: July 10, 1961 Today's Date: 08/26/2024   History of Present Illness 63 yo male s/p R TKA on 08/24/24. 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 Reviewed importance of terminal knee extension, proper RLE positioning--placed blanket roll to avoid R hip external rotation and knee flexion at rest. Address stair training next day in preparation for d/c Thurs/Friday.     If plan is discharge home, recommend the following: Assist for transportation;Help with stairs or ramp for entrance;Assistance with cooking/housework   Can travel by private vehicle        Equipment Recommendations  None recommended by PT    Recommendations for Other Services       Precautions / Restrictions Precautions Precautions: Fall;Knee Recall of Precautions/Restrictions: Intact Restrictions Weight Bearing Restrictions Per Provider Order: No Other Position/Activity Restrictions: WBAT     Mobility  Bed Mobility Overal bed mobility: Needs Assistance Bed Mobility: Sit to Supine, Supine to Sit     Supine to sit: Supervision Sit to supine: Supervision   General bed mobility comments: incr time, cues to self assist, eventual min assist d/t pain    Transfers Overall transfer level: Needs assistance Equipment used: Rolling walker (2 wheels) Transfers: Sit to/from Stand Sit to Stand: Min assist, Contact guard assist           General transfer comment: cues for hand placement and RLE postioning    Ambulation/Gait Ambulation/Gait assistance: Contact guard assist Gait Distance (Feet): 90 Feet (10') Assistive device: Rolling walker (2 wheels) Gait Pattern/deviations: Step-to pattern, Decreased stance time - right Gait velocity: decr     General Gait Details: ongoing education for RW positioning, step length and sequence, use of UEs as needed to  offload RLE for pain control   Stairs             Wheelchair Mobility     Tilt Bed    Modified Rankin (Stroke Patients Only)       Balance                                            Communication Communication Communication: No apparent difficulties  Cognition Arousal: Alert Behavior During Therapy: WFL for tasks assessed/performed   PT - Cognitive impairments: No apparent impairments                         Following commands: Intact      Cueing Cueing Techniques: Verbal cues  Exercises Total Joint Exercises Ankle Circles/Pumps: AROM, Both, 10 reps Quad Sets: AROM, Both, 10 reps Heel Slides: AAROM, Right, 10 reps Straight Leg Raises: AROM, Right, 10 reps, AAROM    General Comments        Pertinent Vitals/Pain Pain Assessment Pain Assessment: 0-10 Pain Score: 6  Pain Location: right knee Pain Descriptors / Indicators: Sore, Aching, Tightness, Grimacing, Guarding Pain Intervention(s): Limited activity within patient's tolerance, Monitored during session, Premedicated before session, RN gave pain meds during session, Patient requesting pain meds-RN notified, Repositioned, Ice applied    Home Living                          Prior Function  PT Goals (current goals can now be found in the care plan section) Acute Rehab PT Goals PT Goal Formulation: With patient Time For Goal Achievement: 09/01/24 Potential to Achieve Goals: Good Progress towards PT goals: Progressing toward goals    Frequency    7X/week      PT Plan      Co-evaluation              AM-PAC PT 6 Clicks Mobility   Outcome Measure  Help needed turning from your back to your side while in a flat bed without using bedrails?: A Little Help needed moving from lying on your back to sitting on the side of a flat bed without using bedrails?: A Little Help needed moving to and from a bed to a chair (including a wheelchair)?:  A Little Help needed standing up from a chair using your arms (e.g., wheelchair or bedside chair)?: A Little Help needed to walk in hospital room?: A Little Help needed climbing 3-5 steps with a railing? : None 6 Click Score: 19    End of Session Equipment Utilized During Treatment: Gait belt Activity Tolerance: Patient tolerated treatment well Patient left: with call bell/phone within reach;with bed alarm set;in bed;with nursing/sitter in room Nurse Communication: Mobility status;Patient requests pain meds PT Visit Diagnosis: Other abnormalities of gait and mobility (R26.89)     Time: 1422-1500 PT Time Calculation (min) (ACUTE ONLY): 38 min  Charges:    $Gait Training: 8-22 mins $Therapeutic Exercise: 8-22 mins $Therapeutic Activity: 8-22 mins PT General Charges $$ ACUTE PT VISIT: 1 Visit                     Camia Dipinto, PT  Acute Rehab Dept [facility]) [phone]  08/26/2024    [facility] 08/26/2024, 3:05 PM

## 2024-08-26 NOTE — Progress Notes (Signed)
 Physical Therapy Treatment Patient Details Name: Richard [name] MRN: [id number] DOB: 11-01-1960 Today's Date: 08/26/2024   History of Present Illness 63 yo male s/p R TKA on 08/24/24. Medical consult ordered d/t elevated/uncontrolled blood glucose post op. 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 Amb 95' with RW, CGA--needing 2 brief standing rests d/t pain and fatigue. Continue PT in acute setting.    If plan is discharge home, recommend the following: Assist for transportation;Help with stairs or ramp for entrance;Assistance with cooking/housework   Can travel by private vehicle        Equipment Recommendations  None recommended by PT    Recommendations for Other Services       Precautions / Restrictions Precautions Precautions: Fall;Knee Recall of Precautions/Restrictions: Intact Restrictions Weight Bearing Restrictions Per Provider Order: No Other Position/Activity Restrictions: WBAT     Mobility  Bed Mobility Overal bed mobility: Needs Assistance Bed Mobility: Sit to Supine       Sit to supine: Min assist   General bed mobility comments: incr time, cues to self assist, eventual min assist d/t pain    Transfers Overall transfer level: Needs assistance Equipment used: Rolling walker (2 wheels) Transfers: Sit to/from Stand Sit to Stand: Min assist           General transfer comment: 2 attempts to stand d/t pain. cues for hand placement and RLE postioning    Ambulation/Gait Ambulation/Gait assistance: Supervision, Contact guard assist Gait Distance (Feet): 95 Feet Assistive device: Rolling walker (2 wheels) Gait Pattern/deviations: Step-to pattern, Step-through pattern Gait velocity: decr     General Gait Details: verbal cues for knee flexion during swing phase on R; step length. wt shift to RLE improving end of distance; 2 brief  standing rests d/t UE fatigue   Stairs             Wheelchair Mobility     Tilt Bed    Modified Rankin (Stroke Patients Only)       Balance                                            Communication Communication Communication: No apparent difficulties  Cognition Arousal: Alert Behavior During Therapy: WFL for tasks assessed/performed   PT - Cognitive impairments: No apparent impairments                         Following commands: Intact      Cueing Cueing Techniques: Verbal cues  Exercises      General Comments        Pertinent Vitals/Pain Pain Assessment Pain Assessment: 0-10 Pain Score: 10-Worst pain ever Pain Location: right knee Pain Descriptors / Indicators: Sore, Aching, Tightness, Grimacing, Guarding Pain Intervention(s): Limited activity within patient's tolerance, Monitored during session, Premedicated before session, RN gave pain meds during session, Patient requesting pain meds-RN notified, Repositioned, Ice applied    Home Living                          Prior Function            PT Goals (current goals can now be found in the care plan section) Acute Rehab PT Goals PT Goal  Formulation: With patient Time For Goal Achievement: 09/01/24 Potential to Achieve Goals: Good Progress towards PT goals: Progressing toward goals    Frequency    7X/week      PT Plan      Co-evaluation              AM-PAC PT 6 Clicks Mobility   Outcome Measure  Help needed turning from your back to your side while in a flat bed without using bedrails?: A Little Help needed moving from lying on your back to sitting on the side of a flat bed without using bedrails?: A Little Help needed moving to and from a bed to a chair (including a wheelchair)?: A Little Help needed standing up from a chair using your arms (e.g., wheelchair or bedside chair)?: A Little Help needed to walk in hospital room?: A  Little Help needed climbing 3-5 steps with a railing? : None 6 Click Score: 19    End of Session Equipment Utilized During Treatment: Gait belt Activity Tolerance: Patient tolerated treatment well;Patient limited by pain Patient left: with call bell/phone within reach;with bed alarm set;in bed;with nursing/sitter in room Nurse Communication: Mobility status;Patient requests pain meds PT Visit Diagnosis: Other abnormalities of gait and mobility (R26.89)     Time: 8860-8791 PT Time Calculation (min) (ACUTE ONLY): 29 min  Charges:    $Gait Training: 8-22 mins $Therapeutic Activity: 8-22 mins PT General Charges $$ ACUTE PT VISIT: 1 Visit                     Jeydi Klingel, PT  Acute Rehab Dept [facility]) [phone]  08/26/2024    [facility] 08/26/2024, 12:20 PM

## 2024-08-26 NOTE — Hospital Course (Addendum)
 Richard [name] is a 63 y.o. male with PMH of  type 2 diabetes mellitus, COPD, moderate aortic stenosis and mitral stenosis, hypertension, hyperlipidemia, history of peripheral arterial disease and amputation of left toes, iron  deficiency anemia and severe osteoarthritis of the right knee who was admitted and had a successful right knee replacement surgery.  TRH consulted for uncontrolled hyperglycemia.  Subjective: Seen and examined today Pain is controlled, resting comfortably Eager to go home today  Assessment and plan:  T2dm w/ uncontrolled hyperglycemia: Blood sugar remains well-controlled continue current regimen-metformin , Actos , Semglee  20 units daily, SSI 0-15 units, Jardiance  and weekly dulaglutide .  Resume home meds upon discharge along with diabetic diet Recent Labs  Lab 08/27/24 1116 08/27/24 1436 08/27/24 1634 08/27/24 2139 08/28/24 0719  GLUCAP 219* 184* 116* 188* 89    Hyperlipidemia: Continue statin  COPD: Not in exacerbation cont nebs  Moderate aortic stenosis Mitral stenosis: Follow-up outpatient  Hypertension: Well-controlled, continue meds prn.  PAD Amputation of left toes. Continue Plavix , aspirin  and  statin  IDA: Hemoglobin stable, monitor  Severe osteoarthritis of the right knee S/P replacement Continue pain management DVT prophylaxis and disposition as per primary orthopedic team currently on aspirin  81 twice daily.  Of note patient on home Plavix  Pain is controlled, he is eager to go home today after PT clearance  Anxiety disorder: Continue Klonopin , Celexa   Mobility: PT Orders: Active PT Follow up Rec: Follow Physician's Recommendations For Discharge Plan And Follow Up Therapies11/20/2025 1400   DVT prophylaxis: SCDs Start: 08/24/24 1654 Place TED hose Start: 08/24/24 1654 Code Status:   Code Status: Full Code Family Communication: plan of care discussed with patient at bedside. Patient status is: Remains hospitalized because of  severity of illness Level of care: Med-Surg   Dispo: The patient is from: home            Anticipated disposition: Noted plan for discharge home today once cleared by PT   Objective: Vitals last 24 hrs: Vitals:   08/27/24 2141 08/28/24 0614 08/28/24 0925 08/28/24 0926  BP: 137/64 121/70    Pulse: (!) 105 98    Resp: 18 18    Temp: 98.2 F (36.8 C) 98.5 F (36.9 C)    TempSrc: Oral Oral    SpO2: 96% 93% 95% 95%  Weight:      Height:        Physical Examination: General exam: Alert and awake HEENT:Oral mucosa moist, Ear/Nose WNL grossly Respiratory system: Bilaterally clear BS,no use of accessory muscle Cardiovascular system: S1 & S2 +, No JVD. Gastrointestinal system: Abdomen soft,NT,ND, BS+ Nervous System: Alert, awake, moving all extremities,and following commands. Extremities: extremities warm, leg edema neg, RT Knee  w/ brace/dressing+ Skin: Warm, no rashes MSK: Normal muscle bulk,tone, power   Medications reviewed:  Scheduled Meds:  arformoterol   15 mcg Nebulization BID   aspirin   81 mg Oral BID   atorvastatin   40 mg Oral Daily   clonazePAM   0.5 mg Oral QHS   clopidogrel   75 mg Oral Daily   docusate sodium   100 mg Oral BID   Dulaglutide   4.5 mg Injection Weekly   empagliflozin   10 mg Oral BID WC   And   metFORMIN   1,000 mg Oral BID WC   escitalopram   5 mg Oral Daily   insulin  aspart  0-15 Units Subcutaneous TID WC   insulin  glargine-yfgn  20 Units Subcutaneous Daily   loratadine   10 mg Oral Daily   montelukast   10 mg Oral QHS  pantoprazole   40 mg Oral Daily   pioglitazone   30 mg Oral Daily   pregabalin   100 mg Oral TID   revefenacin   175 mcg Nebulization Daily   timolol   1 drop Both Eyes BID   Continuous Infusions:  lactated ringers  75 mL/hr at 08/28/24 9668   Diet: Diet Order             Diet - low sodium heart healthy           Diet - low sodium heart healthy           Diet Carb Modified Fluid consistency: Thin; Room service appropriate? Yes   Diet effective now

## 2024-08-26 NOTE — Progress Notes (Signed)
 PROGRESS NOTE Richard [name]  FMW:[id number] DOB: 1961/09/17 DOA: 08/24/2024 PCP: Rosan Dayton [name], DO  Brief Narrative/Hospital Course: Richard [name] is a 63 y.o. male with PMH of  type 2 diabetes mellitus, COPD, moderate aortic stenosis and mitral stenosis, hypertension, hyperlipidemia, history of peripheral arterial disease and amputation of left toes, iron  deficiency anemia and severe osteoarthritis of the right knee who was admitted and had a successful right knee replacement surgery.  TRH consulted for uncontrolled hyperglycemia.  Subjective: Seen and examined today Resting on the bedside chair slightly dozing off able to wake up and interact Overnight  on RA afebrile, VSS, Labs CBG 214, STABLE CBC BMP    Assessment and plan:  T2dm w/ uncontrolled hyperglycemia: Meds has been adjusted, blood sugar overall fairly controlled continue metformin , Actos , Semglee  20 units daily, SSI 0-15 units, Jardiance  and weekly dulaglutide .  Will continue to monitor and adjus. Recent Labs  Lab 08/25/24 0816 08/25/24 1129 08/25/24 1623 08/25/24 2118 08/26/24 0753  GLUCAP 337* 270* 214* 187* 177*    Hyperlipidemia: Continue statin  COPD: Not in exacerbation continue patient's bronchodilators/nebs inhaler  Moderate aortic stenosis Mitral stenosis: Follow-up outpatient  Hypertension: Well-controlled, continue meds prn.  PAD Amputation of left toes. Continue Plavix , on aspirin  81 statin  IDA: Hemoglobin stable, monitor  Severe osteoarthritis of the right knee S/P replacement Continue pain management DVT prophylaxis and disposition as per primary orthopedic team currently on aspirin  81 twice daily.  Of note patient home Plavix  2  Anxiety disorder: Continue Klonopin , Celexa   Mobility: PT Orders: Active PT Follow up Rec: Follow Physician's Recommendations For Discharge Plan And Follow Up Therapies11/18/2025 1600   DVT prophylaxis: SCDs Start: 08/24/24 1654 Place TED hose Start:  08/24/24 1654 Code Status:   Code Status: Full Code Family Communication: plan of care discussed with patient at bedside. Patient status is: Remains hospitalized because of severity of illness Level of care: Med-Surg   Dispo: The patient is from: home            Anticipated disposition: Possible discharge Thursday, should be medically stable as long as blood sugar remains < mid 200s Objective: Vitals last 24 hrs: Vitals:   08/25/24 1710 08/25/24 2104 08/25/24 2118 08/26/24 0549  BP: 114/62  119/70 128/72  Pulse:   82 88  Resp:   16 18  Temp:   98.2 F (36.8 C) 98.2 F (36.8 C)  TempSrc:   Oral Oral  SpO2:  98% 98% 98%  Weight:      Height:        Physical Examination: General exam: alert awake, oriented, older than stated age HEENT:Oral mucosa moist, Ear/Nose WNL grossly Respiratory system: Bilaterally clear BS,no use of accessory muscle Cardiovascular system: S1 & S2 +, No JVD. Gastrointestinal system: Abdomen soft,NT,ND, BS+ Nervous System: Alert, awake, moving all extremities,and following commands. Extremities: extremities warm, leg edema neg, right knee with dressing in place brace in place Skin: Warm, no rashes MSK: Normal muscle bulk,tone, power   Medications reviewed:  Scheduled Meds:  aspirin   81 mg Oral BID   atorvastatin   40 mg Oral Daily   budesonide -glycopyrrolate -formoterol   2 puff Inhalation BID   clonazePAM   0.5 mg Oral QHS   clopidogrel   75 mg Oral Daily   docusate sodium   100 mg Oral BID   Dulaglutide   4.5 mg Injection Weekly   empagliflozin   10 mg Oral BID WC   And   metFORMIN   1,000 mg Oral BID WC   escitalopram   5 mg  Oral Daily   insulin  aspart  0-15 Units Subcutaneous TID WC   insulin  glargine-yfgn  20 Units Subcutaneous Daily   loratadine   10 mg Oral Daily   montelukast   10 mg Oral QHS   pantoprazole   40 mg Oral Daily   pioglitazone   30 mg Oral Daily   pregabalin   100 mg Oral TID   timolol   1 drop Both Eyes BID   Continuous Infusions:   lactated ringers  Stopped (08/25/24 1807)   Diet: Diet Order             Diet Carb Modified Fluid consistency: Thin; Room service appropriate? Yes  Diet effective now                    Data Reviewed: I have personally reviewed following labs and imaging studies ( see epic result tab) CBC: Recent Labs  Lab 08/25/24 0321 08/26/24 0314  WBC 10.8* 7.0  HGB 12.0* 10.9*  HCT 38.5* 34.8*  MCV 85.0 85.7  PLT 225 202   CMP: Recent Labs  Lab 08/25/24 0321 08/26/24 0314  NA 131* 134*  K 5.1 5.0  CL 97* 98  CO2 24 26  GLUCOSE 320* 215*  BUN 18 28*  CREATININE 0.89 1.08  CALCIUM  9.2 9.0   GFR: Estimated Creatinine Clearance: 89.1 mL/min (by C-G formula based on SCr of 1.08 mg/dL). No results for input(s): AST, ALT, ALKPHOS, BILITOT, PROT, ALBUMIN in the last 168 hours. No results for input(s): LIPASE, AMYLASE in the last 168 hours. No results for input(s): AMMONIA in the last 168 hours. Coagulation Profile: No results for input(s): INR, PROTIME in the last 168 hours. Unresulted Labs (From admission, onward)    None      Antimicrobials/Microbiology: Anti-infectives (From admission, onward)    Start     Dose/Rate Route Frequency Ordered Stop   08/24/24 2300  vancomycin  (VANCOCIN ) IVPB 1000 mg/200 mL premix        1,000 mg 200 mL/hr over 60 Minutes Intravenous  Once 08/24/24 1658 08/25/24 0742   08/24/24 2000  ceFAZolin  (ANCEF ) IVPB 2g/100 mL premix        2 g 200 mL/hr over 30 Minutes Intravenous Every 6 hours 08/24/24 1654 08/25/24 0740   08/24/24 1130  vancomycin  (VANCOREADY) IVPB 1500 mg/300 mL        1,500 mg 150 mL/hr over 120 Minutes Intravenous  Once 08/24/24 1009 08/24/24 1316   08/24/24 1015  ceFAZolin  (ANCEF ) IVPB 2g/100 mL premix        2 g 200 mL/hr over 30 Minutes Intravenous On call to O.R. 08/24/24 1009 08/24/24 1348         Component Value Date/Time   SDES BONE 09/18/2023 1755   SPECREQUEST LEFT FOOT 09/18/2023 1755   CULT   09/18/2023 1755    FEW ENTEROBACTER CLOACAE FEW PROPIONIBACTERIUM SPECIES Standardized susceptibility testing for this organism is not available. NO ANAEROBES ISOLATED; CULTURE IN PROGRESS FOR 5 DAYS    REPTSTATUS 09/24/2023 FINAL 09/18/2023 1755    Procedures: Procedure(s) (LRB): ARTHROPLASTY, KNEE, TOTAL (Right)   Mennie LAMY, MD [facility] 08/26/2024, 10:47 AM

## 2024-08-26 NOTE — Progress Notes (Signed)
    2 Days Post-Op Procedure(s) (LRB): ARTHROPLASTY, KNEE, TOTAL (Right)  Subjective:  Patient reports pain as moderate.  He is doing okay overall.  No overnight events.  Mobilized 83ft + 165ft yesterday with PT.  Has used his inhaler some with symptom improvement since admission.    Blood sugars persistently elevated even with home management and SSI, hospitalist consulted -- see separate note.  Anticipate dispo likely Thurs or Fri depending on blood sugar per patient/hospitalist.  Also discussed keeping log of blood sugars and discussing home regimen with PCP upon dispo as it may need adjusting.  Patient in agreement.  No other concerns.  Yesterday's total administered Morphine  Milligram Equivalents: 100   Objective:   VITALS:   Vitals:   08/25/24 1710 08/25/24 2104 08/25/24 2118 08/26/24 0549  BP: 114/62  119/70 128/72  Pulse:   82 88  Resp:   16 18  Temp:   98.2 F (36.8 C) 98.2 F (36.8 C)  TempSrc:   Oral Oral  SpO2:  98% 98% 98%  Weight:      Height:        Sitting in chair in NAD Vascularly intact Decreased sensation of bilateral feet at baseline, no new sensation changes distally  Intact pulses distally Dorsiflexion/Plantar flexion intact Incision: dressing C/D/I Compartment soft Wiggles toes appropriately   Lab Results  Component Value Date   WBC 7.0 08/26/2024   HGB 10.9 (L) 08/26/2024   HCT 34.8 (L) 08/26/2024   MCV 85.7 08/26/2024   PLT 202 08/26/2024   BMET    Component Value Date/Time   NA 134 (L) 08/26/2024 0314   NA 136 08/03/2024 0927   K 5.0 08/26/2024 0314   CL 98 08/26/2024 0314   CO2 26 08/26/2024 0314   GLUCOSE 215 (H) 08/26/2024 0314   BUN 28 (H) 08/26/2024 0314   BUN 20 08/03/2024 0927   CREATININE 1.08 08/26/2024 0314   CREATININE 1.07 01/31/2021 0957   CALCIUM  9.0 08/26/2024 0314   EGFR 83 08/03/2024 0927   GFRNONAA >60 08/26/2024 0314   GFRNONAA 75 01/31/2021 0957    Xray: Total knee arthroplasty components in good  position no adverse features  Assessment/Plan: 2 Days Post-Op   Principal Problem:   Primary osteoarthritis of right knee Active Problems:   DM (diabetes mellitus), type 2 (HCC)  Status post right knee arthroplasty done 08/24/2024  Hyperglycemia in the setting of type 2 diabetes, continue SSI.  Restarting his home trulicity  per hospitalist.  If improves anticipate dispo Thurs/Fri.  Post op recs: WB: WBAT Abx: ancef +vanc Imaging: PACU xrays DVT prophylaxis: Aspirin  81mg  BID x4 weeks resume plavix  POD2 Follow up: 2 weeks after surgery for a wound check with Dr. Edna at [facility].  Address: [address], [place], [place] [address]  Office Phone: [phone]   Bernarda CHRISTELLA Mclean 08/26/2024, 8:05 AM    Contact information:   Weekdays 7am-5pm epic message Dr. Edna, or call office for patient follow up: [phone] After hours and holidays please check [email or web address] for group call information for [facility]

## 2024-08-27 DIAGNOSIS — M1711 Unilateral primary osteoarthritis, right knee: Secondary | ICD-10-CM | POA: Diagnosis not present

## 2024-08-27 LAB — GLUCOSE, CAPILLARY
Glucose-Capillary: 147 mg/dL — ABNORMAL HIGH (ref 70–99)
Glucose-Capillary: 219 mg/dL — ABNORMAL HIGH (ref 70–99)
Glucose-Capillary: 184 mg/dL — ABNORMAL HIGH (ref 70–99)
Glucose-Capillary: 116 mg/dL — ABNORMAL HIGH (ref 70–99)
Glucose-Capillary: 188 mg/dL — ABNORMAL HIGH (ref 70–99)

## 2024-08-27 MED ORDER — SODIUM CHLORIDE 0.9 % IV BOLUS
500.0000 mL | Freq: Once | INTRAVENOUS | Status: AC
  Administered 2024-08-27: 500 mL via INTRAVENOUS

## 2024-08-27 NOTE — Plan of Care (Signed)
  Problem: Education: Goal: Knowledge of General Education information will improve Description: Including pain rating scale, medication(s)/side effects and non-pharmacologic comfort measures Outcome: Adequate for Discharge   Problem: Health Behavior/Discharge Planning: Goal: Ability to manage health-related needs will improve Outcome: Adequate for Discharge   Problem: Clinical Measurements: Goal: Ability to maintain clinical measurements within normal limits will improve Outcome: Adequate for Discharge Goal: Will remain free from infection Outcome: Adequate for Discharge Goal: Diagnostic test results will improve Outcome: Adequate for Discharge Goal: Respiratory complications will improve Outcome: Adequate for Discharge Goal: Cardiovascular complication will be avoided Outcome: Adequate for Discharge   Problem: Activity: Goal: Risk for activity intolerance will decrease Outcome: Adequate for Discharge   Problem: Nutrition: Goal: Adequate nutrition will be maintained Outcome: Adequate for Discharge   Problem: Coping: Goal: Level of anxiety will decrease Outcome: Adequate for Discharge   Problem: Elimination: Goal: Will not experience complications related to bowel motility Outcome: Adequate for Discharge Goal: Will not experience complications related to urinary retention Outcome: Adequate for Discharge   Problem: Pain Managment: Goal: General experience of comfort will improve and/or be controlled Outcome: Adequate for Discharge   Problem: Safety: Goal: Ability to remain free from injury will improve Outcome: Adequate for Discharge   Problem: Skin Integrity: Goal: Risk for impaired skin integrity will decrease Outcome: Adequate for Discharge   Problem: Education: Goal: Ability to describe self-care measures that may prevent or decrease complications (Diabetes Survival Skills Education) will improve Outcome: Adequate for Discharge Goal: Individualized Educational  Video(s) Outcome: Adequate for Discharge   Problem: Coping: Goal: Ability to adjust to condition or change in health will improve Outcome: Adequate for Discharge   Problem: Fluid Volume: Goal: Ability to maintain a balanced intake and output will improve Outcome: Adequate for Discharge   Problem: Health Behavior/Discharge Planning: Goal: Ability to identify and utilize available resources and services will improve Outcome: Adequate for Discharge Goal: Ability to manage health-related needs will improve Outcome: Adequate for Discharge   Problem: Metabolic: Goal: Ability to maintain appropriate glucose levels will improve Outcome: Adequate for Discharge   Problem: Nutritional: Goal: Maintenance of adequate nutrition will improve Outcome: Adequate for Discharge Goal: Progress toward achieving an optimal weight will improve Outcome: Adequate for Discharge   Problem: Skin Integrity: Goal: Risk for impaired skin integrity will decrease Outcome: Adequate for Discharge   Problem: Tissue Perfusion: Goal: Adequacy of tissue perfusion will improve Outcome: Adequate for Discharge   Problem: Education: Goal: Knowledge of the prescribed therapeutic regimen will improve Outcome: Adequate for Discharge Goal: Individualized Educational Video(s) Outcome: Adequate for Discharge   Problem: Activity: Goal: Ability to avoid complications of mobility impairment will improve Outcome: Adequate for Discharge Goal: Range of joint motion will improve Outcome: Adequate for Discharge   Problem: Clinical Measurements: Goal: Postoperative complications will be avoided or minimized Outcome: Adequate for Discharge   Problem: Pain Management: Goal: Pain level will decrease with appropriate interventions Outcome: Adequate for Discharge   Problem: Skin Integrity: Goal: Will show signs of wound healing Outcome: Adequate for Discharge   Problem: Acute Rehab PT Goals(only PT should resolve) Goal:  Pt Will Go Supine/Side To Sit Outcome: Adequate for Discharge Goal: Patient Will Transfer Sit To/From Stand Outcome: Adequate for Discharge Goal: Pt Will Ambulate Outcome: Adequate for Discharge Goal: Pt Will Go Up/Down Stairs Outcome: Adequate for Discharge

## 2024-08-27 NOTE — Progress Notes (Signed)
 PT TX NOTE   08/27/24 1400  PT Visit Information  Last PT Received On 08/27/24  Assistance Needed States he is not comfortable. Removed bonefoam. Pt able to reposition in bed with cues, no physical assist, incr comfort reported. Pt agreeable to TKA exercises.  Low threshold for knee flexion this session.   pt is ready to d/c home with family assist from PT standpoint. Pt states he wants to stay in bed. Pt encouraged to d/c home, continue on with next phase of his recovery. Rn updated.    History of Present Illness 63 yo male s/p R TKA on 08/24/24. 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, DM, HTN, PVD  Precautions  Precautions Fall;Knee  Recall of Precautions/Restrictions Intact  Restrictions  Weight Bearing Restrictions Per Provider Order No  Other Position/Activity Restrictions WBAT  Pain Assessment  Pain Assessment Faces  Faces Pain Scale 6  Pain Location right knee  Pain Descriptors / Indicators Sore;Aching;Tightness;Grimacing;Guarding  Pain Intervention(s) Limited activity within patient's tolerance;Monitored during session;Premedicated before session;Repositioned;Ice applied  Cognition  Arousal Alert  Behavior During Therapy WFL for tasks assessed/performed  PT - Cognitive impairments No apparent impairments  Following Commands  Following commands Intact  Cueing  Cueing Techniques Verbal cues  Communication  Communication No apparent difficulties  Total Joint Exercises  Ankle Circles/Pumps AROM;Both;10 reps  Heel Slides AAROM;Right;10 reps;Limitations  Heel Slides Limitations pain/gurading-low threshold for knee flexion  Straight Leg Raises AROM;Right;AAROM;15 reps  PT - End of Session  Equipment Utilized During Treatment Gait belt  Activity Tolerance Patient tolerated treatment well  Patient left with call bell/phone within reach;with bed alarm set;in bed  Nurse Communication Mobility status   PT - Assessment/Plan  PT Visit Diagnosis Other abnormalities of gait and  mobility (R26.89)  PT Frequency (ACUTE ONLY) 7X/week  Follow Up Recommendations Follow physician's recommendations for discharge plan and follow up therapies  Patient can return home with the following Assist for transportation;Help with stairs or ramp for entrance;Assistance with cooking/housework  PT equipment None recommended by PT  AM-PAC PT 6 Clicks Mobility Outcome Measure (Version 2)  Help needed turning from your back to your side while in a flat bed without using bedrails? 3  Help needed moving from lying on your back to sitting on the side of a flat bed without using bedrails? 3  Help needed moving to and from a bed to a chair (including a wheelchair)? 3  Help needed standing up from a chair using your arms (e.g., wheelchair or bedside chair)? 3  Help needed to walk in hospital room? 3  Help needed climbing 3-5 steps with a railing?  3  6 Click Score 18  Consider Recommendation of Discharge To: Home with [facility]  PT Goal Progression  Progress towards PT goals Progressing toward goals  Acute Rehab PT Goals  PT Goal Formulation With patient  Time For Goal Achievement 09/01/24  Potential to Achieve Goals Good  PT Time Calculation  PT Start Time (ACUTE ONLY) 1400  PT Stop Time (ACUTE ONLY) 1415  PT Time Calculation (min) (ACUTE ONLY) 15 min  PT General Charges  $$ ACUTE PT VISIT 1 Visit  PT Treatments  $Therapeutic Exercise 8-22 mins

## 2024-08-27 NOTE — Progress Notes (Signed)
 PROGRESS NOTE Richard [name]  FMW:[id number] DOB: [birth year] DOA: 08/24/2024 PCP: Rosan Dayton [name], DO  Brief Narrative/Hospital Course: Richard [name] is a 63 y.o. male with PMH of  type 2 diabetes mellitus, COPD, moderate aortic stenosis and mitral stenosis, hypertension, hyperlipidemia, history of peripheral arterial disease and amputation of left toes, iron  deficiency anemia and severe osteoarthritis of the right knee who was admitted and had a successful right knee replacement surgery.  TRH consulted for uncontrolled hyperglycemia.  Subjective: Seen and examined today Alert awake oriented complains of pain on the right knee Overnight afebrile hemodynamically stable blood sugar controlled  Assessment and plan:  T2dm w/ uncontrolled hyperglycemia: Meds has been adjusted, blood sugar overall fairly controlled on current regimen with metformin , Actos , Semglee  20 units daily, SSI 0-15 units, Jardiance  and weekly dulaglutide .  Will continue to monitor and adjust while hospitalized, he will resume his home meds upon discharge and monitor blood sugar. Recent Labs  Lab 08/26/24 0753 08/26/24 1132 08/26/24 1619 08/26/24 2041 08/27/24 0729  GLUCAP 177* 162* 172* 84 147*    Hyperlipidemia: Continue statin  COPD: Not in exacerbation cont nebs  Moderate aortic stenosis Mitral stenosis: Follow-up outpatient  Hypertension: Well-controlled, continue meds prn.  PAD Amputation of left toes. Continue Plavix , aspirin  and  statin  IDA: Hemoglobin stable, monitor  Severe osteoarthritis of the right knee S/P replacement Continue pain management DVT prophylaxis and disposition as per primary orthopedic team currently on aspirin  81 twice daily.  Of note patient on home Plavix   Anxiety disorder: Continue Klonopin , Celexa   Mobility: PT Orders: Active PT Follow up Rec: Follow Physician's Recommendations For Discharge Plan And Follow Up Therapies11/19/2025 1500   DVT prophylaxis: SCDs  Start: 08/24/24 1654 Place TED hose Start: 08/24/24 1654 Code Status:   Code Status: Full Code Family Communication: plan of care discussed with patient at bedside. Patient status is: Remains hospitalized because of severity of illness Level of care: Med-Surg   Dispo: The patient is from: home            Anticipated disposition: Noted plan for discharge home today once cleared by PT   Objective: Vitals last 24 hrs: Vitals:   08/26/24 2037 08/27/24 0631 08/27/24 0911 08/27/24 0913  BP: 134/81 (!) 144/69    Pulse: 100 (!) 105    Resp: 18 16    Temp: 98.3 F (36.8 C) 99.1 F (37.3 C)    TempSrc:  Oral    SpO2: 100% 98% 98% 98%  Weight:      Height:        Physical Examination: General exam:AAOX3 HEENT:Oral mucosa moist, Ear/Nose WNL grossly Respiratory system: Bilaterally clear BS,no use of accessory muscle Cardiovascular system: S1 & S2 +, No JVD. Gastrointestinal system: Abdomen soft,NT,ND, BS+ Nervous System: Alert, awake, moving all extremities,and following commands. Extremities: extremities warm, leg edema neg, RT Knee  w/ brace alogn w/ dressing  Skin: Warm, no rashes MSK: Normal muscle bulk,tone, power   Medications reviewed:  Scheduled Meds:  arformoterol   15 mcg Nebulization BID   aspirin   81 mg Oral BID   atorvastatin   40 mg Oral Daily   clonazePAM   0.5 mg Oral QHS   clopidogrel   75 mg Oral Daily   docusate sodium   100 mg Oral BID   Dulaglutide   4.5 mg Injection Weekly   empagliflozin   10 mg Oral BID WC   And   metFORMIN   1,000 mg Oral BID WC   escitalopram   5 mg Oral Daily   insulin   aspart  0-15 Units Subcutaneous TID WC   insulin  glargine-yfgn  20 Units Subcutaneous Daily   loratadine   10 mg Oral Daily   montelukast   10 mg Oral QHS   pantoprazole   40 mg Oral Daily   pioglitazone   30 mg Oral Daily   pregabalin   100 mg Oral TID   revefenacin   175 mcg Nebulization Daily   timolol   1 drop Both Eyes BID   Continuous Infusions:  lactated ringers   Stopped (08/25/24 1807)   Diet: Diet Order             Diet - low sodium heart healthy           Diet Carb Modified Fluid consistency: Thin; Room service appropriate? Yes  Diet effective now                    Data Reviewed: I have personally reviewed following labs and imaging studies ( see epic result tab) CBC: Recent Labs  Lab 08/25/24 0321 08/26/24 0314  WBC 10.8* 7.0  HGB 12.0* 10.9*  HCT 38.5* 34.8*  MCV 85.0 85.7  PLT 225 202   CMP: Recent Labs  Lab 08/25/24 0321 08/26/24 0314  NA 131* 134*  K 5.1 5.0  CL 97* 98  CO2 24 26  GLUCOSE 320* 215*  BUN 18 28*  CREATININE 0.89 1.08  CALCIUM  9.2 9.0   GFR: Estimated Creatinine Clearance: 89.1 mL/min (by C-G formula based on SCr of 1.08 mg/dL). No results for input(s): AST, ALT, ALKPHOS, BILITOT, PROT, ALBUMIN in the last 168 hours. No results for input(s): LIPASE, AMYLASE in the last 168 hours. No results for input(s): AMMONIA in the last 168 hours. Coagulation Profile: No results for input(s): INR, PROTIME in the last 168 hours. Unresulted Labs (From admission, onward)    None      Antimicrobials/Microbiology: Anti-infectives (From admission, onward)    Start     Dose/Rate Route Frequency Ordered Stop   08/24/24 2300  vancomycin  (VANCOCIN ) IVPB 1000 mg/200 mL premix        1,000 mg 200 mL/hr over 60 Minutes Intravenous  Once 08/24/24 1658 08/25/24 0742   08/24/24 2000  ceFAZolin  (ANCEF ) IVPB 2g/100 mL premix        2 g 200 mL/hr over 30 Minutes Intravenous Every 6 hours 08/24/24 1654 08/25/24 0740   08/24/24 1130  vancomycin  (VANCOREADY) IVPB 1500 mg/300 mL        1,500 mg 150 mL/hr over 120 Minutes Intravenous  Once 08/24/24 1009 08/24/24 1316   08/24/24 1015  ceFAZolin  (ANCEF ) IVPB 2g/100 mL premix        2 g 200 mL/hr over 30 Minutes Intravenous On call to O.R. 08/24/24 1009 08/24/24 1348         Component Value Date/Time   SDES BONE 09/18/2023 1755   SPECREQUEST LEFT  FOOT 09/18/2023 1755   CULT  09/18/2023 1755    FEW ENTEROBACTER CLOACAE FEW PROPIONIBACTERIUM SPECIES Standardized susceptibility testing for this organism is not available. NO ANAEROBES ISOLATED; CULTURE IN PROGRESS FOR 5 DAYS    REPTSTATUS 09/24/2023 FINAL 09/18/2023 1755    Procedures: Procedure(s) (LRB): ARTHROPLASTY, KNEE, TOTAL (Right)   Mennie LAMY, MD [facility] 08/27/2024, 10:22 AM

## 2024-08-27 NOTE — Progress Notes (Signed)
 Physical Therapy Treatment Patient Details Name: Richard [name] MRN: [id number] DOB: 1961-02-15 Today's Date: 08/27/2024   History of Present Illness 63 yo male s/p R TKA on 08/24/24. 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 Pt with c/o elevated pain, was premedicated prior to PT session, RN help IV meds d/t d/c today;  pt reassured this is normal following a surgery, stressful life event. Pt reports to PT that he fell with nursing staff but did not actually land on the floor when further questioned. Per RN, no report of pt having a fall.   Pt able to amb short household distance today, has excellent UB strength and is able to off load RLE as needed for pain control. Pt was able to ascend 3 steps with bil rails, CGA and cues for sequence, rest breaks needed d/t pain. Prior to today's session pt has been amb ~ 100'/long household distances.   Pt meeting goals for d/c home today with family assisting as needed. Pt returned to bed post session this morning, strongly encouraged frequent amb/activity at home. Pt advised he may need to sleep downstairs at home for a few days until activity tolerance improves.  Plan is for OPPT    If plan is discharge home, recommend the following: Assist for transportation;Help with stairs or ramp for entrance;Assistance with cooking/housework   Can travel by private vehicle        Equipment Recommendations  None recommended by PT    Recommendations for Other Services       Precautions / Restrictions Precautions Precautions: Fall;Knee Recall of Precautions/Restrictions: Intact Restrictions Weight Bearing Restrictions Per Provider Order: No Other Position/Activity Restrictions: WBAT     Mobility  Bed Mobility Overal bed mobility: Needs Assistance Bed Mobility: Sit to Supine, Supine to Sit     Supine to sit: Supervision Sit to supine: Supervision    General bed mobility comments: incr time, cues to self assist and position RLE using leg lifter    Transfers Overall transfer level: Needs assistance Equipment used: Rolling walker (2 wheels) Transfers: Sit to/from Stand Sit to Stand: Contact guard assist           General transfer comment: cues for hand placement and RLE postioning    Ambulation/Gait Ambulation/Gait assistance: Contact guard assist, Supervision Gait Distance (Feet): 40 Feet Assistive device: Rolling walker (2 wheels) Gait Pattern/deviations: Step-to pattern, Decreased stance time - right, Antalgic Gait velocity: decr     General Gait Details: ongoing education for RW positioning, step length and sequence, use of UEs as needed to offload RLE for pain control. multiple standing rest breaks d/t pain-pt grabbing sink/counter/rail in hallway with c/o pain. pt redirected to maintain 2 hands on RW. no overt LOB- CGA for safety   Stairs Stairs: Yes Stairs assistance: Contact guard assist Stair Management: Two rails, Step to pattern, With walker Number of Stairs: 3 General stair comments: cue for sequence, incr time needed. CGA for safety - pt needing rest breaks d/t pain   Wheelchair Mobility     Tilt Bed    Modified Rankin (Stroke Patients Only)       Balance                                            Communication  Communication Communication: No apparent difficulties  Cognition Arousal: Alert Behavior During Therapy: WFL for tasks assessed/performed   PT - Cognitive impairments: No apparent impairments                         Following commands: Intact      Cueing Cueing Techniques: Verbal cues  Exercises      General Comments        Pertinent Vitals/Pain Pain Assessment Pain Assessment: Faces Faces Pain Scale: Hurts even more Pain Location: right knee Pain Descriptors / Indicators: Sore, Aching, Tightness, Grimacing, Guarding Pain Intervention(s):  Limited activity within patient's tolerance, Monitored during session, Premedicated before session, Repositioned, Ice applied    Home Living                          Prior Function            PT Goals (current goals can now be found in the care plan section) Acute Rehab PT Goals PT Goal Formulation: With patient Time For Goal Achievement: 09/01/24 Potential to Achieve Goals: Good Progress towards PT goals: Progressing toward goals    Frequency    7X/week      PT Plan      Co-evaluation              AM-PAC PT 6 Clicks Mobility   Outcome Measure  Help needed turning from your back to your side while in a flat bed without using bedrails?: A Little Help needed moving from lying on your back to sitting on the side of a flat bed without using bedrails?: A Little Help needed moving to and from a bed to a chair (including a wheelchair)?: A Little Help needed standing up from a chair using your arms (e.g., wheelchair or bedside chair)?: A Little Help needed to walk in hospital room?: A Little Help needed climbing 3-5 steps with a railing? : A Little 6 Click Score: 18    End of Session Equipment Utilized During Treatment: Gait belt Activity Tolerance: Patient tolerated treatment well;Patient limited by pain Patient left: with call bell/phone within reach;with bed alarm set;in bed Nurse Communication: Mobility status PT Visit Diagnosis: Other abnormalities of gait and mobility (R26.89)     Time: 8976-8943 PT Time Calculation (min) (ACUTE ONLY): 33 min  Charges:    $Gait Training: 23-37 mins PT General Charges $$ ACUTE PT VISIT: 1 Visit                     Verbie Babic, PT  Acute Rehab Dept [facility]) [phone]  08/27/2024    [facility] 08/27/2024, 11:39 AM

## 2024-08-27 NOTE — Progress Notes (Signed)
     Subjective:  Patient reports pain as moderate.  Reports significant stiffness.  Encouraged him to continue to mobilize multiple times sizes in bed.  Blood glucose better controlled now.  Hospitalist is following.  Recs appreciated.  Discharge home today pending PT clearance  Yesterday's total administered Morphine  Milligram Equivalents: 120   Objective:   VITALS:   Vitals:   08/26/24 0549 08/26/24 1320 08/26/24 2037 08/27/24 0631  BP: 128/72 130/76 134/81 (!) 144/69  Pulse: 88 94 100 (!) 105  Resp: 18 16 18 16   Temp: 98.2 F (36.8 C) 98.6 F (37 C) 98.3 F (36.8 C) 99.1 F (37.3 C)  TempSrc: Oral Oral  Oral  SpO2: 98% 96% 100% 98%  Weight:      Height:        Sensation intact distally Intact pulses distally Dorsiflexion/Plantar flexion intact Incision: dressing C/D/I    Lab Results  Component Value Date   WBC 7.0 08/26/2024   HGB 10.9 (L) 08/26/2024   HCT 34.8 (L) 08/26/2024   MCV 85.7 08/26/2024   PLT 202 08/26/2024   BMET    Component Value Date/Time   NA 134 (L) 08/26/2024 0314   NA 136 08/03/2024 0927   K 5.0 08/26/2024 0314   CL 98 08/26/2024 0314   CO2 26 08/26/2024 0314   GLUCOSE 215 (H) 08/26/2024 0314   BUN 28 (H) 08/26/2024 0314   BUN 20 08/03/2024 0927   CREATININE 1.08 08/26/2024 0314   CREATININE 1.07 01/31/2021 0957   CALCIUM  9.0 08/26/2024 0314   EGFR 83 08/03/2024 0927   GFRNONAA >60 08/26/2024 0314   GFRNONAA 75 01/31/2021 0957    Xray: Total knee arthroplasty components in good position no adverse features  Assessment/Plan: 3 Days Post-Op   Principal Problem:   Primary osteoarthritis of right knee Active Problems:   DM (diabetes mellitus), type 2 (HCC)  Status post right knee arthroplasty done 08/24/2024  Hyperglycemia in the setting of type 2 diabetes: Hospitalist following, recs appreciated  Post op recs: WB: WBAT Abx: ancef +vanc Imaging: PACU xrays DVT prophylaxis: Aspirin  81mg  BID x4 weeks resume plavix   POD2 Follow up: 2 weeks after surgery for a wound check with Dr. Edna at [facility].  Address: [address], [place], [place] [address]  Office Phone: [phone]   TORIBIO DELENA EDNA 08/27/2024, 7:11 AM   Toribio Edna, MD  Contact information:   [phone] 7am-5pm epic message Dr. Edna, or call office for patient follow up: [phone] After hours and holidays please check [email or web address] for group call information for [facility]

## 2024-08-27 NOTE — Discharge Summary (Cosign Needed Addendum)
 Physician Discharge Summary  Patient ID: Richard [name] MRN: [id number] DOB/AGE: November 12, 1960 63 y.o.  Admit date: 08/24/2024 Discharge date: 08/28/2024  Admission Diagnoses:  Primary osteoarthritis of right knee  Discharge Diagnoses:  Principal Problem:   Primary osteoarthritis of right knee Active Problems:   DM (diabetes mellitus), type 2 (HCC)   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 male who was admitted 08/24/2024 with a diagnosis of Primary osteoarthritis of right knee and went to the operating room on 08/24/2024 and underwent the above named procedures.  Notable hyperglycemia in hospital setting initially post-operatively, however improved with SSI, fluid management, and hospitalist recommendations.  Mild abdominal discomfort after restarting Trulicity , which improved without intervention.  He was given perioperative antibiotics:  Anti-infectives  (From admission, onward)    Start     Dose/Rate Route Frequency Ordered Stop   08/24/24 2300  vancomycin  (VANCOCIN ) IVPB 1000 mg/200 mL premix        1,000 mg 200 mL/hr over 60 Minutes Intravenous  Once 08/24/24 1658 08/25/24 0742   08/24/24 2000  ceFAZolin  (ANCEF ) IVPB 2g/100 mL premix        2 g 200 mL/hr over 30 Minutes Intravenous Every 6 hours 08/24/24 1654 08/25/24 0740   08/24/24 1130  vancomycin  (VANCOREADY) IVPB 1500 mg/300 mL        1,500 mg 150 mL/hr over 120 Minutes Intravenous  Once 08/24/24 1009 08/24/24 1316   08/24/24 1015  ceFAZolin  (ANCEF ) IVPB 2g/100 mL premix        2 g 200 mL/hr over 30 Minutes Intravenous On call to O.R. 08/24/24 1009 08/24/24 1348     .  He was given sequential compression devices, early ambulation, and aspirin  and plavix  for DVT prophylaxis.  He benefited maximally from the hospital stay and there were no complications.    Recent vital signs:  Vitals:   08/26/24 2037 08/27/24 0631  BP: 134/81 (!) 144/69  Pulse: 100 (!) 105  Resp: 18 16  Temp: 98.3 F (36.8 C) 99.1 F (37.3 C)  SpO2: 100% 98%    Recent laboratory studies:  Lab Results  Component Value Date   HGB 10.9 (L) 08/26/2024   HGB 12.0 (L) 08/25/2024   HGB 13.1 08/03/2024   Lab Results  Component Value Date  WBC 7.0 08/26/2024   PLT 202 08/26/2024   Lab Results  Component Value Date   INR 1.0 11/19/2022   Lab Results  Component Value Date   NA 134 (L) 08/26/2024   K 5.0 08/26/2024   CL 98 08/26/2024   CO2 26 08/26/2024   BUN 28 (H) 08/26/2024   CREATININE 1.08 08/26/2024   GLUCOSE 215 (H) 08/26/2024    Discharge Medications:   Allergies as of 08/27/2024       Reactions   Shellfish Allergy Anaphylaxis, Shortness Of Breath        Medication List     STOP taking these medications    naproxen  500 MG tablet Commonly known as: Naprosyn        TAKE these medications    Accu-Chek Softclix Lancets lancets 1 each by Other route See admin  instructions. 1-2 times daily   acetaminophen  500 MG tablet Commonly known as: TYLENOL  Take 2 tablets (1,000 mg total) by mouth every 8 (eight) hours as needed.   albuterol  108 (90 Base) MCG/ACT inhaler Commonly known as: VENTOLIN  HFA Inhale 1-2 puffs into the lungs every 4 (four) hours as needed for wheezing or shortness of breath.   ascorbic acid 500 MG tablet Commonly known as: VITAMIN C Take 500 mg by mouth daily.   aspirin  EC 81 MG tablet Take 1 tablet (81 mg total) by mouth 2 (two) times daily for 28 days. Swallow whole. What changed:  when to take this additional instructions   atorvastatin  40 MG tablet Commonly known as: LIPITOR Take 1 tablet (40 mg total) by mouth daily.   Breztri  Aerosphere 160-9-4.8 MCG/ACT Aero inhaler Generic drug: budesonide -glycopyrrolate -formoterol  Inhale 2 puffs into the lungs in the morning and at bedtime.   budesonide  0.5 MG/2ML nebulizer solution Commonly known as: Pulmicort  Take 2 mLs (0.5 mg total) by nebulization every 12 (twelve) hours.   cetirizine  10 MG tablet Commonly known as: ZyrTEC  Allergy Take 1 tablet (10 mg total) by mouth daily. What changed:  when to take this reasons to take this   clobetasol  cream 0.05 % Commonly known as: TEMOVATE  Apply 1 Application topically 2 (two) times daily. Use for 2 weeks on, 2 weeks off. Repeat PRN. What changed: additional instructions   clonazePAM  0.5 MG tablet Commonly known as: KlonoPIN  Take 1 tablet (0.5 mg total) by mouth at bedtime. Start with 0.5 mg nightly, can go up to 1 mg in about a week   clopidogrel  75 MG tablet Commonly known as: PLAVIX  Take 1 tablet (75 mg total) by mouth daily.   diphenhydrAMINE  25 mg capsule Commonly known as: Benadryl  Allergy Take 1 capsule (25 mg total) by mouth at bedtime as needed for allergies or itching.   Dupixent 300 MG/2ML Soaj Generic drug: Dupilumab Inject 300 mg into the skin every 14 (fourteen) days.   escitalopram  5 MG  tablet Commonly known as: Lexapro  Take 1 tablet (5 mg total) by mouth daily.   fluticasone  50 MCG/ACT nasal spray Commonly known as: FLONASE  Place 1 spray into both nostrils as needed for allergies.   FreeStyle Libre 3 Plus Sensor Misc Place 1 sensor on the skin every 15 days. Use to check glucose continuously.   glucose blood test strip 1 each by Other route in the morning and at bedtime.   ipratropium-albuterol  0.5-2.5 (3) MG/3ML Soln Commonly known as: DUONEB Use 1 vial (3 mLs) by nebulization every 6 (six) hours as needed.   meloxicam  15 MG tablet Commonly known as: MOBIC  Take 1 tablet (  15 mg total) by mouth daily.   methocarbamol  500 MG tablet Commonly known as: ROBAXIN  Take 1 tablet (500 mg total) by mouth every 8 (eight) hours as needed for up to 10 days for muscle spasms.   montelukast  10 MG tablet Commonly known as: SINGULAIR  Take 1 tablet (10 mg total) by mouth at bedtime.   multivitamin tablet Take 1 tablet by mouth daily.   omeprazole  40 MG capsule Commonly known as: PRILOSEC Take 1 capsule (40 mg total) by mouth daily for 21 days.   ondansetron  4 MG tablet Commonly known as: Zofran  Take 1 tablet (4 mg total) by mouth every 8 (eight) hours as needed for up to 14 days for nausea or vomiting.   oxyCODONE  5 MG immediate release tablet Commonly known as: Roxicodone  Take 1 tablet (5 mg total) by mouth every 4 (four) hours as needed for up to 7 days for severe pain (pain score 7-10) or moderate pain (pain score 4-6).   pioglitazone  30 MG tablet Commonly known as: ACTOS  Take 30 mg by mouth daily.   polyethylene glycol 17 g packet Commonly known as: MiraLax  Take 17 g by mouth daily.   pregabalin  100 MG capsule Commonly known as: LYRICA  Take 1 capsule (100 mg total) by mouth 3 (three) times daily.   Synjardy  12.02-999 MG Tabs Generic drug: Empagliflozin -metFORMIN  HCl Take 1 tablet by mouth 2 (two) times daily.   tadalafil  10 MG tablet Commonly known  as: Cialis  Take 1 tablet (10 mg total) by mouth as needed for erectile dysfunction. WAIT THREE DAYS FROM SURGERY BEFORE RESTARTING What changed: additional instructions   TechLite Pen Needles 32G X 4 MM Misc Generic drug: Insulin  Pen Needle Use 1 each daily as directed.   timolol  0.5 % ophthalmic solution Commonly known as: TIMOPTIC  Place 1 drop into both eyes 2 (two) times daily.   Tresiba  FlexTouch 100 UNIT/ML FlexTouch Pen Generic drug: insulin  degludec Inject 20 Units into the skin daily.   triamcinolone  cream 0.1 % Commonly known as: KENALOG  Apply 1 Application topically 2 (two) times daily. Use for 2 weeks on, 2 weeks off. Repeat PRN. What changed: additional instructions   Trulicity  4.5 MG/0.5ML Soaj Generic drug: Dulaglutide  Inject 4.5 mg as directed once a week. Fridays -- WAIT 2 WEEKS AFTER SURGERY BEFORE RESTARTING What changed: additional instructions        Diagnostic Studies: DG Knee Right Port Result Date: 08/25/2024 CLINICAL DATA:  Status post right knee replacement. EXAM: PORTABLE RIGHT KNEE - 1-2 VIEW COMPARISON:  02/10/2023. FINDINGS: Status post right total knee arthroplasty with normal alignment. No acute fracture. Expected postoperative soft tissue swelling, soft tissue and intra-articular air anteriorly. Peripheral vascular calcifications. IMPRESSION: Status post right total knee arthroplasty with normal alignment. No evidence of acute complication. Electronically Signed   By: Harrietta Sherry M.D.   On: 08/25/2024 09:48    Disposition: Discharge disposition: 01-Home or Self Care       Discharge Instructions     Call MD / Call 911   Complete by: As directed    If you experience chest pain or shortness of breath, CALL 911 and be transported to the hospital emergency room.  If you develope a fever above 101 F, pus (white drainage) or increased drainage or redness at the wound, or calf pain, call your surgeon's office.   Constipation Prevention    Complete by: As directed    Drink plenty of fluids.  Prune juice may be helpful.  You may use a stool  softener, such as Colace (over the counter) 100 mg twice a day.  Use MiraLax  (over the counter) for constipation as needed.   Diet - low sodium heart healthy   Complete by: As directed    Increase activity slowly as tolerated   Complete by: As directed    Post-operative opioid taper instructions:   Complete by: As directed    POST-OPERATIVE OPIOID TAPER INSTRUCTIONS: It is important to wean off of your opioid medication as soon as possible. If you do not need pain medication after your surgery it is ok to stop day one. Opioids include: Codeine, Hydrocodone (Norco, Vicodin), Oxycodone (Percocet, oxycontin ) and hydromorphone  amongst others.  Long term and even short term use of opiods can cause: Increased pain response Dependence Constipation Depression Respiratory depression And more.  Withdrawal symptoms can include Flu like symptoms Nausea, vomiting And more Techniques to manage these symptoms Hydrate well Eat regular healthy meals Stay active Use relaxation techniques(deep breathing, meditating, yoga) Do Not substitute Alcohol  to help with tapering If you have been on opioids for less than two weeks and do not have pain than it is ok to stop all together.  Plan to wean off of opioids This plan should start within one week post op of your joint replacement. Maintain the same interval or time between taking each dose and first decrease the dose.  Cut the total daily intake of opioids by one tablet each day Next start to increase the time between doses. The last dose that should be eliminated is the evening dose.           Follow-up Information     Edna Toribio LABOR, MD. Go on 09/08/2024.   Specialty: Orthopedic Surgery Why: your appointment is scheduled for 2:30 Contact information: [address] [place] [address] [phone]          [facility], [place]. Go on 08/27/2024.   Why: your outpatient physical therapy is scheduled and they will call you with a time Contact information: [facility] [address] [place] [address] [phone]                    Discharge Instructions      INSTRUCTIONS AFTER JOINT REPLACEMENT   Remove items at home which could result in a fall. This includes throw rugs or furniture in walking pathways ICE to the affected joint every three hours while awake for 30 minutes at a time, for at least the first 3-5 days, and then as needed for pain and swelling.  Continue to use ice for pain and swelling. You may notice swelling that will progress down to the foot and ankle.  This is normal after surgery.  Elevate your leg when you are not up walking on it.   Continue to use the breathing machine you got in the hospital (incentive spirometer) which will help keep your temperature down.  It is common for your temperature to cycle up and down following surgery, especially at night when you are not up moving around and exerting yourself.  The breathing machine keeps your lungs expanded and your temperature down.  DIET:  As you were doing prior to hospitalization, we recommend a well-balanced diet.  DRESSING / WOUND CARE / SHOWERING:  Keep the surgical dressing until follow up.  The dressing is water  proof, so you can shower without any extra covering.  IF THE DRESSING FALLS OFF or the wound gets wet inside, change the dressing with  sterile gauze.  Please use good hand washing techniques before changing the dressing.  Do not use any lotions or creams on the incision until instructed by your surgeon.    ACTIVITY  Increase activity slowly as tolerated, but follow the weight bearing instructions below.   No driving for 6 weeks or until further direction given by your physician.  You cannot drive while taking narcotics.  No lifting or carrying greater  than 10 lbs. until further directed by your surgeon. Avoid periods of inactivity such as sitting longer than an hour when not asleep. This helps prevent blood clots.  You may return to work once you are authorized by your doctor.   WEIGHT BEARING: Weight bearing as tolerated with assist device (walker, cane, etc) as directed, use it as long as suggested by your surgeon or therapist, typically at least 4-6 weeks.  EXERCISES  Results after joint replacement surgery are often greatly improved when you follow the exercise, range of motion and muscle strengthening exercises prescribed by your doctor. Safety measures are also important to protect the joint from further injury. Any time any of these exercises cause you to have increased pain or swelling, decrease what you are doing until you are comfortable again and then slowly increase them. If you have problems or questions, call your caregiver or physical therapist for advice.   Rehabilitation is important following a joint replacement. After just a few days of immobilization, the muscles of the leg can become weakened and shrink (atrophy).  These exercises are designed to build up the tone and strength of the thigh and leg muscles and to improve motion. Often times heat used for twenty to thirty minutes before working out will loosen up your tissues and help with improving the range of motion but do not use heat for the first two weeks following surgery (sometimes heat can increase post-operative swelling).   These exercises can be done on a training (exercise) mat, on the floor, on a table or on a bed. Use whatever works the best and is most comfortable for you.    Use music or television while you are exercising so that the exercises are a pleasant break in your day. This will make your life better with the exercises acting as a break in your routine that you can look forward to.   Perform all exercises about fifteen times, three times per day or as  directed.  You should exercise both the operative leg and the other leg as well.  Exercises include:   Quad Sets - Tighten up the muscle on the front of the thigh (Quad) and hold for 5-10 seconds.   Straight Leg Raises - With your knee straight (if you were given a brace, keep it on), lift the leg to 60 degrees, hold for 3 seconds, and slowly lower the leg.  Perform this exercise against resistance later as your leg gets stronger.  Leg Slides: Lying on your back, slowly slide your foot toward your buttocks, bending your knee up off the floor (only go as far as is comfortable). Then slowly slide your foot back down until your leg is flat on the floor again.  Angel Wings: Lying on your back spread your legs to the side as far apart as you can without causing discomfort.  Hamstring Strength:  Lying on your back, push your heel against the floor with your leg straight by tightening up the muscles of your buttocks.  Repeat, but this time bend your knee  to a comfortable angle, and push your heel against the floor.  You may put a pillow under the heel to make it more comfortable if necessary.   A rehabilitation program following joint replacement surgery can speed recovery and prevent re-injury in the future due to weakened muscles. Contact your doctor or a physical therapist for more information on knee rehabilitation.   CONSTIPATION:  Constipation is defined medically as fewer than three stools per week and severe constipation as less than one stool per week.  Even if you have a regular bowel pattern at home, your normal regimen is likely to be disrupted due to multiple reasons following surgery.  Combination of anesthesia, postoperative narcotics, change in appetite and fluid intake all can affect your bowels.   YOU MUST use at least one of the following options; they are listed in order of increasing strength to get the job done.  They are all available over the counter, and you may need to use some,  POSSIBLY even all of these options:    Drink plenty of fluids (prune juice may be helpful) and high fiber foods Colace 100 mg by mouth twice a day  Senokot for constipation as directed and as needed Dulcolax (bisacodyl ), take with full glass of water   Miralax  (polyethylene glycol) once or twice a day as needed.  If you have tried all these things and are unable to have a bowel movement in the first 3-4 days after surgery call either your surgeon or your primary doctor.    If you experience loose stools or diarrhea, hold the medications until you stool forms back up.  If your symptoms do not get better within 1 week or if they get worse, check with your doctor.  If you experience the worst abdominal pain ever or develop nausea or vomiting, please contact the office immediately for further recommendations for treatment.  ITCHING:  If you experience itching with your medications, try taking only a single pain pill, or even half a pain pill at a time.  You can also use Benadryl  over the counter for itching or also to help with sleep.   TED HOSE STOCKINGS:  Use stockings on both legs until for at least 2 weeks or as directed by physician office. They may be removed at night for sleeping.  MEDICATIONS:  See your medication summary on the "After Visit Summary" that nursing will review with you.  You may have some home medications which will be placed on hold until you complete the course of blood thinner medication.  It is important for you to complete the blood thinner medication as prescribed.  Blood clot prevention (DVT Prophylaxis): After surgery you are at an increased risk for a blood clot.  You were prescribed a blood thinner, Aspirin  81mg , to be taken twice daily for a total of 4 weeks from surgery to help reduce your risk of getting a blood clot.  After the 4 weeks, you may stop the Aspirin .  Keep in mind, this is in addition to your Plavix , which you will restart on the second day after surgery  (Wednesday 08/26/24), then continue taking this as prescribed.    Signs of a pulmonary embolus (blood clot in the lungs) include sudden short of breath, feeling lightheaded or dizzy, chest pain with a deep breath, rapid pulse rapid breathing.  Signs of a blood clot in your arms or legs include new unexplained swelling and cramping, warm, red or darkened skin around the painful area.  Please  call the office or 911 right away if these signs or symptoms develop.  PRECAUTIONS:   If you experience chest pain or shortness of breath - call 911 immediately for transfer to the hospital emergency department.   If you develop a fever greater that 101 F, purulent drainage from wound, increased redness or drainage from wound, foul odor from the wound/dressing, or calf pain - CONTACT YOUR SURGEON.                                                   FOLLOW-UP APPOINTMENTS:  If you do not already have a post-op appointment, please call the office for an appointment to be seen by your surgeon.  Guidelines for how soon to be seen are listed in your "After Visit Summary", but are typically between 2-3 weeks after surgery.  If you have a specialized bandage, you may be told to follow up 1 week after surgery.  OTHER INSTRUCTIONS:  Knee Replacement:  Do not place pillow under knee, focus on keeping the knee straight while resting.  Place foam block, curve side up under heel at all times except when walking.  DO NOT modify, tear, cut, or change the foam block in any way.  POST-OPERATIVE OPIOID TAPER INSTRUCTIONS: It is important to wean off of your opioid medication as soon as possible. If you do not need pain medication after your surgery it is ok to stop day one. Opioids include: Codeine, Hydrocodone (Norco, Vicodin), Oxycodone (Percocet, oxycontin ) and hydromorphone  amongst others.  Long term and even short term use of opiods can cause: Increased pain response Dependence Constipation Depression Respiratory  depression And more.  Withdrawal symptoms can include Flu like symptoms Nausea, vomiting And more Techniques to manage these symptoms Hydrate well Eat regular healthy meals Stay active Use relaxation techniques(deep breathing, meditating, yoga) Do Not substitute Alcohol  to help with tapering If you have been on opioids for less than two weeks and do not have pain than it is ok to stop all together.  Plan to wean off of opioids This plan should start within one week post op of your joint replacement. Maintain the same interval or time between taking each dose and first decrease the dose.  Cut the total daily intake of opioids by one tablet each day Next start to increase the time between doses. The last dose that should be eliminated is the evening dose.   MAKE SURE YOU:  Understand these instructions.  Get help right away if you are not doing well or get worse.    Thank you for letting us  be a part of your medical care team.  It is a privilege we respect greatly.  We hope these instructions will help you stay on track for a fast and full recovery!            Signed: Bernarda CHRISTELLA Mclean 08/28/2024, 7:01 AM

## 2024-08-27 NOTE — Progress Notes (Signed)
 Patient not discharging today. Per MD, pt will stay one more night due to not feeling well and having GI symptoms.

## 2024-08-27 NOTE — Plan of Care (Signed)
  Problem: Education: Goal: Knowledge of General Education information will improve Description: Including pain rating scale, medication(s)/side effects and non-pharmacologic comfort measures Outcome: Progressing   Problem: Health Behavior/Discharge Planning: Goal: Ability to manage health-related needs will improve Outcome: Progressing   Problem: Clinical Measurements: Goal: Ability to maintain clinical measurements within normal limits will improve Outcome: Progressing Goal: Will remain free from infection Outcome: Progressing Goal: Diagnostic test results will improve Outcome: Progressing Goal: Respiratory complications will improve Outcome: Progressing Goal: Cardiovascular complication will be avoided Outcome: Progressing   Problem: Activity: Goal: Risk for activity intolerance will decrease Outcome: Progressing   Problem: Nutrition: Goal: Adequate nutrition will be maintained Outcome: Progressing   Problem: Coping: Goal: Level of anxiety will decrease Outcome: Progressing   Problem: Elimination: Goal: Will not experience complications related to bowel motility Outcome: Progressing Goal: Will not experience complications related to urinary retention Outcome: Progressing   Problem: Pain Managment: Goal: General experience of comfort will improve and/or be controlled Outcome: Progressing   Problem: Safety: Goal: Ability to remain free from injury will improve Outcome: Progressing   Problem: Skin Integrity: Goal: Risk for impaired skin integrity will decrease Outcome: Progressing   Problem: Coping: Goal: Ability to adjust to condition or change in health will improve Outcome: Progressing   Problem: Fluid Volume: Goal: Ability to maintain a balanced intake and output will improve Outcome: Progressing   Problem: Health Behavior/Discharge Planning: Goal: Ability to identify and utilize available resources and services will improve Outcome: Progressing Goal:  Ability to manage health-related needs will improve Outcome: Progressing   Problem: Nutritional: Goal: Maintenance of adequate nutrition will improve Outcome: Progressing Goal: Progress toward achieving an optimal weight will improve Outcome: Progressing   Problem: Skin Integrity: Goal: Risk for impaired skin integrity will decrease Outcome: Progressing   Problem: Education: Goal: Knowledge of the prescribed therapeutic regimen will improve Outcome: Progressing Goal: Individualized Educational Video(s) Outcome: Progressing   Problem: Activity: Goal: Ability to avoid complications of mobility impairment will improve Outcome: Progressing Goal: Range of joint motion will improve Outcome: Progressing

## 2024-08-28 DIAGNOSIS — M1711 Unilateral primary osteoarthritis, right knee: Secondary | ICD-10-CM | POA: Diagnosis not present

## 2024-08-28 LAB — GLUCOSE, CAPILLARY
Glucose-Capillary: 89 mg/dL (ref 70–99)
Glucose-Capillary: 154 mg/dL — ABNORMAL HIGH (ref 70–99)

## 2024-08-28 MED ORDER — MAGNESIUM CITRATE PO SOLN
1.0000 | Freq: Once | ORAL | Status: AC
  Administered 2024-08-28: 1 via ORAL
  Filled 2024-08-28: qty 296

## 2024-08-28 NOTE — Progress Notes (Signed)
 PROGRESS NOTE Richard [name]  FMW:[id number] DOB: [birth year] DOA: 08/24/2024 PCP: Rosan Dayton [name], DO  Brief Narrative/Hospital Course: Richard [name] is a 63 y.o. male with PMH of  type 2 diabetes mellitus, COPD, moderate aortic stenosis and mitral stenosis, hypertension, hyperlipidemia, history of peripheral arterial disease and amputation of left toes, iron  deficiency anemia and severe osteoarthritis of the right knee who was admitted and had a successful right knee replacement surgery.  TRH consulted for uncontrolled hyperglycemia.  Subjective: Seen and examined today Pain is controlled, resting comfortably Eager to go home today  Assessment and plan:  T2dm w/ uncontrolled hyperglycemia: Blood sugar remains well-controlled continue current regimen-metformin , Actos , Semglee  20 units daily, SSI 0-15 units, Jardiance  and weekly dulaglutide .  Resume home meds upon discharge along with diabetic diet Recent Labs  Lab 08/27/24 1116 08/27/24 1436 08/27/24 1634 08/27/24 2139 08/28/24 0719  GLUCAP 219* 184* 116* 188* 89    Hyperlipidemia: Continue statin  COPD: Not in exacerbation cont nebs  Moderate aortic stenosis Mitral stenosis: Follow-up outpatient  Hypertension: Well-controlled, continue meds prn.  PAD Amputation of left toes. Continue Plavix , aspirin  and  statin  IDA: Hemoglobin stable, monitor  Severe osteoarthritis of the right knee S/P replacement Continue pain management DVT prophylaxis and disposition as per primary orthopedic team currently on aspirin  81 twice daily.  Of note patient on home Plavix  Pain is controlled, he is eager to go home today after PT clearance  Anxiety disorder: Continue Klonopin , Celexa   Mobility: PT Orders: Active PT Follow up Rec: Follow Physician's Recommendations For Discharge Plan And Follow Up Therapies11/20/2025 1400   DVT prophylaxis: SCDs Start: 08/24/24 1654 Place TED hose Start: 08/24/24 1654 Code Status:   Code  Status: Full Code Family Communication: plan of care discussed with patient at bedside. Patient status is: Remains hospitalized because of severity of illness Level of care: Med-Surg   Dispo: The patient is from: home            Anticipated disposition: Noted plan for discharge home today once cleared by PT   Objective: Vitals last 24 hrs: Vitals:   08/27/24 2141 08/28/24 0614 08/28/24 0925 08/28/24 0926  BP: 137/64 121/70    Pulse: (!) 105 98    Resp: 18 18    Temp: 98.2 F (36.8 C) 98.5 F (36.9 C)    TempSrc: Oral Oral    SpO2: 96% 93% 95% 95%  Weight:      Height:        Physical Examination: General exam: Alert and awake HEENT:Oral mucosa moist, Ear/Nose WNL grossly Respiratory system: Bilaterally clear BS,no use of accessory muscle Cardiovascular system: S1 & S2 +, No JVD. Gastrointestinal system: Abdomen soft,NT,ND, BS+ Nervous System: Alert, awake, moving all extremities,and following commands. Extremities: extremities warm, leg edema neg, RT Knee  w/ brace/dressing+ Skin: Warm, no rashes MSK: Normal muscle bulk,tone, power   Medications reviewed:  Scheduled Meds:  arformoterol   15 mcg Nebulization BID   aspirin   81 mg Oral BID   atorvastatin   40 mg Oral Daily   clonazePAM   0.5 mg Oral QHS   clopidogrel   75 mg Oral Daily   docusate sodium   100 mg Oral BID   Dulaglutide   4.5 mg Injection Weekly   empagliflozin   10 mg Oral BID WC   And   metFORMIN   1,000 mg Oral BID WC   escitalopram   5 mg Oral Daily   insulin  aspart  0-15 Units Subcutaneous TID WC   insulin  glargine-yfgn  20 Units  Subcutaneous Daily   loratadine   10 mg Oral Daily   montelukast   10 mg Oral QHS   pantoprazole   40 mg Oral Daily   pioglitazone   30 mg Oral Daily   pregabalin   100 mg Oral TID   revefenacin   175 mcg Nebulization Daily   timolol   1 drop Both Eyes BID   Continuous Infusions:  lactated ringers  75 mL/hr at 08/28/24 0331   Diet: Diet Order             Diet - low sodium  heart healthy           Diet - low sodium heart healthy           Diet Carb Modified Fluid consistency: Thin; Room service appropriate? Yes  Diet effective now                    Data Reviewed: I have personally reviewed following labs and imaging studies ( see epic result tab) CBC: Recent Labs  Lab 08/25/24 0321 08/26/24 0314  WBC 10.8* 7.0  HGB 12.0* 10.9*  HCT 38.5* 34.8*  MCV 85.0 85.7  PLT 225 202   CMP: Recent Labs  Lab 08/25/24 0321 08/26/24 0314  NA 131* 134*  K 5.1 5.0  CL 97* 98  CO2 24 26  GLUCOSE 320* 215*  BUN 18 28*  CREATININE 0.89 1.08  CALCIUM  9.2 9.0   GFR: Estimated Creatinine Clearance: 89.1 mL/min (by C-G formula based on SCr of 1.08 mg/dL). No results for input(s): AST, ALT, ALKPHOS, BILITOT, PROT, ALBUMIN in the last 168 hours. No results for input(s): LIPASE, AMYLASE in the last 168 hours. No results for input(s): AMMONIA in the last 168 hours. Coagulation Profile: No results for input(s): INR, PROTIME in the last 168 hours. Unresulted Labs (From admission, onward)    None      Antimicrobials/Microbiology: Anti-infectives (From admission, onward)    Start     Dose/Rate Route Frequency Ordered Stop   08/24/24 2300  vancomycin  (VANCOCIN ) IVPB 1000 mg/200 mL premix        1,000 mg 200 mL/hr over 60 Minutes Intravenous  Once 08/24/24 1658 08/25/24 0742   08/24/24 2000  ceFAZolin  (ANCEF ) IVPB 2g/100 mL premix        2 g 200 mL/hr over 30 Minutes Intravenous Every 6 hours 08/24/24 1654 08/25/24 0740   08/24/24 1130  vancomycin  (VANCOREADY) IVPB 1500 mg/300 mL        1,500 mg 150 mL/hr over 120 Minutes Intravenous  Once 08/24/24 1009 08/24/24 1316   08/24/24 1015  ceFAZolin  (ANCEF ) IVPB 2g/100 mL premix        2 g 200 mL/hr over 30 Minutes Intravenous On call to O.R. 08/24/24 1009 08/24/24 1348         Component Value Date/Time   SDES BONE 09/18/2023 1755   SPECREQUEST LEFT FOOT 09/18/2023 1755   CULT   09/18/2023 1755    FEW ENTEROBACTER CLOACAE FEW PROPIONIBACTERIUM SPECIES Standardized susceptibility testing for this organism is not available. NO ANAEROBES ISOLATED; CULTURE IN PROGRESS FOR 5 DAYS    REPTSTATUS 09/24/2023 FINAL 09/18/2023 1755    Procedures: Procedure(s) (LRB): ARTHROPLASTY, KNEE, TOTAL (Right)   Mennie LAMY, MD [facility] 08/28/2024, 9:55 AM

## 2024-08-28 NOTE — Progress Notes (Signed)
    4 Days Post-Op Procedure(s) (LRB): ARTHROPLASTY, KNEE, TOTAL (Right)  Subjective:  Patient reports pain as moderate.  Still with some stiffness.  Discussed movements and post-operative expectations at length.  Blood sugar improved, hospitalist still following, had restarted Trulicity  yesterday.  Stayed overnight due to some abdominal discomfort yesterday, improved today.  Discharge home today pending PT clearance  Yesterday's total administered Morphine  Milligram Equivalents: 85   Objective:   VITALS:   Vitals:   08/27/24 0913 08/27/24 1428 08/27/24 2141 08/28/24 0614  BP:  (!) 152/71 137/64 121/70  Pulse:  96 (!) 105 98  Resp:  17 18 18   Temp:  98.7 F (37.1 C) 98.2 F (36.8 C) 98.5 F (36.9 C)  TempSrc:  Oral Oral Oral  SpO2: 98% 98% 96% 93%  Weight:      Height:        Resting in bed in NAD Vascularly intact Decreased sensation of bilateral feet at baseline, no new sensation changes distally  Intact pulses distally Dorsiflexion/Plantar flexion intact Incision: dressing C/D/I Compartment soft Wiggles toes appropriately    Lab Results  Component Value Date   WBC 7.0 08/26/2024   HGB 10.9 (L) 08/26/2024   HCT 34.8 (L) 08/26/2024   MCV 85.7 08/26/2024   PLT 202 08/26/2024   BMET    Component Value Date/Time   NA 134 (L) 08/26/2024 0314   NA 136 08/03/2024 0927   K 5.0 08/26/2024 0314   CL 98 08/26/2024 0314   CO2 26 08/26/2024 0314   GLUCOSE 215 (H) 08/26/2024 0314   BUN 28 (H) 08/26/2024 0314   BUN 20 08/03/2024 0927   CREATININE 1.08 08/26/2024 0314   CREATININE 1.07 01/31/2021 0957   CALCIUM  9.0 08/26/2024 0314   EGFR 83 08/03/2024 0927   GFRNONAA >60 08/26/2024 0314   GFRNONAA 75 01/31/2021 0957    Xray: Total knee arthroplasty components in good position no adverse features  Assessment/Plan: 4 Days Post-Op   Principal Problem:   Primary osteoarthritis of right knee Active Problems:   DM (diabetes mellitus), type 2 (HCC)  Status  post right knee arthroplasty done 08/24/2024  Hyperglycemia in the setting of type 2 diabetes: Hospitalist following, recs appreciated  Post op recs: WB: WBAT Abx: ancef +vanc Imaging: PACU xrays DVT prophylaxis: Aspirin  81mg  BID x4 weeks resume plavix  POD2 Follow up: 2 weeks after surgery for a wound check with Dr. Edna at [facility].  Address: [address], [place], [place] [address]  Office Phone: [phone]   Richard [name] 08/28/2024, 6:57 AM     Contact information:   Weekdays 7am-5pm epic message Dr. Edna, or call office for patient follow up: [phone] After hours and holidays please check [email or web address] for group call information for [facility]

## 2024-08-28 NOTE — Progress Notes (Signed)
 Physical Therapy Treatment Patient Details Name: Richard [name] MRN: [id number] DOB: 1961/06/08 Today's Date: 08/28/2024   History of Present Illness 63 yo male s/p R TKA on 08/24/24. 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  Educated also for fiance' assist on downside and for stopping to rest.  Reviewed HEP and encouraged using ice post exercise.  Patient dizzy in hallway initially after attempted BM in bathroom.  Stable for d/c home today.     If plan is discharge home, recommend the following: Assist for transportation;Help with stairs or ramp for entrance;Assistance with cooking/housework   Can travel by private vehicle        Equipment Recommendations  None recommended by PT    Recommendations for Other Services       Precautions / Restrictions Precautions Precautions: Knee;Fall Recall of Precautions/Restrictions: Intact Restrictions Other Position/Activity Restrictions: WBAT     Mobility  Bed Mobility               General bed mobility comments: in bathroom upon PT entry    Transfers Overall transfer level: Needs assistance Equipment used: Rolling walker (2 wheels) Transfers: Sit to/from Stand Sit to Stand: Contact guard assist, Supervision           General transfer comment: supervision from 3:1 over toilet, CGA From desk chair in hallway when light headed    Ambulation/Gait Ambulation/Gait assistance: Contact guard assist, Supervision Gait Distance (Feet): 50 Feet (& 40') Assistive device: Rolling walker (2 wheels) Gait Pattern/deviations: Step-to pattern, Step-through pattern, Trunk flexed, Decreased stride length, Antalgic       General Gait Details: flexed and reliant on RW for support, cues for upright posture   Stairs Stairs: Yes Stairs assistance: Contact guard assist Stair Management:  Two rails, Step to pattern, Forwards Number of Stairs: 10 General stair comments: practiced in stairwell to ensure able to access second floor bedroom at d/c.  Cues for sequencing, stopping to rest if needed, assist for safety and balance and educated to have fiance assist always on the down side   Wheelchair Mobility     Tilt Bed    Modified Rankin (Stroke Patients Only)       Balance Overall balance assessment: Needs assistance   Sitting balance-Leahy Scale: Good       Standing balance-Leahy Scale: Fair                              Hotel Manager: No apparent difficulties  Cognition Arousal: Alert Behavior During Therapy: WFL for tasks assessed/performed   PT - Cognitive impairments: No apparent impairments                         Following commands: Intact      Cueing Cueing Techniques: Verbal cues  Exercises Total Joint Exercises Ankle Circles/Pumps: AROM, Both, 10 reps Quad Sets: AROM, Both, Seated Short Arc Quad: AROM, Right, 5 reps, Seated Heel Slides: AAROM, Right, Seated Straight Leg Raises: AAROM, Right, 5 reps, Seated    General Comments General comments (skin integrity, edema, etc.): light headed walking in hallway after attempted BM in bathroom, so allowed seated rest VSS HR 96, SpO2 98% on RA, BP 124/65      Pertinent Vitals/Pain Pain Assessment Faces Pain Scale: Hurts  even more Pain Location: right knee Pain Descriptors / Indicators: Sore, Aching, Tightness, Grimacing, Guarding Pain Intervention(s): Monitored during session, Repositioned, Ice applied, Patient requesting pain meds-RN notified    Home Living                          Prior Function            PT Goals (current goals can now be found in the care plan section) Progress towards PT goals: Progressing toward goals    Frequency    7X/week      PT Plan      Co-evaluation              AM-PAC PT 6  Clicks Mobility   Outcome Measure  Help needed turning from your back to your side while in a flat bed without using bedrails?: A Little Help needed moving from lying on your back to sitting on the side of a flat bed without using bedrails?: A Little Help needed moving to and from a bed to a chair (including a wheelchair)?: A Little Help needed standing up from a chair using your arms (e.g., wheelchair or bedside chair)?: A Little Help needed to walk in hospital room?: A Little Help needed climbing 3-5 steps with a railing? : A Little 6 Click Score: 18    End of Session Equipment Utilized During Treatment: Gait belt Activity Tolerance: Patient tolerated treatment well Patient left: in chair;with call bell/phone within reach   PT Visit Diagnosis: Other abnormalities of gait and mobility (R26.89)     Time: 1110-1145 PT Time Calculation (min) (ACUTE ONLY): 35 min  Charges:    $Gait Training: 8-22 mins $Therapeutic Exercise: 8-22 mins PT General Charges $$ ACUTE PT VISIT: 1 Visit                     [name], PT Acute Rehabilitation Services [phone] 08/28/2024    Richard [name] 08/28/2024, 12:20 PM

## 2024-08-31 ENCOUNTER — Other Ambulatory Visit: Payer: Self-pay

## 2024-08-31 ENCOUNTER — Telehealth: Payer: Self-pay

## 2024-08-31 NOTE — Transitions of Care (Post Inpatient/ED Visit) (Signed)
 08/31/2024  Name: Richard [name] MRN: [id number] DOB: 01-19-1961  Today's TOC FU Call Status: Today's TOC FU Call Status:: Successful TOC FU Call Completed TOC FU Call Complete Date: 08/31/24  Patient's Name and Date of Birth confirmed. Name, DOB  Transition Care Management Follow-up Telephone Call Date of Discharge: 08/28/24 Discharge Facility: [facility]) Type of Discharge: Inpatient Admission Primary Inpatient Discharge Diagnosis:: Total Knee Arthroplasty How have you been since you were released from the hospital?: Better Any questions or concerns?: No  Items Reviewed: Did you receive and understand the discharge instructions provided?: Yes Medications obtained,verified, and reconciled?: Yes (Medications Reviewed) Any new allergies since your discharge?: No Dietary orders reviewed?: Yes Type of Diet Ordered:: Low Sodium Heart Healthy Do you have support at home?: Yes People in Home [RPT]: significant other Name of Support/Comfort Primary Source: Roxanne Higgs  Medications Reviewed Today: Medications Reviewed Today     Reviewed by Moises Reusing, RN (Case Manager) on 08/31/24 at 1005  Med List Status: <None>   Medication Order Taking? Sig Documenting Provider Last Dose Status Informant  Accu-Chek Softclix Lancets lancets [id number] No 1 each by Other route See admin instructions. 1-2 times daily [provider] Past Week Active Self, Pharmacy Records, Multiple Informants  acetaminophen  (TYLENOL ) 500 MG tablet [id number]  Take 2 tablets (1,000 mg total) by mouth every 8 (eight) hours as needed. Renae Hila M, PA-C  Active   albuterol  (VENTOLIN  HFA) 108 (90 Base) MCG/ACT inhaler [id number] No Inhale 1-2 puffs into the lungs every 4 (four) hours as needed for wheezing or shortness of breath. Amilibia, [name], DO 08/23/2024 Evening Active Self, Pharmacy Records, Multiple Informants  ascorbic acid (VITAMIN C) 500 MG tablet [id number] No Take 500 mg by mouth  daily. [provider] 08/17/2024 Active Self, Pharmacy Records, Multiple Informants  aspirin  EC 81 MG tablet [id number]  Take 1 tablet (81 mg total) by mouth 2 (two) times daily for 28 days. Swallow whole. Renae Hila HERO, PA-C  Active   atorvastatin  (LIPITOR) 40 MG tablet [id number] No Take 1 tablet (40 mg total) by mouth daily. Amilibia, [name], DO 08/23/2024 Active Self, Pharmacy Records, Multiple Informants  Budeson-Glycopyrrol-Formoterol  (BREZTRI  AEROSPHERE) 160-9-4.8 MCG/ACT AERO [id number] No Inhale 2 puffs into the lungs in the morning and at bedtime. Gladis Leonor HERO, MD 08/24/2024 Morning Active Self, Pharmacy Records, Multiple Informants  budesonide  (PULMICORT ) 0.5 MG/2ML nebulizer solution [id number] No Take 2 mLs (0.5 mg total) by nebulization every 12 (twelve) hours. Neda Jennet LABOR, MD 08/23/2024 Evening Active   cetirizine  (ZYRTEC  ALLERGY) 10 MG tablet [id number] No Take 1 tablet (10 mg total) by mouth daily.  Patient taking differently: Take 10 mg by mouth daily as needed for allergies.   Kandis Perkins, DO More than a month Active Self, Pharmacy Records, Multiple Informants  clobetasol  cream (TEMOVATE ) 0.05 % [id number] No Apply 1 Application topically 2 (two) times daily. Use for 2 weeks on, 2 weeks off. Repeat PRN.  Patient taking differently: Apply 1 Application topically 2 (two) times daily. Use for 2 weeks on, 2 weeks off. Repeat PRN. Alternate with triamcinolone .   Orman Erminio POUR, PA-C Past Month Active Self, Pharmacy Records, Multiple Informants  clonazePAM  (KLONOPIN ) 0.5 MG tablet [id number] No Take 1 tablet (0.5 mg total) by mouth at bedtime. Start with 0.5 mg nightly, can go up to 1 mg in about a week Olalere, Adewale A, MD More than a month Active   clopidogrel  (PLAVIX ) 75 MG tablet [id number]  Take 1 tablet (75 mg total)  by mouth daily. Renae Bernarda HERO, PA-C  Active   Continuous Glucose Sensor (FREESTYLE LIBRE 3 PLUS SENSOR) [place] [id number]  Place 1 sensor  on the skin every 15 days. Use to check glucose continuously. Rosan Dayton [name], DO  Active Self, Pharmacy Records, Multiple Informants           Med Note [facility], DAMIEN HERO   Mon Aug 24, 2024 11:21 AM) Removed PTA  diphenhydrAMINE  (BENADRYL  ALLERGY) 25 mg capsule [id number] No Take 1 capsule (25 mg total) by mouth at bedtime as needed for allergies or itching. Kandis Damien, DO More than a month Active Self, Pharmacy Records, Multiple Informants  Dulaglutide  (TRULICITY ) 4.5 MG/0.5ML SOAJ [id number]  Inject 4.5 mg as directed once a week. Fridays -- WAIT 2 WEEKS AFTER SURGERY BEFORE RESTARTING Cockerham, Alicia M, PA-C  Active   DUPIXENT 300 MG/2ML EMMANUEL [id number] No Inject 300 mg into the skin every 14 (fourteen) days. [provider] More than a month Active Self, Pharmacy Records, Multiple Informants  escitalopram  (LEXAPRO ) 5 MG tablet [id number] No Take 1 tablet (5 mg total) by mouth daily. Rosan Dayton [name], DO 08/17/2024 Active Self, Pharmacy Records, Multiple Informants  fluticasone  (FLONASE ) 50 MCG/ACT nasal spray [id number] No Place 1 spray into both nostrils as needed for allergies. Rosan Dayton [name], DO More than a month Active Self, Pharmacy Records, Multiple Informants  glucose blood test strip [id number] No 1 each by Other route in the morning and at bedtime. [provider] Past Week Active Self, Pharmacy Records, Multiple Informants  insulin  degludec (TRESIBA ) 100 UNIT/ML FlexTouch Pen [id number] No Inject 20 Units into the skin daily. Rosan Dayton [name], DO 08/22/2024 Active Self, Pharmacy Records, Multiple Informants           Med Note NESTOR DAMIEN HERO   Mon Aug 24, 2024 11:22 AM) 10u Sat 11/15  Insulin  Pen Needle (PEN NEEDLES) 32G X 4 MM MISC [id number] No Use 1 each daily as directed. Rosan Dayton [name], DO Past Week Active Self, Pharmacy Records, Multiple Informants  ipratropium-albuterol  (DUONEB) 0.5-2.5 (3) MG/3ML SOLN [id number] No Use 1 vial (3 mLs) by nebulization every 6 (six)  hours as needed. Rosan Dayton [name], DO 08/23/2024 Evening Active Self, Pharmacy Records, Multiple Informants  meloxicam  (MOBIC ) 15 MG tablet [id number]  Take 1 tablet (15 mg total) by mouth daily. Renae Bernarda HERO, PA-C  Active   methocarbamol  (ROBAXIN ) 500 MG tablet [id number]  Take 1 tablet (500 mg total) by mouth every 8 (eight) hours as needed for up to 10 days for muscle spasms. Renae Bernarda HERO, PA-C  Active   montelukast  (SINGULAIR ) 10 MG tablet [id number] No Take 1 tablet (10 mg total) by mouth at bedtime. Rosan Dayton [name], DO 08/23/2024 Bedtime Active Self, Pharmacy Records, Multiple Informants  Multiple Vitamin (MULTIVITAMIN) tablet [id number] No Take 1 tablet by mouth daily. [provider] 08/17/2024 Active Self, Pharmacy Records, Multiple Informants  omeprazole  (PRILOSEC) 40 MG capsule [id number]  Take 1 capsule (40 mg total) by mouth daily for 21 days. Renae Bernarda HERO, PA-C  Active   ondansetron  (ZOFRAN ) 4 MG tablet [id number]  Take 1 tablet (4 mg total) by mouth every 8 (eight) hours as needed for up to 14 days for nausea or vomiting. Renae Bernarda HERO, PA-C  Active   oxyCODONE  (ROXICODONE ) 5 MG immediate release tablet [id number]  Take 1 tablet (5 mg total) by mouth every 4 (four) hours as needed for up to 7 days for severe pain (pain score 7-10) or moderate pain (  pain score 4-6). Renae Bernarda HERO, PA-C  Active   pioglitazone  (ACTOS ) 30 MG tablet [id number] No Take 30 mg by mouth daily. [provider] Past Month Active Self, Pharmacy Records, Multiple Informants  polyethylene glycol (MIRALAX ) 17 g packet [id number]  Take 17 g by mouth daily. Renae Bernarda HERO, PA-C  Active   pregabalin  (LYRICA ) 100 MG capsule [id number] No Take 1 capsule (100 mg total) by mouth 3 (three) times daily. Rosan Dayton [name], DO 08/24/2024  7:30 AM Active Self, Pharmacy Records, Multiple Informants  SYNJARDY  12.02-999 MG TABS [id number] No Take 1 tablet by mouth 2 (two) times daily. Amilibia,  [name], DO 08/24/2024  7:30 AM Active Self, Pharmacy Records, Multiple Informants  tadalafil  (CIALIS ) 10 MG tablet [id number]  Take 1 tablet (10 mg total) by mouth as needed for erectile dysfunction. WAIT THREE DAYS FROM SURGERY BEFORE RESTARTING Renae Bernarda HERO, PA-C  Active   timolol  (TIMOPTIC ) 0.5 % ophthalmic solution [id number] No Place 1 drop into both eyes 2 (two) times daily. [provider] 08/24/2024  7:30 AM Active Self, Pharmacy Records, Multiple Informants  triamcinolone  cream (KENALOG ) 0.1 % [id number] No Apply 1 Application topically 2 (two) times daily. Use for 2 weeks on, 2 weeks off. Repeat PRN.  Patient taking differently: Apply 1 Application topically 2 (two) times daily. Use for 2 weeks on, 2 weeks off. Repeat PRN. Alternate with clobetasol .   Orman Erminio POUR, PA-C Past Month Active Self, Pharmacy Records, Multiple Informants            Home Care and Equipment/Supplies: Were Home Health Services Ordered?: NA (The patient is going OPPT) Any new equipment or medical supplies ordered?: Yes Name of Medical supply agency?: [employer] Were you able to get the equipment/medical supplies?: Yes Do you have any questions related to the use of the equipment/supplies?: No  Functional Questionnaire: Do you need assistance with bathing/showering or dressing?: No Do you need assistance with meal preparation?: No Do you need assistance with eating?: No Do you have difficulty maintaining continence: No Do you need assistance with getting out of bed/getting out of a chair/moving?: No Do you have difficulty managing or taking your medications?: No  Follow up appointments reviewed: PCP Follow-up appointment confirmed?: Yes Date of PCP follow-up appointment?: 09/21/24 Follow-up Provider: Dayton Rosan Specialist [facility] Follow-up appointment confirmed?: Yes Date of Specialist follow-up appointment?: 09/08/24 Follow-Up Specialty Provider:: Dr. Edna Do you need  transportation to your follow-up appointment?: No Do you understand care options if your condition(s) worsen?: Yes-patient verbalized understanding  SDOH Interventions Today    Flowsheet Row Most Recent Value  SDOH Interventions   Food Insecurity Interventions Intervention Not Indicated  Housing Interventions Intervention Not Indicated  Transportation Interventions Intervention Not Indicated  Utilities Interventions Intervention Not Indicated    Medford Balboa, BSN, RN [place]  [facility] - [facility] RN Care Manager [phone]

## 2024-09-21 ENCOUNTER — Inpatient Hospital Stay: Payer: Self-pay | Admitting: Internal Medicine

## 2024-09-21 ENCOUNTER — Encounter: Admitting: Gastroenterology

## 2024-09-23 ENCOUNTER — Ambulatory Visit: Admitting: Pulmonary Disease

## 2024-09-23 ENCOUNTER — Encounter: Payer: Self-pay | Admitting: Pulmonary Disease

## 2024-09-24 ENCOUNTER — Other Ambulatory Visit (HOSPITAL_COMMUNITY): Payer: Self-pay

## 2024-09-24 ENCOUNTER — Other Ambulatory Visit: Payer: Self-pay

## 2024-09-24 ENCOUNTER — Other Ambulatory Visit: Payer: Self-pay | Admitting: Internal Medicine

## 2024-09-24 DIAGNOSIS — E1140 Type 2 diabetes mellitus with diabetic neuropathy, unspecified: Secondary | ICD-10-CM

## 2024-09-24 MED ORDER — PREGABALIN 100 MG PO CAPS
100.0000 mg | ORAL_CAPSULE | Freq: Three times a day (TID) | ORAL | 5 refills | Status: AC
  Filled 2024-09-24: qty 90, 30d supply, fill #0

## 2024-09-25 ENCOUNTER — Other Ambulatory Visit: Payer: Self-pay

## 2024-09-28 ENCOUNTER — Ambulatory Visit: Admitting: Gastroenterology

## 2024-09-28 ENCOUNTER — Encounter: Payer: Self-pay | Admitting: Gastroenterology

## 2024-09-28 VITALS — BP 142/75 | HR 86 | Temp 98.8°F | Resp 15 | Ht 75.0 in | Wt 228.0 lb

## 2024-09-28 DIAGNOSIS — K449 Diaphragmatic hernia without obstruction or gangrene: Secondary | ICD-10-CM | POA: Diagnosis not present

## 2024-09-28 DIAGNOSIS — K635 Polyp of colon: Secondary | ICD-10-CM | POA: Diagnosis not present

## 2024-09-28 DIAGNOSIS — K644 Residual hemorrhoidal skin tags: Secondary | ICD-10-CM

## 2024-09-28 DIAGNOSIS — D509 Iron deficiency anemia, unspecified: Secondary | ICD-10-CM | POA: Diagnosis not present

## 2024-09-28 DIAGNOSIS — K31811 Angiodysplasia of stomach and duodenum with bleeding: Secondary | ICD-10-CM | POA: Diagnosis not present

## 2024-09-28 DIAGNOSIS — D120 Benign neoplasm of cecum: Secondary | ICD-10-CM

## 2024-09-28 DIAGNOSIS — K648 Other hemorrhoids: Secondary | ICD-10-CM

## 2024-09-28 DIAGNOSIS — K31819 Angiodysplasia of stomach and duodenum without bleeding: Secondary | ICD-10-CM

## 2024-09-28 DIAGNOSIS — K6389 Other specified diseases of intestine: Secondary | ICD-10-CM | POA: Diagnosis not present

## 2024-09-28 DIAGNOSIS — Z860100 Personal history of colon polyps, unspecified: Secondary | ICD-10-CM

## 2024-09-28 MED ORDER — SODIUM CHLORIDE 0.9 % IV SOLN: 500.0000 mL | INTRAVENOUS | Status: DC

## 2024-09-28 MED ORDER — DEXTROSE 5 % IV SOLN: INTRAVENOUS | Status: AC

## 2024-09-28 NOTE — Progress Notes (Signed)
 Transferred to PACU via stretcher.  Not responding to stimulation at this time.  VSS upon leaving procedure room.

## 2024-09-28 NOTE — Op Note (Signed)
 [facility] Patient Name: Richard [name] Procedure Date: 09/28/2024 8:19 AM MRN: [id number] Endoscopist: Gustav ALONSO Mcgee , MD, [id number] Age: 63 Referring MD:  Date of Birth: September 14, 1961 Gender: Male Account #: [id number] Procedure:                Colonoscopy Indications:              Unexplained iron  deficiency anemia Medicines:                Monitored Anesthesia Care Procedure:                Pre-Anesthesia Assessment:                           - Prior to the procedure, a History and Physical                            was performed, and patient medications and                            allergies were reviewed. The patient's tolerance of                            previous anesthesia was also reviewed. The risks                            and benefits of the procedure and the sedation                            options and risks were discussed with the patient.                            All questions were answered, and informed consent                            was obtained. Prior Anticoagulants: The patient                            last took Plavix  (clopidogrel ) 5 days prior to the                            procedure. ASA Grade Assessment: III - A patient                            with severe systemic disease. After reviewing the                            risks and benefits, the patient was deemed in                            satisfactory condition to undergo the procedure.                           After obtaining informed consent, the colonoscope  was passed under direct vision. Throughout the                            procedure, the patient's blood pressure, pulse, and                            oxygen  saturations were monitored continuously. The                            Olympus Scope [id number] was introduced through the                            anus and advanced to the the cecum, identified by                             appendiceal orifice and ileocecal valve. The                            colonoscopy was performed without difficulty. The                            patient tolerated the procedure well. The quality                            of the bowel preparation was good. The ileocecal                            valve, appendiceal orifice, and rectum were                            photographed. Scope In: 8:46:53 AM Scope Out: 9:02:08 AM Scope Withdrawal Time: 0 hours 8 minutes 31 seconds  Total Procedure Duration: 0 hours 15 minutes 15 seconds  Findings:                 The perianal and digital rectal examinations were                            normal.                           A less than 1 mm polyp was found in the cecum. The                            polyp was sessile. The polyp was removed with a                            cold biopsy forceps. Resection and retrieval were                            complete.                           Non-bleeding external and internal hemorrhoids were  found during retroflexion. The hemorrhoids were                            medium-sized.                           The exam was otherwise without abnormality. Complications:            No immediate complications. Estimated Blood Loss:     Estimated blood loss was minimal. Impression:               - One less than 1 mm polyp in the cecum, removed                            with a cold biopsy forceps. Resected and retrieved.                           - Non-bleeding external and internal hemorrhoids.                           - The examination was otherwise normal. Recommendation:           - Resume previous diet.                           - Continue present medications.                           - Await pathology results.                           - Repeat colonoscopy in 7-10 years for surveillance                            based on pathology results.                           -  Resume Plavix  (clopidogrel ) at prior dose in 2                            days. Refer to managing physician for further                            adjustment of therapy. Jakson Delpilar V. Hyman Crossan, MD 09/28/2024 9:10:22 AM This report has been signed electronically.
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
## 2024-09-28 NOTE — Progress Notes (Signed)
 Called to room to assist during endoscopic procedure.  Patient ID and intended procedure confirmed with present staff. Received instructions for my participation in the procedure from the performing physician.

## 2024-09-28 NOTE — Progress Notes (Signed)
 Pt's states no medical or surgical changes since previsit or office visit.

## 2024-09-28 NOTE — Op Note (Signed)
 [facility] Patient Name: Richard [name] Procedure Date: 09/28/2024 8:20 AM MRN: [id number] Endoscopist: Gustav ALONSO Mcgee , MD, [id number] Age: 63 Referring MD:  Date of Birth: [birth year] Gender: Male Account #: [id number] Procedure:                Upper GI endoscopy Indications:              Suspected upper gastrointestinal bleeding in                            patient with unexplained iron  deficiency anemia Medicines:                Monitored Anesthesia Care Procedure:                Pre-Anesthesia Assessment:                           - Prior to the procedure, a History and Physical                            was performed, and patient medications and                            allergies were reviewed. The patient's tolerance of                            previous anesthesia was also reviewed. The risks                            and benefits of the procedure and the sedation                            options and risks were discussed with the patient.                            All questions were answered, and informed consent                            was obtained. Prior Anticoagulants: The patient                            last took Plavix  (clopidogrel ) 5 days prior to the                            procedure. ASA Grade Assessment: III - A patient                            with severe systemic disease. After reviewing the                            risks and benefits, the patient was deemed in                            satisfactory condition to undergo the procedure.  After obtaining informed consent, the endoscope was                            passed under direct vision. Throughout the                            procedure, the patient's blood pressure, pulse, and                            oxygen  saturations were monitored continuously. The                            GIF [id number] #[id number] was introduced through the                             mouth, and advanced to the second part of duodenum.                            The upper GI endoscopy was accomplished without                            difficulty. The patient tolerated the procedure                            well. Scope In: Scope Out: Findings:                 The Z-line was regular and was found 40 cm from the                            incisors.                           A 2 cm hiatal hernia was present.                           Two less than 1 mm angioectasias with no bleeding                            were found in the gastric body.                           The cardia and gastric fundus were normal on                            retroflexion.                           A single 6 mm angioectasia with bleeding was found                            in the duodenal bulb. To stop active bleeding, two                            hemostatic clips were successfully placed. There  was no bleeding at the end of the procedure.                           The second portion of the duodenum was normal. Complications:            No immediate complications. Estimated Blood Loss:     Estimated blood loss was minimal. Impression:               - Z-line regular, 40 cm from the incisors.                           - 2 cm hiatal hernia.                           - Two non-bleeding angioectasias in the stomach.                           - A single bleeding angioectasia in the duodenum.                            Clips were placed.                           - Normal second portion of the duodenum.                           - No specimens collected. Recommendation:           - Resume previous diet.                           - Continue present medications.                           - No ibuprofen , naproxen , or other non-steroidal                            anti-inflammatory drugs.                           - Continue Iron  supplements BID as tolerated                            - Monitor CBC and Iron  panel routinely every 3                            months                           - If continues to have persistent anemia will                            consider small bowel video capsule study                           - Follow up in GI office with Dr.Alyah Boehning or APP in  2-3 months Gustav ALONSO Mcgee, MD 09/28/2024 9:16:04 AM This report has been signed electronically.

## 2024-09-28 NOTE — Patient Instructions (Addendum)
 Resume previous diet.                           - Continue present medications.                           - No ibuprofen , naproxen , or other non-steroidal                            anti-inflammatory drugs.                           - Continue Iron  supplements BID as tolerated                           - Monitor CBC and Iron  panel routinely every 3                            months                           - If continues to have persistent anemia will                            consider small bowel video capsule study                           - Follow up in GI office with Dr.Nandigam or APP in                            2-3 months Repeat colonoscopy in 7-10 years for surveillance                            based on pathology results.                           - Resume Plavix  (clopidogrel ) at prior dose in 2                            days. Refer to managing physician for further                            adjustment of therapy.     YOU HAD AN ENDOSCOPIC PROCEDURE TODAY AT THE [facility]:   Refer to the procedure report that was given to you for any specific questions about what was found during the examination.  If the procedure report does not answer your questions, please call your gastroenterologist to clarify.  If you requested that your care partner not be given the details of your procedure findings, then the procedure report has been included in a sealed envelope for you to review at your convenience later.  YOU SHOULD EXPECT: Some feelings of bloating in the abdomen. Passage of more gas than usual.  Walking can help get rid of the air that was put into your GI tract during the procedure and reduce the bloating. If you had a lower endoscopy (such as a  colonoscopy or flexible sigmoidoscopy) you may notice spotting of blood in your stool or on the toilet paper. If you underwent a bowel prep for your procedure, you may not have a normal bowel movement for a few  days.  Please Note:  You might notice some irritation and congestion in your nose or some drainage.  This is from the oxygen  used during your procedure.  There is no need for concern and it should clear up in a day or so.  SYMPTOMS TO REPORT IMMEDIATELY:  Following lower endoscopy (colonoscopy or flexible sigmoidoscopy):  Excessive amounts of blood in the stool  Significant tenderness or worsening of abdominal pains  Swelling of the abdomen that is new, acute  Fever of 100F or higher  Following upper endoscopy (EGD)  Vomiting of blood or coffee ground material  New chest pain or pain under the shoulder blades  Painful or persistently difficult swallowing  New shortness of breath  Fever of 100F or higher  Black, tarry-looking stools  For urgent or emergent issues, a gastroenterologist can be reached at any hour by calling [phone]. Do not use MyChart messaging for urgent concerns.    DIET:  We do recommend a small meal at first, but then you may proceed to your regular diet.  Drink plenty of fluids but you should avoid alcoholic beverages for 24 hours.  ACTIVITY:  You should plan to take it easy for the rest of today and you should NOT DRIVE or use heavy machinery until tomorrow (because of the sedation medicines used during the test).    FOLLOW UP: Our staff will call the number listed on your records the next business day following your procedure.  We will call around 7:15- 8:00 am to check on you and address any questions or concerns that you may have regarding the information given to you following your procedure. If we do not reach you, we will leave a message.     If any biopsies were taken you will be contacted by phone or by letter within the next 1-3 weeks.  Please call us  at [phone] if you have not heard about the biopsies in 3 weeks.    SIGNATURES/CONFIDENTIALITY: You and/or your care partner have signed paperwork which will be entered into your electronic  medical record.  These signatures attest to the fact that that the information above on your After Visit Summary has been reviewed and is understood.  Full responsibility of the confidentiality of this discharge information lies with you and/or your care-partner.

## 2024-09-29 ENCOUNTER — Telehealth: Payer: Self-pay

## 2024-09-29 NOTE — Telephone Encounter (Signed)
 Post procedure follow up call, no answer

## 2024-10-05 ENCOUNTER — Other Ambulatory Visit: Payer: Self-pay

## 2024-10-05 DIAGNOSIS — J4489 Other specified chronic obstructive pulmonary disease: Secondary | ICD-10-CM

## 2024-10-06 ENCOUNTER — Other Ambulatory Visit: Payer: Self-pay | Admitting: Internal Medicine

## 2024-10-06 ENCOUNTER — Other Ambulatory Visit: Payer: Self-pay

## 2024-10-06 ENCOUNTER — Other Ambulatory Visit (HOSPITAL_COMMUNITY): Payer: Self-pay

## 2024-10-06 DIAGNOSIS — J4489 Other specified chronic obstructive pulmonary disease: Secondary | ICD-10-CM

## 2024-10-06 LAB — SURGICAL PATHOLOGY

## 2024-10-06 MED ORDER — ALBUTEROL SULFATE HFA 108 (90 BASE) MCG/ACT IN AERS: 1.0000 | INHALATION_SPRAY | RESPIRATORY_TRACT | 5 refills | Status: DC | PRN

## 2024-10-06 NOTE — Telephone Encounter (Signed)
 Copied from CRM #[id number]. Topic: Clinical - Medication Refill >> Oct 06, 2024  9:13 AM Diannia H wrote: Medication: albuterol  (VENTOLIN  HFA) 108 (90 Base) MCG/ACT inhaler  Has the patient contacted their pharmacy? Yes (Agent: If no, request that the patient contact the pharmacy for the refill. If patient does not wish to contact the pharmacy document the reason why and proceed with request.) (Agent: If yes, when and what did the pharmacy advise?)  This is the patient's preferred pharmacy:  [facility] [address] [place] [address] Phone: [phone] Fax: [phone]  Is this the correct pharmacy for this prescription? Yes If no, delete pharmacy and type the correct one.   Has the prescription been filled recently? Yes  Is the patient out of the medication? Yes  Has the patient been seen for an appointment in the last year OR does the patient have an upcoming appointment? Yes  Can we respond through MyChart? Yes  Agent: Please be advised that Rx refills may take up to 3 business days. We ask that you follow-up with your pharmacy.

## 2024-10-12 ENCOUNTER — Other Ambulatory Visit: Payer: Self-pay

## 2024-10-14 ENCOUNTER — Ambulatory Visit (HOSPITAL_COMMUNITY)
Admission: RE | Admit: 2024-10-14 | Payer: Self-pay | Attending: Cardiovascular Disease | Admitting: Cardiovascular Disease

## 2024-10-14 ENCOUNTER — Other Ambulatory Visit: Payer: Self-pay

## 2024-10-14 ENCOUNTER — Telehealth: Payer: Self-pay | Admitting: Cardiovascular Disease

## 2024-10-14 DIAGNOSIS — I050 Rheumatic mitral stenosis: Secondary | ICD-10-CM

## 2024-10-14 NOTE — Telephone Encounter (Signed)
 Patient wants Echocardiogram orders re-instated so he can schedule test.

## 2024-10-14 NOTE — Telephone Encounter (Signed)
 Spoke with patient regarding rescheduling echo. Pt states he rescheduled the echo already, but is not confirmed. Spoke with Olam Ford and she rescheduled patient. No further needs at this time. Pt verbalized understanding of plan.

## 2024-10-15 ENCOUNTER — Encounter: Payer: Self-pay | Admitting: Cardiovascular Disease

## 2024-10-16 ENCOUNTER — Ambulatory Visit: Admitting: Podiatry

## 2024-10-21 ENCOUNTER — Ambulatory Visit

## 2024-10-22 ENCOUNTER — Emergency Department (HOSPITAL_COMMUNITY)

## 2024-10-22 ENCOUNTER — Other Ambulatory Visit: Payer: Self-pay

## 2024-10-22 ENCOUNTER — Inpatient Hospital Stay (HOSPITAL_COMMUNITY)
Admission: EM | Admit: 2024-10-22 | Discharge: 2024-10-28 | DRG: 378 | Disposition: A | Discharge status: Home / Self Care | Attending: Internal Medicine | Admitting: Internal Medicine

## 2024-10-22 DIAGNOSIS — K5521 Angiodysplasia of colon with hemorrhage: Secondary | ICD-10-CM | POA: Diagnosis present

## 2024-10-22 DIAGNOSIS — Z87891 Personal history of nicotine dependence: Secondary | ICD-10-CM

## 2024-10-22 DIAGNOSIS — R7989 Other specified abnormal findings of blood chemistry: Secondary | ICD-10-CM | POA: Diagnosis not present

## 2024-10-22 DIAGNOSIS — I350 Nonrheumatic aortic (valve) stenosis: Secondary | ICD-10-CM | POA: Diagnosis not present

## 2024-10-22 DIAGNOSIS — R0789 Other chest pain: Secondary | ICD-10-CM | POA: Diagnosis not present

## 2024-10-22 DIAGNOSIS — R0902 Hypoxemia: Secondary | ICD-10-CM | POA: Diagnosis present

## 2024-10-22 DIAGNOSIS — I083 Combined rheumatic disorders of mitral, aortic and tricuspid valves: Secondary | ICD-10-CM | POA: Diagnosis present

## 2024-10-22 DIAGNOSIS — Z791 Long term (current) use of non-steroidal anti-inflammatories (NSAID): Secondary | ICD-10-CM

## 2024-10-22 DIAGNOSIS — D509 Iron deficiency anemia, unspecified: Secondary | ICD-10-CM | POA: Diagnosis present

## 2024-10-22 DIAGNOSIS — J4489 Other specified chronic obstructive pulmonary disease: Secondary | ICD-10-CM | POA: Diagnosis present

## 2024-10-22 DIAGNOSIS — I2510 Atherosclerotic heart disease of native coronary artery without angina pectoris: Secondary | ICD-10-CM | POA: Diagnosis present

## 2024-10-22 DIAGNOSIS — J441 Chronic obstructive pulmonary disease with (acute) exacerbation: Secondary | ICD-10-CM | POA: Diagnosis present

## 2024-10-22 DIAGNOSIS — I700 Atherosclerosis of aorta: Secondary | ICD-10-CM | POA: Diagnosis present

## 2024-10-22 DIAGNOSIS — E1142 Type 2 diabetes mellitus with diabetic polyneuropathy: Secondary | ICD-10-CM | POA: Diagnosis present

## 2024-10-22 DIAGNOSIS — K449 Diaphragmatic hernia without obstruction or gangrene: Secondary | ICD-10-CM | POA: Diagnosis present

## 2024-10-22 DIAGNOSIS — J4551 Severe persistent asthma with (acute) exacerbation: Principal | ICD-10-CM | POA: Diagnosis present

## 2024-10-22 DIAGNOSIS — E1151 Type 2 diabetes mellitus with diabetic peripheral angiopathy without gangrene: Secondary | ICD-10-CM | POA: Diagnosis present

## 2024-10-22 DIAGNOSIS — E1140 Type 2 diabetes mellitus with diabetic neuropathy, unspecified: Secondary | ICD-10-CM | POA: Diagnosis present

## 2024-10-22 DIAGNOSIS — E871 Hypo-osmolality and hyponatremia: Secondary | ICD-10-CM | POA: Diagnosis present

## 2024-10-22 DIAGNOSIS — K5900 Constipation, unspecified: Secondary | ICD-10-CM | POA: Diagnosis present

## 2024-10-22 DIAGNOSIS — Z7951 Long term (current) use of inhaled steroids: Secondary | ICD-10-CM

## 2024-10-22 DIAGNOSIS — K3189 Other diseases of stomach and duodenum: Secondary | ICD-10-CM | POA: Diagnosis not present

## 2024-10-22 DIAGNOSIS — E119 Type 2 diabetes mellitus without complications: Secondary | ICD-10-CM

## 2024-10-22 DIAGNOSIS — D6804 Acquired von Willebrand disease: Secondary | ICD-10-CM | POA: Diagnosis present

## 2024-10-22 DIAGNOSIS — J069 Acute upper respiratory infection, unspecified: Secondary | ICD-10-CM | POA: Diagnosis present

## 2024-10-22 DIAGNOSIS — D62 Acute posthemorrhagic anemia: Secondary | ICD-10-CM | POA: Diagnosis present

## 2024-10-22 DIAGNOSIS — M1711 Unilateral primary osteoarthritis, right knee: Secondary | ICD-10-CM | POA: Diagnosis not present

## 2024-10-22 DIAGNOSIS — K31811 Angiodysplasia of stomach and duodenum with bleeding: Principal | ICD-10-CM | POA: Diagnosis present

## 2024-10-22 DIAGNOSIS — Z91013 Allergy to seafood: Secondary | ICD-10-CM

## 2024-10-22 DIAGNOSIS — K31819 Angiodysplasia of stomach and duodenum without bleeding: Secondary | ICD-10-CM

## 2024-10-22 DIAGNOSIS — Z7985 Long-term (current) use of injectable non-insulin antidiabetic drugs: Secondary | ICD-10-CM

## 2024-10-22 DIAGNOSIS — J45901 Unspecified asthma with (acute) exacerbation: Secondary | ICD-10-CM | POA: Diagnosis present

## 2024-10-22 DIAGNOSIS — D72819 Decreased white blood cell count, unspecified: Secondary | ICD-10-CM | POA: Diagnosis present

## 2024-10-22 DIAGNOSIS — M1990 Unspecified osteoarthritis, unspecified site: Secondary | ICD-10-CM | POA: Diagnosis present

## 2024-10-22 DIAGNOSIS — E875 Hyperkalemia: Secondary | ICD-10-CM | POA: Diagnosis present

## 2024-10-22 DIAGNOSIS — E1165 Type 2 diabetes mellitus with hyperglycemia: Secondary | ICD-10-CM | POA: Diagnosis present

## 2024-10-22 DIAGNOSIS — R0602 Shortness of breath: Secondary | ICD-10-CM | POA: Diagnosis present

## 2024-10-22 DIAGNOSIS — R944 Abnormal results of kidney function studies: Secondary | ICD-10-CM | POA: Diagnosis present

## 2024-10-22 DIAGNOSIS — I998 Other disorder of circulatory system: Secondary | ICD-10-CM | POA: Diagnosis present

## 2024-10-22 DIAGNOSIS — Z8249 Family history of ischemic heart disease and other diseases of the circulatory system: Secondary | ICD-10-CM

## 2024-10-22 DIAGNOSIS — Z1152 Encounter for screening for COVID-19: Secondary | ICD-10-CM

## 2024-10-22 DIAGNOSIS — Z89422 Acquired absence of other left toe(s): Secondary | ICD-10-CM

## 2024-10-22 DIAGNOSIS — Z7984 Long term (current) use of oral hypoglycemic drugs: Secondary | ICD-10-CM

## 2024-10-22 DIAGNOSIS — Z7902 Long term (current) use of antithrombotics/antiplatelets: Secondary | ICD-10-CM | POA: Diagnosis not present

## 2024-10-22 DIAGNOSIS — Z8614 Personal history of Methicillin resistant Staphylococcus aureus infection: Secondary | ICD-10-CM

## 2024-10-22 DIAGNOSIS — Z83511 Family history of glaucoma: Secondary | ICD-10-CM

## 2024-10-22 DIAGNOSIS — Z79899 Other long term (current) drug therapy: Secondary | ICD-10-CM

## 2024-10-22 DIAGNOSIS — H409 Unspecified glaucoma: Secondary | ICD-10-CM | POA: Diagnosis present

## 2024-10-22 DIAGNOSIS — D649 Anemia, unspecified: Secondary | ICD-10-CM | POA: Diagnosis not present

## 2024-10-22 DIAGNOSIS — Z96651 Presence of right artificial knee joint: Secondary | ICD-10-CM | POA: Diagnosis present

## 2024-10-22 DIAGNOSIS — F32A Depression, unspecified: Secondary | ICD-10-CM | POA: Diagnosis present

## 2024-10-22 DIAGNOSIS — Z794 Long term (current) use of insulin: Secondary | ICD-10-CM

## 2024-10-22 DIAGNOSIS — K000 Anodontia: Secondary | ICD-10-CM | POA: Diagnosis present

## 2024-10-22 DIAGNOSIS — I371 Nonrheumatic pulmonary valve insufficiency: Secondary | ICD-10-CM | POA: Diagnosis present

## 2024-10-22 DIAGNOSIS — E785 Hyperlipidemia, unspecified: Secondary | ICD-10-CM | POA: Diagnosis present

## 2024-10-22 DIAGNOSIS — J449 Chronic obstructive pulmonary disease, unspecified: Secondary | ICD-10-CM | POA: Diagnosis not present

## 2024-10-22 DIAGNOSIS — I050 Rheumatic mitral stenosis: Secondary | ICD-10-CM | POA: Diagnosis not present

## 2024-10-22 DIAGNOSIS — I10 Essential (primary) hypertension: Secondary | ICD-10-CM | POA: Diagnosis present

## 2024-10-22 DIAGNOSIS — D500 Iron deficiency anemia secondary to blood loss (chronic): Secondary | ICD-10-CM | POA: Diagnosis not present

## 2024-10-22 DIAGNOSIS — L309 Dermatitis, unspecified: Secondary | ICD-10-CM | POA: Diagnosis present

## 2024-10-22 DIAGNOSIS — E878 Other disorders of electrolyte and fluid balance, not elsewhere classified: Secondary | ICD-10-CM | POA: Insufficient documentation

## 2024-10-22 DIAGNOSIS — Z8619 Personal history of other infectious and parasitic diseases: Secondary | ICD-10-CM

## 2024-10-22 LAB — BASIC METABOLIC PANEL WITH GFR
Anion gap: 10 (ref 5–15)
BUN: 14 mg/dL (ref 8–23)
CO2: 17 mmol/L — ABNORMAL LOW (ref 22–32)
Calcium: 6.7 mg/dL — ABNORMAL LOW (ref 8.9–10.3)
Chloride: 112 mmol/L — ABNORMAL HIGH (ref 98–111)
Creatinine, Ser: 0.63 mg/dL (ref 0.61–1.24)
GFR, Estimated: >60 mL/min
Glucose, Bld: 206 mg/dL — ABNORMAL HIGH (ref 70–99)
Potassium: 3.5 mmol/L (ref 3.5–5.1)
Sodium: 140 mmol/L (ref 135–145)

## 2024-10-22 LAB — CBC WITH DIFFERENTIAL/PLATELET
Abs Immature Granulocytes: 0.03 K/uL (ref 0.00–0.07)
Basophils Absolute: 0.1 K/uL (ref 0.0–0.1)
Basophils Relative: 2 %
Eosinophils Absolute: 0.3 K/uL (ref 0.0–0.5)
Eosinophils Relative: 8 %
HCT: 30.6 % — ABNORMAL LOW (ref 39.0–52.0)
Hemoglobin: 9.2 g/dL — ABNORMAL LOW (ref 13.0–17.0)
Immature Granulocytes: 1 %
Lymphocytes Relative: 22 %
Lymphs Abs: 0.8 K/uL (ref 0.7–4.0)
MCH: 25.6 pg — ABNORMAL LOW (ref 26.0–34.0)
MCHC: 30.1 g/dL (ref 30.0–36.0)
MCV: 85 fL (ref 80.0–100.0)
Monocytes Absolute: 0.5 K/uL (ref 0.1–1.0)
Monocytes Relative: 15 %
Neutro Abs: 1.9 K/uL (ref 1.7–7.7)
Neutrophils Relative %: 52 %
Platelets: 248 K/uL (ref 150–400)
RBC: 3.6 MIL/uL — ABNORMAL LOW (ref 4.22–5.81)
RDW: 14.5 % (ref 11.5–15.5)
WBC: 3.6 K/uL — ABNORMAL LOW (ref 4.0–10.5)
nRBC: 0 % (ref 0.0–0.2)

## 2024-10-22 LAB — PRO BRAIN NATRIURETIC PEPTIDE: Pro Brain Natriuretic Peptide: 104 pg/mL

## 2024-10-22 LAB — GLUCOSE, CAPILLARY: Glucose-Capillary: 319 mg/dL — ABNORMAL HIGH (ref 70–99)

## 2024-10-22 LAB — TROPONIN T, HIGH SENSITIVITY
Troponin T High Sensitivity: 21 ng/L — ABNORMAL HIGH (ref 0–19)
Troponin T High Sensitivity: <15 ng/L (ref 0–19)

## 2024-10-22 LAB — RESP PANEL BY RT-PCR (RSV, FLU A&B, COVID)  RVPGX2
Influenza A by PCR: NEGATIVE
Influenza B by PCR: NEGATIVE
Resp Syncytial Virus by PCR: NEGATIVE
SARS Coronavirus 2 by RT PCR: NEGATIVE

## 2024-10-22 MED ORDER — POLYETHYLENE GLYCOL 3350 17 G PO PACK: 17.0000 g | PACK | Freq: Every day | ORAL | Status: DC | PRN

## 2024-10-22 MED ORDER — INSULIN ASPART 100 UNIT/ML IJ SOLN
0.0000 [IU] | Freq: Every day | INTRAMUSCULAR | Status: DC
  Administered 2024-10-22: 4 [IU] via SUBCUTANEOUS
  Filled 2024-10-22: qty 4

## 2024-10-22 MED ORDER — KETOROLAC TROMETHAMINE 15 MG/ML IJ SOLN
15.0000 mg | Freq: Once | INTRAMUSCULAR | Status: AC
  Administered 2024-10-22: 15 mg via INTRAVENOUS
  Filled 2024-10-22: qty 1

## 2024-10-22 MED ORDER — VITAMIN C 500 MG PO TABS
500.0000 mg | ORAL_TABLET | Freq: Every day | ORAL | Status: DC
  Administered 2024-10-23 – 2024-10-28 (×6): 500 mg via ORAL
  Filled 2024-10-22 (×6): qty 1

## 2024-10-22 MED ORDER — MONTELUKAST SODIUM 10 MG PO TABS
10.0000 mg | ORAL_TABLET | Freq: Every day | ORAL | Status: DC
  Administered 2024-10-22 – 2024-10-27 (×6): 10 mg via ORAL
  Filled 2024-10-22 (×6): qty 1

## 2024-10-22 MED ORDER — LORATADINE 10 MG PO TABS: 10.0000 mg | ORAL_TABLET | Freq: Every day | ORAL | Status: DC

## 2024-10-22 MED ORDER — IPRATROPIUM-ALBUTEROL 0.5-2.5 (3) MG/3ML IN SOLN
3.0000 mL | Freq: Once | RESPIRATORY_TRACT | Status: AC
  Administered 2024-10-22: 3 mL via RESPIRATORY_TRACT
  Filled 2024-10-22: qty 3

## 2024-10-22 MED ORDER — SODIUM CHLORIDE 0.9 % IV BOLUS
500.0000 mL | Freq: Once | INTRAVENOUS | Status: AC
  Administered 2024-10-22: 500 mL via INTRAVENOUS

## 2024-10-22 MED ORDER — CALCIUM CARBONATE ANTACID 500 MG PO CHEW
2.0000 | CHEWABLE_TABLET | Freq: Once | ORAL | Status: AC
  Administered 2024-10-22: 400 mg via ORAL
  Filled 2024-10-22: qty 2

## 2024-10-22 MED ORDER — ENOXAPARIN SODIUM 40 MG/0.4ML IJ SOSY: 40.0000 mg | PREFILLED_SYRINGE | INTRAMUSCULAR | Status: DC

## 2024-10-22 MED ORDER — ENOXAPARIN SODIUM 40 MG/0.4ML IJ SOSY
40.0000 mg | PREFILLED_SYRINGE | INTRAMUSCULAR | Status: DC
  Administered 2024-10-22: 40 mg via SUBCUTANEOUS
  Filled 2024-10-22: qty 0.4

## 2024-10-22 MED ORDER — EMPAGLIFLOZIN-METFORMIN HCL 12.5-1000 MG PO TABS: 1.0000 | ORAL_TABLET | Freq: Two times a day (BID) | ORAL | Status: DC

## 2024-10-22 MED ORDER — SODIUM CHLORIDE 0.9 % IV BOLUS: 1000.0000 mL | Freq: Once | INTRAVENOUS | Status: DC

## 2024-10-22 MED ORDER — IPRATROPIUM-ALBUTEROL 0.5-2.5 (3) MG/3ML IN SOLN
3.0000 mL | RESPIRATORY_TRACT | Status: DC
  Administered 2024-10-22 – 2024-10-23 (×5): 3 mL via RESPIRATORY_TRACT
  Filled 2024-10-22 (×6): qty 3

## 2024-10-22 MED ORDER — PREDNISONE 20 MG PO TABS
40.0000 mg | ORAL_TABLET | Freq: Every day | ORAL | Status: AC
  Administered 2024-10-22 – 2024-10-26 (×5): 40 mg via ORAL
  Filled 2024-10-22 (×5): qty 2

## 2024-10-22 MED ORDER — TIMOLOL MALEATE 0.5 % OP SOLN
1.0000 [drp] | Freq: Two times a day (BID) | OPHTHALMIC | Status: DC
  Administered 2024-10-22 – 2024-10-28 (×12): 1 [drp] via OPHTHALMIC
  Filled 2024-10-22: qty 5

## 2024-10-22 MED ORDER — CLOPIDOGREL BISULFATE 75 MG PO TABS
75.0000 mg | ORAL_TABLET | Freq: Every evening | ORAL | Status: DC
  Administered 2024-10-22: 75 mg via ORAL
  Filled 2024-10-22: qty 1

## 2024-10-22 MED ORDER — MAGNESIUM SULFATE 2 GM/50ML IV SOLN
2.0000 g | Freq: Once | INTRAVENOUS | Status: AC
  Administered 2024-10-22: 2 g via INTRAVENOUS
  Filled 2024-10-22: qty 50

## 2024-10-22 MED ORDER — ALBUTEROL SULFATE (2.5 MG/3ML) 0.083% IN NEBU
10.0000 mg | INHALATION_SOLUTION | Freq: Once | RESPIRATORY_TRACT | Status: AC
  Administered 2024-10-22: 10 mg via RESPIRATORY_TRACT
  Filled 2024-10-22: qty 12

## 2024-10-22 MED ORDER — INSULIN GLARGINE-YFGN 100 UNIT/ML ~~LOC~~ SOLN
20.0000 [IU] | Freq: Every day | SUBCUTANEOUS | Status: DC
  Administered 2024-10-22: 20 [IU] via SUBCUTANEOUS
  Filled 2024-10-22 (×4): qty 0.2

## 2024-10-22 MED ORDER — ACETAMINOPHEN 325 MG PO TABS
650.0000 mg | ORAL_TABLET | Freq: Four times a day (QID) | ORAL | Status: DC | PRN
  Administered 2024-10-22: 650 mg via ORAL
  Filled 2024-10-22: qty 2

## 2024-10-22 MED ORDER — ATORVASTATIN CALCIUM 40 MG PO TABS
40.0000 mg | ORAL_TABLET | Freq: Every day | ORAL | Status: DC
  Administered 2024-10-22 – 2024-10-27 (×6): 40 mg via ORAL
  Filled 2024-10-22 (×6): qty 1

## 2024-10-22 MED ORDER — ONDANSETRON HCL 4 MG/2ML IJ SOLN
4.0000 mg | Freq: Once | INTRAMUSCULAR | Status: AC
  Administered 2024-10-22: 4 mg via INTRAVENOUS
  Filled 2024-10-22: qty 2

## 2024-10-22 MED ORDER — PREDNISONE 20 MG PO TABS: 40.0000 mg | ORAL_TABLET | Freq: Every day | ORAL | Status: DC

## 2024-10-22 MED ORDER — PREGABALIN 100 MG PO CAPS
100.0000 mg | ORAL_CAPSULE | Freq: Three times a day (TID) | ORAL | Status: DC
  Administered 2024-10-22 – 2024-10-28 (×18): 100 mg via ORAL
  Filled 2024-10-22 (×18): qty 1

## 2024-10-22 MED ORDER — IOHEXOL 350 MG/ML SOLN
75.0000 mL | Freq: Once | INTRAVENOUS | Status: AC | PRN
  Administered 2024-10-22: 75 mL via INTRAVENOUS

## 2024-10-22 MED ORDER — ADULT MULTIVITAMIN W/MINERALS CH
1.0000 | ORAL_TABLET | Freq: Every day | ORAL | Status: DC
  Administered 2024-10-23 – 2024-10-28 (×6): 1 via ORAL
  Filled 2024-10-22 (×6): qty 1

## 2024-10-22 MED ORDER — INSULIN ASPART 100 UNIT/ML IJ SOLN
0.0000 [IU] | Freq: Three times a day (TID) | INTRAMUSCULAR | Status: DC
  Filled 2024-10-22: qty 1

## 2024-10-22 MED ORDER — IPRATROPIUM BROMIDE 0.02 % IN SOLN: 0.5000 mg | RESPIRATORY_TRACT | Status: DC

## 2024-10-22 MED ORDER — EMPAGLIFLOZIN 25 MG PO TABS
25.0000 mg | ORAL_TABLET | Freq: Every day | ORAL | Status: DC
  Administered 2024-10-23: 25 mg via ORAL
  Filled 2024-10-22: qty 1

## 2024-10-22 MED ORDER — MORPHINE SULFATE (PF) 4 MG/ML IV SOLN
4.0000 mg | Freq: Once | INTRAVENOUS | Status: AC
  Administered 2024-10-22: 4 mg via INTRAVENOUS
  Filled 2024-10-22: qty 1

## 2024-10-22 MED ORDER — ACETAMINOPHEN 650 MG RE SUPP: 650.0000 mg | Freq: Four times a day (QID) | RECTAL | Status: DC | PRN

## 2024-10-22 MED ORDER — NAPROXEN 250 MG PO TABS
500.0000 mg | ORAL_TABLET | Freq: Two times a day (BID) | ORAL | Status: DC | PRN
  Administered 2024-10-22 – 2024-10-23 (×2): 500 mg via ORAL
  Filled 2024-10-22 (×3): qty 2

## 2024-10-22 MED ORDER — METFORMIN HCL 500 MG PO TABS
1000.0000 mg | ORAL_TABLET | Freq: Two times a day (BID) | ORAL | Status: DC
  Administered 2024-10-23 – 2024-10-28 (×10): 1000 mg via ORAL
  Filled 2024-10-22 (×10): qty 2

## 2024-10-22 NOTE — ED Notes (Signed)
   Pt transported to ct

## 2024-10-22 NOTE — ED Notes (Signed)
 CCMD Called

## 2024-10-22 NOTE — Progress Notes (Signed)
 Pt FSBS 319 per glucometer

## 2024-10-22 NOTE — ED Triage Notes (Signed)
 PT BIB [facility] d/t [facility] for 7 days Before calling 911 used inhaler times w/o relief, c/o pain in chest  and tightness  10 albuterol  , atropine 125 solumedrol 20 left hand Tracheal wheezing noise CBG 283 97% RA 100% NEB 156/74 100 HR 28 RR

## 2024-10-22 NOTE — ED Notes (Signed)
 Xray at bedside

## 2024-10-22 NOTE — ED Notes (Signed)
 Pt c/o chest tightness, sitting tripod. EDP made aware.

## 2024-10-22 NOTE — ED Provider Notes (Signed)
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    Final Clinical Impression(s) / ED Diagnoses Final diagnoses:  Severe persistent asthma with exacerbation (HCC)  COPD exacerbation (HCC)  Hypoxia  Aortic atherosclerosis         [1]  Social History Tobacco Use   Smoking status: Former    Current packs/day: 0.00    Average packs/day: 1 pack/day for 45.0 years (45.0 ttl pk-yrs)     Types: Cigarettes    Start date: 08/22/1977    Quit date: 08/22/2022    Years since quitting: 2.1    Passive exposure: Never   Smokeless tobacco: Never   Tobacco comments:    Quit x 2 weeks.  Vaping Use   Vaping  status: Never Used  Substance Use Topics   Alcohol  use: Not Currently    Alcohol /week: 0.0 standard drinks of alcohol     Comment: 08/24/2019 nothing since <2010   Drug use: Not Currently    Types: Cocaine    Comment: 10/23/2018 nothing since <1990     Elnor Jayson LABOR, DO 10/22/24 1547  "
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
## 2024-10-22 NOTE — Hospital Course (Addendum)
 Will need cardiology follow up for aortic stenosis, echo every two years.  May have component of gastroparesis, moderate amount of food in his stomach, concerning for possible gastroparesis/gastrointestinal motility.  Recommending iron  infusion as well  #Asthma-COPD overlap syndrome #Acute exacerbation of COPD with asthma #Asthma exacerbation Patient presented with a week long history of worsening productive cough, pleuritic chest pain, dyspnea, fatigue, and dizziness in the setting of restarting his COPD and asthma medications. On initial exam, patient with increased work of breathing, decreased breath sounds and wheezing bilaterally. Patient endorses symptomatic improvement on albuterol  treatments. CXR showed no acute pathology or consolidation and CT angiogram showed no evidence of pulmonary embolism. Current etiology of patient's breathing difficulties is thought to be asthma/COPD exacerbation in the setting of URI and recent medication pause. Patient was started on home Duoneb, montelukast  and started on 5 day steroid dose pack. Patient required additional respiratory support so home Breztri  and Pulmicort  also restarted.  Acute on chronic normocytic anemia Concern for UGI Patient reported few weeks long history of dizziness, and endorses feeling like the room is spinning in the ER.  Patient denied weakness, hematochezia or melena.  Patient's hemoglobin on admission is 9.2, and recheck of 8.2. Patient received 1 L IVF, hemoglobin dropped to 6.9 without improvement in BUN. Patient was given total of 2 units PRBC with H&H stable at 8.3&25. Initial concern now for UGI bleed given lab values did not improve with fluid bolus. Rectal exam performed to rule out overt GI bleed with empty vault and negative occult stool. GI consulted, who ordered CT A/P to rule out retroperitoneal bleed, no acute concern and some constipation noted. Capsule endoscopy was performed showing 1 nonbleeding angioectasia in the  duodenal bulb, 1 mucosal break vs diminutive angioectasia in proximal duodenum. GI recommended small bowel enteroscopy which showed ***  T2DM Diabetic neuropathy Patient has T2DM with a recent A1C of 7.6 in Nov 2025, currently on 20 units of long acting insulin , trulicity , synjardy , and actos . Patient reports taking most of these medications outside of trulicity  after recent surgery. Patient also has diabetic neuropathy managed with Lyrica , restarted in hospital. There are no acute concerns for his diabetes, and patient started on SSI and 20 units of Semglee  daily. Titrated patient to resistant-SSI and 25 units of long-acting insulin  with much improvement in his CBGs.    Osteoarthritis of right knee Patient has recent history of total right knee arthoplasty approximately 6 weeks ago. Patient currently not experiencing any symptoms but has been on NSAIDs for pain. NSAIDs were held in the setting of his acute anemia, though patient receiving scheduled tylenol  for pain and inflammation.  HTN CAD Moderate AS/MS Patient has a history of CAD and moderate AS/MS. Patient is currently experiencing chest pain with cough only, BMP WNL, declining troponins, and non-concerning EKGs. Etiology of chest pain is likely pleuritic given association with cough. We  restarted patient on appropriate home medications and held Plavix  in the setting of unspecified anemia.

## 2024-10-22 NOTE — ED Notes (Signed)
 Admitting at bedside

## 2024-10-22 NOTE — ED Notes (Addendum)
 Pt unable to ambulate w/ pulse ox, pt was unsteady on feet and felt lightheaded/dizzy, EDP made aware.

## 2024-10-23 ENCOUNTER — Encounter (HOSPITAL_COMMUNITY): Payer: Self-pay | Admitting: Internal Medicine

## 2024-10-23 ENCOUNTER — Inpatient Hospital Stay (HOSPITAL_COMMUNITY)

## 2024-10-23 DIAGNOSIS — I350 Nonrheumatic aortic (valve) stenosis: Secondary | ICD-10-CM

## 2024-10-23 DIAGNOSIS — R0789 Other chest pain: Secondary | ICD-10-CM

## 2024-10-23 DIAGNOSIS — I2510 Atherosclerotic heart disease of native coronary artery without angina pectoris: Secondary | ICD-10-CM | POA: Diagnosis not present

## 2024-10-23 DIAGNOSIS — D649 Anemia, unspecified: Secondary | ICD-10-CM | POA: Diagnosis not present

## 2024-10-23 DIAGNOSIS — I10 Essential (primary) hypertension: Secondary | ICD-10-CM | POA: Diagnosis not present

## 2024-10-23 DIAGNOSIS — E875 Hyperkalemia: Secondary | ICD-10-CM | POA: Diagnosis not present

## 2024-10-23 DIAGNOSIS — R7989 Other specified abnormal findings of blood chemistry: Secondary | ICD-10-CM | POA: Insufficient documentation

## 2024-10-23 DIAGNOSIS — E1142 Type 2 diabetes mellitus with diabetic polyneuropathy: Secondary | ICD-10-CM | POA: Diagnosis not present

## 2024-10-23 DIAGNOSIS — J441 Chronic obstructive pulmonary disease with (acute) exacerbation: Secondary | ICD-10-CM

## 2024-10-23 DIAGNOSIS — E878 Other disorders of electrolyte and fluid balance, not elsewhere classified: Secondary | ICD-10-CM | POA: Insufficient documentation

## 2024-10-23 DIAGNOSIS — K5900 Constipation, unspecified: Secondary | ICD-10-CM | POA: Diagnosis not present

## 2024-10-23 DIAGNOSIS — M1711 Unilateral primary osteoarthritis, right knee: Secondary | ICD-10-CM | POA: Diagnosis not present

## 2024-10-23 DIAGNOSIS — I050 Rheumatic mitral stenosis: Secondary | ICD-10-CM

## 2024-10-23 LAB — CBC WITH DIFFERENTIAL/PLATELET
Abs Immature Granulocytes: 0.1 K/uL — ABNORMAL HIGH (ref 0.00–0.07)
Basophils Absolute: 0 K/uL (ref 0.0–0.1)
Basophils Relative: 0 %
Eosinophils Absolute: 0 K/uL (ref 0.0–0.5)
Eosinophils Relative: 0 %
HCT: 26.8 % — ABNORMAL LOW (ref 39.0–52.0)
Hemoglobin: 8.2 g/dL — ABNORMAL LOW (ref 13.0–17.0)
Immature Granulocytes: 1 %
Lymphocytes Relative: 5 %
Lymphs Abs: 0.5 K/uL — ABNORMAL LOW (ref 0.7–4.0)
MCH: 25.2 pg — ABNORMAL LOW (ref 26.0–34.0)
MCHC: 30.6 g/dL (ref 30.0–36.0)
MCV: 82.5 fL (ref 80.0–100.0)
Monocytes Absolute: 0.5 K/uL (ref 0.1–1.0)
Monocytes Relative: 5 %
Neutro Abs: 9.3 K/uL — ABNORMAL HIGH (ref 1.7–7.7)
Neutrophils Relative %: 89 %
Platelets: 312 K/uL (ref 150–400)
RBC: 3.25 MIL/uL — ABNORMAL LOW (ref 4.22–5.81)
RDW: 14.6 % (ref 11.5–15.5)
WBC: 10.4 K/uL (ref 4.0–10.5)
nRBC: 0 % (ref 0.0–0.2)

## 2024-10-23 LAB — BASIC METABOLIC PANEL WITH GFR
Anion gap: 11 (ref 5–15)
Anion gap: 10 (ref 5–15)
Anion gap: 12 (ref 5–15)
BUN: 62 mg/dL — ABNORMAL HIGH (ref 8–23)
BUN: 75 mg/dL — ABNORMAL HIGH (ref 8–23)
BUN: 75 mg/dL — ABNORMAL HIGH (ref 8–23)
CO2: 20 mmol/L — ABNORMAL LOW (ref 22–32)
CO2: 20 mmol/L — ABNORMAL LOW (ref 22–32)
CO2: 17 mmol/L — ABNORMAL LOW (ref 22–32)
Calcium: 8.7 mg/dL — ABNORMAL LOW (ref 8.9–10.3)
Calcium: 8.7 mg/dL — ABNORMAL LOW (ref 8.9–10.3)
Calcium: 8.6 mg/dL — ABNORMAL LOW (ref 8.9–10.3)
Chloride: 97 mmol/L — ABNORMAL LOW (ref 98–111)
Chloride: 97 mmol/L — ABNORMAL LOW (ref 98–111)
Chloride: 96 mmol/L — ABNORMAL LOW (ref 98–111)
Creatinine, Ser: 1.06 mg/dL (ref 0.61–1.24)
Creatinine, Ser: 1.06 mg/dL (ref 0.61–1.24)
Creatinine, Ser: 1.17 mg/dL (ref 0.61–1.24)
GFR, Estimated: >60 mL/min
GFR, Estimated: >60 mL/min
GFR, Estimated: >60 mL/min
Glucose, Bld: 284 mg/dL — ABNORMAL HIGH (ref 70–99)
Glucose, Bld: 267 mg/dL — ABNORMAL HIGH (ref 70–99)
Glucose, Bld: 292 mg/dL — ABNORMAL HIGH (ref 70–99)
Potassium: 6.1 mmol/L — ABNORMAL HIGH (ref 3.5–5.1)
Potassium: 6 mmol/L — ABNORMAL HIGH (ref 3.5–5.1)
Potassium: 4.8 mmol/L (ref 3.5–5.1)
Sodium: 128 mmol/L — ABNORMAL LOW (ref 135–145)
Sodium: 127 mmol/L — ABNORMAL LOW (ref 135–145)
Sodium: 125 mmol/L — ABNORMAL LOW (ref 135–145)

## 2024-10-23 LAB — BLOOD GAS, VENOUS
Acid-base deficit: 1.7 mmol/L (ref 0.0–2.0)
Bicarbonate: 23 mmol/L (ref 20.0–28.0)
Drawn by: 8168
O2 Saturation: 86.8 %
Patient temperature: 36.8
pCO2, Ven: 38 mmHg — ABNORMAL LOW (ref 44–60)
pH, Ven: 7.39 (ref 7.25–7.43)
pO2, Ven: 52 mmHg — ABNORMAL HIGH (ref 32–45)

## 2024-10-23 LAB — GLUCOSE, CAPILLARY
Glucose-Capillary: 258 mg/dL — ABNORMAL HIGH (ref 70–99)
Glucose-Capillary: 207 mg/dL — ABNORMAL HIGH (ref 70–99)
Glucose-Capillary: 276 mg/dL — ABNORMAL HIGH (ref 70–99)
Glucose-Capillary: 307 mg/dL — ABNORMAL HIGH (ref 70–99)
Glucose-Capillary: 271 mg/dL — ABNORMAL HIGH (ref 70–99)

## 2024-10-23 LAB — TECHNOLOGIST SMEAR REVIEW

## 2024-10-23 LAB — HEMOGLOBIN AND HEMATOCRIT, BLOOD
HCT: 21.5 % — ABNORMAL LOW (ref 39.0–52.0)
HCT: 25.8 % — ABNORMAL LOW (ref 39.0–52.0)
Hemoglobin: 6.9 g/dL — CL (ref 13.0–17.0)
Hemoglobin: 8.4 g/dL — ABNORMAL LOW (ref 13.0–17.0)

## 2024-10-23 LAB — OCCULT BLOOD X 1 CARD TO LAB, STOOL: Fecal Occult Bld: NEGATIVE

## 2024-10-23 LAB — PREPARE RBC (CROSSMATCH)

## 2024-10-23 MED ORDER — INSULIN ASPART 100 UNIT/ML IJ SOLN
0.0000 [IU] | Freq: Three times a day (TID) | INTRAMUSCULAR | Status: DC
  Administered 2024-10-23: 15 [IU] via SUBCUTANEOUS
  Administered 2024-10-24: 3 [IU] via SUBCUTANEOUS
  Administered 2024-10-24: 4 [IU] via SUBCUTANEOUS
  Administered 2024-10-24: 15 [IU] via SUBCUTANEOUS
  Administered 2024-10-25: 7 [IU] via SUBCUTANEOUS
  Administered 2024-10-25: 4 [IU] via SUBCUTANEOUS
  Administered 2024-10-26: 15 [IU] via SUBCUTANEOUS
  Administered 2024-10-26: 3 [IU] via SUBCUTANEOUS
  Administered 2024-10-26: 7 [IU] via SUBCUTANEOUS
  Administered 2024-10-27 (×2): 3 [IU] via SUBCUTANEOUS
  Administered 2024-10-27: 15 [IU] via SUBCUTANEOUS
  Administered 2024-10-28: 3 [IU] via SUBCUTANEOUS
  Administered 2024-10-28: 11 [IU] via SUBCUTANEOUS
  Filled 2024-10-23 (×14): qty 1

## 2024-10-23 MED ORDER — ARFORMOTEROL TARTRATE 15 MCG/2ML IN NEBU
15.0000 ug | INHALATION_SOLUTION | Freq: Two times a day (BID) | RESPIRATORY_TRACT | Status: DC
  Administered 2024-10-23 – 2024-10-28 (×10): 15 ug via RESPIRATORY_TRACT
  Filled 2024-10-23 (×11): qty 2

## 2024-10-23 MED ORDER — DICLOFENAC SODIUM 1 % EX GEL
2.0000 g | Freq: Four times a day (QID) | CUTANEOUS | Status: DC
  Administered 2024-10-25: 2 g via TOPICAL
  Filled 2024-10-23: qty 100

## 2024-10-23 MED ORDER — IOHEXOL 350 MG/ML SOLN
75.0000 mL | Freq: Once | INTRAVENOUS | Status: AC | PRN
  Administered 2024-10-23: 75 mL via INTRAVENOUS

## 2024-10-23 MED ORDER — INSULIN GLARGINE 100 UNIT/ML ~~LOC~~ SOLN
25.0000 [IU] | Freq: Every day | SUBCUTANEOUS | Status: DC
  Administered 2024-10-23: 25 [IU] via SUBCUTANEOUS
  Filled 2024-10-23 (×2): qty 0.25

## 2024-10-23 MED ORDER — PANTOPRAZOLE SODIUM 40 MG PO TBEC
40.0000 mg | DELAYED_RELEASE_TABLET | Freq: Every day | ORAL | Status: DC
  Administered 2024-10-23: 40 mg via ORAL
  Filled 2024-10-23: qty 1

## 2024-10-23 MED ORDER — REVEFENACIN 175 MCG/3ML IN SOLN: 175.0000 ug | Freq: Every day | RESPIRATORY_TRACT | Status: DC

## 2024-10-23 MED ORDER — ACETAMINOPHEN 500 MG PO TABS
1000.0000 mg | ORAL_TABLET | Freq: Three times a day (TID) | ORAL | Status: DC
  Administered 2024-10-23 – 2024-10-27 (×15): 1000 mg via ORAL
  Filled 2024-10-23 (×16): qty 2

## 2024-10-23 MED ORDER — ACETAMINOPHEN 650 MG RE SUPP: 650.0000 mg | Freq: Four times a day (QID) | RECTAL | Status: DC | PRN

## 2024-10-23 MED ORDER — GUAIFENESIN-DM 100-10 MG/5ML PO SYRP
5.0000 mL | ORAL_SOLUTION | ORAL | Status: DC | PRN
  Administered 2024-10-23: 5 mL via ORAL
  Filled 2024-10-23: qty 5

## 2024-10-23 MED ORDER — HYDROCOD POLI-CHLORPHE POLI ER 10-8 MG/5ML PO SUER
5.0000 mL | Freq: Two times a day (BID) | ORAL | Status: DC
  Administered 2024-10-23 – 2024-10-26 (×8): 5 mL via ORAL
  Filled 2024-10-23 (×8): qty 5

## 2024-10-23 MED ORDER — SODIUM ZIRCONIUM CYCLOSILICATE 10 G PO PACK
10.0000 g | PACK | Freq: Once | ORAL | Status: AC
  Administered 2024-10-23: 10 g via ORAL
  Filled 2024-10-23: qty 1

## 2024-10-23 MED ORDER — INSULIN ASPART 100 UNIT/ML IV SOLN
10.0000 [IU] | Freq: Once | INTRAVENOUS | Status: AC
  Administered 2024-10-23: 10 [IU] via INTRAVENOUS
  Filled 2024-10-23: qty 1

## 2024-10-23 MED ORDER — INSULIN ASPART 100 UNIT/ML IJ SOLN
0.0000 [IU] | Freq: Every day | INTRAMUSCULAR | Status: DC
  Administered 2024-10-23: 3 [IU] via SUBCUTANEOUS
  Administered 2024-10-24 – 2024-10-25 (×2): 4 [IU] via SUBCUTANEOUS
  Administered 2024-10-26: 2 [IU] via SUBCUTANEOUS
  Filled 2024-10-23 (×4): qty 1

## 2024-10-23 MED ORDER — MELATONIN 3 MG PO TABS
3.0000 mg | ORAL_TABLET | Freq: Every day | ORAL | Status: DC
  Administered 2024-10-23 – 2024-10-27 (×5): 3 mg via ORAL
  Filled 2024-10-23 (×5): qty 1

## 2024-10-23 MED ORDER — CALCIUM GLUCONATE-NACL 1-0.675 GM/50ML-% IV SOLN
1.0000 g | Freq: Once | INTRAVENOUS | Status: AC
  Administered 2024-10-23: 1000 mg via INTRAVENOUS
  Filled 2024-10-23: qty 50

## 2024-10-23 MED ORDER — LIDOCAINE 5 % EX PTCH
1.0000 | MEDICATED_PATCH | CUTANEOUS | Status: DC
  Filled 2024-10-23: qty 1

## 2024-10-23 MED ORDER — SODIUM CHLORIDE 0.9 % IV BOLUS
1000.0000 mL | Freq: Once | INTRAVENOUS | Status: AC
  Administered 2024-10-23: 1000 mL via INTRAVENOUS

## 2024-10-23 MED ORDER — ARFORMOTEROL TARTRATE 15 MCG/2ML IN NEBU: 15.0000 ug | INHALATION_SOLUTION | Freq: Two times a day (BID) | RESPIRATORY_TRACT | Status: DC

## 2024-10-23 MED ORDER — IPRATROPIUM-ALBUTEROL 0.5-2.5 (3) MG/3ML IN SOLN
3.0000 mL | RESPIRATORY_TRACT | Status: DC
  Administered 2024-10-23 – 2024-10-24 (×7): 3 mL via RESPIRATORY_TRACT
  Filled 2024-10-23 (×7): qty 3

## 2024-10-23 MED ORDER — PANTOPRAZOLE SODIUM 40 MG IV SOLR
40.0000 mg | Freq: Two times a day (BID) | INTRAVENOUS | Status: DC
  Administered 2024-10-23 – 2024-10-25 (×5): 40 mg via INTRAVENOUS
  Filled 2024-10-23 (×5): qty 10

## 2024-10-23 MED ORDER — INSULIN GLARGINE-YFGN 100 UNIT/ML ~~LOC~~ SOLN: 25.0000 [IU] | Freq: Every day | SUBCUTANEOUS | Status: DC

## 2024-10-23 MED ORDER — IPRATROPIUM-ALBUTEROL 0.5-2.5 (3) MG/3ML IN SOLN
3.0000 mL | RESPIRATORY_TRACT | Status: DC | PRN
  Administered 2024-10-23 – 2024-10-28 (×8): 3 mL via RESPIRATORY_TRACT
  Filled 2024-10-23 (×6): qty 3

## 2024-10-23 MED ORDER — BUDESONIDE 0.25 MG/2ML IN SUSP: 0.2500 mg | Freq: Two times a day (BID) | RESPIRATORY_TRACT | Status: DC

## 2024-10-23 MED ORDER — ARFORMOTEROL TARTRATE 15 MCG/2ML IN NEBU: 15.0000 ug | INHALATION_SOLUTION | Freq: Once | RESPIRATORY_TRACT | Status: DC

## 2024-10-23 MED ORDER — SODIUM CHLORIDE 0.9% IV SOLUTION: Freq: Once | INTRAVENOUS | Status: DC

## 2024-10-23 MED ORDER — BUDESONIDE 0.25 MG/2ML IN SUSP
RESPIRATORY_TRACT | Status: AC
  Administered 2024-10-23: 0.25 mg
  Filled 2024-10-23: qty 2

## 2024-10-23 MED ORDER — BUDESONIDE 0.25 MG/2ML IN SUSP
0.2500 mg | Freq: Two times a day (BID) | RESPIRATORY_TRACT | Status: DC
  Administered 2024-10-23 – 2024-10-28 (×10): 0.25 mg via RESPIRATORY_TRACT
  Filled 2024-10-23 (×11): qty 2

## 2024-10-23 MED ORDER — ACETAMINOPHEN 650 MG RE SUPP: 650.0000 mg | Freq: Three times a day (TID) | RECTAL | Status: DC

## 2024-10-23 MED ORDER — AZITHROMYCIN 500 MG PO TABS
500.0000 mg | ORAL_TABLET | Freq: Every day | ORAL | Status: AC
  Administered 2024-10-23 – 2024-10-25 (×3): 500 mg via ORAL
  Filled 2024-10-23 (×3): qty 1

## 2024-10-23 MED ORDER — ACETAMINOPHEN 500 MG PO TABS: 1000.0000 mg | ORAL_TABLET | Freq: Three times a day (TID) | ORAL | Status: DC | PRN

## 2024-10-23 NOTE — Progress Notes (Signed)
"   This nurse notified by off going nurse of new orders for iv insulin  and hyperkalemia treatments. This nurse in to assess patient and administer iv insulin , lokelma   and calcium  gluconate per orders. Subcutaneous insulin  ordered dose for this am held r/t iv insulin  administration. Patient in lowest position with call light in reach. Current plan of care in progress "

## 2024-10-23 NOTE — Progress Notes (Addendum)
 "  HD#1 SUBJECTIVE:  Patient Summary: Richard [name] is a 64 y.o. with a pertinent PMH of COPD, asthma, CAD, moderate AS/MS, HTN, and T2DM w/peripheral neuropathy who presented to the [facility] ED with dyspnea and cough and subsequently admitted for asthma exacerbation.    Overnight Events: acute respiratory exacerbation, patient placed on 10 L nasal cannula with O2 sat at 100%, tachycardic and diffuse wheezing on exam.  Patient given DuoNeb and Pulmicort  nebulizer treatments and breathing improved, able to wean supplemental oxygen  down to 2 L.  Interim History: Patient was seen at bedside this morning with rounding team, says he is feeling pretty poorly. Was in the ED since 9 am yesterday morning and was worried when he was transferred to the second floor unit because they weren't prepared to deal with his illness. He is short of breath and fatigued but notes that his symptoms improve for 3 hours after his breathing treatments. He is very tired and really would like to sleep. Fiance not at bedside, she has to work today. Not taking naproxen  much anymore. Only taking with oxy for his knee. His knees have improved so he hasn't been taking much. Only taken one tablet once the day before he came in because he thought he had a cold and thought it would help the pain in his chest.  Discussed with patient the results of his labs this morning could be concerning for volume depletion or source of bleeding that has not yet been identified, so we are going to continue further workup, though this would explain the reason why patient continues to feel poorly even with improvement with this respiratory distress.  OBJECTIVE:  Vital Signs: Vitals:   10/23/24 0731 10/23/24 1127 10/23/24 1450 10/23/24 1508  BP: (!) 108/57 94/60 119/64 132/63  Pulse: 94 85 96 96  Resp: 20 20 (!) 22 19  Temp: 98.8 F (37.1 C)  98.7 F (37.1 C) 98.3 F (36.8 C)  TempSrc: Oral  Oral Oral  SpO2: 100% 100% 98% 100%  Weight:       Height:       Supplemental O2: Room Air SpO2: 100 % O2 Flow Rate (L/min): 2 L/min  Filed Weights   10/22/24 0921  Weight: 103 kg    Intake/Output Summary (Last 24 hours) at 10/23/2024 1735 Last data filed at 10/23/2024 1722 Gross per 24 hour  Intake --  Output 2675 ml  Net -2675 ml   Net IO Since Admission: -2,675 mL [10/23/24 1735]  Physical Exam: Constitutional: chronically ill-appearing elderly gentleman laying in hospital bed in minimal respiratory distress Cardiovascular: regular rate and rhythm, 3/6 crescendo-decrescendo systolic murmur Pulmonary/Chest: mildly increased work of breathing on room air, able to speak in full sentences, decreased breath sounds bilaterally, mild inspiratory and expiratory wheezing in all lung fields bilaterally Abdominal: soft, mild TTP in LUQ, non-distended Neurological: alert & oriented x 3, 5/5 strength in bilateral upper and lower extremities Skin: warm and dry on exam, no edema noted Rectal exam: rectal vault negative, no apparent/active bleeding visualized Psych: congruent affect and mood  Patient Lines/Drains/Airways Status     Active Line/Drains/Airways     Name Placement date Placement time Site Days   Peripheral IV 10/22/24 20 G Left;Posterior Hand 10/22/24  0845  Hand  1   Peripheral IV 10/22/24 20 G Anterior;Distal;Left;Upper Arm 10/22/24  1409  Arm  1   Peripheral IV 10/23/24 20 G 1.88 Anterior;Proximal;Right Forearm 10/23/24  0907  Forearm  less than 1   Wound  08/24/24 1403 Surgical Closed Surgical Incision Knee Right 08/24/24  1403  Knee  60           Pertinent labs and imaging:     Latest Ref Rng & Units 10/23/2024   11:10 AM 10/23/2024    2:29 AM 10/22/2024    9:26 AM  CBC  WBC 4.0 - 10.5 K/uL  10.4  3.6   Hemoglobin 13.0 - 17.0 g/dL 6.9  8.2  9.2   Hematocrit 39.0 - 52.0 % 21.5  26.8  30.6   Platelets 150 - 400 K/uL  312  248        Latest Ref Rng & Units 10/23/2024   11:10 AM 10/23/2024    5:17 AM 10/23/2024     2:23 AM  CMP  Glucose 70 - 99 mg/dL 707  732  715   BUN 8 - 23 mg/dL 75  75  62   Creatinine 0.61 - 1.24 mg/dL 8.82  8.93  8.93   Sodium 135 - 145 mmol/L 125  127  128   Potassium 3.5 - 5.1 mmol/L 4.8  6.0  6.1   Chloride 98 - 111 mmol/L 96  97  97   CO2 22 - 32 mmol/L 17  20  20    Calcium  8.9 - 10.3 mg/dL 8.6  8.7  8.7     No results found.   ASSESSMENT/PLAN:  Assessment: Principal Problem:   Acute exacerbation of COPD with asthma (HCC) Active Problems:   Asthma-COPD overlap syndrome (HCC)   Acute on chronic anemia   Anterior chest wall pain   DM (diabetes mellitus), type 2 (HCC)   Diabetic neuropathy, painful (HCC)   S/P TKR (total knee replacement), right   Leukopenia   Azotemia   Electrolyte imbalance (hyperkalemia, hyponatremia)  Richard [name] is a 64 y.o. person with a PMH of COPD, asthma, CAD, moderate AS/MS, HTN, and T2DM w/peripheral neuropathy who presented to the [facility] ED with dyspnea and cough and subsequently admitted for asthma exacerbation.  Plan: #Asthma-COPD overlap syndrome #Acute exacerbation of COPD with asthma #Asthma exacerbation Patient has had a week long history of worsening productive cough, pleuritic chest pain, dyspnea, fatigue, and dizziness in the setting of restarting his COPD and asthma medications. On exam, patient has increased work of breathing with decreased breath sounds and wheezing bilaterally. Patient endorses symptomatic improvement since receiving breathing treatments this morning.  Given patient's acute respiratory exacerbation last night, increased frequency of patient's DuoNebs to every 3 hours and started home Pulmicort  and Brovana .  Also started azithromycin  for inflammation related to COPD exacerbation and BiPAP as needed for breathing comfort. - continue prednisone  40 mg day 2/5 - continue home Duoneb q3h - continue home montelukast  10 mg daily - Hold home Breztri  - start home Pulmicort  - Continue azithromycin  500  mg daily for 3 doses - Restart appropriate home medications as needed - BiPAP PRN - Start Brovana  nebulizer and 50 mcg 2 times daily   Chest wall Pain cough Patient continues to endorse chest wall pain, likely pleuritic in nature given his associated cough and similar symptomology from previous hospitalizations.  Patient with nonconcerning EKG and troponin, ACS workup in ED was negative and low concern for acute event at this time.  Will continue to monitor and adjust pain regimen, likely chest pain will improve with cough control. - Continue lidocaine  patch daily as needed - Start Voltaren  gel daily as needed - Schedule Tylenol  1000 mg 3 times daily - Tussionex 5 mL  suspension, twice daily - Start guaifenesin  dextromethorphan  5 mL every 4 hours as needed  Azotemia Hyperkalemia Patient with potassium of 3.5 on admission, then 6.1 the following day and repeat is 6.0.  Subsequently improved to 4.8 in the afternoon after 1 L normal saline bolus and one-time dose of Lokelma . Elevated BUN at 75 this morning from 14 on admission, and creatinine elevated to 1.06 from 0.63 on admission as well.  Differential for this presentation includes hypovolemia or occult GI bleed.  Patient is status post 1 L fluid, BUN still 75 so we will we will continue to workup other causes of his presentation. - s/p 1L 0.9% NS IVF - Hold home Jardiance  in the setting of hyperkalemia  Unspecified anemia Patient reported few weeks long history of dizziness, and endorses feeling like the room is spinning in the ER.  Patient denies weakness, hematochezia or melena.  Patient's hemoglobin on admission is 9.2, and recheck in the morning of 8.2.  Patient received 1 L IV fluid this morning, and hemoglobin dropped to 6.9.  This could be related to the IVF he just received, though patient consistently with dropping hemoglobin, and BUN did not improve with fluids, giving concern for occult GI bleed, GI consulted.  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  We performed rectal exam with empty vault, negative for active bleeding and Hemoccult negative as well. - consult GI - follow H&H - hemoccult negative - Orthostatic vitals within normal limits - Start IV Protonix  40 mg daily  - s/p 1 unit PRBC - Discontinue DVT prophylaxis  T2DM Diabetic neuropathy Patient has T2DM with a recent A1C of 7.6 in Nov 2025, currently on 20 units of long acting insulin , trulicity , synjardy , and actos . Patient reports taking most of these medications outside of trulicity  after recent surgery. Patient also has diabetic neuropathy managed with Lyrica . He has a history of toe amputations on his left foot.  CBGs elevated as expected since starting steroids, patient fasting CBG this morning of 258, will adjust insulin  regimen as needed while admitted, increases long-acting dose and adjust SSI to resistant scale. - adjust SSI to resistant scale - increase to 25 units of lantus  daily - continue home Lyrica  100 mg tid - hold home oral synjardy  12.5-100mg  daily - Hold home trulicity    Osteoarthritis of right knee Patient has recent history of total right knee arthoplasty approximately 6 weeks ago. Patient currently not experiencing any symptoms but has been on NSAIDs for pain.  Will discontinue patient's oral naproxen  right now given unclear etiology of decreasing hemoglobin levels. - Continue to monitor - discontinue oral naproxen  500 mg prn q12h for mild pain   HTN CAD Moderate AS/MS Patient has a history of CAD and moderate AS/MS. Patient is currently experiencing chest pain with cough only, BMP WNL, declining troponins, and non-concerning EKGs. Etiology of chest pain is likely pleuritic given association with cough. We will continue to monitor and restart patient on appropriate home medications. - continue home oral  atorvastatin  40 mg daily - continue home oral Plavix  75 mg daily  Leukopenia, resolved On admission to the ED, patient CBC shows mild leukopenia, WBC of 3.6, in the setting of week long illness. Patient is currently prescribed inhaled corticosteroids for his asthma/COPD, but no other suppressive medications. In the setting of his URI, leukocytosis would be expected. Most likely etiology of mild leukopenia  is normal variation, especially since patient has recent history of WBC in the normal range. WBC on repeat BMP has improved to 10.4. - Continue to monitor  Best Practice: Diet: Regular diet IVF: Fluids: 0.9NS, Rate: once VTE: Place and maintain sequential compression device Start: 10/23/24 1731 Code: Full  Disposition planning: Therapy Recs: Outpatient PT Family Contact: Fianc, at bedside. DISPO: Anticipated discharge in 1-3 days to Home pending further workup.  Signature: Mliss Arlean Pouch, DO [facility]  5:35 PM, 10/23/2024  On Call pager [phone]  "

## 2024-10-23 NOTE — Progress Notes (Signed)
" °   10/23/24 0212  Vitals  Temp 98.3 F (36.8 C)  Temp Source Oral  BP 117/60  MAP (mmHg) 77  BP Location Right Arm  BP Method Automatic  Patient Position (if appropriate) Lying  Pulse Rate 97  Pulse Rate Source Monitor  ECG Heart Rate 96  Resp 17  Level of Consciousness  Level of Consciousness Alert  MEWS COLOR  MEWS Score Color Green  Oxygen  Therapy  SpO2 100 %  O2 Device Nasal Cannula  O2 Flow Rate (L/min) 2 L/min  Pain Assessment  Pain Scale 0-10  Pain Score 10  Pain Type Acute pain  Pain Location Chest  Pain Descriptors / Indicators Tightness  Pain Frequency Constant  Glasgow Coma Scale  Eye Opening 4  Best Verbal Response (NON-intubated) 5  Best Motor Response 6  Glasgow Coma Scale Score 15  MEWS Score  MEWS Temp 0  MEWS Systolic 0  MEWS Pulse 0  MEWS RR 0  MEWS LOC 0  MEWS Score 0   Patient arrived to unit from 2W.  Pt visibly in respiratory distress, shortness of breath and audible expiratory wheezing noted, O2 placed on pt at 2 LPM via [place] with a Spo2 of 100 %, HOB elevated for increased lung expansion with respirations, vitals taken, CCMD called, call light within reach. "

## 2024-10-23 NOTE — Progress Notes (Signed)
" °   10/23/24 2108  BiPAP/CPAP/SIPAP  Reason BIPAP/CPAP not in use Non-compliant (Pt. refused. Pt. educated on why to wear machine.)  BiPAP/CPAP /SiPAP Vitals  Temp 98.4 F (36.9 C)  Pulse Rate 94  Resp 18  BP (!) 114/59  SpO2 100 %  MEWS Score/Color  MEWS Score 0  MEWS Score Color Green    "

## 2024-10-23 NOTE — Evaluation (Signed)
 Occupational Therapy Evaluation Patient Details Name: Richard [name] MRN: [id number] DOB: 1961/05/18 Today's Date: 10/23/2024   History of Present Illness   64 y.o. male presents to [facility] 10/22/24 with acute exacerbation of COPD with asthma. PMH includes COPD, asthma, HTN, HLD, CAD, T2DM, recent L transmetatarsal amputation (09/18/23), R TKA     Clinical Impressions Pt lives with his significant other who can assist PRN. He typically walks with a cane or no device and is participating in OPPT. Pt is typically independent self care and is assisted for transportation. Pt presents symptomatic orthostatic hypotension, see flow sheet. RN notified. Pt unable to progress beyond brief standing. Pt is mod I for bed mobility and stands with min assist. Pt likely to progress well and not require post acute OT once medical issues have resolved. Will follow acutely.      If plan is discharge home, recommend the following:   A little help with walking and/or transfers;A little help with bathing/dressing/bathroom;Assist for transportation;Help with stairs or ramp for entrance     Functional Status Assessment   Patient has had a recent decline in their functional status and demonstrates the ability to make significant improvements in function in a reasonable and predictable amount of time.     Equipment Recommendations   Tub/shower bench     Recommendations for Other Services         Precautions/Restrictions   Precautions Precautions: Fall Recall of Precautions/Restrictions: Intact Precaution/Restrictions Comments: watch for orthostatic hypotension Restrictions Weight Bearing Restrictions Per Provider Order: No     Mobility Bed Mobility Overal bed mobility: Modified Independent                  Transfers Overall transfer level: Needs assistance Equipment used: 1 person hand held assist Transfers: Sit to/from Stand Sit to Stand: Min assist           General  transfer comment: pt immediately dizzy and off balance upon standing, returned to sitting      Balance Overall balance assessment: Needs assistance   Sitting balance-Leahy Scale: Good     Standing balance support: Single extremity supported Standing balance-Leahy Scale: Poor                             ADL either performed or assessed with clinical judgement   ADL Overall ADL's : Needs assistance/impaired Eating/Feeding: Independent;Bed level   Grooming: Supervision/safety;Sitting   Upper Body Bathing: Supervision/ safety;Sitting   Lower Body Bathing: Moderate assistance;Sit to/from stand   Upper Body Dressing : Set up;Sitting   Lower Body Dressing: Moderate assistance;Sit to/from stand                       Vision Ability to See in Adequate Light: 0 Adequate Patient Visual Report: No change from baseline       Perception         Praxis         Pertinent Vitals/Pain Pain Assessment Pain Assessment: No/denies pain     Extremity/Trunk Assessment Upper Extremity Assessment Upper Extremity Assessment: Overall WFL for tasks assessed;Right hand dominant   Lower Extremity Assessment Lower Extremity Assessment: Defer to PT evaluation LLE Deficits / Details: prior TMA   Cervical / Trunk Assessment Cervical / Trunk Assessment: Normal   Communication Communication Communication: No apparent difficulties   Cognition Arousal: Alert Behavior During Therapy: WFL for tasks assessed/performed Cognition: No apparent impairments  Following commands: Intact       Cueing  General Comments   Cueing Techniques: Verbal cues      Exercises     Shoulder Instructions      Home Living Family/patient expects to be discharged to:: Private residence Living Arrangements: Spouse/significant other Available Help at Discharge: Family;Available PRN/intermittently Type of Home: House Home Access: Stairs to  enter [employer] of Steps: 1 at back - no rails--- 3 at front-- 2 rails Entrance Stairs-Rails: None Home Layout: Two level;1/2 bath on main level;Bed/bath upstairs Alternate Level Stairs-Number of Steps: flight Alternate Level Stairs-Rails: Right Bathroom Shower/Tub: Chief Strategy Officer: Standard Bathroom Accessibility: Yes   Home Equipment: Cane - single point;Wheelchair [employer] (4 wheels);Rolling Walker (2 wheels)          Prior Functioning/Environment Prior Level of Function : Needs assist             Mobility Comments: Has been using no AD when in therapy. Use of SP cane or rollator when fatigued/SOB. Has been going to OP PT ADLs Comments: s/o assists with transportation    OT Problem List: Impaired balance (sitting and/or standing)   OT Treatment/Interventions: Self-care/ADL training;DME and/or AE instruction;Therapeutic activities;Patient/family education;Balance training      OT Goals(Current goals can be found in the care plan section)   Acute Rehab OT Goals OT Goal Formulation: With patient Time For Goal Achievement: 11/06/24 Potential to Achieve Goals: Good ADL Goals Pt Will Perform Grooming: with modified independence;standing Pt Will Perform Lower Body Bathing: with modified independence;sit to/from stand Pt Will Perform Lower Body Dressing: with modified independence;sit to/from stand Pt Will Transfer to Toilet: with modified independence;ambulating Pt Will Perform Toileting - Clothing Manipulation and hygiene: with modified independence;sit to/from stand Pt Will Perform Tub/Shower Transfer: Tub transfer;with supervision;ambulating;tub bench (pt will be aware of tub transfer bench for safe showering)   OT Frequency:  Min 2X/week    Co-evaluation              AM-PAC OT 6 Clicks Daily Activity     Outcome Measure Help from another person eating meals?: None Help from another person taking care of personal  grooming?: A Little Help from another person toileting, which includes using toliet, bedpan, or urinal?: A Little Help from another person bathing (including washing, rinsing, drying)?: A Lot Help from another person to put on and taking off regular upper body clothing?: A Little Help from another person to put on and taking off regular lower body clothing?: A Lot 6 Click Score: 17   End of Session Equipment Utilized During Treatment: Gait belt Nurse Communication: Other (comment) (aware of BP)  Activity Tolerance: Treatment limited secondary to medical complications (Comment) (orthostatic hypotension) Patient left: in bed;with call bell/phone within reach  OT Visit Diagnosis: Unsteadiness on feet (R26.81);Dizziness and giddiness (R42)                Time: 8878-8864 OT Time Calculation (min): 14 min Charges:  OT General Charges $OT Visit: 1 Visit OT Evaluation $OT Eval Moderate Complexity: 1 Mod  Mliss HERO, OTR/L Acute Rehabilitation Services Office: [phone]   Kennth Mliss Helling 10/23/2024, 11:43 AM

## 2024-10-23 NOTE — Progress Notes (Addendum)
 Pt called around 12:10am for breathing treatment. RT Rosina called but stated she was with another pt in distress but will come. After examination, pt noted to be in severe respiratory distress with audible wheezing, unable to speak. HR tachy in 110's. O2 sats at 100% but supplemental O2 added at [facility]. Duoneb given (see chart). RT called again to inform breathing tx given but RT still being requested to bedside. RT stated she is coming. Pt breathing improved. Pt then started having coughing attack and WOB increased. Dr. Azadegan and Dr. Laurita notified at 12:30. RRT Alm called to bedside at 1240. RRT and Dr. Azadegan at bedside. Pt in distress and tripoding, unable to speak. Pulmicort  ordered and given with slight improvement. Pt still having increased WOB with audible wheezing. Another duoneb given (see chart) with improvement. Pt down to [facility]. Rapid and RT and this RN agreeable that pt may need Bipap. Pt ordered to higher level of care. Report called at 0143 to [place], [place]. Pt transferred to 4E11 around 0200on [facility]. Pt still having labored breathing  and audible wheezing but alert and oriented x4.

## 2024-10-23 NOTE — Evaluation (Signed)
 Physical Therapy Evaluation Patient Details Name: Richard [name] MRN: [id number] DOB: Oct 12, 1960 Today's Date: 10/23/2024  History of Present Illness  64 y.o. male presents to [facility] 10/22/24 with acute exacerbation of COPD with asthma. PMH includes COPD, asthma, HTN, HLD, CAD, T2DM, recent L transmetatarsal amputation (09/18/23), R TKA  Clinical Impression  PTA pt was mostly independent for mobility with no AD with occasional use of SP cane/rollator when fatigued or feeling SOB. Mobility was limited this date due to pt feeling dizzy/lightheaded with vitals below. Pt was ModI for bed mobility and MinA/CGA to stand with use of RW. Able to ambulate ~34ft with CGA and RW before pt needed to return to seated position due to symptoms. Would recommend taking orthostatic BP readings in future sessions. Pt has intermittent assist available upon d/c home. Recommend pt continues working with OP PT with acute PT to follow.   Seated BP after mobility- 101/43 (61), 101 BPM Supine BP after mobility- 92/54 (66), 106 BPM  100% SpO2 on RA        If plan is discharge home, recommend the following: Assist for transportation;Help with stairs or ramp for entrance;A little help with walking and/or transfers;A little help with bathing/dressing/bathroom   Can travel by private vehicle    Yes    Equipment Recommendations None recommended by PT     Functional Status Assessment Patient has had a recent decline in their functional status and demonstrates the ability to make significant improvements in function in a reasonable and predictable amount of time.     Precautions / Restrictions Precautions Precautions: Fall Recall of Precautions/Restrictions: Intact Precaution/Restrictions Comments: watch BP, orthostatic? Restrictions Weight Bearing Restrictions Per Provider Order: No      Mobility  Bed Mobility Overal bed mobility: Modified Independent     Transfers Overall transfer level: Needs  assistance Equipment used: Rolling walker (2 wheels) Transfers: Sit to/from Stand Sit to Stand: Contact guard assist, Min assist    General transfer comment: MinA for initial stand for boost-up with cues for hand placement with RW. CGA for subsequent stand for safety    Ambulation/Gait Ambulation/Gait assistance: Contact guard assist Gait Distance (Feet): 8 Feet Assistive device: Rolling walker (2 wheels) Gait Pattern/deviations: Step-through pattern, Decreased stride length Gait velocity: decr    General Gait Details: guarded gait pattern with heavy reliance on UE support. Reported feeling slightly lightheaded/dizzy with further movement deferred     Balance Overall balance assessment: Needs assistance, Mild deficits observed, not formally tested Sitting-balance support: No upper extremity supported, Feet supported Sitting balance-Leahy Scale: Good     Standing balance support: Bilateral upper extremity supported, During functional activity, Reliant on assistive device for balance Standing balance-Leahy Scale: Poor Standing balance comment: reliant on UE support and CGA       Pertinent Vitals/Pain Pain Assessment Pain Assessment: Faces Faces Pain Scale: Hurts little more Pain Location: chest Pain Descriptors / Indicators: Discomfort, Tightness Pain Intervention(s): Limited activity within patient's tolerance, Monitored during session, Repositioned    Home Living Family/patient expects to be discharged to:: Private residence Living Arrangements: Spouse/significant other Available Help at Discharge: Family;Available PRN/intermittently Type of Home: House Home Access: Stairs to enter Entrance Stairs-Rails: None Entrance Stairs-Number of Steps: 1 at back - no rails--- 3 at front-- 2 rails Alternate Level Stairs-Number of Steps: flight Home Layout: Two level;1/2 bath on main level;Bed/bath upstairs Home Equipment: Cane - single point;Wheelchair Financial Trader (4  wheels);Rolling Walker (2 wheels)      Prior Function Prior Level of Function :  Needs assist    Mobility Comments: Has been using no AD when in therapy. Use of SP cane or rollator when fatigued/SOB. Has been going to OP PT ADLs Comments: s/o assists with transportation     Extremity/Trunk Assessment   Upper Extremity Assessment Upper Extremity Assessment: Defer to OT evaluation    Lower Extremity Assessment Lower Extremity Assessment: LLE deficits/detail LLE Deficits / Details: prior TMA    Cervical / Trunk Assessment Cervical / Trunk Assessment: Normal  Communication   Communication Communication: No apparent difficulties    Cognition Arousal: Alert Behavior During Therapy: WFL for tasks assessed/performed   PT - Cognitive impairments: No apparent impairments    Following commands: Intact       Cueing Cueing Techniques: Verbal cues      PT Assessment Patient needs continued PT services  PT Problem List Decreased strength;Decreased activity tolerance;Decreased balance;Decreased mobility;Cardiopulmonary status limiting activity       PT Treatment Interventions DME instruction;Gait training;Stair training;Functional mobility training;Therapeutic activities;Neuromuscular re-education;Balance training;Therapeutic exercise;Patient/family education    PT Goals (Current goals can be found in the Care Plan section)  Acute Rehab PT Goals Patient Stated Goal: to be able to dance at his wedding PT Goal Formulation: With patient Time For Goal Achievement: 11/06/24 Potential to Achieve Goals: Good    Frequency Min 2X/week        AM-PAC PT 6 Clicks Mobility  Outcome Measure Help needed turning from your back to your side while in a flat bed without using bedrails?: None Help needed moving from lying on your back to sitting on the side of a flat bed without using bedrails?: None Help needed moving to and from a bed to a chair (including a wheelchair)?: A Little Help  needed standing up from a chair using your arms (e.g., wheelchair or bedside chair)?: A Little Help needed to walk in hospital room?: Total Help needed climbing 3-5 steps with a railing? : Total 6 Click Score: 16    End of Session   Activity Tolerance: Other (comment) (limited by dizziness/lightheadedness) Patient left: in bed;with call bell/phone within reach;with bed alarm set Nurse Communication: Mobility status;Other (comment) (BP readings and symptoms) PT Visit Diagnosis: Unsteadiness on feet (R26.81);Other abnormalities of gait and mobility (R26.89);Muscle weakness (generalized) (M62.81)    Time: 9175-9146 PT Time Calculation (min) (ACUTE ONLY): 29 min   Charges:   PT Evaluation $PT Eval Low Complexity: 1 Low PT Treatments $Therapeutic Activity: 8-22 mins PT General Charges $$ ACUTE PT VISIT: 1 Visit       Kate ORN, PT, DPT Secure Chat Preferred  Rehab Office [phone]   Kate BRAVO Wendolyn 10/23/2024, 9:32 AM

## 2024-10-23 NOTE — Significant Event (Signed)
 Rapid Response Event Note   Reason for Call : Respiratory distress  Initial Focused Assessment:  I was called to bedside for pt in distress. Labored breathing, wheezing and coughing. Duoneb previously given. Additional pulmocort and Duoneb given. WOB improved following nebs.   0032-98.12F, HR 103, 116/56, RR 18 with sats 100% on 6L [place]   Interventions:  -Pulmocort  -Duoneb   MD Notified: Dr. Azadegan to bedside Call Time: 0040 Arrival Time: 0043 End Time: 0140  Griselda Alm ORN, RN

## 2024-10-23 NOTE — Progress Notes (Addendum)
 At approximately 12:30 AM, received notification that the patient was experiencing another episode of respiratory exacerbation about 2 hours after transfer from the ED. The patient had severe shortness of breath and was initially unable to speak. He reported using Breztri  inhaler at home.  PE: On evaluation, the patient was sitting upright and leaning forward in acute respiratory distress with audible wheezing. He was on 10 L nasal cannula with O2 saturation 100%. Lungs revealed diffuse wheezing throughout all lung fields. Cardiovascular exam showed tachycardia with intact pulses.  Interventions: DuoNeb and Pulmicort  nebulizer treatments were administered with significant improvement and supplemental oxygen  down to 2 L. The patient became able to speak, though he continued to have persistent wheezing and shortness of breath. Rapid Response Team and Respiratory Therapy were notified due to concern for possible need for BiPAP.  Acute Asthma/COPD exacerbation with incomplete response to initial nebulizer therapy.  Plan: - Continue DuoNeb Q4h - Start Pulmicort  nebulizer - Start Brovana  nebulizer  - Obtain VBG - Transfer to progressive level of care - BiPAP PRN  -------------------------------------------------------------------------------------------------- 5:30 AM  The patient was reassessed. Oxygen  saturation was 100% on room air with improved work of breathing. Wheezing had improved, and the patient was able to speak in full sentences. He reported productive cough with sputum, which improved after administration of Robitussin. The patient also complained of lack of sleep and chest wall pain. He was initially given naproxen  and ketorolac  for pain control.  VBG showed CO? 38, bicarbonate 23, and pH 7.39.  Initial BMP revealed potassium 6.1 (up from 3.5), sodium 128 (down from 140), and BUN 62 (up from 14). EKG was reviewed and showed no abnormalities. Given concern for AKI with elevated BUN  and hyperkalemia, naproxen  and ketorolac  were discontinued.  Repeat BMP showed: sodium 127, potassium 6.0, chloride 97, CO2 20, glucose 267, BUN 75, creatinine 1.06, and anion gap 10. Hyperkalemia management was initiated with Lokelma , insulin , and non-urgent calcium  gluconate.  Hyperkalemia and elevated BUN may be due to pre-renal azotemia from volume depletion, occult GI bleeding, early intrinsic AKI ( ATN or NSAID-related nephropathy).  Given ongoing chest wall pain, elevated BUN, and a mild drop in hemoglobin, evaluation for possible bleeding was considered warranted.

## 2024-10-24 DIAGNOSIS — K3189 Other diseases of stomach and duodenum: Secondary | ICD-10-CM | POA: Diagnosis not present

## 2024-10-24 DIAGNOSIS — Z7902 Long term (current) use of antithrombotics/antiplatelets: Secondary | ICD-10-CM

## 2024-10-24 DIAGNOSIS — K5900 Constipation, unspecified: Secondary | ICD-10-CM | POA: Diagnosis not present

## 2024-10-24 DIAGNOSIS — D500 Iron deficiency anemia secondary to blood loss (chronic): Secondary | ICD-10-CM

## 2024-10-24 LAB — CBC
HCT: 22.8 % — ABNORMAL LOW (ref 39.0–52.0)
Hemoglobin: 7.4 g/dL — ABNORMAL LOW (ref 13.0–17.0)
MCH: 26.5 pg (ref 26.0–34.0)
MCHC: 32.5 g/dL (ref 30.0–36.0)
MCV: 81.7 fL (ref 80.0–100.0)
Platelets: 229 K/uL (ref 150–400)
RBC: 2.79 MIL/uL — ABNORMAL LOW (ref 4.22–5.81)
RDW: 14.9 % (ref 11.5–15.5)
WBC: 10 K/uL (ref 4.0–10.5)
nRBC: 0 % (ref 0.0–0.2)

## 2024-10-24 LAB — COMPREHENSIVE METABOLIC PANEL WITH GFR
ALT: 12 U/L (ref 0–44)
AST: 11 U/L — ABNORMAL LOW (ref 15–41)
Albumin: 3.4 g/dL — ABNORMAL LOW (ref 3.5–5.0)
Alkaline Phosphatase: 84 U/L (ref 38–126)
Anion gap: 10 (ref 5–15)
BUN: 65 mg/dL — ABNORMAL HIGH (ref 8–23)
CO2: 21 mmol/L — ABNORMAL LOW (ref 22–32)
Calcium: 8.6 mg/dL — ABNORMAL LOW (ref 8.9–10.3)
Chloride: 103 mmol/L (ref 98–111)
Creatinine, Ser: 1.11 mg/dL (ref 0.61–1.24)
GFR, Estimated: >60 mL/min
Glucose, Bld: 125 mg/dL — ABNORMAL HIGH (ref 70–99)
Potassium: 4.3 mmol/L (ref 3.5–5.1)
Sodium: 134 mmol/L — ABNORMAL LOW (ref 135–145)
Total Bilirubin: <0.2 mg/dL (ref 0.0–1.2)
Total Protein: 5.8 g/dL — ABNORMAL LOW (ref 6.5–8.1)

## 2024-10-24 LAB — LACTATE DEHYDROGENASE: LDH: 128 U/L (ref 105–235)

## 2024-10-24 LAB — RETICULOCYTES
Immature Retic Fract: 41.4 % — ABNORMAL HIGH (ref 2.3–15.9)
RBC.: 2.79 MIL/uL — ABNORMAL LOW (ref 4.22–5.81)
Retic Count, Absolute: 70.9 K/uL (ref 19.0–186.0)
Retic Ct Pct: 2.5 % (ref 0.4–3.1)

## 2024-10-24 LAB — HEMOGLOBIN AND HEMATOCRIT, BLOOD
HCT: 22.6 % — ABNORMAL LOW (ref 39.0–52.0)
Hemoglobin: 7.2 g/dL — ABNORMAL LOW (ref 13.0–17.0)

## 2024-10-24 LAB — TECHNOLOGIST SMEAR REVIEW: Plt Morphology: NORMAL

## 2024-10-24 LAB — GLUCOSE, CAPILLARY
Glucose-Capillary: 121 mg/dL — ABNORMAL HIGH (ref 70–99)
Glucose-Capillary: 166 mg/dL — ABNORMAL HIGH (ref 70–99)
Glucose-Capillary: 312 mg/dL — ABNORMAL HIGH (ref 70–99)

## 2024-10-24 LAB — PREPARE RBC (CROSSMATCH)

## 2024-10-24 MED ORDER — SODIUM CHLORIDE 0.9% IV SOLUTION: Freq: Once | INTRAVENOUS | Status: AC

## 2024-10-24 MED ORDER — POLYETHYLENE GLYCOL 3350 17 G PO PACK
17.0000 g | PACK | Freq: Once | ORAL | Status: AC
  Administered 2024-10-24: 17 g via ORAL
  Filled 2024-10-24: qty 1

## 2024-10-24 MED ORDER — POLYETHYLENE GLYCOL 3350 17 GM/SCOOP PO POWD
119.0000 g | Freq: Once | ORAL | Status: AC
  Administered 2024-10-25: 119 g via ORAL
  Filled 2024-10-24: qty 119

## 2024-10-24 MED ORDER — ALBUTEROL SULFATE (2.5 MG/3ML) 0.083% IN NEBU
20.0000 mg | INHALATION_SOLUTION | Freq: Once | RESPIRATORY_TRACT | Status: AC
  Administered 2024-10-24: 20 mg via RESPIRATORY_TRACT
  Filled 2024-10-24 (×2): qty 24

## 2024-10-24 MED ORDER — ALBUTEROL SULFATE (2.5 MG/3ML) 0.083% IN NEBU: 2.5000 mg | INHALATION_SOLUTION | Freq: Once | RESPIRATORY_TRACT | Status: DC

## 2024-10-24 MED ORDER — IPRATROPIUM-ALBUTEROL 0.5-2.5 (3) MG/3ML IN SOLN
3.0000 mL | RESPIRATORY_TRACT | Status: DC
  Administered 2024-10-24 – 2024-10-25 (×4): 3 mL via RESPIRATORY_TRACT
  Filled 2024-10-24 (×5): qty 3

## 2024-10-24 MED ORDER — POLYETHYLENE GLYCOL 3350 17 G PO PACK
17.0000 g | PACK | Freq: Two times a day (BID) | ORAL | Status: DC
  Administered 2024-10-24 – 2024-10-28 (×6): 17 g via ORAL
  Filled 2024-10-24 (×8): qty 1

## 2024-10-24 MED ORDER — INSULIN GLARGINE 100 UNIT/ML ~~LOC~~ SOLN
13.0000 [IU] | Freq: Every day | SUBCUTANEOUS | Status: DC
  Administered 2024-10-24 – 2024-10-25 (×2): 13 [IU] via SUBCUTANEOUS
  Filled 2024-10-24 (×3): qty 0.13

## 2024-10-24 MED ORDER — EMPAGLIFLOZIN 25 MG PO TABS
25.0000 mg | ORAL_TABLET | Freq: Every day | ORAL | Status: DC
  Administered 2024-10-24 – 2024-10-28 (×5): 25 mg via ORAL
  Filled 2024-10-24 (×5): qty 1

## 2024-10-24 NOTE — Progress Notes (Signed)
 Physical Therapy Treatment Patient Details Name: Richard [name] MRN: [id number] DOB: [birth year] Today's Date: 10/24/2024   History of Present Illness 65 y.o. male presents to [facility] 10/22/24 with acute exacerbation of COPD with asthma. PMH includes: COPD, asthma, HTN, HLD, CAD, DM II, L transmetatarsal amputation (09/18/23), R TKA (08/24/24).    PT Comments  The pt was agreeable to session, observed ambulating from bathroom to bed without DME at start of session, but does require UE support as pt reaching for furniture to support with all mobility. With RW, pt able to complete short bout of ambulation but is limited by onset of coughing fit requiring seated rest and SOB (SpO2 stable 97-100% on RA). The pt is making good progress with activity tolerance, will still need continued skilled PT to progress functional endurance to complete flight of stairs to access his bedroom at home.    If plan is discharge home, recommend the following: Assist for transportation;Help with stairs or ramp for entrance;A little help with walking and/or transfers;A little help with bathing/dressing/bathroom   Can travel by private vehicle        Equipment Recommendations  None recommended by PT    Recommendations for Other Services       Precautions / Restrictions Precautions Precautions: Fall Recall of Precautions/Restrictions: Intact Precaution/Restrictions Comments: watch BP Restrictions Weight Bearing Restrictions Per Provider Order: No     Mobility  Bed Mobility Overal bed mobility: Modified Independent                  Transfers Overall transfer level: Needs assistance Equipment used: None, Rolling walker (2 wheels) Transfers: Sit to/from Stand Sit to Stand: Contact guard assist, Min assist           General transfer comment: pt in bathroom and stood without supervision at start of session without DME, CGA with heavy dependence on UE support with RW     Ambulation/Gait Ambulation/Gait assistance: Contact guard assist Gait Distance (Feet): 15 Feet (+ 25 + 20 ft) Assistive device: None, Rolling walker (2 wheels) Gait Pattern/deviations: Step-through pattern, Decreased stride length Gait velocity: decr Gait velocity interpretation: <1.31 ft/sec, indicative of household ambulator   General Gait Details: reaching for furniture support throughout room from bathroom to bed initially, denies dizziness but reports limited by SOB, SpO2 97% on RA. onset of coughing fit when walking requiring seated rest. SpO2 and BP remained stable       Balance Overall balance assessment: Needs assistance, Mild deficits observed, not formally tested Sitting-balance support: No upper extremity supported, Feet supported Sitting balance-Leahy Scale: Good     Standing balance support: Bilateral upper extremity supported, During functional activity, Reliant on assistive device for balance Standing balance-Leahy Scale: Poor Standing balance comment: reliant on UE support and CGA                            Communication Communication Communication: No apparent difficulties  Cognition Arousal: Alert Behavior During Therapy: WFL for tasks assessed/performed   PT - Cognitive impairments: No apparent impairments                         Following commands: Intact      Cueing Cueing Techniques: Verbal cues  Exercises      General Comments General comments (skin integrity, edema, etc.): VSS on RA, BP 117/60 and 123/71 after ambulation      Pertinent Vitals/Pain Pain Assessment Pain Assessment: No/denies  pain Faces Pain Scale: Hurts a little bit Pain Location: chest Pain Descriptors / Indicators: Discomfort, Tightness Pain Intervention(s): Limited activity within patient's tolerance, Monitored during session    Home Living Family/patient expects to be discharged to:: Private residence Living Arrangements: Spouse/significant  other Available Help at Discharge: Family;Available PRN/intermittently Type of Home: House Home Access: Stairs to enter Entrance Stairs-Rails: None Entrance Stairs-Number of Steps: 1 at back - no rails--- 3 at front-- 2 rails Alternate Level Stairs-Number of Steps: flight Home Layout: Two level;1/2 bath on main level;Bed/bath upstairs Home Equipment: Cane - single point;Wheelchair Financial Trader (4 wheels);Rolling Walker (2 wheels)      Prior Function            PT Goals (current goals can now be found in the care plan section) Acute Rehab PT Goals Patient Stated Goal: to be able to dance at his wedding PT Goal Formulation: With patient Time For Goal Achievement: 11/06/24 Potential to Achieve Goals: Good Progress towards PT goals: Progressing toward goals    Frequency    Min 2X/week       AM-PAC PT 6 Clicks Mobility   Outcome Measure  Help needed turning from your back to your side while in a flat bed without using bedrails?: None Help needed moving from lying on your back to sitting on the side of a flat bed without using bedrails?: None Help needed moving to and from a bed to a chair (including a wheelchair)?: A Little Help needed standing up from a chair using your arms (e.g., wheelchair or bedside chair)?: A Little Help needed to walk in hospital room?: A Little Help needed climbing 3-5 steps with a railing? : A Lot 6 Click Score: 19    End of Session Equipment Utilized During Treatment: Gait belt Activity Tolerance: Patient tolerated treatment well;Other (comment) (limited by SOB) Patient left: in bed;with call bell/phone within reach (with RT doing breathing tx) Nurse Communication: Mobility status PT Visit Diagnosis: Unsteadiness on feet (R26.81);Other abnormalities of gait and mobility (R26.89);Muscle weakness (generalized) (M62.81)     Time: 8844-8789 PT Time Calculation (min) (ACUTE ONLY): 15 min  Charges:    $Therapeutic Exercise: 8-22 mins PT  General Charges $$ ACUTE PT VISIT: 1 Visit                     Izetta Call, PT, DPT   Acute Rehabilitation Department Office [phone] Secure Chat Communication Preferred   Izetta JULIANNA Call 10/24/2024, 12:28 PM

## 2024-10-24 NOTE — Progress Notes (Addendum)
 "  HD#2 SUBJECTIVE:  Patient Summary: Richard [name] is a 64 y.o. with a pertinent PMH of COPD, asthma, CAD, moderate AS/MS, HTN, and T2DM w/peripheral neuropathy who presented to the [facility] ED with dyspnea and cough and subsequently admitted for asthma exacerbation.    Overnight Events: No acute event overnight.  Had a restful sleep.  Interim History: Seen and examined at bedside. The patient reports improved breathing with intermittent cough and difficulty expectorating sputum. Last bowel movement was yesterday; stools were hard and non-bloody. Reports dizziness with ambulation to the bathroom.  GI evaluated the patient yesterday and ordered a CT abdomen and pelvis, which was unremarkable. Imaging noted constipation.  OBJECTIVE:  Vital Signs: Vitals:   10/24/24 0010 10/24/24 0338 10/24/24 0525 10/24/24 0807  BP:  106/60  (!) 106/50  Pulse:  81  89  Resp:  16  12  Temp:  98.1 F (36.7 C)  97.9 F (36.6 C)  TempSrc:  Oral  Oral  SpO2: 99% 98% 99% 99%  Weight:      Height:       Supplemental O2: Room Air SpO2: 99 % O2 Flow Rate (L/min): 2 L/min  Filed Weights   10/22/24 0921  Weight: 103 kg    Intake/Output Summary (Last 24 hours) at 10/24/2024 0915 Last data filed at 10/23/2024 1830 Gross per 24 hour  Intake 432 ml  Output 1725 ml  Net -1293 ml   Net IO Since Admission: -2,243 mL [10/24/24 0915]  Physical Exam: Constitutional: chronically ill-appearing elderly gentleman laying in hospital bed in minimal respiratory distress Cardiovascular: regular rate and rhythm, 3/6 crescendo-decrescendo systolic murmur Pulmonary/Chest: mildly increased work of breathing on room air, able to speak in full sentences, decreased breath sounds bilaterally, mild inspiratory wheezes bilateral lung fields.   Abdominal: soft, mild TTP in LUQ, mildly distended abdomen. Skin: warm and dry on exam, no edema noted Rectal exam: rectal vault negative, no apparent/active bleeding  visualized Psych: congruent affect and mood  Patient Lines/Drains/Airways Status     Active Line/Drains/Airways     Name Placement date Placement time Site Days   Peripheral IV 10/22/24 20 G Anterior;Distal;Left;Upper Arm 10/22/24  1409  Arm  2   Peripheral IV 10/23/24 20 G 1.88 Anterior;Proximal;Right Forearm 10/23/24  0907  Forearm  1   Wound 08/24/24 1403 Surgical Closed Surgical Incision Knee Right 08/24/24  1403  Knee  61           Pertinent labs and imaging:     Latest Ref Rng & Units 10/24/2024    3:20 AM 10/23/2024    8:33 PM 10/23/2024   11:10 AM  CBC  WBC 4.0 - 10.5 K/uL 10.0     Hemoglobin 13.0 - 17.0 g/dL 7.4  8.4  6.9   Hematocrit 39.0 - 52.0 % 22.8  25.8  21.5   Platelets 150 - 400 K/uL 229          Latest Ref Rng & Units 10/24/2024    3:20 AM 10/23/2024   11:10 AM 10/23/2024    5:17 AM  CMP  Glucose 70 - 99 mg/dL 874  707  732   BUN 8 - 23 mg/dL 65  75  75   Creatinine 0.61 - 1.24 mg/dL 8.88  8.82  8.93   Sodium 135 - 145 mmol/L 134  125  127   Potassium 3.5 - 5.1 mmol/L 4.3  4.8  6.0   Chloride 98 - 111 mmol/L 103  96  97  CO2 22 - 32 mmol/L 21  17  20    Calcium  8.9 - 10.3 mg/dL 8.6  8.6  8.7   Total Protein 6.5 - 8.1 g/dL 5.8     Total Bilirubin 0.0 - 1.2 mg/dL <9.7     Alkaline Phos 38 - 126 U/L 84     AST 15 - 41 U/L 11     ALT 0 - 44 U/L 12       CT ABDOMEN PELVIS W CONTRAST Result Date: 10/23/2024 EXAM: CT ABDOMEN AND PELVIS WITH CONTRAST 10/23/2024 06:40:52 PM TECHNIQUE: CT of the abdomen and pelvis was performed with the administration of 75 mL of iohexol  (OMNIPAQUE ) 350 MG/ML injection. Multiplanar reformatted images are provided for review. Automated exposure control, iterative reconstruction, and/or weight-based adjustment of the mA/kV was utilized to reduce the radiation dose to as low as reasonably achievable. COMPARISON: 08/22/2022 CLINICAL HISTORY: Anemia (Ped 0-17y); Anemia with 2 g drop in hemoglobin, over past 24 hours, on Lovenox  rule  out retroperitoneal hematoma or other intra-abdominal bleed. FINDINGS: LOWER CHEST: Trace pericardial effusion. LIVER: The liver is unremarkable. GALLBLADDER AND BILE DUCTS: Decompressed gallbladder. No radiopaque gallstones. No biliary ductal dilatation. SPLEEN: No acute abnormality. PANCREAS: No acute abnormality. ADRENAL GLANDS: No acute abnormality. KIDNEYS, URETERS AND BLADDER: Small left upper pole renal cyst. No stones in the kidneys or ureters. No hydronephrosis. No perinephric or periureteral stranding. The urinary bladder is partially distended, without focal abnormality. Per consensus, no follow-up is needed for simple Bosniak type 1 and 2 renal cysts, unless the patient has a malignancy history or risk factors. GI AND BOWEL: Heterogeneous material in the stomach, likely ingested material. Moderate amount of hyperdensity, well formed stool throughout the colon. The appendix was not visualized. No pericecal or right lower quadrant inflammatory stranding to suggest acute appendicitis. There is no bowel obstruction. PERITONEUM AND RETROPERITONEUM: No ascites. No free air. No free pelvic fluid. VASCULATURE: Aorta is normal in caliber. Diffuse aortoiliac atherosclerosis. LYMPH NODES: No lymphadenopathy. REPRODUCTIVE ORGANS: No prostatomegaly. No acute abnormality. BONES AND SOFT TISSUES: Multilevel degenerative disc disease of the spine. Severe osteoarthritis of the left hip. Mild to moderate right hip osteoarthritis. No acute osseous abnormality. No focal soft tissue abnormality. IMPRESSION: 1. No acute intraabdominal or pelvic abnormality. No retroperitoneal hematoma. 2. Moderate amount of hyperdense, well-formed stool throughout the colon , which may reflect constipation and occult blood products. Fecal hemoccult testing may be of benefit as clinically warranted. Electronically signed by: Rogelia Myers MD 10/23/2024 07:02 PM EST RP Workstation: [id number]     ASSESSMENT/PLAN:  Assessment: Principal  Problem:   Acute exacerbation of COPD with asthma (HCC) Active Problems:   Asthma-COPD overlap syndrome (HCC)   Diabetic neuropathy, painful (HCC)   Acute on chronic anemia   Anterior chest wall pain   S/P TKR (total knee replacement), right   DM (diabetes mellitus), type 2 (HCC)   Leukopenia   Azotemia   Electrolyte imbalance (hyperkalemia, hyponatremia)  Richard [name] is a 64 y.o. person with a PMH of COPD, asthma, CAD, moderate AS/MS, HTN, and T2DM w/peripheral neuropathy who presented to the [facility] ED with dyspnea and cough and subsequently admitted for asthma/COPD exacerbation, he was  also found to have acute on chronic anemia.  Plan: #Asthma-COPD overlap syndrome #Acute exacerbation of COPD with asthma #Asthma exacerbation Improving, HDS on room air, treating asthma/COPD exacerbation with azithromycin , steroid, scheduled DuoNeb and home inhalers.  Still gets intermittent bouts of cough with trouble expectorating, he has tussionex, which helps  somewhat.  - Continue home Duoneb Q3hrs. - Con't Pulmicort  and Brovana  - Continue home montelukast  10 mg daily - Continue azithromycin  500 mg 2/3  Acute on chronic normocytic anemia Concern for UGI Yesterday, the patient received 1 unit PRBC; post-transfusion H&H 8.4, with Hb trending to 7.4 this morning. Reports dizziness with ambulation; denies hematemesis or melenic stools. Orthostatics negative; dizziness likely 2/2 anemia. Plavix  and Lovenox  held 2/2 anemia. 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  Acute anemia likely 2/2 upper GI bleed. Hemoccult negative. GI following; CT ABP negative for retroperitoneal hematoma. Low suspicion for hemolysis (normal smear, LDH, haptoglobin).  N.p.o. at midnight for capsule endoscopy to assess for small bowel AVMs. -Will transfuse with additional 1 unit PRBC - GI consulted, appreciate recs. - Continue IV Protonix  40 mg  BID. - Monitor CBC - Holding Lovenox  and clopidogrel  as above.  # Chest wall Pain # Cough Chest pain secondary to recurrent bouts of cough, improving. - Continue lidocaine  patch, and Voltaren  gel PRN, scheduled Tylenol  1000 mg TID. - Continue Tussionex 5 mL suspension BID and Mucinex  DM 5 mL Q4hrs.  Azotemia Hyperkalemia, resolved Azotemia improving; hyperkalemia resolved. Etiology unclear, with acute hemolysis workup unremarkable. Given negative orthostatics and low concern for dehydration, will resume Jardiance .  Stable kidney function. - Monitor BMP - Resumed home Jardiance .  T2DM Diabetic neuropathy Blood glucose improved; fasting 120, postprandial 260-290.  He is on Semglee  25 units daily. - On SSI; resistant scale. - Will decrease Semglee  to 13 units; given his n.p.o. status. - CW Lyrica  100 mg tid - CW metformin  1000 mg, and empagliflozin  25 mg. - Hold home trulicity   # Constipation -Will treat with scheduled MiraLAX , increase dose to BID if needed.   Osteoarthritis of right knee S/p right total knee arthroplasty 6 weeks ago, knee pain much better controlled now. - Continue to monitor  HTN CAD Moderate AS/MS Patient has a history of CAD and moderate AS/MS. low suspicion for ACS, given normal troponin, EKG without ischemic changes.  Ongoing chest pain is secondary to recurrent bouts of coughing. - continue home oral atorvastatin  40 mg daily - continue home oral Plavix  75 mg daily  Best Practice: Diet: N.p.o. at midnight IVF: Fluids: 0.9NS, Rate: once VTE: Place and maintain sequential compression device Start: 10/23/24 1731 Code: Full  Disposition planning: Therapy Recs: Outpatient PT Family Contact: Fianc, at bedside. DISPO: Anticipated discharge in 1-3 days to Home pending further workup.  Signature: Granite Godman, M.D.  Internal Medicine Resident, PGY-2  9:16 AM, 10/24/2024  "
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
## 2024-10-24 NOTE — Plan of Care (Signed)
  Problem: Education: Goal: Ability to describe self-care measures that may prevent or decrease complications (Diabetes Survival Skills Education) will improve Outcome: Progressing Goal: Individualized Educational Video(s) Outcome: Progressing   Problem: Coping: Goal: Ability to adjust to condition or change in health will improve Outcome: Progressing   Problem: Fluid Volume: Goal: Ability to maintain a balanced intake and output will improve Outcome: Progressing   Problem: Health Behavior/Discharge Planning: Goal: Ability to identify and utilize available resources and services will improve Outcome: Progressing Goal: Ability to manage health-related needs will improve Outcome: Progressing   Problem: Metabolic: Goal: Ability to maintain appropriate glucose levels will improve Outcome: Progressing   Problem: Nutritional: Goal: Maintenance of adequate nutrition will improve Outcome: Progressing Goal: Progress toward achieving an optimal weight will improve Outcome: Progressing   Problem: Skin Integrity: Goal: Risk for impaired skin integrity will decrease Outcome: Progressing   Problem: Tissue Perfusion: Goal: Adequacy of tissue perfusion will improve Outcome: Progressing   Problem: Education: Goal: Knowledge of General Education information will improve Description: Including pain rating scale, medication(s)/side effects and non-pharmacologic comfort measures Outcome: Progressing   Problem: Health Behavior/Discharge Planning: Goal: Ability to manage health-related needs will improve Outcome: Progressing   Problem: Clinical Measurements: Goal: Ability to maintain clinical measurements within normal limits will improve Outcome: Progressing Goal: Will remain free from infection Outcome: Progressing Goal: Diagnostic test results will improve Outcome: Progressing Goal: Respiratory complications will improve Outcome: Progressing Goal: Cardiovascular complication will  be avoided Outcome: Progressing   Problem: Activity: Goal: Risk for activity intolerance will decrease Outcome: Progressing   Problem: Nutrition: Goal: Adequate nutrition will be maintained Outcome: Progressing   Problem: Elimination: Goal: Will not experience complications related to bowel motility Outcome: Progressing Goal: Will not experience complications related to urinary retention Outcome: Progressing   Problem: Pain Managment: Goal: General experience of comfort will improve and/or be controlled Outcome: Progressing   Problem: Safety: Goal: Ability to remain free from injury will improve Outcome: Progressing   Problem: Skin Integrity: Goal: Risk for impaired skin integrity will decrease Outcome: Progressing

## 2024-10-25 ENCOUNTER — Encounter (HOSPITAL_COMMUNITY): Payer: Self-pay | Admitting: Gastroenterology

## 2024-10-25 ENCOUNTER — Encounter (HOSPITAL_COMMUNITY): Admission: EM | Disposition: A | Payer: Self-pay | Source: Home / Self Care | Attending: Internal Medicine

## 2024-10-25 DIAGNOSIS — K31819 Angiodysplasia of stomach and duodenum without bleeding: Secondary | ICD-10-CM

## 2024-10-25 DIAGNOSIS — D509 Iron deficiency anemia, unspecified: Secondary | ICD-10-CM

## 2024-10-25 DIAGNOSIS — K5900 Constipation, unspecified: Secondary | ICD-10-CM | POA: Diagnosis not present

## 2024-10-25 DIAGNOSIS — Z7902 Long term (current) use of antithrombotics/antiplatelets: Secondary | ICD-10-CM | POA: Diagnosis not present

## 2024-10-25 DIAGNOSIS — Z7901 Long term (current) use of anticoagulants: Secondary | ICD-10-CM

## 2024-10-25 DIAGNOSIS — K3189 Other diseases of stomach and duodenum: Secondary | ICD-10-CM | POA: Diagnosis not present

## 2024-10-25 DIAGNOSIS — D500 Iron deficiency anemia secondary to blood loss (chronic): Secondary | ICD-10-CM | POA: Diagnosis not present

## 2024-10-25 HISTORY — PX: GIVENS CAPSULE STUDY: SHX5432

## 2024-10-25 LAB — GLUCOSE, CAPILLARY
Glucose-Capillary: 73 mg/dL (ref 70–99)
Glucose-Capillary: 91 mg/dL (ref 70–99)
Glucose-Capillary: 162 mg/dL — ABNORMAL HIGH (ref 70–99)
Glucose-Capillary: 245 mg/dL — ABNORMAL HIGH (ref 70–99)
Glucose-Capillary: 304 mg/dL — ABNORMAL HIGH (ref 70–99)

## 2024-10-25 LAB — TYPE AND SCREEN
ABO/RH(D): A POS
Antibody Screen: NEGATIVE
Unit division: 0
Unit division: 0

## 2024-10-25 LAB — BPAM RBC
Blood Product Expiration Date: 202602022359
Blood Product Expiration Date: 202602032359
ISSUE DATE / TIME: 202601161444
ISSUE DATE / TIME: 202601172333
Unit Type and Rh: 6200
Unit Type and Rh: 6200

## 2024-10-25 LAB — BASIC METABOLIC PANEL WITH GFR
Anion gap: 10 (ref 5–15)
BUN: 31 mg/dL — ABNORMAL HIGH (ref 8–23)
CO2: 24 mmol/L (ref 22–32)
Calcium: 8.6 mg/dL — ABNORMAL LOW (ref 8.9–10.3)
Chloride: 105 mmol/L (ref 98–111)
Creatinine, Ser: 0.73 mg/dL (ref 0.61–1.24)
GFR, Estimated: >60 mL/min
Glucose, Bld: 73 mg/dL (ref 70–99)
Potassium: 3.9 mmol/L (ref 3.5–5.1)
Sodium: 140 mmol/L (ref 135–145)

## 2024-10-25 LAB — HEMOGLOBIN AND HEMATOCRIT, BLOOD
HCT: 25.3 % — ABNORMAL LOW (ref 39.0–52.0)
Hemoglobin: 8.3 g/dL — ABNORMAL LOW (ref 13.0–17.0)

## 2024-10-25 MED ORDER — SENNA 8.6 MG PO TABS
1.0000 | ORAL_TABLET | Freq: Once | ORAL | Status: AC
  Administered 2024-10-25: 8.6 mg via ORAL
  Filled 2024-10-25: qty 1

## 2024-10-25 MED ORDER — DEXTROSE 50 % IV SOLN
12.5000 g | INTRAVENOUS | Status: AC
  Administered 2024-10-25: 12.5 g via INTRAVENOUS
  Filled 2024-10-25: qty 50

## 2024-10-25 MED ORDER — GUAIFENESIN-DM 100-10 MG/5ML PO SYRP
5.0000 mL | ORAL_SOLUTION | ORAL | Status: AC
  Administered 2024-10-25 – 2024-10-26 (×6): 5 mL via ORAL
  Filled 2024-10-25 (×6): qty 5

## 2024-10-25 MED ORDER — POLYETHYLENE GLYCOL 3350 17 GM/SCOOP PO POWD: 119.0000 g | Freq: Once | ORAL | Status: DC

## 2024-10-25 NOTE — Progress Notes (Signed)
" ° ° °  PROCEDURAL EXPEDITER PROGRESS NOTE  Patient Name: Richard [name]  DOB:12-21-1960 Date of Admission: 10/22/2024  Date of Assessment:10/25/24   -------------------------------------------------------------------------------------------------------------------   Brief clinical summary: Pt to Endo today for capsule endoscopy  Orders in place:  Yes   Labs, test, and orders reviewed: Y  Requires surgical clearance:  No  Barriers noted: N/A   -------------------------------------------------------------------------------------------------------------------  [facility], [place], [place] Please contact us  directly via secure chat (search for [facility]) or by calling us  at [phone] [facility]).  "

## 2024-10-25 NOTE — Progress Notes (Signed)
 Mobility Specialist Progress Note:    10/25/24 1305  Mobility  Activity Ambulated with assistance  Level of Assistance Standby assist, set-up cues, supervision of patient - no hands on  Assistive Device Front wheel walker  Distance Ambulated (ft) 50 ft  Range of Motion/Exercises Active  Activity Response Tolerated fair  Mobility Referral Yes  Mobility visit 1 Mobility  Mobility Specialist Start Time (ACUTE ONLY) 1305  Mobility Specialist Stop Time (ACUTE ONLY) 1318  Mobility Specialist Time Calculation (min) (ACUTE ONLY) 13 min   Received pt sitting EOB requesting and agreeable to hallway ambulation. Pt c/o SOB after short hallway ambulation. Pt requiring one standing break before return trip. Pt otherwise moving and ambulating well. Returned pt to room w/ all needs met.   Venetia Keel Mobility Specialist Please Neurosurgeon or Rehab Office at [phone]

## 2024-10-25 NOTE — Progress Notes (Signed)
 "  HD#3 SUBJECTIVE:  Patient Summary: Richard [name] is a 64 y.o. with a pertinent PMH of COPD, asthma, CAD, moderate AS/MS, HTN, and T2DM w/peripheral neuropathy who presented to the [facility] ED with dyspnea and cough and subsequently admitted for asthma exacerbation.    Overnight Events: No acute event overnight.  Had a restful sleep.  Interim History:  Doing well, he is awake he has Endoscopy with GI Today. Still intermittently dizzy when he ambulates to the restroom. No bowel movement yet, after half MiraLAX  prep.  Will get additional dose today.  Per GI, given patient's history of mild aortic stenosis as noted in echo 2025, asking for repeat echo to assess progression in light of development of AVMs.  OBJECTIVE:  Vital Signs: Vitals:   10/25/24 0200 10/25/24 0316 10/25/24 0426 10/25/24 0737  BP: 118/69 114/62  111/69  Pulse: 77 78  83  Resp: 12 20  20   Temp: 97.7 F (36.5 C) 98.3 F (36.8 C)  98.3 F (36.8 C)  TempSrc: Oral Oral  Oral  SpO2: 100% 99% 100% 100%  Weight:      Height:       Supplemental O2: Room Air SpO2: 100 % O2 Flow Rate (L/min): 2 L/min  Filed Weights   10/22/24 0921  Weight: 103 kg    Intake/Output Summary (Last 24 hours) at 10/25/2024 1112 Last data filed at 10/25/2024 0300 Gross per 24 hour  Intake 608.34 ml  Output --  Net 608.34 ml   Net IO Since Admission: -1,634.66 mL [10/25/24 1112]  Physical Exam: Pleasant, well-appearing. Expiratory wheezing on lung exam, able to speak in full sentences with intermittent bouts of dry/mildly productive cough. RRR, crescendo-decrescendo systolic murmur RUSB. Abdomen soft, mildly distended, normal bowel sounds.  Patient Lines/Drains/Airways Status     Active Line/Drains/Airways     Name Placement date Placement time Site Days   Peripheral IV 10/22/24 20 G Anterior;Distal;Left;Upper Arm 10/22/24  1409  Arm  3   Peripheral IV 10/23/24 20 G 1.88 Anterior;Proximal;Right Forearm 10/23/24  0907   Forearm  2   Wound 08/24/24 1403 Surgical Closed Surgical Incision Knee Right 08/24/24  1403  Knee  62           Pertinent labs and imaging:     Latest Ref Rng & Units 10/25/2024    6:00 AM 10/24/2024   10:26 PM 10/24/2024    3:20 AM  CBC  WBC 4.0 - 10.5 K/uL   10.0   Hemoglobin 13.0 - 17.0 g/dL 8.3  7.2  7.4   Hematocrit 39.0 - 52.0 % 25.3  22.6  22.8   Platelets 150 - 400 K/uL   229        Latest Ref Rng & Units 10/25/2024    6:00 AM 10/24/2024    3:20 AM 10/23/2024   11:10 AM  CMP  Glucose 70 - 99 mg/dL 73  874  707   BUN 8 - 23 mg/dL 31  65  75   Creatinine 0.61 - 1.24 mg/dL 9.26  8.88  8.82   Sodium 135 - 145 mmol/L 140  134  125   Potassium 3.5 - 5.1 mmol/L 3.9  4.3  4.8   Chloride 98 - 111 mmol/L 105  103  96   CO2 22 - 32 mmol/L 24  21  17    Calcium  8.9 - 10.3 mg/dL 8.6  8.6  8.6   Total Protein 6.5 - 8.1 g/dL  5.8    Total Bilirubin  0.0 - 1.2 mg/dL  <9.7    Alkaline Phos 38 - 126 U/L  84    AST 15 - 41 U/L  11    ALT 0 - 44 U/L  12      No results found.    ASSESSMENT/PLAN:  Assessment: Principal Problem:   Acute exacerbation of COPD with asthma (HCC) Active Problems:   Asthma-COPD overlap syndrome (HCC)   Diabetic neuropathy, painful (HCC)   Acute on chronic anemia   Anterior chest wall pain   S/P TKR (total knee replacement), right   DM (diabetes mellitus), type 2 (HCC)   Leukopenia   Azotemia   Electrolyte imbalance (hyperkalemia, hyponatremia)  Richard [name] is a 64 y.o. person with a PMH of COPD, asthma, CAD, moderate AS/MS, HTN, and T2DM w/peripheral neuropathy who presented to the [facility] ED with dyspnea and cough and subsequently admitted for asthma/COPD exacerbation, he was  also found to have acute on chronic anemia.  Plan: #Asthma-COPD overlap syndrome #Acute exacerbation of COPD with asthma #Asthma exacerbation Improving; HDS on RA. Treating asthma/COPD exacerbation with azithromycin , steroid, scheduled DuoNeb, and home inhalers.  Continues to have intermittent cough with poor sputum expectoration despite Tussionex. - Continue home Duoneb Q4hrs, Pulmicort  and Brovana  - Continue azithromycin  500 mg 3/3, prednisone  40 mg 3/5. - Schedule Mucinex  DM dose to every 4 hours.  Acute on chronic normocytic anemia Concern for UGI Received 1 unit of blood yesterday, posttransfusion H&H 8.2.  Downtrending BUN may suggest decreasing or resolving bleeding. GI following, high suspicion for intermittent bleeding from small bowel AVMs as noted on his most recent EGD.  Holding Lovenox  and clopidogrel  in setting of acute anemia. - GI consulted, appreciate recs. - Follow-up results of capsule endoscopy, resume diet afterwards. - Continue IV Protonix  40 mg BID. - Monitor CBC, transfuse if Hb <7 - Ordered echocardiogram.  # Chest wall Pain # Cough Currently on Tussionex 5 mL BID and Mucinex  DM 5 mL Q4 h PRN. Will switch to scheduled Mucinex  DM as he continues to have difficulty with sputum expectoration.  T2DM Diabetic neuropathy Blood glucose improved; fasting 120, postprandial 260-290.  He is on Semglee  25 units daily. -Will resume normal insulin  dose after procedure. -Continue metformin  1000 mg and empagliflozin  25 mg -Continue SSI; resistant scale. -Lyrica  100 mg 3 times daily for diabetic neuropathy  # Constipation - Work on constipation today.   Osteoarthritis of right knee S/p right total knee arthroplasty 6 weeks ago, knee pain much better controlled now. - Continue to monitor  HTN CAD Moderate AS/MS Patient has a history of CAD and moderate AS/MS. low suspicion for ACS, given normal troponin, EKG without ischemic changes.  Ongoing chest pain is secondary to recurrent bouts of coughing. - continue home oral atorvastatin  40 mg daily - continue home oral Plavix  75 mg daily  Best Practice: Diet: N.p.o, resume normal diet after procedure. IVF: Fluids: None VTE: Place and maintain sequential compression device Start:  10/23/24 1731 Code: Full  Disposition planning: Therapy Recs: Outpatient PT Family Contact: Fianc, at bedside. DISPO: Anticipated discharge in 1-3 days to Home pending further workup.  Signature: Aicha Clingenpeel, M.D.  Internal Medicine Resident, PGY-2  11:12 AM, 10/25/2024  "

## 2024-10-25 NOTE — Progress Notes (Addendum)
 Patient ID: Richard [name], male   DOB: 26-Oct-1960, 64 y.o.   MRN: [id number]    Progress Note   Subjective   Day # 4 CC; anemia, iron  deficiency, drop in hemoglobin this admission in setting of Lovenox  and Plavix  without evidence of active bleeding.  Recent diagnosis of AVMs  Lovenox  stopped Plavix  on hold  Labs today-hemoglobin 8.3/hematocrit 25.2-up from hemoglobin of 7.2/hematocrit 22.6 posttransfusion yesterday  Patient swallowed the capsule, diet being advanced.  He has been able to ambulate some but is having some wheezing this morning.-He took the MiraLAX  Gatorade prep last evening but still has not had a bowel movement, willing to take another prep if needed   Objective   Vital signs in last 24 hours: Temp:  [97.7 F (36.5 C)-98.5 F (36.9 C)] 98.3 F (36.8 C) (01/18 0737) Pulse Rate:  [77-97] 83 (01/18 0737) Resp:  [12-20] 20 (01/18 0737) BP: (105-123)/(53-71) 111/69 (01/18 0737) SpO2:  [99 %-100 %] 100 % (01/18 0737) Last BM Date : 10/22/24 General: Older African-American male in NAD, sitting on the side of the bed Heart:  Regular rate and rhythm; systolic murmur Lungs: Respirations even and unlabored, scattered wheezes Abdomen:  Soft, nontender ,. Normal bowel sounds. Extremities:  Without edema. Neurologic:  Alert and oriented,  grossly normal neurologically. Psych:  Cooperative. Normal mood and affect.  Intake/Output from previous day: 01/17 0701 - 01/18 0700 In: 608.3 [P.O.:100; I.V.:31.7; Blood:476.7] Out: -  Intake/Output this shift: No intake/output data recorded.  Lab Results: Recent Labs    10/23/24 0229 10/23/24 1110 10/24/24 0320 10/24/24 2226 10/25/24 0600  WBC 10.4  --  10.0  --   --   HGB 8.2*   < > 7.4* 7.2* 8.3*  HCT 26.8*   < > 22.8* 22.6* 25.3*  PLT 312  --  229  --   --    < > = values in this interval not displayed.   BMET Recent Labs    10/23/24 1110 10/24/24 0320 10/25/24 0600  NA 125* 134* 140  K 4.8 4.3 3.9  CL 96* 103  105  CO2 17* 21* 24  GLUCOSE 292* 125* 73  BUN 75* 65* 31*  CREATININE 1.17 1.11 0.73  CALCIUM  8.6* 8.6* 8.6*   LFT Recent Labs    10/24/24 0320  PROT 5.8*  ALBUMIN 3.4*  AST 11*  ALT 12  ALKPHOS 84  BILITOT <0.2   PT/INR No results for input(s): LABPROT, INR in the last 72 hours.  Studies/Results: CT ABDOMEN PELVIS W CONTRAST Result Date: 10/23/2024 EXAM: CT ABDOMEN AND PELVIS WITH CONTRAST 10/23/2024 06:40:52 PM TECHNIQUE: CT of the abdomen and pelvis was performed with the administration of 75 mL of iohexol  (OMNIPAQUE ) 350 MG/ML injection. Multiplanar reformatted images are provided for review. Automated exposure control, iterative reconstruction, and/or weight-based adjustment of the mA/kV was utilized to reduce the radiation dose to as low as reasonably achievable. COMPARISON: 08/22/2022 CLINICAL HISTORY: Anemia (Ped 0-17y); Anemia with 2 g drop in hemoglobin, over past 24 hours, on Lovenox  rule out retroperitoneal hematoma or other intra-abdominal bleed. FINDINGS: LOWER CHEST: Trace pericardial effusion. LIVER: The liver is unremarkable. GALLBLADDER AND BILE DUCTS: Decompressed gallbladder. No radiopaque gallstones. No biliary ductal dilatation. SPLEEN: No acute abnormality. PANCREAS: No acute abnormality. ADRENAL GLANDS: No acute abnormality. KIDNEYS, URETERS AND BLADDER: Small left upper pole renal cyst. No stones in the kidneys or ureters. No hydronephrosis. No perinephric or periureteral stranding. The urinary bladder is partially distended, without focal abnormality. Per consensus, no  follow-up is needed for simple Bosniak type 1 and 2 renal cysts, unless the patient has a malignancy history or risk factors. GI AND BOWEL: Heterogeneous material in the stomach, likely ingested material. Moderate amount of hyperdensity, well formed stool throughout the colon. The appendix was not visualized. No pericecal or right lower quadrant inflammatory stranding to suggest acute appendicitis.  There is no bowel obstruction. PERITONEUM AND RETROPERITONEUM: No ascites. No free air. No free pelvic fluid. VASCULATURE: Aorta is normal in caliber. Diffuse aortoiliac atherosclerosis. LYMPH NODES: No lymphadenopathy. REPRODUCTIVE ORGANS: No prostatomegaly. No acute abnormality. BONES AND SOFT TISSUES: Multilevel degenerative disc disease of the spine. Severe osteoarthritis of the left hip. Mild to moderate right hip osteoarthritis. No acute osseous abnormality. No focal soft tissue abnormality. IMPRESSION: 1. No acute intraabdominal or pelvic abnormality. No retroperitoneal hematoma. 2. Moderate amount of hyperdense, well-formed stool throughout the colon , which may reflect constipation and occult blood products. Fecal hemoccult testing may be of benefit as clinically warranted. 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  Asked to evaluate here for drop in hemoglobin since this hospitalization without any evidence of active GI bleeding.  He has required 2 units of packed RBCs.  Hemoccult here had been negative, patient had been quite constipated.  CT abdomen and pelvis to rule out any retroperitoneal blood loss in setting of patient being on Lovenox  and Plavix .  That was negative  Undergoing capsule endoscopy today to evaluate for additional small bowel AVMs  #2 constipation-will give another half MiraLAX  Gatorade prep this evening #3 insulin -dependent diabetes mellitus  #4 admitted with COPD/asthmatic bronchitis exacerbation #5 coronary artery disease #6.  Mild aortic stenosis documented on previous echo February 2025, will  ask for repeat echo to assess for progression-especially in light of the development of AVMs  Plan; continue to trend hemoglobin, transfuse as indicated Off Plavix  for now, primary team to investigate indication for continued Plavix   Capsule endoscopy will be interpreted tomorrow, then further GI recommendations pending findings  Will repeat half MiraLAX /Gatorade prep later this afternoon    Principal Problem:   Acute exacerbation of COPD with asthma (HCC) Active Problems:   Asthma-COPD overlap syndrome (HCC)   Diabetic neuropathy, painful (HCC)   Acute on chronic anemia   Anterior chest wall pain   S/P TKR (total knee replacement), right   DM (diabetes mellitus), type 2 (HCC)   Leukopenia   Azotemia   Electrolyte imbalance (hyperkalemia, hyponatremia)     LOS: 3 days   Amy Esterwood PA-C 10/25/2024, 11:34 AM     Attending physician's note   I have taken history, reviewed the chart and examined the patient. I performed a substantive portion of this encounter, including complete performance of at least one of the key components, in conjunction with the APP. I agree with the Advanced Practitioner's note, impression and recommendations.   Recurrent IDA with heme neg stools. Neg CTAP for R/P bleed. Neg colon 09/2024. 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 Off Plavix /Lovenox . Hb from 9.4 to 6.9 s/p 1U to 8.3 (today)  Aortic stenosis- 2DE  pending for EF/severity  Adm with asthma/COPD exacerbation.Better  Recent right knee replacement 08/24/2024  Plan: -VCE in progress.  Would be available to read tomorrow. -Depending upon above results, rpt EGD vs SBE vs observation -Med svc to determine need for long-term Plavix . OK with bASA. -Continue Protonix  40 BID.  Can switch to PO  -MiraLAX  for constipation -If significant aortic stenosis, please consult cardiology -Avoid OTC NSAIDs -Would need hematology consultation as outpt -Dr. Suzann  taking over GI service tomorrow.   Anselm Bring, MD [facility] [phone]

## 2024-10-26 ENCOUNTER — Encounter (HOSPITAL_COMMUNITY): Payer: Self-pay | Admitting: Internal Medicine

## 2024-10-26 ENCOUNTER — Inpatient Hospital Stay (HOSPITAL_COMMUNITY)

## 2024-10-26 DIAGNOSIS — J441 Chronic obstructive pulmonary disease with (acute) exacerbation: Secondary | ICD-10-CM | POA: Diagnosis not present

## 2024-10-26 DIAGNOSIS — R7989 Other specified abnormal findings of blood chemistry: Secondary | ICD-10-CM | POA: Diagnosis not present

## 2024-10-26 DIAGNOSIS — I350 Nonrheumatic aortic (valve) stenosis: Secondary | ICD-10-CM

## 2024-10-26 DIAGNOSIS — R0789 Other chest pain: Secondary | ICD-10-CM | POA: Diagnosis not present

## 2024-10-26 DIAGNOSIS — D62 Acute posthemorrhagic anemia: Secondary | ICD-10-CM

## 2024-10-26 DIAGNOSIS — E875 Hyperkalemia: Secondary | ICD-10-CM | POA: Diagnosis not present

## 2024-10-26 DIAGNOSIS — K31819 Angiodysplasia of stomach and duodenum without bleeding: Secondary | ICD-10-CM

## 2024-10-26 LAB — GLUCOSE, CAPILLARY
Glucose-Capillary: 347 mg/dL — ABNORMAL HIGH (ref 70–99)
Glucose-Capillary: 137 mg/dL — ABNORMAL HIGH (ref 70–99)
Glucose-Capillary: 158 mg/dL — ABNORMAL HIGH (ref 70–99)
Glucose-Capillary: 212 mg/dL — ABNORMAL HIGH (ref 70–99)
Glucose-Capillary: 314 mg/dL — ABNORMAL HIGH (ref 70–99)
Glucose-Capillary: 233 mg/dL — ABNORMAL HIGH (ref 70–99)

## 2024-10-26 LAB — CBC
HCT: 28.2 % — ABNORMAL LOW (ref 39.0–52.0)
Hemoglobin: 9 g/dL — ABNORMAL LOW (ref 13.0–17.0)
MCH: 27.3 pg (ref 26.0–34.0)
MCHC: 31.9 g/dL (ref 30.0–36.0)
MCV: 85.5 fL (ref 80.0–100.0)
Platelets: 252 K/uL (ref 150–400)
RBC: 3.3 MIL/uL — ABNORMAL LOW (ref 4.22–5.81)
RDW: 15.7 % — ABNORMAL HIGH (ref 11.5–15.5)
WBC: 8.8 K/uL (ref 4.0–10.5)
nRBC: 0.2 % (ref 0.0–0.2)

## 2024-10-26 LAB — BASIC METABOLIC PANEL WITH GFR
Anion gap: 11 (ref 5–15)
BUN: 20 mg/dL (ref 8–23)
CO2: 25 mmol/L (ref 22–32)
Calcium: 8.8 mg/dL — ABNORMAL LOW (ref 8.9–10.3)
Chloride: 102 mmol/L (ref 98–111)
Creatinine, Ser: 0.88 mg/dL (ref 0.61–1.24)
GFR, Estimated: >60 mL/min
Glucose, Bld: 153 mg/dL — ABNORMAL HIGH (ref 70–99)
Potassium: 4 mmol/L (ref 3.5–5.1)
Sodium: 137 mmol/L (ref 135–145)

## 2024-10-26 LAB — ECHOCARDIOGRAM COMPLETE BUBBLE STUDY
AR max vel: 1.8 cm2
AV Area VTI: 1.95 cm2
AV Area mean vel: 1.74 cm2
AV Mean grad: 21 mmHg
AV Peak grad: 36.7 mmHg
Ao pk vel: 3.03 m/s
Area-P 1/2: 3.03 cm2
MV VTI: 2.3 cm2
S' Lateral: 3.35 cm

## 2024-10-26 LAB — HEMOGLOBIN AND HEMATOCRIT, BLOOD
HCT: 25.6 % — ABNORMAL LOW (ref 39.0–52.0)
Hemoglobin: 8.3 g/dL — ABNORMAL LOW (ref 13.0–17.0)

## 2024-10-26 LAB — HAPTOGLOBIN: Haptoglobin: 166 mg/dL (ref 32–363)

## 2024-10-26 MED ORDER — LINACLOTIDE 145 MCG PO CAPS
290.0000 ug | ORAL_CAPSULE | Freq: Every day | ORAL | Status: DC
  Administered 2024-10-26 – 2024-10-28 (×3): 290 ug via ORAL
  Filled 2024-10-26 (×3): qty 2

## 2024-10-26 MED ORDER — SODIUM CHLORIDE 0.9 % IV SOLN: INTRAVENOUS | Status: DC

## 2024-10-26 MED ORDER — INSULIN GLARGINE 100 UNIT/ML ~~LOC~~ SOLN
26.0000 [IU] | Freq: Every day | SUBCUTANEOUS | Status: DC
  Administered 2024-10-26 – 2024-10-27 (×2): 26 [IU] via SUBCUTANEOUS
  Filled 2024-10-26 (×3): qty 0.26

## 2024-10-26 MED ORDER — PANTOPRAZOLE SODIUM 40 MG PO TBEC
40.0000 mg | DELAYED_RELEASE_TABLET | Freq: Two times a day (BID) | ORAL | Status: DC
  Administered 2024-10-26 – 2024-10-28 (×5): 40 mg via ORAL
  Filled 2024-10-26 (×5): qty 1

## 2024-10-26 NOTE — Progress Notes (Signed)
 Patient is non compliant with CPAP and will DC order. Nurse and MD is aware.

## 2024-10-26 NOTE — Progress Notes (Addendum)
 "   Inpatient Progress Note     Patient Profile/Chief Complaint  64 year old male with history of aortic stenosis, CAD on Plavix , recent knee replacement on aspirin , IDA, gastric and duodenal angioectasias admitted with asthma exacerbation.  Patient demonstrated anemia and decline in hemoglobin this admission in the setting of Lovenox  and Plavix  without active bleeding.  EGD 09/2024 with gastric and duodenal angioectasias status post clip placement on duodenal angioectasias  Lovenox  and Plavix  on hold  Hemoccult negative this hospitalization  VCE completed 10/25/2024 -1 nonbleeding angioectasia in the duodenal bulb, 1 mucosal break versus diminutive angioectasia in the proximal duodenum.  Otherwise normal video capsule endoscopy complete to the cecum   Interval History   -- Clinically stable overnight without any acute events -- Feels that his breathing is improving -no wheezes on exam this morning after recent breathing treatment -- Reports ongoing constipation -states last good bowel movement was around the time of colonoscopy    Objective   Vital signs in last 24 hours: Temp:  [97.6 F (36.4 C)-98.5 F (36.9 C)] 98.4 F (36.9 C) (01/19 0808) Pulse Rate:  [79-94] 87 (01/19 0808) Resp:  [13-20] 20 (01/19 0808) BP: (99-130)/(64-82) 99/81 (01/19 0808) SpO2:  [99 %-100 %] 100 % (01/19 0808) Last BM Date : 10/22/24 General:    Alert, resting in bed in no distress, speaking in full sentences Heart:  Regular rate and rhythm; no murmurs Lungs: respirations even and unlabored, lungs CTA bilaterally,  Abdomen:  Soft, nontender and nondistended. Normal bowel sounds. Extremities:  Without edema. Neurologic:  Alert and oriented,  grossly normal neurologically. Psych:  Cooperative. Normal mood and affect.  Intake/Output from previous day: 01/18 0701 - 01/19 0700 In: 720 [P.O.:720] Out: -  Intake/Output this shift: No intake/output data recorded.  Lab Results: Recent Labs     10/24/24 0320 10/24/24 2226 10/25/24 0600 10/26/24 0315 10/26/24 0814  WBC 10.0  --   --   --  8.8  HGB 7.4*   < > 8.3* 8.3* 9.0*  HCT 22.8*   < > 25.3* 25.6* 28.2*  PLT 229  --   --   --  252   < > = values in this interval not displayed.   BMET Recent Labs    10/23/24 1110 10/24/24 0320 10/25/24 0600  NA 125* 134* 140  K 4.8 4.3 3.9  CL 96* 103 105  CO2 17* 21* 24  GLUCOSE 292* 125* 73  BUN 75* 65* 31*  CREATININE 1.17 1.11 0.73  CALCIUM  8.6* 8.6* 8.6*   LFT Recent Labs    10/24/24 0320  PROT 5.8*  ALBUMIN 3.4*  AST 11*  ALT 12  ALKPHOS 84  BILITOT <0.2   PT/INR No results for input(s): LABPROT, INR in the last 72 hours.  Studies/Results: ECHOCARDIOGRAM COMPLETE BUBBLE STUDY Result Date: 10/26/2024    ECHOCARDIOGRAM REPORT   Patient Name:   Richard [name] Date of Exam: 10/26/2024 Medical Rec #:  [id number]       Height:       75.0 in Accession #:    [id number]      Weight:       227.1 lb Date of Birth:  1960/12/07        BSA:          2.316 m Patient Age:    63 years        BP:           99/81 mmHg Patient Gender: M  HR:           85 bpm. Exam Location:  Inpatient Procedure: 2D Echo, Cardiac Doppler, Color Doppler and Saline Contrast Bubble            Study (Both Spectral and Color Flow Doppler were utilized during            procedure). Indications:    Aortic stenosis  History:        Patient has prior history of Echocardiogram examinations, most                 recent 11/29/2023. CAD, COPD; Risk Factors:Hypertension and                 Diabetes.  Sonographer:    Meagan Baucom RDCS, FE, PE Referring Phys: [id number] JULIE ANNE WILLIAMS IMPRESSIONS  1. Left ventricular ejection fraction, by estimation, is 60 to 65%. The left ventricle has normal function. The left ventricle has no regional wall motion abnormalities. There is moderate asymmetric left ventricular hypertrophy of the basal and septal segments. Left ventricular diastolic parameters are indeterminate.   2. Right ventricular systolic function is normal. The right ventricular size is normal.  3. Left atrial size was mildly dilated.  4. The mitral valve is degenerative. Trivial mitral valve regurgitation. Mild mitral stenosis. Severe mitral annular calcification.  5. Compared to prior echo 11/29/23 AS a bit worse mean graident 12-> 21 mmHg peak 22.1 -> 36.7 mmHg AVA 2.49-> 1.8 cm2 and DVI 0.38-> 0.37 . The aortic valve is tricuspid. There is moderate calcification of the aortic valve. There is moderate thickening of the aortic valve. Aortic valve regurgitation is not visualized. Moderate aortic valve stenosis.  6. The inferior vena cava is normal in size with greater than 50% respiratory variability, suggesting right atrial pressure of 3 mmHg.  7. Agitated saline contrast bubble study was negative, with no evidence of any interatrial shunt. FINDINGS  Left Ventricle: Left ventricular ejection fraction, by estimation, is 60 to 65%. The left ventricle has normal function. The left ventricle has no regional wall motion abnormalities. Strain was performed and the global longitudinal strain is indeterminate. The left ventricular internal cavity size was normal in size. There is moderate asymmetric left ventricular hypertrophy of the basal and septal segments. Left ventricular diastolic parameters are indeterminate. Right Ventricle: The right ventricular size is normal. No increase in right ventricular wall thickness. Right ventricular systolic function is normal. Left Atrium: Left atrial size was mildly dilated. Right Atrium: Right atrial size was normal in size. Pericardium: There is no evidence of pericardial effusion. Mitral Valve: The mitral valve is degenerative in appearance. There is moderate thickening of the mitral valve leaflet(s). There is moderate calcification of the mitral valve leaflet(s). Severe mitral annular calcification. Trivial mitral valve regurgitation. Mild mitral valve stenosis. MV peak gradient,  10.4 mmHg. The mean mitral valve gradient is 6.0 mmHg. Tricuspid Valve: The tricuspid valve is normal in structure. Tricuspid valve regurgitation is mild . No evidence of tricuspid stenosis. Aortic Valve: Compared to prior echo 11/29/23 AS a bit worse mean graident 12-> 21 mmHg peak 22.1 -> 36.7 mmHg AVA 2.49-> 1.8 cm2 and DVI 0.38-> 0.37. The aortic valve is tricuspid. There is moderate calcification of the aortic valve. There is moderate thickening of the aortic valve. Aortic valve regurgitation is not visualized. Moderate aortic stenosis is present. Aortic valve mean gradient measures 21.0 mmHg. Aortic valve peak gradient measures 36.7 mmHg. Aortic valve area, by VTI measures 1.95 cm. Pulmonic Valve:  The pulmonic valve was normal in structure. Pulmonic valve regurgitation is trivial. No evidence of pulmonic stenosis. Aorta: The aortic root is normal in size and structure. Venous: The inferior vena cava is normal in size with greater than 50% respiratory variability, suggesting right atrial pressure of 3 mmHg. IAS/Shunts: No atrial level shunt detected by color flow Doppler. Agitated saline contrast was given intravenously to evaluate for intracardiac shunting. Agitated saline contrast bubble study was negative, with no evidence of any interatrial shunt. Additional Comments: 3D was performed not requiring image post processing on an independent workstation and was indeterminate.  LEFT VENTRICLE PLAX 2D LVIDd:         4.95 cm   Diastology LVIDs:         3.35 cm   LV e' medial:    5.87 cm/s LV PW:         1.20 cm   LV E/e' medial:  23.7 LV IVS:        1.25 cm   LV e' lateral:   5.55 cm/s LVOT diam:     2.60 cm   LV E/e' lateral: 25.0 LV SV:         111 LV SV Index:   48 LVOT Area:     5.31 cm  RIGHT VENTRICLE RV S prime:     12.50 cm/s TAPSE (M-mode): 2.0 cm LEFT ATRIUM              Index        RIGHT ATRIUM           Index LA diam:        3.70 cm  1.60 cm/m   RA Area:     17.40 cm LA Vol (A2C):   105.0 ml 45.34  ml/m  RA Volume:   45.10 ml  19.47 ml/m LA Vol (A4C):   71.6 ml  30.91 ml/m LA Biplane Vol: 86.4 ml  37.31 ml/m  AORTIC VALVE AV Area (Vmax):    1.80 cm AV Area (Vmean):   1.74 cm AV Area (VTI):     1.95 cm AV Vmax:           303.00 cm/s AV Vmean:          213.333 cm/s AV VTI:            0.570 m AV Peak Grad:      36.7 mmHg AV Mean Grad:      21.0 mmHg LVOT Vmax:         103.00 cm/s LVOT Vmean:        70.100 cm/s LVOT VTI:          0.209 m LVOT/AV VTI ratio: 0.37  AORTA Ao Root diam: 3.50 cm Ao Asc diam:  3.50 cm MITRAL VALVE                TRICUSPID VALVE MV Area (PHT): 3.03 cm     TR Peak grad:   12.2 mmHg MV Area VTI:   2.30 cm     TR Vmax:        175.00 cm/s MV Peak grad:  10.4 mmHg MV Mean grad:  6.0 mmHg     SHUNTS MV Vmax:       1.62 m/s     Systemic VTI:  0.21 m MV Vmean:      116.5 cm/s   Systemic Diam: 2.60 cm MV Decel Time: 250 msec MV E velocity: 139.00 cm/s MV A velocity: 160.00 cm/s MV E/A  ratio:  0.87 Maude Emmer MD Electronically signed by Maude Emmer MD Signature Date/Time: 10/26/2024/9:52:08 AM    Final     Endoscopic Studies: VCE completed 10/25/2024 -1 nonbleeding angioectasia in the duodenal bulb, 1 mucosal break versus diminutive angioectasia in the proximal duodenum.  Otherwise normal video capsule endoscopy complete to the cecum   Clinical Impression   64 year old male with history of aortic stenosis, CAD on Plavix , recent knee replacement on aspirin , IDA, gastric and duodenal angioectasias admitted with asthma exacerbation.  Patient demonstrated anemia and decline in hemoglobin this admission in the setting of Lovenox  and Plavix  without active bleeding.  EGD 09/2024 with gastric and duodenal angioectasias status post clip placement on duodenal angioectasias  Lovenox  and Plavix  on hold  Hemoccult negative this hospitalization  VCE completed 10/25/2024 -1 nonbleeding angioectasia in the duodenal bulb, 1 mucosal break versus diminutive angioectasia in the proximal  duodenum.  Otherwise normal video capsule endoscopy complete to the cecum  Suspect intermittent bleeding from gastric and proximal small bowel angioectasias as a source of bleeding and anemia.  Once respiratory status is optimized from an asthma perspective, may be reasonable to pursue small bowel enteroscopy this hospitalization to evaluate for additional angioectasias that may not have been seen by video capsule and to perform ablation treatment with APC.  Modalities to treat angioectasias are more readily available in the hospital-based setting and therefore may be preferable to complete while patient is in the hospital if his pulmonary status does not preclude this.  In terms of his constipation, he reports that this has been a longstanding issue.  He tries to treat this with dietary modification and over-the-counter laxatives at home.  Discussed a trial of Linzess  while he is in the hospital today.   Plan  Will discuss with primary team possibility of proceeding with SBE this hospitalization after patient's pulmonary status has been optimized.  Timing TBD pending pulmonary status. Continue to trend serial H&H Continue holding Plavix ; continue baby aspirin  Continue Protonix  40 mg p.o. twice daily Follow-up echo results Start Linzess  290 mcg orally daily for constipation management May continue current diet at present  Addendum: Per primary team, patient's pulmonary status has been optimized.  Will proceed with small bowel enteroscopy tomorrow 10/27/2024.  Orders have been placed for patient to be n.p.o. after midnight except sips of water  and medications.    LOS: 4 days   Inocente CHRISTELLA Hausen  10/26/2024, 10:31 AM  Inocente Hausen, MD [facility]  "

## 2024-10-26 NOTE — Progress Notes (Signed)
 Occupational Therapy Treatment Patient Details Name: Richard [name] MRN: [id number] DOB: 1961/02/15 Today's Date: 10/26/2024   History of present illness 64 y.o. male presents to [facility] 10/22/24 with acute exacerbation of COPD with asthma. PMH includes: COPD, asthma, HTN, HLD, CAD, DM II, L transmetatarsal amputation (09/18/23), R TKA (08/24/24).   OT comments  Patient received sitting on EOB. Patient stated he had already completed self care this am and was willing to address tub transfers. Patient able to step into shower with simulated tub with trash can with CGA and use of grab bars to step out of shower. Discharge recommendations continue to be appropriate. Acute OT to continue to follow to address established goals.       If plan is discharge home, recommend the following:  A little help with walking and/or transfers;A little help with bathing/dressing/bathroom;Assist for transportation;Help with stairs or ramp for entrance   Equipment Recommendations  Tub/shower bench    Recommendations for Other Services      Precautions / Restrictions Precautions Precautions: Fall Recall of Precautions/Restrictions: Intact Precaution/Restrictions Comments: watch BP Restrictions Weight Bearing Restrictions Per Provider Order: No       Mobility Bed Mobility Overal bed mobility: Modified Independent                  Transfers Overall transfer level: Needs assistance Equipment used: None Transfers: Sit to/from Stand Sit to Stand: Contact guard assist           General transfer comment: CGA for safety with toilet and tub transfers     Balance Overall balance assessment: Needs assistance, Mild deficits observed, not formally tested Sitting-balance support: No upper extremity supported, Feet supported Sitting balance-Leahy Scale: Good     Standing balance support: Single extremity supported, No upper extremity supported, During functional activity Standing balance-Leahy  Scale: Poor Standing balance comment: CGA for safety with mobility and transfers and in room mobility                           ADL either performed or assessed with clinical judgement   ADL Overall ADL's : Needs assistance/impaired     Grooming: Supervision/safety;Standing                   Toilet Transfer: Contact guard assist;Ambulation       Tub/ Engineer, Structural: Tub transfer;Contact guard assist;Grab bars Tub/Shower Transfer Details (indicate cue type and reason): stepped over simulated tub into shower and used grab bar to step out        [employer] Communication: No apparent difficulties   Cognition Arousal: Alert Behavior During Therapy: WFL for tasks assessed/performed Cognition: No apparent impairments                               Following commands: Intact        Cueing   Cueing Techniques: Verbal cues  Exercises      Shoulder Instructions       General Comments VSS on RA    Pertinent Vitals/ Pain       Pain Assessment Pain Assessment: Faces Faces Pain Scale: Hurts a little bit Pain Location: chest Pain Descriptors / Indicators: Discomfort, Tightness Pain Intervention(s): Limited activity within  patient's tolerance, Monitored during session, Repositioned  Home Living                                          Prior Functioning/Environment              Frequency  Min 2X/week        Progress Toward Goals  OT Goals(current goals can now be found in the care plan section)  Progress towards OT goals: Progressing toward goals  Acute Rehab OT Goals OT Goal Formulation: With patient Time For Goal Achievement: 11/06/24 Potential to Achieve Goals: Good ADL Goals Pt Will Perform Grooming: with modified independence;standing Pt Will Perform Lower Body Bathing: with modified  independence;sit to/from stand Pt Will Perform Lower Body Dressing: with modified independence;sit to/from stand Pt Will Transfer to Toilet: with modified independence;ambulating Pt Will Perform Toileting - Clothing Manipulation and hygiene: with modified independence;sit to/from stand Pt Will Perform Tub/Shower Transfer: Tub transfer;with supervision;ambulating;tub bench (pt will be aware of tub transfer benh for safe showering)  Plan      Co-evaluation                 AM-PAC OT 6 Clicks Daily Activity     Outcome Measure   Help from another person eating meals?: None Help from another person taking care of personal grooming?: A Little Help from another person toileting, which includes using toliet, bedpan, or urinal?: A Little Help from another person bathing (including washing, rinsing, drying)?: A Lot Help from another person to put on and taking off regular upper body clothing?: A Little Help from another person to put on and taking off regular lower body clothing?: A Lot 6 Click Score: 17    End of Session    OT Visit Diagnosis: Unsteadiness on feet (R26.81);Dizziness and giddiness (R42)   Activity Tolerance Patient tolerated treatment well   Patient Left in bed;with call bell/phone within reach   Nurse Communication Mobility status        Time: 9180-9171 OT Time Calculation (min): 9 min  Charges: OT General Charges $OT Visit: 1 Visit OT Treatments $Self Care/Home Management : 8-22 mins  Dick Laine, OTA Acute Rehabilitation Services  Office [phone]   Jeb LITTIE Laine 10/26/2024, 11:59 AM

## 2024-10-26 NOTE — Discharge Instructions (Signed)
 Thank you for allowing us  to be part of your care. You were hospitalized for COPD exacerbation. We treated you with nebulizers, inhalers, antibiotics and steroids.  See the changes in your medications and management of your chronic conditions below:  *For your constipation -We have STARTED you on these following medications:  - Linzess  290 mcg daily for constipation   You can also take MiraLAX  twice daily as needed  *For your possible GI bleed, now resolving -We have STARTED you on these following medications:  - Pantoprazole  40mg  daily  FOLLOW UP APPOINTMENTS: Please visit your PCP on 11/16/24 at 8:45am for a hospital follow-up  Please visit your gastroenterologist on 11/18/24 at 1:30pm   Please visit your pulmonologist on 11/20/24 at 10:00am  Please call your PCP or our clinic if you have any questions or concerns, we may be able to help and keep you from a long and expensive emergency room wait. Our clinic and after hours phone number is [phone]. The best time to call is Monday through Friday 9 am to 4 pm but there is always someone available 24/7 if you have an emergency. If you need medication refills please notify your pharmacy one week in advance and they will send us  a request.   We are glad you are feeling better,  Alfornia Light, DO Internal Medicine Inpatient Teaching Service at [facility]

## 2024-10-26 NOTE — Progress Notes (Signed)
 2D Echocardiogram performed.

## 2024-10-26 NOTE — Progress Notes (Signed)
 PT Cancellation Note  Patient Details Name: Watt Geiler MRN: [id number] DOB: 03-19-1961   Cancelled Treatment:    Reason Eval/Treat Not Completed: Other (comment) pt declined at this time to allow him time to eat lunch, will attempt to return later this afternoon.   Izetta Call, PT, DPT   Acute Rehabilitation Department Office [phone] Secure Chat Communication Preferred   Izetta JULIANNA Call 10/26/2024, 1:44 PM

## 2024-10-26 NOTE — Progress Notes (Signed)
 Physical Therapy Treatment Patient Details Name: Richard [name] MRN: [id number] DOB: 11/02/1960 Today's Date: 10/26/2024   History of Present Illness 64 y.o. male presents to [facility] 10/22/24 with acute exacerbation of COPD with asthma. PMH includes: COPD, asthma, HTN, HLD, CAD, DM II, L transmetatarsal amputation (09/18/23), R TKA (08/24/24).    PT Comments  The pt was agreeable to session with focus on stair training. Pt demos great improvement in activity tolerance and stability at this time, but continues to use RW for BUE support and improved endurance with hallway ambulation. Pt benefits from CGA with stairs and BUE support, but demos good stability with BUE support. Pt completed ~153ft mobility x2 and 5 step ups + 10 stairs with VSS on RA. Recommendations remain appropriate, pt making great progress towards goals.     If plan is discharge home, recommend the following: Assist for transportation;Help with stairs or ramp for entrance;A little help with walking and/or transfers;A little help with bathing/dressing/bathroom   Can travel by private vehicle        Equipment Recommendations  None recommended by PT    Recommendations for Other Services       Precautions / Restrictions Precautions Precautions: Fall Recall of Precautions/Restrictions: Intact Precaution/Restrictions Comments: watch BP Restrictions Weight Bearing Restrictions Per Provider Order: No     Mobility  Bed Mobility Overal bed mobility: Modified Independent                  Transfers Overall transfer level: Needs assistance Equipment used: None, Rolling walker (2 wheels) Transfers: Sit to/from Stand Sit to Stand: Supervision           General transfer comment: supervision in session with good stability on initial stand    Ambulation/Gait Ambulation/Gait assistance: Contact guard assist, Supervision Gait Distance (Feet): 100 Feet (+ 100 ft) Assistive device: None, Rolling walker (2 wheels) Gait  Pattern/deviations: Step-through pattern, Decreased stride length Gait velocity: WFL Gait velocity interpretation: 1.31 - 2.62 ft/sec, indicative of limited community ambulator   General Gait Details: pt with improved fluidity and speed with gait, no need for standing or seated rest break, VSS   Stairs Stairs: Yes Stairs assistance: Contact guard assist Stair Management: Two rails, Alternating pattern, Forwards Number of Stairs: 10 (+ 5 step-ups) General stair comments: HR 100-107bpm, CGA for safety alterating well       Balance Overall balance assessment: Needs assistance, Mild deficits observed, not formally tested Sitting-balance support: No upper extremity supported, Feet supported Sitting balance-Leahy Scale: Good     Standing balance support: Bilateral upper extremity supported, During functional activity, Reliant on assistive device for balance Standing balance-Leahy Scale: Good Standing balance comment: able to ambulate without UE support without LOB, BUE support for endurance                            Communication Communication Communication: No apparent difficulties  Cognition Arousal: Alert Behavior During Therapy: WFL for tasks assessed/performed   PT - Cognitive impairments: No apparent impairments                         Following commands: Intact      Cueing Cueing Techniques: Verbal cues  Exercises      General Comments General comments (skin integrity, edema, etc.): VSS on RA      Pertinent Vitals/Pain Pain Assessment Pain Assessment: No/denies pain Pain Location: chest Pain Descriptors / Indicators: Discomfort, Tightness Pain Intervention(s):  Monitored during session     PT Goals (current goals can now be found in the care plan section) Acute Rehab PT Goals Patient Stated Goal: to be able to dance at his wedding PT Goal Formulation: With patient Time For Goal Achievement: 11/06/24 Potential to Achieve Goals:  Good Progress towards PT goals: Progressing toward goals     AM-PAC PT 6 Clicks Mobility   Outcome Measure  Help needed turning from your back to your side while in a flat bed without using bedrails?: None Help needed moving from lying on your back to sitting on the side of a flat bed without using bedrails?: None Help needed moving to and from a bed to a chair (including a wheelchair)?: A Little Help needed standing up from a chair using your arms (e.g., wheelchair or bedside chair)?: A Little Help needed to walk in hospital room?: A Little Help needed climbing 3-5 steps with a railing? : A Lot 6 Click Score: 19    End of Session Equipment Utilized During Treatment: Gait belt Activity Tolerance: Patient tolerated treatment well Patient left: in bed;with call bell/phone within reach Nurse Communication: Mobility status PT Visit Diagnosis: Unsteadiness on feet (R26.81);Other abnormalities of gait and mobility (R26.89);Muscle weakness (generalized) (M62.81)     Time: 8559-8545 PT Time Calculation (min) (ACUTE ONLY): 14 min  Charges:    $Gait Training: 8-22 mins PT General Charges $$ ACUTE PT VISIT: 1 Visit                     Izetta Call, PT, DPT   Acute Rehabilitation Department Office [phone] Secure Chat Communication Preferred   Izetta JULIANNA Call 10/26/2024, 3:10 PM

## 2024-10-26 NOTE — Progress Notes (Signed)
 "  HD#4 SUBJECTIVE:  Patient Summary: Richard [name] is a 64 y.o. with a pertinent PMH of COPD, asthma, CAD, moderate AS/MS, HTN, and T2DM w/peripheral neuropathy who presented to the [facility] ED with dyspnea and cough and subsequently admitted for asthma exacerbation.    Overnight Events: No acute event overnight.   Interim History: Doing well, but overall frustrated with hospitalization. Patient is irritated that he came here for his breathing, but now he needs all these GI procedures. Discussed he will need to be NPO midnight tonight, though patient says he would like a few more days to adequately eat before fasting once again. Discussed with patient we are trying to keep his hospitalization as short as possible, which is why procedure is scheduled for next available time. Patient feels he currently has poor appetite, and concerned he will not be able to eat before midnight though agreeable after discussing procedure with patient's fiance, Richard [name], who voiced understanding. No other questions or concerns at this time.   OBJECTIVE:  Vital Signs: Vitals:   10/25/24 2352 10/26/24 0358 10/26/24 0808 10/26/24 1207  BP: 114/64 117/68 99/81 119/67  Pulse: 86 79 87 86  Resp: 18 17 20 14   Temp: 97.6 F (36.4 C) 98.5 F (36.9 C) 98.4 F (36.9 C) 97.9 F (36.6 C)  TempSrc: Oral Oral Oral Oral  SpO2: 100% 100% 100% 100%  Weight:      Height:       Supplemental O2: Room Air SpO2: 100 % O2 Flow Rate (L/min): 2 L/min  Filed Weights   10/22/24 0921  Weight: 103 kg    Intake/Output Summary (Last 24 hours) at 10/26/2024 1256 Last data filed at 10/25/2024 2030 Gross per 24 hour  Intake 720 ml  Output --  Net 720 ml   Net IO Since Admission: -914.66 mL [10/26/24 1256]  Physical Exam: Pleasant, well-appearing. Expiratory wheezing on lung exam, though improved clarity, able to speak in full sentences without intermittent bouts of cough. RRR, crescendo-decrescendo systolic murmur  RUSB. Abdomen soft, mildly distended, normal bowel sounds.  Patient Lines/Drains/Airways Status     Active Line/Drains/Airways     Name Placement date Placement time Site Days   Peripheral IV 10/22/24 20 G Anterior;Distal;Left;Upper Arm 10/22/24  1409  Arm  4   Peripheral IV 10/23/24 20 G 1.88 Anterior;Proximal;Right Forearm 10/23/24  0907  Forearm  3   Wound 08/24/24 1403 Surgical Closed Surgical Incision Knee Right 08/24/24  1403  Knee  63           Pertinent labs and imaging:     Latest Ref Rng & Units 10/26/2024    8:14 AM 10/26/2024    3:15 AM 10/25/2024    6:00 AM  CBC  WBC 4.0 - 10.5 K/uL 8.8     Hemoglobin 13.0 - 17.0 g/dL 9.0  8.3  8.3   Hematocrit 39.0 - 52.0 % 28.2  25.6  25.3   Platelets 150 - 400 K/uL 252          Latest Ref Rng & Units 10/26/2024    8:14 AM 10/25/2024    6:00 AM 10/24/2024    3:20 AM  CMP  Glucose 70 - 99 mg/dL 846  73  874   BUN 8 - 23 mg/dL 20  31  65   Creatinine 0.61 - 1.24 mg/dL 9.11  9.26  8.88   Sodium 135 - 145 mmol/L 137  140  134   Potassium 3.5 - 5.1 mmol/L 4.0  3.9  4.3   Chloride 98 - 111 mmol/L 102  105  103   CO2 22 - 32 mmol/L 25  24  21    Calcium  8.9 - 10.3 mg/dL 8.8  8.6  8.6   Total Protein 6.5 - 8.1 g/dL   5.8   Total Bilirubin 0.0 - 1.2 mg/dL   <9.7   Alkaline Phos 38 - 126 U/L   84   AST 15 - 41 U/L   11   ALT 0 - 44 U/L   12     ECHOCARDIOGRAM COMPLETE BUBBLE STUDY Result Date: 10/26/2024    ECHOCARDIOGRAM REPORT   Patient Name:   Richard [name] Date of Exam: 10/26/2024 Medical Rec #:  [id number]       Height:       75.0 in Accession #:    [id number]      Weight:       227.1 lb Date of Birth:  January 08, 1961        BSA:          2.316 m Patient Age:    63 years        BP:           99/81 mmHg Patient Gender: M               HR:           85 bpm. Exam Location:  Inpatient Procedure: 2D Echo, Cardiac Doppler, Color Doppler and Saline Contrast Bubble            Study (Both Spectral and Color Flow Doppler were utilized during             procedure). Indications:    Aortic stenosis  History:        Patient has prior history of Echocardiogram examinations, most                 recent 11/29/2023. CAD, COPD; Risk Factors:Hypertension and                 Diabetes.  Sonographer:    Meagan Baucom RDCS, FE, PE Referring Phys: [id number] JULIE ANNE WILLIAMS IMPRESSIONS  1. Left ventricular ejection fraction, by estimation, is 60 to 65%. The left ventricle has normal function. The left ventricle has no regional wall motion abnormalities. There is moderate asymmetric left ventricular hypertrophy of the basal and septal segments. Left ventricular diastolic parameters are indeterminate.  2. Right ventricular systolic function is normal. The right ventricular size is normal.  3. Left atrial size was mildly dilated.  4. The mitral valve is degenerative. Trivial mitral valve regurgitation. Mild mitral stenosis. Severe mitral annular calcification.  5. Compared to prior echo 11/29/23 AS a bit worse mean graident 12-> 21 mmHg peak 22.1 -> 36.7 mmHg AVA 2.49-> 1.8 cm2 and DVI 0.38-> 0.37 . The aortic valve is tricuspid. There is moderate calcification of the aortic valve. There is moderate thickening of the aortic valve. Aortic valve regurgitation is not visualized. Moderate aortic valve stenosis.  6. The inferior vena cava is normal in size with greater than 50% respiratory variability, suggesting right atrial pressure of 3 mmHg.  7. Agitated saline contrast bubble study was negative, with no evidence of any interatrial shunt. FINDINGS  Left Ventricle: Left ventricular ejection fraction, by estimation, is 60 to 65%. The left ventricle has normal function. The left ventricle has no regional wall motion abnormalities. Strain was performed and the global longitudinal strain is indeterminate. The left ventricular internal cavity size was  normal in size. There is moderate asymmetric left ventricular hypertrophy of the basal and septal segments. Left ventricular  diastolic parameters are indeterminate. Right Ventricle: The right ventricular size is normal. No increase in right ventricular wall thickness. Right ventricular systolic function is normal. Left Atrium: Left atrial size was mildly dilated. Right Atrium: Right atrial size was normal in size. Pericardium: There is no evidence of pericardial effusion. Mitral Valve: The mitral valve is degenerative in appearance. There is moderate thickening of the mitral valve leaflet(s). There is moderate calcification of the mitral valve leaflet(s). Severe mitral annular calcification. Trivial mitral valve regurgitation. Mild mitral valve stenosis. MV peak gradient, 10.4 mmHg. The mean mitral valve gradient is 6.0 mmHg. Tricuspid Valve: The tricuspid valve is normal in structure. Tricuspid valve regurgitation is mild . No evidence of tricuspid stenosis. Aortic Valve: Compared to prior echo 11/29/23 AS a bit worse mean graident 12-> 21 mmHg peak 22.1 -> 36.7 mmHg AVA 2.49-> 1.8 cm2 and DVI 0.38-> 0.37. The aortic valve is tricuspid. There is moderate calcification of the aortic valve. There is moderate thickening of the aortic valve. Aortic valve regurgitation is not visualized. Moderate aortic stenosis is present. Aortic valve mean gradient measures 21.0 mmHg. Aortic valve peak gradient measures 36.7 mmHg. Aortic valve area, by VTI measures 1.95 cm. Pulmonic Valve: The pulmonic valve was normal in structure. Pulmonic valve regurgitation is trivial. No evidence of pulmonic stenosis. Aorta: The aortic root is normal in size and structure. Venous: The inferior vena cava is normal in size with greater than 50% respiratory variability, suggesting right atrial pressure of 3 mmHg. IAS/Shunts: No atrial level shunt detected by color flow Doppler. Agitated saline contrast was given intravenously to evaluate for intracardiac shunting. Agitated saline contrast bubble study was negative, with no evidence of any interatrial shunt. Additional  Comments: 3D was performed not requiring image post processing on an independent workstation and was indeterminate.  LEFT VENTRICLE PLAX 2D LVIDd:         4.95 cm   Diastology LVIDs:         3.35 cm   LV e' medial:    5.87 cm/s LV PW:         1.20 cm   LV E/e' medial:  23.7 LV IVS:        1.25 cm   LV e' lateral:   5.55 cm/s LVOT diam:     2.60 cm   LV E/e' lateral: 25.0 LV SV:         111 LV SV Index:   48 LVOT Area:     5.31 cm  RIGHT VENTRICLE RV S prime:     12.50 cm/s TAPSE (M-mode): 2.0 cm LEFT ATRIUM              Index        RIGHT ATRIUM           Index LA diam:        3.70 cm  1.60 cm/m   RA Area:     17.40 cm LA Vol (A2C):   105.0 ml 45.34 ml/m  RA Volume:   45.10 ml  19.47 ml/m LA Vol (A4C):   71.6 ml  30.91 ml/m LA Biplane Vol: 86.4 ml  37.31 ml/m  AORTIC VALVE AV Area (Vmax):    1.80 cm AV Area (Vmean):   1.74 cm AV Area (VTI):     1.95 cm AV Vmax:           303.00 cm/s  AV Vmean:          213.333 cm/s AV VTI:            0.570 m AV Peak Grad:      36.7 mmHg AV Mean Grad:      21.0 mmHg LVOT Vmax:         103.00 cm/s LVOT Vmean:        70.100 cm/s LVOT VTI:          0.209 m LVOT/AV VTI ratio: 0.37  AORTA Ao Root diam: 3.50 cm Ao Asc diam:  3.50 cm MITRAL VALVE                TRICUSPID VALVE MV Area (PHT): 3.03 cm     TR Peak grad:   12.2 mmHg MV Area VTI:   2.30 cm     TR Vmax:        175.00 cm/s MV Peak grad:  10.4 mmHg MV Mean grad:  6.0 mmHg     SHUNTS MV Vmax:       1.62 m/s     Systemic VTI:  0.21 m MV Vmean:      116.5 cm/s   Systemic Diam: 2.60 cm MV Decel Time: 250 msec MV E velocity: 139.00 cm/s MV A velocity: 160.00 cm/s MV E/A ratio:  0.87 Maude Emmer MD Electronically signed by Maude Emmer MD Signature Date/Time: 10/26/2024/9:52:08 AM    Final    ASSESSMENT/PLAN:  Assessment: Principal Problem:   Acute exacerbation of COPD with asthma (HCC) Active Problems:   Asthma-COPD overlap syndrome (HCC)   Diabetic neuropathy, painful (HCC)   Acute on chronic anemia   Anterior  chest wall pain   S/P TKR (total knee replacement), right   Constipation   DM (diabetes mellitus), type 2 (HCC)   Leukopenia   Azotemia   Electrolyte imbalance (hyperkalemia, hyponatremia)   Angiectasia of gastrointestinal tract  Conroy Goracke is a 64 y.o. person with a PMH of COPD, asthma, CAD, moderate AS/MS, HTN, and T2DM w/peripheral neuropathy who presented to the [facility] ED with dyspnea and cough and subsequently admitted for asthma/COPD exacerbation, he was also found to have acute on chronic anemia.  Plan: #Asthma-COPD overlap syndrome #Acute exacerbation of COPD with asthma #Asthma exacerbation Much improved since admission; HDS on RA. Treating asthma/COPD exacerbation with azithromycin  (3-day course completed), steroid, scheduled DuoNeb, and home inhalers. Continues to have intermittent cough with poor sputum expectoration despite Tussionex, so will adjust this to PRN dosing to encourage better cough. - Continue home Duoneb Q4hrs, Pulmicort  and Brovana  - Continue prednisone  40 mg day 4/5. - Continue scheduled Mucinex  DM dose to every 4 hours - Change scheduled Tussionex to as needed to encourage better cough  Acute on chronic normocytic anemia Concern for UGI S/p 2 units PRBC. GI following, suspect intermittent bleeding from gastric and proximal small bowel well angioectasias as a source of patient's anemia and bleeding, GI recommending small bowel enteroscopy for evaluation of additional angioectasias.  H&H today stable 8.3, 25.6.  Holding Lovenox  and clopidogrel  in setting of acute anemia. - GI following, appreciate recs. 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 - Monitor CBC, transfuse if Hb <7 - follow H&H - NPO @ midnight - Small bowel enteroscopy scheduled tomorrow  T2DM Diabetic neuropathy Blood glucose much improved; fasting 137,  postprandial 212.  He is on Semglee  25 units  daily. -Continue metformin  1000 mg and empagliflozin  25 mg -Continue SSI; resistant scale. -Lyrica  100 mg 3 times daily for diabetic neuropathy  # Constipation Continued constipation, last BM reported on 1/16, now status post 3 doses of MiraLAX , 1 dose of senna.  Gastroenterology starting patient on Linzess  for constipation management.  May need to consider reevaluation of Tussidex if constipation continues throughout admission. - Start Linzess  290 mcg daily  # Chest wall Pain # Cough Currently on Tussionex 5 mL BID and Mucinex  DM 5 mL Q4 h PRN. Will continue scheduled Mucinex  DM as he continues to have difficulty with sputum expectoration, and adjsut Tussionex to PRN, as he feels this may be having a difficult time getting out the sputum.   Osteoarthritis of right knee S/p right total knee arthroplasty 6 weeks ago, knee pain much better controlled now. - Continue to monitor  HTN CAD Moderate AS/MS Patient has a history of CAD and moderate AS/MS, low suspicion for ACS, given normal troponin, EKG without ischemic changes.  Ongoing chest pain is secondary to recurrent bouts of coughing. - continue home oral atorvastatin  40 mg daily - continue holding home oral Plavix  75 mg daily - echocardiogram showed worsening of AS, moderate calcification and thickening, plan to discuss these results with patient before discharge  Best Practice: Diet: regular, NPO at midnight IVF: Fluids: None VTE: Place and maintain sequential compression device Start: 10/23/24 1731 Code: Full  Disposition planning: Therapy Recs: Outpatient PT Family Contact: Fianc, called and notified. DISPO: Anticipated discharge in 1-3 days to Home pending further workup.  Signature: Alfornia Light, DO Internal Medicine Resident, PGY-1  12:56 PM, 10/26/2024  "

## 2024-10-27 ENCOUNTER — Encounter (HOSPITAL_COMMUNITY): Payer: Self-pay | Admitting: Internal Medicine

## 2024-10-27 ENCOUNTER — Inpatient Hospital Stay (HOSPITAL_COMMUNITY): Admitting: Anesthesiology

## 2024-10-27 ENCOUNTER — Encounter (HOSPITAL_COMMUNITY): Admission: EM | Disposition: A | Payer: Self-pay | Source: Home / Self Care | Attending: Internal Medicine

## 2024-10-27 DIAGNOSIS — I2510 Atherosclerotic heart disease of native coronary artery without angina pectoris: Secondary | ICD-10-CM | POA: Diagnosis not present

## 2024-10-27 DIAGNOSIS — T182XXA Foreign body in stomach, initial encounter: Secondary | ICD-10-CM

## 2024-10-27 DIAGNOSIS — Z87891 Personal history of nicotine dependence: Secondary | ICD-10-CM | POA: Diagnosis not present

## 2024-10-27 DIAGNOSIS — K31819 Angiodysplasia of stomach and duodenum without bleeding: Secondary | ICD-10-CM

## 2024-10-27 DIAGNOSIS — J449 Chronic obstructive pulmonary disease, unspecified: Secondary | ICD-10-CM

## 2024-10-27 HISTORY — PX: ENTEROSCOPY: SHX5533

## 2024-10-27 HISTORY — PX: HOT HEMOSTASIS: SHX5433

## 2024-10-27 LAB — GLUCOSE, CAPILLARY
Glucose-Capillary: 138 mg/dL — ABNORMAL HIGH (ref 70–99)
Glucose-Capillary: 92 mg/dL (ref 70–99)
Glucose-Capillary: 144 mg/dL — ABNORMAL HIGH (ref 70–99)
Glucose-Capillary: 313 mg/dL — ABNORMAL HIGH (ref 70–99)
Glucose-Capillary: 191 mg/dL — ABNORMAL HIGH (ref 70–99)

## 2024-10-27 LAB — HEMOGLOBIN AND HEMATOCRIT, BLOOD
HCT: 26.4 % — ABNORMAL LOW (ref 39.0–52.0)
Hemoglobin: 8.4 g/dL — ABNORMAL LOW (ref 13.0–17.0)

## 2024-10-27 MED ORDER — GLYCOPYRROLATE 0.2 MG/ML IJ SOLN
INTRAMUSCULAR | Status: DC | PRN
  Administered 2024-10-27: .2 mg via INTRAVENOUS

## 2024-10-27 MED ORDER — SUCCINYLCHOLINE CHLORIDE 200 MG/10ML IV SOSY
PREFILLED_SYRINGE | INTRAVENOUS | Status: DC | PRN
  Administered 2024-10-27: 140 mg via INTRAVENOUS

## 2024-10-27 MED ORDER — IPRATROPIUM-ALBUTEROL 0.5-2.5 (3) MG/3ML IN SOLN
RESPIRATORY_TRACT | Status: AC
  Filled 2024-10-27: qty 3

## 2024-10-27 MED ORDER — PROPOFOL 10 MG/ML IV BOLUS
INTRAVENOUS | Status: DC | PRN
  Administered 2024-10-27: 40 mg via INTRAVENOUS
  Administered 2024-10-27: 30 mg via INTRAVENOUS
  Administered 2024-10-27: 40 mg via INTRAVENOUS

## 2024-10-27 MED ORDER — ONDANSETRON HCL 4 MG/2ML IJ SOLN
INTRAMUSCULAR | Status: DC | PRN
  Administered 2024-10-27: 4 mg via INTRAVENOUS

## 2024-10-27 MED ORDER — HYDROCOD POLI-CHLORPHE POLI ER 10-8 MG/5ML PO SUER
5.0000 mL | Freq: Two times a day (BID) | ORAL | Status: DC | PRN
  Administered 2024-10-27: 5 mL via ORAL
  Filled 2024-10-27: qty 5

## 2024-10-27 MED ORDER — PHENYLEPHRINE 80 MCG/ML (10ML) SYRINGE FOR IV PUSH (FOR BLOOD PRESSURE SUPPORT)
PREFILLED_SYRINGE | INTRAVENOUS | Status: DC | PRN
  Administered 2024-10-27 (×3): 160 ug via INTRAVENOUS

## 2024-10-27 MED ORDER — PROPOFOL 500 MG/50ML IV EMUL
INTRAVENOUS | Status: DC | PRN
  Administered 2024-10-27: 125 ug/kg/min via INTRAVENOUS

## 2024-10-27 MED ORDER — DEXAMETHASONE SOD PHOSPHATE PF 10 MG/ML IJ SOLN
INTRAMUSCULAR | Status: DC | PRN
  Administered 2024-10-27: 10 mg via INTRAVENOUS

## 2024-10-27 MED ORDER — LIDOCAINE 2% (20 MG/ML) 5 ML SYRINGE
INTRAMUSCULAR | Status: DC | PRN
  Administered 2024-10-27: 60 mg via INTRAVENOUS
  Administered 2024-10-27: 50 mg via INTRAVENOUS

## 2024-10-27 NOTE — Progress Notes (Signed)
 "  HD#5 SUBJECTIVE:  Patient Summary: Richard [name] is a 64 y.o. with a pertinent PMH of COPD, asthma, CAD, moderate AS/MS, HTN, and T2DM w/peripheral neuropathy who presented to the [facility] ED with dyspnea and cough and subsequently admitted for asthma exacerbation.    Overnight Events: No acute event overnight, patient has been NPO for procedure today  Interim History: Patient was seen resting comfortably in bed before his planned procedure this morning. Patient reports no new coughing events or SOB, CP, weakness, fatigue or dizziness. Patient denies any anxieties for the procedure, and he says he would just like to get this over with. Patient says he had a bowel movement last evening, concerning for melena, no other new events since this. No new questions or concerns at this time. If repeat H&H stable, patient will likely be medically ready to go home tomorrow given significant improvement in respiratory status since admission.  OBJECTIVE:  Vital Signs: Vitals:   10/26/24 1953 10/26/24 2041 10/26/24 2355 10/27/24 0415  BP: 127/68 127/68 (!) 100/58 (!) 117/56  Pulse: 92 96 85 77  Resp: 17 18 (!) 21 17  Temp: 97.6 F (36.4 C)  98 F (36.7 C) 98.3 F (36.8 C)  TempSrc: Oral  Oral Oral  SpO2: 100% 99% 100% 100%  Weight:      Height:       Supplemental O2: Room Air SpO2: 100 % O2 Flow Rate (L/min): 2 L/min  Filed Weights   10/22/24 0921  Weight: 103 kg    Intake/Output Summary (Last 24 hours) at 10/27/2024 9367 Last data filed at 10/27/2024 0600 Gross per 24 hour  Intake 474.81 ml  Output --  Net 474.81 ml   Net IO Since Admission: -439.85 mL [10/27/24 0632]  Physical Exam: General: Pleasant, chronically-ill-appearing gentleman Resp: No wheezing on lung exam, improved clarity, able to speak in full sentences without intermittent bouts of cough Cardiac: RRR, crescendo-decrescendo systolic murmur RUSB. Abd: Abdomen soft, mildly distended, normal bowel  sounds. Extremities: well-perfused, non edematous, not TTP  Patient Lines/Drains/Airways Status     Active Line/Drains/Airways     Name Placement date Placement time Site Days   Peripheral IV 10/22/24 20 G Anterior;Distal;Left;Upper Arm 10/22/24  1409  Arm  5   Wound 08/24/24 1403 Surgical Closed Surgical Incision Knee Right 08/24/24  1403  Knee  64           Pertinent labs and imaging:     Latest Ref Rng & Units 10/27/2024    3:21 AM 10/26/2024    8:14 AM 10/26/2024    3:15 AM  CBC  WBC 4.0 - 10.5 K/uL  8.8    Hemoglobin 13.0 - 17.0 g/dL 8.4  9.0  8.3   Hematocrit 39.0 - 52.0 % 26.4  28.2  25.6   Platelets 150 - 400 K/uL  252         Latest Ref Rng & Units 10/26/2024    8:14 AM 10/25/2024    6:00 AM 10/24/2024    3:20 AM  CMP  Glucose 70 - 99 mg/dL 846  73  874   BUN 8 - 23 mg/dL 20  31  65   Creatinine 0.61 - 1.24 mg/dL 9.11  9.26  8.88   Sodium 135 - 145 mmol/L 137  140  134   Potassium 3.5 - 5.1 mmol/L 4.0  3.9  4.3   Chloride 98 - 111 mmol/L 102  105  103   CO2 22 - 32 mmol/L 25  24  21   Calcium  8.9 - 10.3 mg/dL 8.8  8.6  8.6   Total Protein 6.5 - 8.1 g/dL   5.8   Total Bilirubin 0.0 - 1.2 mg/dL   <9.7   Alkaline Phos 38 - 126 U/L   84   AST 15 - 41 U/L   11   ALT 0 - 44 U/L   12     ECHOCARDIOGRAM COMPLETE BUBBLE STUDY Result Date: 10/26/2024    ECHOCARDIOGRAM REPORT   Patient Name:   Richard [name] Date of Exam: 10/26/2024 Medical Rec #:  [id number]       Height:       75.0 in Accession #:    [id number]      Weight:       227.1 lb Date of Birth:  September 11, 1961        BSA:          2.316 m Patient Age:    63 years        BP:           99/81 mmHg Patient Gender: M               HR:           85 bpm. Exam Location:  Inpatient Procedure: 2D Echo, Cardiac Doppler, Color Doppler and Saline Contrast Bubble            Study (Both Spectral and Color Flow Doppler were utilized during            procedure). Indications:    Aortic stenosis  History:        Patient has prior  history of Echocardiogram examinations, most                 recent 11/29/2023. CAD, COPD; Risk Factors:Hypertension and                 Diabetes.  Sonographer:    Meagan Baucom RDCS, FE, PE Referring Phys: [id number] JULIE ANNE WILLIAMS IMPRESSIONS  1. Left ventricular ejection fraction, by estimation, is 60 to 65%. The left ventricle has normal function. The left ventricle has no regional wall motion abnormalities. There is moderate asymmetric left ventricular hypertrophy of the basal and septal segments. Left ventricular diastolic parameters are indeterminate.  2. Right ventricular systolic function is normal. The right ventricular size is normal.  3. Left atrial size was mildly dilated.  4. The mitral valve is degenerative. Trivial mitral valve regurgitation. Mild mitral stenosis. Severe mitral annular calcification.  5. Compared to prior echo 11/29/23 AS a bit worse mean graident 12-> 21 mmHg peak 22.1 -> 36.7 mmHg AVA 2.49-> 1.8 cm2 and DVI 0.38-> 0.37 . The aortic valve is tricuspid. There is moderate calcification of the aortic valve. There is moderate thickening of the aortic valve. Aortic valve regurgitation is not visualized. Moderate aortic valve stenosis.  6. The inferior vena cava is normal in size with greater than 50% respiratory variability, suggesting right atrial pressure of 3 mmHg.  7. Agitated saline contrast bubble study was negative, with no evidence of any interatrial shunt. FINDINGS  Left Ventricle: Left ventricular ejection fraction, by estimation, is 60 to 65%. The left ventricle has normal function. The left ventricle has no regional wall motion abnormalities. Strain was performed and the global longitudinal strain is indeterminate. The left ventricular internal cavity size was normal in size. There is moderate asymmetric left ventricular hypertrophy of the basal and septal segments. Left ventricular diastolic parameters are indeterminate.  Right Ventricle: The right ventricular size is normal. No  increase in right ventricular wall thickness. Right ventricular systolic function is normal. Left Atrium: Left atrial size was mildly dilated. Right Atrium: Right atrial size was normal in size. Pericardium: There is no evidence of pericardial effusion. Mitral Valve: The mitral valve is degenerative in appearance. There is moderate thickening of the mitral valve leaflet(s). There is moderate calcification of the mitral valve leaflet(s). Severe mitral annular calcification. Trivial mitral valve regurgitation. Mild mitral valve stenosis. MV peak gradient, 10.4 mmHg. The mean mitral valve gradient is 6.0 mmHg. Tricuspid Valve: The tricuspid valve is normal in structure. Tricuspid valve regurgitation is mild . No evidence of tricuspid stenosis. Aortic Valve: Compared to prior echo 11/29/23 AS a bit worse mean graident 12-> 21 mmHg peak 22.1 -> 36.7 mmHg AVA 2.49-> 1.8 cm2 and DVI 0.38-> 0.37. The aortic valve is tricuspid. There is moderate calcification of the aortic valve. There is moderate thickening of the aortic valve. Aortic valve regurgitation is not visualized. Moderate aortic stenosis is present. Aortic valve mean gradient measures 21.0 mmHg. Aortic valve peak gradient measures 36.7 mmHg. Aortic valve area, by VTI measures 1.95 cm. Pulmonic Valve: The pulmonic valve was normal in structure. Pulmonic valve regurgitation is trivial. No evidence of pulmonic stenosis. Aorta: The aortic root is normal in size and structure. Venous: The inferior vena cava is normal in size with greater than 50% respiratory variability, suggesting right atrial pressure of 3 mmHg. IAS/Shunts: No atrial level shunt detected by color flow Doppler. Agitated saline contrast was given intravenously to evaluate for intracardiac shunting. Agitated saline contrast bubble study was negative, with no evidence of any interatrial shunt. Additional Comments: 3D was performed not requiring image post processing on an independent workstation and was  indeterminate.  LEFT VENTRICLE PLAX 2D LVIDd:         4.95 cm   Diastology LVIDs:         3.35 cm   LV e' medial:    5.87 cm/s LV PW:         1.20 cm   LV E/e' medial:  23.7 LV IVS:        1.25 cm   LV e' lateral:   5.55 cm/s LVOT diam:     2.60 cm   LV E/e' lateral: 25.0 LV SV:         111 LV SV Index:   48 LVOT Area:     5.31 cm  RIGHT VENTRICLE RV S prime:     12.50 cm/s TAPSE (M-mode): 2.0 cm LEFT ATRIUM              Index        RIGHT ATRIUM           Index LA diam:        3.70 cm  1.60 cm/m   RA Area:     17.40 cm LA Vol (A2C):   105.0 ml 45.34 ml/m  RA Volume:   45.10 ml  19.47 ml/m LA Vol (A4C):   71.6 ml  30.91 ml/m LA Biplane Vol: 86.4 ml  37.31 ml/m  AORTIC VALVE AV Area (Vmax):    1.80 cm AV Area (Vmean):   1.74 cm AV Area (VTI):     1.95 cm AV Vmax:           303.00 cm/s AV Vmean:          213.333 cm/s AV VTI:  0.570 m AV Peak Grad:      36.7 mmHg AV Mean Grad:      21.0 mmHg LVOT Vmax:         103.00 cm/s LVOT Vmean:        70.100 cm/s LVOT VTI:          0.209 m LVOT/AV VTI ratio: 0.37  AORTA Ao Root diam: 3.50 cm Ao Asc diam:  3.50 cm MITRAL VALVE                TRICUSPID VALVE MV Area (PHT): 3.03 cm     TR Peak grad:   12.2 mmHg MV Area VTI:   2.30 cm     TR Vmax:        175.00 cm/s MV Peak grad:  10.4 mmHg MV Mean grad:  6.0 mmHg     SHUNTS MV Vmax:       1.62 m/s     Systemic VTI:  0.21 m MV Vmean:      116.5 cm/s   Systemic Diam: 2.60 cm MV Decel Time: 250 msec MV E velocity: 139.00 cm/s MV A velocity: 160.00 cm/s MV E/A ratio:  0.87 Maude Emmer MD Electronically signed by Maude Emmer MD Signature Date/Time: 10/26/2024/9:52:08 AM    Final    ASSESSMENT/PLAN:  Assessment: Principal Problem:   Acute on chronic blood loss anemia Active Problems:   Asthma-COPD overlap syndrome (HCC)   Anterior chest wall pain   Constipation   Aortic stenosis, moderate   Acute exacerbation of COPD with asthma (HCC)   DM (diabetes mellitus), type 2 (HCC)   Angiectasia of  gastrointestinal tract  Richard [name] is a 64 y.o. person with a PMH of COPD, asthma, CAD, moderate AS/MS, HTN, and T2DM w/peripheral neuropathy who presented to the [facility] ED with dyspnea and cough and subsequently admitted for asthma/COPD exacerbation, he was also found to have acute on chronic anemia.  Plan: # Acute on chronic normocytic anemia # Concern for UGI S/p 2 units PRBC. GI following, suspect intermittent bleeding from gastric and proximal small bowel well angioectasias as a source of patient's anemia and bleeding. H&H today stable 8.4, 26.4. Small bowel enteroscopy today for evaluation of additional angioectasias did not find any significant pathology, and treated the one non-bleeding angioectasia with argon plasma coagulation. Of note, there was a moderate amount of food in his stomach, concerning for possible gastroparesis/gastrointestinal motility. Given patient report of black tarry stool last night, could be possible there was component of UGI bleed but H&H stabilized and no active bleeding appreciated on more invasive examination. - GI following, appreciate recs. - capsule endoscopy - 1 nonbleeding angioectasia in the duodenal bulb, 1 mucosal break vs diminutive angioectasia in proximal duodenum - continue PO Protonix  40 mg BID - Monitor CBC, transfuse if Hb <7 - follow H&H - small bowel enteroscopy without significant pathology, there is a single non-bleeding angiectasia in the duodenum that was treated with argon plasma coagulation  #Asthma-COPD overlap syndrome #Acute exacerbation of COPD with asthma #Asthma exacerbation Much improved since admission; HDS on RA. Treating asthma/COPD exacerbation with azithromycin  (3-day course completed), steroid, scheduled DuoNeb, and home inhalers. Continues to have intermittent cough though feels he is getting better sputum expectoration and frequency of coughing bouts have decreased. - Continue home Duoneb Q4hrs, Pulmicort  and  Brovana  - Continue prednisone  40 mg day 5/5. - Continue Tussionex as needed   # Constipation, resolving Patient reported BM last night 1/19. Patient says stool initially hard,  compact, typical consistency for him, then had runny, black, tarry stool, consistent  with melena concerning for UGI bleed. No stools or identifiable active bleeding since. - continue Linzess  290 mcg daily  # T2DM # Diabetic neuropathy Blood glucose much improved, will continue this; fasting 138, postprandial 233.  He is on Semglee  25 units daily. -Continue metformin  1000 mg and empagliflozin  25 mg -Continue SSI; resistant scale -continue lyrica  100 mg 3 times daily for diabetic neuropathy  # Chest wall Pain # Cough Currently on Tussionex 5 mL BID PRN and Mucinex  DM 5 mL Q4 h PRN. Will continue these as currently prescribed given patient is having an easier time with sputum expectoration and coughing bouts.  # HTN # CAD # PAD # Moderate AS/MS Patient has a history of CAD and moderate AS/MS, low suspicion for ACS, given normal troponin, EKG without ischemic changes.  Ongoing chest pain is secondary to recurrent bouts of coughing, though this is continuing to improve as coughing bouts are decreasing. Patient taking Plavix  for PAD which has been held in the setting of unclear acute anemia, can resume once his H&H comes back and is stable tomorrow. - continue home oral atorvastatin  40 mg daily - continue holding home oral Plavix  75 mg daily - echocardiogram showed worsening of AS, moderate calcification and thickening, plan to discuss these results with patient before discharge  # Osteoarthritis of right knee S/p right total knee arthroplasty 6 weeks ago, knee pain much better controlled now. - Continue to monitor  Best Practice: Diet: NPO IVF: Fluids: None VTE: Place and maintain sequential compression device Start: 10/23/24 1731 Code: Full  Disposition planning: Therapy Recs: Outpatient PT Family Contact:  Fianc, called and notified. DISPO: Anticipated discharge tomorrow to Home pending further workup.  Signature: Alfornia Light, DO Internal Medicine Resident, PGY-1  6:32 AM, 10/27/2024  "

## 2024-10-27 NOTE — Progress Notes (Signed)
" °   10/27/24 1409  TOC Brief Assessment  Insurance and Status Reviewed  Patient has primary care physician Yes  Home environment has been reviewed apartment  Prior level of function: self  Prior/Current Home Services No current home services  Social Drivers of Health Review SDOH reviewed no interventions necessary  Readmission risk has been reviewed Yes  Transition of care needs transition of care needs identified, TOC will continue to follow    GI following, note current therapy recommendations for outpt- Inpatient Care Management (ICM) has reviewed patient and will continue to monitor patient advancement through interdisciplinary progression rounds. If new patient transition needs arise, please place a ICM (CM/CSW) consult. "

## 2024-10-27 NOTE — Op Note (Signed)
 [facility] Patient Name: Richard [name] Procedure Date : 10/27/2024 MRN: [id number] Attending MD: Inocente Hausen , MD, [id number] Date of Birth: 19-Aug-1961 CSN: [id number] Age: 64 Admit Type: Inpatient Procedure:                Small bowel enteroscopy Indications:              Iron  deficiency anemia, Follow-up of angioectasia                            of small bowel and stomach Providers:                Inocente Hausen, MD, Darleene Bare, RN, Corky Czech,                            Technician Referring MD:              Medicines:                General Anesthesia - started with MAC and changed                            to GA after food noted in stomach necessitating                            intubation Complications:            No immediate complications. Estimated blood loss:                            Minimal. Estimated Blood Loss:     Estimated blood loss was minimal. Procedure:                Pre-Anesthesia Assessment:                           - Prior to the procedure, a History and Physical                            was performed, and patient medications and                            allergies were reviewed. The patient's tolerance of                            previous anesthesia was also reviewed. The risks                            and benefits of the procedure and the sedation                            options and risks were discussed with the patient.                            All questions were answered, and informed consent  was obtained. Prior Anticoagulants: The patient has                            taken Plavix  (clopidogrel ), last dose was 4 days                            prior to procedure; Lovenox  last dose 4 days prior                            to procedure. ASA Grade Assessment: III - A patient                            with severe systemic disease. After reviewing the                            risks and benefits,  the patient was deemed in                            satisfactory condition to undergo the procedure.                           After obtaining informed consent, the endoscope was                            passed under direct vision. Throughout the                            procedure, the patient's blood pressure, pulse, and                            oxygen  saturations were monitored continuously. The                            [id number]-H190TL ([id number] Olympus colonoscope was                            introduced through the mouth and advanced to the                            proximal jejunum. The small bowel enteroscopy was                            accomplished without difficulty. The patient                            tolerated the procedure well.                           Due to a moderate amount of food residue in the                            patient's stomach at the time of today's procedure,  intubation was performed for airway protection. Scope In: Scope Out: Findings:      The examined esophagus was normal.      A medium amount of food (residue) was found in the cardia, in the       gastric fundus and in the gastric body. Food residue in this area       obscured views and angioectasia seen during EGD 09/2024 was not       visualized.      The gastric antrum and pylorus were normal.      A single angioectasia with no bleeding was found in the duodenal bulb. 2       hemostatic clips were present adjacent to the lesion. Coagulation for       tissue destruction using argon plasma at 2 liters/minute and 20 watts       was successful.      Food residue was present at the duodenal sweep obscuring views.      There was no evidence of significant pathology in the second portion of       the duodenum, in the third portion of the duodenum and in the fourth       portion of the duodenum.      There was no evidence of significant pathology in the proximal  jejunum. Impression:               - Normal esophagus.                           - A medium amount of food (residue) in the stomach.                            May have a component of gastroparesis/generalized                            gastrointestinal motility disorder given retained                            gastric contents and constipation                           - Normal antrum and pylorus.                           - A single non-bleeding angioectasia in the                            duodenum. Treated with argon plasma coagulation                            (APC).                           - Normal second portion of the duodenum, third                            portion of the duodenum and fourth portion of the  duodenum.                           - The examined portion of the jejunum was normal.                           - No specimens collected. Recommendation:           - Return patient to hospital ward for ongoing care.                           - Decisions regarding resumption of anticoagulation                            deferred to primary team                           - Monitor respiratory status post today's procedure                            which required intubation for airway protection.                           - Continue iron  supplementation and consideration                            of outpatient iron  infusions                           - Patient may resume regular diet                           - Continue Linzess  290 mcg p.o. daily                           - Will schedule hospital follow-up clinic visit                            with Dr. Shila or APP Procedure Code(s):        --- Professional ---                           [phone], Small intestinal endoscopy, enteroscopy                            beyond second portion of duodenum, not including                            ileum; with ablation of tumor(s), polyp(s), or                             other lesion(s) not amenable to removal by hot                            biopsy forceps, bipolar cautery or snare technique Diagnosis Code(s):        --- Professional ---  K31.819, Angiodysplasia of stomach and duodenum                            without bleeding                           D50.9, Iron  deficiency anemia, unspecified                           K55.20, Angiodysplasia of colon without hemorrhage CPT copyright 2022 [employer]. All rights reserved. The codes documented in this report are preliminary and upon coder review may  be revised to meet current compliance requirements. Inocente Hausen, MD 10/27/2024 9:43:44 AM This report has been signed electronically. Number of Addenda: 0

## 2024-10-27 NOTE — Transfer of Care (Signed)
 Immediate Anesthesia Transfer of Care Note  Patient: Richard [name]  Procedure(s) Performed: ENTEROSCOPY EGD, WITH ARGON PLASMA COAGULATION  Patient Location: Endoscopy Unit  Anesthesia Type:General  Level of Consciousness: awake, alert , and oriented  Airway & Oxygen  Therapy: Patient Spontanous Breathing  Post-op Assessment: Report given to RN and Post -op Vital signs reviewed and stable  Post vital signs: Reviewed and stable  Last Vitals:  Vitals Value Taken Time  BP 100/80 10/27/24 09:40  Temp 36.6 C 10/27/24 09:40  Pulse 85 10/27/24 09:43  Resp 18 10/27/24 09:43  SpO2 99 % 10/27/24 09:43  Vitals shown include unfiled device data.  Last Pain:  Vitals:   10/27/24 0940  TempSrc: Temporal  PainSc: Asleep      Patients Stated Pain Goal: 0 (10/25/24 0800)  Complications: No notable events documented.
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
## 2024-10-27 NOTE — Anesthesia Postprocedure Evaluation (Signed)
"   Anesthesia Post Note  Patient: Richard [name]  Procedure(s) Performed: ENTEROSCOPY EGD, WITH ARGON PLASMA COAGULATION     Patient location during evaluation: Endoscopy Anesthesia Type: General Level of consciousness: patient cooperative and awake and alert Pain management: pain level controlled Vital Signs Assessment: post-procedure vital signs reviewed and stable Respiratory status: spontaneous breathing Cardiovascular status: stable Anesthetic complications: no Comments: Pt brief period of stridor post procedure. Duoneb given and pt relaxed, stridor dissipated.    No notable events documented.  Last Vitals:  Vitals:   10/27/24 1010 10/27/24 1117  BP: 120/67 (!) 103/55  Pulse: 79 84  Resp: 13 16  Temp:  36.5 C  SpO2: 100% 100%    Last Pain:  Vitals:   10/27/24 1117  TempSrc: Oral  PainSc:                  Norleen Pope      "

## 2024-10-27 NOTE — Progress Notes (Signed)
 Mobility Specialist Progress Note;   10/27/24 1328  Mobility  Activity Pivoted/transferred from bed to chair;Ambulated independently  Level of Assistance Standby assist, set-up cues, supervision of patient - no hands on  Assistive Device Front wheel walker  Distance Ambulated (ft) 100 ft  Activity Response Tolerated fair  Mobility Referral Yes  Mobility visit 1 Mobility  Mobility Specialist Start Time (ACUTE ONLY) 1328  Mobility Specialist Stop Time (ACUTE ONLY) 1340  Mobility Specialist Time Calculation (min) (ACUTE ONLY) 12 min   Pt sitting on EoB upon arrival, agreeable to mobility. Required no physical assistance during ambulation, SV for safety. Took 2x standing rest breaks d/t audible wheezing, however when checked SPO2 92%>. No c/o when asked. Pt requested to sit up in chair at [facility]. Pt left in chair with all needs met, call bell in reach.   Lauraine Erm Mobility Specialist Please contact via SecureChat or [employer] [phone]

## 2024-10-27 NOTE — Anesthesia Procedure Notes (Signed)
 Procedure Name: Intubation Date/Time: 10/27/2024 9:06 AM  Performed by: Elby Raelene SAUNDERS, CRNAPre-anesthesia Checklist: Patient identified, Emergency Drugs available, Suction available and Patient being monitored Patient Re-evaluated:Patient Re-evaluated prior to induction Oxygen  Delivery Method: Circle System Utilized Preoxygenation: Pre-oxygenation with 100% oxygen  Induction Type: IV induction, Rapid sequence and Cricoid Pressure applied Laryngoscope Size: Beem and 2 Grade View: Grade II Tube type: Oral Tube size: 7.5 mm Number of attempts: 1 Airway Equipment and Method: Stylet and Oral airway Placement Confirmation: ETT inserted through vocal cords under direct vision, positive ETCO2 and breath sounds checked- equal and bilateral Secured at: 23 cm Tube secured with: Tape Dental Injury: Teeth and Oropharynx as per pre-operative assessment

## 2024-10-27 NOTE — Anesthesia Preprocedure Evaluation (Addendum)
 "                                  Anesthesia Evaluation  Patient identified by MRN, date of birth, ID band Patient awake    Reviewed: Allergy & Precautions, NPO status , Patient's Chart, lab work & pertinent test results  History of Anesthesia Complications Negative for: history of anesthetic complications  Airway Mallampati: II  TM Distance: >3 FB Neck ROM: Full    Dental  (+) Edentulous Upper, Edentulous Lower   Pulmonary asthma , COPD,  COPD inhaler, neg recent URI, former smoker   Pulmonary exam normal breath sounds clear to auscultation       Cardiovascular hypertension, + CAD, + Cardiac Stents and + Peripheral Vascular Disease  + Valvular Problems/Murmurs (Mod MS) AS  Rhythm:Regular Rate:Normal + Systolic murmurs Echo 10/26/2024  1. Left ventricular ejection fraction, by estimation, is 60 to 65%. The left  ventricle has normal function. The left ventricle has no regional wall motion abnormalities. There is moderate asymmetric left ventricular hypertrophy of  the basal and septal segments. Left ventricular diastolic parameters are  indeterminate.   2. Right ventricular systolic function is normal. The right ventricular size is  normal.   3. Left atrial size was mildly dilated.   4. The mitral valve is degenerative. Trivial mitral valve regurgitation.  Mild mitral stenosis. Severe mitral annular calcification.   5. Compared to prior echo 11/29/23 AS a bit worse mean graident 12-> 21  mmHg peak 22.1 -> 36.7 mmHg AVA 2.49-> 1.8 cm2 and DVI 0.38-> 0.37 .  The aortic valve is tricuspid. There is moderate calcification of the aortic  valve. There is moderate thickening of the aortic valve. Aortic valve  regurgitation is not visualized. Moderate aortic valve stenosis.   6. The inferior vena cava is normal in size with greater than 50%  respiratory variability, suggesting right atrial pressure of 3 mmHg.   7. Agitated saline contrast bubble study was negative, with  no evidence  of any interatrial shunt.    Stress 03/2024   A pharmacological stress test was performed using IV Lexiscan  0.4mg  over 10 seconds performed without concurrent submaximal exercise. The patient reported dyspnea and chest pain during the stress test. Normal blood pressure and normal heart rate response noted during stress. Heart rate recovery was normal.   No ST deviation was noted. ECG was interpretable and without significant changes. The ECG was not diagnostic due to pharmacologic protocol.   LV perfusion is normal. There is no evidence of ischemia. There is no evidence of infarction.   Left ventricular function is normal. Nuclear stress EF: 58%. The left ventricular ejection fraction is normal (55-65%). End diastolic cavity size is mildly enlarged. End systolic cavity size is normal. No evidence of transient ischemic dilation (TID) noted.   CT images were obtained for attenuation correction and were examined for the presence of coronary calcium  when appropriate.   Coronary calcium  was present on the attenuation correction CT images. Moderate coronary calcifications were present. Coronary calcifications were present in the left anterior descending artery and right coronary artery distribution(s).   The study is normal. The study is low risk   TTE (11/2023):   1. Left ventricular ejection fraction, by estimation, is 60 to 65%. The left  ventricle has normal function. The left ventricle has no regional wall motion abnormalities. There is mild asymmetric left ventricular hypertrophy of the  basal-septal  segment. Left ventricular diastolic parameters are consistent  with Grade I diastolic dysfunction (impaired relaxation).   2. Right ventricular systolic function is normal. The right ventricular size is  normal. Tricuspid regurgitation signal is inadequate for assessing PA pressure.   3. The mitral valve is abnormal, possibly rheumatic. Trivial mitral valve  regurgitation.  Moderate mitral stenosis. The mean mitral valve gradient is  7.0 mmHg. Moderate to severe mitral annular calcification.   4. The aortic valve is tricuspid. There is moderate calcification of the  aortic valve. Aortic valve regurgitation is not visualized. Mild aortic  valve stenosis. Aortic valve area, by VTI measures 1.89 cm. Aortic valve  mean gradient measures 12.0 mmHg.   5. The inferior vena cava is dilated in size with <50% respiratory  variability, suggesting right atrial pressure of 15 mmHg.     Neuro/Psych  PSYCHIATRIC DISORDERS Anxiety Depression       GI/Hepatic ,neg GERD  ,,(+)     substance abuse (30+ years ago)  cocaine use  Endo/Other  diabetes, Type 2    Renal/GU      Musculoskeletal  (+) Arthritis ,    Abdominal   Peds  Hematology  (+) Blood dyscrasia, anemia   Anesthesia Other Findings   Reproductive/Obstetrics                              Anesthesia Physical Anesthesia Plan  ASA: 4  Anesthesia Plan: MAC   Post-op Pain Management: Minimal or no pain anticipated   Induction: Intravenous  PONV Risk Score and Plan: 2 and Ondansetron , Treatment may vary due to age or medical condition, Propofol  infusion and TIVA  Airway Management Planned:   Additional Equipment: None  Intra-op Plan:   Post-operative Plan:   Informed Consent: I have reviewed the patients History and Physical, chart, labs and discussed the procedure including the risks, benefits and alternatives for the proposed anesthesia with the patient or authorized representative who has indicated his/her understanding and acceptance.     Dental advisory given  Plan Discussed with: CRNA  Anesthesia Plan Comments: (Risks of anesthesia explained at length. This includes, but is not limited to, sore throat, damage to teeth, lips gums, tongue and vocal cords, nausea and vomiting, reactions to medications, stroke, heart attack, and death. All patient questions  were answered and the patient wishes to proceed. )         Anesthesia Quick Evaluation  "

## 2024-10-28 ENCOUNTER — Other Ambulatory Visit (HOSPITAL_COMMUNITY): Payer: Self-pay

## 2024-10-28 DIAGNOSIS — R7989 Other specified abnormal findings of blood chemistry: Secondary | ICD-10-CM | POA: Diagnosis not present

## 2024-10-28 DIAGNOSIS — J441 Chronic obstructive pulmonary disease with (acute) exacerbation: Secondary | ICD-10-CM | POA: Diagnosis not present

## 2024-10-28 DIAGNOSIS — E875 Hyperkalemia: Secondary | ICD-10-CM | POA: Diagnosis not present

## 2024-10-28 DIAGNOSIS — R0789 Other chest pain: Secondary | ICD-10-CM | POA: Diagnosis not present

## 2024-10-28 LAB — CBC WITH DIFFERENTIAL/PLATELET
Abs Immature Granulocytes: 0.1 K/uL — ABNORMAL HIGH (ref 0.00–0.07)
Basophils Absolute: 0 K/uL (ref 0.0–0.1)
Basophils Relative: 0 %
Eosinophils Absolute: 0.2 K/uL (ref 0.0–0.5)
Eosinophils Relative: 2 %
HCT: 27.1 % — ABNORMAL LOW (ref 39.0–52.0)
Hemoglobin: 8.5 g/dL — ABNORMAL LOW (ref 13.0–17.0)
Immature Granulocytes: 1 %
Lymphocytes Relative: 15 %
Lymphs Abs: 1.2 K/uL (ref 0.7–4.0)
MCH: 26.7 pg (ref 26.0–34.0)
MCHC: 31.4 g/dL (ref 30.0–36.0)
MCV: 85.2 fL (ref 80.0–100.0)
Monocytes Absolute: 0.8 K/uL (ref 0.1–1.0)
Monocytes Relative: 10 %
Neutro Abs: 5.7 K/uL (ref 1.7–7.7)
Neutrophils Relative %: 72 %
Platelets: 248 K/uL (ref 150–400)
RBC: 3.18 MIL/uL — ABNORMAL LOW (ref 4.22–5.81)
RDW: 15.7 % — ABNORMAL HIGH (ref 11.5–15.5)
WBC: 8 K/uL (ref 4.0–10.5)
nRBC: 0 % (ref 0.0–0.2)

## 2024-10-28 LAB — BASIC METABOLIC PANEL WITH GFR
Anion gap: 11 (ref 5–15)
BUN: 18 mg/dL (ref 8–23)
CO2: 27 mmol/L (ref 22–32)
Calcium: 8.6 mg/dL — ABNORMAL LOW (ref 8.9–10.3)
Chloride: 100 mmol/L (ref 98–111)
Creatinine, Ser: 0.85 mg/dL (ref 0.61–1.24)
GFR, Estimated: >60 mL/min
Glucose, Bld: 161 mg/dL — ABNORMAL HIGH (ref 70–99)
Potassium: 4.1 mmol/L (ref 3.5–5.1)
Sodium: 137 mmol/L (ref 135–145)

## 2024-10-28 LAB — GLUCOSE, CAPILLARY
Glucose-Capillary: 189 mg/dL — ABNORMAL HIGH (ref 70–99)
Glucose-Capillary: 251 mg/dL — ABNORMAL HIGH (ref 70–99)
Glucose-Capillary: 140 mg/dL — ABNORMAL HIGH (ref 70–99)

## 2024-10-28 MED ORDER — TRULICITY 4.5 MG/0.5ML ~~LOC~~ SOAJ
4.5000 mg | SUBCUTANEOUS | 0 refills | Status: AC
  Filled 2024-10-28: qty 2, 28d supply, fill #0

## 2024-10-28 MED ORDER — CLOPIDOGREL BISULFATE 75 MG PO TABS
75.0000 mg | ORAL_TABLET | Freq: Every day | ORAL | Status: DC
  Administered 2024-10-28: 75 mg via ORAL
  Filled 2024-10-28: qty 1

## 2024-10-28 MED ORDER — INSULIN DEGLUDEC 100 UNIT/ML ~~LOC~~ SOPN
24.0000 [IU] | PEN_INJECTOR | Freq: Every day | SUBCUTANEOUS | 4 refills | Status: AC
  Filled 2024-10-28: qty 15, 62d supply, fill #0

## 2024-10-28 MED ORDER — ALBUTEROL SULFATE HFA 108 (90 BASE) MCG/ACT IN AERS
1.0000 | INHALATION_SPRAY | RESPIRATORY_TRACT | 5 refills | Status: AC | PRN
  Filled 2024-10-28 – 2024-11-12 (×2): qty 6.7, 14d supply, fill #0
  Filled 2024-11-13: qty 6.7, 17d supply, fill #0

## 2024-10-28 MED ORDER — LINACLOTIDE 290 MCG PO CAPS
290.0000 ug | ORAL_CAPSULE | Freq: Every day | ORAL | 0 refills | Status: AC
  Filled 2024-10-28: qty 30, 30d supply, fill #0

## 2024-10-28 MED ORDER — PANTOPRAZOLE SODIUM 40 MG PO TBEC
40.0000 mg | DELAYED_RELEASE_TABLET | Freq: Every day | ORAL | 0 refills | Status: AC
  Filled 2024-10-28: qty 30, 30d supply, fill #0

## 2024-10-28 NOTE — Care Management Important Message (Signed)
 Important Message  Patient Details  Name: Chazz Philson MRN: [id number] Date of Birth: [birth year]   Important Message Given:  Yes - Medicare IM     Vonzell Arrie Sharps 10/28/2024, 8:31 AM

## 2024-10-28 NOTE — Progress Notes (Signed)
 "   Inpatient Progress Note     Patient Profile/Chief Complaint  64 year old male with history of aortic stenosis, CAD on Plavix , recent knee replacement on aspirin , IDA, gastric and duodenal angioectasias admitted with asthma exacerbation.  Patient demonstrated anemia and decline in hemoglobin this admission in the setting of Lovenox  and Plavix  without active bleeding.  EGD 09/2024 with gastric and duodenal angioectasias status post clip placement on duodenal angioectasias  Lovenox  and Plavix  on hold  Hemoccult negative this hospitalization  VCE completed 10/25/2024 -1 nonbleeding angioectasia in the duodenal bulb, 1 mucosal break versus diminutive angioectasia in the proximal duodenum.  Otherwise normal video capsule endoscopy complete to the cecum  SBE -retained food in stomach, single angioectasia in duodenal bulb adjacent to previously placed clips treated with APC   Interval History   -- Clinically stable overnight without any acute events -- Hemoglobin stable this morning 8.5    Objective   Vital signs in last 24 hours: Temp:  [97.7 F (36.5 C)-98.5 F (36.9 C)] 98.1 F (36.7 C) (01/21 0300) Pulse Rate:  [75-97] 80 (01/21 0809) Resp:  [11-25] 19 (01/21 0809) BP: (100-132)/(55-80) 124/65 (01/21 0300) SpO2:  [97 %-100 %] 100 % (01/21 0809) Weight:  [99.9 kg] 99.9 kg (01/21 0531) Last BM Date : 10/26/24 General:    Alert, resting in bed in no distress, speaking in full sentences Heart:  Regular rate and rhythm; no murmurs Lungs: respirations even and unlabored, lungs CTA bilaterally,  Abdomen:  Soft, nontender and nondistended. Normal bowel sounds. Extremities:  Without edema. Neurologic:  Alert and oriented,  grossly normal neurologically. Psych:  Cooperative. Normal mood and affect.  Intake/Output from previous day: 01/20 0701 - 01/21 0700 In: 300 [I.V.:300] Out: -  Intake/Output this shift: No intake/output data recorded.  Lab Results: Recent Labs     10/26/24 0814 10/27/24 0321 10/28/24 0340  WBC 8.8  --  8.0  HGB 9.0* 8.4* 8.5*  HCT 28.2* 26.4* 27.1*  PLT 252  --  248   BMET Recent Labs    10/26/24 0814 10/28/24 0340  NA 137 137  K 4.0 4.1  CL 102 100  CO2 25 27  GLUCOSE 153* 161*  BUN 20 18  CREATININE 0.88 0.85  CALCIUM  8.8* 8.6*   LFT No results for input(s): PROT, ALBUMIN, AST, ALT, ALKPHOS, BILITOT, BILIDIR, IBILI in the last 72 hours.  PT/INR No results for input(s): LABPROT, INR in the last 72 hours.  Studies/Results: ECHOCARDIOGRAM COMPLETE BUBBLE STUDY Result Date: 10/26/2024    ECHOCARDIOGRAM REPORT   Patient Name:   SHERIDAN GETTEL Date of Exam: 10/26/2024 Medical Rec #:  [id number]       Height:       75.0 in Accession #:    [id number]      Weight:       227.1 lb Date of Birth:  [birth year]        BSA:          2.316 m Patient Age:    63 years        BP:           99/81 mmHg Patient Gender: M               HR:           85 bpm. Exam Location:  Inpatient Procedure: 2D Echo, Cardiac Doppler, Color Doppler and Saline Contrast Bubble            Study (Both Spectral and Color Flow  Doppler were utilized during            procedure). Indications:    Aortic stenosis  History:        Patient has prior history of Echocardiogram examinations, most                 recent 11/29/2023. CAD, COPD; Risk Factors:Hypertension and                 Diabetes.  Sonographer:    Meagan Baucom RDCS, FE, PE Referring Phys: [id number] JULIE ANNE WILLIAMS IMPRESSIONS  1. Left ventricular ejection fraction, by estimation, is 60 to 65%. The left ventricle has normal function. The left ventricle has no regional wall motion abnormalities. There is moderate asymmetric left ventricular hypertrophy of the basal and septal segments. Left ventricular diastolic parameters are indeterminate.  2. Right ventricular systolic function is normal. The right ventricular size is normal.  3. Left atrial size was mildly dilated.  4. The mitral valve is  degenerative. Trivial mitral valve regurgitation. Mild mitral stenosis. Severe mitral annular calcification.  5. Compared to prior echo 11/29/23 AS a bit worse mean graident 12-> 21 mmHg peak 22.1 -> 36.7 mmHg AVA 2.49-> 1.8 cm2 and DVI 0.38-> 0.37 . The aortic valve is tricuspid. There is moderate calcification of the aortic valve. There is moderate thickening of the aortic valve. Aortic valve regurgitation is not visualized. Moderate aortic valve stenosis.  6. The inferior vena cava is normal in size with greater than 50% respiratory variability, suggesting right atrial pressure of 3 mmHg.  7. Agitated saline contrast bubble study was negative, with no evidence of any interatrial shunt. FINDINGS  Left Ventricle: Left ventricular ejection fraction, by estimation, is 60 to 65%. The left ventricle has normal function. The left ventricle has no regional wall motion abnormalities. Strain was performed and the global longitudinal strain is indeterminate. The left ventricular internal cavity size was normal in size. There is moderate asymmetric left ventricular hypertrophy of the basal and septal segments. Left ventricular diastolic parameters are indeterminate. Right Ventricle: The right ventricular size is normal. No increase in right ventricular wall thickness. Right ventricular systolic function is normal. Left Atrium: Left atrial size was mildly dilated. Right Atrium: Right atrial size was normal in size. Pericardium: There is no evidence of pericardial effusion. Mitral Valve: The mitral valve is degenerative in appearance. There is moderate thickening of the mitral valve leaflet(s). There is moderate calcification of the mitral valve leaflet(s). Severe mitral annular calcification. Trivial mitral valve regurgitation. Mild mitral valve stenosis. MV peak gradient, 10.4 mmHg. The mean mitral valve gradient is 6.0 mmHg. Tricuspid Valve: The tricuspid valve is normal in structure. Tricuspid valve regurgitation is mild .  No evidence of tricuspid stenosis. Aortic Valve: Compared to prior echo 11/29/23 AS a bit worse mean graident 12-> 21 mmHg peak 22.1 -> 36.7 mmHg AVA 2.49-> 1.8 cm2 and DVI 0.38-> 0.37. The aortic valve is tricuspid. There is moderate calcification of the aortic valve. There is moderate thickening of the aortic valve. Aortic valve regurgitation is not visualized. Moderate aortic stenosis is present. Aortic valve mean gradient measures 21.0 mmHg. Aortic valve peak gradient measures 36.7 mmHg. Aortic valve area, by VTI measures 1.95 cm. Pulmonic Valve: The pulmonic valve was normal in structure. Pulmonic valve regurgitation is trivial. No evidence of pulmonic stenosis. Aorta: The aortic root is normal in size and structure. Venous: The inferior vena cava is normal in size with greater than 50% respiratory variability, suggesting right atrial  pressure of 3 mmHg. IAS/Shunts: No atrial level shunt detected by color flow Doppler. Agitated saline contrast was given intravenously to evaluate for intracardiac shunting. Agitated saline contrast bubble study was negative, with no evidence of any interatrial shunt. Additional Comments: 3D was performed not requiring image post processing on an independent workstation and was indeterminate.  LEFT VENTRICLE PLAX 2D LVIDd:         4.95 cm   Diastology LVIDs:         3.35 cm   LV e' medial:    5.87 cm/s LV PW:         1.20 cm   LV E/e' medial:  23.7 LV IVS:        1.25 cm   LV e' lateral:   5.55 cm/s LVOT diam:     2.60 cm   LV E/e' lateral: 25.0 LV SV:         111 LV SV Index:   48 LVOT Area:     5.31 cm  RIGHT VENTRICLE RV S prime:     12.50 cm/s TAPSE (M-mode): 2.0 cm LEFT ATRIUM              Index        RIGHT ATRIUM           Index LA diam:        3.70 cm  1.60 cm/m   RA Area:     17.40 cm LA Vol (A2C):   105.0 ml 45.34 ml/m  RA Volume:   45.10 ml  19.47 ml/m LA Vol (A4C):   71.6 ml  30.91 ml/m LA Biplane Vol: 86.4 ml  37.31 ml/m  AORTIC VALVE AV Area (Vmax):    1.80  cm AV Area (Vmean):   1.74 cm AV Area (VTI):     1.95 cm AV Vmax:           303.00 cm/s AV Vmean:          213.333 cm/s AV VTI:            0.570 m AV Peak Grad:      36.7 mmHg AV Mean Grad:      21.0 mmHg LVOT Vmax:         103.00 cm/s LVOT Vmean:        70.100 cm/s LVOT VTI:          0.209 m LVOT/AV VTI ratio: 0.37  AORTA Ao Root diam: 3.50 cm Ao Asc diam:  3.50 cm MITRAL VALVE                TRICUSPID VALVE MV Area (PHT): 3.03 cm     TR Peak grad:   12.2 mmHg MV Area VTI:   2.30 cm     TR Vmax:        175.00 cm/s MV Peak grad:  10.4 mmHg MV Mean grad:  6.0 mmHg     SHUNTS MV Vmax:       1.62 m/s     Systemic VTI:  0.21 m MV Vmean:      116.5 cm/s   Systemic Diam: 2.60 cm MV Decel Time: 250 msec MV E velocity: 139.00 cm/s MV A velocity: 160.00 cm/s MV E/A ratio:  0.87 Maude Emmer MD Electronically signed by Maude Emmer MD Signature Date/Time: 10/26/2024/9:52:08 AM    Final     Endoscopic Studies: VCE completed 10/25/2024 -1 nonbleeding angioectasia in the duodenal bulb, 1 mucosal break versus diminutive angioectasia in the proximal  duodenum.  Otherwise normal video capsule endoscopy complete to the cecum   Clinical Impression   64 year old male with history of aortic stenosis, CAD on Plavix , recent knee replacement on aspirin , IDA, gastric and duodenal angioectasias admitted with asthma exacerbation.  Patient demonstrated anemia and decline in hemoglobin this admission in the setting of Lovenox  and Plavix  without active bleeding.  EGD 09/2024 with gastric and duodenal angioectasias status post clip placement on duodenal angioectasias  Lovenox  and Plavix  on hold  Hemoccult negative this hospitalization  VCE completed 10/25/2024 -1 nonbleeding angioectasia in the duodenal bulb, 1 mucosal break versus diminutive angioectasia in the proximal duodenum.  Otherwise normal video capsule endoscopy complete to the cecum  SBE -retained food in stomach, single angioectasia in duodenal bulb adjacent to  previously placed clips treated with APC  Mr. Lizarraga is clinically stable after SBE yesterday with treatment of duodenal bulb angioectasia.  Angioectasias seen in the stomach on recent EGD were not visualized yesterday due to retained food in the stomach.  Hemoglobin is stable this morning at 8.5.  Primary team reports that the indication for Plavix  is PAD and therefore will be resumed.  They will start oral iron  and patient iron  infusions can be managed through primary care per my discussion with primary team at bedside today.  With regard to constipation, Mr. Pember reported lack of efficacy with over-the-counter agents.  Given retained food in his stomach at the time of small bowel enteroscopy he may have a more global gastrointestinal dysmotility condition.  He was given a trial of Linzess  in the hospital and will continue this outpatient if it is covered by insurance.   Plan  Stable for discharge from a GI perspective Per discussion with primary team agree with resumption of Plavix  for hx of PAD Discharge on Protonix  40 mg orally daily Agree with iron  supplementation and management of iron  infusions through primary care Discharge on Linzess  290 mcg orally daily Follow-up with Dr. Shila or APP in the GI office in 4 to 6 weeks for follow-up of IDA, history of gastrointestinal angioectasias, constipation     LOS: 6 days   Richard [name] [name]  10/28/2024, 8:41 AM  Richard Hausen, MD [facility]  "

## 2024-10-28 NOTE — Progress Notes (Signed)
 Mobility Specialist Progress Note;   10/28/24 0933  Mobility  Activity Ambulated with assistance  Level of Assistance Standby assist, set-up cues, supervision of patient - no hands on  Assistive Device Front wheel walker  Distance Ambulated (ft) 150 ft  Activity Response Tolerated well  Mobility Referral Yes  Mobility visit 1 Mobility  Mobility Specialist Start Time (ACUTE ONLY) 0933  Mobility Specialist Stop Time (ACUTE ONLY) 0943  Mobility Specialist Time Calculation (min) (ACUTE ONLY) 10 min   Pt sitting on EoB upon arrival, eager for mobility. Required no physical assistance during ambulation, SV for safety. VSS throughout. Took 1x standing rest break this session. Pt states feeling much stronger today than yesterday. No c/o when asked. Pt returned back to sitting on EoB and left with all needs met.   Lauraine Erm Mobility Specialist Please contact via SecureChat or [employer] [phone]

## 2024-10-28 NOTE — Progress Notes (Signed)
 Pharmacist and student pharmacists rounding with the Internal Medicine Teaching Service/B2-Lane Service, Dr. Trudy, Attending Physician. Discussion at bedside, patient expressed frustration for multiple medications and in some instances not knowing why he was taking specific medications. To address this, the Attending Physician and Intern have reviewed his medication lists in an effort to reduce poly-pharmacy/duplications, etc. Additionally, Pharmacists have added medication detains to his discharge medication instructions that include reason(s) why he is taking each medication.   Lynwood Lites, PharmD, CPP Clinical Pharmacist Practitioner  Norlene Hugh, P4 Student Pharmacist Jonette Coad, P4 Student Pharmacist

## 2024-10-28 NOTE — Discharge Summary (Signed)
 "  Name: Richard [name] MRN: [id number] DOB: November 01, 1960 64 y.o. PCP: Richard Dayton [name], DO  Date of Admission: 10/22/2024  9:07 AM Date of Discharge: 10/28/2024 Attending Physician: Dr. Mliss [name]  Discharge Diagnosis: 1. Principal Problem:   Acute on chronic blood loss anemia Active Problems:   Asthma-COPD overlap syndrome (HCC)   Anterior chest wall pain   Constipation   Aortic stenosis, moderate   Acute exacerbation of COPD with asthma (HCC)   DM (diabetes mellitus), type 2 (HCC)   Angiectasia of gastrointestinal tract  Discharge Medications: Allergies as of 10/28/2024       Reactions   Shellfish Allergy Anaphylaxis, Shortness Of Breath        Medication List     STOP taking these medications    ascorbic acid  500 MG tablet Commonly known as: VITAMIN C    clobetasol  cream 0.05 % Commonly known as: TEMOVATE    clonazePAM  0.5 MG tablet Commonly known as: KlonoPIN    escitalopram  5 MG tablet Commonly known as: Lexapro    meloxicam  15 MG tablet Commonly known as: MOBIC    multivitamin tablet   naproxen  500 MG EC tablet Commonly known as: EC NAPROSYN        TAKE these medications    Accu-Chek Softclix Lancets lancets 1 each by Other route See admin instructions. 1-2 times daily Notes to patient: To check your capillary blood glucose to aid in managing your diabetes.   albuterol  (2.5 MG/3ML) 0.083% nebulizer solution Commonly known as: PROVENTIL  Take 2.5 mg by nebulization in the morning, at noon, and at bedtime. What changed: Another medication with the same name was changed. Make sure you understand how and when to take each.   albuterol  108 (90 Base) MCG/ACT inhaler Commonly known as: VENTOLIN  HFA Inhale 1-2 puffs into the lungs every 4 (four) hours as needed for wheezing or shortness of breath. What changed:  how much to take when to take this   atorvastatin  40 MG tablet Commonly known as: LIPITOR Take 1 tablet (40 mg total) by mouth daily. What  changed: when to take this   Breztri  Aerosphere 160-9-4.8 MCG/ACT Aero inhaler Generic drug: budesonide -glycopyrrolate -formoterol  Inhale 2 puffs into the lungs in the morning and at bedtime. Notes to patient: For treatment of COPD and to improve breathing and reduce flare-ups of COPD.   cetirizine  10 MG tablet Commonly known as: ZyrTEC  Allergy Take 1 tablet (10 mg total) by mouth daily. What changed:  when to take this reasons to take this   clopidogrel  75 MG tablet Commonly known as: PLAVIX  Take 1 tablet (75 mg total) by mouth daily. What changed: when to take this   diphenhydrAMINE  25 mg capsule Commonly known as: Benadryl  Allergy Take 1 capsule (25 mg total) by mouth at bedtime as needed for allergies or itching.   Dupixent 300 MG/2ML Soaj Generic drug: Dupilumab Inject 300 mg into the skin every 14 (fourteen) days. Notes to patient: To reduce inflammation associated eczema (a condition of the skin causing itching.) Also helps reduce COPD episodes.    Embecta Pen Needle Nano 32G X 4 MM Misc Generic drug: Insulin  Pen Needle Use 1 each daily as directed.   fluticasone  50 MCG/ACT nasal spray Commonly known as: FLONASE  Place 1 spray into both nostrils as needed for allergies. Notes to patient: To reduce nasal allergies.    FreeStyle Libre 3 Plus Sensor Misc Place 1 sensor on the skin every 15 days. Use to check glucose continuously.   glucose blood test strip 1 each by Other  route in the morning and at bedtime.   insulin  degludec 100 UNIT/ML FlexTouch Pen Commonly known as: TRESIBA  Inject 24 Units into the skin daily. What changed: how much to take   linaclotide  290 MCG Caps capsule Commonly known as: LINZESS  Take 1 capsule (290 mcg total) by mouth daily before breakfast.   montelukast  10 MG tablet Commonly known as: SINGULAIR  Take 1 tablet (10 mg total) by mouth at bedtime. Notes to patient: To reduce symptoms of both asthma and COPD.   pantoprazole  40 MG  tablet Commonly known as: PROTONIX  Take 1 tablet (40 mg total) by mouth daily.   pioglitazone  30 MG tablet Commonly known as: ACTOS  Take 30 mg by mouth daily. Notes to patient: Used in conjunction with diet and exercise to improve glycemic control in adults with type 2 diabetes.   pregabalin  100 MG capsule Commonly known as: LYRICA  Take 1 capsule (100 mg total) by mouth 3 (three) times daily. Notes to patient: For reducing pain associated with diabetic peripheral neuropathy.   Synjardy  12.02-999 MG Tabs Generic drug: Empagliflozin -metFORMIN  HCl Take 1 tablet by mouth 2 (two) times daily. Notes to patient: Used in combination with diet and exercise to lower your blood sugar, protect your heart and kidneys.    tadalafil  10 MG tablet Commonly known as: Cialis  Take 1 tablet (10 mg total) by mouth as needed for erectile dysfunction. WAIT THREE DAYS FROM SURGERY BEFORE RESTARTING Notes to patient: To help achieve and maintain erection.   timolol  0.5 % ophthalmic solution Commonly known as: TIMOPTIC  Place 1 drop into both eyes 2 (two) times daily. Notes to patient: For reducing pressure within your eyes.   triamcinolone  cream 0.1 % Commonly known as: KENALOG  Apply 1 Application topically 2 (two) times daily. Use for 2 weeks on, 2 weeks off. Repeat PRN. Notes to patient: For itching of your skin.    Trulicity  4.5 MG/0.5ML Soaj Generic drug: Dulaglutide  Inject 4.5 mg as directed once a week on Fridays -- WAIT 2 WEEKS AFTER SURGERY BEFORE RESTARTING What changed: additional instructions        Disposition and follow-up:   [name] was discharged from [facility] in Good condition.  At the hospital follow up visit please address:  1.  Echo this hospitalization - worsening of moderate aortic stenosis, ensure cardiology follow-up  2. There was also a moderate amount of food in his stomach seen on enteroscopy, concerning for possible  gastroparesis/gastrointestinal motility that should be explored further  3. Schedule new patient appointment with Richard [name], [facility] pharmacist for medication reconciliation  Thank you for allowing us  to be part of your care. You were hospitalized for COPD exacerbation. We treated you with nebulizers, inhalers, antibiotics and steroids.  See the changes in your medications and management of your chronic conditions below:  *For your constipation -We have STARTED you on these following medications:  - Linzess  290 mcg daily for constipation   You can also take MiraLAX  twice daily as needed  *For your possible GI bleed, now resolving -We have STARTED you on these following medications:  - Pantoprazole  40mg  daily  FOLLOW UP APPOINTMENTS: Please visit your PCP on 11/16/24 at 8:45am for a hospital follow-up  Please visit your gastroenterologist on 11/18/24 at 1:30pm   Please visit your pulmonologist on 11/20/24 at 10:00am  Please call your PCP or our clinic if you have any questions or concerns, we may be able to help and keep you from a long and expensive emergency room wait.  Our clinic and after hours phone number is [phone]. The best time to call is Monday through Friday 9 am to 4 pm but there is always someone available 24/7 if you have an emergency. If you need medication refills please notify your pharmacy one week in advance and they will send us  a request.   We are glad you are feeling better,  Alfornia Light, DO Internal Medicine Inpatient Teaching Service at [facility]   Follow-up Appointments:  Follow-up Information     Wonda Sharper, MD. Schedule an appointment as soon as possible for a visit in 1 month(s).   Specialty: Cardiology Why: follow-up echo results from this hospitilization; progression of moderate aortic stenosis Contact information: [address] [place] [address] [phone]         Richard Dayton [name], DO. Go on 11/16/2024.   Specialty: Internal  Medicine Why: 8:45am Contact information: [address]  [place] [address] [phone]         Shila Gustav GAILS, MD. Go on 11/18/2024.   Specialty: Gastroenterology Why: 1:30pm Contact information: [address] [place] [address] [phone]         Neda Jennet LABOR, MD. Krista on 11/20/2024.   Specialty: Pulmonary Disease Why: 10:00am Contact information: [address] [place] [address] [phone]                Hospital Course by problem list: Reeve Turnley is a 64 y.o. person living with a history of COPD, asthma, CAD, moderate AS/MS, HTN, and T2DM w/peripheral neuropathy who presented to the [facility] ED with dyspnea and cough and subsequently admitted for asthma/COPD exacerbation, he was also found to have acute on chronic anemia, now being discharged on hospital day 6 with the following pertinent hospital course:  #Asthma-COPD overlap syndrome #Acute exacerbation of COPD with asthma #Asthma exacerbation Patient presented with a week long history of worsening productive cough, pleuritic chest pain, dyspnea, fatigue, and dizziness in the setting of restarting his COPD and asthma medications. On initial exam, patient with increased work of breathing, decreased breath sounds and wheezing bilaterally. Patient endorses symptomatic improvement on albuterol  treatments. CXR showed no acute pathology or consolidation and CT angiogram showed no evidence of pulmonary embolism. Etiology of patient's breathing difficulties is thought to be asthma/COPD exacerbation in the setting of URI and recent medication pause. Patient was started on home Duoneb, montelukast  and started on 5 day steroid dose pack. Patient required additional respiratory support so home Breztri  and Pulmicort  also restarted. Patient had great improvement in his respiratory status upon completion of his steroid dose pack, and felt his breathing returned to baseline by  discharge.  # Acute on chronic normocytic anemia Patient reported few weeks long history of dizziness, and endorses feeling like the room is spinning in the ER.  Patient denied weakness, hematochezia or melena.  Patient's hemoglobin on admission is 9.2, and recheck of 8.2. Patient received 1 L IVF, hemoglobin dropped to 6.9 without improvement in BUN. Patient was given total of 2 units PRBC with H&H stable at 8.3&25. Initial concern now for UGI bleed given lab values did not improve with fluid bolus. Rectal exam performed to rule out overt GI bleed with empty vault and negative occult stool. GI consulted, who ordered CT A/P to rule out retroperitoneal bleed, no acute concern and some constipation noted. Capsule endoscopy was performed showing 1 nonbleeding angioectasia in the duodenal bulb, 1 mucosal break vs diminutive angioectasia in proximal duodenum. GI  recommended small bowel enteroscopy which showed a single non-bleeding angiectasia in the duodenum that was treated with argon plasma coagulation. Patient's H&H was rechecked with his hemoglobin stable at 8.5 and hematocrit 27.1, and BUN 18 without concern for ongoing acute bleed.  # Constipation Patient had last BM reported on 1/16, received   3 doses of MiraLAX , 1 dose of senna, still with no bowel movement.  Gastroenterology started patient on Linzess  for constipation management, and patient reported a dark, black, runny bowel movement before his procedure. Patient says this is a longstanding, chronic issue, and he hasn't found relief with over the counter supplements. We are sending patient home with prescription for Linzess  and provided more education on the management of his constipation.   # Chest wall Pain Patient originally presenting endorsing chest wall pain, pleuritic in nature given his associated cough and similar symptomology from previous hospitalizations. He had nonconcerning EKG and troponin, ACS workup in ED was negative and low  concern for acute event. We started patient on mucinex  DM as needed and scheduled tylenol . Patient still had some chest wall pain when coughing, so we started scheduled Tussinex. Patient reported improvement in his chest pain, though complaining of poor mucus expectoration, so adjusted the Tussionex to as needed dosing to encourage better cough for sputum expectoration, which resolved this issue,  # T2DM # Diabetic neuropathy Patient has T2DM with a recent A1C of 7.6 in Nov 2025, currently on home dose of 20 units long acting insulin , trulicity , synjardy , and actos . Patient reports taking most of these medications outside of trulicity  after recent surgery. Patient also has diabetic neuropathy managed with Lyrica , restarted in hospital. There are no acute concerns for his diabetes, and patient started on SSI and 20 units of Semglee  daily. Titrated patient to resistant-SSI and 25 units of long-acting insulin  with much improvement in his CBGs.    # Osteoarthritis of right knee Patient has recent history of total right knee arthoplasty approximately 6 weeks ago. Patient currently not experiencing any symptoms but has been on NSAIDs for pain. NSAIDs were held in the setting of his acute anemia, though patient receiving scheduled tylenol  for pain and inflammation.  # HTN # CAD # PAD # Moderate AS/MS Patient has a history of CAD and moderate AS/MS. Patient is currently experiencing chest pain with cough only, BMP WNL, declining troponins, and non-concerning EKGs. Etiology of chest pain is likely pleuritic given association with cough. We  restarted patient on appropriate home medications and held Plavix  in the setting of unspecified anemia. Plavix  was restarted at time of discharge given this patients history of PAD.  Subjective Patient reports to be feeling better. He wants to go home but only if his constipation is addressed because he doesn't want to end up back at the hospital. Discussed with patient  the new medication we added for his constipation, then he mentioned he is specifically frustrated with polypharmacy in his chart. He says it's our job to reduce the number of his medications. Discussed that unfortunately sometimes we need to be on several medications, and we will continue to reduce pill burden. We suggested working with his PCP to get an appointment with the [facility] pharmacist who will review ongoing needs for medications, Also discussed with patient we will go over his medications with him before he discharges. Patient says he likes the healing process at the hospital but he hates the food and everything else about this place. No other questions or concerns at this time.  Discharge Exam:   BP 124/65 (BP Location: Right Arm)   Pulse 80   Temp 98.1 F (36.7 C) (Oral)   Resp 19   Ht 6' 3 (1.905 m)   Wt 99.9 kg   SpO2 100%   BMI 27.53 kg/m  Discharge exam:  General: Pleasant, chronically-ill-appearing gentleman Resp: No wheezing on lung exam, improved clarity, able to speak in full sentences without intermittent bouts of cough Cardiac: RRR, crescendo-decrescendo systolic murmur RUSB. Abd: Abdomen soft, mildly distended, normal bowel sounds. Extremities: well-perfused, non edematous, not TTP  Pertinent Labs, Studies, and Procedures:     Latest Ref Rng & Units 10/28/2024    3:40 AM 10/27/2024    3:21 AM 10/26/2024    8:14 AM  CBC  WBC 4.0 - 10.5 K/uL 8.0   8.8   Hemoglobin 13.0 - 17.0 g/dL 8.5  8.4  9.0   Hematocrit 39.0 - 52.0 % 27.1  26.4  28.2   Platelets 150 - 400 K/uL 248   252        Latest Ref Rng & Units 10/28/2024    3:40 AM 10/26/2024    8:14 AM 10/25/2024    6:00 AM  CMP  Glucose 70 - 99 mg/dL 838  846  73   BUN 8 - 23 mg/dL 18  20  31    Creatinine 0.61 - 1.24 mg/dL 9.14  9.11  9.26   Sodium 135 - 145 mmol/L 137  137  140   Potassium 3.5 - 5.1 mmol/L 4.1  4.0  3.9   Chloride 98 - 111 mmol/L 100  102  105   CO2 22 - 32 mmol/L 27  25  24    Calcium  8.9 - 10.3  mg/dL 8.6  8.8  8.6     CT ABDOMEN PELVIS W CONTRAST Result Date: 10/23/2024 EXAM: CT ABDOMEN AND PELVIS WITH CONTRAST 10/23/2024 06:40:52 PM TECHNIQUE: CT of the abdomen and pelvis was performed with the administration of 75 mL of iohexol  (OMNIPAQUE ) 350 MG/ML injection. Multiplanar reformatted images are provided for review. Automated exposure control, iterative reconstruction, and/or weight-based adjustment of the mA/kV was utilized to reduce the radiation dose to as low as reasonably achievable. COMPARISON: 08/22/2022 CLINICAL HISTORY: Anemia (Ped 0-17y); Anemia with 2 g drop in hemoglobin, over past 24 hours, on Lovenox  rule out retroperitoneal hematoma or other intra-abdominal bleed. FINDINGS: LOWER CHEST: Trace pericardial effusion. LIVER: The liver is unremarkable. GALLBLADDER AND BILE DUCTS: Decompressed gallbladder. No radiopaque gallstones. No biliary ductal dilatation. SPLEEN: No acute abnormality. PANCREAS: No acute abnormality. ADRENAL GLANDS: No acute abnormality. KIDNEYS, URETERS AND BLADDER: Small left upper pole renal cyst. No stones in the kidneys or ureters. No hydronephrosis. No perinephric or periureteral stranding. The urinary bladder is partially distended, without focal abnormality. Per consensus, no follow-up is needed for simple Bosniak type 1 and 2 renal cysts, unless the patient has a malignancy history or risk factors. GI AND BOWEL: Heterogeneous material in the stomach, likely ingested material. Moderate amount of hyperdensity, well formed stool throughout the colon. The appendix was not visualized. No pericecal or right lower quadrant inflammatory stranding to suggest acute appendicitis. There is no bowel obstruction. PERITONEUM AND RETROPERITONEUM: No ascites. No free air. No free pelvic fluid. VASCULATURE: Aorta is normal in caliber. Diffuse aortoiliac atherosclerosis. LYMPH NODES: No lymphadenopathy. REPRODUCTIVE ORGANS: No prostatomegaly. No acute abnormality. BONES AND SOFT  TISSUES: Multilevel degenerative disc disease of the spine. Severe osteoarthritis of the left hip. Mild to moderate right hip osteoarthritis. No acute  osseous abnormality. No focal soft tissue abnormality. IMPRESSION: 1. No acute intraabdominal or pelvic abnormality. No retroperitoneal hematoma. 2. Moderate amount of hyperdense, well-formed stool throughout the colon , which may reflect constipation and occult blood products. Fecal hemoccult testing may be of benefit as clinically warranted. Electronically signed by: Rogelia Myers MD 10/23/2024 07:02 PM EST RP Workstation: [id number]   CT Angio Chest PE W and/or Wo Contrast Result Date: 10/22/2024 CLINICAL DATA:  Chest pain and tightness EXAM: CT ANGIOGRAPHY CHEST WITH CONTRAST TECHNIQUE: Multidetector CT imaging of the chest was performed using the standard protocol during bolus administration of intravenous contrast. Multiplanar CT image reconstructions and MIPs were obtained to evaluate the vascular anatomy. RADIATION DOSE REDUCTION: This exam was performed according to the departmental dose-optimization program which includes automated exposure control, adjustment of the mA and/or kV according to patient size and/or use of iterative reconstruction technique. CONTRAST:  75mL OMNIPAQUE  IOHEXOL  350 MG/ML SOLN COMPARISON:  Chest x-ray 10/22/2024, chest CT 02/10/2023 FINDINGS: Cardiovascular: Satisfactory opacification of the pulmonary arteries to the segmental level. No evidence of pulmonary embolism. Normal heart size. Mild aortic atherosclerosis. Coronary vascular calcification. Mild aortic valvular calcification. Mitral calcification. No pericardial effusion Mediastinum/Nodes: Patent trachea. No thyroid  mass. No suspicious lymph nodes. Esophagus within normal limits. Lungs/Pleura: Mild emphysema. No acute airspace disease, pleural effusion or pneumothorax. Upper Abdomen: No acute finding Musculoskeletal: No acute osseous abnormality Review of the MIP images  confirms the above findings. IMPRESSION: 1. Negative for acute pulmonary embolus. 2. Emphysema.  No acute airspace disease 3. Aortic atherosclerosis. Aortic Atherosclerosis (ICD10-I70.0) and Emphysema (ICD10-J43.9). Electronically Signed   By: Luke Bun M.D.   On: 10/22/2024 15:21   DG Chest Port 1 View Result Date: 10/22/2024 EXAM: 1 VIEW(S) XRAY OF THE CHEST 10/22/2024 09:21:00 AM COMPARISON: 06/23/2024 CLINICAL HISTORY: dib FINDINGS: LUNGS AND PLEURA: Mildly lower lung volumes. No focal pulmonary opacity. No pleural effusion. No pneumothorax. HEART AND MEDIASTINUM: No acute abnormality of the cardiac and mediastinal silhouettes. BONES AND SOFT TISSUES: No acute osseous abnormality. IMPRESSION: 1. No acute process. 2. Mildly lower lung volumes. Electronically signed by: Evalene Coho MD 10/22/2024 09:34 AM EST RP Workstation: [id number]   SignedBETHA Idelle Nakai, DO 10/28/2024, 11:42 AM   "

## 2024-10-28 NOTE — Progress Notes (Signed)
 Discharge instructions reviewed with pt.  Copy of instructions given to pt. Pt informed [facility] is filling his scripts and will be picked up on the way to the discharge lounge or they will be delivered to the discharge lounge to him once all are filled. Pt has asked for his rescue inhaler (MDI) to be filled, his is out at home. Pt's primary nurse has already reached out to the MD, still waiting for them to add this to his pharmacy scripts to be filled. Primary nurse to reach out to the MD again.  Pt is finishing his lunch and getting dressed. Pt will go down to the discharge lounge once dressed and MD has put in order to have his MDI filled by [facility]. Pt's ride coming around 3pm he states.   Madelin Barefoot, RN SWOT

## 2024-10-28 NOTE — TOC Transition Note (Signed)
 Transition of Care (TOC) - Discharge Note Rayfield Gobble RN, BSN Inpatient Care Management Unit 4E- RN Case Manager See Treatment Team for direct phone #   Patient Details  Name: Richard [name] MRN: [id number] Date of Birth: August 09, 1961  Transition of Care [facility]) CM/SW Contact:  Gobble Rayfield Hurst, RN Phone Number: 10/28/2024, 12:10 PM   Clinical Narrative:    Pt stable for transition home today. CM spoke with pt at bedside to discuss outpt therapy. Per pt he is already doing outpt PT at Orth[facility] after recent knee replacement. Pt voiced he will continue outpt PT with Orth[facility].   Confirmed pt has needed DME-  No further ICM needs noted.      Final next level of care: OP Rehab Barriers to Discharge: Barriers Resolved   Patient Goals and CMS Choice Patient states their goals for this hospitalization and ongoing recovery are:: return home   Choice offered to / list presented to : NA      Discharge Placement               Home        Discharge Plan and Services Additional resources added to the After Visit Summary for     Discharge Planning Services: CM Consult Post Acute Care Choice: NA          DME Arranged: N/A DME Agency: NA       HH Arranged: NA HH Agency: NA        Social Drivers of Health (SDOH) Interventions SDOH Screenings   Food Insecurity: No Food Insecurity (10/22/2024)  Housing: Low Risk (10/22/2024)  Transportation Needs: No Transportation Needs (10/22/2024)  Utilities: Not At Risk (10/22/2024)  Alcohol  Screen: Low Risk (09/25/2023)  Depression (PHQ2-9): Low Risk (08/03/2024)  Financial Resource Strain: Low Risk (09/25/2023)  Physical Activity: Sufficiently Active (09/25/2023)  Social Connections: Socially Integrated (10/22/2024)  Recent Concern: Social Connections - Moderately Isolated (08/24/2024)  Stress: No Stress Concern Present (09/25/2023)  Tobacco Use: Medium Risk (10/27/2024)  Health Literacy: Adequate Health Literacy (09/25/2023)      Readmission Risk Interventions    10/28/2024   12:10 PM 04/01/2024   11:49 AM 09/06/2023   10:25 AM  Readmission Risk Prevention Plan  Post Dischage Appt   Complete  Medication Screening   Complete  Transportation Screening Complete Complete Complete  PCP or Specialist Appt within 5-7 Days Complete    Home Care Screening Complete Complete   Medication Review (RN CM) Complete Complete

## 2024-10-29 ENCOUNTER — Encounter (HOSPITAL_COMMUNITY): Payer: Self-pay | Admitting: Pediatrics

## 2024-10-29 ENCOUNTER — Ambulatory Visit: Admitting: Podiatry

## 2024-10-29 ENCOUNTER — Telehealth: Payer: Self-pay

## 2024-10-29 DIAGNOSIS — B351 Tinea unguium: Secondary | ICD-10-CM | POA: Diagnosis not present

## 2024-10-29 DIAGNOSIS — M79674 Pain in right toe(s): Secondary | ICD-10-CM

## 2024-10-29 DIAGNOSIS — M79675 Pain in left toe(s): Secondary | ICD-10-CM | POA: Diagnosis not present

## 2024-10-29 NOTE — Progress Notes (Signed)
" °  Progress Note   Date: 10/27/2024  Patient Name: Richard [name]        MRN#: [id number]  Review the patients clinical findings supports the diagnosis of:   Diabetes mellitus type 2 with hyperglycemia     "

## 2024-10-29 NOTE — Transitions of Care (Post Inpatient/ED Visit) (Signed)
 "  10/29/2024  Name: Richard [name] MRN: [id number] DOB: [birth year]  Today's TOC FU Call Status: Today's TOC FU Call Status:: Successful TOC FU Call Completed TOC FU Call Complete Date: 10/29/24  Patient's Name and Date of Birth confirmed. Name, DOB  Transition Care Management Follow-up Telephone Call Date of Discharge: 10/28/24 Discharge Facility: [facility]) Type of Discharge: Inpatient Admission Primary Inpatient Discharge Diagnosis:: COPD and Anemia How have you been since you were released from the hospital?: Better Any questions or concerns?: No  Items Reviewed: Did you receive and understand the discharge instructions provided?: Yes Medications obtained,verified, and reconciled?: Yes (Medications Reviewed) Any new allergies since your discharge?: No Dietary orders reviewed?: NA Do you have support at home?: Yes People in Home [RPT]: significant other Name of Support/Comfort Primary Source: Roxanne Higgs, SO  Medications Reviewed Today: Medications Reviewed Today     Reviewed by Moises Reusing, RN (Case Manager) on 10/29/24 at 1055  Med List Status: <None>   Medication Order Taking? Sig Documenting Provider Last Dose Status Informant  Accu-Chek Softclix Lancets lancets [id number] No 1 each by Other route See admin instructions. 1-2 times daily [provider] Unknown Active Self, Pharmacy Records  albuterol  (PROVENTIL ) (2.5 MG/3ML) 0.083% nebulizer solution [id number] No Take 2.5 mg by nebulization in the morning, at noon, and at bedtime. [provider] 10/21/2024 Active Self, Pharmacy Records           Med Note HAROLDINE, LUKE A   Thu Oct 22, 2024  3:27 PM) Recently PRN --> TID  albuterol  (VENTOLIN  HFA) 108 (90 Base) MCG/ACT inhaler [id number]  Inhale 1-2 puffs into the lungs every 4 (four) hours as needed for wheezing or shortness of breath. Idelle Nakai, DO  Active   atorvastatin  (LIPITOR) 40 MG tablet [id number] No Take 1 tablet (40 mg total) by  mouth daily.  Patient taking differently: Take 40 mg by mouth at bedtime.   Amilibia, [name], DO 10/21/2024 Active Self, Pharmacy Records  Budeson-Glycopyrrol-Formoterol  (BREZTRI  AEROSPHERE) 160-9-4.8 MCG/ACT AERO [id number] No Inhale 2 puffs into the lungs in the morning and at bedtime. Gladis Leonor HERO, MD 10/22/2024 Active Self, Pharmacy Records  cetirizine  (ZYRTEC  ALLERGY) 10 MG tablet [id number] No Take 1 tablet (10 mg total) by mouth daily.  Patient taking differently: Take 10 mg by mouth daily as needed for allergies.   Kandis Perkins, DO Unknown Active Self, Pharmacy Records  clopidogrel  (PLAVIX ) 75 MG tablet [id number] No Take 1 tablet (75 mg total) by mouth daily.  Patient taking differently: Take 75 mg by mouth every evening.   Cockerham, Alicia M, PA-C 10/21/2024 Active Self, Pharmacy Records  Continuous Glucose Sensor (FREESTYLE LIBRE 3 PLUS SENSOR) MISC [id number] No Place 1 sensor on the skin every 15 days. Use to check glucose continuously. Rosan Dayton [name], DO Unknown Active Self, Pharmacy Records           Med Note HAROLDINE, LUKE A   Thu Oct 22, 2024  3:37 PM)    diphenhydrAMINE  (BENADRYL  ALLERGY) 25 mg capsule [id number] No Take 1 capsule (25 mg total) by mouth at bedtime as needed for allergies or itching. Kandis Perkins, DO Unknown Active Self, Pharmacy Records  Dulaglutide  (TRULICITY ) 4.5 MG/0.5ML EMMANUEL [id number]  Inject 4.5 mg as directed once a week on Fridays -- WAIT 2 WEEKS AFTER SURGERY BEFORE RESTARTING Idelle Nakai, DO  Active   DUPIXENT 300 MG/2ML SOAJ [id number] No Inject 300 mg into the skin every 14 (fourteen) days. [provider] Unknown Active Self, Pharmacy Records  Med Note HAROLDINE, LUKE A   Thu Oct 22, 2024  3:34 PM) Provider paused due to procedures, unknown if restarting  fluticasone  (FLONASE ) 50 MCG/ACT nasal spray [id number] No Place 1 spray into both nostrils as needed for allergies. Rosan Dayton [name], DO Unknown Active Self, Pharmacy Records   glucose blood test strip [id number] No 1 each by Other route in the morning and at bedtime. [provider] Unknown Active Self, Pharmacy Records  insulin  degludec (TRESIBA ) 100 UNIT/ML FlexTouch Pen [id number]  Inject 24 Units into the skin daily. Idelle Nakai, DO  Active   Insulin  Pen Needle (PEN NEEDLES) 32G X 4 MM MISC [id number] No Use 1 each daily as directed. Rosan Dayton [name], DO Unknown Active Self, Pharmacy Records  linaclotide  (LINZESS ) 290 MCG CAPS capsule [id number]  Take 1 capsule (290 mcg total) by mouth daily before breakfast. Faunce, Alina, DO  Active   montelukast  (SINGULAIR ) 10 MG tablet [id number] No Take 1 tablet (10 mg total) by mouth at bedtime. Rosan Dayton [name], DO 10/21/2024 Active Self, Pharmacy Records  pantoprazole  (PROTONIX ) 40 MG tablet [id number]  Take 1 tablet (40 mg total) by mouth daily. Idelle Nakai, DO  Active   pioglitazone  (ACTOS ) 30 MG tablet [id number] No Take 30 mg by mouth daily. [provider] 10/22/2024 Active Self, Pharmacy Records  pregabalin  (LYRICA ) 100 MG capsule [id number] No Take 1 capsule (100 mg total) by mouth 3 (three) times daily. Rosan Dayton [name], DO 10/22/2024 Active Self, Pharmacy Records  SYNJARDY  12.02-999 MG TABS [id number] No Take 1 tablet by mouth 2 (two) times daily. Amilibia, [name], DO 10/22/2024 Active Self, Pharmacy Records  tadalafil  (CIALIS ) 10 MG tablet [id number] No Take 1 tablet (10 mg total) by mouth as needed for erectile dysfunction. WAIT THREE DAYS FROM SURGERY BEFORE RESTARTING Renae Bernarda HERO, PA-C Unknown Active Self, Pharmacy Records  timolol  (TIMOPTIC ) 0.5 % ophthalmic solution [id number] No Place 1 drop into both eyes 2 (two) times daily. [provider] 10/22/2024 Active Self, Pharmacy Records  triamcinolone  cream (KENALOG ) 0.1 % [id number] No Apply 1 Application topically 2 (two) times daily. Use for 2 weeks on, 2 weeks off. Repeat PRN. Orman Erminio POUR, PA-C Unknown Active Self, Pharmacy Records             Home Care and Equipment/Supplies: Were Home Health Services Ordered?: No (Outpatient services) Any new equipment or medical supplies ordered?: No  Functional Questionnaire: Do you need assistance with bathing/showering or dressing?: No Do you need assistance with meal preparation?: No Do you need assistance with eating?: No Do you have difficulty maintaining continence: No Do you need assistance with getting out of bed/getting out of a chair/moving?: No Do you have difficulty managing or taking your medications?: No  Follow up appointments reviewed: PCP Follow-up appointment confirmed?: Yes (Message sent to [facility] to schedule) MD Provider Line [phone] Given: No Date of PCP follow-up appointment?: 11/16/24 Follow-up Provider: [place] Rosan Specialist [facility] Follow-up appointment confirmed?: Yes Date of Specialist follow-up appointment?: 11/18/24 Follow-Up Specialty Provider:: GI specialist Do you need transportation to your follow-up appointment?: No Do you understand care options if your condition(s) worsen?: Yes-patient verbalized understanding  SDOH Interventions Today    Flowsheet Row Most Recent Value  SDOH Interventions   Food Insecurity Interventions Intervention Not Indicated  Housing Interventions Intervention Not Indicated  Transportation Interventions Intervention Not Indicated  Utilities Interventions Intervention Not Indicated    Medford Balboa, BSN, RN [address]  [employer]BC[employer] - Population Health RN Care Manager [phone]  "

## 2024-10-29 NOTE — Progress Notes (Signed)
 Patient presents for evaluation and treatment of tenderness and some redness around nails feet.  Tenderness around toes with walking and wearing shoes.  Physical exam:  General appearance: Alert, pleasant, and in no acute distress.  Vascular: Pedal pulses: DP 2/4 B/L, PT 2/4 B/L. Mild edema lower legs bilaterally.  Capillary refill time immediate bilaterally  Neurologic:  Dermatologic:  Nails thickened, disfigured, discolored 1-5 BL with subungual debris.  Redness and hypertrophic nail folds along nail folds bilaterally but no signs of drainage or infection.  Musculoskeletal:  Status post transmetatarsal amputation left   Diagnosis: 1. Painful onychomycotic nails 1 through 5 right 2. Pain toes 1 through 5 right  Plan: -Debrided onychomycotic nails 1 through 5 bilaterally.  Sharply debrided nails with nail clipper and reduced with a power bur.  Return 3 months [facility]

## 2024-11-05 ENCOUNTER — Ambulatory Visit: Payer: Self-pay | Admitting: Gastroenterology

## 2024-11-06 ENCOUNTER — Telehealth (HOSPITAL_BASED_OUTPATIENT_CLINIC_OR_DEPARTMENT_OTHER): Payer: Self-pay | Admitting: *Deleted

## 2024-11-09 ENCOUNTER — Telehealth: Payer: Self-pay

## 2024-11-09 NOTE — Telephone Encounter (Signed)
 Pt returning call. Please advise.

## 2024-11-09 NOTE — Telephone Encounter (Signed)
"  °  Patient Consent for Virtual Visit        Richard [name] has provided verbal consent on 11/09/2024 for a virtual visit (video or telephone).   CONSENT FOR VIRTUAL VISIT FOR:  Richard [name]  By participating in this virtual visit I agree to the following:  I hereby voluntarily request, consent and authorize [facility] and its employed or contracted physicians, physician assistants, nurse practitioners or other licensed health care professionals (the Practitioner), to provide me with telemedicine health care services (the Services) as deemed necessary by the treating Practitioner. I acknowledge and consent to receive the Services by the Practitioner via telemedicine. I understand that the telemedicine visit will involve communicating with the Practitioner through live audiovisual communication technology and the disclosure of certain medical information by electronic transmission. I acknowledge that I have been given the opportunity to request an in-person assessment or other available alternative prior to the telemedicine visit and am voluntarily participating in the telemedicine visit.  I understand that I have the right to withhold or withdraw my consent to the use of telemedicine in the course of my care at any time, without affecting my right to future care or treatment, and that the Practitioner or I may terminate the telemedicine visit at any time. I understand that I have the right to inspect all information obtained and/or recorded in the course of the telemedicine visit and may receive copies of available information for a reasonable fee.  I understand that some of the potential risks of receiving the Services via telemedicine include:  Delay or interruption in medical evaluation due to technological equipment failure or disruption; Information transmitted may not be sufficient (e.g. poor resolution of images) to allow for appropriate medical decision making by the  Practitioner; and/or  In rare instances, security protocols could fail, causing a breach of personal health information.  Furthermore, I acknowledge that it is my responsibility to provide information about my medical history, conditions and care that is complete and accurate to the best of my ability. I acknowledge that Practitioner's advice, recommendations, and/or decision may be based on factors not within their control, such as incomplete or inaccurate data provided by me or distortions of diagnostic images or specimens that may result from electronic transmissions. I understand that the practice of medicine is not an exact science and that Practitioner makes no warranties or guarantees regarding treatment outcomes. I acknowledge that a copy of this consent can be made available to me via my patient portal [facility] MyChart), or I can request a printed copy by calling the office of [facility].    I understand that my insurance will be billed for this visit.   I have read or had this consent read to me. I understand the contents of this consent, which adequately explains the benefits and risks of the Services being provided via telemedicine.  I have been provided ample opportunity to ask questions regarding this consent and the Services and have had my questions answered to my satisfaction. I give my informed consent for the services to be provided through the use of telemedicine in my medical care    "

## 2024-11-09 NOTE — Telephone Encounter (Signed)
 Left message to call back to schedule tele pre op appt.

## 2024-11-09 NOTE — Telephone Encounter (Signed)
 Pt scheduled for 11/23/24 at 1:40pm

## 2024-11-11 ENCOUNTER — Telehealth: Payer: Self-pay | Admitting: Pulmonary Disease

## 2024-11-11 NOTE — Telephone Encounter (Signed)
 Fax received from Dr. Toribio Higashi with [facility] to perform a left total knee arthroplasty on patient.  Patient needs surgery clearance. Surgery is pending. Patient was seen on 08/20/25. Office protocol is a risk assessment can be sent to surgeon if patient has been seen in 60 days or less.    Pt is scheduled with Dr. Neda for 11/20/24- I have updated appt note to indicate need for risk assessment and routing back to clearance pool for now.

## 2024-11-12 ENCOUNTER — Other Ambulatory Visit: Payer: Self-pay

## 2024-11-13 ENCOUNTER — Ambulatory Visit (HOSPITAL_COMMUNITY): Payer: Self-pay

## 2024-11-13 ENCOUNTER — Other Ambulatory Visit (HOSPITAL_COMMUNITY): Payer: Self-pay

## 2024-11-13 DIAGNOSIS — I050 Rheumatic mitral stenosis: Secondary | ICD-10-CM

## 2024-11-13 LAB — ECHOCARDIOGRAM COMPLETE
AR max vel: 1.54 cm2
AV Area VTI: 1.47 cm2
AV Area mean vel: 1.5 cm2
AV Mean grad: 20 mmHg
AV Peak grad: 36 mmHg
Ao pk vel: 3 m/s
Area-P 1/2: 3.7 cm2
MV VTI: 2.06 cm2
S' Lateral: 3.7 cm

## 2024-11-16 ENCOUNTER — Ambulatory Visit: Payer: Self-pay | Admitting: Internal Medicine

## 2024-11-18 ENCOUNTER — Ambulatory Visit: Admitting: Gastroenterology

## 2024-11-20 ENCOUNTER — Ambulatory Visit: Admitting: Pulmonary Disease

## 2024-11-23 ENCOUNTER — Ambulatory Visit

## 2024-11-30 ENCOUNTER — Ambulatory Visit: Admitting: Physician Assistant

## 2024-12-02 ENCOUNTER — Ambulatory Visit

## 2025-01-27 ENCOUNTER — Ambulatory Visit: Admitting: Podiatry
